# Patient Record
Sex: Male | Born: 1970 | Race: White | Hispanic: No | Smoking: Former smoker
Health system: Southern US, Community
[De-identification: ages and names within clinical notes are randomized; demographics above are authoritative.]

## PROBLEM LIST (undated history)

## (undated) DIAGNOSIS — R0600 Dyspnea, unspecified: Secondary | ICD-10-CM

## (undated) DIAGNOSIS — J189 Pneumonia, unspecified organism: Secondary | ICD-10-CM

## (undated) DIAGNOSIS — I2510 Atherosclerotic heart disease of native coronary artery without angina pectoris: Secondary | ICD-10-CM

## (undated) DIAGNOSIS — I712 Thoracic aortic aneurysm, without rupture: Secondary | ICD-10-CM

## (undated) DIAGNOSIS — I509 Heart failure, unspecified: Secondary | ICD-10-CM

## (undated) DIAGNOSIS — I7121 Aneurysm of the ascending aorta, without rupture: Secondary | ICD-10-CM

## (undated) DIAGNOSIS — F32A Depression, unspecified: Secondary | ICD-10-CM

## (undated) DIAGNOSIS — D649 Anemia, unspecified: Secondary | ICD-10-CM

## (undated) DIAGNOSIS — Z953 Presence of xenogenic heart valve: Secondary | ICD-10-CM

## (undated) DIAGNOSIS — I5032 Chronic diastolic (congestive) heart failure: Secondary | ICD-10-CM

## (undated) DIAGNOSIS — R7303 Prediabetes: Secondary | ICD-10-CM

## (undated) DIAGNOSIS — Z95828 Presence of other vascular implants and grafts: Secondary | ICD-10-CM

## (undated) DIAGNOSIS — G4730 Sleep apnea, unspecified: Secondary | ICD-10-CM

## (undated) DIAGNOSIS — N419 Inflammatory disease of prostate, unspecified: Secondary | ICD-10-CM

## (undated) DIAGNOSIS — F419 Anxiety disorder, unspecified: Secondary | ICD-10-CM

## (undated) DIAGNOSIS — M109 Gout, unspecified: Secondary | ICD-10-CM

## (undated) DIAGNOSIS — J449 Chronic obstructive pulmonary disease, unspecified: Secondary | ICD-10-CM

## (undated) DIAGNOSIS — I10 Essential (primary) hypertension: Secondary | ICD-10-CM

## (undated) DIAGNOSIS — I872 Venous insufficiency (chronic) (peripheral): Secondary | ICD-10-CM

## (undated) DIAGNOSIS — I272 Pulmonary hypertension, unspecified: Secondary | ICD-10-CM

## (undated) DIAGNOSIS — E669 Obesity, unspecified: Secondary | ICD-10-CM

## (undated) DIAGNOSIS — E119 Type 2 diabetes mellitus without complications: Secondary | ICD-10-CM

## (undated) DIAGNOSIS — I2699 Other pulmonary embolism without acute cor pulmonale: Secondary | ICD-10-CM

## (undated) DIAGNOSIS — J45909 Unspecified asthma, uncomplicated: Secondary | ICD-10-CM

## (undated) DIAGNOSIS — I350 Nonrheumatic aortic (valve) stenosis: Secondary | ICD-10-CM

## (undated) DIAGNOSIS — M549 Dorsalgia, unspecified: Secondary | ICD-10-CM

## (undated) DIAGNOSIS — L0390 Cellulitis, unspecified: Secondary | ICD-10-CM

## (undated) DIAGNOSIS — Z720 Tobacco use: Secondary | ICD-10-CM

## (undated) DIAGNOSIS — F329 Major depressive disorder, single episode, unspecified: Secondary | ICD-10-CM

## (undated) DIAGNOSIS — K5900 Constipation, unspecified: Secondary | ICD-10-CM

## (undated) DIAGNOSIS — R011 Cardiac murmur, unspecified: Secondary | ICD-10-CM

## (undated) DIAGNOSIS — N12 Tubulo-interstitial nephritis, not specified as acute or chronic: Secondary | ICD-10-CM

## (undated) DIAGNOSIS — M1990 Unspecified osteoarthritis, unspecified site: Secondary | ICD-10-CM

## (undated) HISTORY — DX: Chronic obstructive pulmonary disease, unspecified: J44.9

## (undated) HISTORY — DX: Essential (primary) hypertension: I10

## (undated) HISTORY — DX: Nonrheumatic aortic (valve) stenosis: I35.0

## (undated) HISTORY — PX: CARDIAC VALVE REPLACEMENT: SHX585

## (undated) HISTORY — DX: Tobacco use: Z72.0

## (undated) HISTORY — DX: Dorsalgia, unspecified: M54.9

## (undated) HISTORY — DX: Venous insufficiency (chronic) (peripheral): I87.2

## (undated) HISTORY — DX: Gout, unspecified: M10.9

## (undated) HISTORY — DX: Inflammatory disease of prostate, unspecified: N41.9

## (undated) HISTORY — PX: NO PAST SURGERIES: SHX2092

## (undated) HISTORY — DX: Constipation, unspecified: K59.00

## (undated) HISTORY — DX: Cellulitis, unspecified: L03.90

## (undated) HISTORY — DX: Thoracic aortic aneurysm, without rupture: I71.2

## (undated) HISTORY — DX: Heart failure, unspecified: I50.9

## (undated) HISTORY — DX: Obesity, unspecified: E66.9

## (undated) HISTORY — DX: Aneurysm of the ascending aorta, without rupture: I71.21

## (undated) HISTORY — DX: Chronic diastolic (congestive) heart failure: I50.32

## (undated) HISTORY — DX: Morbid (severe) obesity due to excess calories: E66.01

---

## 2008-01-08 ENCOUNTER — Emergency Department (HOSPITAL_COMMUNITY): Admission: EM | Admit: 2008-01-08 | Discharge: 2008-01-08 | Payer: Self-pay | Admitting: Emergency Medicine

## 2017-05-09 ENCOUNTER — Encounter: Payer: Self-pay | Admitting: *Deleted

## 2017-05-10 ENCOUNTER — Ambulatory Visit (INDEPENDENT_AMBULATORY_CARE_PROVIDER_SITE_OTHER): Payer: BLUE CROSS/BLUE SHIELD | Admitting: Cardiovascular Disease

## 2017-05-10 ENCOUNTER — Telehealth: Payer: Self-pay | Admitting: Cardiovascular Disease

## 2017-05-10 ENCOUNTER — Encounter: Payer: Self-pay | Admitting: Cardiovascular Disease

## 2017-05-10 ENCOUNTER — Other Ambulatory Visit: Payer: Self-pay

## 2017-05-10 VITALS — BP 180/80 | HR 80 | Ht 71.0 in | Wt >= 6400 oz

## 2017-05-10 DIAGNOSIS — R0789 Other chest pain: Secondary | ICD-10-CM

## 2017-05-10 DIAGNOSIS — I350 Nonrheumatic aortic (valve) stenosis: Secondary | ICD-10-CM

## 2017-05-10 DIAGNOSIS — I10 Essential (primary) hypertension: Secondary | ICD-10-CM | POA: Diagnosis not present

## 2017-05-10 DIAGNOSIS — R011 Cardiac murmur, unspecified: Secondary | ICD-10-CM

## 2017-05-10 DIAGNOSIS — Z720 Tobacco use: Secondary | ICD-10-CM

## 2017-05-10 DIAGNOSIS — I509 Heart failure, unspecified: Secondary | ICD-10-CM | POA: Diagnosis not present

## 2017-05-10 DIAGNOSIS — R0609 Other forms of dyspnea: Secondary | ICD-10-CM

## 2017-05-10 NOTE — Telephone Encounter (Signed)
Echo with contrast at [facility] - Dr. Raliegh Ip request that Reginald [name] does the test. Schedule at [facility]

## 2017-05-10 NOTE — Patient Instructions (Signed)
Medication Instructions:  Continue all current medications.  Labwork: none  Testing/Procedures:  Your physician has requested that you have an echocardiogram. Echocardiography is a painless test that uses sound waves to create images of your heart. It provides your doctor with information about the size and shape of your heart and how well your heart's chambers and valves are working. This procedure takes approximately one hour. There are no restrictions for this procedure. (with contrast)  Office will contact with results via phone or letter.    Follow-Up: 1 month   Any Other Special Instructions Will Be Listed Below (If Applicable).  If you need a refill on your cardiac medications before your next appointment, please call your pharmacy.

## 2017-05-10 NOTE — Progress Notes (Signed)
CARDIOLOGY CONSULT NOTE  Patient ID: ALAKAI MACBRIDE MRN: [id number] DOB/AGE: 46/26/1972 46 y.o.  Admit date: (Not on file) Primary Physician: Monico Blitz, MD Referring Physician: Dr. Manuella Ghazi  Reason for Consultation: Shortness of breath  HPI: Reginald [name] is a 46 y.o. male who is being seen today for the evaluation of shortness of breath at the request of Monico Blitz, MD.   Past medical history includes hypertension and tobacco abuse.  I personally reviewed all relevant documentation, labs, and studies from his PCP.  Echocardiogram performed at an outside facility on 02/19/17 was reportedly a technically difficult study.  It was felt secondary to morbid obesity.  ECG performed on 02/12/17 which I personally interpreted demonstrated sinus rhythm with mild inferior nonspecific T wave abnormalities.  He said he has been short of breath for years.  He has been treated with antibiotics and steroids for COPD.  He said he does not get much relief from this.  He was then placed on a diuretic twice daily and did feel relief about 2 or 3 months ago.  Lately he has not experienced relief with a once daily diuretic.  He has had left leg swelling for the past 2 years.  He said if he does not wear compression stockings and uses diuretic, his left leg become significantly swollen.  He has had some chest pressure this week.  He said he feels like "there is gas in my chest ".  He experiences occasional relief with his inhaler.  He has been on prednisone for the past week.  He said his blood pressure is normally less than 140/90.  It is 180/80 today.  After auscultating him, I asked him if he knew he had a heart murmur.  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  Family history: Brother had a coronary artery stent in his late 61s or early 68s.  His mother had an MI at age 48.   No Known Allergies  Current Outpatient Medications    Medication Sig Dispense Refill  . albuterol (PROVENTIL HFA;VENTOLIN HFA) 108 (90 Base) MCG/ACT inhaler Inhale into the lungs every 6 (six) hours as needed for wheezing or shortness of breath.    . allopurinol (ZYLOPRIM) 300 MG tablet Take 300 mg by mouth daily.    Marland Kitchen amLODipine (NORVASC) 10 MG tablet Take 10 mg by mouth daily.    Marland Kitchen amoxicillin-clavulanate (AUGMENTIN) 875-125 MG tablet Take 1 tablet by mouth 2 (two) times daily.    Marland Kitchen docusate sodium (COLACE) 100 MG capsule Take 100 mg by mouth 2 (two) times daily.    . furosemide (LASIX) 40 MG tablet Take 40 mg by mouth daily.    Marland Kitchen ibuprofen (ADVIL,MOTRIN) 800 MG tablet Take 800 mg by mouth every 8 (eight) hours as needed.    . IPRATROPIUM BROMIDE IN Inhale into the lungs.    Marland Kitchen lisinopril-hydrochlorothiazide (PRINZIDE,ZESTORETIC) 20-25 MG tablet Take 1 tablet by mouth daily.    . potassium chloride (K-DUR,KLOR-CON) 10 MEQ tablet Take 10 mEq by mouth daily.    . predniSONE (DELTASONE) 20 MG tablet Take 20 mg by mouth daily with breakfast.     No current facility-administered medications for this visit.     Past Medical History:  Diagnosis Date  . Back pain   . Cellulitis of skin with lymphangitis   . CHF (congestive heart failure) ([place])   . Constipation   .  COPD (chronic obstructive pulmonary disease) ([place])   . Gout   . Hypertension   . Obesity   . Prostatitis      Social History   Socioeconomic History  . Marital status: Single    Spouse name: Not on file  . Number of children: Not on file  . Years of education: Not on file  . Highest education level: Not on file  Social Needs  . Financial resource strain: Not on file  . Food insecurity - worry: Not on file  . Food insecurity - inability: Not on file  . Transportation needs - medical: Not on file  . Transportation needs - non-medical: Not on file  Occupational History  . Not on file  Tobacco Use  . Smoking status: Current Every Day Smoker    Packs/day: 1.00    Types:  Cigarettes  . Smokeless tobacco: Never Used  Substance and Sexual Activity  . Alcohol use: Not on file  . Drug use: Not on file  . Sexual activity: Not on file  Other Topics Concern  . Not on file  Social History Narrative  . Not on file      Current Meds  Medication Sig  . albuterol (PROVENTIL HFA;VENTOLIN HFA) 108 (90 Base) MCG/ACT inhaler Inhale into the lungs every 6 (six) hours as needed for wheezing or shortness of breath.  . allopurinol (ZYLOPRIM) 300 MG tablet Take 300 mg by mouth daily.  Marland Kitchen amLODipine (NORVASC) 10 MG tablet Take 10 mg by mouth daily.  Marland Kitchen amoxicillin-clavulanate (AUGMENTIN) 875-125 MG tablet Take 1 tablet by mouth 2 (two) times daily.  Marland Kitchen docusate sodium (COLACE) 100 MG capsule Take 100 mg by mouth 2 (two) times daily.  . furosemide (LASIX) 40 MG tablet Take 40 mg by mouth daily.  Marland Kitchen ibuprofen (ADVIL,MOTRIN) 800 MG tablet Take 800 mg by mouth every 8 (eight) hours as needed.  . IPRATROPIUM BROMIDE IN Inhale into the lungs.  Marland Kitchen lisinopril-hydrochlorothiazide (PRINZIDE,ZESTORETIC) 20-25 MG tablet Take 1 tablet by mouth daily.  . potassium chloride (K-DUR,KLOR-CON) 10 MEQ tablet Take 10 mEq by mouth daily.  . predniSONE (DELTASONE) 20 MG tablet Take 20 mg by mouth daily with breakfast.  . [DISCONTINUED] testosterone cypionate (DEPOTESTOSTERONE CYPIONATE) 200 MG/ML injection Inject 200 mg into the muscle every 14 (fourteen) days.      Review of systems complete and found to be negative unless listed above in HPI    Physical exam Blood pressure (!) 180/80, pulse 80, height 5\' 11"  (1.803 m), weight (!) 442 lb (200.5 kg), SpO2 98 %. General: NAD Neck: No JVD, no thyromegaly or thyroid nodule.  Lungs: Clear to auscultation bilaterally with normal respiratory effort. CV: Nondisplaced PMI. Regular rate and rhythm, normal S1/S2, no S3/S4, 3/6 ejection systolic murmur loudest over right upper sternal border.  Wearing compression stockings.    Abdomen: Morbidly  obese Skin: Intact without lesions or rashes.  Neurologic: Alert and oriented x 3.  Psych: Normal affect. Extremities: No clubbing or cyanosis.  HEENT: Normal.   ECG: Most recent ECG reviewed.   Labs: No results found for: K, BUN, CREATININE, ALT, TSH, HGB   Lipids: No results found for: LDLCALC, LDLDIRECT, CHOL, TRIG, HDL      ASSESSMENT AND PLAN:  1.  Progressive exertional dyspnea and chest pressure: I am sure he has some degree of COPD due to years of tobacco abuse as well as restrictive lung disease due to morbid obesity, which is contributing to his symptomatology. However, he also has  a loud murmur over the aortic area.  Upon further discussing with him, he may have congenital bicuspid aortic valve stenosis.  I will obtain an echocardiogram with contrast for further clarification.  I would also like to assess aortic root size given its known association with aortopathy.   Given progressive relief with diuretic use in the past, he likely has some component of CHF.  I will need to determine if this is systolic or diastolic in etiology. He has several risk factors for ischemic heart disease.  I talked to him about obtaining a CT of the coronary arteries with fractional flow reserve.  He is concerned about cost and would like to wait until February 1.  2.  Essential hypertension: Blood pressure is markedly elevated but he says it is usually less than 140/90.  There is likely some contribution from prednisone.  I will monitor this.  3.  Cardiac murmur: See #1.  4.  Tobacco abuse disorder.  5. CHF: Given progressive relief with diuretic use in the past, he likely has some component of CHF.  I will need to determine if this is systolic or diastolic in etiology.    Disposition: Follow up in 1 month  Signed: Kate Sable, M.D., F.A.C.C.  05/10/2017, 10:08 AM

## 2017-05-25 ENCOUNTER — Ambulatory Visit (HOSPITAL_COMMUNITY)
Admission: RE | Admit: 2017-05-25 | Discharge: 2017-05-25 | Disposition: A | Discharge status: Home / Self Care | Payer: BLUE CROSS/BLUE SHIELD | Source: Ambulatory Visit | Attending: Cardiovascular Disease | Admitting: Cardiovascular Disease

## 2017-05-25 DIAGNOSIS — I110 Hypertensive heart disease with heart failure: Secondary | ICD-10-CM | POA: Diagnosis not present

## 2017-05-25 DIAGNOSIS — F17210 Nicotine dependence, cigarettes, uncomplicated: Secondary | ICD-10-CM | POA: Diagnosis not present

## 2017-05-25 DIAGNOSIS — J449 Chronic obstructive pulmonary disease, unspecified: Secondary | ICD-10-CM | POA: Diagnosis not present

## 2017-05-25 DIAGNOSIS — I350 Nonrheumatic aortic (valve) stenosis: Secondary | ICD-10-CM | POA: Diagnosis not present

## 2017-05-25 DIAGNOSIS — I509 Heart failure, unspecified: Secondary | ICD-10-CM | POA: Diagnosis not present

## 2017-05-25 LAB — ECHOCARDIOGRAM COMPLETE
AO mean calculated velocity dopler: 327 cm/s
AOPV: 0.31 m/s
AOVTI: 90.2 cm
AV Area VTI index: 0.36 cm2/m2
AV Area VTI: 1.19 cm2
AV Area mean vel: 0.96 cm2
AV Peak grad: 75 mmHg
AV VEL mean LVOT/AV: 0.28
AV vel: 1.19
AVAREAMEANVIN: 0.29 cm2/m2
AVG: 48 mmHg
AVLVOTPG: 7 mmHg
AVPKVEL: 434 cm/s
CHL CUP AV PEAK INDEX: 0.36
E decel time: 222 msec
E/e' ratio: 12.45
FS: 41 % (ref 28–44)
IV/PV OW: 0.82
LA diam end sys: 43 mm
LA vol: 95.7 mL
LADIAMINDEX: 1.31 cm/m2
LASIZE: 43 mm
LAVOLA4C: 79.5 mL
LAVOLIN: 29.1 mL/m2
LV E/e' medial: 12.45
LV E/e'average: 12.45
LV PW d: 15.4 mm — AB (ref 0.6–1.1)
LVELAT: 8.27 cm/s
LVOT VTI: 28.3 cm
LVOT area: 3.46 cm2
LVOT diameter: 21 mm
LVOTPV: 136 cm/s
LVOTSV: 108 mL
LVOTVTI: 0.31 cm
Lateral S' vel: 17.4 cm/s
MV Dec: 222
MV Peak grad: 4 mmHg
MV pk E vel: 103 m/s
MVPKAVEL: 106 m/s
RV TAPSE: 31.9 mm
TDI e' lateral: 8.27
TDI e' medial: 7.72
Valve area index: 0.36
Valve area: 1.19 cm2

## 2017-05-25 MED ORDER — PERFLUTREN LIPID MICROSPHERE
1.0000 mL | INTRAVENOUS | Status: AC | PRN
  Administered 2017-05-25: 2 mL via INTRAVENOUS
  Administered 2017-05-25: 1 mL via INTRAVENOUS
  Filled 2017-05-25: qty 10

## 2017-05-25 NOTE — Progress Notes (Signed)
*  PRELIMINARY RESULTS* Echocardiogram 2D Echocardiogram with DEFINITY has been performed.  Leavy Cella 05/25/2017, 11:42 AM

## 2017-06-04 ENCOUNTER — Telehealth: Payer: Self-pay | Admitting: *Deleted

## 2017-06-04 NOTE — Telephone Encounter (Signed)
Notes recorded by Laurine Blazer, LPN on 2/48/2500 at 3:70 PM EST Patient notified. Copy to pmd. Follow up scheduled for 06/19/2017 with Dr. Bronson Ing.   ------  Notes recorded by Laurine Blazer, LPN on 4/88/8916 at 9:45 PM EST Left message to return call.  ------  Notes recorded by Laurine Blazer, LPN on 0/07/8880 at 80:03 AM EST 06/19/2017. Is this soon enough? ------  Notes recorded by Herminio Commons, MD on 05/28/2017 at 9:10 AM EST Severe narrowing of aortic valve opening which is what I suspected when I examined him. When is his follow up with me scheduled?

## 2017-06-19 ENCOUNTER — Other Ambulatory Visit: Payer: Self-pay

## 2017-06-19 ENCOUNTER — Ambulatory Visit (INDEPENDENT_AMBULATORY_CARE_PROVIDER_SITE_OTHER): Payer: BLUE CROSS/BLUE SHIELD | Admitting: Cardiovascular Disease

## 2017-06-19 ENCOUNTER — Encounter: Payer: Self-pay | Admitting: *Deleted

## 2017-06-19 ENCOUNTER — Encounter: Payer: Self-pay | Admitting: Cardiovascular Disease

## 2017-06-19 VITALS — BP 130/80 | HR 89 | Ht 71.0 in | Wt >= 6400 oz

## 2017-06-19 DIAGNOSIS — Z720 Tobacco use: Secondary | ICD-10-CM

## 2017-06-19 DIAGNOSIS — R0609 Other forms of dyspnea: Secondary | ICD-10-CM

## 2017-06-19 DIAGNOSIS — R0789 Other chest pain: Secondary | ICD-10-CM | POA: Diagnosis not present

## 2017-06-19 DIAGNOSIS — I10 Essential (primary) hypertension: Secondary | ICD-10-CM

## 2017-06-19 DIAGNOSIS — I350 Nonrheumatic aortic (valve) stenosis: Secondary | ICD-10-CM | POA: Diagnosis not present

## 2017-06-19 DIAGNOSIS — I712 Thoracic aortic aneurysm, without rupture, unspecified: Secondary | ICD-10-CM

## 2017-06-19 MED ORDER — FUROSEMIDE 40 MG PO TABS: 40.0000 mg | ORAL_TABLET | Freq: Every day | ORAL | 1 refills | Status: DC

## 2017-06-19 NOTE — Progress Notes (Signed)
SUBJECTIVE: The patient presents for follow-up after undergoing cardiac testing for progressive exertional dyspnea and chest pressure.  He is morbidly obese.  He also has hypertension and a history of tobacco abuse.  Echocardiogram on 05/25/17 demonstrated normal left ventricular systolic and diastolic function with normal regional wall motion, LVEF 60-65%, moderate LVH, and severe aortic valvular stenosis, mean gradient 48 mmHg, valve area 0.99 cm, with severe dilatation of the proximal ascending aorta, 5.2 cm.  He has been using Lasix 40 mg daily but frequently has had to use the 80 mg for relief of shortness of breath and bilateral leg edema.  Continues to use compression stockings which has helped.  He has been tired and exhausted after day of work.  He drives a truck.  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  He did have palpitations today and attributes this to anxiety.  He has had occasional episodic dizziness but denies syncope.  He is very worried.   Family history: Brother had a coronary artery stent in his late 34s or early 55s.  His mother had an MI at age 45.  Review of Systems: As per "subjective", otherwise negative.  No Known Allergies  Current Outpatient Medications  Medication Sig Dispense Refill  . albuterol (PROVENTIL HFA;VENTOLIN HFA) 108 (90 Base) MCG/ACT inhaler Inhale into the lungs every 6 (six) hours as needed for wheezing or shortness of breath.    . allopurinol (ZYLOPRIM) 300 MG tablet Take 300 mg by mouth daily.    Marland Kitchen amLODipine (NORVASC) 10 MG tablet Take 10 mg by mouth daily.    Marland Kitchen docusate sodium (COLACE) 100 MG capsule Take 100 mg by mouth 2 (two) times daily.    . furosemide (LASIX) 40 MG tablet Take 40 mg by mouth daily.    Marland Kitchen ibuprofen (ADVIL,MOTRIN) 800 MG tablet Take 800 mg by mouth every 8 (eight) hours as needed.    . IPRATROPIUM BROMIDE IN Inhale into the lungs.    Marland Kitchen lisinopril-hydrochlorothiazide (PRINZIDE,ZESTORETIC) 20-25 MG tablet Take 1 tablet by  mouth daily.    . potassium chloride (K-DUR,KLOR-CON) 10 MEQ tablet Take 10 mEq by mouth daily.     No current facility-administered medications for this visit.     Past Medical History:  Diagnosis Date  . Back pain   . Cellulitis of skin with lymphangitis   . CHF (congestive heart failure) ([place])   . Constipation   . COPD (chronic obstructive pulmonary disease) ([place])   . Gout   . Hypertension   . Obesity   . Prostatitis     Past Surgical History:  Procedure Laterality Date  . NO PAST SURGERIES      Social History   Socioeconomic History  . Marital status: Single    Spouse name: Not on file  . Number of children: Not on file  . Years of education: Not on file  . Highest education level: Not on file  Social Needs  . Financial resource strain: Not on file  . Food insecurity - worry: Not on file  . Food insecurity - inability: Not on file  . Transportation needs - medical: Not on file  . Transportation needs - non-medical: Not on file  Occupational History  . Not on file  Tobacco Use  . Smoking status: Current Every Day Smoker    Packs/day: 1.00    Types: Cigarettes  . Smokeless tobacco: Never Used  Substance and Sexual Activity  . Alcohol use: Not on file  . Drug use:  Not on file  . Sexual activity: Not on file  Other Topics Concern  . Not on file  Social History Narrative  . Not on file     Vitals:   06/19/17 1543  BP: 130/80  Pulse: 89  SpO2: 97%  Weight: (!) 445 lb (201.9 kg)  Height: 5\' 11"  (1.803 m)    Wt Readings from Last 3 Encounters:  06/19/17 (!) 445 lb (201.9 kg)  05/10/17 (!) 442 lb (200.5 kg)  04/23/17 (!) 443 lb (200.9 kg)     PHYSICAL EXAM General: NAD HEENT: Normal. Neck: No JVD, no thyromegaly. Lungs: Diminished sounds throughout, no crackles or wheezes. CV: Regular rate and rhythm, normal S1/diminished S2, no S3/S4, 3/6 ejection systolic murmur loudest over right upper sternal border.  Wearing compression stockings.  No  significant bilateral leg edema.  Abdomen: Morbidly obese. Neurologic: Alert and oriented.  Psych: Normal affect.  Anxious. Skin: Normal. Musculoskeletal: No gross deformities.    ECG: Most recent ECG reviewed.   Labs: No results found for: K, BUN, CREATININE, ALT, TSH, HGB   Lipids: No results found for: LDLCALC, LDLDIRECT, CHOL, TRIG, HDL     ASSESSMENT AND PLAN: 1.  Severe symptomatic aortic stenosis: I suspect he has congenital bicuspid aortic valve stenosis.  He will require aortic valve replacement.  Given his morbid obesity and probable concomitant lung disease given his long history of tobacco abuse, I am uncertain if he would be an optimal candidate for open surgical valve replacement.  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  I will have him evaluated by CT surgery.   He will need pulmonary function testing and right and left heart catheterization with coronary angiography.  This will be deferred until he has been evaluated by CT surgery. He is currently on Lasix 40 mg daily and frequently takes 80 mg.  Both systolic and diastolic function were normal as detailed above. I will provide him with a letter keeping him off of work until the situation is resolved.  2.  Severe proximal ascending aortic dilatation: I will have him evaluated by CT surgery.  See discussion under #1.  3.  Chronic hypertension with hypertensive heart disease: Blood pressure is controlled.  No changes.  Currently on amlodipine 10 mg, lisinopril 20 mg, hydrochlorothiazide 25 mg, and Lasix 40 mg daily.  4.  Tobacco abuse disorder.     Disposition: Follow up with CT surgery in the near future.  Follow-up with me in 3 months.  Time spent: 40 minutes, of which greater than 50% was spent reviewing symptoms, relevant blood tests and studies, and discussing  management plan with the patient.   Time spent: 40 minutes, of which greater than 50% was spent reviewing symptoms, relevant blood tests and studies, and discussing management plan with the patient.    Kate Sable, M.D., F.A.C.C.

## 2017-06-19 NOTE — H&P (View-Only) (Signed)
SUBJECTIVE: The patient presents for follow-up after undergoing cardiac testing for progressive exertional dyspnea and chest pressure.  He is morbidly obese.  He also has hypertension and a history of tobacco abuse.  Echocardiogram on 05/25/17 demonstrated normal left ventricular systolic and diastolic function with normal regional wall motion, LVEF 60-65%, moderate LVH, and severe aortic valvular stenosis, mean gradient 48 mmHg, valve area 0.99 cm, with severe dilatation of the proximal ascending aorta, 5.2 cm.  He has been using Lasix 40 mg daily but frequently has had to use the 80 mg for relief of shortness of breath and bilateral leg edema.  Continues to use compression stockings which has helped.  He has been tired and exhausted after day of work.  He drives a truck.  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  He did have palpitations today and attributes this to anxiety.  He has had occasional episodic dizziness but denies syncope.  He is very worried.   Family history: Brother had a coronary artery stent in his late 22s or early 58s.  His mother had an MI at age 47.  Review of Systems: As per "subjective", otherwise negative.  No Known Allergies  Current Outpatient Medications  Medication Sig Dispense Refill  . albuterol (PROVENTIL HFA;VENTOLIN HFA) 108 (90 Base) MCG/ACT inhaler Inhale into the lungs every 6 (six) hours as needed for wheezing or shortness of breath.    . allopurinol (ZYLOPRIM) 300 MG tablet Take 300 mg by mouth daily.    Marland Kitchen amLODipine (NORVASC) 10 MG tablet Take 10 mg by mouth daily.    Marland Kitchen docusate sodium (COLACE) 100 MG capsule Take 100 mg by mouth 2 (two) times daily.    . furosemide (LASIX) 40 MG tablet Take 40 mg by mouth daily.    Marland Kitchen ibuprofen (ADVIL,MOTRIN) 800 MG tablet Take 800 mg by mouth every 8 (eight) hours as needed.    . IPRATROPIUM BROMIDE IN Inhale into the lungs.    Marland Kitchen lisinopril-hydrochlorothiazide (PRINZIDE,ZESTORETIC) 20-25 MG tablet Take 1 tablet by  mouth daily.    . potassium chloride (K-DUR,KLOR-CON) 10 MEQ tablet Take 10 mEq by mouth daily.     No current facility-administered medications for this visit.     Past Medical History:  Diagnosis Date  . Back pain   . Cellulitis of skin with lymphangitis   . CHF (congestive heart failure) ([place])   . Constipation   . COPD (chronic obstructive pulmonary disease) ([place])   . Gout   . Hypertension   . Obesity   . Prostatitis     Past Surgical History:  Procedure Laterality Date  . NO PAST SURGERIES      Social History   Socioeconomic History  . Marital status: Single    Spouse name: Not on file  . Number of children: Not on file  . Years of education: Not on file  . Highest education level: Not on file  Social Needs  . Financial resource strain: Not on file  . Food insecurity - worry: Not on file  . Food insecurity - inability: Not on file  . Transportation needs - medical: Not on file  . Transportation needs - non-medical: Not on file  Occupational History  . Not on file  Tobacco Use  . Smoking status: Current Every Day Smoker    Packs/day: 1.00    Types: Cigarettes  . Smokeless tobacco: Never Used  Substance and Sexual Activity  . Alcohol use: Not on file  . Drug use:  Not on file  . Sexual activity: Not on file  Other Topics Concern  . Not on file  Social History Narrative  . Not on file     Vitals:   06/19/17 1543  BP: 130/80  Pulse: 89  SpO2: 97%  Weight: (!) 445 lb (201.9 kg)  Height: 5\' 11"  (1.803 m)    Wt Readings from Last 3 Encounters:  06/19/17 (!) 445 lb (201.9 kg)  05/10/17 (!) 442 lb (200.5 kg)  04/23/17 (!) 443 lb (200.9 kg)     PHYSICAL EXAM General: NAD HEENT: Normal. Neck: No JVD, no thyromegaly. Lungs: Diminished sounds throughout, no crackles or wheezes. CV: Regular rate and rhythm, normal S1/diminished S2, no S3/S4, 3/6 ejection systolic murmur loudest over right upper sternal border.  Wearing compression stockings.  No  significant bilateral leg edema.  Abdomen: Morbidly obese. Neurologic: Alert and oriented.  Psych: Normal affect.  Anxious. Skin: Normal. Musculoskeletal: No gross deformities.    ECG: Most recent ECG reviewed.   Labs: No results found for: K, BUN, CREATININE, ALT, TSH, HGB   Lipids: No results found for: LDLCALC, LDLDIRECT, CHOL, TRIG, HDL     ASSESSMENT AND PLAN: 1.  Severe symptomatic aortic stenosis: I suspect he has congenital bicuspid aortic valve stenosis.  He will require aortic valve replacement.  Given his morbid obesity and probable concomitant lung disease given his long history of tobacco abuse, I am uncertain if he would be an optimal candidate for open surgical valve replacement.  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  I will have him evaluated by CT surgery.   He will need pulmonary function testing and right and left heart catheterization with coronary angiography.  This will be deferred until he has been evaluated by CT surgery. He is currently on Lasix 40 mg daily and frequently takes 80 mg.  Both systolic and diastolic function were normal as detailed above. I will provide him with a letter keeping him off of work until the situation is resolved.  2.  Severe proximal ascending aortic dilatation: I will have him evaluated by CT surgery.  See discussion under #1.  3.  Chronic hypertension with hypertensive heart disease: Blood pressure is controlled.  No changes.  Currently on amlodipine 10 mg, lisinopril 20 mg, hydrochlorothiazide 25 mg, and Lasix 40 mg daily.  4.  Tobacco abuse disorder.     Disposition: Follow up with CT surgery in the near future.  Follow-up with me in 3 months.  Time spent: 40 minutes, of which greater than 50% was spent reviewing symptoms, relevant blood tests and studies, and discussing  management plan with the patient.   Time spent: 40 minutes, of which greater than 50% was spent reviewing symptoms, relevant blood tests and studies, and discussing management plan with the patient.    Kate Sable, M.D., F.A.C.C.

## 2017-06-19 NOTE — Patient Instructions (Addendum)
Your physician recommends that you schedule a follow-up appointment in: [place] physician has recommended you make the following change in your medication:   YOU MAY TAKE AN EXTRA 40 MG OF LASIX AS NEEDED - NEW RX SENT TO PHARMACY   WE ARE REFERRING YOU TO CT SURGERY   Thank you for choosing [facility]!!

## 2017-07-05 ENCOUNTER — Institutional Professional Consult (permissible substitution) (INDEPENDENT_AMBULATORY_CARE_PROVIDER_SITE_OTHER): Payer: BLUE CROSS/BLUE SHIELD | Admitting: Thoracic Surgery (Cardiothoracic Vascular Surgery)

## 2017-07-05 ENCOUNTER — Other Ambulatory Visit: Payer: Self-pay

## 2017-07-05 ENCOUNTER — Encounter: Payer: Self-pay | Admitting: Thoracic Surgery (Cardiothoracic Vascular Surgery)

## 2017-07-05 ENCOUNTER — Ambulatory Visit (HOSPITAL_COMMUNITY)
Admission: RE | Admit: 2017-07-05 | Discharge: 2017-07-05 | Disposition: A | Discharge status: Home / Self Care | Payer: BLUE CROSS/BLUE SHIELD | Source: Ambulatory Visit | Attending: Thoracic Surgery (Cardiothoracic Vascular Surgery) | Admitting: Thoracic Surgery (Cardiothoracic Vascular Surgery)

## 2017-07-05 ENCOUNTER — Other Ambulatory Visit: Payer: Self-pay | Admitting: *Deleted

## 2017-07-05 VITALS — BP 148/84 | HR 95 | Resp 18 | Ht 71.0 in | Wt >= 6400 oz

## 2017-07-05 DIAGNOSIS — I350 Nonrheumatic aortic (valve) stenosis: Secondary | ICD-10-CM

## 2017-07-05 DIAGNOSIS — I5033 Acute on chronic diastolic (congestive) heart failure: Secondary | ICD-10-CM

## 2017-07-05 DIAGNOSIS — I10 Essential (primary) hypertension: Secondary | ICD-10-CM | POA: Insufficient documentation

## 2017-07-05 DIAGNOSIS — Z720 Tobacco use: Secondary | ICD-10-CM | POA: Insufficient documentation

## 2017-07-05 DIAGNOSIS — I7121 Aneurysm of the ascending aorta, without rupture: Secondary | ICD-10-CM | POA: Insufficient documentation

## 2017-07-05 DIAGNOSIS — I712 Thoracic aortic aneurysm, without rupture: Secondary | ICD-10-CM | POA: Insufficient documentation

## 2017-07-05 DIAGNOSIS — I872 Venous insufficiency (chronic) (peripheral): Secondary | ICD-10-CM | POA: Insufficient documentation

## 2017-07-05 DIAGNOSIS — I5032 Chronic diastolic (congestive) heart failure: Secondary | ICD-10-CM | POA: Insufficient documentation

## 2017-07-05 LAB — BLOOD GAS, ARTERIAL
Acid-Base Excess: 3.9 mmol/L — ABNORMAL HIGH (ref 0.0–2.0)
BICARBONATE: 27.3 mmol/L (ref 20.0–28.0)
Drawn by: 275531
FIO2: 0.21
O2 Saturation: 97 %
PATIENT TEMPERATURE: 98.6
PCO2 ART: 36.2 mmHg (ref 32.0–48.0)
PO2 ART: 81.4 mmHg — AB (ref 83.0–108.0)
pH, Arterial: 7.49 — ABNORMAL HIGH (ref 7.350–7.450)

## 2017-07-05 NOTE — Patient Instructions (Addendum)
Continue all previous medications without any changes at this time  Stop smoking immediately and permanently.  You have been advised of the numerous benefits associated with regular exercise and weight loss.  The long term benefits for your overall health and wellbeing cannot be overestimated.

## 2017-07-05 NOTE — Progress Notes (Signed)
[facility]  MULTIDISCIPLINARY HEART VALVE CLINIC  CARDIOTHORACIC SURGERY CONSULTATION REPORT  Referring Provider is Herminio Commons, MD PCP is Monico Blitz, MD  Chief Complaint  Patient presents with  . Aortic Stenosis    New patient, consultation, ECHO 05/25/2017    HPI:  Patient is a 47 year old morbidly obese male with long-standing history of heart murmur, chronic diastolic congestive heart failure, hypertension, chronic venous insufficiency, obstructive sleep apnea on CPAP, and long-standing tobacco abuse with COPD who has been referred for surgical consultation to discuss treatment options for management of suspected bicuspid aortic valve with severe aortic stenosis.  Patient states that he has been told he had a heart murmur essentially all of his life and he was told in the past that he probably had a bicuspid aortic valve.  He describes a long history of symptoms of exertional shortness of breath which have progressed substantially over the past 6 months.  He was referred for cardiology consultation and initially seen in consultation by Dr. Bronson Ing on May 10, 2017.  He underwent transthoracic echocardiogram on May 25, 2017.  Echocardiogram was reported to demonstrate the presence of severe aortic stenosis with normal left ventricular systolic function.  Cardiothoracic surgical consultation was requested.  Patient is single and lives with his nephew in [place].  He works full-time as a Administrator on a local route.  He describes a long history of symptoms of exertional shortness of breath and fatigue which has progressed substantially over the past 6 months.  He now gets short of breath with minimal activity and occasionally at rest.  He has developed some exertional tightness across his chest.  He has had dizzy spells without syncope.  He has had chronic severe swelling in both lower legs with previous history of transudate of drainage through  the skin of both legs related to severe chronic venous insufficiency.  Lower extremity swelling have improved with compression stockings and diuretic therapy.  He has been morbidly obese for all of his adult life.  He states that his weight has gotten up as high as 460-470 pounds.  He is not sure what he weighs presently.  His mobility is limited by severe chronic fatigue and exertional shortness of breath.  He does not have significant arthritis or arthralgias.  He still works full-time driving his truck.  This does not require any lifting including no loading or unloading of the truck.  Past Medical History:  Diagnosis Date  . Aortic stenosis   . Back pain   . Cellulitis of skin with lymphangitis   . CHF (congestive heart failure) ([place])   . Chronic diastolic congestive heart failure ([place])   . Chronic venous insufficiency   . Constipation   . COPD (chronic obstructive pulmonary disease) ([place])   . Essential hypertension   . Gout   . Hypertension   . Morbid obesity ([place])   . Obesity   . Prostatitis   . Thoracic ascending aortic aneurysm ([place])   . Tobacco abuse     Past Surgical History:  Procedure Laterality Date  . NO PAST SURGERIES      Family History  Problem Relation Age of Onset  . Hypertension Mother   . Alzheimer's disease Mother   . Heart attack Brother   . Diabetes Brother     Social History   Socioeconomic History  . Marital status: Single    Spouse name: Not on file  . Number of children: Not on file  .  Years of education: Not on file  . Highest education level: Not on file  Social Needs  . Financial resource strain: Not on file  . Food insecurity - worry: Not on file  . Food insecurity - inability: Not on file  . Transportation needs - medical: Not on file  . Transportation needs - non-medical: Not on file  Occupational History  . Not on file  Tobacco Use  . Smoking status: Current Every Day Smoker    Packs/day: 1.00    Types: Cigarettes  . Smokeless  tobacco: Never Used  Substance and Sexual Activity  . Alcohol use: Not on file  . Drug use: Not on file  . Sexual activity: Not on file  Other Topics Concern  . Not on file  Social History Narrative  . Not on file    Current Outpatient Medications  Medication Sig Dispense Refill  . albuterol (PROVENTIL HFA;VENTOLIN HFA) 108 (90 Base) MCG/ACT inhaler Inhale into the lungs every 6 (six) hours as needed for wheezing or shortness of breath.    . allopurinol (ZYLOPRIM) 300 MG tablet Take 300 mg by mouth daily.    Marland Kitchen amLODipine (NORVASC) 10 MG tablet Take 10 mg by mouth daily.    . citalopram (CELEXA) 10 MG tablet Take 10 mg by mouth at bedtime.  2  . docusate sodium (COLACE) 100 MG capsule Take 100 mg by mouth 2 (two) times daily.    . furosemide (LASIX) 40 MG tablet Take 1 tablet (40 mg total) by mouth daily. MAY TAKE AN EXTRA 40 MG AS NEEDED 180 tablet 1  . ibuprofen (ADVIL,MOTRIN) 800 MG tablet Take 800 mg by mouth every 8 (eight) hours as needed.    . IPRATROPIUM BROMIDE IN Inhale into the lungs.    Marland Kitchen lisinopril-hydrochlorothiazide (PRINZIDE,ZESTORETIC) 20-25 MG tablet Take 1 tablet by mouth daily.    . potassium chloride (K-DUR,KLOR-CON) 10 MEQ tablet Take 10 mEq by mouth daily.    . clonazePAM (KLONOPIN) 1 MG tablet TAKE 1/2 TO 1 TABLET BY MOUTH EVERY 8 HOURS AS NEEDED FOR ANXIETY  2   No current facility-administered medications for this visit.     No Known Allergies    Review of Systems:   General:  normal appetite, decreased energy, + weight gain, + weight loss, no fever  Cardiac:  + chest pain with exertion, no chest pain at rest, + SOB with exertion, + occasional resting SOB, no PND, + orthopnea, no palpitations, no arrhythmia, no atrial fibrillation, + LE edema, + dizzy spells, no syncope  Respiratory:  + chronic shortness of breath, no home oxygen, no productive cough, no dry cough, + bronchitis, no wheezing, no hemoptysis, + asthma, no pain with inspiration or cough, +  sleep apnea, + CPAP at night  GI:   no difficulty swallowing, no reflux, no frequent heartburn, no hiatal hernia, no abdominal pain, + constipation, no diarrhea, no hematochezia, no hematemesis, no melena  GU:   no dysuria,  + frequency, no urinary tract infection, no hematuria, no enlarged prostate, no kidney stones, no kidney disease  Vascular:  no pain suggestive of claudication, no pain in feet, no leg cramps, no varicose veins, no DVT, no non-healing foot ulcer  Neuro:   no stroke, no TIA's, no seizures, no headaches, no temporary blindness one eye,  no slurred speech, no peripheral neuropathy, no chronic pain, no instability of gait, no memory/cognitive dysfunction  Musculoskeletal: no arthritis, no joint swelling, no myalgias, no difficulty walking, normal mobility  Skin:   no rash, no itching, no skin infections, no pressure sores or ulcerations  Psych:   no anxiety, no depression, no nervousness, no unusual recent stress  Eyes:   no blurry vision, no floaters, no recent vision changes, + wears glasses or contacts  ENT:   no hearing loss, poor dentition but no loose or painful teeth, no dentures, last saw dentist many years ago  Hematologic:  no easy bruising, no abnormal bleeding, no clotting disorder, no frequent epistaxis  Endocrine:  no diabetes, does not check CBG's at home           Physical Exam:   BP (!) 148/84 (BP Location: Right Arm, Patient Position: Sitting, Cuff Size: Large) Comment (Cuff Size): forearm  Pulse 95   Resp 18   Ht 5\' 11"  (1.803 m)   Wt (!) 436 lb 12.8 oz (198.1 kg)   SpO2 97% Comment: RA  BMI 60.92 kg/m   General:  Morbidly obese male NAD   HEENT:  Unremarkable   Neck:   no JVD, no bruits, no adenopathy   Chest:   clear to auscultation, symmetrical breath sounds, no wheezes, no rhonchi   CV:   Distant heart sounds, RRR, grade II/VI crescendo/decrescendo murmur heard best at RSB,  no diastolic murmur  Abdomen:  soft, non-tender, no masses    Extremities:  warm, adequately-perfused, pulses not palpable, + bilateral LE edema  Rectal/GU  Deferred  Neuro:   Grossly non-focal and symmetrical throughout  Skin:   Clean and dry, no rashes, no breakdown, + changes of chronic venous insufficiency both lower legs   Diagnostic Tests:  Transthoracic Echocardiography  Patient:    Ramone, Gander MR #:       [id number] Study Date: 05/25/2017 Gender:     M Age:        66 Height:     180.3 cm Weight:     200.5 kg BSA:        3.29 m^2 Pt. Status: Room:   SONOGRAPHER  [facility]  ATTENDING    Kate Sable, MD  [place], MD  [place], MD  PERFORMING   Rayford Halsted  cc:  ------------------------------------------------------------------- LV EF: 60% -   65%  ------------------------------------------------------------------- Indications:      Aortic stenosis 424.1.  ------------------------------------------------------------------- History:   PMH:  30 MIN POST DEFINITY BP:126/82. Possible Bicuspid Aortic Valve.  Congestive heart failure.  Chronic obstructive pulmonary disease.  Risk factors:  Current tobacco use. Hypertension.  ------------------------------------------------------------------- Study Conclusions  - Left ventricle: The cavity size was normal. Wall thickness was   increased in a pattern of moderate LVH. Systolic function was   normal. The estimated ejection fraction was in the range of 60%   to 65%. Wall motion was normal; there were no regional wall   motion abnormalities. Left ventricular diastolic function   parameters were normal. - Aortic valve: There was severe stenosis. Mean gradient (S): 48 mm   Hg. Valve area (VTI): 0.99 cm^2. Valve area (Vmax): 0.98 cm^2.   Valve area (Vmean): 0.88 cm^2. - Aorta: The visualized portion of the proximal ascending aorts is   severely dilated at 5.2 cm. Recommend CTA or MRA to better   evaluate. -  Technically difficult study, echocontrast was used to enhance   visualization.  ------------------------------------------------------------------- Study data:  Comparison was made to the study of 02/19/2017.  Study status:  Routine.  Procedure:  Transthoracic echocardiography. Image  quality was poor. The study was technically difficult, as a result of body habitus. Intravenous contrast (Definity) was administered.  Study completion:  There were no complications.     Transthoracic echocardiography.  M-mode, complete 2D, spectral Doppler, and color Doppler.  Birthdate:  Patient birthdate: 1970-05-24.  Age:  Patient is 47 yr old.  Sex:  Gender: male. BMI: 61.7 kg/m^2.  Blood pressure:     111/73  Patient status: Outpatient.  Study date:  Study date: 05/25/2017. Study time: 09:44 AM.  Location:  Echo laboratory.  -------------------------------------------------------------------  ------------------------------------------------------------------- Left ventricle:  The cavity size was normal. Wall thickness was increased in a pattern of moderate LVH. Systolic function was normal. The estimated ejection fraction was in the range of 60% to 65%. Wall motion was normal; there were no regional wall motion abnormalities. Left ventricular diastolic function parameters were normal.  ------------------------------------------------------------------- Aortic valve:  Poorly visualized. Uncertain number of leaflets. Doppler:   There was severe stenosis.   There was no significant regurgitation.    VTI ratio of LVOT to aortic valve: 0.31. Valve area (VTI): 0.99 cm^2. Indexed valve area (VTI): 0.3 cm^2/m^2. Peak velocity ratio of LVOT to aortic valve: 0.31. Valve area (Vmax): 0.98 cm^2. Indexed valve area (Vmax): 0.3 cm^2/m^2. Mean velocity ratio of LVOT to aortic valve: 0.28. Valve area (Vmean): 0.88 cm^2. Indexed valve area (Vmean): 0.27 cm^2/m^2.    Mean gradient (S): 48 mm Hg. Peak gradient  (S): 75 mm Hg.  ------------------------------------------------------------------- Aorta:  The visualized portion of the proximal ascending aorts is severely dilated at 5.2 cm. Recommend CTA or MRA to better evaluate. Aortic root: The aortic root was normal in size.  ------------------------------------------------------------------- Mitral valve:   Normal thickness leaflets .  Doppler:   There was no evidence for stenosis.   There was no significant regurgitation.    Peak gradient (D): 4 mm Hg.  ------------------------------------------------------------------- Left atrium:  The atrium was normal in size.  ------------------------------------------------------------------- Right ventricle:  Poorly visualized. Grossly appears normal in size and function. Systolic function was normal.  ------------------------------------------------------------------- Pulmonic valve:   Not well visualized.  Doppler:   There was no evidence for stenosis.   There was no significant regurgitation.   ------------------------------------------------------------------- Tricuspid valve:   Normal thickness leaflets.  Doppler:   There was no evidence for stenosis.   There was no significant regurgitation.   ------------------------------------------------------------------- Pulmonary artery:    Systolic pressure could not be accurately estimated.   Inadequate TR jet.  ------------------------------------------------------------------- Right atrium:  The atrium was normal in size.  ------------------------------------------------------------------- Pericardium:  There was no pericardial effusion.  ------------------------------------------------------------------- Systemic veins: Inferior vena cava: The vessel was dilated. The respirophasic diameter changes were blunted (< 50%), consistent with elevated central venous  pressure.  ------------------------------------------------------------------- Post procedure conclusions Ascending Aorta:  - The visualized portion of the proximal ascending aorts is   severely dilated at 5.2 cm. Recommend CTA or MRA to better   evaluate.  ------------------------------------------------------------------- Measurements   Left ventricle                            Value          Reference  LV ID, ED, PLAX chordal           (H)     53    mm       43 - 52  LV ID, ES, PLAX chordal  31.2  mm       23 - 38  LV fx shortening, PLAX chordal            41    %        >=29  LV PW thickness, ED                       12    mm       ---------  IVS/LV PW ratio, ED                       1.05           <=1.3    Ventricular septum                        Value          Reference  IVS thickness, ED                         12.6  mm       ---------    LVOT                                      Value          Reference  LVOT ID, S                                20    mm       ---------  LVOT area                                 3.14  cm^2     ---------  LVOT peak velocity, S                     136   cm/s     ---------  LVOT mean velocity, S                     91.2  cm/s     ---------  LVOT VTI, S                               28.3  cm       ---------  LVOT peak gradient, S                     7     mm Hg    ---------    Aortic valve                              Value          Reference  Aortic valve peak velocity, S             434   cm/s     ---------  Aortic valve mean velocity, S             327   cm/s     ---------  Aortic valve VTI, S  90.2  cm       ---------  Aortic mean gradient, S                   48    mm Hg    ---------  Aortic peak gradient, S                   75    mm Hg    ---------  VTI ratio, LVOT/AV                        0.31           ---------  Aortic valve area, VTI                    0.99  cm^2     ---------  Aortic  valve area/bsa, VTI                0.3   cm^2/m^2 ---------  Velocity ratio, peak, LVOT/AV             0.31           ---------  Aortic valve area, peak velocity          0.98  cm^2     ---------  Aortic valve area/bsa, peak               0.3   cm^2/m^2 ---------  velocity  Velocity ratio, mean, LVOT/AV             0.28           ---------  Aortic valve area, mean velocity          0.88  cm^2     ---------  Aortic valve area/bsa, mean               0.27  cm^2/m^2 ---------  velocity    Aorta                                     Value          Reference  Aortic root ID, ED                        34    mm       ---------    Left atrium                               Value          Reference  LA ID, A-P, ES                            43    mm       ---------  LA volume/bsa, ES, 1-p A4C                24.2  ml/m^2   ---------    Mitral valve                              Value          Reference  Mitral E-wave peak velocity  103   cm/s     ---------  Mitral A-wave peak velocity               106   cm/s     ---------  Mitral deceleration time                  222   ms       150 - 230  Mitral peak gradient, D                   4     mm Hg    ---------  Mitral E/A ratio, peak                    1              ---------    Right ventricle                           Value          Reference  TAPSE                                     31.9  mm       ---------  RV s&', lateral, S                         17.4  cm/s     ---------  Legend: (L)  and  (H)  mark values outside specified reference range.  ------------------------------------------------------------------- Prepared and Electronically Authenticated by  Kerry Hough, M.D. 2019-01-04T16:15:43   Impression:  Patient has long-standing history of symptoms of exertional shortness of breath, fatigue, and lower extremity edema distant with chronic diastolic congestive heart failure.  Symptoms have progressed substantially  over the last few months, and the patient now gets short of breath with minimal activity and occasionally at rest.  I have personally reviewed the transthoracic echocardiogram performed recently at [facility].  There is extremely poor sound transmission.  The aortic valve is not well visualized but findings are suggestive of the presence of possible bicuspid aortic valve with severe aortic stenosis.  Peak velocity across the aortic valve and left ventricular outflow tract was measured greater than 4 m/s corresponding to likely severe aortic stenosis.  Left ventricular systolic function appears preserved.  The ascending aorta appears mildly dilated.  Although findings on this echocardiogram are highly suggestive of severe aortic stenosis, sound transmission is so poor that it would be impossible to make definitive recommendations regarding the patient's management.  Because of the patient's extreme morbid obesity and likely significant problems with chronic respiratory failure related to possible obesity hypoventilation syndrome, obstructive sleep apnea COPD with active tobacco abuse, risks associated with conventional surgery might be very high.    Plan:  As a next step the patient should undergo transesophageal echocardiogram to better assess functional anatomy and severity of aortic stenosis.  Left and right heart catheterization could be performed at the same time, these tests will certainly be necessary for any definitive decisions could be made.  We will make arrangements for these to be performed in the near future.  We will also obtain cardiac gated CT angiogram of the heart and CT angiogram of the aorta and iliac vessels to accurately size the ascending thoracic aorta and evaluate the feasibility of transcatheter  aortic valve replacement as an alternative to conventional surgery if the patient's size and/or respiratory function would preclude safe surgery using conventional techniques.  The patient will  undergo formal pulmonary function testing.  He will also be referred for dental service consultation.  The patient will return to our office to review the results of these tests in 3-4 weeks.  The necessity of smoking cessation and weight loss have been emphasized.  All questions answered.   I spent in excess of 90 minutes during the conduct of this office consultation and >50% of this time involved direct face-to-face encounter with the patient for counseling and/or coordination of their care.   Valentina Gu. Roxy Manns, MD 07/05/2017 3:20 PM

## 2017-07-06 LAB — COMPREHENSIVE METABOLIC PANEL
AG Ratio: 1.2 (calc) (ref 1.0–2.5)
ALBUMIN MSPROF: 4.2 g/dL (ref 3.6–5.1)
ALT: 23 U/L (ref 9–46)
AST: 22 U/L (ref 10–40)
Alkaline phosphatase (APISO): 82 U/L (ref 40–115)
BUN: 18 mg/dL (ref 7–25)
CHLORIDE: 100 mmol/L (ref 98–110)
CO2: 26 mmol/L (ref 20–32)
CREATININE: 0.95 mg/dL (ref 0.60–1.35)
Calcium: 9.4 mg/dL (ref 8.6–10.3)
GLOBULIN: 3.5 g/dL (ref 1.9–3.7)
GLUCOSE: 109 mg/dL (ref 65–139)
POTASSIUM: 3.8 mmol/L (ref 3.5–5.3)
Sodium: 138 mmol/L (ref 135–146)
Total Bilirubin: 0.6 mg/dL (ref 0.2–1.2)
Total Protein: 7.7 g/dL (ref 6.1–8.1)

## 2017-07-06 LAB — CBC
HEMATOCRIT: 46.5 % (ref 38.5–50.0)
HEMOGLOBIN: 15.6 g/dL (ref 13.2–17.1)
MCH: 26.4 pg — ABNORMAL LOW (ref 27.0–33.0)
MCHC: 33.5 g/dL (ref 32.0–36.0)
MCV: 78.5 fL — AB (ref 80.0–100.0)
MPV: 9.9 fL (ref 7.5–12.5)
Platelets: 369 10*3/uL (ref 140–400)
RBC: 5.92 10*6/uL — ABNORMAL HIGH (ref 4.20–5.80)
RDW: 15.6 % — ABNORMAL HIGH (ref 11.0–15.0)
WBC: 14 10*3/uL — ABNORMAL HIGH (ref 3.8–10.8)

## 2017-07-06 LAB — HEMOGLOBIN A1C
Hgb A1c MFr Bld: 5.8 % of total Hgb — ABNORMAL HIGH (ref <5.7)
MEAN PLASMA GLUCOSE: 120 (calc)
eAG (mmol/L): 6.6 (calc)

## 2017-07-10 ENCOUNTER — Other Ambulatory Visit: Payer: Self-pay

## 2017-07-10 DIAGNOSIS — I359 Nonrheumatic aortic valve disorder, unspecified: Secondary | ICD-10-CM

## 2017-07-10 DIAGNOSIS — I350 Nonrheumatic aortic (valve) stenosis: Secondary | ICD-10-CM

## 2017-07-12 ENCOUNTER — Telehealth: Payer: Self-pay | Admitting: *Deleted

## 2017-07-12 NOTE — Telephone Encounter (Signed)
Pt contacted pre- TEE/catheterization scheduled at [facility], [place] for: Monday February 25,2019 10 AM for TEE/cath 1:30 PM. Verified arrival time and place: [place] at: arrive 8:30 AM Do not eat or drink after midnight the night before procedures. Verified no known allergies. Verified no diabetes medications  Hold: Lasix am of procedures Lisinopril-HCTZ am of procedures.  Except hold medications AM meds can be  taken pre-cath with sip of water including: ASA 81 mg am of procedures   Confirmed patient has responsible person to drive home post procedure and observe patient for 24 hours: yes

## 2017-07-16 ENCOUNTER — Ambulatory Visit (HOSPITAL_COMMUNITY): Payer: BLUE CROSS/BLUE SHIELD | Admitting: Anesthesiology

## 2017-07-16 ENCOUNTER — Ambulatory Visit (HOSPITAL_BASED_OUTPATIENT_CLINIC_OR_DEPARTMENT_OTHER)
Admission: RE | Admit: 2017-07-16 | Discharge: 2017-07-16 | Disposition: A | Discharge status: Home / Self Care | Payer: BLUE CROSS/BLUE SHIELD | Source: Ambulatory Visit | Attending: Cardiology | Admitting: Cardiology

## 2017-07-16 ENCOUNTER — Encounter (HOSPITAL_COMMUNITY)
Admission: RE | Disposition: A | Discharge status: Home / Self Care | Payer: Self-pay | Source: Ambulatory Visit | Attending: Cardiology

## 2017-07-16 ENCOUNTER — Encounter (HOSPITAL_COMMUNITY): Payer: Self-pay | Admitting: *Deleted

## 2017-07-16 ENCOUNTER — Ambulatory Visit (HOSPITAL_COMMUNITY)
Admission: RE | Admit: 2017-07-16 | Discharge: 2017-07-16 | Disposition: A | Discharge status: Home / Self Care | Payer: BLUE CROSS/BLUE SHIELD | Source: Ambulatory Visit | Attending: Cardiology | Admitting: Cardiology

## 2017-07-16 ENCOUNTER — Other Ambulatory Visit: Payer: Self-pay

## 2017-07-16 DIAGNOSIS — Z6841 Body Mass Index (BMI) 40.0 and over, adult: Secondary | ICD-10-CM | POA: Insufficient documentation

## 2017-07-16 DIAGNOSIS — G4733 Obstructive sleep apnea (adult) (pediatric): Secondary | ICD-10-CM | POA: Insufficient documentation

## 2017-07-16 DIAGNOSIS — I739 Peripheral vascular disease, unspecified: Secondary | ICD-10-CM | POA: Diagnosis not present

## 2017-07-16 DIAGNOSIS — I872 Venous insufficiency (chronic) (peripheral): Secondary | ICD-10-CM | POA: Insufficient documentation

## 2017-07-16 DIAGNOSIS — J449 Chronic obstructive pulmonary disease, unspecified: Secondary | ICD-10-CM | POA: Insufficient documentation

## 2017-07-16 DIAGNOSIS — Z8249 Family history of ischemic heart disease and other diseases of the circulatory system: Secondary | ICD-10-CM | POA: Diagnosis not present

## 2017-07-16 DIAGNOSIS — I712 Thoracic aortic aneurysm, without rupture: Secondary | ICD-10-CM | POA: Insufficient documentation

## 2017-07-16 DIAGNOSIS — F419 Anxiety disorder, unspecified: Secondary | ICD-10-CM | POA: Insufficient documentation

## 2017-07-16 DIAGNOSIS — I110 Hypertensive heart disease with heart failure: Secondary | ICD-10-CM | POA: Insufficient documentation

## 2017-07-16 DIAGNOSIS — I5032 Chronic diastolic (congestive) heart failure: Secondary | ICD-10-CM | POA: Diagnosis not present

## 2017-07-16 DIAGNOSIS — M109 Gout, unspecified: Secondary | ICD-10-CM | POA: Insufficient documentation

## 2017-07-16 DIAGNOSIS — I350 Nonrheumatic aortic (valve) stenosis: Secondary | ICD-10-CM

## 2017-07-16 DIAGNOSIS — F17210 Nicotine dependence, cigarettes, uncomplicated: Secondary | ICD-10-CM | POA: Insufficient documentation

## 2017-07-16 HISTORY — PX: TEE WITHOUT CARDIOVERSION: SHX5443

## 2017-07-16 LAB — POCT I-STAT 3, VENOUS BLOOD GAS (G3P V)
Bicarbonate: 24 mmol/L (ref 20.0–28.0)
O2 SAT: 72 %
PCO2 VEN: 37.8 mmHg — AB (ref 44.0–60.0)
PH VEN: 7.411 (ref 7.250–7.430)
TCO2: 25 mmol/L (ref 22–32)
pO2, Ven: 37 mmHg (ref 32.0–45.0)

## 2017-07-16 LAB — POCT I-STAT 3, ART BLOOD GAS (G3+)
Acid-Base Excess: 1 mmol/L (ref 0.0–2.0)
Bicarbonate: 25.2 mmol/L (ref 20.0–28.0)
O2 Saturation: 96 %
PCO2 ART: 36.9 mmHg (ref 32.0–48.0)
PH ART: 7.442 (ref 7.350–7.450)
TCO2: 26 mmol/L (ref 22–32)
pO2, Arterial: 81 mmHg — ABNORMAL LOW (ref 83.0–108.0)

## 2017-07-16 LAB — PROTIME-INR
INR: 1.02
Prothrombin Time: 13.3 seconds (ref 11.4–15.2)

## 2017-07-16 SURGERY — RIGHT HEART CATH AND CORONARY ANGIOGRAPHY
Anesthesia: LOCAL

## 2017-07-16 SURGERY — ECHOCARDIOGRAM, TRANSESOPHAGEAL
Anesthesia: Monitor Anesthesia Care

## 2017-07-16 MED ORDER — HEPARIN SODIUM (PORCINE) 1000 UNIT/ML IJ SOLN
INTRAMUSCULAR | Status: AC
  Filled 2017-07-16: qty 1

## 2017-07-16 MED ORDER — PROPOFOL 10 MG/ML IV BOLUS
INTRAVENOUS | Status: DC | PRN
  Administered 2017-07-16 (×2): 20 mg via INTRAVENOUS

## 2017-07-16 MED ORDER — SODIUM CHLORIDE 0.9% FLUSH: 3.0000 mL | Freq: Two times a day (BID) | INTRAVENOUS | Status: DC

## 2017-07-16 MED ORDER — LACTATED RINGERS IV SOLN
INTRAVENOUS | Status: DC | PRN
  Administered 2017-07-16: 10:00:00 via INTRAVENOUS

## 2017-07-16 MED ORDER — ASPIRIN 81 MG PO CHEW: 81.0000 mg | CHEWABLE_TABLET | ORAL | Status: DC

## 2017-07-16 MED ORDER — SODIUM CHLORIDE 0.9 % IV SOLN: 250.0000 mL | INTRAVENOUS | Status: DC | PRN

## 2017-07-16 MED ORDER — MIDAZOLAM HCL 2 MG/2ML IJ SOLN
INTRAMUSCULAR | Status: AC
  Filled 2017-07-16: qty 2

## 2017-07-16 MED ORDER — IOPAMIDOL (ISOVUE-370) INJECTION 76%
INTRAVENOUS | Status: AC
  Filled 2017-07-16: qty 50

## 2017-07-16 MED ORDER — LIDOCAINE HCL (PF) 1 % IJ SOLN
INTRAMUSCULAR | Status: AC
  Filled 2017-07-16: qty 30

## 2017-07-16 MED ORDER — VERAPAMIL HCL 2.5 MG/ML IV SOLN
INTRAVENOUS | Status: AC
  Filled 2017-07-16: qty 2

## 2017-07-16 MED ORDER — IOPAMIDOL (ISOVUE-370) INJECTION 76%
INTRAVENOUS | Status: AC
  Filled 2017-07-16: qty 100

## 2017-07-16 MED ORDER — SODIUM CHLORIDE 0.9 % IV SOLN
INTRAVENOUS | Status: AC | PRN
  Administered 2017-07-16: 500 mL via INTRAVENOUS

## 2017-07-16 MED ORDER — MIDAZOLAM HCL 2 MG/2ML IJ SOLN
INTRAMUSCULAR | Status: DC | PRN
  Administered 2017-07-16 (×2): 1 mg via INTRAVENOUS

## 2017-07-16 MED ORDER — VERAPAMIL HCL 2.5 MG/ML IV SOLN
INTRAVENOUS | Status: DC | PRN
  Administered 2017-07-16: 10 mL via INTRA_ARTERIAL

## 2017-07-16 MED ORDER — SODIUM CHLORIDE 0.9 % WEIGHT BASED INFUSION
1.0000 mL/kg/h | INTRAVENOUS | Status: DC
  Administered 2017-07-16: 10:00:00 via INTRAVENOUS

## 2017-07-16 MED ORDER — HEPARIN (PORCINE) IN NACL 2-0.9 UNIT/ML-% IJ SOLN
INTRAMUSCULAR | Status: AC | PRN
  Administered 2017-07-16 (×2): 500 mL

## 2017-07-16 MED ORDER — SODIUM CHLORIDE 0.9 % IV SOLN
INTRAVENOUS | Status: AC
  Administered 2017-07-16: 14:00:00 via INTRAVENOUS

## 2017-07-16 MED ORDER — IOPAMIDOL (ISOVUE-370) INJECTION 76%
INTRAVENOUS | Status: DC | PRN
  Administered 2017-07-16: 175 mL

## 2017-07-16 MED ORDER — SODIUM CHLORIDE 0.9% FLUSH: 3.0000 mL | INTRAVENOUS | Status: DC | PRN

## 2017-07-16 MED ORDER — SODIUM CHLORIDE 0.9 % WEIGHT BASED INFUSION: 3.0000 mL/kg/h | INTRAVENOUS | Status: AC

## 2017-07-16 MED ORDER — HEPARIN SODIUM (PORCINE) 1000 UNIT/ML IJ SOLN
INTRAMUSCULAR | Status: DC | PRN
  Administered 2017-07-16: 6000 [IU] via INTRAVENOUS

## 2017-07-16 MED ORDER — LIDOCAINE VISCOUS 2 % MT SOLN
OROMUCOSAL | Status: AC
  Filled 2017-07-16: qty 15

## 2017-07-16 MED ORDER — FENTANYL CITRATE (PF) 100 MCG/2ML IJ SOLN
INTRAMUSCULAR | Status: AC
  Filled 2017-07-16: qty 2

## 2017-07-16 MED ORDER — PROPOFOL 500 MG/50ML IV EMUL
INTRAVENOUS | Status: DC | PRN
  Administered 2017-07-16: 75 ug/kg/min via INTRAVENOUS

## 2017-07-16 MED ORDER — FENTANYL CITRATE (PF) 100 MCG/2ML IJ SOLN
INTRAMUSCULAR | Status: DC | PRN
  Administered 2017-07-16: 50 ug via INTRAVENOUS
  Administered 2017-07-16 (×2): 25 ug via INTRAVENOUS

## 2017-07-16 MED ORDER — LIDOCAINE HCL (PF) 1 % IJ SOLN
INTRAMUSCULAR | Status: DC | PRN
  Administered 2017-07-16 (×2): 2 mL

## 2017-07-16 MED ORDER — HEPARIN (PORCINE) IN NACL 2-0.9 UNIT/ML-% IJ SOLN
INTRAMUSCULAR | Status: AC
  Filled 2017-07-16: qty 1000

## 2017-07-16 MED ORDER — LIDOCAINE VISCOUS 2 % MT SOLN
OROMUCOSAL | Status: DC | PRN
  Administered 2017-07-16: 10 mL via OROMUCOSAL

## 2017-07-16 SURGICAL SUPPLY — 18 items
BRACE RADIAL COMPRESSION RADST (HEMOSTASIS) ×2 IMPLANT
CATH 5FR JL3.5 JR4 ANG PIG MP (CATHETERS) ×2 IMPLANT
CATH BALLN WEDGE 5F 110CM (CATHETERS) ×2 IMPLANT
CATH INFINITI 5 FR 3DRC (CATHETERS) ×2 IMPLANT
CATH INFINITI 5 FR AL2 (CATHETERS) ×2 IMPLANT
CATH INFINITI 5FR AL1 (CATHETERS) ×2 IMPLANT
CATH LAUNCHER 5F RADR (CATHETERS) ×1 IMPLANT
CATHETER LAUNCHER 5F RADR (CATHETERS) ×2
GLIDESHEATH SLEND SS 6F .021 (SHEATH) ×2 IMPLANT
GUIDEWIRE INQWIRE 1.5J.035X260 (WIRE) ×2 IMPLANT
HOVERMATT SINGLE USE (MISCELLANEOUS) ×2 IMPLANT
INQWIRE 1.5J .035X260CM (WIRE) ×4
KIT HEART LEFT (KITS) ×2 IMPLANT
PACK CARDIAC CATHETERIZATION (CUSTOM PROCEDURE TRAY) ×2 IMPLANT
SHEATH GLIDE SLENDER 4/5FR (SHEATH) ×2 IMPLANT
TRANSDUCER W/STOPCOCK (MISCELLANEOUS) ×2 IMPLANT
TUBING CIL FLEX 10 FLL-RA (TUBING) ×2 IMPLANT
WIRE EMERALD ST .035X150CM (WIRE) ×2 IMPLANT

## 2017-07-16 NOTE — H&P (Signed)
Surgical Consult   Go to Cards  07/05/2017  [facility] Serene, Valentina Gu, MD     Cardiothoracic Surgery         Aortic valve stenosis, etiology of cardiac valve disease unspecified     Dx         Aortic Stenosis  New patient, consultation, ECHO 05/25/2017 ; Referred by Herminio Commons, MD     Reason for Visit           Additional Documentation     Vitals:       BP 148/84 Abnormal   (BP Location: Right Arm, Patient Position: Sitting, Cuff Size: Large) (Abnormal) Cuff Size: forearm         Pulse 95        Resp 18        Ht 5\' 11"  (1.803 m)        Wt 436 lb 12.8 oz (198.1 kg) Abnormal          SpO2 97% RA        BMI 60.92 kg/m        BSA 3.15 m               More Vitals      Flowsheets:        MEWS Score,        Anthropometrics,        Vital Signs       Encounter Info:        Billing Info,        History,        Allergies,        Detailed Report             All Notes         Patient Instructions by Rexene Alberts, MD at 07/05/2017 3:00 PM     Author: Rexene Alberts, MD Author Type: Physician Filed: 07/05/2017  3:47 PM  Note Status: Addendum Cosign: Cosign Not Required Encounter Date: 07/05/2017  Editor: Rexene Alberts, MD (Physician)  Prior Versions: 1. Rexene Alberts, MD (Physician) at 07/05/2017  3:26 PM - Addendum   2. Rexene Alberts, MD (Physician) at 07/05/2017  2:53 PM - Signed              Continue all previous medications without any changes at this time     Stop smoking immediately and permanently.     You have been advised of the numerous benefits associated with regular exercise and weight loss.  The long term benefits for your overall health and wellbeing cannot be overestimated.                   Progress Notes by Rexene Alberts,  MD at 07/05/2017 3:00 PM     Author: Rexene Alberts, MD Author Type: Physician Filed: 07/05/2017  3:47 PM  Note Status: Signed Cosign: Cosign Not Required Encounter Date: 07/05/2017  Editor: Rexene Alberts, MD (Physician)              untitled image  [place] REPORT     Referring Provider is Herminio Commons, MD  PCP is Monico Blitz, MD          Chief Complaint    Patient presents with    .  Aortic Stenosis            New patient, consultation, ECHO 05/25/2017          HPI:     Patient is a 47 year old morbidly obese male with long-standing history of heart murmur, chronic diastolic congestive heart failure, hypertension, chronic venous insufficiency, obstructive sleep apnea on CPAP, and long-standing tobacco abuse with COPD who has been referred for surgical consultation to discuss treatment options for management of suspected bicuspid aortic valve with severe aortic stenosis.  Patient states that he has been told he had a heart murmur essentially all of his life and he was told in the past that he probably had a bicuspid aortic valve.  He describes a long history of symptoms of exertional shortness of breath which have progressed substantially over the past 6 months.  He was referred for cardiology consultation and initially seen in consultation by Dr. Bronson Ing on May 10, 2017.  He underwent transthoracic echocardiogram on May 25, 2017.  Echocardiogram was reported to demonstrate the presence of severe aortic stenosis with normal left ventricular systolic function.  Cardiothoracic surgical consultation was requested.     Patient is single and lives with his nephew in [place].  He works full-time as a Administrator on a local route.  He describes a long history of symptoms of exertional shortness of breath and fatigue which has  progressed substantially over the past 6 months.  He now gets short of breath with minimal activity and occasionally at rest.  He has developed some exertional tightness across his chest.  He has had dizzy spells without syncope.  He has had chronic severe swelling in both lower legs with previous history of transudate of drainage through the skin of both legs related to severe chronic venous insufficiency.  Lower extremity swelling have improved with compression stockings and diuretic therapy.  He has been morbidly obese for all of his adult life.  He states that his weight has gotten up as high as 460-470 pounds.  He is not sure what he weighs presently.  His mobility is limited by severe chronic fatigue and exertional shortness of breath.  He does not have significant arthritis or arthralgias.  He still works full-time driving his truck.  This does not require any lifting including no loading or unloading of the truck.          Past Medical History:    Diagnosis   Date    .   Aortic stenosis        .   Back pain        .   Cellulitis of skin with lymphangitis        .   CHF (congestive heart failure) ([place])        .   Chronic diastolic congestive heart failure ([place])        .   Chronic venous insufficiency        .   Constipation        .   COPD (chronic obstructive pulmonary disease) ([place])        .   Essential hypertension        .   Gout        .   Hypertension        .   Morbid obesity ([place])        .   Obesity        .   Prostatitis        .  Thoracic ascending aortic aneurysm ([place])        .   Tobacco abuse                    Past Surgical History:    Procedure   Laterality   Date    .   NO PAST SURGERIES                        Family History    Problem   Relation   Age of Onset    .   Hypertension   Mother        .   Alzheimer's  disease   Mother        .   Heart attack   Brother        .   Diabetes   Brother               Social History             Socioeconomic History    .   Marital status:   Single            Spouse name:   Not on file    .   Number of children:   Not on file    .   Years of education:   Not on file    .   Highest education level:   Not on file    Social Needs    .   Financial resource strain:   Not on file    .   Food insecurity - worry:   Not on file    .   Food insecurity - inability:   Not on file    .   Transportation needs - medical:   Not on file    .   Transportation needs - non-medical:   Not on file    Occupational History    .   Not on file    Tobacco Use    .   Smoking status:   Current Every Day Smoker            Packs/day:   1.00            Types:   Cigarettes    .   Smokeless tobacco:   Never Used    Substance and Sexual Activity    .   Alcohol use:   Not on file    .   Drug use:   Not on file    .   Sexual activity:   Not on file    Other Topics   Concern    .   Not on file    Social History Narrative    .   Not on file                 Current Outpatient Medications    Medication   Sig   Dispense   Refill    .   albuterol (PROVENTIL HFA;VENTOLIN HFA) 108 (90 Base) MCG/ACT inhaler   Inhale into the lungs every 6 (six) hours as needed for wheezing or shortness of breath.            .   allopurinol (ZYLOPRIM) 300 MG tablet   Take 300 mg by mouth daily.            Marland Kitchen   amLODipine (NORVASC) 10 MG tablet   Take 10 mg by  mouth daily.            .   citalopram (CELEXA) 10 MG tablet   Take 10 mg by mouth at bedtime.       2    .   docusate sodium (COLACE) 100 MG capsule   Take 100 mg by mouth 2 (two) times daily.            .   furosemide  (LASIX) 40 MG tablet   Take 1 tablet (40 mg total) by mouth daily. MAY TAKE AN EXTRA 40 MG AS NEEDED   180 tablet   1    .   ibuprofen (ADVIL,MOTRIN) 800 MG tablet   Take 800 mg by mouth every 8 (eight) hours as needed.            .   IPRATROPIUM BROMIDE IN   Inhale into the lungs.            Marland Kitchen   lisinopril-hydrochlorothiazide (PRINZIDE,ZESTORETIC) 20-25 MG tablet   Take 1 tablet by mouth daily.            .   potassium chloride (K-DUR,KLOR-CON) 10 MEQ tablet   Take 10 mEq by mouth daily.            .   clonazePAM (KLONOPIN) 1 MG tablet   TAKE 1/2 TO 1 TABLET BY MOUTH EVERY 8 HOURS AS NEEDED FOR ANXIETY       2        No current facility-administered medications for this visit.           No Known Allergies           Review of Systems:                 General:                      normal appetite, decreased energy, + weight gain, + weight loss, no fever              Cardiac:                       + chest pain with exertion, no chest pain at rest, + SOB with exertion, + occasional resting SOB, no PND, + orthopnea, no palpitations, no arrhythmia, no atrial fibrillation, + LE edema, + dizzy spells, no syncope              Respiratory:                 + chronic shortness of breath, no home oxygen, no productive cough, no dry cough, + bronchitis, no wheezing, no hemoptysis, + asthma, no pain with inspiration or cough, + sleep apnea, + CPAP at night              GI:                               no difficulty swallowing, no reflux, no frequent heartburn, no hiatal hernia, no abdominal pain, + constipation, no diarrhea, no hematochezia, no hematemesis, no melena              GU:                              no dysuria,  + frequency, no urinary tract infection, no hematuria,  no enlarged prostate, no kidney stones, no kidney disease              Vascular:                     no pain suggestive of claudication, no pain in feet, no  leg cramps, no varicose veins, no DVT, no non-healing foot ulcer              Neuro:                         no stroke, no TIA's, no seizures, no headaches, no temporary blindness one eye,  no slurred speech, no peripheral neuropathy, no chronic pain, no instability of gait, no memory/cognitive dysfunction              Musculoskeletal:         no arthritis, no joint swelling, no myalgias, no difficulty walking, normal mobility               Skin:                            no rash, no itching, no skin infections, no pressure sores or ulcerations              Psych:                         no anxiety, no depression, no nervousness, no unusual recent stress              Eyes:                           no blurry vision, no floaters, no recent vision changes, + wears glasses or contacts              ENT:                            no hearing loss, poor dentition but no loose or painful teeth, no dentures, last saw dentist many years ago              Hematologic:               no easy bruising, no abnormal bleeding, no clotting disorder, no frequent epistaxis              Endocrine:                   no diabetes, does not check CBG's at home                                                                Physical Exam:                 BP (!) 148/84 (BP Location: Right Arm, Patient Position: Sitting, Cuff Size: Large) Comment (Cuff Size): forearm  Pulse 95   Resp 18   Ht 5\' 11"  (1.803 m)   Wt (!) 436 lb 12.8 oz (198.1 kg)   SpO2 97% Comment: RA  BMI 60.92 kg/m  General:                      Morbidly obese male NAD               HEENT:                       Unremarkable               Neck:                           no JVD, no bruits, no adenopathy               Chest:                          clear to auscultation, symmetrical breath sounds, no wheezes, no rhonchi               CV:                              Distant heart sounds, RRR, grade II/VI  crescendo/decrescendo murmur heard best at RSB,  no diastolic murmur              Abdomen:                    soft, non-tender, no masses               Extremities:                 warm, adequately-perfused, pulses not palpable, + bilateral LE edema              Rectal/GU                   Deferred              Neuro:                         Grossly non-focal and symmetrical throughout              Skin:                            Clean and dry, no rashes, no breakdown, + changes of chronic venous insufficiency both lower legs        Diagnostic Tests:     Transthoracic Echocardiography     Patient:    Artur, Winningham  MR #:       [id number]  Study Date: 05/25/2017  Gender:     M  Age:        78  Height:     180.3 cm  Weight:     200.5 kg  BSA:        3.29 m^2  Pt. Status:  Room:      SONOGRAPHER  [facility]   ATTENDING    Kate Sable, MD   [place], MD   [place], MD   PERFORMING   Rayford Halsted     cc:     -------------------------------------------------------------------  LV EF: 60% -   65%     -------------------------------------------------------------------  Indications:      Aortic stenosis 424.1.     -------------------------------------------------------------------  History:   PMH:  30 MIN POST DEFINITY BP:126/82. Possible Bicuspid  Aortic Valve.  Congestive heart failure.  Chronic obstructive  pulmonary disease.  Risk factors:  Current tobacco use.  Hypertension.     -------------------------------------------------------------------  Study Conclusions     - Left ventricle: The cavity size was normal. Wall thickness was    increased in a pattern of moderate LVH. Systolic function was    normal. The estimated ejection fraction was in the range of 60%    to 65%. Wall motion was normal; there were no regional wall    motion abnormalities. Left ventricular  diastolic function    parameters were normal.  - Aortic valve: There was severe stenosis. Mean gradient (S): 48 mm    Hg. Valve area (VTI): 0.99 cm^2. Valve area (Vmax): 0.98 cm^2.    Valve area (Vmean): 0.88 cm^2.  - Aorta: The visualized portion of the proximal ascending aorts is    severely dilated at 5.2 cm. Recommend CTA or MRA to better    evaluate.  - Technically difficult study, echocontrast was used to enhance    visualization.     -------------------------------------------------------------------  Study data:  Comparison was made to the study of 02/19/2017.  Study  status:  Routine.  Procedure:  Transthoracic echocardiography.  Image quality was poor. The study was technically difficult, as a  result of body habitus. Intravenous contrast (Definity) was  administered.  Study completion:  There were no complications.      Transthoracic echocardiography.  M-mode, complete 2D, spectral  Doppler, and color Doppler.  Birthdate:  Patient birthdate:  02/03/1971.  Age:  Patient is 47 yr old.  Sex:  Gender: male.  BMI: 61.7 kg/m^2.  Blood pressure:     111/73  Patient status:  Outpatient.  Study date:  Study date: 05/25/2017. Study time: 09:44  AM.  Location:  Echo laboratory.     -------------------------------------------------------------------     -------------------------------------------------------------------  Left ventricle:  The cavity size was normal. Wall thickness was  increased in a pattern of moderate LVH. Systolic function was  normal. The estimated ejection fraction was in the range of 60% to  65%. Wall motion was normal; there were no regional wall motion  abnormalities. Left ventricular diastolic function parameters were  normal.     -------------------------------------------------------------------  Aortic valve:  Poorly visualized. Uncertain number of leaflets.  Doppler:   There was severe stenosis.   There was no  significant  regurgitation.    VTI ratio of LVOT to aortic valve: 0.31. Valve  area (VTI): 0.99 cm^2. Indexed valve area (VTI): 0.3 cm^2/m^2. Peak  velocity ratio of LVOT to aortic valve: 0.31. Valve area (Vmax):  0.98 cm^2. Indexed valve area (Vmax): 0.3 cm^2/m^2. Mean velocity  ratio of LVOT to aortic valve: 0.28. Valve area (Vmean): 0.88 cm^2.  Indexed valve area (Vmean): 0.27 cm^2/m^2.    Mean gradient (S): 48  mm Hg. Peak gradient (S): 75 mm Hg.     -------------------------------------------------------------------  Aorta:  The visualized portion of the proximal ascending aorts is  severely dilated at 5.2 cm. Recommend CTA or MRA to better  evaluate. Aortic root: The aortic root was normal in size.     -------------------------------------------------------------------  Mitral valve:   Normal thickness leaflets .  Doppler:   There was  no evidence for stenosis.   There was no significant regurgitation.     Peak gradient (D): 4 mm Hg.     -------------------------------------------------------------------  Left atrium:  The atrium was  normal in size.     -------------------------------------------------------------------  Right ventricle:  Poorly visualized. Grossly appears normal in size  and function. Systolic function was normal.     -------------------------------------------------------------------  Pulmonic valve:   Not well visualized.  Doppler:   There was no  evidence for stenosis.   There was no significant regurgitation.     -------------------------------------------------------------------  Tricuspid valve:   Normal thickness leaflets.  Doppler:   There was  no evidence for stenosis.   There was no significant regurgitation.     -------------------------------------------------------------------  Pulmonary artery:    Systolic pressure could not be accurately  estimated.   Inadequate TR jet.      -------------------------------------------------------------------  Right atrium:  The atrium was normal in size.     -------------------------------------------------------------------  Pericardium:  There was no pericardial effusion.     -------------------------------------------------------------------  Systemic veins:  Inferior vena cava: The vessel was dilated. The respirophasic  diameter changes were blunted (< 50%), consistent with elevated  central venous pressure.     -------------------------------------------------------------------  Post procedure conclusions  Ascending Aorta:     - The visualized portion of the proximal ascending aorts is    severely dilated at 5.2 cm. Recommend CTA or MRA to better    evaluate.     -------------------------------------------------------------------  Measurements      Left ventricle                            Value          Reference   LV ID, ED, PLAX chordal           (H)     53    mm       43 - 52   LV ID, ES, PLAX chordal                   31.2  mm       23 - 38   LV fx shortening, PLAX chordal            41    %        >=29   LV PW thickness, ED                       12    mm       ---------   IVS/LV PW ratio, ED                       1.05           <=1.3      Ventricular septum                        Value          Reference   IVS thickness, ED                         12.6  mm       ---------      LVOT                                      Value          Reference   LVOT ID, S  20    mm       ---------   LVOT area                                 3.14  cm^2     ---------   LVOT peak velocity, S                     136   cm/s     ---------   LVOT mean velocity, S                     91.2  cm/s     ---------   LVOT VTI, S                               28.3  cm       ---------   LVOT peak gradient, S                     7     mm Hg    ---------       Aortic valve                              Value          Reference   Aortic valve peak velocity, S             434   cm/s     ---------   Aortic valve mean velocity, S             327   cm/s     ---------   Aortic valve VTI, S                       90.2  cm       ---------   Aortic mean gradient, S                   48    mm Hg    ---------   Aortic peak gradient, S                   75    mm Hg    ---------   VTI ratio, LVOT/AV                        0.31           ---------   Aortic valve area, VTI                    0.99  cm^2     ---------   Aortic valve area/bsa, VTI                0.3   cm^2/m^2 ---------   Velocity ratio, peak, LVOT/AV             0.31           ---------   Aortic valve area, peak velocity          0.98  cm^2     ---------   Aortic valve area/bsa, peak               0.3   cm^2/m^2 ---------   velocity  Velocity ratio, mean, LVOT/AV             0.28           ---------   Aortic valve area, mean velocity          0.88  cm^2     ---------   Aortic valve area/bsa, mean               0.27  cm^2/m^2 ---------   velocity      Aorta                                     Value          Reference   Aortic root ID, ED                        34    mm       ---------      Left atrium                               Value          Reference   LA ID, A-P, ES                            43    mm       ---------   LA volume/bsa, ES, 1-p A4C                24.2  ml/m^2   ---------      Mitral valve                              Value          Reference   Mitral E-wave peak velocity               103   cm/s     ---------   Mitral A-wave peak velocity               106   cm/s     ---------   Mitral deceleration time                  222   ms       150 - 230   Mitral peak gradient, D                   4     mm Hg    ---------   Mitral E/A ratio, peak                    1              ---------      Right ventricle                           Value           Reference   TAPSE                                     31.9  mm       ---------  RV s&', lateral, S                         17.4  cm/s     ---------     Legend:  (L)  and  (H)  mark values outside specified reference range.     -------------------------------------------------------------------  Prepared and Electronically Authenticated by     Kerry Hough, M.D.  2019-01-04T16:15:43        Impression:     Patient has long-standing history of symptoms of exertional shortness of breath, fatigue, and lower extremity edema distant with chronic diastolic congestive heart failure.  Symptoms have progressed substantially over the last few months, and the patient now gets short of breath with minimal activity and occasionally at rest.  I have personally reviewed the transthoracic echocardiogram performed recently at [facility].  There is extremely poor sound transmission.  The aortic valve is not well visualized but findings are suggestive of the presence of possible bicuspid aortic valve with severe aortic stenosis.  Peak velocity across the aortic valve and left ventricular outflow tract was measured greater than 4 m/s corresponding to likely severe aortic stenosis.  Left ventricular systolic function appears preserved.  The ascending aorta appears mildly dilated.  Although findings on this echocardiogram are highly suggestive of severe aortic stenosis, sound transmission is so poor that it would be impossible to make definitive recommendations regarding the patient's management.  Because of the patient's extreme morbid obesity and likely significant problems with chronic respiratory failure related to possible obesity hypoventilation syndrome, obstructive sleep apnea COPD with active tobacco abuse, risks associated with conventional surgery might be very high.         Plan:     As a next step the patient should undergo transesophageal echocardiogram to better assess functional  anatomy and severity of aortic stenosis.  Left and right heart catheterization could be performed at the same time, these tests will certainly be necessary for any definitive decisions could be made.  We will make arrangements for these to be performed in the near future.  We will also obtain cardiac gated CT angiogram of the heart and CT angiogram of the aorta and iliac vessels to accurately size the ascending thoracic aorta and evaluate the feasibility of transcatheter aortic valve replacement as an alternative to conventional surgery if the patient's size and/or respiratory function would preclude safe surgery using conventional techniques.  The patient will undergo formal pulmonary function testing.  He will also be referred for dental service consultation.  The patient will return to our office to review the results of these tests in 3-4 weeks.  The necessity of smoking cessation and weight loss have been emphasized.  All questions answered.        I spent in excess of 90 minutes during the conduct of this office consultation and >50% of this time involved direct face-to-face encounter with the patient for counseling and/or coordination of their care.        Valentina Gu. Roxy Manns, MD  07/05/2017  3:20 PM    For TEE; no changes. Kirk Ruths

## 2017-07-16 NOTE — Anesthesia Preprocedure Evaluation (Addendum)
Anesthesia Evaluation  Patient identified by MRN, date of birth, ID band Patient awake    Reviewed: Allergy & Precautions, NPO status , Patient's Chart, lab work & pertinent test results  Airway Mallampati: II  TM Distance: >3 FB Neck ROM: Full   Comment: Thick neck, full beard Dental  (+) Dental Advisory Given, Chipped   Pulmonary COPD,  COPD inhaler, Current Smoker,    Pulmonary exam normal breath sounds clear to auscultation       Cardiovascular hypertension, Pt. on medications + Peripheral Vascular Disease and +CHF  Normal cardiovascular exam+ Valvular Problems/Murmurs AS  Rhythm:Regular Rate:Normal + Systolic murmurs Thoracic ascending aortic aneurysm  '19 TTE - Moderate LVH. EF was in the range of 60%   to 65%. Diastolic function parameters were normal. - Aortic valve: There was severe stenosis. Mean gradient (S): 48 mmHg. Valve area (VTI): 0.99 cm^2. Valve area (Vmax): 0.98 cm^2. Valve area (Vmean): 0.88 cm^2. The visualized portion of the proximal ascending aorta is  severely dilated at 5.2 cm. Technically difficult study.   Neuro/Psych negative neurological ROS  negative psych ROS   GI/Hepatic negative GI ROS, Neg liver ROS,   Endo/Other  Morbid obesity  Renal/GU negative Renal ROS  negative genitourinary   Musculoskeletal Gout   Abdominal (+) + obese,   Peds  Hematology negative hematology ROS (+)   Anesthesia Other Findings   Reproductive/Obstetrics                            Anesthesia Physical Anesthesia Plan  ASA: IV  Anesthesia Plan: MAC   Post-op Pain Management:    Induction: Intravenous  PONV Risk Score and Plan: Propofol infusion and Treatment may vary due to age or medical condition  Airway Management Planned: Nasal Cannula  Additional Equipment: None  Intra-op Plan:   Post-operative Plan:   Informed Consent: I have reviewed the patients History and  Physical, chart, labs and discussed the procedure including the risks, benefits and alternatives for the proposed anesthesia with the patient or authorized representative who has indicated his/her understanding and acceptance.   Dental advisory given  Plan Discussed with: CRNA  Anesthesia Plan Comments:         Anesthesia Quick Evaluation

## 2017-07-16 NOTE — Progress Notes (Signed)
  Echocardiogram 2D Echocardiogram has been performed.  Tylie Golonka T Sabastian Raimondi 07/16/2017, 10:46 AM

## 2017-07-16 NOTE — CV Procedure (Signed)
    Transesophageal Echocardiogram Note  Reginald [name] [id number] 08/17/1970  Procedure: Transesophageal Echocardiogram Indications: Aortic stenosis  Procedure Details Consent: Obtained Time Out: Verified patient identification, verified procedure, site/side was marked, verified correct patient position, special equipment/implants available, Radiology Safety Procedures followed,  medications/allergies/relevent history reviewed, required imaging and test results available.  Performed  Medications: Pt sedated by anesthesia with lidocaine 50 mg and diprovan 381 mg IV total.   Normal LV function; bicuspid aortic valve; severe AS (mean gradient 46 mmHg); mildly dilated aortic root.   Complications: No apparent complications Patient did tolerate procedure well.  Kirk Ruths, MD

## 2017-07-16 NOTE — Anesthesia Postprocedure Evaluation (Signed)
Anesthesia Post Note  Patient: Reginald [name]  Procedure(s) Performed: TRANSESOPHAGEAL ECHOCARDIOGRAM (TEE) (N/A )     Patient location during evaluation: PACU Anesthesia Type: MAC Level of consciousness: awake and alert Pain management: pain level controlled Vital Signs Assessment: post-procedure vital signs reviewed and stable Respiratory status: spontaneous breathing, nonlabored ventilation and respiratory function stable Cardiovascular status: stable and blood pressure returned to baseline Anesthetic complications: no    Last Vitals:  Vitals:   07/16/17 1050 07/16/17 1055  BP:  118/66  Pulse: 72 73  Resp: (!) 21 16  Temp:    SpO2: 96% 97%    Last Pain:  Vitals:   07/16/17 1023  TempSrc: Oral                 Audry Pili

## 2017-07-16 NOTE — Progress Notes (Signed)
  Echocardiogram 2D Echocardiogram has been performed.  Qianna Clagett T Damiyah Ditmars 07/16/2017, 10:47 AM

## 2017-07-16 NOTE — Progress Notes (Signed)
RT brachial sheath removed/ site level 0.

## 2017-07-16 NOTE — Discharge Instructions (Signed)
Radial Site Care Refer to this sheet in the next few weeks. These instructions provide you with information about caring for yourself after your procedure. Your health care provider may also give you more specific instructions. Your treatment has been planned according to current medical practices, but problems sometimes occur. Call your health care provider if you have any problems or questions after your procedure. What can I expect after the procedure? After your procedure, it is typical to have the following:  Bruising at the radial site that usually fades within 1-2 weeks.  Blood collecting in the tissue (hematoma) that may be painful to the touch. It should usually decrease in size and tenderness within 1-2 weeks.  Follow these instructions at home:  Take medicines only as directed by your health care provider.  You may shower 24-48 hours after the procedure or as directed by your health care provider. Remove the bandage (dressing) and gently wash the site with plain soap and water. Pat the area dry with a clean towel. Do not rub the site, because this may cause bleeding.  Do not take baths, swim, or use a hot tub until your health care provider approves.  Check your insertion site every day for redness, swelling, or drainage.  Do not apply powder or lotion to the site.  Do not flex or bend the affected arm for 24 hours or as directed by your health care provider.  Do not push or pull heavy objects with the affected arm for 24 hours or as directed by your health care provider.  Do not lift over 10 lb (4.5 kg) for 5 days after your procedure or as directed by your health care provider.  Ask your health care provider when it is okay to: ? Return to work or school. ? Resume usual physical activities or sports. ? Resume sexual activity.  Do not drive home if you are discharged the same day as the procedure. Have someone else drive you.  You may drive 24 hours after the procedure  unless otherwise instructed by your health care provider.  Do not operate machinery or power tools for 24 hours after the procedure.  If your procedure was done as an outpatient procedure, which means that you went home the same day as your procedure, a responsible adult should be with you for the first 24 hours after you arrive home.  Keep all follow-up visits as directed by your health care provider. This is important. Contact a health care provider if:  You have a fever.  You have chills.  You have increased bleeding from the radial site. Hold pressure on the site. Get help right away if:  You have unusual pain at the radial site.  You have redness, warmth, or swelling at the radial site.  You have drainage (other than a small amount of blood on the dressing) from the radial site.  The radial site is bleeding, and the bleeding does not stop after 30 minutes of holding steady pressure on the site.  Your arm or hand becomes pale, cool, tingly, or numb. This information is not intended to replace advice given to you by your health care provider. Make sure you discuss any questions you have with your health care provider. Document Released: 06/10/2010 Document Revised: 10/14/2015 Document Reviewed: 11/24/2013 Elsevier Interactive Patient Education  2018 [employer].   TEE  YOU HAD AN CARDIAC PROCEDURE TODAY: Refer to the procedure report and other information in the discharge instructions given to you for  any specific questions about what was found during the examination. If this information does not answer your questions, please call [facility] office at [phone] to clarify.   DIET: Your first meal following the procedure should be a light meal and then it is ok to progress to your normal diet. A half-sandwich or bowl of soup is an example of a good first meal. Heavy or fried foods are harder to digest and may make you feel nauseous or bloated. Drink plenty of fluids but  you should avoid alcoholic beverages for 24 hours. If you had a esophageal dilation, please see attached instructions for diet.   ACTIVITY: Your care partner should take you home directly after the procedure. You should plan to take it easy, moving slowly for the rest of the day. You can resume normal activity the day after the procedure however YOU SHOULD NOT DRIVE, use power tools, machinery or perform tasks that involve climbing or major physical exertion for 24 hours (because of the sedation medicines used during the test).   SYMPTOMS TO REPORT IMMEDIATELY: A cardiologist can be reached at any hour. Please call [phone] for any of the following symptoms:  Vomiting of blood or coffee ground material  New, significant abdominal pain  New, significant chest pain or pain under the shoulder blades  Painful or persistently difficult swallowing  New shortness of breath  Black, tarry-looking or red, bloody stools  FOLLOW UP:  Please also call with any specific questions about appointments or follow up tests.

## 2017-07-16 NOTE — Interval H&P Note (Signed)
History and Physical Interval Note:  07/16/2017 9:53 AM  Reginald [name]  has presented today for surgery, with the diagnosis of SEVERE AORTIC STENOSIS  The various methods of treatment have been discussed with the patient and family. After consideration of risks, benefits and other options for treatment, the patient has consented to  Procedure(s): TRANSESOPHAGEAL ECHOCARDIOGRAM (TEE) (N/A) as a surgical intervention .  [name] history has been reviewed, patient examined, no change in status, stable for surgery.  I have reviewed the patient's chart and labs.  Questions were answered to the patient's satisfaction.     Kirk Ruths

## 2017-07-16 NOTE — Transfer of Care (Signed)
Immediate Anesthesia Transfer of Care Note  Patient: Reginald [name]  Procedure(s) Performed: TRANSESOPHAGEAL ECHOCARDIOGRAM (TEE) (N/A )  Patient Location: PACU and Endoscopy Unit  Anesthesia Type:MAC  Level of Consciousness: awake, alert  and oriented  Airway & Oxygen Therapy: Patient Spontanous Breathing and Patient connected to nasal cannula oxygen  Post-op Assessment: Report given to RN and Post -op Vital signs reviewed and stable  Post vital signs: Reviewed and stable  Last Vitals:  Vitals:   07/16/17 0822  BP: (!) 144/84  Resp: (!) 22  Temp: 37.4 C  SpO2: 97%    Last Pain:  Vitals:   07/16/17 0822  TempSrc: Oral         Complications: No apparent anesthesia complications

## 2017-07-16 NOTE — Interval H&P Note (Signed)
History and Physical Interval Note:  07/16/2017 12:19 PM  [name]  has presented today for cardiac cath with the diagnosis of severe aortic stenosis. The various methods of treatment have been discussed with the patient and family. After consideration of risks, benefits and other options for treatment, the patient has consented to  Procedure(s): RIGHT/LEFT HEART CATH AND CORONARY ANGIOGRAPHY (N/A) as a surgical intervention .  [name] history has been reviewed, patient examined, no change in status, stable for surgery.  I have reviewed the patient's chart and labs.  Questions were answered to the patient's satisfaction.    Cath Lab Visit (complete for each Cath Lab visit)  Clinical Evaluation Leading to the Procedure:   ACS: No.  Non-ACS:    Anginal Classification: CCS II  Anti-ischemic medical therapy: Minimal Therapy (1 class of medications)  Non-Invasive Test Results: No non-invasive testing performed  Prior CABG: No previous CABG         Lauree Chandler

## 2017-07-16 NOTE — Anesthesia Procedure Notes (Signed)
Procedure Name: MAC Date/Time: 07/16/2017 9:45 AM Performed by: Eligha Bridegroom, CRNA Pre-anesthesia Checklist: Patient identified, Suction available, Emergency Drugs available, Patient being monitored and Timeout performed Patient Re-evaluated:Patient Re-evaluated prior to induction Oxygen Delivery Method: Nasal cannula Preoxygenation: Pre-oxygenation with 100% oxygen Induction Type: IV induction

## 2017-07-17 ENCOUNTER — Encounter: Payer: Self-pay | Admitting: *Deleted

## 2017-07-17 MED FILL — Heparin Sodium (Porcine) 2 Unit/ML in Sodium Chloride 0.9%: INTRAMUSCULAR | Qty: 1000 | Status: AC

## 2017-07-18 ENCOUNTER — Encounter (HOSPITAL_COMMUNITY): Payer: Self-pay | Admitting: Dentistry

## 2017-07-18 ENCOUNTER — Ambulatory Visit (HOSPITAL_COMMUNITY): Payer: Self-pay | Admitting: Dentistry

## 2017-07-18 VITALS — BP 122/71 | HR 90 | Temp 98.6°F

## 2017-07-18 DIAGNOSIS — K030 Excessive attrition of teeth: Secondary | ICD-10-CM

## 2017-07-18 DIAGNOSIS — K06010 Localized gingival recession, unspecified: Secondary | ICD-10-CM

## 2017-07-18 DIAGNOSIS — K029 Dental caries, unspecified: Secondary | ICD-10-CM | POA: Insufficient documentation

## 2017-07-18 DIAGNOSIS — I350 Nonrheumatic aortic (valve) stenosis: Secondary | ICD-10-CM

## 2017-07-18 DIAGNOSIS — K0530 Chronic periodontitis, unspecified: Secondary | ICD-10-CM

## 2017-07-18 DIAGNOSIS — K08409 Partial loss of teeth, unspecified cause, unspecified class: Secondary | ICD-10-CM

## 2017-07-18 DIAGNOSIS — K036 Deposits [accretions] on teeth: Secondary | ICD-10-CM

## 2017-07-18 DIAGNOSIS — Z01818 Encounter for other preprocedural examination: Secondary | ICD-10-CM

## 2017-07-18 DIAGNOSIS — M264 Malocclusion, unspecified: Secondary | ICD-10-CM

## 2017-07-18 DIAGNOSIS — K083 Retained dental root: Secondary | ICD-10-CM

## 2017-07-18 MED ORDER — CHLORHEXIDINE GLUCONATE 0.12 % MT SOLN: OROMUCOSAL | 99 refills | Status: DC
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
## 2017-07-18 NOTE — Addendum Note (Signed)
Addended by: Lenn Cal on: 07/18/2017 02:59 PM   Modules accepted: Orders

## 2017-07-18 NOTE — Patient Instructions (Signed)
[place]    Department of Dental Medicine     DR. KULINSKI      HEART VALVES AND MOUTH CARE:  FACTS:   If you have any infection in your mouth, it can infect your heart valve.  If you heart valve is infected, you will be seriously ill.  Infections in the mouth can be SILENT and do not always cause pain.  Examples of infections in the mouth are gum disease, dental cavities, and abscesses.  Some possible signs of infection are: Bad breath, bleeding gums, or teeth that are sensitive to sweets, hot, and/or cold. There are many other signs as well.  WHAT YOU HAVE TO DO:   Brush your teeth after meals and at bedtime. Spend at least 2 minutes brushing well, especially behind your back teeth and all around your teeth that stand alone. Brush at the gumline also.  Do not go to bed without brushing your teeth and flossing.  If you gums bleed when you brush or floss, do NOT stop brushing or flossing. It usually means that your gums need more attention and better cleaning.   If your Dentist or Dr. Kulinski gave you a prescription mouthwash to use, make sure to use it as directed. If you run out of the medication, get a refill at the pharmacy.   If you were given any other medications or directions by your Dentist, please follow them. If you did not understand the directions or forget what you were told, please call. We will be happy to refresh her memory.  If you need antibiotics before dental procedures, make sure you take them one hour prior to every dental visit as directed.   Get a dental checkup every 4-6 months in order to keep your mouth healthy, or to find and treat any new infection. You will most likely need your teeth cleaned or gums treated at the same time.  If you are not able to come in for your scheduled appointment, call your Dentist as soon as possible to reschedule.  If you have a problem in between dental visits, call your Dentist.  

## 2017-07-19 ENCOUNTER — Encounter (HOSPITAL_COMMUNITY): Payer: Self-pay | Admitting: *Deleted

## 2017-07-19 ENCOUNTER — Other Ambulatory Visit: Payer: Self-pay

## 2017-07-19 NOTE — Progress Notes (Signed)
Spoke with pt for pre-op call. Pt has hx of Aortic stenosis (severe) and will be having surgery on it at a later date. Pt denies any chest pain, but does have sob due to the aortic stenosis. Pt denies any recent fever, flu like sx, cough, etc. Instructed pt not to smoke 24 hours prior to surgery. Pt states he will try his best not to do so.

## 2017-07-19 NOTE — Progress Notes (Signed)
Anesthesia Chart Review: SAME DAY WORK-UP.  Patient is a 47 year old male scheduled for multiple teeth extractions with alveoloplasty and gross debridement of remaining teeth on 07/23/17 by Dr. Teena Dunk. He needs future aortic valve surgery.   History includes smoking, HTN, CHF, severe aortic stenosis, ascending TAA, chronic diastolic CHF, chronic venous insufficiency, prostatitis, gout, COPD, morbid obesity (WT 436 lb/198 kg; BMI 60.9 07/05/17).  - PCP is Dr. Monico Blitz. - Cardiologist is Dr. Kate Sable. - CT surgeon is Dr. Darylene Price.  Meds include albuterol, allopurinol, amlodipine, aspirin 81 mg, Precedex, Celexa, Klonopin, Lasix, Atrovent nebulizer, lisinopril-HCTZ, KCl.  EKG 07/16/17: NSR, rightward axis, non-specific T wave abnormality.  Cardiac cath 07/16/17: 1. No angiographic evidence of CAD 2. Severe aortic stenosis by TEE/TTE.  Due to turbulent flow in the ascending aorta and movement of the catheters, I could not cross the valve. The patient had a TEE this am that demonstrated severe AS. I did not feel that further attempts at crossing the valve would provide any clinical data that would change his treatment plan, though more attempts increased the risk of procedure complication. 3. Normal right heart pressures. Recommendations: Will continue planning for AVR vs TAVR. Pt to f/u with Dr. Roxy Manns after CT scans.   TEE 07/16/17: Study Conclusions - Left ventricle: Systolic function was normal. The estimated   ejection fraction was in the range of 55% to 60%. Wall motion was   normal; there were no regional wall motion abnormalities. - Aortic valve: Bicuspid; mildly thickened, mildly calcified   leaflets. There was severe stenosis. - Mitral valve: No evidence of vegetation. - Right atrium: No evidence of thrombus in the atrial cavity or   appendage. - Atrial septum: No defect or patent foramen ovale was identified. - Tricuspid valve: No evidence of vegetation. -  Pulmonic valve: No evidence of vegetation. Impressions: - Normal LV systolic function; bicuspid aortic valve with severe AS   (mean gradient 46 mmHg); no AI; mildly dilated aortic root that   appears to be increasing in size in the ascending aorta but   difficult to fully assess; suggest CTA to further evaluate.  His CTA chest/abd/pelvis, cardiac CT, and PFTs are scheduled for 07/26/17.   Labs on 07/05/17 noted. BMET WNL. WBC 14.0, H/H 15.6/46.5. PLT 369. A1c 5.8.. He also had normal PT/INR on 07/16/17. ISTAT ABGs also done that day.   Patient's BMI is 60. He had an anesthesiologist evaluation at the time of his TEE earlier this week. CBC and BMET will be > 80 days old by 07/23/17 surgery date, but last labs were okay except WBC mildly elevated. A PAT RN did his preoperative phone call today. He denied chest pain, fever, flu symptoms, cough. He has chronic SOB. Discussed with anesthesiologist Dr. Roberts Gaudy and with Dr. Enrique Sack who agree that patient is okay to be a same day work-up. He can get updated labs on the day of surgery.  George Hugh [facility]/Anesthesiology Phone [phone] 07/19/2017 2:10 PM

## 2017-07-22 MED ORDER — DEXTROSE 5 % IV SOLN
3.0000 g | INTRAVENOUS | Status: AC
  Administered 2017-07-23: 3 g via INTRAVENOUS
  Filled 2017-07-22: qty 3000

## 2017-07-23 ENCOUNTER — Encounter (HOSPITAL_COMMUNITY)
Admission: RE | Disposition: A | Discharge status: Home / Self Care | Payer: Self-pay | Source: Ambulatory Visit | Attending: Dentistry

## 2017-07-23 ENCOUNTER — Ambulatory Visit (HOSPITAL_COMMUNITY)
Admission: RE | Admit: 2017-07-23 | Discharge: 2017-07-23 | Disposition: A | Discharge status: Home / Self Care | Payer: BLUE CROSS/BLUE SHIELD | Source: Ambulatory Visit | Attending: Dentistry | Admitting: Dentistry

## 2017-07-23 ENCOUNTER — Ambulatory Visit (HOSPITAL_COMMUNITY): Payer: BLUE CROSS/BLUE SHIELD | Admitting: Vascular Surgery

## 2017-07-23 ENCOUNTER — Encounter (HOSPITAL_COMMUNITY): Payer: Self-pay | Admitting: *Deleted

## 2017-07-23 ENCOUNTER — Other Ambulatory Visit: Payer: Self-pay

## 2017-07-23 DIAGNOSIS — Z79899 Other long term (current) drug therapy: Secondary | ICD-10-CM | POA: Insufficient documentation

## 2017-07-23 DIAGNOSIS — G4733 Obstructive sleep apnea (adult) (pediatric): Secondary | ICD-10-CM | POA: Insufficient documentation

## 2017-07-23 DIAGNOSIS — I5032 Chronic diastolic (congestive) heart failure: Secondary | ICD-10-CM | POA: Insufficient documentation

## 2017-07-23 DIAGNOSIS — F17210 Nicotine dependence, cigarettes, uncomplicated: Secondary | ICD-10-CM | POA: Diagnosis not present

## 2017-07-23 DIAGNOSIS — K083 Retained dental root: Secondary | ICD-10-CM | POA: Insufficient documentation

## 2017-07-23 DIAGNOSIS — I872 Venous insufficiency (chronic) (peripheral): Secondary | ICD-10-CM | POA: Diagnosis not present

## 2017-07-23 DIAGNOSIS — K029 Dental caries, unspecified: Secondary | ICD-10-CM | POA: Insufficient documentation

## 2017-07-23 DIAGNOSIS — K036 Deposits [accretions] on teeth: Secondary | ICD-10-CM | POA: Insufficient documentation

## 2017-07-23 DIAGNOSIS — Z01818 Encounter for other preprocedural examination: Secondary | ICD-10-CM | POA: Diagnosis not present

## 2017-07-23 DIAGNOSIS — I350 Nonrheumatic aortic (valve) stenosis: Secondary | ICD-10-CM | POA: Insufficient documentation

## 2017-07-23 DIAGNOSIS — M109 Gout, unspecified: Secondary | ICD-10-CM | POA: Diagnosis not present

## 2017-07-23 DIAGNOSIS — I110 Hypertensive heart disease with heart failure: Secondary | ICD-10-CM | POA: Insufficient documentation

## 2017-07-23 DIAGNOSIS — J449 Chronic obstructive pulmonary disease, unspecified: Secondary | ICD-10-CM | POA: Insufficient documentation

## 2017-07-23 DIAGNOSIS — K0530 Chronic periodontitis, unspecified: Secondary | ICD-10-CM | POA: Diagnosis not present

## 2017-07-23 DIAGNOSIS — Z6841 Body Mass Index (BMI) 40.0 and over, adult: Secondary | ICD-10-CM | POA: Insufficient documentation

## 2017-07-23 DIAGNOSIS — I712 Thoracic aortic aneurysm, without rupture: Secondary | ICD-10-CM | POA: Diagnosis not present

## 2017-07-23 HISTORY — DX: Pneumonia, unspecified organism: J18.9

## 2017-07-23 HISTORY — DX: Anxiety disorder, unspecified: F41.9

## 2017-07-23 HISTORY — PX: MULTIPLE EXTRACTIONS WITH ALVEOLOPLASTY: SHX5342

## 2017-07-23 HISTORY — DX: Unspecified asthma, uncomplicated: J45.909

## 2017-07-23 HISTORY — DX: Sleep apnea, unspecified: G47.30

## 2017-07-23 HISTORY — DX: Dyspnea, unspecified: R06.00

## 2017-07-23 LAB — CBC
HCT: 43.5 % (ref 39.0–52.0)
Hemoglobin: 14.2 g/dL (ref 13.0–17.0)
MCH: 27 pg (ref 26.0–34.0)
MCHC: 32.6 g/dL (ref 30.0–36.0)
MCV: 82.7 fL (ref 78.0–100.0)
PLATELETS: 263 10*3/uL (ref 150–400)
RBC: 5.26 MIL/uL (ref 4.22–5.81)
RDW: 15.6 % — AB (ref 11.5–15.5)
WBC: 11.3 10*3/uL — ABNORMAL HIGH (ref 4.0–10.5)

## 2017-07-23 LAB — BASIC METABOLIC PANEL
Anion gap: 11 (ref 5–15)
BUN: 16 mg/dL (ref 6–20)
CALCIUM: 8.8 mg/dL — AB (ref 8.9–10.3)
CO2: 23 mmol/L (ref 22–32)
CREATININE: 0.81 mg/dL (ref 0.61–1.24)
Chloride: 105 mmol/L (ref 101–111)
GFR calc Af Amer: >60 mL/min (ref >60)
GFR calc non Af Amer: >60 mL/min (ref >60)
GLUCOSE: 122 mg/dL — AB (ref 65–99)
Potassium: 3.8 mmol/L (ref 3.5–5.1)
SODIUM: 139 mmol/L (ref 135–145)

## 2017-07-23 SURGERY — MULTIPLE EXTRACTION WITH ALVEOLOPLASTY
Anesthesia: General | Site: Mouth

## 2017-07-23 MED ORDER — HYDROCODONE-ACETAMINOPHEN 5-325 MG PO TABS: 1.0000 | ORAL_TABLET | Freq: Four times a day (QID) | ORAL | 0 refills | Status: DC | PRN

## 2017-07-23 MED ORDER — ROCURONIUM BROMIDE 50 MG/5ML IV SOLN
INTRAVENOUS | Status: AC
  Filled 2017-07-23: qty 2

## 2017-07-23 MED ORDER — OXYMETAZOLINE HCL 0.05 % NA SOLN
NASAL | Status: AC
  Filled 2017-07-23: qty 15

## 2017-07-23 MED ORDER — MIDAZOLAM HCL 5 MG/5ML IJ SOLN
INTRAMUSCULAR | Status: DC | PRN
  Administered 2017-07-23 (×2): 1 mg via INTRAVENOUS

## 2017-07-23 MED ORDER — STERILE WATER FOR IRRIGATION IR SOLN
Status: DC | PRN
  Administered 2017-07-23: 1000 mL

## 2017-07-23 MED ORDER — 0.9 % SODIUM CHLORIDE (POUR BTL) OPTIME
TOPICAL | Status: DC | PRN
  Administered 2017-07-23: 1000 mL

## 2017-07-23 MED ORDER — AMINOCAPROIC ACID SOLUTION 5% (50 MG/ML)
10.0000 mL | ORAL | Status: DC
  Filled 2017-07-23: qty 100

## 2017-07-23 MED ORDER — FENTANYL CITRATE (PF) 100 MCG/2ML IJ SOLN
INTRAMUSCULAR | Status: DC | PRN
  Administered 2017-07-23 (×4): 25 ug via INTRAVENOUS

## 2017-07-23 MED ORDER — BUPIVACAINE-EPINEPHRINE (PF) 0.5% -1:200000 IJ SOLN
INTRAMUSCULAR | Status: AC
  Filled 2017-07-23: qty 3.6

## 2017-07-23 MED ORDER — HYDROCODONE-ACETAMINOPHEN 5-325 MG PO TABS: 1.0000 | ORAL_TABLET | ORAL | Status: DC | PRN

## 2017-07-23 MED ORDER — LIDOCAINE-EPINEPHRINE 2 %-1:100000 IJ SOLN
INTRAMUSCULAR | Status: DC | PRN
  Administered 2017-07-23 (×2): 1.7 mL

## 2017-07-23 MED ORDER — ETOMIDATE 2 MG/ML IV SOLN
INTRAVENOUS | Status: AC
  Filled 2017-07-23: qty 20

## 2017-07-23 MED ORDER — DEXAMETHASONE SODIUM PHOSPHATE 10 MG/ML IJ SOLN
INTRAMUSCULAR | Status: AC
  Filled 2017-07-23: qty 1

## 2017-07-23 MED ORDER — HEMOSTATIC AGENTS (NO CHARGE) OPTIME
TOPICAL | Status: DC | PRN
  Administered 2017-07-23: 1 via TOPICAL

## 2017-07-23 MED ORDER — LACTATED RINGERS IV SOLN: INTRAVENOUS | Status: DC

## 2017-07-23 MED ORDER — ONDANSETRON HCL 4 MG/2ML IJ SOLN
INTRAMUSCULAR | Status: AC
  Filled 2017-07-23: qty 2

## 2017-07-23 MED ORDER — FENTANYL CITRATE (PF) 100 MCG/2ML IJ SOLN: 25.0000 ug | INTRAMUSCULAR | Status: DC | PRN

## 2017-07-23 MED ORDER — LIDOCAINE-EPINEPHRINE 2 %-1:100000 IJ SOLN
INTRAMUSCULAR | Status: AC
  Filled 2017-07-23: qty 10.2

## 2017-07-23 MED ORDER — LACTATED RINGERS IV SOLN
INTRAVENOUS | Status: DC
  Administered 2017-07-23: 07:00:00 via INTRAVENOUS

## 2017-07-23 MED ORDER — MIDAZOLAM HCL 2 MG/2ML IJ SOLN
INTRAMUSCULAR | Status: AC
  Filled 2017-07-23: qty 2

## 2017-07-23 MED ORDER — FENTANYL CITRATE (PF) 250 MCG/5ML IJ SOLN
INTRAMUSCULAR | Status: AC
  Filled 2017-07-23: qty 5

## 2017-07-23 SURGICAL SUPPLY — 37 items
ALCOHOL 70% 16 OZ (MISCELLANEOUS) ×3 IMPLANT
ATTRACTOMAT 16X20 MAGNETIC DRP (DRAPES) ×3 IMPLANT
BLADE SURG 15 STRL LF DISP TIS (BLADE) ×1 IMPLANT
BLADE SURG 15 STRL SS (BLADE) ×2
COVER SURGICAL LIGHT HANDLE (MISCELLANEOUS) ×3 IMPLANT
GAUZE PACKING FOLDED 2  STR (GAUZE/BANDAGES/DRESSINGS) ×4
GAUZE PACKING FOLDED 2 STR (GAUZE/BANDAGES/DRESSINGS) ×2 IMPLANT
GAUZE SPONGE 4X4 16PLY XRAY LF (GAUZE/BANDAGES/DRESSINGS) ×3 IMPLANT
GLOVE BIO SURGEON STRL SZ 6.5 (GLOVE) ×2 IMPLANT
GLOVE BIO SURGEONS STRL SZ 6.5 (GLOVE) ×1
GLOVE SURG ORTHO 8.0 STRL STRW (GLOVE) ×3 IMPLANT
GOWN STRL REUS W/ TWL LRG LVL3 (GOWN DISPOSABLE) ×1 IMPLANT
GOWN STRL REUS W/TWL 2XL LVL3 (GOWN DISPOSABLE) ×3 IMPLANT
GOWN STRL REUS W/TWL LRG LVL3 (GOWN DISPOSABLE) ×2
HEMOSTAT SURGICEL 2X14 (HEMOSTASIS) ×3 IMPLANT
KIT BASIN OR (CUSTOM PROCEDURE TRAY) ×3 IMPLANT
KIT ROOM TURNOVER OR (KITS) ×3 IMPLANT
MANIFOLD NEPTUNE WASTE (CANNULA) ×3 IMPLANT
NEEDLE BLUNT 16X1.5 OR ONLY (NEEDLE) ×3 IMPLANT
NEEDLE DENTAL 27 LONG (NEEDLE) ×6 IMPLANT
NS IRRIG 1000ML POUR BTL (IV SOLUTION) ×3 IMPLANT
PACK EENT II TURBAN DRAPE (CUSTOM PROCEDURE TRAY) ×3 IMPLANT
PAD ARMBOARD 7.5X6 YLW CONV (MISCELLANEOUS) ×3 IMPLANT
SPONGE SURGIFOAM ABS GEL 100 (HEMOSTASIS) IMPLANT
SPONGE SURGIFOAM ABS GEL 12-7 (HEMOSTASIS) ×3 IMPLANT
SPONGE SURGIFOAM ABS GEL SZ50 (HEMOSTASIS) IMPLANT
SUCTION FRAZIER HANDLE 10FR (MISCELLANEOUS) ×2
SUCTION TUBE FRAZIER 10FR DISP (MISCELLANEOUS) ×1 IMPLANT
SUT CHROMIC 3 0 PS 2 (SUTURE) ×3 IMPLANT
SUT CHROMIC 4 0 P 3 18 (SUTURE) IMPLANT
SYR 50ML SLIP (SYRINGE) ×3 IMPLANT
TOWEL OR 17X26 10 PK STRL BLUE (TOWEL DISPOSABLE) ×3 IMPLANT
TUBE CONNECTING 12'X1/4 (SUCTIONS) ×1
TUBE CONNECTING 12X1/4 (SUCTIONS) ×2 IMPLANT
WATER STERILE IRR 1000ML POUR (IV SOLUTION) ×3 IMPLANT
WATER TABLETS ICX (MISCELLANEOUS) ×3 IMPLANT
YANKAUER SUCT BULB TIP NO VENT (SUCTIONS) ×3 IMPLANT

## 2017-07-23 NOTE — Progress Notes (Signed)
PRE-OPERATIVE NOTE:  07/23/2017 [name] [id number]  VITALS: BP 136/77   Pulse 77   Temp 98.9 F (37.2 C) (Oral)   Resp 16   Ht 5\' 11"  (1.803 m)   Wt (!) 436 lb (197.8 kg)   SpO2 98%   BMI 60.81 kg/m   Lab Results  Component Value Date   WBC 11.3 (H) 07/23/2017   HGB 14.2 07/23/2017   HCT 43.5 07/23/2017   MCV 82.7 07/23/2017   PLT 263 07/23/2017   BMET    Component Value Date/Time   NA 139 07/23/2017 0640   K 3.8 07/23/2017 0640   CL 105 07/23/2017 0640   CO2 23 07/23/2017 0640   GLUCOSE 122 (H) 07/23/2017 0640   BUN 16 07/23/2017 0640   CREATININE 0.81 07/23/2017 0640   CREATININE 0.95 07/05/2017 1656   CALCIUM 8.8 (L) 07/23/2017 0640   GFRNONAA >60 07/23/2017 0640   GFRAA >60 07/23/2017 0640    Lab Results  Component Value Date   INR 1.02 07/16/2017   No results found for: PTT   [name] presents for extraction of indicated teeth with alveoloplasty and gross debridement of remaining dentition in the operating room with general anesthesia.   SUBJECTIVE: The patient denies any acute medical or dental changes and agrees to proceed with treatment as planned.  EXAM: No sign of acute dental changes.  ASSESSMENT: Patient is affected by retained root segment, dental caries, chronic periodontitis, Accretions, and history of dental and needle phobia.  PLAN: Patient agrees to proceed with treatment as planned in the operating room as previously discussed and accepts the risks, benefits, and complications of the proposed treatment. Patient is aware of the risk for bleeding, bruising, swelling, infection, pain, nerve damage, soft tissue damage, sinus involvement, root tip fracture, mandible fracture, and the risks of complications associated with the anesthesia. Patient also is aware of the potential for other complications to and including death due to his overall cardiovascular and respiratory compromise.    Lenn Cal, DDS

## 2017-07-23 NOTE — Op Note (Signed)
OPERATIVE REPORT  Patient:            Reginald [name] Date of Birth:  July 11, 1970 MRN:                [id number]   DATE OF PROCEDURE:  07/23/2017  PREOPERATIVE DIAGNOSES: 1.  Severe aortic stenosis 2.  Pre-heart valve surgery dental protocol 3.  Dental caries 4.  Retained root segment 5.  Chronic periodontitis 6.  Accretions  POSTOPERATIVE DIAGNOSES: 1.  Severe aortic stenosis 2.  Pre-heart valve surgery dental protocol 3.  Dental caries 4.  Retained root segment 5.  Chronic periodontitis 6.  Accretions  OPERATIONS: 1. Extraction of tooth number 10 with alveoloplasty 2. Gross debridement of remaining dentition   SURGEON: Lenn Cal, DDS  ASSISTANT: Molli Posey (dental assistant)  ANESTHESIA: Monitored anesthesia care  MEDICATIONS: 1. Ancef 2 g IV prior to invasive dental procedures. 2. Local anesthesia with a total utilization of two carpules each containing 34 mg of lidocaine with 0.017 mg of epinephrine   SPECIMENS: There was one tooth that was discarded.  DRAINS: None  CULTURES: None  COMPLICATIONS: None  ESTIMATED BLOOD LOSS: less than 25 mLs.  INTRAVENOUS FLUIDS: 200 mLs of Lactated ringers solution.  INDICATIONS: The patient was recently diagnosed with severe aortic stenosis.  A medically necessary dental consultation was then requested to evaluate poor dentition.  The patient was examined and treatment planned for extraction of tooth #10 with alveoloplasty and gross debridement of  remaining dentition in the operating room with general anesthesia.  This treatment plan was formulated to decrease the risks and complications associated with dental infection from affecting the patient's systemic health and the anticipated heart valve surgery.  OPERATIVE FINDINGS: Patient was examined operating room number 8.  Tooth #10 was identified for extraction.  The patient was noted be affected by chronic periodontitis, accretions, retained root segment #10,  and dental caries.   DESCRIPTION OF PROCEDURE: Patient was brought to the main operating room number 8. Patient was then placed in the supine position on the operating table.  Monitored anesthesia care was then induced per the anesthesia team. The patient was then prepped and draped in the usual manner for dental medicine procedure. A timeout was performed. The patient was identified and procedures were verified. The oral cavity was then thoroughly examined with the findings noted above. The patient was then ready for dental medicine procedure as follows:  Local anesthesia was then administered sequentially with a total utilization of 2 carpules each containing 34 mg of lidocaine with 0.017 mg of epinephrine.  The Maxillary left anterior segment was first approached.  Anesthesia was then delivered utilizing infiltration with lidocaine with epinephrine. A #15 blade incision was then made from the mesial of #11 and extended to the distal of #9.  A  surgical flap was then carefully reflected.  Tooth #10 was subluxated with a series of straight elevators.  Tooth #10 was then removed with a 150 forceps without complications.  Alveoloplasty was then performed utilizing a rongeur and bone file to help achieve primary closure.  The surgical site was then irrigated with copious amounts of sterile saline.  The tissues were approximated and trimmed appropriately.  Piece of Surgifoam was placed the extraction socket.  The surgical site was then closed from the mesial of #11 extended the distal #9 utilizing 3-0 chromic gut suture in a continuous interrupted suture technique x1.  At this point time the remaining dentition was approached.  A sonic  scaler was used to remove accretions.  A series of hand curettes were then used to further remove accretions.  The [place] was then again used to refine the removal of accretions as needed.  This completed the gross debridement procedure.  At this point time, the entire  mouth was irrigated with copious amounts of sterile saline. The patient was examined for complications, seeing none, the dental medicine procedure was deemed to be complete. A series of 4 x 4 gauze were placed in the mouth to aid hemostasis. The patient was then handed over to the anesthesia team for final disposition. After an appropriate amount of time, the patient was taken to the postanesthsia care unit in good condition. All counts were correct for the dental medicine procedure.   Lenn Cal, DDS.

## 2017-07-23 NOTE — Transfer of Care (Signed)
Immediate Anesthesia Transfer of Care Note  Patient: Reginald [name]  Procedure(s) Performed: Extraction of tooth #10 with alveoloplasty and gross debridement of remaining teeth (N/A Mouth)  Patient Location: PACU  Anesthesia Type:MAC  Level of Consciousness: awake, alert , oriented and patient cooperative  Airway & Oxygen Therapy: Patient Spontanous Breathing and Patient connected to nasal cannula oxygen  Post-op Assessment: Report given to RN, Post -op Vital signs reviewed and stable and Patient moving all extremities X 4  Post vital signs: Reviewed and stable  Last Vitals:  Vitals:   07/23/17 0702  BP: 136/77  Pulse: 77  Resp: 16  Temp: 37.2 C  SpO2: 98%    Last Pain:  Vitals:   07/23/17 0702  TempSrc: Oral         Complications: No apparent anesthesia complications

## 2017-07-23 NOTE — Anesthesia Preprocedure Evaluation (Addendum)
Anesthesia Evaluation  Patient identified by MRN, date of birth, ID band Patient awake    Reviewed: Allergy & Precautions, NPO status , Patient's Chart, lab work & pertinent test results  Airway Mallampati: II  TM Distance: >3 FB     Dental   Pulmonary shortness of breath, asthma , sleep apnea , pneumonia, COPD, Current Smoker,    breath sounds clear to auscultation       Cardiovascular hypertension, + Peripheral Vascular Disease and +CHF   Rhythm:Regular Rate:Normal + Systolic murmurs    Neuro/Psych    GI/Hepatic negative GI ROS, Neg liver ROS,   Endo/Other  negative endocrine ROS  Renal/GU negative Renal ROS     Musculoskeletal   Abdominal   Peds  Hematology   Anesthesia Other Findings   Reproductive/Obstetrics                            Anesthesia Physical Anesthesia Plan  ASA: IV  Anesthesia Plan: General   Post-op Pain Management:    Induction: Intravenous  PONV Risk Score and Plan: 1 and Treatment may vary due to age or medical condition  Airway Management Planned: Video Laryngoscope Planned  Additional Equipment:   Intra-op Plan:   Post-operative Plan: Possible Post-op intubation/ventilation  Informed Consent: I have reviewed the patients History and Physical, chart, labs and discussed the procedure including the risks, benefits and alternatives for the proposed anesthesia with the patient or authorized representative who has indicated his/her understanding and acceptance.   Dental advisory given  Plan Discussed with: CRNA, Anesthesiologist and Surgeon  Anesthesia Plan Comments:         Anesthesia Quick Evaluation

## 2017-07-23 NOTE — Anesthesia Procedure Notes (Signed)
Arterial Line Insertion Start/End3/08/2017 7:25 AM, 07/23/2017 7:30 AM Performed by: Kyung Rudd, CRNA, CRNA  Preanesthetic checklist: patient identified, IV checked, site marked, risks and benefits discussed, surgical consent, monitors and equipment checked, pre-op evaluation, timeout performed and anesthesia consent Lidocaine 1% used for infiltration Left, radial was placed Catheter size: 20 G Hand hygiene performed , maximum sterile barriers used  and Seldinger technique used Allen's test indicative of satisfactory collateral circulation Attempts: 1 Procedure performed without using ultrasound guided technique. Following insertion, dressing applied and Biopatch. Post procedure assessment: normal and unchanged  Patient tolerated the procedure well with no immediate complications.

## 2017-07-23 NOTE — Anesthesia Postprocedure Evaluation (Signed)
Anesthesia Post Note  Patient: Reginald [name]  Procedure(s) Performed: Extraction of tooth #10 with alveoloplasty and gross debridement of remaining teeth (N/A Mouth)     Patient location during evaluation: PACU Anesthesia Type: General Level of consciousness: awake Pain management: pain level controlled Vital Signs Assessment: post-procedure vital signs reviewed and stable Respiratory status: spontaneous breathing Cardiovascular status: stable Anesthetic complications: no    Last Vitals:  Vitals:   07/23/17 0927 07/23/17 0940  BP: 111/77 125/77  Pulse: 80 79  Resp: (!) 26 20  Temp:  (!) 36.4 C  SpO2: 93% 96%    Last Pain:  Vitals:   07/23/17 0910  TempSrc:   PainSc: 5                  Boston Cookson

## 2017-07-23 NOTE — H&P (Signed)
07/23/2017  Patient:            Reginald [name] Date of Birth:  [birth year] MRN:                [id number]   BP 136/77   Pulse 77   Temp 98.9 F (37.2 C) (Oral)   Resp 16   Ht 5\' 11"  (1.803 m)   Wt (!) 436 lb (197.8 kg)   SpO2 98%   BMI 60.81 kg/m    Reginald [name] is a 47 year old male with severe aortic stenosis and anticipated aortic valve replacement in the future.  Patient now presents for extraction of indicated teeth with alveoloplasty and gross debridement of remaining dentition in the operating room with general anesthesia. Patient denies any acute medical or dental changes.  Patient agrees to proceed with treatment as planned.  Please see note from Dr. Roxy Manns dated 07/05/2017 to act as the H&P for dental operating room procedure.  Lenn Cal, DDS     [facility]   MULTIDISCIPLIN[facility] VALVE CLINIC     CARDIOTHORACIC SURGERY CONSULTATION REPORT     Referring Provider is Herminio Commons, MD  PCP is Monico Blitz, MD          Chief Complaint    Patient presents with    .   Aortic Stenosis            New patient, consultation, ECHO 05/25/2017          HPI:     Patient is a 47 year old morbidly obese male with long-standing history of heart murmur, chronic diastolic congestive heart failure, hypertension, chronic venous insufficiency, obstructive sleep apnea on CPAP, and long-standing tobacco abuse with COPD who has been referred for surgical consultation to discuss treatment options for management of suspected bicuspid aortic valve with severe aortic stenosis.  Patient states that he has been told he had a heart murmur essentially all of his life and he was told in the past that he probably had a bicuspid aortic valve.  He describes a long history of symptoms of exertional shortness of breath which have progressed substantially over the past 6 months.  He was referred for cardiology consultation and initially seen in  consultation by Dr. Bronson Ing on May 10, 2017.  He underwent transthoracic echocardiogram on May 25, 2017.  Echocardiogram was reported to demonstrate the presence of severe aortic stenosis with normal left ventricular systolic function.  Cardiothoracic surgical consultation was requested.     Patient is single and lives with his nephew in [place].  He works full-time as a Administrator on a local route.  He describes a long history of symptoms of exertional shortness of breath and fatigue which has progressed substantially over the past 6 months.  He now gets short of breath with minimal activity and occasionally at rest.  He has developed some exertional tightness across his chest.  He has had dizzy spells without syncope.  He has had chronic severe swelling in both lower legs with previous history of transudate of drainage through the skin of both legs related to severe chronic venous insufficiency.  Lower extremity swelling have improved with compression stockings and diuretic therapy.  He has been morbidly obese for all of his adult life.  He states that his weight has gotten up as high as 460-470 pounds.  He is not sure what he weighs presently.  His mobility is limited by severe chronic fatigue and exertional shortness  of breath.  He does not have significant arthritis or arthralgias.  He still works full-time driving his truck.  This does not require any lifting including no loading or unloading of the truck.          Past Medical History:    Diagnosis   Date    .   Aortic stenosis        .   Back pain        .   Cellulitis of skin with lymphangitis        .   CHF (congestive heart failure) ([place])        .   Chronic diastolic congestive heart failure ([place])        .   Chronic venous insufficiency        .   Constipation        .   COPD (chronic obstructive pulmonary disease) ([place])        .   Essential hypertension         .   Gout        .   Hypertension        .   Morbid obesity ([place])        .   Obesity        .   Prostatitis        .   Thoracic ascending aortic aneurysm ([place])        .   Tobacco abuse                    Past Surgical History:    Procedure   Laterality   Date    .   NO PAST SURGERIES                        Family History    Problem   Relation   Age of Onset    .   Hypertension   Mother        .   Alzheimer's disease   Mother        .   Heart attack   Brother        .   Diabetes   Brother               Social History             Socioeconomic History    .   Marital status:   Single            Spouse name:   Not on file    .   Number of children:   Not on file    .   Years of education:   Not on file    .   Highest education level:   Not on file    Social Needs    .   Financial resource strain:   Not on file    .   Food insecurity - worry:   Not on file    .   Food insecurity - inability:   Not on file    .   Transportation needs - medical:   Not on file    .   Transportation needs - non-medical:   Not on file    Occupational History    .   Not on file    Tobacco Use    .   Smoking status:   Current Every Day  Smoker            Packs/day:   1.00            Types:   Cigarettes    .   Smokeless tobacco:   Never Used    Substance and Sexual Activity    .   Alcohol use:   Not on file    .   Drug use:   Not on file    .   Sexual activity:   Not on file    Other Topics   Concern    .   Not on file    Social History Narrative    .   Not on file                 Current Outpatient Medications    Medication   Sig   Dispense   Refill    .   albuterol (PROVENTIL HFA;VENTOLIN HFA) 108 (90 Base) MCG/ACT  inhaler   Inhale into the lungs every 6 (six) hours as needed for wheezing or shortness of breath.            .   allopurinol (ZYLOPRIM) 300 MG tablet   Take 300 mg by mouth daily.            Marland Kitchen   amLODipine (NORVASC) 10 MG tablet   Take 10 mg by mouth daily.            .   citalopram (CELEXA) 10 MG tablet   Take 10 mg by mouth at bedtime.       2    .   docusate sodium (COLACE) 100 MG capsule   Take 100 mg by mouth 2 (two) times daily.            .   furosemide (LASIX) 40 MG tablet   Take 1 tablet (40 mg total) by mouth daily. MAY TAKE AN EXTRA 40 MG AS NEEDED   180 tablet   1    .   ibuprofen (ADVIL,MOTRIN) 800 MG tablet   Take 800 mg by mouth every 8 (eight) hours as needed.            .   IPRATROPIUM BROMIDE IN   Inhale into the lungs.            Marland Kitchen   lisinopril-hydrochlorothiazide (PRINZIDE,ZESTORETIC) 20-25 MG tablet   Take 1 tablet by mouth daily.            .   potassium chloride (K-DUR,KLOR-CON) 10 MEQ tablet   Take 10 mEq by mouth daily.            .   clonazePAM (KLONOPIN) 1 MG tablet   TAKE 1/2 TO 1 TABLET BY MOUTH EVERY 8 HOURS AS NEEDED FOR ANXIETY       2        No current facility-administered medications for this visit.           No Known Allergies           Review of Systems:                 General:                      normal appetite, decreased energy, + weight gain, + weight loss, no fever              Cardiac:                       +  chest pain with exertion, no chest pain at rest, + SOB with exertion, + occasional resting SOB, no PND, + orthopnea, no palpitations, no arrhythmia, no atrial fibrillation, + LE edema, + dizzy spells, no syncope              Respiratory:                 + chronic shortness of breath, no home oxygen, no productive cough, no dry cough, + bronchitis, no wheezing, no hemoptysis, + asthma, no pain with inspiration or cough, +  sleep apnea, + CPAP at night              GI:                               no difficulty swallowing, no reflux, no frequent heartburn, no hiatal hernia, no abdominal pain, + constipation, no diarrhea, no hematochezia, no hematemesis, no melena              GU:                              no dysuria,  + frequency, no urinary tract infection, no hematuria, no enlarged prostate, no kidney stones, no kidney disease              Vascular:                     no pain suggestive of claudication, no pain in feet, no leg cramps, no varicose veins, no DVT, no non-healing foot ulcer              Neuro:                         no stroke, no TIA's, no seizures, no headaches, no temporary blindness one eye,  no slurred speech, no peripheral neuropathy, no chronic pain, no instability of gait, no memory/cognitive dysfunction              Musculoskeletal:         no arthritis, no joint swelling, no myalgias, no difficulty walking, normal mobility               Skin:                            no rash, no itching, no skin infections, no pressure sores or ulcerations              Psych:                         no anxiety, no depression, no nervousness, no unusual recent stress              Eyes:                           no blurry vision, no floaters, no recent vision changes, + wears glasses or contacts              ENT:                            no hearing loss, poor dentition but no loose or painful teeth, no dentures,  last saw dentist many years ago              Hematologic:               no easy bruising, no abnormal bleeding, no clotting disorder, no frequent epistaxis              Endocrine:                   no diabetes, does not check CBG's at home                                                                Physical Exam:                 BP (!) 148/84 (BP Location: Right Arm, Patient Position: Sitting, Cuff Size: Large) Comment (Cuff Size): forearm  Pulse 95   Resp 18   Ht 5'  11" (1.803 m)   Wt (!) 436 lb 12.8 oz (198.1 kg)   SpO2 97% Comment: RA  BMI 60.92 kg/m               General:                      Morbidly obese male NAD               HEENT:                       Unremarkable               Neck:                           no JVD, no bruits, no adenopathy               Chest:                          clear to auscultation, symmetrical breath sounds, no wheezes, no rhonchi               CV:                              Distant heart sounds, RRR, grade II/VI crescendo/decrescendo murmur heard best at RSB,  no diastolic murmur              Abdomen:                    soft, non-tender, no masses               Extremities:                 warm, adequately-perfused, pulses not palpable, + bilateral LE edema              Rectal/GU                   Deferred              Neuro:  Grossly non-focal and symmetrical throughout              Skin:                            Clean and dry, no rashes, no breakdown, + changes of chronic venous insufficiency both lower legs        Diagnostic Tests:     Transthoracic Echocardiography     Patient:    Reginald [name], Reginald [name]  MR #:       [id number]  Study Date: 05/25/2017  Gender:     M  Age:        66  Height:     180.3 cm  Weight:     200.5 kg  BSA:        3.29 m^2  Pt. Status:  Room:      SONOGRAPHER  [facility]   ATTENDING    Kate Sable, MD   [place], MD   [place], MD   PERFORMING   Rayford Halsted     cc:     -------------------------------------------------------------------  LV EF: 60% -   65%     -------------------------------------------------------------------  Indications:      Aortic stenosis 424.1.     -------------------------------------------------------------------  History:   PMH:  30 MIN POST DEFINITY BP:126/82. Possible Bicuspid  Aortic Valve.  Congestive heart failure.   Chronic obstructive  pulmonary disease.  Risk factors:  Current tobacco use.  Hypertension.     -------------------------------------------------------------------  Study Conclusions     - Left ventricle: The cavity size was normal. Wall thickness was    increased in a pattern of moderate LVH. Systolic function was    normal. The estimated ejection fraction was in the range of 60%    to 65%. Wall motion was normal; there were no regional wall    motion abnormalities. Left ventricular diastolic function    parameters were normal.  - Aortic valve: There was severe stenosis. Mean gradient (S): 48 mm    Hg. Valve area (VTI): 0.99 cm^2. Valve area (Vmax): 0.98 cm^2.    Valve area (Vmean): 0.88 cm^2.  - Aorta: The visualized portion of the proximal ascending aorts is    severely dilated at 5.2 cm. Recommend CTA or MRA to better    evaluate.  - Technically difficult study, echocontrast was used to enhance    visualization.     -------------------------------------------------------------------  Study data:  Comparison was made to the study of 02/19/2017.  Study  status:  Routine.  Procedure:  Transthoracic echocardiography.  Image quality was poor. The study was technically difficult, as a  result of body habitus. Intravenous contrast (Definity) was  administered.  Study completion:  There were no complications.      Transthoracic echocardiography.  M-mode, complete 2D, spectral  Doppler, and color Doppler.  Birthdate:  Patient birthdate:  Dec 18, 1970.  Age:  Patient is 47 yr old.  Sex:  Gender: male.  BMI: 61.7 kg/m^2.  Blood pressure:     111/73  Patient status:  Outpatient.  Study date:  Study date: 05/25/2017. Study time: 09:44  AM.  Location:  Echo laboratory.     -------------------------------------------------------------------     -------------------------------------------------------------------  Left ventricle:  The cavity size was  normal. Wall thickness was  increased in a pattern of moderate LVH. Systolic function was  normal. The estimated ejection fraction was in  the range of 60% to  65%. Wall motion was normal; there were no regional wall motion  abnormalities. Left ventricular diastolic function parameters were  normal.     -------------------------------------------------------------------  Aortic valve:  Poorly visualized. Uncertain number of leaflets.  Doppler:   There was severe stenosis.   There was no significant  regurgitation.    VTI ratio of LVOT to aortic valve: 0.31. Valve  area (VTI): 0.99 cm^2. Indexed valve area (VTI): 0.3 cm^2/m^2. Peak  velocity ratio of LVOT to aortic valve: 0.31. Valve area (Vmax):  0.98 cm^2. Indexed valve area (Vmax): 0.3 cm^2/m^2. Mean velocity  ratio of LVOT to aortic valve: 0.28. Valve area (Vmean): 0.88 cm^2.  Indexed valve area (Vmean): 0.27 cm^2/m^2.    Mean gradient (S): 48  mm Hg. Peak gradient (S): 75 mm Hg.     -------------------------------------------------------------------  Aorta:  The visualized portion of the proximal ascending aorts is  severely dilated at 5.2 cm. Recommend CTA or MRA to better  evaluate. Aortic root: The aortic root was normal in size.     -------------------------------------------------------------------  Mitral valve:   Normal thickness leaflets .  Doppler:   There was  no evidence for stenosis.   There was no significant regurgitation.     Peak gradient (D): 4 mm Hg.     -------------------------------------------------------------------  Left atrium:  The atrium was normal in size.     -------------------------------------------------------------------  Right ventricle:  Poorly visualized. Grossly appears normal in size  and function. Systolic function was normal.     -------------------------------------------------------------------  Pulmonic valve:   Not well visualized.  Doppler:    There was no  evidence for stenosis.   There was no significant regurgitation.     -------------------------------------------------------------------  Tricuspid valve:   Normal thickness leaflets.  Doppler:   There was  no evidence for stenosis.   There was no significant regurgitation.     -------------------------------------------------------------------  Pulmonary artery:    Systolic pressure could not be accurately  estimated.   Inadequate TR jet.     -------------------------------------------------------------------  Right atrium:  The atrium was normal in size.     -------------------------------------------------------------------  Pericardium:  There was no pericardial effusion.     -------------------------------------------------------------------  Systemic veins:  Inferior vena cava: The vessel was dilated. The respirophasic  diameter changes were blunted (< 50%), consistent with elevated  central venous pressure.     -------------------------------------------------------------------  Post procedure conclusions  Ascending Aorta:     - The visualized portion of the proximal ascending aorts is    severely dilated at 5.2 cm. Recommend CTA or MRA to better    evaluate.     -------------------------------------------------------------------  Measurements      Left ventricle                            Value          Reference   LV ID, ED, PLAX chordal           (H)     53    mm       43 - 52   LV ID, ES, PLAX chordal                   31.2  mm       23 - 38   LV fx shortening, PLAX chordal            41    %        >=  29   LV PW thickness, ED                       12    mm       ---------   IVS/LV PW ratio, ED                       1.05           <=1.3      Ventricular septum                        Value          Reference   IVS thickness, ED                         12.6  mm       ---------      LVOT                                       Value          Reference   LVOT ID, S                                20    mm       ---------   LVOT area                                 3.14  cm^2     ---------   LVOT peak velocity, S                     136   cm/s     ---------   LVOT mean velocity, S                     91.2  cm/s     ---------   LVOT VTI, S                               28.3  cm       ---------   LVOT peak gradient, S                     7     mm Hg    ---------      Aortic valve                              Value          Reference   Aortic valve peak velocity, S             434   cm/s     ---------   Aortic valve mean velocity, S             327   cm/s     ---------   Aortic valve VTI, S                       90.2  cm       ---------  Aortic mean gradient, S                   48    mm Hg    ---------   Aortic peak gradient, S                   75    mm Hg    ---------   VTI ratio, LVOT/AV                        0.31           ---------   Aortic valve area, VTI                    0.99  cm^2     ---------   Aortic valve area/bsa, VTI                0.3   cm^2/m^2 ---------   Velocity ratio, peak, LVOT/AV             0.31           ---------   Aortic valve area, peak velocity          0.98  cm^2     ---------   Aortic valve area/bsa, peak               0.3   cm^2/m^2 ---------   velocity   Velocity ratio, mean, LVOT/AV             0.28           ---------   Aortic valve area, mean velocity          0.88  cm^2     ---------   Aortic valve area/bsa, mean               0.27  cm^2/m^2 ---------   velocity      Aorta                                     Value          Reference   Aortic root ID, ED                        34    mm       ---------      Left atrium                               Value          Reference   LA ID, A-P, ES                            43    mm       ---------   LA volume/bsa, ES, 1-p A4C                24.2  ml/m^2   ---------      Mitral valve                               Value          Reference   Mitral E-wave peak velocity  103   cm/s     ---------   Mitral A-wave peak velocity               106   cm/s     ---------   Mitral deceleration time                  222   ms       150 - 230   Mitral peak gradient, D                   4     mm Hg    ---------   Mitral E/A ratio, peak                    1              ---------      Right ventricle                           Value          Reference   TAPSE                                     31.9  mm       ---------   RV s&', lateral, S                         17.4  cm/s     ---------     Legend:  (L)  and  (H)  mark values outside specified reference range.     -------------------------------------------------------------------  Prepared and Electronically Authenticated by     Kerry Hough, M.D.  2019-01-04T16:15:43        Impression:     Patient has long-standing history of symptoms of exertional shortness of breath, fatigue, and lower extremity edema distant with chronic diastolic congestive heart failure.  Symptoms have progressed substantially over the last few months, and the patient now gets short of breath with minimal activity and occasionally at rest.  I have personally reviewed the transthoracic echocardiogram performed recently at [facility].  There is extremely poor sound transmission.  The aortic valve is not well visualized but findings are suggestive of the presence of possible bicuspid aortic valve with severe aortic stenosis.  Peak velocity across the aortic valve and left ventricular outflow tract was measured greater than 4 m/s corresponding to likely severe aortic stenosis.  Left ventricular systolic function appears preserved.  The ascending aorta appears mildly dilated.  Although findings on this echocardiogram are highly suggestive of severe aortic stenosis, sound transmission is so poor that it would be impossible to make definitive  recommendations regarding the patient's management.  Because of the patient's extreme morbid obesity and likely significant problems with chronic respiratory failure related to possible obesity hypoventilation syndrome, obstructive sleep apnea COPD with active tobacco abuse, risks associated with conventional surgery might be very high.         Plan:     As a next step the patient should undergo transesophageal echocardiogram to better assess functional anatomy and severity of aortic stenosis.  Left and right heart catheterization could be performed at the same time, these tests will certainly be necessary for any definitive decisions could be made.  We will make arrangements for these to be performed in the  near future.  We will also obtain cardiac gated CT angiogram of the heart and CT angiogram of the aorta and iliac vessels to accurately size the ascending thoracic aorta and evaluate the feasibility of transcatheter aortic valve replacement as an alternative to conventional surgery if the patient's size and/or respiratory function would preclude safe surgery using conventional techniques.  The patient will undergo formal pulmonary function testing.  He will also be referred for dental service consultation.  The patient will return to our office to review the results of these tests in 3-4 weeks.  The necessity of smoking cessation and weight loss have been emphasized.  All questions answered.        I spent in excess of 90 minutes during the conduct of this office consultation and >50% of this time involved direct face-to-face encounter with the patient for counseling and/or coordination of their care.        Valentina Gu. Roxy Manns, MD  07/05/2017  3:20 PM                 Electronically signed by Rexene Alberts, MD at 07/05/2017  3:47 PM

## 2017-07-23 NOTE — Anesthesia Procedure Notes (Signed)
Procedure Name: MAC Date/Time: 07/23/2017 8:31 AM Performed by: Colin Benton, CRNA Pre-anesthesia Checklist: Patient identified, Emergency Drugs available, Suction available and Patient being monitored Patient Re-evaluated:Patient Re-evaluated prior to induction Oxygen Delivery Method: Nasal cannula Induction Type: IV induction Placement Confirmation: positive ETCO2 Dental Injury: Teeth and Oropharynx as per pre-operative assessment

## 2017-07-23 NOTE — Discharge Instructions (Signed)

## 2017-07-24 ENCOUNTER — Encounter (HOSPITAL_COMMUNITY): Payer: Self-pay | Admitting: Dentistry

## 2017-07-26 ENCOUNTER — Ambulatory Visit (HOSPITAL_COMMUNITY): Payer: BLUE CROSS/BLUE SHIELD

## 2017-07-26 ENCOUNTER — Ambulatory Visit (HOSPITAL_COMMUNITY)
Admission: RE | Admit: 2017-07-26 | Discharge: 2017-07-26 | Disposition: A | Discharge status: Home / Self Care | Payer: BLUE CROSS/BLUE SHIELD | Source: Ambulatory Visit | Attending: Thoracic Surgery (Cardiothoracic Vascular Surgery) | Admitting: Thoracic Surgery (Cardiothoracic Vascular Surgery)

## 2017-07-26 DIAGNOSIS — I712 Thoracic aortic aneurysm, without rupture: Secondary | ICD-10-CM | POA: Insufficient documentation

## 2017-07-26 DIAGNOSIS — I350 Nonrheumatic aortic (valve) stenosis: Secondary | ICD-10-CM | POA: Diagnosis not present

## 2017-07-26 DIAGNOSIS — I359 Nonrheumatic aortic valve disorder, unspecified: Secondary | ICD-10-CM | POA: Diagnosis present

## 2017-07-26 DIAGNOSIS — I700 Atherosclerosis of aorta: Secondary | ICD-10-CM | POA: Insufficient documentation

## 2017-07-26 LAB — PULMONARY FUNCTION TEST
DL/VA % pred: 94 %
DL/VA: 4.45 ml/min/mmHg/L
DLCO UNC % PRED: 74 %
DLCO UNC: 25.03 ml/min/mmHg
DLCO cor % pred: 75 %
DLCO cor: 25.32 ml/min/mmHg
FEF 25-75 PRE: 2.86 L/s
FEF 25-75 Post: 2.92 L/sec
FEF2575-%Change-Post: 1 %
FEF2575-%Pred-Post: 77 %
FEF2575-%Pred-Pre: 76 %
FEV1-%Change-Post: 0 %
FEV1-%PRED-POST: 75 %
FEV1-%PRED-PRE: 75 %
FEV1-PRE: 3.17 L
FEV1-Post: 3.17 L
FEV1FVC-%Change-Post: 6 %
FEV1FVC-%Pred-Pre: 98 %
FEV6-%CHANGE-POST: -5 %
FEV6-%PRED-POST: 74 %
FEV6-%PRED-PRE: 79 %
FEV6-PRE: 4.09 L
FEV6-Post: 3.85 L
FEV6FVC-%CHANGE-POST: 0 %
FEV6FVC-%PRED-PRE: 102 %
FEV6FVC-%Pred-Post: 103 %
FVC-%Change-Post: -5 %
FVC-%Pred-Post: 72 %
FVC-%Pred-Pre: 76 %
FVC-Post: 3.85 L
FVC-Pre: 4.09 L
POST FEV1/FVC RATIO: 82 %
POST FEV6/FVC RATIO: 100 %
Pre FEV1/FVC ratio: 77 %
Pre FEV6/FVC Ratio: 100 %
RV % PRED: 110 %
RV: 2.23 L
TLC % pred: 88 %
TLC: 6.34 L

## 2017-07-26 MED ORDER — METOPROLOL TARTRATE 5 MG/5ML IV SOLN
INTRAVENOUS | Status: AC
  Administered 2017-07-26: 5 mg via INTRAVENOUS
  Filled 2017-07-26: qty 10

## 2017-07-26 MED ORDER — METOPROLOL TARTRATE 5 MG/5ML IV SOLN
5.0000 mg | INTRAVENOUS | Status: DC | PRN
  Administered 2017-07-26: 5 mg via INTRAVENOUS
  Filled 2017-07-26 (×2): qty 5

## 2017-07-26 MED ORDER — ALBUTEROL SULFATE (2.5 MG/3ML) 0.083% IN NEBU
2.5000 mg | INHALATION_SOLUTION | Freq: Once | RESPIRATORY_TRACT | Status: AC
  Administered 2017-07-26: 2.5 mg via RESPIRATORY_TRACT

## 2017-07-26 MED ORDER — IOPAMIDOL (ISOVUE-370) INJECTION 76%
INTRAVENOUS | Status: AC
  Filled 2017-07-26: qty 100

## 2017-07-30 ENCOUNTER — Ambulatory Visit (INDEPENDENT_AMBULATORY_CARE_PROVIDER_SITE_OTHER): Payer: BLUE CROSS/BLUE SHIELD | Admitting: Thoracic Surgery (Cardiothoracic Vascular Surgery)

## 2017-07-30 ENCOUNTER — Encounter (HOSPITAL_COMMUNITY): Payer: Self-pay | Admitting: Dentistry

## 2017-07-30 ENCOUNTER — Encounter: Payer: Self-pay | Admitting: Thoracic Surgery (Cardiothoracic Vascular Surgery)

## 2017-07-30 ENCOUNTER — Ambulatory Visit (HOSPITAL_COMMUNITY): Payer: Medicaid - Dental | Admitting: Dentistry

## 2017-07-30 ENCOUNTER — Other Ambulatory Visit: Payer: Self-pay | Admitting: *Deleted

## 2017-07-30 VITALS — BP 122/51 | HR 81 | Temp 97.7°F

## 2017-07-30 VITALS — BP 128/80 | HR 82 | Resp 20 | Ht 71.0 in | Wt >= 6400 oz

## 2017-07-30 DIAGNOSIS — I350 Nonrheumatic aortic (valve) stenosis: Secondary | ICD-10-CM

## 2017-07-30 DIAGNOSIS — I712 Thoracic aortic aneurysm, without rupture: Secondary | ICD-10-CM

## 2017-07-30 DIAGNOSIS — I7121 Aneurysm of the ascending aorta, without rupture: Secondary | ICD-10-CM

## 2017-07-30 DIAGNOSIS — Z01818 Encounter for other preprocedural examination: Secondary | ICD-10-CM

## 2017-07-30 DIAGNOSIS — K08199 Complete loss of teeth due to other specified cause, unspecified class: Secondary | ICD-10-CM

## 2017-07-30 NOTE — Progress Notes (Signed)
[address]       [place], [address]             [phone]     CARDIOTHORACIC SURGERY OFFICE NOTE  Referring Provider is Herminio Commons, MD PCP is Monico Blitz, MD   HPI:  Patient is a 47 year old morbidly obese male with multiple medical problems who returns to the office today for follow-up of likely bicuspid aortic valve with severe symptomatic aortic stenosis.  He was originally seen in consultation on July 05, 2017.  Since then he underwent TEE and diagnostic cardiac catheterization on July 16, 2017.  TEE confirmed the presence of bicuspid aortic valve with severe aortic stenosis.  Mean transvalvular gradient across the aortic valve was estimated 46 mmHg.  Left ventricular systolic function appeared normal with ejection fraction estimated 55-60%.  The ascending aorta appeared dilated but was difficult to image.  No other significant abnormalities were noted.  Catheterization was notable for the absence of significant coronary artery disease.  The aortic valve could not be crossed.  There were normal right-sided pressures.  The patient subsequently underwent CT angiography and pulmonary function tests, and he returns to our office today to discuss treatment options further.  He reports no new problems or complaints.  He gets short of breath quite easily with low level activity.  He is not having resting shortness of breath.  He states that recently his weight has been trending up and he had increased swelling.  He took some extra Lasix and symptoms improved.  He has had some mild vague tightness across his chest that does not seem to be related to activity, and he admits may be related to anxiety.   Current Outpatient Medications  Medication Sig Dispense Refill  . albuterol (PROVENTIL HFA;VENTOLIN HFA) 108 (90 Base) MCG/ACT inhaler Inhale 2 puffs into the lungs every 6 (six) hours as needed for wheezing or shortness of breath.     . allopurinol  (ZYLOPRIM) 300 MG tablet Take 300 mg by mouth daily.    Marland Kitchen amLODipine (NORVASC) 10 MG tablet Take 10 mg by mouth daily.    Marland Kitchen aspirin EC 81 MG tablet Take 81 mg by mouth once.    . chlorhexidine (PERIDEX) 0.12 % solution Rinse with 15 mls twice daily for 30 seconds. Use after breakfast and at bedtime. Spit out excess. Do not swallow. 480 mL prn  . citalopram (CELEXA) 10 MG tablet Take 20 mg by mouth at bedtime.   2  . clonazePAM (KLONOPIN) 1 MG tablet TAKE 1 MG BY MOUTH EVERY 8 HOURS AS NEEDED FOR ANXIETY  2  . docusate sodium (COLACE) 100 MG capsule Take 200 mg by mouth daily.     . furosemide (LASIX) 40 MG tablet Take 1 tablet (40 mg total) by mouth daily. MAY TAKE AN EXTRA 40 MG AS NEEDED (Patient taking differently: Take 80 mg by mouth daily. MAY TAKE AN EXTRA 40 MG AS NEEDED) 180 tablet 1  . HYDROcodone-acetaminophen (NORCO) 5-325 MG tablet Take 1-2 tablets by mouth every 6 (six) hours as needed for moderate pain or severe pain. 15 tablet 0  . ibuprofen (ADVIL,MOTRIN) 800 MG tablet Take 800 mg by mouth every 8 (eight) hours as needed for headache or moderate pain.     Marland Kitchen ipratropium (ATROVENT) 0.02 % nebulizer solution Take 0.5 mg by nebulization every 6 (six) hours as needed for wheezing or shortness of breath.    . lisinopril-hydrochlorothiazide (PRINZIDE,ZESTORETIC) 20-25 MG tablet  Take 1 tablet by mouth daily.    . potassium chloride (K-DUR,KLOR-CON) 10 MEQ tablet Take 10 mEq by mouth daily.     No current facility-administered medications for this visit.       Physical Exam:   BP 128/80   Pulse 82   Resp 20   Ht 5\' 11"  (1.803 m)   Wt (!) 432 lb (196 kg)   SpO2 98% Comment: RA  BMI 60.25 kg/m   General:  Morbidly obese but well-appearing  Chest:   Clear to auscultation with symmetrical breath sounds  CV:   Regular rate and rhythm with harsh systolic murmur  Incisions:  n/a  Abdomen:  Soft nontender  Extremities:  Warm and well perfused with severe lower extremity  edema  Diagnostic Tests:  Transesophageal Echocardiography  Patient:    Dewel, Lotter MR #:       [id number] Study Date: 07/16/2017 Gender:     M Age:        46 Height:     180.3 cm Weight:     436 kg BSA:        5 m^2 Pt. Status: Room:   [place] [facility]  [place] [facility]  SONOGRAPHER  Cardell Peach, RDCS  cc:  ------------------------------------------------------------------- LV EF: 55% -   60%  ------------------------------------------------------------------- Indications:      Aortic stenosis 424.1.  ------------------------------------------------------------------- History:   PMH:   Congestive heart failure.  Aortic valve disease. Risk factors:  Hypertension.  ------------------------------------------------------------------- Study Conclusions  - Left ventricle: Systolic function was normal. The estimated   ejection fraction was in the range of 55% to 60%. Wall motion was   normal; there were no regional wall motion abnormalities. - Aortic valve: Bicuspid; mildly thickened, mildly calcified   leaflets. There was severe stenosis. - Mitral valve: No evidence of vegetation. - Right atrium: No evidence of thrombus in the atrial cavity or   appendage. - Atrial septum: No defect or patent foramen ovale was identified. - Tricuspid valve: No evidence of vegetation. - Pulmonic valve: No evidence of vegetation.  Impressions:  - Normal LV systolic function; bicuspid aortic valve with severe AS   (mean gradient 46 mmHg); no AI; mildly dilated aortic root that   appears to be increasing in size in the ascending aorta but   difficult to fully assess; suggest CTA to further evaluate.  ------------------------------------------------------------------- Study data:   Study status:  Routine.  Consent:  The risks, benefits, and alternatives to the  procedure were explained to the patient and informed consent was obtained.  Procedure:  Initial setup. The patient was brought to the laboratory. Surface ECG leads were monitored. Sedation. Conscious sedation was administered. Transesophageal echocardiography. Topical anesthesia was obtained using viscous lidocaine. A transesophageal probe was inserted by the attending cardiologist. Image quality was adequate.  Study completion:  The patient tolerated the procedure well. There were no complications.          Diagnostic transesophageal echocardiography.  2D and color Doppler.  Birthdate:  Patient birthdate: August 13, 1970.  Age:  Patient is 47 yr old.  Sex:  Gender: male.    BMI: 134.1 kg/m^2.  Blood pressure:     121/79  Patient status:  Outpatient.  Study date:  Study date: 07/16/2017. Study time: 10:00 AM.  Location:  Endoscopy.  -------------------------------------------------------------------  ------------------------------------------------------------------- Left ventricle:  Systolic function was normal. The estimated ejection fraction was in the range of 55% to 60%. Wall motion was normal; there were no regional wall motion abnormalities.  ------------------------------------------------------------------- Aortic valve:   Bicuspid; mildly thickened, mildly calcified leaflets.  Doppler:   There was severe stenosis.   There was no significant regurgitation.    Mean gradient (S): 44 mm Hg. Peak gradient (S): 73 mm Hg.  ------------------------------------------------------------------- Aorta:  Aortic root: The aortic root was mildly dilated. Descending aorta: The descending aorta had mild diffuse disease.  ------------------------------------------------------------------- Mitral valve:   Structurally normal valve.   Leaflet separation was normal.  No evidence of vegetation.  Doppler:  There was  trivial regurgitation.  ------------------------------------------------------------------- Left atrium:  The atrium was normal in size.  ------------------------------------------------------------------- Atrial septum:  No defect or patent foramen ovale was identified.   ------------------------------------------------------------------- Right ventricle:  The cavity size was normal. Systolic function was normal.  ------------------------------------------------------------------- Pulmonic valve:    Structurally normal valve.   Cusp separation was normal.  No evidence of vegetation.  ------------------------------------------------------------------- Tricuspid valve:   Structurally normal valve.   Leaflet separation was normal.  No evidence of vegetation.  Doppler:  There was trivial regurgitation.  ------------------------------------------------------------------- Right atrium:  The atrium was normal in size.  No evidence of thrombus in the atrial cavity or appendage.  ------------------------------------------------------------------- Pericardium:  There was no pericardial effusion.  ------------------------------------------------------------------- Measurements   Aortic valve                       Value  Aortic valve peak velocity, S      426   cm/s  Aortic valve mean velocity, S      312   cm/s  Aortic valve VTI, S                83.4  cm  Aortic mean gradient, S            44    mm Hg  Aortic peak gradient, S            73    mm Hg  Legend: (L)  and  (H)  mark values outside specified reference range.  ------------------------------------------------------------------- Prepared and Electronically Authenticated by  Kirk Ruths 2019-02-25T13:06:44       RIGHT HEART CATH AND CORONARY ANGIOGRAPHY  Conclusion   1. No angiographic evidence of CAD 2. Severe aortic stenosis by TEE/TTE.  Due to turbulent flow in the ascending aorta and movement of  the catheters, I could not cross the valve. The patient had a TEE this am that demonstrated severe AS. I did not feel that further attempts at crossing the valve would provide any clinical data that would change his treatment plan, though more attempts increased the risk of procedure complication. 3. Normal right heart pressures  Recommendations: Will continue planning for AVR vs TAVR. Pt to f/u with Dr. Roxy Manns after CT scans.   Indications   Severe aortic stenosis [I35.0 (ICD-10-CM)]  Procedural Details/Technique   Technical Details Indication: 47 yo male smoker with severe aortic stenosis. Planning underway for AVR.   Procedure: The risks, benefits, complications, treatment options, and expected outcomes were discussed with the patient. The patient and/or family concurred with the proposed plan, giving informed consent. The patient was sedated with Versed and Fentanyl. The right antecubital area was prepped and draped. An IV catheter had been placed in the vein upon arrival to the cath lab. I changed out this catheter for a 5  French Slender sheath. Right heart cath performed with a balloon tipped catheter. The right wrist was prepped and draped in a sterile fashion. 1% lidocaine was used for local anesthesia. Using the modified Seldinger access technique, a 5 French sheath was placed in the right radial artery. 3 mg Verapamil was given through the sheath. 6000 units IV heparin was given. Standard diagnostic catheters were used to perform selective coronary angiography. I had difficulty engaging the RCA due to turbulence in the ascending aorta. I ultimately engaged the RCA with a ERAD right catheter. The left main was engaged with the JL3.5 catheter. I attempted to cross the aortic valve with an AL-1 and AL-2 with a straight wire. Due to turbulent flow in the ascending aorta, I could not cross the valve. The patient had a TEE this am that demonstrated severe AS. I did not feel that further attempts at  crossing the valve would provide any clinical data that would change his treatment plan, though more attempts increased the risk of procedure complication. The sheath was removed from the right radial artery and a Terumo hemostasis band was applied at the arteriotomy site on the right wrist.     Estimated blood loss <50 mL.  During this procedure the patient was administered the following to achieve and maintain moderate conscious sedation: Versed 2 mg, Fentanyl 100 mcg, while the patient's heart rate, blood pressure, and oxygen saturation were continuously monitored. The period of conscious sedation was 52 minutes, of which I was present face-to-face 100% of this time.  Complications   Complications documented before study signed (07/16/2017 2:15 PM EST)    RIGHT HEART CATH AND CORONARY ANGIOGRAPHY   None Documented by Burnell Blanks, MD 07/16/2017 1:43 PM EST  Time Range: Intra-procedure      Coronary Findings   Diagnostic  Dominance: Right  Left Anterior Descending  Vessel is large.  First Diagonal Branch  Vessel is small in size.  Second Diagonal Branch  Vessel is small in size.  Left Circumflex  First Obtuse Marginal Branch  Vessel is small in size.  Second Obtuse Marginal Branch  Vessel is large in size.  Third Obtuse Marginal Branch  Vessel is large in size.  Right Coronary Artery  Vessel is large.  Intervention   No interventions have been documented.  Coronary Diagrams   Diagnostic Diagram       Implants     No implant documentation for this case.  MERGE Images   Show images for CARDIAC CATHETERIZATION   Link to Procedure Log   Procedure Log    Hemo Data    Most Recent Value  Fick Cardiac Output 7.37 L/min  Fick Cardiac Output Index 2.51 (L/min)/BSA  RA A Wave 12 mmHg  RA V Wave 7 mmHg  RA Mean 6 mmHg  RV Systolic Pressure 36 mmHg  RV Diastolic Pressure 1 mmHg  RV EDP 12 mmHg  PA Systolic Pressure 34 mmHg  PA Diastolic Pressure 12  mmHg  PA Mean 22 mmHg  PW A Wave 15 mmHg  PW V Wave 13 mmHg  PW Mean 10 mmHg  AO Systolic Pressure 536 mmHg  AO Diastolic Pressure 78 mmHg  AO Mean 94 mmHg  QP/QS 1  TPVR Index 8.78 HRUI  TSVR Index 37.49 HRUI  PVR SVR Ratio 0.14  TPVR/TSVR Ratio 0.23      Cardiac TAVR CT  TECHNIQUE: The patient was scanned on a [employer]. A 120 kV retrospective scan was triggered in the  descending thoracic aorta at 111 HU's. Gantry rotation speed was 250 msecs and collimation was .6 mm. No beta blockade or nitro were given. The 3D data set was reconstructed in 5% intervals of the R-R cycle. Systolic and diastolic phases were analyzed on a dedicated work station using MPR, MIP and VRT modes. The patient received 80 cc of contrast.  FINDINGS: Aortic Valve: Bicuspid aortic valve with mild calcifications and moderate thickening. No calcifications are extending into the LVOT.  Aorta: There is severely dilated ascending aortic aneurysm with maximum diameter 56 mm. Minimal atherosclerosis, no dissection.  Sinotubular Junction: 38 x 37 mm  Ascending Thoracic Aorta: 56 x 56 mm  Aortic Arch: 33 x 30 mm  Descending Thoracic Aorta: 27 x 27 mm  Sinus of Valsalva Measurements: 38 x 32 mm  Coronary Artery Height above Annulus:  Left Main: 20 mm  Right Coronary: 14 mm  Virtual Basal Annulus Measurements:  Maximum/Minimum Diameter: 30.3 x 24.7 mm  Mean Diameter: 27.7 mm  Perimeter: 87.6 mm  Area: 563 mm2  Optimum Fluoroscopic Angle for Delivery:  RAO 0 CRA 0  IMPRESSION: 1. Bicuspid aortic valve with mild calcifications and moderate thickening. No calcifications are extending into the LVOT. Annular measurements very challenging secondary to patients weight, however appear to be suitable for delivery of a 29 mm Edwards-SAPIEN 3 valve.  2. There is severely dilated ascending aortic aneurysm with maximum diameter 56 mm. Minimal atherosclerosis, no  dissection.  3. Sufficient coronary to annulus distance.  4. Optimum Fluoroscopic Angle for Delivery: RAO 0 CRA 0  5. No thrombus in the left atrial appendage.   Electronically Signed   By: Ena Dawley   On: 07/30/2017 11:18   Pulmonary Function Tests  Baseline      Post-bronchodilator  FVC  4.09 L  (76% predicted) FVC  3.85 L  (72% predicted) FEV1  3.17 L  (75% predicted) FEV1  3.17 L  (75% predicted) FEF25-75 2.86 L  (76% predicted) FEF25-75 2.92 L  (77% predicted)  TLC  6.34 L  (88% predicted) RV  2.23 L  (110% predicted) DLCO  75% predicted   STS Risk Calculator  Procedure: Isolated AVR CALCULATE  Risk of Mortality:  1.867%   Renal Failure:  1.448%   Permanent Stroke:  0.168%   Prolonged Ventilation:  10.388%   DSW Infection:  0.381%   Reoperation:  1.964%   Morbidity or Mortality:  13.581%   Short Length of Stay:  39.272%   Long Length of Stay:  4.942%       Impression:  Patient has a bicuspid aortic valve with stage D severe symptomatic aortic stenosis and 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  I have personally reviewed the patient's recent transthoracic and transesophageal echocardiograms, diagnostic cardiac catheterization, and CT angiograms.  Echocardiograms confirmed the presence of a bicuspid aortic valve with severe aortic stenosis.  CT angiogram confirmed the presence of aneurysmal enlargement of the ascending thoracic aorta.  Risks associated with conventional surgery will be high because of the patient's extreme morbid obesity and history of chronic respiratory failure.  However, pulmonary function testing revealed only mild obstructive disease.  Because of the patient's relatively young age and presence of aneurysmal enlargement of the ascending thoracic aorta, I would not recommend transcatheter aortic valve replacement as an alternative to conventional surgery.   Plan:  The patient was counseled at  length regarding treatment alternatives for management of severe aortic stenosis including continued medical therapy versus proceeding  with aortic valve replacement and repair of ascending thoracic aortic aneurysm in the near future.  The natural history of aortic stenosis was reviewed, as was long term prognosis with medical therapy alone.  Surgical options were discussed at length including conventional surgical aortic valve replacement, rapid-deployment bioprosthetic tissue valve replacement, transcatheter aortic valve replacement, and alternatives for management of the ascending aorta including supra coronary straight graft replacement versus Bentall aortic root replacement.   Discussion was held comparing the relative risks of mechanical valve replacement with need for lifelong anticoagulation versus use of a bioprosthetic tissue valve and the associated potential for late structural valve deterioration and failure.  This discussion was placed in the context of the patient's particular circumstances, and as a result the patient specifically requests that their valve be replaced using a bioprosthetic tissue valve.  The patient understands that because of his relatively young age there is a considerable likelihood that his valve might deteriorate and need further surgical intervention during his lifetime.  The patient understands and accepts all potential associated risks of surgery including but not limited to risk of death, stroke, myocardial infarction, congestive heart failure, respiratory failure, renal failure, pneumonia, bleeding requiring blood transfusion and or reexploration, arrhythmia, heart block or bradycardia requiring permanent pacemaker, aortic dissection or other major vascular complication, pleural effusions or other delayed complications related to continued congestive heart failure, and other late complications related to valve replacement including structural valve deterioration and failure,  thrombosis, endocarditis, or paravalvular leak.  We tentatively plan to proceed with surgery on August 23, 2017.  The patient has been instructed to stop smoking immediately.  Expectations for his postoperative convalescence have been discussed.  All of his questions have been addressed.   I spent in excess of 30 minutes during the conduct of this office consultation and >50% of this time involved direct face-to-face encounter with the patient for counseling and/or coordination of their care.    Valentina Gu. Roxy Manns, MD 07/30/2017 1:33 PM

## 2017-07-30 NOTE — Patient Instructions (Signed)
PLAN: 1.  Continue chlorhexidine rinses twice daily as prescribed. 2.  Use salt water rinses every 2 hours while awake in between the chlorhexidine rinses. 3.  Advance diet as tolerated. 4.  Patient is cleared for heart valve surgery with Dr. Roxy Manns as indicated. 5.  Patient is to follow-up with his primary dentist for continued dental therapy, dental restorations, and evaluation for replacement of missing teeth as indicated once medically stable from the anticipated heart valve surgery.  Patient will require antibiotic premedication prior to invasive dental procedures after the heart valve surgery per American Heart Association guidelines.   Lenn Cal, DDS

## 2017-07-30 NOTE — Progress Notes (Signed)
POST OPERATIVE NOTE:  07/30/2017 Reginald [name] [id number]  VITALS: BP (!) 122/51 (BP Location: Left Arm)   Pulse 81   Temp 97.7 F (36.5 C)   LABS:  Lab Results  Component Value Date   WBC 11.3 (H) 07/23/2017   HGB 14.2 07/23/2017   HCT 43.5 07/23/2017   MCV 82.7 07/23/2017   PLT 263 07/23/2017   BMET    Component Value Date/Time   NA 139 07/23/2017 0640   K 3.8 07/23/2017 0640   CL 105 07/23/2017 0640   CO2 23 07/23/2017 0640   GLUCOSE 122 (H) 07/23/2017 0640   BUN 16 07/23/2017 0640   CREATININE 0.81 07/23/2017 0640   CREATININE 0.95 07/05/2017 1656   CALCIUM 8.8 (L) 07/23/2017 0640   GFRNONAA >60 07/23/2017 0640   GFRAA >60 07/23/2017 0640    Lab Results  Component Value Date   INR 1.02 07/16/2017   No results found for: PTT   Reginald [name] is status post extraction of tooth #10 with alveoloplasty and gross debridement of remaining teeth in the operating room with monitored anesthesia care.  The patient now presents for evaluation of healing and suture removal.  SUBJECTIVE: The patient still has some discomfort from the dental extraction site.  Patient is using ibuprofen with good relief.  Patient indicates that the last stitch fell out Saturday.   EXAM: There is no sign of infection, heme, or ooze.  Patient is healing in by primary closure.  All sutures are gone.  PROCEDURE: The patient was given a chlorhexidine gluconate rinse for 30 seconds.  Patient was evaluated for suture removal.  No sutures remain.  Patient tolerated the procedure well.  ASSESSMENT: Post operative course is consistent with dental procedures performed in the operating room and monitored anesthesia care. Loss of tooth #10 with alveoloplasty.  PLAN: 1.  Continue chlorhexidine rinses twice daily as prescribed. 2.  Use salt water rinses every 2 hours while awake in between the chlorhexidine rinses. 3.  Advance diet as tolerated. 4.  Patient is cleared for heart valve surgery with Dr.  Roxy Manns as indicated. 5.  Patient is to follow-up with his primary dentist for continued dental therapy, dental restorations, and evaluation for replacement of missing teeth as indicated once medically stable from the anticipated heart valve surgery.  Patient will require antibiotic premedication prior to invasive dental procedures after the heart valve surgery per American Heart Association guidelines.   Lenn Cal, DDS

## 2017-07-30 NOTE — Patient Instructions (Addendum)
Stop smoking immediately and permanently.  Stop taking aspirin 2 weeks prior to surgery  Continue taking all other medications without change through the day before surgery.  Have nothing to eat or drink after midnight the night before surgery.  On the morning of surgery take only Zyloprim and Norvasc with a sip of water.  You may use your inhaler.

## 2017-08-20 NOTE — Pre-Procedure Instructions (Signed)
Reginald [name]  08/20/2017      [facility] - [place], [place], [place] [address] [place] [address] Phone: [phone] Fax: [phone]    Your procedure is scheduled on Thursday April 4.  Report to [facility] Admitting at 5:30 A.M.  Call this number if you have problems the morning of surgery:  [phone]   Remember:  Do not eat food or drink liquids after midnight.  Take these medicines the morning of surgery with A SIP OF WATER:   amlodipine (norvasc) Allopurinol (Zyloprim) Hydrocodone-acetaminophen (tylenol) if needed Clonazepam (Klonopin) if needed Albuterol if needed (please bring your inhaler with you the day of surgery)  7 days prior to surgery STOP taking any Aleve, Naproxen, Ibuprofen, Motrin, Advil, Goody's, BC's, all herbal medications, fish oil, and all vitamins  **FOLLOW your surgeon's instructions on stopping Aspirin. If no instructions were given, please call your surgeon's office**    Do not wear jewelry, make-up or nail polish.  Do not wear lotions, powders, or perfumes, or deodorant.  Do not shave 48 hours prior to surgery.  Men may shave face and neck.  Do not bring valuables to the hospital.  [facility] is not responsible for any belongings or valuables.  Contacts, dentures or bridgework may not be worn into surgery.  Leave your suitcase in the car.  After surgery it may be brought to your room.  For patients admitted to the hospital, discharge time will be determined by your treatment team.  Patients discharged the day of surgery will not be allowed to drive home.   Special instructions:    [place]- Preparing For Surgery  Before surgery, you can play an important role. Because skin is not sterile, your skin needs to be as free of germs as possible. You can reduce the number of germs on your skin by washing with CHG (chlorahexidine gluconate) Soap before surgery.  CHG is an antiseptic cleaner which  kills germs and bonds with the skin to continue killing germs even after washing.  Please do not use if you have an allergy to CHG or antibacterial soaps. If your skin becomes reddened/irritated stop using the CHG.  Do not shave (including legs and underarms) for at least 48 hours prior to first CHG shower. It is OK to shave your face.  Please follow these instructions carefully.   1. Shower the NIGHT BEFORE SURGERY and the MORNING OF SURGERY with CHG.   2. If you chose to wash your hair, wash your hair first as usual with your normal shampoo.  3. After you shampoo, rinse your hair and body thoroughly to remove the shampoo.  4. Use CHG as you would any other liquid soap. You can apply CHG directly to the skin and wash gently with a scrungie or a clean washcloth.   5. Apply the CHG Soap to your body ONLY FROM THE NECK DOWN.  Do not use on open wounds or open sores. Avoid contact with your eyes, ears, mouth and genitals (private parts). Wash Face and genitals (private parts)  with your normal soap.  6. Wash thoroughly, paying special attention to the area where your surgery will be performed.  7. Thoroughly rinse your body with warm water from the neck down.  8. DO NOT shower/wash with your normal soap after using and rinsing off the CHG Soap.  9. Pat yourself dry with a CLEAN TOWEL.  10. Wear CLEAN  PAJAMAS to bed the night before surgery, wear comfortable clothes the morning of surgery  11. Place CLEAN SHEETS on your bed the night of your first shower and DO NOT SLEEP WITH PETS.    Day of Surgery: Do not apply any deodorants/lotions. Please wear clean clothes to the hospital/surgery center.      Please read over the following fact sheets that you were given. Coughing and Deep Breathing, MRSA Information and Surgical Site Infection Prevention

## 2017-08-21 ENCOUNTER — Encounter (HOSPITAL_COMMUNITY)
Admission: RE | Admit: 2017-08-21 | Discharge: 2017-08-21 | Disposition: A | Discharge status: Home / Self Care | Payer: BLUE CROSS/BLUE SHIELD | Source: Ambulatory Visit | Attending: Thoracic Surgery (Cardiothoracic Vascular Surgery) | Admitting: Thoracic Surgery (Cardiothoracic Vascular Surgery)

## 2017-08-21 ENCOUNTER — Ambulatory Visit (HOSPITAL_COMMUNITY)
Admission: RE | Admit: 2017-08-21 | Discharge: 2017-08-21 | Disposition: A | Discharge status: Home / Self Care | Payer: BLUE CROSS/BLUE SHIELD | Source: Ambulatory Visit | Attending: Thoracic Surgery (Cardiothoracic Vascular Surgery) | Admitting: Thoracic Surgery (Cardiothoracic Vascular Surgery)

## 2017-08-21 ENCOUNTER — Other Ambulatory Visit: Payer: Self-pay

## 2017-08-21 ENCOUNTER — Encounter (HOSPITAL_COMMUNITY): Payer: Self-pay

## 2017-08-21 DIAGNOSIS — Z0183 Encounter for blood typing: Secondary | ICD-10-CM | POA: Diagnosis not present

## 2017-08-21 DIAGNOSIS — I712 Thoracic aortic aneurysm, without rupture: Secondary | ICD-10-CM | POA: Diagnosis not present

## 2017-08-21 DIAGNOSIS — J449 Chronic obstructive pulmonary disease, unspecified: Secondary | ICD-10-CM | POA: Diagnosis not present

## 2017-08-21 DIAGNOSIS — Z01812 Encounter for preprocedural laboratory examination: Secondary | ICD-10-CM | POA: Insufficient documentation

## 2017-08-21 DIAGNOSIS — I7121 Aneurysm of the ascending aorta, without rupture: Secondary | ICD-10-CM

## 2017-08-21 DIAGNOSIS — I350 Nonrheumatic aortic (valve) stenosis: Secondary | ICD-10-CM | POA: Insufficient documentation

## 2017-08-21 DIAGNOSIS — I6523 Occlusion and stenosis of bilateral carotid arteries: Secondary | ICD-10-CM | POA: Diagnosis not present

## 2017-08-21 DIAGNOSIS — Z01818 Encounter for other preprocedural examination: Secondary | ICD-10-CM | POA: Diagnosis present

## 2017-08-21 DIAGNOSIS — I517 Cardiomegaly: Secondary | ICD-10-CM | POA: Diagnosis not present

## 2017-08-21 LAB — BLOOD GAS, ARTERIAL
ACID-BASE EXCESS: 5.1 mmol/L — AB (ref 0.0–2.0)
BICARBONATE: 28.4 mmol/L — AB (ref 20.0–28.0)
DRAWN BY: 421801
FIO2: 21
O2 SAT: 97.6 %
PCO2 ART: 36.8 mmHg (ref 32.0–48.0)
Patient temperature: 98.6
pH, Arterial: 7.5 — ABNORMAL HIGH (ref 7.350–7.450)
pO2, Arterial: 87.7 mmHg (ref 83.0–108.0)

## 2017-08-21 LAB — SURGICAL PCR SCREEN
MRSA, PCR: NEGATIVE
Staphylococcus aureus: NEGATIVE

## 2017-08-21 LAB — CBC
HCT: 44.2 % (ref 39.0–52.0)
Hemoglobin: 14.5 g/dL (ref 13.0–17.0)
MCH: 27.1 pg (ref 26.0–34.0)
MCHC: 32.8 g/dL (ref 30.0–36.0)
MCV: 82.6 fL (ref 78.0–100.0)
PLATELETS: 276 10*3/uL (ref 150–400)
RBC: 5.35 MIL/uL (ref 4.22–5.81)
RDW: 15.9 % — AB (ref 11.5–15.5)
WBC: 10.5 10*3/uL (ref 4.0–10.5)

## 2017-08-21 LAB — URINALYSIS, ROUTINE W REFLEX MICROSCOPIC
Bilirubin Urine: NEGATIVE
GLUCOSE, UA: NEGATIVE mg/dL
HGB URINE DIPSTICK: NEGATIVE
Ketones, ur: NEGATIVE mg/dL
LEUKOCYTES UA: NEGATIVE
Nitrite: NEGATIVE
Protein, ur: NEGATIVE mg/dL
SPECIFIC GRAVITY, URINE: 1.008 (ref 1.005–1.030)
pH: 7 (ref 5.0–8.0)

## 2017-08-21 LAB — COMPREHENSIVE METABOLIC PANEL
ALT: 25 U/L (ref 17–63)
AST: 29 U/L (ref 15–41)
Albumin: 3.7 g/dL (ref 3.5–5.0)
Alkaline Phosphatase: 79 U/L (ref 38–126)
Anion gap: 11 (ref 5–15)
BUN: 15 mg/dL (ref 6–20)
CHLORIDE: 100 mmol/L — AB (ref 101–111)
CO2: 24 mmol/L (ref 22–32)
Calcium: 9.1 mg/dL (ref 8.9–10.3)
Creatinine, Ser: 0.78 mg/dL (ref 0.61–1.24)
Glucose, Bld: 110 mg/dL — ABNORMAL HIGH (ref 65–99)
Potassium: 3.2 mmol/L — ABNORMAL LOW (ref 3.5–5.1)
Sodium: 135 mmol/L (ref 135–145)
TOTAL PROTEIN: 7.6 g/dL (ref 6.5–8.1)
Total Bilirubin: 0.5 mg/dL (ref 0.3–1.2)

## 2017-08-21 LAB — TYPE AND SCREEN
ABO/RH(D): A NEG
Antibody Screen: NEGATIVE

## 2017-08-21 LAB — HEMOGLOBIN A1C
HEMOGLOBIN A1C: 5.4 % (ref 4.8–5.6)
MEAN PLASMA GLUCOSE: 108.28 mg/dL

## 2017-08-21 LAB — PROTIME-INR
INR: 1.03
PROTHROMBIN TIME: 13.4 s (ref 11.4–15.2)

## 2017-08-21 LAB — APTT: aPTT: 29 seconds (ref 24–36)

## 2017-08-21 LAB — ABO/RH: ABO/RH(D): A NEG

## 2017-08-21 NOTE — Progress Notes (Addendum)
Pre-op Cardiac Surgery  Carotid Findings:  Findings suggest 1-39% internal carotid artery stenosis bilaterally. The left vertebral artery is patent with antegrade flow. Unable to visualize the right vertebral artery.  Upper Extremity Right Left  Brachial Pressures 142-Triphasic 138-Triphasic  Radial Waveforms Triphasic Triphasic  Ulnar Waveforms Triphasic Triphasic  Palmar Arch (Allen's Test) Within normal limits Signal is unaffected with radial compression, obliterates with ulnar compression.    08/21/2017 10:14 AM Maudry Mayhew, BS, RVT, RDCS, RDMS

## 2017-08-21 NOTE — Progress Notes (Signed)
PCP - Dr. Monico Blitz   Cardiologist - Dr. Bronson Ing  Chest x-ray - 08/21/17  EKG - 08/21/17  Stress Test - 05/25/17 (E)  ECHO - 07/16/17 (E)  Cardiac Cath - 07/16/17 (E)  Sleep Study - Denies CPAP - Yes- Never had the test, but was given and has been using the machine for years  LABS- 08/21/17: CBC, CMP, PT, PTT, ABG, T/S, UA  HA1C- 08/21/17  Anesthesia- Yes- Cardiac history  Pt denies having chest pain, sob, or fever at this time. All instructions explained to the pt, with a verbal understanding of the material. Pt agrees to go over the instructions while at home for a better understanding. The opportunity to ask questions was provided.

## 2017-08-21 NOTE — H&P (Signed)
[address]       [place] [address]             [phone]          CARDIOTHORACIC SURGERY HISTORY AND PHYSICAL EXAM  Referring Provider is Herminio Commons, MD PCP is Monico Blitz, MD      Chief Complaint  Patient presents with  . Aortic Stenosis    New patient, consultation, ECHO 05/25/2017    HPI:  Patient is a 47 year old morbidly obese male with long-standing history of heart murmur, chronic diastolic congestive heart failure, hypertension, chronic venous insufficiency, obstructive sleep apnea on CPAP, and long-standing tobacco abuse with COPD who has been referred for surgical consultation to discuss treatment options for management of suspected bicuspid aortic valve with severe aortic stenosis.  Patient states that he has been told he had a heart murmur essentially all of his life and he was told in the past that he probably had a bicuspid aortic valve.  He describes a long history of symptoms of exertional shortness of breath which have progressed substantially over the past 6 months.  He was referred for cardiology consultation and initially seen in consultation by Dr. Bronson Ing on May 10, 2017.  He underwent transthoracic echocardiogram on May 25, 2017.  Echocardiogram was reported to demonstrate the presence of severe aortic stenosis with normal left ventricular systolic function.  Cardiothoracic surgical consultation was requested.  Patient is single and lives with his nephew in [place].  He works full-time as a Administrator on a local route.  He describes a long history of symptoms of exertional shortness of breath and fatigue which has progressed substantially over the past 6 months.  He now gets short of breath with minimal activity and occasionally at rest.  He has developed some exertional tightness across his chest.  He has had dizzy spells without syncope.  He has had chronic severe swelling in both lower legs with  previous history of transudate of drainage through the skin of both legs related to severe chronic venous insufficiency.  Lower extremity swelling have improved with compression stockings and diuretic therapy.  He has been morbidly obese for all of his adult life.  He states that his weight has gotten up as high as 460-470 pounds.  He is not sure what he weighs presently.  His mobility is limited by severe chronic fatigue and exertional shortness of breath.  He does not have significant arthritis or arthralgias.  He still works full-time driving his truck.  This does not require any lifting including no loading or unloading of the truck.  Patient is a 47 year old morbidly obese male with multiple medical problems who returns to the office today for follow-up of likely bicuspid aortic valve with severe symptomatic aortic stenosis.  He was originally seen in consultation on July 05, 2017.  Since then he underwent TEE and diagnostic cardiac catheterization on July 16, 2017.  TEE confirmed the presence of bicuspid aortic valve with severe aortic stenosis.  Mean transvalvular gradient across the aortic valve was estimated 46 mmHg.  Left ventricular systolic function appeared normal with ejection fraction estimated 55-60%.  The ascending aorta appeared dilated but was difficult to image.  No other significant abnormalities were noted.  Catheterization was notable for the absence of significant coronary artery disease.  The aortic valve could not be crossed.  There were normal right-sided pressures.  The patient subsequently underwent CT angiography and pulmonary function tests,  and he returns to our office today to discuss treatment options further.  He reports no new problems or complaints.  He gets short of breath quite easily with low level activity.  He is not having resting shortness of breath.  He states that recently his weight has been trending up and he had increased swelling.  He took some extra Lasix  and symptoms improved.  He has had some mild vague tightness across his chest that does not seem to be related to activity, and he admits may be related to anxiety.   Past Medical History:  Diagnosis Date  . Anxiety   . Aortic stenosis   . Asthma   . Back pain   . Cellulitis of skin with lymphangitis   . CHF (congestive heart failure) ([facility])   . Chronic diastolic congestive heart failure ([facility])   . Chronic venous insufficiency   . Constipation   . COPD (chronic obstructive pulmonary disease) ([facility])   . Dyspnea   . Essential hypertension   . Gout   . Hypertension   . Morbid obesity ([facility])   . Obesity   . Pneumonia    walking pneumonia  . Prostatitis   . Sleep apnea    cpap   . Thoracic ascending aortic aneurysm ([facility])   . Tobacco abuse     Past Surgical History:  Procedure Laterality Date  . MULTIPLE EXTRACTIONS WITH ALVEOLOPLASTY N/A 07/23/2017   Procedure: Extraction of tooth #10 with alveoloplasty and gross debridement of remaining teeth;  Surgeon: Lenn Cal, DDS;  Location: [facility];  Service: Oral Surgery;  Laterality: N/A;  . NO PAST SURGERIES    . TEE WITHOUT CARDIOVERSION N/A 07/16/2017   Procedure: TRANSESOPHAGEAL ECHOCARDIOGRAM (TEE);  Surgeon: Lelon Perla, MD;  Location: [facility] ENDOSCOPY;  Service: Cardiovascular;  Laterality: N/A;    Family History  Problem Relation Age of Onset  . Hypertension Mother   . Alzheimer's disease Mother   . Heart attack Brother   . Diabetes Brother     Social History Social History   Tobacco Use  . Smoking status: Current Every Day Smoker    Packs/day: 1.00    Types: Cigarettes  . Smokeless tobacco: Never Used  Substance Use Topics  . Alcohol use: No    Frequency: Never    Comment: have had alcohol in the past, not heavy  . Drug use: No    Prior to Admission medications   Medication Sig Start Date End Date Taking? Authorizing Provider  albuterol (PROVENTIL HFA;VENTOLIN HFA) 108 (90 Base) MCG/ACT inhaler Inhale 2  puffs into the lungs every 6 (six) hours as needed for wheezing or shortness of breath.    Yes [provider]  allopurinol (ZYLOPRIM) 300 MG tablet Take 300 mg by mouth daily.   Yes [provider]  amLODipine (NORVASC) 10 MG tablet Take 10 mg by mouth daily.   Yes [provider]  chlorhexidine (PERIDEX) 0.12 % solution Rinse with 15 mls twice daily for 30 seconds. Use after breakfast and at bedtime. Spit out excess. Do not swallow. 07/18/17  Yes Lenn Cal, DDS  citalopram (CELEXA) 20 MG tablet Take 20 mg by mouth at bedtime. 08/12/17  Yes [provider]  clonazePAM (KLONOPIN) 1 MG tablet Take 0.5-1 mg by mouth every 8 (eight) hours as needed for anxiety.   Yes [provider]  docusate sodium (COLACE) 100 MG capsule Take 200 mg by mouth daily.    Yes [provider]  furosemide (LASIX) 40 MG tablet Take 1 tablet (40 mg total) by mouth daily. MAY TAKE AN EXTRA 40 MG AS NEEDED Patient taking differently: Take 80 mg by mouth daily. MAY TAKE AN EXTRA 40 MG AS NEEDED 06/19/17  Yes Herminio Commons, MD  ibuprofen (ADVIL,MOTRIN) 800 MG tablet Take 800 mg by mouth 2 (two) times daily as needed for headache or moderate pain.    Yes [provider]  ipratropium (ATROVENT) 0.02 % nebulizer solution Take 0.5 mg by nebulization every 6 (six) hours as needed for wheezing or shortness of breath.   Yes [provider]  lisinopril-hydrochlorothiazide (PRINZIDE,ZESTORETIC) 20-25 MG tablet Take 1 tablet by mouth daily.   Yes [provider]  potassium chloride (K-DUR,KLOR-CON) 10 MEQ tablet Take 10 mEq by mouth daily.   Yes [provider]  Probiotic Product (PROBIOTIC PO) Take 1 capsule by mouth at bedtime.   Yes [provider]  aspirin EC 81 MG tablet Take 81 mg by mouth once.    [provider]  HYDROcodone-acetaminophen (NORCO) 5-325 MG tablet Take 1-2 tablets by mouth every 6 (six) hours as  needed for moderate pain or severe pain. Patient not taking: Reported on 08/14/2017 07/23/17   Lenn Cal, DDS    No Known Allergies  Review of Systems:              General:                      normal appetite, decreased energy, + weight gain, + weight loss, no fever             Cardiac:                       + chest pain with exertion, no chest pain at rest, + SOB with exertion, + occasional resting SOB, no PND, + orthopnea, no palpitations, no arrhythmia, no atrial fibrillation, + LE edema, + dizzy spells, no syncope             Respiratory:                 + chronic shortness of breath, no home oxygen, no productive cough, no dry cough, + bronchitis, no wheezing, no hemoptysis, + asthma, no pain with inspiration or cough, + sleep apnea, + CPAP at night             GI:                               no difficulty swallowing, no reflux, no frequent heartburn, no hiatal hernia, no abdominal pain, + constipation, no diarrhea, no hematochezia, no hematemesis, no melena             GU:                              no dysuria,  + frequency, no urinary tract infection, no hematuria, no enlarged prostate, no kidney stones, no kidney disease             Vascular:                     no pain suggestive of claudication, no pain in feet, no leg cramps, no varicose veins, no DVT, no non-healing foot ulcer  Neuro:                         no stroke, no TIA's, no seizures, no headaches, no temporary blindness one eye,  no slurred speech, no peripheral neuropathy, no chronic pain, no instability of gait, no memory/cognitive dysfunction             Musculoskeletal:         no arthritis, no joint swelling, no myalgias, no difficulty walking, normal mobility              Skin:                            no rash, no itching, no skin infections, no pressure sores or ulcerations             Psych:                         no anxiety, no depression, no nervousness, no unusual recent stress              Eyes:                           no blurry vision, no floaters, no recent vision changes, + wears glasses or contacts             ENT:                            no hearing loss, poor dentition but no loose or painful teeth, no dentures, last saw dentist many years ago             Hematologic:               no easy bruising, no abnormal bleeding, no clotting disorder, no frequent epistaxis             Endocrine:                   no diabetes, does not check CBG's at home                                                       Physical Exam:              BP (!) 148/84 (BP Location: Right Arm, Patient Position: Sitting, Cuff Size: Large) Comment (Cuff Size): forearm  Pulse 95   Resp 18   Ht 5\' 11"  (1.803 m)   Wt (!) 436 lb 12.8 oz (198.1 kg)   SpO2 97% Comment: RA  BMI 60.92 kg/m              General:                      Morbidly obese male NAD              HEENT:                       Unremarkable              Neck:  no JVD, no bruits, no adenopathy              Chest:                          clear to auscultation, symmetrical breath sounds, no wheezes, no rhonchi              CV:                              Distant heart sounds, RRR, grade II/VI crescendo/decrescendo murmur heard best at RSB,  no diastolic murmur             Abdomen:                    soft, non-tender, no masses              Extremities:                 warm, adequately-perfused, pulses not palpable, + bilateral LE edema             Rectal/GU                   Deferred             Neuro:                         Grossly non-focal and symmetrical throughout             Skin:                            Clean and dry, no rashes, no breakdown, + changes of chronic venous insufficiency both lower legs   Diagnostic Tests:  Transthoracic Echocardiography  Patient: Reginald [name], Reginald [name] MR #: [id number] Study Date: 05/25/2017 Gender: M Age: 59 Height: 180.3 cm Weight:  200.5 kg BSA: 3.29 m^2 Pt. Status: Room:  SONOGRAPHER [facility] ATTENDING Kate Sable, MD [place], MD [place], MD PERFORMING Rayford Halsted  cc:  ------------------------------------------------------------------- LV EF: 60% - 65%  ------------------------------------------------------------------- Indications: Aortic stenosis 424.1.  ------------------------------------------------------------------- History: PMH: 30 MIN POST DEFINITY BP:126/82. Possible Bicuspid Aortic Valve. Congestive heart failure. Chronic obstructive pulmonary disease. Risk factors: Current tobacco use. Hypertension.  ------------------------------------------------------------------- Study Conclusions  - Left ventricle: The cavity size was normal. Wall thickness was increased in a pattern of moderate LVH. Systolic function was normal. The estimated ejection fraction was in the range of 60% to 65%. Wall motion was normal; there were no regional wall motion abnormalities. Left ventricular diastolic function parameters were normal. - Aortic valve: There was severe stenosis. Mean gradient (S): 48 mm Hg. Valve area (VTI): 0.99 cm^2. Valve area (Vmax): 0.98 cm^2. Valve area (Vmean): 0.88 cm^2. - Aorta: The visualized portion of the proximal ascending aorts is severely dilated at 5.2 cm. Recommend CTA or MRA to better evaluate. - Technically difficult study, echocontrast was used to enhance visualization.  ------------------------------------------------------------------- Study data: Comparison was made to the study of 02/19/2017. Study status: Routine. Procedure: Transthoracic echocardiography. Image quality was poor. The study was technically difficult, as a result of body habitus. Intravenous contrast (Definity) was administered. Study completion: There were no  complications. Transthoracic echocardiography. M-mode, complete 2D, spectral Doppler, and color Doppler. Birthdate: Patient birthdate: 02-21-1971. Age: Patient is 47 yr old. Sex: Gender:  male. BMI: 61.7 kg/m^2. Blood pressure: 111/73 Patient status: Outpatient. Study date: Study date: 05/25/2017. Study time: 09:44 AM. Location: Echo laboratory.  -------------------------------------------------------------------  ------------------------------------------------------------------- Left ventricle: The cavity size was normal. Wall thickness was increased in a pattern of moderate LVH. Systolic function was normal. The estimated ejection fraction was in the range of 60% to 65%. Wall motion was normal; there were no regional wall motion abnormalities. Left ventricular diastolic function parameters were normal.  ------------------------------------------------------------------- Aortic valve: Poorly visualized. Uncertain number of leaflets. Doppler: There was severe stenosis. There was no significant regurgitation. VTI ratio of LVOT to aortic valve: 0.31. Valve area (VTI): 0.99 cm^2. Indexed valve area (VTI): 0.3 cm^2/m^2. Peak velocity ratio of LVOT to aortic valve: 0.31. Valve area (Vmax): 0.98 cm^2. Indexed valve area (Vmax): 0.3 cm^2/m^2. Mean velocity ratio of LVOT to aortic valve: 0.28. Valve area (Vmean): 0.88 cm^2. Indexed valve area (Vmean): 0.27 cm^2/m^2. Mean gradient (S): 48 mm Hg. Peak gradient (S): 75 mm Hg.  ------------------------------------------------------------------- Aorta: The visualized portion of the proximal ascending aorts is severely dilated at 5.2 cm. Recommend CTA or MRA to better evaluate. Aortic root: The aortic root was normal in size.  ------------------------------------------------------------------- Mitral valve: Normal thickness leaflets . Doppler: There was no evidence for stenosis. There was no  significant regurgitation. Peak gradient (D): 4 mm Hg.  ------------------------------------------------------------------- Left atrium: The atrium was normal in size.  ------------------------------------------------------------------- Right ventricle: Poorly visualized. Grossly appears normal in size and function. Systolic function was normal.  ------------------------------------------------------------------- Pulmonic valve: Not well visualized. Doppler: There was no evidence for stenosis. There was no significant regurgitation.  ------------------------------------------------------------------- Tricuspid valve: Normal thickness leaflets. Doppler: There was no evidence for stenosis. There was no significant regurgitation.  ------------------------------------------------------------------- Pulmonary artery: Systolic pressure could not be accurately estimated. Inadequate TR jet.  ------------------------------------------------------------------- Right atrium: The atrium was normal in size.  ------------------------------------------------------------------- Pericardium: There was no pericardial effusion.  ------------------------------------------------------------------- Systemic veins: Inferior vena cava: The vessel was dilated. The respirophasic diameter changes were blunted (< 50%), consistent with elevated central venous pressure.  ------------------------------------------------------------------- Post procedure conclusions Ascending Aorta:  - The visualized portion of the proximal ascending aorts is severely dilated at 5.2 cm. Recommend CTA or MRA to better evaluate.  ------------------------------------------------------------------- Measurements  Left ventricle Value Reference LV ID, ED, PLAX chordal (H) 53 mm 43 - 52 LV ID, ES, PLAX chordal  31.2 mm 23 - 38 LV fx shortening, PLAX chordal 41 % >=29 LV PW thickness, ED 12 mm --------- IVS/LV PW ratio, ED 1.05 <=1.3  Ventricular septum Value Reference IVS thickness, ED 12.6 mm ---------  LVOT Value Reference LVOT ID, S 20 mm --------- LVOT area 3.14 cm^2 --------- LVOT peak velocity, S 136 cm/s --------- LVOT mean velocity, S 91.2 cm/s --------- LVOT VTI, S 28.3 cm --------- LVOT peak gradient, S 7 mm Hg ---------  Aortic valve Value Reference Aortic valve peak velocity, S 434 cm/s --------- Aortic valve mean velocity, S 327 cm/s --------- Aortic valve VTI, S 90.2 cm --------- Aortic mean gradient, S 48 mm Hg --------- Aortic peak gradient, S 75 mm Hg --------- VTI ratio, LVOT/AV 0.31 --------- Aortic valve area, VTI 0.99 cm^2 --------- Aortic valve area/bsa, VTI 0.3 cm^2/m^2 --------- Velocity ratio, peak, LVOT/AV 0.31 --------- Aortic valve area, peak velocity 0.98 cm^2 --------- Aortic valve area/bsa, peak 0.3 cm^2/m^2 --------- velocity Velocity ratio, mean, LVOT/AV 0.28 --------- Aortic valve area, mean velocity 0.88 cm^2 --------- Aortic valve area/bsa, mean 0.27 cm^2/m^2 --------- velocity  Aorta  Value Reference Aortic root ID,  ED 34 mm ---------  Left atrium Value Reference LA ID, A-P, ES 43 mm --------- LA volume/bsa, ES, 1-p A4C 24.2 ml/m^2 ---------  Mitral valve Value Reference Mitral E-wave peak velocity 103 cm/s --------- Mitral A-wave peak velocity 106 cm/s --------- Mitral deceleration time 222 ms 150 - 230 Mitral peak gradient, D 4 mm Hg --------- Mitral E/A ratio, peak 1 ---------  Right ventricle Value Reference TAPSE 31.9 mm --------- RV s&', lateral, S 17.4 cm/s ---------  Legend: (L) and (H) mark values outside specified reference range.  ------------------------------------------------------------------- Prepared and Electronically Authenticated by  Kerry Hough, M.D. 2019-01-04T16:15:43   Transesophageal Echocardiography  Patient: Reginald [name], Reginald [name] MR #: [id number] Study Date: 07/16/2017 Gender: M Age: 25 Height: 180.3 cm Weight: 436 kg BSA: 5 m^2 Pt. Status: Room:  [facility] [place] [facility] SONOGRAPHER Cardell Peach, RDCS  cc:  ------------------------------------------------------------------- LV EF: 55% - 60%  ------------------------------------------------------------------- Indications: Aortic stenosis 424.1.  ------------------------------------------------------------------- History: PMH: Congestive heart  failure. Aortic valve disease. Risk factors: Hypertension.  ------------------------------------------------------------------- Study Conclusions  - Left ventricle: Systolic function was normal. The estimated ejection fraction was in the range of 55% to 60%. Wall motion was normal; there were no regional wall motion abnormalities. - Aortic valve: Bicuspid; mildly thickened, mildly calcified leaflets. There was severe stenosis. - Mitral valve: No evidence of vegetation. - Right atrium: No evidence of thrombus in the atrial cavity or appendage. - Atrial septum: No defect or patent foramen ovale was identified. - Tricuspid valve: No evidence of vegetation. - Pulmonic valve: No evidence of vegetation.  Impressions:  - Normal LV systolic function; bicuspid aortic valve with severe AS (mean gradient 46 mmHg); no AI; mildly dilated aortic root that appears to be increasing in size in the ascending aorta but difficult to fully assess; suggest CTA to further evaluate.  ------------------------------------------------------------------- Study data: Study status: Routine. Consent: The risks, benefits, and alternatives to the procedure were explained to the patient and informed consent was obtained. Procedure: Initial setup. The patient was brought to the laboratory. Surface ECG leads were monitored. Sedation. Conscious sedation was administered. Transesophageal echocardiography. Topical anesthesia was obtained using viscous lidocaine. A transesophageal probe was inserted by the attending cardiologist. Image quality was adequate. Study completion: The patient tolerated the procedure well. There were no complications. Diagnostic transesophageal echocardiography. 2D and color Doppler. Birthdate: Patient birthdate: 11-20-1970. Age: Patient is 46 yr old. Sex: Gender: male. BMI: 134.1 kg/m^2. Blood pressure: 121/79 Patient status:  Outpatient. Study date: Study date: 07/16/2017. Study time: 10:00 AM. Location: Endoscopy.  -------------------------------------------------------------------  ------------------------------------------------------------------- Left ventricle: Systolic function was normal. The estimated ejection fraction was in the range of 55% to 60%. Wall motion was normal; there were no regional wall motion abnormalities.  ------------------------------------------------------------------- Aortic valve: Bicuspid; mildly thickened, mildly calcified leaflets. Doppler: There was severe stenosis. There was no significant regurgitation. Mean gradient (S): 44 mm Hg. Peak gradient (S): 73 mm Hg.  ------------------------------------------------------------------- Aorta: Aortic root: The aortic root was mildly dilated. Descending aorta: The descending aorta had mild diffuse disease.  ------------------------------------------------------------------- Mitral valve: Structurally normal valve. Leaflet separation was normal. No evidence of vegetation. Doppler: There was trivial regurgitation.  ------------------------------------------------------------------- Left atrium: The atrium was normal in size.  ------------------------------------------------------------------- Atrial septum: No defect or patent foramen ovale was identified.  ------------------------------------------------------------------- Right ventricle: The cavity size was normal. Systolic function was normal.  ------------------------------------------------------------------- Pulmonic valve: Structurally normal valve. Cusp separation was normal. No evidence of vegetation.  -------------------------------------------------------------------  Tricuspid valve: Structurally normal valve. Leaflet separation was normal. No evidence of vegetation. Doppler: There was trivial  regurgitation.  ------------------------------------------------------------------- Right atrium: The atrium was normal in size. No evidence of thrombus in the atrial cavity or appendage.  ------------------------------------------------------------------- Pericardium: There was no pericardial effusion.  ------------------------------------------------------------------- Measurements  Aortic valve Value Aortic valve peak velocity, S 426 cm/s Aortic valve mean velocity, S 312 cm/s Aortic valve VTI, S 83.4 cm Aortic mean gradient, S 44 mm Hg Aortic peak gradient, S 73 mm Hg  Legend: (L) and (H) mark values outside specified reference range.  ------------------------------------------------------------------- Prepared and Electronically Authenticated by  Kirk Ruths 2019-02-25T13:06:44       RIGHT HEART CATH AND CORONARY ANGIOGRAPHY  Conclusion   1. No angiographic evidence of CAD 2. Severe aortic stenosis by TEE/TTE. Due to turbulent flow in the ascending aorta and movement of the catheters, I could not cross the valve. The patient had a TEE this am that demonstrated severe AS. I did not feel that further attempts at crossing the valve would provide any clinical data that would change his treatment plan, though more attempts increased the risk of procedure complication. 3. Normal right heart pressures  Recommendations: Will continue planning for AVR vs TAVR. Pt to f/u with Dr. Roxy Manns after CT scans.   Indications   Severe aortic stenosis [I35.0 (ICD-10-CM)]  Procedural Details/Technique   Technical Details Indication: 47 yo male smoker with severe aortic stenosis. Planning underway for AVR.   Procedure: The risks, benefits, complications, treatment options, and expected outcomes were discussed with the patient. The patient and/or family concurred with the proposed  plan, giving informed consent. The patient was sedated with Versed and Fentanyl. The right antecubital area was prepped and draped. An IV catheter had been placed in the vein upon arrival to the cath lab. I changed out this catheter for a 5 French Slender sheath. Right heart cath performed with a balloon tipped catheter. The right wrist was prepped and draped in a sterile fashion. 1% lidocaine was used for local anesthesia. Using the modified Seldinger access technique, a 5 French sheath was placed in the right radial artery. 3 mg Verapamil was given through the sheath. 6000 units IV heparin was given. Standard diagnostic catheters were used to perform selective coronary angiography. I had difficulty engaging the RCA due to turbulence in the ascending aorta. I ultimately engaged the RCA with a ERAD right catheter. The left main was engaged with the JL3.5 catheter. I attempted to cross the aortic valve with an AL-1 and AL-2 with a straight wire. Due to turbulent flow in the ascending aorta, I could not cross the valve. The patient had a TEE this am that demonstrated severe AS. I did not feel that further attempts at crossing the valve would provide any clinical data that would change his treatment plan, though more attempts increased the risk of procedure complication. The sheath was removed from the right radial artery and a Terumo hemostasis band was applied at the arteriotomy site on the right wrist.     Estimated blood loss <50 mL.  During this procedure the patient was administered the following to achieve and maintain moderate conscious sedation: Versed 2 mg, Fentanyl 100 mcg, while the patient's heart rate, blood pressure, and oxygen saturation were continuously monitored. The period of conscious sedation was 52 minutes, of which I was present face-to-face 100% of this time.  Complications   Complications documented before study signed (07/16/2017 2:15 PM EST)    RIGHT  HEART CATH AND CORONARY  ANGIOGRAPHY   None Documented by Burnell Blanks, MD 07/16/2017 1:43 PM EST  Time Range: Intra-procedure      Coronary Findings   Diagnostic  Dominance: Right  Left Anterior Descending  Vessel is large.  First Diagonal Branch  Vessel is small in size.  Second Diagonal Branch  Vessel is small in size.  Left Circumflex  First Obtuse Marginal Branch  Vessel is small in size.  Second Obtuse Marginal Branch  Vessel is large in size.  Third Obtuse Marginal Branch  Vessel is large in size.  Right Coronary Artery  Vessel is large.  Intervention   No interventions have been documented.  Coronary Diagrams   Diagnostic Diagram       Implants        No implant documentation for this case.  MERGE Images   Show images for CARDIAC CATHETERIZATION   Link to Procedure Log   Procedure Log    Hemo Data    Most Recent Value  Fick Cardiac Output 7.37 L/min  Fick Cardiac Output Index 2.51 (L/min)/BSA  RA A Wave 12 mmHg  RA V Wave 7 mmHg  RA Mean 6 mmHg  RV Systolic Pressure 36 mmHg  RV Diastolic Pressure 1 mmHg  RV EDP 12 mmHg  PA Systolic Pressure 34 mmHg  PA Diastolic Pressure 12 mmHg  PA Mean 22 mmHg  PW A Wave 15 mmHg  PW V Wave 13 mmHg  PW Mean 10 mmHg  AO Systolic Pressure 196 mmHg  AO Diastolic Pressure 78 mmHg  AO Mean 94 mmHg  QP/QS 1  TPVR Index 8.78 HRUI  TSVR Index 37.49 HRUI  PVR SVR Ratio 0.14  TPVR/TSVR Ratio 0.23      Cardiac TAVR CT  TECHNIQUE: The patient was scanned on a [employer]. A 120 kV retrospective scan was triggered in the descending thoracic aorta at 111 HU's. Gantry rotation speed was 250 msecs and collimation was .6 mm. No beta blockade or nitro were given. The 3D data set was reconstructed in 5% intervals of the R-R cycle. Systolic and diastolic phases were analyzed on a dedicated work station using MPR, MIP and VRT modes. The patient received 80 cc of contrast.  FINDINGS: Aortic  Valve: Bicuspid aortic valve with mild calcifications and moderate thickening. No calcifications are extending into the LVOT.  Aorta: There is severely dilated ascending aortic aneurysm with maximum diameter 56 mm. Minimal atherosclerosis, no dissection.  Sinotubular Junction: 38 x 37 mm  Ascending Thoracic Aorta: 56 x 56 mm  Aortic Arch: 33 x 30 mm  Descending Thoracic Aorta: 27 x 27 mm  Sinus of Valsalva Measurements: 38 x 32 mm  Coronary Artery Height above Annulus:  Left Main: 20 mm  Right Coronary: 14 mm  Virtual Basal Annulus Measurements:  Maximum/Minimum Diameter: 30.3 x 24.7 mm  Mean Diameter: 27.7 mm  Perimeter: 87.6 mm  Area: 563 mm2  Optimum Fluoroscopic Angle for Delivery: RAO 0 CRA 0  IMPRESSION: 1. Bicuspid aortic valve with mild calcifications and moderate thickening. No calcifications are extending into the LVOT. Annular measurements very challenging secondary to patients weight, however appear to be suitable for delivery of a 29 mm Edwards-SAPIEN 3 valve.  2. There is severely dilated ascending aortic aneurysm with maximum diameter 56 mm. Minimal atherosclerosis, no dissection.  3. Sufficient coronary to annulus distance.  4. Optimum Fluoroscopic Angle for Delivery: RAO 0 CRA 0  5. No thrombus in the left atrial appendage.  Electronically Signed By: Ena Dawley On: 07/30/2017 11:18   Pulmonary Function Tests  Baseline                                                                      Post-bronchodilator  FVC                 4.09 L  (76% predicted)          FVC                 3.85 L  (72% predicted) FEV1               3.17 L  (75% predicted)          FEV1               3.17 L  (75% predicted) FEF25-75        2.86 L  (76% predicted)          FEF25-75        2.92 L  (77% predicted)  TLC                 6.34 L  (88% predicted) RV                   2.23 L  (110% predicted) DLCO              75%  predicted   STS Risk Calculator  Procedure: Isolated AVR CALCULATE  Risk of Mortality:  1.867%   Renal Failure:  1.448%   Permanent Stroke:  0.168%   Prolonged Ventilation:  10.388%   DSW Infection:  0.381%   Reoperation:  1.964%   Morbidity or Mortality:  13.581%   Short Length of Stay:  39.272%   Long Length of Stay:  4.942%       Impression:  Patient has a bicuspid aortic valve with stage D severe symptomatic aortic stenosis and 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  I have personally reviewed the patient's recent transthoracic and transesophageal echocardiograms, diagnostic cardiac catheterization, and CT angiograms.  Echocardiograms confirmed the presence of a bicuspid aortic valve with severe aortic stenosis.  CT angiogram confirmed the presence of aneurysmal enlargement of the ascending thoracic aorta.  Risks associated with conventional surgery will be high because of the patient's extreme morbid obesity and history of chronic respiratory failure.  However, pulmonary function testing revealed only mild obstructive disease.  Because of the patient's relatively young age and presence of aneurysmal enlargement of the ascending thoracic aorta, I would not recommend transcatheter aortic valve replacement as an alternative to conventional surgery.   Plan:  The patient was counseled at length regarding treatment alternatives for management of severe aortic stenosis including continued medical therapy versus proceeding with aortic valve replacement and repair of ascending thoracic aortic aneurysm in the near future.  The natural history of aortic stenosis was reviewed, as was long term prognosis with medical therapy alone.  Surgical options were discussed at length including conventional surgical aortic valve replacement, rapid-deployment bioprosthetic tissue valve replacement, transcatheter aortic valve replacement, and alternatives for  management of the ascending aorta including supra coronary straight graft replacement versus Bentall aortic root replacement.   Discussion was  held comparing the relative risks of mechanical valve replacement with need for lifelong anticoagulation versus use of a bioprosthetic tissue valve and the associated potential for late structural valve deterioration and failure.  This discussion was placed in the context of the patient's particular circumstances, and as a result the patient specifically requests that their valve be replaced using a bioprosthetic tissue valve.  The patient understands that because of his relatively young age there is a considerable likelihood that his valve might deteriorate and need further surgical intervention during his lifetime.  The patient understands and accepts all potential associated risks of surgery including but not limited to risk of death, stroke, myocardial infarction, congestive heart failure, respiratory failure, renal failure, pneumonia, bleeding requiring blood transfusion and or reexploration, arrhythmia, heart block or bradycardia requiring permanent pacemaker, aortic dissection or other major vascular complication, pleural effusions or other delayed complications related to continued congestive heart failure, and other late complications related to valve replacement including structural valve deterioration and failure, thrombosis, endocarditis, or paravalvular leak.  We tentatively plan to proceed with surgery on August 23, 2017.  The patient has been instructed to stop smoking immediately.  Expectations for his postoperative convalescence have been discussed.  All of his questions have been addressed.     Valentina Gu. Roxy Manns, MD 07/30/2017 1:33 PM

## 2017-08-22 MED ORDER — SODIUM CHLORIDE 0.9 % IV SOLN
750.0000 mg | INTRAVENOUS | Status: DC
  Filled 2017-08-22: qty 750

## 2017-08-22 MED ORDER — SODIUM CHLORIDE 0.9 % IV SOLN
1500.0000 mg | INTRAVENOUS | Status: AC
  Administered 2017-08-23: 1500 mg via INTRAVENOUS
  Filled 2017-08-22: qty 1500

## 2017-08-22 MED ORDER — SODIUM CHLORIDE 0.9 % IV SOLN
INTRAVENOUS | Status: DC
  Filled 2017-08-22: qty 30

## 2017-08-22 MED ORDER — SODIUM CHLORIDE 0.9 % IV SOLN
INTRAVENOUS | Status: AC
  Administered 2017-08-23: 1.3 [IU]/h via INTRAVENOUS
  Filled 2017-08-22: qty 1

## 2017-08-22 MED ORDER — KENNESTONE BLOOD CARDIOPLEGIA VIAL
13.0000 mL | Freq: Once | Status: DC
  Filled 2017-08-22: qty 13

## 2017-08-22 MED ORDER — TRANEXAMIC ACID (OHS) PUMP PRIME SOLUTION
2.0000 mg/kg | INTRAVENOUS | Status: DC
  Filled 2017-08-22: qty 3.79

## 2017-08-22 MED ORDER — KENNESTONE BLOOD CARDIOPLEGIA (KBC) MANNITOL SYRINGE (20%, 32ML)
32.0000 mL | Freq: Once | INTRAVENOUS | Status: DC
  Filled 2017-08-22: qty 32

## 2017-08-22 MED ORDER — TRANEXAMIC ACID (OHS) BOLUS VIA INFUSION
15.0000 mg/kg | INTRAVENOUS | Status: AC
  Administered 2017-08-23: 2839.5 mg via INTRAVENOUS
  Filled 2017-08-22: qty 2840

## 2017-08-22 MED ORDER — TRANEXAMIC ACID 1000 MG/10ML IV SOLN
1.5000 mg/kg/h | INTRAVENOUS | Status: DC
  Filled 2017-08-22: qty 25

## 2017-08-22 MED ORDER — DOPAMINE-DEXTROSE 3.2-5 MG/ML-% IV SOLN
0.0000 ug/kg/min | INTRAVENOUS | Status: DC
  Filled 2017-08-22: qty 250

## 2017-08-22 MED ORDER — MILRINONE LACTATE IN DEXTROSE 20-5 MG/100ML-% IV SOLN
0.1250 ug/kg/min | INTRAVENOUS | Status: DC
  Filled 2017-08-22: qty 100

## 2017-08-22 MED ORDER — CEFUROXIME SODIUM 1.5 G IV SOLR
1.5000 g | INTRAVENOUS | Status: AC
  Administered 2017-08-23: 1.5 g via INTRAVENOUS
  Administered 2017-08-23: .75 g via INTRAVENOUS
  Filled 2017-08-22: qty 1.5

## 2017-08-22 MED ORDER — TRANEXAMIC ACID 1000 MG/10ML IV SOLN
1.5000 mg/kg/h | INTRAVENOUS | Status: AC
  Administered 2017-08-23: 1.5 mg/kg/h via INTRAVENOUS
  Filled 2017-08-22: qty 25

## 2017-08-22 MED ORDER — PAPAVERINE HCL 30 MG/ML IJ SOLN
INTRAMUSCULAR | Status: DC
  Filled 2017-08-22: qty 2.5

## 2017-08-22 MED ORDER — POTASSIUM CHLORIDE 2 MEQ/ML IV SOLN
80.0000 meq | INTRAVENOUS | Status: DC
  Filled 2017-08-22: qty 40

## 2017-08-22 MED ORDER — EPINEPHRINE PF 1 MG/ML IJ SOLN
0.0000 ug/min | INTRAVENOUS | Status: DC
  Filled 2017-08-22: qty 4

## 2017-08-22 MED ORDER — NITROGLYCERIN IN D5W 200-5 MCG/ML-% IV SOLN
2.0000 ug/min | INTRAVENOUS | Status: DC
  Filled 2017-08-22: qty 250

## 2017-08-22 MED ORDER — SODIUM CHLORIDE 0.9 % IV SOLN
INTRAVENOUS | Status: AC
  Administered 2017-08-23: 1000 mL
  Filled 2017-08-22: qty 1000

## 2017-08-22 MED ORDER — DEXMEDETOMIDINE HCL IN NACL 400 MCG/100ML IV SOLN
0.1000 ug/kg/h | INTRAVENOUS | Status: AC
  Administered 2017-08-23: .3 ug/kg/h via INTRAVENOUS
  Filled 2017-08-22: qty 100

## 2017-08-22 MED ORDER — MAGNESIUM SULFATE 50 % IJ SOLN
40.0000 meq | INTRAMUSCULAR | Status: DC
  Filled 2017-08-22: qty 9.85

## 2017-08-22 MED ORDER — SODIUM CHLORIDE 0.9 % IV SOLN
30.0000 ug/min | INTRAVENOUS | Status: AC
  Administered 2017-08-23: 40 ug/min via INTRAVENOUS
  Filled 2017-08-22: qty 2

## 2017-08-23 ENCOUNTER — Inpatient Hospital Stay (HOSPITAL_COMMUNITY): Payer: BLUE CROSS/BLUE SHIELD | Admitting: Anesthesiology

## 2017-08-23 ENCOUNTER — Encounter (HOSPITAL_COMMUNITY): Payer: Self-pay | Admitting: *Deleted

## 2017-08-23 ENCOUNTER — Inpatient Hospital Stay (HOSPITAL_COMMUNITY): Payer: BLUE CROSS/BLUE SHIELD

## 2017-08-23 ENCOUNTER — Encounter (HOSPITAL_COMMUNITY)
Admission: RE | Disposition: A | Discharge status: Home / Self Care | Payer: Self-pay | Source: Ambulatory Visit | Attending: Thoracic Surgery (Cardiothoracic Vascular Surgery)

## 2017-08-23 ENCOUNTER — Inpatient Hospital Stay (HOSPITAL_COMMUNITY)
Admission: RE | Admit: 2017-08-23 | Discharge: 2017-09-01 | DRG: 219 | Disposition: A | Discharge status: Home / Self Care | Payer: BLUE CROSS/BLUE SHIELD | Source: Ambulatory Visit | Attending: Thoracic Surgery (Cardiothoracic Vascular Surgery) | Admitting: Thoracic Surgery (Cardiothoracic Vascular Surgery)

## 2017-08-23 ENCOUNTER — Other Ambulatory Visit: Payer: Self-pay

## 2017-08-23 DIAGNOSIS — D62 Acute posthemorrhagic anemia: Secondary | ICD-10-CM | POA: Diagnosis not present

## 2017-08-23 DIAGNOSIS — I10 Essential (primary) hypertension: Secondary | ICD-10-CM | POA: Diagnosis not present

## 2017-08-23 DIAGNOSIS — I5033 Acute on chronic diastolic (congestive) heart failure: Secondary | ICD-10-CM | POA: Diagnosis not present

## 2017-08-23 DIAGNOSIS — Q231 Congenital insufficiency of aortic valve: Secondary | ICD-10-CM | POA: Diagnosis not present

## 2017-08-23 DIAGNOSIS — Z6841 Body Mass Index (BMI) 40.0 and over, adult: Secondary | ICD-10-CM | POA: Diagnosis not present

## 2017-08-23 DIAGNOSIS — I872 Venous insufficiency (chronic) (peripheral): Secondary | ICD-10-CM | POA: Diagnosis present

## 2017-08-23 DIAGNOSIS — Z79899 Other long term (current) drug therapy: Secondary | ICD-10-CM

## 2017-08-23 DIAGNOSIS — I110 Hypertensive heart disease with heart failure: Secondary | ICD-10-CM | POA: Diagnosis present

## 2017-08-23 DIAGNOSIS — I442 Atrioventricular block, complete: Secondary | ICD-10-CM | POA: Diagnosis not present

## 2017-08-23 DIAGNOSIS — I493 Ventricular premature depolarization: Secondary | ICD-10-CM | POA: Diagnosis not present

## 2017-08-23 DIAGNOSIS — I469 Cardiac arrest, cause unspecified: Secondary | ICD-10-CM | POA: Diagnosis not present

## 2017-08-23 DIAGNOSIS — F17210 Nicotine dependence, cigarettes, uncomplicated: Secondary | ICD-10-CM | POA: Diagnosis present

## 2017-08-23 DIAGNOSIS — Z7982 Long term (current) use of aspirin: Secondary | ICD-10-CM

## 2017-08-23 DIAGNOSIS — I712 Thoracic aortic aneurysm, without rupture: Secondary | ICD-10-CM

## 2017-08-23 DIAGNOSIS — Z8249 Family history of ischemic heart disease and other diseases of the circulatory system: Secondary | ICD-10-CM

## 2017-08-23 DIAGNOSIS — Z953 Presence of xenogenic heart valve: Secondary | ICD-10-CM

## 2017-08-23 DIAGNOSIS — I350 Nonrheumatic aortic (valve) stenosis: Secondary | ICD-10-CM | POA: Diagnosis present

## 2017-08-23 DIAGNOSIS — J9811 Atelectasis: Secondary | ICD-10-CM

## 2017-08-23 DIAGNOSIS — I272 Pulmonary hypertension, unspecified: Secondary | ICD-10-CM | POA: Diagnosis present

## 2017-08-23 DIAGNOSIS — G4733 Obstructive sleep apnea (adult) (pediatric): Secondary | ICD-10-CM | POA: Diagnosis present

## 2017-08-23 DIAGNOSIS — E876 Hypokalemia: Secondary | ICD-10-CM | POA: Diagnosis not present

## 2017-08-23 DIAGNOSIS — I481 Persistent atrial fibrillation: Secondary | ICD-10-CM | POA: Diagnosis not present

## 2017-08-23 DIAGNOSIS — J449 Chronic obstructive pulmonary disease, unspecified: Secondary | ICD-10-CM | POA: Diagnosis present

## 2017-08-23 DIAGNOSIS — K5900 Constipation, unspecified: Secondary | ICD-10-CM | POA: Diagnosis not present

## 2017-08-23 DIAGNOSIS — Z952 Presence of prosthetic heart valve: Secondary | ICD-10-CM

## 2017-08-23 DIAGNOSIS — I472 Ventricular tachycardia: Secondary | ICD-10-CM | POA: Diagnosis not present

## 2017-08-23 DIAGNOSIS — I480 Paroxysmal atrial fibrillation: Secondary | ICD-10-CM | POA: Diagnosis not present

## 2017-08-23 DIAGNOSIS — I714 Abdominal aortic aneurysm, without rupture: Secondary | ICD-10-CM | POA: Diagnosis present

## 2017-08-23 DIAGNOSIS — F419 Anxiety disorder, unspecified: Secondary | ICD-10-CM | POA: Diagnosis present

## 2017-08-23 DIAGNOSIS — I4510 Unspecified right bundle-branch block: Secondary | ICD-10-CM | POA: Diagnosis not present

## 2017-08-23 DIAGNOSIS — I4891 Unspecified atrial fibrillation: Secondary | ICD-10-CM | POA: Diagnosis not present

## 2017-08-23 DIAGNOSIS — Z95828 Presence of other vascular implants and grafts: Secondary | ICD-10-CM

## 2017-08-23 DIAGNOSIS — I5032 Chronic diastolic (congestive) heart failure: Secondary | ICD-10-CM | POA: Diagnosis present

## 2017-08-23 DIAGNOSIS — I7121 Aneurysm of the ascending aorta, without rupture: Secondary | ICD-10-CM | POA: Diagnosis present

## 2017-08-23 DIAGNOSIS — Z959 Presence of cardiac and vascular implant and graft, unspecified: Secondary | ICD-10-CM

## 2017-08-23 HISTORY — DX: Presence of other vascular implants and grafts: Z95.828

## 2017-08-23 HISTORY — PX: TEE WITHOUT CARDIOVERSION: SHX5443

## 2017-08-23 HISTORY — DX: Presence of xenogenic heart valve: Z95.3

## 2017-08-23 HISTORY — DX: Pulmonary hypertension, unspecified: I27.20

## 2017-08-23 HISTORY — PX: AORTIC VALVE REPLACEMENT: SHX41

## 2017-08-23 HISTORY — PX: THORACIC AORTIC ANEURYSM REPAIR: SHX799

## 2017-08-23 LAB — POCT I-STAT 3, ART BLOOD GAS (G3+)
Acid-Base Excess: 4 mmol/L — ABNORMAL HIGH (ref 0.0–2.0)
Acid-Base Excess: 2 mmol/L (ref 0.0–2.0)
Acid-base deficit: 1 mmol/L (ref 0.0–2.0)
Acid-base deficit: 2 mmol/L (ref 0.0–2.0)
BICARBONATE: 27.3 mmol/L (ref 20.0–28.0)
BICARBONATE: 25.9 mmol/L (ref 20.0–28.0)
BICARBONATE: 25.6 mmol/L (ref 20.0–28.0)
BICARBONATE: 23.1 mmol/L (ref 20.0–28.0)
Bicarbonate: 30.5 mmol/L — ABNORMAL HIGH (ref 20.0–28.0)
O2 SAT: 86 %
O2 SAT: 98 %
O2 Saturation: 100 %
O2 Saturation: 100 %
O2 Saturation: 93 %
PCO2 ART: 50 mmHg — AB (ref 32.0–48.0)
PCO2 ART: 39.5 mmHg (ref 32.0–48.0)
PH ART: 7.343 — AB (ref 7.350–7.450)
PH ART: 7.413 (ref 7.350–7.450)
PO2 ART: 415 mmHg — AB (ref 83.0–108.0)
PO2 ART: 103 mmHg (ref 83.0–108.0)
PO2 ART: 68 mmHg — AB (ref 83.0–108.0)
Patient temperature: 35.7
Patient temperature: 37
TCO2: 32 mmol/L (ref 22–32)
TCO2: 29 mmol/L (ref 22–32)
TCO2: 28 mmol/L (ref 22–32)
TCO2: 27 mmol/L (ref 22–32)
TCO2: 24 mmol/L (ref 22–32)
pCO2 arterial: 56.2 mmHg — ABNORMAL HIGH (ref 32.0–48.0)
pCO2 arterial: 42.7 mmHg (ref 32.0–48.0)
pCO2 arterial: 43.8 mmHg (ref 32.0–48.0)
pH, Arterial: 7.317 — ABNORMAL LOW (ref 7.350–7.450)
pH, Arterial: 7.375 (ref 7.350–7.450)
pH, Arterial: 7.375 (ref 7.350–7.450)
pO2, Arterial: 383 mmHg — ABNORMAL HIGH (ref 83.0–108.0)
pO2, Arterial: 53 mmHg — ABNORMAL LOW (ref 83.0–108.0)

## 2017-08-23 LAB — CBC WITH DIFFERENTIAL/PLATELET
BASOS PCT: 0 %
Basophils Absolute: 0 10*3/uL (ref 0.0–0.1)
EOS ABS: 0 10*3/uL (ref 0.0–0.7)
Eosinophils Relative: 0 %
HCT: 42.8 % (ref 39.0–52.0)
HEMOGLOBIN: 14 g/dL (ref 13.0–17.0)
Lymphocytes Relative: 5 %
Lymphs Abs: 0.9 10*3/uL (ref 0.7–4.0)
MCH: 27 pg (ref 26.0–34.0)
MCHC: 32.7 g/dL (ref 30.0–36.0)
MCV: 82.5 fL (ref 78.0–100.0)
MONOS PCT: 3 %
Monocytes Absolute: 0.5 10*3/uL (ref 0.1–1.0)
NEUTROS PCT: 92 %
Neutro Abs: 17.4 10*3/uL — ABNORMAL HIGH (ref 1.7–7.7)
PLATELETS: 186 10*3/uL (ref 150–400)
RBC: 5.19 MIL/uL (ref 4.22–5.81)
RDW: 15.7 % — ABNORMAL HIGH (ref 11.5–15.5)
WBC: 18.7 10*3/uL — ABNORMAL HIGH (ref 4.0–10.5)

## 2017-08-23 LAB — POCT I-STAT, CHEM 8
BUN: 21 mg/dL — ABNORMAL HIGH (ref 6–20)
BUN: 18 mg/dL (ref 6–20)
BUN: 20 mg/dL (ref 6–20)
BUN: 19 mg/dL (ref 6–20)
BUN: 18 mg/dL (ref 6–20)
BUN: 22 mg/dL — AB (ref 6–20)
CALCIUM ION: 1.2 mmol/L (ref 1.15–1.40)
CHLORIDE: 98 mmol/L — AB (ref 101–111)
CHLORIDE: 97 mmol/L — AB (ref 101–111)
CHLORIDE: 100 mmol/L — AB (ref 101–111)
CREATININE: 0.7 mg/dL (ref 0.61–1.24)
CREATININE: 0.7 mg/dL (ref 0.61–1.24)
CREATININE: 0.7 mg/dL (ref 0.61–1.24)
CREATININE: 0.8 mg/dL (ref 0.61–1.24)
Calcium, Ion: 1.2 mmol/L (ref 1.15–1.40)
Calcium, Ion: 1.09 mmol/L — ABNORMAL LOW (ref 1.15–1.40)
Calcium, Ion: 1.18 mmol/L (ref 1.15–1.40)
Calcium, Ion: 1.07 mmol/L — ABNORMAL LOW (ref 1.15–1.40)
Calcium, Ion: 1.16 mmol/L (ref 1.15–1.40)
Chloride: 96 mmol/L — ABNORMAL LOW (ref 101–111)
Chloride: 97 mmol/L — ABNORMAL LOW (ref 101–111)
Chloride: 104 mmol/L (ref 101–111)
Creatinine, Ser: 0.8 mg/dL (ref 0.61–1.24)
Creatinine, Ser: 0.8 mg/dL (ref 0.61–1.24)
GLUCOSE: 137 mg/dL — AB (ref 65–99)
GLUCOSE: 206 mg/dL — AB (ref 65–99)
Glucose, Bld: 126 mg/dL — ABNORMAL HIGH (ref 65–99)
Glucose, Bld: 140 mg/dL — ABNORMAL HIGH (ref 65–99)
Glucose, Bld: 256 mg/dL — ABNORMAL HIGH (ref 65–99)
Glucose, Bld: 133 mg/dL — ABNORMAL HIGH (ref 65–99)
HCT: 37 % — ABNORMAL LOW (ref 39.0–52.0)
HCT: 35 % — ABNORMAL LOW (ref 39.0–52.0)
HEMATOCRIT: 40 % (ref 39.0–52.0)
HEMATOCRIT: 34 % — AB (ref 39.0–52.0)
HEMATOCRIT: 28 % — AB (ref 39.0–52.0)
HEMATOCRIT: 41 % (ref 39.0–52.0)
HEMOGLOBIN: 13.6 g/dL (ref 13.0–17.0)
HEMOGLOBIN: 12.6 g/dL — AB (ref 13.0–17.0)
HEMOGLOBIN: 9.5 g/dL — AB (ref 13.0–17.0)
Hemoglobin: 11.6 g/dL — ABNORMAL LOW (ref 13.0–17.0)
Hemoglobin: 11.9 g/dL — ABNORMAL LOW (ref 13.0–17.0)
Hemoglobin: 13.9 g/dL (ref 13.0–17.0)
POTASSIUM: 3 mmol/L — AB (ref 3.5–5.1)
POTASSIUM: 3.3 mmol/L — AB (ref 3.5–5.1)
POTASSIUM: 3.3 mmol/L — AB (ref 3.5–5.1)
POTASSIUM: 3.4 mmol/L — AB (ref 3.5–5.1)
POTASSIUM: 3.1 mmol/L — AB (ref 3.5–5.1)
POTASSIUM: 4.6 mmol/L (ref 3.5–5.1)
SODIUM: 139 mmol/L (ref 135–145)
Sodium: 138 mmol/L (ref 135–145)
Sodium: 140 mmol/L (ref 135–145)
Sodium: 136 mmol/L (ref 135–145)
Sodium: 139 mmol/L (ref 135–145)
Sodium: 139 mmol/L (ref 135–145)
TCO2: 29 mmol/L (ref 22–32)
TCO2: 30 mmol/L (ref 22–32)
TCO2: 30 mmol/L (ref 22–32)
TCO2: 28 mmol/L (ref 22–32)
TCO2: 26 mmol/L (ref 22–32)
TCO2: 24 mmol/L (ref 22–32)

## 2017-08-23 LAB — CBC
HCT: 43.9 % (ref 39.0–52.0)
HEMOGLOBIN: 14.4 g/dL (ref 13.0–17.0)
MCH: 27.2 pg (ref 26.0–34.0)
MCHC: 32.8 g/dL (ref 30.0–36.0)
MCV: 83 fL (ref 78.0–100.0)
Platelets: 201 10*3/uL (ref 150–400)
RBC: 5.29 MIL/uL (ref 4.22–5.81)
RDW: 15.6 % — ABNORMAL HIGH (ref 11.5–15.5)
WBC: 27 10*3/uL — AB (ref 4.0–10.5)

## 2017-08-23 LAB — GLUCOSE, CAPILLARY
GLUCOSE-CAPILLARY: 87 mg/dL (ref 65–99)
GLUCOSE-CAPILLARY: 106 mg/dL — AB (ref 65–99)
Glucose-Capillary: 88 mg/dL (ref 65–99)
Glucose-Capillary: 108 mg/dL — ABNORMAL HIGH (ref 65–99)
Glucose-Capillary: 120 mg/dL — ABNORMAL HIGH (ref 65–99)
Glucose-Capillary: 116 mg/dL — ABNORMAL HIGH (ref 65–99)

## 2017-08-23 LAB — PROTIME-INR
INR: 1.23
PROTHROMBIN TIME: 15.4 s — AB (ref 11.4–15.2)

## 2017-08-23 LAB — MAGNESIUM: MAGNESIUM: 2.7 mg/dL — AB (ref 1.7–2.4)

## 2017-08-23 LAB — CREATININE, SERUM
CREATININE: 0.97 mg/dL (ref 0.61–1.24)
GFR calc Af Amer: >60 mL/min (ref >60)
GFR calc non Af Amer: >60 mL/min (ref >60)

## 2017-08-23 LAB — HEMOGLOBIN AND HEMATOCRIT, BLOOD
HCT: 30.5 % — ABNORMAL LOW (ref 39.0–52.0)
HEMOGLOBIN: 10.1 g/dL — AB (ref 13.0–17.0)

## 2017-08-23 LAB — APTT: aPTT: 38 seconds — ABNORMAL HIGH (ref 24–36)

## 2017-08-23 LAB — PLATELET COUNT: Platelets: 240 10*3/uL (ref 150–400)

## 2017-08-23 SURGERY — REPLACEMENT, AORTIC VALVE, OPEN
Anesthesia: General | Site: Chest

## 2017-08-23 MED ORDER — CEFAZOLIN SODIUM-DEXTROSE 2-4 GM/100ML-% IV SOLN
2.0000 g | Freq: Three times a day (TID) | INTRAVENOUS | Status: AC
  Administered 2017-08-24 – 2017-08-25 (×6): 2 g via INTRAVENOUS
  Filled 2017-08-23 (×6): qty 100

## 2017-08-23 MED ORDER — ROCURONIUM BROMIDE 10 MG/ML (PF) SYRINGE
PREFILLED_SYRINGE | INTRAVENOUS | Status: AC
  Filled 2017-08-23: qty 10

## 2017-08-23 MED ORDER — DEXMEDETOMIDINE HCL IN NACL 200 MCG/50ML IV SOLN
0.0000 ug/kg/h | INTRAVENOUS | Status: DC
  Administered 2017-08-23: 0.5 ug/kg/h via INTRAVENOUS
  Filled 2017-08-23 (×2): qty 50

## 2017-08-23 MED ORDER — DOCUSATE SODIUM 100 MG PO CAPS
200.0000 mg | ORAL_CAPSULE | Freq: Every day | ORAL | Status: DC
  Administered 2017-08-24 – 2017-09-01 (×9): 200 mg via ORAL
  Filled 2017-08-23 (×8): qty 2

## 2017-08-23 MED ORDER — ACETAMINOPHEN 160 MG/5ML PO SOLN: 650.0000 mg | Freq: Once | ORAL | Status: AC

## 2017-08-23 MED ORDER — METOPROLOL TARTRATE 12.5 MG HALF TABLET
12.5000 mg | ORAL_TABLET | Freq: Two times a day (BID) | ORAL | Status: DC
  Administered 2017-08-24 (×2): 12.5 mg via ORAL
  Filled 2017-08-23 (×3): qty 1

## 2017-08-23 MED ORDER — FAMOTIDINE IN NACL 20-0.9 MG/50ML-% IV SOLN
20.0000 mg | Freq: Two times a day (BID) | INTRAVENOUS | Status: AC
  Administered 2017-08-23 (×2): 20 mg via INTRAVENOUS
  Filled 2017-08-23: qty 50

## 2017-08-23 MED ORDER — SODIUM CHLORIDE 0.9 % IV SOLN: INTRAVENOUS | Status: DC

## 2017-08-23 MED ORDER — ASPIRIN 81 MG PO CHEW: 324.0000 mg | CHEWABLE_TABLET | Freq: Every day | ORAL | Status: DC

## 2017-08-23 MED ORDER — INSULIN ASPART 100 UNIT/ML ~~LOC~~ SOLN
0.0000 [IU] | SUBCUTANEOUS | Status: DC
  Administered 2017-08-25: 2 [IU] via SUBCUTANEOUS

## 2017-08-23 MED ORDER — MIDAZOLAM HCL 5 MG/5ML IJ SOLN
INTRAMUSCULAR | Status: DC | PRN
  Administered 2017-08-23: 4 mg via INTRAVENOUS
  Administered 2017-08-23: 1 mg via INTRAVENOUS
  Administered 2017-08-23: 3 mg via INTRAVENOUS
  Administered 2017-08-23: 2 mg via INTRAVENOUS

## 2017-08-23 MED ORDER — OXYCODONE HCL 5 MG PO TABS
5.0000 mg | ORAL_TABLET | ORAL | Status: DC | PRN
  Administered 2017-08-24 – 2017-09-01 (×17): 10 mg via ORAL
  Filled 2017-08-23 (×17): qty 2

## 2017-08-23 MED ORDER — PROPOFOL 10 MG/ML IV BOLUS
INTRAVENOUS | Status: AC
  Filled 2017-08-23: qty 20

## 2017-08-23 MED ORDER — PHENYLEPHRINE 40 MCG/ML (10ML) SYRINGE FOR IV PUSH (FOR BLOOD PRESSURE SUPPORT)
PREFILLED_SYRINGE | INTRAVENOUS | Status: AC
  Filled 2017-08-23: qty 10

## 2017-08-23 MED ORDER — LACTATED RINGERS IV SOLN: INTRAVENOUS | Status: DC

## 2017-08-23 MED ORDER — METOPROLOL TARTRATE 5 MG/5ML IV SOLN: 2.5000 mg | INTRAVENOUS | Status: DC | PRN

## 2017-08-23 MED ORDER — SODIUM CHLORIDE 0.9% FLUSH
3.0000 mL | Freq: Two times a day (BID) | INTRAVENOUS | Status: DC
  Administered 2017-08-24 – 2017-08-27 (×4): 3 mL via INTRAVENOUS

## 2017-08-23 MED ORDER — FENTANYL CITRATE (PF) 250 MCG/5ML IJ SOLN
INTRAMUSCULAR | Status: AC
  Filled 2017-08-23: qty 25

## 2017-08-23 MED ORDER — ALLOPURINOL 300 MG PO TABS
300.0000 mg | ORAL_TABLET | Freq: Every day | ORAL | Status: DC
  Administered 2017-08-24 – 2017-09-01 (×9): 300 mg via ORAL
  Filled 2017-08-23 (×9): qty 1

## 2017-08-23 MED ORDER — POTASSIUM CHLORIDE 10 MEQ/50ML IV SOLN
10.0000 meq | INTRAVENOUS | Status: AC
  Administered 2017-08-23 (×3): 10 meq via INTRAVENOUS

## 2017-08-23 MED ORDER — ACETAMINOPHEN 650 MG RE SUPP
650.0000 mg | Freq: Once | RECTAL | Status: AC
  Administered 2017-08-23: 650 mg via RECTAL

## 2017-08-23 MED ORDER — MAGNESIUM SULFATE 4 GM/100ML IV SOLN
4.0000 g | Freq: Once | INTRAVENOUS | Status: AC
  Administered 2017-08-23: 4 g via INTRAVENOUS
  Filled 2017-08-23: qty 100

## 2017-08-23 MED ORDER — PROPOFOL 10 MG/ML IV BOLUS
INTRAVENOUS | Status: DC | PRN
  Administered 2017-08-23: 50 mg via INTRAVENOUS
  Administered 2017-08-23: 150 mg via INTRAVENOUS

## 2017-08-23 MED ORDER — ROCURONIUM BROMIDE 10 MG/ML (PF) SYRINGE
PREFILLED_SYRINGE | INTRAVENOUS | Status: AC
  Filled 2017-08-23: qty 5

## 2017-08-23 MED ORDER — MIDAZOLAM HCL 2 MG/2ML IJ SOLN
2.0000 mg | INTRAMUSCULAR | Status: DC | PRN
  Administered 2017-08-23: 2 mg via INTRAVENOUS
  Filled 2017-08-23: qty 2

## 2017-08-23 MED ORDER — 0.9 % SODIUM CHLORIDE (POUR BTL) OPTIME
TOPICAL | Status: DC | PRN
Start: 2017-08-23 — End: 2017-08-23
  Administered 2017-08-23: 5000 mL

## 2017-08-23 MED ORDER — ONDANSETRON HCL 4 MG/2ML IJ SOLN: 4.0000 mg | Freq: Four times a day (QID) | INTRAMUSCULAR | Status: DC | PRN

## 2017-08-23 MED ORDER — SODIUM CHLORIDE 0.9 % IV SOLN
250.0000 mL | INTRAVENOUS | Status: DC
  Administered 2017-08-24: 250 mL via INTRAVENOUS

## 2017-08-23 MED ORDER — LACTATED RINGERS IV SOLN
INTRAVENOUS | Status: DC | PRN
  Administered 2017-08-23: 07:00:00 via INTRAVENOUS

## 2017-08-23 MED ORDER — ONDANSETRON HCL 4 MG/2ML IJ SOLN
INTRAMUSCULAR | Status: AC
  Filled 2017-08-23: qty 2

## 2017-08-23 MED ORDER — BISACODYL 10 MG RE SUPP
10.0000 mg | Freq: Every day | RECTAL | Status: DC
  Filled 2017-08-23: qty 1

## 2017-08-23 MED ORDER — ROCURONIUM BROMIDE 10 MG/ML (PF) SYRINGE
PREFILLED_SYRINGE | INTRAVENOUS | Status: DC | PRN
  Administered 2017-08-23: 50 mg via INTRAVENOUS
  Administered 2017-08-23: 30 mg via INTRAVENOUS
  Administered 2017-08-23 (×3): 50 mg via INTRAVENOUS

## 2017-08-23 MED ORDER — ASPIRIN EC 325 MG PO TBEC
325.0000 mg | DELAYED_RELEASE_TABLET | Freq: Every day | ORAL | Status: DC
  Administered 2017-08-24 – 2017-09-01 (×9): 325 mg via ORAL
  Filled 2017-08-23 (×9): qty 1

## 2017-08-23 MED ORDER — NITROGLYCERIN IN D5W 200-5 MCG/ML-% IV SOLN: 0.0000 ug/min | INTRAVENOUS | Status: DC

## 2017-08-23 MED ORDER — EPHEDRINE 5 MG/ML INJ
INTRAVENOUS | Status: AC
  Filled 2017-08-23: qty 20

## 2017-08-23 MED ORDER — MORPHINE SULFATE (PF) 2 MG/ML IV SOLN: 1.0000 mg | INTRAVENOUS | Status: DC | PRN

## 2017-08-23 MED ORDER — ONDANSETRON HCL 4 MG/2ML IJ SOLN
INTRAMUSCULAR | Status: DC | PRN
  Administered 2017-08-23: 4 mg via INTRAVENOUS

## 2017-08-23 MED ORDER — DEXAMETHASONE SODIUM PHOSPHATE 10 MG/ML IJ SOLN
INTRAMUSCULAR | Status: AC
  Filled 2017-08-23: qty 1

## 2017-08-23 MED ORDER — ALBUMIN HUMAN 5 % IV SOLN
INTRAVENOUS | Status: DC | PRN
  Administered 2017-08-23: 12:00:00 via INTRAVENOUS

## 2017-08-23 MED ORDER — ACETAMINOPHEN 500 MG PO TABS
1000.0000 mg | ORAL_TABLET | Freq: Four times a day (QID) | ORAL | Status: AC
  Administered 2017-08-23 – 2017-08-28 (×20): 1000 mg via ORAL
  Filled 2017-08-23 (×18): qty 2

## 2017-08-23 MED ORDER — ACETAMINOPHEN 160 MG/5ML PO SOLN: 1000.0000 mg | Freq: Four times a day (QID) | ORAL | Status: DC

## 2017-08-23 MED ORDER — CHLORHEXIDINE GLUCONATE 0.12 % MT SOLN
15.0000 mL | OROMUCOSAL | Status: AC
  Administered 2017-08-23: 15 mL via OROMUCOSAL

## 2017-08-23 MED ORDER — HEMOSTATIC AGENTS (NO CHARGE) OPTIME
TOPICAL | Status: DC | PRN
  Administered 2017-08-23 (×2): 1 via TOPICAL

## 2017-08-23 MED ORDER — CLONAZEPAM 0.5 MG PO TABS
0.5000 mg | ORAL_TABLET | Freq: Two times a day (BID) | ORAL | Status: DC | PRN
  Administered 2017-08-28 – 2017-08-31 (×6): 0.5 mg via ORAL
  Filled 2017-08-23 (×6): qty 1

## 2017-08-23 MED ORDER — MORPHINE SULFATE (PF) 2 MG/ML IV SOLN
1.0000 mg | INTRAVENOUS | Status: DC | PRN
  Administered 2017-08-23 – 2017-08-25 (×10): 2 mg via INTRAVENOUS
  Filled 2017-08-23 (×10): qty 1

## 2017-08-23 MED ORDER — METOPROLOL TARTRATE 25 MG/10 ML ORAL SUSPENSION: 12.5000 mg | Freq: Two times a day (BID) | ORAL | Status: DC

## 2017-08-23 MED ORDER — SODIUM CHLORIDE 0.9 % IJ SOLN
INTRAMUSCULAR | Status: AC
  Filled 2017-08-23: qty 20

## 2017-08-23 MED ORDER — DEXAMETHASONE SODIUM PHOSPHATE 10 MG/ML IJ SOLN
INTRAMUSCULAR | Status: DC | PRN
  Administered 2017-08-23: 10 mg via INTRAVENOUS

## 2017-08-23 MED ORDER — SODIUM CHLORIDE 0.9% FLUSH: 3.0000 mL | INTRAVENOUS | Status: DC | PRN

## 2017-08-23 MED ORDER — SODIUM CHLORIDE 0.9 % IV SOLN
INTRAVENOUS | Status: DC
  Administered 2017-08-23: 100 mL/h via INTRAVENOUS

## 2017-08-23 MED ORDER — HEPARIN SODIUM (PORCINE) 1000 UNIT/ML IJ SOLN
INTRAMUSCULAR | Status: AC
  Filled 2017-08-23: qty 1

## 2017-08-23 MED ORDER — PHENYLEPHRINE HCL 10 MG/ML IJ SOLN
INTRAMUSCULAR | Status: AC
  Filled 2017-08-23: qty 2

## 2017-08-23 MED ORDER — SODIUM CHLORIDE 0.9 % IJ SOLN
OROMUCOSAL | Status: DC | PRN
  Administered 2017-08-23 (×3): 4 mL via TOPICAL

## 2017-08-23 MED ORDER — CHLORHEXIDINE GLUCONATE 4 % EX LIQD: 30.0000 mL | CUTANEOUS | Status: DC

## 2017-08-23 MED ORDER — CITALOPRAM HYDROBROMIDE 20 MG PO TABS
20.0000 mg | ORAL_TABLET | Freq: Every day | ORAL | Status: DC
  Administered 2017-08-24 – 2017-08-31 (×8): 20 mg via ORAL
  Filled 2017-08-23 (×8): qty 1

## 2017-08-23 MED ORDER — LACTATED RINGERS IV SOLN: 500.0000 mL | Freq: Once | INTRAVENOUS | Status: DC | PRN

## 2017-08-23 MED ORDER — PANTOPRAZOLE SODIUM 40 MG PO TBEC
40.0000 mg | DELAYED_RELEASE_TABLET | Freq: Every day | ORAL | Status: DC
  Administered 2017-08-25 – 2017-09-01 (×8): 40 mg via ORAL
  Filled 2017-08-23 (×8): qty 1

## 2017-08-23 MED ORDER — INSULIN REGULAR BOLUS VIA INFUSION
0.0000 [IU] | Freq: Three times a day (TID) | INTRAVENOUS | Status: DC
  Filled 2017-08-23: qty 10

## 2017-08-23 MED ORDER — FENTANYL CITRATE (PF) 250 MCG/5ML IJ SOLN
INTRAMUSCULAR | Status: DC | PRN
  Administered 2017-08-23: 50 ug via INTRAVENOUS
  Administered 2017-08-23: 150 ug via INTRAVENOUS
  Administered 2017-08-23 (×2): 100 ug via INTRAVENOUS
  Administered 2017-08-23 (×5): 50 ug via INTRAVENOUS
  Administered 2017-08-23 (×2): 200 ug via INTRAVENOUS
  Administered 2017-08-23: 100 ug via INTRAVENOUS

## 2017-08-23 MED ORDER — EPHEDRINE SULFATE-NACL 50-0.9 MG/10ML-% IV SOSY
PREFILLED_SYRINGE | INTRAVENOUS | Status: DC | PRN
  Administered 2017-08-23: 5 mg via INTRAVENOUS

## 2017-08-23 MED ORDER — VANCOMYCIN HCL IN DEXTROSE 1-5 GM/200ML-% IV SOLN
1000.0000 mg | Freq: Once | INTRAVENOUS | Status: AC
  Administered 2017-08-23: 1000 mg via INTRAVENOUS
  Filled 2017-08-23: qty 200

## 2017-08-23 MED ORDER — SODIUM CHLORIDE 0.9 % IV SOLN
INTRAVENOUS | Status: DC | PRN
  Administered 2017-08-23: 12:00:00 via INTRAVENOUS

## 2017-08-23 MED ORDER — LACTATED RINGERS IV SOLN
INTRAVENOUS | Status: DC | PRN
  Administered 2017-08-23 (×2): via INTRAVENOUS

## 2017-08-23 MED ORDER — MIDAZOLAM HCL 10 MG/2ML IJ SOLN
INTRAMUSCULAR | Status: AC
  Filled 2017-08-23: qty 2

## 2017-08-23 MED ORDER — SODIUM CHLORIDE 0.9 % IV SOLN
INTRAVENOUS | Status: DC
  Administered 2017-08-23: 1.9 [IU]/h via INTRAVENOUS
  Filled 2017-08-23: qty 1

## 2017-08-23 MED ORDER — SUCCINYLCHOLINE CHLORIDE 200 MG/10ML IV SOSY
PREFILLED_SYRINGE | INTRAVENOUS | Status: DC | PRN
  Administered 2017-08-23: 140 mg via INTRAVENOUS

## 2017-08-23 MED ORDER — CHLORHEXIDINE GLUCONATE 0.12 % MT SOLN
15.0000 mL | Freq: Once | OROMUCOSAL | Status: AC
  Administered 2017-08-23: 15 mL via OROMUCOSAL
  Filled 2017-08-23: qty 15

## 2017-08-23 MED ORDER — METOPROLOL TARTRATE 12.5 MG HALF TABLET
12.5000 mg | ORAL_TABLET | Freq: Once | ORAL | Status: DC
  Filled 2017-08-23: qty 1

## 2017-08-23 MED ORDER — BISACODYL 5 MG PO TBEC
10.0000 mg | DELAYED_RELEASE_TABLET | Freq: Every day | ORAL | Status: DC
  Administered 2017-08-26 – 2017-08-31 (×5): 10 mg via ORAL
  Filled 2017-08-23 (×7): qty 2

## 2017-08-23 MED ORDER — TRAMADOL HCL 50 MG PO TABS
50.0000 mg | ORAL_TABLET | ORAL | Status: DC | PRN
  Administered 2017-08-24 – 2017-08-29 (×5): 100 mg via ORAL
  Filled 2017-08-23 (×5): qty 2

## 2017-08-23 MED ORDER — ALBUMIN HUMAN 5 % IV SOLN: 250.0000 mL | INTRAVENOUS | Status: AC | PRN

## 2017-08-23 MED ORDER — PROTAMINE SULFATE 10 MG/ML IV SOLN
INTRAVENOUS | Status: DC | PRN
  Administered 2017-08-23: 40 mg via INTRAVENOUS

## 2017-08-23 MED ORDER — SODIUM CHLORIDE 0.9 % IV SOLN
0.0000 ug/min | INTRAVENOUS | Status: DC
  Administered 2017-08-23: 0 ug/min via INTRAVENOUS
  Filled 2017-08-23: qty 20

## 2017-08-23 MED ORDER — SODIUM CHLORIDE 0.45 % IV SOLN
INTRAVENOUS | Status: DC | PRN
  Administered 2017-08-24: 02:00:00 via INTRAVENOUS

## 2017-08-23 MED ORDER — HEPARIN SODIUM (PORCINE) 1000 UNIT/ML IJ SOLN
INTRAMUSCULAR | Status: DC | PRN
  Administered 2017-08-23: 55000 [IU] via INTRAVENOUS

## 2017-08-23 SURGICAL SUPPLY — 145 items
ADAPTER CARDIO PERF ANTE/RETRO (ADAPTER) ×3 IMPLANT
BAG DECANTER FOR FLEXI CONT (MISCELLANEOUS) ×3 IMPLANT
BLADE CLIPPER SURG (BLADE) IMPLANT
BLADE STERNUM SYSTEM 6 (BLADE) ×3 IMPLANT
BLADE SURG 11 STRL SS (BLADE) ×3 IMPLANT
BLADE SURG 15 STRL LF DISP TIS (BLADE) ×2 IMPLANT
BLADE SURG 15 STRL SS (BLADE) ×1
CANISTER SUCT 3000ML PPV (MISCELLANEOUS) ×3 IMPLANT
CANN PRFSN .5XCNCT 15X34-48 (MISCELLANEOUS)
CANNULA ARTERIAL 007325 (MISCELLANEOUS) IMPLANT
CANNULA ARTERIAL 14F 007324 (MISCELLANEOUS) IMPLANT
CANNULA ARTERIAL 18F 007308 (MISCELLANEOUS) IMPLANT
CANNULA ARTERIAL 20F L7309 (MISCELLANEOUS) IMPLANT
CANNULA ARTERIAL 22F 007310 (MISCELLANEOUS) IMPLANT
CANNULA ARTERIAL 24F 007311 (MISCELLANEOUS) IMPLANT
CANNULA EZ GLIDE AORTIC 21FR (CANNULA) ×3 IMPLANT
CANNULA GUNDRY RCSP 15FR (MISCELLANEOUS) ×3 IMPLANT
CANNULA OPTISITE PERFUSION 22F (CANNULA) ×3 IMPLANT
CANNULA PRFSN .5XCNCT 15X34-48 (MISCELLANEOUS) IMPLANT
CANNULA SOFTFLOW AORTIC 7M21FR (CANNULA) ×3 IMPLANT
CANNULA VEN 2 STAGE (MISCELLANEOUS)
CATH CPB KIT OWEN (MISCELLANEOUS) ×3 IMPLANT
CATH HEART VENT LEFT (CATHETERS) ×2 IMPLANT
CATH THORACIC 28FR (CATHETERS) IMPLANT
CATH THORACIC 36FR (CATHETERS) ×3 IMPLANT
CATH THORACIC 36FR RT ANG (CATHETERS) IMPLANT
CAUTERY SURG HI TEMP FINE TIP (MISCELLANEOUS) ×3 IMPLANT
CLIP VESOCCLUDE MED 6/CT (CLIP) IMPLANT
CLIP VESOCCLUDE SM WIDE 24/CT (CLIP) IMPLANT
CLIP VESOCCLUDE SM WIDE 6/CT (CLIP) IMPLANT
CONN 1/2X1/2X1/2  BEN (MISCELLANEOUS)
CONN 1/2X1/2X1/2 BEN (MISCELLANEOUS) IMPLANT
CONN 3/8X3/8 GISH STERILE (MISCELLANEOUS) IMPLANT
CONN ST 1/4X3/8  BEN (MISCELLANEOUS)
CONN ST 1/4X3/8 BEN (MISCELLANEOUS) IMPLANT
CONN Y 3/8X3/8X3/8  BEN (MISCELLANEOUS)
CONN Y 3/8X3/8X3/8 BEN (MISCELLANEOUS) IMPLANT
CONT SPEC 4OZ CLIKSEAL STRL BL (MISCELLANEOUS) ×6 IMPLANT
COVER SURGICAL LIGHT HANDLE (MISCELLANEOUS) IMPLANT
CRADLE DONUT ADULT HEAD (MISCELLANEOUS) ×3 IMPLANT
DEVICE SUT CK QUICK LOAD MINI (Prosthesis & Implant Heart) ×3 IMPLANT
DRAIN CHANNEL 32F RND 10.7 FF (WOUND CARE) ×6 IMPLANT
DRAPE BILATERAL SPLIT (DRAPES) IMPLANT
DRAPE CARDIOVASCULAR INCISE (DRAPES) ×1
DRAPE CV SPLIT W-CLR ANES SCRN (DRAPES) IMPLANT
DRAPE INCISE IOBAN 66X45 STRL (DRAPES) ×6 IMPLANT
DRAPE SLUSH/WARMER DISC (DRAPES) ×3 IMPLANT
DRAPE SRG 135X102X78XABS (DRAPES) ×2 IMPLANT
DRSG AQUACEL AG ADV 3.5X14 (GAUZE/BANDAGES/DRESSINGS) ×3 IMPLANT
DRSG COVADERM 4X14 (GAUZE/BANDAGES/DRESSINGS) IMPLANT
ELECT REM PT RETURN 9FT ADLT (ELECTROSURGICAL) ×6
ELECTRODE REM PT RTRN 9FT ADLT (ELECTROSURGICAL) ×4 IMPLANT
FELT TEFLON 1X6 (MISCELLANEOUS) ×6 IMPLANT
FELT TEFLON 6X6 (MISCELLANEOUS) IMPLANT
GAUZE SPONGE 4X4 12PLY STRL (GAUZE/BANDAGES/DRESSINGS) ×3 IMPLANT
GLOVE BIO SURGEON STRL SZ 6 (GLOVE) ×12 IMPLANT
GLOVE BIO SURGEON STRL SZ 6.5 (GLOVE) ×9 IMPLANT
GLOVE BIO SURGEON STRL SZ7 (GLOVE) ×6 IMPLANT
GLOVE BIO SURGEON STRL SZ7.5 (GLOVE) ×3 IMPLANT
GLOVE BIOGEL PI IND STRL 6 (GLOVE) ×4 IMPLANT
GLOVE BIOGEL PI IND STRL 6.5 (GLOVE) ×8 IMPLANT
GLOVE BIOGEL PI INDICATOR 6 (GLOVE) ×2
GLOVE BIOGEL PI INDICATOR 6.5 (GLOVE) ×4
GLOVE INDICATOR 7.5 STRL GRN (GLOVE) ×3 IMPLANT
GLOVE ORTHO TXT STRL SZ7.5 (GLOVE) ×9 IMPLANT
GOWN STRL REUS W/ TWL LRG LVL3 (GOWN DISPOSABLE) ×16 IMPLANT
GOWN STRL REUS W/TWL LRG LVL3 (GOWN DISPOSABLE) ×8
GRAFT HEMASHIELD 24X30M (Vascular Products) ×3 IMPLANT
GRASPER SUT TROCAR 14GX15 (MISCELLANEOUS) ×3 IMPLANT
HEMOSTAT POWDER SURGIFOAM 1G (HEMOSTASIS) ×9 IMPLANT
INSERT FOGARTY SM (MISCELLANEOUS) IMPLANT
INSERT FOGARTY XLG (MISCELLANEOUS) ×6 IMPLANT
KIT BASIN OR (CUSTOM PROCEDURE TRAY) ×3 IMPLANT
KIT DILATOR VASC 18G NDL (KITS) ×3 IMPLANT
KIT DRAINAGE VACCUM ASSIST (KITS) ×3 IMPLANT
KIT SUCTION CATH 14FR (SUCTIONS) ×9 IMPLANT
KIT SUT CK MINI COMBO 4X17 (Prosthesis & Implant Heart) ×3 IMPLANT
KIT TURNOVER KIT B (KITS) ×3 IMPLANT
LEAD PACING MYOCARDI (MISCELLANEOUS) ×6 IMPLANT
LINE VENT (MISCELLANEOUS) ×3 IMPLANT
LOOP VESSEL SUPERMAXI WHITE (MISCELLANEOUS) IMPLANT
NEEDLE AORTIC AIR ASPIRATING (NEEDLE) IMPLANT
NS IRRIG 1000ML POUR BTL (IV SOLUTION) ×15 IMPLANT
PACK E OPEN HEART (SUTURE) ×3 IMPLANT
PACK OPEN HEART (CUSTOM PROCEDURE TRAY) ×3 IMPLANT
PAD ARMBOARD 7.5X6 YLW CONV (MISCELLANEOUS) ×6 IMPLANT
SEALANT SURG COSEAL 8ML (VASCULAR PRODUCTS) ×3 IMPLANT
SET CARDIOPLEGIA MPS 5001102 (MISCELLANEOUS) ×3 IMPLANT
SET IRRIG TUBING LAPAROSCOPIC (IRRIGATION / IRRIGATOR) ×3 IMPLANT
SPONGE LAP 18X18 X RAY DECT (DISPOSABLE) IMPLANT
SPONGE LAP 4X18 X RAY DECT (DISPOSABLE) IMPLANT
STOPCOCK 4 WAY LG BORE MALE ST (IV SETS) IMPLANT
SUT BONE WAX W31G (SUTURE) ×3 IMPLANT
SUT ETHIBON 2 0 V 52N 30 (SUTURE) ×12 IMPLANT
SUT ETHIBON EXCEL 2-0 V-5 (SUTURE) IMPLANT
SUT ETHIBOND 2 0 SH (SUTURE)
SUT ETHIBOND 2 0 SH 36X2 (SUTURE) IMPLANT
SUT ETHIBOND 2 0 V4 (SUTURE) IMPLANT
SUT ETHIBOND 2 0V4 GREEN (SUTURE) IMPLANT
SUT ETHIBOND 4 0 RB 1 (SUTURE) IMPLANT
SUT ETHIBOND V-5 VALVE (SUTURE) IMPLANT
SUT ETHIBOND X763 2 0 SH 1 (SUTURE) ×9 IMPLANT
SUT MNCRL AB 3-0 PS2 18 (SUTURE) ×6 IMPLANT
SUT PDS AB 1 CTX 36 (SUTURE) ×12 IMPLANT
SUT PROLENE 3 0 RB 1 (SUTURE) IMPLANT
SUT PROLENE 3 0 SH DA (SUTURE) ×6 IMPLANT
SUT PROLENE 3 0 SH1 36 (SUTURE) ×9 IMPLANT
SUT PROLENE 4 0 RB 1 (SUTURE) ×6
SUT PROLENE 4 0 SH DA (SUTURE) ×3 IMPLANT
SUT PROLENE 4-0 RB1 .5 CRCL 36 (SUTURE) ×12 IMPLANT
SUT PROLENE 5 0 C 1 36 (SUTURE) IMPLANT
SUT PROLENE 6 0 C 1 30 (SUTURE) IMPLANT
SUT SILK  1 MH (SUTURE) ×1
SUT SILK 1 MH (SUTURE) ×2 IMPLANT
SUT SILK 2 0 SH CR/8 (SUTURE) IMPLANT
SUT SILK 3 0 SH CR/8 (SUTURE) IMPLANT
SUT STEEL 6MS V (SUTURE) ×3 IMPLANT
SUT STEEL STERNAL CCS#1 18IN (SUTURE) IMPLANT
SUT STEEL SZ 6 DBL 3X14 BALL (SUTURE) ×6 IMPLANT
SUT VIC AB 1 CT1 18XCR BRD 8 (SUTURE) IMPLANT
SUT VIC AB 1 CT1 8-18 (SUTURE)
SUT VIC AB 1 CTX 18 (SUTURE) ×3 IMPLANT
SUT VIC AB 1 CTX 27 (SUTURE) IMPLANT
SUT VIC AB 2-0 CT1 27 (SUTURE)
SUT VIC AB 2-0 CT1 TAPERPNT 27 (SUTURE) IMPLANT
SUT VIC AB 2-0 CTX 27 (SUTURE) IMPLANT
SUT VIC AB 2-0 CTX 36 (SUTURE) ×6 IMPLANT
SUT VIC AB 3-0 SH 27 (SUTURE)
SUT VIC AB 3-0 SH 27X BRD (SUTURE) IMPLANT
SUT VIC AB 3-0 X1 27 (SUTURE) IMPLANT
SUT VICRYL 4-0 PS2 18IN ABS (SUTURE) IMPLANT
SYR 10ML KIT SKIN ADHESIVE (MISCELLANEOUS) ×3 IMPLANT
SYSTEM SAHARA CHEST DRAIN ATS (WOUND CARE) ×3 IMPLANT
TAPE CLOTH SURG 4X10 WHT LF (GAUZE/BANDAGES/DRESSINGS) ×3 IMPLANT
TAPE PAPER 2X10 WHT MICROPORE (GAUZE/BANDAGES/DRESSINGS) ×3 IMPLANT
TOWEL GREEN STERILE (TOWEL DISPOSABLE) ×3 IMPLANT
TOWEL GREEN STERILE FF (TOWEL DISPOSABLE) ×3 IMPLANT
TRAY CATH LUMEN 1 20CM STRL (SET/KITS/TRAYS/PACK) IMPLANT
TRAY FOLEY SILVER 16FR TEMP (SET/KITS/TRAYS/PACK) ×3 IMPLANT
TUBE CONNECTING 12X1/4 (SUCTIONS) ×3 IMPLANT
UNDERPAD 30X30 (UNDERPADS AND DIAPERS) ×3 IMPLANT
VALVE AORTIC SZ25 INSP/RESIL (Prosthesis & Implant Heart) ×3 IMPLANT
VENT LEFT HEART 12002 (CATHETERS) ×3
WATER STERILE IRR 1000ML POUR (IV SOLUTION) ×6 IMPLANT
YANKAUER SUCT BULB TIP NO VENT (SUCTIONS) ×3 IMPLANT

## 2017-08-23 NOTE — Transfer of Care (Signed)
Immediate Anesthesia Transfer of Care Note  Patient: Reginald [name]  Procedure(s) Performed: AORTIC VALVE REPLACEMENT (AVR) USING INSPIRIS RESILIA AORTIC VALVE SIZE 25 MM (N/A Chest) THORACIC ASCENDING ANEURYSM REPAIR (AAA) USING HEMASHIELD GOLD KNITTED MICROVEL DOUBLE VELOUR VASCULAR GRAFT D: 24 MM  L: 30 CM (N/A ) TRANSESOPHAGEAL ECHOCARDIOGRAM (TEE) (N/A )  Patient Location: SICU  Anesthesia Type:General  Level of Consciousness: sedated and Patient remains intubated per anesthesia plan  Airway & Oxygen Therapy: Patient remains intubated per anesthesia plan and Patient placed on Ventilator (see vital sign flow sheet for setting)  Post-op Assessment: Report given to RN and Post -op Vital signs reviewed and stable  Post vital signs: Reviewed and stable  Last Vitals:  Vitals Value Taken Time  BP    Temp    Pulse    Resp    SpO2      Last Pain:  Vitals:   08/23/17 0612  TempSrc: Oral      Patients Stated Pain Goal: 3 (45/80/99 8338)  Complications: No apparent anesthesia complications

## 2017-08-23 NOTE — Op Note (Signed)
CARDIOTHORACIC SURGERY OPERATIVE NOTE  Date of Procedure:  08/23/2017  Preoperative Diagnosis:   Bicuspid Aortic Valve  Severe Aortic Stenosis   Ascending Thoracic Aortic Aneurysm  Postoperative Diagnosis: Same   Procedure:    Aortic Valve Replacement  Edwards Inspiris Resilia Stented Bovine Pericardial Tissue Valve (size 25 mm, ref # [id number], serial # [id number])   Ascending Aortic Aneurysm Repair  Hemashield Gold supracoronary straight graft (size 24 mm, cat # [id number], lot # [id number])    Surgeon: Valentina Gu. Roxy Manns, MD  Assistant: Detron Giovanni, PA-C  Anesthesia: Albertha Ghee, MD  Operative Findings:  [place] type 0 bicuspid aortic valve  Severe aortic stenosis  Mild aortic insufficiency  Ascending thoracic aortic aneurysm  Normal left ventricular systolic function               BRIEF CLINICAL NOTE AND INDICATIONS FOR SURGERY  Patient is a 47 year old morbidly obese male with long-standing history of heart murmur, chronic diastolic congestive heart failure, hypertension, chronic venous insufficiency, obstructive sleep apnea on CPAP,andlong-standing tobacco abuse with COPD who has been referred for surgical consultation to discuss treatment options for management of suspected bicuspid aortic valve with severe aortic stenosis. Patient states that he has been told he had a heart murmur essentially all of his life and he was told in the past that he probably had a bicuspid aortic valve.He describes a long history of symptoms of exertional shortness of breath which have progressed substantially over the past 6 months. He was referred for cardiology consultation and initially seen in consultation by Dr. Griselda Miner May 10, 2017. He underwent transthoracic echocardiogram on May 25, 2017. Echocardiogram was reported to demonstrate the presence of severe aortic stenosis with normal left ventricular systolic function. Cardiothoracic surgicalconsultation  was requested.  The patient has been seen in consultation and counseled at length regarding the indications, risks and potential benefits of surgery.  All questions have been answered, and the patient provides full informed consent for the operation as described.    DETAILS OF THE OPERATIVE PROCEDURE  Preparation:  The patient is brought to the operating room on the above mentioned date and central monitoring was established by the anesthesia team including placement of Swan-Ganz catheter and radial arterial line.  There was mild pulmonary hypertension at baseline.  The patient is placed in the supine position on the operating table.  Intravenous antibiotics are administered. General endotracheal anesthesia is induced uneventfully. A Foley catheter is placed.  Baseline transesophageal echocardiogram was performed.  Findings were notable for bicuspid aortic valve with severe aortic stenosis and mild aortic insufficiency.  There was normal left ventricular systolic function.  [name] chest, abdomen, both groins, and both lower extremities are prepared and draped in a sterile manner. A time out procedure is performed.   Surgical Approach:  A median sternotomy incision was performed and the pericardium is opened. The ascending aorta is dilated in appearance.   There is no significant aortic calcification.  The aortic aneurysm tapers to near normal caliber at the level of the takeoff of the innominate artery.   Extracorporeal Cardiopulmonary Bypass and Myocardial Protection:  The innominate artery is cannulated directly using a 24 French femoral arterial cannula placed using Seldinger technique over a guidewire which is advanced through the transverse aortic arch into the descending thoracic aorta using TEE guidance.  The right atrium is cannulated for cardiopulmonary bypass using a standard 2-stage cannula.  Adequate heparinization is verified.   A retrograde cardioplegia cannula is placed  through the  right atrium into the coronary sinus.  The operative field was continuously flooded with carbon dioxide gas.  The entire pre-bypass portion of the operation was notable for stable hemodynamics.  Cardiopulmonary bypass was begun and the surface of the heart is inspected.  A cardioplegia cannula is placed in the ascending aorta.  A temperature probe was placed in the interventricular septum.  The patient is cooled to 32C systemic temperature.  The aortic cross clamp is applied across the aorta immediately before the takeoff of the innominate artery.  Cardioplegia is delivered initially in an antegrade fashion through the aortic root using modified [place] cold blood cardioplegia ([place] blood cardioplegia protocol).   The initial cardioplegic arrest is rapid with early diastolic arrest.  Repeat doses of cardioplegia are administered at 90 minutes and every 30 minutes thereafter through the coronary sinus catheter in order to maintain completely flat electrocardiogram.  Myocardial protection was felt to be excellent.   Resection of Ascending Thoracic Aortic Aneurysm:  The ascending aortic aneurysm is dissected away from the pulmonary artery.  A transverse aortotomy incision was performed approximately 1.5 cm above the takeoff of the right coronary artery.  The left main coronary artery is identified in the transverse aortotomy continued across approximately 1.5 cm above the left main.  The entire ascending thoracic aortic aneurysm is resected to approximately 1.0 cm beneath the aortic cross-clamp.   Aortic Valve Replacement:  The aortic valve was inspected and noted to be a congenitally bicuspid aortic valve with only 2 commissures and no raphae.  The left main and the right coronary arteries were 180 degrees opposed.  There was severe aortic stenosis.  The aortic valve leaflets were excised sharply and the aortic annulus decalcified.  Decalcification was notably straightforward  although there was a thick fibrous rubbery tissue which needed to be debrided.  The aortic annulus was sized to accept a 25 mm prosthesis.  The aortic root and left ventricle were irrigated with copious cold saline solution.  Aortic valve replacement was performed using interrupted horizontal mattress 2-0 Ethibond pledgeted sutures with pledgets in the subannular position.  An Edwards Inspiris Resilia stented bovine pericardial tissue valve (size 25 mm, ref# [id number], serial # [id number]) was implanted uneventfully.  All sutures were secured using a Cor-knot device.  The valve seated appropriately with adequate space beneath the left main and right coronary artery.    Replacement of Ascending Thoracic Aorta:  The ascending aorta is replaced using a supra coronary straight graft (Hemashield Gold, size 24 mm, cat # [id number], lot # [id number]).  The proximal suture line is constructed using interrupted 2-0 Ethibond horizontal mattress pledgeted sutures with a second layer of running 4-0 Prolene suture.  The distal end of the graft is trimmed and beveled to an appropriate length.  The distal suture line is constructed to the distal aorta immediately below the aortic cross-clamp using running 4-0 Prolene suture with Teflon felt strip to buttress the suture line.   Procedure Completion:  One final dose of warm retrograde "reanimation dose" cardioplegia was administered retrograde through the coronary sinus catheter while all air was evacuated through a small hole in the aortic graft.  The aortic cross clamp was removed after a total cross clamp time of 129 minutes.  Epicardial pacing wires are fixed to the right ventricular outflow tract and to the right atrial appendage. The patient is rewarmed to 37C temperature. The aortic and left ventricular vents are removed.  The patient is weaned and disconnected from cardiopulmonary  bypass.  [name] rhythm at separation from bypass was AV paced.  The patient was  weaned from cardiopulmonary bypass without any inotropic support. Total cardiopulmonary bypass time for the operation was 153 minutes.  Followup transesophageal echocardiogram performed after separation from bypass revealed a well-seated aortic valve prosthesis that was functioning normally and without any sign of perivalvular leak.  Left ventricular function was unchanged from preoperatively.  The aortic and venous cannula were removed uneventfully. Protamine was administered to reverse the anticoagulation. The mediastinum and pleural space were inspected for hemostasis and irrigated with saline solution. The mediastinum was drained using 2 chest tubes placed through separate stab incisions inferiorly.  The soft tissues anterior to the aorta were reapproximated loosely. The sternum is closed with double strength sternal wire. The soft tissues anterior to the sternum were closed in multiple layers and the skin is closed with a running subcuticular skin closure.  The post-bypass portion of the operation was notable for stable rhythm and hemodynamics.  No blood products were administered during the operation.   Disposition:  The patient tolerated the procedure well and is transported to the surgical intensive care in stable condition. There are no intraoperative complications. All sponge instrument and needle counts are verified correct at completion of the operation.    Valentina Gu. Roxy Manns MD 08/23/2017 1:28 PM

## 2017-08-23 NOTE — Progress Notes (Signed)
Blood pressure reading low, pulse rate 62. Notified Dr.Green if ordered beta blocker should be given, stated to wait til assigned anesthesia doctor comes to see pt.

## 2017-08-23 NOTE — Anesthesia Procedure Notes (Signed)
Central Venous Catheter Insertion Performed by: Albertha Ghee, MD, anesthesiologist Start/End4/08/2017 7:11 AM, 08/23/2017 7:22 AM Patient location: Pre-op. Preanesthetic checklist: patient identified, IV checked, site marked, risks and benefits discussed, surgical consent, monitors and equipment checked, pre-op evaluation, timeout performed and anesthesia consent Position: Trendelenburg Lidocaine 1% used for infiltration and patient sedated Hand hygiene performed , maximum sterile barriers used  and Seldinger technique used Catheter size: 9 Fr Central line was placed.MAC introducer Swan type:thermodilation Procedure performed using ultrasound guided technique. Ultrasound Notes:anatomy identified, needle tip was noted to be adjacent to the nerve/plexus identified, no ultrasound evidence of intravascular and/or intraneural injection and image(s) printed for medical record Attempts: 1 Following insertion, line sutured, dressing applied and Biopatch. Post procedure assessment: blood return through all ports, free fluid flow and no air  Patient tolerated the procedure well with no immediate complications.

## 2017-08-23 NOTE — Procedures (Signed)
Extubation Procedure Note  Patient Details:   Name: Reginald [name] DOB: 08-31-1970 MRN: [id number]   Airway Documentation:     Evaluation  O2 sats: stable throughout Complications: No apparent complications Patient did tolerate procedure well. Bilateral Breath Sounds: Diminished   Yes   Pt extubated to 3L [place] per Open Heart Rapid Wean Protocol. Pt with positive cuff leak prior to extubation. Pt able to speak, strong productive cough post extubation. VS within normal limits. ABG within normal limits, NIF -60, VC 0.90 L. No stridor noted. RT will continue to monitor.   [name] 08/23/2017, 6:10 PM

## 2017-08-23 NOTE — Progress Notes (Signed)
[facility] BRIEF SICU PROGRESS NOTE  Day of Surgery  S/P Procedure(s) (LRB): AORTIC VALVE REPLACEMENT (AVR) USING INSPIRIS RESILIA AORTIC VALVE SIZE 25 MM (N/A) THORACIC ASCENDING ANEURYSM REPAIR (AAA) USING HEMASHIELD GOLD KNITTED MICROVEL DOUBLE VELOUR VASCULAR GRAFT D: 24 MM  L: 30 CM (N/A) TRANSESOPHAGEAL ECHOCARDIOGRAM (TEE) (N/A)   Extubated uneventfully Feels sore in chest and shoulders NSR w/ stable hemodynamics Minimal chest tube output Excellent UOP  Plan: Continue routine early postop  Reginald Alberts, MD 08/23/2017 8:02 PM

## 2017-08-23 NOTE — Progress Notes (Signed)
  Echocardiogram 2D Echocardiogram has been performed.  Jannett Celestine 08/23/2017, 8:28 AM

## 2017-08-23 NOTE — Anesthesia Procedure Notes (Signed)
Arterial Line Insertion Start/End4/08/2017 2:50 PM, 08/23/2017 3:00 PM Performed by: Wilburn Cornelia, CRNA, CRNA  Patient location: ICU. Preanesthetic checklist: patient identified, IV checked, site marked, risks and benefits discussed, surgical consent, monitors and equipment checked, pre-op evaluation and timeout performed Lidocaine 1% used for infiltration Left, radial was placed Catheter size: 20 G  Attempts: 1 Procedure performed without using ultrasound guided technique. Following insertion, Biopatch and dressing applied. Post procedure assessment: normal  Patient tolerated the procedure well with no immediate complications.

## 2017-08-23 NOTE — Anesthesia Preprocedure Evaluation (Signed)
Anesthesia Evaluation  Patient identified by MRN, date of birth, ID band Patient awake    Reviewed: Allergy & Precautions, H&P , NPO status , Patient's Chart, lab work & pertinent test results  Airway Mallampati: II   Neck ROM: full    Dental   Pulmonary shortness of breath, asthma , sleep apnea , COPD, Current Smoker,    breath sounds clear to auscultation       Cardiovascular hypertension, + Peripheral Vascular Disease and +CHF  + Valvular Problems/Murmurs AS  Rhythm:regular Rate:Normal  biscuspid aortic valve.  Thoracic ascending aneurysm   Neuro/Psych Anxiety    GI/Hepatic   Endo/Other  Morbid obesity  Renal/GU      Musculoskeletal   Abdominal   Peds  Hematology   Anesthesia Other Findings   Reproductive/Obstetrics                             Anesthesia Physical Anesthesia Plan  ASA: III  Anesthesia Plan: General   Post-op Pain Management:    Induction: Intravenous  PONV Risk Score and Plan: 1 and Ondansetron, Dexamethasone, Midazolam and Treatment may vary due to age or medical condition  Airway Management Planned: Oral ETT  Additional Equipment: Arterial line, CVP, PA Cath, TEE and Ultrasound Guidance Line Placement  Intra-op Plan: Utilization Of Total Body Hypothermia per surgeon request  Post-operative Plan: Post-operative intubation/ventilation  Informed Consent: I have reviewed the patients History and Physical, chart, labs and discussed the procedure including the risks, benefits and alternatives for the proposed anesthesia with the patient or authorized representative who has indicated his/her understanding and acceptance.     Plan Discussed with: CRNA, Anesthesiologist and Surgeon  Anesthesia Plan Comments:         Anesthesia Quick Evaluation

## 2017-08-23 NOTE — Interval H&P Note (Signed)
History and Physical Interval Note:  08/23/2017 6:51 AM  Reginald [name]  has presented today for surgery, with the diagnosis of AS TAA  The various methods of treatment have been discussed with the patient and family. After consideration of risks, benefits and other options for treatment, the patient has consented to  Procedure(s): AORTIC VALVE REPLACEMENT (AVR) (N/A) THORACIC ASCENDING ANEURYSM REPAIR (AAA) (N/A) TRANSESOPHAGEAL ECHOCARDIOGRAM (TEE) (N/A) as a surgical intervention .  [name] history has been reviewed, patient examined, no change in status, stable for surgery.  I have reviewed the patient's chart and labs.  Questions were answered to the patient's satisfaction.     Rexene Alberts

## 2017-08-23 NOTE — Brief Op Note (Signed)
08/23/2017  1:22 PM  PATIENT:  Reginald [name]  47 y.o. male  PRE-OPERATIVE DIAGNOSIS:  AS TAA  POST-OPERATIVE DIAGNOSIS:  AS TAA  PROCEDURE:  Procedure(s): AORTIC VALVE REPLACEMENT (AVR) USING INSPIRIS RESILIA AORTIC VALVE SIZE 25 MM (N/A) THORACIC ASCENDING ANEURYSM REPAIR (AAA) USING HEMASHIELD GOLD KNITTED MICROVEL DOUBLE VELOUR VASCULAR GRAFT D: 24 MM  L: 30 CM (N/A) TRANSESOPHAGEAL ECHOCARDIOGRAM (TEE) (N/A)  SURGEON:  Surgeon(s) and Role:    Rexene Alberts, MD - Primary  PHYSICIAN ASSISTANT: WAYNE GOLD PA-C  ANESTHESIA:   general  EBL:  2650 mL   BLOOD ADMINISTERED:none  DRAINS: PLEURAL AND PERICARDIAL CHEST DRAINS   LOCAL MEDICATIONS USED:  NONE  SPECIMEN:  Source of Specimen:  ANEURYSM AND AORTIC VALVE LEAFLETS  DISPOSITION OF SPECIMEN:  PATHOLOGY  COUNTS:  YES  TOURNIQUET:  * No tourniquets in log *  DICTATION: .Dragon Dictation  PLAN OF CARE: Admit to inpatient   PATIENT DISPOSITION:  ICU - intubated and hemodynamically stable.   Delay start of Pharmacological VTE agent (>24hrs) due to surgical blood loss or risk of bleeding: yes

## 2017-08-23 NOTE — Anesthesia Procedure Notes (Signed)
Procedure Name: Intubation Date/Time: 08/23/2017 8:03 AM Performed by: Imagene Riches, CRNA Pre-anesthesia Checklist: Patient identified, Emergency Drugs available, Suction available and Patient being monitored Patient Re-evaluated:Patient Re-evaluated prior to induction Oxygen Delivery Method: Circle System Utilized Preoxygenation: Pre-oxygenation with 100% oxygen Induction Type: IV induction Laryngoscope Size: Glidescope and 4 Grade View: Grade I Tube type: Oral Tube size: 8.0 mm Number of attempts: 1 Airway Equipment and Method: Stylet and Video-laryngoscopy Placement Confirmation: ETT inserted through vocal cords under direct vision,  positive ETCO2 and breath sounds checked- equal and bilateral Secured at: 22 cm Tube secured with: Tape Dental Injury: Teeth and Oropharynx as per pre-operative assessment

## 2017-08-23 NOTE — Progress Notes (Signed)
Initiated Open Heart Rapid Wean per Protocol 

## 2017-08-23 NOTE — Plan of Care (Signed)
Day of Surgery.  Plan of care met at this time.

## 2017-08-23 NOTE — Anesthesia Procedure Notes (Signed)
Arterial Line Insertion Start/End4/08/2017 6:45 AM, 08/23/2017 7:15 AM Performed by: Freddie Breech, CRNA, CRNA  Patient location: Pre-op. Preanesthetic checklist: patient identified, IV checked, site marked, risks and benefits discussed, surgical consent, monitors and equipment checked, pre-op evaluation, timeout performed and anesthesia consent Patient sedated Right, radial was placed Catheter size: 20 G Hand hygiene performed  and maximum sterile barriers used   Attempts: 3 Procedure performed without using ultrasound guided technique. Following insertion, Biopatch and dressing applied. Post procedure assessment: normal  Patient tolerated the procedure with difficulty. Additional procedure comments: Two unsuccessful attempts on left radial artery by Imagene Riches, CRNA. Patient became light headed and bradycardic with arterial line placement prior to sedation. Marland Kitchen

## 2017-08-23 NOTE — Progress Notes (Signed)
Advanced ETT 2cm per MD, [facility] equal. Will cont to monitor.

## 2017-08-24 ENCOUNTER — Encounter (HOSPITAL_COMMUNITY): Payer: Self-pay | Admitting: Thoracic Surgery (Cardiothoracic Vascular Surgery)

## 2017-08-24 ENCOUNTER — Inpatient Hospital Stay (HOSPITAL_COMMUNITY): Payer: BLUE CROSS/BLUE SHIELD

## 2017-08-24 LAB — POCT I-STAT 3, ART BLOOD GAS (G3+)
ACID-BASE EXCESS: 2 mmol/L (ref 0.0–2.0)
BICARBONATE: 27 mmol/L (ref 20.0–28.0)
O2 Saturation: 93 %
PO2 ART: 64 mmHg — AB (ref 83.0–108.0)
TCO2: 28 mmol/L (ref 22–32)
pCO2 arterial: 39.9 mmHg (ref 32.0–48.0)
pH, Arterial: 7.436 (ref 7.350–7.450)

## 2017-08-24 LAB — CREATININE, SERUM
CREATININE: 0.99 mg/dL (ref 0.61–1.24)
GFR calc Af Amer: >60 mL/min (ref >60)

## 2017-08-24 LAB — POCT I-STAT, CHEM 8
BUN: 23 mg/dL — AB (ref 6–20)
Calcium, Ion: 1.11 mmol/L — ABNORMAL LOW (ref 1.15–1.40)
Chloride: 99 mmol/L — ABNORMAL LOW (ref 101–111)
Creatinine, Ser: 0.9 mg/dL (ref 0.61–1.24)
Glucose, Bld: 106 mg/dL — ABNORMAL HIGH (ref 65–99)
HEMATOCRIT: 36 % — AB (ref 39.0–52.0)
Hemoglobin: 12.2 g/dL — ABNORMAL LOW (ref 13.0–17.0)
Potassium: 3.8 mmol/L (ref 3.5–5.1)
SODIUM: 138 mmol/L (ref 135–145)
TCO2: 26 mmol/L (ref 22–32)

## 2017-08-24 LAB — CBC
HCT: 41.7 % (ref 39.0–52.0)
HCT: 38.8 % — ABNORMAL LOW (ref 39.0–52.0)
Hemoglobin: 13.6 g/dL (ref 13.0–17.0)
Hemoglobin: 12.4 g/dL — ABNORMAL LOW (ref 13.0–17.0)
MCH: 27 pg (ref 26.0–34.0)
MCH: 27 pg (ref 26.0–34.0)
MCHC: 32.6 g/dL (ref 30.0–36.0)
MCHC: 32 g/dL (ref 30.0–36.0)
MCV: 82.9 fL (ref 78.0–100.0)
MCV: 84.5 fL (ref 78.0–100.0)
PLATELETS: 178 10*3/uL (ref 150–400)
PLATELETS: 169 10*3/uL (ref 150–400)
RBC: 5.03 MIL/uL (ref 4.22–5.81)
RBC: 4.59 MIL/uL (ref 4.22–5.81)
RDW: 15.9 % — ABNORMAL HIGH (ref 11.5–15.5)
RDW: 15.9 % — ABNORMAL HIGH (ref 11.5–15.5)
WBC: 18 10*3/uL — AB (ref 4.0–10.5)
WBC: 20.7 10*3/uL — AB (ref 4.0–10.5)

## 2017-08-24 LAB — BASIC METABOLIC PANEL
ANION GAP: 10 (ref 5–15)
BUN: 19 mg/dL (ref 6–20)
CO2: 24 mmol/L (ref 22–32)
Calcium: 8.2 mg/dL — ABNORMAL LOW (ref 8.9–10.3)
Chloride: 104 mmol/L (ref 101–111)
Creatinine, Ser: 0.87 mg/dL (ref 0.61–1.24)
Glucose, Bld: 124 mg/dL — ABNORMAL HIGH (ref 65–99)
POTASSIUM: 4.2 mmol/L (ref 3.5–5.1)
SODIUM: 138 mmol/L (ref 135–145)

## 2017-08-24 LAB — GLUCOSE, CAPILLARY
GLUCOSE-CAPILLARY: 117 mg/dL — AB (ref 65–99)
Glucose-Capillary: 100 mg/dL — ABNORMAL HIGH (ref 65–99)
Glucose-Capillary: 108 mg/dL — ABNORMAL HIGH (ref 65–99)
Glucose-Capillary: 110 mg/dL — ABNORMAL HIGH (ref 65–99)
Glucose-Capillary: 116 mg/dL — ABNORMAL HIGH (ref 65–99)
Glucose-Capillary: 185 mg/dL — ABNORMAL HIGH (ref 65–99)

## 2017-08-24 LAB — POCT I-STAT 4, (NA,K, GLUC, HGB,HCT)
GLUCOSE: 108 mg/dL — AB (ref 65–99)
HCT: 43 % (ref 39.0–52.0)
Hemoglobin: 14.6 g/dL (ref 13.0–17.0)
POTASSIUM: 3.3 mmol/L — AB (ref 3.5–5.1)
SODIUM: 141 mmol/L (ref 135–145)

## 2017-08-24 LAB — MAGNESIUM
MAGNESIUM: 2.5 mg/dL — AB (ref 1.7–2.4)
MAGNESIUM: 2.3 mg/dL (ref 1.7–2.4)

## 2017-08-24 MED ORDER — ORAL CARE MOUTH RINSE
15.0000 mL | Freq: Two times a day (BID) | OROMUCOSAL | Status: DC
  Administered 2017-08-24 – 2017-09-01 (×8): 15 mL via OROMUCOSAL

## 2017-08-24 MED ORDER — KETOROLAC TROMETHAMINE 15 MG/ML IJ SOLN
15.0000 mg | Freq: Four times a day (QID) | INTRAMUSCULAR | Status: DC
  Administered 2017-08-24 – 2017-08-25 (×4): 15 mg via INTRAVENOUS
  Filled 2017-08-24 (×4): qty 1

## 2017-08-24 MED ORDER — ENOXAPARIN SODIUM 40 MG/0.4ML ~~LOC~~ SOLN
40.0000 mg | Freq: Every day | SUBCUTANEOUS | Status: DC
  Administered 2017-08-25 – 2017-08-27 (×3): 40 mg via SUBCUTANEOUS
  Filled 2017-08-24 (×3): qty 0.4

## 2017-08-24 MED ORDER — POTASSIUM CHLORIDE 10 MEQ/50ML IV SOLN
10.0000 meq | INTRAVENOUS | Status: AC
  Administered 2017-08-24 (×2): 10 meq via INTRAVENOUS
  Filled 2017-08-24 (×2): qty 50

## 2017-08-24 MED FILL — Magnesium Sulfate Inj 50%: INTRAMUSCULAR | Qty: 10 | Status: AC

## 2017-08-24 MED FILL — Heparin Sodium (Porcine) Inj 1000 Unit/ML: INTRAMUSCULAR | Qty: 30 | Status: AC

## 2017-08-24 MED FILL — Heparin Sodium (Porcine) Inj 1000 Unit/ML: INTRAMUSCULAR | Qty: 2500 | Status: AC

## 2017-08-24 MED FILL — Potassium Chloride Inj 2 mEq/ML: INTRAVENOUS | Qty: 40 | Status: AC

## 2017-08-24 NOTE — Progress Notes (Addendum)
[facility] DAILY ICU PROGRESS NOTE                   [address]            [place] [address]          [phone]   1 Day Post-Op Procedure(s) (LRB): AORTIC VALVE REPLACEMENT (AVR) USING INSPIRIS RESILIA AORTIC VALVE SIZE 25 MM (N/A) THORACIC ASCENDING ANEURYSM REPAIR (AAA) USING HEMASHIELD GOLD KNITTED MICROVEL DOUBLE VELOUR VASCULAR GRAFT D: 24 MM  L: 30 CM (N/A) TRANSESOPHAGEAL ECHOCARDIOGRAM (TEE) (N/A)  Total Length of Stay:  LOS: 1 day   Subjective: Sore but overall feels reasonably well  Objective: Vital signs in last 24 hours: Temp:  [96.1 F (35.6 C)-98.6 F (37 C)] 97.2 F (36.2 C) (04/05 0500) Pulse Rate:  [80] 80 (04/05 0530) Cardiac Rhythm: Atrial paced (04/05 0300) Resp:  [18-27] 23 (04/05 0530) BP: (92-125)/(62-89) 103/71 (04/05 0500) SpO2:  [90 %-100 %] 94 % (04/05 0530) Arterial Line BP: (95-126)/(58-71) 120/69 (04/05 0530) FiO2 (%):  [50 %-80 %] 80 % (04/04 1511) Weight:  [417 lb (189.1 kg)-427 lb 3.2 oz (193.8 kg)] 427 lb 3.2 oz (193.8 kg) (04/05 0500)  Filed Weights   08/23/17 2321 08/24/17 0500  Weight: (!) 417 lb (189.1 kg) (!) 427 lb 3.2 oz (193.8 kg)    Weight change:    Hemodynamic parameters for last 24 hours: PAP: (32-54)/(11-32) 54/25 CO:  [5.3 L/min-7.5 L/min] 6.2 L/min CI:  [1.9 L/min/m2-3.2 L/min/m2] 2.2 L/min/m2  Intake/Output from previous day: 04/04 0701 - 04/05 0700 In: 4716.9 [P.O.:240; I.V.:3726.9; IV Piggyback:750] Out: 9924 [QASTM:1962; Blood:2650; Chest Tube:330]  Intake/Output this shift: No intake/output data recorded.  Current Meds: Scheduled Meds: . acetaminophen  1,000 mg Oral Q6H  . allopurinol  300 mg Oral Daily  . aspirin EC  325 mg Oral Daily  . bisacodyl  10 mg Oral Daily   Or  . bisacodyl  10 mg Rectal Daily  . citalopram  20 mg Oral QHS  . docusate sodium  200 mg Oral Daily  . [START ON 08/25/2017] enoxaparin (LOVENOX) injection  40 mg Subcutaneous QHS  . insulin aspart  0-24 Units  Subcutaneous Q4H  . ketorolac  15 mg Intravenous Q6H  . metoprolol tartrate  12.5 mg Oral BID  . [START ON 08/25/2017] pantoprazole  40 mg Oral Daily  . sodium chloride flush  3 mL Intravenous Q12H   Continuous Infusions: . sodium chloride 250 mL (08/24/17 0550)  . albumin human    .  ceFAZolin (ANCEF) IV Stopped (08/24/17 0142)  . lactated ringers    . lactated ringers     PRN Meds:.albumin human, clonazePAM, metoprolol tartrate, morphine injection, ondansetron (ZOFRAN) IV, oxyCODONE, sodium chloride flush, traMADol  General appearance: alert, cooperative and no distress Neurologic: intact Heart: regular rate and rhythm Lungs: some ronchi in upper airway Abdomen: soft, non tender Extremities: some edema Wound: dressing CDI  Lab Results: CBC: Recent Labs    08/23/17 2052 08/23/17 2059 08/24/17 0323  WBC 18.7*  --  18.0*  HGB 14.0 13.9 13.6  HCT 42.8 41.0 41.7  PLT 186  --  178   BMET:  Recent Labs    08/21/17 1048  08/23/17 2059 08/24/17 0323  NA 135   < > 139 138  K 3.2*   < > 4.6 4.2  CL 100*   < > 104 104  CO2 24  --   --  24  GLUCOSE 110*   < >  133* 124*  BUN 15   < > 22* 19  CREATININE 0.78   < > 0.80 0.87  CALCIUM 9.1  --   --  8.2*   < > = values in this interval not displayed.    CMET: Lab Results  Component Value Date   WBC 18.0 (H) 08/24/2017   HGB 13.6 08/24/2017   HCT 41.7 08/24/2017   PLT 178 08/24/2017   GLUCOSE 124 (H) 08/24/2017   ALT 25 08/21/2017   AST 29 08/21/2017   NA 138 08/24/2017   K 4.2 08/24/2017   CL 104 08/24/2017   CREATININE 0.87 08/24/2017   BUN 19 08/24/2017   CO2 24 08/24/2017   INR 1.23 08/23/2017   HGBA1C 5.4 08/21/2017      PT/INR:  Recent Labs    08/23/17 1413  LABPROT 15.4*  INR 1.23   Radiology: Dg Chest Port 1 View  Result Date: 08/23/2017 CLINICAL DATA:  Status post aortic valve replacement and ascending aortic aneurysm repair today. EXAM: PORTABLE CHEST 1 VIEW COMPARISON:  PA and lateral chest  08/21/2017. FINDINGS: Endotracheal tube is in place with the tip in good position at the level of the clavicular heads. Right IJ approach Swan-Ganz catheter tip is in the proximal right main pulmonary artery. Mediastinal drain is in place. NG tube courses into the stomach and below the inferior margin of the film. There is cardiomegaly and vascular congestion. No pneumothorax or consolidative process. IMPRESSION: Support lines project in good position.  Negative for pneumothorax. Cardiomegaly and vascular congestion. Electronically Signed   By: Inge Rise M.D.   On: 08/23/2017 14:59     Assessment/Plan: S/P Procedure(s) (LRB): AORTIC VALVE REPLACEMENT (AVR) USING INSPIRIS RESILIA AORTIC VALVE SIZE 25 MM (N/A) THORACIC ASCENDING ANEURYSM REPAIR (AAA) USING HEMASHIELD GOLD KNITTED MICROVEL DOUBLE VELOUR VASCULAR GRAFT D: 24 MM  L: 30 CM (N/A) TRANSESOPHAGEAL ECHOCARDIOGRAM (TEE) (N/A)  1 hemodyn stable in sinus rhythm, no inotropes 2 electrolytes, renal fxn normal 3 H/H excellent, platelets normal, some leukocytosis 4 CXR mild atx- routine pulm toilet 5 toradol added for pain- monitor creat closely 6 lovenox for DVT prophy- mobilize as able- will get PT consult 7 blood sugars controlled 8 d/c tubes and lines Patrick Jupiter E Gold 08/24/2017 7:56 AM    I have seen and examined the patient and agree with the assessment and plan as outlined.  Doing very well POD1.    Rexene Alberts, MD 08/24/2017

## 2017-08-24 NOTE — Progress Notes (Signed)
EVENING ROUNDS NOTE :     [address]       [place] [address]             [phone]                 1 Day Post-Op Procedure(s) (LRB): AORTIC VALVE REPLACEMENT (AVR) USING INSPIRIS RESILIA AORTIC VALVE SIZE 25 MM (N/A) THORACIC ASCENDING ANEURYSM REPAIR (AAA) USING HEMASHIELD GOLD KNITTED MICROVEL DOUBLE VELOUR VASCULAR GRAFT D: 24 MM  L: 30 CM (N/A) TRANSESOPHAGEAL ECHOCARDIOGRAM (TEE) (N/A)  Patient sleeping on CPAP. Total Length of Stay:  LOS: 1 day  BP 126/79   Pulse 70   Temp 98.4 F (36.9 C) (Oral)   Resp (!) 21   Ht 5' 7.01" (1.702 m)   Wt (!) 427 lb 3.2 oz (193.8 kg)   SpO2 91%   BMI 66.89 kg/m   .Intake/Output      04/05 0701 - 04/06 0700   P.O. 720   I.V. (mL/kg)    IV Piggyback 200   Total Intake(mL/kg) 920 (4.7)   Urine (mL/kg/hr) 400 (0.1)   Emesis/NG output    Blood    Chest Tube 250- no air leak  Total Output 650   Net +270         . sodium chloride 250 mL (08/24/17 0550)  .  ceFAZolin (ANCEF) IV Stopped (08/24/17 1822)  . lactated ringers    . lactated ringers       Lab Results  Component Value Date   WBC 18.0 (H) 08/24/2017   HGB 12.2 (L) 08/24/2017   HCT 36.0 (L) 08/24/2017   PLT 178 08/24/2017   GLUCOSE 106 (H) 08/24/2017   ALT 25 08/21/2017   AST 29 08/21/2017   NA 138 08/24/2017   K 3.8 08/24/2017   CL 99 (L) 08/24/2017   CREATININE 0.90 08/24/2017   BUN 23 (H) 08/24/2017   CO2 24 08/24/2017   INR 1.23 08/23/2017   HGBA1C 5.4 08/21/2017  Supplement potassium Continue present management  Lars Pinks PA-C 08/24/2017 9:55 PM

## 2017-08-25 ENCOUNTER — Inpatient Hospital Stay (HOSPITAL_COMMUNITY): Payer: BLUE CROSS/BLUE SHIELD

## 2017-08-25 LAB — BASIC METABOLIC PANEL
Anion gap: 10 (ref 5–15)
BUN: 24 mg/dL — AB (ref 6–20)
CO2: 26 mmol/L (ref 22–32)
Calcium: 8 mg/dL — ABNORMAL LOW (ref 8.9–10.3)
Chloride: 98 mmol/L — ABNORMAL LOW (ref 101–111)
Creatinine, Ser: 0.97 mg/dL (ref 0.61–1.24)
GFR calc Af Amer: >60 mL/min (ref >60)
Glucose, Bld: 124 mg/dL — ABNORMAL HIGH (ref 65–99)
Potassium: 3.9 mmol/L (ref 3.5–5.1)
SODIUM: 134 mmol/L — AB (ref 135–145)

## 2017-08-25 LAB — GLUCOSE, CAPILLARY
GLUCOSE-CAPILLARY: 125 mg/dL — AB (ref 65–99)
GLUCOSE-CAPILLARY: 132 mg/dL — AB (ref 65–99)
GLUCOSE-CAPILLARY: 159 mg/dL — AB (ref 65–99)
GLUCOSE-CAPILLARY: 126 mg/dL — AB (ref 65–99)
Glucose-Capillary: 141 mg/dL — ABNORMAL HIGH (ref 65–99)

## 2017-08-25 LAB — CBC
HCT: 37.4 % — ABNORMAL LOW (ref 39.0–52.0)
Hemoglobin: 12.1 g/dL — ABNORMAL LOW (ref 13.0–17.0)
MCH: 27.3 pg (ref 26.0–34.0)
MCHC: 32.4 g/dL (ref 30.0–36.0)
MCV: 84.2 fL (ref 78.0–100.0)
Platelets: 162 10*3/uL (ref 150–400)
RBC: 4.44 MIL/uL (ref 4.22–5.81)
RDW: 16.4 % — ABNORMAL HIGH (ref 11.5–15.5)
WBC: 14.8 10*3/uL — ABNORMAL HIGH (ref 4.0–10.5)

## 2017-08-25 MED ORDER — SODIUM CHLORIDE 0.9% FLUSH
10.0000 mL | Freq: Two times a day (BID) | INTRAVENOUS | Status: DC
  Administered 2017-08-25 – 2017-08-27 (×7): 10 mL
  Administered 2017-08-28: 20 mL

## 2017-08-25 MED ORDER — METOPROLOL TARTRATE 12.5 MG HALF TABLET
12.5000 mg | ORAL_TABLET | Freq: Two times a day (BID) | ORAL | Status: DC
  Administered 2017-08-25 – 2017-08-26 (×2): 12.5 mg via ORAL
  Filled 2017-08-25 (×2): qty 1

## 2017-08-25 MED ORDER — FUROSEMIDE 10 MG/ML IJ SOLN
40.0000 mg | Freq: Two times a day (BID) | INTRAMUSCULAR | Status: DC
  Administered 2017-08-25 – 2017-08-27 (×4): 40 mg via INTRAVENOUS
  Filled 2017-08-25 (×4): qty 4

## 2017-08-25 MED ORDER — AMIODARONE HCL IN DEXTROSE 360-4.14 MG/200ML-% IV SOLN
30.0000 mg/h | INTRAVENOUS | Status: DC
  Administered 2017-08-26 (×2): 30 mg/h via INTRAVENOUS
  Filled 2017-08-25 (×2): qty 200

## 2017-08-25 MED ORDER — INSULIN ASPART 100 UNIT/ML ~~LOC~~ SOLN
0.0000 [IU] | Freq: Three times a day (TID) | SUBCUTANEOUS | Status: DC
  Administered 2017-08-25: 3 [IU] via SUBCUTANEOUS
  Administered 2017-08-25 – 2017-08-28 (×3): 2 [IU] via SUBCUTANEOUS

## 2017-08-25 MED ORDER — CHLORHEXIDINE GLUCONATE CLOTH 2 % EX PADS
6.0000 | MEDICATED_PAD | Freq: Every day | CUTANEOUS | Status: DC
  Administered 2017-08-25 – 2017-09-01 (×5): 6 via TOPICAL

## 2017-08-25 MED ORDER — AMIODARONE HCL IN DEXTROSE 360-4.14 MG/200ML-% IV SOLN
60.0000 mg/h | INTRAVENOUS | Status: AC
  Administered 2017-08-25 (×2): 60 mg/h via INTRAVENOUS
  Filled 2017-08-25 (×2): qty 200

## 2017-08-25 MED ORDER — SODIUM CHLORIDE 0.9% FLUSH: 10.0000 mL | INTRAVENOUS | Status: DC | PRN

## 2017-08-25 NOTE — Progress Notes (Signed)
  Amiodarone Drug - Drug Interaction Consult Note  Recommendations: 1. Monitor for QTc prolongation with concurrent use of Celexa with Amio - QTc noted to be 458 on 4/5 2.  Monitor for bradycardia with concurrent metoprolol 3. Please ensure potassium is corrected appropriately while on diuretics and amidarone.   Amiodarone is metabolized by the cytochrome P450 system and therefore has the potential to cause many drug interactions. Amiodarone has an average plasma half-life of 50 days (range 20 to 100 days).   There is potential for drug interactions to occur several weeks or months after stopping treatment and the onset of drug interactions may be slow after initiating amiodarone.   []  Statins: Increased risk of myopathy. Simvastatin- restrict dose to 20mg  daily. Other statins: counsel patients to report any muscle pain or weakness immediately.  []  Anticoagulants: Amiodarone can increase anticoagulant effect. Consider warfarin dose reduction. Patients should be monitored closely and the dose of anticoagulant altered accordingly, remembering that amiodarone levels take several weeks to stabilize.  []  Antiepileptics: Amiodarone can increase plasma concentration of phenytoin, the dose should be reduced. Note that small changes in phenytoin dose can result in large changes in levels. Monitor patient and counsel on signs of toxicity.  [x]  Beta blockers: increased risk of bradycardia, AV block and myocardial depression. Sotalol - avoid concomitant use.  []   Calcium channel blockers (diltiazem and verapamil): increased risk of bradycardia, AV block and myocardial depression.  []   Cyclosporine: Amiodarone increases levels of cyclosporine. Reduced dose of cyclosporine is recommended.  []  Digoxin dose should be halved when amiodarone is started.  [x]  Diuretics: increased risk of cardiotoxicity if hypokalemia occurs.  []  Oral hypoglycemic agents (glyburide, glipizide, glimepiride): increased risk of  hypoglycemia. Patient's glucose levels should be monitored closely when initiating amiodarone therapy.   [x]  Drugs that prolong the QT interval:  Torsades de pointes risk may be increased with concurrent use - avoid if possible.  Monitor QTc, also keep magnesium/potassium WNL if concurrent therapy can't be avoided. Marland Kitchen Antibiotics: e.g. fluoroquinolones, erythromycin. . Antiarrhythmics: e.g. quinidine, procainamide, disopyramide, sotalol. . Antipsychotics: e.g. phenothiazines, haloperidol.  . Lithium, tricyclic antidepressants, and methadone. Thank You,   Alycia Rossetti, PharmD, BCPS Pager: [phone] 7:00 PM

## 2017-08-25 NOTE — Progress Notes (Signed)
PT Cancellation Note  Patient Details Name: Reginald [name] MRN: [id number] DOB: [birth year]   Cancelled Treatment:    Reason Eval/Treat Not Completed: Medical issues which prohibited therapy(Pt bradycardic, had to place back on pacemaker per nurse. HOLD today)   Denice Paradise 08/25/2017, 11:52 AM  [facility] [phone] (pager)

## 2017-08-25 NOTE — Progress Notes (Signed)
      [address]       [place] [address]             [phone]      POD # 2 AVR, ascending aneurysm repair  Up in chair  BP 115/86   Pulse 89   Temp 98.1 F (36.7 C) (Oral)   Resp (!) 26   Ht 5' 7.01" (1.702 m)   Wt (!) 433 lb 3.3 oz (196.5 kg)   SpO2 96%   BMI 67.83 kg/m  Currently in atrial fib with controlled ventricular response rate 85-104  Intake/Output Summary (Last 24 hours) at 08/25/2017 1845 Last data filed at 08/25/2017 1730 Gross per 24 hour  Intake 700 ml  Output 1790 ml  Net -1090 ml   No PM labs Continue diuresis Will start amiodarone without a bolus  Remo Lipps C. Roxan Hockey, MD [facility] [phone]

## 2017-08-25 NOTE — Progress Notes (Signed)
2 Days Post-Op Procedure(s) (LRB): AORTIC VALVE REPLACEMENT (AVR) USING INSPIRIS RESILIA AORTIC VALVE SIZE 25 MM (N/A) THORACIC ASCENDING ANEURYSM REPAIR (AAA) USING HEMASHIELD GOLD KNITTED MICROVEL DOUBLE VELOUR VASCULAR GRAFT D: 24 MM  L: 30 CM (N/A) TRANSESOPHAGEAL ECHOCARDIOGRAM (TEE) (N/A) Subjective: Became light headed and diaphoretic after going to bathroom  Objective: Vital signs in last 24 hours: Temp:  [97.5 F (36.4 C)-98.6 F (37 C)] 97.5 F (36.4 C) (04/06 0751) Pulse Rate:  [61-73] 64 (04/06 0900) Cardiac Rhythm: Normal sinus rhythm (04/06 0800) Resp:  [0-27] 21 (04/06 0900) BP: (103-136)/(60-84) 119/68 (04/06 0900) SpO2:  [91 %-97 %] 97 % (04/06 0900) Weight:  [433 lb 3.3 oz (196.5 kg)] 433 lb 3.3 oz (196.5 kg) (04/06 0600)  Hemodynamic parameters for last 24 hours:    Intake/Output from previous day: 04/05 0701 - 04/06 0700 In: 1040 [P.O.:720; IV Piggyback:200] Out: 1310 [Urine:850; Chest Tube:460] Intake/Output this shift: Total I/O In: 100 [IV Piggyback:100] Out: 0   General appearance: alert, cooperative and diaphoretic Neurologic: intact Heart: regular rate and rhythm Lungs: diminished breath sounds bibasilar Abdomen: normal findings: soft, non-tender  Lab Results: Recent Labs    08/24/17 1812 08/24/17 1828 08/25/17 0610  WBC 20.7*  --  14.8*  HGB 12.4* 12.2* 12.1*  HCT 38.8* 36.0* 37.4*  PLT 169  --  162   BMET:  Recent Labs    08/24/17 0323  08/24/17 1828 08/25/17 0354  NA 138  --  138 134*  K 4.2  --  3.8 3.9  CL 104  --  99* 98*  CO2 24  --   --  26  GLUCOSE 124*  --  106* 124*  BUN 19  --  23* 24*  CREATININE 0.87   < > 0.90 0.97  CALCIUM 8.2*  --   --  8.0*   < > = values in this interval not displayed.    PT/INR:  Recent Labs    08/23/17 1413  LABPROT 15.4*  INR 1.23   ABG    Component Value Date/Time   PHART 7.436 08/24/2017 0332   HCO3 27.0 08/24/2017 0332   TCO2 26 08/24/2017 1828   ACIDBASEDEF 2.0 08/23/2017  1858   O2SAT 93.0 08/24/2017 0332   CBG (last 3)  Recent Labs    08/24/17 2343 08/25/17 0335 08/25/17 0754  GLUCAP 110* 125* 132*    Assessment/Plan: S/P Procedure(s) (LRB): AORTIC VALVE REPLACEMENT (AVR) USING INSPIRIS RESILIA AORTIC VALVE SIZE 25 MM (N/A) THORACIC ASCENDING ANEURYSM REPAIR (AAA) USING HEMASHIELD GOLD KNITTED MICROVEL DOUBLE VELOUR VASCULAR GRAFT D: 24 MM  L: 30 CM (N/A) TRANSESOPHAGEAL ECHOCARDIOGRAM (TEE) (N/A) -CV- bradycardic and hypotensive after going to bathroom- probable vagal event  Currently DDD paced at 88 with good BP  Hold lopressor this AM  RESP- continue IS  RENAL- creatinine and lytes OK  Volume overloaded- diurese  ENDO- CBG well controlled- change from q4 to AC/HS  Continue to mobilize as tolerated  SCD + enoxaparin for DVT prophylaxis   LOS: 2 days    Melrose Nakayama 08/25/2017

## 2017-08-26 DIAGNOSIS — I4891 Unspecified atrial fibrillation: Secondary | ICD-10-CM

## 2017-08-26 LAB — BASIC METABOLIC PANEL
ANION GAP: 11 (ref 5–15)
BUN: 18 mg/dL (ref 6–20)
CALCIUM: 8.3 mg/dL — AB (ref 8.9–10.3)
CO2: 27 mmol/L (ref 22–32)
CREATININE: 0.77 mg/dL (ref 0.61–1.24)
Chloride: 99 mmol/L — ABNORMAL LOW (ref 101–111)
Glucose, Bld: 114 mg/dL — ABNORMAL HIGH (ref 65–99)
Potassium: 3.4 mmol/L — ABNORMAL LOW (ref 3.5–5.1)
SODIUM: 137 mmol/L (ref 135–145)

## 2017-08-26 LAB — CBC
HCT: 35 % — ABNORMAL LOW (ref 39.0–52.0)
Hemoglobin: 11.4 g/dL — ABNORMAL LOW (ref 13.0–17.0)
MCH: 27.1 pg (ref 26.0–34.0)
MCHC: 32.6 g/dL (ref 30.0–36.0)
MCV: 83.1 fL (ref 78.0–100.0)
PLATELETS: 169 10*3/uL (ref 150–400)
RBC: 4.21 MIL/uL — ABNORMAL LOW (ref 4.22–5.81)
RDW: 15.8 % — AB (ref 11.5–15.5)
WBC: 13 10*3/uL — ABNORMAL HIGH (ref 4.0–10.5)

## 2017-08-26 LAB — GLUCOSE, CAPILLARY
Glucose-Capillary: 117 mg/dL — ABNORMAL HIGH (ref 65–99)
Glucose-Capillary: 108 mg/dL — ABNORMAL HIGH (ref 65–99)
Glucose-Capillary: 90 mg/dL (ref 65–99)
Glucose-Capillary: 100 mg/dL — ABNORMAL HIGH (ref 65–99)

## 2017-08-26 LAB — ECHO INTRAOPERATIVE TEE

## 2017-08-26 MED ORDER — POTASSIUM CHLORIDE 10 MEQ/50ML IV SOLN
10.0000 meq | INTRAVENOUS | Status: AC
  Administered 2017-08-26 (×3): 10 meq via INTRAVENOUS
  Filled 2017-08-26: qty 50

## 2017-08-26 MED ORDER — POTASSIUM CHLORIDE 10 MEQ/50ML IV SOLN
10.0000 meq | INTRAVENOUS | Status: AC
  Administered 2017-08-26 (×3): 10 meq via INTRAVENOUS
  Filled 2017-08-26 (×2): qty 50

## 2017-08-26 NOTE — Plan of Care (Signed)
  Problem: Elimination: Goal: Will not experience complications related to bowel motility Outcome: Progressing Note:  Patient is passing gas but no BM since surgery.    Problem: Activity: Goal: Risk for activity intolerance will decrease Outcome: Not Progressing Note:  Patient becomes very SOB with minimal exertion.  Patient's oxygen saturations maintain 90% or greater but patient becomes tachypnic and feels very short of breath.  Instructed patient to take slow breathes in through his nose and out the mouth.

## 2017-08-26 NOTE — Progress Notes (Signed)
3 Days Post-Op Procedure(s) (LRB): AORTIC VALVE REPLACEMENT (AVR) USING INSPIRIS RESILIA AORTIC VALVE SIZE 25 MM (N/A) THORACIC ASCENDING ANEURYSM REPAIR (AAA) USING HEMASHIELD GOLD KNITTED MICROVEL DOUBLE VELOUR VASCULAR GRAFT D: 24 MM  L: 30 CM (N/A) TRANSESOPHAGEAL ECHOCARDIOGRAM (TEE) (N/A) Subjective: I feel tired  Objective: Vital signs in last 24 hours: Temp:  [97.7 F (36.5 C)-98.9 F (37.2 C)] 98.6 F (37 C) (04/07 0818) Pulse Rate:  [66-95] 71 (04/07 0700) Cardiac Rhythm: Ventricular paced (04/07 0500) Resp:  [17-30] 22 (04/07 0700) BP: (93-122)/(55-99) 121/87 (04/07 0700) SpO2:  [91 %-97 %] 94 % (04/07 0700) Weight:  [430 lb 12.5 oz (195.4 kg)] 430 lb 12.5 oz (195.4 kg) (04/07 0600)  Hemodynamic parameters for last 24 hours:    Intake/Output from previous day: 04/06 0701 - 04/07 0700 In: 813.4 [P.O.:480; I.V.:233.4; IV Piggyback:100] Out: 1930 [Urine:1850; Chest Tube:80] Intake/Output this shift: Total I/O In: -  Out: 200 [Urine:200]  General appearance: alert, cooperative and no distress Neurologic: intact Heart: irregularly irregular rhythm Lungs: diminished breath sounds bibasilar Abdomen: normal findings: soft, non-tender  Lab Results: Recent Labs    08/25/17 0610 08/26/17 0445  WBC 14.8* 13.0*  HGB 12.1* 11.4*  HCT 37.4* 35.0*  PLT 162 169   BMET:  Recent Labs    08/25/17 0354 08/26/17 0445  NA 134* 137  K 3.9 3.4*  CL 98* 99*  CO2 26 27  GLUCOSE 124* 114*  BUN 24* 18  CREATININE 0.97 0.77  CALCIUM 8.0* 8.3*    PT/INR:  Recent Labs    08/23/17 1413  LABPROT 15.4*  INR 1.23   ABG    Component Value Date/Time   PHART 7.436 08/24/2017 0332   HCO3 27.0 08/24/2017 0332   TCO2 26 08/24/2017 1828   ACIDBASEDEF 2.0 08/23/2017 1858   O2SAT 93.0 08/24/2017 0332   CBG (last 3)  Recent Labs    08/25/17 1550 08/25/17 2107 08/26/17 0817  GLUCAP 159* 126* 117*    Assessment/Plan: S/P Procedure(s) (LRB): AORTIC VALVE REPLACEMENT  (AVR) USING INSPIRIS RESILIA AORTIC VALVE SIZE 25 MM (N/A) THORACIC ASCENDING ANEURYSM REPAIR (AAA) USING HEMASHIELD GOLD KNITTED MICROVEL DOUBLE VELOUR VASCULAR GRAFT D: 24 MM  L: 30 CM (N/A) TRANSESOPHAGEAL ECHOCARDIOGRAM (TEE) (N/A) -CV- still in atrial fib underlying rhythm. Relatively long periods of block so still on VVI  Will ask Cardiology to see  RESP- continue IS  RENAL- creatinine ok, continue diuresis  Hypokalemia- supplement  ENDO_ CBG well controlled  Mobilize as tolerated   LOS: 3 days    Reginald [name] 08/26/2017

## 2017-08-26 NOTE — Anesthesia Postprocedure Evaluation (Signed)
Anesthesia Post Note  Patient: Reginald [name]  Procedure(s) Performed: AORTIC VALVE REPLACEMENT (AVR) USING INSPIRIS RESILIA AORTIC VALVE SIZE 25 MM (N/A Chest) THORACIC ASCENDING ANEURYSM REPAIR (AAA) USING HEMASHIELD GOLD KNITTED MICROVEL DOUBLE VELOUR VASCULAR GRAFT D: 24 MM  L: 30 CM (N/A ) TRANSESOPHAGEAL ECHOCARDIOGRAM (TEE) (N/A )     Patient location during evaluation: SICU Anesthesia Type: General Level of consciousness: sedated Pain management: pain level controlled Vital Signs Assessment: post-procedure vital signs reviewed and stable Respiratory status: patient remains intubated per anesthesia plan Cardiovascular status: stable Postop Assessment: no apparent nausea or vomiting Anesthetic complications: no    Last Vitals:  Vitals:   08/26/17 0600 08/26/17 0700  BP:  121/87  Pulse:  71  Resp: (!) 30 (!) 22  Temp:    SpO2:  94%    Last Pain:  Vitals:   08/26/17 0730  TempSrc:   PainSc: 3    Pain Goal: Patients Stated Pain Goal: 2 (08/24/17 1600)               Murphy

## 2017-08-26 NOTE — Consult Note (Signed)
Cardiology Consultation:   Patient ID: Reginald [name]; [id number]; 1971/04/22   Admit date: 08/23/2017 Date of Consult: 08/26/2017  Primary Care Provider: Monico Blitz, MD Primary Cardiologist: Reginald [name] Primary Electrophysiologist:  none   Patient Profile:   Reginald [name] is a 47 y.o. male with a hx of aortic stenosis, hypertension, tobacco abuse, morbid obesity who is being seen today for the evaluation of atrial fibrillation at the request of Reginald [name].  History of Present Illness:   Reginald [name] is a 47 year old male with a past history of hypertension, chronic diastolic heart failure, COPD, morbid obesity, pulmonary hypertension, and aortic stenosis status post aortic valve replacement.  Postop, he is developed right bundle mesh block.  He is also in atrial fibrillation with intermittent ventricular pacing from his temporary pacing wires.  He is currently postop day 3.  Amiodarone was started yesterday.  Since going into atrial fibrillation, he is required no ventricular pacing.  It appears in atrial fibrillation started some point 4/6.  Today, his only complaint is of severe weakness.  Past Medical History:  Diagnosis Date  . Anxiety   . Aortic stenosis   . Aortic stenosis   . Asthma   . Back pain   . Cellulitis of skin with lymphangitis   . CHF (congestive heart failure) ([place])   . Chronic diastolic congestive heart failure ([place])   . Chronic venous insufficiency   . Constipation   . COPD (chronic obstructive pulmonary disease) ([place])   . Dyspnea   . Essential hypertension   . Gout   . Hypertension   . Morbid obesity ([place])   . Obesity   . Pneumonia    walking pneumonia  . Prostatitis   . Pulmonary hypertension ([place])   . S/P aortic valve replacement with bioprosthetic valve 08/23/2017   25 mm Edwards Inspiris Resilia stented bovine pericardial tissue valve  . 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 Sleep apnea      cpap   . Thoracic ascending aortic aneurysm ([place])   . Thoracic ascending aortic aneurysm ([place])   . Tobacco abuse     Past Surgical History:  Procedure Laterality Date  . AORTIC VALVE REPLACEMENT N/A 08/23/2017   Procedure: AORTIC VALVE REPLACEMENT (AVR) USING INSPIRIS RESILIA AORTIC VALVE SIZE 25 MM;  Surgeon: Rexene Alberts, MD;  Location: [place];  Service: Open Heart Surgery;  Laterality: N/A;  . MULTIPLE EXTRACTIONS WITH ALVEOLOPLASTY N/A 07/23/2017   Procedure: Extraction of tooth #10 with alveoloplasty and gross debridement of remaining teeth;  Surgeon: Lenn Cal, DDS;  Location: [place];  Service: Oral Surgery;  Laterality: N/A;  . NO PAST SURGERIES    . TEE WITHOUT CARDIOVERSION N/A 07/16/2017   Procedure: TRANSESOPHAGEAL ECHOCARDIOGRAM (TEE);  Surgeon: Lelon Perla, MD;  Location[facility] ENDOSCOPY;  Service: Cardiovascular;  Laterality: N/A;  . TEE WITHOUT CARDIOVERSION N/A 08/23/2017   Procedure: TRANSESOPHAGEAL ECHOCARDIOGRAM (TEE);  Surgeon: Rexene Alberts, MD;  Location: [place];  Service: Open Heart Surgery;  Laterality: N/A;  . THORACIC AORTIC ANEURYSM REPAIR N/A 08/23/2017   Procedure: THORACIC ASCENDING ANEURYSM REPAIR (AAA) USING HEMASHIELD GOLD KNITTED MICROVEL DOUBLE VELOUR VASCULAR GRAFT D: 24 MM  L: 30 CM;  Surgeon: Rexene Alberts, MD;  Location: [place];  Service: Open Heart Surgery;  Laterality: N/A;     Home Medications:  Prior to Admission medications   Medication Sig Start Date End Date Taking? Authorizing Provider  albuterol (  PROVENTIL HFA;VENTOLIN HFA) 108 (90 Base) MCG/ACT inhaler Inhale 2 puffs into the lungs every 6 (six) hours as needed for wheezing or shortness of breath.    Yes [provider]  allopurinol (ZYLOPRIM) 300 MG tablet Take 300 mg by mouth daily.   Yes [provider]  amLODipine (NORVASC) 10 MG tablet Take 10 mg by mouth daily.   Yes [provider]  aspirin EC 81 MG tablet Take 81 mg by mouth once.   Yes  [provider]  chlorhexidine (PERIDEX) 0.12 % solution Rinse with 15 mls twice daily for 30 seconds. Use after breakfast and at bedtime. Spit out excess. Do not swallow. 07/18/17  Yes Lenn Cal, DDS  citalopram (CELEXA) 20 MG tablet Take 20 mg by mouth at bedtime. 08/12/17  Yes [provider]  clonazePAM (KLONOPIN) 1 MG tablet Take 0.5-1 mg by mouth every 8 (eight) hours as needed for anxiety.   Yes [provider]  docusate sodium (COLACE) 100 MG capsule Take 200 mg by mouth daily.    Yes [provider]  furosemide (LASIX) 40 MG tablet Take 1 tablet (40 mg total) by mouth daily. MAY TAKE AN EXTRA 40 MG AS NEEDED Patient taking differently: Take 80 mg by mouth daily. MAY TAKE AN EXTRA 40 MG AS NEEDED 06/19/17  Yes Herminio Commons, MD  ibuprofen (ADVIL,MOTRIN) 800 MG tablet Take 800 mg by mouth 2 (two) times daily as needed for headache or moderate pain.    Yes [provider]  ipratropium (ATROVENT) 0.02 % nebulizer solution Take 0.5 mg by nebulization every 6 (six) hours as needed for wheezing or shortness of breath.   Yes [provider]  lisinopril-hydrochlorothiazide (PRINZIDE,ZESTORETIC) 20-25 MG tablet Take 1 tablet by mouth daily.   Yes [provider]  potassium chloride (K-DUR,KLOR-CON) 10 MEQ tablet Take 10 mEq by mouth daily.   Yes [provider]  Probiotic Product (PROBIOTIC PO) Take 1 capsule by mouth at bedtime.   Yes [provider]  HYDROcodone-acetaminophen (NORCO) 5-325 MG tablet Take 1-2 tablets by mouth every 6 (six) hours as needed for moderate pain or severe pain. Patient not taking: Reported on 08/14/2017 07/23/17   Lenn Cal, DDS    Inpatient Medications: Scheduled Meds: . acetaminophen  1,000 mg Oral Q6H  . allopurinol  300 mg Oral Daily  . aspirin EC  325 mg Oral Daily  . bisacodyl  10 mg Oral Daily   Or  . bisacodyl  10 mg Rectal Daily  . Chlorhexidine Gluconate  Cloth  6 each Topical Daily  . citalopram  20 mg Oral QHS  . docusate sodium  200 mg Oral Daily  . enoxaparin (LOVENOX) injection  40 mg Subcutaneous QHS  . furosemide  40 mg Intravenous BID  . insulin aspart  0-15 Units Subcutaneous TID WC  . mouth rinse  15 mL Mouth Rinse BID  . metoprolol tartrate  12.5 mg Oral BID  . pantoprazole  40 mg Oral Daily  . sodium chloride flush  10-40 mL Intracatheter Q12H  . sodium chloride flush  3 mL Intravenous Q12H   Continuous Infusions: . sodium chloride 250 mL (08/24/17 0550)  . amiodarone 30 mg/hr (08/26/17 0951)  . lactated ringers    . lactated ringers    . potassium chloride     PRN Meds: clonazePAM, metoprolol tartrate, morphine injection, ondansetron (ZOFRAN) IV, oxyCODONE, sodium chloride flush, sodium chloride flush, traMADol  Allergies:   No Known Allergies  Social History:   Social History   Socioeconomic History  . Marital status: Single    Spouse name: Not on file  . Number of children: Not on file  . Years of education: Not on file  . Highest education level: Not on file  Occupational History  . Not on file  Social Needs  . Financial resource strain: Not on file  . Food insecurity:    Worry: Not on file    Inability: Not on file  . Transportation needs:    Medical: Not on file    Non-medical: Not on file  Tobacco Use  . Smoking status: Current Every Day Smoker    Packs/day: 0.50    Types: Cigarettes  . Smokeless tobacco: Never Used  Substance and Sexual Activity  . Alcohol use: No    Frequency: Never    Comment: have had alcohol in the past, not heavy  . Drug use: No  . Sexual activity: Not on file  Lifestyle  . Physical activity:    Days per week: Not on file    Minutes per session: Not on file  . Stress: Not on file  Relationships  . Social connections:    Talks on phone: Not on file    Gets together: Not on file    Attends religious service: Not on file    Active member of club or organization: Not  on file    Attends meetings of clubs or organizations: Not on file    Relationship status: Not on file  . Intimate partner violence:    Fear of current or ex partner: Not on file    Emotionally abused: Not on file    Physically abused: Not on file    Forced sexual activity: Not on file  Other Topics Concern  . Not on file  Social History Narrative  . Not on file    Family History:    Family History  Problem Relation Age of Onset  . Hypertension Mother   . Alzheimer's disease Mother   . Heart attack Brother   . Diabetes Brother      ROS:  Please see the history of present illness.   All other ROS reviewed and negative.     Physical Exam/Data:   Vitals:   08/26/17 0500 08/26/17 0600 08/26/17 0700 08/26/17 0818  BP: 106/70  121/87   Pulse: 67  71   Resp: (!) 24 (!) 30 (!) 22   Temp:    98.6 F (37 C)  TempSrc:    Oral  SpO2: 94%  94%   Weight:  (!) 430 lb 12.5 oz (195.4 kg)    Height:        Intake/Output Summary (Last 24 hours) at 08/26/2017 1146 Last data filed at 08/26/2017 1000 Gross per 24 hour  Intake 233.4 ml  Output 2070 ml  Net -1836.6 ml   Filed Weights   08/24/17 0500 08/25/17 0600 08/26/17 0600  Weight: (!) 427 lb 3.2 oz (193.8 kg) (!) 433 lb 3.3 oz (196.5 kg) (!) 430 lb 12.5 oz (195.4 kg)   Body mass index is 67.45 kg/m.  General:  Well nourished, well developed, in no acute distress HEENT: normal Lymph: no adenopathy Neck: no JVD Endocrine:  No thryomegaly Vascular: No carotid bruits; FA pulses 2+ bilaterally without bruits  Cardiac:  normal S1, S2; irregular rhythm; no murmur  Lungs:  clear to auscultation bilaterally, no wheezing, rhonchi or rales  Abd: soft, nontender, no hepatomegaly  Ext:  no edema Musculoskeletal:  No deformities, BUE and BLE strength normal and equal Skin: warm and dry  Neuro:  CNs 2-12 intact, no focal abnormalities noted Psych:  Normal affect   EKG:  The EKG was personally reviewed and demonstrates: Sinus rhythm,  right bundle branch block atrial fibrillation with intermittent ventricular pacing Telemetry:  Telemetry was personally reviewed and demonstrates: Atrial fibrillation with intermittent ventricular pacing   Relevant CV Studies: TTE 05/25/17  - Left ventricle: The cavity size was normal. Wall thickness was   increased in a pattern of moderate LVH. Systolic function was   normal. The estimated ejection fraction was in the range of 60%   to 65%. Wall motion was normal; there were no regional wall   motion abnormalities. Left ventricular diastolic function   parameters were normal. - Aortic valve: There was severe stenosis. Mean gradient (S): 48 mm   Hg. Valve area (VTI): 0.99 cm^2. Valve area (Vmax): 0.98 cm^2.   Valve area (Vmean): 0.88 cm^2. - Aorta: The visualized portion of the proximal ascending aorts is   severely dilated at 5.2 cm. Recommend CTA or MRA to better   evaluate.  Laboratory Data:  Chemistry Recent Labs  Lab 08/24/17 0323 08/24/17 1812 08/24/17 1828 08/25/17 0354 08/26/17 0445  NA 138  --  138 134* 137  K 4.2  --  3.8 3.9 3.4*  CL 104  --  99* 98* 99*  CO2 24  --   --  26 27  GLUCOSE 124*  --  106* 124* 114*  BUN 19  --  23* 24* 18  CREATININE 0.87 0.99 0.90 0.97 0.77  CALCIUM 8.2*  --   --  8.0* 8.3*  GFRNONAA >60 >60  --  >60 >60  GFRAA >60 >60  --  >60 >60  ANIONGAP 10  --   --  10 11    Recent Labs  Lab 08/21/17 1048  PROT 7.6  ALBUMIN 3.7  AST 29  ALT 25  ALKPHOS 79  BILITOT 0.5   Hematology Recent Labs  Lab 08/24/17 1812 08/24/17 1828 08/25/17 0610 08/26/17 0445  WBC 20.7*  --  14.8* 13.0*  RBC 4.59  --  4.44 4.21*  HGB 12.4* 12.2* 12.1* 11.4*  HCT 38.8* 36.0* 37.4* 35.0*  MCV 84.5  --  84.2 83.1  MCH 27.0  --  27.3 27.1  MCHC 32.0  --  32.4 32.6  RDW 15.9*  --  16.4* 15.8*  PLT 169  --  162 169   Cardiac EnzymesNo results for input(s): TROPONINI in the last 168 hours. No results for input(s): TROPIPOC in the last 168 hours.   BNPNo results for input(s): BNP, PROBNP in the last 168 hours.  DDimer No results for input(s): DDIMER in the last 168 hours.  Radiology/Studies:  Dg Chest Port 1 View  Result Date: 08/25/2017 CLINICAL DATA:  Aortic valve replacement EXAM: PORTABLE CHEST 1 VIEW COMPARISON:  08/25/2017 FINDINGS: 1208 hours. The cardio pericardial silhouette is enlarged. Stable asymmetric elevation right hemidiaphragm. Basilar atelectasis again noted bilaterally. Vascular congestion without overt edema. No substantial pleural effusion. Midline mediastinal/pericardial drain again noted. Right IJ sheath remains in place. IMPRESSION: 1. No substantial change. 2. Cardiomegaly with vascular congestion and basilar atelectasis. Electronically Signed   By: Misty Stanley M.D.   On: 08/25/2017 15:11   Dg Chest Port 1 View  Result Date: 08/25/2017 CLINICAL DATA:  Atelectasis EXAM: PORTABLE CHEST 1 VIEW COMPARISON:  08/24/2017 FINDINGS: Swan-Ganz catheter removed. Sheath remains in  place. Mediastinal drain is in place unchanged. No pneumothorax Cardiac enlargement with vascular congestion which appears slightly improved. Right lower lobe atelectasis unchanged. IMPRESSION: Swan-Ganz catheter removed. Improvement in vascular congestion. Right lower lobe atelectasis unchanged. Electronically Signed   By: Franchot Gallo M.D.   On: 08/25/2017 09:04   Dg Chest Port 1 View  Result Date: 08/24/2017 CLINICAL DATA:  Post AVR.  History of asthma, CHF and COPD. EXAM: PORTABLE CHEST 1 VIEW COMPARISON:  08/23/2017; 02/06/2017; chest CT-07/26/2017 FINDINGS: Grossly unchanged enlarged cardiac silhouette and mediastinal contours post median sternotomy and aortic valve replacement. Interval extubation and removal of enteric tube. Otherwise, stable position of remaining support apparatus. No pneumothorax. Pulmonary vasculature remains indistinct with cephalization of flow. Grossly unchanged right basilar heterogeneous opacities. No new focal airspace  opacities. No definite pleural effusion, though note, the right costophrenic angle is excluded from view. No acute osseus abnormalities. IMPRESSION: 1. Interval extubation removal of enteric tube. Otherwise, stable position of remaining support apparatus. No pneumothorax. 2. Similar findings of pulmonary edema and right basilar opacities, likely atelectasis. Electronically Signed   By: Sandi Mariscal M.D.   On: 08/24/2017 08:57   Dg Chest Port 1 View  Result Date: 08/23/2017 CLINICAL DATA:  Status post aortic valve replacement and ascending aortic aneurysm repair today. EXAM: PORTABLE CHEST 1 VIEW COMPARISON:  PA and lateral chest 08/21/2017. FINDINGS: Endotracheal tube is in place with the tip in good position at the level of the clavicular heads. Right IJ approach Swan-Ganz catheter tip is in the proximal right main pulmonary artery. Mediastinal drain is in place. NG tube courses into the stomach and below the inferior margin of the film. There is cardiomegaly and vascular congestion. No pneumothorax or consolidative process. IMPRESSION: Support lines project in good position.  Negative for pneumothorax. Cardiomegaly and vascular congestion. Electronically Signed   By: Inge Rise M.D.   On: 08/23/2017 14:59    Assessment and Plan:   1.  New onset atrial fibrillation: It appears that his atrial fibrillation began on 4/6.  He is having a high burden of ventricular pacing.  This is likely due to a combination of atrial fibrillation, recent valve surgery, IV amiodarone, and p.o. metoprolol.  We Jamisen Duerson stop his metoprolol.  It is possible that if he goes back to its normal rhythm, that he Carlen Rebuck not require any further ventricular pacing.  We Meosha Castanon continue overnight.  He Windel Keziah convert on his own.  He would like to avoid pacemaker implant at all possible.  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  2.  Right bundle branch block: New since surgery.    For questions or updates, please contact [place] Please consult [email or web address] for contact info under Cardiology/STEMI.   Signed, Cambri Plourde Meredith Leeds, MD  08/26/2017 11:46 AM

## 2017-08-26 NOTE — Progress Notes (Signed)
Pt called RT c/o cpap pressure being "too high, not like home". RT verified that cpap was set at 10 cmh20 but pt could not tell this RT what his home settings were. RT encouraged pt to bring home machine in if he wants to have that same pressure. In the meantime, RT placed pt's cpap on auto and bled in 3L to allow pt to put on after lunch with his nap. RN at bedside. RT will continue to monitor.

## 2017-08-26 NOTE — Evaluation (Signed)
Physical Therapy Evaluation Patient Details Name: Reginald [name] MRN: [id number] DOB: December 08, 1970 Today's Date: 08/26/2017   History of Present Illness  47 year old male with a past history of hypertension, chronic diastolic heart failure, COPD, morbid obesity, pulmonary hypertension, and aortic stenosis status post aortic valve replacement.  Postop, he is developed right bundle mesh block.  He is also in atrial fibrillation with intermittent ventricular pacing from his temporary pacing wires.  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 Pt currently requires modAx2 for bed mobility, minAx2 for transfers, and minAx2 for ambulation of 150 feet utilizing an EVA walker. Pt sat up in chair for 6 hours prior to PT session and was limited in his mobility by generalized weakness, and fatigue. Pt encouraged to move a little more often so he is not so fatigued when he does ambulate. PT currently recommends HHPT for improving strength and endurance as well as 24 hour support for safety which he still needs to work out. PT will continue to follow acutely to work on gait, and endurance until d/c.      Follow Up Recommendations Home health PT;Supervision/Assistance - 24 hour    Equipment Recommendations  Rolling walker with 5" wheels(bariatric)    Recommendations for Other Services       Precautions / Restrictions Precautions Precautions: Sternal Restrictions Weight Bearing Restrictions: Yes Other Position/Activity Restrictions: sternal precautions      Mobility  Bed Mobility Overal bed mobility: Needs Assistance Bed Mobility: Sit to Supine       Sit to supine: Mod assist;+2 for physical assistance   General bed mobility comments: modAx2 for trunk and LE management into bed  Transfers Overall transfer level: Needs assistance Equipment used: (EVA walker) Transfers: Sit to/from Stand Sit to Stand: Min assist;+2  safety/equipment         General transfer comment: minA for steadying, good power up with use of sternal pillow   Ambulation/Gait Ambulation/Gait assistance: Min assist;+2 safety/equipment Ambulation Distance (Feet): 150 Feet Assistive device: (EVA Walker) Gait Pattern/deviations: Step-through pattern;Decreased step length - right;Decreased step length - left;Shuffle Gait velocity: slowed Gait velocity interpretation: Below normal speed for age/gender General Gait Details: slow, shuffling gait, no LoB, multiple stops for SoB, vc for keeping eyes open as he walks      Balance Overall balance assessment: Needs assistance Sitting-balance support: Feet supported;No upper extremity supported Sitting balance-Leahy Scale: Fair     Standing balance support: Bilateral upper extremity supported Standing balance-Leahy Scale: Fair Standing balance comment: UE support on EVA walker                              Pertinent Vitals/Pain Pain Assessment: 0-10 Pain Score: 5  Pain Location: shoulder, chest Pain Descriptors / Indicators: Aching Pain Intervention(s): Limited activity within patient's tolerance;Monitored during session;Repositioned    Home Living Family/patient expects to be discharged to:: Private residence Living Arrangements: Other relatives Available Help at Discharge: Family;Available PRN/intermittently(possible brother and wife help) Type of Home: House Home Access: Stairs to enter   Technical brewer of Steps: 1 Home Layout: One level Home Equipment: (lift chair)      Prior Function Level of Independence: Independent         Comments: community ambulator, driver worked as Teaching laboratory technician Extremity Assessment Upper Extremity Assessment: Overall WFL for tasks assessed  Lower Extremity Assessment Lower Extremity Assessment: RLE deficits/detail;LLE deficits/detail RLE Deficits / Details: decrease  ROM due to habitus, strength grossly 4/5 LLE Deficits / Details: decrease ROM due to habitus, strength grossly 4/5       Communication   Communication: No difficulties  Cognition Arousal/Alertness: Awake/alert Behavior During Therapy: WFL for tasks assessed/performed Overall Cognitive Status: Within Functional Limits for tasks assessed                                        General Comments General comments (skin integrity, edema, etc.): prior to activity HR 70 bpm, during activity HR max of 110 bpm, supine in bed after ambulation 68bpm, SaO2 on 2L O2 via nasal cannula above 93%O2 throughout ambulation,  Family present during session.         Assessment/Plan    PT Assessment Patient needs continued PT services  PT Problem List Decreased activity tolerance;Decreased mobility;Cardiopulmonary status limiting activity;Pain;Decreased strength       PT Treatment Interventions DME instruction;Gait training;Functional mobility training;Therapeutic activities;Therapeutic exercise;Balance training;Patient/family education    PT Goals (Current goals can be found in the Care Plan section)  Acute Rehab PT Goals Patient Stated Goal: feel better PT Goal Formulation: With patient Time For Goal Achievement: 09/09/17 Potential to Achieve Goals: Good    Frequency Min 3X/week   Barriers to discharge Decreased caregiver support         AM-PAC PT "6 Clicks" Daily Activity  Outcome Measure Difficulty turning over in bed (including adjusting bedclothes, sheets and blankets)?: Unable Difficulty moving from lying on back to sitting on the side of the bed? : Unable Difficulty sitting down on and standing up from a chair with arms (e.g., wheelchair, bedside commode, etc,.)?: Unable Help needed moving to and from a bed to chair (including a wheelchair)?: A Lot Help needed walking in hospital room?: A Little Help needed climbing 3-5 steps with a railing? : Total 6 Click Score:  9    End of Session Equipment Utilized During Treatment: Gait belt;Oxygen Activity Tolerance: Patient limited by fatigue Patient left: in bed;with call bell/phone within reach;with bed alarm set;with family/visitor present Nurse Communication: Mobility status;Other (comment)(asked for CPAP so he could take a nap) PT Visit Diagnosis: Unsteadiness on feet (R26.81);Other abnormalities of gait and mobility (R26.89);Difficulty in walking, not elsewhere classified (R26.2);Pain Pain - Right/Left: (bilateral ) Pain - part of body: (shoulders and chest)    Time: 3086-5784 PT Time Calculation (min) (ACUTE ONLY): 40 min   Charges:   PT Evaluation $PT Eval Moderate Complexity: 1 Mod PT Treatments $Gait Training: 23-37 mins   PT G Codes:        Reginald [name] PT, DPT Acute Rehabilitation  [phone] Pager [phone]    [facility] 08/26/2017, 12:51 PM

## 2017-08-26 NOTE — Progress Notes (Signed)
      [address]       [place] [address]             [phone]      Up in chair  BP 135/90   Pulse 71   Temp 99.3 F (37.4 C) (Oral)   Resp (!) 26   Ht 5' 7.01" (1.702 m)   Wt (!) 430 lb 12.5 oz (195.4 kg)   SpO2 92%   BMI 67.45 kg/m  Still in atrial fib, V paced due to slow rate Lopressor stopped, still on amiodarone   Intake/Output Summary (Last 24 hours) at 08/26/2017 1838 Last data filed at 08/26/2017 1800 Gross per 24 hour  Intake 1407.1 ml  Output 1450 ml  Net -42.9 ml    Remo Lipps C. Roxan Hockey, MD [facility] [phone]

## 2017-08-27 ENCOUNTER — Inpatient Hospital Stay (HOSPITAL_COMMUNITY): Payer: BLUE CROSS/BLUE SHIELD

## 2017-08-27 ENCOUNTER — Encounter (HOSPITAL_COMMUNITY): Payer: Self-pay | Admitting: Thoracic Surgery (Cardiothoracic Vascular Surgery)

## 2017-08-27 ENCOUNTER — Inpatient Hospital Stay: Payer: Self-pay

## 2017-08-27 DIAGNOSIS — Z953 Presence of xenogenic heart valve: Secondary | ICD-10-CM

## 2017-08-27 DIAGNOSIS — I480 Paroxysmal atrial fibrillation: Secondary | ICD-10-CM

## 2017-08-27 LAB — CBC
HEMATOCRIT: 35.8 % — AB (ref 39.0–52.0)
Hemoglobin: 11.3 g/dL — ABNORMAL LOW (ref 13.0–17.0)
MCH: 26.2 pg (ref 26.0–34.0)
MCHC: 31.6 g/dL (ref 30.0–36.0)
MCV: 83.1 fL (ref 78.0–100.0)
Platelets: 197 10*3/uL (ref 150–400)
RBC: 4.31 MIL/uL (ref 4.22–5.81)
RDW: 15.5 % (ref 11.5–15.5)
WBC: 12.1 10*3/uL — AB (ref 4.0–10.5)

## 2017-08-27 LAB — BASIC METABOLIC PANEL
ANION GAP: 11 (ref 5–15)
BUN: 17 mg/dL (ref 6–20)
CALCIUM: 8.1 mg/dL — AB (ref 8.9–10.3)
CO2: 26 mmol/L (ref 22–32)
Chloride: 99 mmol/L — ABNORMAL LOW (ref 101–111)
Creatinine, Ser: 0.85 mg/dL (ref 0.61–1.24)
GLUCOSE: 120 mg/dL — AB (ref 65–99)
POTASSIUM: 3.3 mmol/L — AB (ref 3.5–5.1)
Sodium: 136 mmol/L (ref 135–145)

## 2017-08-27 LAB — GLUCOSE, CAPILLARY
GLUCOSE-CAPILLARY: 117 mg/dL — AB (ref 65–99)
Glucose-Capillary: 104 mg/dL — ABNORMAL HIGH (ref 65–99)
Glucose-Capillary: 103 mg/dL — ABNORMAL HIGH (ref 65–99)

## 2017-08-27 MED ORDER — POTASSIUM CHLORIDE 10 MEQ/50ML IV SOLN
10.0000 meq | INTRAVENOUS | Status: AC
  Administered 2017-08-27 (×3): 10 meq via INTRAVENOUS
  Filled 2017-08-27 (×3): qty 50

## 2017-08-27 MED ORDER — FUROSEMIDE 10 MG/ML IJ SOLN
40.0000 mg | Freq: Three times a day (TID) | INTRAMUSCULAR | Status: DC
  Administered 2017-08-27 – 2017-08-29 (×6): 40 mg via INTRAVENOUS
  Filled 2017-08-27 (×6): qty 4

## 2017-08-27 MED ORDER — FUROSEMIDE 10 MG/ML IJ SOLN: 40.0000 mg | Freq: Three times a day (TID) | INTRAMUSCULAR | Status: DC

## 2017-08-27 MED ORDER — POTASSIUM CHLORIDE 10 MEQ/50ML IV SOLN
10.0000 meq | INTRAVENOUS | Status: AC
  Administered 2017-08-27 (×4): 10 meq via INTRAVENOUS
  Filled 2017-08-27 (×4): qty 50

## 2017-08-27 MED ORDER — MOVING RIGHT ALONG BOOK
Freq: Once | Status: AC
  Administered 2017-08-27: 10:00:00
  Filled 2017-08-27: qty 1

## 2017-08-27 MED ORDER — SPIRONOLACTONE 25 MG PO TABS
25.0000 mg | ORAL_TABLET | Freq: Every day | ORAL | Status: DC
  Administered 2017-08-27 – 2017-09-01 (×6): 25 mg via ORAL
  Filled 2017-08-27 (×6): qty 1

## 2017-08-27 MED ORDER — LISINOPRIL 10 MG PO TABS
10.0000 mg | ORAL_TABLET | Freq: Every day | ORAL | Status: DC
  Administered 2017-08-27 – 2017-09-01 (×6): 10 mg via ORAL
  Filled 2017-08-27 (×6): qty 1

## 2017-08-27 MED ORDER — POTASSIUM CHLORIDE CRYS ER 20 MEQ PO TBCR
40.0000 meq | EXTENDED_RELEASE_TABLET | Freq: Two times a day (BID) | ORAL | Status: DC
  Administered 2017-08-28 – 2017-09-01 (×8): 40 meq via ORAL
  Filled 2017-08-27 (×8): qty 2

## 2017-08-27 MED ORDER — CHLORHEXIDINE GLUCONATE CLOTH 2 % EX PADS
6.0000 | MEDICATED_PAD | Freq: Every day | CUTANEOUS | Status: DC
  Administered 2017-08-28 – 2017-09-01 (×3): 6 via TOPICAL

## 2017-08-27 MED ORDER — SODIUM CHLORIDE 0.9% FLUSH: 10.0000 mL | INTRAVENOUS | Status: DC | PRN

## 2017-08-27 NOTE — Progress Notes (Signed)
Occupational Therapy Evaluation Patient Details Name: Reginald [name] MRN: [id number] DOB: 12-11-1970 Today's Date: 08/27/2017    History of Present Illness 47 year old male with a past history of hypertension, chronic diastolic heart failure, COPD, morbid obesity, pulmonary hypertension, and aortic stenosis status post aortic valve replacement.  Postop, he is developed right bundle branch block.  He is also in atrial fibrillation with intermittent ventricular pacing from his temporary pacing wires.  He is being considered for PPM.    Clinical Impression   PTA, pt independent and worked as a Administrator. Pt's nephew lives with him. Pt currently requires mod A with ADL due to body habitus and sternotomy precautions and will benefit from OT for ADL retraining with use of compensatory strategies and AE/DME. Pt complaining of R shoulder pain and appears to demonstrate possible impingement as noted below. Discussed with nursing - possible use of pain patch/volteran gel in conjunction with ice. Began education with pt regarding posture and positioning to reduce symptoms of impingement. Pt with significant BLE edema. Will  explore options for sizing, however, may need to use ace bandages to compression wrap at this time, in addition to elevating BLE.  Will follow acutely to facilitate safe DC home and maximize level of independence.     Follow Up Recommendations  Home health OT;Supervision - Intermittent    Equipment Recommendations  3 in 1 bedside commode(wide BSC)    Recommendations for Other Services       Precautions / Restrictions Precautions Precautions: Sternal Precaution Comments: Pt able to report correct sternal precautions and pillow use. Pacing wires Restrictions Other Position/Activity Restrictions: sternal precautions      Mobility Bed Mobility               General bed mobility comments: Pt was OOB in recliner chair.  Transfers Overall transfer level: Needs assistance  +2 min guard per PT note       General transfer comment: pt declined at this time due to just "feeling tired"    Balance                             ADL either performed or assessed with clinical judgement   ADL Overall ADL's : Needs assistance/impaired     Grooming: Set up;Sitting   Upper Body Bathing: Minimal assistance;Sitting   Lower Body Bathing: Moderate assistance;Sit to/from stand   Upper Body Dressing : Moderate assistance;Sitting   Lower Body Dressing: Moderate assistance;Sit to/from stand   Toilet Transfer: +2 for physical assistance           Functional mobility during ADLs: +2 for physical assistance General ADL Comments: Pt is unable to reach his feet or cross his legs. Pt will need AE and compensatory techniques for ADL     Vision         Perception     Praxis      Pertinent Vitals/Pain Pain Assessment: Faces Faces Pain Scale: Hurts a little bit Pain Location: chest; R shoulder Pain Descriptors / Indicators: Guarding;Grimacing Pain Intervention(s): Limited activity within patient's tolerance     Hand Dominance Right   Extremity/Trunk Assessment Upper Extremity Assessment Upper Extremity Assessment: RUE deficits/detail RUE Deficits / Details: Pt with possible impingement symptoms R shoulder. Tender over supraspinatus; states "I get by". Staes L shoulder is worse after surgery.will further assess   Lower Extremity Assessment Lower Extremity Assessment: (BLE edema)   Cervical / Trunk Assessment Cervical / Trunk Assessment:  Other exceptions(body habitus)   Communication Communication Communication: No difficulties   Cognition Arousal/Alertness: Awake/alert Behavior During Therapy: WFL for tasks assessed/performed Overall Cognitive Status: Within Functional Limits for tasks assessed                                     General Comments       Exercises Exercises: Other exercises Other Exercises Other  Exercises: scapular retraction; postural education Other Exercises: ankle pumps and LAQs encouraged in sitting.   Shoulder Instructions      Home Living Family/patient expects to be discharged to:: Private residence Living Arrangements: Other relatives Available Help at Discharge: Family;Available PRN/intermittently Type of Home: House Home Access: Stairs to enter [employer] of Steps: 1   Home Layout: One level     Bathroom Shower/Tub: Corporate investment banker: Handicapped height Bathroom Accessibility: Yes How Accessible: Accessible via walker(thinks it was accessible)            Prior Functioning/Environment Level of Independence: Independent        Comments: community ambulator, driver worked as Holiday representative Problem List: Decreased strength;Decreased range of motion;Decreased activity tolerance;Decreased knowledge of use of DME or AE;Decreased knowledge of precautions;Cardiopulmonary status limiting activity;Obesity;Impaired UE functional use;Increased edema;Pain      OT Treatment/Interventions: Self-care/ADL training;Therapeutic exercise;Energy conservation;DME and/or AE instruction;Therapeutic activities;Patient/family education;Balance training    OT Goals(Current goals can be found in the care plan section) Acute Rehab OT Goals Patient Stated Goal: to get better OT Goal Formulation: With patient Time For Goal Achievement: 09/10/17 Potential to Achieve Goals: Good  OT Frequency: Min 2X/week   Barriers to D/C:            Co-evaluation              AM-PAC PT "6 Clicks" Daily Activity     Outcome Measure Help from another person eating meals?: None Help from another person taking care of personal grooming?: A Little Help from another person toileting, which includes using toliet, bedpan, or urinal?: A Lot Help from another person bathing (including washing, rinsing, drying)?: A Lot Help from another person to  put on and taking off regular upper body clothing?: A Little Help from another person to put on and taking off regular lower body clothing?: A Lot 6 Click Score: 16   End of Session Nurse Communication: Other (comment)(R shoulder management adn compression stockings)  Activity Tolerance: Patient limited by fatigue Patient left: in chair;with call bell/phone within reach  OT Visit Diagnosis: Unsteadiness on feet (R26.81);Muscle weakness (generalized) (M62.81);Pain Pain - Right/Left: Right Pain - part of body: Shoulder(chest)                Time: 4315-4008 OT Time Calculation (min): 22 min Charges:  OT General Charges $OT Visit: 1 Visit OT Evaluation $OT Eval Moderate Complexity: 1 Mod G-Codes:     [place], OT/L  [phone] 08/27/2017  Anahy Esh,HILLARY 08/27/2017, 5:29 PM

## 2017-08-27 NOTE — Progress Notes (Signed)
Patient ID: Reginald [name], male   DOB: Mar 05, 1971, 47 y.o.   MRN: [id number] TCTS Evening Rounds:  Hemodynamically stable  Atrial paced at 80  Diuresing well, -1600 cc so far today.  Worked with PT/OT today.

## 2017-08-27 NOTE — Progress Notes (Signed)
Peripherally Inserted Central Catheter/Midline Placement  The IV Nurse has discussed with the patient and/or persons authorized to consent for the patient, the purpose of this procedure and the potential benefits and risks involved with this procedure.  The benefits include less needle sticks, lab draws from the catheter, and the patient may be discharged home with the catheter. Risks include, but not limited to, infection, bleeding, blood clot (thrombus formation), and puncture of an artery; nerve damage and irregular heartbeat and possibility to perform a PICC exchange if needed/ordered by physician.  Alternatives to this procedure were also discussed.  Bard Power PICC patient education guide, fact sheet on infection prevention and patient information card has been provided to patient /or left at bedside.    PICC/Midline Placement Documentation  PICC Double Lumen 44/01/02 PICC Right Basilic 52 cm 2 cm (Active)  Indication for Insertion or Continuance of Line Administration of hyperosmolar/irritating solutions (i.e. TPN, Vancomycin, etc.) 08/27/2017 12:16 PM  Exposed Catheter (cm) 2 cm 08/27/2017 12:16 PM  Site Assessment Dry;Clean;Intact 08/27/2017 12:16 PM  Lumen #1 Status Flushed;Blood return noted 08/27/2017 12:16 PM  Lumen #2 Status Flushed;Blood return noted 08/27/2017 12:16 PM  Dressing Type Transparent 08/27/2017 12:16 PM  Dressing Status Dry;Clean;Intact;Antimicrobial disc in place 08/27/2017 12:16 PM  Dressing Intervention New dressing 08/27/2017 12:16 PM  Dressing Change Due 09/03/17 08/27/2017 12:16 PM       [address] 08/27/2017, 12:17 PM

## 2017-08-27 NOTE — Plan of Care (Signed)
  Problem: Education: Goal: Knowledge of disease or condition will improve Outcome: Progressing Goal: Knowledge of the prescribed therapeutic regimen will improve Outcome: Progressing   Problem: Activity: Goal: Risk for activity intolerance will decrease Outcome: Progressing   Problem: Cardiac: Goal: Hemodynamic stability will improve Outcome: Progressing   Problem: Respiratory: Goal: Respiratory status will improve Outcome: Progressing   Problem: Skin Integrity: Goal: Wound healing without signs and symptoms of infection Outcome: Progressing   Problem: Urinary Elimination: Goal: Ability to achieve and maintain adequate renal perfusion and functioning will improve Outcome: Progressing

## 2017-08-27 NOTE — Progress Notes (Signed)
[address]       [place] [address]             [phone]        CARDIOTHORACIC SURGERY PROGRESS NOTE   R4 Days Post-Op Procedure(s) (LRB): AORTIC VALVE REPLACEMENT (AVR) USING INSPIRIS RESILIA AORTIC VALVE SIZE 25 MM (N/A) THORACIC ASCENDING ANEURYSM REPAIR (AAA) USING HEMASHIELD GOLD KNITTED MICROVEL DOUBLE VELOUR VASCULAR GRAFT D: 24 MM  L: 30 CM (N/A) TRANSESOPHAGEAL ECHOCARDIOGRAM (TEE) (N/A)  Subjective: Feels okay.  Chest discomfort improving.  Dyspneic with activity  Objective: Vital signs: BP Readings from Last 1 Encounters:  08/27/17 134/76   Pulse Readings from Last 1 Encounters:  08/27/17 70   Resp Readings from Last 1 Encounters:  08/27/17 (!) 23   Temp Readings from Last 1 Encounters:  08/27/17 98.3 F (36.8 C) (Oral)    Hemodynamics:    Physical Exam:  Rhythm:   Asystole under pacer - currently DDD pacing  Breath sounds: clear  Heart sounds:  RRR  Incisions:  Dressing dry, intact  Abdomen:  Soft, non-distended, non-tender  Extremities:  Warm, well-perfused, swollen   Intake/Output from previous day: 04/07 0701 - 04/08 0700 In: 2334.1 [P.O.:1800; I.V.:384.1; IV Piggyback:150] Out: 2100 [Urine:2100] Intake/Output this shift: Total I/O In: 50 [IV Piggyback:50] Out: 450 [Urine:450]  Lab Results:  CBC: Recent Labs    08/26/17 0445 08/27/17 0443  WBC 13.0* 12.1*  HGB 11.4* 11.3*  HCT 35.0* 35.8*  PLT 169 197    BMET:  Recent Labs    08/26/17 0445 08/27/17 0443  NA 137 136  K 3.4* 3.3*  CL 99* 99*  CO2 27 26  GLUCOSE 114* 120*  BUN 18 17  CREATININE 0.77 0.85  CALCIUM 8.3* 8.1*     PT/INR:  No results for input(s): LABPROT, INR in the last 72 hours.  CBG (last 3)  Recent Labs    08/26/17 1658 08/26/17 2146 08/27/17 0804  GLUCAP 90 100* 104*    ABG    Component Value Date/Time   PHART 7.436 08/24/2017 0332   PCO2ART 39.9 08/24/2017 0332   PO2ART 64.0 (L) 08/24/2017 0332   HCO3 27.0  08/24/2017 0332   TCO2 26 08/24/2017 1828   ACIDBASEDEF 2.0 08/23/2017 1858   O2SAT 93.0 08/24/2017 0332    CXR: PORTABLE CHEST 1 VIEW  COMPARISON:  August 25, 2017  FINDINGS: Cordis tip is in superior vena cava. Mediastinal drain has been removed. No pneumothorax. There are pleural effusions bilaterally, larger on the right than on the left. There is atelectatic change in the bases. There is cardiomegaly with mild pulmonary venous hypertension. No adenopathy. No bone lesions.  IMPRESSION: Pulmonary vascular congestion with pleural effusions bilaterally. Bibasilar atelectasis. No frank edema or consolidation. No pneumothorax.   Electronically Signed   By: Lowella Grip III M.D.   On: 08/27/2017 07:31  Assessment/Plan: S/P Procedure(s) (LRB): AORTIC VALVE REPLACEMENT (AVR) USING INSPIRIS RESILIA AORTIC VALVE SIZE 25 MM (N/A) THORACIC ASCENDING ANEURYSM REPAIR (AAA) USING HEMASHIELD GOLD KNITTED MICROVEL DOUBLE VELOUR VASCULAR GRAFT D: 24 MM  L: 30 CM (N/A) TRANSESOPHAGEAL ECHOCARDIOGRAM (TEE) (N/A)  Overall stable now POD4 but severe bradycardia/asystole under pacer Currently still on IV amiodarone Acute on chronic diastolic CHF with expected post-op volume excess, UOP good but I/O's balanced yesterday and weight still 12 lbs > preop Hypokalemia, induced by loop diuretics   Will stop amiodarone for now  Supplement potassium  Insert PICC and remove old central line  Increase lasix  Restart ACE-I at reduced dose  Add spironolactone  Mobilize as much as possible   Rexene Alberts, MD 08/27/2017 8:52 AM

## 2017-08-27 NOTE — Progress Notes (Signed)
Physical Therapy Treatment Patient Details Name: Reginald [name] MRN: [id number] DOB: Feb 28, 1971 Today's Date: 08/27/2017    History of Present Illness 47 year old male with a past history of hypertension, chronic diastolic heart failure, COPD, morbid obesity, pulmonary hypertension, and aortic stenosis status post aortic valve replacement.  Postop, he is developed right bundle branch block.  He is also in atrial fibrillation with intermittent ventricular pacing from his temporary pacing wires.  He is being considered for PPM.     PT Comments    Pt is progressing with gait an mobility.  He continues to take standing rest breaks with chair to follow for safety, but did not take any seated rest breaks.  He is worried about his LE edema and mentioned this to the RN.  I issued ankle pumps and LAQs for him to do while feet are down in the recliner chair.  Pt will likely only need two person assist next session (three used today).   Attempt to get gait speed next session.  Follow Up Recommendations  Home health PT;Supervision/Assistance - 24 hour     Equipment Recommendations  Rolling walker with 5" wheels(bariatric)    Recommendations for Other Services   NA     Precautions / Restrictions Precautions Precautions: Sternal Precaution Comments: Pt able to report correct sternal precautions and pillow use. Restrictions Other Position/Activity Restrictions: sternal precautions    Mobility  Bed Mobility               General bed mobility comments: Pt was OOB in recliner chair.  Transfers Overall transfer level: Needs assistance Equipment used: Rolling walker (2 wheeled)(EVA walker) Transfers: Sit to/from Stand Sit to Stand: +2 safety/equipment;Min guard         General transfer comment: Pt generally able to transition to standing without external physical assist min guard assist for safety during transition to sit down as it is more uncontrolled.  Pt did well not using his arms  during transitions to abide by his sternal precautions.   Ambulation/Gait Ambulation/Gait assistance: Min guard;+2 safety/equipment(+3 used, but I think next session could be +2) Ambulation Distance (Feet): 150 Feet Assistive device: Rolling walker (2 wheeled)(EVA walker) Gait Pattern/deviations: Step-through pattern;Shuffle;Trunk flexed Gait velocity: slowed Gait velocity interpretation: Below normal speed for age/gender General Gait Details: Pt with slow, shuffling gait, at times he would stop and stretch up straightening his posture. Pt took three standing rest breaks during our walk, but did not have to sit in the recliner which was being pushed behind him for safety.  Ambulated on 2 L O2 [place] with VSS and HR stable (a few PVCs during gait).  Verbal reminders for pursed lip breathing and not panting as well as eyes open during standing rest breaks.       Balance Overall balance assessment: Needs assistance Sitting-balance support: Feet supported;No upper extremity supported Sitting balance-Leahy Scale: Good Sitting balance - Comments: able to bring trunk off of recliner un assisted and sit with .     Standing balance support: Bilateral upper extremity supported Standing balance-Leahy Scale: Fair                              Cognition Arousal/Alertness: Awake/alert Behavior During Therapy: WFL for tasks assessed/performed Overall Cognitive Status: Within Functional Limits for tasks assessed  Exercises Other Exercises Other Exercises: IS x 10 reps 1750 mL max observed inhalation volume.  Cues for slow breaths when asked. Other Exercises: ankle pumps and LAQs encouraged in sitting.    General Comments        Pertinent Vitals/Pain Pain Assessment: Faces Faces Pain Scale: Hurts even more Pain Location: chest Pain Descriptors / Indicators: Guarding;Grimacing Pain Intervention(s): Limited activity within  patient's tolerance;Monitored during session;Repositioned           PT Goals (current goals can now be found in the care plan section) Acute Rehab PT Goals Patient Stated Goal: feel better Progress towards PT goals: Progressing toward goals    Frequency    Min 3X/week      PT Plan Current plan remains appropriate       AM-PAC PT "6 Clicks" Daily Activity  Outcome Measure  Difficulty turning over in bed (including adjusting bedclothes, sheets and blankets)?: Unable Difficulty moving from lying on back to sitting on the side of the bed? : Unable Difficulty sitting down on and standing up from a chair with arms (e.g., wheelchair, bedside commode, etc,.)?: Unable Help needed moving to and from a bed to chair (including a wheelchair)?: A Little Help needed walking in hospital room?: A Little Help needed climbing 3-5 steps with a railing? : A Lot 6 Click Score: 11    End of Session Equipment Utilized During Treatment: Gait belt;Oxygen Activity Tolerance: Patient limited by fatigue Patient left: in chair;with call bell/phone within reach Nurse Communication: Mobility status PT Visit Diagnosis: Unsteadiness on feet (R26.81);Other abnormalities of gait and mobility (R26.89);Difficulty in walking, not elsewhere classified (R26.2);Pain Pain - Right/Left: (sternal) Pain - part of body: (sternal)     Time: 6803-2122 PT Time Calculation (min) (ACUTE ONLY): 33 min  Charges:  $Gait Training: 8-22 mins $Therapeutic Activity: 8-22 mins          [name] B. [place], [place], DPT [phone]            08/27/2017, 4:06 PM

## 2017-08-27 NOTE — Progress Notes (Signed)
Placed patient on CPAP for the night via auto-mode with minimum pressure set at 5cm and maximum pressure set at 20cm. Oxygen set at 2lpm with Sp02=97%. Will continue to monitor patient.

## 2017-08-27 NOTE — Progress Notes (Addendum)
Progress Note  Patient Name: Reginald [name] Date of Encounter: 08/27/2017  Primary Cardiologist: Kate Sable, MD   Subjective   Feels OK but still having pacer dependence.   Inpatient Medications    Scheduled Meds: . acetaminophen  1,000 mg Oral Q6H  . allopurinol  300 mg Oral Daily  . aspirin EC  325 mg Oral Daily  . bisacodyl  10 mg Oral Daily   Or  . bisacodyl  10 mg Rectal Daily  . Chlorhexidine Gluconate Cloth  6 each Topical Daily  . citalopram  20 mg Oral QHS  . docusate sodium  200 mg Oral Daily  . enoxaparin (LOVENOX) injection  40 mg Subcutaneous QHS  . furosemide  40 mg Intravenous TID AC  . insulin aspart  0-15 Units Subcutaneous TID WC  . lisinopril  10 mg Oral Daily  . mouth rinse  15 mL Mouth Rinse BID  . moving right along book   Does not apply Once  . pantoprazole  40 mg Oral Daily  . [START ON 08/28/2017] potassium chloride  40 mEq Oral BID  . sodium chloride flush  10-40 mL Intracatheter Q12H  . sodium chloride flush  3 mL Intravenous Q12H  . spironolactone  25 mg Oral Daily   Continuous Infusions: . sodium chloride 250 mL (08/24/17 0550)  . potassium chloride 10 mEq (08/27/17 0820)  . potassium chloride     PRN Meds: clonazePAM, metoprolol tartrate, morphine injection, ondansetron (ZOFRAN) IV, oxyCODONE, sodium chloride flush, sodium chloride flush, traMADol   Vital Signs    Vitals:   08/27/17 0600 08/27/17 0700 08/27/17 0800 08/27/17 0807  BP: 118/71 129/74 134/76   Pulse: 69 71 70   Resp: (!) 22 18 (!) 23   Temp:    98.3 F (36.8 C)  TempSrc:    Oral  SpO2: 97% 96% 97%   Weight:      Height:        Intake/Output Summary (Last 24 hours) at 08/27/2017 0922 Last data filed at 08/27/2017 0847 Gross per 24 hour  Intake 2110.7 ml  Output 2550 ml  Net -439.3 ml   Filed Weights   08/25/17 0600 08/26/17 0600 08/27/17 0443  Weight: (!) 433 lb 3.3 oz (196.5 kg) (!) 430 lb 12.5 oz (195.4 kg) (!) 429 lb 14.4 oz (195 kg)    Telemetry      Long pause when pacer on hold. AFIB underlying - Personally Reviewed  ECG    AFIB, paced - Personally Reviewed  Physical Exam   GEN: No acute distress.  Overweight Neck: No JVD Cardiac: RRR, ectopy, no murmurs, rubs, or gallops. Pacer wire Respiratory: Clear to auscultation bilaterally. GI: Soft, nontender, non-distended  MS: No edema; No deformity. Neuro:  Nonfocal  Psych: Normal affect   Labs    Chemistry Recent Labs  Lab 08/21/17 1048  08/25/17 0354 08/26/17 0445 08/27/17 0443  NA 135   < > 134* 137 136  K 3.2*   < > 3.9 3.4* 3.3*  CL 100*   < > 98* 99* 99*  CO2 24   < > 26 27 26   GLUCOSE 110*   < > 124* 114* 120*  BUN 15   < > 24* 18 17  CREATININE 0.78   < > 0.97 0.77 0.85  CALCIUM 9.1   < > 8.0* 8.3* 8.1*  PROT 7.6  --   --   --   --   ALBUMIN 3.7  --   --   --   --  AST 29  --   --   --   --   ALT 25  --   --   --   --   ALKPHOS 79  --   --   --   --   BILITOT 0.5  --   --   --   --   GFRNONAA >60   < > >60 >60 >60  GFRAA >60   < > >60 >60 >60  ANIONGAP 11   < > 10 11 11    < > = values in this interval not displayed.     Hematology Recent Labs  Lab 08/25/17 0610 08/26/17 0445 08/27/17 0443  WBC 14.8* 13.0* 12.1*  RBC 4.44 4.21* 4.31  HGB 12.1* 11.4* 11.3*  HCT 37.4* 35.0* 35.8*  MCV 84.2 83.1 83.1  MCH 27.3 27.1 26.2  MCHC 32.4 32.6 31.6  RDW 16.4* 15.8* 15.5  PLT 162 169 197    Cardiac EnzymesNo results for input(s): TROPONINI in the last 168 hours. No results for input(s): TROPIPOC in the last 168 hours.   BNPNo results for input(s): BNP, PROBNP in the last 168 hours.   DDimer No results for input(s): DDIMER in the last 168 hours.   Radiology    Dg Chest Port 1 View  Result Date: 08/27/2017 CLINICAL DATA:  Status post aortic valve replacement. Morbid obesity. EXAM: PORTABLE CHEST 1 VIEW COMPARISON:  August 25, 2017 FINDINGS: Cordis tip is in superior vena cava. Mediastinal drain has been removed. No pneumothorax. There are pleural  effusions bilaterally, larger on the right than on the left. There is atelectatic change in the bases. There is cardiomegaly with mild pulmonary venous hypertension. No adenopathy. No bone lesions. IMPRESSION: Pulmonary vascular congestion with pleural effusions bilaterally. Bibasilar atelectasis. No frank edema or consolidation. No pneumothorax. Electronically Signed   By: Lowella Grip III M.D.   On: 08/27/2017 07:31   Dg Chest Port 1 View  Result Date: 08/25/2017 CLINICAL DATA:  Aortic valve replacement EXAM: PORTABLE CHEST 1 VIEW COMPARISON:  08/25/2017 FINDINGS: 1208 hours. The cardio pericardial silhouette is enlarged. Stable asymmetric elevation right hemidiaphragm. Basilar atelectasis again noted bilaterally. Vascular congestion without overt edema. No substantial pleural effusion. Midline mediastinal/pericardial drain again noted. Right IJ sheath remains in place. IMPRESSION: 1. No substantial change. 2. Cardiomegaly with vascular congestion and basilar atelectasis. Electronically Signed   By: Misty Stanley M.D.   On: 08/25/2017 15:11   [place] Ekg Site Rite  Result Date: 08/27/2017 If Site Rite image not attached, placement could not be confirmed due to current cardiac rhythm.   Cardiac Studies   EF normal (pre op severe AS)  Patient Profile     47 y.o. male post op AFIB, AVR, RBBB new as well  Assessment & Plan    AFIB  - he continues to demonstrate AFIB with poor AV node conduction. When pacer turned off, pause.   - IV amiodarone has now been stopped by Dr. Roxy Manns (on 4/8)  - There is a strong likelihood that he will require permanent pacemaker (4 days post op now)  - no Bb  RBBB  - new  Hypokalemia  - 3.3, supplimenting.    For questions or updates, please contact [place] Please consult [email or web address] for contact info under Cardiology/STEMI.      Signed, Candee Furbish, MD  08/27/2017, 9:22 AM

## 2017-08-28 ENCOUNTER — Inpatient Hospital Stay (HOSPITAL_COMMUNITY)
Admission: RE | Disposition: A | Discharge status: Home / Self Care | Payer: Self-pay | Source: Ambulatory Visit | Attending: Thoracic Surgery (Cardiothoracic Vascular Surgery)

## 2017-08-28 DIAGNOSIS — I442 Atrioventricular block, complete: Secondary | ICD-10-CM

## 2017-08-28 DIAGNOSIS — I4430 Unspecified atrioventricular block: Secondary | ICD-10-CM

## 2017-08-28 HISTORY — PX: PACEMAKER IMPLANT: EP1218

## 2017-08-28 LAB — BASIC METABOLIC PANEL
Anion gap: 9 (ref 5–15)
BUN: 17 mg/dL (ref 6–20)
CO2: 28 mmol/L (ref 22–32)
CREATININE: 0.8 mg/dL (ref 0.61–1.24)
Calcium: 8.4 mg/dL — ABNORMAL LOW (ref 8.9–10.3)
Chloride: 100 mmol/L — ABNORMAL LOW (ref 101–111)
GFR calc Af Amer: >60 mL/min (ref >60)
GFR calc non Af Amer: >60 mL/min (ref >60)
GLUCOSE: 103 mg/dL — AB (ref 65–99)
Potassium: 3.4 mmol/L — ABNORMAL LOW (ref 3.5–5.1)
Sodium: 137 mmol/L (ref 135–145)

## 2017-08-28 LAB — CBC
HEMATOCRIT: 34.8 % — AB (ref 39.0–52.0)
Hemoglobin: 11.3 g/dL — ABNORMAL LOW (ref 13.0–17.0)
MCH: 27 pg (ref 26.0–34.0)
MCHC: 32.5 g/dL (ref 30.0–36.0)
MCV: 83.3 fL (ref 78.0–100.0)
Platelets: 210 10*3/uL (ref 150–400)
RBC: 4.18 MIL/uL — ABNORMAL LOW (ref 4.22–5.81)
RDW: 15.5 % (ref 11.5–15.5)
WBC: 11.8 10*3/uL — ABNORMAL HIGH (ref 4.0–10.5)

## 2017-08-28 LAB — GLUCOSE, CAPILLARY
GLUCOSE-CAPILLARY: 122 mg/dL — AB (ref 65–99)
GLUCOSE-CAPILLARY: 137 mg/dL — AB (ref 65–99)
Glucose-Capillary: 95 mg/dL (ref 65–99)
Glucose-Capillary: 93 mg/dL (ref 65–99)
Glucose-Capillary: 96 mg/dL (ref 65–99)

## 2017-08-28 LAB — MAGNESIUM: Magnesium: 2.1 mg/dL (ref 1.7–2.4)

## 2017-08-28 LAB — POTASSIUM: Potassium: 3.9 mmol/L (ref 3.5–5.1)

## 2017-08-28 SURGERY — PACEMAKER IMPLANT

## 2017-08-28 MED ORDER — HYDROCODONE-ACETAMINOPHEN 5-325 MG PO TABS
1.0000 | ORAL_TABLET | ORAL | Status: DC | PRN
  Administered 2017-09-01: 2 via ORAL
  Filled 2017-08-28: qty 2

## 2017-08-28 MED ORDER — LIDOCAINE HCL (PF) 1 % IJ SOLN
INTRAMUSCULAR | Status: DC | PRN
  Administered 2017-08-28: 60 mL

## 2017-08-28 MED ORDER — IOPAMIDOL (ISOVUE-370) INJECTION 76%
INTRAVENOUS | Status: AC
  Filled 2017-08-28: qty 50

## 2017-08-28 MED ORDER — SODIUM CHLORIDE 0.9 % IV SOLN
INTRAVENOUS | Status: AC
  Filled 2017-08-28: qty 2

## 2017-08-28 MED ORDER — FENTANYL CITRATE (PF) 100 MCG/2ML IJ SOLN
INTRAMUSCULAR | Status: DC | PRN
  Administered 2017-08-28 (×2): 25 ug via INTRAVENOUS
  Administered 2017-08-28 (×4): 12.5 ug via INTRAVENOUS

## 2017-08-28 MED ORDER — CHLORHEXIDINE GLUCONATE 4 % EX LIQD: 60.0000 mL | Freq: Once | CUTANEOUS | Status: DC

## 2017-08-28 MED ORDER — ACETAMINOPHEN 325 MG PO TABS: 325.0000 mg | ORAL_TABLET | ORAL | Status: DC | PRN

## 2017-08-28 MED ORDER — SODIUM CHLORIDE 0.9% FLUSH
3.0000 mL | INTRAVENOUS | Status: DC | PRN
  Administered 2017-08-31: 10 mL via INTRAVENOUS
  Filled 2017-08-28: qty 3

## 2017-08-28 MED ORDER — GENTAMICIN SULFATE 40 MG/ML IJ SOLN
80.0000 mg | INTRAMUSCULAR | Status: AC
  Administered 2017-08-28: 80 mg

## 2017-08-28 MED ORDER — POTASSIUM CHLORIDE CRYS ER 20 MEQ PO TBCR
20.0000 meq | EXTENDED_RELEASE_TABLET | ORAL | Status: AC
  Administered 2017-08-28: 20 meq via ORAL
  Filled 2017-08-28: qty 1

## 2017-08-28 MED ORDER — AMIODARONE HCL 200 MG PO TABS
400.0000 mg | ORAL_TABLET | Freq: Two times a day (BID) | ORAL | Status: DC
  Administered 2017-08-28 – 2017-09-01 (×8): 400 mg via ORAL
  Filled 2017-08-28 (×8): qty 2

## 2017-08-28 MED ORDER — FENTANYL CITRATE (PF) 100 MCG/2ML IJ SOLN
INTRAMUSCULAR | Status: AC
Start: 2017-08-28 — End: ?
  Filled 2017-08-28: qty 2

## 2017-08-28 MED ORDER — CEFAZOLIN SODIUM-DEXTROSE 1-4 GM/50ML-% IV SOLN
1.0000 g | Freq: Four times a day (QID) | INTRAVENOUS | Status: AC
  Administered 2017-08-28 – 2017-08-29 (×3): 1 g via INTRAVENOUS
  Filled 2017-08-28 (×3): qty 50

## 2017-08-28 MED ORDER — LIDOCAINE HCL (PF) 1 % IJ SOLN
INTRAMUSCULAR | Status: AC
  Filled 2017-08-28: qty 30

## 2017-08-28 MED ORDER — SODIUM CHLORIDE 0.9 % IV SOLN: 250.0000 mL | INTRAVENOUS | Status: DC | PRN

## 2017-08-28 MED ORDER — POTASSIUM CHLORIDE 10 MEQ/50ML IV SOLN
10.0000 meq | INTRAVENOUS | Status: AC
  Administered 2017-08-28 (×3): 10 meq via INTRAVENOUS
  Filled 2017-08-28 (×3): qty 50

## 2017-08-28 MED ORDER — DEXTROSE 5 % IV SOLN
3.0000 g | INTRAVENOUS | Status: AC
  Administered 2017-08-28: 3 g via INTRAVENOUS
  Filled 2017-08-28: qty 3000

## 2017-08-28 MED ORDER — MIDAZOLAM HCL 5 MG/5ML IJ SOLN
INTRAMUSCULAR | Status: DC | PRN
  Administered 2017-08-28 (×4): 1 mg via INTRAVENOUS

## 2017-08-28 MED ORDER — ONDANSETRON HCL 4 MG/2ML IJ SOLN: 4.0000 mg | Freq: Four times a day (QID) | INTRAMUSCULAR | Status: DC | PRN

## 2017-08-28 MED ORDER — MAGNESIUM HYDROXIDE 400 MG/5ML PO SUSP
30.0000 mL | Freq: Every day | ORAL | Status: DC | PRN
  Administered 2017-08-28: 30 mL via ORAL
  Filled 2017-08-28: qty 30

## 2017-08-28 MED ORDER — MIDAZOLAM HCL 5 MG/5ML IJ SOLN
INTRAMUSCULAR | Status: AC
  Filled 2017-08-28: qty 5

## 2017-08-28 MED ORDER — SODIUM CHLORIDE 0.9 % IV SOLN: INTRAVENOUS | Status: DC

## 2017-08-28 MED ORDER — HEPARIN (PORCINE) IN NACL 2-0.9 UNIT/ML-% IJ SOLN
INTRAMUSCULAR | Status: AC | PRN
  Administered 2017-08-28: 500 mL

## 2017-08-28 MED ORDER — CHLORHEXIDINE GLUCONATE 4 % EX LIQD
60.0000 mL | Freq: Once | CUTANEOUS | Status: DC
  Administered 2017-08-28: 4 via TOPICAL

## 2017-08-28 MED ORDER — SODIUM CHLORIDE 0.9% FLUSH
3.0000 mL | Freq: Two times a day (BID) | INTRAVENOUS | Status: DC
  Administered 2017-08-28 – 2017-08-31 (×5): 3 mL via INTRAVENOUS
  Administered 2017-08-31: 10 mL via INTRAVENOUS
  Administered 2017-09-01: 3 mL via INTRAVENOUS

## 2017-08-28 MED ORDER — IOPAMIDOL (ISOVUE-370) INJECTION 76%
INTRAVENOUS | Status: DC | PRN
  Administered 2017-08-28: 15 mL via INTRAVENOUS

## 2017-08-28 MED FILL — Sodium Bicarbonate IV Soln 8.4%: INTRAVENOUS | Qty: 50 | Status: AC

## 2017-08-28 MED FILL — Calcium Chloride Inj 10%: INTRAVENOUS | Qty: 10 | Status: AC

## 2017-08-28 MED FILL — Lidocaine HCl IV Inj 20 MG/ML: INTRAVENOUS | Qty: 25 | Status: AC

## 2017-08-28 MED FILL — Heparin Sodium (Porcine) Inj 1000 Unit/ML: INTRAMUSCULAR | Qty: 60 | Status: AC

## 2017-08-28 MED FILL — Electrolyte-R (PH 7.4) Solution: INTRAVENOUS | Qty: 5000 | Status: AC

## 2017-08-28 MED FILL — Sodium Chloride IV Soln 0.9%: INTRAVENOUS | Qty: 4000 | Status: AC

## 2017-08-28 MED FILL — Heparin Sodium (Porcine) Inj 1000 Unit/ML: INTRAMUSCULAR | Qty: 10 | Status: AC

## 2017-08-28 MED FILL — Mannitol IV Soln 20%: INTRAVENOUS | Qty: 1000 | Status: AC

## 2017-08-28 SURGICAL SUPPLY — 7 items
CABLE SURGICAL S-101-97-12 (CABLE) ×3 IMPLANT
LEAD TENDRIL MRI 52CM LPA1200M (Lead) ×3 IMPLANT
LEAD TENDRIL MRI 58CM LPA1200M (Lead) ×3 IMPLANT
PACEMAKER ASSURITY DR-RF (Pacemaker) ×3 IMPLANT
PAD DEFIB LIFELINK (PAD) ×3 IMPLANT
SHEATH CLASSIC 8F (SHEATH) ×6 IMPLANT
TRAY PACEMAKER INSERTION (PACKS) ×3 IMPLANT

## 2017-08-28 NOTE — Progress Notes (Signed)
T[facility] BRIEF SICU PROGRESS NOTE  Day of Surgery  S/P Procedure(s) (LRB): PACEMAKER IMPLANT (N/A)   Doing well after pacer Minimal discomfort Rhythm and BP stable  Plan: Continue current plan  Rexene Alberts, MD 08/28/2017 6:56 PM

## 2017-08-28 NOTE — Progress Notes (Signed)
Progress Note  Patient Name: Reginald [name] Date of Encounter: 08/28/2017  Primary Cardiologist: Kate Sable, MD   Subjective   Sitting in chair. Still having pacer dependence. No CP, no SOB.   Inpatient Medications    Scheduled Meds: . acetaminophen  1,000 mg Oral Q6H  . allopurinol  300 mg Oral Daily  . aspirin EC  325 mg Oral Daily  . bisacodyl  10 mg Oral Daily   Or  . bisacodyl  10 mg Rectal Daily  . Chlorhexidine Gluconate Cloth  6 each Topical Daily  . Chlorhexidine Gluconate Cloth  6 each Topical Daily  . citalopram  20 mg Oral QHS  . docusate sodium  200 mg Oral Daily  . enoxaparin (LOVENOX) injection  40 mg Subcutaneous QHS  . furosemide  40 mg Intravenous TID AC  . insulin aspart  0-15 Units Subcutaneous TID WC  . lisinopril  10 mg Oral Daily  . mouth rinse  15 mL Mouth Rinse BID  . pantoprazole  40 mg Oral Daily  . potassium chloride  20 mEq Oral Q4H  . potassium chloride  40 mEq Oral BID  . sodium chloride flush  10-40 mL Intracatheter Q12H  . sodium chloride flush  3 mL Intravenous Q12H  . spironolactone  25 mg Oral Daily   Continuous Infusions: . sodium chloride 250 mL (08/24/17 0550)  . potassium chloride 10 mEq (08/28/17 0845)   PRN Meds: clonazePAM, magnesium hydroxide, metoprolol tartrate, morphine injection, ondansetron (ZOFRAN) IV, oxyCODONE, sodium chloride flush, sodium chloride flush, sodium chloride flush, traMADol   Vital Signs    Vitals:   08/28/17 0500 08/28/17 0600 08/28/17 0700 08/28/17 0754  BP: 136/87 122/79 (!) 127/114   Pulse: 81 83 79   Resp: (!) 22 (!) 24 17   Temp:    99.4 F (37.4 C)  TempSrc:    Oral  SpO2: 97% 98% 98%   Weight: (!) 426 lb 13 oz (193.6 kg)     Height:        Intake/Output Summary (Last 24 hours) at 08/28/2017 0910 Last data filed at 08/28/2017 0600 Gross per 24 hour  Intake 940 ml  Output 3150 ml  Net -2210 ml   Filed Weights   08/26/17 0600 08/27/17 0443 08/28/17 0500  Weight: (!) 430 lb 12.5  oz (195.4 kg) (!) 429 lb 14.4 oz (195 kg) (!) 426 lb 13 oz (193.6 kg)    Telemetry    Long pause when pacer on hold. AFIB underlying. Occasional AFIB RVR on tele/ wide complex tachycardia.  - Personally Reviewed  ECG    AFIB, V paced - Personally Reviewed  Physical Exam   GEN: Well nourished, well developed, in no acute distress overweight HEENT: normal  Neck: no JVD, carotid bruits, or masses Cardiac: RRR with ectopy; no murmurs, rubs, or gallops,no edema  Respiratory:  clear to auscultation bilaterally, normal work of breathing GI: soft, nontender, nondistended, + BS MS: no deformity or atrophy  Skin: warm and dry, no rash Neuro:  Alert and Oriented x 3, Strength and sensation are intact Psych: euthymic mood, full affect   Labs    Chemistry Recent Labs  Lab 08/21/17 1048  08/26/17 0445 08/27/17 0443 08/28/17 0409  NA 135   < > 137 136 137  K 3.2*   < > 3.4* 3.3* 3.4*  CL 100*   < > 99* 99* 100*  CO2 24   < > 27 26 28   GLUCOSE 110*   < >  114* 120* 103*  BUN 15   < > 18 17 17   CREATININE 0.78   < > 0.77 0.85 0.80  CALCIUM 9.1   < > 8.3* 8.1* 8.4*  PROT 7.6  --   --   --   --   ALBUMIN 3.7  --   --   --   --   AST 29  --   --   --   --   ALT 25  --   --   --   --   ALKPHOS 79  --   --   --   --   BILITOT 0.5  --   --   --   --   GFRNONAA >60   < > >60 >60 >60  GFRAA >60   < > >60 >60 >60  ANIONGAP 11   < > 11 11 9    < > = values in this interval not displayed.     Hematology Recent Labs  Lab 08/26/17 0445 08/27/17 0443 08/28/17 0409  WBC 13.0* 12.1* 11.8*  RBC 4.21* 4.31 4.18*  HGB 11.4* 11.3* 11.3*  HCT 35.0* 35.8* 34.8*  MCV 83.1 83.1 83.3  MCH 27.1 26.2 27.0  MCHC 32.6 31.6 32.5  RDW 15.8* 15.5 15.5  PLT 169 197 210    Cardiac EnzymesNo results for input(s): TROPONINI in the last 168 hours. No results for input(s): TROPIPOC in the last 168 hours.   BNPNo results for input(s): BNP, PROBNP in the last 168 hours.   DDimer No results for  input(s): DDIMER in the last 168 hours.   Radiology    Dg Chest Port 1 View  Result Date: 08/27/2017 CLINICAL DATA:  Status post PICC placement. EXAM: PORTABLE CHEST 1 VIEW COMPARISON:  Radiograph of same day. FINDINGS: Stable cardiomegaly with central pulmonary vascular congestion. No pneumothorax is noted. Mild bibasilar subsegmental atelectasis is noted with probable pleural effusions. Interval placement of right-sided PICC line with distal tip in expected position of the SVC. Sternotomy wires are noted. Bony thorax is unremarkable. IMPRESSION: Stable cardiomegaly with central pulmonary vascular congestion. Interval placement of right-sided PICC line with distal tip in expected position of the SVC. Electronically Signed   By: Marijo Conception, M.D.   On: 08/27/2017 13:43   Dg Chest Port 1 View  Result Date: 08/27/2017 CLINICAL DATA:  Status post aortic valve replacement. Morbid obesity. EXAM: PORTABLE CHEST 1 VIEW COMPARISON:  August 25, 2017 FINDINGS: Cordis tip is in superior vena cava. Mediastinal drain has been removed. No pneumothorax. There are pleural effusions bilaterally, larger on the right than on the left. There is atelectatic change in the bases. There is cardiomegaly with mild pulmonary venous hypertension. No adenopathy. No bone lesions. IMPRESSION: Pulmonary vascular congestion with pleural effusions bilaterally. Bibasilar atelectasis. No frank edema or consolidation. No pneumothorax. Electronically Signed   By: Lowella Grip III M.D.   On: 08/27/2017 07:31   [place] Ekg Site Rite  Result Date: 08/27/2017 If Site Rite image not attached, placement could not be confirmed due to current cardiac rhythm.   Cardiac Studies   EF normal (pre op severe AS)  Patient Profile     47 y.o. male post op AFIB, AVR, RBBB new as well  Assessment & Plan    AFIB  - he continues to demonstrate AFIB with poor AV node conduction. When pacer turned off, pause. No change again today.   - IV  amiodarone has now been stopped by Dr. Roxy Manns (  on 4/8)  - Will require permanent pacemaker (5 days post op now). Will consult EP to see (saw Dr. Curt Bears over weekend).   - not on Bb.   RBBB  - new post op.   Hypokalemia  - 3.4, supplimenting.    For questions or updates, please contact [place] Please consult [email or web address] for contact info under Cardiology/STEMI.      Signed, Candee Furbish, MD  08/28/2017, 9:10 AM

## 2017-08-28 NOTE — Care Management Note (Signed)
Case Management Note Marvetta Gibbons RN, BSN Unit 4E-Case Manager-- [place] coverage [phone]  Patient Details  Name: Reginald [name] MRN: [id number] Date of Birth: [birth year]  Subjective/Objective:  Pt admitted s/p AVR, Thoracic ascending aneurysm repair (AAA)- Post op complicated by pacer dependence with afib and poor AV node conduction- per cards- will need PPM - EP to consult.                  Action/Plan: PTA pt lived at home, CM to follow for transition of care needs  Expected Discharge Date:                 Expected Discharge Plan:  [place]  In-House Referral:  NA  Discharge planning Services  CM Consult  Post Acute Care Choice:    Choice offered to:     DME Arranged:    DME Agency:     HH Arranged:    HH Agency:     Status of Service:  In process, will continue to follow  If discussed at Long Length of Stay Meetings, dates discussed:    Discharge Disposition:   Additional Comments:  Dawayne Patricia, RN 08/28/2017, 11:10 AM

## 2017-08-28 NOTE — Progress Notes (Signed)
OT Cancellation Note    08/28/17 1400  OT Visit Information  Last OT Received On 08/28/17  Reason Eval/Treat Not Completed Patient at procedure or test/ unavailable  Discussed use of leg compression garments earlier with nsg. Will plan to see tomorrow and most likey compression wrap BLE with ace wraps at this time. Will continue to follow.   [facility], OT/L  [phone] 08/28/2017

## 2017-08-28 NOTE — H&P (View-Only) (Signed)
Cardiology Consultation:   Patient ID: Reginald [name]; [id number]; [birth year]   Admit date: 08/23/2017 Date of Consult: 08/28/2017  Primary Care Provider: Monico Blitz, MD Primary Cardiologist: Kate Sable, MD  Primary Electrophysiologist:  New this admission to EP servic   Patient Profile:   Reginald [name] is a 47 y.o. male with a hx of HD w/bicuspid AV/severe AS now s/p AVR and ascending aneurysm repair, obseity, HTN, smoker/COPD, gout who is being seen today for the evaluation of bradycardia at the request of Dr. Marlou Porch.  History of Present Illness:   Reginald [name] was found in the out patient setting to have severe VHD/AS in w/u foir progressive DOE. He has no obstructive CAD and is now s/p Bioprosthetic AVR, ascending aortic aneurysm repair on 08/23/17 by Dr. Roxy Manns.  Post op noted with new RBBB and PAFib and started on amiodarone gtt 08/25/17 .  He was seen by Dr. Curt Bears 08/26/17 for cardiology service, his metoprolol was stopped with increased pacing burden on both, mentioning may need pacing though patient particularly hoping to avoid it.  Last dose of lopressor 12.5mg  BID was on 08/26/17 0949, amiodarone gtt ( I dont see a bolus given, was started 08/25/17 run initially 60/hr then 30 as usual, stopped yesterday at 0856.   No other rate liiting/nodal blocking agents are noted.  LABS K+ 3.4 being replaced  BUN/Creat 17/0.80 Mag 2.1 WBC 11.8 H/H 11/34 plts 210   Past Medical History:  Diagnosis Date  . Anxiety   . Aortic stenosis   . Aortic stenosis   . Asthma   . Back pain   . Cellulitis of skin with lymphangitis   . CHF (congestive heart failure) ([place])   . Chronic diastolic congestive heart failure ([place])   . Chronic venous insufficiency   . Constipation   . COPD (chronic obstructive pulmonary disease) ([place])   . Dyspnea   . Essential hypertension   . Gout   . Hypertension   . Morbid obesity ([place])   . Obesity   . Pneumonia    walking pneumonia  . Prostatitis   . Pulmonary  hypertension ([place])   . S/P aortic valve replacement with bioprosthetic valve 08/23/2017   25 mm Edwards Inspiris Resilia stented bovine pericardial tissue valve  . 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 Sleep apnea    cpap   . Thoracic ascending aortic aneurysm ([place])   . Thoracic ascending aortic aneurysm ([place])   . Tobacco abuse     Past Surgical History:  Procedure Laterality Date  . AORTIC VALVE REPLACEMENT N/A 08/23/2017   Procedure: AORTIC VALVE REPLACEMENT (AVR) USING INSPIRIS RESILIA AORTIC VALVE SIZE 25 MM;  Surgeon: Rexene Alberts, MD;  Location: [place];  Service: Open Heart Surgery;  Laterality: N/A;  . MULTIPLE EXTRACTIONS WITH ALVEOLOPLASTY N/A 07/23/2017   Procedure: Extraction of tooth #10 with alveoloplasty and gross debridement of remaining teeth;  Surgeon: Lenn Cal, DDS;  Location: [place];  Service: Oral Surgery;  Laterality: N/A;  . NO PAST SURGERIES    . TEE WITHOUT CARDIOVERSION N/A 07/16/2017   Procedure: TRANSESOPHAGEAL ECHOCARDIOGRAM (TEE);  Surgeon: Lelon Perla, MD;  Location[facility] ENDOSCOPY;  Service: Cardiovascular;  Laterality: N/A;  . TEE WITHOUT CARDIOVERSION N/A 08/23/2017   Procedure: TRANSESOPHAGEAL ECHOCARDIOGRAM (TEE);  Surgeon: Rexene Alberts, MD;  Location: [place];  Service: Open Heart Surgery;  Laterality: N/A;  . THORACIC AORTIC ANEURYSM REPAIR N/A 08/23/2017  Procedure: THORACIC ASCENDING ANEURYSM REPAIR (AAA) USING HEMASHIELD GOLD KNITTED MICROVEL DOUBLE VELOUR VASCULAR GRAFT D: 24 MM  L: 30 CM;  Surgeon: Rexene Alberts, MD;  Location: [place];  Service: Open Heart Surgery;  Laterality: N/A;      Inpatient Medications: Scheduled Meds: . acetaminophen  1,000 mg Oral Q6H  . allopurinol  300 mg Oral Daily  . aspirin EC  325 mg Oral Daily  . bisacodyl  10 mg Oral Daily   Or  . bisacodyl  10 mg Rectal Daily  . Chlorhexidine Gluconate Cloth  6 each Topical Daily  . Chlorhexidine Gluconate  Cloth  6 each Topical Daily  . citalopram  20 mg Oral QHS  . docusate sodium  200 mg Oral Daily  . enoxaparin (LOVENOX) injection  40 mg Subcutaneous QHS  . furosemide  40 mg Intravenous TID Reginald  . insulin aspart  0-15 Units Subcutaneous TID WC  . lisinopril  10 mg Oral Daily  . mouth rinse  15 mL Mouth Rinse BID  . pantoprazole  40 mg Oral Daily  . potassium chloride  20 mEq Oral Q4H  . potassium chloride  40 mEq Oral BID  . sodium chloride flush  10-40 mL Intracatheter Q12H  . sodium chloride flush  3 mL Intravenous Q12H  . spironolactone  25 mg Oral Daily   Continuous Infusions: . sodium chloride 250 mL (08/24/17 0550)  . potassium chloride 10 mEq (08/28/17 0958)   PRN Meds: clonazePAM, magnesium hydroxide, metoprolol tartrate, morphine injection, ondansetron (ZOFRAN) IV, oxyCODONE, sodium chloride flush, sodium chloride flush, sodium chloride flush, traMADol  Allergies:   No Known Allergies  Social History:   Social History   Socioeconomic History  . Marital status: Single    Spouse name: Not on file  . Number of children: Not on file  . Years of education: Not on file  . Highest education level: Not on file  Occupational History  . Not on file  Social Needs  . Financial resource strain: Not on file  . Food insecurity:    Worry: Not on file    Inability: Not on file  . Transportation needs:    Medical: Not on file    Non-medical: Not on file  Tobacco Use  . Smoking status: Current Every Day Smoker    Packs/day: 0.50    Types: Cigarettes  . Smokeless tobacco: Never Used  Substance and Sexual Activity  . Alcohol use: No    Frequency: Never    Comment: have had alcohol in the past, not heavy  . Drug use: No  . Sexual activity: Not on file  Lifestyle  . Physical activity:    Days per week: Not on file    Minutes per session: Not on file  . Stress: Not on file  Relationships  . Social connections:    Talks on phone: Not on file    Gets together: Not on file     Attends religious service: Not on file    Active member of club or organization: Not on file    Attends meetings of clubs or organizations: Not on file    Relationship status: Not on file  . Intimate partner violence:    Fear of current or ex partner: Not on file    Emotionally abused: Not on file    Physically abused: Not on file    Forced sexual activity: Not on file  Other Topics Concern  . Not on file  Social History  Narrative  . Not on file    Family History:   Family History  Problem Relation Age of Onset  . Hypertension Mother   . Alzheimer's disease Mother   . Heart attack Brother   . Diabetes Brother      ROS:  Please see the history of present illness.  All other ROS reviewed and negative.     Physical Exam/Data:   Vitals:   08/28/17 0754 08/28/17 0800 08/28/17 0900 08/28/17 1000  BP:  130/68 131/76   Pulse:  81 79 80  Resp:  (!) 21 19 (!) 21  Temp: 99.4 F (37.4 C)     TempSrc: Oral     SpO2:  98% 96% 97%  Weight:      Height:        Intake/Output Summary (Last 24 hours) at 08/28/2017 1027 Last data filed at 08/28/2017 1000 Gross per 24 hour  Intake 740 ml  Output 3450 ml  Net -2710 ml   Filed Weights   08/26/17 0600 08/27/17 0443 08/28/17 0500  Weight: (!) 430 lb 12.5 oz (195.4 kg) (!) 429 lb 14.4 oz (195 kg) (!) 426 lb 13 oz (193.6 kg)   Body mass index is 66.83 kg/m.  General: morbidly obese, in no acute distress, sitting bedside chair HEENT: normal Lymph: no adenopathy Neck: no JVD Endocrine:  No thryomegaly Vascular: No carotid bruits Cardiac: RRR (paced); soft SM, no gallops or rubs Lungs:  Diminished at the bases, no wheezing, rhonchi or rales  Abd: soft, nontender, obese Ext: 1+ edema, L forearm Mid line in place,  R forearm PICC in place Musculoskeletal:  No deformities Skin: warm and dry  Neuro:  No gross focal abnormalities noted Psych:  Normal affect   EKG:  The EKG was personally reviewed and demonstrates:  08/24/17: SR,  RBBB, 63bpm, PR 118ms, QRS 111ms 08/21/17 SR 64bpm, PR 163ms, QRS 181ms Telemetry:  Telemetry was personally reviewed and demonstrates:   PAFib >> remains in AFib, V paced with bursts of non sustained WCT, likely is AF  Relevant CV Studies:  07/16/17: R/LHC 1. No angiographic evidence of CAD 2. Severe aortic stenosis by TEE/TTE.  Due to turbulent flow in the ascending aorta and movement of the catheters, I could not cross the valve. The patient had a TEE this am that demonstrated severe AS. I did not feel that further attempts at crossing the valve would provide any clinical data that would change his treatment plan, though more attempts increased the risk of procedure complication. 3. Normal right heart pressures Recommendations: Will continue planning for AVR vs TAVR. Pt to f/u with Dr. Roxy Manns after CT scans.   07/16/17: TEE Study Conclusions - Left ventricle: Systolic function was normal. The estimated   ejection fraction was in the range of 55% to 60%. Wall motion was   normal; there were no regional wall motion abnormalities. - Aortic valve: Bicuspid; mildly thickened, mildly calcified   leaflets. There was severe stenosis. - Mitral valve: No evidence of vegetation. - Right atrium: No evidence of thrombus in the atrial cavity or   appendage. - Atrial septum: No defect or patent foramen ovale was identified. - Tricuspid valve: No evidence of vegetation. - Pulmonic valve: No evidence of vegetation. Impressions: - Normal LV systolic function; bicuspid aortic valve with severe AS   (mean gradient 46 mmHg); no AI; mildly dilated aortic root that   appears to be increasing in size in the ascending aorta but   difficult to fully  assess; suggest CTA to further evaluate.  Laboratory Data:  Chemistry Recent Labs  Lab 08/26/17 0445 08/27/17 0443 08/28/17 0409  NA 137 136 137  K 3.4* 3.3* 3.4*  CL 99* 99* 100*  CO2 27 26 28   GLUCOSE 114* 120* 103*  BUN 18 17 17   CREATININE 0.77 0.85  0.80  CALCIUM 8.3* 8.1* 8.4*  GFRNONAA >60 >60 >60  GFRAA >60 >60 >60  ANIONGAP 11 11 9     Recent Labs  Lab 08/21/17 1048  PROT 7.6  ALBUMIN 3.7  AST 29  ALT 25  ALKPHOS 79  BILITOT 0.5   Hematology Recent Labs  Lab 08/26/17 0445 08/27/17 0443 08/28/17 0409  WBC 13.0* 12.1* 11.8*  RBC 4.21* 4.31 4.18*  HGB 11.4* 11.3* 11.3*  HCT 35.0* 35.8* 34.8*  MCV 83.1 83.1 83.3  MCH 27.1 26.2 27.0  MCHC 32.6 31.6 32.5  RDW 15.8* 15.5 15.5  PLT 169 197 210   Cardiac EnzymesNo results for input(s): TROPONINI in the last 168 hours. No results for input(s): TROPIPOC in the last 168 hours.  BNPNo results for input(s): BNP, PROBNP in the last 168 hours.  DDimer No results for input(s): DDIMER in the last 168 hours.  Radiology/Studies:  Dg Chest Port 1 View Result Date: 08/27/2017 CLINICAL DATA:  Status post PICC placement. EXAM: PORTABLE CHEST 1 VIEW COMPARISON:  Radiograph of same day. FINDINGS: Stable cardiomegaly with central pulmonary vascular congestion. No pneumothorax is noted. Mild bibasilar subsegmental atelectasis is noted with probable pleural effusions. Interval placement of right-sided PICC line with distal tip in expected position of the SVC. Sternotomy wires are noted. Bony thorax is unremarkable. IMPRESSION: Stable cardiomegaly with central pulmonary vascular congestion. Interval placement of right-sided PICC line with distal tip in expected position of the SVC. Electronically Signed   By: Marijo Conception, M.D.   On: 08/27/2017 13:43     Assessment and Plan:   1. VHD, severe AS 2/2 Bicuspic valve now s/p Bioprosthetic AVR 08/23/17, Dr. Roxy Manns  2. Post op AFib     CHA2DS2Vasc is one  3. POD # 5 today requiring pacing support with his temp wires     New RBBB post op     New AFib post op     AFib rates 100-110's  He has been off BB for days, amio about 26 hours (despite short duration of gtt, in d/w pharmacy, IV half life 9 days This may be a tachy-brady scenario though  regardless. I wonder if trying to regain SR would be beneficial at this juncture to better assess conduction, he has some conducting AFib internmittently, though when pacing was reduced this AM was dependent and very symptomatic  Patient would like to discuss further with Dr. Rayann Heman, he is agreeable to consideratio of pacing.  4. Fluid OL     Diuresing, cumulatively is negative 4L   For questions or updates, please contact [place] Please consult [email or web address] for contact info under Cardiology/STEMI.   Signed, Baldwin [place], PA-C  08/28/2017 10:27 AM  I have seen, examined the patient, and reviewed the above assessment and plan.  Changes to above are made where necessary.  On exam, morbidly obese, NAD.RRR.  He has persistent symptomaticcomplete heart block.  He continues to require epicardial pacing POD #5 s/p AVR.  I would therefore recommend pacemaker implantation at this time.  Risks, benefits, alternatives to pacemaker implantation were discussed in detail with the patient today. The patient understands that the risks include but are  not limited to bleeding, infection, pneumothorax, perforation, tamponade, vascular damage, renal failure, MI, stroke, death,  and lead dislodgement and wishes to proceed at this time.  Given his size, risks of the procedure are increased.   Co Sign: Thompson Grayer, MD 08/28/2017 2:14 PM

## 2017-08-28 NOTE — Consult Note (Addendum)
Cardiology Consultation:   Patient ID: Reginald [name]; [id number]; August 24, 1970   Admit date: 08/23/2017 Date of Consult: 08/28/2017  Primary Care Provider: Monico Blitz, MD Primary Cardiologist: Kate Sable, MD  Primary Electrophysiologist:  New this admission to EP servic   Patient Profile:   Reginald [name] is a 47 y.o. male with a hx of HD w/bicuspid AV/severe AS now s/p AVR and ascending aneurysm repair, obseity, HTN, smoker/COPD, gout who is being seen today for the evaluation of bradycardia at the request of Dr. Marlou Porch.  History of Present Illness:   Mr. Roepke was found in the out patient setting to have severe VHD/AS in w/u foir progressive DOE. He has no obstructive CAD and is now s/p Bioprosthetic AVR, ascending aortic aneurysm repair on 08/23/17 by Dr. Roxy Manns.  Post op noted with new RBBB and PAFib and started on amiodarone gtt 08/25/17 .  He was seen by Dr. Curt Bears 08/26/17 for cardiology service, his metoprolol was stopped with increased pacing burden on both, mentioning may need pacing though patient particularly hoping to avoid it.  Last dose of lopressor 12.5mg  BID was on 08/26/17 0949, amiodarone gtt ( I dont see a bolus given, was started 08/25/17 run initially 60/hr then 30 as usual, stopped yesterday at 0856.   No other rate liiting/nodal blocking agents are noted.  LABS K+ 3.4 being replaced  BUN/Creat 17/0.80 Mag 2.1 WBC 11.8 H/H 11/34 plts 210   Past Medical History:  Diagnosis Date  . Anxiety   . Aortic stenosis   . Aortic stenosis   . Asthma   . Back pain   . Cellulitis of skin with lymphangitis   . CHF (congestive heart failure) ([place])   . Chronic diastolic congestive heart failure ([place])   . Chronic venous insufficiency   . Constipation   . COPD (chronic obstructive pulmonary disease) ([place])   . Dyspnea   . Essential hypertension   . Gout   . Hypertension   . Morbid obesity ([place])   . Obesity   . Pneumonia    walking pneumonia  . Prostatitis   . Pulmonary  hypertension ([place])   . S/P aortic valve replacement with bioprosthetic valve 08/23/2017   25 mm Edwards Inspiris Resilia stented bovine pericardial tissue valve  . 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 Sleep apnea    cpap   . Thoracic ascending aortic aneurysm ([place])   . Thoracic ascending aortic aneurysm ([place])   . Tobacco abuse     Past Surgical History:  Procedure Laterality Date  . AORTIC VALVE REPLACEMENT N/A 08/23/2017   Procedure: AORTIC VALVE REPLACEMENT (AVR) USING INSPIRIS RESILIA AORTIC VALVE SIZE 25 MM;  Surgeon: Rexene Alberts, MD;  Location: [facility];  Service: Open Heart Surgery;  Laterality: N/A;  . MULTIPLE EXTRACTIONS WITH ALVEOLOPLASTY N/A 07/23/2017   Procedure: Extraction of tooth #10 with alveoloplasty and gross debridement of remaining teeth;  Surgeon: Lenn Cal, DDS;  Location: [facility];  Service: Oral Surgery;  Laterality: N/A;  . NO PAST SURGERIES    . TEE WITHOUT CARDIOVERSION N/A 07/16/2017   Procedure: TRANSESOPHAGEAL ECHOCARDIOGRAM (TEE);  Surgeon: Lelon Perla, MD;  Location: [facility] ENDOSCOPY;  Service: Cardiovascular;  Laterality: N/A;  . TEE WITHOUT CARDIOVERSION N/A 08/23/2017   Procedure: TRANSESOPHAGEAL ECHOCARDIOGRAM (TEE);  Surgeon: Rexene Alberts, MD;  Location: [facility];  Service: Open Heart Surgery;  Laterality: N/A;  . THORACIC AORTIC ANEURYSM REPAIR N/A 08/23/2017  Procedure: THORACIC ASCENDING ANEURYSM REPAIR (AAA) USING HEMASHIELD GOLD KNITTED MICROVEL DOUBLE VELOUR VASCULAR GRAFT D: 24 MM  L: 30 CM;  Surgeon: Rexene Alberts, MD;  Location: [facility];  Service: Open Heart Surgery;  Laterality: N/A;      Inpatient Medications: Scheduled Meds: . acetaminophen  1,000 mg Oral Q6H  . allopurinol  300 mg Oral Daily  . aspirin EC  325 mg Oral Daily  . bisacodyl  10 mg Oral Daily   Or  . bisacodyl  10 mg Rectal Daily  . Chlorhexidine Gluconate Cloth  6 each Topical Daily  . Chlorhexidine Gluconate  Cloth  6 each Topical Daily  . citalopram  20 mg Oral QHS  . docusate sodium  200 mg Oral Daily  . enoxaparin (LOVENOX) injection  40 mg Subcutaneous QHS  . furosemide  40 mg Intravenous TID AC  . insulin aspart  0-15 Units Subcutaneous TID WC  . lisinopril  10 mg Oral Daily  . mouth rinse  15 mL Mouth Rinse BID  . pantoprazole  40 mg Oral Daily  . potassium chloride  20 mEq Oral Q4H  . potassium chloride  40 mEq Oral BID  . sodium chloride flush  10-40 mL Intracatheter Q12H  . sodium chloride flush  3 mL Intravenous Q12H  . spironolactone  25 mg Oral Daily   Continuous Infusions: . sodium chloride 250 mL (08/24/17 0550)  . potassium chloride 10 mEq (08/28/17 0958)   PRN Meds: clonazePAM, magnesium hydroxide, metoprolol tartrate, morphine injection, ondansetron (ZOFRAN) IV, oxyCODONE, sodium chloride flush, sodium chloride flush, sodium chloride flush, traMADol  Allergies:   No Known Allergies  Social History:   Social History   Socioeconomic History  . Marital status: Single    Spouse name: Not on file  . Number of children: Not on file  . Years of education: Not on file  . Highest education level: Not on file  Occupational History  . Not on file  Social Needs  . Financial resource strain: Not on file  . Food insecurity:    Worry: Not on file    Inability: Not on file  . Transportation needs:    Medical: Not on file    Non-medical: Not on file  Tobacco Use  . Smoking status: Current Every Day Smoker    Packs/day: 0.50    Types: Cigarettes  . Smokeless tobacco: Never Used  Substance and Sexual Activity  . Alcohol use: No    Frequency: Never    Comment: have had alcohol in the past, not heavy  . Drug use: No  . Sexual activity: Not on file  Lifestyle  . Physical activity:    Days per week: Not on file    Minutes per session: Not on file  . Stress: Not on file  Relationships  . Social connections:    Talks on phone: Not on file    Gets together: Not on file     Attends religious service: Not on file    Active member of club or organization: Not on file    Attends meetings of clubs or organizations: Not on file    Relationship status: Not on file  . Intimate partner violence:    Fear of current or ex partner: Not on file    Emotionally abused: Not on file    Physically abused: Not on file    Forced sexual activity: Not on file  Other Topics Concern  . Not on file  Social History  Narrative  . Not on file    Family History:   Family History  Problem Relation Age of Onset  . Hypertension Mother   . Alzheimer's disease Mother   . Heart attack Brother   . Diabetes Brother      ROS:  Please see the history of present illness.  All other ROS reviewed and negative.     Physical Exam/Data:   Vitals:   08/28/17 0754 08/28/17 0800 08/28/17 0900 08/28/17 1000  BP:  130/68 131/76   Pulse:  81 79 80  Resp:  (!) 21 19 (!) 21  Temp: 99.4 F (37.4 C)     TempSrc: Oral     SpO2:  98% 96% 97%  Weight:      Height:        Intake/Output Summary (Last 24 hours) at 08/28/2017 1027 Last data filed at 08/28/2017 1000 Gross per 24 hour  Intake 740 ml  Output 3450 ml  Net -2710 ml   Filed Weights   08/26/17 0600 08/27/17 0443 08/28/17 0500  Weight: (!) 430 lb 12.5 oz (195.4 kg) (!) 429 lb 14.4 oz (195 kg) (!) 426 lb 13 oz (193.6 kg)   Body mass index is 66.83 kg/m.  General: morbidly obese, in no acute distress, sitting bedside chair HEENT: normal Lymph: no adenopathy Neck: no JVD Endocrine:  No thryomegaly Vascular: No carotid bruits Cardiac: RRR (paced); soft SM, no gallops or rubs Lungs:  Diminished at the bases, no wheezing, rhonchi or rales  Abd: soft, nontender, obese Ext: 1+ edema, L forearm Mid line in place,  R forearm PICC in place Musculoskeletal:  No deformities Skin: warm and dry  Neuro:  No gross focal abnormalities noted Psych:  Normal affect   EKG:  The EKG was personally reviewed and demonstrates:  08/24/17: SR,  RBBB, 63bpm, PR 137ms, QRS 162ms 08/21/17 SR 64bpm, PR 113ms, QRS 119ms Telemetry:  Telemetry was personally reviewed and demonstrates:   PAFib >> remains in AFib, V paced with bursts of non sustained WCT, likely is AF  Relevant CV Studies:  07/16/17: R/LHC 1. No angiographic evidence of CAD 2. Severe aortic stenosis by TEE/TTE.  Due to turbulent flow in the ascending aorta and movement of the catheters, I could not cross the valve. The patient had a TEE this am that demonstrated severe AS. I did not feel that further attempts at crossing the valve would provide any clinical data that would change his treatment plan, though more attempts increased the risk of procedure complication. 3. Normal right heart pressures Recommendations: Will continue planning for AVR vs TAVR. Pt to f/u with Dr. Roxy Manns after CT scans.   07/16/17: TEE Study Conclusions - Left ventricle: Systolic function was normal. The estimated   ejection fraction was in the range of 55% to 60%. Wall motion was   normal; there were no regional wall motion abnormalities. - Aortic valve: Bicuspid; mildly thickened, mildly calcified   leaflets. There was severe stenosis. - Mitral valve: No evidence of vegetation. - Right atrium: No evidence of thrombus in the atrial cavity or   appendage. - Atrial septum: No defect or patent foramen ovale was identified. - Tricuspid valve: No evidence of vegetation. - Pulmonic valve: No evidence of vegetation. Impressions: - Normal LV systolic function; bicuspid aortic valve with severe AS   (mean gradient 46 mmHg); no AI; mildly dilated aortic root that   appears to be increasing in size in the ascending aorta but   difficult to fully  assess; suggest CTA to further evaluate.  Laboratory Data:  Chemistry Recent Labs  Lab 08/26/17 0445 08/27/17 0443 08/28/17 0409  NA 137 136 137  K 3.4* 3.3* 3.4*  CL 99* 99* 100*  CO2 27 26 28   GLUCOSE 114* 120* 103*  BUN 18 17 17   CREATININE 0.77 0.85  0.80  CALCIUM 8.3* 8.1* 8.4*  GFRNONAA >60 >60 >60  GFRAA >60 >60 >60  ANIONGAP 11 11 9     Recent Labs  Lab 08/21/17 1048  PROT 7.6  ALBUMIN 3.7  AST 29  ALT 25  ALKPHOS 79  BILITOT 0.5   Hematology Recent Labs  Lab 08/26/17 0445 08/27/17 0443 08/28/17 0409  WBC 13.0* 12.1* 11.8*  RBC 4.21* 4.31 4.18*  HGB 11.4* 11.3* 11.3*  HCT 35.0* 35.8* 34.8*  MCV 83.1 83.1 83.3  MCH 27.1 26.2 27.0  MCHC 32.6 31.6 32.5  RDW 15.8* 15.5 15.5  PLT 169 197 210   Cardiac EnzymesNo results for input(s): TROPONINI in the last 168 hours. No results for input(s): TROPIPOC in the last 168 hours.  BNPNo results for input(s): BNP, PROBNP in the last 168 hours.  DDimer No results for input(s): DDIMER in the last 168 hours.  Radiology/Studies:  Dg Chest Port 1 View Result Date: 08/27/2017 CLINICAL DATA:  Status post PICC placement. EXAM: PORTABLE CHEST 1 VIEW COMPARISON:  Radiograph of same day. FINDINGS: Stable cardiomegaly with central pulmonary vascular congestion. No pneumothorax is noted. Mild bibasilar subsegmental atelectasis is noted with probable pleural effusions. Interval placement of right-sided PICC line with distal tip in expected position of the SVC. Sternotomy wires are noted. Bony thorax is unremarkable. IMPRESSION: Stable cardiomegaly with central pulmonary vascular congestion. Interval placement of right-sided PICC line with distal tip in expected position of the SVC. Electronically Signed   By: Marijo Conception, M.D.   On: 08/27/2017 13:43     Assessment and Plan:   1. VHD, severe AS 2/2 Bicuspic valve now s/p Bioprosthetic AVR 08/23/17, Dr. Roxy Manns  2. Post op AFib     CHA2DS2Vasc is one  3. POD # 5 today requiring pacing support with his temp wires     New RBBB post op     New AFib post op     AFib rates 100-110's  He has been off BB for days, amio about 26 hours (despite short duration of gtt, in d/w pharmacy, IV half life 9 days This may be a tachy-brady scenario though  regardless. I wonder if trying to regain SR would be beneficial at this juncture to better assess conduction, he has some conducting AFib internmittently, though when pacing was reduced this AM was dependent and very symptomatic  Patient would like to discuss further with Dr. Rayann Heman, he is agreeable to consideratio of pacing.  4. Fluid OL     Diuresing, cumulatively is negative 4L   For questions or updates, please contact [place] Please consult [email or web address] for contact info under Cardiology/STEMI.   Signed, Baldwin [place], PA-C  08/28/2017 10:27 AM  I have seen, examined the patient, and reviewed the above assessment and plan.  Changes to above are made where necessary.  On exam, morbidly obese, NAD.RRR.  He has persistent symptomaticcomplete heart block.  He continues to require epicardial pacing POD #5 s/p AVR.  I would therefore recommend pacemaker implantation at this time.  Risks, benefits, alternatives to pacemaker implantation were discussed in detail with the patient today. The patient understands that the risks include but are  not limited to bleeding, infection, pneumothorax, perforation, tamponade, vascular damage, renal failure, MI, stroke, death,  and lead dislodgement and wishes to proceed at this time.  Given his size, risks of the procedure are increased.   Co Sign: Thompson Grayer, MD 08/28/2017 2:14 PM

## 2017-08-28 NOTE — Progress Notes (Signed)
PT Cancellation Note  Patient Details Name: Reginald [name] MRN: [id number] DOB: 07/26/1970   Cancelled Treatment:    Reason Eval/Treat Not Completed: (P) Patient at procedure or test/unavailable(To OR will f/u per POC.  )   Jerald Hennington Eli Hose 08/28/2017, 2:48 PM  Governor Rooks, PTA pager [phone]

## 2017-08-28 NOTE — Interval H&P Note (Signed)
History and Physical Interval Note:  08/28/2017 2:16 PM  Reginald [name]  has presented today for surgery, with the diagnosis of hb  The various methods of treatment have been discussed with the patient and family. After consideration of risks, benefits and other options for treatment, the patient has consented to  Procedure(s): PACEMAKER IMPLANT (N/A) as a surgical intervention .  [name] history has been reviewed, patient examined, no change in status, stable for surgery.  I have reviewed the patient's chart and labs.  Questions were answered to the patient's satisfaction.     Thompson Grayer

## 2017-08-28 NOTE — Progress Notes (Addendum)
[facility] DAILY ICU PROGRESS NOTE                   [address]            [place] [address]          [phone]   5 Days Post-Op Procedure(s) (LRB): AORTIC VALVE REPLACEMENT (AVR) USING INSPIRIS RESILIA AORTIC VALVE SIZE 25 MM (N/A) THORACIC ASCENDING ANEURYSM REPAIR (AAA) USING HEMASHIELD GOLD KNITTED MICROVEL DOUBLE VELOUR VASCULAR GRAFT D: 24 MM  L: 30 CM (N/A) TRANSESOPHAGEAL ECHOCARDIOGRAM (TEE) (N/A)  Total Length of Stay:  LOS: 5 days   Subjective: Remains pacer dependent  Objective: Vital signs in last 24 hours: Temp:  [97.6 F (36.4 C)-99.4 F (37.4 C)] 99.4 F (37.4 C) (04/09 0754) Pulse Rate:  [70-83] 79 (04/09 0700) Cardiac Rhythm: Ventricular paced (04/09 0400) Resp:  [16-28] 17 (04/09 0700) BP: (96-139)/(57-114) 127/114 (04/09 0700) SpO2:  [95 %-100 %] 98 % (04/09 0700) Weight:  [426 lb 13 oz (193.6 kg)] 426 lb 13 oz (193.6 kg) (04/09 0500)  Filed Weights   08/26/17 0600 08/27/17 0443 08/28/17 0500  Weight: (!) 430 lb 12.5 oz (195.4 kg) (!) 429 lb 14.4 oz (195 kg) (!) 426 lb 13 oz (193.6 kg)    Weight change: -3 lb 1.4 oz (-1.4 kg)   Hemodynamic parameters for last 24 hours:    Intake/Output from previous day: 04/08 0701 - 04/09 0700 In: 1036.5 [P.O.:730; I.V.:56.5; IV Piggyback:250] Out: 4742 [Urine:4050]  Intake/Output this shift: No intake/output data recorded.  Current Meds: Scheduled Meds: . acetaminophen  1,000 mg Oral Q6H  . allopurinol  300 mg Oral Daily  . aspirin EC  325 mg Oral Daily  . bisacodyl  10 mg Oral Daily   Or  . bisacodyl  10 mg Rectal Daily  . Chlorhexidine Gluconate Cloth  6 each Topical Daily  . Chlorhexidine Gluconate Cloth  6 each Topical Daily  . citalopram  20 mg Oral QHS  . docusate sodium  200 mg Oral Daily  . enoxaparin (LOVENOX) injection  40 mg Subcutaneous QHS  . furosemide  40 mg Intravenous TID AC  . insulin aspart  0-15 Units Subcutaneous TID WC  . lisinopril  10 mg Oral Daily  . mouth  rinse  15 mL Mouth Rinse BID  . pantoprazole  40 mg Oral Daily  . potassium chloride  20 mEq Oral Q4H  . potassium chloride  40 mEq Oral BID  . sodium chloride flush  10-40 mL Intracatheter Q12H  . sodium chloride flush  3 mL Intravenous Q12H  . spironolactone  25 mg Oral Daily   Continuous Infusions: . sodium chloride 250 mL (08/24/17 0550)  . potassium chloride     PRN Meds:.clonazePAM, metoprolol tartrate, morphine injection, ondansetron (ZOFRAN) IV, oxyCODONE, sodium chloride flush, sodium chloride flush, sodium chloride flush, traMADol  General appearance: alert, cooperative and no distress Heart: regular rate and rhythm and paced Lungs: clear to auscultation bilaterally Abdomen: soft, obese, nontender, + BS Extremities: no edema Wound: incis healing well  Lab Results: CBC: Recent Labs    08/27/17 0443 08/28/17 0409  WBC 12.1* 11.8*  HGB 11.3* 11.3*  HCT 35.8* 34.8*  PLT 197 210   BMET:  Recent Labs    08/27/17 0443 08/28/17 0409  NA 136 137  K 3.3* 3.4*  CL 99* 100*  CO2 26 28  GLUCOSE 120* 103*  BUN 17 17  CREATININE 0.85 0.80  CALCIUM 8.1* 8.4*  CMET: Lab Results  Component Value Date   WBC 11.8 (H) 08/28/2017   HGB 11.3 (L) 08/28/2017   HCT 34.8 (L) 08/28/2017   PLT 210 08/28/2017   GLUCOSE 103 (H) 08/28/2017   ALT 25 08/21/2017   AST 29 08/21/2017   NA 137 08/28/2017   K 3.4 (L) 08/28/2017   CL 100 (L) 08/28/2017   CREATININE 0.80 08/28/2017   BUN 17 08/28/2017   CO2 28 08/28/2017   INR 1.23 08/23/2017   HGBA1C 5.4 08/21/2017      PT/INR: No results for input(s): LABPROT, INR in the last 72 hours. Radiology: Dg Chest Port 1 View  Result Date: 08/27/2017 CLINICAL DATA:  Status post PICC placement. EXAM: PORTABLE CHEST 1 VIEW COMPARISON:  Radiograph of same day. FINDINGS: Stable cardiomegaly with central pulmonary vascular congestion. No pneumothorax is noted. Mild bibasilar subsegmental atelectasis is noted with probable pleural  effusions. Interval placement of right-sided PICC line with distal tip in expected position of the SVC. Sternotomy wires are noted. Bony thorax is unremarkable. IMPRESSION: Stable cardiomegaly with central pulmonary vascular congestion. Interval placement of right-sided PICC line with distal tip in expected position of the SVC. Electronically Signed   By: Marijo Conception, M.D.   On: 08/27/2017 13:43   [place] Ekg Site Rite  Result Date: 08/27/2017 If Site Rite image not attached, placement could not be confirmed due to current cardiac rhythm.    Assessment/Plan: S/P Procedure(s) (LRB): AORTIC VALVE REPLACEMENT (AVR) USING INSPIRIS RESILIA AORTIC VALVE SIZE 25 MM (N/A) THORACIC ASCENDING ANEURYSM REPAIR (AAA) USING HEMASHIELD GOLD KNITTED MICROVEL DOUBLE VELOUR VASCULAR GRAFT D: 24 MM  L: 30 CM (N/A) TRANSESOPHAGEAL ECHOCARDIOGRAM (TEE) (N/A)   1 doing well, will probably require PPM, Vpaced with pvc's 2 K+ Supplementation 3 renal fxn is normal- good UOP with increase of lasix for acute/chronic CHF- cont same for now. BP pretty stable on current RX 4 cont PT/mobilization, routine pulm toilet 5 add MOM for constipation  Betty Giovanni 08/28/2017 7:59 AM   I have seen and examined the patient and agree with the assessment and plan as outlined.  Looks like he will need a permanent pacemaker.  Rexene Alberts, MD 08/28/2017

## 2017-08-29 ENCOUNTER — Encounter (HOSPITAL_COMMUNITY): Payer: Self-pay | Admitting: Internal Medicine

## 2017-08-29 ENCOUNTER — Inpatient Hospital Stay (HOSPITAL_COMMUNITY): Payer: BLUE CROSS/BLUE SHIELD

## 2017-08-29 LAB — GLUCOSE, CAPILLARY
GLUCOSE-CAPILLARY: 90 mg/dL (ref 65–99)
GLUCOSE-CAPILLARY: 103 mg/dL — AB (ref 65–99)
GLUCOSE-CAPILLARY: 92 mg/dL (ref 65–99)
Glucose-Capillary: 88 mg/dL (ref 65–99)

## 2017-08-29 LAB — BASIC METABOLIC PANEL
ANION GAP: 12 (ref 5–15)
BUN: 19 mg/dL (ref 6–20)
CHLORIDE: 98 mmol/L — AB (ref 101–111)
CO2: 27 mmol/L (ref 22–32)
CREATININE: 0.89 mg/dL (ref 0.61–1.24)
Calcium: 8.5 mg/dL — ABNORMAL LOW (ref 8.9–10.3)
GFR calc non Af Amer: >60 mL/min (ref >60)
Glucose, Bld: 103 mg/dL — ABNORMAL HIGH (ref 65–99)
POTASSIUM: 3.8 mmol/L (ref 3.5–5.1)
SODIUM: 137 mmol/L (ref 135–145)

## 2017-08-29 MED ORDER — FUROSEMIDE 40 MG PO TABS
40.0000 mg | ORAL_TABLET | Freq: Every day | ORAL | Status: DC
  Administered 2017-08-30 – 2017-09-01 (×3): 40 mg via ORAL
  Filled 2017-08-29 (×3): qty 1

## 2017-08-29 MED ORDER — POTASSIUM CHLORIDE 10 MEQ/50ML IV SOLN
10.0000 meq | INTRAVENOUS | Status: DC | PRN
  Filled 2017-08-29: qty 50

## 2017-08-29 MED ORDER — POTASSIUM CHLORIDE 10 MEQ/50ML IV SOLN
10.0000 meq | INTRAVENOUS | Status: AC
  Administered 2017-08-29 (×3): 10 meq via INTRAVENOUS
  Filled 2017-08-29 (×3): qty 50

## 2017-08-29 MED ORDER — POTASSIUM CHLORIDE CRYS ER 20 MEQ PO TBCR: 40.0000 meq | EXTENDED_RELEASE_TABLET | Freq: Two times a day (BID) | ORAL | Status: DC

## 2017-08-29 MED FILL — Gentamicin Sulfate Inj 40 MG/ML: INTRAMUSCULAR | Qty: 80 | Status: AC

## 2017-08-29 NOTE — Progress Notes (Signed)
Patient ID: Reginald [name], male   DOB: 11-14-1970, 47 y.o.   MRN: [id number] EVENING ROUNDS NOTE :     [address]       [place] [address]             [phone]                 1 Day Post-Op Procedure(s) (LRB): PACEMAKER IMPLANT (N/A)  Total Length of Stay:  LOS: 6 days  BP 134/72   Pulse 70   Temp 99 F (37.2 C) (Axillary)   Resp (!) 25   Ht 5' 7.01" (1.702 m)   Wt (!) 422 lb 6.4 oz (191.6 kg)   SpO2 100%   BMI 66.14 kg/m   .Intake/Output      04/09 0701 - 04/10 0700 04/10 0701 - 04/11 0700   P.O. 350 840   I.V. (mL/kg)     IV Piggyback 250 150   Total Intake(mL/kg) 600 (3.1) 990 (5.2)   Urine (mL/kg/hr) 2025 (0.4) 1375 (0.6)   Stool 0    Total Output 2025 1375   Net -1425 -385        Urine Occurrence 6 x    Stool Occurrence 4 x      . sodium chloride 250 mL (08/24/17 0550)  . sodium chloride    . potassium chloride       Lab Results  Component Value Date   WBC 11.8 (H) 08/28/2017   HGB 11.3 (L) 08/28/2017   HCT 34.8 (L) 08/28/2017   PLT 210 08/28/2017   GLUCOSE 103 (H) 08/29/2017   ALT 25 08/21/2017   AST 29 08/21/2017   NA 137 08/29/2017   K 3.8 08/29/2017   CL 98 (L) 08/29/2017   CREATININE 0.89 08/29/2017   BUN 19 08/29/2017   CO2 27 08/29/2017   INR 1.23 08/23/2017   HGBA1C 5.4 08/21/2017   Stable day Pacer functioning Waiting for bed stepdown   Grace Isaac MD  Beeper [phone] Office [phone] 08/29/2017 6:40 PM

## 2017-08-29 NOTE — Progress Notes (Signed)
Occupational Therapy Treatment Patient Details Name: Reginald [name] MRN: [id number] DOB: [birth year] Today's Date: 08/29/2017    History of present illness Pt is a 47 y.o. male with PMH of HTN, HF, COPD, morbid obesity, pulmonary HTN, and aortic stenosis, admitted 08/23/17 now s/p aortic valve replacement and AAA repair; developed R bundle branch block postop. Pt also in a-fib with intermittent ventricular pacing from his temporary pacing wires. S/p pacemaker placement 4/9.    OT comments  Pt measured for compression stockings - 3XL TEDs are being sent over from [facility]. Also educated on pacemaker precautions for LUE retrictions and impact on ADL. Pt concerned about how he is going to "take care of himself". Began discussion on use of AE to manage ADL tasks. Will follow up to teach pt compensatory techniques and use of AE.   Follow Up Recommendations  Home health OT;Supervision - Intermittent    Equipment Recommendations  3 in 1 bedside commode(AE)    Recommendations for Other Services      Precautions / Restrictions Precautions Precautions: Sternal Precaution Comments: pacemeaker precuations  - placement 4/9       Mobility Bed Mobility                  Transfers                      Balance                                           ADL either performed or assessed with clinical judgement   ADL                                         General ADL Comments: Educated pt on Pacemaker precautions regarding LUE. Reviewed sternal precatuions. Pt asking "how am I goingot do this. Pt will most likely need a toilet aid, eracher, sock aid, shoe horn for LB ADL adn a dressing stick for UB ADL.  Pt also discussed his LE edema and need for compression stockings. Pt measured and will need 3XL size. Discussed need to follow up with DME store after DC for further compression needs. Pt verbalized understanding.      Vision       Perception      Praxis      Cognition Arousal/Alertness: Awake/alert Behavior During Therapy: WFL for tasks assessed/performed Overall Cognitive Status: Within Functional Limits for tasks assessed                                          Exercises     Shoulder Instructions       General Comments Measurements of calves R 60 cm; L 63 cm; ankle R 29.5 cm; L 29 cm Size needed in 3XL fits 51-71 cm calves    Pertinent Vitals/ Pain       Pain Assessment: Faces Faces Pain Scale: Hurts little more Pain Location: chest; L upper chest area Pain Descriptors / Indicators: Discomfort;Grimacing Pain Intervention(s): Limited activity within patient's tolerance  Home Living  Prior Functioning/Environment              Frequency  Min 3X/week        Progress Toward Goals  OT Goals(current goals can now be found in the care plan section)  Progress towards OT goals: Progressing toward goals  Acute Rehab OT Goals Patient Stated Goal: to be able to take car of myself OT Goal Formulation: With patient Time For Goal Achievement: 09/10/17 Potential to Achieve Goals: Good ADL Goals Pt Will Perform Upper Body Bathing: with set-up;with supervision;sitting Pt Will Perform Lower Body Bathing: sit to/from stand;with set-up;with supervision;with adaptive equipment Pt Will Perform Upper Body Dressing: with supervision;with set-up;sitting Pt Will Perform Lower Body Dressing: with supervision;with set-up;sit to/from stand;with adaptive equipment Pt Will Transfer to Toilet: with modified independence;bedside commode;ambulating Pt Will Perform Toileting - Clothing Manipulation and hygiene: with set-up;with supervision;with adaptive equipment;sit to/from stand;sitting/lateral leans Additional ADL Goal #1: Pt will independently verbalize understanding of precuations regarding pacemaker placement and impact on ADL  Plan Discharge plan  remains appropriate;Frequency needs to be updated    Co-evaluation                 AM-PAC PT "6 Clicks" Daily Activity     Outcome Measure   Help from another person eating meals?: None Help from another person taking care of personal grooming?: A Little Help from another person toileting, which includes using toliet, bedpan, or urinal?: A Lot Help from another person bathing (including washing, rinsing, drying)?: A Lot Help from another person to put on and taking off regular upper body clothing?: A Lot Help from another person to put on and taking off regular lower body clothing?: A Lot 6 Click Score: 15    End of Session    OT Visit Diagnosis: Unsteadiness on feet (R26.81);Muscle weakness (generalized) (M62.81);Pain Pain - part of body: (chest)   Activity Tolerance     Patient Left     Nurse Communication          Time: 5852-7782 OT Time Calculation (min): 20 min  Charges: OT General Charges $OT Visit: 1 Visit OT Treatments $Therapeutic Activity: 8-22 mins  [facility], OT/L  [phone] 08/29/2017   Reginald [name],Reginald [name] 08/29/2017, 2:01 PM

## 2017-08-29 NOTE — Progress Notes (Signed)
Physical Therapy Treatment Patient Details Name: Reginald [name] MRN: [id number] DOB: [birth year] Today's Date: 08/29/2017    History of Present Illness Pt is a 47 y.o. male with PMH of HTN, HF, COPD, morbid obesity, pulmonary HTN, and aortic stenosis, admitted 08/23/17 now s/p aortic valve replacement and AAA repair; developed R bundle branch block postop. Pt also in a-fib with intermittent ventricular pacing from his temporary pacing wires. S/p pacemaker placement 4/9.    PT Comments    Pt progressing well with mobility. Able to transfer and amb 530' with Harmon Pier walker and supervision for safety. Good ability to maintain sternal/pacemaker precautions. Pt easily fatigued with DOE throughout amb, requiring multiple standing rest breaks. Amb in room with no UE support and supervision. Will plan for gait training with bariatric rollator next session for stability and energy conservation.   Follow Up Recommendations  Home health PT;Supervision/Assistance - 24 hour     Equipment Recommendations  Other (comment)(bariatric rollator)    Recommendations for Other Services OT consult     Precautions / Restrictions Precautions Precautions: Sternal;ICD/Pacemaker Precaution Comments: Verbally reviewed precautions Restrictions Other Position/Activity Restrictions: sternal/pacemaker precautions    Mobility  Bed Mobility               General bed mobility comments: Received ambulating with RN  Transfers Overall transfer level: Needs assistance Equipment used: 4-wheeled walker(Eva walker) Transfers: Sit to/from Stand Sit to Stand: Supervision         General transfer comment: Good ability to maintain precautions sitting with no UE support; good eccentric control  Ambulation/Gait Ambulation/Gait assistance: Supervision Ambulation Distance (Feet): 530 Feet Assistive device: 4-wheeled walker(Eva walker) Gait Pattern/deviations: Step-through pattern;Decreased stride length Gait velocity:  Decreased Gait velocity interpretation: Below normal speed for age/gender General Gait Details: Slow, controlled amb with Harmon Pier walker and supervision for safety. Pt very conversant throughout session with DOE and respiration rate >30, requiring multiple standing rest breaks; verbal reminders for pursed lip breathing. SpO2 92% on RA   Stairs            Wheelchair Mobility    Modified Rankin (Stroke Patients Only)       Balance Overall balance assessment: Needs assistance Sitting-balance support: Feet supported;No upper extremity supported Sitting balance-Leahy Scale: Good     Standing balance support: No upper extremity supported Standing balance-Leahy Scale: Fair Standing balance comment: Able to amb in room with no UE support; stability improved with UE support                            Cognition Arousal/Alertness: Awake/alert Behavior During Therapy: WFL for tasks assessed/performed Overall Cognitive Status: Within Functional Limits for tasks assessed                                        Exercises      General Comments General comments (skin integrity, edema, etc.): Measurements of calves R 60 cm; L 63 cm; ankle R 29.5 cm; L 29 cm Size needed in 3XL fits 51-71 cm calves      Pertinent Vitals/Pain Pain Assessment: Faces Faces Pain Scale: Hurts little more Pain Location: Chest incision site Pain Descriptors / Indicators: Sore Pain Intervention(s): Monitored during session    Home Living                      Prior  Function            PT Goals (current goals can now be found in the care plan section) Acute Rehab PT Goals Patient Stated Goal: to be able to take care of myself PT Goal Formulation: With patient Time For Goal Achievement: 09/09/17 Potential to Achieve Goals: Good Progress towards PT goals: Progressing toward goals    Frequency    Min 3X/week      PT Plan Current plan remains  appropriate;Equipment recommendations need to be updated    Co-evaluation              AM-PAC PT "6 Clicks" Daily Activity  Outcome Measure  Difficulty turning over in bed (including adjusting bedclothes, sheets and blankets)?: Unable Difficulty moving from lying on back to sitting on the side of the bed? : Unable Difficulty sitting down on and standing up from a chair with arms (e.g., wheelchair, bedside commode, etc,.)?: A Little Help needed moving to and from a bed to chair (including a wheelchair)?: A Little Help needed walking in hospital room?: A Little Help needed climbing 3-5 steps with a railing? : A Lot 6 Click Score: 13    End of Session Equipment Utilized During Treatment: Gait belt Activity Tolerance: Patient tolerated treatment well Patient left: in chair;with call bell/phone within reach Nurse Communication: Mobility status PT Visit Diagnosis: Unsteadiness on feet (R26.81);Other abnormalities of gait and mobility (R26.89);Difficulty in walking, not elsewhere classified (R26.2);Pain Pain - part of body: (sternum)     Time: 1400-1420 PT Time Calculation (min) (ACUTE ONLY): 20 min  Charges:  $Gait Training: 8-22 mins                    G Codes:      Mabeline Caras, PT, DPT Acute Rehab Services  Pager: Vivian 08/29/2017, 3:45 PM

## 2017-08-29 NOTE — Progress Notes (Addendum)
Progress Note  Patient Name: Reginald [name] Date of Encounter: 08/29/2017  Primary Cardiologist: Kate Sable, MD   Subjective   Generally tired, minimal discomfort at pacer site, no CP or SOB.  Inpatient Medications    Scheduled Meds: . allopurinol  300 mg Oral Daily  . amiodarone  400 mg Oral BID  . aspirin EC  325 mg Oral Daily  . bisacodyl  10 mg Oral Daily   Or  . bisacodyl  10 mg Rectal Daily  . Chlorhexidine Gluconate Cloth  6 each Topical Daily  . Chlorhexidine Gluconate Cloth  6 each Topical Daily  . citalopram  20 mg Oral QHS  . docusate sodium  200 mg Oral Daily  . furosemide  40 mg Oral Daily  . insulin aspart  0-15 Units Subcutaneous TID WC  . lisinopril  10 mg Oral Daily  . mouth rinse  15 mL Mouth Rinse BID  . pantoprazole  40 mg Oral Daily  . potassium chloride  40 mEq Oral BID  . sodium chloride flush  3 mL Intravenous Q12H  . spironolactone  25 mg Oral Daily   Continuous Infusions: . sodium chloride 250 mL (08/24/17 0550)  . sodium chloride    . potassium chloride    . potassium chloride 10 mEq (08/29/17 0949)   PRN Meds: sodium chloride, acetaminophen, clonazePAM, HYDROcodone-acetaminophen, magnesium hydroxide, metoprolol tartrate, morphine injection, ondansetron (ZOFRAN) IV, oxyCODONE, potassium chloride, sodium chloride flush, sodium chloride flush, traMADol   Vital Signs    Vitals:   08/29/17 0700 08/29/17 0757 08/29/17 0805 08/29/17 0900  BP: 128/67  114/85 138/78  Pulse: (!) 57  69 70  Resp: (!) 22  (!) 21 (!) 23  Temp:  98.7 F (37.1 C)    TempSrc:  Oral    SpO2: 96%  100% 98%  Weight:      Height:        Intake/Output Summary (Last 24 hours) at 08/29/2017 0951 Last data filed at 08/29/2017 7209 Gross per 24 hour  Intake 790 ml  Output 2900 ml  Net -2110 ml   Filed Weights   08/27/17 0443 08/28/17 0500 08/29/17 0500  Weight: (!) 429 lb 14.4 oz (195 kg) (!) 426 lb 13 oz (193.6 kg) (!) 422 lb 6.4 oz (191.6 kg)     Telemetry    Generally paced rhythm, has some AFib conducting intermittently - Personally Reviewed  ECG    V paced - Personally Reviewed  Physical Exam   GEN: No acute distress.   Neck: No JVD Cardiac: RRR (paced), no murmurs, rubs, or gallops.  Respiratory: diinished at the bases. GI: Soft, nontender, obese  MS: No appreciable pitting edema; No deformity. Neuro:  Nonfocal  Psych: Normal affect   Labs    Chemistry Recent Labs  Lab 08/27/17 0443 08/28/17 0409 08/28/17 1817 08/29/17 0200  NA 136 137  --  137  K 3.3* 3.4* 3.9 3.8  CL 99* 100*  --  98*  CO2 26 28  --  27  GLUCOSE 120* 103*  --  103*  BUN 17 17  --  19  CREATININE 0.85 0.80  --  0.89  CALCIUM 8.1* 8.4*  --  8.5*  GFRNONAA >60 >60  --  >60  GFRAA >60 >60  --  >60  ANIONGAP 11 9  --  12     Hematology Recent Labs  Lab 08/26/17 0445 08/27/17 0443 08/28/17 0409  WBC 13.0* 12.1* 11.8*  RBC 4.21* 4.31 4.18*  HGB 11.4* 11.3* 11.3*  HCT 35.0* 35.8* 34.8*  MCV 83.1 83.1 83.3  MCH 27.1 26.2 27.0  MCHC 32.6 31.6 32.5  RDW 15.8* 15.5 15.5  PLT 169 197 210    Cardiac EnzymesNo results for input(s): TROPONINI in the last 168 hours. No results for input(s): TROPIPOC in the last 168 hours.   BNPNo results for input(s): BNP, PROBNP in the last 168 hours.   DDimer No results for input(s): DDIMER in the last 168 hours.   Radiology    Dg Chest 2 View Result Date: 08/29/2017 CLINICAL DATA:  Status post pacemaker placement EXAM: CHEST - 2 VIEW COMPARISON:  08/27/2017 FINDINGS: Cardiac shadow is enlarged but stable. Pacing device is noted new from the prior exam. No pneumothorax is seen. Postsurgical changes are noted. Right-sided PICC line is stable. Mild right pleural effusion is seen. IMPRESSION: No pneumothorax following pacemaker placement. Stable right pleural effusion. Electronically Signed   By: Inez Catalina M.D.   On: 08/29/2017 08:10     Cardiac Studies   07/16/17: R/LHC 1. No angiographic  evidence of CAD 2. Severe aortic stenosis by TEE/TTE. Due to turbulent flow in the ascending aorta and movement of the catheters, I could not cross the valve. The patient had a TEE this am that demonstrated severe AS. I did not feel that further attempts at crossing the valve would provide any clinical data that would change his treatment plan, though more attempts increased the risk of procedure complication. 3. Normal right heart pressures Recommendations: Will continue planning for AVR vs TAVR. Pt to f/u with Dr. Roxy Manns after CT scans.   07/16/17: TEE Study Conclusions - Left ventricle: Systolic function was normal. The estimated ejection fraction was in the range of 55% to 60%. Wall motion was normal; there were no regional wall motion abnormalities. - Aortic valve: Bicuspid; mildly thickened, mildly calcified leaflets. There was severe stenosis. - Mitral valve: No evidence of vegetation. - Right atrium: No evidence of thrombus in the atrial cavity or appendage. - Atrial septum: No defect or patent foramen ovale was identified. - Tricuspid valve: No evidence of vegetation. - Pulmonic valve: No evidence of vegetation. Impressions: - Normal LV systolic function; bicuspid aortic valve with severe AS (mean gradient 46 mmHg); no AI; mildly dilated aortic root that appears to be increasing in size in the ascending aorta but difficult to fully assess; suggest CTA to further evaluate.   Patient Profile     47 y.o. male with a hx of HD w/bicuspid AV/severe AS now s/p AVR and ascending aneurysm repair, obseity, HTN, smoker/COPD, gout with persistent post-op CHB now s/p PPM  Assessment & Plan    1. VHD, severe AS 2/2 Bicuspic valve now s/p Bioprosthetic AVR 08/23/17, Dr. Roxy Manns  2. Post op AFib     CHA2DS2Vasc is one     Deferred to surgical service a/c   3. Yesterday, POD # 5 was noted with persistent CHB >> PPM yesterday     New RBBB post op     New AFib post op back on  amiodarone      S/p PPM yesterday with Dr. Rayann Heman Site is stable, dry, no hematoma Wound care was discussed with the patient LUE activity restrictions discussed with the patient PPM check this morning with intact function, no R waves at 30bpm CXR this morning without ptx  Please remove picc line ASAP  4. Fluid OL     Diuresing, cumulatively is negative 4L    For questions  or updates, please contact [facility] Please consult [email or web address] for contact info under Cardiology/STEMI.      Signed, Baldwin Jamaica, PA-C  08/29/2017, 9:51 AM    I have seen, examined the patient, and reviewed the above assessment and plan.  Changes to above are made where necessary.  On exam, RRR.  No hematoma.  Device interrogation is personally reviewed and normal.  CXR reveals stable leads, no ptx.  Co Sign: Thompson Grayer, MD 08/29/2017 5:17 PM

## 2017-08-29 NOTE — Progress Notes (Addendum)
[facility] DAILY ICU PROGRESS NOTE                   [address]            [place] [address]          [phone]   1 Day Post-Op Procedure(s) (LRB): PACEMAKER IMPLANT (N/A)  Total Length of Stay:  LOS: 6 days   Subjective: Feels pretty well but tired," I feel dehydrated", + BM, urine is"dark" Pacer placed yesterday  Objective: Vital signs in last 24 hours: Temp:  [97.9 F (36.6 C)-98.7 F (37.1 C)] 98.7 F (37.1 C) (04/10 0757) Pulse Rate:  [49-109] 57 (04/10 0700) Cardiac Rhythm: Ventricular paced (04/10 0405) Resp:  [9-26] 22 (04/10 0700) BP: (85-142)/(55-97) 128/67 (04/10 0700) SpO2:  [92 %-100 %] 96 % (04/10 0700) Weight:  [422 lb 6.4 oz (191.6 kg)] 422 lb 6.4 oz (191.6 kg) (04/10 0500)  Filed Weights   08/27/17 0443 08/28/17 0500 08/29/17 0500  Weight: (!) 429 lb 14.4 oz (195 kg) (!) 426 lb 13 oz (193.6 kg) (!) 422 lb 6.4 oz (191.6 kg)    Weight change: -4 lb 6.6 oz (-2 kg)   Hemodynamic parameters for last 24 hours:    Intake/Output from previous day: 04/09 0701 - 04/10 0700 In: 600 [P.O.:350; IV Piggyback:250] Out: 2025 [Urine:2025]  Intake/Output this shift: No intake/output data recorded.  Current Meds: Scheduled Meds: . allopurinol  300 mg Oral Daily  . amiodarone  400 mg Oral BID  . aspirin EC  325 mg Oral Daily  . bisacodyl  10 mg Oral Daily   Or  . bisacodyl  10 mg Rectal Daily  . Chlorhexidine Gluconate Cloth  6 each Topical Daily  . Chlorhexidine Gluconate Cloth  6 each Topical Daily  . citalopram  20 mg Oral QHS  . docusate sodium  200 mg Oral Daily  . furosemide  40 mg Intravenous TID AC  . insulin aspart  0-15 Units Subcutaneous TID WC  . lisinopril  10 mg Oral Daily  . mouth rinse  15 mL Mouth Rinse BID  . pantoprazole  40 mg Oral Daily  . potassium chloride  40 mEq Oral BID  . sodium chloride flush  3 mL Intravenous Q12H  . spironolactone  25 mg Oral Daily   Continuous Infusions: . sodium chloride 250 mL  (08/24/17 0550)  . sodium chloride    .  ceFAZolin (ANCEF) IV 1 g (08/29/17 0810)   PRN Meds:.sodium chloride, acetaminophen, clonazePAM, HYDROcodone-acetaminophen, magnesium hydroxide, metoprolol tartrate, morphine injection, ondansetron (ZOFRAN) IV, oxyCODONE, sodium chloride flush, sodium chloride flush, traMADol  General appearance: alert, cooperative and no distress Heart: regular rate and rhythm Lungs: dim right>left lower fields Abdomen: obese, nontender Extremities: + mild edema Wound: incis healing well  Lab Results: CBC: Recent Labs    08/27/17 0443 08/28/17 0409  WBC 12.1* 11.8*  HGB 11.3* 11.3*  HCT 35.8* 34.8*  PLT 197 210   BMET:  Recent Labs    08/28/17 0409 08/28/17 1817 08/29/17 0200  NA 137  --  137  K 3.4* 3.9 3.8  CL 100*  --  98*  CO2 28  --  27  GLUCOSE 103*  --  103*  BUN 17  --  19  CREATININE 0.80  --  0.89  CALCIUM 8.4*  --  8.5*    CMET: Lab Results  Component Value Date   WBC 11.8 (H) 08/28/2017   HGB 11.3 (L) 08/28/2017  HCT 34.8 (L) 08/28/2017   PLT 210 08/28/2017   GLUCOSE 103 (H) 08/29/2017   ALT 25 08/21/2017   AST 29 08/21/2017   NA 137 08/29/2017   K 3.8 08/29/2017   CL 98 (L) 08/29/2017   CREATININE 0.89 08/29/2017   BUN 19 08/29/2017   CO2 27 08/29/2017   INR 1.23 08/23/2017   HGBA1C 5.4 08/21/2017      PT/INR: No results for input(s): LABPROT, INR in the last 72 hours. Radiology: Dg Chest 2 View  Result Date: 08/29/2017 CLINICAL DATA:  Status post pacemaker placement EXAM: CHEST - 2 VIEW COMPARISON:  08/27/2017 FINDINGS: Cardiac shadow is enlarged but stable. Pacing device is noted new from the prior exam. No pneumothorax is seen. Postsurgical changes are noted. Right-sided PICC line is stable. Mild right pleural effusion is seen. IMPRESSION: No pneumothorax following pacemaker placement. Stable right pleural effusion. Electronically Signed   By: Inez Catalina M.D.   On: 08/29/2017 08:10      Assessment/Plan: S/P Procedure(s) (LRB): PACEMAKER IMPLANT (N/A)  1 doing well after pacer, vpaced with PVC's- now placed on amio. EPW's to be removed today 2 may need to decrease lasix dosing, right pleural effusion is stable on CXR. Difficult to fully assess edema with morbid obesity. Renal fxn cont to be normal 3 now having BM 4 cont to mobilize/PT  5 home probably in next couple days    Reginald [name] 08/29/2017 8:14 AM   I have seen and examined the patient and agree with the assessment and plan as outlined.   Oral amiodarone restarted last night after brief episode rate-controlled Afib.  Currently sinus w/ appropriate Vpacing.  Will need warfarin or DOAC if Afib recurs but he may stop now that perm pacer in place.  Transfer 4E.  Possible d/c home 1-2 days if rhythm stable and he otherwise continues to do well.  Rexene Alberts, MD 08/29/2017 9:14 AM

## 2017-08-30 ENCOUNTER — Ambulatory Visit (HOSPITAL_COMMUNITY): Payer: Self-pay | Admitting: Nurse Practitioner

## 2017-08-30 DIAGNOSIS — I493 Ventricular premature depolarization: Secondary | ICD-10-CM

## 2017-08-30 DIAGNOSIS — I481 Persistent atrial fibrillation: Secondary | ICD-10-CM

## 2017-08-30 LAB — COMPREHENSIVE METABOLIC PANEL
ALT: 26 U/L (ref 17–63)
AST: 27 U/L (ref 15–41)
Albumin: 2.9 g/dL — ABNORMAL LOW (ref 3.5–5.0)
Alkaline Phosphatase: 85 U/L (ref 38–126)
Anion gap: 11 (ref 5–15)
BUN: 22 mg/dL — ABNORMAL HIGH (ref 6–20)
CHLORIDE: 101 mmol/L (ref 101–111)
CO2: 24 mmol/L (ref 22–32)
CREATININE: 1.06 mg/dL (ref 0.61–1.24)
Calcium: 8.8 mg/dL — ABNORMAL LOW (ref 8.9–10.3)
Glucose, Bld: 107 mg/dL — ABNORMAL HIGH (ref 65–99)
POTASSIUM: 4.7 mmol/L (ref 3.5–5.1)
Sodium: 136 mmol/L (ref 135–145)
Total Bilirubin: 0.7 mg/dL (ref 0.3–1.2)
Total Protein: 6.8 g/dL (ref 6.5–8.1)

## 2017-08-30 LAB — GLUCOSE, CAPILLARY
GLUCOSE-CAPILLARY: 95 mg/dL (ref 65–99)
GLUCOSE-CAPILLARY: 119 mg/dL — AB (ref 65–99)
Glucose-Capillary: 98 mg/dL (ref 65–99)

## 2017-08-30 LAB — CBC
HCT: 39.2 % (ref 39.0–52.0)
Hemoglobin: 12.4 g/dL — ABNORMAL LOW (ref 13.0–17.0)
MCH: 26.6 pg (ref 26.0–34.0)
MCHC: 31.6 g/dL (ref 30.0–36.0)
MCV: 84.1 fL (ref 78.0–100.0)
PLATELETS: 304 10*3/uL (ref 150–400)
RBC: 4.66 MIL/uL (ref 4.22–5.81)
RDW: 15.8 % — AB (ref 11.5–15.5)
WBC: 13.4 10*3/uL — ABNORMAL HIGH (ref 4.0–10.5)

## 2017-08-30 LAB — MAGNESIUM: MAGNESIUM: 2.3 mg/dL (ref 1.7–2.4)

## 2017-08-30 MED ORDER — METOPROLOL TARTRATE 25 MG PO TABS
25.0000 mg | ORAL_TABLET | Freq: Once | ORAL | Status: AC
  Administered 2017-08-30: 25 mg via ORAL
  Filled 2017-08-30: qty 1

## 2017-08-30 MED ORDER — METOPROLOL TARTRATE 25 MG PO TABS
25.0000 mg | ORAL_TABLET | Freq: Two times a day (BID) | ORAL | Status: DC
  Administered 2017-08-30: 25 mg via ORAL
  Filled 2017-08-30: qty 1

## 2017-08-30 MED ORDER — METOPROLOL SUCCINATE ER 50 MG PO TB24
50.0000 mg | ORAL_TABLET | Freq: Every day | ORAL | Status: DC
  Filled 2017-08-30: qty 1

## 2017-08-30 MED ORDER — METOPROLOL SUCCINATE ER 50 MG PO TB24: 50.0000 mg | ORAL_TABLET | Freq: Every day | ORAL | Status: DC

## 2017-08-30 MED ORDER — METOPROLOL TARTRATE 12.5 MG HALF TABLET: 12.5000 mg | ORAL_TABLET | Freq: Two times a day (BID) | ORAL | Status: DC

## 2017-08-30 NOTE — Discharge Summary (Signed)
Physician Discharge Summary  Patient ID: Reginald [name] MRN: [id number] DOB/AGE: 1971-01-12 47 y.o.  Admit date: 08/23/2017 Discharge date: 09/01/2017  Admission Diagnoses: Severe aortic stenosis  Discharge Diagnoses:  Principal Problem:   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 Active Problems:   Aortic stenosis   Thoracic ascending aortic aneurysm (HCC)   Morbid obesity (HCC)   Essential hypertension   Chronic diastolic congestive heart failure (HCC)   Severe aortic stenosis   Pulmonary hypertension (HCC)   S/P ascending aortic replacement  Patient Active Problem List   Diagnosis Date Noted  . 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 08/23/2017  . 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 Pulmonary hypertension ([place])   . Chronic periodontitis 07/18/2017  . Severe aortic stenosis   . Aortic stenosis   . Thoracic ascending aortic aneurysm ([place])   . Morbid obesity ([place])   . Essential hypertension   . Tobacco abuse   . Chronic diastolic congestive heart failure ([place])   . Chronic venous insufficiency    HPI:  The patient is a 47 year old male who is known to have a long-standing heart murmur, chronic diastolic congestive heart failure, hypertension, chronic venous insufficiency, obstructive sleep apnea on CPAP, and long-standing tobacco abuse with COPD who was referred to Dr. Roxy Manns for surgical opinion regarding treatment options for the management of suspected bicuspid aortic valve with severe aortic stenosis.  The patient states he has had this heart murmur for the the entirety of his life.  He is morbidly obese but describes a long history of symptoms of exertional shortness of breath which has been significantly progressive over the past 6 months.  A TEE done by Dr. Jacinta Shoe in December 2018 showed the presence of severe aortic stenosis with normal left  ventricular systolic function.  Due to these findings he was referred to Dr. Roxy Manns for surgical opinion.  He was originally seen in consultation on June 25, 2017.  Since then he underwent a TEE and diagnostic cardiac catheterization on July 16, 2017.  TEE confirmed the presence of bicuspid aortic valve with severe aortic stenosis.  A mean transvalvular gradient across the aortic valve was estimated at 46 mmHg.  Left ventricular systolic function appeared normal with ejection fraction estimated at 55-60%.  The ascending aorta appeared dilated but it was difficult to image.  No other significant abnormalities were noted.  Catheterization was notable for the absence of significant coronary artery disease.  The aortic valve could not be crossed.  There were normal right sided pressures.  A subsequent CT angiogram and pulmonary function tests were done.  There was notable enlargement of the ascending aorta.  After full review of the patient history and physical as well as the studies Dr. Roxy Manns recommended surgical intervention and he was admitted this hospitalization for the procedure.   Discharged Condition: good  Hospital Course: Patient was admitted electively on 08/23/2017 taken the operating room where he underwent the below described procedure.  He tolerated it well and was taken to the surgical intensive care unit in stable condition.  Postoperative hospital course:  Overall the patient has progressed nicely.  He has maintained stable hemodynamics and was weaned from support including lines and drains without difficulty.  He was extubated without difficulty using standard protocols.  He did develop postoperative complete heart block and EP consultation was obtained.  He was monitored closely but ultimately it was determined that he would  best be served with proceeding with placement of permanent pacemaker which was done on 08/28/2017 by Dr. Rayann Heman.  He tolerated it well and was placed back into the SICU.   He is continued to make good progress.  He does have a mild expected acute blood loss anemia which is stabilized.  His renal function is maintained normal status and he has a volume overload that has responded well to diuretics.  Incisions are noted to be healing well without evidence of infection.  He has had some postoperative intermittent atrial fibrillation which is being monitored.  He is placed on amiodarone.  Additionally he is placed on a beta-blocker.  Patient has been placed on Coumadin.  Physical therapy has assisted with mobilization due to his morbid obese status and he is showing steady improvement.  Oxygen has been weaned.  He does use CPAP at night.  His blood sugars have been under adequate control.  At time of discharge the patient is felt to be quite stable.  Consults: Electrophysiology for placement of permanent pacemaker  Significant Diagnostic Studies: routine post op labs and serial CXR's  Treatments: surgery:    PPM by Dr Rayann Heman on 08/28/2117   CARDIOTHORACIC SURGERY OPERATIVE NOTE  Date of Procedure:                08/23/2017  Preoperative Diagnosis:        Bicuspid Aortic Valve  Severe Aortic Stenosis   Ascending Thoracic Aortic Aneurysm  Postoperative Diagnosis:    Same   Procedure:        Aortic Valve Replacement             Edwards Inspiris Resilia Stented Bovine Pericardial Tissue Valve (size 25 mm, ref # [id number], serial # [id number])   Ascending Aortic Aneurysm Repair             Hemashield Eliza Green supracoronary straight graft (size 24 mm, cat # [id number], lot # [id number])               Surgeon:        Valentina Gu. Roxy Manns, MD  Assistant:       Riese Giovanni, PA-C  Anesthesia:    Albertha Ghee, MD  Operative Findings: ? Sievers type 0 bicuspid aortic valve ? Severe aortic stenosis ? Mild aortic insufficiency ? Ascending thoracic aortic aneurysm ? Normal left ventricular systolic function   Discharge Exam: Blood pressure (!) 150/78, pulse 75,  temperature 98.4 F (36.9 C), temperature source Oral, resp. rate (!) 22, height 5' 7.01" (1.702 m), weight (!) 428 lb 12.7 oz (194.5 kg), SpO2 97 %.   General appearance: alert, cooperative and no distress Heart: regular rate and rhythm Lungs: mildly dim in bases Abdomen: obese, benign Extremities: no change Wound: incis healing well, no sternal instability or drainage    Disposition: Discharge disposition: 01-Home or Self Care       Discharge Instructions    Discharge patient   Complete by:  As directed    Discharge disposition:  01-Home or Self Care   Discharge patient date:  09/01/2017     Allergies as of 09/01/2017   No Known Allergies     Medication List    STOP taking these medications   amLODipine 10 MG tablet Commonly known as:  NORVASC   ibuprofen 800 MG tablet Commonly known as:  ADVIL,MOTRIN     TAKE these medications   albuterol 108 (90 Base) MCG/ACT inhaler Commonly known as:  PROVENTIL HFA;VENTOLIN HFA Inhale 2  puffs into the lungs every 6 (six) hours as needed for wheezing or shortness of breath.   allopurinol 300 MG tablet Commonly known as:  ZYLOPRIM Take 300 mg by mouth daily.   amiodarone 200 MG tablet Commonly known as:  PACERONE Take 1 tablet (200 mg total) by mouth 2 (two) times daily.   aspirin 325 MG EC tablet Take 1 tablet (325 mg total) by mouth daily. What changed:    medication strength  how much to take  when to take this   chlorhexidine 0.12 % solution Commonly known as:  PERIDEX Rinse with 15 mls twice daily for 30 seconds. Use after breakfast and at bedtime. Spit out excess. Do not swallow.   citalopram 20 MG tablet Commonly known as:  CELEXA Take 20 mg by mouth at bedtime.   clonazePAM 1 MG tablet Commonly known as:  KLONOPIN Take 0.5-1 mg by mouth every 8 (eight) hours as needed for anxiety.   docusate sodium 100 MG capsule Commonly known as:  COLACE Take 200 mg by mouth daily.   furosemide 40 MG  tablet Commonly known as:  LASIX Take 1 tablet (40 mg total) by mouth daily. MAY TAKE AN EXTRA 40 MG AS NEEDED What changed:    how much to take  additional instructions   ipratropium 0.02 % nebulizer solution Commonly known as:  ATROVENT Take 0.5 mg by nebulization every 6 (six) hours as needed for wheezing or shortness of breath.   lisinopril-hydrochlorothiazide 20-25 MG tablet Commonly known as:  PRINZIDE,ZESTORETIC Take 1 tablet by mouth daily.   metoprolol succinate 100 MG 24 hr tablet Commonly known as:  TOPROL-XL Take 1 tablet (100 mg total) by mouth daily. Take with or immediately following a meal.   oxyCODONE 5 MG immediate release tablet Commonly known as:  Oxy IR/ROXICODONE Take 1-2 tablets (5-10 mg total) by mouth every 6 (six) hours as needed for up to 7 days for severe pain.   potassium chloride 10 MEQ tablet Commonly known as:  K-DUR,KLOR-CON Take 10 mEq by mouth daily.   PROBIOTIC PO Take 1 capsule by mouth at bedtime.   warfarin 5 MG tablet Commonly known as:  COUMADIN Take 1 tablet (5 mg total) by mouth daily at 6 PM. As directed by the coumadin clinic            Durable Medical Equipment  (From admission, onward)        Start     Ordered   08/31/17 1027  For home use only DME Shower stool  Once    Comments:  426 pounds, will need bariatric model   08/31/17 1027   08/31/17 1026  For home use only DME 4 wheeled rolling walker with seat  Once    Comments:  426 pounds. Will need bariatric model  Question:  Patient needs a walker to treat with the following condition  Answer:  Weakness generalized   08/31/17 1027     Follow-up Information    [facility]. Call on 09/07/2017.   Specialty:  Cardiology Why:  10:30AM. wound check visit (pacemaker) Contact information: [address] [place]       Thompson Grayer, MD Follow up on 11/28/2017.   Specialty:  Cardiology Why:   4:00PM Contact information: [place] [address] [phone]        Herminio Commons, MD Follow up.   Specialty:  Cardiology Why:  Please  see discharge paperwork for 2-week cardiology follow-up appointment instructions. Contact information: [place] [address] [phone]        Rexene Alberts, MD Follow up.   Specialty:  Cardiothoracic Surgery Why:  Appointment to see Dr. Roxy Manns on 10/08/2017 at 2 PM.  Please obtain a chest x-ray at [facility] at 1:30 PM.  [facility] imaging is located in the same office complex on the first floor. Contact information: [address] [place] [address] [phone]          The patient has been discharged on:   1.Beta Blocker:  Yes Blue.Reese   ]                              No   [   ]                              If No, reason:  2.Ace Inhibitor/ARB: Yes [ y]                                     No  [    ]                                     If No, reason:  3.Statin:   Yes [   ]                  No  [n   ]                  If No, reason:no indication  4.Ecasa:  Yes  Blue.Reese   ]                  No   [   ]                  If No, reason:  Signed: Wilder Glade Kjerstin Abrigo 09/01/2017, 9:07 AM

## 2017-08-30 NOTE — Progress Notes (Signed)
Progress Note   Subjective   Doing well today, the patient denies CP or SOB.  No new concerns  Inpatient Medications    Scheduled Meds: . allopurinol  300 mg Oral Daily  . amiodarone  400 mg Oral BID  . aspirin EC  325 mg Oral Daily  . bisacodyl  10 mg Oral Daily   Or  . bisacodyl  10 mg Rectal Daily  . Chlorhexidine Gluconate Cloth  6 each Topical Daily  . Chlorhexidine Gluconate Cloth  6 each Topical Daily  . citalopram  20 mg Oral QHS  . docusate sodium  200 mg Oral Daily  . furosemide  40 mg Oral Daily  . insulin aspart  0-15 Units Subcutaneous TID WC  . lisinopril  10 mg Oral Daily  . mouth rinse  15 mL Mouth Rinse BID  . metoprolol succinate  50 mg Oral Daily  . pantoprazole  40 mg Oral Daily  . potassium chloride  40 mEq Oral BID  . sodium chloride flush  3 mL Intravenous Q12H  . spironolactone  25 mg Oral Daily   Continuous Infusions: . sodium chloride 250 mL (08/24/17 0550)  . sodium chloride    . potassium chloride     PRN Meds: sodium chloride, acetaminophen, clonazePAM, HYDROcodone-acetaminophen, magnesium hydroxide, metoprolol tartrate, morphine injection, ondansetron (ZOFRAN) IV, oxyCODONE, potassium chloride, sodium chloride flush, sodium chloride flush, traMADol   Vital Signs    Vitals:   08/30/17 0421 08/30/17 0500 08/30/17 0756 08/30/17 0800  BP:   120/64 121/72  Pulse:   63 68  Resp:   (!) 23 (!) 29  Temp: 97.8 F (36.6 C)  98.8 F (37.1 C)   TempSrc: Axillary  Oral   SpO2:   97% 99%  Weight:  (!) 426 lb 9.6 oz (193.5 kg)    Height:        Intake/Output Summary (Last 24 hours) at 08/30/2017 1027 Last data filed at 08/30/2017 0300 Gross per 24 hour  Intake 650 ml  Output 900 ml  Net -250 ml   Filed Weights   08/28/17 0500 08/29/17 0500 08/30/17 0500  Weight: (!) 426 lb 13 oz (193.6 kg) (!) 422 lb 6.4 oz (191.6 kg) (!) 426 lb 9.6 oz (193.5 kg)    Telemetry    afib with V pacing,  Occasional RVR noted.  PVCs, nonsustained VT also  noted - Personally Reviewed  Physical Exam   GEN- The patient is morbidly obese appearing, alert and oriented x 3 today.   Head- normocephalic, atraumatic Eyes-  Sclera clear, conjunctiva pink Ears- hearing intact Oropharynx- clear Neck- supple, Lungs- Clear to ausculation bilaterally, normal work of breathing Heart- irregular rate and rhythm  GI- soft, NT, ND, + BS Extremities- no clubbing, cyanosis, + dependant edema  MS- no significant deformity or atrophy Skin- no rash or lesion Psych- euthymic mood, full affect Neuro- strength and sensation are intact   Labs    Chemistry Recent Labs  Lab 08/28/17 0409 08/28/17 1817 08/29/17 0200 08/30/17 0455  NA 137  --  137 136  K 3.4* 3.9 3.8 4.7  CL 100*  --  98* 101  CO2 28  --  27 24  GLUCOSE 103*  --  103* 107*  BUN 17  --  19 22*  CREATININE 0.80  --  0.89 1.06  CALCIUM 8.4*  --  8.5* 8.8*  PROT  --   --   --  6.8  ALBUMIN  --   --   --  2.9*  AST  --   --   --  27  ALT  --   --   --  26  ALKPHOS  --   --   --  85  BILITOT  --   --   --  0.7  GFRNONAA >60  --  >60 >60  GFRAA >60  --  >60 >60  ANIONGAP 9  --  12 11     Hematology Recent Labs  Lab 08/27/17 0443 08/28/17 0409 08/30/17 0455  WBC 12.1* 11.8* 13.4*  RBC 4.31 4.18* 4.66  HGB 11.3* 11.3* 12.4*  HCT 35.8* 34.8* 39.2  MCV 83.1 83.3 84.1  MCH 26.2 27.0 26.6  MCHC 31.6 32.5 31.6  RDW 15.5 15.5 15.8*  PLT 197 210 304    Cardiac EnzymesNo results for input(s): TROPONINI in the last 168 hours. No results for input(s): TROPIPOC in the last 168 hours.      Assessment & Plan    1.  Persistent afib (post op) Remains in afib.  At times, having RVR Will start on toprol 50mg  daily.  Can adjust as needed chads2vasc score is at least 1 (HTN) and possibly 2 given some degree of glucose intolerance post operatively. Continue rate control for now.  Would initiate coumadin when ok with surgical team with close outpatient follow-up.  Anticoagulation could be  followed in [place] office with Edrick Oh.  I would also advise follow-up in AF clinic 3-5 days post discharge (I will arrange).  2. PVCs, nonsustained VT Preserved EF Hopefully will continue to improve as he recovers toprol started (as above)  3. Second degree AV block Occasional AV conduction with RVR noted S/p PPM Device wound check is scheduled for 09/07/17    Thompson Grayer MD, [facility] 08/30/2017 10:27 AM

## 2017-08-30 NOTE — Progress Notes (Signed)
Physical Therapy Treatment Patient Details Name: Reginald [name] MRN: [id number] DOB: May 05, 1971 Today's Date: 08/30/2017    History of Present Illness Pt is a 47 y.o. male with PMH of HTN, HF, COPD, morbid obesity, pulmonary HTN, and aortic stenosis, admitted 08/23/17 now s/p aortic valve replacement and AAA repair; developed R bundle branch block postop. Pt also in a-fib with intermittent ventricular pacing from his temporary pacing wires. S/p pacemaker placement 4/9.    PT Comments    Pt has made great progress with mobility. Mod indep ambulating 750' with bariatric rollator; required 1x seated rest break on rollator and intermittent standing rest breaks secondary to DOE. SpO2 >90% on RA. HR 70-103 with amb. Continues to have difficulty with bed mobility in bariatric bed; reports he plans to sleep in recliner upon return home. Will have 24/7 support at home. All questions answered and education completed. Pt with good awareness of sternal/pacemaker precautions. Recommend initial follow-up with HHPT services, and potential progression to outpatient Cardiac Rehab. Will d/c acute PT.   Follow Up Recommendations  Home health PT;Supervision/Assistance - 24 hour     Equipment Recommendations  Other (comment)(bariatric rollator)    Recommendations for Other Services       Precautions / Restrictions Precautions Precautions: Sternal;ICD/Pacemaker Precaution Comments: Verbally reviewed precautions Restrictions Weight Bearing Restrictions: Yes Other Position/Activity Restrictions: sternal/pacemaker precautions    Mobility  Bed Mobility               General bed mobility comments: Received sitting in recliner. Pt reports requiring some assist from RN in current bariatric bed. States he plans to sleep in recilner at home (has someone to assist with getting OOB at home if needed)  Transfers Overall transfer level: Modified independent Equipment used: 4-wheeled walker(bariatric  rollator) Transfers: Sit to/from Stand Sit to Stand: Modified independent (Device/Increase time)         General transfer comment: Good technique locking rollator brakes; no cues required  Ambulation/Gait Ambulation/Gait assistance: Modified independent (Device/Increase time) Ambulation Distance (Feet): 750 Feet Assistive device: 4-wheeled walker(bariatric rollator) Gait Pattern/deviations: Step-through pattern;Decreased stride length Gait velocity: Decreased Gait velocity interpretation: Below normal speed for age/gender General Gait Details: Slow, steady amb mod indep with rollator. Pt with intermittent DOE requiring 1x seated rest breaks (good technique locking rollator brakes) and intermittent standing rest breaks. SpO2 >90% on RA   Stairs Stairs: (Declined. Pt feels comfortable ascending threshold into home)          Wheelchair Mobility    Modified Rankin (Stroke Patients Only)       Balance Overall balance assessment: Needs assistance Sitting-balance support: Feet supported;No upper extremity supported Sitting balance-Leahy Scale: Good     Standing balance support: No upper extremity supported Standing balance-Leahy Scale: Good Standing balance comment: Cab amb throughout room with no UE support                            Cognition Arousal/Alertness: Awake/alert Behavior During Therapy: WFL for tasks assessed/performed Overall Cognitive Status: Within Functional Limits for tasks assessed                                        Exercises      General Comments        Pertinent Vitals/Pain Pain Assessment: Faces Faces Pain Scale: Hurts a little bit Pain Location: Chest incision site  Pain Descriptors / Indicators: Sore Pain Intervention(s): Monitored during session    Home Living                      Prior Function            PT Goals (current goals can now be found in the care plan section) Acute Rehab PT  Goals PT Goal Formulation: All assessment and education complete, DC therapy Progress towards PT goals: Goals met/education completed, patient discharged from PT    Frequency    Min 3X/week      PT Plan Current plan remains appropriate;Equipment recommendations need to be updated    Co-evaluation              AM-PAC PT "6 Clicks" Daily Activity  Outcome Measure  Difficulty turning over in bed (including adjusting bedclothes, sheets and blankets)?: Unable Difficulty moving from lying on back to sitting on the side of the bed? : Unable Difficulty sitting down on and standing up from a chair with arms (e.g., wheelchair, bedside commode, etc,.)?: None Help needed moving to and from a bed to chair (including a wheelchair)?: None Help needed walking in hospital room?: None Help needed climbing 3-5 steps with a railing? : A Little 6 Click Score: 17    End of Session   Activity Tolerance: Patient tolerated treatment well Patient left: in chair;with call bell/phone within reach Nurse Communication: Mobility status PT Visit Diagnosis: Unsteadiness on feet (R26.81);Other abnormalities of gait and mobility (R26.89);Difficulty in walking, not elsewhere classified (R26.2);Pain     Time: 1100-1127 PT Time Calculation (min) (ACUTE ONLY): 27 min  Charges:  $Gait Training: 8-22 mins $Therapeutic Activity: 8-22 mins                    G Codes:      Mabeline Caras, PT, DPT Acute Rehab Services  Pager: [place] 08/30/2017, 11:34 AM

## 2017-08-30 NOTE — Plan of Care (Signed)
  Problem: Safety: Goal: Ability to remain free from injury will improve Outcome: Progressing   

## 2017-08-30 NOTE — Progress Notes (Addendum)
Reginald [name]       Reginald [name] [id number]             [phone]      2 Days Post-Op Procedure(s) (LRB): PACEMAKER IMPLANT (N/A) Subjective: Feels pretty well, some tachycardia , ?afib, + PVC's   Objective: Vital signs in last 24 hours: Temp:  [97.8 F (36.6 C)-99 F (37.2 C)] 97.8 F (36.6 C) (04/11 0421) Pulse Rate:  [69-78] 78 (04/11 0400) Cardiac Rhythm: Atrial fibrillation;Ventricular paced (04/11 0400) Resp:  [20-25] 22 (04/11 0400) BP: (114-138)/(64-86) 120/64 (04/11 0400) SpO2:  [94 %-100 %] 94 % (04/11 0400) Weight:  [426 lb 9.6 oz (193.5 kg)] 426 lb 9.6 oz (193.5 kg) (04/11 0500)  Hemodynamic parameters for last 24 hours:    Intake/Output from previous day: 04/10 0701 - 04/11 0700 In: 1230 [P.O.:1080; IV Piggyback:150] Out: 1975 [Urine:1975] Intake/Output this shift: No intake/output data recorded.  General appearance: alert, cooperative and no distress Heart: regular rate and rhythm Lungs: dim in lower fields Abdomen: obese, benign Extremities: some LE edema Wound: incis healing well  Lab Results: Recent Labs    08/28/17 0409 08/30/17 0455  WBC 11.8* 13.4*  HGB 11.3* 12.4*  HCT 34.8* 39.2  PLT 210 304   BMET:  Recent Labs    08/29/17 0200 08/30/17 0455  NA 137 136  K 3.8 4.7  CL 98* 101  CO2 27 24  GLUCOSE 103* 107*  BUN 19 22*  CREATININE 0.89 1.06  CALCIUM 8.5* 8.8*    PT/INR: No results for input(s): LABPROT, INR in the last 72 hours. ABG    Component Value Date/Time   PHART 7.436 08/24/2017 0332   HCO3 27.0 08/24/2017 0332   TCO2 26 08/24/2017 1828   ACIDBASEDEF 2.0 08/23/2017 1858   O2SAT 93.0 08/24/2017 0332   CBG (last 3)  Recent Labs    08/29/17 1136 08/29/17 1630 08/29/17 2212  GLUCAP 103* 92 88    Meds Scheduled Meds: . allopurinol  300 mg Oral Daily  . amiodarone  400 mg Oral BID  . aspirin EC  325 mg Oral Daily  . bisacodyl  10 mg Oral Daily   Or  . bisacodyl  10 mg Rectal Daily  .  Chlorhexidine Gluconate Cloth  6 each Topical Daily  . Chlorhexidine Gluconate Cloth  6 each Topical Daily  . citalopram  20 mg Oral QHS  . docusate sodium  200 mg Oral Daily  . furosemide  40 mg Oral Daily  . insulin aspart  0-15 Units Subcutaneous TID WC  . lisinopril  10 mg Oral Daily  . mouth rinse  15 mL Mouth Rinse BID  . pantoprazole  40 mg Oral Daily  . potassium chloride  40 mEq Oral BID  . sodium chloride flush  3 mL Intravenous Q12H  . spironolactone  25 mg Oral Daily   Continuous Infusions: . sodium chloride 250 mL (08/24/17 0550)  . sodium chloride    . potassium chloride     PRN Meds:.sodium chloride, acetaminophen, clonazePAM, HYDROcodone-acetaminophen, magnesium hydroxide, metoprolol tartrate, morphine injection, ondansetron (ZOFRAN) IV, oxyCODONE, potassium chloride, sodium chloride flush, sodium chloride flush, traMADol  Xrays Dg Chest 2 View  Result Date: 08/29/2017 CLINICAL DATA:  Status post pacemaker placement EXAM: CHEST - 2 VIEW COMPARISON:  08/27/2017 FINDINGS: Cardiac shadow is enlarged but stable. Pacing device is noted new from the prior exam. No pneumothorax is seen. Postsurgical changes are noted. Right-sided PICC line is stable. Mild  right pleural effusion is seen. IMPRESSION: No pneumothorax following pacemaker placement. Stable right pleural effusion. Electronically Signed   By: Inez Catalina M.D.   On: 08/29/2017 08:10    Assessment/Plan: S/P Procedure(s) (LRB): PACEMAKER IMPLANT (N/A)  1 rhythm management- pacer working well, on amio, may need to consider betablocker- defer to EP 2 tmax 99, slight increase in leukocytosis- cont to push mobilization and pulm toilet. Incisions currently look ok 3 H/H improved 4 renal fxn ok, conts to make good UOP on current diuretic 5 awaiting bed on 4E 6 poss home tomorrow   LOS: 7 days    Reginald [name] 08/30/2017 patient examined and medical record reviewed,agree with above note.Will resume low dose beta  blocker for PACs, no coumadin Reginald [name] 08/30/2017

## 2017-08-30 NOTE — Progress Notes (Signed)
Occupational Therapy Treatment Patient Details Name: Reginald [name] MRN: [id number] DOB: [birth year] Today's Date: 08/30/2017    History of present illness Pt is a 47 y.o. male with PMH of HTN, HF, COPD, morbid obesity, pulmonary HTN, and aortic stenosis, admitted 08/23/17 now s/p aortic valve replacement and AAA repair; developed R bundle branch block postop. Pt also in a-fib with intermittent ventricular pacing from his temporary pacing wires. S/p pacemaker placement 4/9.    OT comments  Focus of session on ADL retraining with use of AE and DME. Pt requiring min A with use of AE and will benefit from another session with OT prior to DC. Pt will need a [employer] Shower seat (appropriate for over #400) and a wide rollator. Pt very appreciative and states he feels "so much better about taking care of himself".   Follow Up Recommendations  Home health OT;Supervision - Intermittent    Equipment Recommendations  Tub/shower seat;Other (comment)([employer] shower chair; wide rollator)    Recommendations for Other Services      Precautions / Restrictions Precautions Precautions: Sternal;ICD/Pacemaker       Mobility Bed Mobility                  Transfers Overall transfer level: Modified independent                    Balance                                           ADL either performed or assessed with clinical judgement   ADL Overall ADL's : Needs assistance/impaired     Grooming: Set up   Upper Body Bathing: Set up;Sitting   Lower Body Bathing: Minimal assistance;Sit to/from stand   Upper Body Dressing : Minimal assistance;Sitting   Lower Body Dressing: Minimal assistance;Sit to/from stand   Toilet Transfer: Modified Independent   Toileting- Clothing Manipulation and Hygiene: Minimal assistance       Functional mobility during ADLs: Supervision/safety(using rollator) General ADL Comments: Educated on use of AE for LB ADL. Will benefit from  further education. Need to address washing underarms/deoderant following precautions. More difficulty with behind the back ADL. Ae helps increase independence. Needs carex showerchair adn rollator     Vision       Perception     Praxis      Cognition Arousal/Alertness: Awake/alert Behavior During Therapy: WFL for tasks assessed/performed Overall Cognitive Status: Within Functional Limits for tasks assessed                                          Exercises     Shoulder Instructions       General Comments      Pertinent Vitals/ Pain       Pain Assessment: 0-10 Pain Score: 4  Pain Location: chest/middle back; incision Pain Descriptors / Indicators: Sore Pain Intervention(s): Limited activity within patient's tolerance  Home Living                                          Prior Functioning/Environment              Frequency  Min 3X/week  Progress Toward Goals  OT Goals(current goals can now be found in the care plan section)  Progress towards OT goals: Progressing toward goals  Acute Rehab OT Goals Patient Stated Goal: to be able to take care of myself OT Goal Formulation: With patient Time For Goal Achievement: 09/10/17 Potential to Achieve Goals: Good ADL Goals Pt Will Perform Upper Body Bathing: with set-up;with supervision;sitting Pt Will Perform Lower Body Bathing: sit to/from stand;with set-up;with supervision;with adaptive equipment Pt Will Perform Upper Body Dressing: with supervision;with set-up;sitting Pt Will Perform Lower Body Dressing: with supervision;with set-up;sit to/from stand;with adaptive equipment Pt Will Transfer to Toilet: with modified independence;bedside commode;ambulating Pt Will Perform Toileting - Clothing Manipulation and hygiene: with set-up;with supervision;with adaptive equipment;sit to/from stand;sitting/lateral leans Additional ADL Goal #1: Pt will independently verbalize  understanding of precuations regarding pacemaker placement and impact on ADL  Plan Discharge plan remains appropriate;Frequency needs to be updated    Co-evaluation                 AM-PAC PT "6 Clicks" Daily Activity     Outcome Measure   Help from another person eating meals?: None Help from another person taking care of personal grooming?: A Little Help from another person toileting, which includes using toliet, bedpan, or urinal?: A Little Help from another person bathing (including washing, rinsing, drying)?: A Little Help from another person to put on and taking off regular upper body clothing?: A Little Help from another person to put on and taking off regular lower body clothing?: A Little 6 Click Score: 19    End of Session Equipment Utilized During Treatment: Other (comment)(rollator)  OT Visit Diagnosis: Unsteadiness on feet (R26.81);Muscle weakness (generalized) (M62.81);Pain Pain - part of body: (chest/ L shoulder)   Activity Tolerance Patient tolerated treatment well   Patient Left in chair;with call bell/phone within reach   Nurse Communication Other (comment)(DC needs adn use of AE)        Time: 1017-5102 OT Time Calculation (min): 55 min  Charges: OT General Charges $OT Visit: 1 Visit OT Treatments $Self Care/Home Management : 53-67 mins  [facility], OT/L  [phone] 08/30/2017   Tarini Carrier,HILLARY 08/30/2017, 4:55 PM

## 2017-08-31 DIAGNOSIS — I10 Essential (primary) hypertension: Secondary | ICD-10-CM

## 2017-08-31 LAB — CBC WITH DIFFERENTIAL/PLATELET
Basophils Absolute: 0 10*3/uL (ref 0.0–0.1)
Basophils Relative: 0 %
EOS ABS: 0.4 10*3/uL (ref 0.0–0.7)
EOS PCT: 3 %
HCT: 35.4 % — ABNORMAL LOW (ref 39.0–52.0)
Hemoglobin: 11.3 g/dL — ABNORMAL LOW (ref 13.0–17.0)
LYMPHS ABS: 2.6 10*3/uL (ref 0.7–4.0)
LYMPHS PCT: 22 %
MCH: 26.8 pg (ref 26.0–34.0)
MCHC: 31.9 g/dL (ref 30.0–36.0)
MCV: 84.1 fL (ref 78.0–100.0)
Monocytes Absolute: 0.6 10*3/uL (ref 0.1–1.0)
Monocytes Relative: 5 %
Neutro Abs: 8.4 10*3/uL — ABNORMAL HIGH (ref 1.7–7.7)
Neutrophils Relative %: 70 %
PLATELETS: 264 10*3/uL (ref 150–400)
RBC: 4.21 MIL/uL — AB (ref 4.22–5.81)
RDW: 15.7 % — AB (ref 11.5–15.5)
WBC: 12 10*3/uL — ABNORMAL HIGH (ref 4.0–10.5)

## 2017-08-31 LAB — GLUCOSE, CAPILLARY
GLUCOSE-CAPILLARY: 90 mg/dL (ref 65–99)
Glucose-Capillary: 90 mg/dL (ref 65–99)
Glucose-Capillary: 106 mg/dL — ABNORMAL HIGH (ref 65–99)
Glucose-Capillary: 101 mg/dL — ABNORMAL HIGH (ref 65–99)

## 2017-08-31 MED ORDER — WARFARIN VIDEO: Freq: Once | Status: DC

## 2017-08-31 MED ORDER — METOPROLOL SUCCINATE ER 100 MG PO TB24
100.0000 mg | ORAL_TABLET | Freq: Every day | ORAL | Status: DC
  Administered 2017-08-31 – 2017-09-01 (×2): 100 mg via ORAL
  Filled 2017-08-31 (×2): qty 1

## 2017-08-31 MED ORDER — COUMADIN BOOK
Freq: Once | Status: DC
  Filled 2017-08-31: qty 1

## 2017-08-31 MED ORDER — WARFARIN - PHYSICIAN DOSING INPATIENT: Freq: Every day | Status: DC

## 2017-08-31 MED ORDER — WARFARIN SODIUM 5 MG PO TABS
5.0000 mg | ORAL_TABLET | Freq: Every day | ORAL | Status: DC
  Administered 2017-08-31: 5 mg via ORAL
  Filled 2017-08-31: qty 1

## 2017-08-31 NOTE — Progress Notes (Signed)
Pt stated he can place self on home CPAP unit for the night when ready for bed. RT will continue to monitor as needed.

## 2017-08-31 NOTE — Progress Notes (Signed)
Occupational Therapy Treatment Patient Details Name: Reginald [name] MRN: [id number] DOB: [birth year] Today's Date: 08/31/2017    History of present illness Pt is a 47 y.o. male with PMH of HTN, HF, COPD, morbid obesity, pulmonary HTN, and aortic stenosis, admitted 08/23/17 now s/p aortic valve replacement and AAA repair; developed R bundle branch block postop. Pt also in a-fib with intermittent ventricular pacing from his temporary pacing wires. S/p pacemaker placement 4/9.    OT comments  Pt progressing towards OT goals this session. He was able to demonstrate abilities with all AE in room at this time with focus of education on accessing back and underarms maintaining precautions. Pt anxious about return home and OT encouraged him to use his [facility] services as a way to reinforce education, and problem solve in his own environment once he is home and sees what kind of questions he might have. Pt grateful at the end of the session. OT will continue to follow acutely prior to dc  Follow Up Recommendations  Home health OT;Supervision - Intermittent    Equipment Recommendations  Tub/shower seat;Other (comment)([employer], [employer])    Recommendations for Other Services      Precautions / Restrictions Precautions Precautions: Sternal;ICD/Pacemaker Precaution Comments: Verbally reviewed precautions Restrictions Weight Bearing Restrictions: Yes Other Position/Activity Restrictions: sternal/pacemaker precautions       Mobility Bed Mobility               General bed mobility comments: Pt OOB in recliner  Transfers Overall transfer level: Modified independent Equipment used: None;4-wheeled walker Transfers: Sit to/from Stand Sit to Stand: Modified independent (Device/Increase time)         General transfer comment: Good technique locking rollator brakes; no cues required    Balance Overall balance assessment: Mild deficits observed, not formally tested(no DME in room, Rollator in  hallway)                                         ADL either performed or assessed with clinical judgement   ADL Overall ADL's : Needs assistance/impaired                                       General ADL Comments: practiced washing under arms specifically and applying deoderant, able to demonstrate use of longhandle sponge to reach back for bathing, and stated that the clothing stick has helped him with socks in conjunction with the bariatric sock donner - Pt also shared that earlier today he was able to perform rear peri care using toilet aide     Vision       Perception     Praxis      Cognition Arousal/Alertness: Awake/alert Behavior During Therapy: Anxious Overall Cognitive Status: Within Functional Limits for tasks assessed                                          Exercises     Shoulder Instructions       General Comments Pt with ted hose on BLE    Pertinent Vitals/ Pain       Pain Assessment: Faces Faces Pain Scale: Hurts a little bit Pain Location: chest/middle back; incision Pain Descriptors / Indicators:  Sore Pain Intervention(s): Limited activity within patient's tolerance;Monitored during session;Repositioned  Home Living                                          Prior Functioning/Environment              Frequency  Min 3X/week        Progress Toward Goals  OT Goals(current goals can now be found in the care plan section)  Progress towards OT goals: Progressing toward goals  Acute Rehab OT Goals Patient Stated Goal: to be able to take care of myself OT Goal Formulation: With patient Time For Goal Achievement: 09/10/17 Potential to Achieve Goals: Good  Plan Discharge plan remains appropriate;Frequency needs to be updated    Co-evaluation                 AM-PAC PT "6 Clicks" Daily Activity     Outcome Measure   Help from another person eating meals?:  None Help from another person taking care of personal grooming?: A Little Help from another person toileting, which includes using toliet, bedpan, or urinal?: A Little Help from another person bathing (including washing, rinsing, drying)?: A Little Help from another person to put on and taking off regular upper body clothing?: A Little Help from another person to put on and taking off regular lower body clothing?: A Little 6 Click Score: 19    End of Session Equipment Utilized During Treatment: Other (comment)(Rollator)  OT Visit Diagnosis: Unsteadiness on feet (R26.81);Muscle weakness (generalized) (M62.81);Pain Pain - Right/Left: Right Pain - part of body: Shoulder(chest (incision))   Activity Tolerance Patient tolerated treatment well   Patient Left in chair;with call bell/phone within reach   Nurse Communication Mobility status        Time: 8341-9622 OT Time Calculation (min): 44 min  Charges: OT General Charges $OT Visit: 1 Visit OT Treatments $Self Care/Home Management : 38-52 mins Hulda Humphrey OTR/L [place] 08/31/2017, 5:57 PM

## 2017-08-31 NOTE — Discharge Instructions (Signed)
. Aortic Valve Replacement, Care After Refer to this sheet in the next few weeks. These instructions provide you with information about caring for yourself after your procedure. Your health care provider may also give you more specific instructions. Your treatment has been planned according to current medical practices, but problems sometimes occur. Call your health care provider if you have any problems or questions after your procedure. What can I expect after the procedure? After the procedure, it is common to have: Pain around your incision area. A small amount of blood or clear fluid coming from your incision.  Follow these instructions at home: Eating and drinking  Follow instructions from your health care provider about eating or drinking restrictions. Limit alcohol intake to no more than 1 drink per day for nonpregnant women and 2 drinks per day for men. One drink equals 12 oz of beer, 5 oz of wine, or 1 oz of hard liquor. Limit how much caffeine you drink. Caffeine can affect your heart's rate and rhythm. Drink enough fluid to keep your urine clear or pale yellow. Eat a heart-healthy diet. This should include plenty of fresh fruits and vegetables. If you eat meat, it should be lean cuts. Avoid foods that are: High in salt, saturated fat, or sugar. Canned or highly processed. [place] Pro. Activity Return to your normal activities as told by your health care provider. Ask your health care provider what activities are safe for you. Exercise regularly once you have recovered, as told by your health care provider. Avoid sitting for more than 2 hours at a time without moving. Get up and move around at least once every 1-2 hours. This helps to prevent blood clots in the legs. Do not lift anything that is heavier than 10 lb (4.5 kg) until your health care provider approves. Avoid pushing or pulling things with your arms until your health care provider approves. This includes pulling on handrails  to help you climb stairs. Incision care  Follow instructions from your health care provider about how to take care of your incision. Make sure you: Wash your hands with soap and water before you change your bandage (dressing). If soap and water are not available, use hand sanitizer. Change your dressing as told by your health care provider. Leave stitches (sutures), skin glue, or adhesive strips in place. These skin closures may need to stay in place for 2 weeks or longer. If adhesive strip edges start to loosen and curl up, you may trim the loose edges. Do not remove adhesive strips completely unless your health care provider tells you to do that. Check your incision area every day for signs of infection. Check for: More redness, swelling, or pain. More fluid or blood. Warmth. Pus or a bad smell. Medicines Take over-the-counter and prescription medicines only as told by your health care provider. If you were prescribed an antibiotic medicine, take it as told by your health care provider. Do not stop taking the antibiotic even if you start to feel better. Travel Avoid airplane travel for as long as told by your health care provider. When you travel, bring a list of your medicines and a record of your medical history with you. Carry your medicines with you. Driving Ask your health care provider when it is safe for you to drive. Do not drive until your health care provider approves. Do not drive or operate heavy machinery while taking prescription pain medicine. Lifestyle  Do not use any tobacco products, such as cigarettes, chewing tobacco,  or e-cigarettes. If you need help quitting, ask your health care provider. Resume sexual activity as told by your health care provider. Do not use medicines for erectile dysfunction unless your health care provider approves, if this applies. Work with your health care provider to keep your blood pressure and cholesterol under control, and to manage any  other heart conditions that you have. Maintain a healthy weight. General instructions Do not take baths, swim, or use a hot tub until your health care provider approves. Do not strain to have a bowel movement. Avoid crossing your legs while sitting down. Check your temperature every day for a fever. A fever may be a sign of infection. If you are a woman and you plan to become pregnant, talk with your health care provider before you become pregnant. Wear compression stockings if your health care provider instructs you to do this. These stockings help to prevent blood clots and reduce swelling in your legs. Tell all health care providers who care for you that you have an artificial (prosthetic) aortic valve. If you have or have had heart disease or endocarditis, tell all health care providers about these conditions as well. Keep all follow-up visits as told by your health care provider. This is important. Contact a health care provider if: You develop a skin rash. You experience sudden, unexplained changes in your weight. You have more redness, swelling, or pain around your incision. You have more fluid or blood coming from your incision. Your incision feels warm to the touch. You have pus or a bad smell coming from your incision. You have a fever. Get help right away if: You develop chest pain that is different from the pain coming from your incision. You develop shortness of breath or difficulty breathing. You start to feel light-headed. These symptoms may represent a serious problem that is an emergency. Do not wait to see if the symptoms will go away. Get medical help right away. Call your local emergency services (911 in the [place] Do not drive yourself to the hospital. This information is not intended to replace advice given to you by your health care provider. Make sure you discuss any questions you have with your health care provider. Document Released: 11/24/2004 Document Revised:  10/14/2015 Document Reviewed: 04/11/2015 Elsevier Interactive Patient Education  2017 [facility] Discharge Instructions for  Pacemaker Patients  Activity No heavy lifting or vigorous activity with your left/right arm for 6 to 8 weeks.  Do not raise your left/right arm above your head for one week.  Gradually raise your affected arm as drawn below.              09/01/17                     09/02/17                    09/03/17                  09/04/17 __  NO DRIVING for  1 week or until cleared by surgeon   .  WOUND CARE - Keep the wound area clean and dry.  Do not get this area wet, no showers until cleared to at your wound check visit. - The tape/steri-strips on your wound will fall off; do not pull them off.  No bandage is needed on the site.  DO  NOT apply any creams, oils, or ointments to the wound area. -  If you notice any drainage or discharge from the wound, any swelling or bruising at the site, or you develop a fever > 101? F after you are discharged home, call the office at once.  Special Instructions - You are still able to use cellular telephones; use the ear opposite the side where you have your pacemaker/defibrillator.  Avoid carrying your cellular phone near your device. - When traveling through airports, show security personnel your identification card to avoid being screened in the metal detectors.  Ask the security personnel to use the hand wand. - Avoid arc welding equipment, MRI testing (magnetic resonance imaging), TENS units (transcutaneous nerve stimulators).  Call the office for questions about other devices. - Avoid electrical appliances that are in poor condition or are not properly grounded. - Microwave ovens are safe to be near or to operate.  Information on my medicine - Coumadin   (Warfarin)  This medication education was reviewed with me or my healthcare representative as part of my discharge preparation.  Why was Coumadin prescribed for  you? Coumadin was prescribed for you because you have a blood clot or a medical condition that can cause an increased risk of forming blood clots. Blood clots can cause serious health problems by blocking the flow of blood to the heart, lung, or brain. Coumadin can prevent harmful blood clots from forming. As a reminder your indication for Coumadin is:   Stroke Prevention Because Of Atrial Fibrillation   What test will check on my response to Coumadin? While on Coumadin (warfarin) you will need to have an INR test regularly to ensure that your dose is keeping you in the desired range. The INR (international normalized ratio) number is calculated from the result of the laboratory test called prothrombin time (PT).  If an INR APPOINTMENT HAS NOT ALREADY BEEN MADE FOR YOU please schedule an appointment to have this lab work done by your health care provider within 7 days. Your INR goal is usually a number between:  2 to 3 or your provider may give you a more narrow range like 2-2.5.  Ask your health care provider during an office visit what your goal INR is.  What  do you need to  know  About  COUMADIN? Take Coumadin (warfarin) exactly as prescribed by your healthcare provider about the same time each day.  DO NOT stop taking without talking to the doctor who prescribed the medication.  Stopping without other blood clot prevention medication to take the place of Coumadin may increase your risk of developing a new clot or stroke.  Get refills before you run out.  What do you do if you miss a dose? If you miss a dose, take it as soon as you remember on the same day then continue your regularly scheduled regimen the next day.  Do not take two doses of Coumadin at the same time.  Important Safety Information A possible side effect of Coumadin (Warfarin) is an increased risk of bleeding. You should call your healthcare provider right away if you experience any of the following: ? Bleeding from an injury or  your nose that does not stop. ? Unusual colored urine (red or dark brown) or unusual colored stools (red or black). ? Unusual bruising for unknown reasons. ? A serious fall or if you hit your head (even if there is no bleeding).  Some foods or medicines interact with Coumadin (warfarin) and might alter your response to warfarin. To help avoid this: ? Eat a  balanced diet, maintaining a consistent amount of Vitamin K. ? Notify your provider about major diet changes you plan to make. ? Avoid alcohol or limit your intake to 1 drink for women and 2 drinks for men per day. (1 drink is 5 oz. wine, 12 oz. beer, or 1.5 oz. liquor.)  Make sure that ANY health care provider who prescribes medication for you knows that you are taking Coumadin (warfarin).  Also make sure the healthcare provider who is monitoring your Coumadin knows when you have started a new medication including herbals and non-prescription products.  Coumadin (Warfarin)  Major Drug Interactions  Increased Warfarin Effect Decreased Warfarin Effect  Alcohol (large quantities) Antibiotics (esp. Septra/Bactrim, Flagyl, Cipro) Amiodarone (Cordarone) Aspirin (ASA) Cimetidine (Tagamet) Megestrol (Megace) NSAIDs (ibuprofen, naproxen, etc.) Piroxicam (Feldene) Propafenone (Rythmol SR) Propranolol (Inderal) Isoniazid (INH) Posaconazole (Noxafil) Barbiturates (Phenobarbital) Carbamazepine (Tegretol) Chlordiazepoxide (Librium) Cholestyramine (Questran) Griseofulvin Oral Contraceptives Rifampin Sucralfate (Carafate) Vitamin K   Coumadin (Warfarin) Major Herbal Interactions  Increased Warfarin Effect Decreased Warfarin Effect  Garlic Ginseng Ginkgo biloba Coenzyme Q10 Green tea St. Johns wort    Coumadin (Warfarin) FOOD Interactions  Eat a consistent number of servings per week of foods HIGH in Vitamin K (1 serving =  cup)  Collards (cooked, or boiled & drained) Kale (cooked, or boiled & drained) Mustard greens (cooked,  or boiled & drained) Parsley *serving size only =  cup Spinach (cooked, or boiled & drained) Swiss chard (cooked, or boiled & drained) Turnip greens (cooked, or boiled & drained)  Eat a consistent number of servings per week of foods MEDIUM-HIGH in Vitamin K (1 serving = 1 cup)  Asparagus (cooked, or boiled & drained) Broccoli (cooked, boiled & drained, or raw & chopped) Brussel sprouts (cooked, or boiled & drained) *serving size only =  cup Lettuce, raw (green leaf, endive, romaine) Spinach, raw Turnip greens, raw & chopped   These websites have more information on Coumadin (warfarin):  FailFactory.se; VeganReport.com.au;

## 2017-08-31 NOTE — Progress Notes (Addendum)
      [address]       [place] [address]             [phone]      3 Days Post-Op Procedure(s) (LRB): PACEMAKER IMPLANT (N/A) Subjective: Feels ok, ambulating well afib into 140's at times  Objective: Vital signs in last 24 hours: Temp:  [97.9 F (36.6 C)-99.1 F (37.3 C)] 98.5 F (36.9 C) (04/12 0445) Pulse Rate:  [63-77] 77 (04/12 0445) Cardiac Rhythm: Atrial fibrillation;Ventricular paced (04/11 2000) Resp:  [19-32] 28 (04/12 0445) BP: (109-134)/(59-82) 109/80 (04/12 0445) SpO2:  [96 %-100 %] 96 % (04/12 0445)  Hemodynamic parameters for last 24 hours:    Intake/Output from previous day: 04/11 0701 - 04/12 0700 In: 840 [P.O.:840] Out: -  Intake/Output this shift: No intake/output data recorded.  General appearance: alert, cooperative and no distress Heart: irregularly irregular rhythm Lungs: dim in bases Abdomen: obese, nontender Extremities: difficult to assess with severe obesity Wound: incis healing well  Lab Results: Recent Labs    08/30/17 0455 08/31/17 0500  WBC 13.4* 12.0*  HGB 12.4* 11.3*  HCT 39.2 35.4*  PLT 304 264   BMET:  Recent Labs    08/29/17 0200 08/30/17 0455  NA 137 136  K 3.8 4.7  CL 98* 101  CO2 27 24  GLUCOSE 103* 107*  BUN 19 22*  CREATININE 0.89 1.06  CALCIUM 8.5* 8.8*    PT/INR: No results for input(s): LABPROT, INR in the last 72 hours. ABG    Component Value Date/Time   PHART 7.436 08/24/2017 0332   HCO3 27.0 08/24/2017 0332   TCO2 26 08/24/2017 1828   ACIDBASEDEF 2.0 08/23/2017 1858   O2SAT 93.0 08/24/2017 0332   CBG (last 3)  Recent Labs    08/30/17 1138 08/30/17 2154 08/31/17 0602  GLUCAP 119* 98 90    Meds Scheduled Meds: . allopurinol  300 mg Oral Daily  . amiodarone  400 mg Oral BID  . aspirin EC  325 mg Oral Daily  . bisacodyl  10 mg Oral Daily   Or  . bisacodyl  10 mg Rectal Daily  . Chlorhexidine Gluconate Cloth  6 each Topical Daily  . Chlorhexidine Gluconate  Cloth  6 each Topical Daily  . citalopram  20 mg Oral QHS  . docusate sodium  200 mg Oral Daily  . furosemide  40 mg Oral Daily  . insulin aspart  0-15 Units Subcutaneous TID WC  . lisinopril  10 mg Oral Daily  . mouth rinse  15 mL Mouth Rinse BID  . metoprolol succinate  50 mg Oral Daily  . pantoprazole  40 mg Oral Daily  . potassium chloride  40 mEq Oral BID  . sodium chloride flush  3 mL Intravenous Q12H  . spironolactone  25 mg Oral Daily   Continuous Infusions: . sodium chloride 250 mL (08/24/17 0550)  . sodium chloride    . potassium chloride     PRN Meds:.sodium chloride, acetaminophen, clonazePAM, HYDROcodone-acetaminophen, magnesium hydroxide, metoprolol tartrate, morphine injection, ondansetron (ZOFRAN) IV, oxyCODONE, potassium chloride, sodium chloride flush, sodium chloride flush, traMADol  Xrays No results found.  Assessment/Plan: S/P Procedure(s) (LRB): PACEMAKER IMPLANT (N/A)   1 overall conts to do well, will increase beta blocker dose, will discuss coumadin with Dr Roxy Manns 2 labs ok 3 cont to push rehab  LOS: 8 days   Addendum- spoke with Dr Roxy Manns who wants to start coumadin  Reginald [name] 08/31/2017

## 2017-08-31 NOTE — Progress Notes (Signed)
CARDIAC REHAB PHASE I   PRE:  Rate/Rhythm: v paced SR 78  BP:  Supine:   Sitting: 143/72  Standing:    SaO2: 97%RA  MODE:  Ambulation: 800 ft   POST:  Rate/Rhythm: v paced 105 ST  BP:  Supine:   Sitting: 117/87  Standing:    SaO2: 96%RA 0830-0915 Pt walked 800 ft on RA with rollator independently. Gait steady and did not need to sit to rest. Pt with some DOE. Stopped several times to rest. Has some difficulty with pursed lip breathing. Sats good on return to room. To recliner. Pt adhering to sternal precautions. He demonstrated IS and he can get to 2000 ml correctly.   Graylon Good, RN BSN  08/31/2017 9:11 AM

## 2017-08-31 NOTE — Care Management Note (Signed)
Case Management Note  Patient Details  Name: Reginald [name] MRN: [id number] Date of Birth: 19-Mar-1971  Subjective/Objective:             Spoke to patient at the bedside. He declines HH. He requested bariatric rollator and shower seat as rec by PT. Orders placed. Per Brenton Grills w [facility] patient will have to go by [facility] store to get these, they are not in stock here to be delivered to room prior to DC. Patient updated, provided with copy of order.        Action/Plan:   Expected Discharge Date:  08/28/17               Expected Discharge Plan:  [place]  In-House Referral:  NA  Discharge planning Services  CM Consult  Post Acute Care Choice:  Durable Medical Equipment Choice offered to:  Patient  DME Arranged:  Walker rolling with seat, Shower stool DME Agency:  [place]:  Patient Refused [place] Agency:     Status of Service:  Completed, signed off  If discussed at [place] of Stay Meetings, dates discussed:    Additional Comments:  Carles Collet, RN 08/31/2017, 10:32 AM

## 2017-08-31 NOTE — Progress Notes (Addendum)
Progress Note  Patient Name: Reginald [name] Date of Encounter: 08/31/2017  Primary Cardiologist: Kate Sable, MD   Subjective   Feels well, hopes to go home tomorrow, no CP, palpitations or SOB, he denies any post-op pain  Inpatient Medications    Scheduled Meds: . allopurinol  300 mg Oral Daily  . amiodarone  400 mg Oral BID  . aspirin EC  325 mg Oral Daily  . bisacodyl  10 mg Oral Daily   Or  . bisacodyl  10 mg Rectal Daily  . Chlorhexidine Gluconate Cloth  6 each Topical Daily  . Chlorhexidine Gluconate Cloth  6 each Topical Daily  . citalopram  20 mg Oral QHS  . docusate sodium  200 mg Oral Daily  . furosemide  40 mg Oral Daily  . insulin aspart  0-15 Units Subcutaneous TID WC  . lisinopril  10 mg Oral Daily  . mouth rinse  15 mL Mouth Rinse BID  . metoprolol succinate  100 mg Oral Daily  . pantoprazole  40 mg Oral Daily  . potassium chloride  40 mEq Oral BID  . sodium chloride flush  3 mL Intravenous Q12H  . spironolactone  25 mg Oral Daily   Continuous Infusions: . sodium chloride 250 mL (08/24/17 0550)  . sodium chloride    . potassium chloride     PRN Meds: sodium chloride, acetaminophen, clonazePAM, HYDROcodone-acetaminophen, magnesium hydroxide, metoprolol tartrate, morphine injection, ondansetron (ZOFRAN) IV, oxyCODONE, potassium chloride, sodium chloride flush, sodium chloride flush, traMADol   Vital Signs    Vitals:   08/30/17 1937 08/30/17 2359 08/31/17 0445 08/31/17 0837  BP: (!) 125/59 124/82 109/80 (!) 143/72  Pulse: 72 70 77 77  Resp: (!) 32 (!) 21 (!) 28   Temp: 97.9 F (36.6 C) 98.2 F (36.8 C) 98.5 F (36.9 C) 98.4 F (36.9 C)  TempSrc: Oral Oral Oral Oral  SpO2: 100% 97% 96% 97%  Weight:      Height:        Intake/Output Summary (Last 24 hours) at 08/31/2017 0932 Last data filed at 08/31/2017 0850 Gross per 24 hour  Intake 716 ml  Output -  Net 716 ml   Filed Weights   08/28/17 0500 08/29/17 0500 08/30/17 0500  Weight:  (!) 426 lb 13 oz (193.6 kg) (!) 422 lb 6.4 oz (191.6 kg) (!) 426 lb 9.6 oz (193.5 kg)    Telemetry    AFib mostly paced with some rapid AFib episodes >>> SR this AM w/Vpaced rhythm - Personally Reviewed  ECG    V paced - Personally Reviewed  Physical Exam   GEN: No acute distress, he was observed to ambulate in the hallway with walker,  independeently and without difficulty Neck: No JVD Cardiac: RRR, no murmurs, rubs, or gallops.  Respiratory: dimished at the bases. GI: Soft, nontender, obese  MS: No appreciable pitting edema; obese, support stockings, No deformity. Neuro:  Nonfocal  Psych: Normal affect   Labs    Chemistry Recent Labs  Lab 08/28/17 0409 08/28/17 1817 08/29/17 0200 08/30/17 0455  NA 137  --  137 136  K 3.4* 3.9 3.8 4.7  CL 100*  --  98* 101  CO2 28  --  27 24  GLUCOSE 103*  --  103* 107*  BUN 17  --  19 22*  CREATININE 0.80  --  0.89 1.06  CALCIUM 8.4*  --  8.5* 8.8*  PROT  --   --   --  6.8  ALBUMIN  --   --   --  2.9*  AST  --   --   --  27  ALT  --   --   --  26  ALKPHOS  --   --   --  85  BILITOT  --   --   --  0.7  GFRNONAA >60  --  >60 >60  GFRAA >60  --  >60 >60  ANIONGAP 9  --  12 11     Hematology Recent Labs  Lab 08/28/17 0409 08/30/17 0455 08/31/17 0500  WBC 11.8* 13.4* 12.0*  RBC 4.18* 4.66 4.21*  HGB 11.3* 12.4* 11.3*  HCT 34.8* 39.2 35.4*  MCV 83.3 84.1 84.1  MCH 27.0 26.6 26.8  MCHC 32.5 31.6 31.9  RDW 15.5 15.8* 15.7*  PLT 210 304 264    Cardiac EnzymesNo results for input(s): TROPONINI in the last 168 hours. No results for input(s): TROPIPOC in the last 168 hours.   BNPNo results for input(s): BNP, PROBNP in the last 168 hours.   DDimer No results for input(s): DDIMER in the last 168 hours.   Radiology    Dg Chest 2 View Result Date: 08/29/2017 CLINICAL DATA:  Status post pacemaker placement EXAM: CHEST - 2 VIEW COMPARISON:  08/27/2017 FINDINGS: Cardiac shadow is enlarged but stable. Pacing device is noted  new from the prior exam. No pneumothorax is seen. Postsurgical changes are noted. Right-sided PICC line is stable. Mild right pleural effusion is seen. IMPRESSION: No pneumothorax following pacemaker placement. Stable right pleural effusion. Electronically Signed   By: Inez Catalina M.D.   On: 08/29/2017 08:10     Cardiac Studies   07/16/17: R/LHC 1. No angiographic evidence of CAD 2. Severe aortic stenosis by TEE/TTE. Due to turbulent flow in the ascending aorta and movement of the catheters, I could not cross the valve. The patient had a TEE this am that demonstrated severe AS. I did not feel that further attempts at crossing the valve would provide any clinical data that would change his treatment plan, though more attempts increased the risk of procedure complication. 3. Normal right heart pressures Recommendations: Will continue planning for AVR vs TAVR. Pt to f/u with Dr. Roxy Manns after CT scans.   07/16/17: TEE Study Conclusions - Left ventricle: Systolic function was normal. The estimated ejection fraction was in the range of 55% to 60%. Wall motion was normal; there were no regional wall motion abnormalities. - Aortic valve: Bicuspid; mildly thickened, mildly calcified leaflets. There was severe stenosis. - Mitral valve: No evidence of vegetation. - Right atrium: No evidence of thrombus in the atrial cavity or appendage. - Atrial septum: No defect or patent foramen ovale was identified. - Tricuspid valve: No evidence of vegetation. - Pulmonic valve: No evidence of vegetation. Impressions: - Normal LV systolic function; bicuspid aortic valve with severe AS (mean gradient 46 mmHg); no AI; mildly dilated aortic root that appears to be increasing in size in the ascending aorta but difficult to fully assess; suggest CTA to further evaluate.   Patient Profile     47 y.o. male with a hx of HD w/bicuspid AV/severe AS now s/p AVR and ascending aneurysm repair, obseity, HTN,  smoker/COPD, gout with persistent post-op CHB now s/p PPM  Assessment & Plan    1. VHD, severe AS 2/2 Bicuspic valve      now s/p Bioprosthetic AVR 08/23/17, Dr. Roxy Manns, POD #8  2. Post op AFib     CHA2DS2Vasc is  one, ?2 with some degree of glucose intolerance post operatively.     A/c timing is deferred to surgical service a/c      He has converted to SR This AM     On amiodarone 400mg  BID and Toprol XL 100mg   3. 08/28/17 on POD # 5 pt was noted with persistent CHB >> PPM 08/28/17     New RBBB post op     S/p PPM 08/28/17 with Dr. Rayann Heman, POD # 3  Site remains stable, dry, no hematoma Wound care was re-discussed with the patient LUE activity restrictions were re-discussed with the patient PPM check POD #1 with intact function, no R waves at 30bpm CXR POD #1 without ptx EP/routine PPM implant follow up is in place  Please remove picc line ASAP  4. Fluid OL     Diuresing, cumulatively is negative 5330ml    For questions or updates, please contact [place] Please consult [email or web address] for contact info under Cardiology/STEMI.      Signed, Baldwin [place], PA-C  08/31/2017, 9:32 AM     I have seen, examined the patient, and reviewed the above assessment and plan.  Changes to above are made where necessary.  On exam, ambulatory, no concerns.  Will need follow-up in AF clinic next week Also enroll in coumadin clinic in [place] with INRs 5 days after initiation.  Electrophysiology team to see as needed while here. Please call with questions.   Co Sign: Thompson Grayer, MD 08/31/2017 1:31 PM

## 2017-09-01 LAB — PROTIME-INR
INR: 1.12
PROTHROMBIN TIME: 14.3 s (ref 11.4–15.2)

## 2017-09-01 LAB — GLUCOSE, CAPILLARY
GLUCOSE-CAPILLARY: 100 mg/dL — AB (ref 65–99)
Glucose-Capillary: 92 mg/dL (ref 65–99)

## 2017-09-01 MED ORDER — AMIODARONE HCL 200 MG PO TABS: 200.0000 mg | ORAL_TABLET | Freq: Two times a day (BID) | ORAL | 1 refills | Status: DC

## 2017-09-01 MED ORDER — WARFARIN SODIUM 5 MG PO TABS: 5.0000 mg | ORAL_TABLET | Freq: Every day | ORAL | 1 refills | Status: DC

## 2017-09-01 MED ORDER — ASPIRIN 325 MG PO TBEC: 325.0000 mg | DELAYED_RELEASE_TABLET | Freq: Every day | ORAL | Status: DC

## 2017-09-01 MED ORDER — METOPROLOL SUCCINATE ER 100 MG PO TB24: 100.0000 mg | ORAL_TABLET | Freq: Every day | ORAL | 1 refills | Status: DC

## 2017-09-01 MED ORDER — OXYCODONE HCL 5 MG PO TABS: 5.0000 mg | ORAL_TABLET | Freq: Four times a day (QID) | ORAL | 0 refills | Status: AC | PRN

## 2017-09-01 NOTE — Progress Notes (Signed)
Patient is ready for discharge. Patient has had all of his questions regarding his discharge answered. Patient has all of his belongings with him. He has had his PICC line removed by the IV team. He has been [place] frrom telemetry. Patient will leave unit in a wheelchair to meet his transportation home. He will be transported home by his brother, Reginald [name] who is here with him now.

## 2017-09-01 NOTE — Progress Notes (Signed)
CARDIAC REHAB PHASE I   PRE:  Rate/Rhythm: 65 VP  BP:  Sitting: 123/83      SaO2: 99 RA  MODE:  Ambulation: 470 ft   POST:  Rate/Rhythm: 89 VP  BP:  Sitting: 150/78     SaO2: 97 RA  [id number] Patient ambulated in hallway with bariatric rollator independently. Steady gait noted. 2 sitting rest breaks taken for dyspnea. Post ambulation patient back to chair in room with call bell and phone in reach. Patient extremely concerned that he will not have insurance after FMLA runs out next month. After talking with social worker, it was determined that patient has been given resources for insurance options including Medicaid once private insurance runs out. Patient admits to being leery about accepting Medicaid as he feels he will have to give up his home and any savings he has accumulated. Discharge education completed including smoking cessation resources, activity progression, diet, and cardiac surgery booklet. Patient verbalizes understanding. Patient has also agreed to [facility] and to phase 2 cardiac rehab at [facility] if he is able to keep or purchase additional insurance. Order placed to [facility] and letter sent.  Nafeesa Dils English PayneRN, BSN 09/01/2017 9:47 AM

## 2017-09-01 NOTE — Progress Notes (Signed)
Progress Note  Patient Name: Reginald [name] Date of Encounter: 09/01/2017  Primary Cardiologist: Kate Sable, MD   Subjective   Feels well. Anticipating DC today. He was worried that his heart rate was in the 60s, since he was told his device was programmed at 92.  Inpatient Medications    Scheduled Meds: . allopurinol  300 mg Oral Daily  . amiodarone  400 mg Oral BID  . aspirin EC  325 mg Oral Daily  . bisacodyl  10 mg Oral Daily   Or  . bisacodyl  10 mg Rectal Daily  . Chlorhexidine Gluconate Cloth  6 each Topical Daily  . Chlorhexidine Gluconate Cloth  6 each Topical Daily  . citalopram  20 mg Oral QHS  . coumadin book   Does not apply Once  . docusate sodium  200 mg Oral Daily  . furosemide  40 mg Oral Daily  . insulin aspart  0-15 Units Subcutaneous TID WC  . lisinopril  10 mg Oral Daily  . mouth rinse  15 mL Mouth Rinse BID  . metoprolol succinate  100 mg Oral Daily  . pantoprazole  40 mg Oral Daily  . potassium chloride  40 mEq Oral BID  . sodium chloride flush  3 mL Intravenous Q12H  . spironolactone  25 mg Oral Daily  . warfarin  5 mg Oral q1800  . warfarin   Does not apply Once  . Warfarin - Physician Dosing Inpatient   Does not apply q1800   Continuous Infusions: . sodium chloride 250 mL (08/24/17 0550)  . sodium chloride    . potassium chloride     PRN Meds: sodium chloride, acetaminophen, clonazePAM, HYDROcodone-acetaminophen, magnesium hydroxide, metoprolol tartrate, morphine injection, ondansetron (ZOFRAN) IV, oxyCODONE, potassium chloride, sodium chloride flush, sodium chloride flush, traMADol   Vital Signs    Vitals:   08/31/17 1124 08/31/17 2020 08/31/17 2350 09/01/17 0515  BP:  104/86 111/67 136/85  Pulse: 75     Resp:  20 20 (!) 23  Temp:  98.7 F (37.1 C) (!) 96.9 F (36.1 C) 98.3 F (36.8 C)  TempSrc:  Oral Axillary Oral  SpO2: 96% 96% 96% 97%  Weight:    (!) 428 lb 12.7 oz (194.5 kg)  Height:        Intake/Output Summary  (Last 24 hours) at 09/01/2017 0811 Last data filed at 08/31/2017 0850 Gross per 24 hour  Intake 236 ml  Output -  Net 236 ml   Filed Weights   08/29/17 0500 08/30/17 0500 09/01/17 0515  Weight: (!) 422 lb 6.4 oz (191.6 kg) (!) 426 lb 9.6 oz (193.5 kg) (!) 428 lb 12.7 oz (194.5 kg)    Telemetry    A-sensed (Sinus), V-paced - Personally Reviewed  ECG    No new tracing - Personally Reviewed  Physical Exam  Super obese GEN: No acute distress.   Neck: No JVD Cardiac: RRR, no murmurs, rubs, or gallops. Healthy PM site and other surgical sites Respiratory: Clear to auscultation bilaterally. GI: Soft, nontender, non-distended  MS: No edema; No deformity. Neuro:  Nonfocal  Psych: Normal affect   Labs    Chemistry Recent Labs  Lab 08/28/17 0409 08/28/17 1817 08/29/17 0200 08/30/17 0455  NA 137  --  137 136  K 3.4* 3.9 3.8 4.7  CL 100*  --  98* 101  CO2 28  --  27 24  GLUCOSE 103*  --  103* 107*  BUN 17  --  19 22*  CREATININE 0.80  --  0.89 1.06  CALCIUM 8.4*  --  8.5* 8.8*  PROT  --   --   --  6.8  ALBUMIN  --   --   --  2.9*  AST  --   --   --  27  ALT  --   --   --  26  ALKPHOS  --   --   --  85  BILITOT  --   --   --  0.7  GFRNONAA >60  --  >60 >60  GFRAA >60  --  >60 >60  ANIONGAP 9  --  12 11     Hematology Recent Labs  Lab 08/28/17 0409 08/30/17 0455 08/31/17 0500  WBC 11.8* 13.4* 12.0*  RBC 4.18* 4.66 4.21*  HGB 11.3* 12.4* 11.3*  HCT 34.8* 39.2 35.4*  MCV 83.3 84.1 84.1  MCH 27.0 26.6 26.8  MCHC 32.5 31.6 31.9  RDW 15.5 15.8* 15.7*  PLT 210 304 264    Cardiac EnzymesNo results for input(s): TROPONINI in the last 168 hours. No results for input(s): TROPIPOC in the last 168 hours.   BNPNo results for input(s): BNP, PROBNP in the last 168 hours.   DDimer No results for input(s): DDIMER in the last 168 hours.   Radiology    No results found.  Cardiac Studies   07/16/17: R/LHC 1. No angiographic evidence of CAD 2. Severe aortic stenosis  by TEE/TTE. Due to turbulent flow in the ascending aorta and movement of the catheters, I could not cross the valve. The patient had a TEE this am that demonstrated severe AS. I did not feel that further attempts at crossing the valve would provide any clinical data that would change his treatment plan, though more attempts increased the risk of procedure complication. 3. Normal right heart pressures Recommendations: Will continue planning for AVR vs TAVR. Pt to f/u with Dr. Roxy Manns after CT scans.  07/16/17: TEE Study Conclusions - Left ventricle: Systolic function was normal. The estimated ejection fraction was in the range of 55% to 60%. Wall motion was normal; there were no regional wall motion abnormalities. - Aortic valve: Bicuspid; mildly thickened, mildly calcified leaflets. There was severe stenosis. - Mitral valve: No evidence of vegetation. - Right atrium: No evidence of thrombus in the atrial cavity or appendage. - Atrial septum: No defect or patent foramen ovale was identified. - Tricuspid valve: No evidence of vegetation. - Pulmonic valve: No evidence of vegetation. Impressions: - Normal LV systolic function; bicuspid aortic valve with severe AS (mean gradient 46 mmHg); no AI; mildly dilated aortic root that appears to be increasing in size in the ascending aorta but difficult to fully assess; suggest CTA to further evaluate.    Patient Profile     47 y.o.super-obese male with CHB and postop atrial fibrillation,  following bioprosthetic AVR and ascending aneurysm repair for bicuspid AV with severe AS, now s/p PPM ([employer])  Assessment & Plan    1. CHB s/p PPM:  Normal device function. Discussed with Dr. Rayann Heman - planned to have LRL 60 bpm. Have arranged anticoagulation clinic and pacemaker follow up with Dr. Rayann Heman in [facility]. Clinical f/u with Dr. Bronson Ing. 2. Postop AF:  Should be on anticoagulation for at least 3 months for AVR and then  reassess AF burden. 3. S/P AVR: anticipate DC today.  For questions or updates, please contact [place] Please consult [email or web address] for contact info under Cardiology/STEMI.  Signed, Sanda Klein, MD  09/01/2017, 8:11 AM

## 2017-09-01 NOTE — Progress Notes (Addendum)
[address]Suite [address]       [name] [address]             [phone]      4 Days Post-Op Procedure(s) (LRB): PACEMAKER IMPLANT (N/A) Subjective: Looks and feels well Vpacing in the 60's  Objective: Vital signs in last 24 hours: Temp:  [96.9 F (36.1 C)-98.7 F (37.1 C)] 98.3 F (36.8 C) (04/13 0515) Pulse Rate:  [75-77] 75 (04/12 1124) Cardiac Rhythm: Ventricular paced (04/12 2350) Resp:  [20-23] 23 (04/13 0515) BP: (104-143)/(67-86) 136/85 (04/13 0515) SpO2:  [96 %-97 %] 97 % (04/13 0515) Weight:  [428 lb 12.7 oz (194.5 kg)] 428 lb 12.7 oz (194.5 kg) (04/13 0515)  Hemodynamic parameters for last 24 hours:    Intake/Output from previous day: 04/12 0701 - 04/13 0700 In: 236 [P.O.:236] Out: -  Intake/Output this shift: No intake/output data recorded.  General appearance: alert, cooperative and no distress Heart: regular rate and rhythm Lungs: mildly dim in bases Abdomen: obese, benign Extremities: no change Wound: incis healing well, no sternal instability or drainage  Lab Results: Recent Labs    08/30/17 0455 08/31/17 0500  WBC 13.4* 12.0*  HGB 12.4* 11.3*  HCT 39.2 35.4*  PLT 304 264   BMET:  Recent Labs    08/30/17 0455  NA 136  K 4.7  CL 101  CO2 24  GLUCOSE 107*  BUN 22*  CREATININE 1.06  CALCIUM 8.8*    PT/INR:  Recent Labs    09/01/17 0425  LABPROT 14.3  INR 1.12   ABG    Component Value Date/Time   PHART 7.436 08/24/2017 0332   HCO3 27.0 08/24/2017 0332   TCO2 26 08/24/2017 1828   ACIDBASEDEF 2.0 08/23/2017 1858   O2SAT 93.0 08/24/2017 0332   CBG (last 3)  Recent Labs    08/31/17 1646 08/31/17 2038 09/01/17 0627  GLUCAP 106* 101* 92    Meds Scheduled Meds: . allopurinol  300 mg Oral Daily  . amiodarone  400 mg Oral BID  . aspirin EC  325 mg Oral Daily  . bisacodyl  10 mg Oral Daily   Or  . bisacodyl  10 mg Rectal Daily  . Chlorhexidine Gluconate Cloth  6 each Topical Daily  . Chlorhexidine  Gluconate Cloth  6 each Topical Daily  . citalopram  20 mg Oral QHS  . coumadin book   Does not apply Once  . docusate sodium  200 mg Oral Daily  . furosemide  40 mg Oral Daily  . insulin aspart  0-15 Units Subcutaneous TID WC  . lisinopril  10 mg Oral Daily  . mouth rinse  15 mL Mouth Rinse BID  . metoprolol succinate  100 mg Oral Daily  . pantoprazole  40 mg Oral Daily  . potassium chloride  40 mEq Oral BID  . sodium chloride flush  3 mL Intravenous Q12H  . spironolactone  25 mg Oral Daily  . warfarin  5 mg Oral q1800  . warfarin   Does not apply Once  . Warfarin - Physician Dosing Inpatient   Does not apply q1800   Continuous Infusions: . sodium chloride 250 mL (08/24/17 0550)  . sodium chloride    . potassium chloride     PRN Meds:.sodium chloride, acetaminophen, clonazePAM, HYDROcodone-acetaminophen, magnesium hydroxide, metoprolol tartrate, morphine injection, ondansetron (ZOFRAN) IV, oxyCODONE, potassium chloride, sodium chloride flush, sodium chloride flush, traMADol  Xrays No results found.  Assessment/Plan: S/P Procedure(s) (LRB): PACEMAKER IMPLANT (  N/A)  1 doing well 2 has converted from afib, vpacing. Now on coumadin . Discussed follow up with Dr Rayann Heman and Dr Orene Desanctis and they are arranging appt, also afib clinic. INR 1.12- will cont with 5 mg for now 3 BS well controlled 4 no other new labs 5 stable for d/c unless cardiology feel pacer needs adjustment      LOS: 9 days    Reginald [name] 09/01/2017   Chart reviewed, patient examined, agree with above. He is doing well and can go home today.

## 2017-09-01 NOTE — Plan of Care (Signed)
  Problem: Safety: Goal: Ability to remain free from injury will improve Outcome: Progressing   Problem: Education: Goal: Knowledge of cardiac device and self-care will improve Outcome: Progressing Goal: Ability to safely manage health related needs after discharge will improve Outcome: Progressing

## 2017-09-01 NOTE — Care Management Note (Signed)
Case Management Note  Patient Details  Name: Reginald [name] MRN: [id number] Date of Birth: December 07, 1970  Subjective/Objective:                Spoke w patient this AM, agreeable to [facility] RN PT. Referral accepted by [facility]. No other CM needs    Action/Plan:   Expected Discharge Date:  09/01/17               Expected Discharge Plan:  [place]  In-House Referral:  NA  Discharge planning Services  CM Consult  Post Acute Care Choice:  Durable Medical Equipment Choice offered to:  Patient  DME Arranged:  Walker rolling with seat, Shower stool DME Agency:  [place]:  RN, PT [facility] Agency:  [place]  Status of Service:  Completed, signed off  If discussed at [place] of Stay Meetings, dates discussed:    Additional Comments:  Carles Collet, RN 09/01/2017, 9:19 AM

## 2017-09-04 ENCOUNTER — Ambulatory Visit (INDEPENDENT_AMBULATORY_CARE_PROVIDER_SITE_OTHER): Payer: BLUE CROSS/BLUE SHIELD | Admitting: *Deleted

## 2017-09-04 DIAGNOSIS — Z953 Presence of xenogenic heart valve: Secondary | ICD-10-CM

## 2017-09-04 DIAGNOSIS — Z5181 Encounter for therapeutic drug level monitoring: Secondary | ICD-10-CM | POA: Diagnosis not present

## 2017-09-04 LAB — POCT INR: INR: 1.3

## 2017-09-04 NOTE — Patient Instructions (Signed)
Pt has been on coumadin 5mg  daily since d/c on 4/13.  D/C INR was 1.12 On Amiodarone 200mg  bid Continue coumadin 5mg  daily.  Recheck INR 4/23

## 2017-09-06 ENCOUNTER — Encounter (HOSPITAL_COMMUNITY): Payer: Self-pay | Admitting: Nurse Practitioner

## 2017-09-06 ENCOUNTER — Ambulatory Visit (HOSPITAL_COMMUNITY)
Admit: 2017-09-06 | Discharge: 2017-09-06 | Disposition: A | Discharge status: Home / Self Care | Payer: BLUE CROSS/BLUE SHIELD | Attending: Nurse Practitioner | Admitting: Nurse Practitioner

## 2017-09-06 VITALS — BP 118/68 | HR 72 | Ht 67.0 in | Wt >= 6400 oz

## 2017-09-06 DIAGNOSIS — I5032 Chronic diastolic (congestive) heart failure: Secondary | ICD-10-CM | POA: Insufficient documentation

## 2017-09-06 DIAGNOSIS — G4733 Obstructive sleep apnea (adult) (pediatric): Secondary | ICD-10-CM | POA: Diagnosis not present

## 2017-09-06 DIAGNOSIS — F419 Anxiety disorder, unspecified: Secondary | ICD-10-CM | POA: Insufficient documentation

## 2017-09-06 DIAGNOSIS — Z7982 Long term (current) use of aspirin: Secondary | ICD-10-CM | POA: Diagnosis not present

## 2017-09-06 DIAGNOSIS — M109 Gout, unspecified: Secondary | ICD-10-CM | POA: Diagnosis not present

## 2017-09-06 DIAGNOSIS — Z7901 Long term (current) use of anticoagulants: Secondary | ICD-10-CM | POA: Diagnosis not present

## 2017-09-06 DIAGNOSIS — Z953 Presence of xenogenic heart valve: Secondary | ICD-10-CM | POA: Insufficient documentation

## 2017-09-06 DIAGNOSIS — F17210 Nicotine dependence, cigarettes, uncomplicated: Secondary | ICD-10-CM | POA: Insufficient documentation

## 2017-09-06 DIAGNOSIS — Z79899 Other long term (current) drug therapy: Secondary | ICD-10-CM | POA: Diagnosis not present

## 2017-09-06 DIAGNOSIS — I110 Hypertensive heart disease with heart failure: Secondary | ICD-10-CM | POA: Insufficient documentation

## 2017-09-06 DIAGNOSIS — I872 Venous insufficiency (chronic) (peripheral): Secondary | ICD-10-CM | POA: Diagnosis not present

## 2017-09-06 DIAGNOSIS — J449 Chronic obstructive pulmonary disease, unspecified: Secondary | ICD-10-CM | POA: Insufficient documentation

## 2017-09-06 DIAGNOSIS — I272 Pulmonary hypertension, unspecified: Secondary | ICD-10-CM | POA: Diagnosis not present

## 2017-09-06 DIAGNOSIS — R011 Cardiac murmur, unspecified: Secondary | ICD-10-CM | POA: Diagnosis not present

## 2017-09-06 DIAGNOSIS — I350 Nonrheumatic aortic (valve) stenosis: Secondary | ICD-10-CM | POA: Insufficient documentation

## 2017-09-06 DIAGNOSIS — I480 Paroxysmal atrial fibrillation: Secondary | ICD-10-CM

## 2017-09-06 NOTE — Progress Notes (Signed)
Primary Care Physician: Monico Blitz, MD Referring Physician: Dr. Griffin Basil is a 47 y.o. male with a h/o  long-standing heart murmur, chronic diastolic congestive heart failure, hypertension, chronic venous insufficiency, obstructive sleep apnea on CPAP, and long-standing tobacco abuse with COPD who was referred to Dr. Roxy Manns for surgical opinion regarding treatment options for the management of suspected bicuspid aortic valve with severe aortic stenosis. He is morbidly obese but describes a long history of symptoms of exertional shortness of breath which has been significantly progressive over the past 6 months.  A TEE done by Dr. Jacinta Shoe in December 2018 showed the presence of severe aortic stenosis with normal left ventricular systolic function.  Due to these findings he was referred to Dr. Roxy Manns for surgical opinion.  He was originally seen in consultation on June 25, 2017.  Since then he underwent a TEE and diagnostic cardiac catheterization on July 16, 2017.  TEE confirmed the presence of bicuspid aortic valve with severe aortic stenosis.  A mean transvalvular gradient across the aortic valve was estimated at 46 mmHg.  Left ventricular systolic function appeared normal with ejection fraction estimated at 55-60%.  The ascending aorta appeared dilated but it was difficult to image.  No other significant abnormalities were noted.  Catheterization was notable for the absence of significant coronary artery disease.  The aortic valve could not be crossed.  There were normal right sided pressures.  A subsequent CT angiogram and pulmonary function tests were done.  There was notable enlargement of the ascending aorta.  After full review of the patient history and physical as well as the studies Dr. Roxy Manns recommended surgical intervention and he was admitted 4/4 through 4/13 for the procedure.   He did develop postoperative complete heart block and EP consultation was obtained.  He was  monitored closely but ultimately it was determined that he would best be served with proceeding with placement of permanent pacemaker which was done on 08/28/2017 by Dr. Rayann Heman.  He tolerated it well and was placed back into the SICU. He was d/c in godd condition.  He is in the afib clinic, 4/18 and appears stable. His weight is done 3 lbs from last recorded. His LLE is improved but still present, chronic. Continues on lasix..Compression socks are on. He feels tired. He is in a paced rhythm. Has had INR checked since hospitalization in the [place] area. Pacer site with steri strips in place. Surgical incisions healing nicely.  Today, he denies symptoms of palpitations, chest pain, shortness of breath, orthopnea, PND, lower extremity edema, dizziness, presyncope, syncope, or neurologic sequela. The patient is tolerating medications without difficulties and is otherwise without complaint today.   Past Medical History:  Diagnosis Date  . Anxiety   . Aortic stenosis   . Aortic stenosis   . Asthma   . Back pain   . Cellulitis of skin with lymphangitis   . CHF (congestive heart failure) ([place])   . Chronic diastolic congestive heart failure ([place])   . Chronic venous insufficiency   . Constipation   . COPD (chronic obstructive pulmonary disease) ([place])   . Dyspnea   . Essential hypertension   . Gout   . Hypertension   . Morbid obesity ([place])   . Obesity   . Pneumonia    walking pneumonia  . Prostatitis   . Pulmonary hypertension ([place])   . S/P aortic valve replacement with bioprosthetic valve 08/23/2017   25 mm Edwards Inspiris Resilia stented bovine  pericardial tissue valve  . 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 Sleep apnea    cpap   . Thoracic ascending aortic aneurysm ([place])   . Thoracic ascending aortic aneurysm ([place])   . Tobacco abuse    Past Surgical History:  Procedure Laterality Date  . AORTIC VALVE REPLACEMENT N/A 08/23/2017    Procedure: AORTIC VALVE REPLACEMENT (AVR) USING INSPIRIS RESILIA AORTIC VALVE SIZE 25 MM;  Surgeon: Rexene Alberts, MD;  Location: [facility];  Service: Open Heart Surgery;  Laterality: N/A;  . MULTIPLE EXTRACTIONS WITH ALVEOLOPLASTY N/A 07/23/2017   Procedure: Extraction of tooth #10 with alveoloplasty and gross debridement of remaining teeth;  Surgeon: Lenn Cal, DDS;  Location: [facility];  Service: Oral Surgery;  Laterality: N/A;  . NO PAST SURGERIES    . PACEMAKER IMPLANT N/A 08/28/2017   Procedure: PACEMAKER IMPLANT;  Surgeon: Thompson Grayer, MD;  Location: [facility];  Service: Cardiovascular;  Laterality: N/A;  . TEE WITHOUT CARDIOVERSION N/A 07/16/2017   Procedure: TRANSESOPHAGEAL ECHOCARDIOGRAM (TEE);  Surgeon: Lelon Perla, MD;  Location: [facility] ENDOSCOPY;  Service: Cardiovascular;  Laterality: N/A;  . TEE WITHOUT CARDIOVERSION N/A 08/23/2017   Procedure: TRANSESOPHAGEAL ECHOCARDIOGRAM (TEE);  Surgeon: Rexene Alberts, MD;  Location: [facility];  Service: Open Heart Surgery;  Laterality: N/A;  . THORACIC AORTIC ANEURYSM REPAIR N/A 08/23/2017   Procedure: THORACIC ASCENDING ANEURYSM REPAIR (AAA) USING HEMASHIELD GOLD KNITTED MICROVEL DOUBLE VELOUR VASCULAR GRAFT D: 24 MM  L: 30 CM;  Surgeon: Rexene Alberts, MD;  Location: [facility];  Service: Open Heart Surgery;  Laterality: N/A;    Current Outpatient Medications  Medication Sig Dispense Refill  . albuterol (PROVENTIL HFA;VENTOLIN HFA) 108 (90 Base) MCG/ACT inhaler Inhale 2 puffs into the lungs every 6 (six) hours as needed for wheezing or shortness of breath.     . allopurinol (ZYLOPRIM) 300 MG tablet Take 300 mg by mouth daily.    Marland Kitchen amiodarone (PACERONE) 200 MG tablet Take 1 tablet (200 mg total) by mouth 2 (two) times daily. 30 tablet 1  . aspirin EC 325 MG EC tablet Take 1 tablet (325 mg total) by mouth daily.    . chlorhexidine (PERIDEX) 0.12 % solution Rinse with 15 mls twice daily for 30 seconds. Use after breakfast and at bedtime.  Spit out excess. Do not swallow. 480 mL prn  . citalopram (CELEXA) 20 MG tablet Take 20 mg by mouth at bedtime.  2  . clonazePAM (KLONOPIN) 1 MG tablet Take 0.5-1 mg by mouth every 8 (eight) hours as needed for anxiety.    . docusate sodium (COLACE) 100 MG capsule Take 200 mg by mouth daily.     . furosemide (LASIX) 40 MG tablet Take 80 mg by mouth daily.    Marland Kitchen ipratropium (ATROVENT) 0.02 % nebulizer solution Take 0.5 mg by nebulization every 6 (six) hours as needed for wheezing or shortness of breath.    . lisinopril-hydrochlorothiazide (PRINZIDE,ZESTORETIC) 20-25 MG tablet Take 1 tablet by mouth daily.    . metoprolol succinate (TOPROL-XL) 100 MG 24 hr tablet Take 1 tablet (100 mg total) by mouth daily. Take with or immediately following a meal. 30 tablet 1  . oxyCODONE (OXY IR/ROXICODONE) 5 MG immediate release tablet Take 1-2 tablets (5-10 mg total) by mouth every 6 (six) hours as needed for up to 7 days for severe pain. 30 tablet 0  . potassium chloride (K-DUR,KLOR-CON) 10 MEQ tablet Take  10 mEq by mouth daily.    Marland Kitchen warfarin (COUMADIN) 5 MG tablet Take 1 tablet (5 mg total) by mouth daily at 6 PM. As directed by the coumadin clinic 100 tablet 1  . Probiotic Product (PROBIOTIC PO) Take 1 capsule by mouth at bedtime.     No current facility-administered medications for this encounter.     No Known Allergies  Social History   Socioeconomic History  . Marital status: Single    Spouse name: Not on file  . Number of children: Not on file  . Years of education: Not on file  . Highest education level: Not on file  Occupational History  . Not on file  Social Needs  . Financial resource strain: Not on file  . Food insecurity:    Worry: Not on file    Inability: Not on file  . Transportation needs:    Medical: Not on file    Non-medical: Not on file  Tobacco Use  . Smoking status: Current Every Day Smoker    Packs/day: 0.50    Types: Cigarettes  . Smokeless tobacco: Never Used    Substance and Sexual Activity  . Alcohol use: No    Frequency: Never    Comment: have had alcohol in the past, not heavy  . Drug use: No  . Sexual activity: Not on file  Lifestyle  . Physical activity:    Days per week: Not on file    Minutes per session: Not on file  . Stress: Not on file  Relationships  . Social connections:    Talks on phone: Not on file    Gets together: Not on file    Attends religious service: Not on file    Active member of club or organization: Not on file    Attends meetings of clubs or organizations: Not on file    Relationship status: Not on file  . Intimate partner violence:    Fear of current or ex partner: Not on file    Emotionally abused: Not on file    Physically abused: Not on file    Forced sexual activity: Not on file  Other Topics Concern  . Not on file  Social History Narrative  . Not on file    Family History  Problem Relation Age of Onset  . Hypertension Mother   . Alzheimer's disease Mother   . Heart attack Brother   . Diabetes Brother     ROS- All systems are reviewed and negative except as per the HPI above  Physical Exam: Vitals:   09/06/17 1344  BP: 118/68  Pulse: 72  Weight: (!) 425 lb 3.2 oz (192.9 kg)  Height: 5\' 7"  (1.702 m)   Wt Readings from Last 3 Encounters:  09/06/17 (!) 425 lb 3.2 oz (192.9 kg)  09/01/17 (!) 428 lb 12.7 oz (194.5 kg)  08/21/17 (!) 417 lb 7 oz (189.3 kg)    Labs: Lab Results  Component Value Date   NA 136 08/30/2017   K 4.7 08/30/2017   CL 101 08/30/2017   CO2 24 08/30/2017   GLUCOSE 107 (H) 08/30/2017   BUN 22 (H) 08/30/2017   CREATININE 1.06 08/30/2017   CALCIUM 8.8 (L) 08/30/2017   MG 2.3 08/30/2017   Lab Results  Component Value Date   INR 1.3 09/04/2017   No results found for: CHOL, HDL, LDLCALC, TRIG   GEN- The patient is well appearing, alert and oriented x 3 today.   Head- normocephalic, atraumatic  Eyes-  Sclera clear, conjunctiva pink Ears- hearing  intact Oropharynx- clear Neck- supple, no JVP Lymph- no cervical lymphadenopathy Lungs- Clear to ausculation bilaterally, normal work of breathing Heart- Regular rate and rhythm, no murmurs, rubs or gallops, PMI not laterally displaced. Surgical incisions without signs of infection. Steri strips intact PPM site. GI- 0bese, NT, ND, + BS Extremities- no clubbing, cyanosis, bilateral lower extremity edema, with compression  socks on, per pt 'much better" MS- no significant deformity or atrophy Skin- no rash or lesion Psych- euthymic mood, full affect Neuro- strength and sensation are intact  EKG- atrial paced rhythm at 72 bpm, PR int 166 ms, qrs int 160 ms, qtc 503 ms Epic records reviewed    Assessment and Plan: 1. S/p aortic valve replacement with bioprosthetic valve +repair /post op afib Continue amiodarone at 200 mg bid, dose reduction can be further decided on f/u with Bernerd Pho, [place] 5/3 Continue warfarin, INR  4/16 1.3 and is pending recheck 4/23 atrial paced today  2. PPM Site with intact steri strips Wound check 4/19 He is currently having transmission box issues Encouraged to take with him to wound check so device clinic can trouble shoot  3. Diastolic HF Weight appears stable Continue lasix Compression socks Avoid salt  4. HTN Stable  F/u with Bernerd Pho, [place] 5/3 Coumadin clinic 4/23 Device clinic 4/19  Dr. Roxy Manns 5/20 Dr. Rayann Heman 7/10  Geroge Baseman. Ghislaine Harcum, [facility] [address] [place], [place] [address] [phone]

## 2017-09-07 ENCOUNTER — Ambulatory Visit (INDEPENDENT_AMBULATORY_CARE_PROVIDER_SITE_OTHER): Payer: BLUE CROSS/BLUE SHIELD | Admitting: *Deleted

## 2017-09-07 DIAGNOSIS — I442 Atrioventricular block, complete: Secondary | ICD-10-CM

## 2017-09-07 DIAGNOSIS — I480 Paroxysmal atrial fibrillation: Secondary | ICD-10-CM | POA: Diagnosis not present

## 2017-09-07 DIAGNOSIS — Z950 Presence of cardiac pacemaker: Secondary | ICD-10-CM

## 2017-09-07 LAB — CUP PACEART INCLINIC DEVICE CHECK
Brady Statistic RV Percent Paced: 95 %
Date Time Interrogation Session: 20190419120050
Implantable Lead Implant Date: 20190409
Implantable Lead Location: 753859
Implantable Lead Location: 753860
Implantable Pulse Generator Implant Date: 20190409
Lead Channel Pacing Threshold Amplitude: 0.5 V
Lead Channel Pacing Threshold Amplitude: 0.75 V
Lead Channel Sensing Intrinsic Amplitude: 7 mV
Lead Channel Setting Pacing Amplitude: 3.5 V
Lead Channel Setting Pacing Amplitude: 3.5 V
Lead Channel Setting Pacing Pulse Width: 0.5 ms
Lead Channel Setting Sensing Sensitivity: 4 mV
MDC IDC LEAD IMPLANT DT: 20190409
MDC IDC MSMT BATTERY REMAINING LONGEVITY: 80 mo
MDC IDC MSMT BATTERY VOLTAGE: 3.11 V
MDC IDC MSMT LEADCHNL RA IMPEDANCE VALUE: 487.5 Ohm
MDC IDC MSMT LEADCHNL RA PACING THRESHOLD PULSEWIDTH: 0.5 ms
MDC IDC MSMT LEADCHNL RA SENSING INTR AMPL: 3.6 mV
MDC IDC MSMT LEADCHNL RV IMPEDANCE VALUE: 587.5 Ohm
MDC IDC MSMT LEADCHNL RV PACING THRESHOLD PULSEWIDTH: 0.5 ms
MDC IDC PG SERIAL: 9010865
MDC IDC STAT BRADY RA PERCENT PACED: 8 %
Pulse Gen Model: 2272

## 2017-09-07 NOTE — Patient Instructions (Addendum)
Wash site with antibacterial soap and water twice daily, pat dry.  If you note any signs/symptoms of infection, such as drainage, redness, swelling, fever, or chills, call the [facility] at [phone].

## 2017-09-07 NOTE — Progress Notes (Signed)
Wound check appointment. Steri-strips removed. Wound without redness or edema. Incision edges approximated, scab noted at L corner of incision, no drainage. GT assessed site, recommended washing site twice daily with antibacterial soap and water. Patient is reluctant to come back to [facility] for wound recheck due to distance. He agrees to follow wound care instructions and monitor for signs/symptoms of infection and call our office if any noted.   Normal device function. Thresholds, sensing, and impedances consistent with implant measurements. Device programmed at 3.5V for extra safety margin until 3 month visit. Histogram distribution appropriate for patient and level of activity. 27% AT/AF, +warfarin and amiodarone. No high ventricular rates noted. Patient educated about wound care, arm mobility, lifting restrictions, and Merlin monitor. ROV with JA on 11/28/17.  Patient gave verbal permission to include photo of wound (see below).

## 2017-09-10 ENCOUNTER — Telehealth: Payer: Self-pay

## 2017-09-10 NOTE — Telephone Encounter (Signed)
[name] with [facility] called and left a message for a call back concerning a cough that [name] developed Friday.  Left a voicemail to give the office a call.

## 2017-09-11 ENCOUNTER — Ambulatory Visit (INDEPENDENT_AMBULATORY_CARE_PROVIDER_SITE_OTHER): Payer: BLUE CROSS/BLUE SHIELD | Admitting: *Deleted

## 2017-09-11 DIAGNOSIS — Z953 Presence of xenogenic heart valve: Secondary | ICD-10-CM | POA: Diagnosis not present

## 2017-09-11 DIAGNOSIS — Z5181 Encounter for therapeutic drug level monitoring: Secondary | ICD-10-CM | POA: Diagnosis not present

## 2017-09-11 LAB — POCT INR: INR: 1.2

## 2017-09-11 NOTE — Patient Instructions (Signed)
Pt has been on coumadin 5mg  daily since d/c on 4/13.  D/C INR was 1.12 On Amiodarone 200mg  bid Take coumadin 2 tablets tonight and tomorrow night then increase dose to 1 1/2 tablets daily except 1 tablet on Mondays, Wednesdays and Fridays Recheck in 1 week

## 2017-09-12 ENCOUNTER — Telehealth: Payer: Self-pay | Admitting: *Deleted

## 2017-09-12 NOTE — Telephone Encounter (Signed)
Spoke with patient.  He reports that his PPM incision appears to be healing well.  Scab is smaller, incision edges remain approximated.  No drainage, redness, swelling, fever, or chills.  Patient is using antibacterial soap as instructed by Dr. Lovena Le.  Home health RN has assessed site and had no concerns per patient.  Patient reports that his infrasternal incision from AVR has had a small amount of dried serosanguineous drainage around the wound.  Per patient, [facility] RN cleaned it and told him that it did not look infected.  Reviewed signs/symptoms of infection with patient and encouraged him to call if any noted.  Patient verbalizes understanding.  Advised patient that Merlin monitor has not updated, suggesting that he has poor cell signal at home.  Patient is agreeable to receiving WiFi adapter.  Spoke with [employer] rep, who will order today.  Patient is aware to call if he needs any assistance setting up WiFi adapter.  He is appreciative and denies questions or concerns at this time.

## 2017-09-13 ENCOUNTER — Ambulatory Visit: Payer: Self-pay | Admitting: Student

## 2017-09-14 ENCOUNTER — Ambulatory Visit: Payer: BLUE CROSS/BLUE SHIELD | Admitting: Cardiovascular Disease

## 2017-09-17 ENCOUNTER — Telehealth: Payer: Self-pay | Admitting: Internal Medicine

## 2017-09-17 NOTE — Telephone Encounter (Signed)
New Message   STAT if HR is under 50 or over 120 (normal HR is 60-100 beats per minute)  1) What is your heart rate? 140 resting  2) Do you have a log of your heart rate readings (document readings)? 140 resting then went down to 38  3) Do you have any other symptoms? Blood pressure 104/44, 120/80  This happened Friday and today he feels ok but he wants to make sure he is ok. Please call

## 2017-09-17 NOTE — Telephone Encounter (Signed)
Reginald [name] says that his monitor doesn't work at home due to poor cell signal, he does not have a landline phone. He is currently waiting on an internet adapter for his monitor. I advised that his HR reading were likely abnormal due to atrial fibrillation and that his lower rate on his PPM is 60bpm. I offered that he could come to the [place] office, send a transmission from a public location with better signal, or be seen in [facility] 09/21/17. He will attempt to send a manual transmission today and call back for assistance if needed.

## 2017-09-17 NOTE — Telephone Encounter (Signed)
Will forward to Device clinic 

## 2017-09-17 NOTE — Telephone Encounter (Signed)
Patient called and stated that the monitor was malfunctioning. He stated that the tower light was on and the all five orange lights were blinking all at one time. Instructed pt to turn the monitor off and call tech support for help trouble shooting the monitor. In the mean time pt agreed to an appt w/ Device Clinic on 5-3 at 10:30 AM.

## 2017-09-18 ENCOUNTER — Emergency Department (HOSPITAL_COMMUNITY): Payer: BLUE CROSS/BLUE SHIELD

## 2017-09-18 ENCOUNTER — Other Ambulatory Visit: Payer: Self-pay

## 2017-09-18 ENCOUNTER — Telehealth: Payer: Self-pay | Admitting: Cardiovascular Disease

## 2017-09-18 ENCOUNTER — Ambulatory Visit (INDEPENDENT_AMBULATORY_CARE_PROVIDER_SITE_OTHER): Payer: BLUE CROSS/BLUE SHIELD | Admitting: *Deleted

## 2017-09-18 ENCOUNTER — Inpatient Hospital Stay (HOSPITAL_COMMUNITY)
Admission: EM | Admit: 2017-09-18 | Discharge: 2017-09-20 | DRG: 871 | Disposition: A | Discharge status: Home Health | Payer: BLUE CROSS/BLUE SHIELD | Attending: Internal Medicine | Admitting: Internal Medicine

## 2017-09-18 ENCOUNTER — Encounter (HOSPITAL_COMMUNITY): Payer: Self-pay

## 2017-09-18 DIAGNOSIS — Z5181 Encounter for therapeutic drug level monitoring: Secondary | ICD-10-CM | POA: Diagnosis not present

## 2017-09-18 DIAGNOSIS — D649 Anemia, unspecified: Secondary | ICD-10-CM | POA: Diagnosis present

## 2017-09-18 DIAGNOSIS — F419 Anxiety disorder, unspecified: Secondary | ICD-10-CM | POA: Diagnosis present

## 2017-09-18 DIAGNOSIS — M19011 Primary osteoarthritis, right shoulder: Secondary | ICD-10-CM | POA: Diagnosis present

## 2017-09-18 DIAGNOSIS — R509 Fever, unspecified: Secondary | ICD-10-CM | POA: Diagnosis present

## 2017-09-18 DIAGNOSIS — Z953 Presence of xenogenic heart valve: Secondary | ICD-10-CM

## 2017-09-18 DIAGNOSIS — J449 Chronic obstructive pulmonary disease, unspecified: Secondary | ICD-10-CM

## 2017-09-18 DIAGNOSIS — Z954 Presence of other heart-valve replacement: Secondary | ICD-10-CM

## 2017-09-18 DIAGNOSIS — I272 Pulmonary hypertension, unspecified: Secondary | ICD-10-CM | POA: Diagnosis present

## 2017-09-18 DIAGNOSIS — I97190 Other postprocedural cardiac functional disturbances following cardiac surgery: Secondary | ICD-10-CM

## 2017-09-18 DIAGNOSIS — I872 Venous insufficiency (chronic) (peripheral): Secondary | ICD-10-CM

## 2017-09-18 DIAGNOSIS — Z6841 Body Mass Index (BMI) 40.0 and over, adult: Secondary | ICD-10-CM | POA: Diagnosis not present

## 2017-09-18 DIAGNOSIS — E872 Acidosis: Secondary | ICD-10-CM | POA: Diagnosis present

## 2017-09-18 DIAGNOSIS — I5033 Acute on chronic diastolic (congestive) heart failure: Secondary | ICD-10-CM

## 2017-09-18 DIAGNOSIS — F418 Other specified anxiety disorders: Secondary | ICD-10-CM | POA: Diagnosis not present

## 2017-09-18 DIAGNOSIS — I4510 Unspecified right bundle-branch block: Secondary | ICD-10-CM

## 2017-09-18 DIAGNOSIS — I4891 Unspecified atrial fibrillation: Secondary | ICD-10-CM | POA: Diagnosis present

## 2017-09-18 DIAGNOSIS — R791 Abnormal coagulation profile: Secondary | ICD-10-CM | POA: Diagnosis not present

## 2017-09-18 DIAGNOSIS — I2699 Other pulmonary embolism without acute cor pulmonale: Secondary | ICD-10-CM | POA: Diagnosis present

## 2017-09-18 DIAGNOSIS — M19012 Primary osteoarthritis, left shoulder: Secondary | ICD-10-CM | POA: Diagnosis present

## 2017-09-18 DIAGNOSIS — G4733 Obstructive sleep apnea (adult) (pediatric): Secondary | ICD-10-CM

## 2017-09-18 DIAGNOSIS — A419 Sepsis, unspecified organism: Principal | ICD-10-CM

## 2017-09-18 DIAGNOSIS — Z95828 Presence of other vascular implants and grafts: Secondary | ICD-10-CM

## 2017-09-18 DIAGNOSIS — Z79899 Other long term (current) drug therapy: Secondary | ICD-10-CM | POA: Diagnosis not present

## 2017-09-18 DIAGNOSIS — Z7982 Long term (current) use of aspirin: Secondary | ICD-10-CM | POA: Diagnosis not present

## 2017-09-18 DIAGNOSIS — Z8679 Personal history of other diseases of the circulatory system: Secondary | ICD-10-CM

## 2017-09-18 DIAGNOSIS — Z7951 Long term (current) use of inhaled steroids: Secondary | ICD-10-CM

## 2017-09-18 DIAGNOSIS — I110 Hypertensive heart disease with heart failure: Secondary | ICD-10-CM | POA: Diagnosis present

## 2017-09-18 DIAGNOSIS — M109 Gout, unspecified: Secondary | ICD-10-CM

## 2017-09-18 DIAGNOSIS — I10 Essential (primary) hypertension: Secondary | ICD-10-CM | POA: Diagnosis present

## 2017-09-18 DIAGNOSIS — N390 Urinary tract infection, site not specified: Secondary | ICD-10-CM | POA: Diagnosis present

## 2017-09-18 DIAGNOSIS — B9689 Other specified bacterial agents as the cause of diseases classified elsewhere: Secondary | ICD-10-CM | POA: Diagnosis not present

## 2017-09-18 DIAGNOSIS — I712 Thoracic aortic aneurysm, without rupture: Secondary | ICD-10-CM | POA: Diagnosis present

## 2017-09-18 DIAGNOSIS — Z7901 Long term (current) use of anticoagulants: Secondary | ICD-10-CM | POA: Diagnosis not present

## 2017-09-18 DIAGNOSIS — F17210 Nicotine dependence, cigarettes, uncomplicated: Secondary | ICD-10-CM

## 2017-09-18 DIAGNOSIS — M7989 Other specified soft tissue disorders: Secondary | ICD-10-CM | POA: Diagnosis not present

## 2017-09-18 DIAGNOSIS — I5032 Chronic diastolic (congestive) heart failure: Secondary | ICD-10-CM | POA: Diagnosis present

## 2017-09-18 DIAGNOSIS — F329 Major depressive disorder, single episode, unspecified: Secondary | ICD-10-CM | POA: Diagnosis present

## 2017-09-18 DIAGNOSIS — Z950 Presence of cardiac pacemaker: Secondary | ICD-10-CM

## 2017-09-18 DIAGNOSIS — R011 Cardiac murmur, unspecified: Secondary | ICD-10-CM

## 2017-09-18 DIAGNOSIS — Z9889 Other specified postprocedural states: Secondary | ICD-10-CM

## 2017-09-18 DIAGNOSIS — G8929 Other chronic pain: Secondary | ICD-10-CM | POA: Diagnosis not present

## 2017-09-18 LAB — COMPREHENSIVE METABOLIC PANEL
ALT: 24 U/L (ref 17–63)
AST: 21 U/L (ref 15–41)
Albumin: 3.3 g/dL — ABNORMAL LOW (ref 3.5–5.0)
Alkaline Phosphatase: 94 U/L (ref 38–126)
Anion gap: 11 (ref 5–15)
BUN: 20 mg/dL (ref 6–20)
CHLORIDE: 100 mmol/L — AB (ref 101–111)
CO2: 24 mmol/L (ref 22–32)
CREATININE: 1.08 mg/dL (ref 0.61–1.24)
Calcium: 9.1 mg/dL (ref 8.9–10.3)
GFR calc Af Amer: >60 mL/min (ref >60)
GLUCOSE: 151 mg/dL — AB (ref 65–99)
Potassium: 3.7 mmol/L (ref 3.5–5.1)
Sodium: 135 mmol/L (ref 135–145)
Total Bilirubin: 0.8 mg/dL (ref 0.3–1.2)
Total Protein: 7.8 g/dL (ref 6.5–8.1)

## 2017-09-18 LAB — CBC WITH DIFFERENTIAL/PLATELET
Basophils Absolute: 0 10*3/uL (ref 0.0–0.1)
Basophils Relative: 0 %
Eosinophils Absolute: 0.2 10*3/uL (ref 0.0–0.7)
Eosinophils Relative: 1 %
HEMATOCRIT: 38.4 % — AB (ref 39.0–52.0)
Hemoglobin: 12.2 g/dL — ABNORMAL LOW (ref 13.0–17.0)
LYMPHS ABS: 1.3 10*3/uL (ref 0.7–4.0)
LYMPHS PCT: 8 %
MCH: 25.8 pg — ABNORMAL LOW (ref 26.0–34.0)
MCHC: 31.8 g/dL (ref 30.0–36.0)
MCV: 81.4 fL (ref 78.0–100.0)
MONO ABS: 0.8 10*3/uL (ref 0.1–1.0)
MONOS PCT: 5 %
NEUTROS ABS: 14.4 10*3/uL — AB (ref 1.7–7.7)
Neutrophils Relative %: 86 %
Platelets: 326 10*3/uL (ref 150–400)
RBC: 4.72 MIL/uL (ref 4.22–5.81)
RDW: 15.6 % — AB (ref 11.5–15.5)
WBC: 16.7 10*3/uL — ABNORMAL HIGH (ref 4.0–10.5)

## 2017-09-18 LAB — URINALYSIS, ROUTINE W REFLEX MICROSCOPIC
BILIRUBIN URINE: NEGATIVE
Glucose, UA: NEGATIVE mg/dL
Hgb urine dipstick: NEGATIVE
Ketones, ur: NEGATIVE mg/dL
Nitrite: NEGATIVE
Protein, ur: NEGATIVE mg/dL
SPECIFIC GRAVITY, URINE: 1.01 (ref 1.005–1.030)
pH: 5 (ref 5.0–8.0)

## 2017-09-18 LAB — I-STAT CG4 LACTIC ACID, ED
LACTIC ACID, VENOUS: 2.33 mmol/L — AB (ref 0.5–1.9)
LACTIC ACID, VENOUS: 2.03 mmol/L — AB (ref 0.5–1.9)

## 2017-09-18 LAB — HEPARIN LEVEL (UNFRACTIONATED)

## 2017-09-18 LAB — LACTIC ACID, PLASMA: Lactic Acid, Venous: 1.2 mmol/L (ref 0.5–1.9)

## 2017-09-18 LAB — BRAIN NATRIURETIC PEPTIDE: B NATRIURETIC PEPTIDE 5: 318.2 pg/mL — AB (ref 0.0–100.0)

## 2017-09-18 LAB — I-STAT TROPONIN, ED: Troponin i, poc: 0 ng/mL (ref 0.00–0.08)

## 2017-09-18 LAB — PROTIME-INR
INR: 1.29
Prothrombin Time: 16 seconds — ABNORMAL HIGH (ref 11.4–15.2)

## 2017-09-18 LAB — POCT INR: INR: 1.4

## 2017-09-18 LAB — PROCALCITONIN: Procalcitonin: <0.1 ng/mL

## 2017-09-18 MED ORDER — LACTATED RINGERS IV BOLUS
1000.0000 mL | Freq: Once | INTRAVENOUS | Status: AC
  Administered 2017-09-18: 1000 mL via INTRAVENOUS

## 2017-09-18 MED ORDER — VANCOMYCIN HCL 500 MG IV SOLR
500.0000 mg | Freq: Once | INTRAVENOUS | Status: AC
  Administered 2017-09-18: 500 mg via INTRAVENOUS
  Filled 2017-09-18: qty 500

## 2017-09-18 MED ORDER — DIPHENHYDRAMINE HCL 50 MG/ML IJ SOLN
25.0000 mg | Freq: Once | INTRAMUSCULAR | Status: AC
  Administered 2017-09-18: 25 mg via INTRAVENOUS
  Filled 2017-09-18: qty 1

## 2017-09-18 MED ORDER — HEPARIN BOLUS VIA INFUSION
7500.0000 [IU] | Freq: Once | INTRAVENOUS | Status: AC
Start: 2017-09-18 — End: 2017-09-18
  Administered 2017-09-18: 7500 [IU] via INTRAVENOUS
  Filled 2017-09-18: qty 7500

## 2017-09-18 MED ORDER — ASPIRIN EC 325 MG PO TBEC
325.0000 mg | DELAYED_RELEASE_TABLET | Freq: Every day | ORAL | Status: DC
  Administered 2017-09-19 – 2017-09-20 (×2): 325 mg via ORAL
  Filled 2017-09-18 (×2): qty 1

## 2017-09-18 MED ORDER — VANCOMYCIN HCL IN DEXTROSE 1-5 GM/200ML-% IV SOLN
1000.0000 mg | Freq: Once | INTRAVENOUS | Status: AC
  Administered 2017-09-18: 1000 mg via INTRAVENOUS
  Filled 2017-09-18: qty 200

## 2017-09-18 MED ORDER — ACETAMINOPHEN 650 MG RE SUPP
650.0000 mg | Freq: Four times a day (QID) | RECTAL | Status: DC | PRN
Start: 2017-09-18 — End: 2017-09-20

## 2017-09-18 MED ORDER — PROCHLORPERAZINE EDISYLATE 10 MG/2ML IJ SOLN
10.0000 mg | Freq: Once | INTRAMUSCULAR | Status: AC
Start: 2017-09-18 — End: 2017-09-18
  Administered 2017-09-18: 10 mg via INTRAVENOUS
  Filled 2017-09-18: qty 2

## 2017-09-18 MED ORDER — SODIUM CHLORIDE 0.9% FLUSH
3.0000 mL | Freq: Two times a day (BID) | INTRAVENOUS | Status: DC
  Administered 2017-09-18 – 2017-09-20 (×3): 3 mL via INTRAVENOUS

## 2017-09-18 MED ORDER — PIPERACILLIN-TAZOBACTAM 3.375 G IVPB
3.3750 g | Freq: Three times a day (TID) | INTRAVENOUS | Status: DC
  Administered 2017-09-19 – 2017-09-20 (×5): 3.375 g via INTRAVENOUS
  Filled 2017-09-18 (×7): qty 50

## 2017-09-18 MED ORDER — PIPERACILLIN-TAZOBACTAM 3.375 G IVPB 30 MIN
3.3750 g | Freq: Once | INTRAVENOUS | Status: AC
  Administered 2017-09-18: 3.375 g via INTRAVENOUS
  Filled 2017-09-18: qty 50

## 2017-09-18 MED ORDER — HEPARIN BOLUS VIA INFUSION
5000.0000 [IU] | Freq: Once | INTRAVENOUS | Status: AC
  Administered 2017-09-18: 5000 [IU] via INTRAVENOUS
  Filled 2017-09-18: qty 5000

## 2017-09-18 MED ORDER — CITALOPRAM HYDROBROMIDE 20 MG PO TABS
20.0000 mg | ORAL_TABLET | Freq: Every day | ORAL | Status: DC
  Administered 2017-09-18 – 2017-09-19 (×2): 20 mg via ORAL
  Filled 2017-09-18: qty 1
  Filled 2017-09-18: qty 2
  Filled 2017-09-18: qty 1

## 2017-09-18 MED ORDER — FUROSEMIDE 40 MG PO TABS
40.0000 mg | ORAL_TABLET | Freq: Every day | ORAL | Status: DC
  Administered 2017-09-19 – 2017-09-20 (×2): 40 mg via ORAL
  Filled 2017-09-18 (×2): qty 1

## 2017-09-18 MED ORDER — METOPROLOL SUCCINATE ER 100 MG PO TB24
100.0000 mg | ORAL_TABLET | Freq: Every day | ORAL | Status: DC
  Administered 2017-09-19 – 2017-09-20 (×2): 100 mg via ORAL
  Filled 2017-09-18 (×2): qty 1

## 2017-09-18 MED ORDER — POLYETHYLENE GLYCOL 3350 17 G PO PACK
17.0000 g | PACK | Freq: Every day | ORAL | Status: DC | PRN
  Administered 2017-09-18: 17 g via ORAL
  Filled 2017-09-18: qty 1

## 2017-09-18 MED ORDER — ACETAMINOPHEN 325 MG PO TABS
650.0000 mg | ORAL_TABLET | Freq: Once | ORAL | Status: AC | PRN
  Administered 2017-09-18: 650 mg via ORAL
  Filled 2017-09-18: qty 2

## 2017-09-18 MED ORDER — IBUPROFEN 600 MG PO TABS
600.0000 mg | ORAL_TABLET | Freq: Four times a day (QID) | ORAL | Status: DC | PRN
  Administered 2017-09-18 – 2017-09-19 (×2): 600 mg via ORAL
  Filled 2017-09-18 (×2): qty 1

## 2017-09-18 MED ORDER — IOPAMIDOL (ISOVUE-370) INJECTION 76%
INTRAVENOUS | Status: AC
  Administered 2017-09-18: 100 mL
  Filled 2017-09-18: qty 100

## 2017-09-18 MED ORDER — CLONAZEPAM 0.5 MG PO TABS
0.5000 mg | ORAL_TABLET | Freq: Two times a day (BID) | ORAL | Status: DC | PRN
Start: 2017-09-18 — End: 2017-09-20
  Administered 2017-09-18 – 2017-09-19 (×2): 0.5 mg via ORAL
  Filled 2017-09-18 (×2): qty 1

## 2017-09-18 MED ORDER — AMIODARONE HCL 200 MG PO TABS
200.0000 mg | ORAL_TABLET | Freq: Two times a day (BID) | ORAL | Status: DC
  Administered 2017-09-18 – 2017-09-20 (×4): 200 mg via ORAL
  Filled 2017-09-18 (×4): qty 1

## 2017-09-18 MED ORDER — VANCOMYCIN HCL 10 G IV SOLR
1500.0000 mg | Freq: Three times a day (TID) | INTRAVENOUS | Status: DC
  Administered 2017-09-19 – 2017-09-20 (×5): 1500 mg via INTRAVENOUS
  Filled 2017-09-18 (×8): qty 1500

## 2017-09-18 MED ORDER — HEPARIN (PORCINE) IN NACL 100-0.45 UNIT/ML-% IJ SOLN
3600.0000 [IU]/h | INTRAMUSCULAR | Status: DC
  Administered 2017-09-18: 2000 [IU]/h via INTRAVENOUS
  Administered 2017-09-19: 2500 [IU]/h via INTRAVENOUS
  Administered 2017-09-19: 3200 [IU]/h via INTRAVENOUS
  Administered 2017-09-20: 3600 [IU]/h via INTRAVENOUS
  Filled 2017-09-18 (×5): qty 250

## 2017-09-18 MED ORDER — ACETAMINOPHEN 325 MG PO TABS
650.0000 mg | ORAL_TABLET | Freq: Four times a day (QID) | ORAL | Status: DC | PRN
  Filled 2017-09-18: qty 2

## 2017-09-18 NOTE — Telephone Encounter (Signed)
Pt says for the last 2 days has the chills and overall achy and in pain had valve replacement beginning of the month. Fever today is 102.2 in office here for INR check. With pt recent valve replacement and symptoms advised pt to report to [facility] ED for further evaluation - pt agreeable and will have brother drive him. Wants to have INR check while he is here for already scheduled appt.

## 2017-09-18 NOTE — Assessment & Plan Note (Deleted)
Found on CTA 4/30, mild to moderate bilateral.

## 2017-09-18 NOTE — Progress Notes (Signed)
Pharmacy Antibiotic Note  Reginald [name] is a 47 y.o. male admitted on 09/18/2017 with sepsis.    Plan: Zosyn 3.375 gm iv q8h Vanc 1 g x 1, then 500 mg x 1 then 1500 mg q8h Monitor renal fx cx vt prn     Temp (24hrs), Avg:101 F (38.3 C), Min:101 F (38.3 C), Max:101 F (38.3 C)  Recent Labs  Lab 09/18/17 0913 09/18/17 0925  WBC 16.7*  --   CREATININE 1.08  --   LATICACIDVEN  --  2.33*    Estimated Creatinine Clearance: 141.2 mL/min (by C-G formula based on SCr of 1.08 mg/dL).    No Known Allergies  Levester Fresh, PharmD, BCPS, BCCCP Clinical Pharmacist Clinical phone for 09/18/2017 from 7a-3:30p: [phone] If after 3:30p, please call main pharmacy at: [phone] 09/18/2017 11:18 AM

## 2017-09-18 NOTE — Progress Notes (Signed)
ANTICOAGULATION CONSULT NOTE - Follow Up Consult  Pharmacy Consult for heparin Indication: pulmonary embolus  Labs: Recent Labs    09/18/17 0739 09/18/17 0913 09/18/17 2204  HGB  --  12.2*  --   HCT  --  38.4*  --   PLT  --  326  --   LABPROT  --  16.0*  --   INR 1.4 1.29  --   HEPARINUNFRC  --   --  <0.10*  CREATININE  --  1.08  --     Assessment: 47yo male undetectable on heparin with initial dosing for PE; no gtt issues or signs of bleeding per RN.  Goal of Therapy:  Heparin level 0.3-0.7 units/ml   Plan:  Will rebolus with heparin 5000 units and increase heparin gtt by 4 units/kgABW/hr to 2500 units/hr and check level in 6 hours.    Wynona Neat, PharmD, BCPS  09/18/2017,11:29 PM

## 2017-09-18 NOTE — Progress Notes (Signed)
Pt has home CPAP at bedside.  RT assistance is not needed at this time.

## 2017-09-18 NOTE — ED Provider Notes (Signed)
[facility] EMERGENCY DEPARTMENT Provider Note   CSN: [id number] Arrival date & time: 09/18/17  7253     History   Chief Complaint Chief Complaint  Patient presents with  . Chest Pain    HPI Reginald [name] is a 47 y.o. male.  47yo M w/ extensive PMH including CHF, aortic stenosis s/p valve replacement, thoracic aortic aneurysm status post repair, hypertension, COPD, morbid obesity who presents with fever and chills.  The patient had aortic valve replacement, thoracic aneurysm repair, and pacemaker placement approximately 3 weeks ago.  He states he has been recovering well at home and has had pleuritic chest pain since the surgery.  He states that over the past few days he has had some fevers and chills but thought that the chills were related to Coumadin use.  He had his INR checked today and was noted to have a fever of 102, sent to the ED for further evaluation.  He has had a cough productive of brown sputum that has not been severe or worsening recently. His shortness of breath is stable with no recent worsening. LE edema is currently better than usual, no major increase in weight. He denies any vomiting, diarrhea, or urinary symptoms.  He has had mild drainage from his central chest wound that has not been worse recently. He has been having pain across his lower chest and into his back that is worse when he lays flat, but he has had this for a while after surgery and thought it was usual post-surgical pain.  The history is provided by the patient.  Chest Pain      Past Medical History:  Diagnosis Date  . Anxiety   . Aortic stenosis   . Aortic stenosis   . Asthma   . Back pain   . Cellulitis of skin with lymphangitis   . CHF (congestive heart failure) ([place])   . Chronic diastolic congestive heart failure ([place])   . Chronic venous insufficiency   . Constipation   . COPD (chronic obstructive pulmonary disease) ([place])   . Dyspnea   . Essential hypertension   .  Gout   . Hypertension   . Morbid obesity ([place])   . Obesity   . Pneumonia    walking pneumonia  . Prostatitis   . Pulmonary hypertension ([place])   . S/P aortic valve replacement with bioprosthetic valve 08/23/2017   25 mm Edwards Inspiris Resilia stented bovine pericardial tissue valve  . 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 Sleep apnea    cpap   . Thoracic ascending aortic aneurysm ([place])   . Thoracic ascending aortic aneurysm ([place])   . Tobacco abuse     Patient Active Problem List   Diagnosis Date Noted  . Encounter for therapeutic drug monitoring 09/04/2017  . 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 08/23/2017  . 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 Pulmonary hypertension ([place])   . Chronic periodontitis 07/18/2017  . Severe aortic stenosis   . Aortic stenosis   . Thoracic ascending aortic aneurysm ([place])   . Morbid obesity ([place])   . Essential hypertension   . Tobacco abuse   . Chronic diastolic congestive heart failure ([place])   . Chronic venous insufficiency     Past Surgical History:  Procedure Laterality Date  . AORTIC VALVE REPLACEMENT N/A 08/23/2017   Procedure: AORTIC VALVE REPLACEMENT (AVR) USING INSPIRIS RESILIA  AORTIC VALVE SIZE 25 MM;  Surgeon: Rexene Alberts, MD;  Location: [facility];  Service: Open Heart Surgery;  Laterality: N/A;  . MULTIPLE EXTRACTIONS WITH ALVEOLOPLASTY N/A 07/23/2017   Procedure: Extraction of tooth #10 with alveoloplasty and gross debridement of remaining teeth;  Surgeon: Lenn Cal, DDS;  Location: [facility];  Service: Oral Surgery;  Laterality: N/A;  . NO PAST SURGERIES    . PACEMAKER IMPLANT N/A 08/28/2017   Procedure: PACEMAKER IMPLANT;  Surgeon: Thompson Grayer, MD;  Location: [facility];  Service: Cardiovascular;  Laterality: N/A;  . TEE WITHOUT CARDIOVERSION N/A 07/16/2017   Procedure: TRANSESOPHAGEAL  ECHOCARDIOGRAM (TEE);  Surgeon: Lelon Perla, MD;  Location: [facility] ENDOSCOPY;  Service: Cardiovascular;  Laterality: N/A;  . TEE WITHOUT CARDIOVERSION N/A 08/23/2017   Procedure: TRANSESOPHAGEAL ECHOCARDIOGRAM (TEE);  Surgeon: Rexene Alberts, MD;  Location: [facility];  Service: Open Heart Surgery;  Laterality: N/A;  . THORACIC AORTIC ANEURYSM REPAIR N/A 08/23/2017   Procedure: THORACIC ASCENDING ANEURYSM REPAIR (AAA) USING HEMASHIELD GOLD KNITTED MICROVEL DOUBLE VELOUR VASCULAR GRAFT D: 24 MM  L: 30 CM;  Surgeon: Rexene Alberts, MD;  Location: [facility];  Service: Open Heart Surgery;  Laterality: N/A;        Home Medications    Prior to Admission medications   Medication Sig Start Date End Date Taking? Authorizing Provider  acetaminophen (TYLENOL) 325 MG tablet Take 650 mg by mouth every 6 (six) hours as needed for mild pain.   Yes [provider]  albuterol (PROVENTIL HFA;VENTOLIN HFA) 108 (90 Base) MCG/ACT inhaler Inhale 2 puffs into the lungs every 6 (six) hours as needed for wheezing or shortness of breath.    Yes [provider]  allopurinol (ZYLOPRIM) 300 MG tablet Take 300 mg by mouth daily.   Yes [provider]  amiodarone (PACERONE) 200 MG tablet Take 1 tablet (200 mg total) by mouth 2 (two) times daily. 09/01/17  Yes Gold, Wilder Glade, PA-C  aspirin EC 325 MG EC tablet Take 1 tablet (325 mg total) by mouth daily. 09/01/17  Yes Gold, Wayne E, PA-C  chlorhexidine (PERIDEX) 0.12 % solution Rinse with 15 mls twice daily for 30 seconds. Use after breakfast and at bedtime. Spit out excess. Do not swallow. Patient taking differently: Use as directed 15 mLs in the mouth or throat 2 (two) times daily. Rinse with 15 mls twice daily for 30 seconds. Use after breakfast and at bedtime. Spit out excess. Do not swallow. 07/18/17  Yes Lenn Cal, DDS  citalopram (CELEXA) 20 MG tablet Take 20 mg by mouth at bedtime. 08/12/17  Yes [provider]  clonazePAM (KLONOPIN) 1  MG tablet Take 0.5-1 mg by mouth every 8 (eight) hours as needed for anxiety.   Yes [provider]  docusate sodium (COLACE) 100 MG capsule Take 200 mg by mouth daily.    Yes [provider]  furosemide (LASIX) 40 MG tablet Take 40-80 mg by mouth daily.    Yes [provider]  ipratropium (ATROVENT) 0.02 % nebulizer solution Take 0.5 mg by nebulization every 6 (six) hours as needed for wheezing or shortness of breath.   Yes [provider]  lisinopril-hydrochlorothiazide (PRINZIDE,ZESTORETIC) 20-25 MG tablet Take 1 tablet by mouth daily.   Yes [provider]  metoprolol succinate (TOPROL-XL) 100 MG 24 hr tablet Take 1 tablet (100 mg total) by mouth daily. Take with or immediately following a meal. 09/01/17  Yes Gold, Wilder Glade, PA-C  potassium chloride (K-DUR,KLOR-CON) 10 MEQ tablet Take 10 mEq by mouth daily.   Yes [provider]  Probiotic Product (PROBIOTIC PO) Take 1 capsule by mouth at bedtime.   Yes [provider]  warfarin (COUMADIN) 5 MG tablet Take 1 tablet (5 mg total) by mouth daily at 6 PM. As directed by the coumadin clinic Patient taking differently: Take 5 mg by mouth daily at 6 PM.  09/01/17  Yes Gold, Wilder Glade, PA-C    Family History Family History  Problem Relation Age of Onset  . Hypertension Mother   . Alzheimer's disease Mother   . Heart attack Brother   . Diabetes Brother     Social History Social History   Tobacco Use  . Smoking status: Current Every Day Smoker    Packs/day: 0.50    Types: Cigarettes  . Smokeless tobacco: Never Used  Substance Use Topics  . Alcohol use: No    Frequency: Never    Comment: have had alcohol in the past, not heavy  . Drug use: No     Allergies   Patient has no known allergies.   Review of Systems Review of Systems  Cardiovascular: Positive for chest pain.   All other systems reviewed and are negative except that which was mentioned in HPI   Physical  Exam Updated Vital Signs BP 120/64   Pulse 68   Temp (!) 101 F (38.3 C) (Oral)   Resp 17   Ht 5\' 7"  (1.702 m)   Wt (!) 192.9 kg (425 lb 4.3 oz)   SpO2 100%   BMI 66.61 kg/m   Physical Exam  Constitutional: He is oriented to person, place, and time. He appears well-developed and well-nourished. No distress.  Morbidly obese male, dyspneic but NAD  HENT:  Head: Normocephalic and atraumatic.  Moist mucous membranes  Eyes: Pupils are equal, round, and reactive to light. Conjunctivae are normal.  Neck: Neck supple.  Cardiovascular: Normal rate and regular rhythm.  Murmur heard.  Systolic murmur is present. Pulmonary/Chest: Breath sounds normal. No accessory muscle usage. No respiratory distress. He has no wheezes. He has no rales.  Dyspneic with mildly increased WOB  Abdominal: Soft. Bowel sounds are normal. He exhibits no distension. There is no tenderness.  Musculoskeletal:       Right lower leg: He exhibits edema.       Left lower leg: He exhibits edema.  Neurological: He is alert and oriented to person, place, and time.  Fluent speech  Skin: Skin is warm and dry.  Small amount of serous drainage at distal end of sternotomy scar, no frank pus; incision site on L chest over pacemaker and drain incision sites clean/dry/intact, non-tender  Psychiatric: He has a normal mood and affect. Judgment normal.  Nursing note and vitals reviewed.    ED Treatments / Results  Labs (all labs ordered are listed, but only abnormal results are displayed) Labs Reviewed  COMPREHENSIVE METABOLIC PANEL - Abnormal; Notable for the following components:      Result Value   Chloride 100 (*)    Glucose, Bld 151 (*)    Albumin 3.3 (*)    All other components within normal limits  CBC WITH DIFFERENTIAL/PLATELET - Abnormal; Notable for the following components:   WBC 16.7 (*)    Hemoglobin 12.2 (*)    HCT 38.4 (*)    MCH 25.8 (*)    RDW 15.6 (*)    Neutro Abs 14.4 (*)    All other components  within normal limits  PROTIME-INR - Abnormal; Notable for the following components:   Prothrombin Time 16.0 (*)    All other components within normal limits  URINALYSIS, ROUTINE W REFLEX MICROSCOPIC - Abnormal; Notable for the following components:   Color, Urine STRAW (*)    Leukocytes, UA MODERATE (*)    Bacteria, UA RARE (*)    All other components within normal limits  BRAIN NATRIURETIC PEPTIDE - Abnormal; Notable for the following components:   B Natriuretic Peptide 318.2 (*)    All other components within normal limits  I-STAT CG4 LACTIC ACID, ED - Abnormal; Notable for the following components:   Lactic Acid, Venous 2.33 (*)    All other components within normal limits  I-STAT CG4 LACTIC ACID, ED - Abnormal; Notable for the following components:   Lactic Acid, Venous 2.03 (*)    All other components within normal limits  CULTURE, BLOOD (ROUTINE X 2)  CULTURE, BLOOD (ROUTINE X 2)  URINE CULTURE  HEPARIN LEVEL (UNFRACTIONATED)  I-STAT TROPONIN, ED    EKG EKG Interpretation  Date/Time:  Tuesday September 18 2017 09:11:33 EDT Ventricular Rate:  83 PR Interval:  176 QRS Duration: 142 QT Interval:  412 QTC Calculation: 484 R Axis:   146 Text Interpretation:  Normal sinus rhythm Right bundle branch block Anteroseptal infarct , age undetermined Abnormal ECG overall similar to previous Confirmed by Theotis Burrow [phone]) on 09/18/2017 9:48:31 AM   Radiology Dg Chest 2 View  Result Date: 09/18/2017 CLINICAL DATA:  Chest pain with shortness of breath and fever EXAM: CHEST - 2 VIEW COMPARISON:  August 29, 2017 FINDINGS: There is atelectatic change in the posterior right base. Lungs elsewhere are clear. Heart is borderline enlarged with pulmonary vascularity within normal limits. Pacemaker leads are attached to the right atrium and right ventricle. The patient has had a previous aortic valve replacement. No evident adenopathy. No bone lesions. IMPRESSION: Atelectatic change posterior  right base. Lungs elsewhere clear. Status post aortic valve replacement. Pacemaker leads attached to right atrium and right ventricle. Heart borderline enlarged. Electronically Signed   By: Lowella Grip III M.D.   On: 09/18/2017 09:53   Ct Angio Chest Pe W/cm &/or Wo Cm  Result Date: 09/18/2017 CLINICAL DATA:  Chest pain and fever. Pacemaker placed 3 weeks ago. Status post aortic valve replacement and ascending thoracic aortic repair. EXAM: CT ANGIOGRAPHY CHEST WITH CONTRAST TECHNIQUE: Multidetector CT imaging of the chest was performed using the standard protocol during bolus administration of intravenous contrast. Multiplanar CT image reconstructions and MIPs were obtained to evaluate the vascular anatomy. CONTRAST:  130mL ISOVUE-370 IOPAMIDOL (ISOVUE-370) INJECTION 76% COMPARISON:  Chest radiographs obtained earlier today and chest, abdomen and pelvis CTA dated 07/26/2017. FINDINGS: Cardiovascular: Suboptimally opacified pulmonary arteries with a moderate-sized elongated filling defect within the left lower lobe pulmonary artery and extending into its branches. Probable small bilateral right lower lobe pulmonary artery branch filling defects. No evidence of right heart strain. Enlarged heart. Bipolar left subclavian pacemaker with satisfactory position of the leads. Interval small pericardial effusion with a maximum thickness of 13 mm. Again noted is the left common carotid artery arising from the innominate artery. A prosthetic aortic valve is in place. The previously demonstrated enlargement of the ascending thoracic aorta is no longer demonstrated. The ascending thoracic aorta currently has a maximum diameter of 3.2 cm at the level of the main pulmonary artery. Mediastinum/Nodes: Mildly enlarged mediastinal nodes including a right lower paratracheal node with a short axis diameter of  13 mm on image number 44 series 5, previously 11 mm. A subcarinal node has a short axis diameter of 11 mm on image  number 57 of series 5, previously 11 mm. Unremarkable esophagus and included portion of the thyroid gland. Lungs/Pleura: Interval right lower lobe atelectasis. Interval minimal bilateral pleural fluid. Upper Abdomen: Mildly enlarged celiac axis and porta hepatis lymph nodes. These include a celiac axis node with a short axis diameter of 15 mm on image number 150 of series 5, previously 14 mm. A porta hepatis node has a short axis diameter of 14 mm on image number 150 of series 5, previously 12 mm. Musculoskeletal: Thoracic spine degenerative changes. Review of the MIP images confirms the above findings. IMPRESSION: 1. Mild-to-moderate amount of acute bilateral pulmonary emboli, greater on the left, without evidence of right heart strain. 2. Interval right lower lobe atelectasis with no findings suspicious for pneumonia. 3. Interval minimal bilateral pleural fluid and small pericardial effusion. 4. Status post aortic valve repair and ascending thoracic aortic aneurysm repair. 5. Mild mediastinal and upper abdominal adenopathy with minimal progression. This is most likely reactive. 6. Critical Value/emergent results were called by telephone at the time of interpretation on 09/18/2017 at 12:53 pm to Dr. Theotis Burrow , who verbally acknowledged these results. Electronically Signed   By: Claudie Revering M.D.   On: 09/18/2017 12:55    Procedures .Critical Care Performed by: Sharlett Iles, MD Authorized by: Sharlett Iles, MD   Critical care provider statement:    Critical care time (minutes):  30   Critical care time was exclusive of:  Separately billable procedures and treating other patients   Critical care was necessary to treat or prevent imminent or life-threatening deterioration of the following conditions: Pulmonary embolism.   Critical care was time spent personally by me on the following activities:  Development of treatment plan with patient or surrogate, discussions with consultants,  evaluation of patient's response to treatment, examination of patient, obtaining history from patient or surrogate, ordering and performing treatments and interventions, ordering and review of laboratory studies, ordering and review of radiographic studies, re-evaluation of patient's condition and review of old charts   (including critical care time)  Medications Ordered in ED Medications  vancomycin (VANCOCIN) 500 mg in sodium chloride 0.9 % 100 mL IVPB (500 mg Intravenous New Bag/Given 09/18/17 1345)  vancomycin (VANCOCIN) 1,500 mg in sodium chloride 0.9 % 500 mL IVPB (has no administration in time range)  piperacillin-tazobactam (ZOSYN) IVPB 3.375 g (has no administration in time range)  heparin bolus via infusion 7,500 Units (has no administration in time range)  heparin ADULT infusion 100 units/mL (25000 units/230mL sodium chloride 0.45%) (has no administration in time range)  acetaminophen (TYLENOL) tablet 650 mg (650 mg Oral Given 09/18/17 0916)  piperacillin-tazobactam (ZOSYN) IVPB 3.375 g (0 g Intravenous Stopped 09/18/17 1128)  vancomycin (VANCOCIN) IVPB 1000 mg/200 mL premix (0 mg Intravenous Stopped 09/18/17 1219)  lactated ringers bolus 1,000 mL (0 mLs Intravenous Stopped 09/18/17 1219)  iopamidol (ISOVUE-370) 76 % injection (100 mLs  Contrast Given 09/18/17 1206)  diphenhydrAMINE (BENADRYL) injection 25 mg (25 mg Intravenous Given 09/18/17 1409)  prochlorperazine (COMPAZINE) injection 10 mg (10 mg Intravenous Given 09/18/17 1409)     Initial Impression / Assessment and Plan / ED Course  I have reviewed the triage vital signs and the nursing notes.  Pertinent labs & imaging results that were available during my care of the patient were reviewed by me and considered in my  medical decision making (see chart for details).    Pt was non-toxic on exam, T 101, remainder of VS normal. Mild drainage from sternotomy wound, no frank pus.  Initiated a code sepsis based on his recent surgical  procedures.  Ordered blood and urine cultures, vancomycin and Zosyn, Tylenol.  Initial lactate 2.3, UA w/ moderate leuks, some WBC, rare bacteria; neg troponin, WBC 16.7, Hgb 12.2, Cr normal.  Because of his chest pain and shortness of breath, obtained CTA of chest which was positive for PE, mild to moderate burden of bilateral PE without evidence of right heart strain.  Contacted cardiothoracic surgery, discussed with Thurmond Butts, nurse for Dr. Roxy Manns. He will see pt in hospital, requested medicine admission. Initiated heparin drip and discussed admission w/ internal medicine teaching service.  Patient admitted for further treatment. Final Clinical Impressions(s) / ED Diagnoses   Final diagnoses:  None    ED Discharge Orders    None       Lucciana Head, Wenda Overland, MD 09/18/17 1443

## 2017-09-18 NOTE — Progress Notes (Signed)
ANTICOAGULATION CONSULT NOTE - Initial Consult  Pharmacy Consult for heparin Indication: pulmonary embolus  No Known Allergies  Patient Measurements: Height: 5\' 7"  (170.2 cm) Weight: (!) 425 lb 4.3 oz (192.9 kg) IBW/kg (Calculated) : 66.1 Heparin Dosing Weight: 115 kg  Vital Signs: Temp: 101 F (38.3 C) (04/30 0911) Temp Source: Oral (04/30 0911) BP: 120/64 (04/30 1345) Pulse Rate: 68 (04/30 1345)  Labs: Recent Labs    09/18/17 0739 09/18/17 0913  HGB  --  12.2*  HCT  --  38.4*  PLT  --  326  LABPROT  --  16.0*  INR 1.4 1.29  CREATININE  --  1.08    Estimated Creatinine Clearance: 141.2 mL/min (by C-G formula based on SCr of 1.08 mg/dL).  Assessment: CC/HPI: 47 yo m presenting with CP since surgery 3 weeks ago of valve replacement ppm and aneurysm repaor  PMH: CHF, aortic stenosis s/p valve, Thoracic aortic aneurysm, COPD   Anticoag: warfarin pta for hx valve replacement last dose 4/29 - admit inr 1.29 New onset PE no right heart strain  Renal: SCr 1.08  Heme/Onc: H&H 12.2/38.4, Plt 326  Goal of Therapy:  Heparin level 0.3-0.7 units/ml Monitor platelets by anticoagulation protocol: Yes  Plan: Heparin bolus 7500 units x 1 Heparin infusion 2000 units/hr Initial level 2200 Daily hep lvl cbc F/u plans for oral ac  Levester Fresh, PharmD, BCPS, BCCCP Clinical Pharmacist Clinical phone for 09/18/2017 from 7a-3:30p: [phone] If after 3:30p, please call main pharmacy at: [phone] 09/18/2017 2:21 PM

## 2017-09-18 NOTE — ED Notes (Signed)
Carmine Savoy RN made aware of pt Lactic Acid. Results ED-LAb

## 2017-09-18 NOTE — Patient Instructions (Signed)
On Amiodarone 200mg  bid Take coumadin 2 tablets tonight then increase dose to 1 1/2 tablets daily Recheck in 1 week

## 2017-09-18 NOTE — Telephone Encounter (Signed)
Patient walk in Temp this morning was 101 when he took at 6am this morning

## 2017-09-18 NOTE — ED Triage Notes (Signed)
Pt presents for evaluation of chest pain and fevers. Had valve replacement and pacemaker placed 3 weeks ago.

## 2017-09-18 NOTE — H&P (Signed)
Date: 09/18/2017               Patient Name:  Reginald [name] MRN: [id number]  DOB: 1971/02/24 Age / Sex: 47 y.o., male   PCP: Reginald Blitz, MD         Medical Service: Internal Medicine Teaching Service         Attending Physician: Dr. Bartholomew Crews, MD    First Contact: Reginald [name] Pager: [phone]  Second Contact: Dr. Lorella [name] Pager: [phone]       After Hours (After 5p/  First Contact Pager: [phone]  weekends / holidays): Second Contact Pager: [phone]   Chief Complaint: Fever  History of Present Illness: Reginald [name] is a 47 year old male current 1/2 ppd smoker with a PMHx of diastolic CHF, HTN, COPD, chronic venous insufficiency, OSA, gout and morbid obesity who presents with fever and chills s/p aortic valve replacement, thoracic aneurysm repair and PPM placement 21 days prior.   He had progressive worsening dyspnea over the past 6 months which prompted the surgical repair of his bicuspid, severely stenotic aortic valve and dilated aorta a few weeks ago. 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 During that hospitalization, he began therapy at warfarin for which his INRs have not yet reached therapeutic levels, 1.3 on 09/04/17, and 1.4 at 7 AM (INR check 4/23 results not reported).  He had been recovering well but reported feeling feverish on his way to a follow up appointment this morning. Fever at that time was documented to be 102.2 and the note from clinic reports chills, aches and chest pain.  He reports an unchanged baseline cough productive of brown sputum despite [place] records reporting worsening cough beginning 4/19. He suffers from intermittent LE edema for which he wears compression socks. He states that he has shortness of breath with relatively minimal activity but asserts that he is still active and enjoys walking around the flea market on the weekends.  Medications are listed below. Of note, he was placed on amiodarone  200mg  after developing postsurgical heart block following his aortic valve/aneurym repair at which time he was recommended for pacemaker. Upcoming appointment to reduce amiodarone dose is 09/21/17.  A cardiac catheterization was performed 07/16/17 which showed no evidence of significant CAD. Perioperative echocardiogram revealed a LVEF of 55-60%.  In ED he was found febrile at 101, leukocytosis at 16.7 with neutrophil predominance, lactic acidosis at 2.33, meeting the criteria for sepsis.  CTA was positive for bilateral PE with no evidence of right heart strain.  Meds:  Current Meds  Medication Sig  . acetaminophen (TYLENOL) 325 MG tablet Take 650 mg by mouth every 6 (six) hours as needed for mild pain.  Marland Kitchen albuterol (PROVENTIL HFA;VENTOLIN HFA) 108 (90 Base) MCG/ACT inhaler Inhale 2 puffs into the lungs every 6 (six) hours as needed for wheezing or shortness of breath.   . allopurinol (ZYLOPRIM) 300 MG tablet Take 300 mg by mouth daily.  Marland Kitchen amiodarone (PACERONE) 200 MG tablet Take 1 tablet (200 mg total) by mouth 2 (two) times daily.  Marland Kitchen aspirin EC 325 MG EC tablet Take 1 tablet (325 mg total) by mouth daily.  . chlorhexidine (PERIDEX) 0.12 % solution Rinse with 15 mls twice daily for 30 seconds. Use after breakfast and at bedtime. Spit out excess. Do not swallow. (Patient taking differently: Use as directed 15 mLs in the mouth or throat 2 (two) times daily. Rinse with 15 mls  twice daily for 30 seconds. Use after breakfast and at bedtime. Spit out excess. Do not swallow.)  . citalopram (CELEXA) 20 MG tablet Take 20 mg by mouth at bedtime.  . clonazePAM (KLONOPIN) 1 MG tablet Take 0.5-1 mg by mouth every 8 (eight) hours as needed for anxiety.  . docusate sodium (COLACE) 100 MG capsule Take 200 mg by mouth daily.   . furosemide (LASIX) 40 MG tablet Take 40-80 mg by mouth daily.   Marland Kitchen ipratropium (ATROVENT) 0.02 % nebulizer solution Take 0.5 mg by nebulization every 6 (six) hours as needed for wheezing or  shortness of breath.  . lisinopril-hydrochlorothiazide (PRINZIDE,ZESTORETIC) 20-25 MG tablet Take 1 tablet by mouth daily.  . metoprolol succinate (TOPROL-XL) 100 MG 24 hr tablet Take 1 tablet (100 mg total) by mouth daily. Take with or immediately following a meal.  . potassium chloride (K-DUR,KLOR-CON) 10 MEQ tablet Take 10 mEq by mouth daily.  . Probiotic Product (PROBIOTIC PO) Take 1 capsule by mouth at bedtime.  Marland Kitchen warfarin (COUMADIN) 5 MG tablet Take 1 tablet (5 mg total) by mouth daily at 6 PM. As directed by the coumadin clinic (Patient taking differently: Take 5 mg by mouth daily at 6 PM. )     Allergies: Allergies as of 09/18/2017  . (No Known Allergies)   Past Medical History:  Diagnosis Date  . Anxiety   . Aortic stenosis   . Aortic stenosis   . Asthma   . Back pain   . Cellulitis of skin with lymphangitis   . CHF (congestive heart failure) ([place])   . Chronic diastolic congestive heart failure ([place])   . Chronic venous insufficiency   . Constipation   . COPD (chronic obstructive pulmonary disease) ([place])   . Dyspnea   . Essential hypertension   . Gout   . Hypertension   . Morbid obesity ([place])   . Obesity   . Pneumonia    walking pneumonia  . Prostatitis   . Pulmonary hypertension ([place])   . S/P aortic valve replacement with bioprosthetic valve 08/23/2017   25 mm Edwards Inspiris Resilia stented bovine pericardial tissue valve  . 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 Sleep apnea    cpap   . Thoracic ascending aortic aneurysm ([place])   . Thoracic ascending aortic aneurysm ([place])   . Tobacco abuse     Family History: Positive for AV replacement, heart disease, DM in brother. HTN and Alzh in mother.  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  Review of Systems: A complete ROS was negative except  as per HPI.  Physical Exam: Blood pressure 114/72, pulse 72, temperature (!) 101 F (38.3 C), temperature source Oral, resp. rate 18, height 5\' 7"  (1.702 m), weight (!) 425 lb 4.3 oz (192.9 kg), SpO2 100 %. Physical Exam  Constitutional: He is oriented to person, place, and time. He appears well-developed. He is cooperative.  Obese male, wincing in positional discomfort  HENT:  Head: Normocephalic and atraumatic.  Eyes: Conjunctivae and EOM are normal.  Neck: Neck supple. No JVD present. Carotid bruit is not present.  Exam limited by body habitus  Cardiovascular: Regular rhythm, S1 normal and S2 normal. Exam reveals no gallop and no friction rub.  Murmur heard.  Systolic murmur is present with a grade of 2/6. Pulmonary/Chest: Effort normal  and breath sounds normal. No accessory muscle usage.    No accessory sounds, exam limited by habitus  Abdominal: Soft. Normal appearance and bowel sounds are normal. There is no tenderness. There is no CVA tenderness.  Protuberant, obese  Neurological: He is alert and oriented to person, place, and time.  Psychiatric: He has a normal mood and affect. His speech is normal and behavior is normal. Judgment and thought content normal.    EKG: personally reviewed my interpretation is atrially paced, unchanged from prior 09/06/17.  CXR: personally reviewed my interpretation is: cardiomegaly, expected postsurgical changes without evidence of infiltrate. Negative for pneumothorax, +atelectatic right lung base.  CTA (performed 09/18/17):  IMPRESSION: 1. Mild-to-moderate amount of acute bilateral pulmonary emboli, greater on the left, without evidence of right heart strain. 2. Interval right lower lobe atelectasis with no findings suspicious for pneumonia. 3. Interval minimal bilateral pleural fluid and small pericardial effusion. 4. Status post aortic valve repair and ascending thoracic aortic aneurysm repair. 5. Mild mediastinal and upper abdominal  adenopathy with minimal progression. This is most likely reactive.  Assessment & Plan by Problem: Active Problems:   Chronic diastolic congestive heart failure (HCC)   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   Pulmonary embolism (HCC)   Fever and chills  #Pulmonary Emboli, inadequate anticoagulation: Mr. Bunda was found to have mild to moderate bilateral pulmonary emboli L>R without evidence of RH strain. This is potentially the source of his fever, chest pain, lactic acidosis, and chills, though infection cannot be ruled out at this time. He started taking warfarin approximately 3 weeks ago though INRs recorded never exceeded 2.0. - Begin heparin gtt  #Fever/Chills/Sepsis: Likely related to PE as described above though potentially the result of occult infection. Urinalysis on admission revealed moderate leukocytosis and rare bacteria. This combined with lactic acidosis and fevers to 102.39F meet criteria for sepsis. -Blood culture-result pending. -  IV Vancomycin, IV Zosyn given in ED  #Diastolic CHF: We will continue home lasix.  #S/p TAVR, aneurysm repair, PPM placement, atrial fibrillation: - Continue home amiodarone-- appointment to reduce dose 5/3 -Cardiothoracic surgery was consulted from ED-we will appreciate their recommendations.  Other chronic medical conditions: #OSA, COPD, gout, depression/anxiety: Patient bringing CPAP from home for overnight use. Albuterol & ipratropium prn.  Continue Celexa 20 mg and Klonipin 0.5 mg prn.  Diet: heart healthy PPX: Heparin  Dispo: Admit patient to inpatient with the expected length of stay more than 2 days.  Attestation for Student Documentation:  I personally was present and performed or re-performed the history, physical exam and medical decision-making activities of this service and have verified that the service and findings are accurately documented in the student's  note.  Reginald Nimrod, MD 09/18/2017, 8:20 PM  Signed: Laureen Ochs, Medical Student 09/18/2017, 4:54 PM  Pager: [phone]

## 2017-09-19 DIAGNOSIS — I4891 Unspecified atrial fibrillation: Secondary | ICD-10-CM

## 2017-09-19 LAB — BASIC METABOLIC PANEL
Anion gap: 10 (ref 5–15)
BUN: 16 mg/dL (ref 6–20)
CALCIUM: 8.4 mg/dL — AB (ref 8.9–10.3)
CHLORIDE: 102 mmol/L (ref 101–111)
CO2: 25 mmol/L (ref 22–32)
CREATININE: 0.96 mg/dL (ref 0.61–1.24)
Glucose, Bld: 103 mg/dL — ABNORMAL HIGH (ref 65–99)
Potassium: 3.4 mmol/L — ABNORMAL LOW (ref 3.5–5.1)
SODIUM: 137 mmol/L (ref 135–145)

## 2017-09-19 LAB — HEPARIN LEVEL (UNFRACTIONATED)
Heparin Unfractionated: 0.17 IU/mL — ABNORMAL LOW (ref 0.30–0.70)
Heparin Unfractionated: 0.25 IU/mL — ABNORMAL LOW (ref 0.30–0.70)

## 2017-09-19 LAB — CBC
HCT: 34.7 % — ABNORMAL LOW (ref 39.0–52.0)
Hemoglobin: 10.9 g/dL — ABNORMAL LOW (ref 13.0–17.0)
MCH: 25.3 pg — ABNORMAL LOW (ref 26.0–34.0)
MCHC: 31.4 g/dL (ref 30.0–36.0)
MCV: 80.7 fL (ref 78.0–100.0)
PLATELETS: 287 10*3/uL (ref 150–400)
RBC: 4.3 MIL/uL (ref 4.22–5.81)
RDW: 15.7 % — AB (ref 11.5–15.5)
WBC: 17.4 10*3/uL — ABNORMAL HIGH (ref 4.0–10.5)

## 2017-09-19 LAB — HIV ANTIBODY (ROUTINE TESTING W REFLEX): HIV Screen 4th Generation wRfx: NONREACTIVE

## 2017-09-19 LAB — PROCALCITONIN

## 2017-09-19 MED ORDER — HEPARIN BOLUS VIA INFUSION
5000.0000 [IU] | Freq: Once | INTRAVENOUS | Status: AC
  Administered 2017-09-19: 5000 [IU] via INTRAVENOUS
  Filled 2017-09-19: qty 5000

## 2017-09-19 NOTE — Progress Notes (Signed)
Patient resting well on home CPAP.  RT assistance not needed at this time. 

## 2017-09-19 NOTE — Progress Notes (Signed)
ANTICOAGULATION CONSULT NOTE - Follow Up Consult  Pharmacy Consult for heparin Indication: pulmonary embolus  Labs: Recent Labs    09/18/17 0739 09/18/17 0913 09/18/17 2204 09/19/17 0630  HGB  --  12.2*  --  10.9*  HCT  --  38.4*  --  34.7*  PLT  --  326  --  287  LABPROT  --  16.0*  --   --   INR 1.4 1.29  --   --   HEPARINUNFRC  --   --  <0.10* 0.17*  CREATININE  --  1.08  --  0.96    Assessment: 47yo male recently started on anticoagulation with Coumadin for post-op atrial fibrillation after his recent bioprosthetic aortic valve replacement. His INR has not been >2 since he was started on Coumadin.  He was admitted 4/30 with a new pulmonary embolism and is now on anticoagulation with heparin.  His heparin level has increased with rate adjustment but remains subtherapeutic. His hemoglobin and platelet count have decreased from yesterday. No bleeding complications noted.  Goal of Therapy:  Heparin level 0.3-0.7 units/ml   Plan:  Give heparin 5000 units IV bolus Increase heparin infusion to 2900 units/hr Check heparin level in 6 hours Daily heparin level and CBC Monitor for bleeding complications  Legrand Como, Pharm.D., BCPS, BCIDP Clinical Pharmacist Phone: [phone] 09/19/2017, 9:23 AM

## 2017-09-19 NOTE — Progress Notes (Signed)
#   1. S/ W JAMIE @ [facility] # [id number]    1. ELIQUIS 2.5 MG BID  COVER- YES  CO-PAY- ZERO DOLLARS  PRIOR APPROVAL- NO   2. ELIQUIS 5 MG BID  COVER- YES  CO-PAY- ZERO DOLLARS  PRIOR APPROVAL- NO   3. XARELTO 15 MG BID  COVER- YES  CO-PAY- ZERO DOLLARS  PRIOR APPROVAL-NO   4. XARELTO 20 MG DAILY  COVER- YES  CO-PAY- ZERO DOLLARS  PRIOR APPROVAL-NO    PREFERRED PHARMACY : YES- [facility]

## 2017-09-19 NOTE — Progress Notes (Signed)
[facility]  Patient Status: Active (receiving services up to time of hospitalization)  [facility] is providing the following services: RN  If patient discharges after hours, please call [phone].   Reginald [name] 09/19/2017, 4:14 PM

## 2017-09-19 NOTE — Progress Notes (Signed)
  Date: 09/19/2017  Patient name: [name]  Medical record number: [id number]  Date of birth: Sep 23, 1970   I have seen and evaluated Reginald [name] and discussed their care with the Residency Team. Reginald [name] is a 47 yo man with obesity, OSA, and an bioprosthetic aortic valve replacement, thoracic aneurysm repair, and a pacemaker placement in early April. He developed post op A Fib and required warfarin. Since his D/C on 08/30/17, his INR has never been therapeutic. Pt presented with fever and chills. A CT done in the ED showed B PE without RV strain.   PMHx, Fam Hx, and/or Soc Hx : Drives a truck for a living  Vitals:   09/19/17 0800 09/19/17 0953  BP: 140/81 140/81  Pulse: 68 72  Resp: 12   Temp: 98.6 F (37 C)   SpO2: 97%   T max 102.9  Obese, NAD, speaking in full sentences HRRR systolic murmur  ABD + BS, soft Ext + edema B Sternal incision - serous drainage from inf edge of incision, no erythema  HgB 10.9 WBC 17 LA max 2.33, now 1.2 Blood cx pending  I personally viewed the CXR images and confirmed my reading with the official read. PA and lateral, sternal wires, pacer and leads, linear ATX R base  I personally viewed the EKG and confirmed my reading with the official read. Sinus, RBBB  Assessment and Plan: I have seen and evaluated the patient as outlined above. I agree with the formulated Assessment and Plan as detailed in the residents' note, with the following changes: Reginald [name] is a 47 yo man with recent hospitalization for a bioprosthetic aortic valve replacement, thoracic aneurysm repair, and a pacemaker placement. He had post op A Fib and has had a subtherapeutic INR level as an outpt. He presented with B provoked PE (first personal VTE) and a fever, likely 2/2 sternal wound infxn.   1. Provoked PE - He is hemodynamically stable. CVTS has elected to cont heparin drip over lovenox. Hep levels are subtherapeutic and pharmacy is managing the hepatin. Due to his A Fib in  setting of a bioprosthetic valve, warfarin is the outpt anticoagulant of choice due to more evidence over DOAC. However, due to lack of achieiving therapeutic INR as an outpt, CVTS is considering a DOAC at D/C. Will need at least 3 months of therapy and then reassessment but will need AC for his A Fib.  2. Sepsis - the likely etiology is 2/2 infected sternal infxn. CVTS is eval the wound. For now, we will cont the vanc and zosyn. If it is the wound infxn, GPC, inc MRSA, are the likely organisms. His urine cx is + > 100,000 serratia. He was recently hospitalized and had a foley catheter. He is asymptomatic. Narrow zosyn & Vanc as soon as possible.  3. A Fib in presence of bioprosthetic valve - warfarin unless CVTS chooses a DOAC.  Bartholomew Crews, MD 5/1/20192:34 PM

## 2017-09-19 NOTE — Progress Notes (Addendum)
[address]       [place] [address]             [phone]         Subjective:  Not SOB  Objective: Vital signs in last 24 hours: Temp:  [99.3 F (37.4 C)-102.9 F (39.4 C)] 99.3 F (37.4 C) (05/01 0058) Pulse Rate:  [68-160] 69 (05/01 0058) Cardiac Rhythm: Normal sinus rhythm;Heart block (05/01 0745) Resp:  [14-29] 18 (05/01 0058) BP: (109-144)/(53-78) 111/63 (05/01 0058) SpO2:  [94 %-100 %] 94 % (05/01 0058) FiO2 (%):  [98 %] 98 % (04/30 2010) Weight:  [191.3 kg (421 lb 11.8 oz)-192.9 kg (425 lb 4.3 oz)] 191.3 kg (421 lb 11.8 oz) (05/01 0500)  Hemodynamic parameters for last 24 hours:    Intake/Output from previous day: 04/30 0701 - 05/01 0700 In: 0109 [P.O.:240; I.V.:43; IV Piggyback:1350] Out: 1300 [Urine:1300] Intake/Output this shift: No intake/output data recorded.  General appearance: alert, cooperative and no distress Heart: regular rate and rhythm and + systolic murmur Lungs: mildly dim in lower fields Abdomen: obese, nontender Extremities: chronic swelling, no cald tenderness or pitting edema Wound: some slightly purulent drainage from lower pole of sternal incis, no erethema, no sternal instability with cough  Lab Results: Recent Labs    09/18/17 0913 09/19/17 0630  WBC 16.7* 17.4*  HGB 12.2* 10.9*  HCT 38.4* 34.7*  PLT 326 287   BMET:  Recent Labs    09/18/17 0913 09/19/17 0630  NA 135 137  K 3.7 3.4*  CL 100* 102  CO2 24 25  GLUCOSE 151* 103*  BUN 20 16  CREATININE 1.08 0.96  CALCIUM 9.1 8.4*    PT/INR:  Recent Labs    09/18/17 0913  LABPROT 16.0*  INR 1.29   ABG    Component Value Date/Time   PHART 7.436 08/24/2017 0332   HCO3 27.0 08/24/2017 0332   TCO2 26 08/24/2017 1828   ACIDBASEDEF 2.0 08/23/2017 1858   O2SAT 93.0 08/24/2017 0332   CBG (last 3)  No results for input(s): GLUCAP in the last 72 hours.  Meds Scheduled Meds: . amiodarone  200 mg Oral BID  . aspirin  325 mg Oral Daily  .  citalopram  20 mg Oral QHS  . furosemide  40 mg Oral Daily  . metoprolol succinate  100 mg Oral Daily  . sodium chloride flush  3 mL Intravenous Q12H   Continuous Infusions: . heparin 2,500 Units/hr (09/19/17 0827)  . piperacillin-tazobactam (ZOSYN)  IV Stopped (09/19/17 0549)  . vancomycin Stopped (09/19/17 0349)   PRN Meds:.acetaminophen **OR** acetaminophen, clonazePAM, ibuprofen, polyethylene glycol  Xrays Dg Chest 2 View  Result Date: 09/18/2017 CLINICAL DATA:  Chest pain with shortness of breath and fever EXAM: CHEST - 2 VIEW COMPARISON:  August 29, 2017 FINDINGS: There is atelectatic change in the posterior right base. Lungs elsewhere are clear. Heart is borderline enlarged with pulmonary vascularity within normal limits. Pacemaker leads are attached to the right atrium and right ventricle. The patient has had a previous aortic valve replacement. No evident adenopathy. No bone lesions. IMPRESSION: Atelectatic change posterior right base. Lungs elsewhere clear. Status post aortic valve replacement. Pacemaker leads attached to right atrium and right ventricle. Heart borderline enlarged. Electronically Signed   By: Lowella Grip III M.D.   On: 09/18/2017 09:53   Ct Angio Chest Pe W/cm &/or Wo Cm  Result Date: 09/18/2017 CLINICAL DATA:  Chest pain and fever. Pacemaker placed 3  weeks ago. Status post aortic valve replacement and ascending thoracic aortic repair. EXAM: CT ANGIOGRAPHY CHEST WITH CONTRAST TECHNIQUE: Multidetector CT imaging of the chest was performed using the standard protocol during bolus administration of intravenous contrast. Multiplanar CT image reconstructions and MIPs were obtained to evaluate the vascular anatomy. CONTRAST:  179mL ISOVUE-370 IOPAMIDOL (ISOVUE-370) INJECTION 76% COMPARISON:  Chest radiographs obtained earlier today and chest, abdomen and pelvis CTA dated 07/26/2017. FINDINGS: Cardiovascular: Suboptimally opacified pulmonary arteries with a moderate-sized  elongated filling defect within the left lower lobe pulmonary artery and extending into its branches. Probable small bilateral right lower lobe pulmonary artery branch filling defects. No evidence of right heart strain. Enlarged heart. Bipolar left subclavian pacemaker with satisfactory position of the leads. Interval small pericardial effusion with a maximum thickness of 13 mm. Again noted is the left common carotid artery arising from the innominate artery. A prosthetic aortic valve is in place. The previously demonstrated enlargement of the ascending thoracic aorta is no longer demonstrated. The ascending thoracic aorta currently has a maximum diameter of 3.2 cm at the level of the main pulmonary artery. Mediastinum/Nodes: Mildly enlarged mediastinal nodes including a right lower paratracheal node with a short axis diameter of 13 mm on image number 44 series 5, previously 11 mm. A subcarinal node has a short axis diameter of 11 mm on image number 57 of series 5, previously 11 mm. Unremarkable esophagus and included portion of the thyroid gland. Lungs/Pleura: Interval right lower lobe atelectasis. Interval minimal bilateral pleural fluid. Upper Abdomen: Mildly enlarged celiac axis and porta hepatis lymph nodes. These include a celiac axis node with a short axis diameter of 15 mm on image number 150 of series 5, previously 14 mm. A porta hepatis node has a short axis diameter of 14 mm on image number 150 of series 5, previously 12 mm. Musculoskeletal: Thoracic spine degenerative changes. Review of the MIP images confirms the above findings. IMPRESSION: 1. Mild-to-moderate amount of acute bilateral pulmonary emboli, greater on the left, without evidence of right heart strain. 2. Interval right lower lobe atelectasis with no findings suspicious for pneumonia. 3. Interval minimal bilateral pleural fluid and small pericardial effusion. 4. Status post aortic valve repair and ascending thoracic aortic aneurysm repair. 5.  Mild mediastinal and upper abdominal adenopathy with minimal progression. This is most likely reactive. 6. Critical Value/emergent results were called by telephone at the time of interpretation on 09/18/2017 at 12:53 pm to Dr. Theotis Burrow , who verbally acknowledged these results. Electronically Signed   By: Claudie Revering M.D.   On: 09/18/2017 12:55    Assessment/Plan:  1 PE management per primary, never therapeudicon coumadin, Eliquis may be best long term option.  2 will discuss sternal wound with MD, cont current abx- will culture  LOS: 1 day    [place] 09/19/2017  I have seen and examined the patient and agree with the assessment and plan as outlined.  I am not impressed by the small amount of drainage from the patient's sternal wound.  There is no significant cellulitis nor other local signs of infection and the superficial and deep tissues look fine on CT scan.  However, we will need to monitor the wound closely and I agree w/ empiric antibiotics for now.  I agree that it might make sense to switch to DOAC rather than warfarin for anticoagulation due to PE and recent h/o Afib.  He does not need anticoagulation using warfarin because of his bioprosthetic heart valve.  Braulio Conte  Keturah Barre, MD 09/19/2017 2:31 PM

## 2017-09-19 NOTE — Progress Notes (Signed)
Subjective: Mr. Linker was resting comfortably with his CPAP on when we entered the room. Though he had fever overnight, did not feel feverish during the AM. Denies chest pain or increased shortness of breath from baseline. He continues to express frustration with his anticoagulation regimen (being subtherapeutic on warfarin, perceived inability to consume leafy greens) and with the general nature of his post-operative course, which is understandable.  Of note, he stated that the drainage from his sternotomy incision had dripped all the way down to his stomach overnight.  Objective:  Vital signs in last 24 hours: Vitals:   09/19/17 0058 09/19/17 0500 09/19/17 0800 09/19/17 0953  BP: 111/63  140/81 140/81  Pulse: 69  68 72  Resp: 18  12   Temp: 99.3 F (37.4 C)  98.6 F (37 C)   TempSrc: Axillary  Oral   SpO2: 94%  97%   Weight:  (!) 421 lb 11.8 oz (191.3 kg)    Height:       CBC Latest Ref Rng & Units 09/19/2017 09/18/2017 08/31/2017  WBC 4.0 - 10.5 K/uL 17.4(H) 16.7(H) 12.0(H)  Hemoglobin 13.0 - 17.0 g/dL 10.9(L) 12.2(L) 11.3(L)  Hematocrit 39.0 - 52.0 % 34.7(L) 38.4(L) 35.4(L)  Platelets 150 - 400 K/uL 287 326 264   Lab Results  Component Value Date   NA 137 09/19/2017   K 3.4 (L) 09/19/2017   CL 102 09/19/2017   CO2 25 09/19/2017     Physical Exam  Constitutional: He is oriented to person, place, and time and well-developed, well-nourished, and in no distress.  Cardiovascular: Normal rate.  Murmur heard. Pulmonary/Chest: Effort normal and breath sounds normal.  Exam limited by habitus  Abdominal: Soft. Bowel sounds are normal. There is no tenderness. There is no rebound.  Musculoskeletal: He exhibits edema.       Right ankle: He exhibits swelling.       Left ankle: He exhibits swelling.  Neurological: He is alert and oriented to person, place, and time.  Skin: Skin is warm and dry. He is not diaphoretic. No cyanosis. Nails show no clubbing.     Psychiatric: Mood,  affect and judgment normal.      Assessment/Plan:  Principal Problem:   Pulmonary embolism (HCC) Active Problems:   Morbid obesity ([place])   Essential hypertension   Chronic diastolic congestive heart failure ([place])   Pulmonary hypertension (HCC)   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   Fever  #Pulmonary Emboli, inadequate anticoagulation: Mr. Kelly was found to have mild to moderate bilateral pulmonary emboli L>R without evidence of RH strain. This is potentially the source of his fever, chest pain, lactic acidosis, and chills, though infection cannot be ruled out at this time. He started taking warfarin approximately 3 weeks ago though INRs recorded never exceeded 2.0. Heparin gtt was started in the ED. - Heparin level 0.10 yesterday, 0.17 today (subtherapeutic) - Continue heparin gtt; consider LMWH - Long term may be candidate for DOAC (Xarelto post-PE, then switch to Eliquis for A. Fib?); appreciate cardiothoracic recommendations-  #Fever/Chills/Sepsis: Potentially the result of occult infection. Urinalysis on admission revealed moderate leukocytosis and rare bacteria. This combined with lactic acidosis and fevers to 102.71F met criteria for sepsis. Lactic acidosis resolved 09/19/2017, repeat fever last night. Negative procalcitonin and negative CXR have excluded pneumonia as a likely cause. Today, bottom of sternotomy incision appears to be draining serous fluid significantly more than yesterday. Clean catch urine culture returned positive for  Serratia marcescens (generally susceptible to piperacillin-tazobactam). - Sternotomy superficial wound culture pending - Blood cultures pending - Continue IV Vancomycin, IV Zosyn until sensitivities result  #Diastolic CHF: We will continue home Lasix 40 qd. Increased lower extremity edema 09/19/17, which is not surprising given increased IVF. Continue to monitor.  #S/p TAVR, aneurysm  repair, PPM placement, atrial fibrillation: - Continue home amiodarone-- dose reduction sch outpt 5/3 - Cardiothoracic surgery consulted; appreciate their recommendations.  Other chronic medical conditions: #OSA, COPD, gout, depression/anxiety: Patient bringing CPAP from home for overnight use. Albuterol & ipratropium prn.  Continue Celexa 20 mg and Klonipin 0.5 mg prn.  Diet: heart healthy PPX: Heparin   Dispo: Anticipated discharge in approximately 2 day(s).   Laureen Ochs, Medical Student 09/19/2017, 11:31 AM Pager: [phone]  Attestation for Student Documentation:  I personally was present and performed or re-performed the history, physical exam and medical decision-making activities of this service and have verified that the service and findings are accurately documented in the student's note.  Lorella Nimrod, MD 09/19/2017, 1:11 PM

## 2017-09-20 ENCOUNTER — Other Ambulatory Visit: Payer: Self-pay | Admitting: Surgical

## 2017-09-20 DIAGNOSIS — G8929 Other chronic pain: Secondary | ICD-10-CM

## 2017-09-20 DIAGNOSIS — M19011 Primary osteoarthritis, right shoulder: Secondary | ICD-10-CM

## 2017-09-20 DIAGNOSIS — D649 Anemia, unspecified: Secondary | ICD-10-CM

## 2017-09-20 DIAGNOSIS — B9689 Other specified bacterial agents as the cause of diseases classified elsewhere: Secondary | ICD-10-CM

## 2017-09-20 DIAGNOSIS — M7989 Other specified soft tissue disorders: Secondary | ICD-10-CM

## 2017-09-20 DIAGNOSIS — I110 Hypertensive heart disease with heart failure: Secondary | ICD-10-CM

## 2017-09-20 DIAGNOSIS — N390 Urinary tract infection, site not specified: Secondary | ICD-10-CM

## 2017-09-20 DIAGNOSIS — M19012 Primary osteoarthritis, left shoulder: Secondary | ICD-10-CM

## 2017-09-20 LAB — CBC
HEMATOCRIT: 32.8 % — AB (ref 39.0–52.0)
HEMOGLOBIN: 10.3 g/dL — AB (ref 13.0–17.0)
MCH: 25.5 pg — AB (ref 26.0–34.0)
MCHC: 31.4 g/dL (ref 30.0–36.0)
MCV: 81.2 fL (ref 78.0–100.0)
Platelets: 286 10*3/uL (ref 150–400)
RBC: 4.04 MIL/uL — ABNORMAL LOW (ref 4.22–5.81)
RDW: 15.5 % (ref 11.5–15.5)
WBC: 16.6 10*3/uL — ABNORMAL HIGH (ref 4.0–10.5)

## 2017-09-20 LAB — IRON AND TIBC
IRON: 15 ug/dL — AB (ref 45–182)
Saturation Ratios: 5 % — ABNORMAL LOW (ref 17.9–39.5)
TIBC: 333 ug/dL (ref 250–450)
UIBC: 318 ug/dL

## 2017-09-20 LAB — HEPARIN LEVEL (UNFRACTIONATED)
Heparin Unfractionated: 0.17 IU/mL — ABNORMAL LOW (ref 0.30–0.70)
Heparin Unfractionated: 0.47 IU/mL (ref 0.30–0.70)

## 2017-09-20 LAB — FERRITIN: FERRITIN: 211 ng/mL (ref 24–336)

## 2017-09-20 LAB — GLUCOSE, CAPILLARY: Glucose-Capillary: 95 mg/dL (ref 65–99)

## 2017-09-20 LAB — URINE CULTURE: SPECIAL REQUESTS: NORMAL

## 2017-09-20 LAB — PROCALCITONIN: Procalcitonin: <0.1 ng/mL

## 2017-09-20 MED ORDER — HEPARIN BOLUS VIA INFUSION
5000.0000 [IU] | Freq: Once | INTRAVENOUS | Status: AC
Start: 2017-09-20 — End: 2017-09-20
  Administered 2017-09-20: 5000 [IU] via INTRAVENOUS
  Filled 2017-09-20: qty 5000

## 2017-09-20 MED ORDER — SULFAMETHOXAZOLE-TRIMETHOPRIM 800-160 MG PO TABS
1.0000 | ORAL_TABLET | Freq: Two times a day (BID) | ORAL | Status: DC
  Administered 2017-09-20: 1 via ORAL
  Filled 2017-09-20: qty 1

## 2017-09-20 MED ORDER — RIVAROXABAN 15 MG PO TABS
15.0000 mg | ORAL_TABLET | Freq: Two times a day (BID) | ORAL | Status: DC
  Administered 2017-09-20: 15 mg via ORAL
  Filled 2017-09-20: qty 1

## 2017-09-20 MED ORDER — RIVAROXABAN 15 MG PO TABS: 15.0000 mg | ORAL_TABLET | Freq: Two times a day (BID) | ORAL | Status: DC

## 2017-09-20 MED ORDER — RIVAROXABAN 15 MG PO TABS: 15.0000 mg | ORAL_TABLET | Freq: Two times a day (BID) | ORAL | 0 refills | Status: DC

## 2017-09-20 MED ORDER — SULFAMETHOXAZOLE-TRIMETHOPRIM 800-160 MG PO TABS: 1.0000 | ORAL_TABLET | Freq: Two times a day (BID) | ORAL | 0 refills | Status: AC

## 2017-09-20 MED ORDER — HEPARIN (PORCINE) IN NACL 100-0.45 UNIT/ML-% IJ SOLN: 3600.0000 [IU]/h | INTRAMUSCULAR | Status: DC

## 2017-09-20 NOTE — Progress Notes (Signed)
[address]       [place] [address]             [phone]         Subjective: Feeling well  Objective: Vital signs in last 24 hours: Temp:  [97.8 F (36.6 C)-99.8 F (37.7 C)] 97.8 F (36.6 C) (05/02 0805) Pulse Rate:  [61-72] 61 (05/02 0805) Cardiac Rhythm: Ventricular paced;Bundle branch block (05/02 0700) Resp:  [18-24] 22 (05/02 0805) BP: (108-140)/(30-84) 134/84 (05/02 0805) SpO2:  [99 %-100 %] 100 % (05/02 0805) Weight:  [191.2 kg (421 lb 8.3 oz)] 191.2 kg (421 lb 8.3 oz) (05/02 0237)  Hemodynamic parameters for last 24 hours:    Intake/Output from previous day: 05/01 0701 - 05/02 0700 In: 4733.9 [P.O.:1560; I.V.:973.9; IV Piggyback:2200] Out: 1450 [Urine:1450] Intake/Output this shift: No intake/output data recorded.  General appearance: alert, cooperative and no distress Heart: regular rate and rhythm and + systolic murmur Lungs: clear to auscultation bilaterally Wound: conts with sternal drainage, exam is unchanged  Lab Results: Recent Labs    09/19/17 0630 09/20/17 0206  WBC 17.4* 16.6*  HGB 10.9* 10.3*  HCT 34.7* 32.8*  PLT 287 286   BMET:  Recent Labs    09/18/17 0913 09/19/17 0630  NA 135 137  K 3.7 3.4*  CL 100* 102  CO2 24 25  GLUCOSE 151* 103*  BUN 20 16  CREATININE 1.08 0.96  CALCIUM 9.1 8.4*    PT/INR:  Recent Labs    09/18/17 0913  LABPROT 16.0*  INR 1.29   ABG    Component Value Date/Time   PHART 7.436 08/24/2017 0332   HCO3 27.0 08/24/2017 0332   TCO2 26 08/24/2017 1828   ACIDBASEDEF 2.0 08/23/2017 1858   O2SAT 93.0 08/24/2017 0332   CBG (last 3)  Recent Labs    09/20/17 0804  GLUCAP 95    Meds Scheduled Meds: . amiodarone  200 mg Oral BID  . aspirin  325 mg Oral Daily  . citalopram  20 mg Oral QHS  . furosemide  40 mg Oral Daily  . metoprolol succinate  100 mg Oral Daily  . sodium chloride flush  3 mL Intravenous Q12H   Continuous Infusions: . heparin 3,600 Units/hr  (09/20/17 0534)  . piperacillin-tazobactam (ZOSYN)  IV 3.375 g (09/20/17 0237)  . vancomycin Stopped (09/20/17 0449)   PRN Meds:.acetaminophen **OR** acetaminophen, clonazePAM, ibuprofen, polyethylene glycol  Xrays Dg Chest 2 View  Result Date: 09/18/2017 CLINICAL DATA:  Chest pain with shortness of breath and fever EXAM: CHEST - 2 VIEW COMPARISON:  August 29, 2017 FINDINGS: There is atelectatic change in the posterior right base. Lungs elsewhere are clear. Heart is borderline enlarged with pulmonary vascularity within normal limits. Pacemaker leads are attached to the right atrium and right ventricle. The patient has had a previous aortic valve replacement. No evident adenopathy. No bone lesions. IMPRESSION: Atelectatic change posterior right base. Lungs elsewhere clear. Status post aortic valve replacement. Pacemaker leads attached to right atrium and right ventricle. Heart borderline enlarged. Electronically Signed   By: Lowella Grip III M.D.   On: 09/18/2017 09:53   Ct Angio Chest Pe W/cm &/or Wo Cm  Result Date: 09/18/2017 CLINICAL DATA:  Chest pain and fever. Pacemaker placed 3 weeks ago. Status post aortic valve replacement and ascending thoracic aortic repair. EXAM: CT ANGIOGRAPHY CHEST WITH CONTRAST TECHNIQUE: Multidetector CT imaging of the chest was performed using the standard protocol during bolus administration of  intravenous contrast. Multiplanar CT image reconstructions and MIPs were obtained to evaluate the vascular anatomy. CONTRAST:  168mL ISOVUE-370 IOPAMIDOL (ISOVUE-370) INJECTION 76% COMPARISON:  Chest radiographs obtained earlier today and chest, abdomen and pelvis CTA dated 07/26/2017. FINDINGS: Cardiovascular: Suboptimally opacified pulmonary arteries with a moderate-sized elongated filling defect within the left lower lobe pulmonary artery and extending into its branches. Probable small bilateral right lower lobe pulmonary artery branch filling defects. No evidence of right  heart strain. Enlarged heart. Bipolar left subclavian pacemaker with satisfactory position of the leads. Interval small pericardial effusion with a maximum thickness of 13 mm. Again noted is the left common carotid artery arising from the innominate artery. A prosthetic aortic valve is in place. The previously demonstrated enlargement of the ascending thoracic aorta is no longer demonstrated. The ascending thoracic aorta currently has a maximum diameter of 3.2 cm at the level of the main pulmonary artery. Mediastinum/Nodes: Mildly enlarged mediastinal nodes including a right lower paratracheal node with a short axis diameter of 13 mm on image number 44 series 5, previously 11 mm. A subcarinal node has a short axis diameter of 11 mm on image number 57 of series 5, previously 11 mm. Unremarkable esophagus and included portion of the thyroid gland. Lungs/Pleura: Interval right lower lobe atelectasis. Interval minimal bilateral pleural fluid. Upper Abdomen: Mildly enlarged celiac axis and porta hepatis lymph nodes. These include a celiac axis node with a short axis diameter of 15 mm on image number 150 of series 5, previously 14 mm. A porta hepatis node has a short axis diameter of 14 mm on image number 150 of series 5, previously 12 mm. Musculoskeletal: Thoracic spine degenerative changes. Review of the MIP images confirms the above findings. IMPRESSION: 1. Mild-to-moderate amount of acute bilateral pulmonary emboli, greater on the left, without evidence of right heart strain. 2. Interval right lower lobe atelectasis with no findings suspicious for pneumonia. 3. Interval minimal bilateral pleural fluid and small pericardial effusion. 4. Status post aortic valve repair and ascending thoracic aortic aneurysm repair. 5. Mild mediastinal and upper abdominal adenopathy with minimal progression. This is most likely reactive. 6. Critical Value/emergent results were called by telephone at the time of interpretation on  09/18/2017 at 12:53 pm to Dr. Theotis Burrow , who verbally acknowledged these results. Electronically Signed   By: Claudie Revering M.D.   On: 09/18/2017 12:55   Results for orders placed or performed during the hospital encounter of 09/18/17  Culture, blood (Routine x 2)     Status: None (Preliminary result)   Collection Time: 09/18/17  9:17 AM  Result Value Ref Range Status   Specimen Description BLOOD LEFT HAND  Final   Special Requests   Final    BOTTLES DRAWN AEROBIC AND ANAEROBIC Blood Culture adequate volume   Culture   Final    NO GROWTH 1 DAY Performed at [facility], [place] [address] [place], [place] [address]    Report Status PENDING  Incomplete  Culture, blood (Routine x 2)     Status: None (Preliminary result)   Collection Time: 09/18/17 10:36 AM  Result Value Ref Range Status   Specimen Description BLOOD LEFT FOREARM  Final   Special Requests   Final    BOTTLES DRAWN AEROBIC AND ANAEROBIC Blood Culture results may not be optimal due to an excessive volume of blood received in culture bottles   Culture   Final    NO GROWTH 1 DAY Performed at [facility], [address] [place], [place] [address]    Report Status PENDING  Incomplete  Urine culture     Status: Abnormal   Collection Time: 09/18/17 10:44 AM  Result Value Ref Range Status   Specimen Description URINE, CLEAN CATCH  Final   Special Requests   Final    Normal Performed at [facility], [place] [address], [place], [place] [address]    Culture >=100,000 COLONIES/mL SERRATIA MARCESCENS (A)  Final   Report Status 09/20/2017 FINAL  Final   Organism ID, Bacteria SERRATIA MARCESCENS (A)  Final      Susceptibility   Serratia marcescens - MIC*    CEFAZOLIN >=64 RESISTANT Resistant     CEFTRIAXONE <=1 SENSITIVE Sensitive     CIPROFLOXACIN <=0.25 SENSITIVE Sensitive     GENTAMICIN <=1 SENSITIVE Sensitive     NITROFURANTOIN 256 RESISTANT Resistant     TRIMETH/SULFA <=20 SENSITIVE Sensitive     *  >=100,000 COLONIES/mL SERRATIA MARCESCENS  Aerobic Culture (superficial specimen)     Status: None (Preliminary result)   Collection Time: 09/19/17 10:19 AM  Result Value Ref Range Status   Specimen Description WOUND STERNUM  Final   Special Requests NONE  Final   Gram Stain   Final    NO WBC SEEN NO ORGANISMS SEEN Performed at [facility], [address] [place], [place] [address]    Culture PENDING  Incomplete   Report Status PENDING  Incomplete   Assessment/Plan:  1 doing well 2 PE management per primary 3  Defervescing, leukocytosis improving, + UTI, serratia- defer abx management to primary svc. Sternal drainage - monitor- most likely fatty necrosis  LOS: 2 days    Reginald [name] 09/20/2017

## 2017-09-20 NOTE — Progress Notes (Signed)
ANTICOAGULATION CONSULT NOTE - Follow Up Consult  Pharmacy Consult for heparin Indication: pulmonary embolus  Labs: Recent Labs    09/18/17 0739 09/18/17 0913  09/19/17 0630 09/19/17 1533 09/20/17 0007  HGB  --  12.2*  --  10.9*  --   --   HCT  --  38.4*  --  34.7*  --   --   PLT  --  326  --  287  --   --   LABPROT  --  16.0*  --   --   --   --   INR 1.4 1.29  --   --   --   --   HEPARINUNFRC  --   --    < > 0.17* 0.25* 0.17*  CREATININE  --  1.08  --  0.96  --   --    < > = values in this interval not displayed.    Assessment: 47yo male recently started on anticoagulation with Coumadin for post-op atrial fibrillation after his recent bioprosthetic aortic valve replacement. His INR has not been >2 since he was started on Coumadin.  He was admitted 4/30 with a new pulmonary embolism and is now on anticoagulation with heparin.   His heparin level has decreased despite rate increase.  No issues with infusion per RN Goal of Therapy:  Heparin level 0.3-0.7 units/ml   Plan:  Give heparin 5000 units IV bolus Increase heparin infusion to 3600 units/hr Check heparin level in 6 hours Daily heparin level and CBC Monitor for bleeding complications  Thanks for allowing pharmacy to be a part of this patient's care.  Excell Seltzer, PharmD Clinical Pharmacist 09/20/2017, 12:50 AM

## 2017-09-20 NOTE — Progress Notes (Signed)
Subjective: Reginald [name] is doing quite well this morning and was sitting up on the edge of the bed during our exam. He was afebrile overnight. He continues to have drainage from his lower sternotomy wound but denies any pain to the area. He took motrin once overnight for shoulder pain and was counseled to avoid NSAIDs in the future due to increased risk for GIB while on AC.  He feels that his lower extremity edema has increased due to the fluids given here in the hospital and states that it is not out of the ordinary for him to require a doubling of his lasix dose to improve his fluid status. He is very much ready to go home to see his dogs and attend the flea market, as well as his doctor's appointments tomorrow.  Objective:  Vital signs in last 24 hours: Vitals:   09/19/17 1621 09/19/17 2306 09/20/17 0237 09/20/17 0805  BP:  (!) 108/30  134/84  Pulse:  64  61  Resp:  (!) 24  (!) 22  Temp: 99 F (37.2 C) 99.4 F (37.4 C)  97.8 F (36.6 C)  TempSrc: Oral Oral    SpO2:  99%  100%  Weight:   (!) 421 lb 8.3 oz (191.2 kg)   Height:       Lab Results  Component Value Date   WBC 16.6 (H) 09/20/2017   HGB 10.3 (L) 09/20/2017   HCT 32.8 (L) 09/20/2017   MCV 81.2 09/20/2017   PLT 286 09/20/2017   Physical Exam  Constitutional: He is oriented to person, place, and time. He appears well-developed and well-nourished.  HENT:  Head: Normocephalic and atraumatic.  Cardiovascular: Normal rate.  Murmur heard.  Systolic murmur is present with a grade of 2/6. Healing sternotomy and pacemaker incisions. Bottom of sternotomy incision draining serous fluid.  Abdominal: Soft. Bowel sounds are normal.  Obese  Musculoskeletal:       Right lower leg: He exhibits edema. He exhibits no tenderness.       Left lower leg: He exhibits edema. He exhibits no tenderness.  Neurological: He is alert and oriented to person, place, and time.  Skin: Skin is warm and dry.  Psychiatric: He has a normal mood and  affect. His behavior is normal.     Assessment/Plan:  Principal Problem:   Pulmonary embolism (HCC) Active Problems:   Morbid obesity ([place])   Essential hypertension   Chronic diastolic congestive heart failure ([place])   Pulmonary hypertension (HCC)   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   Fever  #Pulmonary Emboli, inadequate anticoagulation: Reginald [name] was found to havemild to moderate bilateral pulmonary emboli L>R without evidence of RH strain. This is potentially the source of his fever, chest pain, lactic acidosis, and chills, though infection cannot be ruled out. He started taking warfarin approximately 3 weeks ago though INRs recorded never exceeded 2.0. Heparin gtt was started in the ED and continued until 09/20/17. - Heparin level peaked at 0.25, 0.17 today (subtherapeutic) -Discontinue heparin gtt at discharge, start rivaroxaban 74m BID x 21d, then 280mqd for at least 3 months (longer if needed for atrial fibrillation)  #Fever/Chills/Sepsis: Potentially the result of occult infection. Urinalysis on admission revealed moderate leukocytosis and rare bacteria. This combined with lactic acidosis and fevers to 102.25F met criteria for sepsis. Lactic acidosis resolved 09/19/2017, fevers resolved. Negative procalcitonin and negative CXR have excluded pneumonia as a likely cause. Bottom of sternotomy incision continues  to drain serous fluid significantly more than yesterday. Clean catch urine culture returned positive for Serratia marcescens, patient remains asymptomatic. - Sternotomy superficial wound culture NGTD, gram stain negative - Blood cultures NGTD - Discontinue IV Vancomycin, Zosyn; Narrow to po Bactrim  #Diastolic CHF:We will continue homeLasix 40 qd. Patient takes 40-64m at home depending on clinical volume status. Continue to monitor. Discontinue ASA due to anticoagulation with DOAC.  #S/p TAVR, aneurysm repair,  PPM placement, atrial fibrillation: - Continue home amiodarone-- dose reduction sch outpt 5/3  #Mild asymptomatic anemia: Found incidentally on hospital CBC. Hgb on admission was 12.2 up from 11.3 2-3 weeks ago. This decreased to 10.9 on hospital day 1, then again to 10.3 on hospital day 2. Reginald [name] fatigue out of the ordinary. An iron panel was ordered before discharge so that causes may be evaluated during outpatient follow-up.  Other chronic medical conditions: #OSA, COPD, gout, depression/anxiety:Patient bringing CPAP from home for overnight use. Albuterol &ipratropium prn. Continue Celexa 20 mg and Klonipin 0.5 mg prn.  #Arthritis: Reginald [name] from bilateral shoulder pain and diffuse joint pain likely a result of OA. He prefers to take ibuprofen but has been counseled that he may not continue this while taking heparin/warfarin/DOAC due to increased GI bleeding risk. He does not feel that tylenol adequately controls his pain without taking 4-5 6570mtablets at one time. We have encouraged him to consider other options such as lidocaine patches and follow up with his PCP for other pain remedies.  Diet: heart healthy PPJDB:[id number]: Anticipated discharge in approximately 0 day(s).   PoLaureen OchsMedical Student 09/20/2017, 11:31 AM Pager: [phone] for Student Documentation:  I personally was present and performed or re-performed the history, physical exam and medical decision-making activities of this service and have verified that the service and findings are accurately documented in the student's note.  AmLorella NimrodMD 09/20/2017, 2:06 PM

## 2017-09-20 NOTE — Discharge Summary (Addendum)
Name: Reginald [name] MRN: [id number] DOB: Jun 29, 1970 47 y.o. PCP: Reginald Blitz, MD  Date of Admission: 09/18/2017  9:06 AM Date of Discharge: 09/20/2017 Attending Physician: Reginald Crews, MD  Discharge Diagnosis: 1. Bilateral Pulmonary Emboli  2. Urinary Tract Infection 3. Mild Anemia of Unknown Cause 4. S/p PPM, AV replacement, aneurysm repair 4/9  Principal Problem:   Pulmonary embolism ([place]) Active Problems:   Morbid obesity ([place])   Essential hypertension   Chronic diastolic congestive heart failure ([place])   Pulmonary hypertension (HCC)   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   Fever  Discharge Medications: Allergies as of 09/20/2017   No Known Allergies     Medication List    STOP taking these medications   aspirin 325 MG EC tablet   warfarin 5 MG tablet Commonly known as:  COUMADIN     TAKE these medications   acetaminophen 325 MG tablet Commonly known as:  TYLENOL Take 650 mg by mouth every 6 (six) hours as needed for mild pain.   albuterol 108 (90 Base) MCG/ACT inhaler Commonly known as:  PROVENTIL HFA;VENTOLIN HFA Inhale 2 puffs into the lungs every 6 (six) hours as needed for wheezing or shortness of breath.   allopurinol 300 MG tablet Commonly known as:  ZYLOPRIM Take 300 mg by mouth daily.   amiodarone 200 MG tablet Commonly known as:  PACERONE Take 1 tablet (200 mg total) by mouth 2 (two) times daily.   chlorhexidine 0.12 % solution Commonly known as:  PERIDEX Rinse with 15 mls twice daily for 30 seconds. Use after breakfast and at bedtime. Spit out excess. Do not swallow. What changed:    how much to take  how to take this  when to take this  additional instructions   citalopram 20 MG tablet Commonly known as:  CELEXA Take 20 mg by mouth at bedtime.   clonazePAM 1 MG tablet Commonly known as:  KLONOPIN Take 0.5-1 mg by mouth every 8 (eight) hours as needed for  anxiety.   docusate sodium 100 MG capsule Commonly known as:  COLACE Take 200 mg by mouth daily.   furosemide 40 MG tablet Commonly known as:  LASIX Take 40-80 mg by mouth daily.   ipratropium 0.02 % nebulizer solution Commonly known as:  ATROVENT Take 0.5 mg by nebulization every 6 (six) hours as needed for wheezing or shortness of breath.   lisinopril-hydrochlorothiazide 20-25 MG tablet Commonly known as:  PRINZIDE,ZESTORETIC Take 1 tablet by mouth daily.   metoprolol succinate 100 MG 24 hr tablet Commonly known as:  TOPROL-XL Take 1 tablet (100 mg total) by mouth daily. Take with or immediately following a meal.   potassium chloride 10 MEQ tablet Commonly known as:  K-DUR,KLOR-CON Take 10 mEq by mouth daily.   PROBIOTIC PO Take 1 capsule by mouth at bedtime.   Rivaroxaban 15 MG Tabs tablet Commonly known as:  XARELTO Take 1 tablet (15 mg total) by mouth 2 (two) times daily with a meal. For 21 days. Start taking on:  09/21/2017   sulfamethoxazole-trimethoprim 800-160 MG tablet Commonly known as:  BACTRIM DS,SEPTRA DS Take 1 tablet by mouth every 12 (twelve) hours for 5 days.       Disposition and follow-up:   ReginaldReginald [name] was discharged from [facility] in Stable condition.  At the hospital follow up visit please address:  1. Resolution of hospital problems:   A. Anticoagulation: Continue taking rivaroxaban  15 mg BID for a total of 21 days (end date: 10/11/17) then switch to 20 mg once daily for at least three months. Decision to continue [facility] beyond that time will be at the discretion of his cardiologists. Aspirin was discontinued due to bleeding risk. B. Sternal wound healing: Continue to monitor drainage. Home health to assist with care. C. Serratia UTI: Continue Bactrim until 09/25/2017 (5-day course) D. Chronic Pain: Reginald [name] was counseled to avoid NSAIDs in the setting of anticoagulation. He typically prefers ibuprofen for his arthritis (mostly  shoulders) but states that his pain is refractory to Tylenol. He may benefit from other options such as patches or injections.  2.  Labs / imaging needed at time of follow-up: None  3.  Pending labs/ test needing follow-up: Iron Panel (evaluating for iron deficiency anemia)  Follow-up Appointments: Follow-up Information    Reginald Blitz, MD. Schedule an appointment as soon as possible for a visit.   Specialty:  Internal Medicine Contact information: [place] [address] [phone]        Herminio Commons, MD .   Specialty:  Cardiology Contact information: [place] [address] [phone]           Hospital Course by problem list: Principal Problem:   Pulmonary embolism [facility]) Active Problems:   Morbid obesity ([place])   Essential hypertension   Chronic diastolic congestive heart failure ([place])   Pulmonary hypertension (HCC)   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   Fever   Reginald [name] is a 47 yoM with a PMHx of dCHF, HTN, COPD, OSA, chronic venous insufficiency, gout and morbid obesity who presented to the ED on 09/18/17 (21 days post aortic valve replacement, thoracic aneurysm repair and subsequent PPM placement) from INR clinic with fever and chills.  #Pulmonary Emboli: On admission, Reginald [name] was found to havemild to moderate bilateral pulmonary emboli L>R without evidence of RH strain. Though he was on warfarin as an outpatient for the three weeks following his surgeries for his aortic valve and new-onset atrial fibrillation, he had yet to reach therapeutic levels. This was considered as one source of his fever, chest pain, lactic acidosis, and chills, but infectious sources were also investigated. In the ED, he was placed on a heparin drip, which was continued until discharge, though despite best interventions by the pharmacy team he was unable to reach therapeutic levels (peaked at 0.25;  goal 0.3-0.7). After discussion with the cardiology team, consideration of patient barriers to INR checks and lifestyle preference, as well as need for persistent and reliable anticoagulation for resolution of his clot burden, the decision was made to place him on a direct oral anticoagulant. He will continue taking rivaroxaban 15 mg BID for a total of 21 days (end date: 10/11/17) then switch to 20 mg once daily for at least three months. Decision to continue [facility] at that time will be at the discretion of his cardiologists.  #Fever/Chills/Sepsis: Mr. Wellen initial presentation with fever above 102F, lactic acidosis, and leukocytosis, met criteria for sepsis and he was started on intravenous vancomycin and Zosyn empirically. A negative procalcitonin and negative CXR safely ruled out pneumonia. Urinalysis on admission only revealed moderate leukocytosis and few bacteria, but culture later returned positive for Serratia marcescens. Blood cultures showed no growth at time of discharge. Despite his lack of urinary symptoms, the decision was made to treat UTI and coverage was narrowed to Bactrim on day  of discharge. Bactrim should be continued for 5 more days (end date: 09/25/2017). While his pacemaker placement site had healed without incident, the bottom of his sternotomy incision continued to drain serous fluid. The wound was swabbed and returned with negative gram stain and no growth at time of discharge. Cardiothoracic surgery was consulted and opted not to debride at this time.  #Mild asymptomatic anemia: Mr. Cassetta Hgb on admission was 12.2, increased from 11.3 2-3 weeks ago. His levels continued to downtrend to 10.9 on hospital day 1, then again to 10.3 on hospital day 2. Mr. Firkus denies any symptoms related to these findings. An iron panel was ordered before discharge for further evaluation during outpatient follow-up.   #Arthritis: Mr. Kellis suffers from bilateral shoulder pain and diffuse joint pain  likely a result of OA. He prefers to take ibuprofen but was counseled that he may not continue this while taking heparin/warfarin/DOAC due to increased GI bleeding risk. He does not feel that tylenol adequately controls his pain without taking 4-5 684m tablets at one time. We have encouraged him to consider other options such as lidocaine patches and follow up with his PCP for other pain remedies.  All other chronic conditions were managed on home regimen.  Discharge Vitals:   BP 134/84   Pulse 61   Temp 97.8 F (36.6 C)   Resp (!) 22   Ht '5\' 7"'  (1.702 m)   Wt (!) 421 lb 8.3 oz (191.2 kg)   SpO2 100%   BMI 66.02 kg/m   Pertinent Labs, Studies, and Procedures:  Urinalysis +Serratia Marcescens  CTA:   Discharge Instructions: Discharge Instructions    Diet - low sodium heart healthy   Complete by:  As directed    Discharge instructions   Complete by:  As directed    It was pleasure taking care of you. We are giving you Bactrim which is an antibiotic for your urine infection for 5 more days, take it as directed twice daily and finish up all your medicine. Stop taking Coumadin-we are giving you another medicine called Xarelto, you will take it 15 mg twice daily with meals for 21 days and then you will need another prescription for 20 mg daily. Please follow-up with your primary care physician regularly to get next prescription for Xarelto.  Your cardiologist or cardiothoracic surgeon can also prescribe it. We are stopping your aspirin and it can increase the chances of bleeding while you are on Xarelto. Try to avoid taking any ibuprofen as it can also increase the chances of bleeding while on Xarelto. You can discuss pain management with your primary care physician or surgeon. Please follow-up with your cardiologist and cardiothoracic surgery according to your scheduled appointment. If you start getting fever or your discharge get worse please contact your surgeon.   Increase activity  slowly   Complete by:  As directed       Signed: PLaureen Ochs Medical Student 09/20/2017, 1:42 PM   Pager: [phone] Attestation for Student Documentation:  I personally was present and performed or re-performed the history, physical exam and medical decision-making activities of this service and have verified that the service and findings are accurately documented in the student's note.  ALorella Nimrod MD 09/20/2017, 7:59 PM

## 2017-09-20 NOTE — Discharge Instructions (Signed)
Information on my medicine - XARELTO (rivaroxaban)  This medication education was reviewed with me or my healthcare representative as part of my discharge preparation.  The pharmacist that spoke with me during my hospital stay was:  Norva Riffle, [facility]  WHY WAS XARELTO PRESCRIBED FOR YOU? Xarelto was prescribed to treat blood clots that may have been found in the veins of your legs (deep vein thrombosis) or in your lungs (pulmonary embolism) and to reduce the risk of them occurring again.  What do you need to know about Xarelto? The starting dose is one 15 mg tablet taken TWICE daily with food for the FIRST 21 DAYS then on 10/11/17  the dose is changed to one 20 mg tablet taken ONCE A DAY with your evening meal.  DO NOT stop taking Xarelto without talking to the health care provider who prescribed the medication.  Refill your prescription for 20 mg tablets before you run out.  After discharge, you should have regular check-up appointments with your healthcare provider that is prescribing your Xarelto.  In the future your dose may need to be changed if your kidney function changes by a significant amount.  What do you do if you miss a dose? If you are taking Xarelto TWICE DAILY and you miss a dose, take it as soon as you remember. You may take two 15 mg tablets (total 30 mg) at the same time then resume your regularly scheduled 15 mg twice daily the next day.  If you are taking Xarelto ONCE DAILY and you miss a dose, take it as soon as you remember on the same day then continue your regularly scheduled once daily regimen the next day. Do not take two doses of Xarelto at the same time.   Important Safety Information Xarelto is a blood thinner medicine that can cause bleeding. You should call your healthcare provider right away if you experience any of the following: ? Bleeding from an injury or your nose that does not stop. ? Unusual colored urine (red or dark brown) or unusual  colored stools (red or black). ? Unusual bruising for unknown reasons. ? A serious fall or if you hit your head (even if there is no bleeding).  Some medicines may interact with Xarelto and might increase your risk of bleeding while on Xarelto. To help avoid this, consult your healthcare provider or pharmacist prior to using any new prescription or non-prescription medications, including herbals, vitamins, non-steroidal anti-inflammatory drugs (NSAIDs) and supplements.  This website has more information on Xarelto: [email or web address]

## 2017-09-20 NOTE — Progress Notes (Addendum)
ANTICOAGULATION CONSULT NOTE - Follow Up Consult  Pharmacy Consult for heparin Indication: pulmonary embolus  Labs: Recent Labs    09/18/17 0739  09/18/17 0913  09/19/17 0630 09/19/17 1533 09/20/17 0007 09/20/17 0206 09/20/17 0841  HGB  --    < > 12.2*  --  10.9*  --   --  10.3*  --   HCT  --   --  38.4*  --  34.7*  --   --  32.8*  --   PLT  --   --  326  --  287  --   --  286  --   LABPROT  --   --  16.0*  --   --   --   --   --   --   INR 1.4  --  1.29  --   --   --   --   --   --   HEPARINUNFRC  --   --   --    < > 0.17* 0.25* 0.17*  --  0.47  CREATININE  --   --  1.08  --  0.96  --   --   --   --    < > = values in this interval not displayed.    Assessment: 47yo male recently started on anticoagulation with Coumadin for post-op atrial fibrillation after his recent bioprosthetic aortic valve replacement. His INR has not been >2 since he was started on Coumadin.  He was admitted 4/30 with a new pulmonary embolism and is now on anticoagulation with heparin.  His heparin level is now therapeutic after multiple rate increases. His hemoglobin has decreased slightly from yesterday. His platelet count is stable. No bleeding complications noted.  Goal of Therapy:  Heparin level 0.3-0.7 units/ml   Plan:  Continue heparin at 3600 units/hr Check heparin level in 6 hours to confirm Daily heparin level and CBC Monitor for bleeding complications Anticipate transition to Xarelto per IMTS notes  Legrand Como, Pharm.D., BCPS, BCIDP Clinical Pharmacist Phone: [phone] 09/20/2017, 11:56 AM   Request received to change Heparin to Xarelto. His heparin bag is currently off so will go ahead and start his Xarelto now. Xarelto 15mg  PO BID x 21 days, then 20mg  PO qday  Legrand Como, Pharm.D., BCPS, BCIDP Clinical Pharmacist Phone:  [phone] 09/20/2017, 12:17 PM

## 2017-09-20 NOTE — Care Management Note (Signed)
Case Management Note  Patient Details  Name: ALDAHIR LITAKER MRN: [id number] Date of Birth: February 13, 1971  Subjective/Objective:  From home, PE, MD drug of choice is eliquis, NCM gave patient the 30 day savings coupon and he has co pay of zero dollars.  He is also active with [facility] for [facility], will need resumption orders.                   Action/Plan: DC home with [facility] with [facility] resumed.  Expected Discharge Date:                  Expected Discharge Plan:  [place]  In-House Referral:     Discharge planning Services  CM Consult  Post Acute Care Choice:  Home Health, Resumption of Svcs/PTA Provider Choice offered to:  Patient  DME Arranged:  N/A DME Agency:  NA  HH Arranged:  RN [place] Agency:  [employer]  Status of Service:  Completed, signed off  If discussed at [place] of Stay Meetings, dates discussed:    Additional Comments:  Zenon Mayo, RN 09/20/2017, 10:32 AM

## 2017-09-20 NOTE — Progress Notes (Signed)
Patient on room air, with no signs or symptoms of respiratory distress.  No complaints of inhalation or exhalation pain.  Patient did complain of general discomfort when lying in bed, Motrin given per prn order.  Unfractionated heparin level was low at approx 1230 am.  Pharmacy called and ordered a 5,000 unit bolus and to increase heparin from 32 units to 36 units.  IV antibiotics given per order via left hand peripheral IV.  Patient tolerated all care and medications well.  Safety and comfort measures maintained, call bell within reach.

## 2017-09-20 NOTE — Progress Notes (Signed)
  Date: 09/20/2017  Patient name: Reginald [name]  Medical record number: [id number]  Date of birth: 12-23-1970   I have seen and evaluated this patient and I have discussed the plan of care with the house staff. Please see their note for complete details. I concur with their findings with the following additions/corrections: [name] was seen on AM rounds with team.   1. Provoked PE - still not therapeutic on warfarin. CVTS agrees with DOAC despite bioprosthetic valve. D/C on DOAC,  2. 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  3. Serratia UTI - asymptomatic but would have had foley during recent hospitalization and did have > 100,000 colonies. Complete PO ABX as outpt with bactrim.  4. A Fib in presence of bioprosthetic valve - DOAC as pt never achieved therapeutic INR on warfarin as outpt and doesn't like dietary restrictions. Stop ASA 325 mg as no clinica indicators and just increases bleeding risk.  5. Chronic pain - shoulder pain and sternal post op pain. Not responsive to APAP. I do not want him to use NSAID as that will increase bleeding risk. Encouraged him to see his PCP for other options like steroid injection, PT, or duloxetine.   D/C home today  Bartholomew Crews, MD 09/20/2017, 11:31 AM

## 2017-09-21 ENCOUNTER — Telehealth: Payer: Self-pay | Admitting: *Deleted

## 2017-09-21 ENCOUNTER — Ambulatory Visit (INDEPENDENT_AMBULATORY_CARE_PROVIDER_SITE_OTHER): Payer: BLUE CROSS/BLUE SHIELD | Admitting: Student

## 2017-09-21 ENCOUNTER — Encounter: Payer: Self-pay | Admitting: Student

## 2017-09-21 VITALS — BP 132/82 | HR 67 | Temp 98.6°F | Ht 71.0 in | Wt >= 6400 oz

## 2017-09-21 DIAGNOSIS — I10 Essential (primary) hypertension: Secondary | ICD-10-CM | POA: Diagnosis not present

## 2017-09-21 DIAGNOSIS — I350 Nonrheumatic aortic (valve) stenosis: Secondary | ICD-10-CM

## 2017-09-21 DIAGNOSIS — Z720 Tobacco use: Secondary | ICD-10-CM | POA: Diagnosis not present

## 2017-09-21 DIAGNOSIS — I442 Atrioventricular block, complete: Secondary | ICD-10-CM | POA: Diagnosis not present

## 2017-09-21 DIAGNOSIS — Z953 Presence of xenogenic heart valve: Secondary | ICD-10-CM | POA: Diagnosis not present

## 2017-09-21 DIAGNOSIS — I480 Paroxysmal atrial fibrillation: Secondary | ICD-10-CM | POA: Diagnosis not present

## 2017-09-21 DIAGNOSIS — I2699 Other pulmonary embolism without acute cor pulmonale: Secondary | ICD-10-CM

## 2017-09-21 MED ORDER — LISINOPRIL 20 MG PO TABS: 20.0000 mg | ORAL_TABLET | Freq: Every day | ORAL | 3 refills | Status: DC

## 2017-09-21 MED ORDER — METOPROLOL SUCCINATE ER 100 MG PO TB24: 100.0000 mg | ORAL_TABLET | Freq: Every day | ORAL | 3 refills | Status: DC

## 2017-09-21 MED ORDER — AMIODARONE HCL 200 MG PO TABS: 200.0000 mg | ORAL_TABLET | Freq: Every day | ORAL | 3 refills | Status: DC

## 2017-09-21 NOTE — Progress Notes (Signed)
Cardiology Office Note    Date:  09/21/2017   ID:  Reginald [name], DOB [birth year], MRN [id number]  PCP:  Monico Blitz, MD  Cardiologist: Kate Sable, MD    Chief Complaint  Patient presents with  . Hospitalization Follow-up    History of Present Illness:    Reginald [name] is a 47 y.o. male with past medical history of HTN, HLD, and aortic stenosis who presents to the office today for hospital follow-up.   He was initially examined by Dr. Bronson Ing in 04/2017 and of new onset shortness of breath over the past several years. An echocardiogram was obtained for initial evaluation which showed a preserved EF of 60 to 65% but he was noted to have severe aortic stenosis. He was referred to CT surgery for further evaluation and underwent a TEE on 07/16/2017 which showed a bicuspid aortic valve with severe AS as his mean gradient was 46 mm Hg. A cardiac catheterization was also performed at that time which showed no angiographic evidence of CAD. He was evaluated by Dr. Roxy Manns following testing and AVR was recommended. He underwent aortic valve replacement using a Resilia Bovine Pericardial Tissue Aortic Valve and thoracic aortic aneurysm repair on 08/23/2017. He developed atrial fibrillation in the postoperative setting with poor AV node conduction and ultimately CHB. He required pacemaker placement on 08/28/2017 by Dr. Rayann Heman with a [place] MRI model LPA1200M- 52 (serial number  [id number]). Converted back to NSR on 08/31/2017. He was placed on Amiodarone, Coumadin, and BB therapy due to his atrial fibrillation.   Was evaluated in the atrial fibrillation clinic on 09/06/2017 and reported overall doing well from a cardiac perspective at that time. He was continued on Amiodarone 200 mg twice daily with consideration of dose reduction at his follow-up visit today. In the interim, he presented to the office on 09/18/2017 for an INR check but reported having chills and chest discomfort at that time.  He was febrile to 102.2 and it was recommended he go to [facility] ED for further evaluation. He was found to be septic while there as WBC was elevated to 16.7 and lactic acid was at 2.33. CTA on 09/18/2017 showed a mild to moderate amount of acute bilateral pulmonary emboli, greater on the left, without evidence of right heart strain  He was started on heparin upon admission and this was eventually transitioned to Xarelto at the time of discharge as he had never been therapeutic while on Coumadin.   In talking with the patient today, he was just discharged from the hospital yesterday but reports that he is very excited to be home. He says breathing has continued to improve and he denies any associated orthopnea or PND.  He received a significant amount of fluids while in the hospital due to his elevated lactic acid and has been taking Lasix 40 mg twice daily since discharge. He has also remained on Lisinopril-HCTZ during this timeframe.   He reports his sternal chest discomfort continues to improve. Denies any exertional pain. No recent palpitations. He has filled his prescription for Xarelto and started this yesterday evening.  Past Medical History:  Diagnosis Date  . Anxiety   . Aortic stenosis   . Asthma   . Back pain   . Cellulitis of skin with lymphangitis   . CHF (congestive heart failure) ([place])   . Chronic diastolic congestive heart failure ([place])   . Chronic venous insufficiency   . Constipation   . COPD (chronic  obstructive pulmonary disease) ([place])   . Dyspnea   . Essential hypertension   . Gout   . Hypertension   . Morbid obesity ([place])   . Obesity   . Pneumonia    walking pneumonia  . Prostatitis   . Pulmonary hypertension ([place])   . S/P aortic valve replacement with bioprosthetic valve 08/23/2017   25 mm Edwards Inspiris Resilia stented bovine pericardial tissue valve  . 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 Sleep apnea      cpap   . Thoracic ascending aortic aneurysm ([place])   . Thoracic ascending aortic aneurysm ([place])   . Tobacco abuse     Past Surgical History:  Procedure Laterality Date  . AORTIC VALVE REPLACEMENT N/A 08/23/2017   Procedure: AORTIC VALVE REPLACEMENT (AVR) USING INSPIRIS RESILIA AORTIC VALVE SIZE 25 MM;  Surgeon: Rexene Alberts, MD;  Location: [place];  Service: Open Heart Surgery;  Laterality: N/A;  . MULTIPLE EXTRACTIONS WITH ALVEOLOPLASTY N/A 07/23/2017   Procedure: Extraction of tooth #10 with alveoloplasty and gross debridement of remaining teeth;  Surgeon: Lenn Cal, DDS;  Location: [place];  Service: Oral Surgery;  Laterality: N/A;  . NO PAST SURGERIES    . PACEMAKER IMPLANT N/A 08/28/2017   Procedure: PACEMAKER IMPLANT;  Surgeon: [place] Grayer, MD;  Location: [facility] CV LAB;  Service: Cardiovascular;  Laterality: N/A;  . TEE WITHOUT CARDIOVERSION N/A 07/16/2017   Procedure: TRANSESOPHAGEAL ECHOCARDIOGRAM (TEE);  Surgeon: Lelon Perla, MD;  Location: [facility] ENDOSCOPY;  Service: Cardiovascular;  Laterality: N/A;  . TEE WITHOUT CARDIOVERSION N/A 08/23/2017   Procedure: TRANSESOPHAGEAL ECHOCARDIOGRAM (TEE);  Surgeon: Rexene Alberts, MD;  Location: [place];  Service: Open Heart Surgery;  Laterality: N/A;  . THORACIC AORTIC ANEURYSM REPAIR N/A 08/23/2017   Procedure: THORACIC ASCENDING ANEURYSM REPAIR (AAA) USING HEMASHIELD GOLD KNITTED MICROVEL DOUBLE VELOUR VASCULAR GRAFT D: 24 MM  L: 30 CM;  Surgeon: Rexene Alberts, MD;  Location: [place];  Service: Open Heart Surgery;  Laterality: N/A;    Current Medications: Outpatient Medications Prior to Visit  Medication Sig Dispense Refill  . acetaminophen (TYLENOL) 325 MG tablet Take 650 mg by mouth every 6 (six) hours as needed for mild pain.    Marland Kitchen albuterol (PROVENTIL HFA;VENTOLIN HFA) 108 (90 Base) MCG/ACT inhaler Inhale 2 puffs into the lungs every 6 (six) hours as needed for wheezing or shortness of breath.     . allopurinol (ZYLOPRIM)  300 MG tablet Take 300 mg by mouth daily.    . chlorhexidine (PERIDEX) 0.12 % solution Rinse with 15 mls twice daily for 30 seconds. Use after breakfast and at bedtime. Spit out excess. Do not swallow. (Patient taking differently: Use as directed 15 mLs in the mouth or throat 2 (two) times daily. Rinse with 15 mls twice daily for 30 seconds. Use after breakfast and at bedtime. Spit out excess. Do not swallow.) 480 mL prn  . citalopram (CELEXA) 20 MG tablet Take 20 mg by mouth at bedtime.  2  . clonazePAM (KLONOPIN) 1 MG tablet Take 0.5-1 mg by mouth every 8 (eight) hours as needed for anxiety.    . docusate sodium (COLACE) 100 MG capsule Take 200 mg by mouth daily.     . furosemide (LASIX) 40 MG tablet Take 40-80 mg by mouth daily.     Marland Kitchen ipratropium (ATROVENT) 0.02 % nebulizer solution Take 0.5 mg by nebulization every 6 (six) hours as needed  for wheezing or shortness of breath.    . potassium chloride (K-DUR,KLOR-CON) 10 MEQ tablet Take 10 mEq by mouth daily.    . Probiotic Product (PROBIOTIC PO) Take 1 capsule by mouth at bedtime.    . Rivaroxaban (XARELTO) 15 MG TABS tablet Take 1 tablet (15 mg total) by mouth 2 (two) times daily with a meal. For 21 days. 42 tablet 0  . sulfamethoxazole-trimethoprim (BACTRIM DS,SEPTRA DS) 800-160 MG tablet Take 1 tablet by mouth every 12 (twelve) hours for 5 days. 10 tablet 0  . amiodarone (PACERONE) 200 MG tablet Take 1 tablet (200 mg total) by mouth 2 (two) times daily. 30 tablet 1  . lisinopril-hydrochlorothiazide (PRINZIDE,ZESTORETIC) 20-25 MG tablet Take 1 tablet by mouth daily.    . metoprolol succinate (TOPROL-XL) 100 MG 24 hr tablet Take 1 tablet (100 mg total) by mouth daily. Take with or immediately following a meal. 30 tablet 1   No facility-administered medications prior to visit.      Allergies:   Patient has no known allergies.   Social History   Socioeconomic History  . Marital status: Single    Spouse name: Not on file  . Number of  children: Not on file  . Years of education: Not on file  . Highest education level: Not on file  Occupational History  . Not on file  Social Needs  . Financial resource strain: Not on file  . Food insecurity:    Worry: Not on file    Inability: Not on file  . Transportation needs:    Medical: Not on file    Non-medical: Not on file  Tobacco Use  . Smoking status: Current Every Day Smoker    Packs/day: 0.50    Types: Cigarettes  . Smokeless tobacco: Never Used  Substance and Sexual Activity  . Alcohol use: No    Frequency: Never    Comment: have had alcohol in the past, not heavy  . Drug use: No  . Sexual activity: Not on file  Lifestyle  . Physical activity:    Days per week: Not on file    Minutes per session: Not on file  . Stress: Not on file  Relationships  . Social connections:    Talks on phone: Not on file    Gets together: Not on file    Attends religious service: Not on file    Active member of club or organization: Not on file    Attends meetings of clubs or organizations: Not on file    Relationship status: Not on file  Other Topics Concern  . Not on file  Social History Narrative  . Not on file     Family History:  [name] family history includes Alzheimer's disease in his mother; Diabetes in his brother; Heart attack in his brother; Hypertension in his mother.   Review of Systems:   Please see the history of present illness.     General:  No chills, fever, night sweats or weight changes.  Cardiovascular:  No orthopnea, palpitations, paroxysmal nocturnal dyspnea. Positive for sternal chest pain, dyspnea on exertion, and edema.  Dermatological: No rash, lesions/masses Respiratory: No cough, dyspnea Urologic: No hematuria, dysuria Abdominal:   No nausea, vomiting, diarrhea, bright red blood per rectum, melena, or hematemesis Neurologic:  No visual changes, wkns, changes in mental status. All other systems reviewed and are otherwise negative  except as noted above.   Physical Exam:    VS:  BP 132/82   Pulse  67   Temp 98.6 F (37 C) (Oral)   Ht 5\' 11"  (1.803 m)   Wt (!) 429 lb (194.6 kg)   SpO2 98%   BMI 59.83 kg/m    General: Morbidly obese Caucasian male appearing in no acute distress. Head: Normocephalic, atraumatic, sclera non-icteric, no xanthomas, nares are without discharge.  Neck: No carotid bruits. JVD not elevated.  Lungs: Respirations regular and unlabored, without wheezes or rales.  Heart: Regular rate and rhythm. No S3 or S4.  No murmur, no rubs, or gallops appreciated. Sternal incision appears well-healing. Minimal drainage along distal aspect of sternal scar (evaluated by CT Surgeon during recent admission per the patients report and continued observation was recommended). PPM site is well-healing with no significant erythema or drainage.  Abdomen: Soft, non-tender, non-distended with normoactive bowel sounds. No hepatomegaly. No rebound/guarding. No obvious abdominal masses. Msk:  Strength and tone appear normal for age. No joint deformities or effusions. Extremities: No clubbing or cyanosis. 1+ pitting edema up to knees bilaterally.  Distal pedal pulses are 2+ bilaterally. Neuro: Alert and oriented X 3. Moves all extremities spontaneously. No focal deficits noted. Psych:  Responds to questions appropriately with a normal affect. Skin: No rashes or lesions noted  Wt Readings from Last 3 Encounters:  09/21/17 (!) 429 lb (194.6 kg)  09/20/17 (!) 421 lb 8.3 oz (191.2 kg)  09/06/17 (!) 425 lb 3.2 oz (192.9 kg)     Studies/Labs Reviewed:   EKG:  EKG is not ordered today.   Recent Labs: 08/30/2017: Magnesium 2.3 09/18/2017: ALT 24; B Natriuretic Peptide 318.2 09/19/2017: BUN 16; Creatinine, Ser 0.96; Potassium 3.4; Sodium 137 09/20/2017: Hemoglobin 10.3; Platelets 286   Lipid Panel No results found for: CHOL, TRIG, HDL, CHOLHDL, VLDL, LDLCALC, LDLDIRECT  Additional studies/ records that were reviewed today  include:   Echocardiogram: 05/25/2017 Study Conclusions  - Left ventricle: The cavity size was normal. Wall thickness was   increased in a pattern of moderate LVH. Systolic function was   normal. The estimated ejection fraction was in the range of 60%   to 65%. Wall motion was normal; there were no regional wall   motion abnormalities. Left ventricular diastolic function   parameters were normal. - Aortic valve: There was severe stenosis. Mean gradient (S): 48 mm   Hg. Valve area (VTI): 0.99 cm^2. Valve area (Vmax): 0.98 cm^2.   Valve area (Vmean): 0.88 cm^2. - Aorta: The visualized portion of the proximal ascending aorts is   severely dilated at 5.2 cm. Recommend CTA or MRA to better   evaluate. - Technically difficult study, echocontrast was used to enhance   visualization.  Cardiac Catheterization: 07/16/2017 1. No angiographic evidence of CAD 2. Severe aortic stenosis by TEE/TTE.  Due to turbulent flow in the ascending aorta and movement of the catheters, I could not cross the valve. The patient had a TEE this am that demonstrated severe AS. I did not feel that further attempts at crossing the valve would provide any clinical data that would change his treatment plan, though more attempts increased the risk of procedure complication. 3. Normal right heart pressures  Recommendations: Will continue planning for AVR vs TAVR. Pt to f/u with Dr. Roxy Manns after CT scans.   Assessment:    1. Aortic valve stenosis, etiology of cardiac valve disease unspecified   2. S/P aortic valve replacement with bioprosthetic valve   3. Paroxysmal atrial fibrillation (HCC)   4. CHB (complete heart block) (HCC)   5. Acute pulmonary embolism  without acute cor pulmonale, unspecified pulmonary embolism type ([place])   6. Essential hypertension   7. Tobacco abuse disorder      Plan:   In order of problems listed above:  1. Aortic Stenosis - recent TEE showed a bicuspid aortic valve with severe AS as  his mean gradient was 46 mm Hg. Underwent AVR on 08/23/2017 by Dr. Roxy Manns using a Resilia Bovine Pericardial Tissue Aortic Valve and thoracic aortic aneurysm repair. - reports overall doing well and continues to improve since surgery. Sternal pain is manageable with Tylenol. He did notice worsening dyspnea in the setting of his PE but this is improving.  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 I informed him he will need to further discuss with Dr. Roxy Manns when he will be able to return to work but he should not drive any vehicles at this time as his sternal incision continues to heal (he drove himself to today's appointment due to not having reliable transportation).  - will remain on Xarelto for reasons mentioned below. Verified with CT Surgery that he does not need to be on ASA 325 mg daily as initially prescribed in 08/2017.  2. Paroxysmal Atrial Fibrillation - diagnosed in the post-operative setting and being followed by the [facility].  - currently remains on Amiodarone 200mg  BID and Toprol-XL 100mg  daily. Will reduce Amiodarone to 200mg  daily as discussed in prior office notes. He has been started on anticoagulation in the setting of his AF and recent PE. Was transitioned from Coumadin to Xarelto during his recent hospitalization due to difficulty with sub-therapeutic INR's.   3. CHB  - s/p pacemaker placement on 08/28/2017 by Dr. Rayann Heman with a [place] MRI model LPA1200M- 52 (serial number  [id number]).  - PPM site appears well-healing. Device interrogated by [employer] today. Official report pending.   4. Pulmonary Emboli - CTA on 09/18/2017 showed a mild to moderate amount of acute bilateral pulmonary emboli, greater on the left, without evidence of right heart strain. Was transitioned to Xarelto at the time of discharge as INR had never been therapeutic while on Coumadin. He is asking when he will be able to come off of anticoagulation and we  reviewed that he will require at least 6 months of therapy and potentially lifelong if he has recurrent episodes of AF.   5. HTN - BP is at 132/82 during today's visit.  - he is currently on Lisinopril-HCTZ 20-25mg  daily, Lasix 40mg  daily, and Toprol-XL 100mg  daily. He is currently on dual diuretic therapy and I recommended we stop HCTZ and that he take Lisinopril along while continuing on Lasix in the setting of his edema. May require further titration of Lisinopril dosing.   6. Tobacco Use - he has not smoked since his recent surgery. Was congratulated on this with continued cessation advised.     Medication Adjustments/Labs and Tests Ordered: Current medicines are reviewed at length with the patient today.  Concerns regarding medicines are outlined above.  Medication changes, Labs and Tests ordered today are listed in the Patient Instructions below. Patient Instructions  Medication Instructions:  Your physician has recommended you make the following change in your medication:  Stop Taking Lisinopril/HCTZ  Start Taking Lisinopril 20 mg Daily  Start Taking Amiodarone 200 mg Daily   Labwork: NONE   Testing/Procedures: NONE   Follow-Up: Your physician recommends that you schedule a follow-up appointment in: 6-8 Weeks   Any Other Special Instructions Will  Be Listed Below (If Applicable).  If you need a refill on your cardiac medications before your next appointment, please call your pharmacy. Thank you for choosing [facility]!     Signed, Erma Heritage, PA-C  09/21/2017 9:34 PM    [facility] [address] [place], [place] [address] Phone: [phone]

## 2017-09-21 NOTE — Patient Instructions (Signed)
Medication Instructions:  Your physician has recommended you make the following change in your medication:  Stop Taking Lisinopril/HCTZ  Start Taking Lisinopril 20 mg Daily  Start Taking Amiodarone 200 mg Daily    Labwork: NONE   Testing/Procedures: NONE   Follow-Up: Your physician recommends that you schedule a follow-up appointment in: 6-8 Weeks    Any Other Special Instructions Will Be Listed Below (If Applicable).     If you need a refill on your cardiac medications before your next appointment, please call your pharmacy. Thank you for choosing [facility]!

## 2017-09-21 NOTE — Telephone Encounter (Signed)
Spoke with [employer] ([employer] and she will be able to check Reginald [name] pacemaker while he is in the [place] office this afternoon (to check for episodes 09/14/17). She has a wifi adapter for his monitor.   Reginald [name] made aware, he is agreeable. 10:30 appt for PPM check in [place] cancelled.

## 2017-09-22 LAB — AEROBIC CULTURE  (SUPERFICIAL SPECIMEN)

## 2017-09-22 LAB — AEROBIC CULTURE W GRAM STAIN (SUPERFICIAL SPECIMEN): Gram Stain: NONE SEEN

## 2017-09-23 LAB — CULTURE, BLOOD (ROUTINE X 2)
CULTURE: NO GROWTH
CULTURE: NO GROWTH
SPECIAL REQUESTS: ADEQUATE

## 2017-09-27 ENCOUNTER — Telehealth: Payer: Self-pay | Admitting: *Deleted

## 2017-09-27 NOTE — Telephone Encounter (Signed)
Spoke with patient to advise him that his [place]lin monitor is now updated with the WiFi adapter.  Discussed patient's PPM incision.  He reports that it continues to improve, but a small scab remains in place.  He has not noticed any drainage, redness, or swelling at this site.  He completed course of Bactrim as instructed at hospital d/c on 5/2, has not noted fever/chills since then.  At appointment with Bernerd Pho, PA, on 5/3, PPM site was noted to appear "well-healing".  As patient is already scheduled with [facility] for sternal wound check on 10/01/17 at 4pm, will have him come to the [facility] for a wound recheck on 5/13 at 3pm while Dr. Rayann Heman is in the office.  Patient is agreeable to plan and appreciative of call.

## 2017-10-01 ENCOUNTER — Other Ambulatory Visit: Payer: Self-pay

## 2017-10-01 ENCOUNTER — Ambulatory Visit (INDEPENDENT_AMBULATORY_CARE_PROVIDER_SITE_OTHER): Payer: Self-pay | Admitting: Physician Assistant

## 2017-10-01 ENCOUNTER — Ambulatory Visit (INDEPENDENT_AMBULATORY_CARE_PROVIDER_SITE_OTHER): Payer: Self-pay | Admitting: *Deleted

## 2017-10-01 VITALS — BP 124/83 | HR 77 | Resp 20 | Ht 71.0 in | Wt >= 6400 oz

## 2017-10-01 DIAGNOSIS — Z952 Presence of prosthetic heart valve: Secondary | ICD-10-CM

## 2017-10-01 DIAGNOSIS — Z95828 Presence of other vascular implants and grafts: Secondary | ICD-10-CM

## 2017-10-01 DIAGNOSIS — I442 Atrioventricular block, complete: Secondary | ICD-10-CM

## 2017-10-01 DIAGNOSIS — I2699 Other pulmonary embolism without acute cor pulmonale: Secondary | ICD-10-CM

## 2017-10-01 MED ORDER — CEPHALEXIN 500 MG PO CAPS: 500.0000 mg | ORAL_CAPSULE | Freq: Two times a day (BID) | ORAL | 0 refills | Status: AC

## 2017-10-01 NOTE — Progress Notes (Signed)
Patient presents to the office for a wound recheck s/p ppm implant on 08/28/17. Incision edges mostly approximated. Small opening noted at the L corner of incision. Visible stitch clipped from the opening. Site is otherwise healing well without redness or edema. Dr.Allred assessed wound and recommended that patient start Keflex 500BID x7days and leave wound OTA. Patient to f/u with the device clinic on 5/22 for a wound recheck.

## 2017-10-01 NOTE — Progress Notes (Signed)
HPI: Patient returns for wound check.  He developed sternal drainage during hospitalization earlier this morning.  It was felt to likely be fat necrosis and he was instructed to report for wound check.  Since that time he developed a stitch abscess in his PPM pocket site and he has been started on Keflex for this.  He states his wound overall looks better.  He states there is still some minor drainage.  He does complain of his hands being numb in addition to the outer portion of his left leg.   He states he also feels tense a lot and is anxious to return to work.  Current Outpatient Medications  Medication Sig Dispense Refill  . acetaminophen (TYLENOL) 325 MG tablet Take 650 mg by mouth every 6 (six) hours as needed for mild pain.    Marland Kitchen albuterol (PROVENTIL HFA;VENTOLIN HFA) 108 (90 Base) MCG/ACT inhaler Inhale 2 puffs into the lungs every 6 (six) hours as needed for wheezing or shortness of breath.     . allopurinol (ZYLOPRIM) 300 MG tablet Take 300 mg by mouth daily.    Marland Kitchen amiodarone (PACERONE) 200 MG tablet Take 1 tablet (200 mg total) by mouth daily. 90 tablet 3  . chlorhexidine (PERIDEX) 0.12 % solution Rinse with 15 mls twice daily for 30 seconds. Use after breakfast and at bedtime. Spit out excess. Do not swallow. (Patient taking differently: Use as directed 15 mLs in the mouth or throat 2 (two) times daily. Rinse with 15 mls twice daily for 30 seconds. Use after breakfast and at bedtime. Spit out excess. Do not swallow.) 480 mL prn  . citalopram (CELEXA) 20 MG tablet Take 20 mg by mouth at bedtime.  2  . clonazePAM (KLONOPIN) 1 MG tablet Take 0.5-1 mg by mouth every 8 (eight) hours as needed for anxiety.    . docusate sodium (COLACE) 100 MG capsule Take 200 mg by mouth daily.     . furosemide (LASIX) 40 MG tablet Take 40-80 mg by mouth daily.     Marland Kitchen ipratropium (ATROVENT) 0.02 % nebulizer solution Take 0.5 mg by nebulization every 6 (six) hours as needed for wheezing or shortness of breath.    .  lisinopril (PRINIVIL,ZESTRIL) 20 MG tablet Take 1 tablet (20 mg total) by mouth daily. 90 tablet 3  . metoprolol succinate (TOPROL-XL) 100 MG 24 hr tablet Take 1 tablet (100 mg total) by mouth daily. Take with or immediately following a meal. 90 tablet 3  . potassium chloride (K-DUR,KLOR-CON) 10 MEQ tablet Take 10 mEq by mouth daily.    . Probiotic Product (PROBIOTIC PO) Take 1 capsule by mouth at bedtime.    . Rivaroxaban (XARELTO) 15 MG TABS tablet Take 1 tablet (15 mg total) by mouth 2 (two) times daily with a meal. For 21 days. 42 tablet 0  . cephALEXin (KEFLEX) 500 MG capsule Take 1 capsule (500 mg total) by mouth 2 (two) times daily for 7 days. (Patient not taking: Reported on 10/01/2017) 14 capsule 0   No current facility-administered medications for this visit.     Physical Exam:  BP 124/83 (BP Location: Right Arm, Patient Position: Sitting, Cuff Size: Large)   Pulse 77   Resp 20   Ht 5\' 11"  (1.803 m)   Wt (!) 429 lb (194.6 kg)   SpO2 98% Comment: RA  BMI 59.83 kg/m   Gen: no apparent distress Heart: RRR Lungs; CTA bilaterally Sternum: no evidence on infection, small 1-2 cm scabbed area along inferior portion  on apparent drainage present   A/P;  1. Returns for wound check of sternum-- appears to be clean and resolving, no drainage appreciated.. Continue daily cleansing with soap and water.. Stressed importance of keeping wound dry.. Care of PPM site per EP on Keflex 2. Parasthesias of hands, outer left leg.. Continue to monitor, should resolve with time... May benefit from PT on LE numbness if doesn't improve 3. RTC next week as scheduled with Dr. Roxy Manns and further wound check  Ellwood Handler, PA-C [facility] [phone]

## 2017-10-05 ENCOUNTER — Other Ambulatory Visit: Payer: Self-pay | Admitting: Thoracic Surgery (Cardiothoracic Vascular Surgery)

## 2017-10-05 DIAGNOSIS — Z953 Presence of xenogenic heart valve: Secondary | ICD-10-CM

## 2017-10-08 ENCOUNTER — Encounter: Payer: Self-pay | Admitting: Thoracic Surgery (Cardiothoracic Vascular Surgery)

## 2017-10-08 ENCOUNTER — Ambulatory Visit
Admission: RE | Admit: 2017-10-08 | Discharge: 2017-10-08 | Disposition: A | Discharge status: Home / Self Care | Payer: BLUE CROSS/BLUE SHIELD | Source: Ambulatory Visit | Attending: Thoracic Surgery (Cardiothoracic Vascular Surgery) | Admitting: Thoracic Surgery (Cardiothoracic Vascular Surgery)

## 2017-10-08 ENCOUNTER — Other Ambulatory Visit: Payer: Self-pay

## 2017-10-08 ENCOUNTER — Ambulatory Visit (INDEPENDENT_AMBULATORY_CARE_PROVIDER_SITE_OTHER): Payer: Self-pay | Admitting: Thoracic Surgery (Cardiothoracic Vascular Surgery)

## 2017-10-08 VITALS — BP 128/79 | HR 75 | Resp 20 | Ht 71.0 in | Wt >= 6400 oz

## 2017-10-08 DIAGNOSIS — Z953 Presence of xenogenic heart valve: Secondary | ICD-10-CM

## 2017-10-08 DIAGNOSIS — Z95828 Presence of other vascular implants and grafts: Secondary | ICD-10-CM

## 2017-10-08 NOTE — Progress Notes (Signed)
[address]       [place] [address]             [phone]     CARDIOTHORACIC SURGERY OFFICE NOTE  Referring Provider is Herminio Commons, MD PCP is Monico Blitz, MD   HPI:  Patient is a 47 year old morbidly obese male with long-standing history of heart murmur, chronic diastolic congestive heart failure, hypertension, chronic venous insufficiency, obstructive sleep apnea on CPAP, and long-standing tobacco abuse with COPD who returns to the office today for follow-up and wound check status post aortic valve replacement using a bioprosthetic tissue valve with supracoronary straight graft repair of ascending thoracic aortic aneurysm on August 23, 2017 for bicuspid aortic valve with severe symptomatic aortic stenosis and fusiform aneurysmal enlargement of the ascending thoracic aorta.  Postoperatively the patient developed complete heart block requiring permanent pacemaker placement.  He also had postoperative atrial fibrillation for which he was treated with amiodarone, Coumadin, and beta-blocker therapy.  The patient was rehospitalized September 18, 2017 with evidence of deep venous thrombosis, acute exacerbation chronic diastolic congestive heart failure, and pulmonary embolism.  He was still subtherapeutic on warfarin at that time.  Coumadin was stopped and he was switched to Xarelto.  At the time he had some mild drainage from the inferior aspect of his sternal incision which was felt likely to be related to fat necrosis.  He was seen in our office last week and returns to our office again for follow-up today.  Overall he states that he is doing okay but he is discouraged that he has not been improving more quickly.  He still gets tired easily with activity.  He states that sometimes his breathing is pretty good and other times he gets winded easily.  He states that intermittently he has some mild dizzy spells.  He still has soreness all across his chest.  He has not had  significant fevers or chills.  Drainage from the sternal incision has stopped completely.  Denies any sensation of clicking or motion of the sternum.  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  He also complains about some itchiness all over his arms   Current Outpatient Medications  Medication Sig Dispense Refill  . acetaminophen (TYLENOL) 325 MG tablet Take 650 mg by mouth every 6 (six) hours as needed for mild pain.    Marland Kitchen albuterol (PROVENTIL HFA;VENTOLIN HFA) 108 (90 Base) MCG/ACT inhaler Inhale 2 puffs into the lungs every 6 (six) hours as needed for wheezing or shortness of breath.     . allopurinol (ZYLOPRIM) 300 MG tablet Take 300 mg by mouth daily.    Marland Kitchen amiodarone (PACERONE) 200 MG tablet Take 1 tablet (200 mg total) by mouth daily. 90 tablet 3  . cephALEXin (KEFLEX) 500 MG capsule Take 1 capsule (500 mg total) by mouth 2 (two) times daily for 7 days. (Patient not taking: Reported on 10/01/2017) 14 capsule 0  . chlorhexidine (PERIDEX) 0.12 % solution Rinse with 15 mls twice daily for 30 seconds. Use after breakfast and at bedtime. Spit out excess. Do not swallow. (Patient taking differently: Use as directed 15 mLs in the mouth or throat 2 (two) times daily. Rinse with 15 mls twice daily for 30 seconds. Use after breakfast and at bedtime. Spit out excess. Do not swallow.) 480 mL prn  . citalopram (CELEXA) 20 MG tablet Take 20 mg by mouth at bedtime.  2  .  clonazePAM (KLONOPIN) 1 MG tablet Take 0.5-1 mg by mouth every 8 (eight) hours as needed for anxiety.    . docusate sodium (COLACE) 100 MG capsule Take 200 mg by mouth daily.     . furosemide (LASIX) 40 MG tablet Take 40-80 mg by mouth daily.     Marland Kitchen ipratropium (ATROVENT) 0.02 % nebulizer solution Take 0.5 mg by nebulization every 6 (six) hours as needed for wheezing or shortness of breath.    . lisinopril (PRINIVIL,ZESTRIL) 20 MG tablet Take 1 tablet (20 mg total) by mouth daily. 90 tablet 3  .  metoprolol succinate (TOPROL-XL) 100 MG 24 hr tablet Take 1 tablet (100 mg total) by mouth daily. Take with or immediately following a meal. 90 tablet 3  . potassium chloride (K-DUR,KLOR-CON) 10 MEQ tablet Take 10 mEq by mouth daily.    . Probiotic Product (PROBIOTIC PO) Take 1 capsule by mouth at bedtime.    . Rivaroxaban (XARELTO) 15 MG TABS tablet Take 1 tablet (15 mg total) by mouth 2 (two) times daily with a meal. For 21 days. 42 tablet 0   No current facility-administered medications for this visit.       Physical Exam:   BP 128/79 (BP Location: Left Arm, Patient Position: Sitting, Cuff Size: Large)   Pulse 75   Resp 20   Ht 5\' 11"  (1.803 m)   Wt (!) 429 lb (194.6 kg)   SpO2 98% Comment: ON RA  BMI 59.83 kg/m   General:  Morbidly obese  Chest:   Clear to auscultation with symmetrical breath sounds  CV:   Regular rate and rhythm without murmur  Incisions:  Healing nicely with stable sternum.  There is no sign of any ongoing drainage nor cellulitis  Abdomen:  Soft nontender  Extremities:  Warm and well-perfused with severe lower extremity edema that is actually improved from previously  Diagnostic Tests:  CHEST - 2 VIEW  COMPARISON:  CT chest and PA and lateral chest 09/18/2017.  FINDINGS: The patient is status post aortic valve replacement with a pacing device in place. Median sternotomy wires are intact and unchanged. There is cardiomegaly without edema. No consolidative process, pneumothorax or pleural effusion. No acute bony abnormality.  IMPRESSION: Cardiomegaly without acute disease.   Electronically Signed   By: Inge Rise M.D.   On: 10/08/2017 13:02    Impression:  Patient is making slow but steady progress.  His sternal drainage has resolved and his sternal incision looks good.  Plan:  I have instructed the patient to stop taking amiodarone as it is possible this may be the source of his chronic itchiness.  He has just completed his  course of oral cephalexin.  We have not recommended any other changes to his current medications.  I encouraged the patient to continue to gradually increase his physical activity has but I reminded him that he needs to refrain from antiplatelet and use of is on hold for at least another 2 months.  I have encouraged him to enroll and participate in the outpatient cardiac rehab program all of his questions have been addressed.    Valentina Gu. Roxy Manns, MD 10/08/2017 2:11 PM

## 2017-10-08 NOTE — Patient Instructions (Addendum)
Continue all other previous medications without any changes at this time  Continue to avoid any heavy lifting or strenuous use of your arms or shoulders for at least a total of three months from the time of surgery.  After three months you may gradually increase how much you lift or otherwise use your arms or chest as tolerated, with limits based upon whether or not activities lead to the return of significant discomfort.  You are encouraged to enroll and participate in the outpatient cardiac rehab program beginning as soon as practical.

## 2017-10-09 NOTE — Addendum Note (Signed)
Addended by: Charlena Cross F on: 10/09/2017 12:00 PM   Modules accepted: Orders

## 2017-10-10 ENCOUNTER — Ambulatory Visit: Payer: BLUE CROSS/BLUE SHIELD | Admitting: *Deleted

## 2017-10-10 DIAGNOSIS — I442 Atrioventricular block, complete: Secondary | ICD-10-CM

## 2017-10-10 MED ORDER — RIVAROXABAN 20 MG PO TABS: 20.0000 mg | ORAL_TABLET | Freq: Every day | ORAL | 3 refills | Status: DC

## 2017-10-10 NOTE — Progress Notes (Signed)
Patient seen today in clinic for wound re-check. Patient verbalized completion of prescribed antibiotics. Upon wound check left lateral incision scabbed, free of redness or swelling. JA assessed. Wound re-check in 2weeks. Xarelto 20mg  daily prescribed by Greggory Brandy - send to pharmacy on file per patient request.   Patient tearful upon discuss of returning to work. Patient states that if he is not able to return to work soon he will not have insurance and be unable to take any of his medications or continue receiving medical care. I explained that from a pacemaker standpoint he has no restrictions for work. He states that he has been told by Dr. Roxy Manns that he needs to get clearance from his primary cardiologist Dr. Bronson Ing. ROV with Dr. Raliegh Ip 6/28. I will message Dr. Bronson Ing recommendations on returning to work. Patient was agreeable.

## 2017-10-16 NOTE — Progress Notes (Signed)
Returned call to patient.  Explained to patient that Dr. Bronson Ing has been out of office this past week & today as well due to death in the family.  Suggested that he call back in a few weeks to see if we have any cancellations & can place him on wait list also.  Extender in [place] office did not have any openings till the same week as his original appointment.

## 2017-10-24 ENCOUNTER — Ambulatory Visit (INDEPENDENT_AMBULATORY_CARE_PROVIDER_SITE_OTHER): Payer: Self-pay | Admitting: *Deleted

## 2017-10-24 DIAGNOSIS — I442 Atrioventricular block, complete: Secondary | ICD-10-CM

## 2017-10-24 NOTE — Progress Notes (Signed)
Patient presents to the office for a wound recheck following his 10/10/17 appointment. Wound assessed by myself and Dr.Allred. Incision edges approximated. Wound well healed without redness or edema. Small scab noted at L lateral incision, but remains free of redness, swelling, or drainage. Patient was encouraged to continue to wash twice daily and follow up as scheduled. Patient was instructed to call for any signs or sx's of infection. Patient verbalized understanding.

## 2017-10-25 ENCOUNTER — Encounter: Payer: Self-pay | Admitting: Thoracic Surgery (Cardiothoracic Vascular Surgery)

## 2017-10-26 ENCOUNTER — Other Ambulatory Visit: Payer: Self-pay | Admitting: Internal Medicine

## 2017-11-01 ENCOUNTER — Encounter (HOSPITAL_COMMUNITY): Payer: BLUE CROSS/BLUE SHIELD

## 2017-11-02 ENCOUNTER — Telehealth: Payer: Self-pay | Admitting: *Deleted

## 2017-11-02 DIAGNOSIS — I5032 Chronic diastolic (congestive) heart failure: Secondary | ICD-10-CM

## 2017-11-02 DIAGNOSIS — I10 Essential (primary) hypertension: Secondary | ICD-10-CM

## 2017-11-02 DIAGNOSIS — Z95828 Presence of other vascular implants and grafts: Secondary | ICD-10-CM

## 2017-11-02 DIAGNOSIS — I272 Pulmonary hypertension, unspecified: Secondary | ICD-10-CM

## 2017-11-02 DIAGNOSIS — Z953 Presence of xenogenic heart valve: Secondary | ICD-10-CM

## 2017-11-02 NOTE — Telephone Encounter (Signed)
-----   Message from Herminio Commons, MD sent at 11/01/2017  3:37 PM EDT ----- Regarding: FW: Referral to Leanna Battles, Can you please assist with this?  ----- Message ----- From: Dwana Melena, RN Sent: 11/01/2017   3:12 PM To: Herminio Commons, MD Subject: Referral to Dietician                          Hey Dr. Raliegh Ip,  Per our discussion, please refer Mr. Willemsen to a dietician.  Thanks!  Jackelyn Poling

## 2017-11-02 NOTE — Telephone Encounter (Signed)
Request sent 

## 2017-11-14 ENCOUNTER — Encounter: Payer: Self-pay | Admitting: Nutrition

## 2017-11-14 ENCOUNTER — Encounter: Payer: BLUE CROSS/BLUE SHIELD | Attending: Internal Medicine | Admitting: Nutrition

## 2017-11-14 DIAGNOSIS — Z713 Dietary counseling and surveillance: Secondary | ICD-10-CM | POA: Insufficient documentation

## 2017-11-14 DIAGNOSIS — Z6841 Body Mass Index (BMI) 40.0 and over, adult: Secondary | ICD-10-CM | POA: Diagnosis not present

## 2017-11-14 DIAGNOSIS — I5032 Chronic diastolic (congestive) heart failure: Secondary | ICD-10-CM | POA: Insufficient documentation

## 2017-11-14 DIAGNOSIS — Z952 Presence of prosthetic heart valve: Secondary | ICD-10-CM | POA: Diagnosis not present

## 2017-11-14 DIAGNOSIS — I110 Hypertensive heart disease with heart failure: Secondary | ICD-10-CM | POA: Insufficient documentation

## 2017-11-14 DIAGNOSIS — I272 Pulmonary hypertension, unspecified: Secondary | ICD-10-CM | POA: Diagnosis not present

## 2017-11-14 DIAGNOSIS — I5030 Unspecified diastolic (congestive) heart failure: Secondary | ICD-10-CM

## 2017-11-14 NOTE — Progress Notes (Addendum)
  Medical Nutrition Therapy:  Appt start time: 1000 end time:  1100.   Assessment:  Primary concerns today:Obesity, CHF. Lives by himself. Eats 2-3 meals per day. Had aortic valvue replaced in April 2019. PMH: COPD, CHF, HTN,  Has been a truck driver-local. Cardiologist is Dr. Jethro Bolus.  PCP Dr. Manuella Ghazi. Current diet is high in salt, fat, sugar and excessive calories. C/o being dizzy often. Meals irregular. No exercise. No energy. Has depressed feelings and worried about getting back to work and paying bills and buying groceries.  Motivated to make changes with diet and lifestyle.    Preferenece learning:   No preference indicated   Learning Readiness:   Ready  Change in progress   MEDICATIONS:   DIETARY INTAKE:  24-hr recall:  B ( AM): 2 frisco sandwiches, Sweet tea- LG  Snk ( AM):   L ( PM): Can of tuna, SF Koolid Snk ( PM):  D ( PM): Marie callendards,Chili Potpie, SF Kooliad Snk ( PM):  HOt sausages-2 Beverages: Sweet tea,  Usual physical activity: ADL  Estimated energy needs: 1800  calories 200  g carbohydrates 135 g protein 50 g fat  Progress Towards Goal(s):  In progress.   Nutritional Diagnosis:  NI-1.5 Excessive energy intake As related to HIgh fat high salt diet.  As evidenced by BMI  60 and food recall.    Intervention:  Nutrition and Diabetes education provided on My Plate, CHO counting, meal planning, portion sizes, timing of meals, avoiding snacks between meals unless having a low blood sugar, target ranges for A1C and blood sugars, signs/symptoms and treatment of hyper/hypoglycemia, monitoring blood sugars, taking medications as prescribed, benefits of exercising 30 minutes per day and prevention of complications of DM. Goals 1. Follow My Plate 2. Increase fresh or frozen fruits, vegetables 3. Cut out processed foods 4. Cut out sausage, bacon, hot dogs  5. Drink only water Lose 1-2 lbs per week User Mrs. Dash Walk 15 minutes every day Don't eat  past 7 pm  Teaching Method Utilized:  Visual Auditory Hands on  Handouts given during visit include:  Meal Plan Card  Low Salt Diet  Heart Healthy Diet  Barriers to learning/adherence to lifestyle change: none  Demonstrated degree of understanding via:  Teach Back   Monitoring/Evaluation:  Dietary intake, exercise, meal plannign , and body weight in 1 month(s).

## 2017-11-14 NOTE — Patient Instructions (Signed)
Goals 1. Follow My Plate 2. Increase fresh or frozen fruits, vegetables 3. Cut out processed foods 4. Cut out sausage, bacon, hot dogs  5. Drink only water Lose 1-2 lbs per week User Mrs. Dash Walk 15 minutes every day Don't eat past 7 pm

## 2017-11-16 ENCOUNTER — Encounter: Payer: Self-pay | Admitting: Cardiovascular Disease

## 2017-11-16 ENCOUNTER — Ambulatory Visit (INDEPENDENT_AMBULATORY_CARE_PROVIDER_SITE_OTHER): Payer: BLUE CROSS/BLUE SHIELD | Admitting: Cardiovascular Disease

## 2017-11-16 ENCOUNTER — Other Ambulatory Visit (HOSPITAL_COMMUNITY)
Admission: RE | Admit: 2017-11-16 | Discharge: 2017-11-16 | Disposition: A | Discharge status: Home / Self Care | Payer: BLUE CROSS/BLUE SHIELD | Source: Ambulatory Visit | Attending: Cardiovascular Disease | Admitting: Cardiovascular Disease

## 2017-11-16 ENCOUNTER — Encounter

## 2017-11-16 VITALS — BP 120/78 | HR 68 | Wt >= 6400 oz

## 2017-11-16 DIAGNOSIS — R600 Localized edema: Secondary | ICD-10-CM

## 2017-11-16 DIAGNOSIS — Z950 Presence of cardiac pacemaker: Secondary | ICD-10-CM

## 2017-11-16 DIAGNOSIS — I10 Essential (primary) hypertension: Secondary | ICD-10-CM

## 2017-11-16 DIAGNOSIS — Z79899 Other long term (current) drug therapy: Secondary | ICD-10-CM

## 2017-11-16 DIAGNOSIS — Z953 Presence of xenogenic heart valve: Secondary | ICD-10-CM

## 2017-11-16 DIAGNOSIS — I2699 Other pulmonary embolism without acute cor pulmonale: Secondary | ICD-10-CM

## 2017-11-16 LAB — BASIC METABOLIC PANEL
ANION GAP: 9 (ref 5–15)
BUN: 19 mg/dL (ref 6–20)
CALCIUM: 9.2 mg/dL (ref 8.9–10.3)
CO2: 31 mmol/L (ref 22–32)
Chloride: 98 mmol/L (ref 98–111)
Creatinine, Ser: 1.05 mg/dL (ref 0.61–1.24)
GFR calc Af Amer: >60 mL/min (ref >60)
GFR calc non Af Amer: >60 mL/min (ref >60)
GLUCOSE: 108 mg/dL — AB (ref 70–99)
Potassium: 3.7 mmol/L (ref 3.5–5.1)
Sodium: 138 mmol/L (ref 135–145)

## 2017-11-16 MED ORDER — FUROSEMIDE 80 MG PO TABS: ORAL_TABLET | ORAL | 3 refills | Status: DC

## 2017-11-16 MED ORDER — FUROSEMIDE 80 MG PO TABS: 80.0000 mg | ORAL_TABLET | Freq: Every day | ORAL | 3 refills | Status: DC

## 2017-11-16 NOTE — Patient Instructions (Addendum)
Your physician wants you to follow-up in:4 months with [facility]    Your physician recommends that you continue on your current medications as directed. Please refer to the Current Medication list given to you today.   If you need a refill on your cardiac medications before your next appointment, please call your pharmacy.     Get lab work now: BMET     No testing ordered today       Thank you for choosing [facility] !

## 2017-11-16 NOTE — Progress Notes (Signed)
SUBJECTIVE: The patient presents for routine follow-up.  He underwent aortic valve replacement on 08/23/2017 for severe bicuspid aortic valve stenosis with a bovine pericardial tissue aortic valve and thoracic aortic aneurysm repair. He developed postoperative atrial fibrillation and is on Xarelto and metoprolol succinate.  He has been on amiodarone. He also has complete heart block and underwent pacemaker placement by Dr. Rayann Heman on 08/28/2017. CT angiography on 09/18/2017 showed mild to moderate amount of acute bilateral pulmonary emboli greater on the left with evidence of right heart strain.  He denies chest pain.  He has some dizziness.  He denies palpitations.  He has chronic exertional dyspnea.  He has bilateral leg edema and his PCP increase Lasix to 80 mg which he takes every morning and takes an extra 40 mg in the evening as needed.  He said he has not had a blood tests since the increased dose of Lasix.  The last basic metabolic panel I see in the system is dated 09/19/2017 and shows potassium 3.4, BUN 16, creatinine 0.96.  He said he had a blood test about 3 weeks ago at his PCPs office.  He is eager to return to work.    Review of Systems: As per "subjective", otherwise negative.  No Known Allergies  Current Outpatient Medications  Medication Sig Dispense Refill  . acetaminophen (TYLENOL) 325 MG tablet Take 650 mg by mouth every 6 (six) hours as needed for mild pain.    Marland Kitchen albuterol (PROVENTIL HFA;VENTOLIN HFA) 108 (90 Base) MCG/ACT inhaler Inhale 2 puffs into the lungs every 6 (six) hours as needed for wheezing or shortness of breath.     . allopurinol (ZYLOPRIM) 300 MG tablet Take 300 mg by mouth daily.    . chlorhexidine (PERIDEX) 0.12 % solution Rinse with 15 mls twice daily for 30 seconds. Use after breakfast and at bedtime. Spit out excess. Do not swallow. (Patient taking differently: Use as directed 15 mLs in the mouth or throat 2 (two) times daily. Rinse with 15 mls twice  daily for 30 seconds. Use after breakfast and at bedtime. Spit out excess. Do not swallow.) 480 mL prn  . citalopram (CELEXA) 20 MG tablet Take 20 mg by mouth at bedtime.  2  . clonazePAM (KLONOPIN) 1 MG tablet Take 0.5-1 mg by mouth every 8 (eight) hours as needed for anxiety.    . docusate sodium (COLACE) 100 MG capsule Take 200 mg by mouth daily.     . furosemide (LASIX) 40 MG tablet Take 40 mg by mouth. Take 80 mg in The Morning and 40 mg in The Evening.    Marland Kitchen ipratropium (ATROVENT) 0.02 % nebulizer solution Take 0.5 mg by nebulization every 6 (six) hours as needed for wheezing or shortness of breath.    . lisinopril (PRINIVIL,ZESTRIL) 20 MG tablet Take 1 tablet (20 mg total) by mouth daily. 90 tablet 3  . metoprolol succinate (TOPROL-XL) 100 MG 24 hr tablet Take 1 tablet (100 mg total) by mouth daily. Take with or immediately following a meal. 90 tablet 3  . potassium chloride (K-DUR,KLOR-CON) 10 MEQ tablet Take 10 mEq by mouth daily.    . Probiotic Product (PROBIOTIC PO) Take 1 capsule by mouth at bedtime.    . rivaroxaban (XARELTO) 20 MG TABS tablet Take 1 tablet (20 mg total) by mouth daily with supper. 90 tablet 3   No current facility-administered medications for this visit.     Past Medical History:  Diagnosis Date  .  Anxiety   . Aortic stenosis   . Asthma   . Back pain   . Cellulitis of skin with lymphangitis   . CHF (congestive heart failure) ([place])   . Chronic diastolic congestive heart failure ([facility])   . Chronic venous insufficiency   . Constipation   . COPD (chronic obstructive pulmonary disease) ([place])   . Dyspnea   . Essential hypertension   . Gout   . Hypertension   . Morbid obesity ([place])   . Obesity   . Pneumonia    walking pneumonia  . Prostatitis   . Pulmonary hypertension ([place])   . S/P aortic valve replacement with bioprosthetic valve 08/23/2017   25 mm Edwards Inspiris Resilia stented bovine pericardial tissue valve  . 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    24 mm Hemashield supracoronary straight graft   . Sleep apnea    cpap   . Thoracic ascending aortic aneurysm ([place])   . Thoracic ascending aortic aneurysm ([place])   . Tobacco abuse     Past Surgical History:  Procedure Laterality Date  . AORTIC VALVE REPLACEMENT N/A 08/23/2017   Procedure: AORTIC VALVE REPLACEMENT (AVR) USING INSPIRIS RESILIA AORTIC VALVE SIZE 25 MM;  Surgeon: Rexene Alberts, MD;  Location: [facility];  Service: Open Heart Surgery;  Laterality: N/A;  . MULTIPLE EXTRACTIONS WITH ALVEOLOPLASTY N/A 07/23/2017   Procedure: Extraction of tooth #10 with alveoloplasty and gross debridement of remaining teeth;  Surgeon: Lenn Cal, DDS;  Location: [facility];  Service: Oral Surgery;  Laterality: N/A;  . NO PAST SURGERIES    . PACEMAKER IMPLANT N/A 08/28/2017   Procedure: PACEMAKER IMPLANT;  Surgeon: Thompson Grayer, MD;  Location: [facility] CV [facility];  Service: Cardiovascular;  Laterality: N/A;  . TEE WITHOUT CARDIOVERSION N/A 07/16/2017   Procedure: TRANSESOPHAGEAL ECHOCARDIOGRAM (TEE);  Surgeon: Lelon Perla, MD;  Location: [facility] ENDOSCOPY;  Service: Cardiovascular;  Laterality: N/A;  . TEE WITHOUT CARDIOVERSION N/A 08/23/2017   Procedure: TRANSESOPHAGEAL ECHOCARDIOGRAM (TEE);  Surgeon: Rexene Alberts, MD;  Location: [facility];  Service: Open Heart Surgery;  Laterality: N/A;  . THORACIC AORTIC ANEURYSM REPAIR N/A 08/23/2017   Procedure: THORACIC ASCENDING ANEURYSM REPAIR (AAA) USING HEMASHIELD GOLD KNITTED MICROVEL DOUBLE VELOUR VASCULAR GRAFT D: 24 MM  L: 30 CM;  Surgeon: Rexene Alberts, MD;  Location: [facility];  Service: Open Heart Surgery;  Laterality: N/A;    Social History   Socioeconomic History  . Marital status: Single    Spouse name: Not on file  . Number of children: Not on file  . Years of education: Not on file  . Highest education level: Not on file  Occupational History  . Not on file  Social Needs  . Financial resource strain: Not on file  . Food insecurity:    Worry:  Not on file    Inability: Not on file  . Transportation needs:    Medical: Not on file    Non-medical: Not on file  Tobacco Use  . Smoking status: Former Smoker    Packs/day: 0.50    Types: Cigarettes    Last attempt to quit: 08/23/2017    Years since quitting: 0.2  . Smokeless tobacco: Never Used  . Tobacco comment: has not smoked since surgery!!!!!  Substance and Sexual Activity  . Alcohol use: No    Frequency: Never    Comment: have had alcohol in the past, not heavy  . Drug use: No  . Sexual activity: Not on file  Lifestyle  . Physical activity:    Days per week: Not on file    Minutes per session: Not on file  . Stress: Not on file  Relationships  . Social connections:    Talks on phone: Not on file    Gets together: Not on file    Attends religious service: Not on file    Active member of club or organization: Not on file    Attends meetings of clubs or organizations: Not on file    Relationship status: Not on file  . Intimate partner violence:    Fear of current or ex partner: Not on file    Emotionally abused: Not on file    Physically abused: Not on file    Forced sexual activity: Not on file  Other Topics Concern  . Not on file  Social History Narrative  . Not on file     Vitals:   11/16/17 1045  BP: 120/78  Pulse: 68  SpO2: 97%  Weight: (!) 425 lb (192.8 kg)    Wt Readings from Last 3 Encounters:  11/16/17 (!) 425 lb (192.8 kg)  11/14/17 (!) 430 lb (195 kg)  10/08/17 (!) 429 lb (194.6 kg)     PHYSICAL EXAM General: NAD HEENT: Normal. Neck: No JVD, no thyromegaly. Lungs: Clear to auscultation bilaterally with normal respiratory effort. CV: Regular rate and rhythm, normal S1/S2, no N8/G9, 2/6 systolic murmur over right upper sternal border.  Trace bilateral lower extremity edema with stasis dermatitis.     Abdomen: Soft, nontender, obese.  Neurologic: Alert and oriented.  Psych: Normal affect. Skin: Normal. Musculoskeletal: No gross  deformities.    ECG: Reviewed above under Subjective   Labs: Lab Results  Component Value Date/Time   K 3.4 (L) 09/19/2017 06:30 AM   BUN 16 09/19/2017 06:30 AM   CREATININE 0.96 09/19/2017 06:30 AM   CREATININE 0.95 07/05/2017 04:56 PM   ALT 24 09/18/2017 09:13 AM   HGB 10.3 (L) 09/20/2017 02:06 AM     Lipids: No results found for: LDLCALC, LDLDIRECT, CHOL, TRIG, HDL     Echocardiogram: 05/25/2017 Study Conclusions  - Left ventricle: The cavity size was normal. Wall thickness was increased in a pattern of moderate LVH. Systolic function was normal. The estimated ejection fraction was in the range of 60% to 65%. Wall motion was normal; there were no regional wall motion abnormalities. Left ventricular diastolic function parameters were normal. - Aortic valve: There was severe stenosis. Mean gradient (S): 48 mm Hg. Valve area (VTI): 0.99 cm^2. Valve area (Vmax): 0.98 cm^2. Valve area (Vmean): 0.88 cm^2. - Aorta: The visualized portion of the proximal ascending aorts is severely dilated at 5.2 cm. Recommend CTA or MRA to better evaluate. - Technically difficult study, echocontrast was used to enhance visualization.  Cardiac Catheterization: 07/16/2017 1. No angiographic evidence of CAD 2. Severe aortic stenosis by TEE/TTE. Due to turbulent flow in the ascending aorta and movement of the catheters, I could not cross the valve. The patient had a TEE this am that demonstrated severe AS. I did not feel that further attempts at crossing the valve would provide any clinical data that would change his treatment plan, though more attempts increased the risk of procedure complication. 3. Normal right heart pressures     ASSESSMENT AND PLAN: 1.  Aortic stenosis status post bovine pericardial tissue aortic valve replacement on 08/23/2017 with thoracic aortic aneurysm repair: Symptomatically stable and improving.  2.  Paroxysmal atrial fibrillation: Currently  on  Xarelto 20 mg daily and metoprolol succinate.  He follows in the atrial fibrillation clinic.  Symptomatically stable.  He had subtherapeutic INR is while on warfarin.  3.  Complete heart block status post pacemaker placement on 08/28/2017: Symptomatically stable.  Normal device function.  4.  Pulmonary emboli: Anticoagulated with Xarelto 20 mg daily.  CTA performed on 09/18/2017 reviewed above.  He had subtherapeutic INR is on warfarin.  He will need a minimum of 6 months of therapy but may need lifelong therapy if he has atrial fibrillation recurrence.  5.  Hypertension: Blood pressure is controlled.  No changes to therapy.  6.  Bilateral leg edema: Left ventricular systolic and diastolic function are normal.  He takes Lasix 80 mg every morning and 40 mg in the evening as needed as prescribed by his PCP.  He has no refills.  I will provide him with at least a 90-day supply and several refills.  I will check a basic metabolic panel as one has not been checked since the increased dose of Lasix 3 weeks ago.   Disposition: Follow up 4 months    Kate Sable, M.D., F.A.C.C.

## 2017-11-19 ENCOUNTER — Encounter: Payer: Self-pay | Admitting: Internal Medicine

## 2017-11-28 ENCOUNTER — Encounter: Payer: Self-pay | Admitting: Internal Medicine

## 2017-11-28 ENCOUNTER — Ambulatory Visit (INDEPENDENT_AMBULATORY_CARE_PROVIDER_SITE_OTHER): Payer: BLUE CROSS/BLUE SHIELD | Admitting: Internal Medicine

## 2017-11-28 VITALS — BP 144/93 | HR 64 | Ht 71.0 in | Wt >= 6400 oz

## 2017-11-28 DIAGNOSIS — Z950 Presence of cardiac pacemaker: Secondary | ICD-10-CM

## 2017-11-28 DIAGNOSIS — Z953 Presence of xenogenic heart valve: Secondary | ICD-10-CM

## 2017-11-28 DIAGNOSIS — I442 Atrioventricular block, complete: Secondary | ICD-10-CM | POA: Diagnosis not present

## 2017-11-28 MED ORDER — FUROSEMIDE 80 MG PO TABS: ORAL_TABLET | ORAL | 3 refills | Status: DC

## 2017-11-28 NOTE — Progress Notes (Signed)
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  Since his recent PPM implant, the patient reports doing very well.  Today, he denies symptoms of palpitations, chest pain, shortness of breath,  lower extremity edema, dizziness, presyncope, or syncope.  The patient is otherwise without complaint today.   Past Medical History:  Diagnosis Date  . Anxiety   . Aortic stenosis   . Asthma   . Back pain   . Cellulitis of skin with lymphangitis   . CHF (congestive heart failure) ([place])   . Chronic diastolic congestive heart failure ([place])   . Chronic venous insufficiency   . Constipation   . COPD (chronic obstructive pulmonary disease) ([place])   . Dyspnea   . Essential hypertension   . Gout   . Hypertension   . Morbid obesity ([place])   . Obesity   . Pneumonia    walking pneumonia  . Prostatitis   . Pulmonary hypertension ([place])   . S/P aortic valve replacement with bioprosthetic valve 08/23/2017   25 mm Edwards Inspiris Resilia stented bovine pericardial tissue valve  . 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 Sleep apnea    cpap   . Thoracic ascending aortic aneurysm ([place])   . Thoracic ascending aortic aneurysm ([place])   . Tobacco abuse    Past Surgical History:  Procedure Laterality Date  . AORTIC VALVE REPLACEMENT N/A 08/23/2017   Procedure: AORTIC VALVE REPLACEMENT (AVR) USING INSPIRIS RESILIA AORTIC VALVE SIZE 25 MM;  Surgeon: Rexene Alberts, MD;  Location: [facility];  Service: Open Heart Surgery;  Laterality: N/A;  . MULTIPLE EXTRACTIONS WITH ALVEOLOPLASTY N/A 07/23/2017   Procedure: Extraction of tooth #10 with alveoloplasty and gross debridement of remaining teeth;  Surgeon: Lenn Cal, DDS;  Location: [facility];  Service: Oral Surgery;  Laterality: N/A;  . NO PAST SURGERIES    . PACEMAKER IMPLANT N/A  08/28/2017   Procedure: PACEMAKER IMPLANT;  Surgeon: Thompson Grayer, MD;  Location: [facility];  Service: Cardiovascular;  Laterality: N/A;  . TEE WITHOUT CARDIOVERSION N/A 07/16/2017   Procedure: TRANSESOPHAGEAL ECHOCARDIOGRAM (TEE);  Surgeon: Lelon Perla, MD;  Location: [facility] ENDOS[facility];  Service: Cardiovascular;  Laterality: N/A;  . TEE WITHOUT CARDIOVERSION N/A 08/23/2017   Procedure: TRANSESOPHAGEAL ECHOCARDIOGRAM (TEE);  Surgeon: Rexene Alberts, MD;  Location: [facility];  Service: Open Heart Surgery;  Laterality: N/A;  . THORACIC AORTIC ANEURYSM REPAIR N/A 08/23/2017   Procedure: THORACIC ASCENDING ANEURYSM REPAIR (AAA) USING HEMASHIELD GOLD KNITTED MICROVEL DOUBLE VELOUR VASCULAR GRAFT D: 24 MM  L: 30 CM;  Surgeon: Rexene Alberts, MD;  Location: [facility];  Service: Open Heart Surgery;  Laterality: N/A;    ROS- all systems are reviewed and negative except as per HPI above  Current Outpatient Medications  Medication Sig Dispense Refill  . acetaminophen (TYLENOL) 325 MG tablet Take 650 mg by mouth every 6 (six) hours as needed for mild pain.    Marland Kitchen albuterol (PROVENTIL HFA;VENTOLIN HFA) 108 (90 Base) MCG/ACT inhaler Inhale 2 puffs into the lungs every 6 (six) hours as needed for wheezing or shortness of breath.     . allopurinol (ZYLOPRIM) 300 MG tablet Take 300 mg by mouth daily.    . chlorhexidine (PERIDEX) 0.12 % solution Rinse with 15 mls twice daily for 30 seconds. Use  after breakfast and at bedtime. Spit out excess. Do not swallow. (Patient taking differently: Use as directed 15 mLs in the mouth or throat 2 (two) times daily. Rinse with 15 mls twice daily for 30 seconds. Use after breakfast and at bedtime. Spit out excess. Do not swallow.) 480 mL prn  . citalopram (CELEXA) 20 MG tablet Take 20 mg by mouth at bedtime.  2  . clonazePAM (KLONOPIN) 1 MG tablet Take 0.5-1 mg by mouth every 8 (eight) hours as needed for anxiety.    . docusate sodium (COLACE) 100 MG capsule Take 200 mg by mouth  daily.     . furosemide (LASIX) 80 MG tablet Take 80 mg daily, may take an extra 40 mg pm dose as needed for swelling 120 tablet 3  . ipratropium (ATROVENT) 0.02 % nebulizer solution Take 0.5 mg by nebulization every 6 (six) hours as needed for wheezing or shortness of breath.    . lisinopril (PRINIVIL,ZESTRIL) 20 MG tablet Take 1 tablet (20 mg total) by mouth daily. 90 tablet 3  . metoprolol succinate (TOPROL-XL) 100 MG 24 hr tablet Take 1 tablet (100 mg total) by mouth daily. Take with or immediately following a meal. 90 tablet 3  . potassium chloride (K-DUR,KLOR-CON) 10 MEQ tablet Take 10 mEq by mouth daily.    . Probiotic Product (PROBIOTIC PO) Take 1 capsule by mouth at bedtime.    . rivaroxaban (XARELTO) 20 MG TABS tablet Take 1 tablet (20 mg total) by mouth daily with supper. 90 tablet 3   No current facility-administered medications for this visit.     Physical Exam: Vitals:   11/28/17 1628  BP: (!) 144/93  Pulse: 64  Weight: (!) 425 lb (192.8 kg)  Height: 5\' 11"  (1.803 m)    GEN- The patient is well appearing, alert and oriented x 3 today.   Head- normocephalic, atraumatic Eyes-  Sclera clear, conjunctiva pink Ears- hearing intact Oropharynx- clear Lungs- Clear to ausculation bilaterally, normal work of breathing Chest- pacemaker pocket is well healed Heart- Regular rate and rhythm, no murmurs, rubs or gallops, PMI not laterally displaced GI- soft, NT, ND, + BS Extremities- no clubbing, cyanosis, or edema  Pacemaker interrogation- reviewed in detail today,  See PACEART report  ekg tracing ordered today is personally reviewed and shows sinus rhythm 64 bpm, PR 186 msec, QRS 156 msec, RBBB  Assessment and Plan:  1. Symptomatic complete heart block Normal pacemaker function See Pace Art report No changes today  2. Post operative atrial fibrillation Now in sinus rhythm  afib burden is 5 % by device interrogation (no afib since Mary 27) continue anticoagulation with  xarelto (has also had pulmonary embolism)  3. S/p aortic valve surgery (bioprosthetic) with  Thoracic aortic aneurysm repair Doing well post op  4. HTN Stable No change required today  Merlin Return to see me in [place] in a year  Thompson Grayer MD, [facility] 11/28/2017 4:50 PM

## 2017-11-28 NOTE — Patient Instructions (Addendum)
Medication Instructions:  Your physician recommends that you continue on your current medications as directed. Please refer to the Current Medication list given to you today.  Labwork: None ordered.  Testing/Procedures: None ordered.  Follow-Up: Your physician wants you to follow-up in: one year with Dr. Rayann Heman in [place].   You will receive a reminder letter in the mail two months in advance. If you don't receive a letter, please call our office to schedule the follow-up appointment.  Remote monitoring is used to monitor your Pacemaker from home. This monitoring reduces the number of office visits required to check your device to one time per year. It allows [place] to keep an eye on the functioning of your device to ensure it is working properly. You are scheduled for a device check from home on 02/27/2018. You may send your transmission at any time that day. If you have a wireless device, the transmission will be sent automatically. After your physician reviews your transmission, you will receive a postcard with your next transmission date.  Any Other Special Instructions Will Be Listed Below (If Applicable).  If you need a refill on your cardiac medications before your next appointment, please call your pharmacy.

## 2017-12-03 LAB — CUP PACEART INCLINIC DEVICE CHECK
Battery Voltage: 3.04 V
Brady Statistic RA Percent Paced: 5.2 %
Brady Statistic RV Percent Paced: 1.9 %
Date Time Interrogation Session: 20190710204254
Implantable Lead Implant Date: 20190409
Implantable Lead Location: 753859
Implantable Lead Location: 753860
Lead Channel Impedance Value: 612.5 Ohm
Lead Channel Pacing Threshold Amplitude: 0.5 V
Lead Channel Pacing Threshold Pulse Width: 0.5 ms
Lead Channel Setting Pacing Amplitude: 2 V
Lead Channel Setting Pacing Amplitude: 2.5 V
Lead Channel Setting Pacing Pulse Width: 0.5 ms
Lead Channel Setting Sensing Sensitivity: 2 mV
MDC IDC LEAD IMPLANT DT: 20190409
MDC IDC MSMT BATTERY REMAINING LONGEVITY: 141 mo
MDC IDC MSMT LEADCHNL RA IMPEDANCE VALUE: 537.5 Ohm
MDC IDC MSMT LEADCHNL RA PACING THRESHOLD AMPLITUDE: 0.5 V
MDC IDC MSMT LEADCHNL RA PACING THRESHOLD PULSEWIDTH: 0.5 ms
MDC IDC MSMT LEADCHNL RA SENSING INTR AMPL: 4.8 mV
MDC IDC MSMT LEADCHNL RV SENSING INTR AMPL: 6.3 mV
MDC IDC PG IMPLANT DT: 20190409
Pulse Gen Serial Number: 9010865

## 2017-12-10 ENCOUNTER — Ambulatory Visit (INDEPENDENT_AMBULATORY_CARE_PROVIDER_SITE_OTHER): Payer: Self-pay | Admitting: Thoracic Surgery (Cardiothoracic Vascular Surgery)

## 2017-12-10 ENCOUNTER — Encounter: Payer: Self-pay | Admitting: Thoracic Surgery (Cardiothoracic Vascular Surgery)

## 2017-12-10 VITALS — BP 131/72 | HR 64 | Resp 20 | Ht 71.0 in | Wt >= 6400 oz

## 2017-12-10 DIAGNOSIS — I350 Nonrheumatic aortic (valve) stenosis: Secondary | ICD-10-CM

## 2017-12-10 DIAGNOSIS — Z953 Presence of xenogenic heart valve: Secondary | ICD-10-CM

## 2017-12-10 DIAGNOSIS — I7121 Aneurysm of the ascending aorta, without rupture: Secondary | ICD-10-CM

## 2017-12-10 DIAGNOSIS — I712 Thoracic aortic aneurysm, without rupture: Secondary | ICD-10-CM

## 2017-12-10 NOTE — Progress Notes (Signed)
[address]       [place] [address]             [phone]     CARDIOTHORACIC SURGERY OFFICE NOTE  Referring Provider is Herminio Commons, MD PCP is Monico Blitz, MD   HPI:  Patient is a 47 year old morbidly obese male with long-standing history of heart murmur, chronic diastolic congestive heart failure, hypertension, chronic venous insufficiency, obstructive sleep apnea on CPAP, and long-standing tobacco abuse with COPD who returns to the office today for follow-up and wound check status post aortic valve replacement using a bioprosthetic tissue valve with supracoronary straight graft repair of ascending thoracic aortic aneurysm on August 23, 2017 for bicuspid aortic valve with severe symptomatic aortic stenosis and fusiform aneurysmal enlargement of the ascending thoracic aorta.  Postoperatively the patient developed complete heart block requiring permanent pacemaker placement.  He also had postoperative atrial fibrillation for which he was treated with amiodarone, Coumadin, and beta-blocker therapy.  The patient was rehospitalized September 18, 2017 with evidence of deep venous thrombosis, acute exacerbation chronic diastolic congestive heart failure, and pulmonary embolism.  He was still subtherapeutic on warfarin at that time.  Coumadin was stopped and he was switched to Xarelto.  At the time he had some mild drainage from the inferior aspect of his sternal incision which was felt likely to be related to fat necrosis.  He was last seen in our office on Oct 08, 2017 at which time he was making slow but steady progress and his sternal drainage had resolved.  He was seen in follow-up by Dr. Bronson Ing on November 16, 2017 at which time his medications were renewed.  He was seen in follow-up by Dr. Rayann Heman earlier this month at which time normal pacemaker function was noted with primarily sinus rhythm on interrogation of the pacemaker.  He has not had a follow-up echocardiogram  performed since his surgery.  He returns for office today for routine follow-up.  He complains that he still has a tendency to develop fluid overload if he tries to stop diuretics.  He has no exertional chest discomfort.  He does report some mild residual pain in his chest with coughing or sneezing.  He denies any sensation of clicking or motion of the chest.  He has lost his job and recently lost his healthcare insurance.  He wants to go back to work.   Current Outpatient Medications  Medication Sig Dispense Refill  . acetaminophen (TYLENOL) 325 MG tablet Take 650 mg by mouth every 6 (six) hours as needed for mild pain.    Marland Kitchen albuterol (PROVENTIL HFA;VENTOLIN HFA) 108 (90 Base) MCG/ACT inhaler Inhale 2 puffs into the lungs every 6 (six) hours as needed for wheezing or shortness of breath.     . allopurinol (ZYLOPRIM) 300 MG tablet Take 300 mg by mouth daily.    . chlorhexidine (PERIDEX) 0.12 % solution Rinse with 15 mls twice daily for 30 seconds. Use after breakfast and at bedtime. Spit out excess. Do not swallow. (Patient taking differently: Use as directed 15 mLs in the mouth or throat 2 (two) times daily. Rinse with 15 mls twice daily for 30 seconds. Use after breakfast and at bedtime. Spit out excess. Do not swallow.) 480 mL prn  . citalopram (CELEXA) 20 MG tablet Take 20 mg by mouth at bedtime.  2  . clonazePAM (KLONOPIN) 1 MG tablet Take 0.5-1 mg by mouth every 8 (eight) hours as needed for anxiety.    Marland Kitchen  docusate sodium (COLACE) 100 MG capsule Take 200 mg by mouth daily.     . furosemide (LASIX) 80 MG tablet Take 80 mg daily, may take an extra 40 mg pm dose as needed for swelling 120 tablet 3  . ipratropium (ATROVENT) 0.02 % nebulizer solution Take 0.5 mg by nebulization every 6 (six) hours as needed for wheezing or shortness of breath.    . lisinopril (PRINIVIL,ZESTRIL) 20 MG tablet Take 1 tablet (20 mg total) by mouth daily. 90 tablet 3  . metoprolol succinate (TOPROL-XL) 100 MG 24 hr  tablet Take 1 tablet (100 mg total) by mouth daily. Take with or immediately following a meal. 90 tablet 3  . potassium chloride (K-DUR,KLOR-CON) 10 MEQ tablet Take 10 mEq by mouth daily.    . Probiotic Product (PROBIOTIC PO) Take 1 capsule by mouth at bedtime.    . rivaroxaban (XARELTO) 20 MG TABS tablet Take 1 tablet (20 mg total) by mouth daily with supper. 90 tablet 3   No current facility-administered medications for this visit.       Physical Exam:   BP 131/72   Pulse 64   Resp 20   Ht 5\' 11"  (1.803 m)   Wt (!) 423 lb (191.9 kg)   SpO2 98% Comment: RA  BMI 59.00 kg/m   General:  Morbidly obese but well-appearing  Chest:   Clear to auscultation  CV:   Regular rate and rhythm without murmur  Incisions:  Well-healed with stable sternum exam  Abdomen:  Soft nontender  Extremities:  Warm and well-perfused with moderate lower extremity edema  Diagnostic Tests:  n/a   Impression:  Patient continues to improve clinically now more than 3 months status post aortic valve replacement using a bioprosthetic tissue valve with supra coronary straight graft replacement of the ascending thoracic aorta.  He complains of stable chronic symptoms of exertional shortness of breath and lower extremity edema consistent with chronic diastolic congestive heart failure.  Plan:  We have not recommended any change the patient's current medications.  I have reminded the patient regarding the reasons that he has symptoms of chronic diastolic congestive heart failure and the importance that he will require long-term follow-up with a cardiologist and primary care physician for the remainder of his life.  Many benefits of regular exercise and weight loss have been discussed.  I have encouraged the patient to continue to gradually increase his physical activity without any particular limitations at this time.  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  This would need to be arranged through his cardiologist.  At some point it would make sense to obtain a routine follow-up echocardiogram, as to my knowledge she has not had one since his surgery.  Patient will return to our office for routine follow-up next spring, approximately 1 year following his surgery.  He will call and return sooner should specific problems or questions arise.  I spent in excess of 15 minutes during the conduct of this office consultation and >50% of this time involved direct face-to-face encounter with the patient for counseling and/or coordination of their care.    Valentina Gu. Roxy Manns, MD 12/10/2017 3:07 PM

## 2017-12-10 NOTE — Patient Instructions (Signed)
Continue all previous medications without any changes at this time  You may resume unrestricted physical activity without any particular limitations at this time.  Endocarditis is a potentially serious infection of heart valves or inside lining of the heart.  It occurs more commonly in patients with diseased heart valves (such as patient's with aortic or mitral valve disease) and in patients who have undergone heart valve repair or replacement.  Certain surgical and dental procedures may put you at risk, such as dental cleaning, other dental procedures, or any surgery involving the respiratory, urinary, gastrointestinal tract, gallbladder or prostate gland.   To minimize your chances for develooping endocarditis, maintain good oral health and seek prompt medical attention for any infections involving the mouth, teeth, gums, skin or urinary tract.    Always notify your doctor or dentist about your underlying heart valve condition before having any invasive procedures. You will need to take antibiotics before certain procedures, including all routine dental cleanings or other dental procedures.  Your cardiologist or dentist should prescribe these antibiotics for you to be taken ahead of time.  You have been advised of the numerous benefits associated with regular exercise and weight loss.  The long term benefits for your overall health and wellbeing cannot be overestimated.

## 2017-12-17 ENCOUNTER — Ambulatory Visit: Payer: Self-pay | Admitting: Nutrition

## 2018-02-12 DIAGNOSIS — Z736 Limitation of activities due to disability: Secondary | ICD-10-CM

## 2018-02-26 ENCOUNTER — Other Ambulatory Visit: Payer: Self-pay | Admitting: *Deleted

## 2018-02-26 MED ORDER — RIVAROXABAN 20 MG PO TABS: 20.0000 mg | ORAL_TABLET | Freq: Every day | ORAL | 0 refills | Status: DC

## 2018-02-27 ENCOUNTER — Telehealth: Payer: Self-pay

## 2018-02-27 ENCOUNTER — Ambulatory Visit (INDEPENDENT_AMBULATORY_CARE_PROVIDER_SITE_OTHER): Payer: Self-pay | Admitting: *Deleted

## 2018-02-27 DIAGNOSIS — I442 Atrioventricular block, complete: Secondary | ICD-10-CM

## 2018-02-27 LAB — CUP PACEART REMOTE DEVICE CHECK
Battery Remaining Longevity: 125 mo
Battery Voltage: 3.02 V
Brady Statistic AP VP Percent: 1 %
Brady Statistic AS VP Percent: 1 %
Brady Statistic AS VS Percent: 94 %
Brady Statistic RA Percent Paced: 5 %
Brady Statistic RV Percent Paced: 1 %
Date Time Interrogation Session: 20191009070408
Implantable Lead Implant Date: 20190409
Implantable Lead Implant Date: 20190409
Implantable Lead Location: 753859
Implantable Lead Location: 753860
Implantable Pulse Generator Implant Date: 20190409
Lead Channel Impedance Value: 540 Ohm
Lead Channel Impedance Value: 600 Ohm
Lead Channel Pacing Threshold Amplitude: 0.5 V
Lead Channel Pacing Threshold Pulse Width: 0.5 ms
Lead Channel Pacing Threshold Pulse Width: 0.5 ms
Lead Channel Sensing Intrinsic Amplitude: 4.3 mV
Lead Channel Setting Pacing Amplitude: 2 V
Lead Channel Setting Pacing Amplitude: 2.5 V
Lead Channel Setting Pacing Pulse Width: 0.5 ms
Lead Channel Setting Sensing Sensitivity: 2 mV
MDC IDC MSMT BATTERY REMAINING PERCENTAGE: 95.5 %
MDC IDC MSMT LEADCHNL RA SENSING INTR AMPL: 5 mV
MDC IDC MSMT LEADCHNL RV PACING THRESHOLD AMPLITUDE: 0.5 V
MDC IDC STAT BRADY AP VS PERCENT: 5.1 %
Pulse Gen Model: 2272
Pulse Gen Serial Number: 9010865

## 2018-02-27 NOTE — Telephone Encounter (Signed)
Spoke with pt and reminded pt of remote transmission that is due today. Pt verbalized understanding.   

## 2018-02-28 NOTE — Progress Notes (Signed)
Remote pacemaker transmission.   

## 2018-03-21 ENCOUNTER — Ambulatory Visit (INDEPENDENT_AMBULATORY_CARE_PROVIDER_SITE_OTHER): Payer: Self-pay | Admitting: Cardiovascular Disease

## 2018-03-21 ENCOUNTER — Encounter: Payer: Self-pay | Admitting: Cardiovascular Disease

## 2018-03-21 ENCOUNTER — Telehealth: Payer: Self-pay | Admitting: Cardiovascular Disease

## 2018-03-21 ENCOUNTER — Ambulatory Visit (HOSPITAL_COMMUNITY)
Admission: RE | Admit: 2018-03-21 | Discharge: 2018-03-21 | Disposition: A | Discharge status: Home / Self Care | Payer: Self-pay | Source: Ambulatory Visit | Attending: Cardiovascular Disease | Admitting: Cardiovascular Disease

## 2018-03-21 ENCOUNTER — Other Ambulatory Visit (HOSPITAL_COMMUNITY)
Admission: RE | Admit: 2018-03-21 | Discharge: 2018-03-21 | Disposition: A | Discharge status: Home / Self Care | Payer: Self-pay | Source: Ambulatory Visit | Attending: Cardiovascular Disease | Admitting: Cardiovascular Disease

## 2018-03-21 VITALS — BP 142/72 | HR 82 | Ht 71.0 in | Wt >= 6400 oz

## 2018-03-21 DIAGNOSIS — Z86711 Personal history of pulmonary embolism: Secondary | ICD-10-CM | POA: Insufficient documentation

## 2018-03-21 DIAGNOSIS — Z950 Presence of cardiac pacemaker: Secondary | ICD-10-CM | POA: Insufficient documentation

## 2018-03-21 DIAGNOSIS — R0602 Shortness of breath: Secondary | ICD-10-CM

## 2018-03-21 DIAGNOSIS — Z952 Presence of prosthetic heart valve: Secondary | ICD-10-CM

## 2018-03-21 DIAGNOSIS — R599 Enlarged lymph nodes, unspecified: Secondary | ICD-10-CM | POA: Insufficient documentation

## 2018-03-21 DIAGNOSIS — K760 Fatty (change of) liver, not elsewhere classified: Secondary | ICD-10-CM | POA: Insufficient documentation

## 2018-03-21 DIAGNOSIS — Z953 Presence of xenogenic heart valve: Secondary | ICD-10-CM

## 2018-03-21 DIAGNOSIS — I480 Paroxysmal atrial fibrillation: Secondary | ICD-10-CM

## 2018-03-21 DIAGNOSIS — I442 Atrioventricular block, complete: Secondary | ICD-10-CM

## 2018-03-21 LAB — CBC WITH DIFFERENTIAL/PLATELET
Abs Immature Granulocytes: 0.04 10*3/uL (ref 0.00–0.07)
Basophils Absolute: 0 10*3/uL (ref 0.0–0.1)
Basophils Relative: 0 %
Eosinophils Absolute: 0.4 10*3/uL (ref 0.0–0.5)
Eosinophils Relative: 4 %
HEMATOCRIT: 44 % (ref 39.0–52.0)
HEMOGLOBIN: 13.3 g/dL (ref 13.0–17.0)
Immature Granulocytes: 0 %
LYMPHS ABS: 1.6 10*3/uL (ref 0.7–4.0)
LYMPHS PCT: 17 %
MCH: 24.5 pg — AB (ref 26.0–34.0)
MCHC: 30.2 g/dL (ref 30.0–36.0)
MCV: 81.2 fL (ref 80.0–100.0)
MONO ABS: 0.7 10*3/uL (ref 0.1–1.0)
Monocytes Relative: 8 %
NEUTROS ABS: 6.4 10*3/uL (ref 1.7–7.7)
Neutrophils Relative %: 71 %
Platelets: 254 10*3/uL (ref 150–400)
RBC: 5.42 MIL/uL (ref 4.22–5.81)
RDW: 16.1 % — ABNORMAL HIGH (ref 11.5–15.5)
WBC: 9.1 10*3/uL (ref 4.0–10.5)
nRBC: 0 % (ref 0.0–0.2)

## 2018-03-21 LAB — BASIC METABOLIC PANEL
Anion gap: 9 (ref 5–15)
BUN: 15 mg/dL (ref 6–20)
CHLORIDE: 101 mmol/L (ref 98–111)
CO2: 28 mmol/L (ref 22–32)
Calcium: 9 mg/dL (ref 8.9–10.3)
Creatinine, Ser: 0.86 mg/dL (ref 0.61–1.24)
GFR calc Af Amer: >60 mL/min (ref >60)
GFR calc non Af Amer: >60 mL/min (ref >60)
Glucose, Bld: 97 mg/dL (ref 70–99)
POTASSIUM: 3.6 mmol/L (ref 3.5–5.1)
SODIUM: 138 mmol/L (ref 135–145)

## 2018-03-21 LAB — POCT I-STAT CREATININE: Creatinine, Ser: 0.9 mg/dL (ref 0.61–1.24)

## 2018-03-21 LAB — TSH: TSH: 4.621 u[IU]/mL — AB (ref 0.350–4.500)

## 2018-03-21 MED ORDER — IOPAMIDOL (ISOVUE-370) INJECTION 76%
100.0000 mL | Freq: Once | INTRAVENOUS | Status: AC | PRN
  Administered 2018-03-21: 100 mL via INTRAVENOUS

## 2018-03-21 NOTE — Progress Notes (Signed)
SUBJECTIVE: The patient presents for routine follow-up.  He underwent aortic valve replacement on 08/23/2017 for severe bicuspid aortic valve stenosis with a bovine pericardial tissue aortic valve and thoracic aortic aneurysm repair. He developed postoperative atrial fibrillation and is on Xarelto and metoprolol succinate.  He had been on amiodarone. He also has complete heart block and underwent pacemaker placement by Dr. Rayann Heman on 08/28/2017. CT angiography on 09/18/2017 showed mild to moderate amount of acute bilateral pulmonary emboli greater on the left with evidence of right heart strain.  He tells me that he feels no better after valve replacement surgery.  He remains short of breath with exertion.  He was told by CT surgery it was due to his morbid obesity.  If he is unable to urinate he feels chest pressure.  He has chest pains when he takes a deep breath.  He quit smoking at the time of his surgery.  He feels fatigued.  Preoperative TEE on 08/23/2017 demonstrated normal left ventricular systolic and diastolic function, LVEF 60 to 65%.  There was mild tricuspid regurgitation.  He had no acute cardiopulmonary disease by chest x-ray on 10/08/2017.    He has no coronary artery disease by coronary angiography on 07/16/2017.  BUN 19 and creatinine 1.05 on 11/16/2017.    Review of Systems: As per "subjective", otherwise negative.  No Known Allergies  Current Outpatient Medications  Medication Sig Dispense Refill  . acetaminophen (TYLENOL) 325 MG tablet Take 650 mg by mouth every 6 (six) hours as needed for mild pain.    Marland Kitchen albuterol (PROVENTIL HFA;VENTOLIN HFA) 108 (90 Base) MCG/ACT inhaler Inhale 2 puffs into the lungs every 6 (six) hours as needed for wheezing or shortness of breath.     . allopurinol (ZYLOPRIM) 300 MG tablet Take 300 mg by mouth daily.    . citalopram (CELEXA) 20 MG tablet Take 20 mg by mouth at bedtime.  2  . clonazePAM (KLONOPIN) 1 MG tablet Take 0.5-1 mg by mouth  every 8 (eight) hours as needed for anxiety.    . docusate sodium (COLACE) 100 MG capsule Take 200 mg by mouth daily.     . furosemide (LASIX) 80 MG tablet Take 80 mg daily, may take an extra 40 mg pm dose as needed for swelling 120 tablet 3  . ipratropium (ATROVENT) 0.02 % nebulizer solution Take 0.5 mg by nebulization every 6 (six) hours as needed for wheezing or shortness of breath.    . lisinopril (PRINIVIL,ZESTRIL) 20 MG tablet Take 1 tablet (20 mg total) by mouth daily. 90 tablet 3  . metoprolol succinate (TOPROL-XL) 100 MG 24 hr tablet Take 1 tablet (100 mg total) by mouth daily. Take with or immediately following a meal. 90 tablet 3  . potassium chloride (K-DUR,KLOR-CON) 10 MEQ tablet Take 10 mEq by mouth daily.    . Probiotic Product (PROBIOTIC PO) Take 1 capsule by mouth at bedtime.    . rivaroxaban (XARELTO) 20 MG TABS tablet Take 1 tablet (20 mg total) by mouth daily with supper. 35 tablet 0   No current facility-administered medications for this visit.     Past Medical History:  Diagnosis Date  . Anxiety   . Aortic stenosis   . Asthma   . Back pain   . Cellulitis of skin with lymphangitis   . CHF (congestive heart failure) ([place])   . Chronic diastolic congestive heart failure ([place])   . Chronic venous insufficiency   . Constipation   .  COPD (chronic obstructive pulmonary disease) ([place])   . Dyspnea   . Essential hypertension   . Gout   . Hypertension   . Morbid obesity ([place])   . Obesity   . Pneumonia    walking pneumonia  . Prostatitis   . Pulmonary hypertension ([place])   . S/P aortic valve replacement with bioprosthetic valve 08/23/2017   25 mm Edwards Inspiris Resilia stented bovine pericardial tissue valve  . 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 Sleep apnea    cpap   . Thoracic ascending aortic aneurysm ([place])   . Thoracic ascending aortic aneurysm ([place])   . Tobacco abuse     Past Surgical History:    Procedure Laterality Date  . AORTIC VALVE REPLACEMENT N/A 08/23/2017   Procedure: AORTIC VALVE REPLACEMENT (AVR) USING INSPIRIS RESILIA AORTIC VALVE SIZE 25 MM;  Surgeon: Rexene Alberts, MD;  Location: [place] [facility];  Service: Open Heart Surgery;  Laterality: N/A;  . MULTIPLE EXTRACTIONS WITH ALVEOLOPLASTY N/A 07/23/2017   Procedure: Extraction of tooth #10 with alveoloplasty and gross debridement of remaining teeth;  Surgeon: Lenn Cal, DDS;  Location: [facility];  Service: Oral Surgery;  Laterality: N/A;  . NO PAST SURGERIES    . PACEMAKER IMPLANT N/A 08/28/2017    St Jude Medical Assurity MRI conditional  dual-chamber pacemaker for symptomatic complete heart blockby Dr Rayann Heman  . TEE WITHOUT CARDIOVERSION N/A 07/16/2017   Procedure: TRANSESOPHAGEAL ECHOCARDIOGRAM (TEE);  Surgeon: Lelon Perla, MD;  Location: [facility] ENDOSCOPY;  Service: Cardiovascular;  Laterality: N/A;  . TEE WITHOUT CARDIOVERSION N/A 08/23/2017   Procedure: TRANSESOPHAGEAL ECHOCARDIOGRAM (TEE);  Surgeon: Rexene Alberts, MD;  Location: [facility];  Service: Open Heart Surgery;  Laterality: N/A;  . THORACIC AORTIC ANEURYSM REPAIR N/A 08/23/2017   Procedure: THORACIC ASCENDING ANEURYSM REPAIR (AAA) USING HEMASHIELD GOLD KNITTED MICROVEL DOUBLE VELOUR VASCULAR GRAFT D: 24 MM  L: 30 CM;  Surgeon: Rexene Alberts, MD;  Location: [place];  Service: Open Heart Surgery;  Laterality: N/A;    Social History   Socioeconomic History  . Marital status: Single    Spouse name: Not on file  . Number of children: Not on file  . Years of education: Not on file  . Highest education level: Not on file  Occupational History  . Not on file  Social Needs  . Financial resource strain: Not on file  . Food insecurity:    Worry: Not on file    Inability: Not on file  . Transportation needs:    Medical: Not on file    Non-medical: Not on file  Tobacco Use  . Smoking status: Former Smoker    Packs/day: 0.50    Types: Cigarettes    Last attempt to quit:  08/23/2017    Years since quitting: 0.5  . Smokeless tobacco: Never Used  . Tobacco comment: has not smoked since surgery!!!!!  Substance and Sexual Activity  . Alcohol use: No    Frequency: Never    Comment: have had alcohol in the past, not heavy  . Drug use: No  . Sexual activity: Not on file  Lifestyle  . Physical activity:    Days per week: Not on file    Minutes per session: Not on file  . Stress: Not on file  Relationships  . Social connections:    Talks on phone: Not on file    Gets together: Not on file  Attends religious service: Not on file    Active member of club or organization: Not on file    Attends meetings of clubs or organizations: Not on file    Relationship status: Not on file  . Intimate partner violence:    Fear of current or ex partner: Not on file    Emotionally abused: Not on file    Physically abused: Not on file    Forced sexual activity: Not on file  Other Topics Concern  . Not on file  Social History Narrative  . Not on file     Vitals:   03/21/18 1034  BP: (!) 142/72  Pulse: 82  SpO2: 98%  Weight: (!) 473 lb (214.6 kg)  Height: 5\' 11"  (1.803 m)    Wt Readings from Last 3 Encounters:  03/21/18 (!) 473 lb (214.6 kg)  12/10/17 (!) 423 lb (191.9 kg)  11/28/17 (!) 425 lb (192.8 kg)     PHYSICAL EXAM General: NAD HEENT: Normal. Neck: Unable to assess JVP due to body habitus. Lungs: Clear to auscultation bilaterally with normal respiratory effort. CV: Regular rate and rhythm, normal S1/S2, no D5/H2, 2/6 systolic murmur over right upper sternal border.  Trace bilateral lower extremity edema with stasis dermatitis.     Abdomen: Soft, nontender, obese.  Neurologic: Alert and oriented.  Psych: Normal affect. Skin: Normal. Musculoskeletal: No gross deformities.    ECG: Reviewed above under Subjective   Labs: Lab Results  Component Value Date/Time   K 3.7 11/16/2017 12:03 PM   BUN 19 11/16/2017 12:03 PM   CREATININE 1.05  11/16/2017 12:03 PM   CREATININE 0.95 07/05/2017 04:56 PM   ALT 24 09/18/2017 09:13 AM   HGB 10.3 (L) 09/20/2017 02:06 AM     Lipids: No results found for: LDLCALC, LDLDIRECT, CHOL, TRIG, HDL     Echocardiogram: 05/25/2017 Study Conclusions  - Left ventricle: The cavity size was normal. Wall thickness was increased in a pattern of moderate LVH. Systolic function was normal. The estimated ejection fraction was in the range of 60% to 65%. Wall motion was normal; there were no regional wall motion abnormalities. Left ventricular diastolic function parameters were normal. - Aortic valve: There was severe stenosis. Mean gradient (S): 48 mm Hg. Valve area (VTI): 0.99 cm^2. Valve area (Vmax): 0.98 cm^2. Valve area (Vmean): 0.88 cm^2. - Aorta: The visualized portion of the proximal ascending aorts is severely dilated at 5.2 cm. Recommend CTA or MRA to better evaluate. - Technically difficult study, echocontrast was used to enhance visualization.  Cardiac Catheterization: 07/16/2017 1. No angiographic evidence of CAD 2. Severe aortic stenosis by TEE/TTE. Due to turbulent flow in the ascending aorta and movement of the catheters, I could not cross the valve. The patient had a TEE this am that demonstrated severe AS. I did not feel that further attempts at crossing the valve would provide any clinical data that would change his treatment plan, though more attempts increased the risk of procedure complication. 3. Normal right heart pressures      ASSESSMENT AND PLAN:  1.  Aortic stenosis status post bovine pericardial tissue aortic valve replacement on 08/23/2017 with thoracic aortic aneurysm repair: He continues to have exertional dyspnea. I will obtain a follow-up echocardiogram to assess for interval changes in cardiac structure and function and to assess valvular status.  2.  Paroxysmal atrial fibrillation: Currently on Xarelto 20 mg daily and metoprolol  succinate.  He follows in the atrial fibrillation clinic.  Symptomatically stable.  He  had subtherapeutic INR while on warfarin.  3.  Complete heart block status post pacemaker placement on 08/28/2017: Symptomatically stable.  Normal device function.  4.  Pulmonary emboli: Anticoagulated with Xarelto 20 mg daily.  CTA performed on 09/18/2017 reviewed above.  He had subtherapeutic INR while on warfarin.  He will need a minimum of 6 months of therapy but may need lifelong therapy if he has atrial fibrillation recurrence.  Due to his shortness of breath and pleuritic chest pain, I will obtain follow-up CT angiography.  5.  Hypertension: Blood pressure is mildly elevated today.  No changes to therapy.  6.  Bilateral leg edema: Left ventricular systolic and diastolic function are normal.  He takes Lasix 80 mg every morning and 40 mg in the evening as needed as prescribed by his PCP.  7.  Morbid obesity: He has put on 50 pounds since 12/10/2017.  He needs significant weight loss.  Bariatric surgery should be considered.  8.  Exertional dyspnea: This may be related to morbid obesity and consequent restrictive lung disease.  He also has a history of tobacco abuse but quit smoking in April 2019.  I am obtaining a follow-up echocardiogram.     Disposition: Follow up 6 to 8 weeks   Kate Sable, M.D., F.A.C.C.

## 2018-03-21 NOTE — Telephone Encounter (Signed)
°  Precert needed for:  Echo    CT angio of chest   Location: [facility]   Date: Mar 21 2018  & Mar 22, 2018

## 2018-03-21 NOTE — Patient Instructions (Signed)
Medication Instructions:  Your physician recommends that you continue on your current medications as directed. Please refer to the Current Medication list given to you today.   Labwork: Today  bmet Cbc tsh  Testing/Procedures: Your physician has requested that you have an echocardiogram. Echocardiography is a painless test that uses sound waves to create images of your heart. It provides your doctor with information about the size and shape of your heart and how well your heart's chambers and valves are working. This procedure takes approximately one hour. There are no restrictions for this procedure.  Non-Cardiac CT Angiography (CTA), is a special type of CT scan that uses a computer to produce multi-dimensional views of major blood vessels throughout the body. In CT angiography, a contrast material is injected through an IV to help visualize the blood vessels    Follow-Up: Your physician recommends that you schedule a follow-up appointment in: 6-8 weeks    Any Other Special Instructions Will Be Listed Below (If Applicable).     If you need a refill on your cardiac medications before your next appointment, please call your pharmacy.

## 2018-03-22 ENCOUNTER — Ambulatory Visit (HOSPITAL_COMMUNITY)
Admission: RE | Admit: 2018-03-22 | Discharge: 2018-03-22 | Disposition: A | Discharge status: Home / Self Care | Payer: Self-pay | Source: Ambulatory Visit | Attending: Hematology | Admitting: Hematology

## 2018-03-22 DIAGNOSIS — Z952 Presence of prosthetic heart valve: Secondary | ICD-10-CM | POA: Insufficient documentation

## 2018-03-22 MED ORDER — PERFLUTREN LIPID MICROSPHERE
1.0000 mL | INTRAVENOUS | Status: AC | PRN
  Administered 2018-03-22 (×2): 2 mL via INTRAVENOUS

## 2018-03-22 NOTE — Progress Notes (Signed)
*  PRELIMINARY RESULTS* Echocardiogram 2D Echocardiogram has been performed.  Leavy Cella 03/22/2018, 3:32 PM

## 2018-03-22 NOTE — OR Nursing (Signed)
Called to Echo to start an IV on patient for Definity administration. 50 gauge started in left AC X 1 attempt. Patient tolerated without difficulty.

## 2018-04-22 ENCOUNTER — Other Ambulatory Visit: Payer: Self-pay

## 2018-04-22 ENCOUNTER — Emergency Department (HOSPITAL_COMMUNITY): Payer: Self-pay

## 2018-04-22 ENCOUNTER — Observation Stay (HOSPITAL_COMMUNITY)
Admission: EM | Admit: 2018-04-22 | Discharge: 2018-04-23 | Disposition: A | Discharge status: Home / Self Care | Payer: Self-pay | Attending: Emergency Medicine | Admitting: Emergency Medicine

## 2018-04-22 DIAGNOSIS — K219 Gastro-esophageal reflux disease without esophagitis: Secondary | ICD-10-CM

## 2018-04-22 DIAGNOSIS — R0600 Dyspnea, unspecified: Secondary | ICD-10-CM

## 2018-04-22 DIAGNOSIS — R071 Chest pain on breathing: Secondary | ICD-10-CM

## 2018-04-22 DIAGNOSIS — R079 Chest pain, unspecified: Secondary | ICD-10-CM

## 2018-04-22 DIAGNOSIS — Z950 Presence of cardiac pacemaker: Secondary | ICD-10-CM | POA: Insufficient documentation

## 2018-04-22 DIAGNOSIS — Z87891 Personal history of nicotine dependence: Secondary | ICD-10-CM | POA: Insufficient documentation

## 2018-04-22 DIAGNOSIS — Z952 Presence of prosthetic heart valve: Secondary | ICD-10-CM | POA: Insufficient documentation

## 2018-04-22 DIAGNOSIS — I110 Hypertensive heart disease with heart failure: Secondary | ICD-10-CM | POA: Insufficient documentation

## 2018-04-22 DIAGNOSIS — E669 Obesity, unspecified: Principal | ICD-10-CM | POA: Insufficient documentation

## 2018-04-22 DIAGNOSIS — Z8679 Personal history of other diseases of the circulatory system: Secondary | ICD-10-CM | POA: Insufficient documentation

## 2018-04-22 DIAGNOSIS — I5032 Chronic diastolic (congestive) heart failure: Secondary | ICD-10-CM | POA: Insufficient documentation

## 2018-04-22 DIAGNOSIS — Z86711 Personal history of pulmonary embolism: Secondary | ICD-10-CM | POA: Insufficient documentation

## 2018-04-22 DIAGNOSIS — J45909 Unspecified asthma, uncomplicated: Secondary | ICD-10-CM | POA: Insufficient documentation

## 2018-04-22 LAB — COMPREHENSIVE METABOLIC PANEL
ALT: 46 U/L — AB (ref 0–44)
AST: 50 U/L — ABNORMAL HIGH (ref 15–41)
Albumin: 3.8 g/dL (ref 3.5–5.0)
Alkaline Phosphatase: 71 U/L (ref 38–126)
Anion gap: 9 (ref 5–15)
BUN: 16 mg/dL (ref 6–20)
CHLORIDE: 102 mmol/L (ref 98–111)
CO2: 26 mmol/L (ref 22–32)
CREATININE: 1 mg/dL (ref 0.61–1.24)
Calcium: 8.7 mg/dL — ABNORMAL LOW (ref 8.9–10.3)
Glucose, Bld: 97 mg/dL (ref 70–99)
Potassium: 3.9 mmol/L (ref 3.5–5.1)
Sodium: 137 mmol/L (ref 135–145)
Total Bilirubin: 0.7 mg/dL (ref 0.3–1.2)
Total Protein: 8.3 g/dL — ABNORMAL HIGH (ref 6.5–8.1)

## 2018-04-22 LAB — CBC WITH DIFFERENTIAL/PLATELET
ABS IMMATURE GRANULOCYTES: 0.08 10*3/uL — AB (ref 0.00–0.07)
BASOS PCT: 0 %
Basophils Absolute: 0 10*3/uL (ref 0.0–0.1)
EOS ABS: 0.3 10*3/uL (ref 0.0–0.5)
EOS PCT: 3 %
HCT: 44.7 % (ref 39.0–52.0)
Hemoglobin: 13.4 g/dL (ref 13.0–17.0)
Immature Granulocytes: 1 %
Lymphocytes Relative: 17 %
Lymphs Abs: 1.8 10*3/uL (ref 0.7–4.0)
MCH: 24.4 pg — AB (ref 26.0–34.0)
MCHC: 30 g/dL (ref 30.0–36.0)
MCV: 81.3 fL (ref 80.0–100.0)
MONO ABS: 0.8 10*3/uL (ref 0.1–1.0)
MONOS PCT: 7 %
Neutro Abs: 7.5 10*3/uL (ref 1.7–7.7)
Neutrophils Relative %: 72 %
Platelets: 286 10*3/uL (ref 150–400)
RBC: 5.5 MIL/uL (ref 4.22–5.81)
RDW: 16.1 % — AB (ref 11.5–15.5)
WBC: 10.4 10*3/uL (ref 4.0–10.5)
nRBC: 0 % (ref 0.0–0.2)

## 2018-04-22 LAB — TSH: TSH: 4.717 u[IU]/mL — AB (ref 0.350–4.500)

## 2018-04-22 LAB — BRAIN NATRIURETIC PEPTIDE: B NATRIURETIC PEPTIDE 5: 89 pg/mL (ref 0.0–100.0)

## 2018-04-22 LAB — LIPASE, BLOOD: Lipase: 24 U/L (ref 11–51)

## 2018-04-22 LAB — D-DIMER, QUANTITATIVE: D-Dimer, Quant: 0.53 ug/mL-FEU — ABNORMAL HIGH (ref 0.00–0.50)

## 2018-04-22 LAB — TROPONIN I

## 2018-04-22 MED ORDER — RIVAROXABAN 20 MG PO TABS: 20.0000 mg | ORAL_TABLET | Freq: Every day | ORAL | Status: DC

## 2018-04-22 MED ORDER — NITROGLYCERIN 0.4 MG SL SUBL: 0.4000 mg | SUBLINGUAL_TABLET | SUBLINGUAL | Status: DC | PRN

## 2018-04-22 MED ORDER — ALBUTEROL (5 MG/ML) CONTINUOUS INHALATION SOLN
10.0000 mg/h | INHALATION_SOLUTION | RESPIRATORY_TRACT | Status: DC
  Administered 2018-04-22: 10 mg/h via RESPIRATORY_TRACT
  Filled 2018-04-22: qty 20

## 2018-04-22 MED ORDER — TRAMADOL HCL 50 MG PO TABS
100.0000 mg | ORAL_TABLET | Freq: Once | ORAL | Status: AC
  Administered 2018-04-23: 100 mg via ORAL
  Filled 2018-04-22: qty 2

## 2018-04-22 MED ORDER — FENTANYL CITRATE (PF) 100 MCG/2ML IJ SOLN
50.0000 ug | Freq: Once | INTRAMUSCULAR | Status: AC
  Administered 2018-04-22: 50 ug via INTRAVENOUS
  Filled 2018-04-22: qty 2

## 2018-04-22 MED ORDER — LACTATED RINGERS IV BOLUS
500.0000 mL | Freq: Once | INTRAVENOUS | Status: AC
  Administered 2018-04-22: 500 mL via INTRAVENOUS

## 2018-04-22 MED ORDER — IOPAMIDOL (ISOVUE-370) INJECTION 76%
100.0000 mL | Freq: Once | INTRAVENOUS | Status: AC | PRN
  Administered 2018-04-22: 100 mL via INTRAVENOUS

## 2018-04-22 MED ORDER — IPRATROPIUM BROMIDE 0.02 % IN SOLN
1.0000 mg | Freq: Once | RESPIRATORY_TRACT | Status: AC
  Administered 2018-04-22: 1 mg via RESPIRATORY_TRACT
  Filled 2018-04-22: qty 5

## 2018-04-22 MED ORDER — PREDNISONE 50 MG PO TABS
60.0000 mg | ORAL_TABLET | Freq: Once | ORAL | Status: AC
  Administered 2018-04-22: 60 mg via ORAL
  Filled 2018-04-22: qty 1

## 2018-04-22 NOTE — ED Notes (Signed)
Ambulated pt with 02 on finger level started at 95%,walked a full circle around nursing station 02 dropped down to 93% during walk, and was back up to 95% when we entered room.

## 2018-04-22 NOTE — ED Provider Notes (Signed)
Emergency Department Provider Note   I have reviewed the triage vital signs and the nursing notes.   HISTORY  Chief Complaint Chest Pain   HPI Reginald [name] is a 47 y.o. male with multiple medical problems as documented below the presents to the emergency department today with chest pain.  He has a history of pulmonary embolus on Xarelto.  Also has a history of CHF status post pacemaker.  Also with a history of COPD.  Also has morbid obesity.  He states that the last few days of progressively worsening shortness of breath with pleuritic chest pain.  He had some cough is nonproductive.  No fevers.  When asked where the pain is he points to his left lower chest and states is worse with taking deep breaths.  No rashes.  Has some swelling in his legs but is no worse than his normal.  Has been compliant with medications. No other associated or modifying symptoms.    Past Medical History:  Diagnosis Date  . Anxiety   . Aortic stenosis   . Asthma   . Back pain   . Cellulitis of skin with lymphangitis   . CHF (congestive heart failure) ([place])   . Chronic diastolic congestive heart failure ([place])   . Chronic venous insufficiency   . Constipation   . COPD (chronic obstructive pulmonary disease) ([place])   . Dyspnea   . Essential hypertension   . Gout   . Hypertension   . Morbid obesity ([place])   . Obesity   . Pneumonia    walking pneumonia  . Prostatitis   . Pulmonary hypertension ([place])   . S/P aortic valve replacement with bioprosthetic valve 08/23/2017   25 mm Edwards Inspiris Resilia stented bovine pericardial tissue valve  . 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 Sleep apnea    cpap   . Thoracic ascending aortic aneurysm ([place])   . Thoracic ascending aortic aneurysm ([place])   . Tobacco abuse     Patient Active Problem List   Diagnosis Date Noted  . Pulmonary embolism ([place]) 09/18/2017  . Fever 09/18/2017  . Encounter for  therapeutic drug monitoring 09/04/2017  . 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 08/23/2017  . 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 Pulmonary hypertension ([place])   . Chronic periodontitis 07/18/2017  . Morbid obesity ([place])   . Essential hypertension   . Tobacco abuse   . Chronic diastolic congestive heart failure ([place])   . Chronic venous insufficiency     Past Surgical History:  Procedure Laterality Date  . AORTIC VALVE REPLACEMENT N/A 08/23/2017   Procedure: AORTIC VALVE REPLACEMENT (AVR) USING INSPIRIS RESILIA AORTIC VALVE SIZE 25 MM;  Surgeon: Rexene Alberts, MD;  Location: [facility];  Service: Open Heart Surgery;  Laterality: N/A;  . MULTIPLE EXTRACTIONS WITH ALVEOLOPLASTY N/A 07/23/2017   Procedure: Extraction of tooth #10 with alveoloplasty and gross debridement of remaining teeth;  Surgeon: Lenn Cal, DDS;  Location: [facility];  Service: Oral Surgery;  Laterality: N/A;  . NO PAST SURGERIES    . PACEMAKER IMPLANT N/A 08/28/2017    St Jude Medical Assurity MRI conditional  dual-chamber pacemaker for symptomatic complete heart blockby Dr Rayann Heman  . TEE WITHOUT CARDIOVERSION N/A 07/16/2017   Procedure: TRANSESOPHAGEAL ECHOCARDIOGRAM (TEE);  Surgeon: Lelon Perla, MD;  Location[facility] ENDOSCOPY;  Service: Cardiovascular;  Laterality: N/A;  . TEE WITHOUT  CARDIOVERSION N/A 08/23/2017   Procedure: TRANSESOPHAGEAL ECHOCARDIOGRAM (TEE);  Surgeon: Rexene Alberts, MD;  Location: [facility];  Service: Open Heart Surgery;  Laterality: N/A;  . THORACIC AORTIC ANEURYSM REPAIR N/A 08/23/2017   Procedure: THORACIC ASCENDING ANEURYSM REPAIR (AAA) USING HEMASHIELD GOLD KNITTED MICROVEL DOUBLE VELOUR VASCULAR GRAFT D: 24 MM  L: 30 CM;  Surgeon: Rexene Alberts, MD;  Location: [place];  Service: Open Heart Surgery;  Laterality: N/A;    Current Outpatient Rx  . Order #: [id number] Class: Historical Med  . Order #: [id number] Class: Historical Med    . Order #: [id number] Class: Historical Med  . Order #: [id number] Class: Historical Med  . Order #: [id number] Class: Historical Med  . Order #: [id number] Class: Historical Med  . Order #: [id number] Class: Normal  . Order #: [id number] Class: Historical Med  . Order #: [id number] Class: Normal  . Order #: [id number] Class: Historical Med  . Order #: [id number] Class: Sample  . Order #: [id number] Class: Normal    Allergies Patient has no known allergies.  Family History  Problem Relation Age of Onset  . Hypertension Mother   . Alzheimer's disease Mother   . Heart attack Brother   . Diabetes Brother     Social History Social History   Tobacco Use  . Smoking status: Former Smoker    Packs/day: 0.50    Types: Cigarettes    Last attempt to quit: 08/23/2017    Years since quitting: 0.6  . Smokeless tobacco: Never Used  . Tobacco comment: has not smoked since surgery!!!!!  Substance Use Topics  . Alcohol use: No    Frequency: Never    Comment: have had alcohol in the past, not heavy  . Drug use: No    Review of Systems  All other systems negative except as documented in the HPI. All pertinent positives and negatives as reviewed in the HPI. ____________________________________________   PHYSICAL EXAM:  VITAL SIGNS: ED Triage Vitals [04/22/18 1215]  Enc Vitals Group     BP (!) 151/83     Pulse Rate 84     Resp (!) 22     Temp 98.1 F (36.7 C)     Temp Source Oral     SpO2 100 %     Weight (!) 470 lb (213.2 kg)     Height 5\' 11"  (1.803 m)    Constitutional: Alert and oriented. Well appearing and in no acute distress. Eyes: Conjunctivae are normal. PERRL. EOMI. Head: Atraumatic. Nose: No congestion/rhinnorhea. Mouth/Throat: Mucous membranes are moist.  Oropharynx non-erythematous. Neck: No stridor.  No meningeal signs.   Cardiovascular: Normal rate, regular rhythm. Good peripheral circulation. Grossly normal heart sounds.   Respiratory: Normal respiratory effort.  No  retractions. Lungs diminished bilaterally. Gastrointestinal: Soft and nontender. No distention.  Musculoskeletal: No lower extremity tenderness nor edema. No gross deformities of extremities. Neurologic:  Normal speech and language. No gross focal neurologic deficits are appreciated.  Skin:  Skin is warm, dry and intact. No rash noted.   ____________________________________________   LABS (all labs ordered are listed, but only abnormal results are displayed)  Labs Reviewed  CBC WITH DIFFERENTIAL/PLATELET - Abnormal; Notable for the following components:      Result Value   MCH 24.4 (*)    RDW 16.1 (*)    Abs Immature Granulocytes 0.08 (*)    All other components within normal limits  COMPREHENSIVE METABOLIC PANEL - Abnormal; Notable for the following components:   Calcium 8.7 (*)    Total Protein 8.3 (*)  AST 50 (*)    ALT 46 (*)    All other components within normal limits  D-DIMER, QUANTITATIVE (NOT AT [facility]) - Abnormal; Notable for the following components:   D-Dimer, Quant 0.53 (*)    All other components within normal limits  TSH - Abnormal; Notable for the following components:   TSH 4.717 (*)    All other components within normal limits  TROPONIN I  BRAIN NATRIURETIC PEPTIDE  LIPASE, BLOOD   ____________________________________________  EKG   EKG Interpretation  Date/Time:  Monday April 22 2018 12:19:08 EST Ventricular Rate:  81 PR Interval:  168 QRS Duration: 152 QT Interval:  420 QTC Calculation: 487 R Axis:   148 Text Interpretation:  Normal sinus rhythm Right bundle branch block Abnormal ECG similar to previous given limitations of wander on this ECG Confirmed by Merrily Pew [phone]) on 04/22/2018 12:45:49 PM       ____________________________________________  RADIOLOGY  Dg Chest 2 View  Result Date: 04/22/2018 CLINICAL DATA:  Chest pain. EXAM: CHEST - 2 VIEW COMPARISON:  Oct 08, 2017 FINDINGS: Stable pacemaker. The heart, hila, mediastinum,  lungs, and pleura are unremarkable. IMPRESSION: No active cardiopulmonary disease. Electronically Signed   By: Dorise Bullion III M.D   On: 04/22/2018 15:24   Ct Angio Chest Pe W And/or Wo Contrast  Result Date: 04/22/2018 CLINICAL DATA:  Chest pain for a few days.  Shortness of breath. EXAM: CT ANGIOGRAPHY CHEST WITH CONTRAST TECHNIQUE: Multidetector CT imaging of the chest was performed using the standard protocol during bolus administration of intravenous contrast. Multiplanar CT image reconstructions and MIPs were obtained to evaluate the vascular anatomy. CONTRAST:  110mL ISOVUE-370 IOPAMIDOL (ISOVUE-370) INJECTION 76% COMPARISON:  CT scan March 21, 2018. Chest x-ray April 22, 2018. FINDINGS: Cardiovascular: Proximal aortic arch measures 4 cm today, unchanged. No other aneurysm. No dissection. Mild atherosclerotic changes. Cardiomegaly. Stable pacemaker terminating in the right atrium and right ventricle. Evaluation for pulmonary emboli is limited due to respiratory motion and timing of contrast. The images are also noisy due to patient body habitus. No emboli in the main pulmonary arteries. Evaluation beyond the main pulmonary arteries is limited due to respiratory motion, stairstep artifact, an timing of contrast. Within these limitations, no convincing emboli are noted. Mediastinum/Nodes: The thyroid is normal. The esophagus is normal. No effusions. Continued shotty nodes in the mediastinum, likely reactive. No gross adenopathy. Lungs/Pleura: Central airways are normal. No pneumothorax. No nodules, masses, or focal infiltrates to suggest pneumonia or aspiration. No overt edema. Mild pulmonary venous congestion not excluded. Upper Abdomen: No acute abnormality. Musculoskeletal: No chest wall abnormality. No acute or significant osseous findings. Review of the MIP images confirms the above findings. IMPRESSION: 1. 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 2. No emboli in the  main pulmonary arteries. Evaluation beyond the main pulmonary arteries is significantly degraded by artifact and timing of contrast. No convincing emboli noted. 3. Mild pulmonary venous congestion and cardiomegaly. Electronically Signed   By: Dorise Bullion III M.D   On: 04/22/2018 18:16    ____________________________________________   INITIAL IMPRESSION / ASSESSMENT AND PLAN / ED COURSE  We will check labs for coronary disease, heart failure, pulmonary embolus.  This is significantly diminished bilateral I suspect most was probably from COPD so we will give some breathing treatments as well.  Work-up unremarkable basically but with improved aeration.  Still some chest pain that is intermittent.  Still tachypnea to the point he does not speak in  full sentences.  Will add on prednisone and admit to hospitalist for further management and workup.   Pertinent labs & imaging results that were available during my care of the patient were reviewed by me and considered in my medical decision making (see chart for details).  ____________________________________________  FINAL CLINICAL IMPRESSION(S) / ED DIAGNOSES  Final diagnoses:  Chest pain, unspecified type  Dyspnea, unspecified type     MEDICATIONS GIVEN DURING THIS VISIT:  Medications  albuterol (PROVENTIL,VENTOLIN) solution continuous neb (10 mg/hr Nebulization New Bag/Given 04/22/18 1518)  nitroGLYCERIN (NITROSTAT) SL tablet 0.4 mg (has no administration in time range)  ipratropium (ATROVENT) nebulizer solution 1 mg (1 mg Nebulization Given 04/22/18 1518)  fentaNYL (SUBLIMAZE) injection 50 mcg (50 mcg Intravenous Given 04/22/18 1458)  lactated ringers bolus 500 mL ( Intravenous Stopped 04/22/18 1635)  iopamidol (ISOVUE-370) 76 % injection 100 mL (100 mLs Intravenous Contrast Given 04/22/18 1739)  predniSONE (DELTASONE) tablet 60 mg (60 mg Oral Given 04/22/18 2208)     NEW OUTPATIENT MEDICATIONS STARTED DURING THIS VISIT:  New  Prescriptions   No medications on file    Note:  This note was prepared with assistance of Dragon voice recognition software. Occasional wrong-word or sound-a-like substitutions may have occurred due to the inherent limitations of voice recognition software.   Merrily Pew, MD 04/22/18 2211

## 2018-04-22 NOTE — ED Triage Notes (Signed)
Constant Chest pain x a few days, worse with deep breaths, hurting through to his upper back. SOB worse with activity

## 2018-04-22 NOTE — ED Notes (Signed)
Patient transported to X-ray 

## 2018-04-22 NOTE — H&P (Addendum)
History and Physical    Reginald [name] ZOX:[id number] DOB: [birth year] DOA: 04/22/2018  PCP: Monico Blitz, MD   Patient coming from: Home  Chief Complaint: Chest Pain, SOB  HPI: Reginald [name] is a 47 y.o. male with medical history significant for Morbid Obesity, HTN, aortic valve replacement, diastolic CHF, presented to the ED with complaints of constant "dull" left sided chest pain over the past few days worse with deep breathing.  He denies difficulty breathing at rest, and is present only when he walks around.  Denies cough.  No wheezing.  Denies falls.  Nothing has helped with the pain.  Quit smoking cigarettes 8 months ago, April 2019.  Previously smoked 1 pack/day.  Reports compliance daily with his Xarelto.  No change in lower extremity swelling.  ED Course: Stable vitals,O2 sats 95% at the least with ambulation on room air.  Unremarkable CBC BMP.  Troponin less than 0.03.  BNP improved compared to prior at 89.  Two-view chest x-ray-acute abnormality.  Minimally elevated d-dimer 0.53 subsequent CTA chest-no pulmonary embolism in the main arteries but evaluation degraded by artifacts and timing of contrast, mild pulmonary venous congestion and cardiomegaly.  512ml bolus given. Patient was given breathing treatments in the ED, with wheezing subsequently noted.  Prednisone 601 given.  With continued increased work of breathing, hospitalist to admit for dyspnea.  Review of Systems: As per HPI all other systems reviewed and negative  Past Medical History:  Diagnosis Date  . Anxiety   . Aortic stenosis   . Asthma   . Back pain   . Cellulitis of skin with lymphangitis   . CHF (congestive heart failure) ([place])   . Chronic diastolic congestive heart failure ([place])   . Chronic venous insufficiency   . Constipation   . COPD (chronic obstructive pulmonary disease) ([place])   . Dyspnea   . Essential hypertension   . Gout   . Hypertension   . Morbid obesity ([place])   . Obesity   . Pneumonia    walking pneumonia  . Prostatitis   . Pulmonary hypertension ([place])   . S/P aortic valve replacement with bioprosthetic valve 08/23/2017   25 mm Edwards Inspiris Resilia stented bovine pericardial tissue valve  . 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 Sleep apnea    cpap   . Thoracic ascending aortic aneurysm ([place])   . Thoracic ascending aortic aneurysm ([place])   . Tobacco abuse     Past Surgical History:  Procedure Laterality Date  . AORTIC VALVE REPLACEMENT N/A 08/23/2017   Procedure: AORTIC VALVE REPLACEMENT (AVR) USING INSPIRIS RESILIA AORTIC VALVE SIZE 25 MM;  Surgeon: Rexene Alberts, MD;  Location: [place] [facility];  Service: Open Heart Surgery;  Laterality: N/A;  . MULTIPLE EXTRACTIONS WITH ALVEOLOPLASTY N/A 07/23/2017   Procedure: Extraction of tooth #10 with alveoloplasty and gross debridement of remaining teeth;  Surgeon: Lenn Cal, DDS;  Location: [place];  Service: Oral Surgery;  Laterality: N/A;  . NO PAST SURGERIES    . PACEMAKER IMPLANT N/A 08/28/2017    St Jude Medical Assurity MRI conditional  dual-chamber pacemaker for symptomatic complete heart blockby Dr Rayann Heman  . TEE WITHOUT CARDIOVERSION N/A 07/16/2017   Procedure: TRANSESOPHAGEAL ECHOCARDIOGRAM (TEE);  Surgeon: Lelon Perla, MD;  Location: [facility] ENDOSCOPY;  Service: Cardiovascular;  Laterality: N/A;  . TEE WITHOUT CARDIOVERSION N/A 08/23/2017   Procedure: TRANSESOPHAGEAL ECHOCARDIOGRAM (TEE);  Surgeon: Rexene Alberts, MD;  Location:  [place] OR;  Service: Open Heart Surgery;  Laterality: N/A;  . THORACIC AORTIC ANEURYSM REPAIR N/A 08/23/2017   Procedure: THORACIC ASCENDING ANEURYSM REPAIR (AAA) USING HEMASHIELD GOLD KNITTED MICROVEL DOUBLE VELOUR VASCULAR GRAFT D: 24 MM  L: 30 CM;  Surgeon: Rexene Alberts, MD;  Location: [place];  Service: Open Heart Surgery;  Laterality: N/A;     reports that he quit smoking about 7 months ago. His smoking use included cigarettes. He smoked 0.50  packs per day. He has never used smokeless tobacco. He reports that he does not drink alcohol or use drugs.  No Known Allergies  Family History  Problem Relation Age of Onset  . Hypertension Mother   . Alzheimer's disease Mother   . Heart attack Brother   . Diabetes Brother     Prior to Admission medications   Medication Sig Start Date End Date Taking? Authorizing Provider  acetaminophen (TYLENOL) 325 MG tablet Take 650 mg by mouth every 6 (six) hours as needed for mild pain.   Yes [provider]  albuterol (PROVENTIL HFA;VENTOLIN HFA) 108 (90 Base) MCG/ACT inhaler Inhale 2 puffs into the lungs every 6 (six) hours as needed for wheezing or shortness of breath.    Yes [provider]  allopurinol (ZYLOPRIM) 300 MG tablet Take 300 mg by mouth daily.   Yes [provider]  citalopram (CELEXA) 20 MG tablet Take 20 mg by mouth at bedtime. 08/12/17  Yes [provider]  clonazePAM (KLONOPIN) 1 MG tablet Take 0.5-1 mg by mouth every 8 (eight) hours as needed for anxiety.   Yes [provider]  docusate sodium (COLACE) 100 MG capsule Take 200 mg by mouth daily as needed for mild constipation.    Yes [provider]  furosemide (LASIX) 80 MG tablet Take 80 mg daily, may take an extra 40 mg pm dose as needed for swelling Patient taking differently: Take 80 mg by mouth daily. Take 80 mg daily, may take an extra 40 mg pm dose as needed for swelling 11/28/17  Yes Allred, Jeneen Rinks, MD  ipratropium (ATROVENT) 0.02 % nebulizer solution Take 0.5 mg by nebulization every 6 (six) hours as needed for wheezing or shortness of breath.   Yes [provider]  metoprolol succinate (TOPROL-XL) 100 MG 24 hr tablet Take 1 tablet (100 mg total) by mouth daily. Take with or immediately following a meal. 09/21/17  Yes Strader, [place] M, PA-C  potassium chloride (K-DUR,KLOR-CON) 10 MEQ tablet Take 10 mEq by mouth daily.   Yes [provider]  rivaroxaban  (XARELTO) 20 MG TABS tablet Take 1 tablet (20 mg total) by mouth daily with supper. 02/26/18  Yes Herminio Commons, MD  lisinopril (PRINIVIL,ZESTRIL) 20 MG tablet Take 1 tablet (20 mg total) by mouth daily. 09/21/17 03/21/18  Erma Heritage, PA-C    Physical Exam: Vitals:   04/22/18 1830 04/22/18 1909 04/22/18 1930 04/22/18 2145  BP: 122/74  124/72   Pulse: 82 78 75 73  Resp: 16 19 (!) 25 (!) 22  Temp:      TempSrc:      SpO2: 97% 98% 98% 95%  Weight:      Height:        Constitutional: NAD, calm, comfortable, obese Vitals:   04/22/18 1830 04/22/18 1909 04/22/18 1930 04/22/18 2145  BP: 122/74  124/72   Pulse: 82 78 75 73  Resp: 16 19 (!) 25 (!) 22  Temp:      TempSrc:  SpO2: 97% 98% 98% 95%  Weight:      Height:       Eyes: PERRL, lids and conjunctivae normal ENMT: Mucous membranes are moist. Posterior pharynx clear of any exudate or lesions.  Neck: normal, supple, no masses, no thyromegaly Respiratory: clear to auscultation bilaterally, no wheezing, no crackles. Normal respiratory effort. No accessory muscle use.  Cardiovascular: Regular rate and rhythm, 3/6 systolic murmur.  Morbidly obese but no demonstrable pitting edema.  Extremities warm and well perfused..  Point tenderness to palpation to left costovertebral angle, also point tenderness to tip of left scapula anteriorly. Abdomen: Morbidly obese, no tenderness, no masses palpated. No hepatosplenomegaly. Bowel sounds positive.  Musculoskeletal: no clubbing / cyanosis. No joint deformity upper and lower extremities. Good ROM, no contractures. Normal muscle tone.  Skin: no rashes, lesions, ulcers. No induration Neurologic: CN 2-12 grossly intact.  Strength 5/5 in all 4.  Psychiatric: Normal judgment and insight. Alert and oriented x 3. Normal mood.   Labs on Admission: I have personally reviewed following labs and imaging studies  CBC: Recent Labs  Lab 04/22/18 1420  WBC 10.4  NEUTROABS 7.5  HGB 13.4    HCT 44.7  MCV 81.3  PLT 981   Basic Metabolic Panel: Recent Labs  Lab 04/22/18 1420  NA 137  K 3.9  CL 102  CO2 26  GLUCOSE 97  BUN 16  CREATININE 1.00  CALCIUM 8.7*   Liver Function Tests: Recent Labs  Lab 04/22/18 1420  AST 50*  ALT 46*  ALKPHOS 71  BILITOT 0.7  PROT 8.3*  ALBUMIN 3.8   Recent Labs  Lab 04/22/18 1420  LIPASE 24   Cardiac Enzymes: Recent Labs  Lab 04/22/18 1420  TROPONINI <0.03   Thyroid Function Tests: Recent Labs    04/22/18 1420  TSH 4.717*    Radiological Exams on Admission: Dg Chest 2 View  Result Date: 04/22/2018 CLINICAL DATA:  Chest pain. EXAM: CHEST - 2 VIEW COMPARISON:  Oct 08, 2017 FINDINGS: Stable pacemaker. The heart, hila, mediastinum, lungs, and pleura are unremarkable. IMPRESSION: No active cardiopulmonary disease. Electronically Signed   By: Dorise Bullion III M.D   On: 04/22/2018 15:24   Ct Angio Chest Pe W And/or Wo Contrast  Result Date: 04/22/2018 CLINICAL DATA:  Chest pain for a few days.  Shortness of breath. EXAM: CT ANGIOGRAPHY CHEST WITH CONTRAST TECHNIQUE: Multidetector CT imaging of the chest was performed using the standard protocol during bolus administration of intravenous contrast. Multiplanar CT image reconstructions and MIPs were obtained to evaluate the vascular anatomy. CONTRAST:  146mL ISOVUE-370 IOPAMIDOL (ISOVUE-370) INJECTION 76% COMPARISON:  CT scan March 21, 2018. Chest x-ray April 22, 2018. FINDINGS: Cardiovascular: Proximal aortic arch measures 4 cm today, unchanged. No other aneurysm. No dissection. Mild atherosclerotic changes. Cardiomegaly. Stable pacemaker terminating in the right atrium and right ventricle. Evaluation for pulmonary emboli is limited due to respiratory motion and timing of contrast. The images are also noisy due to patient body habitus. No emboli in the main pulmonary arteries. Evaluation beyond the main pulmonary arteries is limited due to respiratory motion, stairstep  artifact, an timing of contrast. Within these limitations, no convincing emboli are noted. Mediastinum/Nodes: The thyroid is normal. The esophagus is normal. No effusions. Continued shotty nodes in the mediastinum, likely reactive. No gross adenopathy. Lungs/Pleura: Central airways are normal. No pneumothorax. No nodules, masses, or focal infiltrates to suggest pneumonia or aspiration. No overt edema. Mild pulmonary venous congestion not excluded. Upper Abdomen: No acute  abnormality. Musculoskeletal: No chest wall abnormality. No acute or significant osseous findings. Review of the MIP images confirms the above findings. IMPRESSION: 1. 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 2. No emboli in the main pulmonary arteries. Evaluation beyond the main pulmonary arteries is significantly degraded by artifact and timing of contrast. No convincing emboli noted. 3. Mild pulmonary venous congestion and cardiomegaly. Electronically Signed   By: Dorise Bullion III M.D   On: 04/22/2018 18:16    EKG: Independently reviewed.  Sinus rhythm no ST or T wave changes. 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  QTc 487.  Assessment/Plan Active Problems:   Chest pain   Chest pain-with shortness of breath only with exertion (per chart these symptoms are not new as noted 10/31/9 at cardiology visit). EKG troponin unremarkable.  Recent echo 03/22/18-EF 55% with normal diastolic function parameters, pulmonary artery was not visualized, PA systolic pressure could not be accurately estimated.  CTA chest negative for PE, mild vascular congestion, BNP improved compared to prior.  Clean coronaries per cath 06/2017. Chest pain most likely musculoskeletal pain with point tenderness to palpation.  -Trend trops -EKG a.m. -Tramadol for pain.  60 mg prednisone given in ED. voiding NSAIDs while on anticoagulation with Xarelto -Recent cath Right heart Cath and coronary angiography 07/16/17-normal right heart pressures, no angiographic  evidence of CAD.  HTN-stable -Continue home lisinopril  Pulmonary hypertension-  per chart, but Right heart Cath 07/16/17-normal right heart pressures, no angiographic evidence of CAD.  Pulmonary embolism- diagnosed 09/18/17, with evidence of right heart strain. Today CTA chest negative for PE.  Patient compliant with Xarelto -Continue anticoagulation with Xarelto  Paroxysmal A. fib, aortic valve replacement, pace maker status-follows with Dr. Bronson Ing.  Aortic valve replacement for severe bicuspid aortic valve stenosis, and thoracic aortic aneurysm repair 08/23/2017.  To complete heart block and underwent pacemaker replacement 08/28/2017.  -Continue Xarelto  Diastolic CHF-weight stable but 50 pound weight gain over the past 6 months, likely adipose tissue.  BNP 89,  improved prior to prior.  Shortness of breath only with exertion-likely more from morbid obesity and deconditioning.  O2 sats 95% on room air with ambulation. CT chest suggest mild vascular congestion, but no peripheral sign of volume overload. 578ml given in ED. -IV Lasix 60 X 1  in a.m. As he was exposed to contrast today. Then resume home lasix 80 daily -Doubt need for aggressive diuresis at this time - Intake output  Morbid obesity- likely causing patient's dyspnea on exertion.  BMI 66.   DVT prophylaxis: Xarelto Code Status: Full Family Communication: None at bedside Disposition Plan: 1- 2 days Consults called: None Admission status: Obs, tele   Bethena Roys MD [facility] Pager [phone] From 3PM-11PM.  Otherwise please contact night-coverage [email or web address] Password [facility]  04/22/2018, 11:25 PM

## 2018-04-22 NOTE — ED Notes (Signed)
RT notified for neb tx. 

## 2018-04-22 NOTE — ED Notes (Signed)
Pt given sprite, crackers, & peanut butter

## 2018-04-23 ENCOUNTER — Encounter (HOSPITAL_COMMUNITY): Payer: Self-pay | Admitting: *Deleted

## 2018-04-23 DIAGNOSIS — I10 Essential (primary) hypertension: Secondary | ICD-10-CM

## 2018-04-23 DIAGNOSIS — K219 Gastro-esophageal reflux disease without esophagitis: Secondary | ICD-10-CM

## 2018-04-23 DIAGNOSIS — E669 Obesity, unspecified: Secondary | ICD-10-CM

## 2018-04-23 LAB — BASIC METABOLIC PANEL
Anion gap: 11 (ref 5–15)
BUN: 19 mg/dL (ref 6–20)
CO2: 21 mmol/L — AB (ref 22–32)
Calcium: 9 mg/dL (ref 8.9–10.3)
Chloride: 104 mmol/L (ref 98–111)
Creatinine, Ser: 0.95 mg/dL (ref 0.61–1.24)
GFR calc Af Amer: >60 mL/min (ref >60)
GFR calc non Af Amer: >60 mL/min (ref >60)
Glucose, Bld: 157 mg/dL — ABNORMAL HIGH (ref 70–99)
Potassium: 4.9 mmol/L (ref 3.5–5.1)
Sodium: 136 mmol/L (ref 135–145)

## 2018-04-23 LAB — TROPONIN I
Troponin I: <0.03 ng/mL (ref <0.03)
Troponin I: <0.03 ng/mL (ref <0.03)

## 2018-04-23 MED ORDER — CITALOPRAM HYDROBROMIDE 20 MG PO TABS
20.0000 mg | ORAL_TABLET | Freq: Every day | ORAL | Status: DC
  Administered 2018-04-23: 20 mg via ORAL
  Filled 2018-04-23: qty 1

## 2018-04-23 MED ORDER — ACETAMINOPHEN 325 MG PO TABS: 650.0000 mg | ORAL_TABLET | Freq: Four times a day (QID) | ORAL | Status: DC | PRN

## 2018-04-23 MED ORDER — CLONAZEPAM 0.5 MG PO TABS
0.5000 mg | ORAL_TABLET | Freq: Two times a day (BID) | ORAL | Status: DC | PRN
  Administered 2018-04-23: 0.5 mg via ORAL
  Filled 2018-04-23: qty 1

## 2018-04-23 MED ORDER — ACETAMINOPHEN 650 MG RE SUPP: 650.0000 mg | Freq: Four times a day (QID) | RECTAL | Status: DC | PRN

## 2018-04-23 MED ORDER — IPRATROPIUM-ALBUTEROL 0.5-2.5 (3) MG/3ML IN SOLN: 3.0000 mL | RESPIRATORY_TRACT | Status: DC | PRN

## 2018-04-23 MED ORDER — ALLOPURINOL 300 MG PO TABS
300.0000 mg | ORAL_TABLET | Freq: Every day | ORAL | Status: DC
  Administered 2018-04-23: 300 mg via ORAL
  Filled 2018-04-23: qty 1

## 2018-04-23 MED ORDER — TRAMADOL HCL 50 MG PO TABS
100.0000 mg | ORAL_TABLET | Freq: Three times a day (TID) | ORAL | Status: DC | PRN
  Administered 2018-04-23: 100 mg via ORAL
  Filled 2018-04-23: qty 2

## 2018-04-23 MED ORDER — FUROSEMIDE 10 MG/ML IJ SOLN
60.0000 mg | Freq: Once | INTRAMUSCULAR | Status: AC
  Administered 2018-04-23: 60 mg via INTRAVENOUS
  Filled 2018-04-23: qty 6

## 2018-04-23 MED ORDER — POTASSIUM CHLORIDE CRYS ER 10 MEQ PO TBCR
10.0000 meq | EXTENDED_RELEASE_TABLET | Freq: Every day | ORAL | Status: DC
  Administered 2018-04-23: 10 meq via ORAL
  Filled 2018-04-23: qty 1

## 2018-04-23 MED ORDER — POLYETHYLENE GLYCOL 3350 17 G PO PACK: 17.0000 g | PACK | Freq: Every day | ORAL | Status: DC | PRN

## 2018-04-23 MED ORDER — PANTOPRAZOLE SODIUM 40 MG PO TBEC: 40.0000 mg | DELAYED_RELEASE_TABLET | Freq: Two times a day (BID) | ORAL | 1 refills | Status: DC

## 2018-04-23 MED ORDER — METOPROLOL SUCCINATE ER 50 MG PO TB24
100.0000 mg | ORAL_TABLET | Freq: Every day | ORAL | Status: DC
  Administered 2018-04-23: 100 mg via ORAL
  Filled 2018-04-23: qty 2

## 2018-04-23 MED ORDER — IPRATROPIUM-ALBUTEROL 0.5-2.5 (3) MG/3ML IN SOLN
3.0000 mL | Freq: Four times a day (QID) | RESPIRATORY_TRACT | Status: DC
  Administered 2018-04-23 (×2): 3 mL via RESPIRATORY_TRACT
  Filled 2018-04-23 (×2): qty 3

## 2018-04-23 MED ORDER — ONDANSETRON HCL 4 MG PO TABS: 4.0000 mg | ORAL_TABLET | Freq: Four times a day (QID) | ORAL | Status: DC | PRN

## 2018-04-23 MED ORDER — ONDANSETRON HCL 4 MG/2ML IJ SOLN: 4.0000 mg | Freq: Four times a day (QID) | INTRAMUSCULAR | Status: DC | PRN

## 2018-04-23 MED ORDER — FUROSEMIDE 80 MG PO TABS: 80.0000 mg | ORAL_TABLET | Freq: Every day | ORAL | Status: DC

## 2018-04-23 MED ORDER — IPRATROPIUM-ALBUTEROL 0.5-2.5 (3) MG/3ML IN SOLN
3.0000 mL | Freq: Three times a day (TID) | RESPIRATORY_TRACT | Status: DC
  Administered 2018-04-23: 3 mL via RESPIRATORY_TRACT
  Filled 2018-04-23: qty 3

## 2018-04-23 NOTE — Progress Notes (Signed)
[name] discharged Home per MD order.  Discharge instructions reviewed and discussed with the patient, all questions and concerns answered. Copy of instructions and scripts given to patient.  Allergies as of 04/23/2018   No Known Allergies     Medication List    TAKE these medications   acetaminophen 325 MG tablet Commonly known as:  TYLENOL Take 650 mg by mouth every 6 (six) hours as needed for mild pain.   albuterol 108 (90 Base) MCG/ACT inhaler Commonly known as:  PROVENTIL HFA;VENTOLIN HFA Inhale 2 puffs into the lungs every 6 (six) hours as needed for wheezing or shortness of breath.   allopurinol 300 MG tablet Commonly known as:  ZYLOPRIM Take 300 mg by mouth daily.   citalopram 20 MG tablet Commonly known as:  CELEXA Take 20 mg by mouth at bedtime.   clonazePAM 1 MG tablet Commonly known as:  KLONOPIN Take 0.5-1 mg by mouth every 8 (eight) hours as needed for anxiety.   docusate sodium 100 MG capsule Commonly known as:  COLACE Take 200 mg by mouth daily as needed for mild constipation.   furosemide 80 MG tablet Commonly known as:  LASIX Take 80 mg daily, may take an extra 40 mg pm dose as needed for swelling What changed:    how much to take  how to take this  when to take this   ipratropium 0.02 % nebulizer solution Commonly known as:  ATROVENT Take 0.5 mg by nebulization every 6 (six) hours as needed for wheezing or shortness of breath.   lisinopril 20 MG tablet Commonly known as:  PRINIVIL,ZESTRIL Take 1 tablet (20 mg total) by mouth daily.   metoprolol succinate 100 MG 24 hr tablet Commonly known as:  TOPROL-XL Take 1 tablet (100 mg total) by mouth daily. Take with or immediately following a meal.   pantoprazole 40 MG tablet Commonly known as:  PROTONIX Take 1 tablet (40 mg total) by mouth 2 (two) times daily.   potassium chloride 10 MEQ tablet Commonly known as:  K-DUR,KLOR-CON Take 10 mEq by mouth daily.   rivaroxaban 20 MG Tabs  tablet Commonly known as:  XARELTO Take 1 tablet (20 mg total) by mouth daily with supper.       Patients skin is clean, dry and intact, no evidence of skin break down. IV site discontinued and catheter remains intact. Site without signs and symptoms of complications. Dressing and pressure applied.  Patient escorted to car in a wheelchair,  no distress noted upon discharge.  Ralene Muskrat Jayko Voorhees 04/23/2018 6:40 PM

## 2018-04-23 NOTE — Discharge Summary (Signed)
Physician Discharge Summary  Reginald [name] QTM:[id number] DOB: Aug 01, 1970 DOA: 04/22/2018  PCP: Monico Blitz, MD  Admit date: 04/22/2018 Discharge date: 04/23/2018  Time spent: 35 minutes  Recommendations for Outpatient Follow-up:  1. Patient needs imperative follow-up with bariatric clinic to aggressively lose weight. 2. Repeat basic metabolic panel to follow electrolytes and renal function   Discharge Diagnoses:  Super obesity Chest pain Gastroesophageal reflux disease Hypertension History of pulmonary embolism Diastolic heart failure (chronic compensated) History of paroxysmal atrial fibrillation History of aortic valve replacement and pacemaker implantation   Discharge Condition: Stable and improved.  Patient discharged home with instruction to follow-up with PCP in 10 days.  Diet recommendation: Low calorie and heart healthy diet.  Filed Weights   04/22/18 1215 04/22/18 1723 04/23/18 0530  Weight: (!) 213.2 kg (!) 214.8 kg (!) 214.7 kg    History of present illness:  As patient be written by Dr. Denton Brick on 04/22/2018  47 y.o. male with medical history significant for Morbid Obesity, HTN, aortic valve replacement, diastolic CHF, presented to the ED with complaints of constant "dull" left sided chest pain over the past few days worse with deep breathing.  He denies difficulty breathing at rest, and is present only when he walks around.  Denies cough.  No wheezing.  Denies falls.  Nothing has helped with the pain.  Quit smoking cigarettes 8 months ago, April 2019.  Previously smoked 1 pack/day.  Reports compliance daily with his Xarelto.  No change in lower extremity swelling.  ED Course: Stable vitals,O2 sats 95% at the least with ambulation on room air.  Unremarkable CBC BMP.  Troponin less than 0.03.  BNP improved compared to prior at 89.  Two-view chest x-ray-acute abnormality.  Minimally elevated d-dimer 0.53, with subsequent CTA chest-no pulmonary embolism in the main  arteries but evaluation degraded by artifacts and timing of contrast, mild pulmonary venous congestion and cardiomegaly appreciated.    Hospital Course:  1-noncardiac chest pain -Appears to be most likely secondary to reflux -Patient troponins negative x4; no acute changes suggesting ischemia on EKG or telemetry -CT angiogram of the chest demonstrated no pulmonary embolism, significant fluid buildup or acute infiltrates. -Patient in February 2019 has a right heart cath and coronary angiography that demonstrated normal right heart pressure and no angiographic evidence of coronary artery disease. -After ruling out ACS chemically patient will be discharged on PPI.  2-hypertension -Continue home antihypertensive regimen -Advised to follow heart healthy diet.  3-history of pulmonary embolism diagnosed in April 2019 -CT angios negative for pulmonary embolism during this hospitalization -Continue the use of Xarelto  4-paroxysmal atrial fibrillation -Continue Xarelto -CHADSVASC score 2  5-history of diastolic heart failure -Overall appears to be compensated -Continue home diuretics and heart medication regimen -Advised to follow low-sodium diet and to maintain adequate hydration -Patient instructed to check daily weights -Continue outpatient follow-up with cardiologist.  6-status post aortic valve replacement and pacemaker implantation/repair of thoracic aortic aneurysm -Patient with aortic valve replacement for severe bicuspid aortic stenosis and repair of thoracic aneurysm in April 2019 -He developed complete heart block and underwent pacemaker implantation -Continue outpatient follow-up with cardiology service -Continue the use of Xarelto and the rest of his home medication.  7-super obesity -Body mass index is 66.02 kg/m. -Low calorie diet, portion control and increase physical activity discussed with patient -80s imperative that he lose weight and I educated him about importance  to follow-up with a bariatric clinic.  Procedures:  See below for x-ray reports.  Consultations:  None  Discharge Exam: Vitals:   04/23/18 1412 04/23/18 1438  BP:  (!) 115/58  Pulse:  70  Resp:  19  Temp:  98 F (36.7 C)  SpO2: 96% 98%    General: Patient reports her mid epigastric discomfort, no shortness of breath at rest, afebrile, in no acute distress. Cardiovascular: S1 and S2, and no rubs, no gallops, unable to assess JVD with body habitus. Respiratory: Good oxygen saturation on room air; no wheezing, no crackles. Abdomen: Obese, no distention, on palpation no tenderness appreciated; Positive bowel sounds. Extremities: with chronic lymphedema; no cyanosis or clubbing.  Discharge Instructions   Discharge Instructions    Diet - low sodium heart healthy   Complete by:  As directed    Discharge instructions   Complete by:  As directed    Follow heart healthy and low-calorie diet Maintain adequate hydration Take medications as prescribed Arrange follow-up with PCP in 10 days.   Increase activity slowly   Complete by:  As directed      Allergies as of 04/23/2018   No Known Allergies     Medication List    TAKE these medications   acetaminophen 325 MG tablet Commonly known as:  TYLENOL Take 650 mg by mouth every 6 (six) hours as needed for mild pain.   albuterol 108 (90 Base) MCG/ACT inhaler Commonly known as:  PROVENTIL HFA;VENTOLIN HFA Inhale 2 puffs into the lungs every 6 (six) hours as needed for wheezing or shortness of breath.   allopurinol 300 MG tablet Commonly known as:  ZYLOPRIM Take 300 mg by mouth daily.   citalopram 20 MG tablet Commonly known as:  CELEXA Take 20 mg by mouth at bedtime.   clonazePAM 1 MG tablet Commonly known as:  KLONOPIN Take 0.5-1 mg by mouth every 8 (eight) hours as needed for anxiety.   docusate sodium 100 MG capsule Commonly known as:  COLACE Take 200 mg by mouth daily as needed for mild constipation.    furosemide 80 MG tablet Commonly known as:  LASIX Take 80 mg daily, may take an extra 40 mg pm dose as needed for swelling What changed:    how much to take  how to take this  when to take this   ipratropium 0.02 % nebulizer solution Commonly known as:  ATROVENT Take 0.5 mg by nebulization every 6 (six) hours as needed for wheezing or shortness of breath.   lisinopril 20 MG tablet Commonly known as:  PRINIVIL,ZESTRIL Take 1 tablet (20 mg total) by mouth daily.   metoprolol succinate 100 MG 24 hr tablet Commonly known as:  TOPROL-XL Take 1 tablet (100 mg total) by mouth daily. Take with or immediately following a meal.   pantoprazole 40 MG tablet Commonly known as:  PROTONIX Take 1 tablet (40 mg total) by mouth 2 (two) times daily.   potassium chloride 10 MEQ tablet Commonly known as:  K-DUR,KLOR-CON Take 10 mEq by mouth daily.   rivaroxaban 20 MG Tabs tablet Commonly known as:  XARELTO Take 1 tablet (20 mg total) by mouth daily with supper.      No Known Allergies Follow-up Information    Monico Blitz, MD. Schedule an appointment as soon as possible for a visit in 10 day(s).   Specialty:  Internal Medicine Contact information: [place] [address] [phone]        Herminio Commons, MD .   Specialty:  Cardiology Contact information: [place]  [id number] [phone]           The results of significant diagnostics from this hospitalization (including imaging, microbiology, ancillary and laboratory) are listed below for reference.    Significant Diagnostic Studies: Dg Chest 2 View  Result Date: 04/22/2018 CLINICAL DATA:  Chest pain. EXAM: CHEST - 2 VIEW COMPARISON:  Oct 08, 2017 FINDINGS: Stable pacemaker. The heart, hila, mediastinum, lungs, and pleura are unremarkable. IMPRESSION: No active cardiopulmonary disease. Electronically Signed   By: Dorise Bullion III M.D   On: 04/22/2018 15:24   Ct Angio Chest Pe W And/or  Wo Contrast  Result Date: 04/22/2018 CLINICAL DATA:  Chest pain for a few days.  Shortness of breath. EXAM: CT ANGIOGRAPHY CHEST WITH CONTRAST TECHNIQUE: Multidetector CT imaging of the chest was performed using the standard protocol during bolus administration of intravenous contrast. Multiplanar CT image reconstructions and MIPs were obtained to evaluate the vascular anatomy. CONTRAST:  165mL ISOVUE-370 IOPAMIDOL (ISOVUE-370) INJECTION 76% COMPARISON:  CT scan March 21, 2018. Chest x-ray April 22, 2018. FINDINGS: Cardiovascular: Proximal aortic arch measures 4 cm today, unchanged. No other aneurysm. No dissection. Mild atherosclerotic changes. Cardiomegaly. Stable pacemaker terminating in the right atrium and right ventricle. Evaluation for pulmonary emboli is limited due to respiratory motion and timing of contrast. The images are also noisy due to patient body habitus. No emboli in the main pulmonary arteries. Evaluation beyond the main pulmonary arteries is limited due to respiratory motion, stairstep artifact, an timing of contrast. Within these limitations, no convincing emboli are noted. Mediastinum/Nodes: The thyroid is normal. The esophagus is normal. No effusions. Continued shotty nodes in the mediastinum, likely reactive. No gross adenopathy. Lungs/Pleura: Central airways are normal. No pneumothorax. No nodules, masses, or focal infiltrates to suggest pneumonia or aspiration. No overt edema. Mild pulmonary venous congestion not excluded. Upper Abdomen: No acute abnormality. Musculoskeletal: No chest wall abnormality. No acute or significant osseous findings. Review of the MIP images confirms the above findings. IMPRESSION: 1. 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 2. No emboli in the main pulmonary arteries. Evaluation beyond the main pulmonary arteries is significantly degraded by artifact and timing of contrast. No convincing emboli noted. 3. Mild pulmonary venous  congestion and cardiomegaly. Electronically Signed   By: Dorise Bullion III M.D   On: 04/22/2018 18:16   Labs: Basic Metabolic Panel: Recent Labs  Lab 04/22/18 1420 04/23/18 0508  NA 137 136  K 3.9 4.9  CL 102 104  CO2 26 21*  GLUCOSE 97 157*  BUN 16 19  CREATININE 1.00 0.95  CALCIUM 8.7* 9.0   Liver Function Tests: Recent Labs  Lab 04/22/18 1420  AST 50*  ALT 46*  ALKPHOS 71  BILITOT 0.7  PROT 8.3*  ALBUMIN 3.8   Recent Labs  Lab 04/22/18 1420  LIPASE 24   CBC: Recent Labs  Lab 04/22/18 1420  WBC 10.4  NEUTROABS 7.5  HGB 13.4  HCT 44.7  MCV 81.3  PLT 286   Cardiac Enzymes: Recent Labs  Lab 04/22/18 1420 04/22/18 2326 04/23/18 0508  TROPONINI <0.03 <0.03 <0.03   BNP: BNP (last 3 results) Recent Labs    09/18/17 1017 04/22/18 1420  BNP 318.2* 89.0   Signed:  Barton Dubois MD.  [facility] 04/23/2018, 4:00 PM

## 2018-04-26 DIAGNOSIS — Z139 Encounter for screening, unspecified: Secondary | ICD-10-CM

## 2018-04-26 LAB — GLUCOSE, POCT (MANUAL RESULT ENTRY): POC Glucose: 114 mg/dl — AB (ref 70–99)

## 2018-04-26 NOTE — Congregational Nurse Program (Signed)
Dept: [phone]   Congregational Nurse Program Note  Date of Encounter: 04/26/2018  Past Medical History: Past Medical History:  Diagnosis Date  . Anxiety   . Aortic stenosis   . Asthma   . Back pain   . Cellulitis of skin with lymphangitis   . CHF (congestive heart failure) ([place])   . Chronic diastolic congestive heart failure ([place])   . Chronic venous insufficiency   . Constipation   . COPD (chronic obstructive pulmonary disease) ([place])   . Dyspnea   . Essential hypertension   . Gout   . Hypertension   . Morbid obesity ([place])   . Obesity   . Pneumonia    walking pneumonia  . Prostatitis   . Pulmonary hypertension ([place])   . S/P aortic valve replacement with bioprosthetic valve 08/23/2017   25 mm Edwards Inspiris Resilia stented bovine pericardial tissue valve  . 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 Sleep apnea    cpap   . Thoracic ascending aortic aneurysm ([place])   . Thoracic ascending aortic aneurysm ([place])   . Tobacco abuse     Encounter Details: CNP Questionnaire - 04/26/18 1043      Questionnaire   Patient Status  Not Applicable    Race  White or Caucasian    Location Patient Served At  [facility]  Not Applicable    Uninsured  Uninsured (NEW 1x/quarter)    Food  No food insecurities    Housing/Utilities  Yes, have permanent housing;Worried about losing housing    Transportation  No transportation needs    Chiropractor  Yes, feel physically and emotionally safe where you currently live    Medication  Yes, have medication insecurities    Medical Provider  No    Referrals  Primary Care Provider/Clinic;[facility] Connects;Medication Assistance    ED Visit Averted  Not Applicable    Life-Saving Intervention Made  Not Applicable      New client into [facility] to enroll in [facility]. Brief medical screening performed and SDOH discussed. Client lives alone and  no longer has insurance and savings is diminishing due to unable to work. Client had Heart surgery for valve (see problem list) April, 2019 by Dr Roxy Manns. He also received a pacemaker after his surgery in April, 2019. He is being followed by Dr Pennelope Bracken cardiology along with cardiologist that follows his pacemaker follow up. He was recently seen in [place] for complaints of chest tightness and shortness of breath with exertion. See ER Notes. Client previously with Dr Manuella Ghazi in [place], but due to no insurance and no income is unable to continue to pay for his visits.   Past Medical History: Essential hypertension Chronic Diastolic Heart failure Chronic venous insuffiency Pulmonary Hypertension Pulmonary hypertension Peridontitis GERD Former smoker quit 08/23/17 Anxiety COPD Arthritis Depression  Past surgical History: Post aortic valve replacement with bioprostheticc valve Repair of ascending thoracic aortic aneurysm  See medication list In Epic. Client has not had pantoprazole from ER filled due to financial situation and he has not refilled his Citalopram at [facility]. Will plan on faxing prescription for pantoprazole to [place] with voucher for a one time assistance from [employer]. Will have medication delivered to client.  Alert and oriented to person, place and time. Client does appear anxious and jittery at times. Client complains that he still has exertional dyspnea and fatigue  and some pain under left breast on deep breath. Oxygen saturation 98% Respirations 15. Blood pressure today 159/90, pulse 89. Random glucose 114. Lungs clear to auscultation but difficult due to patient's size. Client states he has still had swelling to lower extremities. Some swelling noted in left leg. Client states he does not always weigh himself daily. He states his weight can fluctuate at times 7lbs in one day. Weight log printed and given client and suggested he ask his cardiologist  when he should call for a set weight increase. Discussed with client about looking for hidden salt in foods. Client does receive food stamps.  Client has been referred to Jearld Fenton for nutrition education, but had one visit before losing his insurance and has not been back. Discussed asking his new primary care provider regarding possible referral back to nutrition education. Client states he is unable to walk for exercise due to shortness of breath.  Discussed with client about Care Connect and services offered. Also discussed possible options for primary care. Discussed following a good nutritional plan, my plate handout given along with handouts regarding hypertension, stress reduction. 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 He has applied for disability and was denied.  Plan: One time medications assistance for pantoprazole faxed and verified with [facility] and med will be delivered to client's home.  Referral today to Care Connect and client enrolled in program by Lynelle Smoke.  Weight log printed and instructions given to client.  Social determinants of health: Food: none ( client has food stamps currently) Housing: client currently has permanent housing (rental) but he is concerned that he will lose housing due to financial needs. Transportation: client currently has his own transportation Safety: client currently feels safe at current residence Medications: Yes client has medication insecurities due to decrease in finances                       Medication given today. Referrals: [facility]: Lynelle Smoke will notify client of appointment. Case management with Care Connect: Client given contact information and CM will follow up after his first free clinic appointment. MedAssist : enrollment by Lynelle Smoke  Last Medical appointment with Dr. Manuella Ghazi  03/20/18 Last dental appointment was with [facility] and prior to valve surgery 08/2017  Re-educated client regarding notifying any dentist of valve and that he is on xarelto prior for antibotic regimen before procedure and afterwards.   Will continue to follow as needed.

## 2018-05-03 ENCOUNTER — Encounter: Payer: Self-pay | Admitting: Student

## 2018-05-03 ENCOUNTER — Ambulatory Visit (INDEPENDENT_AMBULATORY_CARE_PROVIDER_SITE_OTHER): Payer: Self-pay | Admitting: Student

## 2018-05-03 ENCOUNTER — Other Ambulatory Visit (HOSPITAL_COMMUNITY)
Admission: RE | Admit: 2018-05-03 | Discharge: 2018-05-03 | Disposition: A | Discharge status: Home / Self Care | Payer: Self-pay | Source: Ambulatory Visit | Attending: Student | Admitting: Student

## 2018-05-03 VITALS — BP 120/90 | HR 80 | Ht 71.0 in | Wt >= 6400 oz

## 2018-05-03 DIAGNOSIS — Z952 Presence of prosthetic heart valve: Secondary | ICD-10-CM

## 2018-05-03 DIAGNOSIS — R7989 Other specified abnormal findings of blood chemistry: Secondary | ICD-10-CM

## 2018-05-03 DIAGNOSIS — I5032 Chronic diastolic (congestive) heart failure: Secondary | ICD-10-CM

## 2018-05-03 DIAGNOSIS — I480 Paroxysmal atrial fibrillation: Secondary | ICD-10-CM

## 2018-05-03 DIAGNOSIS — I10 Essential (primary) hypertension: Secondary | ICD-10-CM

## 2018-05-03 DIAGNOSIS — Z86711 Personal history of pulmonary embolism: Secondary | ICD-10-CM

## 2018-05-03 DIAGNOSIS — I442 Atrioventricular block, complete: Secondary | ICD-10-CM

## 2018-05-03 LAB — T4, FREE: Free T4: 0.79 ng/dL — ABNORMAL LOW (ref 0.82–1.77)

## 2018-05-03 MED ORDER — TORSEMIDE 20 MG PO TABS: 20.0000 mg | ORAL_TABLET | Freq: Two times a day (BID) | ORAL | 3 refills | Status: DC

## 2018-05-03 NOTE — Patient Instructions (Signed)
Medication Instructions:  Your physician has recommended you make the following change in your medication:  Stop Taking Lasix Start Torsemide 20 mg Two Times Daily   If you need a refill on your cardiac medications before your next appointment, please call your pharmacy.   Lab work: Your physician recommends that you return for lab work in: Today   If you have labs (blood work) drawn today and your tests are completely normal, you will receive your results only by: Marland Kitchen MyChart Message (if you have MyChart) OR . A paper copy in the mail If you have any lab test that is abnormal or we need to change your treatment, we will call you to review the results.  Testing/Procedures: NONE   Follow-Up: At [facility], you and your health needs are our priority.  As part of our continuing mission to provide you with exceptional heart care, we have created designated Provider Care Teams.  These Care Teams include your primary Cardiologist (physician) and Advanced Practice Providers (APPs -  Physician Assistants and Nurse Practitioners) who all work together to provide you with the care you need, when you need it. You will need a follow up appointment in 2 months.  Please call our office 2 months in advance to schedule this appointment.  You may see Kate Sable, MD or one of the following Advanced Practice Providers on your designated Care Team:   Bernerd Pho, PA-C [facility]) . Ermalinda Barrios, PA-C ([facility])  Any Other Special Instructions Will Be Listed Below (If Applicable). Thank you for choosing [facility]!  You have been given samples of xarelto today

## 2018-05-03 NOTE — Progress Notes (Signed)
Cardiology Office Note    Date:  05/03/2018   ID:  Reginald [name], DOB [birth year], MRN [id number]  PCP:  Monico Blitz, MD  Cardiologist: Kate Sable, MD   EP: Dr. Rayann Heman  Chief Complaint  Patient presents with  . Follow-up    2 month visit    History of Present Illness:    Reginald [name] is a 47 y.o. male with past medical history of severe AS (s/p tissue AVR in 08/2017), post-operative atrial fibrillation (occurring following AVR), CHB (s/p PPM placement in 08/2017), no angiographic evidence of CAD by cath in 06/2017, bilateral PE (diganosed in 08/2017), HTN, and HLD who presents to the office today for 35-month follow-up.   He was most recently examined by Dr. Bronson Ing in 02/2018 and reported still having dyspnea on exertion that did not improve following valve replacement. It was thought his morbid obesity might be playing a role as he had gained 50 pounds since 11/2017. A follow-up echocardiogram was obtained to assess valvular function along with a CTA being obtained for follow-up. CTA showed no evidence of a PE or aortic dissection.  Echocardiogram showed a preserved EF of 55% with normal functioning of the prosthetic aortic valve and no evidence of a pericardial effusion.  He did present to ED on 04/22/2018 for evaluation of worsening chest discomfort and dyspnea over the past several days. Oxygen saturations remained appropriate on room air and cyclic troponin values were negative. BNP was at 89 but D-dimer was elevated.  CTA showed aneurysmal dilatation of the proximal aortic arch measuring 4 cm but no evidence of a PE  His pain was overall felt to be secondary to acid reflux and he was discharged on PPI therapy.  In talking with the patient today, he reports that his weight has continued to trend upwards on his home scales since his last office visit. Weight was previously 429 lbs in 09/2017, at 487 lbs today. He reports having baseline dyspnea on exertion which has not  improved over the past several months. He does note occasional episodes of chest discomfort which is along his left pectoral region and reproducible on palpation. He denies any recent orthopnea or PND but does have baseline lower extremity edema.  He does report worsening depression over the past several months due to his medical issues and financial concerns. He denies any SI or HI. He has applied for disability but was declined. He has not been evaluated by his PCP recently either due to lack of insurance coverage. He is scheduled to establish care with the [facility] next week.   Past Medical History:  Diagnosis Date  . Anxiety   . Aortic stenosis   . Asthma   . Back pain   . Cellulitis of skin with lymphangitis   . CHF (congestive heart failure) ([place])   . Chronic diastolic congestive heart failure ([place])   . Chronic venous insufficiency   . Constipation   . COPD (chronic obstructive pulmonary disease) ([place])   . Dyspnea   . Essential hypertension   . Gout   . Hypertension   . Morbid obesity ([place])   . Obesity   . Pneumonia    walking pneumonia  . Prostatitis   . Pulmonary hypertension ([place])   . S/P aortic valve replacement with bioprosthetic valve 08/23/2017   25 mm Edwards Inspiris Resilia stented bovine pericardial tissue valve  . 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  Sleep apnea    cpap   . Thoracic ascending aortic aneurysm ([place])   . Thoracic ascending aortic aneurysm ([place])   . Tobacco abuse     Past Surgical History:  Procedure Laterality Date  . AORTIC VALVE REPLACEMENT N/A 08/23/2017   Procedure: AORTIC VALVE REPLACEMENT (AVR) USING INSPIRIS RESILIA AORTIC VALVE SIZE 25 MM;  Surgeon: Rexene Alberts, MD;  Location: [facility];  Service: Open Heart Surgery;  Laterality: N/A;  . MULTIPLE EXTRACTIONS WITH ALVEOLOPLASTY N/A 07/23/2017   Procedure: Extraction of tooth #10 with alveoloplasty and gross  debridement of remaining teeth;  Surgeon: Lenn Cal, DDS;  Location: [facility];  Service: Oral Surgery;  Laterality: N/A;  . NO PAST SURGERIES    . PACEMAKER IMPLANT N/A 08/28/2017    St Jude Medical Assurity MRI conditional  dual-chamber pacemaker for symptomatic complete heart blockby Dr Rayann Heman  . TEE WITHOUT CARDIOVERSION N/A 07/16/2017   Procedure: TRANSESOPHAGEAL ECHOCARDIOGRAM (TEE);  Surgeon: Lelon Perla, MD;  Location: [facility] ENDOSCOPY;  Service: Cardiovascular;  Laterality: N/A;  . TEE WITHOUT CARDIOVERSION N/A 08/23/2017   Procedure: TRANSESOPHAGEAL ECHOCARDIOGRAM (TEE);  Surgeon: Rexene Alberts, MD;  Location: [facility];  Service: Open Heart Surgery;  Laterality: N/A;  . THORACIC AORTIC ANEURYSM REPAIR N/A 08/23/2017   Procedure: THORACIC ASCENDING ANEURYSM REPAIR (AAA) USING HEMASHIELD GOLD KNITTED MICROVEL DOUBLE VELOUR VASCULAR GRAFT D: 24 MM  L: 30 CM;  Surgeon: Rexene Alberts, MD;  Location: [facility];  Service: Open Heart Surgery;  Laterality: N/A;    Current Medications: Outpatient Medications Prior to Visit  Medication Sig Dispense Refill  . acetaminophen (TYLENOL) 325 MG tablet Take 650 mg by mouth every 6 (six) hours as needed for mild pain.    Marland Kitchen albuterol (PROVENTIL HFA;VENTOLIN HFA) 108 (90 Base) MCG/ACT inhaler Inhale 2 puffs into the lungs every 6 (six) hours as needed for wheezing or shortness of breath.     . allopurinol (ZYLOPRIM) 300 MG tablet Take 300 mg by mouth daily.    . citalopram (CELEXA) 20 MG tablet Take 20 mg by mouth at bedtime.  2  . clonazePAM (KLONOPIN) 1 MG tablet Take 0.5-1 mg by mouth every 8 (eight) hours as needed for anxiety.    . docusate sodium (COLACE) 100 MG capsule Take 200 mg by mouth daily as needed for mild constipation.     Marland Kitchen ipratropium (ATROVENT) 0.02 % nebulizer solution Take 0.5 mg by nebulization every 6 (six) hours as needed for wheezing or shortness of breath.    . lisinopril (PRINIVIL,ZESTRIL) 20 MG tablet Take 1 tablet (20 mg total)  by mouth daily. 90 tablet 3  . metoprolol succinate (TOPROL-XL) 100 MG 24 hr tablet Take 1 tablet (100 mg total) by mouth daily. Take with or immediately following a meal. 90 tablet 3  . pantoprazole (PROTONIX) 40 MG tablet Take 1 tablet (40 mg total) by mouth 2 (two) times daily. 60 tablet 1  . potassium chloride (K-DUR,KLOR-CON) 10 MEQ tablet Take 10 mEq by mouth daily.    . rivaroxaban (XARELTO) 20 MG TABS tablet Take 1 tablet (20 mg total) by mouth daily with supper. 35 tablet 0  . furosemide (LASIX) 80 MG tablet Take 80 mg daily, may take an extra 40 mg pm dose as needed for swelling (Patient taking differently: Take 80 mg by mouth daily. Take 80 mg daily, may take an extra 40 mg pm dose as needed for swelling) 120 tablet 3   No facility-administered medications prior to  visit.      Allergies:   Patient has no known allergies.   Social History   Socioeconomic History  . Marital status: Divorced    Spouse name: Not on file  . Number of children: Not on file  . Years of education: Not on file  . Highest education level: Not on file  Occupational History  . Not on file  Social Needs  . Financial resource strain: Somewhat hard  . Food insecurity:    Worry: Never true    Inability: Never true  . Transportation needs:    Medical: No    Non-medical: No  Tobacco Use  . Smoking status: Former Smoker    Packs/day: 0.50    Types: Cigarettes    Last attempt to quit: 08/23/2017    Years since quitting: 0.6  . Smokeless tobacco: Never Used  . Tobacco comment: has not smoked since surgery!!!!!  Substance and Sexual Activity  . Alcohol use: No    Frequency: Never    Comment: have had alcohol in the past, not heavy  . Drug use: No  . Sexual activity: Not on file  Lifestyle  . Physical activity:    Days per week: Not on file    Minutes per session: Not on file  . Stress: Not on file  Relationships  . Social connections:    Talks on phone: Not on file    Gets together: Not on file     Attends religious service: Not on file    Active member of club or organization: Not on file    Attends meetings of clubs or organizations: Not on file    Relationship status: Not on file  Other Topics Concern  . Not on file  Social History Narrative  . Not on file     Family History:  [name] family history includes Alzheimer's disease in his mother; Diabetes in his brother; Heart attack in his brother; Hypertension in his mother.   Review of Systems:   Please see the history of present illness.     General:  No chills, fever, night sweats or weight changes.  Cardiovascular:  No orthopnea, palpitations, paroxysmal nocturnal dyspnea. Positive for chest pain, dyspnea on exertion, and edema.  Dermatological: No rash, lesions/masses Respiratory: No cough, Positive for dyspnea.  Urologic: No hematuria, dysuria Abdominal:   No nausea, vomiting, diarrhea, bright red blood per rectum, melena, or hematemesis Neurologic:  No visual changes, wkns, changes in mental status. All other systems reviewed and are otherwise negative except as noted above.   Physical Exam:    VS:  BP 120/90   Pulse 80   Ht 5\' 11"  (1.803 m)   Wt (!) 487 lb (220.9 kg)   SpO2 96%   BMI 67.92 kg/m    General: Well developed, morbidly obese Caucasian male appearing in no acute distress. Head: Normocephalic, atraumatic, sclera non-icteric, no xanthomas, nares are without discharge.  Neck: No carotid bruits. JVD unable to be assessed secondary to body habitus.  Lungs: Respirations regular and unlabored, without wheezes or rales.  Heart: Regular rate and rhythm. No S3 or S4.  No murmur, no rubs, or gallops appreciated. Abdomen: Soft, non-tender, non-distended with normoactive bowel sounds. No hepatomegaly. No rebound/guarding. No obvious abdominal masses. Msk:  Strength and tone appear normal for age. No joint deformities or effusions. Extremities: No clubbing or cyanosis. 1+ pitting edema bilaterally.   Distal pedal pulses are 2+ bilaterally. Neuro: Alert and oriented X 3. Moves all extremities  spontaneously. No focal deficits noted. Psych:  Responds to questions appropriately with a normal affect. Skin: No rashes or lesions noted  Wt Readings from Last 3 Encounters:  05/03/18 (!) 487 lb (220.9 kg)  04/26/18 (!) 477 lb (216.4 kg)  04/23/18 (!) 473 lb 5.2 oz (214.7 kg)      Studies/Labs Reviewed:   EKG:  EKG is not ordered today.    Recent Labs: 08/30/2017: Magnesium 2.3 04/22/2018: ALT 46; B Natriuretic Peptide 89.0; Hemoglobin 13.4; Platelets 286; TSH 4.717 04/23/2018: BUN 19; Creatinine, Ser 0.95; Potassium 4.9; Sodium 136   Lipid Panel No results found for: CHOL, TRIG, HDL, CHOLHDL, VLDL, LDLCALC, LDLDIRECT  Additional studies/ records that were reviewed today include:   Cardiac Catheterization: 07/16/2017 1. No angiographic evidence of CAD 2. Severe aortic stenosis by TEE/TTE.  Due to turbulent flow in the ascending aorta and movement of the catheters, I could not cross the valve. The patient had a TEE this am that demonstrated severe AS. I did not feel that further attempts at crossing the valve would provide any clinical data that would change his treatment plan, though more attempts increased the risk of procedure complication. 3. Normal right heart pressures  Recommendations: Will continue planning for AVR vs TAVR. Pt to f/u with Dr. Roxy Manns after CT scans.   Echocardiogram: 03/2018 Study Conclusions  - Left ventricle: Extremely limited images despite use of Definity   contrast. Based on very limited views of the left ventricle, LVEF   looks to be grossly normal range. The cavity size was normal. The   estimated ejection fraction was 55%. Left ventricular diastolic   function parameters were normal. - Aortic valve: Reported to be status post placement of 25 mm   Edwards Inspiris Resilia stented bovine pericardial tissue valve.   Structure is poorly visualized. Mildly  to moderately calcified   annulus. There was no regurgitation. Mean gradient (S): 23 mm Hg. - Mitral valve: Mildly calcified annulus. There was trivial   regurgitation. - Right ventricle: Poorly visualized. The cavity size was mildly   dilated. Reported to have pacemaker in place - cannot visualize   wire except to limited degree in the right atrium.. - Tricuspid valve: Poorly visualized. There was trivial   regurgitation. - Pulmonary arteries: Systolic pressure could not be accurately   estimated. - Inferior vena cava: Not visualized. - Pericardium, extracardiac: There was no pericardial effusion.  Assessment:    1. Chronic diastolic congestive heart failure ([place])   2. Hx of aortic valve replacement   3. PAF (paroxysmal atrial fibrillation) ([place])   4. CHB (complete heart block) ([facility])   5. History of pulmonary embolus (PE)   6. Essential hypertension   7. Morbid obesity ([place])   8. Abnormal TSH      Plan:   In order of problems listed above:  1. Chronic Diastolic CHF - He has continued to experience worsening dyspnea on exertion with recent echocardiogram showing a preserved EF of 55% and normal functioning of his prosthetic heart valve. Recent CTA showed no evidence of recurrent PE. Suspect that his dyspnea on exertion is mostly related to his 60+ pound weight gain over the past 6 months. He is not interested in following with [facility] in [place] at this time. Was provided with information about local nutrition classes in the area. - He has remained on Lasix 80 mg daily and it was recommended that he previously try Torsemide 20 mg twice daily for improved bioavailability. He is currently  paying for all of his medications out of pocket and is unsure of the co-pay, therefore a printed Rx for this was provided at the patient's request.   2. Severe AS - s/p tissue AVR in 08/2017. Recent echocardiogram in 03/2018 showed the valve was functioning normally with no significant  regurgitation.  3. Paroxysmal Atrial Fibrillation - Initially diagnosed following his recent aortic valve replacement. His atrial fibrillation burden has been at 5% by most recent device interrogation with his last episode occurring in 09/2017. - He denies any recent palpitations. Will continue on Toprol-XL 100 mg daily for rate control. He denies any evidence of active bleeding. Remains on Xarelto for anticoagulation. He is currently without insurance coverage and has been obtaining samples regularly. Was provided for information in regards to the patient assistance program today.  4. CHB - s/p St. Jude PPM placement on 08/28/2017 by Dr. Rayann Heman.  Interrogation in 11/2017 showed normal device function with 5% AF burden as outlined above. Scheduled for a repeat remote device check in 05/2018.  5. History of PE - Diagnosed in the post-operative setting following surgery in 08/2017. He was initially on Coumadin but had subtherapeutic INR's. He has remained on Xarelto for anticoagulation given his PE and paroxysmal atrial fibrillation. Denies any evidence of active bleeding.   6. HTN - BP is well controlled at 120/90 during today's visit.  Continue Lisinopril 20 mg daily and Toprol-XL 100 mg daily.  7. Morbid Obesity - He has gained almost 60 pounds since undergoing surgery earlier this year. He is very frustrated by this and mentions feeling depressed about his weight gain and financial stressors. He denies any SI or HI. [facility] and I encouraged him to address his concerns with the provider as well. Provided with information for the [facility] in [place] which offers nutrition and exercise classes. Suspect his weight gain is mostly due to inactivity but he does report his TSH was abnormal at 4.7 when checked last month. Will check a free T4 today.    Medication Adjustments/Labs and Tests Ordered: Current medicines are reviewed at  length with the patient today.  Concerns regarding medicines are outlined above.  Medication changes, Labs and Tests ordered today are listed in the Patient Instructions below. Patient Instructions  Medication Instructions:  Your physician has recommended you make the following change in your medication:  Stop Taking Lasix Start Torsemide 20 mg Two Times Daily   If you need a refill on your cardiac medications before your next appointment, please call your pharmacy.   Lab work: Your physician recommends that you return for lab work in: Today   If you have labs (blood work) drawn today and your tests are completely normal, you will receive your results only by: Marland Kitchen MyChart Message (if you have MyChart) OR . A paper copy in the mail If you have any lab test that is abnormal or we need to change your treatment, we will call you to review the results.  Testing/Procedures: NONE   Follow-Up: At [facility], you and your health needs are our priority.  As part of our continuing mission to provide you with exceptional heart care, we have created designated Provider Care Teams.  These Care Teams include your primary Cardiologist (physician) and Advanced Practice Providers (APPs -  Physician Assistants and Nurse Practitioners) who all work together to provide you with the care you need, when you need it. You will need  a follow up appointment in 2 months.  Please call our office 2 months in advance to schedule this appointment.  You may see Kate Sable, MD or one of the following Advanced Practice Providers on your designated Care Team:   Bernerd Pho, PA-C [facility]) . Ermalinda Barrios, PA-C ([place])  Any Other Special Instructions Will Be Listed Below (If Applicable). Thank you for choosing [facility]!  You have been given samples of xarelto today    Signed, Erma Heritage, PA-C  05/03/2018 4:23 PM    [facility] [address] [place],  [address] Phone: [phone]

## 2018-05-06 ENCOUNTER — Other Ambulatory Visit: Payer: Self-pay

## 2018-05-06 ENCOUNTER — Emergency Department (HOSPITAL_COMMUNITY)
Admission: EM | Admit: 2018-05-06 | Discharge: 2018-05-06 | Disposition: A | Discharge status: Home / Self Care | Payer: Self-pay | Attending: Emergency Medicine | Admitting: Emergency Medicine

## 2018-05-06 ENCOUNTER — Emergency Department (HOSPITAL_COMMUNITY): Payer: Self-pay

## 2018-05-06 ENCOUNTER — Encounter (HOSPITAL_COMMUNITY): Payer: Self-pay

## 2018-05-06 DIAGNOSIS — Z87891 Personal history of nicotine dependence: Secondary | ICD-10-CM | POA: Insufficient documentation

## 2018-05-06 DIAGNOSIS — J449 Chronic obstructive pulmonary disease, unspecified: Secondary | ICD-10-CM | POA: Insufficient documentation

## 2018-05-06 DIAGNOSIS — J45909 Unspecified asthma, uncomplicated: Secondary | ICD-10-CM | POA: Insufficient documentation

## 2018-05-06 DIAGNOSIS — I5032 Chronic diastolic (congestive) heart failure: Secondary | ICD-10-CM | POA: Insufficient documentation

## 2018-05-06 DIAGNOSIS — R0602 Shortness of breath: Secondary | ICD-10-CM | POA: Insufficient documentation

## 2018-05-06 DIAGNOSIS — I110 Hypertensive heart disease with heart failure: Secondary | ICD-10-CM | POA: Insufficient documentation

## 2018-05-06 DIAGNOSIS — Z79899 Other long term (current) drug therapy: Secondary | ICD-10-CM | POA: Insufficient documentation

## 2018-05-06 DIAGNOSIS — Z7901 Long term (current) use of anticoagulants: Secondary | ICD-10-CM | POA: Insufficient documentation

## 2018-05-06 DIAGNOSIS — Z952 Presence of prosthetic heart valve: Secondary | ICD-10-CM | POA: Insufficient documentation

## 2018-05-06 LAB — CBC WITH DIFFERENTIAL/PLATELET
Abs Immature Granulocytes: 0.06 10*3/uL (ref 0.00–0.07)
Basophils Absolute: 0 10*3/uL (ref 0.0–0.1)
Basophils Relative: 0 %
EOS ABS: 0.4 10*3/uL (ref 0.0–0.5)
EOS PCT: 4 %
HCT: 44.7 % (ref 39.0–52.0)
Hemoglobin: 13.3 g/dL (ref 13.0–17.0)
Immature Granulocytes: 1 %
Lymphocytes Relative: 17 %
Lymphs Abs: 1.9 10*3/uL (ref 0.7–4.0)
MCH: 24.5 pg — ABNORMAL LOW (ref 26.0–34.0)
MCHC: 29.8 g/dL — ABNORMAL LOW (ref 30.0–36.0)
MCV: 82.3 fL (ref 80.0–100.0)
Monocytes Absolute: 0.7 10*3/uL (ref 0.1–1.0)
Monocytes Relative: 7 %
Neutro Abs: 7.7 10*3/uL (ref 1.7–7.7)
Neutrophils Relative %: 71 %
Platelets: 266 10*3/uL (ref 150–400)
RBC: 5.43 MIL/uL (ref 4.22–5.81)
RDW: 15.8 % — AB (ref 11.5–15.5)
WBC: 10.8 10*3/uL — AB (ref 4.0–10.5)
nRBC: 0 % (ref 0.0–0.2)

## 2018-05-06 LAB — COMPREHENSIVE METABOLIC PANEL
ALT: 42 U/L (ref 0–44)
AST: 57 U/L — ABNORMAL HIGH (ref 15–41)
Albumin: 3.8 g/dL (ref 3.5–5.0)
Alkaline Phosphatase: 77 U/L (ref 38–126)
Anion gap: 11 (ref 5–15)
BUN: 21 mg/dL — ABNORMAL HIGH (ref 6–20)
CO2: 24 mmol/L (ref 22–32)
Calcium: 8.7 mg/dL — ABNORMAL LOW (ref 8.9–10.3)
Chloride: 103 mmol/L (ref 98–111)
Creatinine, Ser: 0.93 mg/dL (ref 0.61–1.24)
GFR calc Af Amer: >60 mL/min (ref >60)
Glucose, Bld: 123 mg/dL — ABNORMAL HIGH (ref 70–99)
Potassium: 4.1 mmol/L (ref 3.5–5.1)
Sodium: 138 mmol/L (ref 135–145)
TOTAL PROTEIN: 7.9 g/dL (ref 6.5–8.1)
Total Bilirubin: 1.2 mg/dL (ref 0.3–1.2)

## 2018-05-06 LAB — TROPONIN I: Troponin I: <0.03 ng/mL (ref <0.03)

## 2018-05-06 LAB — BRAIN NATRIURETIC PEPTIDE: B Natriuretic Peptide: 113 pg/mL — ABNORMAL HIGH (ref 0.0–100.0)

## 2018-05-06 MED ORDER — FUROSEMIDE 10 MG/ML IJ SOLN
60.0000 mg | Freq: Once | INTRAMUSCULAR | Status: AC
  Administered 2018-05-06: 60 mg via INTRAMUSCULAR

## 2018-05-06 MED ORDER — ALBUTEROL SULFATE (2.5 MG/3ML) 0.083% IN NEBU: 5.0000 mg | INHALATION_SOLUTION | Freq: Four times a day (QID) | RESPIRATORY_TRACT | 0 refills | Status: DC | PRN

## 2018-05-06 MED ORDER — NITROGLYCERIN 2 % TD OINT
1.0000 [in_us] | TOPICAL_OINTMENT | Freq: Once | TRANSDERMAL | Status: AC
  Administered 2018-05-06: 1 [in_us] via TOPICAL
  Filled 2018-05-06: qty 1

## 2018-05-06 MED ORDER — FUROSEMIDE 10 MG/ML IJ SOLN
60.0000 mg | Freq: Once | INTRAMUSCULAR | Status: DC
  Filled 2018-05-06: qty 6

## 2018-05-06 MED ORDER — ALBUTEROL SULFATE (2.5 MG/3ML) 0.083% IN NEBU
5.0000 mg | INHALATION_SOLUTION | Freq: Once | RESPIRATORY_TRACT | Status: AC
  Administered 2018-05-06: 5 mg via RESPIRATORY_TRACT
  Filled 2018-05-06: qty 6

## 2018-05-06 NOTE — ED Notes (Signed)
RT called for neb tx.

## 2018-05-06 NOTE — ED Provider Notes (Addendum)
[facility] EMERGENCY DEPARTMENT Provider Note   CSN: [id number] Arrival date & time: 05/06/18  0308  Time seen 3:45 AM   History   Chief Complaint Chief Complaint  Patient presents with  . Cough  . Dizziness    HPI Reginald [name] is a 47 y.o. male.  HPI patient states he had a aortic valve replacement and when I look at his chart he had a thoracic aneurysm repair done in April 2019.  He states he was having shortness of breath before the surgery but it has been getting worse.  He states during the day today he was "staggering".  He states he was not dizzy or lightheaded.  His legs have been weak for at least a year and he feels weak all over.  He states he tried to go to the grocery store today and walk around but he was too tired and short of breath.  He started having a cough this evening and states he is coughing up white mucus and sometimes it is red-tinged.  He states his throat feels raw.  He states he had his fluid pills changed on Friday, December 12 from Lasix 80 mg in the morning and 40 mg in the evening to torsemide 20 mg twice a day.  He states he is having left lower chest pain that he had and was seen and evaluated in the ER for last week and was told it was indigestion.  He states he has had chills at home today and his temperature was 100 at home but was normal when he got here.  PCP Monico Blitz, MD Cardiology Dr Jacinta Shoe  Past Medical History:  Diagnosis Date  . Anxiety   . Aortic stenosis   . Asthma   . Back pain   . Cellulitis of skin with lymphangitis   . CHF (congestive heart failure) ([place])   . Chronic diastolic congestive heart failure ([place])   . Chronic venous insufficiency   . Constipation   . COPD (chronic obstructive pulmonary disease) ([place])   . Dyspnea   . Essential hypertension   . Gout   . Hypertension   . Morbid obesity ([place])   . Obesity   . Pneumonia    walking pneumonia  . Prostatitis   . Pulmonary hypertension ([place])   . S/P aortic  valve replacement with bioprosthetic valve 08/23/2017   25 mm Edwards Inspiris Resilia stented bovine pericardial tissue valve  . 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 Sleep apnea    cpap   . Thoracic ascending aortic aneurysm ([place])   . Thoracic ascending aortic aneurysm ([place])   . Tobacco abuse     Patient Active Problem List   Diagnosis Date Noted  . Gastroesophageal reflux disease   . Chest pain 04/22/2018  . Pulmonary embolism ([place]) 09/18/2017  . Fever 09/18/2017  . Encounter for therapeutic drug monitoring 09/04/2017  . 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 08/23/2017  . 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 Pulmonary hypertension ([place])   . Chronic periodontitis 07/18/2017  . Super obesity   . Essential hypertension   . Tobacco abuse   . Chronic diastolic congestive heart failure ([place])   . Chronic venous insufficiency     Past Surgical History:  Procedure Laterality Date  . AORTIC VALVE REPLACEMENT N/A 08/23/2017   Procedure: AORTIC VALVE REPLACEMENT (AVR) USING INSPIRIS RESILIA AORTIC VALVE SIZE 25  MM;  Surgeon: Rexene Alberts, MD;  Location: [place];  Service: Open Heart Surgery;  Laterality: N/A;  . MULTIPLE EXTRACTIONS WITH ALVEOLOPLASTY N/A 07/23/2017   Procedure: Extraction of tooth #10 with alveoloplasty and gross debridement of remaining teeth;  Surgeon: Lenn Cal, DDS;  Location: [place];  Service: Oral Surgery;  Laterality: N/A;  . NO PAST SURGERIES    . PACEMAKER IMPLANT N/A 08/28/2017    St Jude Medical Assurity MRI conditional  dual-chamber pacemaker for symptomatic complete heart blockby Dr Rayann Heman  . TEE WITHOUT CARDIOVERSION N/A 07/16/2017   Procedure: TRANSESOPHAGEAL ECHOCARDIOGRAM (TEE);  Surgeon: Lelon Perla, MD;  Location: [facility] ENDOSCOPY;  Service: Cardiovascular;  Laterality: N/A;  . TEE WITHOUT CARDIOVERSION N/A 08/23/2017    Procedure: TRANSESOPHAGEAL ECHOCARDIOGRAM (TEE);  Surgeon: Rexene Alberts, MD;  Location: [place];  Service: Open Heart Surgery;  Laterality: N/A;  . THORACIC AORTIC ANEURYSM REPAIR N/A 08/23/2017   Procedure: THORACIC ASCENDING ANEURYSM REPAIR (AAA) USING HEMASHIELD GOLD KNITTED MICROVEL DOUBLE VELOUR VASCULAR GRAFT D: 24 MM  L: 30 CM;  Surgeon: Rexene Alberts, MD;  Location: [place];  Service: Open Heart Surgery;  Laterality: N/A;        Home Medications    Prior to Admission medications   Medication Sig Start Date End Date Taking? Authorizing Provider  acetaminophen (TYLENOL) 325 MG tablet Take 650 mg by mouth every 6 (six) hours as needed for mild pain.    [provider]  albuterol (PROVENTIL HFA;VENTOLIN HFA) 108 (90 Base) MCG/ACT inhaler Inhale 2 puffs into the lungs every 6 (six) hours as needed for wheezing or shortness of breath.     [provider]  albuterol (PROVENTIL) (2.5 MG/3ML) 0.083% nebulizer solution Take 6 mLs (5 mg total) by nebulization every 6 (six) hours as needed for wheezing or shortness of breath. 05/06/18   Rolland Porter, MD  allopurinol (ZYLOPRIM) 300 MG tablet Take 300 mg by mouth daily.    [provider]  citalopram (CELEXA) 20 MG tablet Take 20 mg by mouth at bedtime. 08/12/17   [provider]  clonazePAM (KLONOPIN) 1 MG tablet Take 0.5-1 mg by mouth every 8 (eight) hours as needed for anxiety.    [provider]  docusate sodium (COLACE) 100 MG capsule Take 200 mg by mouth daily as needed for mild constipation.     [provider]  ipratropium (ATROVENT) 0.02 % nebulizer solution Take 0.5 mg by nebulization every 6 (six) hours as needed for wheezing or shortness of breath.    [provider]  lisinopril (PRINIVIL,ZESTRIL) 20 MG tablet Take 1 tablet (20 mg total) by mouth daily. 09/21/17 05/03/18  Strader, Fransisco Hertz, PA-C  metoprolol succinate (TOPROL-XL) 100 MG 24 hr tablet Take 1 tablet (100 mg total) by  mouth daily. Take with or immediately following a meal. 09/21/17   Strader, [place] M, PA-C  pantoprazole (PROTONIX) 40 MG tablet Take 1 tablet (40 mg total) by mouth 2 (two) times daily. 04/23/18 04/23/19  Barton Dubois, MD  potassium chloride (K-DUR,KLOR-CON) 10 MEQ tablet Take 10 mEq by mouth daily.    [provider]  rivaroxaban (XARELTO) 20 MG TABS tablet Take 1 tablet (20 mg total) by mouth daily with supper. 02/26/18   Herminio Commons, MD  torsemide (DEMADEX) 20 MG tablet Take 1 tablet (20 mg total) by mouth 2 (two) times daily. 05/03/18 08/01/18  Erma Heritage, PA-C    Family History Family History  Problem Relation Age  of Onset  . Hypertension Mother   . Alzheimer's disease Mother   . Heart attack Brother   . Diabetes Brother     Social History Social History   Tobacco Use  . Smoking status: Former Smoker    Packs/day: 0.50    Types: Cigarettes    Last attempt to quit: 08/23/2017    Years since quitting: 0.7  . Smokeless tobacco: Never Used  . Tobacco comment: has not smoked since surgery!!!!!  Substance Use Topics  . Alcohol use: No    Frequency: Never    Comment: have had alcohol in the past, not heavy  . 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   Review of Systems Review of Systems  All other systems reviewed and are negative.    Physical Exam Updated Vital Signs BP 134/70   Pulse (!) 107 Comment: Simultaneous filing. User may not have seen previous data.  Temp 98.6 F (37 C) (Oral)   Resp (!) 22   Ht 5\' 11"  (1.803 m)   Wt (!) 220.9 kg   SpO2 94%   BMI 67.92 kg/m   Physical Exam Vitals signs and nursing note reviewed.  Constitutional:      General: He is not in acute distress.    Appearance: Normal appearance. He is well-developed. He is obese. He is not ill-appearing or toxic-appearing.  HENT:     Head: Normocephalic and  atraumatic.     Right Ear: External ear normal.     Left Ear: External ear normal.     Nose: Nose normal. No mucosal edema or rhinorrhea.     Mouth/Throat:     Dentition: No dental abscesses.     Pharynx: No uvula swelling.  Eyes:     Conjunctiva/sclera: Conjunctivae normal.     Pupils: Pupils are equal, round, and reactive to light.  Neck:     Musculoskeletal: Full passive range of motion without pain, normal range of motion and neck supple.  Cardiovascular:     Rate and Rhythm: Normal rate and regular rhythm.     Heart sounds: Normal heart sounds. No murmur. No friction rub. No gallop.   Pulmonary:     Effort: Tachypnea and respiratory distress present.     Breath sounds: Decreased breath sounds present. No wheezing, rhonchi or rales.  Chest:     Chest wall: No tenderness or crepitus.  Abdominal:     General: Bowel sounds are normal. There is no distension.     Palpations: Abdomen is soft.     Tenderness: There is no abdominal tenderness. There is no guarding or rebound.  Musculoskeletal: Normal range of motion.        General: No tenderness.     Comments: Patient is noted to have enlargement of of both lower legs but the left is worse.  He states he had cellulitis in that leg and since then it has been more swollen than the right.  Skin:    General: Skin is warm and dry.     Coloration: Skin is not pale.     Findings: No erythema or rash.  Neurological:     Mental Status: He is alert and oriented to person, place, and time.     Cranial Nerves: No cranial nerve deficit.  Psychiatric:        Mood and Affect: Mood is not anxious.  Speech: Speech normal.        Behavior: Behavior normal.         ED Treatments / Results  Labs (all labs ordered are listed, but only abnormal results are displayed) Labs Reviewed  COMPREHENSIVE METABOLIC PANEL - Abnormal; Notable for the following components:      Result Value   Glucose, Bld 123 (*)    BUN 21 (*)    Calcium 8.7  (*)    AST 57 (*)    All other components within normal limits  BRAIN NATRIURETIC PEPTIDE - Abnormal; Notable for the following components:   B Natriuretic Peptide 113.0 (*)    All other components within normal limits  CBC WITH DIFFERENTIAL/PLATELET - Abnormal; Notable for the following components:   WBC 10.8 (*)    MCH 24.5 (*)    MCHC 29.8 (*)    RDW 15.8 (*)    All other components within normal limits  TROPONIN I    EKG EKG Interpretation  Date/Time:  Monday May 06 2018 03:25:18 EST Ventricular Rate:  95 PR Interval:    QRS Duration: 163 QT Interval:  392 QTC Calculation: 493 R Axis:   45 Text Interpretation:  Sinus rhythm IVCD, consider atypical RBBB Repol abnrm suggests ischemia, lateral leads No significant change since last tracing 23 Apr 2018 Confirmed by Rolland Porter [phone]) on 05/06/2018 3:58:30 AM   Radiology Dg Chest 2 View  Result Date: 05/06/2018 CLINICAL DATA:  Cough, headache and dizziness. Shortness of breath. History of aortic valve replacement, COPD. EXAM: CHEST - 2 VIEW COMPARISON:  Chest radiograph April 22, 2018. FINDINGS: Cardiac silhouette is mildly enlarged. Mediastinal silhouette is not suspicious, status post median sternotomy for cardiac valve replacement. Two lead LEFT cardiac pacemaker in situ. Pulmonary vascular congestion without pleural effusion or focal consolidation. No pneumothorax. Increased lung volumes. Large body habitus. Osseous structures are non suspicious. Chronic deformity RIGHT distal clavicle. IMPRESSION: 1. Mild cardiomegaly and pulmonary vascular congestion. 2. Hyperinflation/COPD. Electronically Signed   By: Elon Alas M.D.   On: 05/06/2018 05:42    Procedures Procedures (including critical care time)  Medications Ordered in ED Medications  nitroGLYCERIN (NITROGLYN) 2 % ointment 1 inch (1 inch Topical Given 05/06/18 0408)  furosemide (LASIX) injection 60 mg (60 mg Intramuscular Given 05/06/18 0445)  albuterol  (PROVENTIL) (2.5 MG/3ML) 0.083% nebulizer solution 5 mg (5 mg Nebulization Given 05/06/18 0717)     Initial Impression / Assessment and Plan / ED Course  I have reviewed the triage vital signs and the nursing notes.  Pertinent labs & imaging results that were available during my care of the patient were reviewed by me and considered in my medical decision making (see chart for details).    Laboratory testing was done.  Patient was given Lasix 60 mg IM due to inability to get IV access, and nitroglycerin was placed on his chest.  Laboratory testing was done, chest x-ray was done.  6:55 AM patient has had 500 cc urinary output.  A albuterol inhaler was ordered.  7:08 AM patient has not had his nebulizer yet.  He was informed it was ordered.  He states he has inhalers to use at home which he used prior to coming to the ED yesterday.  He states he has run out of the medication for his nebulizer.  Discussed his test results which at this point does not show evidence of congestive heart failure.  After his nebulizer treatment I think he can go home and I  will give him a prescription for some more of his nebulizer albuterol.  Patient is breathing better after the nebulizer, he states he is able to cough up the mucus he has in his chest better.  Patient was just seen by the cardiology office on December 13.  They note he had postoperative atrial fibrillation following his atrial valve replacement, and had a normal cath as far as coronary artery disease and February 2019.  He did have bilateral PE in April 2019.  She documents he has been complaining of continued dyspnea on exertion that did not improve after his aortic valve replacement which is exactly what he told me.  They note he had gained 50 pounds since July.  His echocardiogram shows ejection fraction 55% with normal functioning of the prosthetic aortic valve no evidence of pericardial effusion.  He had a CTA done on December 2 in the ED which did  not show evidence of a PE.  Final Clinical Impressions(s) / ED Diagnoses   Final diagnoses:  Shortness of breath    ED Discharge Orders         Ordered    albuterol (PROVENTIL) (2.5 MG/3ML) 0.083% nebulizer solution  Every 6 hours PRN     05/06/18 0800         Plan discharge  Rolland Porter, MD, Barbette Or, MD 05/06/18 3888    Rolland Porter, MD 05/06/18 [phone]

## 2018-05-06 NOTE — ED Notes (Signed)
Pt given ice chips

## 2018-05-06 NOTE — ED Notes (Signed)
Pt in xray

## 2018-05-06 NOTE — ED Notes (Signed)
Pt given ice chips per MD approval. 

## 2018-05-06 NOTE — ED Triage Notes (Signed)
Pt comes in tonight c/o cough, headache, dizziness. Dizziness started today. SOB started last week.

## 2018-05-06 NOTE — Discharge Instructions (Addendum)
Take all of your medications as directed.  Use your inhaler and your nebulizer as needed for shortness of breath.  Talk to your doctor about a pulmonary referral to further evaluate your shortness of breath.

## 2018-05-06 NOTE — ED Notes (Signed)
IV access attempted twice- unsuccessful [place], Warehouse manager notified.

## 2018-05-07 ENCOUNTER — Ambulatory Visit: Payer: Self-pay | Admitting: Physician Assistant

## 2018-05-07 ENCOUNTER — Telehealth: Payer: Self-pay | Admitting: Cardiovascular Disease

## 2018-05-07 ENCOUNTER — Encounter: Payer: Self-pay | Admitting: Physician Assistant

## 2018-05-07 VITALS — BP 120/70 | HR 102 | Temp 98.6°F | Resp 24 | Ht 70.0 in | Wt >= 6400 oz

## 2018-05-07 DIAGNOSIS — Z953 Presence of xenogenic heart valve: Secondary | ICD-10-CM

## 2018-05-07 DIAGNOSIS — G4733 Obstructive sleep apnea (adult) (pediatric): Secondary | ICD-10-CM

## 2018-05-07 DIAGNOSIS — F329 Major depressive disorder, single episode, unspecified: Secondary | ICD-10-CM

## 2018-05-07 DIAGNOSIS — I872 Venous insufficiency (chronic) (peripheral): Secondary | ICD-10-CM

## 2018-05-07 DIAGNOSIS — Z86711 Personal history of pulmonary embolism: Secondary | ICD-10-CM

## 2018-05-07 DIAGNOSIS — J449 Chronic obstructive pulmonary disease, unspecified: Secondary | ICD-10-CM

## 2018-05-07 DIAGNOSIS — R0602 Shortness of breath: Secondary | ICD-10-CM

## 2018-05-07 DIAGNOSIS — Z7689 Persons encountering health services in other specified circumstances: Secondary | ICD-10-CM

## 2018-05-07 DIAGNOSIS — R062 Wheezing: Secondary | ICD-10-CM

## 2018-05-07 DIAGNOSIS — Z6841 Body Mass Index (BMI) 40.0 and over, adult: Secondary | ICD-10-CM

## 2018-05-07 DIAGNOSIS — F32A Depression, unspecified: Secondary | ICD-10-CM

## 2018-05-07 DIAGNOSIS — I5032 Chronic diastolic (congestive) heart failure: Secondary | ICD-10-CM

## 2018-05-07 DIAGNOSIS — J441 Chronic obstructive pulmonary disease with (acute) exacerbation: Secondary | ICD-10-CM

## 2018-05-07 DIAGNOSIS — Z7901 Long term (current) use of anticoagulants: Secondary | ICD-10-CM

## 2018-05-07 MED ORDER — ALBUTEROL SULFATE (2.5 MG/3ML) 0.083% IN NEBU: 5.0000 mg | INHALATION_SOLUTION | Freq: Four times a day (QID) | RESPIRATORY_TRACT | 6 refills | Status: DC | PRN

## 2018-05-07 MED ORDER — ALBUTEROL SULFATE (2.5 MG/3ML) 0.083% IN NEBU
2.5000 mg | INHALATION_SOLUTION | Freq: Once | RESPIRATORY_TRACT | Status: AC
  Administered 2018-05-07: 2.5 mg via RESPIRATORY_TRACT

## 2018-05-07 MED ORDER — AZITHROMYCIN 250 MG PO TABS: ORAL_TABLET | ORAL | 0 refills | Status: DC

## 2018-05-07 MED ORDER — CITALOPRAM HYDROBROMIDE 20 MG PO TABS: 20.0000 mg | ORAL_TABLET | Freq: Every day | ORAL | 0 refills | Status: DC

## 2018-05-07 MED ORDER — PREDNISONE 20 MG PO TABS: ORAL_TABLET | ORAL | 0 refills | Status: DC

## 2018-05-07 NOTE — Telephone Encounter (Signed)
Please call Caryl Pina at the [facility] @ [phone] concerning the patients assistance w/ his meds .

## 2018-05-07 NOTE — Progress Notes (Signed)
BP 120/70 (BP Location: Left Arm, Patient Position: Sitting, Cuff Size: Normal)   Pulse (!) 102   Temp 98.6 F (37 C)   Resp (!) 24   SpO2 96%    Subjective:    Patient ID: Reginald [name], male    DOB: 1970/09/30, 47 y.o.   MRN: [id number]  HPI: Reginald [name] is a 47 y.o. male presenting on 05/07/2018 for New Patient (Initial Visit)   HPI   Pt was inpatient earlier this month for chest pain.  Pt with history of chronic diastolic heart failure, PAF, PE, aortic valve replacement, pacemaker placement, HTN, GERd, thoracic aneurysm repair, complete heart block.   Pt smoked for 30 years and quit earlier this year.    Pt was seen in ER yesterday and was discharged home after testing and treatment-  dx SOB.   Pt was seen by cardiology on Friday 12/13.    Pt says he has [facility]% [facility] through April  Pt says it has been a few months since he has breathed without wheezing.   Pt uses nebulizer at home- he just got new solution yesterday and it helps a lot.   Pt says he was was on maintenance inhaler in the past only rarely because he could never afford them so he could only get them when his provider gave him samples  Pt wears cpap for OSA.   Pt battling depression- was on citalopram and he says it helps a lot.   He has no social support but he does have 2 little dogs at home that provide him comfort.  He says he thought about suicide a long time ago but says that is not for him.    Pt now says breathing worse since Sunday  Pt is not excellent historian as he gets off on a tangent at times and he gets very sob with talking.   Relevant past medical, surgical, family and social history reviewed and updated as indicated. Interim medical history since our last visit reviewed. Allergies and medications reviewed and updated.   Current Outpatient Medications:  .  acetaminophen (TYLENOL) 325 MG tablet, Take 650 mg by mouth every 6 (six) hours as needed for mild pain., Disp: , Rfl:  .   albuterol (PROVENTIL HFA;VENTOLIN HFA) 108 (90 Base) MCG/ACT inhaler, Inhale 2 puffs into the lungs every 6 (six) hours as needed for wheezing or shortness of breath. , Disp: , Rfl:  .  albuterol (PROVENTIL) (2.5 MG/3ML) 0.083% nebulizer solution, Take 6 mLs (5 mg total) by nebulization every 6 (six) hours as needed for wheezing or shortness of breath., Disp: 75 mL, Rfl: 0 .  allopurinol (ZYLOPRIM) 300 MG tablet, Take 300 mg by mouth daily., Disp: , Rfl:  .  clonazePAM (KLONOPIN) 1 MG tablet, Take 0.5-1 mg by mouth every 8 (eight) hours as needed for anxiety., Disp: , Rfl:  .  docusate sodium (COLACE) 100 MG capsule, Take 200 mg by mouth daily as needed for mild constipation. , Disp: , Rfl:  .  guaiFENesin-codeine (ROBITUSSIN AC) 100-10 MG/5ML syrup, Take 5 mLs by mouth 3 (three) times daily as needed for cough., Disp: , Rfl:  .  lisinopril (PRINIVIL,ZESTRIL) 20 MG tablet, Take 1 tablet (20 mg total) by mouth daily., Disp: 90 tablet, Rfl: 3 .  metoprolol succinate (TOPROL-XL) 100 MG 24 hr tablet, Take 1 tablet (100 mg total) by mouth daily. Take with or immediately following a meal., Disp: 90 tablet, Rfl: 3 .  pantoprazole (  PROTONIX) 40 MG tablet, Take 1 tablet (40 mg total) by mouth 2 (two) times daily., Disp: 60 tablet, Rfl: 1 .  potassium chloride (K-DUR,KLOR-CON) 10 MEQ tablet, Take 10 mEq by mouth daily., Disp: , Rfl:  .  rivaroxaban (XARELTO) 20 MG TABS tablet, Take 1 tablet (20 mg total) by mouth daily with supper., Disp: 35 tablet, Rfl: 0 .  torsemide (DEMADEX) 20 MG tablet, Take 1 tablet (20 mg total) by mouth 2 (two) times daily., Disp: 180 tablet, Rfl: 3 .  citalopram (CELEXA) 20 MG tablet, Take 20 mg by mouth at bedtime., Disp: , Rfl: 2 .  ipratropium (ATROVENT) 0.02 % nebulizer solution, Take 0.5 mg by nebulization every 6 (six) hours as needed for wheezing or shortness of breath., Disp: , Rfl:      Review of Systems  Constitutional: Positive for diaphoresis, fatigue and fever.  Negative for appetite change, chills and unexpected weight change.  HENT: Positive for congestion and voice change. Negative for dental problem, drooling, ear pain, facial swelling, hearing loss, mouth sores, sneezing, sore throat and trouble swallowing.   Eyes: Negative for pain, discharge, redness, itching and visual disturbance.  Respiratory: Positive for cough, shortness of breath and wheezing. Negative for choking.   Cardiovascular: Positive for chest pain and leg swelling. Negative for palpitations.  Gastrointestinal: Positive for constipation. Negative for abdominal pain, blood in stool, diarrhea and vomiting.  Endocrine: Positive for polydipsia. Negative for cold intolerance and heat intolerance.  Genitourinary: Negative for decreased urine volume, dysuria and hematuria.  Musculoskeletal: Negative for arthralgias, back pain and gait problem.  Skin: Negative for rash.  Allergic/Immunologic: Negative for environmental allergies.  Neurological: Negative for seizures, syncope, light-headedness and headaches.  Hematological: Negative for adenopathy.  Psychiatric/Behavioral: Negative for agitation, dysphoric mood and suicidal ideas. The patient is not nervous/anxious.     Per HPI unless specifically indicated above     Objective:    BP 120/70 (BP Location: Left Arm, Patient Position: Sitting, Cuff Size: Normal)   Pulse (!) 102   Temp 98.6 F (37 C)   Resp (!) 24   SpO2 96%   Wt Readings from Last 3 Encounters:  05/06/18 (!) 486 lb 15.9 oz (220.9 kg)  05/03/18 (!) 487 lb (220.9 kg)  04/26/18 (!) 477 lb (216.4 kg)    Wt 481    Physical Exam Vitals signs reviewed.  Constitutional:      Appearance: He is well-developed. He is obese.  HENT:     Head: Normocephalic and atraumatic.  Neck:     Musculoskeletal: Neck supple.  Cardiovascular:     Rate and Rhythm: Normal rate and regular rhythm.  Pulmonary:     Effort: Tachypnea and respiratory distress present.     Breath  sounds: Wheezing present. No rhonchi or rales.  Abdominal:     General: Bowel sounds are normal.     Palpations: Abdomen is soft.     Tenderness: There is no abdominal tenderness.  Musculoskeletal:     Right lower leg: Edema present.     Left lower leg: Edema present.  Lymphadenopathy:     Cervical: No cervical adenopathy.  Skin:    General: Skin is warm and dry.  Neurological:     Mental Status: He is alert and oriented to person, place, and time.  Psychiatric:        Mood and Affect: Mood normal.        Behavior: Behavior normal.        Thought Content: Thought  content normal.     Comments: Tearful at times     Results for orders placed or performed during the hospital encounter of 05/06/18  Comprehensive metabolic panel  Result Value Ref Range   Sodium 138 135 - 145 mmol/L   Potassium 4.1 3.5 - 5.1 mmol/L   Chloride 103 98 - 111 mmol/L   CO2 24 22 - 32 mmol/L   Glucose, Bld 123 (H) 70 - 99 mg/dL   BUN 21 (H) 6 - 20 mg/dL   Creatinine, Ser 0.93 0.61 - 1.24 mg/dL   Calcium 8.7 (L) 8.9 - 10.3 mg/dL   Total Protein 7.9 6.5 - 8.1 g/dL   Albumin 3.8 3.5 - 5.0 g/dL   AST 57 (H) 15 - 41 U/L   ALT 42 0 - 44 U/L   Alkaline Phosphatase 77 38 - 126 U/L   Total Bilirubin 1.2 0.3 - 1.2 mg/dL   GFR calc non Af Amer >60 >60 mL/min   GFR calc Af Amer >60 >60 mL/min   Anion gap 11 5 - 15  Brain natriuretic peptide  Result Value Ref Range   B Natriuretic Peptide 113.0 (H) 0.0 - 100.0 pg/mL  Troponin I - Once  Result Value Ref Range   Troponin I <0.03 <0.03 ng/mL  CBC with Differential  Result Value Ref Range   WBC 10.8 (H) 4.0 - 10.5 K/uL   RBC 5.43 4.22 - 5.81 MIL/uL   Hemoglobin 13.3 13.0 - 17.0 g/dL   HCT 44.7 39.0 - 52.0 %   MCV 82.3 80.0 - 100.0 fL   MCH 24.5 (L) 26.0 - 34.0 pg   MCHC 29.8 (L) 30.0 - 36.0 g/dL   RDW 15.8 (H) 11.5 - 15.5 %   Platelets 266 150 - 400 K/uL   nRBC 0.0 0.0 - 0.2 %   Neutrophils Relative % 71 %   Neutro Abs 7.7 1.7 - 7.7 K/uL   Lymphocytes  Relative 17 %   Lymphs Abs 1.9 0.7 - 4.0 K/uL   Monocytes Relative 7 %   Monocytes Absolute 0.7 0.1 - 1.0 K/uL   Eosinophils Relative 4 %   Eosinophils Absolute 0.4 0.0 - 0.5 K/uL   Basophils Relative 0 %   Basophils Absolute 0.0 0.0 - 0.1 K/uL   Immature Granulocytes 1 %   Abs Immature Granulocytes 0.06 0.00 - 0.07 K/uL      Assessment & Plan:    Encounter Diagnoses  Name Primary?  . Encounter to establish care Yes  . Wheezing   . SOB (shortness of breath)   . Chronic obstructive pulmonary disease, unspecified COPD type ([place])   . OSA (obstructive sleep apnea)   . BMI 60.0-69.9, adult ([place])   . Chronic diastolic congestive heart failure ([place])   . Chronic venous insufficiency   . Anticoagulant long-term use   . Morbid obesity ([place])   . Super-super obese ([place])   . 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   . History of pulmonary embolism   . Depression, unspecified depression type   . COPD exacerbation ([place])     -reviewed extensive chart on this complex pt -pt given several neb treatments in office and breathing improved a lot while in office.  His breathing did worsen again when he was ambulating.  Pt counseled to use caution with ambulation due to sob -will Monitor thyroid.  Discussed with pt -will Refer to dietician -pt is signed up for [facility] -will add dulera  for copd -gave Refills neb meds - sent [facility] pt request -restart Citalopram - pt is counseled to go to [facility] for counseling and further management of anxiety and depression -Prednisone burst for copd.  Will cover with antibiotic due to elevation in wbc yesterday -Pt wants to stop ppi -pt to continue with cardiology per their recomendations -Pt to follow up here dec 30.  Pt is counseled to go to ER for worsening

## 2018-05-07 NOTE — Congregational Nurse Program (Signed)
  Dept: [phone]   Congregational Nurse Program Note  Date of Encounter: 05/03/2018  Past Medical History: Past Medical History:  Diagnosis Date  . Anxiety   . Aortic stenosis   . Asthma   . Back pain   . Cellulitis of skin with lymphangitis   . CHF (congestive heart failure) ([place])   . Chronic diastolic congestive heart failure ([place])   . Chronic venous insufficiency   . Constipation   . COPD (chronic obstructive pulmonary disease) ([place])   . Dyspnea   . Essential hypertension   . Gout   . Hypertension   . Morbid obesity ([place])   . Obesity   . Pneumonia    walking pneumonia  . Prostatitis   . Pulmonary hypertension ([place])   . S/P aortic valve replacement with bioprosthetic valve 08/23/2017   25 mm Edwards Inspiris Resilia stented bovine pericardial tissue valve  . 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 Sleep apnea    cpap   . Thoracic ascending aortic aneurysm ([place])   . Thoracic ascending aortic aneurysm ([place])   . Tobacco abuse     Encounter Details: CNP Questionnaire - 05/03/18 1345      Questionnaire   Patient Status  Not Applicable    Race  White or Caucasian    Location Patient Served At  [facility]  Not Applicable    Uninsured  Uninsured (Subsequent visits/quarter)    Food  No food insecurities    Housing/Utilities  Yes, have permanent housing;Worried about losing housing    Transportation  No transportation needs    Chiropractor  Yes, feel physically and emotionally safe where you currently live    Medication  Yes, have medication insecurities;Provided medication assistance    Medical Provider  Yes    Referrals  Medication Assistance    ED Visit Averted  Yes    Life-Saving Intervention Made  Not Applicable       Client into Reginald [name] today after being seen at [facility] in [place]. Client states the cardiologist changed his Furosemide to Torsemide and he cannot  afford to get that medication. Reginald [name] had previously helped him fill his pantoprazole on initial visit to enroll in [facility]. [name] Associate Professor for [employer] called and situation explained. Approval given for another one time assistance with Torsemide in the hopes of preventing an ER visit. Client still complains of being short of breath. Client states " I keep being told it's my weight". RN discussed daily weight logs that were given at last visit to Reginald [name] and client states that he is weighing first thing each morning as discussed and took those to his visit today. Client registered for [facility] to Health program that begins in Jan, 2020 that is 8 sessions on Healthy nutrition, lowering salt, fat and sugar within diet, along with samplings of healthy recipes.  Will continue to follow as CM for [name]

## 2018-05-07 NOTE — Telephone Encounter (Signed)
Eustace Pen, no answer. Left message for her to return call.

## 2018-05-08 NOTE — Telephone Encounter (Signed)
Sp[facility] with Caryl Pina[facility]. Pt will be able to get his xarelto through them. He will not need pt assistance from [place].

## 2018-05-09 ENCOUNTER — Other Ambulatory Visit: Payer: Self-pay | Admitting: Physician Assistant

## 2018-05-09 MED ORDER — POTASSIUM CHLORIDE CRYS ER 10 MEQ PO TBCR: 10.0000 meq | EXTENDED_RELEASE_TABLET | Freq: Every day | ORAL | 0 refills | Status: DC

## 2018-05-09 MED ORDER — LISINOPRIL 20 MG PO TABS: 20.0000 mg | ORAL_TABLET | Freq: Every day | ORAL | 1 refills | Status: DC

## 2018-05-09 MED ORDER — RIVAROXABAN 20 MG PO TABS: 20.0000 mg | ORAL_TABLET | Freq: Every day | ORAL | 0 refills | Status: DC

## 2018-05-09 MED ORDER — METOPROLOL TARTRATE 100 MG PO TABS: 100.0000 mg | ORAL_TABLET | Freq: Two times a day (BID) | ORAL | 0 refills | Status: DC

## 2018-05-09 MED ORDER — MOMETASONE FURO-FORMOTEROL FUM 200-5 MCG/ACT IN AERO: 2.0000 | INHALATION_SPRAY | Freq: Two times a day (BID) | RESPIRATORY_TRACT | 0 refills | Status: DC

## 2018-05-09 MED ORDER — ALBUTEROL SULFATE (2.5 MG/3ML) 0.083% IN NEBU: 5.0000 mg | INHALATION_SOLUTION | Freq: Four times a day (QID) | RESPIRATORY_TRACT | 1 refills | Status: DC | PRN

## 2018-05-20 ENCOUNTER — Ambulatory Visit: Payer: Self-pay | Admitting: Physician Assistant

## 2018-05-20 ENCOUNTER — Encounter: Payer: Self-pay | Admitting: Physician Assistant

## 2018-05-20 VITALS — BP 118/68 | HR 77 | Temp 98.1°F | Ht 70.0 in | Wt >= 6400 oz

## 2018-05-20 DIAGNOSIS — I10 Essential (primary) hypertension: Secondary | ICD-10-CM

## 2018-05-20 DIAGNOSIS — J449 Chronic obstructive pulmonary disease, unspecified: Secondary | ICD-10-CM

## 2018-05-20 DIAGNOSIS — I5032 Chronic diastolic (congestive) heart failure: Secondary | ICD-10-CM

## 2018-05-20 MED ORDER — CITALOPRAM HYDROBROMIDE 20 MG PO TABS: 20.0000 mg | ORAL_TABLET | Freq: Every day | ORAL | 0 refills | Status: DC

## 2018-05-20 NOTE — Progress Notes (Signed)
BP 118/68 (BP Location: Left Arm, Patient Position: Sitting, Cuff Size: Large)   Pulse 77   Temp 98.1 F (36.7 C)   Ht 5\' 10"  (1.778 m)   Wt (!) 466 lb 8 oz (211.6 kg)   SpO2 97%   BMI 66.94 kg/m    Subjective:    Patient ID: Reginald [name], male    DOB: [birth year], 47 y.o.   MRN: [id number]  HPI: MONTRE HARBOR is a 47 y.o. male presenting on 05/20/2018 for Follow-up   HPI   [facility].  He sys he is feeling better on the citalopram.   Pt states breathing is much better and he is feeling much better today than he did at previous OV.   Pt received his medications from [facility].  He has not yet started his dulera for some reason that he cannot say why.   Relevant past medical, surgical, family and social history reviewed and updated as indicated. Interim medical history since our last visit reviewed. Allergies and medications reviewed and updated.   Current Outpatient Medications:  .  acetaminophen (TYLENOL) 325 MG tablet, Take 650 mg by mouth every 6 (six) hours as needed for mild pain., Disp: , Rfl:  .  albuterol (PROVENTIL HFA;VENTOLIN HFA) 108 (90 Base) MCG/ACT inhaler, Inhale 2 puffs into the lungs every 6 (six) hours as needed for wheezing or shortness of breath. , Disp: , Rfl:  .  albuterol (PROVENTIL) (2.5 MG/3ML) 0.083% nebulizer solution, Take 6 mLs (5 mg total) by nebulization every 6 (six) hours as needed for wheezing or shortness of breath., Disp: 225 mL, Rfl: 1 .  allopurinol (ZYLOPRIM) 300 MG tablet, Take 300 mg by mouth daily., Disp: , Rfl:  .  citalopram (CELEXA) 20 MG tablet, Take 1 tablet (20 mg total) by mouth daily., Disp: 30 tablet, Rfl: 0 .  clonazePAM (KLONOPIN) 1 MG tablet, Take 0.5-1 mg by mouth every 8 (eight) hours as needed for anxiety., Disp: , Rfl:  .  lisinopril (PRINIVIL,ZESTRIL) 20 MG tablet, Take 1 tablet (20 mg total) by mouth daily., Disp: 90 tablet, Rfl: 1 .  metoprolol succinate (TOPROL-XL) 100 MG 24 hr tablet, Take 1  tablet (100 mg total) by mouth daily. Take with or immediately following a meal., Disp: 90 tablet, Rfl: 3 .  potassium chloride (K-DUR,KLOR-CON) 10 MEQ tablet, Take 1 tablet (10 mEq total) by mouth daily., Disp: 90 tablet, Rfl: 0 .  rivaroxaban (XARELTO) 20 MG TABS tablet, Take 1 tablet (20 mg total) by mouth daily with supper., Disp: 90 tablet, Rfl: 0 .  Sennosides 25 MG TABS, Take 2 tablets by mouth as needed., Disp: , Rfl:  .  torsemide (DEMADEX) 20 MG tablet, Take 1 tablet (20 mg total) by mouth 2 (two) times daily., Disp: 180 tablet, Rfl: 3 .  docusate sodium (COLACE) 100 MG capsule, Take 200 mg by mouth daily as needed for mild constipation. , Disp: , Rfl:  .  guaiFENesin-codeine (ROBITUSSIN AC) 100-10 MG/5ML syrup, Take 5 mLs by mouth 3 (three) times daily as needed for cough., Disp: , Rfl:  .  ipratropium (ATROVENT) 0.02 % nebulizer solution, Take 0.5 mg by nebulization every 6 (six) hours as needed for wheezing or shortness of breath., Disp: , Rfl:  .  metoprolol tartrate (LOPRESSOR) 100 MG tablet, Take 1 tablet (100 mg total) by mouth 2 (two) times daily. (Patient not taking: Reported on 05/20/2018), Disp: 180 tablet, Rfl: 0 .  mometasone-formoterol (DULERA) 200-5  MCG/ACT AERO, Inhale 2 puffs into the lungs 2 (two) times daily. (Patient not taking: Reported on 05/20/2018), Disp: 3 Inhaler, Rfl: 0     Review of Systems  Constitutional: Positive for fatigue. Negative for appetite change, chills, diaphoresis, fever and unexpected weight change.  HENT: Negative for congestion, dental problem, drooling, ear pain, facial swelling, hearing loss, mouth sores, sneezing, sore throat, trouble swallowing and voice change.   Eyes: Negative for pain, discharge, redness, itching and visual disturbance.  Respiratory: Negative for cough, choking, shortness of breath and wheezing.   Cardiovascular: Negative for chest pain, palpitations and leg swelling.  Gastrointestinal: Negative for abdominal pain,  blood in stool, constipation, diarrhea and vomiting.  Endocrine: Negative for cold intolerance, heat intolerance and polydipsia.  Genitourinary: Negative for decreased urine volume, dysuria and hematuria.  Musculoskeletal: Negative for arthralgias, back pain and gait problem.  Skin: Negative for rash.  Allergic/Immunologic: Negative for environmental allergies.  Neurological: Positive for light-headedness. Negative for seizures, syncope and headaches.  Hematological: Negative for adenopathy.  Psychiatric/Behavioral: Positive for dysphoric mood. Negative for agitation and suicidal ideas. The patient is nervous/anxious.     Per HPI unless specifically indicated above     Objective:    BP 118/68 (BP Location: Left Arm, Patient Position: Sitting, Cuff Size: Large)   Pulse 77   Temp 98.1 F (36.7 C)   Ht 5\' 10"  (1.778 m)   Wt (!) 466 lb 8 oz (211.6 kg)   SpO2 97%   BMI 66.94 kg/m   Wt Readings from Last 3 Encounters:  05/20/18 (!) 466 lb 8 oz (211.6 kg)  05/07/18 (!) 481 lb (218.2 kg)  05/06/18 (!) 486 lb 15.9 oz (220.9 kg)    Physical Exam Constitutional:      General: He is not in acute distress.    Appearance: He is obese. He is not ill-appearing.  HENT:     Head: Normocephalic and atraumatic.  Neck:     Musculoskeletal: Neck supple.  Cardiovascular:     Rate and Rhythm: Normal rate and regular rhythm.  Pulmonary:     Effort: Pulmonary effort is normal.     Breath sounds: Normal breath sounds.  Abdominal:     General: Bowel sounds are normal.     Palpations: Abdomen is soft.     Tenderness: There is no abdominal tenderness.  Musculoskeletal:     Comments: Lower extremities obese but not tight. No pitting edema.   Skin:    General: Skin is warm and dry.  Neurological:     Mental Status: He is alert and oriented to person, place, and time.  Psychiatric:        Mood and Affect: Mood normal.        Behavior: Behavior normal.         Assessment & Plan:    Encounter Diagnoses  Name Primary?  . Chronic obstructive pulmonary disease, unspecified COPD type ([place]) Yes  . Essential hypertension   . Super-super obese ([place])   . Chronic diastolic congestive heart failure ([place])      -counseled pt to get started on his dulera. -counseled pt to get over to [facility] for counseling -pt to continue current medications -pt counseled to continue weight loss efforts.   -pt to follow up here 2 months.  Will check labs before that OV.  Pt to RTO sooner prn

## 2018-05-29 ENCOUNTER — Ambulatory Visit (INDEPENDENT_AMBULATORY_CARE_PROVIDER_SITE_OTHER): Payer: Self-pay

## 2018-05-29 ENCOUNTER — Other Ambulatory Visit: Payer: Self-pay | Admitting: Physician Assistant

## 2018-05-29 DIAGNOSIS — I442 Atrioventricular block, complete: Secondary | ICD-10-CM

## 2018-05-29 MED ORDER — ALLOPURINOL 300 MG PO TABS: 300.0000 mg | ORAL_TABLET | Freq: Every day | ORAL | 1 refills | Status: DC

## 2018-05-30 LAB — CUP PACEART REMOTE DEVICE CHECK
Battery Remaining Longevity: 122 mo
Battery Remaining Percentage: 95.5 %
Battery Voltage: 3.02 V
Brady Statistic AP VP Percent: 1 %
Brady Statistic AP VS Percent: 2.7 %
Brady Statistic AS VP Percent: 1.7 %
Brady Statistic RA Percent Paced: 2.7 %
Date Time Interrogation Session: 20200108070024
Implantable Lead Implant Date: 20190409
Implantable Lead Implant Date: 20190409
Implantable Lead Location: 753859
Implantable Lead Location: 753860
Implantable Pulse Generator Implant Date: 20190409
Lead Channel Impedance Value: 590 Ohm
Lead Channel Pacing Threshold Amplitude: 0.5 V
Lead Channel Pacing Threshold Amplitude: 0.5 V
Lead Channel Pacing Threshold Pulse Width: 0.5 ms
Lead Channel Sensing Intrinsic Amplitude: 6.1 mV
Lead Channel Setting Pacing Amplitude: 2 V
Lead Channel Setting Pacing Amplitude: 2.5 V
Lead Channel Setting Pacing Pulse Width: 0.5 ms
Lead Channel Setting Sensing Sensitivity: 2 mV
MDC IDC MSMT LEADCHNL RA IMPEDANCE VALUE: 510 Ohm
MDC IDC MSMT LEADCHNL RA SENSING INTR AMPL: 5 mV
MDC IDC MSMT LEADCHNL RV PACING THRESHOLD PULSEWIDTH: 0.5 ms
MDC IDC STAT BRADY AS VS PERCENT: 96 %
MDC IDC STAT BRADY RV PERCENT PACED: 1.7 %
Pulse Gen Model: 2272
Pulse Gen Serial Number: 9010865

## 2018-05-30 NOTE — Progress Notes (Signed)
Remote pacemaker transmission.   

## 2018-06-06 ENCOUNTER — Encounter: Payer: Self-pay | Attending: Physician Assistant | Admitting: Nutrition

## 2018-06-06 ENCOUNTER — Encounter: Payer: Self-pay | Admitting: Nutrition

## 2018-06-06 DIAGNOSIS — I10 Essential (primary) hypertension: Secondary | ICD-10-CM | POA: Insufficient documentation

## 2018-06-06 DIAGNOSIS — Z6841 Body Mass Index (BMI) 40.0 and over, adult: Secondary | ICD-10-CM | POA: Insufficient documentation

## 2018-06-06 DIAGNOSIS — R739 Hyperglycemia, unspecified: Secondary | ICD-10-CM | POA: Insufficient documentation
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
## 2018-06-06 NOTE — Patient Instructions (Signed)
Goals 1. Follow My Plate 2. Eat three meals a day and skip meals 3. Increase fresh fruits and vegetables. 4. Drink only water. Lose 1-2 lbs

## 2018-07-05 ENCOUNTER — Other Ambulatory Visit (HOSPITAL_COMMUNITY)
Admission: RE | Admit: 2018-07-05 | Discharge: 2018-07-05 | Disposition: A | Discharge status: Home / Self Care | Payer: Self-pay | Source: Ambulatory Visit | Attending: Physician Assistant | Admitting: Physician Assistant

## 2018-07-05 ENCOUNTER — Encounter: Payer: Self-pay | Admitting: Cardiovascular Disease

## 2018-07-05 ENCOUNTER — Ambulatory Visit (INDEPENDENT_AMBULATORY_CARE_PROVIDER_SITE_OTHER): Payer: Self-pay | Admitting: Cardiovascular Disease

## 2018-07-05 VITALS — BP 118/78 | HR 62 | Ht 71.0 in | Wt >= 6400 oz

## 2018-07-05 DIAGNOSIS — Z953 Presence of xenogenic heart valve: Secondary | ICD-10-CM

## 2018-07-05 DIAGNOSIS — I10 Essential (primary) hypertension: Secondary | ICD-10-CM | POA: Insufficient documentation

## 2018-07-05 DIAGNOSIS — I442 Atrioventricular block, complete: Secondary | ICD-10-CM

## 2018-07-05 DIAGNOSIS — I480 Paroxysmal atrial fibrillation: Secondary | ICD-10-CM

## 2018-07-05 DIAGNOSIS — Z86711 Personal history of pulmonary embolism: Secondary | ICD-10-CM

## 2018-07-05 DIAGNOSIS — Z952 Presence of prosthetic heart valve: Secondary | ICD-10-CM

## 2018-07-05 DIAGNOSIS — Z950 Presence of cardiac pacemaker: Secondary | ICD-10-CM

## 2018-07-05 DIAGNOSIS — R600 Localized edema: Secondary | ICD-10-CM

## 2018-07-05 DIAGNOSIS — I5032 Chronic diastolic (congestive) heart failure: Secondary | ICD-10-CM | POA: Insufficient documentation

## 2018-07-05 LAB — COMPREHENSIVE METABOLIC PANEL
ALK PHOS: 73 U/L (ref 38–126)
ALT: 41 U/L (ref 0–44)
AST: 49 U/L — ABNORMAL HIGH (ref 15–41)
Albumin: 3.9 g/dL (ref 3.5–5.0)
Anion gap: 11 (ref 5–15)
BUN: 16 mg/dL (ref 6–20)
CALCIUM: 9 mg/dL (ref 8.9–10.3)
CO2: 26 mmol/L (ref 22–32)
Chloride: 102 mmol/L (ref 98–111)
Creatinine, Ser: 0.87 mg/dL (ref 0.61–1.24)
GFR calc Af Amer: >60 mL/min (ref >60)
GFR calc non Af Amer: >60 mL/min (ref >60)
Glucose, Bld: 118 mg/dL — ABNORMAL HIGH (ref 70–99)
Potassium: 3.7 mmol/L (ref 3.5–5.1)
Sodium: 139 mmol/L (ref 135–145)
Total Bilirubin: 1.1 mg/dL (ref 0.3–1.2)
Total Protein: 8 g/dL (ref 6.5–8.1)

## 2018-07-05 LAB — HEMOGLOBIN A1C
Hgb A1c MFr Bld: 5.9 % — ABNORMAL HIGH (ref 4.8–5.6)
Mean Plasma Glucose: 122.63 mg/dL

## 2018-07-05 LAB — LIPID PANEL
CHOLESTEROL: 165 mg/dL (ref 0–200)
HDL: 27 mg/dL — ABNORMAL LOW (ref >40)
LDL Cholesterol: 95 mg/dL (ref 0–99)
Total CHOL/HDL Ratio: 6.1 RATIO
Triglycerides: 214 mg/dL — ABNORMAL HIGH (ref <150)
VLDL: 43 mg/dL — ABNORMAL HIGH (ref 0–40)

## 2018-07-05 NOTE — Patient Instructions (Addendum)
Medication Instructions: Your physician recommends that you continue on your current medications as directed. Please refer to the Current Medication list given to you today.   Labwork: None today  Procedures/Testing: None today  Follow-Up: 6 months with [place] PA-C  Any Additional Special Instructions Will Be Listed Below (If Applicable).     If you need a refill on your cardiac medications before your next appointment, please call your pharmacy.

## 2018-07-05 NOTE — Progress Notes (Signed)
SUBJECTIVE: The patient presents for routine follow-up.  He has chronic exertional dyspnea and is morbidly obese.  In summary, he underwent aortic valve replacement on 08/23/2017 for severe bicuspid aortic valve stenosis with a bovine pericardial tissue aortic valve and thoracic aortic aneurysm repair. He developed postoperative atrial fibrillation and is on Xarelto and metoprolol succinate. He also has complete heart block and underwent pacemaker placement by Dr. Rayann Heman on 08/28/2017.  He has no coronary artery disease by coronary angiography on 07/16/2017.  CT angiography of the chest on 04/22/2018 showed aneurysmal dilatation of the proximal aortic arch measuring 4 cm.  There was no emboli in the main pulmonary arteries.  Most recent echocardiogram on 03/22/2018 demonstrated normal left ventricular systolic function, LVEF 30%, normal diastolic function, and a normally functioning bioprosthetic aortic valve.  He brought in a weight log for me to review.  He told me his weight fluctuates between 3 and 6 pounds on a daily basis.  He said his weight increases when he is more active but it decreases when he lies around for 2 days.  He has been going to the free clinic in [facility].  He has episodic chest soreness and has episodes of dizziness.  He weighed 471 pounds yesterday and 465 pounds today by his scales.  He has changed his diet over the last 2 to 3 weeks.  He drinks primarily water.  He eats oatmeal, 2 eggs, and applesauce for breakfast and eats a lot of green beans for lunch.  He is seeing a Microbiologist.    Review of Systems: As per "subjective", otherwise negative.  No Known Allergies  Current Outpatient Medications  Medication Sig Dispense Refill  . acetaminophen (TYLENOL) 325 MG tablet Take 650 mg by mouth every 6 (six) hours as needed for mild pain.    Marland Kitchen albuterol (PROVENTIL HFA;VENTOLIN HFA) 108 (90 Base) MCG/ACT inhaler Inhale 2 puffs into the lungs every 6 (six) hours  as needed for wheezing or shortness of breath.     Marland Kitchen albuterol (PROVENTIL) (2.5 MG/3ML) 0.083% nebulizer solution Take 6 mLs (5 mg total) by nebulization every 6 (six) hours as needed for wheezing or shortness of breath. 225 mL 1  . allopurinol (ZYLOPRIM) 300 MG tablet Take 1 tablet (300 mg total) by mouth daily. 90 tablet 1  . citalopram (CELEXA) 20 MG tablet Take 1 tablet (20 mg total) by mouth daily. 30 tablet 0  . clonazePAM (KLONOPIN) 1 MG tablet Take 0.5-1 mg by mouth every 8 (eight) hours as needed for anxiety.    . docusate sodium (COLACE) 100 MG capsule Take 200 mg by mouth daily as needed for mild constipation.     Marland Kitchen ipratropium (ATROVENT) 0.02 % nebulizer solution Take 0.5 mg by nebulization every 6 (six) hours as needed for wheezing or shortness of breath.    . lisinopril (PRINIVIL,ZESTRIL) 20 MG tablet Take 1 tablet (20 mg total) by mouth daily. 90 tablet 1  . metoprolol tartrate (LOPRESSOR) 100 MG tablet Take 1 tablet (100 mg total) by mouth 2 (two) times daily. 180 tablet 0  . mometasone-formoterol (DULERA) 200-5 MCG/ACT AERO Inhale 2 puffs into the lungs 2 (two) times daily. 3 Inhaler 0  . potassium chloride (K-DUR,KLOR-CON) 10 MEQ tablet Take 1 tablet (10 mEq total) by mouth daily. 90 tablet 0  . rivaroxaban (XARELTO) 20 MG TABS tablet Take 1 tablet (20 mg total) by mouth daily with supper. 90 tablet 0  . torsemide (DEMADEX) 20 MG tablet  Take 1 tablet (20 mg total) by mouth 2 (two) times daily. 180 tablet 3   No current facility-administered medications for this visit.     Past Medical History:  Diagnosis Date  . Anxiety   . Aortic stenosis   . Asthma   . Back pain   . Cellulitis of skin with lymphangitis   . CHF (congestive heart failure) ([place])   . Chronic diastolic congestive heart failure ([place])   . Chronic venous insufficiency   . Constipation   . COPD (chronic obstructive pulmonary disease) ([place])   . Dyspnea   . Essential hypertension   . Gout   . Hypertension     . Morbid obesity ([place])   . Obesity   . Pneumonia    walking pneumonia  . Prostatitis   . Pulmonary hypertension ([place])   . S/P aortic valve replacement with bioprosthetic valve 08/23/2017   25 mm Edwards Inspiris Resilia stented bovine pericardial tissue valve  . 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 Sleep apnea    cpap   . Thoracic ascending aortic aneurysm ([place])   . Thoracic ascending aortic aneurysm ([place])   . Tobacco abuse     Past Surgical History:  Procedure Laterality Date  . AORTIC VALVE REPLACEMENT N/A 08/23/2017   Procedure: AORTIC VALVE REPLACEMENT (AVR) USING INSPIRIS RESILIA AORTIC VALVE SIZE 25 MM;  Surgeon: Rexene Alberts, MD;  Location: [place];  Service: Open Heart Surgery;  Laterality: N/A;  . MULTIPLE EXTRACTIONS WITH ALVEOLOPLASTY N/A 07/23/2017   Procedure: Extraction of tooth #10 with alveoloplasty and gross debridement of remaining teeth;  Surgeon: Lenn Cal, DDS;  Location: [place];  Service: Oral Surgery;  Laterality: N/A;  . NO PAST SURGERIES    . PACEMAKER IMPLANT N/A 08/28/2017    St Jude Medical Assurity MRI conditional  dual-chamber pacemaker for symptomatic complete heart blockby Dr Rayann Heman  . TEE WITHOUT CARDIOVERSION N/A 07/16/2017   Procedure: TRANSESOPHAGEAL ECHOCARDIOGRAM (TEE);  Surgeon: Lelon Perla, MD;  Location: [facility] ENDOSCOPY;  Service: Cardiovascular;  Laterality: N/A;  . TEE WITHOUT CARDIOVERSION N/A 08/23/2017   Procedure: TRANSESOPHAGEAL ECHOCARDIOGRAM (TEE);  Surgeon: Rexene Alberts, MD;  Location: [place];  Service: Open Heart Surgery;  Laterality: N/A;  . THORACIC AORTIC ANEURYSM REPAIR N/A 08/23/2017   Procedure: THORACIC ASCENDING ANEURYSM REPAIR (AAA) USING HEMASHIELD GOLD KNITTED MICROVEL DOUBLE VELOUR VASCULAR GRAFT D: 24 MM  L: 30 CM;  Surgeon: Rexene Alberts, MD;  Location: [place];  Service: Open Heart Surgery;  Laterality: N/A;    Social History   Socioeconomic History  .  Marital status: Divorced    Spouse name: Not on file  . Number of children: Not on file  . Years of education: Not on file  . Highest education level: Not on file  Occupational History  . Not on file  Social Needs  . Financial resource strain: Somewhat hard  . Food insecurity:    Worry: Never true    Inability: Never true  . Transportation needs:    Medical: No    Non-medical: No  Tobacco Use  . Smoking status: Former Smoker    Packs/day: 0.50    Types: Cigarettes    Last attempt to quit: 08/23/2017    Years since quitting: 0.8  . Smokeless tobacco: Never Used  . Tobacco comment: has not smoked since surgery!!!!!  Substance and Sexual Activity  . Alcohol use: No  Frequency: Never    Comment: have had alcohol in the past, not heavy  . Drug use: No  . Sexual activity: Not on file  Lifestyle  . Physical activity:    Days per week: Not on file    Minutes per session: Not on file  . Stress: Not on file  Relationships  . Social connections:    Talks on phone: Not on file    Gets together: Not on file    Attends religious service: Not on file    Active member of club or organization: Not on file    Attends meetings of clubs or organizations: Not on file    Relationship status: Not on file  . Intimate partner violence:    Fear of current or ex partner: Not on file    Emotionally abused: Not on file    Physically abused: Not on file    Forced sexual activity: Not on file  Other Topics Concern  . Not on file  Social History Narrative  . Not on file     Vitals:   07/05/18 1115  BP: 118/78  Pulse: 62  SpO2: 98%  Weight: (!) 465 lb (210.9 kg)  Height: 5\' 11"  (1.803 m)    Wt Readings from Last 3 Encounters:  07/05/18 (!) 465 lb (210.9 kg)  06/06/18 (!) 476 lb (215.9 kg)  05/20/18 (!) 466 lb 8 oz (211.6 kg)     PHYSICAL EXAM General: NAD HEENT: Normal. Neck: Unable to assess JVP due to body habitus. Lungs: Clear to auscultation bilaterally with normal  respiratory effort. CV: Regular rate and rhythm, normal S1/S2, no N4/O2, 2/6 systolicmurmurover right upper sternal border.Trace bilateral lower extremity edema with stasis dermatitis in context of large leg habitus at baseline. Abdomen: Obese.  Neurologic: Alert and oriented.  Psych: Normal affect. Skin: Normal. Musculoskeletal: No gross deformities.    ECG: Reviewed above under Subjective   Labs: Lab Results  Component Value Date/Time   K 4.1 05/06/2018 04:57 AM   BUN 21 (H) 05/06/2018 04:57 AM   CREATININE 0.93 05/06/2018 04:57 AM   CREATININE 0.95 07/05/2017 04:56 PM   ALT 42 05/06/2018 04:57 AM   TSH 4.717 (H) 04/22/2018 02:20 PM   HGB 13.3 05/06/2018 04:57 AM     Lipids: No results found for: LDLCALC, LDLDIRECT, CHOL, TRIG, HDL     ASSESSMENT AND PLAN: 1. Aortic stenosis status post bovine pericardial tissue aortic valve replacement on 08/23/2017 with thoracic aortic aneurysm repair:  Normally functioning bioprosthetic valve by most recent echocardiogram as detailed above.  2. Paroxysmal atrial fibrillation: Currently on Xarelto 20 mg daily and metoprolol succinate. He follows in the atrial fibrillation clinic. Symptomatically stable. He had subtherapeutic INR while on warfarin.  3. Complete heart block status post pacemaker placement on 08/28/2017: Symptomatically stable. Normal device function.  4. Pulmonary emboli: Anticoagulated with Xarelto 20 mg daily. Most recent CTA showed no evidence of pulmonary embolism. He had subtherapeutic INR while on warfarin. He will need a minimum of 6 months of therapy but may need lifelong therapy if he has atrial fibrillation recurrence.    5. Hypertension: Blood pressure is  normal. No changes to therapy.  6. Bilateral leg edema: Left ventricular systolic and diastolic function are normal. Continue torsemide 20 mg twice daily.  7.  Morbid obesity: He weighs 465 pounds.  He needs significant weight loss.   Bariatric surgery should be considered.  He now sees a dietitian and has changed his diet over the last  2 to 3 weeks.  8.  Exertional dyspnea: This is likely related to morbid obesity and consequent restrictive lung disease.  He also has a history of tobacco abuse but quit smoking in April 2019.  Echocardiogram showed normal left ventricular function.    Disposition: Follow up 6 months   Kate Sable, M.D., F.A.C.C.

## 2018-07-18 ENCOUNTER — Encounter: Payer: Self-pay | Admitting: Physician Assistant

## 2018-07-18 ENCOUNTER — Ambulatory Visit: Payer: Self-pay | Admitting: Physician Assistant

## 2018-07-18 ENCOUNTER — Ambulatory Visit: Payer: Self-pay | Admitting: Nutrition

## 2018-07-18 VITALS — BP 114/60 | HR 67 | Temp 98.6°F | Ht 71.0 in | Wt >= 6400 oz

## 2018-07-18 DIAGNOSIS — Z953 Presence of xenogenic heart valve: Secondary | ICD-10-CM

## 2018-07-18 DIAGNOSIS — I10 Essential (primary) hypertension: Secondary | ICD-10-CM

## 2018-07-18 DIAGNOSIS — R350 Frequency of micturition: Secondary | ICD-10-CM

## 2018-07-18 DIAGNOSIS — Z6841 Body Mass Index (BMI) 40.0 and over, adult: Secondary | ICD-10-CM

## 2018-07-18 DIAGNOSIS — I5032 Chronic diastolic (congestive) heart failure: Secondary | ICD-10-CM

## 2018-07-18 DIAGNOSIS — R7303 Prediabetes: Secondary | ICD-10-CM

## 2018-07-18 DIAGNOSIS — E781 Pure hyperglyceridemia: Secondary | ICD-10-CM

## 2018-07-18 DIAGNOSIS — J449 Chronic obstructive pulmonary disease, unspecified: Secondary | ICD-10-CM

## 2018-07-18 DIAGNOSIS — Z7901 Long term (current) use of anticoagulants: Secondary | ICD-10-CM

## 2018-07-18 DIAGNOSIS — F32A Depression, unspecified: Secondary | ICD-10-CM

## 2018-07-18 DIAGNOSIS — F329 Major depressive disorder, single episode, unspecified: Secondary | ICD-10-CM

## 2018-07-18 LAB — POCT URINALYSIS DIPSTICK
Bilirubin, UA: NEGATIVE
Blood, UA: NEGATIVE
Glucose, UA: NEGATIVE
Ketones, UA: NEGATIVE
Leukocytes, UA: NEGATIVE
NITRITE UA: NEGATIVE
PROTEIN UA: NEGATIVE
Spec Grav, UA: 1.01 (ref 1.010–1.025)
UROBILINOGEN UA: 0.2 U/dL
pH, UA: 6 (ref 5.0–8.0)

## 2018-07-18 NOTE — Patient Instructions (Signed)

## 2018-07-18 NOTE — Progress Notes (Signed)
BP 114/60 (BP Location: Left Arm, Patient Position: Sitting, Cuff Size: Normal)   Pulse 67   Temp 98.6 F (37 C)   Ht 5\' 11"  (1.803 m)   Wt (!) 474 lb 8 oz (215.2 kg)   SpO2 97%   BMI 66.18 kg/m    Subjective:    Patient ID: Reginald [name], male    DOB: June 16, 1970, 48 y.o.   MRN: [id number]  HPI: Reginald [name] is a 48 y.o. male presenting on 07/18/2018 for Follow-up   HPI   Reginald [name] to cardiology in February for follow up of aortic valve replacement and thoracic aortic aneurysm repair with development of post-op a-fib.  Additionally he had complete heart block and has had a pacemaker placed.  No CAD by Reginald [name] in feb 2019 He is on xarelto for PE but may need lifelong therapy if he has afib recurrence   He has Gained 8 pounds since his Office Visit here on  12/30  [facility]  He is using his dulera- he says he has not noticed much change since starting it.  He quit smoking April 2019   Relevant past medical, surgical, family and social history reviewed and updated as indicated. Interim medical history since our last visit reviewed. Allergies and medications reviewed and updated.   Current Outpatient Medications:  .  acetaminophen (TYLENOL) 325 MG tablet, Take 650 mg by mouth every 6 (six) hours as needed for mild pain., Disp: , Rfl:  .  albuterol (PROVENTIL HFA;VENTOLIN HFA) 108 (90 Base) MCG/ACT inhaler, Inhale 2 puffs into the lungs every 6 (six) hours as needed for wheezing or shortness of breath. , Disp: , Rfl:  .  albuterol (PROVENTIL) (2.5 MG/3ML) 0.083% nebulizer solution, Take 6 mLs (5 mg total) by nebulization every 6 (six) hours as needed for wheezing or shortness of breath., Disp: 225 mL, Rfl: 1 .  allopurinol (ZYLOPRIM) 300 MG tablet, Take 1 tablet (300 mg total) by mouth daily., Disp: 90 tablet, Rfl: 1 .  citalopram (CELEXA) 20 MG tablet, Take 1 tablet (20 mg total) by mouth daily., Disp: 30 tablet, Rfl: 0 .  clonazePAM (KLONOPIN) 1 MG tablet, Take  0.5-1 mg by mouth every 8 (eight) hours as needed for anxiety., Disp: , Rfl:  .  docusate sodium (COLACE) 100 MG capsule, Take 200 mg by mouth daily as needed for mild constipation. , Disp: , Rfl:  .  lisinopril (PRINIVIL,ZESTRIL) 20 MG tablet, Take 1 tablet (20 mg total) by mouth daily., Disp: 90 tablet, Rfl: 1 .  metoprolol tartrate (LOPRESSOR) 100 MG tablet, Take 1 tablet (100 mg total) by mouth 2 (two) times daily., Disp: 180 tablet, Rfl: 0 .  mometasone-formoterol (DULERA) 200-5 MCG/ACT AERO, Inhale 2 puffs into the lungs 2 (two) times daily., Disp: 3 Inhaler, Rfl: 0 .  potassium chloride (K-DUR,KLOR-CON) 10 MEQ tablet, Take 1 tablet (10 mEq total) by mouth daily., Disp: 90 tablet, Rfl: 0 .  rivaroxaban (XARELTO) 20 MG TABS tablet, Take 1 tablet (20 mg total) by mouth daily with supper., Disp: 90 tablet, Rfl: 0 .  torsemide (DEMADEX) 20 MG tablet, Take 1 tablet (20 mg total) by mouth 2 (two) times daily., Disp: 180 tablet, Rfl: 3 .  ipratropium (ATROVENT) 0.02 % nebulizer solution, Take 0.5 mg by nebulization every 6 (six) hours as needed for wheezing or shortness of breath., Disp: , Rfl:    Review of Systems  Constitutional: Positive for fatigue. Negative for appetite change, chills, diaphoresis,  fever and unexpected weight change.  HENT: Negative for congestion, dental problem, drooling, ear pain, facial swelling, hearing loss, mouth sores, sneezing, sore throat, trouble swallowing and voice change.   Eyes: Negative for pain, discharge, redness, itching and visual disturbance.  Respiratory: Positive for shortness of breath and wheezing. Negative for cough and choking.   Cardiovascular: Positive for chest pain, palpitations and leg swelling.  Gastrointestinal: Negative for abdominal pain, blood in stool, constipation, diarrhea and vomiting.  Endocrine: Negative for cold intolerance, heat intolerance and polydipsia.  Genitourinary: Negative for decreased urine volume, dysuria and hematuria.   Musculoskeletal: Negative for arthralgias, back pain and gait problem.  Skin: Negative for rash.  Allergic/Immunologic: Negative for environmental allergies.  Neurological: Positive for light-headedness. Negative for seizures, syncope and headaches.  Hematological: Negative for adenopathy.  Psychiatric/Behavioral: Positive for agitation and dysphoric mood. Negative for suicidal ideas. The patient is nervous/anxious.     Per HPI unless specifically indicated above     Objective:    BP 114/60 (BP Location: Left Arm, Patient Position: Sitting, Cuff Size: Normal)   Pulse 67   Temp 98.6 F (37 C)   Ht 5\' 11"  (1.803 m)   Wt (!) 474 lb 8 oz (215.2 kg)   SpO2 97%   BMI 66.18 kg/m   Wt Readings from Last 3 Encounters:  07/18/18 (!) 474 lb 8 oz (215.2 kg)  07/05/18 (!) 465 lb (210.9 kg)  06/06/18 (!) 476 lb (215.9 kg)    Physical Exam Vitals signs reviewed.  Constitutional:      Appearance: He is well-developed. He is obese.  HENT:     Head: Normocephalic and atraumatic.  Neck:     Musculoskeletal: Neck supple.  Cardiovascular:     Rate and Rhythm: Normal rate and regular rhythm.  Pulmonary:     Effort: Pulmonary effort is normal.     Breath sounds: Normal breath sounds. No wheezing.  Abdominal:     General: Bowel sounds are normal.     Palpations: Abdomen is soft.     Tenderness: There is no abdominal tenderness.  Musculoskeletal:     Comments: BLE are obese but no edema, no erythema  Lymphadenopathy:     Cervical: No cervical adenopathy.  Skin:    General: Skin is warm and dry.  Neurological:     Mental Status: He is alert and oriented to person, place, and time.  Psychiatric:        Behavior: Behavior normal.     Results for orders placed or performed during the hospital encounter of 07/05/18  Hemoglobin A1c  Result Value Ref Range   Hgb A1c MFr Bld 5.9 (H) 4.8 - 5.6 %   Mean Plasma Glucose 122.63 mg/dL  Lipid panel  Result Value Ref Range   Cholesterol 165  0 - 200 mg/dL   Triglycerides 214 (H) <150 mg/dL   HDL 27 (L) >40 mg/dL   Total CHOL/HDL Ratio 6.1 RATIO   VLDL 43 (H) 0 - 40 mg/dL   LDL Cholesterol 95 0 - 99 mg/dL  Comprehensive metabolic panel  Result Value Ref Range   Sodium 139 135 - 145 mmol/L   Potassium 3.7 3.5 - 5.1 mmol/L   Chloride 102 98 - 111 mmol/L   CO2 26 22 - 32 mmol/L   Glucose, Bld 118 (H) 70 - 99 mg/dL   BUN 16 6 - 20 mg/dL   Creatinine, Ser 0.87 0.61 - 1.24 mg/dL   Calcium 9.0 8.9 - 10.3 mg/dL  Total Protein 8.0 6.5 - 8.1 g/dL   Albumin 3.9 3.5 - 5.0 g/dL   AST 49 (H) 15 - 41 U/L   ALT 41 0 - 44 U/L   Alkaline Phosphatase 73 38 - 126 U/L   Total Bilirubin 1.1 0.3 - 1.2 mg/dL   GFR calc non Af Amer >60 >60 mL/min   GFR calc Af Amer >60 >60 mL/min   Anion gap 11 5 - 15      Assessment & Plan:   Encounter Diagnoses  Name Primary?  . Essential hypertension Yes  . Super-super obese ([place])   . Urinary frequency   . Prediabetes   . Hypertriglyceridemia   . BMI 60.0-69.9, adult ([place])   . Chronic diastolic congestive heart failure ([place])   . Chronic obstructive pulmonary disease, unspecified COPD type ([place])   . Anticoagulant long-term use   . 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   . Depression, unspecified depression type     -Counseled extensively on need for weight loss to get improvement in his medical conditions.  Discussed that his obesity is likely contributing to his SOB -pt to continue current medications -counseled on lowfat diet and prediabetes -pt to follow up here 4 months.  RTO sooner prn

## 2018-07-24 ENCOUNTER — Encounter: Payer: Self-pay | Admitting: Nutrition

## 2018-07-24 ENCOUNTER — Encounter: Payer: Self-pay | Attending: Physician Assistant | Admitting: Nutrition

## 2018-07-24 DIAGNOSIS — R739 Hyperglycemia, unspecified: Secondary | ICD-10-CM | POA: Insufficient documentation

## 2018-07-24 NOTE — Progress Notes (Signed)
  Medical Nutrition Therapy:  Appt start time: 1030 end time:  1100  Assessment:  Primary concerns today:  Morbid Obesity. Seen at the  [facility]. Impaired fasting glucose, HTN. DId go to therapist at [facility] Saw Cardiologist and he reports his heart is fine. Gets SOB easily. Lost 7 lbs since last visit. A1C 5.9%. He notes he has been working on eating better food choices an cutting out processed foods and salty foods..  Ate only green beans last night for dinner. SKipped lunch. Meals are inconsistent and not balanced to meet his needs.  Limited fiances which limit him to access of foods. Uses coupons to save money on his foods. Hasn't been able to walk much. Willing to walk more in house.  CMP Latest Ref Rng & Units 07/05/2018 05/06/2018 04/23/2018  Glucose 70 - 99 mg/dL 118(H) 123(H) 157(H)  BUN 6 - 20 mg/dL 16 21(H) 19  Creatinine 0.61 - 1.24 mg/dL 0.87 0.93 0.95  Sodium 135 - 145 mmol/L 139 138 136  Potassium 3.5 - 5.1 mmol/L 3.7 4.1 4.9  Chloride 98 - 111 mmol/L 102 103 104  CO2 22 - 32 mmol/L 26 24 21(L)  Calcium 8.9 - 10.3 mg/dL 9.0 8.7(L) 9.0  Total Protein 6.5 - 8.1 g/dL 8.0 7.9 -  Total Bilirubin 0.3 - 1.2 mg/dL 1.1 1.2 -  Alkaline Phos 38 - 126 U/L 73 77 -  AST 15 - 41 U/L 49(H) 57(H) -  ALT 0 - 44 U/L 41 42 -     Preferred Learning Style:   No preference indicated   Learning Readiness:   Ready  Change in progress   MEDICATIONS:    DIETARY INTAKE: 24-hr recall:  B ( AM): 1 packet oatmeal, egg and apple sauce, water Snk ( AM): L ( PM):  skipped Snk ( PM): D ( PM): Green beans. Snk ( PM): yofurt 2  And 3 boiled eggs.  Beverages: SF lemonade, water 2-3 bottles of water per day  Usual physical activity:ADL  Estimated energy needs: 1600  calories 180 g carbohydrates 120 g protein 44 g fat  Progress Towards Goal(s):  In progress.   Nutritional Diagnosis:  NI-1.5 Excessive energy intake As related to high calorie high fat high salt diet.  As evidenced  by BMI 66 .    Intervention:  Nutrition and prediabetes education provided on My Plate, CHO counting, meal planning, portion sizes, timing of meals, avoiding snacks between meals  taking medications as prescribed, benefits of exercising 30 minutes per day and prevention of  DM. Healthy weight loss habits. Low sodium high fiber diet.   Goals  Eat three  Balance meals Eat protein and 2 carb choices  Walk 30 minutes daily  Teaching Method Utilized:  Visual Auditory Hands on  Handouts given during visit include:  The Plate Method   Meal Plan Card   Barriers to learning/adherence to lifestyle change: Morbid obesity and no income or job  Demonstrated degree of understanding via:  Teach Back   Monitoring/Evaluation:  Dietary intake, exercise, meal plannign , and body weight in 3 month(s).

## 2018-07-24 NOTE — Patient Instructions (Addendum)
Goals  Eat three  Balance meals Eat protein and 2 carb choices and veggies per meal  Walk 30 minutes daily

## 2018-08-08 ENCOUNTER — Other Ambulatory Visit: Payer: Self-pay | Admitting: Physician Assistant

## 2018-08-28 ENCOUNTER — Ambulatory Visit (INDEPENDENT_AMBULATORY_CARE_PROVIDER_SITE_OTHER): Payer: Self-pay | Admitting: *Deleted

## 2018-08-28 ENCOUNTER — Other Ambulatory Visit: Payer: Self-pay

## 2018-08-28 DIAGNOSIS — I442 Atrioventricular block, complete: Secondary | ICD-10-CM

## 2018-08-28 LAB — CUP PACEART REMOTE DEVICE CHECK
Battery Remaining Longevity: 120 mo
Battery Remaining Percentage: 95.5 %
Battery Voltage: 3.02 V
Brady Statistic AP VP Percent: 1 %
Brady Statistic AP VS Percent: 3.8 %
Brady Statistic AS VP Percent: 1.4 %
Brady Statistic AS VS Percent: 95 %
Brady Statistic RA Percent Paced: 3.8 %
Brady Statistic RV Percent Paced: 1.4 %
Date Time Interrogation Session: 20200408062508
Implantable Lead Implant Date: 20190409
Implantable Lead Implant Date: 20190409
Implantable Lead Location: 753859
Implantable Lead Location: 753860
Implantable Pulse Generator Implant Date: 20190409
Lead Channel Impedance Value: 510 Ohm
Lead Channel Impedance Value: 560 Ohm
Lead Channel Pacing Threshold Amplitude: 0.5 V
Lead Channel Pacing Threshold Amplitude: 0.5 V
Lead Channel Pacing Threshold Pulse Width: 0.5 ms
Lead Channel Pacing Threshold Pulse Width: 0.5 ms
Lead Channel Sensing Intrinsic Amplitude: 4.6 mV
Lead Channel Sensing Intrinsic Amplitude: 5.5 mV
Lead Channel Setting Pacing Amplitude: 2 V
Lead Channel Setting Pacing Amplitude: 2.5 V
Lead Channel Setting Pacing Pulse Width: 0.5 ms
Lead Channel Setting Sensing Sensitivity: 2 mV
Pulse Gen Model: 2272
Pulse Gen Serial Number: 9010865

## 2018-09-05 ENCOUNTER — Telehealth: Payer: Self-pay

## 2018-09-05 ENCOUNTER — Encounter (HOSPITAL_COMMUNITY): Payer: Self-pay | Admitting: Physician Assistant

## 2018-09-05 ENCOUNTER — Other Ambulatory Visit: Payer: Self-pay

## 2018-09-05 ENCOUNTER — Ambulatory Visit (HOSPITAL_COMMUNITY)
Admission: RE | Admit: 2018-09-05 | Discharge: 2018-09-05 | Disposition: A | Discharge status: Home / Self Care | Payer: Self-pay | Source: Ambulatory Visit | Attending: Physician Assistant | Admitting: Physician Assistant

## 2018-09-05 VITALS — HR 73 | Ht 71.0 in | Wt >= 6400 oz

## 2018-09-05 DIAGNOSIS — Z950 Presence of cardiac pacemaker: Secondary | ICD-10-CM

## 2018-09-05 DIAGNOSIS — Z9889 Other specified postprocedural states: Secondary | ICD-10-CM

## 2018-09-05 DIAGNOSIS — I5033 Acute on chronic diastolic (congestive) heart failure: Secondary | ICD-10-CM

## 2018-09-05 DIAGNOSIS — I38 Endocarditis, valve unspecified: Secondary | ICD-10-CM

## 2018-09-05 DIAGNOSIS — I480 Paroxysmal atrial fibrillation: Secondary | ICD-10-CM

## 2018-09-05 DIAGNOSIS — I442 Atrioventricular block, complete: Secondary | ICD-10-CM

## 2018-09-05 NOTE — Telephone Encounter (Signed)
Spoke with patient. He feels okay right now, sitting. Overnight felt his heart racing, felt anxious, some chest discomfort. Used his pulse oximeter and noted HRs in the 160s when walking to the bathroom last night. Has no way to check his BP, but denies any dizziness with position changes right now. Reports compliance with cardiac meds, including lisinopril, metoprolol tartrate, and Xarelto. No missed doses.  Advised pt I will discuss with provider for recommendations. He is agreeable to plan. Per Chanetta Marshall, NP, plan for virtual visit with [facility] today. Will route this message.  Presenting rhythm as of 09:38 manual transmission shows AF/Vs 70s-120s. 2 HVR episodes from 09/04/18 at 23:52 (54min 36sec @ 169bpm) and 09/05/18 at 04:23 (38sec @ 164bpm) show AF w/RVR.

## 2018-09-05 NOTE — Progress Notes (Signed)
Electrophysiology TeleHealth Note   Due to national recommendations of social distancing due to [place] 19, Audio/video telehealth visit is felt to be most appropriate for this patient at this time.  See MyChart message from today for patient consent regarding telehealth for the Atrial Fibrillation Clinic.    Date:  09/05/2018   ID:  Reginald [name], DOB 1970/09/13, MRN [id number]  Location: home  Provider location: [address] [place], [place] [address] Evaluation Performed: Follow up  PCP:  [name], PA-C  Primary Cardiologist:  Dr Bronson Ing Primary Electrophysiologist: Dr Rayann Heman   CC: Follow up for atrial fibrillation/SOB   History of Present Illness: Reginald [name] is a 48 y.o. male who presents via audio/video conferencing for a telehealth visit today.  Patient reports that over the last month he has had weight gain (about 15 lbs), abdominal swelling, and more SOB and fatigue. Last evening and overnight, he felt his heart racing intermittently associated with chest pain and fatigue. He sent in a manual transmission to the device clinic and he was noted to have episodes of afib with RVR. He admits that he has been eating more processed foods and foods high in sodium over the last month. He also reports that he drinks almost one gallon of water daily. Unfortunately, he admits he has missed several doses of Xarelto over the last three weeks.  Today, he denies symptoms of orthopnea, PND, lower extremity edema, claudication, dizziness, presyncope, syncope, bleeding, or neurologic sequela. The patient is tolerating medications without difficulties and is otherwise without complaint today.   he denies symptoms of cough, fevers, chills, or new SOB worrisome for COVID 19.    Atrial Fibrillation Risk Factors:  he does have symptoms or diagnosis of sleep apnea. he is compliant with CPAP therapy. he does not have a history of rheumatic fever.   he has a BMI of Body mass index is  67.64 kg/m.Marland Kitchen Filed Weights   09/05/18 1129  Weight: (!) 220 kg    Past Medical History:  Diagnosis Date  . Anxiety   . Aortic stenosis   . Asthma   . Back pain   . Cellulitis of skin with lymphangitis   . CHF (congestive heart failure) ([place])   . Chronic diastolic congestive heart failure ([place])   . Chronic venous insufficiency   . Constipation   . COPD (chronic obstructive pulmonary disease) ([place])   . Dyspnea   . Essential hypertension   . Gout   . Hypertension   . Morbid obesity ([place])   . Obesity   . Pneumonia    walking pneumonia  . Prostatitis   . Pulmonary hypertension ([place])   . S/P aortic valve replacement with bioprosthetic valve 08/23/2017   25 mm Edwards Inspiris Resilia stented bovine pericardial tissue valve  . 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 Sleep apnea    cpap   . Thoracic ascending aortic aneurysm ([place])   . Thoracic ascending aortic aneurysm ([place])   . Tobacco abuse    Past Surgical History:  Procedure Laterality Date  . AORTIC VALVE REPLACEMENT N/A 08/23/2017   Procedure: AORTIC VALVE REPLACEMENT (AVR) USING INSPIRIS RESILIA AORTIC VALVE SIZE 25 MM;  Surgeon: Rexene Alberts, MD;  Location: [facility];  Service: Open Heart Surgery;  Laterality: N/A;  . MULTIPLE EXTRACTIONS WITH ALVEOLOPLASTY N/A 07/23/2017   Procedure: Extraction of tooth #10 with alveoloplasty and gross debridement of remaining teeth;  Surgeon: Lenn Cal, DDS;  Location: [facility];  Service: Oral Surgery;  Laterality: N/A;  . NO PAST SURGERIES    . PACEMAKER IMPLANT N/A 08/28/2017    St Jude Medical Assurity MRI conditional  dual-chamber pacemaker for symptomatic complete heart blockby Dr Rayann Heman  . TEE WITHOUT CARDIOVERSION N/A 07/16/2017   Procedure: TRANSESOPHAGEAL ECHOCARDIOGRAM (TEE);  Surgeon: Lelon Perla, MD;  Location: [facility] ENDOSCOPY;  Service: Cardiovascular;  Laterality: N/A;  . TEE WITHOUT CARDIOVERSION N/A 08/23/2017    Procedure: TRANSESOPHAGEAL ECHOCARDIOGRAM (TEE);  Surgeon: Rexene Alberts, MD;  Location: [facility];  Service: Open Heart Surgery;  Laterality: N/A;  . THORACIC AORTIC ANEURYSM REPAIR N/A 08/23/2017   Procedure: THORACIC ASCENDING ANEURYSM REPAIR (AAA) USING HEMASHIELD GOLD KNITTED MICROVEL DOUBLE VELOUR VASCULAR GRAFT D: 24 MM  L: 30 CM;  Surgeon: Rexene Alberts, MD;  Location: [facility];  Service: Open Heart Surgery;  Laterality: N/A;     Current Outpatient Medications  Medication Sig Dispense Refill  . acetaminophen (TYLENOL) 325 MG tablet Take 650 mg by mouth every 6 (six) hours as needed for mild pain.    Marland Kitchen albuterol (PROVENTIL HFA;VENTOLIN HFA) 108 (90 Base) MCG/ACT inhaler Inhale 2 puffs into the lungs every 6 (six) hours as needed for wheezing or shortness of breath.     Marland Kitchen albuterol (PROVENTIL) (2.5 MG/3ML) 0.083% nebulizer solution INHALE 1 VIAL VIA NEBULIZER EVERY 6 HOURS AS NEEDED FOR FOR WHEEZING OR SHORTNESS OF BREATH 270 mL 1  . allopurinol (ZYLOPRIM) 300 MG tablet Take 1 tablet (300 mg total) by mouth daily. 90 tablet 1  . citalopram (CELEXA) 20 MG tablet Take 1 tablet (20 mg total) by mouth daily. 30 tablet 0  . docusate sodium (COLACE) 100 MG capsule Take 200 mg by mouth daily as needed for mild constipation.     . DULERA 200-5 MCG/ACT AERO INHALE 2 PUFFS BY MOUTH TWICE DAILY. RINSE MOUTH AFTER EACH USE 3 Inhaler 1  . ipratropium (ATROVENT) 0.02 % nebulizer solution Take 0.5 mg by nebulization every 6 (six) hours as needed for wheezing or shortness of breath.    . lisinopril (PRINIVIL,ZESTRIL) 20 MG tablet Take 1 tablet (20 mg total) by mouth daily. 90 tablet 1  . metoprolol tartrate (LOPRESSOR) 100 MG tablet TAKE 1 Tablet  BY MOUTH TWICE DAILY 180 tablet 0  . potassium chloride (MICRO-K) 10 MEQ CR capsule TAKE 1 Capsule BY MOUTH ONCE DAILY 90 capsule 0  . torsemide (DEMADEX) 20 MG tablet Take 1 tablet (20 mg total) by mouth 2 (two) times daily. 180 tablet 3  . XARELTO 20 MG TABS  tablet TAKE 1 Tablet BY MOUTH ONCE DAILY WITH SUPPER 90 tablet 0   No current facility-administered medications for this encounter.     Allergies:   Patient has no known allergies.   Social History:  The patient  reports that he quit smoking about a year ago. His smoking use included cigarettes. He smoked 0.50 packs per day. He has never used smokeless tobacco. He reports that he does not drink alcohol or use drugs.   Family History:  [name]  family history includes Alzheimer's disease in his mother; Diabetes in his brother; Heart attack in his brother; Hypertension in his mother.    ROS:  Please see the history of present illness.   All other systems are personally reviewed and negative.   Exam: Well appearing, alert and conversant, regular work of breathing,  good skin color  Recent Labs: 04/22/2018: TSH  4.717 05/06/2018: B Natriuretic Peptide 113.0; Hemoglobin 13.3; Platelets 266 07/05/2018: ALT 41; BUN 16; Creatinine, Ser 0.87; Potassium 3.7; Sodium 139  personally reviewed    Other studies personally reviewed: Additional studies/ records that were reviewed today include: Epic notes, echocardiogram  Echo 03/22/18 - Left ventricle: Extremely limited images despite use of Definity   contrast. Based on very limited views of the left ventricle, LVEF   looks to be grossly normal range. The cavity size was normal. The   estimated ejection fraction was 55%. Left ventricular diastolic   function parameters were normal. - Aortic valve: Reported to be status post placement of 25 mm   Edwards Inspiris Resilia stented bovine pericardial tissue valve.   Structure is poorly visualized. Mildly to moderately calcified   annulus. There was no regurgitation. Mean gradient (S): 23 mm Hg. - Mitral valve: Mildly calcified annulus. There was trivial   regurgitation. - Right ventricle: Poorly visualized. The cavity size was mildly   dilated. Reported to have pacemaker in place - cannot  visualize   wire except to limited degree in the right atrium.. - Tricuspid valve: Poorly visualized. There was trivial   regurgitation. - Pulmonary arteries: Systolic pressure could not be accurately   estimated. - Inferior vena cava: Not visualized. - Pericardium, extracardiac: There was no pericardial effusion.    ASSESSMENT AND PLAN:  1. Paroxysmal atrial fibrillation Overall burden low 1.9% on last remote transmission. Suspect 2/2 fluid overload.  We discussed therapeutic options including TEE/DCCV vs three weeks of anticoagulation followed by DCCV vs rate control. Patient prefers to hold off on cardioversion at this point. Will address CHF and see if afib resolves. If he is persistent, then we will plan for cardioversion. Patient is in agreement with plan. Continue metoprolol 100 mg BID Continue Xarelto 20 mg daily. Stressed importance of anticoagulation. Patient voices understanding.  This patients CHA2DS2-VASc Score and unadjusted Ischemic Stroke Rate (% per year) is equal to 2.2 % stroke rate/year from a score of 2  Above score calculated as 1 point each if present [CHF, HTN, DM, Vascular=MI/PAD/Aortic Plaque, Age if 65-74, or Male] Above score calculated as 2 points each if present [Age > 75, or Stroke/TIA/TE]  2. Acute on chronic diastolic CHF Patient sdmits he has eaten more foods high in sodium since COVID-19 precautions. He also consumes a significant amount of water daily. He has abdominal bloating and 15 lbs weight gain. Will increase torsemide to 40 mg AM and 20 mg PM for the next 3 days. Encouraged pt to decrease water intake. 2 g sodium diet encouraged.  3. Complete heart block S/p PPM, followed by device clinic and Dr Rayann Heman.  4. Aortic valve disease S/p AV surgery with bioprosthetic valve.  COVID screen The patient does not have any symptoms that suggest any further testing/ screening at this time.  Social distancing reinforced today.    Follow-up with AF  clinic next week. Patient given precautions to contact our office or go to ER.   Current medicines are reviewed at length with the patient today.   The patient does not have concerns regarding his medicines.  The following changes were made today:  Increase torsemide for 3 days  Labs/ tests ordered today include:  No orders of the defined types were placed in this encounter.   Patient Risk:  after full review of this patients clinical status, I feel that they are at moderate risk at this time.  Today, I have spent 15 minutes with the  patient with telehealth technology discussing atrial fibrillation, lifestyle modifications, and COVID-19 precautions.    Gwenlyn Perking PA-C 09/05/2018 12:13 PM  Afib [facility] [address] [place], [place] [address] [phone]

## 2018-09-05 NOTE — Telephone Encounter (Signed)
Pt states his heart rate been all over the place. He sent a manual transmission with his home monitor. He would like a nurse to take a look at it and give him a call back as soon as possible. The best number to reach the patient is [phone]. Transmission has been received

## 2018-09-06 ENCOUNTER — Encounter: Payer: Self-pay | Admitting: Cardiology

## 2018-09-06 NOTE — Progress Notes (Signed)
Remote pacemaker transmission.   

## 2018-09-09 ENCOUNTER — Ambulatory Visit (HOSPITAL_COMMUNITY)
Admission: RE | Admit: 2018-09-09 | Discharge: 2018-09-09 | Disposition: A | Discharge status: Home / Self Care | Payer: Self-pay | Source: Ambulatory Visit | Attending: Physician Assistant | Admitting: Physician Assistant

## 2018-09-09 ENCOUNTER — Encounter (HOSPITAL_COMMUNITY): Payer: Self-pay | Admitting: Physician Assistant

## 2018-09-09 ENCOUNTER — Other Ambulatory Visit: Payer: Self-pay

## 2018-09-09 VITALS — HR 71 | Temp 98.1°F | Ht 71.0 in | Wt >= 6400 oz

## 2018-09-09 DIAGNOSIS — I4819 Other persistent atrial fibrillation: Secondary | ICD-10-CM

## 2018-09-09 DIAGNOSIS — Z950 Presence of cardiac pacemaker: Secondary | ICD-10-CM

## 2018-09-09 DIAGNOSIS — Z6841 Body Mass Index (BMI) 40.0 and over, adult: Secondary | ICD-10-CM

## 2018-09-09 DIAGNOSIS — I442 Atrioventricular block, complete: Secondary | ICD-10-CM

## 2018-09-09 DIAGNOSIS — I358 Other nonrheumatic aortic valve disorders: Secondary | ICD-10-CM

## 2018-09-09 DIAGNOSIS — I5033 Acute on chronic diastolic (congestive) heart failure: Secondary | ICD-10-CM

## 2018-09-09 DIAGNOSIS — I480 Paroxysmal atrial fibrillation: Secondary | ICD-10-CM

## 2018-09-09 NOTE — Progress Notes (Signed)
Electrophysiology TeleHealth Note   Due to national recommendations of social distancing due to [place] 19, Audio/video telehealth visit is felt to be most appropriate for this patient at this time.  See MyChart message from today for patient consent regarding telehealth for the Atrial Fibrillation Clinic.    Date:  09/09/2018   ID:  Reginald [name], DOB December 02, 1970, MRN [id number]  Location: home  Provider location: [address] [place], [place] [address] Evaluation Performed: Follow up  PCP:  [name], PA-C  Primary Cardiologist:  Dr Bronson Ing Primary Electrophysiologist: Dr Rayann Heman   CC: Follow up for atrial fibrillation/SOB   History of Present Illness: Reginald [name] is a 48 y.o. male who presents via audio/video conferencing for a telehealth visit today.  Patient reports that over the last month he has had weight gain (about 15 lbs), abdominal swelling, and more SOB and fatigue. On 4/15-4/16 he felt his heart racing intermittently associated with chest pain and fatigue. He sent in a manual transmission to the device clinic and he was noted to have episodes of afib with RVR.   On follow up today, patient reports that his SOB has not improved and his weight has not changed. His pulse oximeter continues to show rapid rates at times, especially on exertion with associated SOB. He can confidently say that he has not missed any Xarelto since 08/31/18.  Today, he denies symptoms of orthopnea, PND, claudication, dizziness, presyncope, syncope, bleeding, or neurologic sequela. The patient is tolerating medications without difficulties and is otherwise without complaint today.   he denies symptoms of cough, fevers, chills, or new SOB worrisome for COVID 19.    Atrial Fibrillation Risk Factors:  he does have symptoms or diagnosis of sleep apnea. he is compliant with CPAP therapy. he does not have a history of rheumatic fever.   he has a BMI of Body mass index is 67.78 kg/m.Marland Kitchen  Filed Weights   09/09/18 1114  Weight: (!) 220.4 kg    Past Medical History:  Diagnosis Date  . Anxiety   . Aortic stenosis   . Asthma   . Back pain   . Cellulitis of skin with lymphangitis   . CHF (congestive heart failure) ([place])   . Chronic diastolic congestive heart failure ([place])   . Chronic venous insufficiency   . Constipation   . COPD (chronic obstructive pulmonary disease) ([place])   . Dyspnea   . Essential hypertension   . Gout   . Hypertension   . Morbid obesity ([place])   . Obesity   . Pneumonia    walking pneumonia  . Prostatitis   . Pulmonary hypertension ([place])   . S/P aortic valve replacement with bioprosthetic valve 08/23/2017   25 mm Edwards Inspiris Resilia stented bovine pericardial tissue valve  . 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 Sleep apnea    cpap   . Thoracic ascending aortic aneurysm ([place])   . Thoracic ascending aortic aneurysm ([place])   . Tobacco abuse    Past Surgical History:  Procedure Laterality Date  . AORTIC VALVE REPLACEMENT N/A 08/23/2017   Procedure: AORTIC VALVE REPLACEMENT (AVR) USING INSPIRIS RESILIA AORTIC VALVE SIZE 25 MM;  Surgeon: Rexene Alberts, MD;  Location: [facility];  Service: Open Heart Surgery;  Laterality: N/A;  . MULTIPLE EXTRACTIONS WITH ALVEOLOPLASTY N/A 07/23/2017   Procedure: Extraction of tooth #10 with alveoloplasty and gross debridement of remaining teeth;  Surgeon: Enrique Sack,  Pete Glatter, DDS;  Location: [facility];  Service: Oral Surgery;  Laterality: N/A;  . NO PAST SURGERIES    . PACEMAKER IMPLANT N/A 08/28/2017    St Jude Medical Assurity MRI conditional  dual-chamber pacemaker for symptomatic complete heart blockby Dr Rayann Heman  . TEE WITHOUT CARDIOVERSION N/A 07/16/2017   Procedure: TRANSESOPHAGEAL ECHOCARDIOGRAM (TEE);  Surgeon: Lelon Perla, MD;  Location: [facility] ENDOSCOPY;  Service: Cardiovascular;  Laterality: N/A;  . TEE WITHOUT CARDIOVERSION N/A 08/23/2017   Procedure:  TRANSESOPHAGEAL ECHOCARDIOGRAM (TEE);  Surgeon: Rexene Alberts, MD;  Location: [facility];  Service: Open Heart Surgery;  Laterality: N/A;  . THORACIC AORTIC ANEURYSM REPAIR N/A 08/23/2017   Procedure: THORACIC ASCENDING ANEURYSM REPAIR (AAA) USING HEMASHIELD GOLD KNITTED MICROVEL DOUBLE VELOUR VASCULAR GRAFT D: 24 MM  L: 30 CM;  Surgeon: Rexene Alberts, MD;  Location: [facility];  Service: Open Heart Surgery;  Laterality: N/A;     Current Outpatient Medications  Medication Sig Dispense Refill  . acetaminophen (TYLENOL) 325 MG tablet Take 650 mg by mouth every 6 (six) hours as needed for mild pain.    Marland Kitchen albuterol (PROVENTIL HFA;VENTOLIN HFA) 108 (90 Base) MCG/ACT inhaler Inhale 2 puffs into the lungs every 6 (six) hours as needed for wheezing or shortness of breath.     Marland Kitchen albuterol (PROVENTIL) (2.5 MG/3ML) 0.083% nebulizer solution INHALE 1 VIAL VIA NEBULIZER EVERY 6 HOURS AS NEEDED FOR FOR WHEEZING OR SHORTNESS OF BREATH 270 mL 1  . allopurinol (ZYLOPRIM) 300 MG tablet Take 1 tablet (300 mg total) by mouth daily. 90 tablet 1  . citalopram (CELEXA) 20 MG tablet Take 1 tablet (20 mg total) by mouth daily. 30 tablet 0  . docusate sodium (COLACE) 100 MG capsule Take 200 mg by mouth daily as needed for mild constipation.     . DULERA 200-5 MCG/ACT AERO INHALE 2 PUFFS BY MOUTH TWICE DAILY. RINSE MOUTH AFTER EACH USE 3 Inhaler 1  . ipratropium (ATROVENT) 0.02 % nebulizer solution Take 0.5 mg by nebulization every 6 (six) hours as needed for wheezing or shortness of breath.    . lisinopril (PRINIVIL,ZESTRIL) 20 MG tablet Take 1 tablet (20 mg total) by mouth daily. 90 tablet 1  . metoprolol tartrate (LOPRESSOR) 100 MG tablet TAKE 1 Tablet  BY MOUTH TWICE DAILY 180 tablet 0  . potassium chloride (MICRO-K) 10 MEQ CR capsule TAKE 1 Capsule BY MOUTH ONCE DAILY 90 capsule 0  . torsemide (DEMADEX) 20 MG tablet Take 1 tablet (20 mg total) by mouth 2 (two) times daily. 180 tablet 3  . XARELTO 20 MG TABS tablet TAKE 1  Tablet BY MOUTH ONCE DAILY WITH SUPPER 90 tablet 0   No current facility-administered medications for this encounter.     Allergies:   Patient has no known allergies.   Social History:  The patient  reports that he quit smoking about 12 months ago. His smoking use included cigarettes. He smoked 0.50 packs per day. He has never used smokeless tobacco. He reports that he does not drink alcohol or use drugs.   Family History:  [name]  family history includes Alzheimer's disease in his mother; Diabetes in his brother; Heart attack in his brother; Hypertension in his mother.    ROS:  Please see the history of present illness.   All other systems are personally reviewed and negative.   Exam: Well appearing, alert and conversant, slightly labored breathing,  good skin color  Recent Labs: 04/22/2018: TSH 4.717 05/06/2018: B  Natriuretic Peptide 113.0; Hemoglobin 13.3; Platelets 266 07/05/2018: ALT 41; BUN 16; Creatinine, Ser 0.87; Potassium 3.7; Sodium 139  personally reviewed    Other studies personally reviewed: Additional studies/ records that were reviewed today include: Epic notes, echocardiogram  Echo 03/22/18 - Left ventricle: Extremely limited images despite use of Definity   contrast. Based on very limited views of the left ventricle, LVEF   looks to be grossly normal range. The cavity size was normal. The   estimated ejection fraction was 55%. Left ventricular diastolic   function parameters were normal. - Aortic valve: Reported to be status post placement of 25 mm   Edwards Inspiris Resilia stented bovine pericardial tissue valve.   Structure is poorly visualized. Mildly to moderately calcified   annulus. There was no regurgitation. Mean gradient (S): 23 mm Hg. - Mitral valve: Mildly calcified annulus. There was trivial   regurgitation. - Right ventricle: Poorly visualized. The cavity size was mildly   dilated. Reported to have pacemaker in place - cannot visualize    wire except to limited degree in the right atrium.. - Tricuspid valve: Poorly visualized. There was trivial   regurgitation. - Pulmonary arteries: Systolic pressure could not be accurately   estimated. - Inferior vena cava: Not visualized. - Pericardium, extracardiac: There was no pericardial effusion.    ASSESSMENT AND PLAN:  1. Paroxysmal atrial fibrillation Patient continues to have rapid HR at times, especially with exertion associated with SOB.  We discussed therapeutic options again including DCCV. He states that he has not missed any doses of anticoagulation since 08/31/18. On [place] remote check, he was in [place] when he missed a dose of anticoagulation. Per Dr Rayann Heman, OK to precede with DCCV without TEE. Will arrange for procedure.  Continue metoprolol 100 mg BID Continue Xarelto 20 mg daily with no missed doses.  This patients CHA2DS2-VASc Score and unadjusted Ischemic Stroke Rate (% per year) is equal to 2.2 % stroke rate/year from a score of 2  Above score calculated as 1 point each if present [CHF, HTN, DM, Vascular=MI/PAD/Aortic Plaque, Age if 65-74, or Male] Above score calculated as 2 points each if present [Age > 75, or Stroke/TIA/TE]  2. Acute on chronic diastolic CHF Patient reports that his SOB is unchanged. Suspect decompensated diastolic heart failure 2/2 afib. Plans as above. Continue torsemide and 2g sodium diet.  3. Complete heart block S/p PPM, followed by device clinic and Dr Rayann Heman.  4. Aortic valve disease S/p AV surgery with bioprosthetic valve.  5. Morbid obesity Body mass index is 67.78 kg/m.  Certainly contributing to his medical issues. Lifestyle modification was discussed and encouraged including regular physical activity and weight reduction. He is currently seeing nutrition. Would consider referral to bariatric surgery once COVID-19 precautions end.    COVID screen The patient does not have any symptoms that suggest any further testing/  screening at this time.  Social distancing reinforced today.    Follow-up AF clinic telehealth visit after cardioversion.   Current medicines are reviewed at length with the patient today.   The patient does not have concerns regarding his medicines.  The following changes were made today: Return to regular dose of torsemide.   Labs/ tests ordered today include:  No orders of the defined types were placed in this encounter.   Patient Risk:  after full review of this patients clinical status, I feel that they are at high risk at this time.  Today, I have spent 13 minutes with the patient with  telehealth technology discussing atrial fibrillation, cardioversion, lifestyle modifications, and COVID-19 precautions.    Gwenlyn Perking PA-C 09/09/2018 11:45 AM  Af[facility] [address] [place], [place] [address] [phone]

## 2018-09-10 ENCOUNTER — Telehealth (HOSPITAL_COMMUNITY): Payer: Self-pay | Admitting: *Deleted

## 2018-09-10 ENCOUNTER — Encounter (HOSPITAL_COMMUNITY): Payer: Self-pay | Admitting: *Deleted

## 2018-09-10 ENCOUNTER — Other Ambulatory Visit (HOSPITAL_COMMUNITY): Payer: Self-pay | Admitting: *Deleted

## 2018-09-10 ENCOUNTER — Encounter (HOSPITAL_COMMUNITY): Payer: Self-pay | Admitting: Anesthesiology

## 2018-09-10 ENCOUNTER — Other Ambulatory Visit: Payer: Self-pay

## 2018-09-10 NOTE — Anesthesia Preprocedure Evaluation (Deleted)
Anesthesia Evaluation    Reviewed: Allergy & Precautions, Patient's Chart, lab work & pertinent test results, reviewed documented beta blocker date and time   Airway Mallampati: IV       Dental   Pulmonary sleep apnea , COPD, former smoker,           Cardiovascular hypertension, Pt. on medications and Pt. on home beta blockers +CHF  + dysrhythmias Atrial Fibrillation + pacemaker + Valvular Problems/Murmurs   TEE 03/22/18 Left ventricle: Extremely limited images despite use of Definity   contrast. Based on very limited views of the left ventricle, LVEF   looks to be grossly normal range. The cavity size was normal. The   estimated ejection fraction was 55%. Left ventricular diastolic   function parameters were normal. - Aortic valve: Reported to be status post placement of 25 mm   Edwards Inspiris Resilia stented bovine pericardial tissue valve.   Structure is poorly visualized. Mildly to moderately calcified   annulus. There was no regurgitation. Mean gradient (S): 23 mm Hg. - Mitral valve: Mildly calcified annulus. There was trivial   regurgitation. - Right ventricle: Poorly visualized. The cavity size was mildly   dilated. Reported to have pacemaker in place - cannot visualize   wire except to limited degree in the right atrium..  PT S/P AVR 08/23/17   Neuro/Psych    GI/Hepatic GERD  ,  Endo/Other  Morbid obesity  Renal/GU      Musculoskeletal   Abdominal (+) + obese,   Peds  Hematology   Anesthesia Other Findings Pt on Xarelto  Reproductive/Obstetrics                            Anesthesia Physical Anesthesia Plan  ASA: IV  Anesthesia Plan: General   Post-op Pain Management:    Induction: Intravenous  PONV Risk Score and Plan: Treatment may vary due to age or medical condition  Airway Management Planned: Natural Airway and Nasal Cannula  Additional Equipment:   Intra-op Plan:    Post-operative Plan:   Informed Consent:   Plan Discussed with:   Anesthesia Plan Comments:         Anesthesia Quick Evaluation

## 2018-09-10 NOTE — Progress Notes (Signed)
Pt verbalized understanding of all pre-op instructions. 

## 2018-09-10 NOTE — Telephone Encounter (Signed)
Patient is scheduled for cardioversion on 4/22 at 69am with Dr. Rayann Heman.. Pt notified to arrive at main entrance at 630am, NPO after MN, sip of water for morning medications. Pt screened appropriately for covid-19 prior to admission. Denies fever within the last 14 days, no known exposure to any contacts with covid-19, no shortness of breath outside of SOB associated with AF, denies travel outside of [place] in last 14 days. Pt instructed to have driver stay on hospital premises for entire procedure time. IStat morning of procedure. Pt verbalized understanding of instructions. Follow up appt made.

## 2018-09-11 ENCOUNTER — Encounter (HOSPITAL_COMMUNITY)
Admission: RE | Disposition: A | Discharge status: Home / Self Care | Payer: Self-pay | Source: Home / Self Care | Attending: Internal Medicine

## 2018-09-11 ENCOUNTER — Encounter (HOSPITAL_COMMUNITY): Payer: Self-pay | Admitting: *Deleted

## 2018-09-11 ENCOUNTER — Ambulatory Visit (HOSPITAL_COMMUNITY)
Admission: RE | Admit: 2018-09-11 | Discharge: 2018-09-11 | Disposition: A | Discharge status: Home / Self Care | Payer: Self-pay | Attending: Internal Medicine | Admitting: Internal Medicine

## 2018-09-11 DIAGNOSIS — I272 Pulmonary hypertension, unspecified: Secondary | ICD-10-CM | POA: Insufficient documentation

## 2018-09-11 DIAGNOSIS — I5033 Acute on chronic diastolic (congestive) heart failure: Secondary | ICD-10-CM | POA: Insufficient documentation

## 2018-09-11 DIAGNOSIS — F419 Anxiety disorder, unspecified: Secondary | ICD-10-CM | POA: Insufficient documentation

## 2018-09-11 DIAGNOSIS — G4730 Sleep apnea, unspecified: Secondary | ICD-10-CM | POA: Insufficient documentation

## 2018-09-11 DIAGNOSIS — Z6841 Body Mass Index (BMI) 40.0 and over, adult: Secondary | ICD-10-CM | POA: Insufficient documentation

## 2018-09-11 DIAGNOSIS — I712 Thoracic aortic aneurysm, without rupture: Secondary | ICD-10-CM | POA: Insufficient documentation

## 2018-09-11 DIAGNOSIS — Z539 Procedure and treatment not carried out, unspecified reason: Secondary | ICD-10-CM | POA: Insufficient documentation

## 2018-09-11 DIAGNOSIS — I110 Hypertensive heart disease with heart failure: Secondary | ICD-10-CM | POA: Insufficient documentation

## 2018-09-11 DIAGNOSIS — Z7901 Long term (current) use of anticoagulants: Secondary | ICD-10-CM | POA: Insufficient documentation

## 2018-09-11 DIAGNOSIS — Z79899 Other long term (current) drug therapy: Secondary | ICD-10-CM | POA: Insufficient documentation

## 2018-09-11 DIAGNOSIS — Z87891 Personal history of nicotine dependence: Secondary | ICD-10-CM | POA: Insufficient documentation

## 2018-09-11 DIAGNOSIS — J449 Chronic obstructive pulmonary disease, unspecified: Secondary | ICD-10-CM | POA: Insufficient documentation

## 2018-09-11 DIAGNOSIS — I480 Paroxysmal atrial fibrillation: Secondary | ICD-10-CM | POA: Insufficient documentation

## 2018-09-11 DIAGNOSIS — I442 Atrioventricular block, complete: Secondary | ICD-10-CM | POA: Insufficient documentation

## 2018-09-11 DIAGNOSIS — I359 Nonrheumatic aortic valve disorder, unspecified: Secondary | ICD-10-CM | POA: Insufficient documentation

## 2018-09-11 HISTORY — DX: Major depressive disorder, single episode, unspecified: F32.9

## 2018-09-11 HISTORY — DX: Depression, unspecified: F32.A

## 2018-09-11 HISTORY — DX: Unspecified osteoarthritis, unspecified site: M19.90

## 2018-09-11 HISTORY — DX: Prediabetes: R73.03

## 2018-09-11 HISTORY — DX: Cardiac murmur, unspecified: R01.1

## 2018-09-11 HISTORY — DX: Other pulmonary embolism without acute cor pulmonale: I26.99

## 2018-09-11 SURGERY — CANCELLED PROCEDURE

## 2018-09-11 NOTE — H&P (Signed)
Presents today for cardioversion but in confirmed to be in sinus rhythm by ekg. Pt is discharged, without changes to prior medicines. AF clinic notified.  Thompson Grayer MD, [facility] 09/11/2018 9:01 AM

## 2018-09-11 NOTE — Progress Notes (Signed)
Patient admitted to Endo unit for cardioversion. Patient placed on monitor and appeared to be in NSR. EKG and MD confirmed NSR. Patient discharged to home with instructions to continue all medications and keep follow up appointments. Patient cancelled prior to entering procedure room.

## 2018-09-16 ENCOUNTER — Ambulatory Visit: Payer: Self-pay | Admitting: Thoracic Surgery (Cardiothoracic Vascular Surgery)

## 2018-09-18 ENCOUNTER — Encounter (HOSPITAL_COMMUNITY): Payer: Self-pay | Admitting: Physician Assistant

## 2018-09-18 ENCOUNTER — Other Ambulatory Visit: Payer: Self-pay

## 2018-09-18 ENCOUNTER — Ambulatory Visit (HOSPITAL_COMMUNITY)
Admission: RE | Admit: 2018-09-18 | Discharge: 2018-09-18 | Disposition: A | Discharge status: Home / Self Care | Payer: Self-pay | Source: Ambulatory Visit | Attending: Physician Assistant | Admitting: Physician Assistant

## 2018-09-18 VITALS — HR 76 | Ht 71.0 in | Wt >= 6400 oz

## 2018-09-18 DIAGNOSIS — I358 Other nonrheumatic aortic valve disorders: Secondary | ICD-10-CM

## 2018-09-18 DIAGNOSIS — Z950 Presence of cardiac pacemaker: Secondary | ICD-10-CM

## 2018-09-18 DIAGNOSIS — I5032 Chronic diastolic (congestive) heart failure: Secondary | ICD-10-CM

## 2018-09-18 DIAGNOSIS — I442 Atrioventricular block, complete: Secondary | ICD-10-CM

## 2018-09-18 DIAGNOSIS — Z6841 Body Mass Index (BMI) 40.0 and over, adult: Secondary | ICD-10-CM

## 2018-09-18 DIAGNOSIS — I480 Paroxysmal atrial fibrillation: Secondary | ICD-10-CM

## 2018-09-18 NOTE — Progress Notes (Signed)
Electrophysiology TeleHealth Note   Due to national recommendations of social distancing due to [place] 19, Audio/video telehealth visit is felt to be most appropriate for this patient at this time.  See MyChart message from today for patient consent regarding telehealth for the Atrial Fibrillation Clinic.    Date:  09/18/2018   ID:  Reginald [name], DOB [birth year], MRN [id number]  Location: home  Provider location: [address] [place], [place] [address] Evaluation Performed: Follow up  PCP:  [name], PA-C  Primary Cardiologist:  Dr Bronson Ing Primary Electrophysiologist: Dr Rayann Heman   CC: Follow up for atrial fibrillation   History of Present Illness: Reginald [name] is a 48 y.o. male who presents via audio/video conferencing for a telehealth visit today.  Patient reports that over the last month he has had weight gain (about 15 lbs), abdominal swelling, and more SOB and fatigue. On 4/15-4/16 he felt his heart racing intermittently associated with chest pain and fatigue. He sent in a manual transmission to the device clinic and he was noted to have episodes of afib with RVR.   DCCV was arranged for patient but he spontaneously converted and was in SR when he presented to endoscopy. He reports that he has felt better lately with still some residual fatigue. His weight continues to fluctuate but stays around 480-485 lbs.  Today, he denies symptoms of orthopnea, PND, claudication, dizziness, presyncope, syncope, bleeding, or neurologic sequela. The patient is tolerating medications without difficulties and is otherwise without complaint today.   he denies symptoms of cough, fevers, chills, or new SOB worrisome for COVID 19.    Atrial Fibrillation Risk Factors:  he does have symptoms or diagnosis of sleep apnea. he is compliant with CPAP therapy. he does not have a history of rheumatic fever.   he has a BMI of Body mass index is 67.64 kg/m.Marland Kitchen Filed Weights   09/18/18 0949   Weight: (!) 220 kg    Past Medical History:  Diagnosis Date  . Anxiety   . Aortic stenosis   . Arthritis   . Asthma   . Back pain   . Cellulitis of skin with lymphangitis   . CHF (congestive heart failure) ([place])   . Chronic diastolic congestive heart failure ([place])   . Chronic venous insufficiency   . Constipation   . COPD (chronic obstructive pulmonary disease) ([place])   . Depression   . Dyspnea   . Essential hypertension   . Gout   . Heart murmur   . Hypertension   . Morbid obesity ([place])   . Obesity   . Pneumonia    walking pneumonia  . Pre-diabetes   . Prostatitis   . Pulmonary embolism ([place])   . Pulmonary hypertension ([place])   . S/P aortic valve replacement with bioprosthetic valve 08/23/2017   25 mm Edwards Inspiris Resilia stented bovine pericardial tissue valve  . 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 Sleep apnea    cpap   . Thoracic ascending aortic aneurysm ([place])   . Thoracic ascending aortic aneurysm ([place])   . Tobacco abuse    Past Surgical History:  Procedure Laterality Date  . AORTIC VALVE REPLACEMENT N/A 08/23/2017   Procedure: AORTIC VALVE REPLACEMENT (AVR) USING INSPIRIS RESILIA AORTIC VALVE SIZE 25 MM;  Surgeon: Rexene Alberts, MD;  Location: [place];  Service: Open Heart Surgery;  Laterality: N/A;  . MULTIPLE EXTRACTIONS WITH ALVEOLOPLASTY N/A 07/23/2017  Procedure: Extraction of tooth #10 with alveoloplasty and gross debridement of remaining teeth;  Surgeon: Lenn Cal, DDS;  Location: [place];  Service: Oral Surgery;  Laterality: N/A;  . NO PAST SURGERIES    . PACEMAKER IMPLANT N/A 08/28/2017    St Jude Medical Assurity MRI conditional  dual-chamber pacemaker for symptomatic complete heart blockby Dr Rayann Heman  . TEE WITHOUT CARDIOVERSION N/A 07/16/2017   Procedure: TRANSESOPHAGEAL ECHOCARDIOGRAM (TEE);  Surgeon: Lelon Perla, MD;  Location: [facility] ENDOSCOPY;  Service: Cardiovascular;  Laterality:  N/A;  . TEE WITHOUT CARDIOVERSION N/A 08/23/2017   Procedure: TRANSESOPHAGEAL ECHOCARDIOGRAM (TEE);  Surgeon: Rexene Alberts, MD;  Location: [place];  Service: Open Heart Surgery;  Laterality: N/A;  . THORACIC AORTIC ANEURYSM REPAIR N/A 08/23/2017   Procedure: THORACIC ASCENDING ANEURYSM REPAIR (AAA) USING HEMASHIELD GOLD KNITTED MICROVEL DOUBLE VELOUR VASCULAR GRAFT D: 24 MM  L: 30 CM;  Surgeon: Rexene Alberts, MD;  Location: [place];  Service: Open Heart Surgery;  Laterality: N/A;     Current Outpatient Medications  Medication Sig Dispense Refill  . acetaminophen (TYLENOL) 325 MG tablet Take 650 mg by mouth every 6 (six) hours as needed for mild pain.    Marland Kitchen albuterol (PROVENTIL HFA;VENTOLIN HFA) 108 (90 Base) MCG/ACT inhaler Inhale 2 puffs into the lungs every 6 (six) hours as needed for wheezing or shortness of breath.     Marland Kitchen albuterol (PROVENTIL) (2.5 MG/3ML) 0.083% nebulizer solution INHALE 1 VIAL VIA NEBULIZER EVERY 6 HOURS AS NEEDED FOR FOR WHEEZING OR SHORTNESS OF BREATH (Patient taking differently: Take 2.5 mg by nebulization every 6 (six) hours as needed for wheezing or shortness of breath. ) 270 mL 1  . allopurinol (ZYLOPRIM) 300 MG tablet Take 1 tablet (300 mg total) by mouth daily. 90 tablet 1  . citalopram (CELEXA) 20 MG tablet Take 1 tablet (20 mg total) by mouth daily. 30 tablet 0  . docusate sodium (COLACE) 100 MG capsule Take 200 mg by mouth daily as needed for mild constipation.     . DULERA 200-5 MCG/ACT AERO INHALE 2 PUFFS BY MOUTH TWICE DAILY. RINSE MOUTH AFTER EACH USE (Patient taking differently: Inhale 2 puffs into the lungs 2 (two) times a day. ) 3 Inhaler 1  . lisinopril (PRINIVIL,ZESTRIL) 20 MG tablet Take 1 tablet (20 mg total) by mouth daily. 90 tablet 1  . loratadine (CLARITIN) 10 MG tablet Take 10 mg by mouth daily as needed for allergies.    . metoprolol tartrate (LOPRESSOR) 100 MG tablet TAKE 1 Tablet  BY MOUTH TWICE DAILY 180 tablet 0  . potassium chloride (MICRO-K)  10 MEQ CR capsule TAKE 1 Capsule BY MOUTH ONCE DAILY 90 capsule 0  . torsemide (DEMADEX) 20 MG tablet Take 20 mg by mouth 2 (two) times a day.    Alveda Reasons 20 MG TABS tablet TAKE 1 Tablet BY MOUTH ONCE DAILY WITH SUPPER 90 tablet 0   No current facility-administered medications for this encounter.     Allergies:   Patient has no known allergies.   Social History:  The patient  reports that he quit smoking about 12 months ago. His smoking use included cigarettes. He smoked 0.50 packs per day. He has never used smokeless tobacco. He reports that he does not drink alcohol or use drugs.   Family History:  [name]  family history includes Alzheimer's disease in his mother; Diabetes in his brother; Heart attack in his brother; Hypertension in his mother.    ROS:  Please see the history of present illness.   All other systems are personally reviewed and negative.   Exam: Well appearing obese male, alert and conversant, normal work of breathing, good skin color.  Recent Labs: 04/22/2018: TSH 4.717 05/06/2018: B Natriuretic Peptide 113.0; Hemoglobin 13.3; Platelets 266 07/05/2018: ALT 41; BUN 16; Creatinine, Ser 0.87; Potassium 3.7; Sodium 139  personally reviewed    Other studies personally reviewed: Additional studies/ records that were reviewed today include: Epic notes  Echo 03/22/18 - Left ventricle: Extremely limited images despite use of Definity   contrast. Based on very limited views of the left ventricle, LVEF   looks to be grossly normal range. The cavity size was normal. The   estimated ejection fraction was 55%. Left ventricular diastolic   function parameters were normal. - Aortic valve: Reported to be status post placement of 25 mm   Edwards Inspiris Resilia stented bovine pericardial tissue valve.   Structure is poorly visualized. Mildly to moderately calcified   annulus. There was no regurgitation. Mean gradient (S): 23 mm Hg. - Mitral valve: Mildly calcified  annulus. There was trivial   regurgitation. - Right ventricle: Poorly visualized. The cavity size was mildly   dilated. Reported to have pacemaker in place - cannot visualize   wire except to limited degree in the right atrium.. - Tricuspid valve: Poorly visualized. There was trivial   regurgitation. - Pulmonary arteries: Systolic pressure could not be accurately   estimated. - Inferior vena cava: Not visualized. - Pericardium, extracardiac: There was no pericardial effusion.    ASSESSMENT AND PLAN:  1. Paroxysmal atrial fibrillation Patient spontaneously converted to SR. DCCV cancelled. Symptomatically improved. Encouraged lifestyle modifications as below. Continue metoprolol 100 mg BID Continue Xarelto 20 mg daily  This patients CHA2DS2-VASc Score and unadjusted Ischemic Stroke Rate (% per year) is equal to 2.2 % stroke rate/year from a score of 2  Above score calculated as 1 point each if present [CHF, HTN, DM, Vascular=MI/PAD/Aortic Plaque, Age if 65-74, or Male] Above score calculated as 2 points each if present [Age > 75, or Stroke/TIA/TE]  2. Chronic diastolic CHF Patient reports that his SOB is improved.  Continue torsemide and 2g sodium diet.  3. Complete heart block S/p PPM, followed by Dr Rayann Heman and device clinic.  4. Aortic valve disease S/p AV surgery with bioprosthetic valve.  5. Morbid obesity Body mass index is 67.64 kg/m.  Certainly contributing to his overall medical issues and SOB/fatigue. Lifestyle modification was discussed and encouraged including regular physical activity and weight reduction. He follows with nutrition. Would recommend evaluation for bariatric surgery once COVID-19 precautions end.   COVID screen The patient does not have any symptoms that suggest any further testing/ screening at this time.  Social distancing reinforced today.    Follow-up with Dr Rayann Heman as scheduled. AF clinic in 6 months.   Current medicines are reviewed  at length with the patient today.   The patient does not have concerns regarding his medicines.  The following changes were made today: none  Labs/ tests ordered today include:  No orders of the defined types were placed in this encounter.   Patient Risk:  after full review of this patients clinical status, I feel that they are at moderate risk at this time.  Today, I have spent 10 minutes with the patient with telehealth technology discussing atrial fibrillation, lifestyle modifications, and COVID-19 precautions.    Gwenlyn Perking PA-C  09/18/2018 10:49 AM  [facility]  [facility] [address] [place], [place] [address] [phone]

## 2018-10-28 ENCOUNTER — Ambulatory Visit: Payer: Self-pay | Admitting: Nutrition

## 2018-10-28 ENCOUNTER — Encounter: Payer: Self-pay | Admitting: Physician Assistant

## 2018-10-28 ENCOUNTER — Ambulatory Visit: Payer: Self-pay | Admitting: Physician Assistant

## 2018-10-28 VITALS — HR 69 | Resp 24 | Wt >= 6400 oz

## 2018-10-28 DIAGNOSIS — M545 Low back pain, unspecified: Secondary | ICD-10-CM

## 2018-10-28 DIAGNOSIS — J449 Chronic obstructive pulmonary disease, unspecified: Secondary | ICD-10-CM

## 2018-10-28 MED ORDER — PREDNISONE 20 MG PO TABS: 20.0000 mg | ORAL_TABLET | Freq: Two times a day (BID) | ORAL | 0 refills | Status: DC

## 2018-10-28 NOTE — Progress Notes (Signed)
Subjective:    Patient ID: Reginald [name], male    DOB: 14-Aug-1970, 48 y.o.   MRN: [id number]  HPI: Reginald [name] is a 48 y.o. male presenting on 10/28/2018 for No chief complaint on file.   HPI   This is a telemedicine visit through [employer] due to coronavirus pandemic  I connected with  Reginald [name] on 10/28/2018 by a video enabled telemedicine application and verified that I am speaking with the correct person using two identifiers.   I discussed the limitations of evaluation and management by telemedicine. The patient expressed understanding and agreed to proceed.   Pt is at home.  Provider is at office/clinic   Pt awakened Saturday with LBP.  He says it doesn't hurt constantly.  He says pain worsens with moving and he sometimes gets a sharp pain.    Pt took an 800mg  IBU last night and it helped.   Pt denies injury.     Pt is SOB- says he needs to use his nebulizer more often- he hasn't used it lately.  He sometimes forgets to wear a mask when he goes to the store.    He started with increased dyspnea for the past week.   He has been feeling a little bad lately.  He has stayed in bed all day sometime over the past week due to feeling bad.     Pt quit smoking last year.  He has occassional bouts of copd exacerbation.  He doesn't think he has gained any weight lately.  He weighed himself now and is -480.2  Pt has pulse oximeter which is reading 100%.  Relevant past medical, surgical, family and social history reviewed and updated as indicated. Interim medical history since our last visit reviewed. Allergies and medications reviewed and updated.   Current Outpatient Medications:  .  acetaminophen (TYLENOL) 325 MG tablet, Take 650 mg by mouth every 6 (six) hours as needed for mild pain., Disp: , Rfl:  .  albuterol (PROVENTIL HFA;VENTOLIN HFA) 108 (90 Base) MCG/ACT inhaler, Inhale 2 puffs into the lungs every 6 (six) hours as needed for wheezing or shortness of breath. , Disp: , Rfl:   .  albuterol (PROVENTIL) (2.5 MG/3ML) 0.083% nebulizer solution, INHALE 1 VIAL VIA NEBULIZER EVERY 6 HOURS AS NEEDED FOR FOR WHEEZING OR SHORTNESS OF BREATH (Patient taking differently: Take 2.5 mg by nebulization every 6 (six) hours as needed for wheezing or shortness of breath. ), Disp: 270 mL, Rfl: 1 .  allopurinol (ZYLOPRIM) 300 MG tablet, Take 1 tablet (300 mg total) by mouth daily., Disp: 90 tablet, Rfl: 1 .  citalopram (CELEXA) 20 MG tablet, Take 1 tablet (20 mg total) by mouth daily., Disp: 30 tablet, Rfl: 0 .  docusate sodium (COLACE) 100 MG capsule, Take 200 mg by mouth daily as needed for mild constipation. , Disp: , Rfl:  .  DULERA 200-5 MCG/ACT AERO, INHALE 2 PUFFS BY MOUTH TWICE DAILY. RINSE MOUTH AFTER EACH USE (Patient taking differently: Inhale 2 puffs into the lungs 2 (two) times a day. ), Disp: 3 Inhaler, Rfl: 1 .  lisinopril (PRINIVIL,ZESTRIL) 20 MG tablet, Take 1 tablet (20 mg total) by mouth daily., Disp: 90 tablet, Rfl: 1 .  loratadine (CLARITIN) 10 MG tablet, Take 10 mg by mouth daily as needed for allergies., Disp: , Rfl:  .  metoprolol tartrate (LOPRESSOR) 100 MG tablet, TAKE 1 Tablet  BY MOUTH TWICE DAILY, Disp: 180 tablet, Rfl: 0 .  potassium chloride (  MICRO-K) 10 MEQ CR capsule, TAKE 1 Capsule BY MOUTH ONCE DAILY, Disp: 90 capsule, Rfl: 0 .  torsemide (DEMADEX) 20 MG tablet, Take 20 mg by mouth 2 (two) times a day., Disp: , Rfl:  .  XARELTO 20 MG TABS tablet, TAKE 1 Tablet BY MOUTH ONCE DAILY WITH SUPPER, Disp: 90 tablet, Rfl: 0    Review of Systems  Per HPI unless specifically indicated above     Objective:        Today's Vitals   10/28/18 1405  Pulse: 69  Resp: (!) 24  SpO2: 100%  Weight: (!) 480 lb 3.2 oz (217.8 kg)   Body mass index is 66.97 kg/m.   Physical Exam Vitals signs reviewed.  Constitutional:      General: He is not in acute distress.    Appearance: He is obese. He is not toxic-appearing.  HENT:     Head: Normocephalic and  atraumatic.  Pulmonary:     Effort: Tachypnea present. No respiratory distress.  Neurological:     Mental Status: He is alert and oriented to person, place, and time.  Psychiatric:        Attention and Perception: Attention normal.        Speech: Speech normal.        Behavior: Behavior is cooperative.        Cognition and Memory: Cognition normal.               Assessment & Plan:    Encounter Diagnoses  Name Primary?  . Low back pain, unspecified back pain laterality, unspecified chronicity, unspecified whether sciatica present Yes  . Chronic obstructive pulmonary disease, unspecified COPD type ([employer])   . Super-super obese ([place])     -Discussed getting test for CV19 with pt but he doesn't want to  -will rx prednisone which should help with both the back pain and copd exacerbation -discussed with pt using ice/heat on the back -discussed that his weight is contributing to his back pain as well as his difficulties breathing and encouraged him to work on weight loss with healthy diet and regular exercise -urged pt to go to ER if his breathing worsens.  Pt has appointment for routine follow up later this month.  It will be a telemedicine visit as pt is potential CV19.  Pt to contact office sooner for any worsening or new symptoms.  Pt states understanding and is in agreement with plan

## 2018-10-31 ENCOUNTER — Other Ambulatory Visit: Payer: Self-pay | Admitting: Physician Assistant

## 2018-11-05 ENCOUNTER — Telehealth: Payer: Self-pay | Admitting: Student

## 2018-11-05 NOTE — Telephone Encounter (Signed)
Pt can take OTC tylenol or aleve for his back pain. Pt can also apply heat or ice to his back for relief. Pt verbalized understanding.  Pt states he is now interested in getting tested now that he knows that his friend's brother tested positive. LPN explained to patient that [facility] will arrange for him to get tested and someone will call him to schedule. Pt verbalized understanding.

## 2018-11-06 ENCOUNTER — Telehealth: Payer: Self-pay

## 2018-11-06 DIAGNOSIS — Z20822 Contact with and (suspected) exposure to covid-19: Secondary | ICD-10-CM

## 2018-11-06 NOTE — Telephone Encounter (Signed)
Incoming call form Reginald [name] requesting that Patient be tested for Covid-19.  Telephone call to patient.  Patient Schedule for Thursday 11/07/18 at 0800 am Patient voices understanding.

## 2018-11-07 ENCOUNTER — Other Ambulatory Visit: Payer: Self-pay

## 2018-11-07 DIAGNOSIS — Z20822 Contact with and (suspected) exposure to covid-19: Secondary | ICD-10-CM

## 2018-11-11 LAB — NOVEL CORONAVIRUS, NAA: SARS-CoV-2, NAA: NOT DETECTED

## 2018-11-14 ENCOUNTER — Other Ambulatory Visit: Payer: Self-pay

## 2018-11-14 ENCOUNTER — Encounter: Payer: Self-pay | Admitting: Physician Assistant

## 2018-11-14 ENCOUNTER — Ambulatory Visit: Payer: Self-pay | Admitting: Physician Assistant

## 2018-11-14 ENCOUNTER — Other Ambulatory Visit (HOSPITAL_COMMUNITY)
Admission: RE | Admit: 2018-11-14 | Discharge: 2018-11-14 | Disposition: A | Discharge status: Home / Self Care | Payer: Self-pay | Source: Ambulatory Visit | Attending: Physician Assistant | Admitting: Physician Assistant

## 2018-11-14 VITALS — HR 70 | Temp 97.0°F | Wt >= 6400 oz

## 2018-11-14 DIAGNOSIS — Z7901 Long term (current) use of anticoagulants: Secondary | ICD-10-CM

## 2018-11-14 DIAGNOSIS — F39 Unspecified mood [affective] disorder: Secondary | ICD-10-CM

## 2018-11-14 DIAGNOSIS — I10 Essential (primary) hypertension: Secondary | ICD-10-CM | POA: Insufficient documentation

## 2018-11-14 DIAGNOSIS — Z953 Presence of xenogenic heart valve: Secondary | ICD-10-CM

## 2018-11-14 DIAGNOSIS — I5032 Chronic diastolic (congestive) heart failure: Secondary | ICD-10-CM

## 2018-11-14 DIAGNOSIS — E781 Pure hyperglyceridemia: Secondary | ICD-10-CM

## 2018-11-14 DIAGNOSIS — J449 Chronic obstructive pulmonary disease, unspecified: Secondary | ICD-10-CM

## 2018-11-14 LAB — TSH: TSH: 4.871 u[IU]/mL — ABNORMAL HIGH (ref 0.350–4.500)

## 2018-11-14 LAB — COMPREHENSIVE METABOLIC PANEL
ALT: 38 U/L (ref 0–44)
AST: 42 U/L — ABNORMAL HIGH (ref 15–41)
Albumin: 3.7 g/dL (ref 3.5–5.0)
Alkaline Phosphatase: 84 U/L (ref 38–126)
Anion gap: 13 (ref 5–15)
BUN: 20 mg/dL (ref 6–20)
CO2: 28 mmol/L (ref 22–32)
Calcium: 9.6 mg/dL (ref 8.9–10.3)
Chloride: 98 mmol/L (ref 98–111)
Creatinine, Ser: 0.79 mg/dL (ref 0.61–1.24)
GFR calc Af Amer: >60 mL/min (ref >60)
GFR calc non Af Amer: >60 mL/min (ref >60)
Glucose, Bld: 124 mg/dL — ABNORMAL HIGH (ref 70–99)
Potassium: 4.5 mmol/L (ref 3.5–5.1)
Sodium: 139 mmol/L (ref 135–145)
Total Bilirubin: 0.7 mg/dL (ref 0.3–1.2)
Total Protein: 7.9 g/dL (ref 6.5–8.1)

## 2018-11-14 LAB — LIPID PANEL
Cholesterol: 159 mg/dL (ref 0–200)
HDL: 28 mg/dL — ABNORMAL LOW (ref >40)
LDL Cholesterol: 60 mg/dL (ref 0–99)
Total CHOL/HDL Ratio: 5.7 RATIO
Triglycerides: 356 mg/dL — ABNORMAL HIGH (ref <150)
VLDL: 71 mg/dL — ABNORMAL HIGH (ref 0–40)

## 2018-11-14 NOTE — Progress Notes (Signed)
Pulse 70   Temp (!) 97 F (36.1 C)   Wt (!) 481 lb (218.2 kg)   SpO2 95%   BMI 67.09 kg/m    Subjective:    Patient ID: Reginald [name], male    DOB: [birth year], 48 y.o.   MRN: [id number]  HPI: Reginald [name] is a 48 y.o. male presenting on 11/14/2018 for No chief complaint on file.   HPI  This is a telemedicine appointment through [employer] due to coronavirus pandemic  I connected with  Reginald [name] on 11/14/2018  by a video enabled telemedicine application and verified that I am speaking with the correct person using two identifiers.   I discussed the limitations of evaluation and management by telemedicine. The patient expressed understanding and agreed to proceed.  Pt is at home.  Provider is at office  Pt had appointment with cardiology in April.    He has not been working on trying to get his weight down.  Pt says he feels weak.  He says he can't walk very far because his legs feel weak.    Pt is still having some anxiety. He is not having any changes in his breathing.    Relevant past medical, surgical, family and social history reviewed and updated as indicated. Interim medical history since our last visit reviewed. Allergies and medications reviewed and updated.   Current Outpatient Medications:  .  acetaminophen (TYLENOL) 325 MG tablet, Take 650 mg by mouth every 6 (six) hours as needed for mild pain., Disp: , Rfl:  .  albuterol (PROVENTIL HFA;VENTOLIN HFA) 108 (90 Base) MCG/ACT inhaler, Inhale 2 puffs into the lungs every 6 (six) hours as needed for wheezing or shortness of breath. , Disp: , Rfl:  .  albuterol (PROVENTIL) (2.5 MG/3ML) 0.083% nebulizer solution, INHALE 1 VIAL VIA NEBULIZER EVERY 6 HOURS AS NEEDED FOR FOR WHEEZING OR SHORTNESS OF BREATH (Patient taking differently: Take 2.5 mg by nebulization every 6 (six) hours as needed for wheezing or shortness of breath. ), Disp: 270 mL, Rfl: 1 .  allopurinol (ZYLOPRIM) 300 MG tablet, TAKE 1 Tablet BY MOUTH ONCE DAILY,  Disp: 90 tablet, Rfl: 1 .  citalopram (CELEXA) 20 MG tablet, Take 1 tablet (20 mg total) by mouth daily., Disp: 30 tablet, Rfl: 0 .  docusate sodium (COLACE) 100 MG capsule, Take 200 mg by mouth daily as needed for mild constipation. , Disp: , Rfl:  .  DULERA 200-5 MCG/ACT AERO, INHALE 2 PUFFS BY MOUTH TWICE DAILY. RINSE MOUTH AFTER EACH USE (Patient taking differently: Inhale 2 puffs into the lungs 2 (two) times a day. ), Disp: 3 Inhaler, Rfl: 1 .  lisinopril (ZESTRIL) 20 MG tablet, TAKE 1 Tablet BY MOUTH ONCE DAILY, Disp: 90 tablet, Rfl: 1 .  loratadine (CLARITIN) 10 MG tablet, Take 10 mg by mouth daily as needed for allergies., Disp: , Rfl:  .  metoprolol tartrate (LOPRESSOR) 100 MG tablet, TAKE 1 Tablet  BY MOUTH TWICE DAILY, Disp: 180 tablet, Rfl: 0 .  potassium chloride (MICRO-K) 10 MEQ CR capsule, TAKE 1 Capsule BY MOUTH ONCE DAILY, Disp: 90 capsule, Rfl: 0 .  torsemide (DEMADEX) 20 MG tablet, Take 20 mg by mouth 2 (two) times a day., Disp: , Rfl:  .  XARELTO 20 MG TABS tablet, TAKE 1 Tablet BY MOUTH ONCE DAILY WITH SUPPER, Disp: 90 tablet, Rfl: 0    Review of Systems  Per HPI unless specifically indicated above     Objective:  Pulse 70   Temp (!) 97 F (36.1 C)   Wt (!) 481 lb (218.2 kg)   SpO2 95%   BMI 67.09 kg/m   Wt Readings from Last 3 Encounters:  11/14/18 (!) 481 lb (218.2 kg)  10/28/18 (!) 480 lb 3.2 oz (217.8 kg)  09/18/18 (!) 485 lb (220 kg)    Physical Exam Constitutional:      General: He is not in acute distress.    Appearance: He is obese. He is not ill-appearing.  HENT:     Head: Normocephalic and atraumatic.  Pulmonary:     Effort: No respiratory distress.  Neurological:     Mental Status: He is alert and oriented to person, place, and time.  Psychiatric:        Mood and Affect: Mood is anxious.        Speech: Speech normal.        Behavior: Behavior is cooperative.        Cognition and Memory: Cognition normal.         Assessment & Plan:    Encounter Diagnoses  Name Primary?  . Essential hypertension Yes  . Chronic obstructive pulmonary disease, unspecified COPD type ([place])   . Super-super obese ([place])   . Hypertriglyceridemia   . Anticoagulant long-term use   . 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   . Chronic diastolic congestive heart failure ([place])   . Mood disorder ([place])      -will Update labs and call pt with results -long conversation with pt about gradually increasing his exercise tolerance.  Discussed that he hasn't been active in a very long time and with his BMI >60, it will be a slow process to get his muscles to tolerate new activity.  Discussed that he must get his weight down.  Discussed healthy diet to also help with this -encouraged pt to continue with [facility] for anxiety and depression -pt to continue with cardiology per their recommendations -no changes to medication today -pt to follow up in 3 months.  He is to contact office sooner prn

## 2018-11-19 ENCOUNTER — Telehealth: Payer: Self-pay | Admitting: Physician Assistant

## 2018-11-21 NOTE — Congregational Nurse Program (Signed)
  Dept: [phone]   Congregational Nurse Program Note  Date of Encounter: 11/19/2018  Past Medical History: Past Medical History:  Diagnosis Date  . Anxiety   . Aortic stenosis   . Arthritis   . Asthma   . Back pain   . Cellulitis of skin with lymphangitis   . CHF (congestive heart failure) ([place])   . Chronic diastolic congestive heart failure ([place])   . Chronic venous insufficiency   . Constipation   . COPD (chronic obstructive pulmonary disease) ([place])   . Depression   . Dyspnea   . Essential hypertension   . Gout   . Heart murmur   . Hypertension   . Morbid obesity ([place])   . Obesity   . Pneumonia    walking pneumonia  . Pre-diabetes   . Prostatitis   . Pulmonary embolism ([place])   . Pulmonary hypertension ([place])   . S/P aortic valve replacement with bioprosthetic valve 08/23/2017   25 mm Edwards Inspiris Resilia stented bovine pericardial tissue valve  . 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 Sleep apnea    cpap   . Thoracic ascending aortic aneurysm ([place])   . Thoracic ascending aortic aneurysm ([place])   . Tobacco abuse     Encounter Details: CNP Questionnaire - 11/19/18 1330      Questionnaire   Patient Status  Not Applicable      Client into [name] today as a dual client of [name] and [employer] program. Client is here to complete [facility] charity application with D. Barbette Or of [employer] and also from a referral from [place] to [employer].  Dione [place] BSW completed a needs and risk assessment and will assist client in any needed resources. Client is current with [facility] for [place] and is also working with [employer], [facility] program through [employer].    Will plan to follow client for continued Case Management with [facility] and also BSW with any supportive counseling needed.  [place] ([place]) RN Case Manager [facility] program

## 2018-11-27 ENCOUNTER — Ambulatory Visit (INDEPENDENT_AMBULATORY_CARE_PROVIDER_SITE_OTHER): Payer: Self-pay | Admitting: *Deleted

## 2018-11-27 DIAGNOSIS — I442 Atrioventricular block, complete: Secondary | ICD-10-CM

## 2018-11-27 LAB — CUP PACEART REMOTE DEVICE CHECK
Battery Remaining Longevity: 118 mo
Battery Remaining Percentage: 95 %
Battery Voltage: 3.02 V
Brady Statistic AP VP Percent: 1 %
Brady Statistic AP VS Percent: 4.4 %
Brady Statistic AS VP Percent: 1.3 %
Brady Statistic AS VS Percent: 94 %
Brady Statistic RA Percent Paced: 4.3 %
Brady Statistic RV Percent Paced: 1.5 %
Date Time Interrogation Session: 20200708060013
Implantable Lead Implant Date: 20190409
Implantable Lead Implant Date: 20190409
Implantable Lead Location: 753859
Implantable Lead Location: 753860
Implantable Pulse Generator Implant Date: 20190409
Lead Channel Impedance Value: 490 Ohm
Lead Channel Impedance Value: 550 Ohm
Lead Channel Pacing Threshold Amplitude: 0.5 V
Lead Channel Pacing Threshold Amplitude: 0.5 V
Lead Channel Pacing Threshold Pulse Width: 0.5 ms
Lead Channel Pacing Threshold Pulse Width: 0.5 ms
Lead Channel Sensing Intrinsic Amplitude: 5 mV
Lead Channel Sensing Intrinsic Amplitude: 5.2 mV
Lead Channel Setting Pacing Amplitude: 2 V
Lead Channel Setting Pacing Amplitude: 2.5 V
Lead Channel Setting Pacing Pulse Width: 0.5 ms
Lead Channel Setting Sensing Sensitivity: 2 mV
Pulse Gen Model: 2272
Pulse Gen Serial Number: 9010865

## 2018-12-02 ENCOUNTER — Encounter: Payer: Self-pay | Admitting: Thoracic Surgery (Cardiothoracic Vascular Surgery)

## 2018-12-02 ENCOUNTER — Ambulatory Visit (INDEPENDENT_AMBULATORY_CARE_PROVIDER_SITE_OTHER): Payer: Self-pay | Admitting: Thoracic Surgery (Cardiothoracic Vascular Surgery)

## 2018-12-02 ENCOUNTER — Other Ambulatory Visit: Payer: Self-pay

## 2018-12-02 VITALS — BP 129/76 | HR 64 | Temp 95.1°F | Resp 20 | Ht 71.0 in | Wt >= 6400 oz

## 2018-12-02 DIAGNOSIS — I712 Thoracic aortic aneurysm, without rupture: Secondary | ICD-10-CM

## 2018-12-02 DIAGNOSIS — Z95828 Presence of other vascular implants and grafts: Secondary | ICD-10-CM

## 2018-12-02 DIAGNOSIS — I350 Nonrheumatic aortic (valve) stenosis: Secondary | ICD-10-CM

## 2018-12-02 DIAGNOSIS — I7121 Aneurysm of the ascending aorta, without rupture: Secondary | ICD-10-CM

## 2018-12-02 DIAGNOSIS — Z953 Presence of xenogenic heart valve: Secondary | ICD-10-CM

## 2018-12-02 NOTE — Progress Notes (Signed)
[address]       [place] [address]             [phone]     CARDIOTHORACIC SURGERY OFFICE NOTE  Primary Cardiologist is Kate Sable, MD PCP is [name], PA-C   HPI:  Patient is a 48 year old morbidly obese male with long-standing history of chronic diastolic congestive heart failure, hypertension, chronic venous insufficiency, obstructive sleep apnea on CPAP, and long-standing tobacco abuse with COPD who returns to the office today for follow-up 1 year status post aortic valve replacement using a bioprosthetic tissue valve with supracoronary straight graft repair of ascending thoracic aortic aneurysm on August 23, 2017 for bicuspid aortic valve with severe symptomatic aortic stenosis and fusiform aneurysmal enlargement of the ascending thoracic aorta.  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  Several weeks later he was hospitalized with DVT and pulmonary embolus despite the fact he was anticoagulated using warfarin.  Warfarin was stopped and he was switched to Xarelto at that time.  He was last seen here in our office on December 10, 2017.  He has been followed regularly ever since by Dr. Bronson Ing by Dr. Rayann Heman.  Interrogation of his pacemaker has revealed primarily sinus rhythm with right bundle branch block but occasional episodes of atrial fibrillation.  Follow-up echocardiogram performed April 01, 2018 revealed preserved left ventricular systolic function with ejection fraction estimated 55%.  The bioprosthetic tissue valve in the aortic position was functioning normally with no aortic insufficiency and mean transvalvular gradient estimated 23 mmHg.  Patient returns her office today and reports that he remains severely limited by chronic fatigue and shortness of breath.  He states that he is basically unable to do much of anything and he is occasionally short  of breath at rest.  He cannot appreciate any improvement in his breathing capacity or exercise tolerance since his surgery.  He is not working and has applied for disability.  He has not gone back to smoking cigarettes.   Current Outpatient Medications  Medication Sig Dispense Refill   acetaminophen (TYLENOL) 325 MG tablet Take 650 mg by mouth every 6 (six) hours as needed for mild pain.     albuterol (PROVENTIL HFA;VENTOLIN HFA) 108 (90 Base) MCG/ACT inhaler Inhale 2 puffs into the lungs every 6 (six) hours as needed for wheezing or shortness of breath.      albuterol (PROVENTIL) (2.5 MG/3ML) 0.083% nebulizer solution INHALE 1 VIAL VIA NEBULIZER EVERY 6 HOURS AS NEEDED FOR FOR WHEEZING OR SHORTNESS OF BREATH (Patient taking differently: Take 2.5 mg by nebulization every 6 (six) hours as needed for wheezing or shortness of breath. ) 270 mL 1   allopurinol (ZYLOPRIM) 300 MG tablet TAKE 1 Tablet BY MOUTH ONCE DAILY 90 tablet 1   citalopram (CELEXA) 20 MG tablet Take 1 tablet (20 mg total) by mouth daily. (Patient taking differently: Take 40 mg by mouth daily. ) 30 tablet 0   docusate sodium (COLACE) 100 MG capsule Take 200 mg by mouth daily as needed for mild constipation.      DULERA 200-5 MCG/ACT AERO INHALE 2 PUFFS BY MOUTH TWICE DAILY. RINSE MOUTH AFTER EACH USE (Patient taking differently: Inhale 2 puffs into the lungs 2 (two) times a day. ) 3 Inhaler 1   lisinopril (ZESTRIL) 20 MG tablet TAKE 1 Tablet BY MOUTH ONCE DAILY 90 tablet 1   loratadine (CLARITIN) 10 MG tablet Take 10 mg by  mouth daily as needed for allergies.     metoprolol tartrate (LOPRESSOR) 100 MG tablet TAKE 1 Tablet  BY MOUTH TWICE DAILY 180 tablet 0   potassium chloride (MICRO-K) 10 MEQ CR capsule TAKE 1 Capsule BY MOUTH ONCE DAILY 90 capsule 0   torsemide (DEMADEX) 20 MG tablet Take 20 mg by mouth 2 (two) times a day.     XARELTO 20 MG TABS tablet TAKE 1 Tablet BY MOUTH ONCE DAILY WITH SUPPER 90 tablet 0   No  current facility-administered medications for this visit.       Physical Exam:   BP 129/76 (BP Location: Left Arm, Patient Position: Sitting, Cuff Size: Large) Comment (BP Location): lower arm   Pulse 64    Temp (!) 95.1 F (35.1 C) Comment: thermal   Resp 20    Ht 5\' 11"  (1.803 m)    Wt (!) 512 lb 3.2 oz (232.3 kg)    SpO2 96% Comment: RA   BMI 71.44 kg/m   General:  Extremely obese  Chest:   Clear to auscultation  CV:   Regular rate and rhythm with soft systolic murmur  Incisions:  Completely healed, sternum is stable  Abdomen:  Soft nontender  Extremities:  Warm and well-perfused with bilateral lower extremity edema  Diagnostic Tests:  Transthoracic Echocardiography  Patient:    Reginald [name], Reginald [name] MR #:       [id number] Study Date: 03/22/2018 Gender:     M Age:        5 Height:     180.3 cm Weight:     214.6 kg BSA:        3.41 m^2 Pt. Status: Room:   [place]ossa [id number]  SONOGRAPHER  Leavy Cella  ORDERING     Kate Sable, MD  [place], MD  PERFORMING   [facility], Forestine Na  ATTENDING    Derek Jack  cc:  ------------------------------------------------------------------- LV EF: 55%  ------------------------------------------------------------------- History:   PMH:  S/P aortic valve replacement with bioprosthetic valve 08/23/2017  25 mm Edwards Inspiris Resilia stented bovine pericardial tissue valve 20AC 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 24 mm Hemashield supracoronary straight graft  PHTN, Pulmonary Embolism, Former Smoker. Aortic Valve replacement. Congestive heart failure.  Congestive heart failure.  Risk factors:  Hypertension.  ------------------------------------------------------------------- Study Conclusions  - Left ventricle: Extremely limited images despite use of Definity   contrast. Based on very limited views of the left ventricle, LVEF   looks to be grossly normal range. The  cavity size was normal. The   estimated ejection fraction was 55%. Left ventricular diastolic   function parameters were normal. - Aortic valve: Reported to be status post placement of 25 mm   Edwards Inspiris Resilia stented bovine pericardial tissue valve.   Structure is poorly visualized. Mildly to moderately calcified   annulus. There was no regurgitation. Mean gradient (S): 23 mm Hg. - Mitral valve: Mildly calcified annulus. There was trivial   regurgitation. - Right ventricle: Poorly visualized. The cavity size was mildly   dilated. Reported to have pacemaker in place - cannot visualize   wire except to limited degree in the right atrium.. - Tricuspid valve: Poorly visualized. There was trivial   regurgitation. - Pulmonary arteries: Systolic pressure could not be accurately   estimated. - Inferior vena cava: Not visualized. - Pericardium, extracardiac: There was no pericardial effusion.  ------------------------------------------------------------------- Study data:  Comparison was made to the study of 05/25/2017.  Study  status:  Routine.  Procedure:  Transthoracic echocardiography. Image  quality was adequate. The study was technically difficult, as a res ult of poor acoustic windows and body habitus. Intravenous contrast (Definity) was administered.  Study completion:  There were no compl ications.          Transthoracic echocardiography.  M-mode, complete  2D, spectral Doppler, and color Doppler.  Birthdate:  Patient birth date: [birth year].  Age:  Patient is 48 yr old.  Sex:  Gender: male.    BMI: 66 kg/m^2.  Blood pressure:     150/82  Patient status:  Out patient.  Study date:  Study date: 03/22/2018. Study time: 02:03 PM.   Location:  Echo laboratory.  -------------------------------------------------------------------  ------------------------------------------------------------------- Left ventricle:  Extremely limited images despite use of Definity contrast.  Based on very limited views of the left ventricle, LVEF lo oks to be grossly normal range. The cavity size was normal. The esti mated ejection fraction was 55%. Images were inadequate for LV wall motion assessment. Left ventricular diastolic function parameters we re normal.  ------------------------------------------------------------------- Aortic valve:  Reported to be status post placement of 25 mm Edwards Inspiris Resilia stented bovine pericardial tissue valve. St ructure is poorly visualized. Poorly visualized.  Mildly to moderate ly calcified annulus.  Doppler:  There was no regurgitation.    VTI ratio of LVOT to aortic valve: 0.32. Peak velocity ratio of LVOT to aortic valve: 0.28. Mean velocity ratio of LVOT to aortic valve: 0.3 1.    Mean gradient (S): 23 mm Hg. Peak gradient (S): 47 mm Hg.  ------------------------------------------------------------------- Aorta:  Poorly visualized.  ------------------------------------------------------------------- Mitral valve:   Mildly calcified annulus.  Doppler:  There was trivial regurgitation.    Peak gradient (D): 5 mm Hg.  ------------------------------------------------------------------- Left atrium:  The atrium was normal in size.  ------------------------------------------------------------------- Right ventricle:  Poorly visualized. The cavity size was mildly dilated. Reported to have pacemaker in place - cannot visualize wire  except to limited degree in the right atrium.Marland Kitchen  ------------------------------------------------------------------- Pulmonic valve:   Not visualized.  ------------------------------------------------------------------- Tricuspid valve:  Poorly visualized.  Doppler:  There was trivial regurgitation.  ------------------------------------------------------------------- Pulmonary artery:    Systolic pressure could not be  accurately estimated.  ------------------------------------------------------------------- Right atrium:  The atrium was normal in size.  ------------------------------------------------------------------- Pericardium:  There was no pericardial effusion.  ------------------------------------------------------------------- Systemic veins: Inferior vena cava: Not visualized.  ------------------------------------------------------------------- Post procedure conclusions Ascending Aorta:  - Poorly visualized.  ------------------------------------------------------------------- Measurements   Left ventricle                     Value        Reference  LV e&', lateral                     9.68  cm/s   ----------  LV E/e&', lateral                   11.05        ----------  LV e&', medial                      8.16  cm/s   ----------  LV E/e&', medial                    13.11        ----------  LV e&', average  8.92  cm/s   ----------  LV E/e&', average                   12           ----------    LVOT                               Value        Reference  LVOT peak velocity, S              96.6  cm/s   ----------  LVOT mean velocity, S              69.7  cm/s   ----------  LVOT VTI, S                        20.2  cm     ----------    Aortic valve                       Value        Reference  Aortic valve peak velocity, S      342   cm/s   ----------  Aortic valve mean velocity, S      222   cm/s   ----------  Aortic valve VTI, S                63.4  cm     ----------  Aortic mean gradient, S            23    mm Hg  ----------  Aortic peak gradient, S            47    mm Hg  ----------  VTI ratio, LVOT/AV                 0.32         ----------  Velocity ratio, peak, LVOT/AV      0.28         ----------  Velocity ratio, mean, LVOT/AV      0.31         ----------    Left atrium                        Value        Reference  LA volume, S                        69.4  ml     ----------  LA volume/bsa, S                   20.3  ml/m^2 ----------  LA volume, ES, 1-p A4C             86.9  ml     ----------  LA volume/bsa, ES, 1-p A4C         25.5  ml/m^2 ----------  LA volume, ES, 1-p A2C             55.4  ml     ----------  LA volume/bsa, ES, 1-p A2C         16.2  ml/m^2 ----------    Mitral valve                       Value  Reference  Mitral E-wave peak velocity        107   cm/s   ----------  Mitral A-wave peak velocity        54.3  cm/s   ----------  Mitral deceleration time           197   ms     150 - 230  Mitral peak gradient, D            5     mm Hg  ----------  Mitral E/A ratio, peak             2            ----------    Right atrium                       Value        Reference  RA ID, S-I, ES, A4C           (H)  61.3  mm     34 - 49  RA area, ES, A4C              (H)  22.7  cm^2   8.3 - 19.5  RA volume, ES, A/L                 66.4  ml     ----------  RA volume/bsa, ES, A/L             19.5  ml/m^2 ----------    Right ventricle                    Value        Reference  TAPSE                              18.6  mm     ----------  RV s&', lateral, S                  10.4  cm/s   ----------  Legend: (L)  and  (H)  mark values outside specified reference range.  ------------------------------------------------------------------- Prepared and Electronically Authenticated by  Rozann Lesches, M.D. 2019-11-01T15:52:07   Impression:  Patient remains clinically stable from a cardiac standpoint more than 1 year status post aortic valve replacement using a bioprosthetic tissue valve and repair of a sending thoracic aorta.  The patient remains severely limited from a functional standpoint due to chronic respiratory insufficiency.  His physical mobility is quite limited.  He has not made any progress with attempts to lose weight.  Plan:  We have not recommended any change the patient's current medications.  I have strongly  encouraged the patient to seek professional help for finding a way to lose weight.  The patient has been reminded regarding the importance of dental hygiene and the lifelong need for antibiotic prophylaxis for all dental cleanings and other related invasive procedures.  In the future he will call and return to see [place] only should specific problems or questions arise.  I spent in excess of 15 minutes during the conduct of this office consultation and >50% of this time involved direct face-to-face encounter with the patient for counseling and/or coordination of their care.   Valentina Gu. Roxy Manns, MD 12/02/2018 1:17 PM

## 2018-12-02 NOTE — Progress Notes (Signed)
0

## 2018-12-02 NOTE — Patient Instructions (Signed)
Continue all previous medications without any changes at this time  You have been advised of the numerous benefits associated with regular exercise and weight loss.  The long term benefits for your overall health and wellbeing cannot be overestimated.  Endocarditis is a potentially serious infection of heart valves or inside lining of the heart.  It occurs more commonly in patients with diseased heart valves (such as patient's with aortic or mitral valve disease) and in patients who have undergone heart valve repair or replacement.  Certain surgical and dental procedures may put you at risk, such as dental cleaning, other dental procedures, or any surgery involving the respiratory, urinary, gastrointestinal tract, gallbladder or prostate gland.   To minimize your chances for develooping endocarditis, maintain good oral health and seek prompt medical attention for any infections involving the mouth, teeth, gums, skin or urinary tract.    Always notify your doctor or dentist about your underlying heart valve condition before having any invasive procedures. You will need to take antibiotics before certain procedures, including all routine dental cleanings or other dental procedures.  Your cardiologist or dentist should prescribe these antibiotics for you to be taken ahead of time.

## 2018-12-09 ENCOUNTER — Encounter: Payer: Self-pay | Admitting: Cardiology

## 2018-12-09 NOTE — Progress Notes (Signed)
Remote pacemaker transmission.   

## 2018-12-11 ENCOUNTER — Other Ambulatory Visit: Payer: Self-pay

## 2018-12-11 ENCOUNTER — Encounter: Payer: Self-pay | Admitting: Nutrition

## 2018-12-11 ENCOUNTER — Encounter: Payer: Self-pay | Attending: Physician Assistant | Admitting: Nutrition

## 2018-12-11 VITALS — Ht 71.0 in | Wt >= 6400 oz

## 2018-12-11 DIAGNOSIS — Z6841 Body Mass Index (BMI) 40.0 and over, adult: Secondary | ICD-10-CM | POA: Insufficient documentation

## 2018-12-11 DIAGNOSIS — R739 Hyperglycemia, unspecified: Secondary | ICD-10-CM | POA: Insufficient documentation

## 2018-12-11 NOTE — Patient Instructions (Addendum)
    Goals  Eat three balanced meals. Eat protein and vegetables Don't skip meals. Cut out koolaid and only drink water Walk as you can Keep appt with Cardiologist. Lose 2-3 lbs per week.

## 2018-12-11 NOTE — Progress Notes (Signed)
  Medical Nutrition Therapy:  Appt start time: 1030 end time:  1100  Assessment:  Primary concerns today:  Morbid Obesity. Seen at the  [facility]. Impaired fasting glucose, HTN. DId go to therapist at [facility]. Has increased medication and saw them recently.  Gets SOB easily.  Wt is up to 495 lbs. He thinks its related to his thyroid. Skips   CMP Latest Ref Rng & Units 11/14/2018 07/05/2018 05/06/2018  Glucose 70 - 99 mg/dL 124(H) 118(H) 123(H)  BUN 6 - 20 mg/dL 20 16 21(H)  Creatinine 0.61 - 1.24 mg/dL 0.79 0.87 0.93  Sodium 135 - 145 mmol/L 139 139 138  Potassium 3.5 - 5.1 mmol/L 4.5 3.7 4.1  Chloride 98 - 111 mmol/L 98 102 103  CO2 22 - 32 mmol/L 28 26 24   Calcium 8.9 - 10.3 mg/dL 9.6 9.0 8.7(L)  Total Protein 6.5 - 8.1 g/dL 7.9 8.0 7.9  Total Bilirubin 0.3 - 1.2 mg/dL 0.7 1.1 1.2  Alkaline Phos 38 - 126 U/L 84 73 77  AST 15 - 41 U/L 42(H) 49(H) 57(H)  ALT 0 - 44 U/L 38 41 42     Preferred Learning Style:   No preference indicated   Learning Readiness:   Ready  Change in progress   MEDICATIONS:    DIETARY INTAKE: 24-hr recall:  B ( AM): eggs and oatmeal, or skips Snk ( AM): L ( PM):  PB and jelly sandwich, water or kooilaid. Snk ( PM): D ( PM): skipped Snk ( PM):  Beverages: SF lemonade, water 2-3 bottles of water per day  Usual physical activity:ADL  Estimated energy needs: 1600  calories 180 g carbohydrates 120 g protein 44 g fat  Progress Towards Goal(s):  In progress.   Nutritional Diagnosis:  NI-1.5 Excessive energy intake As related to high calorie high fat high salt diet.  As evidenced by BMI 66 .    Intervention:  Nutrition and prediabetes education provided on My Plate, CHO counting, meal planning, portion sizes, timing of meals, avoiding snacks between meals  taking medications as prescribed, benefits of exercising 30 minutes per day and prevention of  DM. Healthy weight loss habits. Low sodium high fiber diet.     Goals  Eat three   Balance meals Eat protein and vegetables Don't skip meals. Cut out koolaid and only drink water Walk as you can Keep appt with Cardiologist. Lose 2-3 lbs per week.  Teaching Method Utilized:  Visual Auditory Hands on  Handouts given during visit include:  The Plate Method   Meal Plan Card   Barriers to learning/adherence to lifestyle change: Morbid obesity and no income or job  Demonstrated degree of understanding via:  Teach Back   Monitoring/Evaluation:  Dietary intake, exercise, meal plannign , and body weight in 3 month(s).

## 2018-12-11 NOTE — Progress Notes (Signed)
Medical Nutrition Therapy:  Appt start time: 1000 end time:  1030  Assessment:  Primary concerns today:  Morbid Obesity. Seen at the  [facility]. Impaired fasting glucose, HTN. DId go to therapist at [facility]. Saw Cardiologist in past. Scheduled to see him in a month or so. Gets SOB easily. Complains of it being really hot and not hungry to eat meals. . Diet is low in fresh fruits and vegetables and high in processed foods. Struggles with finances to afford healthier foods. Limited mobility due to his size. He feels his heart goes into AFIB and he sweats a lot doing nothing. Wants his thyroid checked out. Current diet and inconsistent meals and lack of activity are contributing to his weight status. He notes he will work on eating better and avoid processed foods.   Wt Readings from Last 3 Encounters:  12/11/18 (!) 495 lb (224.5 kg)  12/02/18 (!) 512 lb 3.2 oz (232.3 kg)  11/14/18 (!) 481 lb (218.2 kg)   Ht Readings from Last 3 Encounters:  12/11/18 5\' 11"  (1.803 m)  12/02/18 5\' 11"  (1.803 m)  09/18/18 5\' 11"  (1.803 m)   Body mass index is 69.04 kg/m. @BMIFA @ Facility age limit for growth percentiles is 20 years. Facility age limit for growth percentiles is 20 years.    CMP Latest Ref Rng & Units 11/14/2018 07/05/2018 05/06/2018  Glucose 70 - 99 mg/dL 124(H) 118(H) 123(H)  BUN 6 - 20 mg/dL 20 16 21(H)  Creatinine 0.61 - 1.24 mg/dL 0.79 0.87 0.93  Sodium 135 - 145 mmol/L 139 139 138  Potassium 3.5 - 5.1 mmol/L 4.5 3.7 4.1  Chloride 98 - 111 mmol/L 98 102 103  CO2 22 - 32 mmol/L 28 26 24   Calcium 8.9 - 10.3 mg/dL 9.6 9.0 8.7(L)  Total Protein 6.5 - 8.1 g/dL 7.9 8.0 7.9  Total Bilirubin 0.3 - 1.2 mg/dL 0.7 1.1 1.2  Alkaline Phos 38 - 126 U/L 84 73 77  AST 15 - 41 U/L 42(H) 49(H) 57(H)  ALT 0 - 44 U/L 38 41 42     Preferred Learning Style:   No preference indicated   Learning Readiness:   Ready  Change in progress   MEDICATIONS:    DIETARY INTAKE: 24-hr recall:   B ( AM): 1 packet oatmeal, egg and apple sauce, water Snk ( AM): L ( PM):  skipped Snk ( PM): D ( PM): Green beans.chicken, water Snk ( PM): .  Beverages: SF lemonade, water 2-3 bottles of water per day  Usual physical activity:ADL  Estimated energy needs: 1600  calories 180 g carbohydrates 120 g protein 44 g fat  Progress Towards Goal(s):  In progress.   Nutritional Diagnosis:  NI-1.5 Excessive energy intake As related to high calorie high fat high salt diet.  As evidenced by BMI 66 .    Intervention:  Nutrition and prediabetes education provided on My Plate, CHO counting, meal planning, portion sizes, timing of meals, avoiding snacks between meals  taking medications as prescribed, benefits of exercising 30 minutes per day and prevention of  DM. Healthy weight loss habits. Low sodium high fiber diet.   Goals  Eat three balanced meals as discussed. Eat protein and vegetables Don't skip meals. Cut out koolaid and only drink water Walk as you can Keep appt with Cardiologist. Lose 2-3 lbs per week.  Teaching Method Utilized:  Visual Auditory Hands on  Handouts given during visit include:  The Plate Method   Meal Plan Card  Barriers to learning/adherence to lifestyle change: Morbid obesity and no income or job  Demonstrated degree of understanding via:  Teach Back   Monitoring/Evaluation:  Dietary intake, exercise, meal plannign , and body weight in 3 month(s).

## 2019-01-02 ENCOUNTER — Other Ambulatory Visit: Payer: Self-pay

## 2019-01-02 ENCOUNTER — Ambulatory Visit (INDEPENDENT_AMBULATORY_CARE_PROVIDER_SITE_OTHER): Payer: Self-pay | Admitting: Student

## 2019-01-02 ENCOUNTER — Encounter: Payer: Self-pay | Admitting: Student

## 2019-01-02 VITALS — BP 152/87 | Temp 97.8°F | Ht 71.0 in | Wt >= 6400 oz

## 2019-01-02 DIAGNOSIS — I442 Atrioventricular block, complete: Secondary | ICD-10-CM

## 2019-01-02 DIAGNOSIS — I350 Nonrheumatic aortic (valve) stenosis: Secondary | ICD-10-CM

## 2019-01-02 DIAGNOSIS — G4733 Obstructive sleep apnea (adult) (pediatric): Secondary | ICD-10-CM

## 2019-01-02 DIAGNOSIS — Z79899 Other long term (current) drug therapy: Secondary | ICD-10-CM

## 2019-01-02 DIAGNOSIS — I480 Paroxysmal atrial fibrillation: Secondary | ICD-10-CM

## 2019-01-02 DIAGNOSIS — I10 Essential (primary) hypertension: Secondary | ICD-10-CM

## 2019-01-02 DIAGNOSIS — I5032 Chronic diastolic (congestive) heart failure: Secondary | ICD-10-CM

## 2019-01-02 MED ORDER — FUROSEMIDE 80 MG PO TABS: 80.0000 mg | ORAL_TABLET | Freq: Two times a day (BID) | ORAL | 3 refills | Status: DC

## 2019-01-02 NOTE — Progress Notes (Addendum)
Cardiology Office Note    Date:  01/02/2019   ID:  Reginald [name], DOB Jul 03, 1970, MRN [id number]  PCP:  [name], PA-C  Cardiologist: Kate Sable, MD   EP: Dr. Rayann Heman  Chief Complaint  Patient presents with   Follow-up    6 month visit    History of Present Illness:    CORDARRIUS COAD is a 48 y.o. male with past medical history of severe aortic stenosis (s/p bovine pericardial tissue AVR in 08/2017), PAF (diagnosed in 08/2017 but recurrence by transmission in 08/2018), CHB (s/p PPM placement in 08/2017), bilateral PE (diagnosed in 08/2017), no evidence of CAD by cath in 06/2017), HTN, HLD, and morbid obesity who presents to the office today for 42-month follow-up.  He was last examined by Dr. Bronson Ing in 06/2018 and reported that his weight fluctuated between 3 - 6 lbs on a daily basis. Was at 465 lbs during his office visit and he was continued on Torsemide 20mg  BID along with Lisinopril 20mg  daily, Lopressor 100mg  BID, and Xarelto 20mg  daily.   He called the office in 08/2018 reporting variable HR and a remote transmission showed episodes of atrial fibrillation with RVR. His episode was thought to be secondary to volume overload and options were reviewed in regards to TEE/DCCV as he had missed recent doses of Xarelto. He preferred to hold off on the procedure at that time. A follow-up visit was arranged and he was still having symptoms, therefore an outpatient DCCV was scheduled for 09/11/2018. He was found to be in normal sinus rhythm upon arrival and did not require cardioversion.  In talking with the patient today, he reports having worsening lower extremity edema and abdominal distention for the past 2 weeks. He typically stays at home a majority of the day and is able to elevate his lower extremities. This past weekend he was helping a friend at the flea market and was sitting in a chair for a majority of the day and unable to elevate his lower extremities. This caused  worsening swelling. He reports having baseline dyspnea on exertion. No recent change in his symptoms.  He utilizes his CPAP on a nightly basis.  Since this past weekend he has been taking his regularly scheduled Torsemide 20 mg twice daily but has also been utilizing an older prescription of Lasix 80 mg intermittently. Most of his medications are approved through [facility] and he is unable to afford medications out of pocket as he is without insurance company and is out of work and applying for disability. Torsemide is not covered by [employer] and costs him $ 30-40 a month which is unaffordable for him.   He is meeting with a Dietitian on a regular basis and is trying to make changes to his diet.   Past Medical History:  Diagnosis Date   Anxiety    Aortic stenosis    Arthritis    Asthma    Back pain    Cellulitis of skin with lymphangitis    CHF (congestive heart failure) (HCC)    Chronic diastolic congestive heart failure (HCC)    Chronic venous insufficiency    Constipation    COPD (chronic obstructive pulmonary disease) (HCC)    Depression    Dyspnea    Essential hypertension    Gout    Heart murmur    Hypertension    Morbid obesity ([place])    Obesity    Pneumonia    walking pneumonia   Pre-diabetes  Prostatitis    Pulmonary embolism (HCC)    Pulmonary hypertension (HCC)    S/P aortic valve replacement with bioprosthetic valve 08/23/2017   25 mm Edwards Inspiris Resilia stented bovine pericardial tissue valve   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   24 mm Hemashield supracoronary straight graft    Sleep apnea    cpap    Thoracic ascending aortic aneurysm [facility])    Thoracic ascending aortic aneurysm ([place])    Tobacco abuse     Past Surgical History:  Procedure Laterality Date   AORTIC VALVE REPLACEMENT N/A 08/23/2017   Procedure: AORTIC VALVE REPLACEMENT (AVR) USING INSPIRIS RESILIA AORTIC VALVE SIZE 25 MM;  Surgeon: Rexene Alberts, MD;   Location: [place];  Service: Open Heart Surgery;  Laterality: N/A;   MULTIPLE EXTRACTIONS WITH ALVEOLOPLASTY N/A 07/23/2017   Procedure: Extraction of tooth #10 with alveoloplasty and gross debridement of remaining teeth;  Surgeon: Lenn Cal, DDS;  Location: [place];  Service: Oral Surgery;  Laterality: N/A;   NO PAST SURGERIES     PACEMAKER IMPLANT N/A 08/28/2017    [employer] Assurity MRI conditional  dual-chamber pacemaker for symptomatic complete heart blockby Dr Rayann Heman   TEE WITHOUT CARDIOVERSION N/A 07/16/2017   Procedure: TRANSESOPHAGEAL ECHOCARDIOGRAM (TEE);  Surgeon: Lelon Perla, MD;  Location: [facility] ENDOSCOPY;  Service: Cardiovascular;  Laterality: N/A;   TEE WITHOUT CARDIOVERSION N/A 08/23/2017   Procedure: TRANSESOPHAGEAL ECHOCARDIOGRAM (TEE);  Surgeon: Rexene Alberts, MD;  Location: [place];  Service: Open Heart Surgery;  Laterality: N/A;   THORACIC AORTIC ANEURYSM REPAIR N/A 08/23/2017   Procedure: THORACIC ASCENDING ANEURYSM REPAIR (AAA) USING HEMASHIELD GOLD KNITTED MICROVEL DOUBLE VELOUR VASCULAR GRAFT D: 24 MM  L: 30 CM;  Surgeon: Rexene Alberts, MD;  Location: [place];  Service: Open Heart Surgery;  Laterality: N/A;    Current Medications: Outpatient Medications Prior to Visit  Medication Sig Dispense Refill   acetaminophen (TYLENOL) 325 MG tablet Take 650 mg by mouth every 6 (six) hours as needed for mild pain.     albuterol (PROVENTIL HFA;VENTOLIN HFA) 108 (90 Base) MCG/ACT inhaler Inhale 2 puffs into the lungs every 6 (six) hours as needed for wheezing or shortness of breath.      albuterol (PROVENTIL) (2.5 MG/3ML) 0.083% nebulizer solution INHALE 1 VIAL VIA NEBULIZER EVERY 6 HOURS AS NEEDED FOR FOR WHEEZING OR SHORTNESS OF BREATH (Patient taking differently: Take 2.5 mg by nebulization every 6 (six) hours as needed for wheezing or shortness of breath. ) 270 mL 1   allopurinol (ZYLOPRIM) 300 MG tablet TAKE 1 Tablet BY MOUTH ONCE DAILY 90 tablet 1   busPIRone  (BUSPAR) 10 MG tablet Take 5 mg by mouth 3 (three) times daily.      citalopram (CELEXA) 20 MG tablet Take 1 tablet (20 mg total) by mouth daily. (Patient taking differently: Take 40 mg by mouth daily. ) 30 tablet 0   docusate sodium (COLACE) 100 MG capsule Take 200 mg by mouth daily as needed for mild constipation.      DULERA 200-5 MCG/ACT AERO INHALE 2 PUFFS BY MOUTH TWICE DAILY. RINSE MOUTH AFTER EACH USE (Patient taking differently: Inhale 2 puffs into the lungs 2 (two) times a day. ) 3 Inhaler 1   lisinopril (ZESTRIL) 20 MG tablet TAKE 1 Tablet BY MOUTH ONCE DAILY 90 tablet 1   loratadine (CLARITIN) 10 MG tablet Take 10 mg by mouth daily as needed for allergies.     metoprolol tartrate (LOPRESSOR)  100 MG tablet TAKE 1 Tablet  BY MOUTH TWICE DAILY 180 tablet 0   potassium chloride (MICRO-K) 10 MEQ CR capsule TAKE 1 Capsule BY MOUTH ONCE DAILY 90 capsule 0   XARELTO 20 MG TABS tablet TAKE 1 Tablet BY MOUTH ONCE DAILY WITH SUPPER 90 tablet 0   torsemide (DEMADEX) 20 MG tablet Take 20 mg by mouth 2 (two) times a day.     No facility-administered medications prior to visit.      Allergies:   Patient has no known allergies.   Social History   Socioeconomic History   Marital status: Divorced    Spouse name: Not on file   Number of children: Not on file   Years of education: Not on file   Highest education level: Not on file  Occupational History   Not on file  Social Needs   Financial resource strain: Somewhat hard   Food insecurity    Worry: Never true    Inability: Never true   Transportation needs    Medical: No    Non-medical: No  Tobacco Use   Smoking status: Former Smoker    Packs/day: 0.50    Types: Cigarettes    Quit date: 08/23/2017    Years since quitting: 1.3   Smokeless tobacco: Never Used  Substance and Sexual Activity   Alcohol use: No    Frequency: Never    Comment: have had alcohol in the past, not heavy   Drug use: No   Sexual  activity: Not on file  Lifestyle   Physical activity    Days per week: Not on file    Minutes per session: Not on file   Stress: Not on file  Relationships   Social connections    Talks on phone: Not on file    Gets together: Not on file    Attends religious service: Not on file    Active member of club or organization: Not on file    Attends meetings of clubs or organizations: Not on file    Relationship status: Not on file  Other Topics Concern   Not on file  Social History Narrative   Not on file     Family History:  [name] family history includes Alzheimer's disease in his mother; Diabetes in his brother; Heart attack in his brother; Hypertension in his mother.   Review of Systems:   Please see the history of present illness.     General:  No chills, fever, night sweats or weight changes.  Cardiovascular:  No chest pain, orthopnea, palpitations, paroxysmal nocturnal dyspnea. Positive for dyspnea on exertion and edema.  Dermatological: No rash, lesions/masses Respiratory: No cough, dyspnea Urologic: No hematuria, dysuria Abdominal:   No nausea, vomiting, diarrhea, bright red blood per rectum, melena, or hematemesis Neurologic:  No visual changes, wkns, changes in mental status. All other systems reviewed and are otherwise negative except as noted above.   Physical Exam:    VS:  BP (!) 152/87 (BP Location: Right Wrist)    Temp 97.8 F (36.6 C)    Ht 5\' 11"  (1.803 m)    Wt (!) 512 lb (232.2 kg)    BMI 71.41 kg/m    General: Well developed, morbidly obese male appearing in no acute distress. Head: Normocephalic, atraumatic, sclera non-icteric, no xanthomas, nares are without discharge.  Neck: No carotid bruits. JVD difficult to assess secondary to body habitus.  Lungs: Respirations regular and unlabored, without wheezes or rales.  Heart: Regular rate  and rhythm. No S3 or S4.  No rubs or gallops appreciated. 2/6 SEM along RUSB.  Abdomen: Soft, non-tender,  non-distended with normoactive bowel sounds. No hepatomegaly. No rebound/guarding. No obvious abdominal masses. Msk:  Strength and tone appear normal for age. No joint deformities or effusions. Extremities: No clubbing or cyanosis. 2+ pitting edema bilaterally.  Distal pedal pulses are 2+ bilaterally. Neuro: Alert and oriented X 3. Moves all extremities spontaneously. No focal deficits noted. Psych:  Responds to questions appropriately with a normal affect. Skin: No rashes or lesions noted  Wt Readings from Last 3 Encounters:  01/02/19 (!) 512 lb (232.2 kg)  12/11/18 (!) 495 lb (224.5 kg)  12/02/18 (!) 512 lb 3.2 oz (232.3 kg)     Studies/Labs Reviewed:   EKG:  EKG is not ordered today.    Recent Labs: 05/06/2018: B Natriuretic Peptide 113.0; Hemoglobin 13.3; Platelets 266 11/14/2018: ALT 38; BUN 20; Creatinine, Ser 0.79; Potassium 4.5; Sodium 139; TSH 4.871   Lipid Panel    Component Value Date/Time   CHOL 159 11/14/2018 0949   TRIG 356 (H) 11/14/2018 0949   HDL 28 (L) 11/14/2018 0949   CHOLHDL 5.7 11/14/2018 0949   VLDL 71 (H) 11/14/2018 0949   LDLCALC 60 11/14/2018 0949    Additional studies/ records that were reviewed today include:   Echocardiogram: 03/2018 Study Conclusions  - Left ventricle: Extremely limited images despite use of Definity   contrast. Based on very limited views of the left ventricle, LVEF   looks to be grossly normal range. The cavity size was normal. The   estimated ejection fraction was 55%. Left ventricular diastolic   function parameters were normal. - Aortic valve: Reported to be status post placement of 25 mm   Edwards Inspiris Resilia stented bovine pericardial tissue valve.   Structure is poorly visualized. Mildly to moderately calcified   annulus. There was no regurgitation. Mean gradient (S): 23 mm Hg. - Mitral valve: Mildly calcified annulus. There was trivial   regurgitation. - Right ventricle: Poorly visualized. The cavity size was  mildly   dilated. Reported to have pacemaker in place - cannot visualize   wire except to limited degree in the right atrium.. - Tricuspid valve: Poorly visualized. There was trivial   regurgitation. - Pulmonary arteries: Systolic pressure could not be accurately   estimated. - Inferior vena cava: Not visualized. - Pericardium, extracardiac: There was no pericardial effusion.  Assessment:    1. Chronic diastolic congestive heart failure ([place])   2. Aortic valve stenosis, etiology of cardiac valve disease unspecified   3. PAF (paroxysmal atrial fibrillation) ([place])   4. CHB (complete heart block) (HCC)   5. Essential hypertension   6. OSA (obstructive sleep apnea)   7. Medication management      Plan:   In order of problems listed above:  1. Chronic Diastolic CHF - He reports worsening lower extremity edema over the past several weeks and weight has increased by over 20 pounds on his home scales. It is difficult to assess volume status by examination given his morbid obesity but he does have significant pitting edema. He is listed as taking Torsemide 20 mg twice daily but does not take this regularly as the medication is not covered by [employer] and he has to pay for this out of pocket. Has been taking this intermittently along with Lasix during the day. While I would prefer for him to remain on Torsemide due to increased bioavailability, we need to switch to something  that is covered by his current assistance plan. Will plan to restart Lasix at his prior dose of 80 mg twice daily. Recheck BMET in 1 week. Can hopefully reduce dosing once edema improves. His scales at home give variable readings so I have asked him to come to the office next week to have a repeat weight obtained. If no improvement, would consider the use of Metolazone.   2. Severe Aortic Stenosis - s/p bovine pericardial tissue AVR in 08/2017. Echocardiogram in 03/2018 showed no significant regurgitation.   3. Paroxysmal  Atrial Fibrillation - initially occurred in the post-operative setting in 08/2017 but he had recurrence in 08/2018. Reports being aware of his arrhythmia as he had significant palpitations at that time. He spontaneously converted back to normal sinus rhythm prior to scheduled DCCV in 08/2018 and denies any recurrent palpitations since. - Continue Lopressor 100 mg twice daily for rate control along with Xarelto for anticoagulation.  4. CHB - s/p PPM placement in 08/2017 which is followed by Dr. Rayann Heman. Interrogation last month showed normal device function with occasional episodes of atrial flutter with RVR.  5. HTN - BP is elevated at 152/87 during today's visit. He is currently on Lisinopril 20 mg daily and Lopressor 100 mg twice daily. I have encouraged him to follow BP with the dose adjustment of his diuretic therapy as outlined above.  6. OSA - continued compliance with CPAP encouraged.   Medication Adjustments/Labs and Tests Ordered: Current medicines are reviewed at length with the patient today.  Concerns regarding medicines are outlined above.  Medication changes, Labs and Tests ordered today are listed in the Patient Instructions below. Patient Instructions  Medication Instructions:  Your physician has recommended you make the following change in your medication:  Stop Taking Torsemide  Start Taking Lasix 80 mg Two Times Daily   If you need a refill on your cardiac medications before your next appointment, please call your pharmacy.   Lab work: Your physician recommends that you return for lab work in: 1 Week   If you have labs (blood work) drawn today and your tests are completely normal, you will receive your results only by:  [place] (if you have MyChart) OR  A paper copy in the mail If you have any lab test that is abnormal or we need to change your treatment, we will call you to review the results.  Testing/Procedures: NONE   Follow-Up: At [facility],  you and your health needs are our priority.  As part of our continuing mission to provide you with exceptional heart care, we have created designated Provider Care Teams.  These Care Teams include your primary Cardiologist (physician) and Advanced Practice Providers (APPs -  Physician Assistants and Nurse Practitioners) who all work together to provide you with the care you need, when you need it. You will need a follow up appointment in 6-8 weeks.  Please call our office 2 months in advance to schedule this appointment.  You may see Kate Sable, MD or one of the following Advanced Practice Providers on your designated Care Team:   Bernerd Pho, PA-C ([place])  Ermalinda Barrios, PA-C [facility])  Any Other Special Instructions Will Be Listed Below (If Applicable). Thank you for choosing [place]!       Signed, Erma Heritage, PA-C  01/02/2019 4:58 PM    [place] [address] [place], [place] [address] Phone: [phone] Fax: [phone]

## 2019-01-02 NOTE — Patient Instructions (Addendum)
Medication Instructions:  Your physician has recommended you make the following change in your medication:  Stop Taking Torsemide  Start Taking Lasix 80 mg Two Times Daily   If you need a refill on your cardiac medications before your next appointment, please call your pharmacy.   Lab work: Your physician recommends that you return for lab work in: 1 Week   If you have labs (blood work) drawn today and your tests are completely normal, you will receive your results only by: [place] [facility] MyChart Message (if you have MyChart) OR . A paper copy in the mail If you have any lab test that is abnormal or we need to change your treatment, we will call you to review the results.  Testing/Procedures: NONE   Follow-Up: At [facility], you and your health needs are our priority.  As part of our continuing mission to provide you with exceptional heart care, we have created designated Provider Care Teams.  These Care Teams include your primary Cardiologist (physician) and Advanced Practice Providers (APPs -  Physician Assistants and Nurse Practitioners) who all work together to provide you with the care you need, when you need it. You will need a follow up appointment in 6-8 weeks.  Please call our office 2 months in advance to schedule this appointment.  You may see Kate Sable, MD or one of the following Advanced Practice Providers on your designated Care Team:   Bernerd Pho, PA-C [facility]) . Ermalinda Barrios, PA-C ([place])  Any Other Special Instructions Will Be Listed Below (If Applicable). Thank you for choosing [place]!     Take Torsemide 40mg  twice daily until Lasix arrives. Once you have Lasix, take 80mg  twice daily.

## 2019-01-08 ENCOUNTER — Other Ambulatory Visit (HOSPITAL_COMMUNITY)
Admission: RE | Admit: 2019-01-08 | Discharge: 2019-01-08 | Disposition: A | Discharge status: Home / Self Care | Payer: Self-pay | Source: Ambulatory Visit | Attending: Student | Admitting: Student

## 2019-01-08 ENCOUNTER — Other Ambulatory Visit: Payer: Self-pay

## 2019-01-08 ENCOUNTER — Ambulatory Visit: Payer: Self-pay | Admitting: Student

## 2019-01-08 DIAGNOSIS — Z79899 Other long term (current) drug therapy: Secondary | ICD-10-CM | POA: Insufficient documentation

## 2019-01-08 LAB — BASIC METABOLIC PANEL
Anion gap: 10 (ref 5–15)
BUN: 16 mg/dL (ref 6–20)
CO2: 27 mmol/L (ref 22–32)
Calcium: 9.2 mg/dL (ref 8.9–10.3)
Chloride: 102 mmol/L (ref 98–111)
Creatinine, Ser: 0.79 mg/dL (ref 0.61–1.24)
GFR calc Af Amer: >60 mL/min (ref >60)
GFR calc non Af Amer: >60 mL/min (ref >60)
Glucose, Bld: 170 mg/dL — ABNORMAL HIGH (ref 70–99)
Potassium: 3.7 mmol/L (ref 3.5–5.1)
Sodium: 139 mmol/L (ref 135–145)

## 2019-01-08 NOTE — Telephone Encounter (Signed)
Provided lab results voiced understandings.

## 2019-01-09 ENCOUNTER — Telehealth: Payer: Self-pay | Admitting: *Deleted

## 2019-01-09 DIAGNOSIS — Z79899 Other long term (current) drug therapy: Secondary | ICD-10-CM

## 2019-01-09 MED ORDER — METOLAZONE 5 MG PO TABS: 5.0000 mg | ORAL_TABLET | ORAL | 11 refills | Status: DC

## 2019-01-09 MED ORDER — POTASSIUM CHLORIDE ER 10 MEQ PO CPCR: 10.0000 meq | ORAL_CAPSULE | Freq: Every day | ORAL | 3 refills | Status: DC

## 2019-01-09 NOTE — Telephone Encounter (Signed)
-----   Message from Erma Heritage, [place] sent at 01/08/2019  9:43 AM EDT ----- Please let the patient know his renal function and electrolytes remain stable. Weight was 512 lbs on the office scales today which is unchanged from his previous weight. Please have him start Metolazone 5mg  every other day. Should take 30 minutes before AM Lasix dose. Make sure he still has Good Rx card as Metolazone is not covered by [employer]. He needs to take two additional K+ pills on the days he takes Metolazone. Repeat BMET in 1-2 weeks with another weight in the office at that time. Please forward a copy of results to [name], PA-C.

## 2019-01-14 ENCOUNTER — Other Ambulatory Visit: Payer: Self-pay

## 2019-01-14 ENCOUNTER — Encounter: Payer: Self-pay | Admitting: Nutrition

## 2019-01-14 ENCOUNTER — Encounter: Payer: Self-pay | Attending: Physician Assistant | Admitting: Nutrition

## 2019-01-14 NOTE — Progress Notes (Signed)
Medical Nutrition Therapy:  Appt start time: 1000nd time:  1030  Assessment:  Primary concerns today:  Morbid Obesity. Seen at the  [facility]. Impaired fasting glucose, HTN. Went to see Melvyn Neth at heart care and has been working on getting fluid off. Now taking  80 mg of lasix BID, Metolazone EOD, Potassium 10 mg daily and additional 2 on the day of the extra fluid pill. He notes the fluid is coming off as he is urinating a lot now. He notes he no longer feels like he is choking from all the fluid.  Fluid has been coming off. Feels drained. He is watching his sodium intake. Rinsing his vegetables. Buying frozen instead of canned. New CPAP machine and mask. Feels better and sleeping better. DId go to therapist at [facility]. Has increased medication and saw them recently.  Gets SOB easily.  Working eating meals on time now. Trying to move more.  Goes to [employer] and [employer] for meal assistance. Not testing blood sugars. Last A1C was 5.9% . Motivated and trying to make changes with his diet.  CMP Latest Ref Rng & Units 01/08/2019 11/14/2018 07/05/2018  Glucose 70 - 99 mg/dL 170(H) 124(H) 118(H)  BUN 6 - 20 mg/dL 16 20 16   Creatinine 0.61 - 1.24 mg/dL 0.79 0.79 0.87  Sodium 135 - 145 mmol/L 139 139 139  Potassium 3.5 - 5.1 mmol/L 3.7 4.5 3.7  Chloride 98 - 111 mmol/L 102 98 102  CO2 22 - 32 mmol/L 27 28 26   Calcium 8.9 - 10.3 mg/dL 9.2 9.6 9.0  Total Protein 6.5 - 8.1 g/dL - 7.9 8.0  Total Bilirubin 0.3 - 1.2 mg/dL - 0.7 1.1  Alkaline Phos 38 - 126 U/L - 84 73  AST 15 - 41 U/L - 42(H) 49(H)  ALT 0 - 44 U/L - 38 41     Preferred Learning Style:   No preference indicated   Learning Readiness:   Ready  Change in progress   MEDICATIONS:    DIETARY INTAKE: 24-hr recall:  B ( AM): 2 eggs, 1 packet oatmeal and fruit or toast. k ( AM): L ( PM):  Spaghetti  2 cups., chicken patty,  Pears, water Snk ( PM): D ( PM): chicken breast in air fryer, green beans and pears,  water Snk ( PM):  Beverages:  Usual physical activity:ADL  Estimated energy needs: 1600  calories 180 g carbohydrates 120 g protein 44 g fat  Progress Towards Goal(s):  In progress.   Nutritional Diagnosis:  NI-1.5 Excessive energy intake As related to high calorie high fat high salt diet.  As evidenced by BMI 66 .    Intervention:  Nutrition and prediabetes education provided on My Plate, CHO counting, meal planning, portion sizes, timing of meals, avoiding snacks between meals  taking medications as prescribed, benefits of exercising 30 minutes per day and prevention of  DM. Healthy weight loss habits. Low sodium high fiber diet.     Goals  Eat three  Balance meals Eat prot ein and vegetables Keep reading food labels. Drink only water Keep sodium to less than 1500 mg a day. Walk as you can Lose 2-3 lbs per week.  Teaching Method Utilized:  Visual Auditory Hands on  Handouts given during visit include:  The Plate Method   Meal Plan Card   Barriers to learning/adherence to lifestyle change: Morbid obesity and no income or job  Demonstrated degree of understanding via:  Teach Back   Monitoring/Evaluation:  Dietary intake, exercise, meal plannign , and body weight in 3 month(s).

## 2019-01-14 NOTE — Patient Instructions (Addendum)
  Goals  Eat three  Balance meals Eat prot ein and vegetables Keep reading food labels. Drink only water Keep sodium to less than 1500 mg a day. Walk as you can Lose 2-3 lbs per week.

## 2019-01-16 ENCOUNTER — Telehealth: Payer: Self-pay

## 2019-01-17 NOTE — Telephone Encounter (Signed)
Spoke with pt regarding appt on 01/20/19. Pt stated he will check his vitals prior to appt and did not have any questions at this time.

## 2019-01-20 ENCOUNTER — Encounter: Payer: Self-pay | Admitting: Internal Medicine

## 2019-01-20 ENCOUNTER — Telehealth (INDEPENDENT_AMBULATORY_CARE_PROVIDER_SITE_OTHER): Payer: Self-pay | Admitting: Internal Medicine

## 2019-01-20 VITALS — Ht 71.0 in | Wt >= 6400 oz

## 2019-01-20 DIAGNOSIS — I480 Paroxysmal atrial fibrillation: Secondary | ICD-10-CM

## 2019-01-20 DIAGNOSIS — I442 Atrioventricular block, complete: Secondary | ICD-10-CM

## 2019-01-20 DIAGNOSIS — G4733 Obstructive sleep apnea (adult) (pediatric): Secondary | ICD-10-CM

## 2019-01-20 NOTE — Progress Notes (Signed)
Electrophysiology TeleHealth Note  Due to national recommendations of social distancing due to [place] 19, an audio telehealth visit is felt to be most appropriate for this patient at this time.  Verbal consent was obtained by me for the telehealth visit today.  The patient does not have capability for a virtual visit.  A phone visit is therefore required today.   Date:  01/20/2019   ID:  Reginald [name], DOB 23-Sep-1970, MRN [id number]  Location: patient's home  Provider location:  [place]  Evaluation Performed: Follow-up visit  PCP:  [name], PA-C   Electrophysiologist:  Dr Rayann Heman  Chief Complaint:  palpitations  History of Present Illness:    Reginald [name] is a 48 y.o. male who presents via telehealth conferencing today.  Since last being seen in our clinic, the patient reports doing reasonably well.  He is morbidly obese and not very active.  Still having issues with fluid retention.  He is following with Dr Bronson Ing for this.  He has SOB with minimal activity.  + edema.  Today, he denies symptoms of palpitations, chest pain,  dizziness, presyncope, or syncope.  The patient is otherwise without complaint today.  The patient denies symptoms of fevers, chills, cough, or new SOB worrisome for COVID 19.  Past Medical History:  Diagnosis Date  . Anxiety   . Aortic stenosis   . Arthritis   . Asthma   . Back pain   . Cellulitis of skin with lymphangitis   . CHF (congestive heart failure) ([place])   . Chronic diastolic congestive heart failure ([place])   . Chronic venous insufficiency   . Constipation   . COPD (chronic obstructive pulmonary disease) ([place])   . Depression   . Dyspnea   . Essential hypertension   . Gout   . Heart murmur   . Hypertension   . Morbid obesity ([place])   . Obesity   . Pneumonia    walking pneumonia  . Pre-diabetes   . Prostatitis   . Pulmonary embolism ([place])   . Pulmonary hypertension ([place])   . S/P aortic valve replacement with  bioprosthetic valve 08/23/2017   25 mm Edwards Inspiris Resilia stented bovine pericardial tissue valve  . 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 Sleep apnea    cpap   . Thoracic ascending aortic aneurysm ([place])   . Thoracic ascending aortic aneurysm ([place])   . Tobacco abuse     Past Surgical History:  Procedure Laterality Date  . AORTIC VALVE REPLACEMENT N/A 08/23/2017   Procedure: AORTIC VALVE REPLACEMENT (AVR) USING INSPIRIS RESILIA AORTIC VALVE SIZE 25 MM;  Surgeon: Rexene Alberts, MD;  Location: [facility];  Service: Open Heart Surgery;  Laterality: N/A;  . MULTIPLE EXTRACTIONS WITH ALVEOLOPLASTY N/A 07/23/2017   Procedure: Extraction of tooth #10 with alveoloplasty and gross debridement of remaining teeth;  Surgeon: Lenn Cal, DDS;  Location: [facility];  Service: Oral Surgery;  Laterality: N/A;  . NO PAST SURGERIES    . PACEMAKER IMPLANT N/A 08/28/2017    St Jude Medical Assurity MRI conditional  dual-chamber pacemaker for symptomatic complete heart blockby Dr Rayann Heman  . TEE WITHOUT CARDIOVERSION N/A 07/16/2017   Procedure: TRANSESOPHAGEAL ECHOCARDIOGRAM (TEE);  Surgeon: Lelon Perla, MD;  Location: [facility] ENDOSCOPY;  Service: Cardiovascular;  Laterality: N/A;  . TEE WITHOUT CARDIOVERSION N/A 08/23/2017   Procedure: TRANSESOPHAGEAL ECHOCARDIOGRAM (TEE);  Surgeon: Rexene Alberts, MD;  Location:  [facility] OR;  Service: Open Heart Surgery;  Laterality: N/A;  . THORACIC AORTIC ANEURYSM REPAIR N/A 08/23/2017   Procedure: THORACIC ASCENDING ANEURYSM REPAIR (AAA) USING HEMASHIELD GOLD KNITTED MICROVEL DOUBLE VELOUR VASCULAR GRAFT D: 24 MM  L: 30 CM;  Surgeon: Rexene Alberts, MD;  Location: [facility];  Service: Open Heart Surgery;  Laterality: N/A;    Current Outpatient Medications  Medication Sig Dispense Refill  . acetaminophen (TYLENOL) 325 MG tablet Take 650 mg by mouth every 6 (six) hours as needed for mild pain.    Marland Kitchen albuterol (PROVENTIL  HFA;VENTOLIN HFA) 108 (90 Base) MCG/ACT inhaler Inhale 2 puffs into the lungs every 6 (six) hours as needed for wheezing or shortness of breath.     Marland Kitchen albuterol (PROVENTIL) (2.5 MG/3ML) 0.083% nebulizer solution INHALE 1 VIAL VIA NEBULIZER EVERY 6 HOURS AS NEEDED FOR FOR WHEEZING OR SHORTNESS OF BREATH (Patient taking differently: Take 2.5 mg by nebulization every 6 (six) hours as needed for wheezing or shortness of breath. ) 270 mL 1  . allopurinol (ZYLOPRIM) 300 MG tablet TAKE 1 Tablet BY MOUTH ONCE DAILY 90 tablet 1  . busPIRone (BUSPAR) 10 MG tablet Take 5 mg by mouth 3 (three) times daily.     . citalopram (CELEXA) 20 MG tablet Take 40 mg by mouth daily.    Marland Kitchen docusate sodium (COLACE) 100 MG capsule Take 200 mg by mouth daily as needed for mild constipation.     . DULERA 200-5 MCG/ACT AERO INHALE 2 PUFFS BY MOUTH TWICE DAILY. RINSE MOUTH AFTER EACH USE (Patient taking differently: Inhale 2 puffs into the lungs 2 (two) times a day. ) 3 Inhaler 1  . furosemide (LASIX) 80 MG tablet Take 1 tablet (80 mg total) by mouth 2 (two) times daily. 180 tablet 3  . Krill Oil 350 MG CAPS Take 1 capsule by mouth daily.    Marland Kitchen lisinopril (ZESTRIL) 20 MG tablet TAKE 1 Tablet BY MOUTH ONCE DAILY 90 tablet 1  . loratadine (CLARITIN) 10 MG tablet Take 10 mg by mouth daily as needed for allergies.    . metolazone (ZAROXOLYN) 5 MG tablet Take 1 tablet (5 mg total) by mouth every other day. 15 tablet 11  . metoprolol tartrate (LOPRESSOR) 100 MG tablet TAKE 1 Tablet  BY MOUTH TWICE DAILY 180 tablet 0  . potassium chloride (MICRO-K) 10 MEQ CR capsule Take 1 capsule (10 mEq total) by mouth daily. Take additional 2 Tablets on the Days That you take Metolazone 180 capsule 3  . XARELTO 20 MG TABS tablet TAKE 1 Tablet BY MOUTH ONCE DAILY WITH SUPPER 90 tablet 0   No current facility-administered medications for this visit.     Allergies:   Patient has no known allergies.   Social History:  The patient  reports that he quit  smoking about 16 months ago. His smoking use included cigarettes. He smoked 0.50 packs per day. He has never used smokeless tobacco. He reports that he does not drink alcohol or use drugs.   Family History:  [name] family history includes Alzheimer's disease in his mother; Diabetes in his brother; Heart attack in his brother; Hypertension in his mother.   ROS:  Please see the history of present illness.   All other systems are personally reviewed and negative.    Exam:    Vital Signs:  Ht 5\' 11"  (1.803 m)   Wt (!) 496 lb (225 kg)   BMI 69.18 kg/m   Well sounding  Labs/Other  Tests and Data Reviewed:    Recent Labs: 05/06/2018: B Natriuretic Peptide 113.0; Hemoglobin 13.3; Platelets 266 11/14/2018: ALT 38; TSH 4.871 01/08/2019: BUN 16; Creatinine, Ser 0.79; Potassium 3.7; Sodium 139   Wt Readings from Last 3 Encounters:  01/20/19 (!) 496 lb (225 kg)  01/02/19 (!) 512 lb (232.2 kg)  12/11/18 (!) 495 lb (224.5 kg)     Last device remote is reviewed from [place] PDF which reveals normal device function, afib is controlled   ASSESSMENT & PLAN:    1.  Mobitz II second degree heart block Remotes are updodate Normal device function by remotes  2. afib afib burden is <1 % by PPM Continue on metoprolol land xarleto  3. HTN Stable No change required today  4. Morbid obesity Weight reduction is essential for his survival He does not have insurance and therefore cannot afford bariatric surgery as recommended  5. OSA Compliance with CPAP encouraged  6. S/p AVR  (bioprosthetic) with thoracic aortic aneurysm repair Followed by Dr Bronson Ing  7. Chronic diastolic dysfunction Followed by Dr Bronson Ing   Follow-up:  [facility] Return to see me in a year in [place].    Patient Risk:  after full review of this patients clinical status, I feel that they are at moderate risk at this time.  Today, I have spent 15 minutes with the patient with telehealth technology discussing  arrhythmia management .    Army Fossa, MD  01/20/2019 2:24 PM     [facility] [address] [place] [address] [phone] (office) [phone] (fax)

## 2019-01-23 ENCOUNTER — Telehealth: Payer: Self-pay | Admitting: *Deleted

## 2019-01-23 ENCOUNTER — Other Ambulatory Visit (HOSPITAL_COMMUNITY)
Admission: RE | Admit: 2019-01-23 | Discharge: 2019-01-23 | Disposition: A | Discharge status: Home / Self Care | Payer: Self-pay | Source: Ambulatory Visit | Attending: Student | Admitting: Student

## 2019-01-23 ENCOUNTER — Other Ambulatory Visit: Payer: Self-pay

## 2019-01-23 DIAGNOSIS — Z79899 Other long term (current) drug therapy: Secondary | ICD-10-CM | POA: Insufficient documentation

## 2019-01-23 LAB — BASIC METABOLIC PANEL
Anion gap: 10 (ref 5–15)
BUN: 31 mg/dL — ABNORMAL HIGH (ref 6–20)
CO2: 28 mmol/L (ref 22–32)
Calcium: 8.8 mg/dL — ABNORMAL LOW (ref 8.9–10.3)
Chloride: 98 mmol/L (ref 98–111)
Creatinine, Ser: 1.22 mg/dL (ref 0.61–1.24)
GFR calc Af Amer: >60 mL/min (ref >60)
GFR calc non Af Amer: >60 mL/min (ref >60)
Glucose, Bld: 147 mg/dL — ABNORMAL HIGH (ref 70–99)
Potassium: 3.6 mmol/L (ref 3.5–5.1)
Sodium: 136 mmol/L (ref 135–145)

## 2019-01-23 NOTE — Telephone Encounter (Signed)
Pt in office to weight. Current weight is 495.2 lbs.

## 2019-01-24 ENCOUNTER — Other Ambulatory Visit: Payer: Self-pay | Admitting: Physician Assistant

## 2019-01-24 ENCOUNTER — Telehealth: Payer: Self-pay | Admitting: *Deleted

## 2019-01-24 DIAGNOSIS — Z79899 Other long term (current) drug therapy: Secondary | ICD-10-CM

## 2019-01-24 MED ORDER — METOLAZONE 5 MG PO TABS: 5.0000 mg | ORAL_TABLET | ORAL | 11 refills | Status: DC

## 2019-01-24 NOTE — Telephone Encounter (Signed)
-----   Message from Erma Heritage, [place] sent at 01/23/2019  8:58 PM EDT ----- I am glad to hear his weight has declined by 16 lbs! Electrolytes remain within normal limits. Kidneys slightly stressed which is likely due to increased diuretic use. Would recommend continuing Lasix at current dosing but would reduce Metolazone to twice weekly - likely Monday and Friday. Please make sure he has a Good Rx card as this is not on [facility]. Repeat BMET in 1 month.

## 2019-01-24 NOTE — Telephone Encounter (Signed)
Pt notified, orders placed   

## 2019-02-03 ENCOUNTER — Encounter: Payer: Self-pay | Admitting: Physician Assistant

## 2019-02-03 ENCOUNTER — Ambulatory Visit: Payer: Self-pay | Admitting: Physician Assistant

## 2019-02-03 DIAGNOSIS — I10 Essential (primary) hypertension: Secondary | ICD-10-CM

## 2019-02-03 DIAGNOSIS — I5032 Chronic diastolic (congestive) heart failure: Secondary | ICD-10-CM

## 2019-02-03 DIAGNOSIS — F39 Unspecified mood [affective] disorder: Secondary | ICD-10-CM

## 2019-02-03 DIAGNOSIS — J449 Chronic obstructive pulmonary disease, unspecified: Secondary | ICD-10-CM

## 2019-02-03 DIAGNOSIS — R0602 Shortness of breath: Secondary | ICD-10-CM

## 2019-02-03 DIAGNOSIS — Z7901 Long term (current) use of anticoagulants: Secondary | ICD-10-CM

## 2019-02-03 MED ORDER — ALBUTEROL SULFATE HFA 108 (90 BASE) MCG/ACT IN AERS: 2.0000 | INHALATION_SPRAY | Freq: Four times a day (QID) | RESPIRATORY_TRACT | 1 refills | Status: DC | PRN

## 2019-02-03 NOTE — Progress Notes (Signed)
There were no vitals taken for this visit.   Subjective:    Patient ID: Reginald [name], male    DOB: 17-Sep-1970, 48 y.o.   MRN: [id number]  HPI: Reginald [name] is a 48 y.o. male presenting on 02/03/2019 for Follow-up   HPI   This is a telemedicine appointment through [employer] due to coronavirus pandemic.    I connected with  Reginald [name] on 02/03/19 by a video enabled telemedicine application and verified that I am speaking with the correct person using two identifiers.   I discussed the limitations of evaluation and management by telemedicine. The patient expressed understanding and agreed to proceed.  Pt is sitting in his car at his home.  He is in a hurry to get to [facility] and will be leaving as soon as he is finished with this appointment.  Provider is at office.  Offered to reschedule pt but he says he wants to continue now, he just needs to be finished in a timely manner.     Pt is seeing many specialists:  Dr Rayann Heman- EP- for his afib and complete heart block Dr Jacinta Shoe- cardiology-  for chf, edema  Dr Roxy Manns- CT surgery- s/p AVR with thoracic aortic aneurysm repair Dr Hoyle Barr- psychiatrist- at [facility] for [facility] issues [place] for obesity    Recent blood pressure readings:   bp 129/76 on 12/02/18 at dr Darylene Price office and 152/87 on 01/02/19 at cardiology office.    Pt's diuretics were changed at his most recent cardiology office.   His lasix was restarted due to he wasn't taking his torsemide regularly.    Pt says he has been Getting dizzy a lot and falls down- for a few weeks.  He fell yesterday.  He thinks it might possibly have started when his diuretics were adjusted.    He says he is having follow up appointment with cardiology next week and he is having his labs rechecked.   He says his weight is up and down.  He says based on his scales his weight is down.  He is not exercisiing.  He says he gets "wore out" and then "feels unstable".  Also he has  sob.    Pt smoked from age 43 up until 2019.   He says rescue inhalers help but he needs more- says his are out of date.    He says he applied for medicaid last year and was denied.  He has [facility] [employer] financial assistance.     Relevant past medical, surgical, family and social history reviewed and updated as indicated. Interim medical history since our last visit reviewed. Allergies and medications reviewed and updated.   Current Outpatient Medications:  .  acetaminophen (TYLENOL) 325 MG tablet, Take 650 mg by mouth every 6 (six) hours as needed for mild pain., Disp: , Rfl:  .  albuterol (PROVENTIL HFA;VENTOLIN HFA) 108 (90 Base) MCG/ACT inhaler, Inhale 2 puffs into the lungs every 6 (six) hours as needed for wheezing or shortness of breath. , Disp: , Rfl:  .  albuterol (PROVENTIL) (2.5 MG/3ML) 0.083% nebulizer solution, INHALE 1 VIAL VIA NEBULIZER EVERY 6 HOURS AS NEEDED FOR FOR WHEEZING OR SHORTNESS OF BREATH (Patient taking differently: Take 2.5 mg by nebulization every 6 (six) hours as needed for wheezing or shortness of breath. ), Disp: 270 mL, Rfl: 1 .  allopurinol (ZYLOPRIM) 300 MG tablet, TAKE 1 Tablet BY MOUTH ONCE DAILY, Disp: 90 tablet, Rfl: 1 .  busPIRone (BUSPAR) 10 MG tablet, Take 10 mg by mouth 3 (three) times daily. , Disp: , Rfl:  .  citalopram (CELEXA) 20 MG tablet, Take 40 mg by mouth daily., Disp: , Rfl:  .  docusate sodium (COLACE) 100 MG capsule, Take 200 mg by mouth daily as needed for mild constipation. , Disp: , Rfl:  .  DULERA 200-5 MCG/ACT AERO, INHALE 2 PUFFS BY MOUTH TWICE DAILY. RINSE MOUTH AFTER EACH USE, Disp: 39 g, Rfl: 1 .  furosemide (LASIX) 80 MG tablet, Take 1 tablet (80 mg total) by mouth 2 (two) times daily. (Patient taking differently: Take 40 mg by mouth 2 (two) times daily. 2 tabs (80mg ) bid), Disp: 180 tablet, Rfl: 3 .  ibuprofen (ADVIL) 800 MG tablet, Take 800 mg by mouth every 8 (eight) hours as needed (back pain)., Disp: , Rfl:  .  Krill  Oil 350 MG CAPS, Take 1 capsule by mouth daily., Disp: , Rfl:  .  lisinopril (ZESTRIL) 20 MG tablet, TAKE 1 Tablet BY MOUTH ONCE DAILY, Disp: 90 tablet, Rfl: 1 .  loratadine (CLARITIN) 10 MG tablet, Take 10 mg by mouth daily as needed for allergies., Disp: , Rfl:  .  metolazone (ZAROXOLYN) 5 MG tablet, Take 1 tablet (5 mg total) by mouth 2 (two) times a week. (Patient taking differently: Take 5 mg by mouth 2 (two) times a week. On Mondays and Friday), Disp: 10 tablet, Rfl: 11 .  metoprolol tartrate (LOPRESSOR) 100 MG tablet, TAKE 1 Tablet  BY MOUTH TWICE DAILY, Disp: 180 tablet, Rfl: 0 .  potassium chloride (MICRO-K) 10 MEQ CR capsule, Take 1 capsule (10 mEq total) by mouth daily. Take additional 2 Tablets on the Days That you take Metolazone, Disp: 180 capsule, Rfl: 3 .  XARELTO 20 MG TABS tablet, TAKE 1 Tablet BY MOUTH ONCE DAILY WITH SUPPER, Disp: 90 tablet, Rfl: 0 .  metolazone (ZAROXOLYN) 5 MG tablet, Take 1 tablet (5 mg total) by mouth every other day. (Patient not taking: Reported on 02/03/2019), Disp: 15 tablet, Rfl: 11    Review of Systems  Per HPI unless specifically indicated above     Objective:    There were no vitals taken for this visit.  Wt Readings from Last 3 Encounters:  01/20/19 (!) 496 lb (225 kg)  01/02/19 (!) 512 lb (232.2 kg)  12/11/18 (!) 495 lb (224.5 kg)    Physical Exam Constitutional:      General: He is not in acute distress.    Appearance: He is obese. He is not ill-appearing.  HENT:     Head: Normocephalic and atraumatic.  Pulmonary:     Effort: No respiratory distress.     Comments: Pt becomes SOB with talking.  As he sits quietly, his breathing returns to a more normal state Neurological:     Mental Status: He is alert and oriented to person, place, and time.  Psychiatric:        Attention and Perception: Attention normal.        Speech: Speech normal.        Behavior: Behavior is cooperative.     Results for orders placed or performed during  the hospital encounter of [id number]  Basic metabolic panel  Result Value Ref Range   Sodium 136 135 - 145 mmol/L   Potassium 3.6 3.5 - 5.1 mmol/L   Chloride 98 98 - 111 mmol/L   CO2 28 22 - 32 mmol/L   Glucose, Bld 147 (H) 70 - 99  mg/dL   BUN 31 (H) 6 - 20 mg/dL   Creatinine, Ser 1.22 0.61 - 1.24 mg/dL   Calcium 8.8 (L) 8.9 - 10.3 mg/dL   GFR calc non Af Amer >60 >60 mL/min   GFR calc Af Amer >60 >60 mL/min   Anion gap 10 5 - 15      Assessment & Plan:    Encounter Diagnoses  Name Primary?  . Essential hypertension Yes  . Chronic obstructive pulmonary disease, unspecified COPD type ([place])   . Super-super obese ([place])   . Chronic diastolic congestive heart failure ([place])   . Mood disorder ([place])   . Anticoagulant long-term use   . SOB (shortness of breath)        CHF-Defer to cardiology for ongoing management.  It was recommended to pt that he discuss the light-headedness he has been experiencing.  Discussed that he should call to notify office this week in case they want to see him sooner or recheck his labs sooner.   He is encouraged to use caution to prevent a fall or syncopal episode (get up slowly, etc)   After much upset by the pt that his weight showed more or less than the scales at the cardiology office and he was almost to tears, we discussed a change. We did discuss that he should focus on changes up and down on his own scales and stop comparing his weights at home with those at the cardiology office.  Discussed that it would be nice if they registered the same values but he needed to focus more on whether his weight was up or down and how many pounds.  He says that makes sense.     Pt to continue with [facility] for MH issues   Dyspnea- Discussed with pt that his weight plays a role in his breathing difficulties and that weight loss would go a long way to help with restrictive component.  For the obstructive component, pt is on dulera and uses albuterol inhalers as  needed.  Will Refer to pulmonologist to get input on any further recomendations to help his COPD.       No changes to medications today.   Pt will follow up in office in 6 weeks for recheck.  He is to contact office sooner if worsens or develops new syptoms

## 2019-02-11 ENCOUNTER — Encounter: Payer: Self-pay | Attending: Physician Assistant | Admitting: Nutrition

## 2019-02-11 ENCOUNTER — Other Ambulatory Visit: Payer: Self-pay

## 2019-02-11 ENCOUNTER — Encounter: Payer: Self-pay | Admitting: Nutrition

## 2019-02-11 DIAGNOSIS — R739 Hyperglycemia, unspecified: Secondary | ICD-10-CM | POA: Insufficient documentation

## 2019-02-11 DIAGNOSIS — Z6841 Body Mass Index (BMI) 40.0 and over, adult: Secondary | ICD-10-CM | POA: Insufficient documentation

## 2019-02-11 NOTE — Progress Notes (Signed)
  Medical Nutrition Therapy:  Appt start time: 1000nd time:  1030  Assessment:  Primary concerns today:  Morbid Obesity. Seen at the  [facility]. Impaired fasting glucose, HTN. Wt up 4 lbs. May be fluid related. He has been working on eating better. Lasix takiing 80 mg BID. Says he thinks it helped it a lot.  Taking Metalazone twice a week also. Creatine 1.22 mg/dl. Changes made: drinking only water now. Trying to follow the Plate Method and increasing fresh fruits and vegetables when he has access to them. Avoiding junk food as best he can.. Had fallen a few times and will talk to [facility] PCP. Fees frustrated he is not able to breathe better and lose more weight. He sees a therapist but feels hopeless at times and depressed. Denies any suicidal thoughts. Depressed over his chronic medical problems and his breathing and sleeping. Sees someone at [facility] for [facility].   CMP Latest Ref Rng & Units 01/23/2019 01/08/2019 11/14/2018  Glucose 70 - 99 mg/dL 147(H) 170(H) 124(H)  BUN 6 - 20 mg/dL 31(H) 16 20  Creatinine 0.61 - 1.24 mg/dL 1.22 0.79 0.79  Sodium 135 - 145 mmol/L 136 139 139  Potassium 3.5 - 5.1 mmol/L 3.6 3.7 4.5  Chloride 98 - 111 mmol/L 98 102 98  CO2 22 - 32 mmol/L 28 27 28   Calcium 8.9 - 10.3 mg/dL 8.8(L) 9.2 9.6  Total Protein 6.5 - 8.1 g/dL - - 7.9  Total Bilirubin 0.3 - 1.2 mg/dL - - 0.7  Alkaline Phos 38 - 126 U/L - - 84  AST 15 - 41 U/L - - 42(H)  ALT 0 - 44 U/L - - 38     Preferred Learning Style:   No preference indicated   Learning Readiness:   Ready  Change in progress   MEDICATIONS:    DIETARY INTAKE: 24-hr recall:  B ( AM): 2 eggs, 1 packet oatmeal and fruit or toast. k ( AM): L ( PM):Salad with chicken grilled using the air fryer, water,  Snk ( PM): D ( PM) chili beans and corn bread 4,  2-3 cups , water Snk ( PM):  Beverages:  Usual physical activity:ADL  Estimated energy needs: 1600  calories 180 g carbohydrates 120 g protein 44 g  fat  Progress Towards Goal(s):  In progress.   Nutritional Diagnosis:  NI-1.5 Excessive energy intake As related to high calorie high fat high salt diet.  As evidenced by BMI 66 .    Intervention:  Nutrition and prediabetes education provided on My Plate, CHO counting, meal planning, portion sizes, timing of meals, avoiding snacks between meals  taking medications as prescribed, benefits of exercising 30 minutes per day and prevention of  DM. Healthy weight loss habits. Low sodium high fiber diet.     Goals [place] to eat three meals per day Get outside and get fresh air Continue to eat fresh fruits and vegetables. Make a gratitude list. Continue to avoid salty and processed foods. Lose 2-5 lbs per month. Talk to MD about referral to Dr. Dorris Fetch for thyroid evaluation..   Teaching Method Utilized:  Visual Auditory Hands on  Handouts given during visit include:  The Plate Method   Meal Plan Card   Barriers to learning/adherence to lifestyle change: Morbid obesity and no income or job  Demonstrated degree of understanding via:  Teach Back   Monitoring/Evaluation:  Dietary intake, exercise, meal plannign , and body weight in 3 month(s).

## 2019-02-11 NOTE — Patient Instructions (Addendum)
  Goals [place] to eat three meals per day Get outside and get fresh air Continue to eat fresh fruits and vegetables. Make a gratitude list. Continue to avoid salty and processed foods. Lose 2-5 lbs per month. Talk to MD about referral to Dr. Dorris Fetch for thyroid evaluation.Marland Kitchen

## 2019-02-13 ENCOUNTER — Other Ambulatory Visit (HOSPITAL_COMMUNITY)
Admission: RE | Admit: 2019-02-13 | Discharge: 2019-02-13 | Disposition: A | Discharge status: Home / Self Care | Payer: Self-pay | Source: Ambulatory Visit | Attending: Student | Admitting: Student

## 2019-02-13 ENCOUNTER — Encounter: Payer: Self-pay | Admitting: Student

## 2019-02-13 ENCOUNTER — Ambulatory Visit (INDEPENDENT_AMBULATORY_CARE_PROVIDER_SITE_OTHER): Payer: Self-pay | Admitting: Student

## 2019-02-13 ENCOUNTER — Other Ambulatory Visit: Payer: Self-pay

## 2019-02-13 VITALS — BP 127/69 | HR 74 | Temp 97.6°F | Ht 71.0 in | Wt >= 6400 oz

## 2019-02-13 DIAGNOSIS — I442 Atrioventricular block, complete: Secondary | ICD-10-CM

## 2019-02-13 DIAGNOSIS — I480 Paroxysmal atrial fibrillation: Secondary | ICD-10-CM

## 2019-02-13 DIAGNOSIS — I5032 Chronic diastolic (congestive) heart failure: Secondary | ICD-10-CM

## 2019-02-13 DIAGNOSIS — G4733 Obstructive sleep apnea (adult) (pediatric): Secondary | ICD-10-CM

## 2019-02-13 DIAGNOSIS — I10 Essential (primary) hypertension: Secondary | ICD-10-CM

## 2019-02-13 DIAGNOSIS — Z79899 Other long term (current) drug therapy: Secondary | ICD-10-CM | POA: Insufficient documentation

## 2019-02-13 DIAGNOSIS — I350 Nonrheumatic aortic (valve) stenosis: Secondary | ICD-10-CM

## 2019-02-13 LAB — BASIC METABOLIC PANEL
Anion gap: 12 (ref 5–15)
BUN: 26 mg/dL — ABNORMAL HIGH (ref 6–20)
CO2: 31 mmol/L (ref 22–32)
Calcium: 9.2 mg/dL (ref 8.9–10.3)
Chloride: 93 mmol/L — ABNORMAL LOW (ref 98–111)
Creatinine, Ser: 1.09 mg/dL (ref 0.61–1.24)
GFR calc Af Amer: >60 mL/min (ref >60)
GFR calc non Af Amer: >60 mL/min (ref >60)
Glucose, Bld: 105 mg/dL — ABNORMAL HIGH (ref 70–99)
Potassium: 3.2 mmol/L — ABNORMAL LOW (ref 3.5–5.1)
Sodium: 136 mmol/L (ref 135–145)

## 2019-02-13 LAB — CBC
HCT: 43.7 % (ref 39.0–52.0)
Hemoglobin: 13.4 g/dL (ref 13.0–17.0)
MCH: 25.7 pg — ABNORMAL LOW (ref 26.0–34.0)
MCHC: 30.7 g/dL (ref 30.0–36.0)
MCV: 83.7 fL (ref 80.0–100.0)
Platelets: 309 10*3/uL (ref 150–400)
RBC: 5.22 MIL/uL (ref 4.22–5.81)
RDW: 18.3 % — ABNORMAL HIGH (ref 11.5–15.5)
WBC: 13.4 10*3/uL — ABNORMAL HIGH (ref 4.0–10.5)
nRBC: 0 % (ref 0.0–0.2)

## 2019-02-13 NOTE — Patient Instructions (Signed)
Medication Instructions: Your physician recommends that you continue on your current medications as directed. Please refer to the Current Medication list given to you today.   Labwork: Cbc,bmet today  Procedures/Testing: None today  Follow-Up: 2 months with [place], PA-C  Any Additional Special Instructions Will Be Listed Below (If Applicable).     If you need a refill on your cardiac medications before your next appointment, please call your pharmacy.     Thank you for choosing [facility] !

## 2019-02-13 NOTE — Progress Notes (Signed)
Cardiology Office Note    Date:  02/13/2019   ID:  Reginald [name], DOB 02-12-1971, MRN [id number]  PCP:  [name], PA-C  Cardiologist: Kate Sable, MD    Chief Complaint  Patient presents with   Follow-up    6 week visit    History of Present Illness:    Reginald [name] is a 48 y.o. male with past medical history of severe aortic stenosis (s/p bovine pericardial tissue AVR in 08/2017), paroxysmal atrial fibrillation, CHB (s/p PPM placement in 08/2017), bilateral PE, HTN, OSA and normal cors by cath in 2019 who presents to the office today for 6-week follow-up.  He was last examined by myself in 12/2018 and reported worsening edema and abdominal distention over the past 2 weeks. He had been taking Torsemide 20 mg twice daily but also utilizing an older prescription of Lasix 80 mg intermittently and Torsemide was not covered by [facility] [employer] and was costing $30-$40 a month which was unaffordable for him. I was hopeful he could remain on Torsemide for improved bioavailability but he was switched back to Lasix 80 mg twice daily with repeat BMET recommended in 1 week. Weight did not change significantly with this and he was started on Metolazone 5 mg every other day. Weight had declined by 16 lbs when checked on 9/4 (at 496 lbs) and follow-up labs showed K+ at 3.6 and creatinine 1.22. Metolazone was reduced to twice weekly with repeat BMET in 1 month.   In talking with the patient today, he reports that his breathing did improve with weight loss but he still feels short of breath with minimal activity. Also have a productive cough. He denies any associated chest pain or palpitations. Edema has also improved. He does utilize his CPAP on a nightly basis.  He reports intermittent episodes of dizziness and has fallen twice within the past 3 weeks. Reports he feels like his legs give out from underneath him. Denies any actual syncopal episodes. One episode occurred after having a bowel  movement. No associated chest pain or palpitations with the events. He reports one episode occurred on the day he took Metolazone while another episode occurred on a day he did not utilize the medication.   Past Medical History:  Diagnosis Date   Anxiety    Aortic stenosis    Arthritis    Asthma    Back pain    Cellulitis of skin with lymphangitis    CHF (congestive heart failure) (HCC)    Chronic diastolic congestive heart failure (HCC)    Chronic venous insufficiency    Constipation    COPD (chronic obstructive pulmonary disease) (HCC)    Depression    Dyspnea    Essential hypertension    Gout    Heart murmur    Hypertension    Morbid obesity ([employer])    Obesity    Pneumonia    walking pneumonia   Pre-diabetes    Prostatitis    Pulmonary embolism (HCC)    Pulmonary hypertension (HCC)    S/P aortic valve replacement with bioprosthetic valve 08/23/2017   25 mm Edwards Inspiris Resilia stented bovine pericardial tissue valve   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   24 mm Hemashield supracoronary straight graft    Sleep apnea    cpap    Thoracic ascending aortic aneurysm [facility])    Thoracic ascending aortic aneurysm (HCC)    Tobacco abuse     Past Surgical History:  Procedure Laterality Date  AORTIC VALVE REPLACEMENT N/A 08/23/2017   Procedure: AORTIC VALVE REPLACEMENT (AVR) USING INSPIRIS RESILIA AORTIC VALVE SIZE 25 MM;  Surgeon: Rexene Alberts, MD;  Location: [facility];  Service: Open Heart Surgery;  Laterality: N/A;   MULTIPLE EXTRACTIONS WITH ALVEOLOPLASTY N/A 07/23/2017   Procedure: Extraction of tooth #10 with alveoloplasty and gross debridement of remaining teeth;  Surgeon: Lenn Cal, DDS;  Location: [facility];  Service: Oral Surgery;  Laterality: N/A;   NO PAST SURGERIES     PACEMAKER IMPLANT N/A 08/28/2017    St Jude Medical Assurity MRI conditional  dual-chamber pacemaker for symptomatic complete heart blockby Dr Rayann Heman   TEE  WITHOUT CARDIOVERSION N/A 07/16/2017   Procedure: TRANSESOPHAGEAL ECHOCARDIOGRAM (TEE);  Surgeon: Lelon Perla, MD;  Location[facility] ENDOSCOPY;  Service: Cardiovascular;  Laterality: N/A;   TEE WITHOUT CARDIOVERSION N/A 08/23/2017   Procedure: TRANSESOPHAGEAL ECHOCARDIOGRAM (TEE);  Surgeon: Rexene Alberts, MD;  Location: [place];  Service: Open Heart Surgery;  Laterality: N/A;   THORACIC AORTIC ANEURYSM REPAIR N/A 08/23/2017   Procedure: THORACIC ASCENDING ANEURYSM REPAIR (AAA) USING HEMASHIELD GOLD KNITTED MICROVEL DOUBLE VELOUR VASCULAR GRAFT D: 24 MM  L: 30 CM;  Surgeon: Rexene Alberts, MD;  Location: [place];  Service: Open Heart Surgery;  Laterality: N/A;    Current Medications: Outpatient Medications Prior to Visit  Medication Sig Dispense Refill   acetaminophen (TYLENOL) 325 MG tablet Take 650 mg by mouth every 6 (six) hours as needed for mild pain.     albuterol (PROVENTIL) (2.5 MG/3ML) 0.083% nebulizer solution INHALE 1 VIAL VIA NEBULIZER EVERY 6 HOURS AS NEEDED FOR FOR WHEEZING OR SHORTNESS OF BREATH (Patient taking differently: Take 2.5 mg by nebulization every 6 (six) hours as needed for wheezing or shortness of breath. ) 270 mL 1   albuterol (VENTOLIN HFA) 108 (90 Base) MCG/ACT inhaler Inhale 2 puffs into the lungs every 6 (six) hours as needed for wheezing or shortness of breath. 6 g 1   allopurinol (ZYLOPRIM) 300 MG tablet TAKE 1 Tablet BY MOUTH ONCE DAILY 90 tablet 1   busPIRone (BUSPAR) 10 MG tablet Take 10 mg by mouth 3 (three) times daily.      citalopram (CELEXA) 20 MG tablet Take 40 mg by mouth daily.     docusate sodium (COLACE) 100 MG capsule Take 200 mg by mouth daily as needed for mild constipation.      DULERA 200-5 MCG/ACT AERO INHALE 2 PUFFS BY MOUTH TWICE DAILY. RINSE MOUTH AFTER EACH USE 39 g 1   furosemide (LASIX) 80 MG tablet Take 1 tablet (80 mg total) by mouth 2 (two) times daily. (Patient taking differently: Take 40 mg by mouth 2 (two) times daily. 2 tabs  (80mg ) bid) 180 tablet 3   ibuprofen (ADVIL) 800 MG tablet Take 800 mg by mouth every 8 (eight) hours as needed (back pain).     Krill Oil 350 MG CAPS Take 1 capsule by mouth daily.     lisinopril (ZESTRIL) 20 MG tablet TAKE 1 Tablet BY MOUTH ONCE DAILY 90 tablet 1   loratadine (CLARITIN) 10 MG tablet Take 10 mg by mouth daily as needed for allergies.     metolazone (ZAROXOLYN) 5 MG tablet Take 1 tablet (5 mg total) by mouth 2 (two) times a week. (Patient taking differently: Take 5 mg by mouth 2 (two) times a week. On Mondays and Friday) 10 tablet 11   metoprolol tartrate (LOPRESSOR) 100 MG tablet TAKE 1 Tablet  BY MOUTH  TWICE DAILY 180 tablet 0   potassium chloride (MICRO-K) 10 MEQ CR capsule Take 1 capsule (10 mEq total) by mouth daily. Take additional 2 Tablets on the Days That you take Metolazone 180 capsule 3   XARELTO 20 MG TABS tablet TAKE 1 Tablet BY MOUTH ONCE DAILY WITH SUPPER 90 tablet 0   metolazone (ZAROXOLYN) 5 MG tablet Take 1 tablet (5 mg total) by mouth every other day. 15 tablet 11   No facility-administered medications prior to visit.      Allergies:   Patient has no known allergies.   Social History   Socioeconomic History   Marital status: Divorced    Spouse name: Not on file   Number of children: Not on file   Years of education: Not on file   Highest education level: Not on file  Occupational History   Not on file  Social Needs   Financial resource strain: Somewhat hard   Food insecurity    Worry: Never true    Inability: Never true   Transportation needs    Medical: No    Non-medical: No  Tobacco Use   Smoking status: Former Smoker    Packs/day: 0.50    Types: Cigarettes    Quit date: 08/23/2017    Years since quitting: 1.4   Smokeless tobacco: Never Used  Substance and Sexual Activity   Alcohol use: No    Frequency: Never    Comment: have had alcohol in the past, not heavy   Drug use: No   Sexual activity: Not on file    Lifestyle   Physical activity    Days per week: Not on file    Minutes per session: Not on file   Stress: Not on file  Relationships   Social connections    Talks on phone: Not on file    Gets together: Not on file    Attends religious service: Not on file    Active member of club or organization: Not on file    Attends meetings of clubs or organizations: Not on file    Relationship status: Not on file  Other Topics Concern   Not on file  Social History Narrative   Not on file     Family History:  [name] family history includes Alzheimer's disease in his mother; Diabetes in his brother; Heart attack in his brother; Hypertension in his mother.   Review of Systems:   Please see the history of present illness.     General:  No chills, fever, night sweats or weight changes.  Cardiovascular:  No chest pain, orthopnea, palpitations, paroxysmal nocturnal dyspnea. Positive for dyspnea on exertion and edema.  Dermatological: No rash, lesions/masses Respiratory: No cough, dyspnea Urologic: No hematuria, dysuria Abdominal:   No nausea, vomiting, diarrhea, bright red blood per rectum, melena, or hematemesis Neurologic:  No visual changes, wkns, changes in mental status. Positive for dizziness.   All other systems reviewed and are otherwise negative except as noted above.   Physical Exam:    VS:  BP 127/69 (BP Location: Right Wrist)    Pulse 74    Temp 97.6 F (36.4 C)    Ht 5\' 11"  (1.803 m)    Wt (!) 497 lb (225.4 kg)    BMI 69.32 kg/m    General: Morbidly obese male appearing in no acute distress. Head: Normocephalic, atraumatic, sclera non-icteric, no xanthomas, nares are without discharge.  Neck: No carotid bruits. JVD unable to be assessed secondary to body  habitus.  Lungs: Respirations regular and unlabored, without wheezes or rales.  Heart: Regular rate and rhythm. No S3 or S4.  No rubs or gallops appreciated. 2/6 SEM along RUSB.  Abdomen: Soft, non-tender,  non-distended with normoactive bowel sounds. No hepatomegaly. No rebound/guarding. No obvious abdominal masses. Msk:  Strength and tone appear normal for age. No joint deformities or effusions. Extremities: No clubbing or cyanosis. 1+ pitting edema up to mid-shins bilaterally.  Distal pedal pulses are 2+ bilaterally. Neuro: Alert and oriented X 3. Moves all extremities spontaneously. No focal deficits noted. Psych:  Responds to questions appropriately with a normal affect. Skin: No rashes or lesions noted  Wt Readings from Last 3 Encounters:  02/13/19 (!) 497 lb (225.4 kg)  02/11/19 (!) 501 lb (227.3 kg)  01/20/19 (!) 496 lb (225 kg)     Studies/Labs Reviewed:   EKG:  EKG is not ordered today.    Recent Labs: 05/06/2018: B Natriuretic Peptide 113.0 11/14/2018: ALT 38; TSH 4.871 02/13/2019: BUN 26; Creatinine, Ser 1.09; Hemoglobin 13.4; Platelets 309; Potassium 3.2; Sodium 136   Lipid Panel    Component Value Date/Time   CHOL 159 11/14/2018 0949   TRIG 356 (H) 11/14/2018 0949   HDL 28 (L) 11/14/2018 0949   CHOLHDL 5.7 11/14/2018 0949   VLDL 71 (H) 11/14/2018 0949   LDLCALC 60 11/14/2018 0949    Additional studies/ records that were reviewed today include:   Echocardiogram: 03/2018 Study Conclusions  - Left ventricle: Extremely limited images despite use of Definity   contrast. Based on very limited views of the left ventricle, LVEF   looks to be grossly normal range. The cavity size was normal. The   estimated ejection fraction was 55%. Left ventricular diastolic   function parameters were normal. - Aortic valve: Reported to be status post placement of 25 mm   Edwards Inspiris Resilia stented bovine pericardial tissue valve.   Structure is poorly visualized. Mildly to moderately calcified   annulus. There was no regurgitation. Mean gradient (S): 23 mm Hg. - Mitral valve: Mildly calcified annulus. There was trivial   regurgitation. - Right ventricle: Poorly visualized.  The cavity size was mildly   dilated. Reported to have pacemaker in place - cannot visualize   wire except to limited degree in the right atrium.. - Tricuspid valve: Poorly visualized. There was trivial   regurgitation. - Pulmonary arteries: Systolic pressure could not be accurately   estimated. - Inferior vena cava: Not visualized. - Pericardium, extracardiac: There was no pericardial effusion.   Assessment:    1. Chronic diastolic congestive heart failure ([place])   2. Medication management   3. Aortic valve stenosis, etiology of cardiac valve disease unspecified   4. Paroxysmal atrial fibrillation (HCC)   5. CHB (complete heart block) (HCC)   6. Essential hypertension   7. OSA (obstructive sleep apnea)   8. Morbid obesity ([place])      Plan:   In order of problems listed above:  1. Chronic Diastolic CHF - His symptoms have improved with the use of Metolazone and weight has declined by 15 pounds since his last office visit. He is unable to afford Torsemide but has been able to obtain Lasix from [place] and is able to afford Metolazone with the use of [facility]. He has been having intermittent episodes of dizziness and I checked orthostatics today which were negative as SBP remained in the 120's and did not change significantly with positional changes. Will obtain a repeat BMET  and CBC.  - continue Lasix 80mg  BID and Metolazone 5mg  twice weekly. If renal function remains stable, would consider increasing Metolazone to three times weekly. Recommended reducing his fluid consumption.   2. Aortic Stenosis - s/p bovine pericardial tissue AVR in 08/2017. Echocardiogram in 03/2018 showed mild to moderate calcified annulus with no regurgitation.  3. Paroxysmal Atrial Fibrillation - He denies any recent palpitations and is maintaining normal sinus rhythm by examination today. Did have episodes of atrial fibrillation/flutter in 08/2018 but converted back to NSR. Continue Lopressor 100 mg  twice daily for rate-control and Xarelto for anticoagulation.   4. CHB - s/p PPM placement in 08/2017. Most recent interrogation in 01/2019 showed normal device function.  5. HTN - BP is well controlled at 127/69 during today's visit. He is currently on Lisinopril 20 mg daily and Lopressor 100 mg twice daily. Orthostatics were obtained and negative when checked today. Will continue to follow BP closely as Lisinopril may need to be reduced if recurrent dizziness.  6. OSA - Continued compliance with CPAP encouraged.    7. Morbid Obesity - BMI elevated at 69. He is meeting with a dietitian and trying to make modifications.   Medication Adjustments/Labs and Tests Ordered: Current medicines are reviewed at length with the patient today.  Concerns regarding medicines are outlined above.  Medication changes, Labs and Tests ordered today are listed in the Patient Instructions below. Patient Instructions  Medication Instructions: Your physician recommends that you continue on your current medications as directed. Please refer to the Current Medication list given to you today.   Labwork: Cbc,bmet today  Procedures/Testing: None today  Follow-Up: 2 months with [place], PA-C  Any Additional Special Instructions Will Be Listed Below (If Applicable).  If you need a refill on your cardiac medications before your next appointment, please call your pharmacy.   Thank you for choosing [facility] !         Signed, Erma Heritage, PA-C  02/13/2019 4:51 PM    [facility] [address] [address] [place], [place] [address] Phone: [phone] Fax: [phone]

## 2019-02-14 ENCOUNTER — Telehealth: Payer: Self-pay | Admitting: *Deleted

## 2019-02-14 DIAGNOSIS — Z79899 Other long term (current) drug therapy: Secondary | ICD-10-CM

## 2019-02-14 MED ORDER — POTASSIUM CHLORIDE ER 10 MEQ PO TBCR: 10.0000 meq | EXTENDED_RELEASE_TABLET | Freq: Every day | ORAL | 6 refills | Status: DC

## 2019-02-14 MED ORDER — METOLAZONE 5 MG PO TABS: 5.0000 mg | ORAL_TABLET | ORAL | 6 refills | Status: DC

## 2019-02-14 NOTE — Telephone Encounter (Signed)
Pt.notified

## 2019-02-14 NOTE — Telephone Encounter (Signed)
-----   Message from Erma Heritage, [place] sent at 02/14/2019  7:24 AM EDT ----- Please let the patient know his potassium is slightly low at 3.2.  Renal function remains stable.  Would recommend increasing Metolazone to three times weekly (M,W,F). Given his low-potassium he should take two additional K-dur tablets on the days he takes Metolazone (listed as this but he told me at his visit he was only taking two total, would now go to three on the days of Metolazone). Recheck BMET in 2 weeks with weight on office scales at that time.

## 2019-02-17 ENCOUNTER — Ambulatory Visit: Payer: Self-pay | Admitting: Physician Assistant

## 2019-02-26 ENCOUNTER — Encounter: Payer: Self-pay | Admitting: *Deleted

## 2019-03-05 ENCOUNTER — Other Ambulatory Visit (HOSPITAL_COMMUNITY)
Admission: RE | Admit: 2019-03-05 | Discharge: 2019-03-05 | Disposition: A | Discharge status: Home / Self Care | Payer: Self-pay | Source: Ambulatory Visit | Attending: Student | Admitting: Student

## 2019-03-05 ENCOUNTER — Other Ambulatory Visit (HOSPITAL_COMMUNITY)
Admission: RE | Admit: 2019-03-05 | Discharge: 2019-03-05 | Disposition: A | Discharge status: Home / Self Care | Payer: Self-pay | Source: Ambulatory Visit | Attending: Physician Assistant | Admitting: Physician Assistant

## 2019-03-05 ENCOUNTER — Other Ambulatory Visit: Payer: Self-pay | Admitting: Physician Assistant

## 2019-03-05 DIAGNOSIS — E781 Pure hyperglyceridemia: Secondary | ICD-10-CM

## 2019-03-05 DIAGNOSIS — Z79899 Other long term (current) drug therapy: Secondary | ICD-10-CM | POA: Insufficient documentation

## 2019-03-05 DIAGNOSIS — I10 Essential (primary) hypertension: Secondary | ICD-10-CM

## 2019-03-05 LAB — HEPATIC FUNCTION PANEL
ALT: 42 U/L (ref 0–44)
AST: 54 U/L — ABNORMAL HIGH (ref 15–41)
Albumin: 3.4 g/dL — ABNORMAL LOW (ref 3.5–5.0)
Alkaline Phosphatase: 74 U/L (ref 38–126)
Bilirubin, Direct: 0.2 mg/dL (ref 0.0–0.2)
Indirect Bilirubin: 0.6 mg/dL (ref 0.3–0.9)
Total Bilirubin: 0.8 mg/dL (ref 0.3–1.2)
Total Protein: 8 g/dL (ref 6.5–8.1)

## 2019-03-05 LAB — BASIC METABOLIC PANEL
Anion gap: 16 — ABNORMAL HIGH (ref 5–15)
BUN: 26 mg/dL — ABNORMAL HIGH (ref 6–20)
CO2: 30 mmol/L (ref 22–32)
Calcium: 9 mg/dL (ref 8.9–10.3)
Chloride: 92 mmol/L — ABNORMAL LOW (ref 98–111)
Creatinine, Ser: 1.19 mg/dL (ref 0.61–1.24)
GFR calc Af Amer: >60 mL/min (ref >60)
GFR calc non Af Amer: >60 mL/min (ref >60)
Glucose, Bld: 154 mg/dL — ABNORMAL HIGH (ref 70–99)
Potassium: 3.6 mmol/L (ref 3.5–5.1)
Sodium: 138 mmol/L (ref 135–145)

## 2019-03-05 LAB — LIPID PANEL
Cholesterol: 170 mg/dL (ref 0–200)
HDL: 27 mg/dL — ABNORMAL LOW (ref >40)
LDL Cholesterol: 71 mg/dL (ref 0–99)
Total CHOL/HDL Ratio: 6.3 RATIO
Triglycerides: 362 mg/dL — ABNORMAL HIGH (ref <150)
VLDL: 72 mg/dL — ABNORMAL HIGH (ref 0–40)

## 2019-03-05 LAB — TSH: TSH: 4.471 u[IU]/mL (ref 0.350–4.500)

## 2019-03-07 ENCOUNTER — Other Ambulatory Visit: Payer: Self-pay

## 2019-03-07 ENCOUNTER — Ambulatory Visit
Admission: EM | Admit: 2019-03-07 | Discharge: 2019-03-07 | Disposition: A | Discharge status: Home / Self Care | Payer: Self-pay

## 2019-03-07 DIAGNOSIS — R509 Fever, unspecified: Secondary | ICD-10-CM | POA: Insufficient documentation

## 2019-03-07 DIAGNOSIS — Z20828 Contact with and (suspected) exposure to other viral communicable diseases: Secondary | ICD-10-CM | POA: Insufficient documentation

## 2019-03-07 DIAGNOSIS — N10 Acute tubulo-interstitial nephritis: Secondary | ICD-10-CM | POA: Insufficient documentation

## 2019-03-07 DIAGNOSIS — Z20822 Contact with and (suspected) exposure to covid-19: Secondary | ICD-10-CM

## 2019-03-07 LAB — POCT URINALYSIS DIP (MANUAL ENTRY)
Glucose, UA: NEGATIVE mg/dL
Ketones, POC UA: NEGATIVE mg/dL
Nitrite, UA: POSITIVE — AB
Protein Ur, POC: 30 mg/dL — AB
Spec Grav, UA: 1.02 (ref 1.010–1.025)
Urobilinogen, UA: >=8 E.U./dL — AB
pH, UA: 7 (ref 5.0–8.0)

## 2019-03-07 MED ORDER — CIPROFLOXACIN HCL 500 MG PO TABS: 500.0000 mg | ORAL_TABLET | Freq: Two times a day (BID) | ORAL | 0 refills | Status: DC

## 2019-03-07 MED ORDER — CEFTRIAXONE SODIUM 1 G IJ SOLR: 1.0000 g | Freq: Once | INTRAMUSCULAR | Status: DC

## 2019-03-07 NOTE — ED Provider Notes (Signed)
[facility]   CC: UTI; COVID test  SUBJECTIVE:  Reginald [name] is a 48 y.o. male who complains of urinary frequency, dysuria, and fever, with tmax of 102, x 1 day.  Patient is morbidly obese and has difficulty making in to the bathroom in time.  Reports episodes of wetting himself.  Also reports a few episodes of diarrhea 2 days ago. Denies sick exposure to COVID, flu or strep.  Does admit to some flank and generalized abdominal discomfort. Discomfort is 4/10.  Has NOT tried OTC medications.  Symptoms are made worse with urination.  Denies similar symptoms in the past.  Complains of associated nausea, and hematuria. Denies fever, chills, rhinorrhea, congestion, sore throat, cough, chest pain, SOB, vomiting.    LMP: No LMP for male patient.  ROS: As in HPI.  All other pertinent ROS negative.     Past Medical History:  Diagnosis Date  . Anxiety   . Aortic stenosis   . Arthritis   . Asthma   . Back pain   . Cellulitis of skin with lymphangitis   . CHF (congestive heart failure) ([place])   . Chronic diastolic congestive heart failure ([place])   . Chronic venous insufficiency   . Constipation   . COPD (chronic obstructive pulmonary disease) ([place])   . Depression   . Dyspnea   . Essential hypertension   . Gout   . Heart murmur   . Hypertension   . Morbid obesity ([place])   . Obesity   . Pneumonia    walking pneumonia  . Pre-diabetes   . Prostatitis   . Pulmonary embolism ([place])   . Pulmonary hypertension ([place])   . S/P aortic valve replacement with bioprosthetic valve 08/23/2017   25 mm Edwards Inspiris Resilia stented bovine pericardial tissue valve  . 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 Sleep apnea    cpap   . Thoracic ascending aortic aneurysm ([place])   . Thoracic ascending aortic aneurysm ([place])   . Tobacco abuse    Past Surgical History:  Procedure Laterality Date  . AORTIC VALVE REPLACEMENT N/A 08/23/2017   Procedure: AORTIC VALVE REPLACEMENT (AVR) USING INSPIRIS RESILIA AORTIC VALVE SIZE 25 MM;  Surgeon: Rexene Alberts, MD;  Location: [place];  Service: Open Heart Surgery;  Laterality: N/A;  . MULTIPLE EXTRACTIONS WITH ALVEOLOPLASTY N/A 07/23/2017   Procedure: Extraction of tooth #10 with alveoloplasty and gross debridement of remaining teeth;  Surgeon: Lenn Cal, DDS;  Location: [place];  Service: Oral Surgery;  Laterality: N/A;  . NO PAST SURGERIES    . PACEMAKER IMPLANT N/A 08/28/2017    St Jude Medical Assurity MRI conditional  dual-chamber pacemaker for symptomatic complete heart blockby Dr Rayann Heman  . TEE WITHOUT CARDIOVERSION N/A 07/16/2017   Procedure: TRANSESOPHAGEAL ECHOCARDIOGRAM (TEE);  Surgeon: Lelon Perla, MD;  Location: [facility];  Service: Cardiovascular;  Laterality: N/A;  . TEE WITHOUT CARDIOVERSION N/A 08/23/2017   Procedure: TRANSESOPHAGEAL ECHOCARDIOGRAM (TEE);  Surgeon: Rexene Alberts, MD;  Location: [place];  Service: Open Heart Surgery;  Laterality: N/A;  . THORACIC AORTIC ANEURYSM REPAIR N/A 08/23/2017   Procedure: THORACIC ASCENDING ANEURYSM REPAIR (AAA) USING HEMASHIELD GOLD KNITTED MICROVEL DOUBLE VELOUR VASCULAR GRAFT D: 24 MM  L: 30 CM;  Surgeon: Rexene Alberts, MD;  Location: [place];  Service: Open Heart Surgery;  Laterality: N/A;   No Known Allergies No current facility-administered medications on file prior to encounter.  Current Outpatient Medications on File Prior to Encounter  Medication Sig Dispense Refill  . acetaminophen (TYLENOL) 325 MG tablet Take 650 mg by mouth every 6 (six) hours as needed for mild pain.    Marland Kitchen albuterol (PROVENTIL) (2.5 MG/3ML) 0.083% nebulizer solution INHALE 1 VIAL VIA NEBULIZER EVERY 6 HOURS AS NEEDED FOR FOR WHEEZING OR SHORTNESS OF BREATH (Patient taking differently: Take 2.5 mg by nebulization every 6 (six) hours as needed for wheezing or shortness of breath. ) 270 mL 1  . albuterol (VENTOLIN HFA) 108 (90 Base) MCG/ACT  inhaler Inhale 2 puffs into the lungs every 6 (six) hours as needed for wheezing or shortness of breath. 6 g 1  . allopurinol (ZYLOPRIM) 300 MG tablet TAKE 1 Tablet BY MOUTH ONCE DAILY 90 tablet 1  . busPIRone (BUSPAR) 10 MG tablet Take 10 mg by mouth 3 (three) times daily.     . citalopram (CELEXA) 20 MG tablet Take 40 mg by mouth daily.    Marland Kitchen docusate sodium (COLACE) 100 MG capsule Take 200 mg by mouth daily as needed for mild constipation.     . DULERA 200-5 MCG/ACT AERO INHALE 2 PUFFS BY MOUTH TWICE DAILY. RINSE MOUTH AFTER EACH USE 39 g 1  . furosemide (LASIX) 80 MG tablet Take 1 tablet (80 mg total) by mouth 2 (two) times daily. (Patient taking differently: Take 40 mg by mouth 2 (two) times daily. 2 tabs (80mg ) bid) 180 tablet 3  . ibuprofen (ADVIL) 800 MG tablet Take 800 mg by mouth every 8 (eight) hours as needed (back pain).    Javier Docker Oil 350 MG CAPS Take 1 capsule by mouth daily.    Marland Kitchen lisinopril (ZESTRIL) 20 MG tablet TAKE 1 Tablet BY MOUTH ONCE DAILY 90 tablet 1  . loratadine (CLARITIN) 10 MG tablet Take 10 mg by mouth daily as needed for allergies.    . metolazone (ZAROXOLYN) 5 MG tablet Take 1 tablet (5 mg total) by mouth every Monday, Wednesday, and Friday. 30 tablet 6  . metolazone (ZAROXOLYN) 5 MG tablet Take by mouth.    . metoprolol tartrate (LOPRESSOR) 100 MG tablet TAKE 1 Tablet  BY MOUTH TWICE DAILY 180 tablet 0  . potassium chloride (K-DUR) 10 MEQ tablet Take 1 tablet (10 mEq total) by mouth daily. Take 3 Tablets on Monday, Wednesday, Friday 90 tablet 6  . XARELTO 20 MG TABS tablet TAKE 1 Tablet BY MOUTH ONCE DAILY WITH SUPPER 90 tablet 0   Social History   Socioeconomic History  . Marital status: Divorced    Spouse name: Not on file  . Number of children: Not on file  . Years of education: Not on file  . Highest education level: Not on file  Occupational History  . Not on file  Social Needs  . Financial resource strain: Somewhat hard  . Food insecurity    Worry:  Never true    Inability: Never true  . Transportation needs    Medical: No    Non-medical: No  Tobacco Use  . Smoking status: Former Smoker    Packs/day: 0.50    Types: Cigarettes    Quit date: 08/23/2017    Years since quitting: 1.5  . Smokeless tobacco: Never Used  Substance and Sexual Activity  . Alcohol use: No    Frequency: Never    Comment: have had alcohol in the past, not heavy  . Drug use: No  . Sexual activity: Not on file  Lifestyle  . Physical activity  Days per week: Not on file    Minutes per session: Not on file  . Stress: Not on file  Relationships  . Social Herbalist on phone: Not on file    Gets together: Not on file    Attends religious service: Not on file    Active member of club or organization: Not on file    Attends meetings of clubs or organizations: Not on file    Relationship status: Not on file  . Intimate partner violence    Fear of current or ex partner: Not on file    Emotionally abused: Not on file    Physically abused: Not on file    Forced sexual activity: Not on file  Other Topics Concern  . Not on file  Social History Narrative  . Not on file   Family History  Problem Relation Age of Onset  . Hypertension Mother   . Alzheimer's disease Mother   . Heart attack Brother   . Diabetes Brother     OBJECTIVE:  Vitals:   03/07/19 1130 03/07/19 1131  BP: 112/72   Pulse: 99   Resp: (!) 26   Temp: (!) 100.4 F (38 C)   SpO2: 98% 98%   General appearance: Alert, appears uncomfortable; morbidly obese sitting in wheelchair HEENT: NCAT.  PERRL, EOMI grossly; Oropharynx clear.  Lungs: difficult to assess breath sounds due to body habitus Heart: regular rate and rhythm.   Abdomen: Exam limited due to body habitus and sitting in wheelchair: soft; protuberant, non-distended; mild diffuse tenderness about the upper abdomen; bowel sounds present; no guarding Back: no CVA tenderness Extremities: symmetrical with no gross  deformities; bilateral LE swelling, no pitting edema Skin: warm and dry Psychological: alert and cooperative; normal mood and affect  Labs Reviewed  POCT URINALYSIS DIP (MANUAL ENTRY) - Abnormal; Notable for the following components:      Result Value   Color, UA red (*)    Bilirubin, UA small (*)    Blood, UA trace-intact (*)    Protein Ur, POC =30 (*)    Urobilinogen, UA >=8.0 (*)    Nitrite, UA Positive (*)    Leukocytes, UA Small (1+) (*)    All other components within normal limits  NOVEL CORONAVIRUS, NAA  URINE CULTURE    ASSESSMENT & PLAN:  1. Suspected COVID-19 virus infection   2. Acute pyelonephritis   3. Fever, unspecified     Meds ordered this encounter  Medications  . ciprofloxacin (CIPRO) 500 MG tablet    Sig: Take 1 tablet (500 mg total) by mouth every 12 (twelve) hours for 7 days.    Dispense:  14 tablet    Refill:  0    Order Specific Question:   Supervising Provider    Answer:   Raylene Everts [id number]  . cefTRIAXone (ROCEPHIN) injection 1 g   Urine culture sent.  We will call you with abnormal results.   Push fluids and get plenty of rest.   1 gram of rocephin given in office Ciprofloxacin prescribed.  Take as directed and to completion Follow up tomorrow in [place] for recheck and consider additional rocephin injection Return here or go to ER if you have any new or worsening symptoms such as fever, worsening abdominal pain, nausea/vomiting, worsening flank pain, symptoms do not improve despite medications, etc...  Outlined signs and symptoms indicating need for more acute intervention. Patient verbalized understanding. After Visit Summary given.     Ophelia Sipe,  [place], PA-C 03/07/19 1209

## 2019-03-07 NOTE — ED Triage Notes (Signed)
Pt presents in wheelchair, states he has had frequent urination with  Burning and fever. Pt has  tachypnea but states this is baseline for him

## 2019-03-07 NOTE — Discharge Instructions (Signed)
Urine culture sent.  We will call you with abnormal results.   Push fluids and get plenty of rest.   1 gram of rocephin given in office Ciprofloxacin prescribed.  Take as directed and to completion Follow up tomorrow in [place] for recheck and consider additional rocephin injection Return here or go to ER if you have any new or worsening symptoms such as fever, worsening abdominal pain, nausea/vomiting, worsening flank pain, symptoms do not improve despite medications, etc..Marland Kitchen

## 2019-03-08 ENCOUNTER — Encounter (HOSPITAL_COMMUNITY): Payer: Self-pay | Admitting: *Deleted

## 2019-03-08 ENCOUNTER — Emergency Department (HOSPITAL_COMMUNITY): Payer: Self-pay

## 2019-03-08 ENCOUNTER — Ambulatory Visit (HOSPITAL_COMMUNITY)
Admission: EM | Admit: 2019-03-08 | Discharge: 2019-03-08 | Disposition: A | Discharge status: Home / Self Care | Payer: Self-pay | Attending: Family Medicine | Admitting: Family Medicine

## 2019-03-08 ENCOUNTER — Other Ambulatory Visit: Payer: Self-pay

## 2019-03-08 ENCOUNTER — Inpatient Hospital Stay (HOSPITAL_COMMUNITY)
Admission: EM | Admit: 2019-03-08 | Discharge: 2019-03-10 | DRG: 872 | Disposition: A | Discharge status: Home / Self Care | Payer: Self-pay | Attending: Internal Medicine | Admitting: Internal Medicine

## 2019-03-08 DIAGNOSIS — A419 Sepsis, unspecified organism: Secondary | ICD-10-CM | POA: Diagnosis present

## 2019-03-08 DIAGNOSIS — I480 Paroxysmal atrial fibrillation: Secondary | ICD-10-CM | POA: Diagnosis present

## 2019-03-08 DIAGNOSIS — Z20828 Contact with and (suspected) exposure to other viral communicable diseases: Secondary | ICD-10-CM | POA: Diagnosis present

## 2019-03-08 DIAGNOSIS — J449 Chronic obstructive pulmonary disease, unspecified: Secondary | ICD-10-CM | POA: Diagnosis present

## 2019-03-08 DIAGNOSIS — G4733 Obstructive sleep apnea (adult) (pediatric): Secondary | ICD-10-CM | POA: Diagnosis present

## 2019-03-08 DIAGNOSIS — K219 Gastro-esophageal reflux disease without esophagitis: Secondary | ICD-10-CM | POA: Diagnosis present

## 2019-03-08 DIAGNOSIS — A4151 Sepsis due to Escherichia coli [E. coli]: Principal | ICD-10-CM | POA: Diagnosis present

## 2019-03-08 DIAGNOSIS — N179 Acute kidney failure, unspecified: Secondary | ICD-10-CM

## 2019-03-08 DIAGNOSIS — Z79899 Other long term (current) drug therapy: Secondary | ICD-10-CM

## 2019-03-08 DIAGNOSIS — Z953 Presence of xenogenic heart valve: Secondary | ICD-10-CM

## 2019-03-08 DIAGNOSIS — N12 Tubulo-interstitial nephritis, not specified as acute or chronic: Secondary | ICD-10-CM

## 2019-03-08 DIAGNOSIS — E872 Acidosis: Secondary | ICD-10-CM | POA: Diagnosis present

## 2019-03-08 DIAGNOSIS — I5032 Chronic diastolic (congestive) heart failure: Secondary | ICD-10-CM | POA: Diagnosis present

## 2019-03-08 DIAGNOSIS — N390 Urinary tract infection, site not specified: Secondary | ICD-10-CM

## 2019-03-08 DIAGNOSIS — F329 Major depressive disorder, single episode, unspecified: Secondary | ICD-10-CM | POA: Diagnosis present

## 2019-03-08 DIAGNOSIS — E871 Hypo-osmolality and hyponatremia: Secondary | ICD-10-CM | POA: Diagnosis present

## 2019-03-08 DIAGNOSIS — Z7901 Long term (current) use of anticoagulants: Secondary | ICD-10-CM

## 2019-03-08 DIAGNOSIS — Z87891 Personal history of nicotine dependence: Secondary | ICD-10-CM

## 2019-03-08 DIAGNOSIS — Z86711 Personal history of pulmonary embolism: Secondary | ICD-10-CM

## 2019-03-08 DIAGNOSIS — I272 Pulmonary hypertension, unspecified: Secondary | ICD-10-CM | POA: Diagnosis present

## 2019-03-08 DIAGNOSIS — Z952 Presence of prosthetic heart valve: Secondary | ICD-10-CM

## 2019-03-08 DIAGNOSIS — E876 Hypokalemia: Secondary | ICD-10-CM | POA: Diagnosis present

## 2019-03-08 DIAGNOSIS — I110 Hypertensive heart disease with heart failure: Secondary | ICD-10-CM | POA: Diagnosis present

## 2019-03-08 DIAGNOSIS — Z87440 Personal history of urinary (tract) infections: Secondary | ICD-10-CM

## 2019-03-08 DIAGNOSIS — Z6841 Body Mass Index (BMI) 40.0 and over, adult: Secondary | ICD-10-CM

## 2019-03-08 DIAGNOSIS — B9620 Unspecified Escherichia coli [E. coli] as the cause of diseases classified elsewhere: Secondary | ICD-10-CM

## 2019-03-08 DIAGNOSIS — R42 Dizziness and giddiness: Secondary | ICD-10-CM

## 2019-03-08 DIAGNOSIS — F339 Major depressive disorder, recurrent, unspecified: Secondary | ICD-10-CM

## 2019-03-08 DIAGNOSIS — R011 Cardiac murmur, unspecified: Secondary | ICD-10-CM

## 2019-03-08 DIAGNOSIS — Z7951 Long term (current) use of inhaled steroids: Secondary | ICD-10-CM

## 2019-03-08 HISTORY — DX: Tubulo-interstitial nephritis, not specified as acute or chronic: N12

## 2019-03-08 LAB — BASIC METABOLIC PANEL
Anion gap: 16 — ABNORMAL HIGH (ref 5–15)
Anion gap: 15 (ref 5–15)
BUN: 22 mg/dL — ABNORMAL HIGH (ref 6–20)
BUN: 26 mg/dL — ABNORMAL HIGH (ref 6–20)
CO2: 28 mmol/L (ref 22–32)
CO2: 30 mmol/L (ref 22–32)
Calcium: 7.5 mg/dL — ABNORMAL LOW (ref 8.9–10.3)
Calcium: 7.3 mg/dL — ABNORMAL LOW (ref 8.9–10.3)
Chloride: 88 mmol/L — ABNORMAL LOW (ref 98–111)
Chloride: 89 mmol/L — ABNORMAL LOW (ref 98–111)
Creatinine, Ser: 1.51 mg/dL — ABNORMAL HIGH (ref 0.61–1.24)
Creatinine, Ser: 1.41 mg/dL — ABNORMAL HIGH (ref 0.61–1.24)
GFR calc Af Amer: >60 mL/min (ref >60)
GFR calc Af Amer: >60 mL/min (ref >60)
GFR calc non Af Amer: 54 mL/min — ABNORMAL LOW (ref >60)
GFR calc non Af Amer: 59 mL/min — ABNORMAL LOW (ref >60)
Glucose, Bld: 132 mg/dL — ABNORMAL HIGH (ref 70–99)
Glucose, Bld: 173 mg/dL — ABNORMAL HIGH (ref 70–99)
Potassium: 3.2 mmol/L — ABNORMAL LOW (ref 3.5–5.1)
Potassium: 2.7 mmol/L — CL (ref 3.5–5.1)
Sodium: 132 mmol/L — ABNORMAL LOW (ref 135–145)
Sodium: 134 mmol/L — ABNORMAL LOW (ref 135–145)

## 2019-03-08 LAB — URINALYSIS, ROUTINE W REFLEX MICROSCOPIC
Bilirubin Urine: NEGATIVE
Glucose, UA: NEGATIVE mg/dL
Hgb urine dipstick: NEGATIVE
Ketones, ur: NEGATIVE mg/dL
Nitrite: NEGATIVE
Protein, ur: 100 mg/dL — AB
Specific Gravity, Urine: 1.018 (ref 1.005–1.030)
pH: 5 (ref 5.0–8.0)

## 2019-03-08 LAB — POCT URINALYSIS DIP (DEVICE)
Bilirubin Urine: NEGATIVE
Glucose, UA: NEGATIVE mg/dL
Hgb urine dipstick: NEGATIVE
Ketones, ur: NEGATIVE mg/dL
Nitrite: NEGATIVE
Protein, ur: NEGATIVE mg/dL
Specific Gravity, Urine: 1.02 (ref 1.005–1.030)
Urobilinogen, UA: 2 mg/dL — ABNORMAL HIGH (ref 0.0–1.0)
pH: 6.5 (ref 5.0–8.0)

## 2019-03-08 LAB — CBC WITH DIFFERENTIAL/PLATELET
Abs Immature Granulocytes: 0.24 10*3/uL — ABNORMAL HIGH (ref 0.00–0.07)
Basophils Absolute: 0.1 10*3/uL (ref 0.0–0.1)
Basophils Relative: 0 %
Eosinophils Absolute: 0.4 10*3/uL (ref 0.0–0.5)
Eosinophils Relative: 2 %
HCT: 32.2 % — ABNORMAL LOW (ref 39.0–52.0)
Hemoglobin: 10.6 g/dL — ABNORMAL LOW (ref 13.0–17.0)
Immature Granulocytes: 1 %
Lymphocytes Relative: 9 %
Lymphs Abs: 2.1 10*3/uL (ref 0.7–4.0)
MCH: 27.1 pg (ref 26.0–34.0)
MCHC: 32.9 g/dL (ref 30.0–36.0)
MCV: 82.4 fL (ref 80.0–100.0)
Monocytes Absolute: 1 10*3/uL (ref 0.1–1.0)
Monocytes Relative: 4 %
Neutro Abs: 18.9 10*3/uL — ABNORMAL HIGH (ref 1.7–7.7)
Neutrophils Relative %: 84 %
Platelets: 310 10*3/uL (ref 150–400)
RBC: 3.91 MIL/uL — ABNORMAL LOW (ref 4.22–5.81)
RDW: 17.5 % — ABNORMAL HIGH (ref 11.5–15.5)
WBC: 22.7 10*3/uL — ABNORMAL HIGH (ref 4.0–10.5)
nRBC: 0 % (ref 0.0–0.2)

## 2019-03-08 LAB — POCT RAPID STREP A: Streptococcus, Group A Screen (Direct): NEGATIVE

## 2019-03-08 LAB — SARS CORONAVIRUS 2 (TAT 6-24 HRS): SARS Coronavirus 2: NEGATIVE

## 2019-03-08 LAB — LACTIC ACID, PLASMA
Lactic Acid, Venous: 2.7 mmol/L (ref 0.5–1.9)
Lactic Acid, Venous: 1.9 mmol/L (ref 0.5–1.9)

## 2019-03-08 LAB — HEPATIC FUNCTION PANEL
ALT: 29 U/L (ref 0–44)
AST: 33 U/L (ref 15–41)
Albumin: 3 g/dL — ABNORMAL LOW (ref 3.5–5.0)
Alkaline Phosphatase: 76 U/L (ref 38–126)
Bilirubin, Direct: 0.3 mg/dL — ABNORMAL HIGH (ref 0.0–0.2)
Indirect Bilirubin: 0.7 mg/dL (ref 0.3–0.9)
Total Bilirubin: 1 mg/dL (ref 0.3–1.2)
Total Protein: 7.8 g/dL (ref 6.5–8.1)

## 2019-03-08 LAB — HIV ANTIBODY (ROUTINE TESTING W REFLEX): HIV Screen 4th Generation wRfx: NONREACTIVE

## 2019-03-08 LAB — BRAIN NATRIURETIC PEPTIDE: B Natriuretic Peptide: 189.1 pg/mL — ABNORMAL HIGH (ref 0.0–100.0)

## 2019-03-08 LAB — TSH: TSH: 3.834 u[IU]/mL (ref 0.350–4.500)

## 2019-03-08 LAB — MAGNESIUM: Magnesium: 1.6 mg/dL — ABNORMAL LOW (ref 1.7–2.4)

## 2019-03-08 MED ORDER — SODIUM CHLORIDE 0.9 % IV BOLUS
1000.0000 mL | Freq: Once | INTRAVENOUS | Status: AC
  Administered 2019-03-08: 14:00:00 1000 mL via INTRAVENOUS

## 2019-03-08 MED ORDER — ONDANSETRON HCL 4 MG/2ML IJ SOLN: 4.0000 mg | Freq: Four times a day (QID) | INTRAMUSCULAR | Status: DC | PRN

## 2019-03-08 MED ORDER — ONDANSETRON HCL 4 MG PO TABS: 4.0000 mg | ORAL_TABLET | Freq: Four times a day (QID) | ORAL | Status: DC | PRN

## 2019-03-08 MED ORDER — ACETAMINOPHEN 325 MG PO TABS: 650.0000 mg | ORAL_TABLET | Freq: Four times a day (QID) | ORAL | Status: DC | PRN

## 2019-03-08 MED ORDER — RIVAROXABAN 20 MG PO TABS
20.0000 mg | ORAL_TABLET | Freq: Every day | ORAL | Status: DC
  Administered 2019-03-08 – 2019-03-10 (×3): 20 mg via ORAL
  Filled 2019-03-08 (×3): qty 1

## 2019-03-08 MED ORDER — CITALOPRAM HYDROBROMIDE 20 MG PO TABS
40.0000 mg | ORAL_TABLET | Freq: Every day | ORAL | Status: DC
  Administered 2019-03-09 – 2019-03-10 (×2): 40 mg via ORAL
  Filled 2019-03-08 (×2): qty 2

## 2019-03-08 MED ORDER — SODIUM CHLORIDE 0.9 % IV SOLN
1.0000 g | INTRAVENOUS | Status: DC
  Administered 2019-03-09 – 2019-03-10 (×2): 1 g via INTRAVENOUS
  Filled 2019-03-08 (×2): qty 1

## 2019-03-08 MED ORDER — ALBUTEROL SULFATE (2.5 MG/3ML) 0.083% IN NEBU: 2.5000 mg | INHALATION_SOLUTION | RESPIRATORY_TRACT | Status: DC | PRN

## 2019-03-08 MED ORDER — ACETAMINOPHEN 650 MG RE SUPP: 650.0000 mg | Freq: Four times a day (QID) | RECTAL | Status: DC | PRN

## 2019-03-08 MED ORDER — MOMETASONE FURO-FORMOTEROL FUM 200-5 MCG/ACT IN AERO
2.0000 | INHALATION_SPRAY | Freq: Two times a day (BID) | RESPIRATORY_TRACT | Status: DC
  Administered 2019-03-09 – 2019-03-10 (×3): 2 via RESPIRATORY_TRACT
  Filled 2019-03-08: qty 8.8

## 2019-03-08 MED ORDER — BUSPIRONE HCL 10 MG PO TABS
10.0000 mg | ORAL_TABLET | Freq: Three times a day (TID) | ORAL | Status: DC
  Administered 2019-03-08 – 2019-03-10 (×6): 10 mg via ORAL
  Filled 2019-03-08 (×6): qty 1

## 2019-03-08 MED ORDER — IOHEXOL 300 MG/ML  SOLN
100.0000 mL | Freq: Once | INTRAMUSCULAR | Status: AC | PRN
  Administered 2019-03-08: 100 mL via INTRAVENOUS

## 2019-03-08 MED ORDER — SODIUM CHLORIDE 0.9 % IV SOLN
1.0000 g | Freq: Once | INTRAVENOUS | Status: AC
  Administered 2019-03-08: 14:00:00 1 g via INTRAVENOUS
  Filled 2019-03-08: qty 10

## 2019-03-08 MED ORDER — HEPARIN SODIUM (PORCINE) 5000 UNIT/ML IJ SOLN: 5000.0000 [IU] | Freq: Three times a day (TID) | INTRAMUSCULAR | Status: DC

## 2019-03-08 MED ORDER — POTASSIUM CHLORIDE CRYS ER 20 MEQ PO TBCR
40.0000 meq | EXTENDED_RELEASE_TABLET | Freq: Once | ORAL | Status: AC
  Administered 2019-03-08: 20:00:00 40 meq via ORAL
  Filled 2019-03-08: qty 2

## 2019-03-08 NOTE — ED Provider Notes (Signed)
[facility] EMERGENCY DEPARTMENT Provider Note   CSN: [id number] Arrival date & time: 03/08/19  1210     History   Chief Complaint Chief Complaint  Patient presents with   Hypotension   Recurrent UTI    HPI Reginald [name] is a 48 y.o. male with h/o AS s/p AVR 2019, paroxysmal atrial fibrillation on lopressor and xarelto, CHG s/p PPM, bilateral PE, HTN, OSA, morbid obesity, chronic diastolic HF EF [id number] presents to the ER from urgent care for "an antibiotic booster shot".  Patient went to [facility] urgent care on 03/07/2019 for evaluation of urinary frequency, dysuria, fever of 102.  States urinary symptoms present for "weeks". He was diagnosed with a UTI, given rocephin shot and discharged with ciprofloxacin.  He presented to [facility] urgent care this morning because the urgent care at [facility] told him to go to another urgent care to get another antibiotic shot.  States from urgent care "they told me to come here for my blood pressure".  Difficult historian, is not sure why he needs more antibiotics.  He has taken 3 doses of ciprofloxacin so far.  Reports intermittent lightheadedness with standing up too fast.  States this is been going on for quite some time but in the last couple of days this has been worsening.  He denies any fever, chills, headaches, chest pain, palpitations, syncope.  Reports chronic exertional shortness of breath but this is unchanged.  He walked from urgent care to the ER and states he was winded but this was to be expected and at his baseline.  No abdominal, back or flank pain.  Feels like his urine is clear and his symptoms are slightly better.  No longer febrile in last 24 hours. Of note, he had 48 hours of non bloody non melenotic diarrhea that has resolved. This morning he woke up and felt discomfort in the throat, he ran his tongue over it and noticed blood.  Per UC, he has lesions in oropharynx. Strep test is pending. COVID test is pending fron  03/06/2019.      HPI  Past Medical History:  Diagnosis Date   Anxiety    Aortic stenosis    Arthritis    Asthma    Back pain    Cellulitis of skin with lymphangitis    CHF (congestive heart failure) (HCC)    Chronic diastolic congestive heart failure (HCC)    Chronic venous insufficiency    Constipation    COPD (chronic obstructive pulmonary disease) (HCC)    Depression    Dyspnea    Essential hypertension    Gout    Heart murmur    Hypertension    Morbid obesity ([place])    Obesity    Pneumonia    walking pneumonia   Pre-diabetes    Prostatitis    Pulmonary embolism (HCC)    Pulmonary hypertension (HCC)    Pyelonephritis    S/P aortic valve replacement with bioprosthetic valve 08/23/2017   25 mm Edwards Inspiris Resilia stented bovine pericardial tissue valve   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   24 mm Hemashield supracoronary straight graft    Sleep apnea    cpap    Thoracic ascending aortic aneurysm [facility])    Thoracic ascending aortic aneurysm (HCC)    Tobacco abuse     Patient Active Problem List   Diagnosis Date Noted   Sepsis ([place]) 03/08/2019   Paroxysmal atrial fibrillation ([place]) 09/05/2018   Chronic obstructive pulmonary disease ([place])  05/07/2018   Gastroesophageal reflux disease    Chest pain 04/22/2018   Pulmonary embolism ([place]) 09/18/2017   Fever 09/18/2017   Encounter for therapeutic drug monitoring 09/04/2017   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 08/23/2017   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   Pulmonary hypertension ([place])    Chronic periodontitis 07/18/2017   Super-super obese ([place])    Essential hypertension    Tobacco abuse    Acute on chronic diastolic heart failure ([place])    Chronic venous insufficiency     Past Surgical History:  Procedure Laterality Date   AORTIC VALVE REPLACEMENT N/A 08/23/2017   Procedure:  AORTIC VALVE REPLACEMENT (AVR) USING INSPIRIS RESILIA AORTIC VALVE SIZE 25 MM;  Surgeon: Rexene Alberts, MD;  Location: [place];  Service: Open Heart Surgery;  Laterality: N/A;   MULTIPLE EXTRACTIONS WITH ALVEOLOPLASTY N/A 07/23/2017   Procedure: Extraction of tooth #10 with alveoloplasty and gross debridement of remaining teeth;  Surgeon: Lenn Cal, DDS;  Location: [place];  Service: Oral Surgery;  Laterality: N/A;   NO PAST SURGERIES     PACEMAKER IMPLANT N/A 08/28/2017    St Jude Medical Assurity MRI conditional  dual-chamber pacemaker for symptomatic complete heart blockby Dr Rayann Heman   TEE WITHOUT CARDIOVERSION N/A 07/16/2017   Procedure: TRANSESOPHAGEAL ECHOCARDIOGRAM (TEE);  Surgeon: Lelon Perla, MD;  Location: [facility] ENDOSCOPY;  Service: Cardiovascular;  Laterality: N/A;   TEE WITHOUT CARDIOVERSION N/A 08/23/2017   Procedure: TRANSESOPHAGEAL ECHOCARDIOGRAM (TEE);  Surgeon: Rexene Alberts, MD;  Location: [place];  Service: Open Heart Surgery;  Laterality: N/A;   THORACIC AORTIC ANEURYSM REPAIR N/A 08/23/2017   Procedure: THORACIC ASCENDING ANEURYSM REPAIR (AAA) USING HEMASHIELD GOLD KNITTED MICROVEL DOUBLE VELOUR VASCULAR GRAFT D: 24 MM  L: 30 CM;  Surgeon: Rexene Alberts, MD;  Location: [place];  Service: Open Heart Surgery;  Laterality: N/A;        Home Medications    Prior to Admission medications   Medication Sig Start Date End Date Taking? Authorizing Provider  acetaminophen (TYLENOL) 325 MG tablet Take 650 mg by mouth every 6 (six) hours as needed for mild pain.   Yes [provider]  albuterol (PROVENTIL) (2.5 MG/3ML) 0.083% nebulizer solution INHALE 1 VIAL VIA NEBULIZER EVERY 6 HOURS AS NEEDED FOR FOR WHEEZING OR SHORTNESS OF BREATH Patient taking differently: Take 2.5 mg by nebulization every 6 (six) hours as needed for wheezing or shortness of breath.  08/08/18  Yes [name], PA-C  albuterol (VENTOLIN HFA) 108 (90 Base) MCG/ACT inhaler Inhale 2 puffs into  the lungs every 6 (six) hours as needed for wheezing or shortness of breath. 02/03/19  Yes [name], PA-C  allopurinol (ZYLOPRIM) 300 MG tablet TAKE 1 Tablet BY MOUTH ONCE DAILY Patient taking differently: Take 300 mg by mouth daily.  10/31/18  Yes [name], PA-C  busPIRone (BUSPAR) 10 MG tablet Take 10 mg by mouth 3 (three) times daily.  12/09/18  Yes [provider]  ciprofloxacin (CIPRO) 500 MG tablet Take 1 tablet (500 mg total) by mouth every 12 (twelve) hours for 7 days. 03/07/19 03/14/19 Yes Wurst, [place], PA-C  citalopram (CELEXA) 20 MG tablet Take 40 mg by mouth daily.   Yes [provider]  docusate sodium (COLACE) 100 MG capsule Take 200 mg by mouth daily as needed for mild constipation.    Yes [provider]  DULERA 200-5 MCG/ACT AERO INHALE 2 PUFFS BY MOUTH TWICE  DAILY. RINSE MOUTH AFTER EACH USE Patient taking differently: Inhale 2 puffs into the lungs 2 (two) times daily.  01/24/19  Yes [name], PA-C  furosemide (LASIX) 80 MG tablet Take 1 tablet (80 mg total) by mouth 2 (two) times daily. Patient taking differently: Take 40-80 mg by mouth See admin instructions. Take 1 tablet (40 mg totally) by mouth 2 times daily if feeling dehydrated; or 2 tablets (80 mg totally) by mouth 2 times daily 01/02/19  Yes Strader, [place] M, PA-C  Krill Oil 350 MG CAPS Take 1 capsule by mouth daily.   Yes [provider]  lisinopril (ZESTRIL) 20 MG tablet TAKE 1 Tablet BY MOUTH ONCE DAILY Patient taking differently: Take 20 mg by mouth daily.  10/31/18  Yes [name], PA-C  loratadine (CLARITIN) 10 MG tablet Take 10 mg by mouth daily as needed for allergies.   Yes [provider]  metolazone (ZAROXOLYN) 5 MG tablet Take 1 tablet (5 mg total) by mouth every Monday, Wednesday, and Friday. 02/14/19 05/15/19 Yes Strader, Fransisco Hertz, PA-C  metoprolol tartrate (LOPRESSOR) 100 MG tablet TAKE 1 Tablet  BY MOUTH TWICE DAILY Patient taking  differently: Take 100 mg by mouth 2 (two) times daily.  01/24/19  Yes [name], PA-C  potassium chloride (K-DUR) 10 MEQ tablet Take 1 tablet (10 mEq total) by mouth daily. Take 3 Tablets on Monday, Wednesday, Friday Patient taking differently: Take 10-30 mEq by mouth See admin instructions. Take 3 Tablets (30 mEq totally) by mouth on Monday, Wednesday, Friday and 1 tablet (10 mEq totally) by mouth other days 02/14/19 05/15/19 Yes Strader, [place] M, PA-C  XARELTO 20 MG TABS tablet TAKE 1 Tablet BY MOUTH ONCE DAILY WITH SUPPER Patient taking differently: Take 20 mg by mouth daily with supper.  01/24/19  Yes [name], PA-C    Family History Family History  Problem Relation Age of Onset   Hypertension Mother    Alzheimer's disease Mother    Heart attack Brother    Diabetes Brother     Social History Social History   Tobacco Use   Smoking status: Former Smoker    Packs/day: 0.50    Types: Cigarettes    Quit date: 08/23/2017    Years since quitting: 1.5   Smokeless tobacco: Never Used  Substance Use Topics   Alcohol use: No    Frequency: Never    Comment: have had alcohol in the past, not heavy   Drug use: No     Allergies   Patient has no known allergies.   Review of Systems Review of Systems  Genitourinary: Positive for difficulty urinating, dysuria and frequency.  Neurological: Positive for light-headedness.  All other systems reviewed and are negative.    Physical Exam Updated Vital Signs BP 132/66    Pulse 81    Temp 97.7 F (36.5 C) (Oral)    Resp 16    Ht 5\' 11"  (1.803 m)    Wt (!) 224.1 kg    SpO2 98%    BMI 68.90 kg/m   Physical Exam Vitals signs and nursing note reviewed.  Constitutional:      Appearance: He is well-developed.     Comments: Morbidly obese   HENT:     Head: Normocephalic and atraumatic.     Nose: Nose normal.     Mouth/Throat:     Tongue: Lesions present.     Comments: MMM. Poor dentition.  Beefy red flat lesions in  soft palate, tonsils, uvula.  No tonsillar hypertrophy, trismus.  Eyes:     Conjunctiva/sclera: Conjunctivae normal.  Neck:     Musculoskeletal: Normal range of motion.  Cardiovascular:     Rate and Rhythm: Normal rate and regular rhythm.     Heart sounds: Normal heart sounds.  Pulmonary:     Effort: Pulmonary effort is normal.     Breath sounds: Decreased breath sounds present.     Comments: Diminished air entry in mid/lower lobes super morbid obese patient limits exam Abdominal:     General: Bowel sounds are normal.     Palpations: Abdomen is soft.     Tenderness: There is abdominal tenderness.     Comments: Abdominal exam limited due to body habitus.  Soft. No suprapubic tenderness. Mild bilateral CVAT with percussion but also palpation.    Musculoskeletal: Normal range of motion.  Skin:    General: Skin is warm and dry.     Capillary Refill: Capillary refill takes less than 2 seconds.  Neurological:     Mental Status: He is alert.  Psychiatric:        Behavior: Behavior normal.      ED Treatments / Results  Labs (all labs ordered are listed, but only abnormal results are displayed) Labs Reviewed  CBC WITH DIFFERENTIAL/PLATELET - Abnormal; Notable for the following components:      Result Value   WBC 22.7 (*)    RBC 3.91 (*)    Hemoglobin 10.6 (*)    HCT 32.2 (*)    RDW 17.5 (*)    Neutro Abs 18.9 (*)    Abs Immature Granulocytes 0.24 (*)    All other components within normal limits  BASIC METABOLIC PANEL - Abnormal; Notable for the following components:   Sodium 132 (*)    Potassium 3.2 (*)    Chloride 88 (*)    Glucose, Bld 132 (*)    BUN 22 (*)    Creatinine, Ser 1.51 (*)    Calcium 7.5 (*)    GFR calc non Af Amer 54 (*)    Anion gap 16 (*)    All other components within normal limits  LACTIC ACID, PLASMA - Abnormal; Notable for the following components:   Lactic Acid, Venous 2.7 (*)    All other components within normal limits  URINALYSIS, ROUTINE W  REFLEX MICROSCOPIC - Abnormal; Notable for the following components:   Color, Urine AMBER (*)    APPearance HAZY (*)    Protein, ur 100 (*)    Leukocytes,Ua MODERATE (*)    Bacteria, UA FEW (*)    Non Squamous Epithelial 0-5 (*)    All other components within normal limits  HEPATIC FUNCTION PANEL - Abnormal; Notable for the following components:   Albumin 3.0 (*)    Bilirubin, Direct 0.3 (*)    All other components within normal limits  MAGNESIUM - Abnormal; Notable for the following components:   Magnesium 1.6 (*)    All other components within normal limits  BRAIN NATRIURETIC PEPTIDE - Abnormal; Notable for the following components:   B Natriuretic Peptide 189.1 (*)    All other components within normal limits  SARS CORONAVIRUS 2 (TAT 6-24 HRS)  CULTURE, BLOOD (ROUTINE X 2)  CULTURE, BLOOD (ROUTINE X 2)  URINE CULTURE  LACTIC ACID, PLASMA  HIV ANTIBODY (ROUTINE TESTING W REFLEX)  HIV4GL SAVE TUBE  TSH  HEMOGLOBIN A1C  COMPREHENSIVE METABOLIC PANEL  CBC  BASIC METABOLIC PANEL    EKG EKG Interpretation  Date/Time:  Saturday March 08 2019 12:27:24 EDT Ventricular Rate:  72 PR Interval:    QRS Duration: 164 QT Interval:  478 QTC Calculation: 524 R Axis:   113 Text Interpretation:  Sinus rhythm Right bundle branch block Anteroseptal infarct, age indeterminate No significant changes from April 2020 ecg, No STEMI  Confirmed by Octaviano Glow [phone]) on 03/08/2019 12:37:05 PM   Radiology Dg Chest 1 View  Result Date: 03/08/2019 CLINICAL DATA:  Infection workup. Palate pain and bleeding. Dyspnea. EXAM: CHEST  1 VIEW COMPARISON:  May 06, 2018 FINDINGS: Stable cardiomegaly. Stable pacemaker. No pneumothorax. The lungs are clear. No other acute abnormalities. IMPRESSION: No acute abnormality. Electronically Signed   By: Dorise Bullion III M.D   On: 03/08/2019 14:31   Ct Abdomen Pelvis W Contrast  Result Date: 03/08/2019 CLINICAL DATA:  48 year old male with  history of bilateral CVAT. EXAM: CT ABDOMEN AND PELVIS WITH CONTRAST TECHNIQUE: Multidetector CT imaging of the abdomen and pelvis was performed using the standard protocol following bolus administration of intravenous contrast. CONTRAST:  11mL OMNIPAQUE IOHEXOL 300 MG/ML  SOLN COMPARISON:  No priors. FINDINGS: Lower chest: Pacemaker leads in the right atrium and right ventricle. Hepatobiliary: Severe diffuse low attenuation throughout the hepatic parenchyma, indicative of severe hepatic steatosis. No discrete cystic or solid hepatic lesions. No intra or extrahepatic biliary ductal dilatation. Gallbladder is normal in appearance. Pancreas: No pancreatic mass. No pancreatic ductal dilatation. No pancreatic or peripancreatic fluid collections or inflammatory changes. Spleen: Spleen is enlarged measuring 19.2 x 7.3 x 11.9 cm (estimated splenic volume of 834 mL). Adrenals/Urinary Tract: Bilateral kidneys and right adrenal gland are normal in appearance. 1.8 x 1.2 cm fatty attenuation lesion in the left adrenal gland (axial image 36 of series 3), compatible with a benign myelolipoma. No hydroureteronephrosis. Urinary bladder is normal in appearance. Stomach/Bowel: Normal appearance of the stomach. No pathologic dilatation of small bowel or colon. Normal appendix. Vascular/Lymphatic: Aortic atherosclerosis, without evidence of aneurysm or dissection in the abdominal or pelvic vasculature. No lymphadenopathy noted in the abdomen or pelvis. Reproductive: Prostate gland and seminal vesicles are unremarkable in appearance. Other: No significant volume of ascites.  No pneumoperitoneum. Musculoskeletal: There are no aggressive appearing lytic or blastic lesions noted in the visualized portions of the skeleton. IMPRESSION: 1. No acute findings are noted in the abdomen or pelvis to account for the patient's symptoms. 2. Severe hepatic steatosis. 3. Splenomegaly. 4. Small left adrenal myelolipoma. 5. Aortic atherosclerosis.  Electronically Signed   By: Vinnie Langton M.D.   On: 03/08/2019 15:47    Procedures .Critical Care Performed by: Kinnie Feil, PA-C Authorized by: Kinnie Feil, PA-C   Critical care provider statement:    Critical care time (minutes):  45   Critical care was necessary to treat or prevent imminent or life-threatening deterioration of the following conditions:  Sepsis   Critical care was time spent personally by me on the following activities:  Discussions with consultants, evaluation of patient's response to treatment, examination of patient, ordering and performing treatments and interventions, ordering and review of laboratory studies, ordering and review of radiographic studies, pulse oximetry, re-evaluation of patient's condition, obtaining history from patient or surrogate, review of old charts and development of treatment plan with patient or surrogate   I assumed direction of critical care for this patient from another provider in my specialty: no     (including critical care time)  Medications Ordered in ED Medications  potassium chloride SA (KLOR-CON) CR tablet 40 mEq (has  no administration in time range)  acetaminophen (TYLENOL) tablet 650 mg (has no administration in time range)    Or  acetaminophen (TYLENOL) suppository 650 mg (has no administration in time range)  ondansetron (ZOFRAN) tablet 4 mg (has no administration in time range)    Or  ondansetron (ZOFRAN) injection 4 mg (has no administration in time range)  albuterol (PROVENTIL) (2.5 MG/3ML) 0.083% nebulizer solution 2.5 mg (has no administration in time range)  citalopram (CELEXA) tablet 40 mg (has no administration in time range)  busPIRone (BUSPAR) tablet 10 mg (has no administration in time range)  mometasone-formoterol (DULERA) 200-5 MCG/ACT inhaler 2 puff (has no administration in time range)  rivaroxaban (XARELTO) tablet 20 mg (has no administration in time range)  sodium chloride 0.9 % bolus  1,000 mL (0 mLs Intravenous Stopped 03/08/19 1555)  cefTRIAXone (ROCEPHIN) 1 g in sodium chloride 0.9 % 100 mL IVPB (0 g Intravenous Stopped 03/08/19 1453)  iohexol (OMNIPAQUE) 300 MG/ML solution 100 mL (100 mLs Intravenous Contrast Given 03/08/19 1521)     Initial Impression / Assessment and Plan / ED Course  I have reviewed the triage vital signs and the nursing notes.  Pertinent labs & imaging results that were available during my care of the patient were reviewed by me and considered in my medical decision making (see chart for details).  Clinical Course as of Mar 07 1941  Sat Mar 08, 2019  1328 WBC(!): 22.7 [CG]  1329 Hemoglobin(!): 10.6 [CG]  1344 WBC(!): 22.7 [CG]  1344 Lactic Acid, Venous(!!): 2.7 [CG]  1426 Creatinine(!): 1.51 [CG]  1426 Glucose(!): 132 [CG]  1426 Anion gap(!): 16 [CG]  1426 Potassium(!): 3.2 [CG]  1431 The patient was seen by myself as well as PA provider.  Briefly is a 48 year old morbidly obese male with a history of heart failure, hypertension, hyperlipidemia, presenting to the emergency department with UTI and concern for sepsis.  The patient was referred into the emergency department from urgent care after was found to have a UTI and was noted to be hypotensive with a blood pressure of 90 systolic.  The patient has no acute complaints.  He notes that his urine has been dark and has been complaining of mild bilateral flank pain for several days.  He states he was given ceftriaxone a few days ago and started on ciprofloxacin p.o. 2 days ago.  This was for presumptive UTI.  On exam the patient's blood pressure is more stable here around [id number] systolic.  He is afebrile and his vital signs are within normal limits.  He is morbidly obese.  He has very mild bilateral flank tenderness.  He is eating and otherwise appears comfortable.  His labs are notable for significant leukocytosis and neutrophilia.  His lactate is elevated 2.7.  He is also mildly hypokalemic and  demonstrates with an elevation of his creatinine.  I suspect he most likely has urosepsis.  I am concerned about possible pyelonephritis with his flank pain.  I believe would benefit from a CT scan with IV contrast to look for pyelonephritis.  We will give him IV fluids to hydrate his kidneys prior to this.  We will also send blood cultures and have ordered a sepsis work-up.  IV ceftriaxone for likely E. coli sepsis.   [MT]  [id number] He has no active pulmonary symptoms.  However he does report he is have some diarrhea and GI upset for past few days.  I believe it is reasonable to send a COVID-19 test.   [  MT]  1433 This note was dictated using dragon dictation software.  Please be aware that there may be minor translation errors as a result of this oral dictation   [MT]  1503 Chalmers Guest): MODERATE [CG]  1503 WBC, UA: 21-50 [CG]  1503 Bacteria, UA(!): FEW [CG]    Clinical Course User Index [CG] Kinnie Feil, PA-C [MT] Langston Masker, Carola Rhine, MD   EMR reviewd.   UC visits 10/16 and today reviewed.  UA on 10/16 grossly infected with nitrites, leukocytes.  Urine culture pending.  Hypotensive SBP 98 at UC with light headedness.    Ddx inclues hypotension from infectious process.  SIRS vs sepsis response to UTI, urosepsis.    He denies CP on exertion, changes to chronic DOE, syncope.  No cough.  Has had resolved diarrhea and painful erythematous mucosal lesions? Strep test done at [facility] this morning negative. COVID test yesterday at [facility] pending.    ER work up reviewed by me remarkable for moderate leuks, 21-50 WBC, few bacteria confirming UTI.  Mild hypokalemia.  Leukocytosis 22.9 and lactic acidosis 2.7. Creatinine mildly elevated from previous.   Given report of soft BP, elevated RR, temp 100.4 here meets SIRS criteria.  Leukocytosis, lactic acidosis concern for SIRS/sepsis from UTI.  Several med risk factors for easy decompensation.   Abx and IVF given here.  Repeat evaluation, no clinical  decline.  Discussed with hospitalist who will admit. Shared with EDP.  Final Clinical Impressions(s) / ED Diagnoses   Final diagnoses:  Sepsis due to Escherichia coli without acute organ dysfunction [facility])    ED Discharge Orders    None       Kinnie Feil, PA-C 03/08/19 1942    Wyvonnia Dusky, MD 03/09/19 [phone]

## 2019-03-08 NOTE — ED Notes (Signed)
Pt up at bedside, reviewed POC and tests.  Chewing on ice without gross distress noted.

## 2019-03-08 NOTE — H&P (Signed)
Date: 03/08/2019               Patient Name:  Reginald [name] MRN: [id number]  DOB: March 06, 1971 Age / Sex: 48 y.o., male   PCP: [name], PA-C         Medical Service: Internal Medicine Teaching Service         Attending Physician: Dr. Evette Doffing, Mallie Mussel, *    First Contact: Dr. Gilford Rile Pager: [phone]  Second Contact: Dr. Myrtie Hawk Pager: [phone]       After Hours (After 5p/  First Contact Pager: [phone]  weekends / holidays): Second Contact Pager: [phone]   Chief Complaint: Hypotension  History of Present Illness:  Reginald [name] is a 48 y/o male, with a PMHx of OSA, AAA, pyelonephritis, PE, morbid obesity, HTN, COPD, CHF, and asthma, presents to the ED with hypotension. He states that he was "tricked" to come in because he had hypotension while being treated for pyelonephritis. He states that he was being treated for a UTI that evolved into pyelonephritis. He states that he typically has hypertension, but when he went to the urgent care today to receive antibiotics for his pyelonephritis he was referred to the [facility] for hypotension. He does have a PMH of CHF for which he takes at home Lasix, but has noticed a dramatic decrease in his urination. He also states that his urine has been very dark recently and that he had several bouts of diarrhea before two days before admission. He also has complaints of lower back pain on palpation. He denies fevers, nausea, vomiting  During his ED course it was found that he had leukocytosis (WBC: 22.7), fever (100.4 F),  Soft pressures (112/72), lactic acidosis at 2.7 which resolved to 1.9 after bolus, with a BNP with a slight increase from his last lab (189).   Meds:  Current Meds  Medication Sig  . acetaminophen (TYLENOL) 325 MG tablet Take 650 mg by mouth every 6 (six) hours as needed for mild pain.  Marland Kitchen albuterol (PROVENTIL) (2.5 MG/3ML) 0.083% nebulizer solution INHALE 1 VIAL VIA NEBULIZER EVERY 6 HOURS AS NEEDED FOR FOR WHEEZING OR  SHORTNESS OF BREATH (Patient taking differently: Take 2.5 mg by nebulization every 6 (six) hours as needed for wheezing or shortness of breath. )  . albuterol (VENTOLIN HFA) 108 (90 Base) MCG/ACT inhaler Inhale 2 puffs into the lungs every 6 (six) hours as needed for wheezing or shortness of breath.  . allopurinol (ZYLOPRIM) 300 MG tablet TAKE 1 Tablet BY MOUTH ONCE DAILY (Patient taking differently: Take 300 mg by mouth daily. )  . busPIRone (BUSPAR) 10 MG tablet Take 10 mg by mouth 3 (three) times daily.   . ciprofloxacin (CIPRO) 500 MG tablet Take 1 tablet (500 mg total) by mouth every 12 (twelve) hours for 7 days.  . citalopram (CELEXA) 20 MG tablet Take 40 mg by mouth daily.  Marland Kitchen docusate sodium (COLACE) 100 MG capsule Take 200 mg by mouth daily as needed for mild constipation.   . DULERA 200-5 MCG/ACT AERO INHALE 2 PUFFS BY MOUTH TWICE DAILY. RINSE MOUTH AFTER EACH USE (Patient taking differently: Inhale 2 puffs into the lungs 2 (two) times daily. )  . furosemide (LASIX) 80 MG tablet Take 1 tablet (80 mg total) by mouth 2 (two) times daily. (Patient taking differently: Take 40-80 mg by mouth See admin instructions. Take 1 tablet (40 mg totally) by mouth 2 times daily if feeling dehydrated; or 2 tablets (80 mg totally)  by mouth 2 times daily)  . Krill Oil 350 MG CAPS Take 1 capsule by mouth daily.  Marland Kitchen lisinopril (ZESTRIL) 20 MG tablet TAKE 1 Tablet BY MOUTH ONCE DAILY (Patient taking differently: Take 20 mg by mouth daily. )  . loratadine (CLARITIN) 10 MG tablet Take 10 mg by mouth daily as needed for allergies.  . metolazone (ZAROXOLYN) 5 MG tablet Take 1 tablet (5 mg total) by mouth every Monday, Wednesday, and Friday.  . metoprolol tartrate (LOPRESSOR) 100 MG tablet TAKE 1 Tablet  BY MOUTH TWICE DAILY (Patient taking differently: Take 100 mg by mouth 2 (two) times daily. )  . potassium chloride (K-DUR) 10 MEQ tablet Take 1 tablet (10 mEq total) by mouth daily. Take 3 Tablets on Monday, Wednesday,  Friday (Patient taking differently: Take 10-30 mEq by mouth See admin instructions. Take 3 Tablets (30 mEq totally) by mouth on Monday, Wednesday, Friday and 1 tablet (10 mEq totally) by mouth other days)  . XARELTO 20 MG TABS tablet TAKE 1 Tablet BY MOUTH ONCE DAILY WITH SUPPER (Patient taking differently: Take 20 mg by mouth daily with supper. )     Allergies: Allergies as of 03/08/2019  . (No Known Allergies)   Past Medical History:  Diagnosis Date  . Anxiety   . Aortic stenosis   . Arthritis   . Asthma   . Back pain   . Cellulitis of skin with lymphangitis   . CHF (congestive heart failure) ([place])   . Chronic diastolic congestive heart failure ([place])   . Chronic venous insufficiency   . Constipation   . COPD (chronic obstructive pulmonary disease) ([place])   . Depression   . Dyspnea   . Essential hypertension   . Gout   . Heart murmur   . Hypertension   . Morbid obesity ([place])   . Obesity   . Pneumonia    walking pneumonia  . Pre-diabetes   . Prostatitis   . Pulmonary embolism ([place])   . Pulmonary hypertension ([place])   . Pyelonephritis   . S/P aortic valve replacement with bioprosthetic valve 08/23/2017   25 mm Edwards Inspiris Resilia stented bovine pericardial tissue valve  . 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 Sleep apnea    cpap   . Thoracic ascending aortic aneurysm ([place])   . Thoracic ascending aortic aneurysm ([place])   . Tobacco abuse     Family History:  Mother with HTN, and a brother with heart disease and diabetes.   Social History:  Lives at home by himself He quit smoking April 2019 He denies alcohol use He denies illicit drug useage  Review of Systems: A complete ROS was negative except as per HPI.  Physical Exam: Blood pressure 119/79, pulse 80, temperature 97.7 F (36.5 C), temperature source Oral, resp. rate 16, height 5\' 11"  (1.803 m), weight (!) 224.1 kg, SpO2 98 %. Physical Exam Vitals  signs and nursing note reviewed.  Constitutional:      General: He is not in acute distress.    Appearance: He is obese. He is not ill-appearing, toxic-appearing or diaphoretic.     Comments: Morbidly obese male, sitting at bedside, anxious appearing, cooperative. Does not appear to be in acute distress.   HENT:     Head: Normocephalic and atraumatic.  Cardiovascular:     Rate and Rhythm: Normal rate and regular rhythm.     Heart sounds: Murmur present. No friction rub. No gallop.  Comments: Systolic murmur appreciated at all post on auscultation.  Pulmonary:     Effort: Pulmonary effort is normal.     Breath sounds: No wheezing, rhonchi or rales.     Comments: Diminished breath sounds appreciated in the lower lobes bilaterally.  Abdominal:     General: Abdomen is flat. Bowel sounds are normal.     Tenderness: There is no abdominal tenderness. There is right CVA tenderness and left CVA tenderness. There is no guarding.  Musculoskeletal:     Right lower leg: No edema.     Left lower leg: No edema.  Neurological:     Mental Status: He is alert and oriented to person, place, and time.  Psychiatric:     Comments: Anxious in appearance.     EKG: personally reviewed my interpretation is Sinus rhythm with RBBB  CXR: personally reviewed my interpretation is no acute abnormalities  CT Abdomen Pelvis:  IMPRESSION: 1. No acute findings are noted in the abdomen or pelvis to account for the patient's symptoms. 2. Severe hepatic steatosis. 3. Splenomegaly. 4. Small left adrenal myelolipoma. 5. Aortic atherosclerosis  Assessment & Plan by Problem: Active Problems:   Sepsis ([place])  Assessment:  Reginald [name] is a 48 y/o male, with a PMHx of OSA, AAA, pyelonephritis, PE, morbid obesity, HTN, COPD, CHF, and asthma, who presents with sepsis.   Patient presents to [facility] with SIRS criteria for sepsis with identifiable source. Patient's respiratory rate has been >20 with a leukocytosis of  22.7 with a likely source being his recent pyelonephritis. Patient has been shown to be hypotensive, and has had an increase in his creatinine to 1.54 from his baseline of 1.09 3 weeks ago. He has noticed a decrease in his urine output stating that "I usually can fill a large [employer] cup, but now I'm putting out a vial." He will continue to be carefully monitored.   Sepsis:  - Continue Ceftriaxone 1g IV - Continue following LA, CMP, CBC, BMP - Continue Following sepsis protocol  - Follow up urine culture - Diet: Clear liquid - Continue monitoring Vitals  Asthma:  - Continue Dulera 200-5 MCG/ACT - Continue Proventil 2.5 mg - Continue O2 therapy   OSA:  - CPAP PRN  Aortic Valve Replacement:  - Continue Xarelto 20 mg  Depression:  - Continue Buspar 10 mg TID - Continue Celexa 40 mg QD   Dispo: Admit patient to Inpatient with expected length of stay greater than 2 midnights.  Signed: Maudie Mercury, MD 03/08/2019, 5:30 PM  Pager: [phone]

## 2019-03-08 NOTE — ED Provider Notes (Addendum)
[place]    CSN: [id number] Arrival date & time: 03/08/19  1012      History   Chief Complaint Chief Complaint  Patient presents with  . Oral Pain  . Follow-up    HPI Reginald [name] is a 48 y.o. male history of diastolic CHF, venous insufficiency, COPD, hypertension, pulmonary hypertension, obesity, presenting today for follow-up of pyelonephritis and dizziness.  Patient was seen at [facility] urgent care yesterday with UA suggestive of UTI, fever.  He was given a gram of Rocephin and sent home with Cipro to treat for pyelonephritis.  Patient has taken 3 doses.  He feels that he is slightly better, but continues to feel dizzy and weak and tired.  He denies worsening of the symptoms.  He also had COVID screening that is still pending due to his fever. He feels his urine is slightly clearer today.  He also notes that in the last 24 hours he has developed a sore throat and soreness to the roof of his mouth, he noticed some blood.  Patient had aortic valve replacement in April.  HPI  Past Medical History:  Diagnosis Date  . Anxiety   . Aortic stenosis   . Arthritis   . Asthma   . Back pain   . Cellulitis of skin with lymphangitis   . CHF (congestive heart failure) ([place])   . Chronic diastolic congestive heart failure ([place])   . Chronic venous insufficiency   . Constipation   . COPD (chronic obstructive pulmonary disease) ([place])   . Depression   . Dyspnea   . Essential hypertension   . Gout   . Heart murmur   . Hypertension   . Morbid obesity ([place])   . Obesity   . Pneumonia    walking pneumonia  . Pre-diabetes   . Prostatitis   . Pulmonary embolism ([place])   . Pulmonary hypertension ([place])   . Pyelonephritis   . S/P aortic valve replacement with bioprosthetic valve 08/23/2017   25 mm Edwards Inspiris Resilia stented bovine pericardial tissue valve  . 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 Sleep apnea    cpap   . Thoracic ascending aortic aneurysm ([place])   . Thoracic ascending aortic aneurysm ([place])   . Tobacco abuse     Patient Active Problem List   Diagnosis Date Noted  . Paroxysmal atrial fibrillation ([place]) 09/05/2018  . Chronic obstructive pulmonary disease ([place]) 05/07/2018  . Gastroesophageal reflux disease   . Chest pain 04/22/2018  . Pulmonary embolism ([place]) 09/18/2017  . Fever 09/18/2017  . Encounter for therapeutic drug monitoring 09/04/2017  . 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 08/23/2017  . 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 Pulmonary hypertension ([place])   . Chronic periodontitis 07/18/2017  . Super-super obese ([place])   . Essential hypertension   . Tobacco abuse   . Acute on chronic diastolic heart failure ([place])   . Chronic venous insufficiency     Past Surgical History:  Procedure Laterality Date  . AORTIC VALVE REPLACEMENT N/A 08/23/2017   Procedure: AORTIC VALVE REPLACEMENT (AVR) USING INSPIRIS RESILIA AORTIC VALVE SIZE 25 MM;  Surgeon: Rexene Alberts, MD;  Location: [place] [facility];  Service: Open Heart Surgery;  Laterality: N/A;  . MULTIPLE EXTRACTIONS WITH ALVEOLOPLASTY N/A 07/23/2017   Procedure: Extraction of tooth #10 with alveoloplasty and gross debridement of remaining teeth;  Surgeon: Lenn Cal,  DDS;  Location: [place];  Service: Oral Surgery;  Laterality: N/A;  . NO PAST SURGERIES    . PACEMAKER IMPLANT N/A 08/28/2017    St Jude Medical Assurity MRI conditional  dual-chamber pacemaker for symptomatic complete heart blockby Dr Rayann Heman  . TEE WITHOUT CARDIOVERSION N/A 07/16/2017   Procedure: TRANSESOPHAGEAL ECHOCARDIOGRAM (TEE);  Surgeon: Lelon Perla, MD;  Location: [facility] ENDOSCOPY;  Service: Cardiovascular;  Laterality: N/A;  . TEE WITHOUT CARDIOVERSION N/A 08/23/2017   Procedure: TRANSESOPHAGEAL ECHOCARDIOGRAM (TEE);  Surgeon: Rexene Alberts, MD;  Location: [place];  Service: Open Heart  Surgery;  Laterality: N/A;  . THORACIC AORTIC ANEURYSM REPAIR N/A 08/23/2017   Procedure: THORACIC ASCENDING ANEURYSM REPAIR (AAA) USING HEMASHIELD GOLD KNITTED MICROVEL DOUBLE VELOUR VASCULAR GRAFT D: 24 MM  L: 30 CM;  Surgeon: Rexene Alberts, MD;  Location: [place];  Service: Open Heart Surgery;  Laterality: N/A;       Home Medications    Prior to Admission medications   Medication Sig Start Date End Date Taking? Authorizing Provider  acetaminophen (TYLENOL) 325 MG tablet Take 650 mg by mouth every 6 (six) hours as needed for mild pain.   Yes [provider]  albuterol (VENTOLIN HFA) 108 (90 Base) MCG/ACT inhaler Inhale 2 puffs into the lungs every 6 (six) hours as needed for wheezing or shortness of breath. 02/03/19  Yes [name], PA-C  allopurinol (ZYLOPRIM) 300 MG tablet TAKE 1 Tablet BY MOUTH ONCE DAILY 10/31/18  Yes [name], PA-C  busPIRone (BUSPAR) 10 MG tablet Take 10 mg by mouth 3 (three) times daily.  12/09/18  Yes [provider]  ciprofloxacin (CIPRO) 500 MG tablet Take 1 tablet (500 mg total) by mouth every 12 (twelve) hours for 7 days. 03/07/19 03/14/19 Yes Wurst, [place], PA-C  citalopram (CELEXA) 20 MG tablet Take 40 mg by mouth daily.   Yes [provider]  docusate sodium (COLACE) 100 MG capsule Take 200 mg by mouth daily as needed for mild constipation.    Yes [provider]  DULERA 200-5 MCG/ACT AERO INHALE 2 PUFFS BY MOUTH TWICE DAILY. RINSE MOUTH AFTER EACH USE 01/24/19  Yes [name], PA-C  furosemide (LASIX) 80 MG tablet Take 1 tablet (80 mg total) by mouth 2 (two) times daily. Patient taking differently: Take 40 mg by mouth 2 (two) times daily. 2 tabs (80mg ) bid 01/02/19  Yes Strader, [place], PA-C  Krill Oil 350 MG CAPS Take 1 capsule by mouth daily.   Yes [provider]  lisinopril (ZESTRIL) 20 MG tablet TAKE 1 Tablet BY MOUTH ONCE DAILY 10/31/18  Yes [name], PA-C  loratadine (CLARITIN) 10  MG tablet Take 10 mg by mouth daily as needed for allergies.   Yes [provider]  metolazone (ZAROXOLYN) 5 MG tablet Take 1 tablet (5 mg total) by mouth every Monday, Wednesday, and Friday. 02/14/19 05/15/19 Yes Strader, [place] Hertz, PA-C  metoprolol tartrate (LOPRESSOR) 100 MG tablet TAKE 1 Tablet  BY MOUTH TWICE DAILY 01/24/19  Yes [name], PA-C  potassium chloride (K-DUR) 10 MEQ tablet Take 1 tablet (10 mEq total) by mouth daily. Take 3 Tablets on Monday, Wednesday, Friday 02/14/19 05/15/19 Yes Strader, [place] M, PA-C  XARELTO 20 MG TABS tablet TAKE 1 Tablet BY MOUTH ONCE DAILY WITH SUPPER 01/24/19  Yes [name], PA-C  albuterol (PROVENTIL) (2.5 MG/3ML) 0.083% nebulizer solution INHALE 1 VIAL VIA NEBULIZER EVERY 6 HOURS AS NEEDED FOR FOR WHEEZING OR SHORTNESS OF BREATH Patient taking  differently: Take 2.5 mg by nebulization every 6 (six) hours as needed for wheezing or shortness of breath.  08/08/18   [name], PA-C  ibuprofen (ADVIL) 800 MG tablet Take 800 mg by mouth every 8 (eight) hours as needed (back pain).    [provider]  metolazone (ZAROXOLYN) 5 MG tablet Take by mouth.    [provider]    Family History Family History  Problem Relation Age of Onset  . Hypertension Mother   . Alzheimer's disease Mother   . Heart attack Brother   . Diabetes Brother     Social History Social History   Tobacco Use  . Smoking status: Former Smoker    Packs/day: 0.50    Types: Cigarettes    Quit date: 08/23/2017    Years since quitting: 1.5  . Smokeless tobacco: Never Used  Substance Use Topics  . Alcohol use: No    Frequency: Never    Comment: have had alcohol in the past, not heavy  . Drug use: No     Allergies   Patient has no known allergies.   Review of Systems Review of Systems  Constitutional: Positive for fatigue. Negative for fever.  HENT: Negative for sore throat.   Respiratory: Negative for shortness of breath.    Cardiovascular: Negative for chest pain.  Gastrointestinal: Positive for nausea. Negative for abdominal pain and vomiting.  Genitourinary: Positive for frequency and urgency. Negative for difficulty urinating, discharge, dysuria, penile pain, penile swelling, scrotal swelling and testicular pain.  Skin: Negative for rash.  Neurological: Positive for dizziness and light-headedness. Negative for headaches.     Physical Exam Triage Vital Signs ED Triage Vitals  Enc Vitals Group     BP 03/08/19 1047 (!) 100/56     Pulse Rate 03/08/19 1047 71     Resp 03/08/19 1047 (!) 24     Temp 03/08/19 1053 97.7 F (36.5 C)     Temp Source 03/08/19 1053 Other     SpO2 03/08/19 1047 98 %     Weight --      Height --      Head Circumference --      Peak Flow --      Pain Score 03/08/19 1048 5     Pain Loc --      Pain Edu? --      Excl. in [place]? --    No data found.  Updated Vital Signs BP (!) 100/56   Pulse 71   Temp 97.7 F (36.5 C) (Other (Comment))   Resp (!) 24   SpO2 98%   Visual Acuity Right Eye Distance:   Left Eye Distance:   Bilateral Distance:    Right Eye Near:   Left Eye Near:    Bilateral Near:     Physical Exam Vitals signs and nursing note reviewed.  Constitutional:      Appearance: He is well-developed. He is obese.  HENT:     Head: Normocephalic and atraumatic.     Mouth/Throat:     Comments: Recurrent mouth with red blood appearing lesions, soft palate appears slightly erythematous, no tonsillar swelling, posterior pharynx patent, uvula midline without swelling Eyes:     Conjunctiva/sclera: Conjunctivae normal.  Neck:     Musculoskeletal: Neck supple.  Cardiovascular:     Rate and Rhythm: Normal rate and regular rhythm.     Heart sounds: Murmur present.     Comments: Harsh systolic murmur heard Pulmonary:     Effort: Pulmonary  effort is normal. No respiratory distress.     Breath sounds: Normal breath sounds.     Comments: Patient slightly tachypneic  when moving around, breathing comfortably at rest when seated  Breathing comfortably at rest, CTABL, no wheezing, rales or other adventitious sounds auscultated  Abdominal:     Palpations: Abdomen is soft.     Tenderness: There is no abdominal tenderness.  Skin:    General: Skin is warm and dry.  Neurological:     Mental Status: He is alert.      UC Treatments / Results  Labs (all labs ordered are listed, but only abnormal results are displayed) Labs Reviewed  POCT URINALYSIS DIP (DEVICE) - Abnormal; Notable for the following components:      Result Value   Urobilinogen, UA 2.0 (*)    Leukocytes,Ua TRACE (*)    All other components within normal limits  CULTURE, GROUP A STREP [facility])  POCT RAPID STREP A    EKG   Radiology No results found.  Procedures Procedures (including critical care time)  Medications Ordered in UC Medications - No data to display  Initial Impression / Assessment and Plan / UC Course  I have reviewed the triage vital signs and the nursing notes.  Pertinent labs & imaging results that were available during my care of the patient were reviewed by me and considered in my medical decision making (see chart for details).  Strep test negative, obtained given appearance of blood on soft palate.    Blood pressure rechecked and was 98/54.  Review of note from yesterday is consistent with pyelonephritis.  Given blood pressure 10 trending downward feel patient warrants blood work, possible fluids.  Given history of heart failure and previous valve replacement, recommending further evaluation in emergency room to ensure fluid safe to give as well as further blood work to rule out sepsis.  Patient verbalized understanding and agreement to go to emergency room.  Discussed methods of transportation to ED.  Patient declined ambulance.  Opted for patient to drive himself through the parking lot to the ED and have them valet parked his car for him.   Final  Clinical Impressions(s) / UC Diagnoses   Final diagnoses:  Dizziness  Pyelonephritis     Discharge Instructions     Please go to the emergency room for further evaluation Please have them [place] park your car    ED Prescriptions    None     PDMP not reviewed this encounter.   Janith Lima, PA-C 03/08/19 1150    Wieters, [place] C, PA-C 03/08/19 1152

## 2019-03-08 NOTE — Discharge Instructions (Addendum)
Please go to the emergency room for further evaluation Please have them [place] park your car

## 2019-03-08 NOTE — ED Triage Notes (Signed)
Pt states he came to UC to "get a shot" for his uti and they stated his bp was 90/50 and he needed to come here.  Started antibiotics yesterday.  BP in ed is 115/65 (map 78).  States his sob is per his norm whenever he ambulates, however, he feels more exhausted than normal.

## 2019-03-08 NOTE — Progress Notes (Signed)
COVID test negative. Will remove airborne precautions. No need for repeat testing.   Ina Homes, MD IMTS PGY3

## 2019-03-08 NOTE — ED Notes (Signed)
Pt anxious and sitting at bedside.  Light tremors noted with SOB.  States his Sob is normal for him and he uses nebulizers at home.  Denies any current needs at this time, does question his covid screen results.  Lab called to verify time frames.

## 2019-03-08 NOTE — ED Notes (Signed)
Report called to [place]

## 2019-03-08 NOTE — ED Notes (Signed)
Pt using urinal.

## 2019-03-08 NOTE — ED Triage Notes (Signed)
Pt was seen at [facility] yesterday; was told to have f/u @ [place] today.  States maybe is feeling a little better; but continues with dizziness and back pain.  Urine is clearing some per pt.  Pt has dyspnea - states no more than is usual for him.  Last night started with palate pain and bleeding.  Unsure if continuing with fevers.

## 2019-03-08 NOTE — ED Notes (Signed)
Patient is being discharged from the Urgent [facility] [place] and sent to the Emergency Department. Per H. Wieters, PA, patient is stable but in need of higher level of care due to multiple chronic health problems as well as acute illness of pyelonephritis and possibly Covid (test pending). Patient is aware and verbalizes understanding of plan of care.  Vitals:   03/08/19 1047 03/08/19 1053  BP: (!) 100/56   Pulse: 71   Resp: (!) 24   Temp:  97.7 F (36.5 C)  SpO2: 98%

## 2019-03-09 DIAGNOSIS — E876 Hypokalemia: Secondary | ICD-10-CM

## 2019-03-09 DIAGNOSIS — N179 Acute kidney failure, unspecified: Secondary | ICD-10-CM

## 2019-03-09 DIAGNOSIS — E871 Hypo-osmolality and hyponatremia: Secondary | ICD-10-CM

## 2019-03-09 DIAGNOSIS — N12 Tubulo-interstitial nephritis, not specified as acute or chronic: Secondary | ICD-10-CM

## 2019-03-09 DIAGNOSIS — J45909 Unspecified asthma, uncomplicated: Secondary | ICD-10-CM

## 2019-03-09 DIAGNOSIS — Z9989 Dependence on other enabling machines and devices: Secondary | ICD-10-CM

## 2019-03-09 LAB — URINE CULTURE
Culture: >=100000 — AB
Culture: NO GROWTH

## 2019-03-09 LAB — CBC
HCT: 35.6 % — ABNORMAL LOW (ref 39.0–52.0)
Hemoglobin: 11.6 g/dL — ABNORMAL LOW (ref 13.0–17.0)
MCH: 26.4 pg (ref 26.0–34.0)
MCHC: 32.6 g/dL (ref 30.0–36.0)
MCV: 81.1 fL (ref 80.0–100.0)
Platelets: 267 10*3/uL (ref 150–400)
RBC: 4.39 MIL/uL (ref 4.22–5.81)
RDW: 17.3 % — ABNORMAL HIGH (ref 11.5–15.5)
WBC: 12.3 10*3/uL — ABNORMAL HIGH (ref 4.0–10.5)
nRBC: 0 % (ref 0.0–0.2)

## 2019-03-09 LAB — COMPREHENSIVE METABOLIC PANEL
ALT: 26 U/L (ref 0–44)
AST: 36 U/L (ref 15–41)
Albumin: 2.8 g/dL — ABNORMAL LOW (ref 3.5–5.0)
Alkaline Phosphatase: 68 U/L (ref 38–126)
Anion gap: 16 — ABNORMAL HIGH (ref 5–15)
BUN: 25 mg/dL — ABNORMAL HIGH (ref 6–20)
CO2: 24 mmol/L (ref 22–32)
Calcium: 7.1 mg/dL — ABNORMAL LOW (ref 8.9–10.3)
Chloride: 89 mmol/L — ABNORMAL LOW (ref 98–111)
Creatinine, Ser: 1.27 mg/dL — ABNORMAL HIGH (ref 0.61–1.24)
GFR calc Af Amer: >60 mL/min (ref >60)
GFR calc non Af Amer: >60 mL/min (ref >60)
Glucose, Bld: 177 mg/dL — ABNORMAL HIGH (ref 70–99)
Potassium: 2.8 mmol/L — ABNORMAL LOW (ref 3.5–5.1)
Sodium: 129 mmol/L — ABNORMAL LOW (ref 135–145)
Total Bilirubin: 1.2 mg/dL (ref 0.3–1.2)
Total Protein: 7.3 g/dL (ref 6.5–8.1)

## 2019-03-09 LAB — BASIC METABOLIC PANEL
Anion gap: 16 — ABNORMAL HIGH (ref 5–15)
Anion gap: 15 (ref 5–15)
BUN: 22 mg/dL — ABNORMAL HIGH (ref 6–20)
BUN: 20 mg/dL (ref 6–20)
CO2: 24 mmol/L (ref 22–32)
CO2: 25 mmol/L (ref 22–32)
Calcium: 7.4 mg/dL — ABNORMAL LOW (ref 8.9–10.3)
Calcium: 7.9 mg/dL — ABNORMAL LOW (ref 8.9–10.3)
Chloride: 90 mmol/L — ABNORMAL LOW (ref 98–111)
Chloride: 91 mmol/L — ABNORMAL LOW (ref 98–111)
Creatinine, Ser: 1.19 mg/dL (ref 0.61–1.24)
Creatinine, Ser: 1.02 mg/dL (ref 0.61–1.24)
GFR calc Af Amer: >60 mL/min (ref >60)
GFR calc Af Amer: >60 mL/min (ref >60)
GFR calc non Af Amer: >60 mL/min (ref >60)
GFR calc non Af Amer: >60 mL/min (ref >60)
Glucose, Bld: 214 mg/dL — ABNORMAL HIGH (ref 70–99)
Glucose, Bld: 148 mg/dL — ABNORMAL HIGH (ref 70–99)
Potassium: 3 mmol/L — ABNORMAL LOW (ref 3.5–5.1)
Potassium: 3.9 mmol/L (ref 3.5–5.1)
Sodium: 130 mmol/L — ABNORMAL LOW (ref 135–145)
Sodium: 131 mmol/L — ABNORMAL LOW (ref 135–145)

## 2019-03-09 LAB — MAGNESIUM: Magnesium: 2.1 mg/dL (ref 1.7–2.4)

## 2019-03-09 LAB — GLUCOSE, CAPILLARY: Glucose-Capillary: 168 mg/dL — ABNORMAL HIGH (ref 70–99)

## 2019-03-09 LAB — SODIUM, URINE, RANDOM: Sodium, Ur: 29 mmol/L

## 2019-03-09 LAB — NOVEL CORONAVIRUS, NAA: SARS-CoV-2, NAA: NOT DETECTED

## 2019-03-09 MED ORDER — SODIUM CHLORIDE 3 % IV SOLN
INTRAVENOUS | Status: DC
  Filled 2019-03-09: qty 500

## 2019-03-09 MED ORDER — POTASSIUM CHLORIDE CRYS ER 20 MEQ PO TBCR
40.0000 meq | EXTENDED_RELEASE_TABLET | Freq: Once | ORAL | Status: AC
  Administered 2019-03-09: 01:00:00 40 meq via ORAL
  Filled 2019-03-09: qty 2

## 2019-03-09 MED ORDER — POTASSIUM CHLORIDE 10 MEQ/100ML IV SOLN: 10.0000 meq | INTRAVENOUS | Status: DC

## 2019-03-09 MED ORDER — MAGNESIUM SULFATE 2 GM/50ML IV SOLN
2.0000 g | Freq: Once | INTRAVENOUS | Status: AC
  Administered 2019-03-09: 09:00:00 2 g via INTRAVENOUS
  Filled 2019-03-09: qty 50

## 2019-03-09 MED ORDER — POTASSIUM CHLORIDE 10 MEQ/100ML IV SOLN
10.0000 meq | INTRAVENOUS | Status: AC
  Administered 2019-03-09 (×3): 10 meq via INTRAVENOUS

## 2019-03-09 MED ORDER — POTASSIUM CHLORIDE CRYS ER 20 MEQ PO TBCR
40.0000 meq | EXTENDED_RELEASE_TABLET | Freq: Two times a day (BID) | ORAL | Status: DC
  Administered 2019-03-09: 09:00:00 40 meq via ORAL
  Filled 2019-03-09: qty 2

## 2019-03-09 MED ORDER — POTASSIUM CHLORIDE CRYS ER 20 MEQ PO TBCR
40.0000 meq | EXTENDED_RELEASE_TABLET | Freq: Once | ORAL | Status: AC
  Administered 2019-03-09: 06:00:00 40 meq via ORAL
  Filled 2019-03-09: qty 2

## 2019-03-09 MED ORDER — POTASSIUM CHLORIDE 10 MEQ/100ML IV SOLN
10.0000 meq | INTRAVENOUS | Status: AC
  Administered 2019-03-09: 17:00:00 10 meq via INTRAVENOUS
  Filled 2019-03-09 (×2): qty 100

## 2019-03-09 MED ORDER — SODIUM CHLORIDE 0.9% FLUSH: 10.0000 mL | INTRAVENOUS | Status: DC | PRN

## 2019-03-09 MED ORDER — ZOLPIDEM TARTRATE 5 MG PO TABS
5.0000 mg | ORAL_TABLET | Freq: Every evening | ORAL | Status: DC | PRN
  Administered 2019-03-09: 22:00:00 5 mg via ORAL
  Filled 2019-03-09: qty 1

## 2019-03-09 NOTE — Progress Notes (Addendum)
   Subjective:  Mr. Reginald [name] was seen at bedside this morning. He states that he feels better than yesterday. We spoke about his PMH of UTI infections and his pyelonephritis. We also spoke about advancing his diet to heart healthy. All questions and concerns were addressed.   Objective:  Vital signs in last 24 hours: Vitals:   03/08/19 2212 03/08/19 2218 03/08/19 2300 03/09/19 0532  BP:  (!) 105/53  (!) 105/46  Pulse:  88 84 82  Resp:  16 17 17   Temp:  99.5 F (37.5 C)  98.5 F (36.9 C)  TempSrc:  Oral  Oral  SpO2:  98% 100% 98%  Weight: (!) 225.5 kg   (!) 225.5 kg  Height: 5\' 11"  (1.803 m)      Physical Exam Vitals signs and nursing note reviewed.  Constitutional:      General: He is not in acute distress.    Appearance: He is obese. He is not ill-appearing or toxic-appearing.     Comments: Morbidly obese male, sitting at bedside. Cooperative and appears in no acute distress.  HENT:     Head: Normocephalic and atraumatic.  Cardiovascular:     Rate and Rhythm: Normal rate and regular rhythm.     Heart sounds: Murmur present. No friction rub. No gallop.      Comments: Systolic murmur appreciated in all posts.  Abdominal:     General: Abdomen is flat. Bowel sounds are normal.     Tenderness: There is no abdominal tenderness. There is no guarding.  Musculoskeletal:     Right lower leg: No edema.     Left lower leg: No edema.     Comments: 1+ Pitting edema in the lower extremities bilaterally.   Skin:    General: Skin is warm.  Neurological:     Mental Status: He is alert and oriented to person, place, and time.     Assessment/Plan:  Active Problems:   Sepsis ([place])   Sepsis secondary to UTI ([place])  Sepsis Likely 2/2 to Pyelonephritis:  - Continue Ceftriaxone 1g IV - WBC: 12.3 <- 22.7 - Urine culture: Pending - Blood culture: Pending - Diet: Advanced to heart healthy.  - Continue monitoring Vitals - Continue following CBC  Asthma:  - Continue Dulera 200-5 MCG/ACT  - Continue Proventil 2.5 mg - Continue O2 therapy   Electrolyte Abnormalities:  - Hyponatremia 2/2 to sepsis  Na: 129 - Hypokalemia:   - K: 2.8  - Ordered Mg level  - KDUR 40 BID ordered OSA:  - CPAP PRN  Sleeping Aid:  Patient states that he had trouble sleeping las night, we will prescribe ambien.  - Ambien 5 mg Nightly  Aortic Valve Replacement:  - Continue Xarelto 20 mg  Depression:  - Continue Buspar 10 mg TID - Continue Celexa 40 mg QD  Dispo: Anticipated discharge pending medical course.  DIET: Heart healthy/carb modified   Maudie Mercury, MD 03/09/2019, 8:06 AM Pager: [phone]

## 2019-03-10 DIAGNOSIS — I5030 Unspecified diastolic (congestive) heart failure: Secondary | ICD-10-CM

## 2019-03-10 DIAGNOSIS — Z6841 Body Mass Index (BMI) 40.0 and over, adult: Secondary | ICD-10-CM

## 2019-03-10 LAB — BASIC METABOLIC PANEL
Anion gap: 13 (ref 5–15)
BUN: 18 mg/dL (ref 6–20)
CO2: 27 mmol/L (ref 22–32)
Calcium: 7.9 mg/dL — ABNORMAL LOW (ref 8.9–10.3)
Chloride: 94 mmol/L — ABNORMAL LOW (ref 98–111)
Creatinine, Ser: 1.06 mg/dL (ref 0.61–1.24)
GFR calc Af Amer: >60 mL/min (ref >60)
GFR calc non Af Amer: >60 mL/min (ref >60)
Glucose, Bld: 140 mg/dL — ABNORMAL HIGH (ref 70–99)
Potassium: 3.3 mmol/L — ABNORMAL LOW (ref 3.5–5.1)
Sodium: 134 mmol/L — ABNORMAL LOW (ref 135–145)

## 2019-03-10 LAB — CBC
HCT: 36.7 % — ABNORMAL LOW (ref 39.0–52.0)
Hemoglobin: 11.5 g/dL — ABNORMAL LOW (ref 13.0–17.0)
MCH: 25.9 pg — ABNORMAL LOW (ref 26.0–34.0)
MCHC: 31.3 g/dL (ref 30.0–36.0)
MCV: 82.7 fL (ref 80.0–100.0)
Platelets: 270 10*3/uL (ref 150–400)
RBC: 4.44 MIL/uL (ref 4.22–5.81)
RDW: 17.2 % — ABNORMAL HIGH (ref 11.5–15.5)
WBC: 9.4 10*3/uL (ref 4.0–10.5)
nRBC: 0 % (ref 0.0–0.2)

## 2019-03-10 LAB — HEMOGLOBIN A1C
Hgb A1c MFr Bld: 6.6 % — ABNORMAL HIGH (ref 4.8–5.6)
Mean Plasma Glucose: 143 mg/dL

## 2019-03-10 LAB — GLUCOSE, CAPILLARY: Glucose-Capillary: 132 mg/dL — ABNORMAL HIGH (ref 70–99)

## 2019-03-10 MED ORDER — METOPROLOL TARTRATE 100 MG PO TABS
100.0000 mg | ORAL_TABLET | Freq: Two times a day (BID) | ORAL | Status: DC
  Administered 2019-03-10: 13:00:00 100 mg via ORAL
  Filled 2019-03-10: qty 1

## 2019-03-10 MED ORDER — LISINOPRIL 10 MG PO TABS
20.0000 mg | ORAL_TABLET | Freq: Every day | ORAL | Status: DC
  Administered 2019-03-10: 20 mg via ORAL
  Filled 2019-03-10: qty 2

## 2019-03-10 MED ORDER — METOLAZONE 5 MG PO TABS: 5.0000 mg | ORAL_TABLET | ORAL | 6 refills | Status: DC

## 2019-03-10 MED ORDER — CIPROFLOXACIN HCL 500 MG PO TABS: 500.0000 mg | ORAL_TABLET | Freq: Two times a day (BID) | ORAL | 0 refills | Status: AC

## 2019-03-10 MED ORDER — FUROSEMIDE 80 MG PO TABS: 80.0000 mg | ORAL_TABLET | Freq: Two times a day (BID) | ORAL | 3 refills | Status: DC

## 2019-03-10 MED ORDER — POTASSIUM CHLORIDE 10 MEQ/100ML IV SOLN
10.0000 meq | INTRAVENOUS | Status: AC
  Administered 2019-03-10 (×3): 10 meq via INTRAVENOUS
  Filled 2019-03-10 (×3): qty 100

## 2019-03-10 NOTE — Progress Notes (Signed)
   Subjective: Pt seen during rounds this morning. Anxious to go home today to be with his dogs. States he is feeling well, denies fevers, chills, or abdominal or back pain.  Objective:  Vital signs in last 24 hours: Vitals:   03/09/19 2015 03/10/19 0100 03/10/19 0200 03/10/19 0500  BP: (!) 152/85   (!) 165/89  Pulse: 94 88 89 76  Resp: 16   20  Temp: 98.9 F (37.2 C)   98.4 F (36.9 C)  TempSrc: Oral   Oral  SpO2: 96%   97%  Weight:    (!) 225.3 kg  Height:       Physical Exam Vitals signs and nursing note reviewed.  Constitutional:      General: He is not in acute distress.    Appearance: He is obese.  Cardiovascular:     Rate and Rhythm: Normal rate and regular rhythm.     Heart sounds: Murmur present. Systolic murmur present.  Pulmonary:     Effort: Pulmonary effort is normal. No respiratory distress.     Breath sounds: Normal breath sounds. Decreased air movement present.  Abdominal:     Tenderness: There is no right CVA tenderness or left CVA tenderness.  Neurological:     Mental Status: He is alert.    Assessment/Plan:  Principal Problem:   Sepsis secondary to UTI [facility]) Active Problems:   Chronic heart failure with preserved ejection fraction (HFpEF) (HCC)   Pulmonary hypertension (HCC)   Paroxysmal atrial fibrillation (HCC)   Acute kidney injury ([place])   Sepsis Likely 2/2 to Pyelonephritis:  Urine culture on 10/16 remarkable for >100,000 colonies of E coli, sensitive to all antibiotics tested. - Afebrile in last 24hr. WBC: 9.4 < 12.3 < 22.7 - Urine culture on 10/17: no growth - Blood culture: no growth in 2 days - continue Ceftriaxone 1g IV (on day 3) Pt was given 7 day course of ciprofloxacin from urgent care on 10/16, states he has 11 pills left. He will need an additional 4 pills at discharge to be able to finish the course.    HTN: Pt's hypotension resolved today. Blood pressure hypertensive in 140-150s/80s. - will resume pt's home metoprolol and  lisinopril today - he can resume MWF metolazone and lasix at home upon discharge   Asthma:  - continue Dulera 200-5 MCG/ACT - continue Proventil 2.5 mg - continue O2 therapy   Electrolyte Abnormalities:  - Hyponatremia - improved - Hypokalemia - K 3.3 this morning, repleted with 39mEq IV x3  OSA:  - CPAP PRN  Sleeping Aid:  - ambien 5 mg Nightly  Aortic Valve Replacement:  - continue Xarelto 20 mg  Depression:  - continue Buspar 10 mg TID - continue Celexa 40 mg QD    Diet - heart healthy/carb modified  Fluids - none DVT ppx - on xarelto CODE STATUS - FULL CODE    Dispo: Anticipated discharge today, pending pt's blood pressure tolerates restarting home anti-hypertensives  Ladona Horns, MD 03/10/2019, 6:25 AM Pager: [phone]

## 2019-03-10 NOTE — Discharge Summary (Signed)
Name: Reginald [name] MRN: [id number] DOB: 12/27/1970 48 y.o. PCP: [name], PA-C  Date of Admission: 03/08/2019 12:12 PM Date of Discharge: 03/10/2019 Attending Physician: Dr. Aldine Contes  Discharge Diagnosis: 1. Sepsis secondary to pyelonephritis  Discharge Medications: Allergies as of 03/10/2019   No Known Allergies     Medication List    TAKE these medications   acetaminophen 325 MG tablet Commonly known as: TYLENOL Take 650 mg by mouth every 6 (six) hours as needed for mild pain.   albuterol (2.5 MG/3ML) 0.083% nebulizer solution Commonly known as: PROVENTIL INHALE 1 VIAL VIA NEBULIZER EVERY 6 HOURS AS NEEDED FOR FOR WHEEZING OR SHORTNESS OF BREATH What changed: See the new instructions.   albuterol 108 (90 Base) MCG/ACT inhaler Commonly known as: VENTOLIN HFA Inhale 2 puffs into the lungs every 6 (six) hours as needed for wheezing or shortness of breath. What changed: Another medication with the same name was changed. Make sure you understand how and when to take each.   allopurinol 300 MG tablet Commonly known as: ZYLOPRIM TAKE 1 Tablet BY MOUTH ONCE DAILY   busPIRone 10 MG tablet Commonly known as: BUSPAR Take 10 mg by mouth 3 (three) times daily.   ciprofloxacin 500 MG tablet Commonly known as: CIPRO Take 1 tablet (500 mg total) by mouth every 12 (twelve) hours for 7 days.   citalopram 20 MG tablet Commonly known as: CELEXA Take 40 mg by mouth daily.   docusate sodium 100 MG capsule Commonly known as: COLACE Take 200 mg by mouth daily as needed for mild constipation.   Dulera 200-5 MCG/ACT Aero Generic drug: mometasone-formoterol INHALE 2 PUFFS BY MOUTH TWICE DAILY. RINSE MOUTH AFTER EACH USE What changed: See the new instructions.   furosemide 80 MG tablet Commonly known as: Lasix Take 1 tablet (80 mg total) by mouth 2 (two) times daily. What changed:   how much to take  when to take this  additional instructions   Krill Oil  350 MG Caps Take 1 capsule by mouth daily.   lisinopril 20 MG tablet Commonly known as: ZESTRIL TAKE 1 Tablet BY MOUTH ONCE DAILY   loratadine 10 MG tablet Commonly known as: CLARITIN Take 10 mg by mouth daily as needed for allergies.   metolazone 5 MG tablet Commonly known as: ZAROXOLYN Take 1 tablet (5 mg total) by mouth every Monday, Wednesday, and Friday.   metoprolol tartrate 100 MG tablet Commonly known as: LOPRESSOR TAKE 1 Tablet  BY MOUTH TWICE DAILY   potassium chloride 10 MEQ tablet Commonly known as: KLOR-CON Take 1 tablet (10 mEq total) by mouth daily. Take 3 Tablets on Monday, Wednesday, Friday What changed:   how much to take  when to take this  additional instructions   Xarelto 20 MG Tabs tablet Generic drug: rivaroxaban TAKE 1 Tablet BY MOUTH ONCE DAILY WITH SUPPER What changed: See the new instructions.       Disposition and follow-up:   ReginaldSharron T [name] was discharged from [facility] in Stable condition.  At the hospital follow up visit please address:  1.  Mr. Reginald [name] was admitted for sepsis due to pyelonephritis. He had a urine culture 1 day prior to presentation which grew pansensitive E coli. His clinical condition improved with IV ceftriaxone and he was discharged with a 7 day course of ciprofloxacin.   His lasix, metolazone, lisinopril, and metoprolol where initially held in light of his hypotension on presentation. Pt tolerated lisinopril and metoprolol on  the day of discharge without any further hypotension. He was instructed to restart all his home medications at discharge. Will need follow-up to ensure no worsening of his lower extremity edema or any additional signs of decompensated heart failure.   2.  Labs / imaging needed at time of follow-up: Follow-up blood cultures (will be final on 03/13/2019)  3.  Pending labs/ test needing follow-up: NONE  Follow-up Appointments: Follow-up Information    [name], PA-C.  Schedule an appointment as soon as possible for a visit in 1 week(s).   Specialty: Physician Assistant Contact information: [address] [place] [address] [phone]        Herminio Commons, MD .   Specialty: Cardiology Contact information: [place] [address] [place] [facility] Course by problem list: 1. Sepsis secondary to pyelonephritis Mr. Frerking is a 48 year old M with significant PMH of severe obesity, HFpEF, bioprosthetic aortic valve replacement, and OSA admitted for sepsis thought to be secondary to pyelonephritis. He presented with a fever, hypotension, tachypnea, leukocytosis with left shift, elevated lactic acid, and urinalysis significant for moderate pyuria and small bacteria. Creatinine was 1.5 on admission, with prior baseline of 1.09-1.19. He was previously being treated for a UTI that evolved in pyelonephritis. His urine culture from 10/16 grew pansensitive E. Coli. Pt had taken 3 doses of ciprofloxacin at home before his admission. He was started on IV ceftriaxone. CT abdomen was without any signs of pyelonephritis. Blood cultures without growth in 4 days. Discharged with 7 day course of ciprofloxacin.   2. CHF with preserved ejection fraction Pt had 1+ pitting edema on initial exam with a BNP of 189. In light of his low pressures, his lasix, metolazone, lisinopril, and metoprolol were initially held. After pt had some clinical improvement, his blood pressure medications metoprolol and lisinopril were restarted. Pt was instructed to restart lasix and metolazone at home after discharge.   Discharge Vitals:   BP 137/68   Pulse 81   Temp 98.2 F (36.8 C) (Oral)   Resp (!) 21   Ht 5\' 11"  (1.803 m)   Wt (!) 225.3 kg   SpO2 95%   BMI 69.28 kg/m   Pertinent Labs, Studies, and Procedures:  CBC Latest Ref Rng & Units 03/10/2019 03/09/2019 03/08/2019  WBC 4.0 - 10.5 K/uL 9.4 12.3(H) 22.7(H)  Hemoglobin 13.0 - 17.0 g/dL  11.5(L) 11.6(L) 10.6(L)  Hematocrit 39.0 - 52.0 % 36.7(L) 35.6(L) 32.2(L)  Platelets 150 - 400 K/uL 270 267 310   BMP Latest Ref Rng & Units 03/10/2019 03/09/2019 03/09/2019  Glucose 70 - 99 mg/dL 140(H) 148(H) 214(H)  BUN 6 - 20 mg/dL 18 20 22(H)  Creatinine 0.61 - 1.24 mg/dL 1.06 1.02 1.19  BUN/Creat Ratio 6 - 22 (calc) - - -  Sodium 135 - 145 mmol/L 134(L) 131(L) 130(L)  Potassium 3.5 - 5.1 mmol/L 3.3(L) 3.9 3.0(L)  Chloride 98 - 111 mmol/L 94(L) 91(L) 90(L)  CO2 22 - 32 mmol/L 27 25 24   Calcium 8.9 - 10.3 mg/dL 7.9(L) 7.9(L) 7.4(L)   Recent Results (from the past 240 hour(s))  Novel Coronavirus, NAA ([facility])     Status: None   Collection Time: 03/07/19 11:42 AM   Specimen: Oropharyngeal swab; Nasopharyngeal(NP) swabs in vial transport medium   NASOPHARYNGE  Result Value Ref Range Status   SARS-CoV-2, NAA Not Detected Not Detected Final    Comment: This nucleic acid amplification test  was developed and its performance characteristics determined by [employer]. Nucleic acid amplification tests include PCR and TMA. This test has not been FDA cleared or approved. This test has been authorized by FDA under an Emergency Use Authorization (EUA). This test is only authorized for the duration of time the declaration that circumstances exist justifying the authorization of the emergency use of in vitro diagnostic tests for detection of SARS-CoV-2 virus and/or diagnosis of COVID-19 infection under section 564(b)(1) of the Act, 21 U.S.C. [id number]) (1), unless the authorization is terminated or revoked sooner. When diagnostic testing is negative, the possibility of a false negative result should be considered in the context of a patient's recent exposures and the presence of clinical signs and symptoms consistent with COVID-19. An individual without symptoms of COVID-19 and who is not shedding SARS-CoV-2 virus would  expect to have a negative (not detected) result in this  assay.   Urine Culture     Status: Abnormal   Collection Time: 03/07/19 12:07 PM   Specimen: Urine, Random  Result Value Ref Range Status   Specimen Description   Final    URINE, RANDOM Performed at [facility] [address], [place], [place] [address]    Special Requests   Final    NONE Performed at [facility], [address], [place], [place] [address]    Culture >=100,000 COLONIES/mL ESCHERICHIA COLI (A)  Final   Report Status 03/09/2019 FINAL  Final   Organism ID, Bacteria ESCHERICHIA COLI (A)  Final      Susceptibility   Escherichia coli - MIC*    AMPICILLIN <=2 SENSITIVE Sensitive     CEFAZOLIN <=4 SENSITIVE Sensitive     CEFTRIAXONE <=1 SENSITIVE Sensitive     CIPROFLOXACIN <=0.25 SENSITIVE Sensitive     GENTAMICIN <=1 SENSITIVE Sensitive     IMIPENEM <=0.25 SENSITIVE Sensitive     NITROFURANTOIN <=16 SENSITIVE Sensitive     TRIMETH/SULFA <=20 SENSITIVE Sensitive     AMPICILLIN/SULBACTAM <=2 SENSITIVE Sensitive     PIP/TAZO <=4 SENSITIVE Sensitive     Extended ESBL NEGATIVE Sensitive     * >=100,000 COLONIES/mL ESCHERICHIA COLI  Culture, group A strep (throat)     Status: None   Collection Time: 03/08/19 11:25 AM   Specimen: Throat  Result Value Ref Range Status   Specimen Description THROAT  Final   Special Requests NONE  Final   Culture   Final    NO GROUP A STREP (S.PYOGENES) ISOLATED Performed at [facility] [address] [place], [place] [address]    Report Status 03/11/2019 FINAL  Final  Blood culture (routine x 2)     Status: None (Preliminary result)   Collection Time: 03/08/19  1:10 PM   Specimen: BLOOD  Result Value Ref Range Status   Specimen Description BLOOD LEFT ANTECUBITAL  Final   Special Requests   Final    BOTTLES DRAWN AEROBIC AND ANAEROBIC Blood Culture adequate volume   Culture   Final    NO GROWTH 4 DAYS Performed at [facility], [place] [address], [place], [place] [address]    Report Status PENDING  Incomplete   Urine culture     Status: None   Collection Time: 03/08/19  1:10 PM   Specimen: Urine, Random  Result Value Ref Range Status   Specimen Description URINE, RANDOM  Final   Special Requests NONE  Final   Culture   Final    NO GROWTH Performed at  [facility], [place] [address], [place], [place] [address]    Report Status 03/09/2019 FINAL  Final  Blood culture (routine x 2)     Status: None (Preliminary result)   Collection Time: 03/08/19  1:45 PM   Specimen: BLOOD LEFT HAND  Result Value Ref Range Status   Specimen Description BLOOD LEFT HAND  Final   Special Requests   Final    BOTTLES DRAWN AEROBIC AND ANAEROBIC Blood Culture adequate volume   Culture   Final    NO GROWTH 4 DAYS Performed at [facility], [place] [address], [place], [place] [address]    Report Status PENDING  Incomplete  SARS CORONAVIRUS 2 (TAT 6-24 HRS) Nasopharyngeal Nasopharyngeal Swab     Status: None   Collection Time: 03/08/19  2:30 PM   Specimen: Nasopharyngeal Swab  Result Value Ref Range Status   SARS Coronavirus 2 NEGATIVE NEGATIVE Final    Comment: (NOTE) SARS-CoV-2 target nucleic acids are NOT DETECTED. The SARS-CoV-2 RNA is generally detectable in upper and lower respiratory specimens during the acute phase of infection. Negative results do not preclude SARS-CoV-2 infection, do not rule out co-infections with other pathogens, and should not be used as the sole basis for treatment or other patient management decisions. Negative results must be combined with clinical observations, patient history, and epidemiological information. The expected result is Negative. Fact Sheet for Patients: SugarRoll.be Fact Sheet for Healthcare Providers: https://www.woods-mathews.com/ This test is not yet approved or cleared by the [place] FDA and  has been authorized for detection and/or diagnosis of SARS-CoV-2 by FDA under an Emergency Use Authorization (EUA).  This EUA will remain  in effect (meaning this test can be used) for the duration of the COVID-19 declaration under Section 56 4(b)(1) of the Act, 21 U.S.C. section 360bbb-3(b)(1), unless the authorization is terminated or revoked sooner. Performed at [facility], [place] [address] [place], [place] [address]     EKG Interpretation  Date/Time:  Saturday March 08 2019 12:27:24 EDT Ventricular Rate:  72 PR Interval:    QRS Duration: 164 QT Interval:  478 QTC Calculation: 524 R Axis:   113 Text Interpretation:  Sinus rhythm Right bundle branch block Anteroseptal infarct, age indeterminate No significant changes from April 2020 ecg, No STEMI  Confirmed by Octaviano Glow [id number]) on 03/08/2019 12:37:05 PM      Chest X-ray 03/08/2019 CLINICAL DATA:  Infection workup. Palate pain and bleeding. Dyspnea.  EXAM: CHEST  1 VIEW  COMPARISON:  May 06, 2018  FINDINGS: Stable cardiomegaly. Stable pacemaker. No pneumothorax. The lungs are clear. No other acute abnormalities.  IMPRESSION: No acute abnormality.  CT Abdomen/Pelvis 03/08/2019 CLINICAL DATA:  48 year old male with history of bilateral CVAT.  EXAM: CT ABDOMEN AND PELVIS WITH CONTRAST  TECHNIQUE: Multidetector CT imaging of the abdomen and pelvis was performed using the standard protocol following bolus administration of intravenous contrast.  CONTRAST:  168mL OMNIPAQUE IOHEXOL 300 MG/ML  SOLN  COMPARISON:  No priors.  FINDINGS: Lower chest: Pacemaker leads in the right atrium and right ventricle.  Hepatobiliary: Severe diffuse low attenuation throughout the hepatic parenchyma, indicative of severe hepatic steatosis. No discrete cystic or solid hepatic lesions. No intra or extrahepatic biliary ductal dilatation. Gallbladder is normal in appearance.  Pancreas: No pancreatic mass. No pancreatic ductal dilatation. No pancreatic or peripancreatic fluid collections or inflammatory changes.   Spleen: Spleen is enlarged measuring 19.2 x 7.3 x 11.9 cm (estimated splenic volume of 834 mL).  Adrenals/Urinary Tract: Bilateral kidneys and right adrenal gland are normal in appearance. 1.8 x 1.2 cm fatty attenuation lesion in the left adrenal gland (axial image 36 of series 3), compatible with a benign myelolipoma. No hydroureteronephrosis. Urinary bladder is normal in appearance.  Stomach/Bowel: Normal appearance of the stomach. No pathologic dilatation of small bowel or colon. Normal appendix.  Vascular/Lymphatic: Aortic atherosclerosis, without evidence of aneurysm or dissection in the abdominal or pelvic vasculature. No lymphadenopathy noted in the abdomen or pelvis.  Reproductive: Prostate gland and seminal vesicles are unremarkable in appearance.  Other: No significant volume of ascites.  No pneumoperitoneum.  Musculoskeletal: There are no aggressive appearing lytic or blastic lesions noted in the visualized portions of the skeleton.  IMPRESSION: 1. No acute findings are noted in the abdomen or pelvis to account for the patient's symptoms. 2. Severe hepatic steatosis. 3. Splenomegaly. 4. Small left adrenal myelolipoma. 5. Aortic atherosclerosis.   Discharge Instructions: Discharge Instructions    Diet - low sodium heart healthy   Complete by: As directed    Discharge instructions   Complete by: As directed    Thank you for allowing [place] to provide your care at [facility]. We are glad that you feel better. Please take Ciprofloxacin every 12 hours for 7 more days. (I prescribe you 4 more tablets since you have 11 more tablets at home). Please take rest of your medications as before (pay attention to start date: we gave your antibiotic and blood pressure medications today, so start your home medications tomorrow) Today we made  changes to your medications.    Please follow-up in with your PCP in a week.  Should you have any questions or concerns  please call the internal medicine clinic at [phone].   Increase activity slowly   Complete by: As directed       Signed: Ladona Horns, MD 03/12/2019, 3:33 PM   Pager: [phone]

## 2019-03-10 NOTE — Progress Notes (Signed)
Patient given discharge instructions medication list and prescritpons sent to personal pharmacy. Patient verbalized understanding of follow up appointments. IV and tele were dcd. Will discharge home as ordered. Transported to exit via wheel chair and nursing staff. Nelda Bucks, Bettina Gavia RN

## 2019-03-11 LAB — CULTURE, GROUP A STREP (THRC)

## 2019-03-13 LAB — CULTURE, BLOOD (ROUTINE X 2)
Culture: NO GROWTH
Culture: NO GROWTH
Special Requests: ADEQUATE
Special Requests: ADEQUATE

## 2019-03-15 ENCOUNTER — Other Ambulatory Visit: Payer: Self-pay

## 2019-03-15 ENCOUNTER — Emergency Department (HOSPITAL_COMMUNITY): Payer: Self-pay

## 2019-03-15 ENCOUNTER — Inpatient Hospital Stay (HOSPITAL_COMMUNITY)
Admission: EM | Admit: 2019-03-15 | Discharge: 2019-03-17 | DRG: 683 | Disposition: A | Discharge status: Home / Self Care | Payer: Self-pay | Attending: Internal Medicine | Admitting: Internal Medicine

## 2019-03-15 ENCOUNTER — Encounter (HOSPITAL_COMMUNITY): Payer: Self-pay

## 2019-03-15 DIAGNOSIS — J449 Chronic obstructive pulmonary disease, unspecified: Secondary | ICD-10-CM | POA: Diagnosis present

## 2019-03-15 DIAGNOSIS — Z833 Family history of diabetes mellitus: Secondary | ICD-10-CM

## 2019-03-15 DIAGNOSIS — I4510 Unspecified right bundle-branch block: Secondary | ICD-10-CM | POA: Diagnosis present

## 2019-03-15 DIAGNOSIS — I272 Pulmonary hypertension, unspecified: Secondary | ICD-10-CM | POA: Diagnosis present

## 2019-03-15 DIAGNOSIS — K5900 Constipation, unspecified: Secondary | ICD-10-CM | POA: Diagnosis present

## 2019-03-15 DIAGNOSIS — I5032 Chronic diastolic (congestive) heart failure: Secondary | ICD-10-CM | POA: Diagnosis present

## 2019-03-15 DIAGNOSIS — I10 Essential (primary) hypertension: Secondary | ICD-10-CM | POA: Diagnosis present

## 2019-03-15 DIAGNOSIS — R7303 Prediabetes: Secondary | ICD-10-CM | POA: Diagnosis present

## 2019-03-15 DIAGNOSIS — Z23 Encounter for immunization: Secondary | ICD-10-CM

## 2019-03-15 DIAGNOSIS — M1990 Unspecified osteoarthritis, unspecified site: Secondary | ICD-10-CM | POA: Diagnosis present

## 2019-03-15 DIAGNOSIS — Z87891 Personal history of nicotine dependence: Secondary | ICD-10-CM

## 2019-03-15 DIAGNOSIS — G4733 Obstructive sleep apnea (adult) (pediatric): Secondary | ICD-10-CM | POA: Diagnosis present

## 2019-03-15 DIAGNOSIS — Z8249 Family history of ischemic heart disease and other diseases of the circulatory system: Secondary | ICD-10-CM

## 2019-03-15 DIAGNOSIS — I959 Hypotension, unspecified: Secondary | ICD-10-CM | POA: Diagnosis present

## 2019-03-15 DIAGNOSIS — E876 Hypokalemia: Secondary | ICD-10-CM | POA: Diagnosis present

## 2019-03-15 DIAGNOSIS — Z820 Family history of epilepsy and other diseases of the nervous system: Secondary | ICD-10-CM

## 2019-03-15 DIAGNOSIS — N179 Acute kidney failure, unspecified: Principal | ICD-10-CM | POA: Diagnosis present

## 2019-03-15 DIAGNOSIS — B9620 Unspecified Escherichia coli [E. coli] as the cause of diseases classified elsewhere: Secondary | ICD-10-CM | POA: Diagnosis present

## 2019-03-15 DIAGNOSIS — E860 Dehydration: Secondary | ICD-10-CM | POA: Diagnosis present

## 2019-03-15 DIAGNOSIS — Z953 Presence of xenogenic heart valve: Secondary | ICD-10-CM

## 2019-03-15 DIAGNOSIS — Z20828 Contact with and (suspected) exposure to other viral communicable diseases: Secondary | ICD-10-CM | POA: Diagnosis present

## 2019-03-15 DIAGNOSIS — Z6841 Body Mass Index (BMI) 40.0 and over, adult: Secondary | ICD-10-CM

## 2019-03-15 DIAGNOSIS — R945 Abnormal results of liver function studies: Secondary | ICD-10-CM

## 2019-03-15 DIAGNOSIS — Z7901 Long term (current) use of anticoagulants: Secondary | ICD-10-CM

## 2019-03-15 DIAGNOSIS — Z86711 Personal history of pulmonary embolism: Secondary | ICD-10-CM | POA: Diagnosis present

## 2019-03-15 DIAGNOSIS — Z79899 Other long term (current) drug therapy: Secondary | ICD-10-CM

## 2019-03-15 DIAGNOSIS — F419 Anxiety disorder, unspecified: Secondary | ICD-10-CM | POA: Diagnosis present

## 2019-03-15 DIAGNOSIS — Z950 Presence of cardiac pacemaker: Secondary | ICD-10-CM

## 2019-03-15 DIAGNOSIS — I110 Hypertensive heart disease with heart failure: Secondary | ICD-10-CM | POA: Diagnosis present

## 2019-03-15 DIAGNOSIS — I480 Paroxysmal atrial fibrillation: Secondary | ICD-10-CM | POA: Diagnosis present

## 2019-03-15 DIAGNOSIS — N390 Urinary tract infection, site not specified: Secondary | ICD-10-CM | POA: Diagnosis present

## 2019-03-15 DIAGNOSIS — F329 Major depressive disorder, single episode, unspecified: Secondary | ICD-10-CM | POA: Diagnosis present

## 2019-03-15 DIAGNOSIS — M109 Gout, unspecified: Secondary | ICD-10-CM | POA: Diagnosis present

## 2019-03-15 DIAGNOSIS — A419 Sepsis, unspecified organism: Secondary | ICD-10-CM | POA: Diagnosis present

## 2019-03-15 LAB — CBC WITH DIFFERENTIAL/PLATELET
Abs Immature Granulocytes: 0.34 10*3/uL — ABNORMAL HIGH (ref 0.00–0.07)
Basophils Absolute: 0.1 10*3/uL (ref 0.0–0.1)
Basophils Relative: 0 %
Eosinophils Absolute: 0.3 10*3/uL (ref 0.0–0.5)
Eosinophils Relative: 2 %
HCT: 40.6 % (ref 39.0–52.0)
Hemoglobin: 12.9 g/dL — ABNORMAL LOW (ref 13.0–17.0)
Immature Granulocytes: 2 %
Lymphocytes Relative: 15 %
Lymphs Abs: 3 10*3/uL (ref 0.7–4.0)
MCH: 26.3 pg (ref 26.0–34.0)
MCHC: 31.8 g/dL (ref 30.0–36.0)
MCV: 82.7 fL (ref 80.0–100.0)
Monocytes Absolute: 0.8 10*3/uL (ref 0.1–1.0)
Monocytes Relative: 4 %
Neutro Abs: 15.7 10*3/uL — ABNORMAL HIGH (ref 1.7–7.7)
Neutrophils Relative %: 77 %
Platelets: 366 10*3/uL (ref 150–400)
RBC: 4.91 MIL/uL (ref 4.22–5.81)
RDW: 17.7 % — ABNORMAL HIGH (ref 11.5–15.5)
WBC: 20.2 10*3/uL — ABNORMAL HIGH (ref 4.0–10.5)
nRBC: 0 % (ref 0.0–0.2)

## 2019-03-15 MED ORDER — SODIUM CHLORIDE 0.9 % IV BOLUS
500.0000 mL | Freq: Once | INTRAVENOUS | Status: AC
  Administered 2019-03-16: 500 mL via INTRAVENOUS

## 2019-03-15 NOTE — ED Triage Notes (Signed)
Pt reports checking his BP earlier this evening and getting reading of 94/"forty something"- called nurse hotline and was instructed to come to ED. Pt also reports having body cramps and decreased urine output. Pt took diuretic this morning but not tonight yet.

## 2019-03-15 NOTE — ED Provider Notes (Signed)
[facility] EMERGENCY DEPARTMENT Provider Note   CSN: [id number] Arrival date & time: 03/15/19  2047     History   Chief Complaint Chief Complaint  Patient presents with  . low bp    cramping     HPI Reginald [name] is a 48 y.o. male.      48 y.o. male with h/o AS s/p AVR 2019, paroxysmal atrial fibrillation on lopressor and xarelto, CHG s/p PPM, bilateral PE, HTN, OSA, morbid obesity, chronic diastolic HF EF [id number] presents to the ER with low blood pressure and "cramping all over".  [place] and began to feel cramping all over his body in his arms and legs.  When he came home he checked his blood pressure and found it to be 70 systolic and then 0000000 systolic again with a wrist cuff.  He is notably morbidly obese with large arms.  States he does take blood pressure medication but does not normally check his blood pressure at home.  He states the abdominal cramping has improved.  Denies any nausea or vomiting.  Did not have any fever.  He was recently admitted for pyelonephritis and continues to take antibiotics which he has about 5 doses left.  He states he has decreased urine output today and has not taken his diuretic this evening.  He denies any dizziness or lightheadedness currently.  No headache.  No chest pain or shortness of breath.  Some upper abdominal soreness but no vomiting or diarrhea.  No focal weakness, numbness or tingling.  No difficulty speaking or difficulty swallowing.  The history is provided by the patient.    Past Medical History:  Diagnosis Date  . Anxiety   . Aortic stenosis   . Arthritis   . Asthma   . Back pain   . Cellulitis of skin with lymphangitis   . CHF (congestive heart failure) ([place])   . Chronic diastolic congestive heart failure ([place])   . Chronic venous insufficiency   . Constipation   . COPD (chronic obstructive pulmonary disease) ([place])   . Depression   . Dyspnea   . Essential  hypertension   . Gout   . Heart murmur   . Hypertension   . Morbid obesity ([place])   . Obesity   . Pneumonia    walking pneumonia  . Pre-diabetes   . Prostatitis   . Pulmonary embolism ([place])   . Pulmonary hypertension ([place])   . Pyelonephritis   . S/P aortic valve replacement with bioprosthetic valve 08/23/2017   25 mm Edwards Inspiris Resilia stented bovine pericardial tissue valve  . 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 Sleep apnea    cpap   . Thoracic ascending aortic aneurysm ([place])   . Thoracic ascending aortic aneurysm ([place])   . Tobacco abuse     Patient Active Problem List   Diagnosis Date Noted  . Acute kidney injury ([place]) 03/09/2019  . Sepsis secondary to UTI ([place]) 03/08/2019  . Paroxysmal atrial fibrillation ([place]) 09/05/2018  . Chronic obstructive pulmonary disease ([place]) 05/07/2018  . Gastroesophageal reflux disease   . Pulmonary embolism ([place]) 09/18/2017  . Encounter for therapeutic drug monitoring 09/04/2017  . 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 08/23/2017  . 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 Pulmonary hypertension ([place])   . Chronic periodontitis 07/18/2017  . Essential  hypertension   . Tobacco abuse   . Chronic heart failure with preserved ejection fraction (HFpEF) ([place])   . Chronic venous insufficiency     Past Surgical History:  Procedure Laterality Date  . AORTIC VALVE REPLACEMENT N/A 08/23/2017   Procedure: AORTIC VALVE REPLACEMENT (AVR) USING INSPIRIS RESILIA AORTIC VALVE SIZE 25 MM;  Surgeon: Rexene Alberts, MD;  Location: [place];  Service: Open Heart Surgery;  Laterality: N/A;  . MULTIPLE EXTRACTIONS WITH ALVEOLOPLASTY N/A 07/23/2017   Procedure: Extraction of tooth #10 with alveoloplasty and gross debridement of remaining teeth;  Surgeon: Lenn Cal, DDS;  Location: [place];  Service: Oral Surgery;  Laterality: N/A;  .  NO PAST SURGERIES    . PACEMAKER IMPLANT N/A 08/28/2017    St Jude Medical Assurity MRI conditional  dual-chamber pacemaker for symptomatic complete heart blockby Dr Rayann Heman  . TEE WITHOUT CARDIOVERSION N/A 07/16/2017   Procedure: TRANSESOPHAGEAL ECHOCARDIOGRAM (TEE);  Surgeon: Lelon Perla, MD;  [facility] ENDOSCOPY;  Service: Cardiovascular;  Laterality: N/A;  . TEE WITHOUT CARDIOVERSION N/A 08/23/2017   Procedure: TRANSESOPHAGEAL ECHOCARDIOGRAM (TEE);  Surgeon: Rexene Alberts, MD;  Location: [place];  Service: Open Heart Surgery;  Laterality: N/A;  . THORACIC AORTIC ANEURYSM REPAIR N/A 08/23/2017   Procedure: THORACIC ASCENDING ANEURYSM REPAIR (AAA) USING HEMASHIELD GOLD KNITTED MICROVEL DOUBLE VELOUR VASCULAR GRAFT D: 24 MM  L: 30 CM;  Surgeon: Rexene Alberts, MD;  Location: [place];  Service: Open Heart Surgery;  Laterality: N/A;        Home Medications    Prior to Admission medications   Medication Sig Start Date End Date Taking? Authorizing Provider  acetaminophen (TYLENOL) 325 MG tablet Take 650 mg by mouth every 6 (six) hours as needed for mild pain.    [provider]  albuterol (PROVENTIL) (2.5 MG/3ML) 0.083% nebulizer solution INHALE 1 VIAL VIA NEBULIZER EVERY 6 HOURS AS NEEDED FOR FOR WHEEZING OR SHORTNESS OF BREATH Patient taking differently: Take 2.5 mg by nebulization every 6 (six) hours as needed for wheezing or shortness of breath.  08/08/18   [name], PA-C  albuterol (VENTOLIN HFA) 108 (90 Base) MCG/ACT inhaler Inhale 2 puffs into the lungs every 6 (six) hours as needed for wheezing or shortness of breath. 02/03/19   [name], PA-C  allopurinol (ZYLOPRIM) 300 MG tablet TAKE 1 Tablet BY MOUTH ONCE DAILY Patient taking differently: Take 300 mg by mouth daily.  10/31/18   [name], PA-C  busPIRone (BUSPAR) 10 MG tablet Take 10 mg by mouth 3 (three) times daily.  12/09/18   [provider]  ciprofloxacin (CIPRO) 500 MG tablet Take 1 tablet  (500 mg total) by mouth every 12 (twelve) hours for 7 days. 03/10/19 03/17/19  Masoudi, Dorthula Rue, MD  citalopram (CELEXA) 20 MG tablet Take 40 mg by mouth daily.    [provider]  docusate sodium (COLACE) 100 MG capsule Take 200 mg by mouth daily as needed for mild constipation.     [provider]  DULERA 200-5 MCG/ACT AERO INHALE 2 PUFFS BY MOUTH TWICE DAILY. RINSE MOUTH AFTER EACH USE Patient taking differently: Inhale 2 puffs into the lungs 2 (two) times daily.  01/24/19   [name], PA-C  furosemide (LASIX) 80 MG tablet Take 1 tablet (80 mg total) by mouth 2 (two) times daily. 03/11/19   Masoudi, Dorthula Rue, MD  Krill Oil 350 MG CAPS Take 1 capsule by mouth daily.    [provider]  lisinopril (ZESTRIL)  20 MG tablet TAKE 1 Tablet BY MOUTH ONCE DAILY Patient taking differently: Take 20 mg by mouth daily.  10/31/18   [name], PA-C  loratadine (CLARITIN) 10 MG tablet Take 10 mg by mouth daily as needed for allergies.    [provider]  metolazone (ZAROXOLYN) 5 MG tablet Take 1 tablet (5 mg total) by mouth every Monday, Wednesday, and Friday. 03/12/19 06/10/19  Masoudi, Dorthula Rue, MD  metoprolol tartrate (LOPRESSOR) 100 MG tablet TAKE 1 Tablet  BY MOUTH TWICE DAILY Patient taking differently: Take 100 mg by mouth 2 (two) times daily.  01/24/19   [name], PA-C  potassium chloride (K-DUR) 10 MEQ tablet Take 1 tablet (10 mEq total) by mouth daily. Take 3 Tablets on Monday, Wednesday, Friday Patient taking differently: Take 10-30 mEq by mouth See admin instructions. Take 3 Tablets (30 mEq totally) by mouth on Monday, Wednesday, Friday and 1 tablet (10 mEq totally) by mouth other days 02/14/19 05/15/19  Strader, Fransisco Hertz, PA-C  XARELTO 20 MG TABS tablet TAKE 1 Tablet BY MOUTH ONCE DAILY WITH SUPPER Patient taking differently: Take 20 mg by mouth daily with supper.  01/24/19   [name], PA-C    Family History Family History   Problem Relation Age of Onset  . Hypertension Mother   . Alzheimer's disease Mother   . Heart attack Brother   . Diabetes Brother     Social History Social History   Tobacco Use  . Smoking status: Former Smoker    Packs/day: 0.50    Types: Cigarettes    Quit date: 08/23/2017    Years since quitting: 1.5  . Smokeless tobacco: Never Used  Substance Use Topics  . Alcohol use: No    Frequency: Never    Comment: have had alcohol in the past, not heavy  . Drug use: No     Allergies   Patient has no known allergies.   Review of Systems Review of Systems  Constitutional: Positive for fatigue. Negative for fever.  Eyes: Negative for visual disturbance.  Respiratory: Negative for cough, chest tightness and shortness of breath.   Cardiovascular: Negative for chest pain.  Gastrointestinal: Positive for nausea. Negative for abdominal pain and vomiting.  Genitourinary: Positive for decreased urine volume, dysuria and frequency. Negative for hematuria.  Musculoskeletal: Positive for arthralgias and myalgias.  Neurological: Positive for dizziness, weakness and light-headedness.    all other systems are negative except as noted in the HPI and PMH.    Physical Exam Updated Vital Signs BP 137/60 (BP Location: Right Arm)   Pulse 95   Temp 98.9 F (37.2 C) (Oral)   Resp (!) 23   Ht 5\' 11"  (1.803 m)   Wt (!) 220.4 kg   SpO2 96%   BMI 67.78 kg/m   Physical Exam Vitals signs and nursing note reviewed.  Constitutional:      General: He is not in acute distress.    Appearance: He is well-developed. He is obese.     Comments: Morbidly obese, disheveled  HENT:     Head: Normocephalic and atraumatic.     Mouth/Throat:     Pharynx: No oropharyngeal exudate.  Eyes:     Conjunctiva/sclera: Conjunctivae normal.     Pupils: Pupils are equal, round, and reactive to light.  Neck:     Musculoskeletal: Normal range of motion and neck supple.     Comments: No meningismus.  Cardiovascular:     Rate and Rhythm: Normal rate and regular rhythm.  Heart sounds: Normal heart sounds. No murmur.  Pulmonary:     Effort: Pulmonary effort is normal. No respiratory distress.     Breath sounds: Normal breath sounds.  Abdominal:     Palpations: Abdomen is soft.     Tenderness: There is abdominal tenderness. There is no guarding or rebound.     Comments: Difficult exam due to obesity, mild tenderness in the upper quadrants, no guarding or rebound  Musculoskeletal: Normal range of motion.        General: No tenderness.  Skin:    General: Skin is warm.  Neurological:     General: No focal deficit present.     Mental Status: He is alert and oriented to person, place, and time. Mental status is at baseline.     Cranial Nerves: No cranial nerve deficit.     Motor: No abnormal muscle tone.     Coordination: Coordination normal.     Comments: No ataxia on finger to nose bilaterally. No pronator drift. 5/5 strength throughout. CN 2-12 intact.Equal grip strength. Sensation intact.   Psychiatric:        Behavior: Behavior normal.      ED Treatments / Results  Labs (all labs ordered are listed, but only abnormal results are displayed) Labs Reviewed  URINALYSIS, ROUTINE W REFLEX MICROSCOPIC - Abnormal; Notable for the following components:      Result Value   APPearance HAZY (*)    Protein, ur 30 (*)    All other components within normal limits  CBC WITH DIFFERENTIAL/PLATELET - Abnormal; Notable for the following components:   WBC 20.2 (*)    Hemoglobin 12.9 (*)    RDW 17.7 (*)    Neutro Abs 15.7 (*)    Abs Immature Granulocytes 0.34 (*)    All other components within normal limits  COMPREHENSIVE METABOLIC PANEL - Abnormal; Notable for the following components:   Sodium 133 (*)    Potassium 3.2 (*)    Chloride 91 (*)    Glucose, Bld 140 (*)    BUN 52 (*)    Creatinine, Ser 3.14 (*)    Calcium 8.6 (*)    AST 51 (*)    GFR calc non Af Amer 22 (*)    GFR calc  Af Amer 26 (*)    All other components within normal limits  LACTIC ACID, PLASMA - Abnormal; Notable for the following components:   Lactic Acid, Venous 2.6 (*)    All other components within normal limits  LACTIC ACID, PLASMA - Abnormal; Notable for the following components:   Lactic Acid, Venous 2.3 (*)    All other components within normal limits  URINE CULTURE  SARS CORONAVIRUS 2 (TAT 6-24 HRS)  CULTURE, BLOOD (ROUTINE X 2)  CULTURE, BLOOD (ROUTINE X 2)  CK  MAGNESIUM  BRAIN NATRIURETIC PEPTIDE  COMPREHENSIVE METABOLIC PANEL  CBC  CORTISOL  PROTIME-INR  TROPONIN I (HIGH SENSITIVITY)  TROPONIN I (HIGH SENSITIVITY)  TROPONIN I (HIGH SENSITIVITY)    EKG EKG Interpretation  Date/Time:  Saturday March 15 2019 23:12:21 EDT Ventricular Rate:  80 PR Interval:    QRS Duration: 158 QT Interval:  456 QTC Calculation: 527 R Axis:   121 Text Interpretation:  Sinus rhythm Right bundle branch block Baseline wander in lead(s) I aVR V1 V2 No significant change was found Confirmed by Ezequiel Essex [id number]) on 03/15/2019 11:38:18 PM   Radiology Dg Chest Portable 1 View  Result Date: 03/16/2019 CLINICAL DATA:  48 year old male with weakness.  EXAM: PORTABLE CHEST 1 VIEW COMPARISON:  Chest radiograph dated 03/08/2019 FINDINGS: There is no focal consolidation, pleural effusion, or pneumothorax. Stable cardiomegaly. Median sternotomy wires and left pectoral pacemaker device. No acute osseous pathology. IMPRESSION: 1. No acute cardiopulmonary process. 2. Stable cardiomegaly. Electronically Signed   By: Anner Crete M.D.   On: 03/16/2019 00:06    Procedures Procedures (including critical care time)  Medications Ordered in ED Medications - No data to display   Initial Impression / Assessment and Plan / ED Course  I have reviewed the triage vital signs and the nursing notes.  Pertinent labs & imaging results that were available during my care of the patient were reviewed by me  and considered in my medical decision making (see chart for details).       Patient with episode of lightheadedness and decreased blood pressure at home.  Blood pressure was normal on arrival.  Nonfocal neurological exam.  Did have body cramps earlier which is since resolved.  Hospitalization record reviewed.  Patient recently hospitalized with pyelonephritis.  Urine culture grew pansensitive E. coli.  Blood cultures remain negative.  Patient was sent home on Cipro.  BP is soft in the 90s in the ED but difficult to measure due to body habitus.  Lactate elevated 2.6.  Will give gentle hydration.  Creatinine elevated at 3.1 from normal baseline.  CT scan from earlier in October did not show any acute pathology.  Bladder scan today shows no urine.  Patient need admission for AKI.  His CK is normal.  Blood pressure remains soft in the ED in the 90s and is given additional IV hydration.  Labs show AKI and elevated lactic acid.  Urinalysis today is pending.  Afebrile.  No obvious infectious source at this time but did recently have pyelonephritis.  He will be dosed with IV cefepime while cultures are pending.  Admission discussed with Dr. Maudie Mercury  CRITICAL CARE Performed by: Ezequiel Essex Total critical care time: 35 minutes Critical care time was exclusive of separately billable procedures and treating other patients. Critical care was necessary to treat or prevent imminent or life-threatening deterioration. Critical care was time spent personally by me on the following activities: development of treatment plan with patient and/or surrogate as well as nursing, discussions with consultants, evaluation of patient's response to treatment, examination of patient, obtaining history from patient or surrogate, ordering and performing treatments and interventions, ordering and review of laboratory studies, ordering and review of radiographic studies, pulse oximetry and re-evaluation of patient's  condition.  Final Clinical Impressions(s) / ED Diagnoses   Final diagnoses:  AKI (acute kidney injury) [facility])    ED Discharge Orders    None       Ezequiel Essex, MD 03/16/19 [phone]

## 2019-03-16 ENCOUNTER — Encounter (HOSPITAL_COMMUNITY): Payer: Self-pay | Admitting: Internal Medicine

## 2019-03-16 ENCOUNTER — Inpatient Hospital Stay (HOSPITAL_COMMUNITY): Payer: Self-pay

## 2019-03-16 DIAGNOSIS — Z86711 Personal history of pulmonary embolism: Secondary | ICD-10-CM | POA: Diagnosis present

## 2019-03-16 DIAGNOSIS — N179 Acute kidney failure, unspecified: Secondary | ICD-10-CM | POA: Diagnosis present

## 2019-03-16 DIAGNOSIS — I480 Paroxysmal atrial fibrillation: Secondary | ICD-10-CM

## 2019-03-16 DIAGNOSIS — R945 Abnormal results of liver function studies: Secondary | ICD-10-CM | POA: Diagnosis present

## 2019-03-16 DIAGNOSIS — I959 Hypotension, unspecified: Secondary | ICD-10-CM

## 2019-03-16 LAB — COMPREHENSIVE METABOLIC PANEL
ALT: 42 U/L (ref 0–44)
ALT: 37 U/L (ref 0–44)
AST: 51 U/L — ABNORMAL HIGH (ref 15–41)
AST: 44 U/L — ABNORMAL HIGH (ref 15–41)
Albumin: 3.5 g/dL (ref 3.5–5.0)
Albumin: 3.1 g/dL — ABNORMAL LOW (ref 3.5–5.0)
Alkaline Phosphatase: 71 U/L (ref 38–126)
Alkaline Phosphatase: 66 U/L (ref 38–126)
Anion gap: 15 (ref 5–15)
Anion gap: 15 (ref 5–15)
BUN: 52 mg/dL — ABNORMAL HIGH (ref 6–20)
BUN: 54 mg/dL — ABNORMAL HIGH (ref 6–20)
CO2: 27 mmol/L (ref 22–32)
CO2: 26 mmol/L (ref 22–32)
Calcium: 8.6 mg/dL — ABNORMAL LOW (ref 8.9–10.3)
Calcium: 8.4 mg/dL — ABNORMAL LOW (ref 8.9–10.3)
Chloride: 91 mmol/L — ABNORMAL LOW (ref 98–111)
Chloride: 93 mmol/L — ABNORMAL LOW (ref 98–111)
Creatinine, Ser: 3.14 mg/dL — ABNORMAL HIGH (ref 0.61–1.24)
Creatinine, Ser: 2.49 mg/dL — ABNORMAL HIGH (ref 0.61–1.24)
GFR calc Af Amer: 26 mL/min — ABNORMAL LOW (ref >60)
GFR calc Af Amer: 34 mL/min — ABNORMAL LOW (ref >60)
GFR calc non Af Amer: 22 mL/min — ABNORMAL LOW (ref >60)
GFR calc non Af Amer: 30 mL/min — ABNORMAL LOW (ref >60)
Glucose, Bld: 140 mg/dL — ABNORMAL HIGH (ref 70–99)
Glucose, Bld: 136 mg/dL — ABNORMAL HIGH (ref 70–99)
Potassium: 3.2 mmol/L — ABNORMAL LOW (ref 3.5–5.1)
Potassium: 3 mmol/L — ABNORMAL LOW (ref 3.5–5.1)
Sodium: 133 mmol/L — ABNORMAL LOW (ref 135–145)
Sodium: 134 mmol/L — ABNORMAL LOW (ref 135–145)
Total Bilirubin: 0.8 mg/dL (ref 0.3–1.2)
Total Bilirubin: 0.8 mg/dL (ref 0.3–1.2)
Total Protein: 8.1 g/dL (ref 6.5–8.1)
Total Protein: 7.5 g/dL (ref 6.5–8.1)

## 2019-03-16 LAB — TROPONIN I (HIGH SENSITIVITY)
Troponin I (High Sensitivity): 8 ng/L (ref <18)
Troponin I (High Sensitivity): 8 ng/L (ref <18)
Troponin I (High Sensitivity): 9 ng/L (ref <18)

## 2019-03-16 LAB — URINALYSIS, ROUTINE W REFLEX MICROSCOPIC
Bacteria, UA: NONE SEEN
Bilirubin Urine: NEGATIVE
Glucose, UA: NEGATIVE mg/dL
Hgb urine dipstick: NEGATIVE
Ketones, ur: NEGATIVE mg/dL
Leukocytes,Ua: NEGATIVE
Nitrite: NEGATIVE
Protein, ur: 30 mg/dL — AB
Specific Gravity, Urine: 1.016 (ref 1.005–1.030)
pH: 5 (ref 5.0–8.0)

## 2019-03-16 LAB — ECHOCARDIOGRAM COMPLETE
Height: 71 in
Weight: 7767.25 oz

## 2019-03-16 LAB — CBC
HCT: 38.7 % — ABNORMAL LOW (ref 39.0–52.0)
Hemoglobin: 11.9 g/dL — ABNORMAL LOW (ref 13.0–17.0)
MCH: 25.5 pg — ABNORMAL LOW (ref 26.0–34.0)
MCHC: 30.7 g/dL (ref 30.0–36.0)
MCV: 82.9 fL (ref 80.0–100.0)
Platelets: 317 10*3/uL (ref 150–400)
RBC: 4.67 MIL/uL (ref 4.22–5.81)
RDW: 17.8 % — ABNORMAL HIGH (ref 11.5–15.5)
WBC: 15.5 10*3/uL — ABNORMAL HIGH (ref 4.0–10.5)
nRBC: 0 % (ref 0.0–0.2)

## 2019-03-16 LAB — LACTIC ACID, PLASMA
Lactic Acid, Venous: 2.3 mmol/L (ref 0.5–1.9)
Lactic Acid, Venous: 2.6 mmol/L (ref 0.5–1.9)

## 2019-03-16 LAB — CK: Total CK: 117 U/L (ref 49–397)

## 2019-03-16 LAB — SARS CORONAVIRUS 2 (TAT 6-24 HRS): SARS Coronavirus 2: NEGATIVE

## 2019-03-16 LAB — PROTIME-INR
INR: 1.3 — ABNORMAL HIGH (ref 0.8–1.2)
Prothrombin Time: 15.6 seconds — ABNORMAL HIGH (ref 11.4–15.2)

## 2019-03-16 LAB — CORTISOL: Cortisol, Plasma: 9.2 ug/dL

## 2019-03-16 LAB — MAGNESIUM: Magnesium: 2.1 mg/dL (ref 1.7–2.4)

## 2019-03-16 LAB — BRAIN NATRIURETIC PEPTIDE: B Natriuretic Peptide: 40 pg/mL (ref 0.0–100.0)

## 2019-03-16 MED ORDER — SODIUM CHLORIDE 0.9 % IV SOLN
1.0000 g | Freq: Once | INTRAVENOUS | Status: DC
  Filled 2019-03-16: qty 1

## 2019-03-16 MED ORDER — METOPROLOL TARTRATE 50 MG PO TABS
100.0000 mg | ORAL_TABLET | Freq: Two times a day (BID) | ORAL | Status: DC
  Filled 2019-03-16: qty 2

## 2019-03-16 MED ORDER — RIVAROXABAN 20 MG PO TABS
20.0000 mg | ORAL_TABLET | Freq: Every day | ORAL | Status: DC
  Filled 2019-03-16: qty 1

## 2019-03-16 MED ORDER — METOPROLOL TARTRATE 50 MG PO TABS
100.0000 mg | ORAL_TABLET | Freq: Two times a day (BID) | ORAL | Status: DC
  Administered 2019-03-17: 100 mg via ORAL
  Filled 2019-03-16: qty 2

## 2019-03-16 MED ORDER — DOCUSATE SODIUM 100 MG PO CAPS: 200.0000 mg | ORAL_CAPSULE | Freq: Every day | ORAL | Status: DC | PRN

## 2019-03-16 MED ORDER — PNEUMOCOCCAL VAC POLYVALENT 25 MCG/0.5ML IJ INJ
0.5000 mL | INJECTION | INTRAMUSCULAR | Status: AC
  Administered 2019-03-17: 0.5 mL via INTRAMUSCULAR
  Filled 2019-03-16: qty 0.5

## 2019-03-16 MED ORDER — RIVAROXABAN 15 MG PO TABS: 15.0000 mg | ORAL_TABLET | Freq: Every day | ORAL | Status: DC

## 2019-03-16 MED ORDER — ALBUTEROL SULFATE (2.5 MG/3ML) 0.083% IN NEBU: 3.0000 mL | INHALATION_SOLUTION | Freq: Four times a day (QID) | RESPIRATORY_TRACT | Status: DC | PRN

## 2019-03-16 MED ORDER — SODIUM CHLORIDE 0.9 % IV SOLN
2.0000 g | Freq: Once | INTRAVENOUS | Status: AC
  Administered 2019-03-16: 2 g via INTRAVENOUS
  Filled 2019-03-16: qty 2

## 2019-03-16 MED ORDER — ALLOPURINOL 100 MG PO TABS
100.0000 mg | ORAL_TABLET | Freq: Every day | ORAL | Status: DC
  Administered 2019-03-16 – 2019-03-17 (×2): 100 mg via ORAL
  Filled 2019-03-16 (×4): qty 1

## 2019-03-16 MED ORDER — SODIUM CHLORIDE 0.9 % IV SOLN: INTRAVENOUS | Status: DC

## 2019-03-16 MED ORDER — CITALOPRAM HYDROBROMIDE 20 MG PO TABS
40.0000 mg | ORAL_TABLET | Freq: Every day | ORAL | Status: DC
  Administered 2019-03-16 – 2019-03-17 (×2): 40 mg via ORAL
  Filled 2019-03-16 (×2): qty 2

## 2019-03-16 MED ORDER — SODIUM CHLORIDE 0.9 % IV SOLN
2.0000 g | Freq: Two times a day (BID) | INTRAVENOUS | Status: DC
  Administered 2019-03-16 – 2019-03-17 (×2): 2 g via INTRAVENOUS
  Filled 2019-03-16 (×2): qty 2

## 2019-03-16 MED ORDER — FUROSEMIDE 80 MG PO TABS: 80.0000 mg | ORAL_TABLET | Freq: Two times a day (BID) | ORAL | Status: DC

## 2019-03-16 MED ORDER — LORATADINE 10 MG PO TABS: 10.0000 mg | ORAL_TABLET | Freq: Every day | ORAL | Status: DC | PRN

## 2019-03-16 MED ORDER — BUSPIRONE HCL 5 MG PO TABS
10.0000 mg | ORAL_TABLET | Freq: Three times a day (TID) | ORAL | Status: DC
  Filled 2019-03-16: qty 2

## 2019-03-16 MED ORDER — POTASSIUM CHLORIDE CRYS ER 20 MEQ PO TBCR
40.0000 meq | EXTENDED_RELEASE_TABLET | Freq: Two times a day (BID) | ORAL | Status: DC
  Administered 2019-03-16 – 2019-03-17 (×3): 40 meq via ORAL
  Filled 2019-03-16 (×3): qty 2

## 2019-03-16 MED ORDER — ACETAMINOPHEN 650 MG RE SUPP: 650.0000 mg | Freq: Four times a day (QID) | RECTAL | Status: DC | PRN

## 2019-03-16 MED ORDER — MOMETASONE FURO-FORMOTEROL FUM 200-5 MCG/ACT IN AERO
2.0000 | INHALATION_SPRAY | Freq: Two times a day (BID) | RESPIRATORY_TRACT | Status: DC
  Administered 2019-03-16 – 2019-03-17 (×2): 2 via RESPIRATORY_TRACT
  Filled 2019-03-16: qty 8.8

## 2019-03-16 MED ORDER — BUSPIRONE HCL 5 MG PO TABS
5.0000 mg | ORAL_TABLET | Freq: Three times a day (TID) | ORAL | Status: DC
  Administered 2019-03-16 – 2019-03-17 (×4): 5 mg via ORAL
  Filled 2019-03-16 (×4): qty 1

## 2019-03-16 MED ORDER — SODIUM CHLORIDE 0.9 % IV SOLN
INTRAVENOUS | Status: AC
  Administered 2019-03-16: 17:00:00 via INTRAVENOUS

## 2019-03-16 MED ORDER — APIXABAN 5 MG PO TABS
5.0000 mg | ORAL_TABLET | Freq: Two times a day (BID) | ORAL | Status: DC
  Administered 2019-03-16: 5 mg via ORAL
  Filled 2019-03-16 (×3): qty 1

## 2019-03-16 MED ORDER — ENOXAPARIN SODIUM 30 MG/0.3ML ~~LOC~~ SOLN: 30.0000 mg | SUBCUTANEOUS | Status: DC

## 2019-03-16 MED ORDER — ACETAMINOPHEN 325 MG PO TABS
650.0000 mg | ORAL_TABLET | Freq: Four times a day (QID) | ORAL | Status: DC | PRN
  Administered 2019-03-16: 650 mg via ORAL
  Filled 2019-03-16: qty 2

## 2019-03-16 MED ORDER — POTASSIUM CHLORIDE IN NACL 20-0.9 MEQ/L-% IV SOLN
INTRAVENOUS | Status: DC
  Administered 2019-03-16: 04:00:00 via INTRAVENOUS

## 2019-03-16 MED ORDER — PERFLUTREN LIPID MICROSPHERE
1.0000 mL | INTRAVENOUS | Status: AC | PRN
  Administered 2019-03-16: 2 mL via INTRAVENOUS
  Filled 2019-03-16: qty 10

## 2019-03-16 MED ORDER — SODIUM CHLORIDE 0.9 % IV BOLUS
500.0000 mL | Freq: Once | INTRAVENOUS | Status: AC
  Administered 2019-03-16: 500 mL via INTRAVENOUS

## 2019-03-16 NOTE — Progress Notes (Signed)
*  PRELIMINARY RESULTS* Echocardiogram 2D Echocardiogram has been performed.  Leavy Cella 03/16/2019, 10:11 AM

## 2019-03-16 NOTE — Progress Notes (Signed)
Pharmacy Antibiotic Note  Reginald [name] is a 48 y.o. male admitted on 03/15/2019 with sepsis.  Pharmacy has been consulted for Cefepime dosing.  Plan: Cefepime 2000 mg IV every 12 hours. Monitor labs, c/s, and patient improvement.  Height: 5\' 11"  (180.3 cm) Weight: (!) 485 lb 7.3 oz (220.2 kg) IBW/kg (Calculated) : 75.3  Temp (24hrs), Avg:98.8 F (37.1 C), Min:98.2 F (36.8 C), Max:99.2 F (37.3 C)  Recent Labs  Lab 03/09/19 1131 03/09/19 1817 03/10/19 0510 03/15/19 2315 03/16/19 0039 03/16/19 0527  WBC  --   --  9.4 20.2*  --  15.5*  CREATININE 1.19 1.02 1.06 3.14*  --  2.49*  LATICACIDVEN  --   --   --  2.6* 2.3*  --     Estimated Creatinine Clearance: 69.1 mL/min (A) (by C-G formula based on SCr of 2.49 mg/dL (H)).    No Known Allergies  Antimicrobials this admission: Cefepime 10/25 >>  Cipro PTA med 10/19>>10/24  Dose adjustments this admission: Cefepime  Microbiology results: 10/25 BCx: pending 10/25 UCx: pending   Thank you for allowing pharmacy to be a part of this patient's care.  Margot Ables, PharmD Clinical Pharmacist 03/16/2019 8:23 AM

## 2019-03-16 NOTE — ED Notes (Signed)
CRITICAL VALUE ALERT  Critical Value: Lactic acid 2.6  Date & Time Notied: 03/16/19 0012  Provider Notified: Dr. Wyvonnia Dusky  Orders Received/Actions taken: n/a

## 2019-03-16 NOTE — Progress Notes (Addendum)
ANTICOAGULATION CONSULT NOTE - Initial Consult  Pharmacy Consult for apixaban Indication: atrial fibrillation  No Known Allergies  Patient Measurements: Height: 5\' 11"  (180.3 cm) Weight: (!) 485 lb 7.3 oz (220.2 kg) IBW/kg (Calculated) : 75.3   Vital Signs: Temp: 99.2 F (37.3 C) (10/25 0553) Temp Source: Oral (10/25 0553) BP: 105/50 (10/25 0553) Pulse Rate: 77 (10/25 0553)  Labs: Recent Labs    03/15/19 2315 03/16/19 0039 03/16/19 0527  HGB 12.9*  --  11.9*  HCT 40.6  --  38.7*  PLT 366  --  317  LABPROT  --   --  15.6*  INR  --   --  1.3*  CREATININE 3.14*  --  2.49*  CKTOTAL 117  --   --   TROPONINIHS 9 8 8     Estimated Creatinine Clearance: 69.1 mL/min (A) (by C-G formula based on SCr of 2.49 mg/dL (H)).   Medical History: Past Medical History:  Diagnosis Date  . Anxiety   . Aortic stenosis   . Arthritis   . Asthma   . Back pain   . Cellulitis of skin with lymphangitis   . CHF (congestive heart failure) ([place])   . Chronic diastolic congestive heart failure ([place])   . Chronic venous insufficiency   . Constipation   . COPD (chronic obstructive pulmonary disease) ([place])   . Depression   . Dyspnea   . Essential hypertension   . Gout   . Heart murmur   . Hypertension   . Morbid obesity ([place])   . Obesity   . Pneumonia    walking pneumonia  . Pre-diabetes   . Prostatitis   . Pulmonary embolism ([place])   . Pulmonary hypertension ([place])   . Pyelonephritis   . S/P aortic valve replacement with bioprosthetic valve 08/23/2017   25 mm Edwards Inspiris Resilia stented bovine pericardial tissue valve  . 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 Sleep apnea    cpap   . Thoracic ascending aortic aneurysm ([place])   . Thoracic ascending aortic aneurysm ([place])   . Tobacco abuse     Medications:  Medications Prior to Admission  Medication Sig Dispense Refill Last Dose  . acetaminophen (TYLENOL) 325 MG tablet  Take 650 mg by mouth every 6 (six) hours as needed for mild pain.     Marland Kitchen albuterol (PROVENTIL) (2.5 MG/3ML) 0.083% nebulizer solution INHALE 1 VIAL VIA NEBULIZER EVERY 6 HOURS AS NEEDED FOR FOR WHEEZING OR SHORTNESS OF BREATH (Patient taking differently: Take 2.5 mg by nebulization every 6 (six) hours as needed for wheezing or shortness of breath. ) 270 mL 1   . albuterol (VENTOLIN HFA) 108 (90 Base) MCG/ACT inhaler Inhale 2 puffs into the lungs every 6 (six) hours as needed for wheezing or shortness of breath. 6 g 1   . allopurinol (ZYLOPRIM) 300 MG tablet TAKE 1 Tablet BY MOUTH ONCE DAILY (Patient taking differently: Take 300 mg by mouth daily. ) 90 tablet 1   . busPIRone (BUSPAR) 10 MG tablet Take 10 mg by mouth 3 (three) times daily.      . ciprofloxacin (CIPRO) 500 MG tablet Take 1 tablet (500 mg total) by mouth every 12 (twelve) hours for 7 days. 4 tablet 0   . citalopram (CELEXA) 20 MG tablet Take 40 mg by mouth daily.     Marland Kitchen docusate sodium (COLACE) 100 MG capsule Take 200 mg by mouth daily as needed for mild constipation.      Marland Kitchen  DULERA 200-5 MCG/ACT AERO INHALE 2 PUFFS BY MOUTH TWICE DAILY. RINSE MOUTH AFTER EACH USE (Patient taking differently: Inhale 2 puffs into the lungs 2 (two) times daily. ) 39 g 1   . furosemide (LASIX) 80 MG tablet Take 1 tablet (80 mg total) by mouth 2 (two) times daily. 180 tablet 3   . Krill Oil 350 MG CAPS Take 1 capsule by mouth daily.     Marland Kitchen lisinopril (ZESTRIL) 20 MG tablet TAKE 1 Tablet BY MOUTH ONCE DAILY (Patient taking differently: Take 20 mg by mouth daily. ) 90 tablet 1   . loratadine (CLARITIN) 10 MG tablet Take 10 mg by mouth daily as needed for allergies.     . metolazone (ZAROXOLYN) 5 MG tablet Take 1 tablet (5 mg total) by mouth every Monday, Wednesday, and Friday. 30 tablet 6   . metoprolol tartrate (LOPRESSOR) 100 MG tablet TAKE 1 Tablet  BY MOUTH TWICE DAILY (Patient taking differently: Take 100 mg by mouth 2 (two) times daily. ) 180 tablet 0   .  potassium chloride (K-DUR) 10 MEQ tablet Take 1 tablet (10 mEq total) by mouth daily. Take 3 Tablets on Monday, Wednesday, Friday (Patient taking differently: Take 10-30 mEq by mouth See admin instructions. Take 3 Tablets (30 mEq totally) by mouth on Monday, Wednesday, Friday and 1 tablet (10 mEq totally) by mouth other days) 90 tablet 6   . XARELTO 20 MG TABS tablet TAKE 1 Tablet BY MOUTH ONCE DAILY WITH SUPPER (Patient taking differently: Take 20 mg by mouth daily with supper. ) 90 tablet 0     Assessment: Pharmacy consulted to dose apixaban in patient with atrial fibrillation.  Patient was on Xarelto prior to admission but transitioning to apixaban.  Last Xarelto dose 10/23 1700  Goal of Therapy:   Monitor platelets by anticoagulation protocol: Yes   Plan:  Start apixaban 5 mg twice daily when next Xarelto dose would have been due. Monitor labs and s/s of bleeding.  Revonda Standard Ulyana Pitones 03/16/2019,1:45 PM

## 2019-03-16 NOTE — Progress Notes (Addendum)
ANTICOAGULATION CONSULT NOTE - Preliminary  Pharmacy Consult for Xarelto Indication: atrial fibrillation  No Known Allergies  Patient Measurements: Height: 5\' 11"  (180.3 cm) Weight: (!) 486 lb (220.4 kg) IBW/kg (Calculated) : 75.3 HEPARIN DW (KG): 132   Vital Signs: Temp: 98.9 F (37.2 C) (10/24 2131) Temp Source: Oral (10/24 2131) BP: 87/49 (10/25 0215) Pulse Rate: 76 (10/25 0215)  Labs: Recent Labs    03/15/19 2315  HGB 12.9*  HCT 40.6  PLT 366  CREATININE 3.14*  CKTOTAL 117   Estimated Creatinine Clearance: 54.8 mL/min (A) (by C-G formula based on SCr of 3.14 mg/dL (H)).  Medical History: Past Medical History:  Diagnosis Date  . Anxiety   . Aortic stenosis   . Arthritis   . Asthma   . Back pain   . Cellulitis of skin with lymphangitis   . CHF (congestive heart failure) ([place])   . Chronic diastolic congestive heart failure ([place])   . Chronic venous insufficiency   . Constipation   . COPD (chronic obstructive pulmonary disease) ([place])   . Depression   . Dyspnea   . Essential hypertension   . Gout   . Heart murmur   . Hypertension   . Morbid obesity ([place])   . Obesity   . Pneumonia    walking pneumonia  . Pre-diabetes   . Prostatitis   . Pulmonary embolism ([place])   . Pulmonary hypertension ([place])   . Pyelonephritis   . S/P aortic valve replacement with bioprosthetic valve 08/23/2017   25 mm Edwards Inspiris Resilia stented bovine pericardial tissue valve  . 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 Sleep apnea    cpap   . Thoracic ascending aortic aneurysm ([place])   . Thoracic ascending aortic aneurysm ([place])   . Tobacco abuse     Medications:  (Not in a hospital admission)   Assessment: Pt with PMH Hypertension, Pre-diabetes, CHF (diastolic), Aortic stenosis s/p AVR, AAA, Copd, pulmonary hypertension. Came to ED with hypotension.  Patient has been on Xarelto at home for atrial fibrillation.   Pharmacy has been asked to dose Xarelto.  Pt with ARF SCr=1.06(10/19)>3.14 (10/24) CrCl=> 50 mL/min based on C-G ABW, so can receive home dose as of now, will check at 0500 labs.    Plan:  Xarelto 20mg  daily at supper, will watch CrCl closely and adjust as needed.  Preliminary review of pertinent patient information completed.  Forestine Na clinical pharmacist will complete review during morning rounds to assess the patient and finalize treatment regimen.  Gyneth Hubka, Magdalene Molly, RPH 03/16/2019,2:37 AM

## 2019-03-16 NOTE — Progress Notes (Addendum)
ANTIBIOTIC CONSULT NOTE-Preliminary  Pharmacy Consult for cefepime Indication: sepsis  No Known Allergies  Patient Measurements: Height: 5\' 11"  (180.3 cm) Weight: (!) 486 lb (220.4 kg) IBW/kg (Calculated) : 75.3 Adjusted Body Weight:   Vital Signs: Temp: 98.9 F (37.2 C) (10/24 2131) Temp Source: Oral (10/24 2131) BP: 87/49 (10/25 0215) Pulse Rate: 76 (10/25 0215)  Labs: Recent Labs    03/15/19 2315  WBC 20.2*  HGB 12.9*  PLT 366  CREATININE 3.14*    Estimated Creatinine Clearance: 54.8 mL/min (A) (by C-G formula based on SCr of 3.14 mg/dL (H)).  No results for input(s): VANCOTROUGH, VANCOPEAK, VANCORANDOM, GENTTROUGH, GENTPEAK, GENTRANDOM, TOBRATROUGH, TOBRAPEAK, TOBRARND, AMIKACINPEAK, AMIKACINTROU, AMIKACIN in the last 72 hours.   Microbiology: Recent Results (from the past 720 hour(s))  Novel Coronavirus, NAA ([facility])     Status: None   Collection Time: 03/07/19 11:42 AM   Specimen: Oropharyngeal swab; Nasopharyngeal(NP) swabs in vial transport medium   NASOPHARYNGE  Result Value Ref Range Status   SARS-CoV-2, NAA Not Detected Not Detected Final    Comment: This nucleic acid amplification test was developed and its performance characteristics determined by [employer]. Nucleic acid amplification tests include PCR and TMA. This test has not been FDA cleared or approved. This test has been authorized by FDA under an Emergency Use Authorization (EUA). This test is only authorized for the duration of time the declaration that circumstances exist justifying the authorization of the emergency use of in vitro diagnostic tests for detection of SARS-CoV-2 virus and/or diagnosis of COVID-19 infection under section 564(b)(1) of the Act, 21 U.S.C. [id number]) (1), unless the authorization is terminated or revoked sooner. When diagnostic testing is negative, the possibility of a false negative result should be considered in the context of a patient's recent  exposures and the presence of clinical signs and symptoms consistent with COVID-19. An individual without symptoms of COVID-19 and who is not shedding SARS-CoV-2 virus would  expect to have a negative (not detected) result in this assay.   Urine Culture     Status: Abnormal   Collection Time: 03/07/19 12:07 PM   Specimen: Urine, Random  Result Value Ref Range Status   Specimen Description   Final    URINE, RANDOM Performed at [facility] [address], [place], [place] [address]    Special Requests   Final    NONE Performed at [facility], [address], [place], [place] [address]    Culture >=100,000 COLONIES/mL ESCHERICHIA COLI (A)  Final   Report Status 03/09/2019 FINAL  Final   Organism ID, Bacteria ESCHERICHIA COLI (A)  Final      Susceptibility   Escherichia coli - MIC*    AMPICILLIN <=2 SENSITIVE Sensitive     CEFAZOLIN <=4 SENSITIVE Sensitive     CEFTRIAXONE <=1 SENSITIVE Sensitive     CIPROFLOXACIN <=0.25 SENSITIVE Sensitive     GENTAMICIN <=1 SENSITIVE Sensitive     IMIPENEM <=0.25 SENSITIVE Sensitive     NITROFURANTOIN <=16 SENSITIVE Sensitive     TRIMETH/SULFA <=20 SENSITIVE Sensitive     AMPICILLIN/SULBACTAM <=2 SENSITIVE Sensitive     PIP/TAZO <=4 SENSITIVE Sensitive     Extended ESBL NEGATIVE Sensitive     * >=100,000 COLONIES/mL ESCHERICHIA COLI  Culture, group A strep (throat)     Status: None   Collection Time: 03/08/19 11:25 AM   Specimen: Throat  Result Value Ref Range Status   Specimen Description THROAT  Final   Special Requests NONE  Final  Culture   Final    NO GROUP A STREP (S.PYOGENES) ISOLATED Performed at [facility], [place] [address], [place], [place] [address]    Report Status 03/11/2019 FINAL  Final  Blood culture (routine x 2)     Status: None   Collection Time: 03/08/19  1:10 PM   Specimen: BLOOD  Result Value Ref Range Status   Specimen Description BLOOD LEFT ANTECUBITAL  Final   Special Requests   Final    BOTTLES  DRAWN AEROBIC AND ANAEROBIC Blood Culture adequate volume   Culture   Final    NO GROWTH 5 DAYS Performed at [facility], [place] [address], [place], [place] [address]    Report Status 03/13/2019 FINAL  Final  Urine culture     Status: None   Collection Time: 03/08/19  1:10 PM   Specimen: Urine, Random  Result Value Ref Range Status   Specimen Description URINE, RANDOM  Final   Special Requests NONE  Final   Culture   Final    NO GROWTH Performed at [facility], [place] [address], [place], [place] [address]    Report Status 03/09/2019 FINAL  Final  Blood culture (routine x 2)     Status: None   Collection Time: 03/08/19  1:45 PM   Specimen: BLOOD LEFT HAND  Result Value Ref Range Status   Specimen Description BLOOD LEFT HAND  Final   Special Requests   Final    BOTTLES DRAWN AEROBIC AND ANAEROBIC Blood Culture adequate volume   Culture   Final    NO GROWTH 5 DAYS Performed at [facility], [place] [address] [place], [place] [address]    Report Status 03/13/2019 FINAL  Final  SARS CORONAVIRUS 2 (TAT 6-24 HRS) Nasopharyngeal Nasopharyngeal Swab     Status: None   Collection Time: 03/08/19  2:30 PM   Specimen: Nasopharyngeal Swab  Result Value Ref Range Status   SARS Coronavirus 2 NEGATIVE NEGATIVE Final    Comment: (NOTE) SARS-CoV-2 target nucleic acids are NOT DETECTED. The SARS-CoV-2 RNA is generally detectable in upper and lower respiratory specimens during the acute phase of infection. Negative results do not preclude SARS-CoV-2 infection, do not rule out co-infections with other pathogens, and should not be used as the sole basis for treatment or other patient management decisions. Negative results must be combined with clinical observations, patient history, and epidemiological information. The expected result is Negative. Fact Sheet for Patients: SugarRoll[employer]be Fact Sheet for Healthcare  Providers: https://www.woods-mathews.com/ This test is not yet approved or cleared by the [place] FDA and  has been authorized for detection and/or diagnosis of SARS-CoV-2 by FDA under an Emergency Use Authorization (EUA). This EUA will remain  in effect (meaning this test can be used) for the duration of the COVID-19 declaration under Section 56 4(b)(1) of the Act, 21 U.S.C. section 360bbb-3(b)(1), unless the authorization is terminated or revoked sooner. Performed at [facility], [place] [address], [place],  [address]     Medical History: Past Medical History:  Diagnosis Date  . Anxiety   . Aortic stenosis   . Arthritis   . Asthma   . Back pain   . Cellulitis of skin with lymphangitis   . CHF (congestive heart failure) ([place])   . Chronic diastolic congestive heart failure ([place])   . Chronic venous insufficiency   . Constipation   . COPD (chronic obstructive pulmonary disease) ([place])   . Depression   .  Dyspnea   . Essential hypertension   . Gout   . Heart murmur   . Hypertension   . Morbid obesity ([place])   . Obesity   . Pneumonia    walking pneumonia  . Pre-diabetes   . Prostatitis   . Pulmonary embolism ([place])   . Pulmonary hypertension ([place])   . Pyelonephritis   . S/P aortic valve replacement with bioprosthetic valve 08/23/2017   25 mm Edwards Inspiris Resilia stented bovine pericardial tissue valve  . 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 Sleep apnea    cpap   . Thoracic ascending aortic aneurysm ([place])   . Thoracic ascending aortic aneurysm ([place])   . Tobacco abuse     Medications:  Anti-infectives (From admission, onward)   Start     Dose/Rate Route Frequency Ordered Stop   03/16/19 0315  ceFEPIme (MAXIPIME) 1 g in sodium chloride 0.9 % 100 mL IVPB     1 g 200 mL/hr over 30 Minutes Intravenous  Once 03/16/19 0306        Assessment: Pt presents with acute renal failure,  hypotension, leukocytosis, and elevated lactic acid.  Pharmacy has been asked to dose cefepime for sepsis.    Plan:  Preliminary review of pertinent patient information completed.  Protocol will be initiated with dose(s) of cefepime 2 gram.  Forestine Na clinical pharmacist will complete review during morning rounds to assess patient and finalize treatment regimen if needed.  Hadassah Rana Scarlett, RPH 03/16/2019,3:07 AM

## 2019-03-16 NOTE — Progress Notes (Signed)
PROGRESS NOTE    Reginald [name]  [id number] DOB: Jun 22, 1970 DOA: 03/15/2019 PCP: [name], PA-C   Brief Narrative:  Per HPI: Reginald [name]  is a 48 y.o. male,  Hypertension, Pre-diabetes, CHF (diastolic), Aortic stenosis s/p AVR, AAA, Copd, pulmonary hypertension, OSA, recent admission for  Hypotension, and sepsis secondary to pyelonephritis 03/08/2019, presents with c/o dizziness, and low bp and body cramp and decrease in urine output ,  Pt denies NSAID use, or any other recent medication change except for cipro.  Pt denies diarrhea.   10/25: Patient was recently discharged on 03/10/2019 after treatment for E. coli pyelonephritis and was discharged with ciprofloxacin.  He was noted to have hypotension during his hospitalization which subsequently resolved after holding his medications and he was started back on full dose of all of his medications.  He was noted to have some dizziness at home with low blood pressure readings and body cramps and has been admitted with AKI with concerns for sepsis related to ongoing UTI/pyelonephritis.  He has been started on IV cefepime.  We will plan to give further IV fluid and potassium supplementation today.  Monitor cultures and labs closely.  Assessment & Plan:   Principal Problem:   ARF (acute renal failure) (HCC) Active Problems:   Essential hypertension   Sepsis ([place])   Abnormal liver function   History of pulmonary embolism   Sepsis secondary to E. coli UTI/pyelonephritis -Recent hospitalization for same and patient has been on ciprofloxacin at home -Continue IV cefepime -Follow CBC and lactic acid -Blood cultures x2 pending -Continue IV fluid  AKI-multifactorial, improving -Likely secondary to sepsis, but also persistent hypotension with reinitiation of home diuretics and nephrotoxic agents -Urine studies as well as abdominal ultrasound pending -Continue IV fluid with normal saline as ordered -Strict I's and O's -Repeat a.m.  labs -Hold nephrotoxic agents  Hypokalemia -Recent diarrhea noted -We will plan to continue oral repletion -Recheck in a.m. along with magnesium  Hypotension with history of hypertension -High-sensitivity troponins are negative -2D echocardiogram pending -Cortisol pending -Holding home blood pressure agents except for metoprolol  Paroxysmal atrial fibrillation with history of AI status post AVR -Continue metoprolol and monitor closely on telemetry -Discontinue Xarelto in favor for Eliquis anticoagulation given AKI  History of gout -Allopurinol 100 mg p.o. daily  Constipation -Continue Colace  Anxiety -Continue Celexa and BuSpar  Asthma/COPD -Continue albuterol as needed along with Dulera twice daily   DVT prophylaxis: Xarelto to Eliquis Code Status: Full Family Communication: None at bedside Disposition Plan: IV fluid resuscitation and close monitoring of the labs.  Continue IV cefepime for sepsis.   Consultants:   None  Procedures:   None  Antimicrobials:  Anti-infectives (From admission, onward)   Start     Dose/Rate Route Frequency Ordered Stop   03/16/19 1700  ceFEPIme (MAXIPIME) 2 g in sodium chloride 0.9 % 100 mL IVPB     2 g 200 mL/hr over 30 Minutes Intravenous Every 12 hours 03/16/19 0819     03/16/19 0315  ceFEPIme (MAXIPIME) 1 g in sodium chloride 0.9 % 100 mL IVPB  Status:  Discontinued     1 g 200 mL/hr over 30 Minutes Intravenous  Once 03/16/19 0306 03/16/19 0311   03/16/19 0315  ceFEPIme (MAXIPIME) 2 g in sodium chloride 0.9 % 100 mL IVPB     2 g 200 mL/hr over 30 Minutes Intravenous  Once 03/16/19 [id number] 03/16/19 0426       Subjective: Patient seen and evaluated today  with no new acute complaints or concerns. No acute concerns or events noted overnight.  Objective: Vitals:   03/16/19 0215 03/16/19 0335 03/16/19 0353 03/16/19 0553  BP: (!) 87/49 (!) 100/57  (!) 105/50  Pulse: 76 79 79 77  Resp:  18 17 18   Temp:  98.2 F (36.8 C)   99.2 F (37.3 C)  TempSrc:  Oral  Oral  SpO2: 98% 98% 97% 98%  Weight:  (!) 220.2 kg    Height:  5\' 11"  (1.803 m)      Intake/Output Summary (Last 24 hours) at 03/16/2019 0956 Last data filed at 03/16/2019 0400 Gross per 24 hour  Intake 1030 ml  Output 200 ml  Net 830 ml   Filed Weights   03/15/19 2133 03/16/19 0335  Weight: (!) 220.4 kg (!) 220.2 kg    Examination:  General exam: Appears calm and comfortable, obese Respiratory system: Clear to auscultation. Respiratory effort normal.  Nasal cannula oxygen. Cardiovascular system: S1 & S2 heard, RRR. No JVD, murmurs, rubs, gallops or clicks. No pedal edema. Gastrointestinal system: Abdomen is nondistended, soft and nontender. No organomegaly or masses felt. Normal bowel sounds heard. Central nervous system: Alert and oriented. No focal neurological deficits. Extremities: Symmetric 5 x 5 power. Skin: No rashes, lesions or ulcers Psychiatry: Judgement and insight appear normal. Mood & affect appropriate.     Data Reviewed: I have personally reviewed following labs and imaging studies  CBC: Recent Labs  Lab 03/10/19 0510 03/15/19 2315 03/16/19 0527  WBC 9.4 20.2* 15.5*  NEUTROABS  --  15.7*  --   HGB 11.5* 12.9* 11.9*  HCT 36.7* 40.6 38.7*  MCV 82.7 82.7 82.9  PLT 270 366 [id number]   Basic Metabolic Panel: Recent Labs  Lab 03/09/19 1131 03/09/19 1817 03/10/19 0510 03/15/19 2315 03/16/19 0527  NA 130* 131* 134* 133* 134*  K 3.0* 3.9 3.3* 3.2* 3.0*  CL 90* 91* 94* 91* 93*  CO2 24 25 27 27 26   GLUCOSE 214* 148* 140* 140* 136*  BUN 22* 20 18 52* 54*  CREATININE 1.19 1.02 1.06 3.14* 2.49*  CALCIUM 7.4* 7.9* 7.9* 8.6* 8.4*  MG 2.1  --   --  2.1  --    GFR: Estimated Creatinine Clearance: 69.1 mL/min (A) (by C-G formula based on SCr of 2.49 mg/dL (H)). Liver Function Tests: Recent Labs  Lab 03/15/19 2315 03/16/19 0527  AST 51* 44*  ALT 42 37  ALKPHOS 71 66  BILITOT 0.8 0.8  PROT 8.1 7.5  ALBUMIN 3.5 3.1*    No results for input(s): LIPASE, AMYLASE in the last 168 hours. No results for input(s): AMMONIA in the last 168 hours. Coagulation Profile: Recent Labs  Lab 03/16/19 0527  INR 1.3*   Cardiac Enzymes: Recent Labs  Lab 03/15/19 2315  CKTOTAL 117   BNP (last 3 results) No results for input(s): PROBNP in the last 8760 hours. HbA1C: No results for input(s): HGBA1C in the last 72 hours. CBG: Recent Labs  Lab 03/10/19 0616  GLUCAP 132*   Lipid Profile: No results for input(s): CHOL, HDL, LDLCALC, TRIG, CHOLHDL, LDLDIRECT in the last 72 hours. Thyroid Function Tests: No results for input(s): TSH, T4TOTAL, FREET4, T3FREE, THYROIDAB in the last 72 hours. Anemia Panel: No results for input(s): VITAMINB12, FOLATE, FERRITIN, TIBC, IRON, RETICCTPCT in the last 72 hours. Sepsis Labs: Recent Labs  Lab 03/15/19 2315 03/16/19 0039  LATICACIDVEN 2.6* 2.3*    Recent Results (from the past 240 hour(s))  Novel Coronavirus, NAA (  [facility])     Status: None   Collection Time: 03/07/19 11:42 AM   Specimen: Oropharyngeal swab; Nasopharyngeal(NP) swabs in vial transport medium   NASOPHARYNGE  Result Value Ref Range Status   SARS-CoV-2, NAA Not Detected Not Detected Final    Comment: This nucleic acid amplification test was developed and its performance characteristics determined by [employer]. Nucleic acid amplification tests include PCR and TMA. This test has not been FDA cleared or approved. This test has been authorized by FDA under an Emergency Use Authorization (EUA). This test is only authorized for the duration of time the declaration that circumstances exist justifying the authorization of the emergency use of in vitro diagnostic tests for detection of SARS-CoV-2 virus and/or diagnosis of COVID-19 infection under section 564(b)(1) of the Act, 21 U.S.C. [id number]) (1), unless the authorization is terminated or revoked sooner. When diagnostic testing is negative, the  possibility of a false negative result should be considered in the context of a patient's recent exposures and the presence of clinical signs and symptoms consistent with COVID-19. An individual without symptoms of COVID-19 and who is not shedding SARS-CoV-2 virus would  expect to have a negative (not detected) result in this assay.   Urine Culture     Status: Abnormal   Collection Time: 03/07/19 12:07 PM   Specimen: Urine, Random  Result Value Ref Range Status   Specimen Description   Final    URINE, RANDOM Performed at [facility] [address], [place], [place] [address]    Special Requests   Final    NONE Performed at [facility], [address], [place], [place] [address]    Culture >=100,000 COLONIES/mL ESCHERICHIA COLI (A)  Final   Report Status 03/09/2019 FINAL  Final   Organism ID, Bacteria ESCHERICHIA COLI (A)  Final      Susceptibility   Escherichia coli - MIC*    AMPICILLIN <=2 SENSITIVE Sensitive     CEFAZOLIN <=4 SENSITIVE Sensitive     CEFTRIAXONE <=1 SENSITIVE Sensitive     CIPROFLOXACIN <=0.25 SENSITIVE Sensitive     GENTAMICIN <=1 SENSITIVE Sensitive     IMIPENEM <=0.25 SENSITIVE Sensitive     NITROFURANTOIN <=16 SENSITIVE Sensitive     TRIMETH/SULFA <=20 SENSITIVE Sensitive     AMPICILLIN/SULBACTAM <=2 SENSITIVE Sensitive     PIP/TAZO <=4 SENSITIVE Sensitive     Extended ESBL NEGATIVE Sensitive     * >=100,000 COLONIES/mL ESCHERICHIA COLI  Culture, group A strep (throat)     Status: None   Collection Time: 03/08/19 11:25 AM   Specimen: Throat  Result Value Ref Range Status   Specimen Description THROAT  Final   Special Requests NONE  Final   Culture   Final    NO GROUP A STREP (S.PYOGENES) ISOLATED Performed at [facility] [address] [place], [place] [address]    Report Status 03/11/2019 FINAL  Final  Blood culture (routine x 2)     Status: None   Collection Time: 03/08/19  1:10 PM   Specimen: BLOOD  Result Value Ref Range  Status   Specimen Description BLOOD LEFT ANTECUBITAL  Final   Special Requests   Final    BOTTLES DRAWN AEROBIC AND ANAEROBIC Blood Culture adequate volume   Culture   Final    NO GROWTH 5 DAYS Performed at [facility], [place] [address], [place], [place] [address]    Report Status 03/13/2019 FINAL  Final  Urine culture  Status: None   Collection Time: 03/08/19  1:10 PM   Specimen: Urine, Random  Result Value Ref Range Status   Specimen Description URINE, RANDOM  Final   Special Requests NONE  Final   Culture   Final    NO GROWTH Performed at [facility], [address], [place], [place] [address]    Report Status 03/09/2019 FINAL  Final  Blood culture (routine x 2)     Status: None   Collection Time: 03/08/19  1:45 PM   Specimen: BLOOD LEFT HAND  Result Value Ref Range Status   Specimen Description BLOOD LEFT HAND  Final   Special Requests   Final    BOTTLES DRAWN AEROBIC AND ANAEROBIC Blood Culture adequate volume   Culture   Final    NO GROWTH 5 DAYS Performed at [facility], [place] [address], [place], [place] [address]    Report Status 03/13/2019 FINAL  Final  SARS CORONAVIRUS 2 (TAT 6-24 HRS) Nasopharyngeal Nasopharyngeal Swab     Status: None   Collection Time: 03/08/19  2:30 PM   Specimen: Nasopharyngeal Swab  Result Value Ref Range Status   SARS Coronavirus 2 NEGATIVE NEGATIVE Final    Comment: (NOTE) SARS-CoV-2 target nucleic acids are NOT DETECTED. The SARS-CoV-2 RNA is generally detectable in upper and lower respiratory specimens during the acute phase of infection. Negative results do not preclude SARS-CoV-2 infection, do not rule out co-infections with other pathogens, and should not be used as the sole basis for treatment or other patient management decisions. Negative results must be combined with clinical observations, patient history, and epidemiological information. The expected result is Negative. Fact Sheet for Patients:  SugarRoll.be Fact Sheet for Healthcare Providers: https://www.woods-mathews.com/ This test is not yet approved or cleared by the [place] FDA and  has been authorized for detection and/or diagnosis of SARS-CoV-2 by FDA under an Emergency Use Authorization (EUA). This EUA will remain  in effect (meaning this test can be used) for the duration of the COVID-19 declaration under Section 56 4(b)(1) of the Act, 21 U.S.C. section 360bbb-3(b)(1), unless the authorization is terminated or revoked sooner. Performed at [facility], [place] [address], [place], [place] [address]   Blood culture (routine x 2)     Status: None (Preliminary result)   Collection Time: 03/16/19  7:22 AM   Specimen: BLOOD RIGHT HAND  Result Value Ref Range Status   Specimen Description   Final    BLOOD RIGHT HAND BOTTLES DRAWN AEROBIC AND ANAEROBIC   Special Requests Blood Culture adequate volume  Final   Culture   Final    NO GROWTH < 12 HOURS Performed at [facility], [address], [place], [place] [address]    Report Status PENDING  Incomplete  Blood culture (routine x 2)     Status: None (Preliminary result)   Collection Time: 03/16/19  7:22 AM   Specimen: BLOOD RIGHT HAND  Result Value Ref Range Status   Specimen Description   Final    BLOOD RIGHT HAND BOTTLES DRAWN AEROBIC AND ANAEROBIC   Special Requests Blood Culture adequate volume  Final   Culture   Final    NO GROWTH < 12 HOURS Performed at [facility], [address], [place], [place] [address]    Report Status PENDING  Incomplete         Radiology Studies: Dg Chest Portable 1 View  Result Date: 03/16/2019 CLINICAL DATA:  48 year old male with weakness. EXAM:  PORTABLE CHEST 1 VIEW COMPARISON:  Chest radiograph dated 03/08/2019 FINDINGS: There is no focal consolidation, pleural effusion, or pneumothorax. Stable cardiomegaly. Median sternotomy wires and left pectoral pacemaker device. No acute  osseous pathology. IMPRESSION: 1. No acute cardiopulmonary process. 2. Stable cardiomegaly. Electronically Signed   By: Anner Crete M.D.   On: 03/16/2019 00:06        Scheduled Meds: . allopurinol  100 mg Oral Daily  . busPIRone  5 mg Oral TID  . citalopram  40 mg Oral Daily  . mometasone-formoterol  2 puff Inhalation BID  . [START ON 03/17/2019] pneumococcal 23 valent vaccine  0.5 mL Intramuscular Tomorrow-1000  . potassium chloride  40 mEq Oral BID   Continuous Infusions: . sodium chloride    . ceFEPime (MAXIPIME) IV       LOS: 0 days    Time spent: 30 minutes    Sarah Zerby Darleen Crocker, DO [facility] Pager [phone]  If 7PM-7AM, please contact night-coverage [email or web address] Password [email or web address] 03/16/2019, 9:56 AM

## 2019-03-16 NOTE — H&P (Addendum)
TRH H&P    Patient Demographics:    Reginald [name], is a 48 y.o. male  MRN: [id number]  DOB - February 13, 1971  Admit Date - 03/15/2019  Referring MD/NP/PA: Ranc[place]  Outpatient Primary MD for the patient is [name], PA-C  Patient coming from:  home  Chief complaint- hypotension   HPI:    Reginald [name]  is a 48 y.o. male,  Hypertension, Pre-diabetes, CHF (diastolic), Aortic stenosis s/p AVR, AAA, Copd, pulmonary hypertension, OSA, recent admission for  Hypotension, and sepsis secondary to pyelonephritis 03/08/2019, presents with c/o dizziness, and low bp and body cramp and decrease in urine output ,  Pt denies NSAID use, or any other recent medication change except for cipro.  Pt denies diarrhea.   In ED T 98.9, P 95 R 23, Bp 137/60  Pox 96% on RA WT 220.4kg  Wbc 20.2, Hgb 12.9, Plt 366 Na 133, K 3.2, Bun 52, Creatinine 3.14 Ast 51, Alt 42, Alk phos 71, T. Bili 0.8   CPK 117 Lactic acid 2.6  Trop 9 BNP 40  EKG nsr at 80, RAD, RBBB  Pt will be admitted for sepsis of uncertain etiology ? Uti, and acute renal failure.       Review of systems:    In addition to the HPI above,  No Fever-chills, No Headache, No changes with Vision or hearing, No problems swallowing food or Liquids, No Chest pain, Cough or Shortness of Breath, No Abdominal pain, No Nausea or Vomiting, bowel movements are regular, No Blood in stool or Urine, No dysuria, No new skin rashes or bruises, No new joints pains-aches,  No new weakness, tingling, numbness in any extremity, No recent weight gain or loss, No polyuria, polydypsia or polyphagia, No significant Mental Stressors.  All other systems reviewed and are negative.    Past History of the following :    Past Medical History:  Diagnosis Date  . Anxiety   . Aortic stenosis   . Arthritis   . Asthma   . Back pain   . Cellulitis of skin with lymphangitis    . CHF (congestive heart failure) ([place])   . Chronic diastolic congestive heart failure ([place])   . Chronic venous insufficiency   . Constipation   . COPD (chronic obstructive pulmonary disease) ([place])   . Depression   . Dyspnea   . Essential hypertension   . Gout   . Heart murmur   . Hypertension   . Morbid obesity ([place])   . Obesity   . Pneumonia    walking pneumonia  . Pre-diabetes   . Prostatitis   . Pulmonary embolism ([place])   . Pulmonary hypertension ([place])   . Pyelonephritis   . S/P aortic valve replacement with bioprosthetic valve 08/23/2017   25 mm Edwards Inspiris Resilia stented bovine pericardial tissue valve  . 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 Sleep apnea    cpap   . Thoracic ascending aortic aneurysm ([place])   . Thoracic ascending aortic  aneurysm ([place])   . Tobacco abuse       Past Surgical History:  Procedure Laterality Date  . AORTIC VALVE REPLACEMENT N/A 08/23/2017   Procedure: AORTIC VALVE REPLACEMENT (AVR) USING INSPIRIS RESILIA AORTIC VALVE SIZE 25 MM;  Surgeon: Rexene Alberts, MD;  Location: [place];  Service: Open Heart Surgery;  Laterality: N/A;  . MULTIPLE EXTRACTIONS WITH ALVEOLOPLASTY N/A 07/23/2017   Procedure: Extraction of tooth #10 with alveoloplasty and gross debridement of remaining teeth;  Surgeon: Lenn Cal, DDS;  Location: [facility];  Service: Oral Surgery;  Laterality: N/A;  . NO PAST SURGERIES    . PACEMAKER IMPLANT N/A 08/28/2017    St Jude Medical Assurity MRI conditional  dual-chamber pacemaker for symptomatic complete heart blockby Dr Rayann Heman  . TEE WITHOUT CARDIOVERSION N/A 07/16/2017   Procedure: TRANSESOPHAGEAL ECHOCARDIOGRAM (TEE);  Surgeon: Lelon Perla, MD;  Location[facility] ENDOSCOPY;  Service: Cardiovascular;  Laterality: N/A;  . TEE WITHOUT CARDIOVERSION N/A 08/23/2017   Procedure: TRANSESOPHAGEAL ECHOCARDIOGRAM (TEE);  Surgeon: Rexene Alberts, MD;  Location: [facility];  Service:  Open Heart Surgery;  Laterality: N/A;  . THORACIC AORTIC ANEURYSM REPAIR N/A 08/23/2017   Procedure: THORACIC ASCENDING ANEURYSM REPAIR (AAA) USING HEMASHIELD GOLD KNITTED MICROVEL DOUBLE VELOUR VASCULAR GRAFT D: 24 MM  L: 30 CM;  Surgeon: Rexene Alberts, MD;  Location: [facility];  Service: Open Heart Surgery;  Laterality: N/A;      Social History:      Social History   Tobacco Use  . Smoking status: Former Smoker    Packs/day: 0.50    Types: Cigarettes    Quit date: 08/23/2017    Years since quitting: 1.5  . Smokeless tobacco: Never Used  Substance Use Topics  . Alcohol use: No    Frequency: Never    Comment: have had alcohol in the past, not heavy       Family History :     Family History  Problem Relation Age of Onset  . Hypertension Mother   . Alzheimer's disease Mother   . Heart attack Brother   . Diabetes Brother        Home Medications:   Prior to Admission medications   Medication Sig Start Date End Date Taking? Authorizing Provider  acetaminophen (TYLENOL) 325 MG tablet Take 650 mg by mouth every 6 (six) hours as needed for mild pain.    [provider]  albuterol (PROVENTIL) (2.5 MG/3ML) 0.083% nebulizer solution INHALE 1 VIAL VIA NEBULIZER EVERY 6 HOURS AS NEEDED FOR FOR WHEEZING OR SHORTNESS OF BREATH Patient taking differently: Take 2.5 mg by nebulization every 6 (six) hours as needed for wheezing or shortness of breath.  08/08/18   [name], PA-C  albuterol (VENTOLIN HFA) 108 (90 Base) MCG/ACT inhaler Inhale 2 puffs into the lungs every 6 (six) hours as needed for wheezing or shortness of breath. 02/03/19   [name], PA-C  allopurinol (ZYLOPRIM) 300 MG tablet TAKE 1 Tablet BY MOUTH ONCE DAILY Patient taking differently: Take 300 mg by mouth daily.  10/31/18   [name], PA-C  busPIRone (BUSPAR) 10 MG tablet Take 10 mg by mouth 3 (three) times daily.  12/09/18   [provider]  ciprofloxacin (CIPRO) 500 MG tablet Take 1  tablet (500 mg total) by mouth every 12 (twelve) hours for 7 days. 03/10/19 03/17/19  Masoudi, Dorthula Rue, MD  citalopram (CELEXA) 20 MG tablet Take 40 mg by mouth daily.    [provider]  docusate  sodium (COLACE) 100 MG capsule Take 200 mg by mouth daily as needed for mild constipation.     [provider]  DULERA 200-5 MCG/ACT AERO INHALE 2 PUFFS BY MOUTH TWICE DAILY. RINSE MOUTH AFTER EACH USE Patient taking differently: Inhale 2 puffs into the lungs 2 (two) times daily.  01/24/19   [name], PA-C  furosemide (LASIX) 80 MG tablet Take 1 tablet (80 mg total) by mouth 2 (two) times daily. 03/11/19   Masoudi, Dorthula Rue, MD  Krill Oil 350 MG CAPS Take 1 capsule by mouth daily.    [provider]  lisinopril (ZESTRIL) 20 MG tablet TAKE 1 Tablet BY MOUTH ONCE DAILY Patient taking differently: Take 20 mg by mouth daily.  10/31/18   [name], PA-C  loratadine (CLARITIN) 10 MG tablet Take 10 mg by mouth daily as needed for allergies.    [provider]  metolazone (ZAROXOLYN) 5 MG tablet Take 1 tablet (5 mg total) by mouth every Monday, Wednesday, and Friday. 03/12/19 06/10/19  Masoudi, Dorthula Rue, MD  metoprolol tartrate (LOPRESSOR) 100 MG tablet TAKE 1 Tablet  BY MOUTH TWICE DAILY Patient taking differently: Take 100 mg by mouth 2 (two) times daily.  01/24/19   [name], PA-C  potassium chloride (K-DUR) 10 MEQ tablet Take 1 tablet (10 mEq total) by mouth daily. Take 3 Tablets on Monday, Wednesday, Friday Patient taking differently: Take 10-30 mEq by mouth See admin instructions. Take 3 Tablets (30 mEq totally) by mouth on Monday, Wednesday, Friday and 1 tablet (10 mEq totally) by mouth other days 02/14/19 05/15/19  Strader, Fransisco Hertz, PA-C  XARELTO 20 MG TABS tablet TAKE 1 Tablet BY MOUTH ONCE DAILY WITH SUPPER Patient taking differently: Take 20 mg by mouth daily with supper.  01/24/19   [name], PA-C     Allergies:    No Known  Allergies   Physical Exam:   Vitals  Blood pressure (!) 99/54, pulse 77, temperature 98.9 F (37.2 C), temperature source Oral, resp. rate 19, height '5\' 11"'  (1.803 m), weight (!) 220.4 kg, SpO2 97 %.  1.  General: axoxo3  2. Psychiatric: euthymic  3. Neurologic: cn2-12 intact, reflexes 2+ symmetric, diffuse with no clonus, motor 5/5 in all 4 ext   4. HEENMT:  Anicteric, pupils 1.25m symmetric, direct, consensual near intact Neck: no jvd  5. Respiratory : CTAB  6. Cardiovascular : rrr s1, s2, 2/6 sem rusb  7. Gastrointestinal:  Abd: soft, nt, nd, +bs, morbidly obese  8. Skin:  Ext: no c/c/e,   9.Musculoskeletal:  Good ROM    Data Review:    CBC Recent Labs  Lab 03/09/19 0339 03/10/19 0510 03/15/19 2315  WBC 12.3* 9.4 20.2*  HGB 11.6* 11.5* 12.9*  HCT 35.6* 36.7* 40.6  PLT 267 270 366  MCV 81.1 82.7 82.7  MCH 26.4 25.9* 26.3  MCHC 32.6 31.3 31.8  RDW 17.3* 17.2* 17.7*  LYMPHSABS  --   --  3.0  MONOABS  --   --  0.8  EOSABS  --   --  0.3  BASOSABS  --   --  0.1   ------------------------------------------------------------------------------------------------------------------  Results for orders placed or performed during the hospital encounter of 03/15/19 (from the past 48 hour(s))  CBC with Differential/Platelet     Status: Abnormal   Collection Time: 03/15/19 11:15 PM  Result Value Ref Range   WBC 20.2 (H) 4.0 - 10.5 K/uL   RBC 4.91 4.22 - 5.81 MIL/uL   Hemoglobin 12.9 (L) 13.0 -  17.0 g/dL   HCT 40.6 39.0 - 52.0 %   MCV 82.7 80.0 - 100.0 fL   MCH 26.3 26.0 - 34.0 pg   MCHC 31.8 30.0 - 36.0 g/dL   RDW 17.7 (H) 11.5 - 15.5 %   Platelets 366 150 - 400 K/uL   nRBC 0.0 0.0 - 0.2 %   Neutrophils Relative % 77 %   Neutro Abs 15.7 (H) 1.7 - 7.7 K/uL   Lymphocytes Relative 15 %   Lymphs Abs 3.0 0.7 - 4.0 K/uL   Monocytes Relative 4 %   Monocytes Absolute 0.8 0.1 - 1.0 K/uL   Eosinophils Relative 2 %   Eosinophils Absolute 0.3 0.0 - 0.5 K/uL    Basophils Relative 0 %   Basophils Absolute 0.1 0.0 - 0.1 K/uL   Immature Granulocytes 2 %   Abs Immature Granulocytes 0.34 (H) 0.00 - 0.07 K/uL    Comment: Performed at [facility], [address] [place], [place] [address]  Comprehensive metabolic panel     Status: Abnormal   Collection Time: 03/15/19 11:15 PM  Result Value Ref Range   Sodium 133 (L) 135 - 145 mmol/L   Potassium 3.2 (L) 3.5 - 5.1 mmol/L   Chloride 91 (L) 98 - 111 mmol/L   CO2 27 22 - 32 mmol/L   Glucose, Bld 140 (H) 70 - 99 mg/dL   BUN 52 (H) 6 - 20 mg/dL   Creatinine, Ser 3.14 (H) 0.61 - 1.24 mg/dL   Calcium 8.6 (L) 8.9 - 10.3 mg/dL   Total Protein 8.1 6.5 - 8.1 g/dL   Albumin 3.5 3.5 - 5.0 g/dL   AST 51 (H) 15 - 41 U/L   ALT 42 0 - 44 U/L   Alkaline Phosphatase 71 38 - 126 U/L   Total Bilirubin 0.8 0.3 - 1.2 mg/dL   GFR calc non Af Amer 22 (L) >60 mL/min   GFR calc Af Amer 26 (L) >60 mL/min   Anion gap 15 5 - 15    Comment: Performed at [facility], [address] [place], [place] [address]  CK     Status: None   Collection Time: 03/15/19 11:15 PM  Result Value Ref Range   Total CK 117 49 - 397 U/L    Comment: Performed at [facility], [address] [place], [place] [address]  Lactic acid, plasma     Status: Abnormal   Collection Time: 03/15/19 11:15 PM  Result Value Ref Range   Lactic Acid, Venous 2.6 (HH) 0.5 - 1.9 mmol/L    Comment: CRITICAL RESULT CALLED TO, READ BACK BY AND VERIFIED WITH: WATLINGTON,K @ 0011 ON 03/16/19 BY JUW Performed at [facility], [address] [place], [place] [address]   Magnesium     Status: None   Collection Time: 03/15/19 11:15 PM  Result Value Ref Range   Magnesium 2.1 1.7 - 2.4 mg/dL    Comment: Performed at [facility], [address], [place], [place] [address]  Troponin I (High Sensitivity)     Status: None   Collection Time: 03/15/19 11:15 PM  Result Value Ref Range   Troponin I (High Sensitivity) 9 <18 ng/L    Comment: (NOTE) Elevated high sensitivity  troponin I (hsTnI) values and significant  changes across serial measurements may suggest ACS but many other  chronic and acute conditions are known to elevate hsTnI results.  Refer to the "Links" section for chest pain algorithms and additional  guidance. Performed at [facility]  [facility], [address] [place], [place] [address]   Brain natriuretic peptide     Status: None   Collection Time: 03/15/19 11:15 PM  Result Value Ref Range   B Natriuretic Peptide 40.0 0.0 - 100.0 pg/mL    Comment: Performed at [facility], [address] [place], [place] [address]  Lactic acid, plasma     Status: Abnormal   Collection Time: 03/16/19 12:39 AM  Result Value Ref Range   Lactic Acid, Venous 2.3 (HH) 0.5 - 1.9 mmol/L    Comment: CRITICAL RESULT CALLED TO, READ BACK BY AND VERIFIED WITH: WATLINGTON,K @ 0115 ON 03/16/19 BY JUW Performed at [facility], [address], [place], [place] [address]   Troponin I (High Sensitivity)     Status: None   Collection Time: 03/16/19 12:39 AM  Result Value Ref Range   Troponin I (High Sensitivity) 8 <18 ng/L    Comment: (NOTE) Elevated high sensitivity troponin I (hsTnI) values and significant  changes across serial measurements may suggest ACS but many other  chronic and acute conditions are known to elevate hsTnI results.  Refer to the "Links" section for chest pain algorithms and additional  guidance. Performed at [facility], [address] [place], [place] [address]     Chemistries  Recent Labs  Lab 03/09/19 [phone] 03/09/19 1131 03/09/19 1817 03/10/19 0510 03/15/19 2315  NA 129* 130* 131* 134* 133*  K 2.8* 3.0* 3.9 3.3* 3.2*  CL 89* 90* 91* 94* 91*  CO2 '24 24 25 27 27  ' GLUCOSE 177* 214* 148* 140* 140*  BUN 25* 22* 20 18 52*  CREATININE 1.27* 1.19 1.02 1.06 3.14*  CALCIUM 7.1* 7.4* 7.9* 7.9* 8.6*  MG  --  2.1  --   --  2.1  AST 36  --   --   --  51*  ALT 26  --   --   --  42  ALKPHOS 68  --   --   --  71  BILITOT 1.2  --   --   --  0.8    ------------------------------------------------------------------------------------------------------------------  ------------------------------------------------------------------------------------------------------------------ GFR: Estimated Creatinine Clearance: 54.8 mL/min (A) (by C-G formula based on SCr of 3.14 mg/dL (H)). Liver Function Tests: Recent Labs  Lab 03/09/19 0339 03/15/19 2315  AST 36 51*  ALT 26 42  ALKPHOS 68 71  BILITOT 1.2 0.8  PROT 7.3 8.1  ALBUMIN 2.8* 3.5   No results for input(s): LIPASE, AMYLASE in the last 168 hours. No results for input(s): AMMONIA in the last 168 hours. Coagulation Profile: No results for input(s): INR, PROTIME in the last 168 hours. Cardiac Enzymes: Recent Labs  Lab 03/15/19 2315  CKTOTAL 117   BNP (last 3 results) No results for input(s): PROBNP in the last 8760 hours. HbA1C: No results for input(s): HGBA1C in the last 72 hours. CBG: Recent Labs  Lab 03/09/19 0615 03/10/19 0616  GLUCAP 168* 132*   Lipid Profile: No results for input(s): CHOL, HDL, LDLCALC, TRIG, CHOLHDL, LDLDIRECT in the last 72 hours. Thyroid Function Tests: No results for input(s): TSH, T4TOTAL, FREET4, T3FREE, THYROIDAB in the last 72 hours. Anemia Panel: No results for input(s): VITAMINB12, FOLATE, FERRITIN, TIBC, IRON, RETICCTPCT in the last 72 hours.  --------------------------------------------------------------------------------------------------------------- Urine analysis:    Component Value Date/Time   COLORURINE AMBER (A) 03/08/2019 1310   APPEARANCEUR HAZY (A) 03/08/2019 1310   LABSPEC 1.018 03/08/2019 1310   PHURINE 5.0 03/08/2019 1310   GLUCOSEU NEGATIVE 03/08/2019 1310   [place] 03/08/2019  [place] 03/08/2019 1310   BILIRUBINUR small (A) 03/07/2019 1141   BILIRUBINUR neg 07/18/2018 0855   KETONESUR NEGATIVE 03/08/2019 1310   PROTEINUR 100 (A) 03/08/2019 1310   UROBILINOGEN 2.0 (H) 03/08/2019 1123    NITRITE NEGATIVE 03/08/2019 1310   LEUKOCYTESUR MODERATE (A) 03/08/2019 1310      Imaging Results:    Dg Chest Portable 1 View  Result Date: 03/16/2019 CLINICAL DATA:  48 year old male with weakness. EXAM: PORTABLE CHEST 1 VIEW COMPARISON:  Chest radiograph dated 03/08/2019 FINDINGS: There is no focal consolidation, pleural effusion, or pneumothorax. Stable cardiomegaly. Median sternotomy wires and left pectoral pacemaker device. No acute osseous pathology. IMPRESSION: 1. No acute cardiopulmonary process. 2. Stable cardiomegaly. Electronically Signed   By: Anner Crete M.D.   On: 03/16/2019 00:06       Assessment & Plan:    Principal Problem:   ARF (acute renal failure) (HCC) Active Problems:   Essential hypertension   Sepsis ([place])   Abnormal liver function   History of pulmonary embolism  ARF Check urine sodium, urine prot/creatinine, urine eosinophilsl Check abdominal ultrasound STOP Lisinopril STOP Metolazone DC Cipro Hydrate with ns iv at 39m per hour x 10 hours Check cmp in am  Sepsis (hypotension, leukocytosis, elevated lactic acid) Blood culture x2 Urinalysis pending Cefepime iv pharmacy to dose Check cbc in am  Hypotension Check trop I Check cortisol Check cardiac echo  Hypertension STOP lisinopril as above Cont Metoprolol 1035mpo bid  Pafib, h/o AS s/p AVR Cont Metoprolol as above Cont Xarelto pharmacy to dose  H/o GOUT Decrease Allopurinol to 10051mo qday temporarily  Constipation Cont Colace  Anxiety Cont Celexa 33m51m qday Cont Buspar 10mg72mtid  H/o Asthma/ COPD Cont Albuterol HFA 2puff q6h prn Cont Dulera 2puff bid  DVT Prophylaxis-   Xarelto- SCDs   AM Labs Ordered, also please review Full Orders  Family Communication: Admission, patients condition and plan of care including tests being ordered have been discussed with the patient who indicate understanding and agree with the plan and Code Status.  Code Status:  FULL  CODE per patient   Admission status:  Inpatient: Based on patients clinical presentation and evaluation of above clinical data, I have made determination that patient meets Inpatient criteria at this time.   Time spent in minutes : 70 minutes,     JamesJani Gravelon 03/16/2019 at 1:38 AM

## 2019-03-16 NOTE — ED Notes (Signed)
CRITICAL VALUE ALERT  Critical Value: Lactic Acid 2.3  Date & Time Notied: 03/16/19 0115  Provider Notified: Dr. Wyvonnia Dusky  Orders Received/Actions taken: n/a

## 2019-03-16 NOTE — Plan of Care (Signed)

## 2019-03-17 ENCOUNTER — Ambulatory Visit: Payer: Self-pay | Admitting: Physician Assistant

## 2019-03-17 LAB — BASIC METABOLIC PANEL
Anion gap: 12 (ref 5–15)
BUN: 35 mg/dL — ABNORMAL HIGH (ref 6–20)
CO2: 26 mmol/L (ref 22–32)
Calcium: 8.9 mg/dL (ref 8.9–10.3)
Chloride: 97 mmol/L — ABNORMAL LOW (ref 98–111)
Creatinine, Ser: 1.15 mg/dL (ref 0.61–1.24)
GFR calc Af Amer: >60 mL/min (ref >60)
GFR calc non Af Amer: >60 mL/min (ref >60)
Glucose, Bld: 157 mg/dL — ABNORMAL HIGH (ref 70–99)
Potassium: 3.4 mmol/L — ABNORMAL LOW (ref 3.5–5.1)
Sodium: 135 mmol/L (ref 135–145)

## 2019-03-17 LAB — GLUCOSE, CAPILLARY: Glucose-Capillary: 114 mg/dL — ABNORMAL HIGH (ref 70–99)

## 2019-03-17 LAB — CBC
HCT: 40.1 % (ref 39.0–52.0)
Hemoglobin: 12.5 g/dL — ABNORMAL LOW (ref 13.0–17.0)
MCH: 25.9 pg — ABNORMAL LOW (ref 26.0–34.0)
MCHC: 31.2 g/dL (ref 30.0–36.0)
MCV: 83 fL (ref 80.0–100.0)
Platelets: 282 10*3/uL (ref 150–400)
RBC: 4.83 MIL/uL (ref 4.22–5.81)
RDW: 17.2 % — ABNORMAL HIGH (ref 11.5–15.5)
WBC: 9.7 10*3/uL (ref 4.0–10.5)
nRBC: 0 % (ref 0.0–0.2)

## 2019-03-17 LAB — MAGNESIUM: Magnesium: 2.3 mg/dL (ref 1.7–2.4)

## 2019-03-17 LAB — URINE CULTURE: Culture: NO GROWTH

## 2019-03-17 LAB — LACTIC ACID, PLASMA: Lactic Acid, Venous: 1.8 mmol/L (ref 0.5–1.9)

## 2019-03-17 MED ORDER — FUROSEMIDE 80 MG PO TABS
80.0000 mg | ORAL_TABLET | Freq: Two times a day (BID) | ORAL | Status: DC
  Administered 2019-03-17: 80 mg via ORAL
  Filled 2019-03-17: qty 1

## 2019-03-17 MED ORDER — RIVAROXABAN 20 MG PO TABS: 20.0000 mg | ORAL_TABLET | Freq: Every day | ORAL | Status: DC

## 2019-03-17 MED ORDER — METOLAZONE 5 MG PO TABS
5.0000 mg | ORAL_TABLET | ORAL | Status: DC
  Administered 2019-03-17: 5 mg via ORAL
  Filled 2019-03-17: qty 1

## 2019-03-17 MED ORDER — POTASSIUM CHLORIDE CRYS ER 20 MEQ PO TBCR
40.0000 meq | EXTENDED_RELEASE_TABLET | Freq: Once | ORAL | Status: AC
  Administered 2019-03-17: 40 meq via ORAL

## 2019-03-17 NOTE — Discharge Summary (Signed)
Physician Discharge Summary  Reginald [name] [id number] DOB: Dec 27, 1970 DOA: 03/15/2019  PCP: [name], PA-C  Admit date: 03/15/2019  Discharge date: 03/17/2019  Admitted From:Home  Disposition:  Home  Recommendations for Outpatient Follow-up:  1. Follow up with PCP in 1-2 weeks 2. Please obtain BMP in one week 3. Remain on home medications as prior Reginald complete home course of antibiotic treatment 4. Discontinue lisinopril at this time Reginald monitor blood pressures closely at home 5. Discussed reducing fluid intake at home as well  Home Health: None  Equipment/Devices: None  Discharge Condition: Stable  CODE STATUS: Full  Diet recommendation: Heart Healthy/carb modified-1500 mL fluid restriction  Brief/Interim Summary: Per HPI: Reginald a48 [name],Reginald [name], Reginald [name], Reginald (diastolic), Reginald [name], Reginald [name], Reginald [name], Reginald [name], Reginald [name], Reginald [name], Reginald sepsis secondary to pyelonephritis 03/08/2019, presents with c/o dizziness, andlow bp Reginald body cramp Reginald decrease in urine output, Pt denies NSAID use, or any other Reginald medication change except for cipro.  Pt denies diarrhea.  10/25: Patient was recently discharged on 03/10/2019 after treatment for E. coli pyelonephritis Reginald was discharged with ciprofloxacin.  He was noted to have [name] during his hospitalization which subsequently resolved after holding his medications Reginald he was started back on full dose of all of his medications.  He was noted to have some dizziness at home with low blood pressure readings Reginald body cramps Reginald has been admitted with AKI with concerns for sepsis related to ongoing UTI/pyelonephritis.  He has been started on IV cefepime.  We will plan to give further IV fluid Reginald potassium supplementation today.  Monitor cultures Reginald labs closely.  10/26: Patient is doing well this morning with improved blood pressure readings in the [id number]  systolic.  Creatinine is back to baseline at 1.15.  Cortisol levels were noted to be normal Reginald 2D echocardiogram with LVEF 60-65% Reginald no other acute findings noted.  Blood cultures have been negative x1 day Reginald we had discussed discontinuing IV cefepime at this time Reginald patient is to finish off his course of ciprofloxacin at home for his pansensitive E. coli.  I have discussed with him that he needs to remain on his home medications Reginald monitor blood pressures closely with the exception of lisinopril.  I discussed that lisinopril may be causing his blood pressures to become too soft Reginald this may cause a lot of his symptomatology, but he needs to monitor this closely.  He will need to follow-up with PCP in 1 week Reginald have repeat labs done at that time to ensure stability.  He has no further symptoms at this time Reginald is stable for discharge Reginald is eager to go home.  I do not feel that he has had sepsis physiology after reviewing the chart.  It appears that much of his symptoms were related to his dehydration with [name] Reginald AKI.  Discharge Diagnoses:  Principal Problem:   ARF (acute renal failure) (HCC) Active Problems:   Essential Reginald [name]   Sepsis ([place])   Abnormal liver function   History of Reginald embolism  Principal discharge diagnosis: AKI with [name]/dehydration probably related to aggressive medical management of Reginald [name].  Discharge Instructions  Discharge Instructions    Diet - low sodium heart healthy   Complete by: As directed    Increase activity slowly   Complete by: As directed      Allergies as of 03/17/2019   No Known Allergies     Medication List    STOP taking  these medications   lisinopril 20 MG tablet Commonly known as: ZESTRIL     TAKE these medications   acetaminophen 325 MG tablet Commonly known as: TYLENOL Take 650 mg by mouth every 6 (six) hours as needed for mild pain.   albuterol (2.5 MG/3ML) 0.083% nebulizer solution Commonly  known as: PROVENTIL INHALE 1 VIAL VIA NEBULIZER EVERY 6 HOURS AS NEEDED FOR FOR WHEEZING OR SHORTNESS OF BREATH What changed: See the new instructions.   albuterol 108 (90 Base) MCG/ACT inhaler Commonly known as: VENTOLIN HFA Inhale 2 puffs into the lungs every 6 (six) hours as needed for wheezing or shortness of breath. What changed: Another medication with the same name was changed. Make sure you understand how Reginald when to take each.   allopurinol 300 MG tablet Commonly known as: ZYLOPRIM TAKE 1 Tablet BY MOUTH ONCE DAILY   busPIRone 10 MG tablet Commonly known as: BUSPAR Take 10 mg by mouth 3 (three) times daily.   ciprofloxacin 500 MG tablet Commonly known as: CIPRO Take 1 tablet (500 mg total) by mouth every 12 (twelve) hours for 7 days.   citalopram 40 MG tablet Commonly known as: CELEXA Take 40 mg by mouth daily.   docusate sodium 100 MG capsule Commonly known as: COLACE Take 200 mg by mouth daily as needed for mild constipation.   Dulera 200-5 MCG/ACT Aero Generic drug: mometasone-formoterol INHALE 2 PUFFS BY MOUTH TWICE DAILY. RINSE MOUTH AFTER EACH USE What changed: See the new instructions.   furosemide 80 MG tablet Commonly known as: Lasix Take 1 tablet (80 mg total) by mouth 2 (two) times daily.   Krill Oil 350 MG Caps Take 1 capsule by mouth daily.   loratadine 10 MG tablet Commonly known as: CLARITIN Take 10 mg by mouth daily as needed for allergies.   metolazone 5 MG tablet Commonly known as: ZAROXOLYN Take 1 tablet (5 mg total) by mouth every Monday, Wednesday, Reginald Friday.   metoprolol tartrate 100 MG tablet Commonly known as: LOPRESSOR TAKE 1 Tablet  BY MOUTH TWICE DAILY   potassium chloride 10 MEQ tablet Commonly known as: KLOR-CON Take 1 tablet (10 mEq total) by mouth daily. Take 3 Tablets on Monday, Wednesday, Friday What changed:   how much to take  when to take this  additional instructions   Xarelto 20 MG Tabs tablet Generic  drug: rivaroxaban TAKE 1 Tablet BY MOUTH ONCE DAILY WITH SUPPER What changed: See the new instructions.      Follow-up Information    [name], PA-C Follow up in 1 week(s).   Specialty: Physician Assistant Contact information: [address] [place] [address] [phone]        Herminio Commons, MD .   Specialty: Cardiology Contact information: [place] [address] [phone]          No Known Allergies  Consultations:  None   Procedures/Studies: Dg Chest 1 View  Result Date: 03/08/2019 CLINICAL DATA:  Infection workup. Palate pain Reginald bleeding. Dyspnea. EXAM: CHEST  1 VIEW COMPARISON:  May 06, 2018 FINDINGS: Stable cardiomegaly. Stable pacemaker. No pneumothorax. The lungs are clear. No other acute abnormalities. IMPRESSION: No acute abnormality. Electronically Signed   By: Dorise Bullion III M.D   On: 03/08/2019 14:31   US Abdomen Complete  Result Date: 03/16/2019 CLINICAL DATA:  Abnormal LFTs. EXAM: ABDOMEN ULTRASOUND COMPLETE COMPARISON:  CT 03/08/2019 FINDINGS: Markedly diminished exam detail secondary to body habitus Reginald bowel gas. Gallbladder: Not visualized Common  bile duct: Diameter: Not visualized Liver: Increased parenchymal echogenicity consistent with hepatic steatosis. Left lobe not visualized. IVC: Not visualized Pancreas: Not visualized Spleen: Not visualized Right Kidney: Not visualized. Left Kidney: Not visualized Abdominal aorta: Not visualized Other findings: None. IMPRESSION: 1. Significantly diminished exam detail secondary to body habitus Reginald overlying bowel gas. 2. Increased liver echogenicity compatible with hepatic steatosis. Electronically Signed   By: Kerby Moors M.D.   On: 03/16/2019 11:24   Ct Abdomen Pelvis W Contrast  Result Date: 03/08/2019 CLINICAL DATA:  48 year old male with history of bilateral CVAT. EXAM: CT ABDOMEN Reginald PELVIS WITH CONTRAST TECHNIQUE: Multidetector CT imaging of the  abdomen Reginald pelvis was performed using the standard protocol following bolus administration of intravenous contrast. CONTRAST:  156mL OMNIPAQUE IOHEXOL 300 MG/ML  SOLN COMPARISON:  No priors. FINDINGS: Lower chest: Pacemaker leads in the right atrium Reginald right ventricle. Hepatobiliary: Severe diffuse low attenuation throughout the hepatic parenchyma, indicative of severe hepatic steatosis. No discrete cystic or solid hepatic lesions. No intra or extrahepatic biliary ductal dilatation. Gallbladder is normal in appearance. Pancreas: No pancreatic mass. No pancreatic ductal dilatation. No pancreatic or peripancreatic fluid collections or inflammatory changes. Spleen: Spleen is enlarged measuring 19.2 x 7.3 x 11.9 cm (estimated splenic volume of 834 mL). Adrenals/Urinary Tract: Bilateral kidneys Reginald right adrenal gland are normal in appearance. 1.8 x 1.2 cm fatty attenuation lesion in the left adrenal gland (axial image 36 of series 3), compatible with a benign myelolipoma. No hydroureteronephrosis. Urinary bladder is normal in appearance. Stomach/Bowel: Normal appearance of the stomach. No pathologic dilatation of small bowel or colon. Normal appendix. Vascular/Lymphatic: Reginald atherosclerosis, without evidence of aneurysm or dissection in the abdominal or pelvic vasculature. No lymphadenopathy noted in the abdomen or pelvis. Reproductive: Prostate gland Reginald seminal vesicles are unremarkable in appearance. Other: No significant volume of ascites.  No pneumoperitoneum. Musculoskeletal: There are no aggressive appearing lytic or blastic lesions noted in the visualized portions of the skeleton. IMPRESSION: 1. No acute findings are noted in the abdomen or pelvis to account for the patient's symptoms. 2. Severe hepatic steatosis. 3. Splenomegaly. 4. Small left adrenal myelolipoma. 5. Reginald atherosclerosis. Electronically Signed   By: Vinnie Langton M.D.   On: 03/08/2019 15:47   Dg Chest Portable 1 View  Result Date:  03/16/2019 CLINICAL DATA:  48 year old male with weakness. EXAM: PORTABLE CHEST 1 VIEW COMPARISON:  Chest radiograph dated 03/08/2019 FINDINGS: There is no focal consolidation, pleural effusion, or pneumothorax. Stable cardiomegaly. Median sternotomy wires Reginald left pectoral pacemaker device. No acute osseous pathology. IMPRESSION: 1. No acute cardiopulmonary process. 2. Stable cardiomegaly. Electronically Signed   By: Anner Crete M.D.   On: 03/16/2019 00:06     Discharge Exam: Vitals:   03/17/19 0910 03/17/19 0949  BP: 119/75 138/79  Pulse: 75 78  Resp: 20   Temp: 98.2 F (36.8 C)   SpO2: 98%    Vitals:   03/17/19 0506 03/17/19 0749 03/17/19 0910 03/17/19 0949  BP: (!) 150/92  119/75 138/79  Pulse: 75  75 78  Resp: 20  20   Temp: 98 F (36.7 C)  98.2 F (36.8 C)   TempSrc: Oral  Oral   SpO2: 99% 98% 98%   Weight: (!) 214.3 kg     Height:        General: Pt is alert, awake, not in acute distress, morbid obesity Cardiovascular: RRR, S1/S2 +, no rubs, no gallops Respiratory: CTA bilaterally, no wheezing, no rhonchi Abdominal: Soft, NT, ND,  bowel sounds + Extremities: no edema, no cyanosis    The results of significant diagnostics from this hospitalization (including imaging, microbiology, ancillary Reginald laboratory) are listed below for reference.     Microbiology: Reginald Results (from the past 240 hour(s))  Novel Coronavirus, NAA ([facility])     Status: None   Collection Time: 03/07/19 11:42 AM   Specimen: Oropharyngeal swab; Nasopharyngeal(NP) swabs in vial transport medium   NASOPHARYNGE  Result Value Ref Range Status   SARS-CoV-2, NAA Not Detected Not Detected Final    Comment: This nucleic acid amplification test was developed Reginald its performance characteristics determined by [employer]. Nucleic acid amplification tests include PCR Reginald TMA. This test has not been FDA cleared or approved. This test has been authorized by FDA under an Emergency Use  Authorization (EUA). This test is only authorized for the duration of time the declaration that circumstances exist justifying the authorization of the emergency use of in vitro diagnostic tests for detection of SARS-CoV-2 virus Reginald/or diagnosis of COVID-19 infection under section 564(b)(1) of the Act, 21 U.S.C. [id number]) (1), unless the authorization is terminated or revoked sooner. When diagnostic testing is negative, the possibility of a false negative result should be considered in the context of a patient's Reginald exposures Reginald the presence of clinical signs Reginald symptoms consistent with COVID-19. An individual without symptoms of COVID-19 Reginald who is not shedding SARS-CoV-2 virus would  expect to have a negative (not detected) result in this assay.   Urine Culture     Status: Abnormal   Collection Time: 03/07/19 12:07 PM   Specimen: Urine, Random  Result Value Ref Range Status   Specimen Description   Final    URINE, RANDOM Performed at [facility] [address], [place], [place] [address]    Special Requests   Final    NONE Performed at [facility], [address], [place], [place] [address]    Culture >=100,000 COLONIES/mL ESCHERICHIA COLI (A)  Final   Report Status 03/09/2019 FINAL  Final   Organism ID, Bacteria ESCHERICHIA COLI (A)  Final      Susceptibility   Escherichia coli - MIC*    AMPICILLIN <=2 SENSITIVE Sensitive     CEFAZOLIN <=4 SENSITIVE Sensitive     CEFTRIAXONE <=1 SENSITIVE Sensitive     CIPROFLOXACIN <=0.25 SENSITIVE Sensitive     GENTAMICIN <=1 SENSITIVE Sensitive     IMIPENEM <=0.25 SENSITIVE Sensitive     NITROFURANTOIN <=16 SENSITIVE Sensitive     TRIMETH/SULFA <=20 SENSITIVE Sensitive     AMPICILLIN/SULBACTAM <=2 SENSITIVE Sensitive     PIP/TAZO <=4 SENSITIVE Sensitive     Extended ESBL NEGATIVE Sensitive     * >=100,000 COLONIES/mL ESCHERICHIA COLI  Culture, group A strep (throat)     Status: None   Collection Time: 03/08/19 11:25  AM   Specimen: Throat  Result Value Ref Range Status   Specimen Description THROAT  Final   Special Requests NONE  Final   Culture   Final    NO GROUP A STREP (S.PYOGENES) ISOLATED Performed at [facility] [address] [place], [place] [address]    Report Status 03/11/2019 FINAL  Final  Blood culture (routine x 2)     Status: None   Collection Time: 03/08/19  1:10 PM   Specimen: BLOOD  Result Value Ref Range Status   Specimen Description BLOOD LEFT ANTECUBITAL  Final   Special Requests   Final    BOTTLES DRAWN AEROBIC  Reginald ANAEROBIC Blood Culture adequate volume   Culture   Final    NO GROWTH 5 DAYS Performed at [facility], [place] [address], [place], [place] [address]    Report Status 03/13/2019 FINAL  Final  Urine culture     Status: None   Collection Time: 03/08/19  1:10 PM   Specimen: Urine, Random  Result Value Ref Range Status   Specimen Description URINE, RANDOM  Final   Special Requests NONE  Final   Culture   Final    NO GROWTH Performed at [facility], [place] [address] [place], [place] [address]    Report Status 03/09/2019 FINAL  Final  Blood culture (routine x 2)     Status: None   Collection Time: 03/08/19  1:45 PM   Specimen: BLOOD LEFT HAND  Result Value Ref Range Status   Specimen Description BLOOD LEFT HAND  Final   Special Requests   Final    BOTTLES DRAWN AEROBIC Reginald ANAEROBIC Blood Culture adequate volume   Culture   Final    NO GROWTH 5 DAYS Performed at [facility], [place] [address], [place], [place] [address]    Report Status 03/13/2019 FINAL  Final  SARS CORONAVIRUS 2 (TAT 6-24 HRS) Nasopharyngeal Nasopharyngeal Swab     Status: None   Collection Time: 03/08/19  2:30 PM   Specimen: Nasopharyngeal Swab  Result Value Ref Range Status   SARS Coronavirus 2 NEGATIVE NEGATIVE Final    Comment: (NOTE) SARS-CoV-2 target nucleic acids are NOT DETECTED. The SARS-CoV-2 RNA is generally detectable in upper Reginald lower respiratory  specimens during the acute phase of infection. Negative results do not preclude SARS-CoV-2 infection, do not rule out co-infections with other pathogens, Reginald should not be used as the sole basis for treatment or other patient management decisions. Negative results must be combined with clinical observations, patient history, Reginald epidemiological information. The expected result is Negative. Fact Sheet for Patients: SugarRoll.be Fact Sheet for Healthcare Providers: https://www.woods-mathews.com/ This test is not yet approved or cleared by the [place] FDA Reginald  has been authorized for detection Reginald/or diagnosis of SARS-CoV-2 by FDA under an Emergency Use Authorization (EUA). This EUA will remain  in effect (meaning this test can be used) for the duration of the COVID-19 declaration under Section 56 4(b)(1) of the Act, 21 U.S.C. section 360bbb-3(b)(1), unless the authorization is terminated or revoked sooner. Performed at [facility], [place] [address], [place], [place] [address]   SARS CORONAVIRUS 2 (TAT 6-24 HRS) Nasopharyngeal Nasopharyngeal Swab     Status: None   Collection Time: 03/16/19  1:13 AM   Specimen: Nasopharyngeal Swab  Result Value Ref Range Status   SARS Coronavirus 2 NEGATIVE NEGATIVE Final    Comment: (NOTE) SARS-CoV-2 target nucleic acids are NOT DETECTED. The SARS-CoV-2 RNA is generally detectable in upper Reginald lower respiratory specimens during the acute phase of infection. Negative results do not preclude SARS-CoV-2 infection, do not rule out co-infections with other pathogens, Reginald should not be used as the sole basis for treatment or other patient management decisions. Negative results must be combined with clinical observations, patient history, Reginald epidemiological information. The expected result is Negative. Fact Sheet for Patients: SugarRoll.be Fact Sheet for Healthcare  Providers: https://www.woods-mathews.com/ This test is not yet approved or cleared by the [place] FDA Reginald  has been authorized for detection Reginald/or diagnosis of SARS-CoV-2 by FDA under an Emergency Use Authorization (EUA). This EUA will  remain  in effect (meaning this test can be used) for the duration of the COVID-19 declaration under Section 56 4(b)(1) of the Act, 21 U.S.C. section 360bbb-3(b)(1), unless the authorization is terminated or revoked sooner. Performed at [facility], [place] [address] [place], [place] [address]   Urine Culture     Status: None   Collection Time: 03/16/19  2:30 AM   Specimen: Urine, Clean Catch  Result Value Ref Range Status   Specimen Description   Final    URINE, CLEAN CATCH Performed at [facility], [address], [place], [place] [address]    Special Requests   Final    NONE Performed at [facility], [address] [place], [place] [address]    Culture   Final    NO GROWTH Performed at [facility], [place] [address] [place], [place] [address]    Report Status 03/17/2019 FINAL  Final  Blood culture (routine x 2)     Status: None (Preliminary result)   Collection Time: 03/16/19  7:22 AM   Specimen: BLOOD RIGHT HAND  Result Value Ref Range Status   Specimen Description   Final    BLOOD RIGHT HAND BOTTLES DRAWN AEROBIC Reginald ANAEROBIC   Special Requests Blood Culture adequate volume  Final   Culture   Final    NO GROWTH 1 DAY Performed at [facility], [address] [place], [place] [address]    Report Status PENDING  Incomplete  Blood culture (routine x 2)     Status: None (Preliminary result)   Collection Time: 03/16/19  7:22 AM   Specimen: BLOOD RIGHT HAND  Result Value Ref Range Status   Specimen Description   Final    BLOOD RIGHT HAND BOTTLES DRAWN AEROBIC Reginald ANAEROBIC   Special Requests Blood Culture adequate volume  Final   Culture   Final    NO GROWTH 1 DAY Performed at [facility], [address] [place], [place] [address]    Report Status PENDING  Incomplete     Labs: BNP (last 3 results) Reginald Labs    05/06/18 0458 03/08/19 1310 03/15/19 2315  BNP 113.0* 189.1* [id number]   Basic Metabolic Panel: [facility]d [facility]  Lab 03/15/19 2315 03/16/19 0527 03/17/19 0648  NA 133* 134* 135  K 3.2* 3.0* 3.4*  CL 91* 93* 97*  CO2 27 26 26   GLUCOSE 140* 136* 157*  BUN 52* 54* 35*  CREATININE 3.14* 2.49* 1.15  CALCIUM 8.6* 8.4* 8.9  MG 2.1  --  2.3   Liver Function Tests: Reginald [facility]  Lab 03/15/19 2315 03/16/19 0527  AST 51* 44*  ALT 42 37  ALKPHOS 71 66  BILITOT 0.8 0.8  PROT 8.1 7.5  ALBUMIN 3.5 3.1*   No results for input(s): LIPASE, AMYLASE in the last 168 hours. No results for input(s): AMMONIA in the last 168 hours. CBC: [facility]  Lab 03/15/19 2315 03/16/19 0527 03/17/19 0648  WBC 20.2* 15.5* 9.7  NEUTROABS 15.7*  --   --   HGB 12.9* 11.9* 12.5*  HCT 40.6 38.7* 40.1  MCV 82.7 82.9 83.0  PLT 366 317 282   Cardiac Enzymes: [facility]  Lab 03/15/19 2315  CKTOTAL 117   BNP: Invalid input(s): POCBNP CBG: [facility]  Lab 03/17/19 0815  GLUCAP 114*   D-Dimer No results for input(s): DDIMER in the last 72 hours. Hgb A1c No results for input(s): HGBA1C in the last 72 hours. Lipid Profile No results for  input(s): CHOL, HDL, LDLCALC, TRIG, CHOLHDL, LDLDIRECT in the last 72 hours. Thyroid function studies No results for input(s): TSH, T4TOTAL, T3FREE, THYROIDAB in the last 72 hours.  Invalid input(s): FREET3 Anemia work up No results for input(s): VITAMINB12, FOLATE, FERRITIN, TIBC, IRON, RETICCTPCT in the last 72 hours. Urinalysis    Component Value Date/Time   COLORURINE YELLOW 03/16/2019 0230   APPEARANCEUR HAZY (A) 03/16/2019 0230   LABSPEC 1.016 03/16/2019 0230   PHURINE 5.0 03/16/2019 0230   GLUCOSEU NEGATIVE 03/16/2019 0230   HGBUR NEGATIVE 03/16/2019 0230   BILIRUBINUR NEGATIVE 03/16/2019 0230   BILIRUBINUR small (A) 03/07/2019 1141    BILIRUBINUR neg 07/18/2018 0855   KETONESUR NEGATIVE 03/16/2019 0230   PROTEINUR 30 (A) 03/16/2019 0230   UROBILINOGEN 2.0 (H) 03/08/2019 1123   NITRITE NEGATIVE 03/16/2019 0230   LEUKOCYTESUR NEGATIVE 03/16/2019 0230   Sepsis Labs Invalid input(s): PROCALCITONIN,  WBC,  LACTICIDVEN Microbiology Reginald Results (from the past 240 hour(s))  Novel Coronavirus, NAA ([facility])     Status: None   Collection Time: 03/07/19 11:42 AM   Specimen: Oropharyngeal swab; Nasopharyngeal(NP) swabs in vial transport medium   NASOPHARYNGE  Result Value Ref Range Status   SARS-CoV-2, NAA Not Detected Not Detected Final    Comment: This nucleic acid amplification test was developed Reginald its performance characteristics determined by [employer]. Nucleic acid amplification tests include PCR Reginald TMA. This test has not been FDA cleared or approved. This test has been authorized by FDA under an Emergency Use Authorization (EUA). This test is only authorized for the duration of time the declaration that circumstances exist justifying the authorization of the emergency use of in vitro diagnostic tests for detection of SARS-CoV-2 virus Reginald/or diagnosis of COVID-19 infection under section 564(b)(1) of the Act, 21 U.S.C. [id number]) (1), unless the authorization is terminated or revoked sooner. When diagnostic testing is negative, the possibility of a false negative result should be considered in the context of a patient's Reginald exposures Reginald the presence of clinical signs Reginald symptoms consistent with COVID-19. An individual without symptoms of COVID-19 Reginald who is not shedding SARS-CoV-2 virus would  expect to have a negative (not detected) result in this assay.   Urine Culture     Status: Abnormal   Collection Time: 03/07/19 12:07 PM   Specimen: Urine, Random  Result Value Ref Range Status   Specimen Description   Final    URINE, RANDOM Performed at [facility] [address],  [place], [place] [address]    Special Requests   Final    NONE Performed at [facility], [address], [place], [place] [address]    Culture >=100,000 COLONIES/mL ESCHERICHIA COLI (A)  Final   Report Status 03/09/2019 FINAL  Final   Organism ID, Bacteria ESCHERICHIA COLI (A)  Final      Susceptibility   Escherichia coli - MIC*    AMPICILLIN <=2 SENSITIVE Sensitive     CEFAZOLIN <=4 SENSITIVE Sensitive     CEFTRIAXONE <=1 SENSITIVE Sensitive     CIPROFLOXACIN <=0.25 SENSITIVE Sensitive     GENTAMICIN <=1 SENSITIVE Sensitive     IMIPENEM <=0.25 SENSITIVE Sensitive     NITROFURANTOIN <=16 SENSITIVE Sensitive     TRIMETH/SULFA <=20 SENSITIVE Sensitive     AMPICILLIN/SULBACTAM <=2 SENSITIVE Sensitive     PIP/TAZO <=4 SENSITIVE Sensitive     Extended ESBL NEGATIVE Sensitive     * >=100,000 COLONIES/mL ESCHERICHIA COLI  Culture, group A strep (throat)  Status: None   Collection Time: 03/08/19 11:25 AM   Specimen: Throat  Result Value Ref Range Status   Specimen Description THROAT  Final   Special Requests NONE  Final   Culture   Final    NO GROUP A STREP (S.PYOGENES) ISOLATED Performed at [facility], [place] [address], [place], [place] [address]    Report Status 03/11/2019 FINAL  Final  Blood culture (routine x 2)     Status: None   Collection Time: 03/08/19  1:10 PM   Specimen: BLOOD  Result Value Ref Range Status   Specimen Description BLOOD LEFT ANTECUBITAL  Final   Special Requests   Final    BOTTLES DRAWN AEROBIC Reginald ANAEROBIC Blood Culture adequate volume   Culture   Final    NO GROWTH 5 DAYS Performed at [facility], [place] [address] [place], [place] [address]    Report Status 03/13/2019 FINAL  Final  Urine culture     Status: None   Collection Time: 03/08/19  1:10 PM   Specimen: Urine, Random  Result Value Ref Range Status   Specimen Description URINE, RANDOM  Final   Special Requests NONE  Final   Culture   Final    NO GROWTH Performed at [facility], [place] [address] [place], [place] [address]    Report Status 03/09/2019 FINAL  Final  Blood culture (routine x 2)     Status: None   Collection Time: 03/08/19  1:45 PM   Specimen: BLOOD LEFT HAND  Result Value Ref Range Status   Specimen Description BLOOD LEFT HAND  Final   Special Requests   Final    BOTTLES DRAWN AEROBIC Reginald ANAEROBIC Blood Culture adequate volume   Culture   Final    NO GROWTH 5 DAYS Performed at [facility], [place] [address], [place], [place] [address]    Report Status 03/13/2019 FINAL  Final  SARS CORONAVIRUS 2 (TAT 6-24 HRS) Nasopharyngeal Nasopharyngeal Swab     Status: None   Collection Time: 03/08/19  2:30 PM   Specimen: Nasopharyngeal Swab  Result Value Ref Range Status   SARS Coronavirus 2 NEGATIVE NEGATIVE Final    Comment: (NOTE) SARS-CoV-2 target nucleic acids are NOT DETECTED. The SARS-CoV-2 RNA is generally detectable in upper Reginald lower respiratory specimens during the acute phase of infection. Negative results do not preclude SARS-CoV-2 infection, do not rule out co-infections with other pathogens, Reginald should not be used as the sole basis for treatment or other patient management decisions. Negative results must be combined with clinical observations, patient history, Reginald epidemiological information. The expected result is Negative. Fact Sheet for Patients: SugarRoll.be Fact Sheet for Healthcare Providers: https://www.woods-mathews.com/ This test is not yet approved or cleared by the [place] FDA Reginald  has been authorized for detection Reginald/or diagnosis of SARS-CoV-2 by FDA under an Emergency Use Authorization (EUA). This EUA will remain  in effect (meaning this test can be used) for the duration of the COVID-19 declaration under Section 56 4(b)(1) of the Act, 21 U.S.C. section 360bbb-3(b)(1), unless the authorization is terminated or revoked sooner. Performed at [facility], [place] [address] [place], [place] [address]   SARS CORONAVIRUS 2 (TAT 6-24 HRS) Nasopharyngeal Nasopharyngeal Swab     Status: None   Collection Time: 03/16/19  1:13 AM   Specimen: Nasopharyngeal Swab  Result Value Ref Range Status   SARS Coronavirus 2 NEGATIVE NEGATIVE Final  Comment: (NOTE) SARS-CoV-2 target nucleic acids are NOT DETECTED. The SARS-CoV-2 RNA is generally detectable in upper Reginald lower respiratory specimens during the acute phase of infection. Negative results do not preclude SARS-CoV-2 infection, do not rule out co-infections with other pathogens, Reginald should not be used as the sole basis for treatment or other patient management decisions. Negative results must be combined with clinical observations, patient history, Reginald epidemiological information. The expected result is Negative. Fact Sheet for Patients: SugarRoll.be Fact Sheet for Healthcare Providers: https://www.woods-mathews.com/ This test is not yet approved or cleared by the [place] FDA Reginald  has been authorized for detection Reginald/or diagnosis of SARS-CoV-2 by FDA under an Emergency Use Authorization (EUA). This EUA will remain  in effect (meaning this test can be used) for the duration of the COVID-19 declaration under Section 56 4(b)(1) of the Act, 21 U.S.C. section 360bbb-3(b)(1), unless the authorization is terminated or revoked sooner. Performed at [facility], [place] [address], [place], [place] [address]   Urine Culture     Status: None   Collection Time: 03/16/19  2:30 AM   Specimen: Urine, Clean Catch  Result Value Ref Range Status   Specimen Description   Final    URINE, CLEAN CATCH Performed at [facility], [address] [place], [place] [address]    Special Requests   Final    NONE Performed at [facility], [address] [place], [place] [address]    Culture   Final    NO GROWTH Performed at [facility], [place] [address], [place], [place] [address]    Report Status 03/17/2019 FINAL  Final  Blood culture (routine x 2)     Status: None (Preliminary result)   Collection Time: 03/16/19  7:22 AM   Specimen: BLOOD RIGHT HAND  Result Value Ref Range Status   Specimen Description   Final    BLOOD RIGHT HAND BOTTLES DRAWN AEROBIC Reginald ANAEROBIC   Special Requests Blood Culture adequate volume  Final   Culture   Final    NO GROWTH 1 DAY Performed at [facility], [address], [place], [place] [address]    Report Status PENDING  Incomplete  Blood culture (routine x 2)     Status: None (Preliminary result)   Collection Time: 03/16/19  7:22 AM   Specimen: BLOOD RIGHT HAND  Result Value Ref Range Status   Specimen Description   Final    BLOOD RIGHT HAND BOTTLES DRAWN AEROBIC Reginald ANAEROBIC   Special Requests Blood Culture adequate volume  Final   Culture   Final    NO GROWTH 1 DAY Performed at [facility], [address], [place], [place] [address]    Report Status PENDING  Incomplete     Time coordinating discharge: 40 minutes  SIGNED:   Rodena Goldmann, DO [facility] 03/17/2019, 10:19 AM  If 7PM-7AM, please contact night-coverage [email or web address]

## 2019-03-17 NOTE — Progress Notes (Signed)
Nsg Discharge Note  Admit Date:  03/15/2019 Discharge date: 03/17/2019   [name] to be D/C'd home per MD order.  AVS completed.  Copy for chart, and copy for patient signed, and dated. Patient/caregiver able to verbalize understanding.  Discharge Medication: Allergies as of 03/17/2019   No Known Allergies     Medication List    STOP taking these medications   lisinopril 20 MG tablet Commonly known as: ZESTRIL     TAKE these medications   acetaminophen 325 MG tablet Commonly known as: TYLENOL Take 650 mg by mouth every 6 (six) hours as needed for mild pain.   albuterol (2.5 MG/3ML) 0.083% nebulizer solution Commonly known as: PROVENTIL INHALE 1 VIAL VIA NEBULIZER EVERY 6 HOURS AS NEEDED FOR FOR WHEEZING OR SHORTNESS OF BREATH What changed: See the new instructions.   albuterol 108 (90 Base) MCG/ACT inhaler Commonly known as: VENTOLIN HFA Inhale 2 puffs into the lungs every 6 (six) hours as needed for wheezing or shortness of breath. What changed: Another medication with the same name was changed. Make sure you understand how and when to take each.   allopurinol 300 MG tablet Commonly known as: ZYLOPRIM TAKE 1 Tablet BY MOUTH ONCE DAILY   busPIRone 10 MG tablet Commonly known as: BUSPAR Take 10 mg by mouth 3 (three) times daily.   ciprofloxacin 500 MG tablet Commonly known as: CIPRO Take 1 tablet (500 mg total) by mouth every 12 (twelve) hours for 7 days.   citalopram 40 MG tablet Commonly known as: CELEXA Take 40 mg by mouth daily.   docusate sodium 100 MG capsule Commonly known as: COLACE Take 200 mg by mouth daily as needed for mild constipation.   Dulera 200-5 MCG/ACT Aero Generic drug: mometasone-formoterol INHALE 2 PUFFS BY MOUTH TWICE DAILY. RINSE MOUTH AFTER EACH USE What changed: See the new instructions.   furosemide 80 MG tablet Commonly known as: Lasix Take 1 tablet (80 mg total) by mouth 2 (two) times daily.   Krill Oil 350 MG Caps Take  1 capsule by mouth daily.   loratadine 10 MG tablet Commonly known as: CLARITIN Take 10 mg by mouth daily as needed for allergies.   metolazone 5 MG tablet Commonly known as: ZAROXOLYN Take 1 tablet (5 mg total) by mouth every Monday, Wednesday, and Friday.   metoprolol tartrate 100 MG tablet Commonly known as: LOPRESSOR TAKE 1 Tablet  BY MOUTH TWICE DAILY   potassium chloride 10 MEQ tablet Commonly known as: KLOR-CON Take 1 tablet (10 mEq total) by mouth daily. Take 3 Tablets on Monday, Wednesday, Friday What changed:   how much to take  when to take this  additional instructions   Xarelto 20 MG Tabs tablet Generic drug: rivaroxaban TAKE 1 Tablet BY MOUTH ONCE DAILY WITH SUPPER What changed: See the new instructions.       Discharge Assessment: Vitals:   03/17/19 0910 03/17/19 0949  BP: 119/75 138/79  Pulse: 75 78  Resp: 20   Temp: 98.2 F (36.8 C)   SpO2: 98%    Skin clean, dry and intact without evidence of skin break down, no evidence of skin tears noted. IV catheter discontinued intact. Site without signs and symptoms of complications - no redness or edema noted at insertion site, patient denies c/o pain - only slight tenderness at site.  Dressing with slight pressure applied.  D/c Instructions-Education: Discharge instructions given to patient/family with verbalized understanding. D/c education completed with patient/family including follow up instructions, medication  list, d/c activities limitations if indicated, with other d/c instructions as indicated by MD - patient able to verbalize understanding, all questions fully answered. Patient instructed to return to ED, call 911, or call MD for any changes in condition.  Patient escorted via [place], and D/C home via private auto.  Carney Corners, RN 03/17/2019 11:50 AM

## 2019-03-18 ENCOUNTER — Ambulatory Visit: Payer: Self-pay | Admitting: Physician Assistant

## 2019-03-19 ENCOUNTER — Other Ambulatory Visit: Payer: Self-pay

## 2019-03-19 ENCOUNTER — Encounter (HOSPITAL_COMMUNITY): Payer: Self-pay | Admitting: Nurse Practitioner

## 2019-03-19 ENCOUNTER — Ambulatory Visit (INDEPENDENT_AMBULATORY_CARE_PROVIDER_SITE_OTHER): Payer: Self-pay | Admitting: *Deleted

## 2019-03-19 ENCOUNTER — Ambulatory Visit (HOSPITAL_COMMUNITY)
Admission: RE | Admit: 2019-03-19 | Discharge: 2019-03-19 | Disposition: A | Discharge status: Home / Self Care | Payer: Self-pay | Source: Ambulatory Visit | Attending: Physician Assistant | Admitting: Physician Assistant

## 2019-03-19 VITALS — BP 136/84 | HR 70 | Ht 71.0 in | Wt >= 6400 oz

## 2019-03-19 DIAGNOSIS — I480 Paroxysmal atrial fibrillation: Secondary | ICD-10-CM | POA: Insufficient documentation

## 2019-03-19 DIAGNOSIS — F419 Anxiety disorder, unspecified: Secondary | ICD-10-CM | POA: Insufficient documentation

## 2019-03-19 DIAGNOSIS — I442 Atrioventricular block, complete: Secondary | ICD-10-CM | POA: Insufficient documentation

## 2019-03-19 DIAGNOSIS — Z87891 Personal history of nicotine dependence: Secondary | ICD-10-CM | POA: Insufficient documentation

## 2019-03-19 DIAGNOSIS — R7303 Prediabetes: Secondary | ICD-10-CM | POA: Insufficient documentation

## 2019-03-19 DIAGNOSIS — I5032 Chronic diastolic (congestive) heart failure: Secondary | ICD-10-CM

## 2019-03-19 DIAGNOSIS — F329 Major depressive disorder, single episode, unspecified: Secondary | ICD-10-CM | POA: Insufficient documentation

## 2019-03-19 DIAGNOSIS — M109 Gout, unspecified: Secondary | ICD-10-CM | POA: Insufficient documentation

## 2019-03-19 DIAGNOSIS — Z79899 Other long term (current) drug therapy: Secondary | ICD-10-CM | POA: Insufficient documentation

## 2019-03-19 DIAGNOSIS — M1990 Unspecified osteoarthritis, unspecified site: Secondary | ICD-10-CM | POA: Insufficient documentation

## 2019-03-19 DIAGNOSIS — I359 Nonrheumatic aortic valve disorder, unspecified: Secondary | ICD-10-CM | POA: Insufficient documentation

## 2019-03-19 DIAGNOSIS — Z950 Presence of cardiac pacemaker: Secondary | ICD-10-CM | POA: Insufficient documentation

## 2019-03-19 DIAGNOSIS — J449 Chronic obstructive pulmonary disease, unspecified: Secondary | ICD-10-CM | POA: Insufficient documentation

## 2019-03-19 DIAGNOSIS — Z86711 Personal history of pulmonary embolism: Secondary | ICD-10-CM | POA: Insufficient documentation

## 2019-03-19 DIAGNOSIS — Z7901 Long term (current) use of anticoagulants: Secondary | ICD-10-CM | POA: Insufficient documentation

## 2019-03-19 DIAGNOSIS — R011 Cardiac murmur, unspecified: Secondary | ICD-10-CM | POA: Insufficient documentation

## 2019-03-19 DIAGNOSIS — Z6841 Body Mass Index (BMI) 40.0 and over, adult: Secondary | ICD-10-CM | POA: Insufficient documentation

## 2019-03-19 DIAGNOSIS — G4730 Sleep apnea, unspecified: Secondary | ICD-10-CM | POA: Insufficient documentation

## 2019-03-19 LAB — CUP PACEART REMOTE DEVICE CHECK
Battery Remaining Longevity: 128 mo
Battery Remaining Percentage: 95.5 %
Battery Voltage: 3.02 V
Brady Statistic AP VP Percent: 1 %
Brady Statistic AP VS Percent: 3.7 %
Brady Statistic AS VP Percent: 1.5 %
Brady Statistic AS VS Percent: 95 %
Brady Statistic RA Percent Paced: 3.6 %
Brady Statistic RV Percent Paced: 1.6 %
Date Time Interrogation Session: 20201028031534
Implantable Lead Implant Date: 20190409
Implantable Lead Implant Date: 20190409
Implantable Lead Location: 753859
Implantable Lead Location: 753860
Implantable Pulse Generator Implant Date: 20190409
Lead Channel Impedance Value: 550 Ohm
Lead Channel Impedance Value: 630 Ohm
Lead Channel Pacing Threshold Amplitude: 0.5 V
Lead Channel Pacing Threshold Amplitude: 0.5 V
Lead Channel Pacing Threshold Pulse Width: 0.5 ms
Lead Channel Pacing Threshold Pulse Width: 0.5 ms
Lead Channel Sensing Intrinsic Amplitude: 4.6 mV
Lead Channel Sensing Intrinsic Amplitude: 5 mV
Lead Channel Setting Pacing Amplitude: 2 V
Lead Channel Setting Pacing Amplitude: 2.5 V
Lead Channel Setting Pacing Pulse Width: 0.5 ms
Lead Channel Setting Sensing Sensitivity: 2 mV
Pulse Gen Model: 2272
Pulse Gen Serial Number: 9010865

## 2019-03-19 NOTE — Progress Notes (Signed)
Primary Care Physician: [name], PA-C Referring Physician: [facility] f/u EP: Dr. Rayann Heman Cardiologist: Dr. Rosalee Kaufman is a 48 y.o. male with a h/o morbid obesity, aortic stenosis, s/p AVR, COPD, afib, HTN, CHF, Depression, PE sleep apnea tx with cpap, that is in the afib clinic for f/u. He is in SR. He has been in [facility] 10/24 to 10/26  for   admission for hypotension, and sepsis secondary to pyelonephritis 03/08/2019. His BP was low and lisinopril was held. He is pending f/u with his PCP this week. He is feeling better this week. He is in SR today.   Today, he denies symptoms of palpitations, chest pain, shortness of breath, orthopnea, PND, lower extremity edema, dizziness, presyncope, syncope, or neurologic sequela. The patient is tolerating medications without difficulties and is otherwise without complaint today.   Past Medical History:  Diagnosis Date  . Anxiety   . Aortic stenosis   . Arthritis   . Asthma   . Back pain   . Cellulitis of skin with lymphangitis   . CHF (congestive heart failure) ([place])   . Chronic diastolic congestive heart failure ([place])   . Chronic venous insufficiency   . Constipation   . COPD (chronic obstructive pulmonary disease) ([place])   . Depression   . Dyspnea   . Essential hypertension   . Gout   . Heart murmur   . Hypertension   . Morbid obesity ([place])   . Obesity   . Pneumonia    walking pneumonia  . Pre-diabetes   . Prostatitis   . Pulmonary embolism ([place])   . Pulmonary hypertension ([place])   . Pyelonephritis   . S/P aortic valve replacement with bioprosthetic valve 08/23/2017   25 mm Edwards Inspiris Resilia stented bovine pericardial tissue valve  . 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 Sleep apnea    cpap   . Thoracic ascending aortic aneurysm ([place])   . Thoracic ascending aortic aneurysm ([place])   . Tobacco abuse    Past Surgical History:  Procedure Laterality Date   . AORTIC VALVE REPLACEMENT N/A 08/23/2017   Procedure: AORTIC VALVE REPLACEMENT (AVR) USING INSPIRIS RESILIA AORTIC VALVE SIZE 25 MM;  Surgeon: Rexene Alberts, MD;  Location: [place];  Service: Open Heart Surgery;  Laterality: N/A;  . MULTIPLE EXTRACTIONS WITH ALVEOLOPLASTY N/A 07/23/2017   Procedure: Extraction of tooth #10 with alveoloplasty and gross debridement of remaining teeth;  Surgeon: Lenn Cal, DDS;  Location: [place];  Service: Oral Surgery;  Laterality: N/A;  . NO PAST SURGERIES    . PACEMAKER IMPLANT N/A 08/28/2017    St Jude Medical Assurity MRI conditional  dual-chamber pacemaker for symptomatic complete heart blockby Dr Rayann Heman  . TEE WITHOUT CARDIOVERSION N/A 07/16/2017   Procedure: TRANSESOPHAGEAL ECHOCARDIOGRAM (TEE);  Surgeon: Lelon Perla, MD;  Location: [facility] ENDOSCOPY;  Service: Cardiovascular;  Laterality: N/A;  . TEE WITHOUT CARDIOVERSION N/A 08/23/2017   Procedure: TRANSESOPHAGEAL ECHOCARDIOGRAM (TEE);  Surgeon: Rexene Alberts, MD;  Location: [place];  Service: Open Heart Surgery;  Laterality: N/A;  . THORACIC AORTIC ANEURYSM REPAIR N/A 08/23/2017   Procedure: THORACIC ASCENDING ANEURYSM REPAIR (AAA) USING HEMASHIELD GOLD KNITTED MICROVEL DOUBLE VELOUR VASCULAR GRAFT D: 24 MM  L: 30 CM;  Surgeon: Rexene Alberts, MD;  Location: [place];  Service: Open Heart Surgery;  Laterality: N/A;    Current Outpatient Medications  Medication Sig Dispense Refill  .  acetaminophen (TYLENOL) 325 MG tablet Take 650 mg by mouth every 6 (six) hours as needed for mild pain.    Marland Kitchen albuterol (PROVENTIL) (2.5 MG/3ML) 0.083% nebulizer solution INHALE 1 VIAL VIA NEBULIZER EVERY 6 HOURS AS NEEDED FOR FOR WHEEZING OR SHORTNESS OF BREATH (Patient taking differently: Take 2.5 mg by nebulization every 6 (six) hours as needed for wheezing or shortness of breath. ) 270 mL 1  . albuterol (VENTOLIN HFA) 108 (90 Base) MCG/ACT inhaler Inhale 2 puffs into the lungs every 6 (six) hours as needed for wheezing or  shortness of breath. 6 g 1  . allopurinol (ZYLOPRIM) 300 MG tablet TAKE 1 Tablet BY MOUTH ONCE DAILY (Patient taking differently: Take 300 mg by mouth daily. ) 90 tablet 1  . busPIRone (BUSPAR) 10 MG tablet Take 10 mg by mouth 3 (three) times daily.     . citalopram (CELEXA) 40 MG tablet Take 40 mg by mouth daily.     Marland Kitchen docusate sodium (COLACE) 100 MG capsule Take 200 mg by mouth daily as needed for mild constipation.     . DULERA 200-5 MCG/ACT AERO INHALE 2 PUFFS BY MOUTH TWICE DAILY. RINSE MOUTH AFTER EACH USE (Patient taking differently: Inhale 2 puffs into the lungs 2 (two) times daily. ) 39 g 1  . furosemide (LASIX) 80 MG tablet Take 1 tablet (80 mg total) by mouth 2 (two) times daily. 180 tablet 3  . Krill Oil 350 MG CAPS Take 1 capsule by mouth daily.    Marland Kitchen loratadine (CLARITIN) 10 MG tablet Take 10 mg by mouth daily as needed for allergies.    . metolazone (ZAROXOLYN) 5 MG tablet Take 1 tablet (5 mg total) by mouth every Monday, Wednesday, and Friday. 30 tablet 6  . metoprolol tartrate (LOPRESSOR) 100 MG tablet TAKE 1 Tablet  BY MOUTH TWICE DAILY (Patient taking differently: Take 100 mg by mouth 2 (two) times daily. ) 180 tablet 0  . potassium chloride (K-DUR) 10 MEQ tablet Take 1 tablet (10 mEq total) by mouth daily. Take 3 Tablets on Monday, Wednesday, Friday (Patient taking differently: Take 10-30 mEq by mouth See admin instructions. Take 3 Tablets (30 mEq totally) by mouth on Monday, Wednesday, Friday and 1 tablet (10 mEq totally) by mouth other days) 90 tablet 6  . XARELTO 20 MG TABS tablet TAKE 1 Tablet BY MOUTH ONCE DAILY WITH SUPPER (Patient taking differently: Take 20 mg by mouth daily with supper. ) 90 tablet 0   No current facility-administered medications for this encounter.     No Known Allergies  Social History   Socioeconomic History  . Marital status: Divorced    Spouse name: Not on file  . Number of children: Not on file  . Years of education: Not on file  . Highest  education level: Not on file  Occupational History  . Not on file  Social Needs  . Financial resource strain: Somewhat hard  . Food insecurity    Worry: Never true    Inability: Never true  . Transportation needs    Medical: No    Non-medical: No  Tobacco Use  . Smoking status: Former Smoker    Packs/day: 0.50    Types: Cigarettes    Quit date: 08/23/2017    Years since quitting: 1.5  . Smokeless tobacco: Never Used  Substance and Sexual Activity  . Alcohol use: No    Frequency: Never    Comment: have had alcohol in the past, not heavy  .  Drug use: No  . Sexual activity: Not on file  Lifestyle  . Physical activity    Days per week: Not on file    Minutes per session: Not on file  . Stress: Not on file  Relationships  . Social Herbalist on phone: Not on file    Gets together: Not on file    Attends religious service: Not on file    Active member of club or organization: Not on file    Attends meetings of clubs or organizations: Not on file    Relationship status: Not on file  . Intimate partner violence    Fear of current or ex partner: Not on file    Emotionally abused: Not on file    Physically abused: Not on file    Forced sexual activity: Not on file  Other Topics Concern  . Not on file  Social History Narrative  . Not on file    Family History  Problem Relation Age of Onset  . Hypertension Mother   . Alzheimer's disease Mother   . Heart attack Brother   . Diabetes Brother     ROS- All systems are reviewed and negative except as per the HPI above  Physical Exam: Vitals:   03/19/19 0837  BP: 136/84  Pulse: 70  Weight: (!) 216.5 kg  Height: 5\' 11"  (1.803 m)   Wt Readings from Last 3 Encounters:  03/19/19 (!) 216.5 kg  03/17/19 (!) 214.3 kg  03/10/19 (!) 225.3 kg    Labs: Lab Results  Component Value Date   NA 135 03/17/2019   K 3.4 (L) 03/17/2019   CL 97 (L) 03/17/2019   CO2 26 03/17/2019   GLUCOSE 157 (H) 03/17/2019   BUN 35  (H) 03/17/2019   CREATININE 1.15 03/17/2019   CALCIUM 8.9 03/17/2019   MG 2.3 03/17/2019   Lab Results  Component Value Date   INR 1.3 (H) 03/16/2019   Lab Results  Component Value Date   CHOL 170 03/05/2019   HDL 27 (L) 03/05/2019   LDLCALC 71 03/05/2019   TRIG 362 (H) 03/05/2019     GEN- The patient is well appearing, alert and oriented x 3 today.   Head- normocephalic, atraumatic Eyes-  Sclera clear, conjunctiva pink Ears- hearing intact Oropharynx- clear Neck- supple, no JVP Lymph- no cervical lymphadenopathy Lungs- Clear to ausculation bilaterally, normal work of breathing Heart- Regular rate and rhythm, no murmurs, rubs or gallops, PMI not laterally displaced GI- soft, NT, ND, + BS Extremities- no clubbing, cyanosis, or edema MS- no significant deformity or atrophy Skin- no rash or lesion Psych- euthymic mood, full affect Neuro- strength and sensation are intact  EKG-NSR at 70 bpm, pr int 187 ms, qrs int 168 ms, qtc 490 ms Epic records reviewed    Assessment and Plan: 1. Paroxysmal atrial fibrillation In SR  Continue metoprolol 100 mg bid  continue xarelto 20 mg daily for CHA2DS2VASc score of 2  2.  Chronic diastolic CHF Patient reports that his weight is improved form hospitalization diuresis States went from 490 to today at 477 lbs  Continue torsemide and 2g sodium diet  Chronic diastolic CHF Patient reports that his SOB is improved.  Continue torsemide and 2g sodium diet  3. CHB Complete heart block S/p PPM, followed by Dr Rayann Heman and device clinic.  4. . Morbid obesity Body mass index is 67.64 kg/m.  Certainly contributing to his overall medical issues and SOB/fatigue. Lifestyle modification was discussed  and encouraged including regular physical activity and weight reduction.  5. 4. Aortic valve disease S/p AV surgery with bioprosthetic valve.  F/u with PCP for f/u hospitalization this week F/u with Dr. Bronson Ing as  scheduled in November   F/u with device clinic as scheduled per recall afib clinic as needed  [place]. Emanuella Nickle, [facility] [address] [place], [place] [address] [phone]

## 2019-03-21 LAB — CULTURE, BLOOD (ROUTINE X 2)
Culture: NO GROWTH
Culture: NO GROWTH
Special Requests: ADEQUATE
Special Requests: ADEQUATE

## 2019-03-24 ENCOUNTER — Ambulatory Visit: Payer: Self-pay | Admitting: Physician Assistant

## 2019-03-24 ENCOUNTER — Other Ambulatory Visit: Payer: Self-pay

## 2019-03-24 ENCOUNTER — Other Ambulatory Visit (HOSPITAL_COMMUNITY)
Admission: RE | Admit: 2019-03-24 | Discharge: 2019-03-24 | Disposition: A | Discharge status: Home / Self Care | Payer: Self-pay | Source: Ambulatory Visit | Attending: Physician Assistant | Admitting: Physician Assistant

## 2019-03-24 ENCOUNTER — Encounter: Payer: Self-pay | Admitting: Physician Assistant

## 2019-03-24 VITALS — BP 116/70 | HR 66 | Temp 98.4°F | Wt >= 6400 oz

## 2019-03-24 DIAGNOSIS — D649 Anemia, unspecified: Secondary | ICD-10-CM

## 2019-03-24 DIAGNOSIS — Z7901 Long term (current) use of anticoagulants: Secondary | ICD-10-CM

## 2019-03-24 DIAGNOSIS — E781 Pure hyperglyceridemia: Secondary | ICD-10-CM

## 2019-03-24 DIAGNOSIS — I10 Essential (primary) hypertension: Secondary | ICD-10-CM | POA: Insufficient documentation

## 2019-03-24 DIAGNOSIS — I5032 Chronic diastolic (congestive) heart failure: Secondary | ICD-10-CM

## 2019-03-24 DIAGNOSIS — F39 Unspecified mood [affective] disorder: Secondary | ICD-10-CM

## 2019-03-24 DIAGNOSIS — J449 Chronic obstructive pulmonary disease, unspecified: Secondary | ICD-10-CM

## 2019-03-24 DIAGNOSIS — Z953 Presence of xenogenic heart valve: Secondary | ICD-10-CM

## 2019-03-24 DIAGNOSIS — R0602 Shortness of breath: Secondary | ICD-10-CM

## 2019-03-24 LAB — BASIC METABOLIC PANEL
Anion gap: 11 (ref 5–15)
BUN: 27 mg/dL — ABNORMAL HIGH (ref 6–20)
CO2: 34 mmol/L — ABNORMAL HIGH (ref 22–32)
Calcium: 8.8 mg/dL — ABNORMAL LOW (ref 8.9–10.3)
Chloride: 92 mmol/L — ABNORMAL LOW (ref 98–111)
Creatinine, Ser: 1.27 mg/dL — ABNORMAL HIGH (ref 0.61–1.24)
GFR calc Af Amer: >60 mL/min (ref >60)
GFR calc non Af Amer: >60 mL/min (ref >60)
Glucose, Bld: 191 mg/dL — ABNORMAL HIGH (ref 70–99)
Potassium: 3.3 mmol/L — ABNORMAL LOW (ref 3.5–5.1)
Sodium: 137 mmol/L (ref 135–145)

## 2019-03-24 LAB — CBC
HCT: 42.6 % (ref 39.0–52.0)
Hemoglobin: 13.1 g/dL (ref 13.0–17.0)
MCH: 25.7 pg — ABNORMAL LOW (ref 26.0–34.0)
MCHC: 30.8 g/dL (ref 30.0–36.0)
MCV: 83.5 fL (ref 80.0–100.0)
Platelets: 331 10*3/uL (ref 150–400)
RBC: 5.1 MIL/uL (ref 4.22–5.81)
RDW: 16.7 % — ABNORMAL HIGH (ref 11.5–15.5)
WBC: 13.1 10*3/uL — ABNORMAL HIGH (ref 4.0–10.5)
nRBC: 0 % (ref 0.0–0.2)

## 2019-03-24 NOTE — Progress Notes (Signed)
BP 116/70   Pulse 66   Temp 98.4 F (36.9 C)   Wt (!) 483 lb 8 oz (219.3 kg)   SpO2 97%   BMI 67.43 kg/m    Subjective:    Patient ID: Reginald [name], male    DOB: 02/14/1971, 48 y.o.   MRN: [id number]  HPI: Reginald [name] is a 48 y.o. male presenting on 03/24/2019 for Follow-up   HPI   Patient had a negative COVID-19 screening questionnaire.   Patient is a 48 year old male with history of severe aortic stenosis status post repair, atrial fibrillation, history of PAD, hypertension, complete heart block, chronic diastolic congestive heart failure, Obstructive sleep apnea, morbid obesity, COPD, depression/anxiety.  He is still going to [facility] for counseling.  Recent hospitalization with discharge diagnoses of acute renal failure, sepsis, hypertension, abnormal liver function,.  Acute kidney injury with hypotension/dehydration was felt to be related to aggressive medical management of hypertension.  Patient says he is feeling much better today although he is feeling a bit weak.  Patient continues to have his dyspnea on exertion.  Relevant past medical, surgical, family and social history reviewed and updated as indicated. Interim medical history since our last visit reviewed. Allergies and medications reviewed and updated.   Current Outpatient Medications:  .  acetaminophen (TYLENOL) 325 MG tablet, Take 650 mg by mouth every 6 (six) hours as needed for mild pain., Disp: , Rfl:  .  albuterol (PROVENTIL) (2.5 MG/3ML) 0.083% nebulizer solution, INHALE 1 VIAL VIA NEBULIZER EVERY 6 HOURS AS NEEDED FOR FOR WHEEZING OR SHORTNESS OF BREATH (Patient taking differently: Take 2.5 mg by nebulization every 6 (six) hours as needed for wheezing or shortness of breath. ), Disp: 270 mL, Rfl: 1 .  albuterol (VENTOLIN HFA) 108 (90 Base) MCG/ACT inhaler, Inhale 2 puffs into the lungs every 6 (six) hours as needed for wheezing or shortness of breath., Disp: 6 g, Rfl: 1 .  allopurinol (ZYLOPRIM) 300 MG  tablet, TAKE 1 Tablet BY MOUTH ONCE DAILY (Patient taking differently: Take 300 mg by mouth daily. ), Disp: 90 tablet, Rfl: 1 .  busPIRone (BUSPAR) 10 MG tablet, Take 10 mg by mouth 3 (three) times daily. , Disp: , Rfl:  .  citalopram (CELEXA) 40 MG tablet, Take 40 mg by mouth daily. , Disp: , Rfl:  .  docusate sodium (COLACE) 100 MG capsule, Take 200 mg by mouth daily as needed for mild constipation. , Disp: , Rfl:  .  DULERA 200-5 MCG/ACT AERO, INHALE 2 PUFFS BY MOUTH TWICE DAILY. RINSE MOUTH AFTER EACH USE (Patient taking differently: Inhale 2 puffs into the lungs 2 (two) times daily. ), Disp: 39 g, Rfl: 1 .  furosemide (LASIX) 80 MG tablet, Take 1 tablet (80 mg total) by mouth 2 (two) times daily., Disp: 180 tablet, Rfl: 3 .  Krill Oil 350 MG CAPS, Take 1 capsule by mouth daily., Disp: , Rfl:  .  loratadine (CLARITIN) 10 MG tablet, Take 10 mg by mouth daily as needed for allergies., Disp: , Rfl:  .  metolazone (ZAROXOLYN) 5 MG tablet, Take 1 tablet (5 mg total) by mouth every Monday, Wednesday, and Friday., Disp: 30 tablet, Rfl: 6 .  metoprolol tartrate (LOPRESSOR) 100 MG tablet, TAKE 1 Tablet  BY MOUTH TWICE DAILY (Patient taking differently: Take 100 mg by mouth 2 (two) times daily. ), Disp: 180 tablet, Rfl: 0 .  potassium chloride (K-DUR) 10 MEQ tablet, Take 1 tablet (10 mEq total) by mouth daily. Take  3 Tablets on Monday, Wednesday, Friday (Patient taking differently: Take 10-30 mEq by mouth See admin instructions. Take 3 Tablets (30 mEq totally) by mouth on Monday, Wednesday, Friday and 1 tablet (10 mEq totally) by mouth other days), Disp: 90 tablet, Rfl: 6 .  XARELTO 20 MG TABS tablet, TAKE 1 Tablet BY MOUTH ONCE DAILY WITH SUPPER (Patient taking differently: Take 20 mg by mouth daily with supper. ), Disp: 90 tablet, Rfl: 0     Review of Systems  Per HPI unless specifically indicated above     Objective:    BP 116/70   Pulse 66   Temp 98.4 F (36.9 C)   Wt (!) 483 lb 8 oz (219.3  kg)   SpO2 97%   BMI 67.43 kg/m   Wt Readings from Last 3 Encounters:  03/24/19 (!) 483 lb 8 oz (219.3 kg)  03/19/19 (!) 477 lb 3.2 oz (216.5 kg)  03/17/19 (!) 472 lb 6.4 oz (214.3 kg)    Physical Exam Vitals signs reviewed.  Constitutional:      General: He is not in acute distress.    Appearance: He is well-developed. He is obese. He is not ill-appearing or toxic-appearing.  HENT:     Head: Normocephalic and atraumatic.  Neck:     Musculoskeletal: Neck supple.  Cardiovascular:     Rate and Rhythm: Normal rate and regular rhythm.  Pulmonary:     Effort: No respiratory distress.     Breath sounds: Normal breath sounds. No wheezing.     Comments: Patient noted to be quite DOE with ambulation.  His breathing returns to more normal when he sits down. Abdominal:     General: Bowel sounds are normal.     Palpations: Abdomen is soft.     Tenderness: There is no abdominal tenderness.  Musculoskeletal:     Right lower leg: Edema present.     Left lower leg: Edema present.  Lymphadenopathy:     Cervical: No cervical adenopathy.  Skin:    General: Skin is warm and dry.  Neurological:     Mental Status: He is alert and oriented to person, place, and time.  Psychiatric:        Attention and Perception: Attention normal.        Speech: Speech normal.        Behavior: Behavior normal. Behavior is cooperative.         Assessment & Plan:    Encounter Diagnoses  Name Primary?  . Essential hypertension Yes  . Anemia, unspecified type   . Super-super obese ([place])   . Anticoagulant long-term use   . SOB (shortness of breath)   . 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   . Mood disorder ([place])   . Chronic diastolic congestive heart failure ([place])   . Chronic obstructive pulmonary disease, unspecified COPD type ([place])   . Hypertriglyceridemia      We will check BMP to reevaluate potassium and creatinine.    Patient has  already had influenza vaccination  Patient to continue to stay off of his lisinopril at this time due to blood pressure readings.  Patient to continue with cardiology per their recommendations for management of aortic stenosis and atrial fibrillation and CHF and CHB.  No changes to medications today.  Patient is reminded to elevate feet when seated.  Encouraged weight loss.  Patient is still seeing nutritionist to help with this.  Patient to follow-up in 3 months.  He is to contact office sooner as needed.

## 2019-04-02 NOTE — Progress Notes (Signed)
Remote pacemaker transmission.   

## 2019-04-15 ENCOUNTER — Encounter: Payer: Self-pay | Admitting: Student

## 2019-04-15 ENCOUNTER — Other Ambulatory Visit: Payer: Self-pay

## 2019-04-15 ENCOUNTER — Ambulatory Visit (INDEPENDENT_AMBULATORY_CARE_PROVIDER_SITE_OTHER): Payer: Self-pay | Admitting: Student

## 2019-04-15 VITALS — BP 133/72 | HR 74 | Temp 96.6°F | Wt >= 6400 oz

## 2019-04-15 DIAGNOSIS — G4733 Obstructive sleep apnea (adult) (pediatric): Secondary | ICD-10-CM

## 2019-04-15 DIAGNOSIS — I442 Atrioventricular block, complete: Secondary | ICD-10-CM

## 2019-04-15 DIAGNOSIS — I10 Essential (primary) hypertension: Secondary | ICD-10-CM

## 2019-04-15 DIAGNOSIS — I350 Nonrheumatic aortic (valve) stenosis: Secondary | ICD-10-CM

## 2019-04-15 DIAGNOSIS — I5032 Chronic diastolic (congestive) heart failure: Secondary | ICD-10-CM

## 2019-04-15 DIAGNOSIS — I480 Paroxysmal atrial fibrillation: Secondary | ICD-10-CM

## 2019-04-15 NOTE — Progress Notes (Signed)
Cardiology Office Note    Date:  04/15/2019   ID:  Reginald [name], DOB April 11, 1971, MRN [id number]  PCP:  [name], PA-C  Cardiologist: Kate Sable, MD    Chief Complaint  Patient presents with   Hospitalization Follow-up    History of Present Illness:    Reginald [name] is a 48 y.o. male past medical history of severe aortic stenosis (s/p bovine tissue AVR in 08/2017 with repair of ascending thoracic aortic aneurysm), normal Cors by cardiac catheterization in 2019, paroxysmal atrial fibrillation, CHB (s/p PPM placement in 08/2017, history of bilateral PE, HTN, morbid obesity and OSA who presents to the office today for 10-month follow-up.  He was last examined by myself in 01/2019 and reported still having dyspnea with minimal activity but reported that his symptoms had improved with recent weight loss. He was previously unable to afford Torsemide and had been switched back to Lasix 80 mg daily along with taking Metolazone 5 mg every other day. Weight had declined to 496 lbs and was stable at 497 lbs during his office visit.  He was continued on Lasix 80 mg twice daily and Metolazone was continued at three times weekly.   In the interim, he was admitted to [facility] from 10/17 - 03/10/2019 secondary to pyelonephritis which improved with administration of IV Ceftriaxone. His PTA diuretics along with Lisinopril and Metoprolol were held at the time of admission given hypotension but were restarted at the time of discharge. Weight was listed at 495 lbs at discharge with creatinine stable at 1.06.  He was again admitted to [facility] from 10/24 - 03/17/2019 for evaluation of hypotension and decreased urine output. Found to have an AKI with creatinine elevated to 3.14 upon admission but improved to 1.15 at the time of hospital discharge. Lisinopril was discontinued during admission but he was continued on his other medications. An echocardiogram was obtained during admission which  showed a preserved EF of 60 to 65% with no regional wall motion abnormalities. His bioprosthetic aortic valve was poorly visualized but no significant stenosis or regurgitation noted.  He did follow-up with his PCP and repeat labs on 03/24/2019 showed creatinine was elevated 1.27 with K+ at 3.3.  It does not appear changes were made in his medication regimen although he reported feeling dehydrated.  In talking with the patient today, he reports having more frequent episodes of lightheadedness and dizziness since his recent admission which typically occurs with positional changes.  He still has dyspnea on exertion but denies any associated chest pain or palpitations. No recent orthopnea or PND. His lower extremity edema has improved and weight has actually declined to 476 lbs on the office scales today.  Past Medical History:  Diagnosis Date   Anxiety    Aortic stenosis    Arthritis    Asthma    Back pain    Cellulitis of skin with lymphangitis    CHF (congestive heart failure) (HCC)    Chronic diastolic congestive heart failure (HCC)    Chronic venous insufficiency    Constipation    COPD (chronic obstructive pulmonary disease) (HCC)    Depression    Dyspnea    Essential hypertension    Gout    Heart murmur    Hypertension    Morbid obesity ([place])    Obesity    Pneumonia    walking pneumonia   Pre-diabetes    Prostatitis    Pulmonary embolism ([place])    Pulmonary hypertension ([place])  Pyelonephritis    S/P aortic valve replacement with bioprosthetic valve 08/23/2017   25 mm Edwards Inspiris Resilia stented bovine pericardial tissue valve   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   24 mm Hemashield supracoronary straight graft    Sleep apnea    cpap    Thoracic ascending aortic aneurysm [facility])    Thoracic ascending aortic aneurysm (HCC)    Tobacco abuse     Past Surgical History:  Procedure Laterality Date   AORTIC VALVE REPLACEMENT N/A  08/23/2017   Procedure: AORTIC VALVE REPLACEMENT (AVR) USING INSPIRIS RESILIA AORTIC VALVE SIZE 25 MM;  Surgeon: Rexene Alberts, MD;  Location: [place];  Service: Open Heart Surgery;  Laterality: N/A;   MULTIPLE EXTRACTIONS WITH ALVEOLOPLASTY N/A 07/23/2017   Procedure: Extraction of tooth #10 with alveoloplasty and gross debridement of remaining teeth;  Surgeon: Lenn Cal, DDS;  Location: [place];  Service: Oral Surgery;  Laterality: N/A;   NO PAST SURGERIES     PACEMAKER IMPLANT N/A 08/28/2017    St Jude Medical Assurity MRI conditional  dual-chamber pacemaker for symptomatic complete heart blockby Dr Rayann Heman   TEE WITHOUT CARDIOVERSION N/A 07/16/2017   Procedure: TRANSESOPHAGEAL ECHOCARDIOGRAM (TEE);  Surgeon: Lelon Perla, MD;  [facility] ENDOSCOPY;  Service: Cardiovascular;  Laterality: N/A;   TEE WITHOUT CARDIOVERSION N/A 08/23/2017   Procedure: TRANSESOPHAGEAL ECHOCARDIOGRAM (TEE);  Surgeon: Rexene Alberts, MD;  Location: [place];  Service: Open Heart Surgery;  Laterality: N/A;   THORACIC AORTIC ANEURYSM REPAIR N/A 08/23/2017   Procedure: THORACIC ASCENDING ANEURYSM REPAIR (AAA) USING HEMASHIELD GOLD KNITTED MICROVEL DOUBLE VELOUR VASCULAR GRAFT D: 24 MM  L: 30 CM;  Surgeon: Rexene Alberts, MD;  Location: [place];  Service: Open Heart Surgery;  Laterality: N/A;    Current Medications: Outpatient Medications Prior to Visit  Medication Sig Dispense Refill   acetaminophen (TYLENOL) 325 MG tablet Take 650 mg by mouth every 6 (six) hours as needed for mild pain.     albuterol (PROVENTIL) (2.5 MG/3ML) 0.083% nebulizer solution INHALE 1 VIAL VIA NEBULIZER EVERY 6 HOURS AS NEEDED FOR FOR WHEEZING OR SHORTNESS OF BREATH (Patient taking differently: Take 2.5 mg by nebulization every 6 (six) hours as needed for wheezing or shortness of breath. ) 270 mL 1   albuterol (VENTOLIN HFA) 108 (90 Base) MCG/ACT inhaler Inhale 2 puffs into the lungs every 6 (six) hours as needed for wheezing or  shortness of breath. 6 g 1   allopurinol (ZYLOPRIM) 300 MG tablet TAKE 1 Tablet BY MOUTH ONCE DAILY (Patient taking differently: Take 300 mg by mouth daily. ) 90 tablet 1   busPIRone (BUSPAR) 10 MG tablet Take 10 mg by mouth 3 (three) times daily.      citalopram (CELEXA) 40 MG tablet Take 40 mg by mouth daily.      docusate sodium (COLACE) 100 MG capsule Take 200 mg by mouth daily as needed for mild constipation.      DULERA 200-5 MCG/ACT AERO INHALE 2 PUFFS BY MOUTH TWICE DAILY. RINSE MOUTH AFTER EACH USE (Patient taking differently: Inhale 2 puffs into the lungs 2 (two) times daily. ) 39 g 1   furosemide (LASIX) 80 MG tablet Take 1 tablet (80 mg total) by mouth 2 (two) times daily. 180 tablet 3   Krill Oil 350 MG CAPS Take 1 capsule by mouth daily.     loratadine (CLARITIN) 10 MG tablet Take 10 mg by mouth daily as needed for allergies.  metoprolol tartrate (LOPRESSOR) 100 MG tablet TAKE 1 Tablet  BY MOUTH TWICE DAILY (Patient taking differently: Take 100 mg by mouth 2 (two) times daily. ) 180 tablet 0   potassium chloride (K-DUR) 10 MEQ tablet Take 1 tablet (10 mEq total) by mouth daily. Take 3 Tablets on Monday, Wednesday, Friday (Patient taking differently: Take 10-30 mEq by mouth See admin instructions. Take 3 Tablets (30 mEq totally) by mouth on Monday, Wednesday, Friday and 1 tablet (10 mEq totally) by mouth other days) 90 tablet 6   XARELTO 20 MG TABS tablet TAKE 1 Tablet BY MOUTH ONCE DAILY WITH SUPPER (Patient taking differently: Take 20 mg by mouth daily with supper. ) 90 tablet 0   metolazone (ZAROXOLYN) 5 MG tablet Take 1 tablet (5 mg total) by mouth every Monday, Wednesday, and Friday. 30 tablet 6   No facility-administered medications prior to visit.      Allergies:   Patient has no known allergies.   Social History   Socioeconomic History   Marital status: Divorced    Spouse name: Not on file   Number of children: Not on file   Years of education: Not on  file   Highest education level: Not on file  Occupational History   Not on file  Social Needs   Financial resource strain: Somewhat hard   Food insecurity    Worry: Never true    Inability: Never true   Transportation needs    Medical: No    Non-medical: No  Tobacco Use   Smoking status: Former Smoker    Packs/day: 0.50    Types: Cigarettes    Quit date: 08/23/2017    Years since quitting: 1.6   Smokeless tobacco: Never Used  Substance and Sexual Activity   Alcohol use: No    Frequency: Never    Comment: have had alcohol in the past, not heavy   Drug use: No   Sexual activity: Not on file  Lifestyle   Physical activity    Days per week: Not on file    Minutes per session: Not on file   Stress: Not on file  Relationships   Social connections    Talks on phone: Not on file    Gets together: Not on file    Attends religious service: Not on file    Active member of club or organization: Not on file    Attends meetings of clubs or organizations: Not on file    Relationship status: Not on file  Other Topics Concern   Not on file  Social History Narrative   Not on file     Family History:  [name] family history includes Alzheimer's disease in his mother; Diabetes in his brother; Heart attack in his brother; Hypertension in his mother.   Review of Systems:   Please see the history of present illness.     General:  No chills, fever, or night sweats. Positive for dizziness and weight loss.  Cardiovascular:  No chest pain, edema, orthopnea, palpitations, paroxysmal nocturnal dyspnea. Positive for dyspnea on exertion.  Dermatological: No rash, lesions/masses Respiratory: No cough, dyspnea Urologic: No hematuria, dysuria Abdominal:   No nausea, vomiting, diarrhea, bright red blood per rectum, melena, or hematemesis Neurologic:  No visual changes, wkns, changes in mental status. All other systems reviewed and are otherwise negative except as noted  above.   Physical Exam:    VS:  BP 133/72    Pulse 74    Temp (!) 96.6  F (35.9 C)    Wt (!) 476 lb (215.9 kg)    SpO2 97%    BMI 66.39 kg/m    General: Well developed, obese Caucasian male appearing in no acute distress. Head: Normocephalic, atraumatic, sclera non-icteric, no xanthomas, nares are without discharge.  Neck: No carotid bruits. JVD difficult to assess secondary to body habitus.   Lungs: Respirations regular and unlabored, without wheezes or rales.  Heart: Regular rate and rhythm. No S3 or S4.  No murmur, no rubs, or gallops appreciated. Abdomen: Soft, non-tender, non-distended with normoactive bowel sounds. No hepatomegaly. No rebound/guarding. No obvious abdominal masses. Msk:  Strength and tone appear normal for age. No joint deformities or effusions. Extremities: No clubbing or cyanosis. Trace lower extremity edema bilaterally.  Distal pedal pulses are 2+ bilaterally. Neuro: Alert and oriented X 3. Moves all extremities spontaneously. No focal deficits noted. Psych:  Responds to questions appropriately with a normal affect. Skin: No rashes or lesions noted  Wt Readings from Last 3 Encounters:  04/15/19 (!) 476 lb (215.9 kg)  03/24/19 (!) 483 lb 8 oz (219.3 kg)  03/19/19 (!) 477 lb 3.2 oz (216.5 kg)     Studies/Labs Reviewed:   EKG:  EKG is not ordered today. EKG from 03/19/2019 is reviewed which shows NSR, HR 70 with known RBBB.   Recent Labs: 03/08/2019: TSH 3.834 03/15/2019: B Natriuretic Peptide 40.0 03/16/2019: ALT 37 03/17/2019: Magnesium 2.3 03/24/2019: BUN 27; Creatinine, Ser 1.27; Hemoglobin 13.1; Platelets 331; Potassium 3.3; Sodium 137   Lipid Panel    Component Value Date/Time   CHOL 170 03/05/2019 1127   TRIG 362 (H) 03/05/2019 1127   HDL 27 (L) 03/05/2019 1127   CHOLHDL 6.3 03/05/2019 1127   VLDL 72 (H) 03/05/2019 1127   LDLCALC 71 03/05/2019 1127    Additional studies/ records that were reviewed today include:   Cardiac Catheterization:  06/2017 1. No angiographic evidence of CAD 2. Severe aortic stenosis by TEE/TTE.  Due to turbulent flow in the ascending aorta and movement of the catheters, I could not cross the valve. The patient had a TEE this am that demonstrated severe AS. I did not feel that further attempts at crossing the valve would provide any clinical data that would change his treatment plan, though more attempts increased the risk of procedure complication. 3. Normal right heart pressures  Recommendations: Will continue planning for AVR vs TAVR. Pt to f/u with Dr. Roxy Manns after CT scans.   Echocardiogram: 03/16/2019 IMPRESSIONS    1. Left ventricular ejection fraction, by visual estimation, is 60 to 65%. The left ventricle has normal function. Normal left ventricular size. There is no left ventricular hypertrophy.  2. Global right ventricle has mildly reduced systolic function.The right ventricular size is not well visualized. No increase in right ventricular wall thickness.  3. Left atrial size was not well visualized.  4. Right atrial size was not well visualized.  5. The mitral valve was not well visualized. No evidence of mitral valve regurgitation. No evidence of mitral stenosis.  6. The tricuspid valve is not well visualized. Tricuspid valve regurgitation is trivial.  7. Poorly visualized Bioprosthetic aortic valve valve is present in the aortic position.  8. Pulmonic valve poorly visualized.  9. S/P Ascending thoraici aortic aneurysm repair. Aortic root and ascending aorta poorly visualized. 10. Mildly elevated pulmonary artery systolic pressure.   Assessment:    1. Chronic heart failure with preserved ejection fraction (HFpEF) ([place])   2. Aortic valve stenosis, etiology of  cardiac valve disease unspecified   3. CHB (complete heart block) (HCC)   4. PAF (paroxysmal atrial fibrillation) ([place])   5. Essential hypertension   6. OSA (obstructive sleep apnea)      Plan:   In order of problems listed  above:  1. Chronic Diastolic CHF - In the setting of his recent admissions secondary to pyelonephritis and AKI, it was thought he was likely dehydrated at that time. Creatinine peaked at 3.14 in 02/2019 and was still above baseline when checked by his PCP on 03/24/2019 (at 1.27). - He has been experiencing worsening dizziness with positional changes which raises concern for orthostasis. Orthostatics were negative when checked at today's visit. Given the concern for dehydration and due to his worsening renal function, will plan to stop Metolazone at this time and continue with Lasix 80 mg twice daily. He is unable to obtain accurate weights on his home scales, therefore he will come to the office next Thursday to obtain a repeat weight. If trending upwards, may need to restart Metolazone at once weekly dosing. Unable to afford Torsemide in the past. Will repeat a BMET in 2 weeks.   2. Severe Aortic Stenosis - s/p bovine tissue AVR in 08/2017 with repair of ascending thoracic aortic aneurysm. Aortic valve was poorly visualized by recent echocardiogram last month but no significant abnormalities noted.  3. CHB  - s/p PPM placement in 08/2017. Followed by Dr. Rayann Heman and most recent device interrogation in 02/2019 showed normal device function.   4. Paroxysmal Atrial Fibrillation - He denies any recent palpitations and is in normal sinus rhythm by examination today. Continue Lopressor 100 mg twice daily for rate control along with Xarelto for anticoagulation.    5. HTN - BP was initially reported as 150/88, improved to 133/72 on recheck. Lisinopril was discontinued during his recent admission as outlined above. Continue Lopressor at current dosing for now.   6. OSA - he reports good compliance with CPAP.   Medication Adjustments/Labs and Tests Ordered: Current medicines are reviewed at length with the patient today.  Concerns regarding medicines are outlined above.  Medication changes, Labs and  Tests ordered today are listed in the Patient Instructions below. Patient Instructions  Medication Instructions:   STOP Metolazone   *If you need a refill on your cardiac medications before your next appointment, please call your pharmacy*  Lab Work: BMET in 2 weeks  ( 12/8 )  If you have labs (blood work) drawn today and your tests are completely normal, you will receive your results only by:  [place] (if you have MyChart) OR  A paper copy in the mail If you have any lab test that is abnormal or we need to change your treatment, we will call you to review the results.  Testing/Procedures: None today  Follow-Up: At [facility], you and your health needs are our priority.  As part of our continuing mission to provide you with exceptional heart care, we have created designated Provider Care Teams.  These Care Teams include your primary Cardiologist (physician) and Advanced Practice Providers (APPs -  Physician Assistants and Nurse Practitioners) who all work together to provide you with the care you need, when you need it.  Your next appointment:   2 month(s)  The format for your next appointment:   In Person  Provider:   You may see Kate Sable, MD or one of the following Advanced Practice Providers on your designated Care Team:    Bernerd Pho, [place]  Other Instructions Come to office to weigh self on Thursday, December 3 rd    Thank you for choosing [facility] !         Signed, Erma Heritage, PA-C  04/15/2019 5:45 PM    [place] [address] [place], [place] [address] Phone: [phone] Fax: [phone]

## 2019-04-15 NOTE — Patient Instructions (Signed)
Medication Instructions:   STOP Metolazone   *If you need a refill on your cardiac medications before your next appointment, please call your pharmacy*  Lab Work: BMET in 2 weeks  ( 12/8 )  If you have labs (blood work) drawn today and your tests are completely normal, you will receive your results only by: Marland Kitchen MyChart Message (if you have MyChart) OR . A paper copy in the mail If you have any lab test that is abnormal or we need to change your treatment, we will call you to review the results.  Testing/Procedures: None today  Follow-Up: At [facility], you and your health needs are our priority.  As part of our continuing mission to provide you with exceptional heart care, we have created designated Provider Care Teams.  These Care Teams include your primary Cardiologist (physician) and Advanced Practice Providers (APPs -  Physician Assistants and Nurse Practitioners) who all work together to provide you with the care you need, when you need it.  Your next appointment:   2 month(s)  The format for your next appointment:   In Person  Provider:   You may see Kate Sable, MD or one of the following Advanced Practice Providers on your designated Care Team:    Bernerd Pho, [place]      Other Instructions Come to office to weigh self on Thursday, December 3 rd          Thank you for choosing [facility] !

## 2019-04-16 ENCOUNTER — Telehealth: Payer: Self-pay

## 2019-04-16 NOTE — Telephone Encounter (Signed)
Pt. Eligibility is 04/16/2019 till 04/15/2020 with [facility]. I sent [facility] application and other docs to [place].  Drema Halon

## 2019-04-23 ENCOUNTER — Other Ambulatory Visit: Payer: Self-pay | Admitting: Physician Assistant

## 2019-04-24 ENCOUNTER — Telehealth: Payer: Self-pay

## 2019-04-24 ENCOUNTER — Other Ambulatory Visit: Payer: Self-pay | Admitting: Physician Assistant

## 2019-04-24 ENCOUNTER — Other Ambulatory Visit: Payer: Self-pay | Admitting: Student

## 2019-04-24 DIAGNOSIS — Z79899 Other long term (current) drug therapy: Secondary | ICD-10-CM

## 2019-04-24 MED ORDER — ALBUTEROL SULFATE HFA 108 (90 BASE) MCG/ACT IN AERS: 2.0000 | INHALATION_SPRAY | Freq: Four times a day (QID) | RESPIRATORY_TRACT | 1 refills | Status: DC | PRN

## 2019-04-24 MED ORDER — DULERA 200-5 MCG/ACT IN AERO: INHALATION_SPRAY | RESPIRATORY_TRACT | 1 refills | Status: DC

## 2019-04-24 MED ORDER — ALBUTEROL SULFATE (2.5 MG/3ML) 0.083% IN NEBU: INHALATION_SOLUTION | RESPIRATORY_TRACT | 0 refills | Status: DC

## 2019-04-24 MED ORDER — RIVAROXABAN 20 MG PO TABS: ORAL_TABLET | ORAL | 0 refills | Status: DC

## 2019-04-24 MED ORDER — METOPROLOL TARTRATE 100 MG PO TABS: 100.0000 mg | ORAL_TABLET | Freq: Two times a day (BID) | ORAL | 0 refills | Status: DC

## 2019-04-24 MED ORDER — ALLOPURINOL 300 MG PO TABS: 300.0000 mg | ORAL_TABLET | Freq: Every day | ORAL | 1 refills | Status: DC

## 2019-04-24 NOTE — Telephone Encounter (Signed)
    Yes, if weight has acutely increased would recommend restarting Metolazone as this was stopped last visit due to dehydration following his recent admission. Would restart at 5mg  on Monday and Friday and take extra K-dur those days as previously instructed. Keep plans for scheduled BMET next week.   Signed, Erma Heritage, PA-C 04/24/2019, 4:04 PM Pager: [phone]

## 2019-04-24 NOTE — Telephone Encounter (Signed)
Patient stopped by today for weight check.  Wight 484 lbs ( wt at last visit 476 lbs)   Stated he has tightness in his upper abdomen, can't lie flat.says his legs are good.   Felt reflux earlier and took medicine and sx's went away. He wonders if metolazone can be used again.

## 2019-04-24 NOTE — Telephone Encounter (Signed)
This is a [place] pt.  °

## 2019-04-25 MED ORDER — POTASSIUM CHLORIDE ER 10 MEQ PO TBCR: 10.0000 meq | EXTENDED_RELEASE_TABLET | ORAL | 3 refills | Status: DC

## 2019-04-25 MED ORDER — METOLAZONE 5 MG PO TABS: ORAL_TABLET | ORAL | 3 refills | Status: DC

## 2019-05-01 ENCOUNTER — Other Ambulatory Visit (HOSPITAL_COMMUNITY)
Admission: RE | Admit: 2019-05-01 | Discharge: 2019-05-01 | Disposition: A | Discharge status: Home / Self Care | Payer: Self-pay | Source: Ambulatory Visit | Attending: Student | Admitting: Student

## 2019-05-01 DIAGNOSIS — Z79899 Other long term (current) drug therapy: Secondary | ICD-10-CM | POA: Insufficient documentation

## 2019-05-01 LAB — BASIC METABOLIC PANEL
Anion gap: 16 — ABNORMAL HIGH (ref 5–15)
BUN: 30 mg/dL — ABNORMAL HIGH (ref 6–20)
CO2: 33 mmol/L — ABNORMAL HIGH (ref 22–32)
Calcium: 8.8 mg/dL — ABNORMAL LOW (ref 8.9–10.3)
Chloride: 86 mmol/L — ABNORMAL LOW (ref 98–111)
Creatinine, Ser: 1.42 mg/dL — ABNORMAL HIGH (ref 0.61–1.24)
GFR calc Af Amer: >60 mL/min (ref >60)
GFR calc non Af Amer: 58 mL/min — ABNORMAL LOW (ref >60)
Glucose, Bld: 203 mg/dL — ABNORMAL HIGH (ref 70–99)
Potassium: 2.8 mmol/L — ABNORMAL LOW (ref 3.5–5.1)
Sodium: 135 mmol/L (ref 135–145)

## 2019-05-08 ENCOUNTER — Telehealth: Payer: Self-pay | Admitting: *Deleted

## 2019-05-08 DIAGNOSIS — Z79899 Other long term (current) drug therapy: Secondary | ICD-10-CM

## 2019-05-08 MED ORDER — METOLAZONE 5 MG PO TABS: 5.0000 mg | ORAL_TABLET | ORAL | 11 refills | Status: DC

## 2019-05-08 NOTE — Telephone Encounter (Signed)
-----   Message from Erma Heritage, [place] sent at 05/01/2019  4:23 PM EST ----- Please let the patient know that his kidney function has slightly worsened given the requirement for metolazone.  Given that his weight has declined by 9 pounds since last week, would recommend reducing Metolazone to once weekly (can do Monday's or Friday's depending on his preference).  Potassium was significantly low.  Please have him take K-dur 30 mEq for the next 4 days then resume his usual dosing. Repeat BMET in 2 weeks.

## 2019-05-08 NOTE — Telephone Encounter (Signed)
Pt notified order placed 

## 2019-05-19 ENCOUNTER — Encounter: Payer: Self-pay | Attending: Physician Assistant | Admitting: Nutrition

## 2019-05-19 ENCOUNTER — Encounter: Payer: Self-pay | Admitting: Nutrition

## 2019-05-19 ENCOUNTER — Telehealth: Payer: Self-pay

## 2019-05-19 ENCOUNTER — Other Ambulatory Visit: Payer: Self-pay

## 2019-05-19 ENCOUNTER — Other Ambulatory Visit (HOSPITAL_COMMUNITY)
Admission: RE | Admit: 2019-05-19 | Discharge: 2019-05-19 | Disposition: A | Discharge status: Home / Self Care | Payer: Self-pay | Source: Ambulatory Visit | Attending: Student | Admitting: Student

## 2019-05-19 VITALS — Ht 71.0 in | Wt >= 6400 oz

## 2019-05-19 DIAGNOSIS — I10 Essential (primary) hypertension: Secondary | ICD-10-CM | POA: Insufficient documentation

## 2019-05-19 DIAGNOSIS — Z6841 Body Mass Index (BMI) 40.0 and over, adult: Secondary | ICD-10-CM | POA: Insufficient documentation

## 2019-05-19 DIAGNOSIS — Z79899 Other long term (current) drug therapy: Secondary | ICD-10-CM | POA: Insufficient documentation

## 2019-05-19 DIAGNOSIS — I5030 Unspecified diastolic (congestive) heart failure: Secondary | ICD-10-CM | POA: Insufficient documentation

## 2019-05-19 DIAGNOSIS — R739 Hyperglycemia, unspecified: Secondary | ICD-10-CM | POA: Insufficient documentation

## 2019-05-19 LAB — BASIC METABOLIC PANEL
Anion gap: 15 (ref 5–15)
BUN: 19 mg/dL (ref 6–20)
CO2: 25 mmol/L (ref 22–32)
Calcium: 8.8 mg/dL — ABNORMAL LOW (ref 8.9–10.3)
Chloride: 99 mmol/L (ref 98–111)
Creatinine, Ser: 0.99 mg/dL (ref 0.61–1.24)
GFR calc Af Amer: >60 mL/min (ref >60)
GFR calc non Af Amer: >60 mL/min (ref >60)
Glucose, Bld: 204 mg/dL — ABNORMAL HIGH (ref 70–99)
Potassium: 3.4 mmol/L — ABNORMAL LOW (ref 3.5–5.1)
Sodium: 139 mmol/L (ref 135–145)

## 2019-05-19 NOTE — Progress Notes (Signed)
  Medical Nutrition Therapy:  Appt start time: 1000 and time:  1030  Assessment:  Primary concerns today:  Morbid Obesity. Seen at the  [facility]. Impaired fasting glucose, HTN.  He feels like he holding a lot fluid. 20 lbs weight increase in the last 30 days. Takes Metolazone 5 mg oncel weekly and Lasix 80 mg BID.  Potassium was low last time and Creatine level increasing some. Getting labs done today to check his fluid and electrolyte status. He notes he is really tried today and knows he is retaining fluid. Has been getting some meals  From [facility] and gets one meal a day. Trying to stay away from salt foods. Had some ham over Christmas. Going to MD to get blood work today and get weighted. Still eating bacon and some salty foods at times. Most meals eaten are provided through food assistance from [employer].  Limited mobility due to severe obesity and short of breath.  CMP Latest Ref Rng & Units 05/01/2019 03/24/2019 03/17/2019  Glucose 70 - 99 mg/dL 203(H) 191(H) 157(H)  BUN 6 - 20 mg/dL 30(H) 27(H) 35(H)  Creatinine 0.61 - 1.24 mg/dL 1.42(H) 1.27(H) 1.15  Sodium 135 - 145 mmol/L 135 137 135  Potassium 3.5 - 5.1 mmol/L 2.8(L) 3.3(L) 3.4(L)  Chloride 98 - 111 mmol/L 86(L) 92(L) 97(L)  CO2 22 - 32 mmol/L 33(H) 34(H) 26  Calcium 8.9 - 10.3 mg/dL 8.8(L) 8.8(L) 8.9  Total Protein 6.5 - 8.1 g/dL - - -  Total Bilirubin 0.3 - 1.2 mg/dL - - -  Alkaline Phos 38 - 126 U/L - - -  AST 15 - 41 U/L - - -  ALT 0 - 44 U/L - - -     Preferred Learning Style:   No preference indicated   Learning Readiness:   Ready  Change in progress   MEDICATIONS:    DIETARY INTAKE: 24-hr recall:  B ( AM):  BTL sandwich, water L ( PM): Rice, chicken, cabbage, water D same as lunch, water   Beverages:  Usual physical activity:ADL  Estimated energy needs: 1600  calories 180 g carbohydrates 120 g protein 44 g fat  Progress Towards Goal(s):  In progress.   Nutritional Diagnosis:   NI-1.5 Excessive energy intake As related to high calorie high fat high salt diet.  As evidenced by BMI 66 .    Intervention:  Nutrition and prediabetes education provided on My Plate, CHO counting, meal planning, portion sizes, timing of meals, avoiding snacks between meals  taking medications as prescribed, benefits of exercising 30 minutes per day and prevention of  DM. Potassium rich foods. Healthy weight loss habits. Low sodium high fiber diet.    Goals   Cut out bacon and all processed meats Eat more low sodium fresh or frozen vegetables. Eat fruit with meals. Take meds as prescribed.   Teaching Method Utilized:  Visual Auditory Hands on  Handouts given during visit include:  The Plate Method   Meal Plan Card   Barriers to learning/adherence to lifestyle change: Morbid obesity and no income or job  Demonstrated degree of understanding via:  Teach Back   Monitoring/Evaluation:  Dietary intake, exercise, meal plannign , and body weight in 3 month(s).

## 2019-05-19 NOTE — Telephone Encounter (Signed)
Pt came in to be weighed. His current weight is 494 lbs. He is a little more SOB. He states that he has not ate a lot over the holiday. He is getting lab work done today as well. Will forward to [place], PA-C.

## 2019-05-19 NOTE — Patient Instructions (Addendum)
Goals   Cut out bacon and all processed meats Eat more low sodium fresh or frozen vegetables. Eat fruit with meals. Take meds as prescribed.

## 2019-05-19 NOTE — Telephone Encounter (Signed)
    Given his weight gain, please have him take an extra Metolazone tablet this week then go back to just once weekly. Repeat labs shows his kidney function has returned to baseline as creatinine was previously 1.42 and is now at 0.99. K+ also improved but still slightly low. Continue with extra K-dur dosing on days he uses Metolazone.   Signed, Erma Heritage, PA-C 05/19/2019, 2:03 PM Pager: [phone]

## 2019-05-20 NOTE — Telephone Encounter (Signed)
Pt made aware. Voiced understanding of plan.

## 2019-05-28 ENCOUNTER — Ambulatory Visit (INDEPENDENT_AMBULATORY_CARE_PROVIDER_SITE_OTHER): Payer: Self-pay | Admitting: *Deleted

## 2019-05-28 DIAGNOSIS — I480 Paroxysmal atrial fibrillation: Secondary | ICD-10-CM

## 2019-05-29 LAB — CUP PACEART REMOTE DEVICE CHECK
Battery Remaining Longevity: 128 mo
Battery Remaining Percentage: 95.5 %
Battery Voltage: 3.02 V
Brady Statistic AP VP Percent: 1 %
Brady Statistic AP VS Percent: 3.6 %
Brady Statistic AS VP Percent: 1.6 %
Brady Statistic AS VS Percent: 95 %
Brady Statistic RA Percent Paced: 3.5 %
Brady Statistic RV Percent Paced: 1.7 %
Date Time Interrogation Session: 20210106020024
Implantable Lead Implant Date: 20190409
Implantable Lead Implant Date: 20190409
Implantable Lead Location: 753859
Implantable Lead Location: 753860
Implantable Pulse Generator Implant Date: 20190409
Lead Channel Impedance Value: 560 Ohm
Lead Channel Impedance Value: 600 Ohm
Lead Channel Pacing Threshold Amplitude: 0.5 V
Lead Channel Pacing Threshold Amplitude: 0.5 V
Lead Channel Pacing Threshold Pulse Width: 0.5 ms
Lead Channel Pacing Threshold Pulse Width: 0.5 ms
Lead Channel Sensing Intrinsic Amplitude: 5 mV
Lead Channel Sensing Intrinsic Amplitude: 7.6 mV
Lead Channel Setting Pacing Amplitude: 2 V
Lead Channel Setting Pacing Amplitude: 2.5 V
Lead Channel Setting Pacing Pulse Width: 0.5 ms
Lead Channel Setting Sensing Sensitivity: 2 mV
Pulse Gen Model: 2272
Pulse Gen Serial Number: 9010865

## 2019-06-08 ENCOUNTER — Other Ambulatory Visit: Payer: Self-pay

## 2019-06-08 ENCOUNTER — Emergency Department (HOSPITAL_COMMUNITY): Payer: Self-pay

## 2019-06-08 ENCOUNTER — Encounter (HOSPITAL_COMMUNITY): Payer: Self-pay | Admitting: *Deleted

## 2019-06-08 ENCOUNTER — Emergency Department (HOSPITAL_COMMUNITY)
Admission: EM | Admit: 2019-06-08 | Discharge: 2019-06-08 | Disposition: A | Discharge status: Home / Self Care | Payer: Self-pay | Attending: Emergency Medicine | Admitting: Emergency Medicine

## 2019-06-08 DIAGNOSIS — Z87891 Personal history of nicotine dependence: Secondary | ICD-10-CM | POA: Insufficient documentation

## 2019-06-08 DIAGNOSIS — I110 Hypertensive heart disease with heart failure: Secondary | ICD-10-CM | POA: Insufficient documentation

## 2019-06-08 DIAGNOSIS — Y9389 Activity, other specified: Secondary | ICD-10-CM | POA: Insufficient documentation

## 2019-06-08 DIAGNOSIS — Z954 Presence of other heart-valve replacement: Secondary | ICD-10-CM | POA: Insufficient documentation

## 2019-06-08 DIAGNOSIS — X500XXA Overexertion from strenuous movement or load, initial encounter: Secondary | ICD-10-CM | POA: Insufficient documentation

## 2019-06-08 DIAGNOSIS — Y92812 Truck as the place of occurrence of the external cause: Secondary | ICD-10-CM | POA: Insufficient documentation

## 2019-06-08 DIAGNOSIS — Z79899 Other long term (current) drug therapy: Secondary | ICD-10-CM | POA: Insufficient documentation

## 2019-06-08 DIAGNOSIS — J449 Chronic obstructive pulmonary disease, unspecified: Secondary | ICD-10-CM | POA: Insufficient documentation

## 2019-06-08 DIAGNOSIS — I509 Heart failure, unspecified: Secondary | ICD-10-CM | POA: Insufficient documentation

## 2019-06-08 DIAGNOSIS — S8392XA Sprain of unspecified site of left knee, initial encounter: Secondary | ICD-10-CM | POA: Insufficient documentation

## 2019-06-08 DIAGNOSIS — Z950 Presence of cardiac pacemaker: Secondary | ICD-10-CM | POA: Insufficient documentation

## 2019-06-08 DIAGNOSIS — Y999 Unspecified external cause status: Secondary | ICD-10-CM | POA: Insufficient documentation

## 2019-06-08 MED ORDER — BARIATRIC CANE MISC: 0 refills | Status: DC

## 2019-06-08 MED ORDER — HYDROCODONE-ACETAMINOPHEN 5-325 MG PO TABS: 1.0000 | ORAL_TABLET | Freq: Four times a day (QID) | ORAL | 0 refills | Status: DC | PRN

## 2019-06-08 NOTE — ED Triage Notes (Signed)
Pt states that he was getting up into his truck when he felt his left knee pop,

## 2019-06-08 NOTE — ED Notes (Signed)
Discussed w Dr Lacinda Axon that we do  Not have crutches that will support this pt weight   He gives a VO to DC crutches

## 2019-06-08 NOTE — Discharge Instructions (Addendum)
You can take 1000 mg of Tylenol.  Do not exceed 4000 mg of Tylenol a day.  Take pain medications as directed for break through pain. Do not drive or operate machinery while taking this medication.   Follow the RICE (Rest, Ice, Compression, Elevation) protocol as directed.   Follow-up with orthopedics if your symptoms do not improve.  Return the emergency department for some pain, numbness, discoloration or any other worsening concerning symptoms.

## 2019-06-08 NOTE — ED Provider Notes (Signed)
[facility] EMERGENCY DEPARTMENT Provider Note   CSN: [id number] Arrival date & time: 06/08/19  1720     History Chief Complaint  Patient presents with  . Knee Pain    Reginald [name] is a 49 y.o. male history of anxiety, cellulitis, CHF, depression, hypertension who presents for evaluation of left knee pain that began today.  He reports that he was stepping up into his truck when he felt his knee pop and started having pain.  He was able to put some weight on it and walk back home.  He states that again when he was getting up the steps of his house, he felt like it popped again and since then has been having pain.  Most of his pain is to the posterior lateral aspect.  He states that it hurts more when he tries to walk on it or move on it.  He states he has little bit of tingling sensation in his toes.   The history is provided by the patient.       Past Medical History:  Diagnosis Date  . Anxiety   . Aortic stenosis   . Arthritis   . Asthma   . Back pain   . Cellulitis of skin with lymphangitis   . CHF (congestive heart failure) ([place])   . Chronic diastolic congestive heart failure ([place])   . Chronic venous insufficiency   . Constipation   . COPD (chronic obstructive pulmonary disease) ([place])   . Depression   . Dyspnea   . Essential hypertension   . Gout   . Heart murmur   . Hypertension   . Morbid obesity ([place])   . Obesity   . Pneumonia    walking pneumonia  . Pre-diabetes   . Prostatitis   . Pulmonary embolism ([place])   . Pulmonary hypertension ([place])   . Pyelonephritis   . S/P aortic valve replacement with bioprosthetic valve 08/23/2017   25 mm Edwards Inspiris Resilia stented bovine pericardial tissue valve  . 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 Sleep apnea    cpap   . Thoracic ascending aortic aneurysm ([place])   . Thoracic ascending aortic aneurysm ([place])   . Tobacco abuse     Patient Active Problem List   Diagnosis Date Noted  . ARF (acute renal failure) ([place]) 03/16/2019  . Abnormal liver function 03/16/2019  . History of pulmonary embolism 03/16/2019  . Acute kidney injury ([place]) 03/09/2019  . Sepsis ([place]) 03/08/2019  . Sepsis secondary to UTI ([place]) 03/08/2019  . Paroxysmal atrial fibrillation ([place]) 09/05/2018  . Chronic obstructive pulmonary disease ([place]) 05/07/2018  . Gastroesophageal reflux disease   . Pulmonary embolism ([place]) 09/18/2017  . Encounter for therapeutic drug monitoring 09/04/2017  . 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 08/23/2017  . 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 Pulmonary hypertension ([place])   . Chronic periodontitis 07/18/2017  . Essential hypertension   . Tobacco abuse   . Chronic heart failure with preserved ejection fraction (HFpEF) ([place])   . Chronic venous insufficiency     Past Surgical History:  Procedure Laterality Date  . AORTIC VALVE REPLACEMENT N/A 08/23/2017   Procedure: AORTIC VALVE REPLACEMENT (AVR) USING INSPIRIS RESILIA AORTIC VALVE SIZE 25 MM;  Surgeon: Rexene Alberts, MD;  Location: [facility];  Service: Open Heart Surgery;  Laterality: N/A;  . MULTIPLE EXTRACTIONS WITH ALVEOLOPLASTY N/A 07/23/2017   Procedure:  Extraction of tooth #10 with alveoloplasty and gross debridement of remaining teeth;  Surgeon: Lenn Cal, DDS;  Location: [facility];  Service: Oral Surgery;  Laterality: N/A;  . NO PAST SURGERIES    . PACEMAKER IMPLANT N/A 08/28/2017    St Jude Medical Assurity MRI conditional  dual-chamber pacemaker for symptomatic complete heart blockby Dr Rayann Heman  . TEE WITHOUT CARDIOVERSION N/A 07/16/2017   Procedure: TRANSESOPHAGEAL ECHOCARDIOGRAM (TEE);  Surgeon: Lelon Perla, MD;  Location: [facility] ENDOSCOPY;  Service: Cardiovascular;  Laterality: N/A;  . TEE WITHOUT CARDIOVERSION N/A 08/23/2017   Procedure: TRANSESOPHAGEAL ECHOCARDIOGRAM (TEE);  Surgeon: Rexene Alberts, MD;   Location: [facility];  Service: Open Heart Surgery;  Laterality: N/A;  . THORACIC AORTIC ANEURYSM REPAIR N/A 08/23/2017   Procedure: THORACIC ASCENDING ANEURYSM REPAIR (AAA) USING HEMASHIELD GOLD KNITTED MICROVEL DOUBLE VELOUR VASCULAR GRAFT D: 24 MM  L: 30 CM;  Surgeon: Rexene Alberts, MD;  Location: [facility];  Service: Open Heart Surgery;  Laterality: N/A;       Family History  Problem Relation Age of Onset  . Hypertension Mother   . Alzheimer's disease Mother   . Heart attack Brother   . Diabetes Brother     Social History   Tobacco Use  . Smoking status: Former Smoker    Packs/day: 0.50    Types: Cigarettes    Quit date: 08/23/2017    Years since quitting: 1.7  . Smokeless tobacco: Never Used  Substance Use Topics  . Alcohol use: No    Comment: have had alcohol in the past, not heavy  . Drug use: No    Home Medications Prior to Admission medications   Medication Sig Start Date End Date Taking? Authorizing Provider  acetaminophen (TYLENOL) 325 MG tablet Take 650 mg by mouth every 6 (six) hours as needed for mild pain.    [provider]  albuterol (PROVENTIL) (2.5 MG/3ML) 0.083% nebulizer solution INHALE 1 VIAL VIA NEBULIZER EVERY 6 HOURS AS NEEDED FOR FOR WHEEZING OR SHORTNESS OF BREATH 04/24/19   [name], PA-C  albuterol (VENTOLIN HFA) 108 (90 Base) MCG/ACT inhaler Inhale 2 puffs into the lungs every 6 (six) hours as needed for wheezing or shortness of breath. 04/24/19   [name], PA-C  allopurinol (ZYLOPRIM) 300 MG tablet Take 1 tablet (300 mg total) by mouth daily. 04/24/19   [name], PA-C  busPIRone (BUSPAR) 10 MG tablet Take 10 mg by mouth 3 (three) times daily.  12/09/18   [provider]  citalopram (CELEXA) 40 MG tablet Take 40 mg by mouth daily.     [provider]  docusate sodium (COLACE) 100 MG capsule Take 200 mg by mouth daily as needed for mild constipation.     [provider]  furosemide (LASIX) 80 MG tablet  Take 1 tablet (80 mg total) by mouth 2 (two) times daily. 03/11/19   Masoudi, Dorthula Rue, MD  HYDROcodone-acetaminophen (NORCO/VICODIN) 5-325 MG tablet Take 1-2 tablets by mouth every 6 (six) hours as needed. 06/08/19   Volanda Napoleon, PA-C  Krill Oil 350 MG CAPS Take 1 capsule by mouth daily.    [provider]  loratadine (CLARITIN) 10 MG tablet Take 10 mg by mouth daily as needed for allergies.    [provider]  metolazone (ZAROXOLYN) 5 MG tablet Take 1 tablet (5 mg total) by mouth once a week. 05/08/19 08/06/19  Strader, Fransisco Hertz, PA-C  metoprolol tartrate (LOPRESSOR) 100 MG tablet Take 1 tablet (100 mg  total) by mouth 2 (two) times daily. 04/24/19   [name], PA-C  Misc. Devices (BARIATRIC CANE) MISC Use cane for support and stabilization when walking. 06/08/19   Volanda Napoleon, PA-C  mometasone-formoterol (DULERA) 200-5 MCG/ACT AERO INHALE 2 PUFFS BY MOUTH TWICE DAILY. RINSE MOUTH AFTER EACH USE 04/24/19   [name], PA-C  potassium chloride (KLOR-CON) 10 MEQ tablet Take 1-3 tablets (10-30 mEq total) by mouth See admin instructions. Take 3 Tablets (30 mEq totally) by mouth on Monday, Wednesday, Friday and 1 tablet (10 mEq totally) by mouth other days 04/25/19 07/24/19  Ahmed Prima, Fransisco Hertz, PA-C  rivaroxaban (XARELTO) 20 MG TABS tablet TAKE 1 Tablet BY MOUTH ONCE DAILY WITH SUPPER 04/24/19   [name], PA-C    Allergies    Patient has no known allergies.  Review of Systems   Review of Systems  Musculoskeletal:       Knee pain  Neurological: Positive for numbness (toes).  All other systems reviewed and are negative.   Physical Exam Updated Vital Signs BP (!) 127/54   Pulse 74   Temp 98.6 F (37 C) (Oral)   Resp 20   Ht 5\' 11"  (1.803 m)   Wt (!) 222.3 kg   SpO2 99%   BMI 68.34 kg/m   Physical Exam Vitals and nursing note reviewed.  Constitutional:      Appearance: He is well-developed.  HENT:     Head: Normocephalic and  atraumatic.  Eyes:     General: No scleral icterus.       Right eye: No discharge.        Left eye: No discharge.     Conjunctiva/sclera: Conjunctivae normal.  Cardiovascular:     Comments: 2+ DP pulse noted on LLE  Pulmonary:     Effort: Pulmonary effort is normal.  Musculoskeletal:     Comments: Tenderness palpation noted to posterior and lateral aspect of left knee.  No deformity or crepitus noted.  Extension intact.  He does report some pain with flexion.  No bony tenderness noted to left hip, tib-fib, ankle.  Negative anterior and posterior drawer test.  No instability noted on varus or valgus stress.  Skin:    General: Skin is warm and dry.     Comments: Good distal cap refill. LLE is not dusky in appearance or cool to touch.  Neurological:     Mental Status: He is alert.     Comments: He reports some subjective tingling in his left toes but can feel me touching him.  Sensation otherwise intact.  Psychiatric:        Speech: Speech normal.        Behavior: Behavior normal.     ED Results / Procedures / Treatments   Labs (all labs ordered are listed, but only abnormal results are displayed) Labs Reviewed - No data to display  EKG None  Radiology DG Knee Complete 4 Views Left  Result Date: 06/08/2019 CLINICAL DATA:  Knee pain. EXAM: LEFT KNEE - COMPLETE 4+ VIEW COMPARISON:  None. FINDINGS: No evidence of fracture, dislocation, or joint effusion. No evidence of arthropathy or other focal bone abnormality. Soft tissues are unremarkable. IMPRESSION: Negative. Electronically Signed   By: Fidela Salisbury M.D.   On: 06/08/2019 18:15    Procedures Procedures (including critical care time)  Medications Ordered in ED Medications - No data to display  ED Course  I have reviewed the triage vital signs and the nursing notes.  Pertinent labs & imaging  results that were available during my care of the patient were reviewed by me and considered in my medical decision making  (see chart for details).    MDM Rules/Calculators/A&P                      49 year old male who presents for evaluation of left knee pain.  Reports that it began today when he was getting up into his truck.  Since then, he has had pain.  He reports some tingling sensation in his toes. Patient is afebrile, non-toxic appearing, sitting comfortably on examination table. Vital signs reviewed and stable.  Patient with good distal cap refill and pulses.  Concern for fracture versus dislocation versus sprain.  History/physical exam not concerning for ischemic limb, DVT, septic arthritis.  Plan for x-rays.  X-ray reviewed.  Negative for any acute bony abnormality.  Discussed results with patient.  We will put him in a knee brace.  Encouraged at home supportive care measures and follow-up with orthopedics as directed. At this time, patient exhibits no emergent life-threatening condition that require further evaluation in ED or admission. Patient had ample opportunity for questions and discussion. All patient's questions were answered with full understanding. Strict return precautions discussed. Patient expresses understanding and agreement to plan.   Portions of this note were generated with [employer]ist. Dictation errors may occur despite best attempts at proofreading.  Final Clinical Impression(s) / ED Diagnoses Final diagnoses:  Sprain of left knee, unspecified ligament, initial encounter    Rx / DC Orders ED Discharge Orders         Ordered    HYDROcodone-acetaminophen (NORCO/VICODIN) 5-325 MG tablet  Every 6 hours PRN     06/08/19 1842    Misc. Devices (BARIATRIC CANE) MISC  Status:  Discontinued     06/08/19 1914    Misc. Devices (BARIATRIC CANE) MISC     06/08/19 1915           Desma Mcgregor 06/08/19 Mckinley Jewel, MD 06/13/19 1332

## 2019-06-08 NOTE — ED Notes (Signed)
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

## 2019-06-16 ENCOUNTER — Other Ambulatory Visit: Payer: Self-pay | Admitting: Physician Assistant

## 2019-06-16 DIAGNOSIS — R7303 Prediabetes: Secondary | ICD-10-CM

## 2019-06-16 DIAGNOSIS — E781 Pure hyperglyceridemia: Secondary | ICD-10-CM

## 2019-06-16 DIAGNOSIS — I10 Essential (primary) hypertension: Secondary | ICD-10-CM

## 2019-06-18 ENCOUNTER — Other Ambulatory Visit: Payer: Self-pay

## 2019-06-18 ENCOUNTER — Ambulatory Visit: Payer: Self-pay | Admitting: Student

## 2019-06-18 ENCOUNTER — Other Ambulatory Visit (HOSPITAL_COMMUNITY)
Admission: RE | Admit: 2019-06-18 | Discharge: 2019-06-18 | Disposition: A | Discharge status: Home / Self Care | Payer: Self-pay | Source: Ambulatory Visit | Attending: Physician Assistant | Admitting: Physician Assistant

## 2019-06-18 DIAGNOSIS — R7303 Prediabetes: Secondary | ICD-10-CM | POA: Insufficient documentation

## 2019-06-18 DIAGNOSIS — E781 Pure hyperglyceridemia: Secondary | ICD-10-CM | POA: Insufficient documentation

## 2019-06-18 DIAGNOSIS — I10 Essential (primary) hypertension: Secondary | ICD-10-CM | POA: Insufficient documentation

## 2019-06-18 LAB — COMPREHENSIVE METABOLIC PANEL
ALT: 65 U/L — ABNORMAL HIGH (ref 0–44)
AST: 90 U/L — ABNORMAL HIGH (ref 15–41)
Albumin: 3.6 g/dL (ref 3.5–5.0)
Alkaline Phosphatase: 69 U/L (ref 38–126)
Anion gap: 13 (ref 5–15)
BUN: 25 mg/dL — ABNORMAL HIGH (ref 6–20)
CO2: 28 mmol/L (ref 22–32)
Calcium: 9.4 mg/dL (ref 8.9–10.3)
Chloride: 96 mmol/L — ABNORMAL LOW (ref 98–111)
Creatinine, Ser: 0.99 mg/dL (ref 0.61–1.24)
GFR calc Af Amer: >60 mL/min (ref >60)
GFR calc non Af Amer: >60 mL/min (ref >60)
Glucose, Bld: 166 mg/dL — ABNORMAL HIGH (ref 70–99)
Potassium: 3.5 mmol/L (ref 3.5–5.1)
Sodium: 137 mmol/L (ref 135–145)
Total Bilirubin: 0.9 mg/dL (ref 0.3–1.2)
Total Protein: 8.5 g/dL — ABNORMAL HIGH (ref 6.5–8.1)

## 2019-06-18 LAB — HEMOGLOBIN A1C
Hgb A1c MFr Bld: 7.2 % — ABNORMAL HIGH (ref 4.8–5.6)
Mean Plasma Glucose: 159.94 mg/dL

## 2019-06-18 LAB — LIPID PANEL
Cholesterol: 179 mg/dL (ref 0–200)
HDL: 28 mg/dL — ABNORMAL LOW (ref >40)
LDL Cholesterol: 87 mg/dL (ref 0–99)
Total CHOL/HDL Ratio: 6.4 RATIO
Triglycerides: 320 mg/dL — ABNORMAL HIGH (ref <150)
VLDL: 64 mg/dL — ABNORMAL HIGH (ref 0–40)

## 2019-06-19 ENCOUNTER — Encounter: Payer: Self-pay | Admitting: Student

## 2019-06-19 ENCOUNTER — Ambulatory Visit (INDEPENDENT_AMBULATORY_CARE_PROVIDER_SITE_OTHER): Payer: Self-pay | Admitting: Student

## 2019-06-19 VITALS — BP 132/82 | HR 71 | Temp 99.7°F | Ht 71.0 in | Wt >= 6400 oz

## 2019-06-19 DIAGNOSIS — G4733 Obstructive sleep apnea (adult) (pediatric): Secondary | ICD-10-CM

## 2019-06-19 DIAGNOSIS — Z953 Presence of xenogenic heart valve: Secondary | ICD-10-CM

## 2019-06-19 DIAGNOSIS — Z950 Presence of cardiac pacemaker: Secondary | ICD-10-CM

## 2019-06-19 DIAGNOSIS — I10 Essential (primary) hypertension: Secondary | ICD-10-CM

## 2019-06-19 DIAGNOSIS — I5032 Chronic diastolic (congestive) heart failure: Secondary | ICD-10-CM

## 2019-06-19 DIAGNOSIS — I480 Paroxysmal atrial fibrillation: Secondary | ICD-10-CM

## 2019-06-19 MED ORDER — METOLAZONE 5 MG PO TABS: 5.0000 mg | ORAL_TABLET | ORAL | 1 refills | Status: DC

## 2019-06-19 NOTE — Patient Instructions (Signed)
Medication Instructions:  Your physician recommends that you continue on your current medications as directed. Please refer to the Current Medication list given to you today..  *If you need a refill on your cardiac medications before your next appointment, please call your pharmacy*  Lab Work: None If you have labs (blood work) drawn today and your tests are completely normal, you will receive your results only by: Marland Kitchen MyChart Message (if you have MyChart) OR . A paper copy in the mail If you have any lab test that is abnormal or we need to change your treatment, we will call you to review the results.  Testing/Procedures: None  Follow-Up: At [facility], you and your health needs are our priority.  As part of our continuing mission to provide you with exceptional heart care, we have created designated Provider Care Teams.  These Care Teams include your primary Cardiologist (physician) and Advanced Practice Providers (APPs -  Physician Assistants and Nurse Practitioners) who all work together to provide you with the care you need, when you need it.  Your next appointment:   3 month(s)  The format for your next appointment:   In Person  Provider:   You may see Kate Sable, MD or one of the following Advanced Practice Providers on your designated Care Team:    Bernerd Pho, PA-C       Other Instructions None     Thank you for choosing [facility] !

## 2019-06-19 NOTE — Progress Notes (Signed)
Cardiology Office Note    Date:  06/19/2019   ID:  Reginald [name], DOB [birth year], MRN [id number]  PCP:  [name], PA-C  Cardiologist: Kate Sable, MD    Chief Complaint  Patient presents with  . Follow-up    History of Present Illness:    Reginald [name] is a 49 y.o. male with past medical history of severe AS (s/p bovine tissue AVR in 08/2017 with repair of ascending thoracic aortic aneurysm), normal coronary arteries by cardiac catheterization in 2019, paroxysmal atrial fibrillation, CHB (s/p PPM placement in 08/2017, history of bilateral PE, HTN, morbid obesity and OSA who presents to the office today for 96-month follow-up.  He was last examined by myself in 03/2019 following an admission at [facility] in 02/2019 for pyelonephritis and recurrent admission at [facility] later that month for hypotension and AKI.  He was overall doing well at the time of his visit and reported still having dyspnea on exertion but denied any associated chest pain or palpitations. Weight was at 476 lbs on the office scales at that time. He reported worsening dizziness with positional changes and Metolazone was discontinued and he was continued on Lasix 80 mg twice daily (previously unable to afford Torsemide).  He presented to the office for a weight check on 04/24/2019 and this had increased to 484 lbs with associated swelling along his abdomen, therefore he was restarted on Metolazone 5mg  Monday's and Friday's. Dosing was reduced in the interim given a rise in creatinine but weight was up to 494 lbs on 05/19/2019 and he was informed to take an extra Metolazone that week and then continue once weekly dosing.   He did have repeat labs on 06/18/2019 and electrolytes were stable with creatinine at 0.99.   In talking with the patient today, he reports having chronic dyspnea but no acute changes. Lower extremity edema has overall been stable but he has noticed worsening abdominal distension. No chest  pain. Experiences intermittent palpitations but HR has been well-controlled. Still having dizziness with positional changes. No orthopnea or PND. Uses his CPAP nightly.   Reports consuming 6-8 bottles of water daily as he is constantly thirsty. He has been trying to watch his diet and was disappointed his Hgb A1c had trended up when most recent checked.    Past Medical History:  Diagnosis Date  . Anxiety   . Aortic stenosis   . Arthritis   . Asthma   . Back pain   . Cellulitis of skin with lymphangitis   . CHF (congestive heart failure) ([place])   . Chronic diastolic congestive heart failure ([place])   . Chronic venous insufficiency   . Constipation   . COPD (chronic obstructive pulmonary disease) ([place])   . Depression   . Dyspnea   . Essential hypertension   . Gout   . Heart murmur   . Hypertension   . Morbid obesity ([place])   . Obesity   . Pneumonia    walking pneumonia  . Pre-diabetes   . Prostatitis   . Pulmonary embolism ([place])   . Pulmonary hypertension ([place])   . Pyelonephritis   . S/P aortic valve replacement with bioprosthetic valve 08/23/2017   25 mm Edwards Inspiris Resilia stented bovine pericardial tissue valve  . 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 Sleep apnea    cpap   . Thoracic ascending aortic aneurysm ([place])   . Thoracic ascending aortic aneurysm ([place])   .  Tobacco abuse     Past Surgical History:  Procedure Laterality Date  . AORTIC VALVE REPLACEMENT N/A 08/23/2017   Procedure: AORTIC VALVE REPLACEMENT (AVR) USING INSPIRIS RESILIA AORTIC VALVE SIZE 25 MM;  Surgeon: Rexene Alberts, MD;  Location: [place];  Service: Open Heart Surgery;  Laterality: N/A;  . MULTIPLE EXTRACTIONS WITH ALVEOLOPLASTY N/A 07/23/2017   Procedure: Extraction of tooth #10 with alveoloplasty and gross debridement of remaining teeth;  Surgeon: Lenn Cal, DDS;  Location: [place];  Service: Oral Surgery;  Laterality: N/A;  . NO PAST  SURGERIES    . PACEMAKER IMPLANT N/A 08/28/2017    St Jude Medical Assurity MRI conditional  dual-chamber pacemaker for symptomatic complete heart blockby Dr Rayann Heman  . TEE WITHOUT CARDIOVERSION N/A 07/16/2017   Procedure: TRANSESOPHAGEAL ECHOCARDIOGRAM (TEE);  Surgeon: Lelon Perla, MD;  Location: [facility] ENDOSCOPY;  Service: Cardiovascular;  Laterality: N/A;  . TEE WITHOUT CARDIOVERSION N/A 08/23/2017   Procedure: TRANSESOPHAGEAL ECHOCARDIOGRAM (TEE);  Surgeon: Rexene Alberts, MD;  Location: [place];  Service: Open Heart Surgery;  Laterality: N/A;  . THORACIC AORTIC ANEURYSM REPAIR N/A 08/23/2017   Procedure: THORACIC ASCENDING ANEURYSM REPAIR (AAA) USING HEMASHIELD GOLD KNITTED MICROVEL DOUBLE VELOUR VASCULAR GRAFT D: 24 MM  L: 30 CM;  Surgeon: Rexene Alberts, MD;  Location: [place];  Service: Open Heart Surgery;  Laterality: N/A;    Current Medications: Outpatient Medications Prior to Visit  Medication Sig Dispense Refill  . acetaminophen (TYLENOL) 325 MG tablet Take 650 mg by mouth every 6 (six) hours as needed for mild pain.    Marland Kitchen albuterol (PROVENTIL) (2.5 MG/3ML) 0.083% nebulizer solution INHALE 1 VIAL VIA NEBULIZER EVERY 6 HOURS AS NEEDED FOR FOR WHEEZING OR SHORTNESS OF BREATH 270 mL 0  . albuterol (VENTOLIN HFA) 108 (90 Base) MCG/ACT inhaler Inhale 2 puffs into the lungs every 6 (six) hours as needed for wheezing or shortness of breath. 6 g 1  . allopurinol (ZYLOPRIM) 300 MG tablet Take 1 tablet (300 mg total) by mouth daily. 90 tablet 1  . busPIRone (BUSPAR) 10 MG tablet Take 10 mg by mouth 3 (three) times daily.     . citalopram (CELEXA) 40 MG tablet Take 40 mg by mouth daily.     Marland Kitchen docusate sodium (COLACE) 100 MG capsule Take 200 mg by mouth daily as needed for mild constipation.     . furosemide (LASIX) 80 MG tablet Take 1 tablet (80 mg total) by mouth 2 (two) times daily. 180 tablet 3  . HYDROcodone-acetaminophen (NORCO/VICODIN) 5-325 MG tablet Take 1-2 tablets by mouth every 6 (six)  hours as needed. 6 tablet 0  . Krill Oil 350 MG CAPS Take 1 capsule by mouth daily.    Marland Kitchen loratadine (CLARITIN) 10 MG tablet Take 10 mg by mouth daily as needed for allergies.    . metoprolol tartrate (LOPRESSOR) 100 MG tablet Take 1 tablet (100 mg total) by mouth 2 (two) times daily. 180 tablet 0  . Misc. Devices (BARIATRIC CANE) MISC Use cane for support and stabilization when walking. 1 each 0  . mometasone-formoterol (DULERA) 200-5 MCG/ACT AERO INHALE 2 PUFFS BY MOUTH TWICE DAILY. RINSE MOUTH AFTER EACH USE 39 g 1  . potassium chloride (KLOR-CON) 10 MEQ tablet Take 1-3 tablets (10-30 mEq total) by mouth See admin instructions. Take 3 Tablets (30 mEq totally) by mouth on Monday, Wednesday, Friday and 1 tablet (10 mEq totally) by mouth other days 55 tablet 3  . rivaroxaban (XARELTO) 20  MG TABS tablet TAKE 1 Tablet BY MOUTH ONCE DAILY WITH SUPPER 90 tablet 0  . metolazone (ZAROXOLYN) 5 MG tablet Take 1 tablet (5 mg total) by mouth once a week. 5 tablet 11   No facility-administered medications prior to visit.     Allergies:   Patient has no known allergies.   Social History   Socioeconomic History  . Marital status: Divorced    Spouse name: Not on file  . Number of children: Not on file  . Years of education: Not on file  . Highest education level: Not on file  Occupational History  . Not on file  Tobacco Use  . Smoking status: Former Smoker    Packs/day: 0.50    Types: Cigarettes    Quit date: 08/23/2017    Years since quitting: 1.8  . Smokeless tobacco: Never Used  Substance and Sexual Activity  . Alcohol use: No    Comment: have had alcohol in the past, not heavy  . Drug use: No  . Sexual activity: Not on file  Other Topics Concern  . Not on file  Social History Narrative  . Not on file   Social Determinants of Health   Financial Resource Strain:   . Difficulty of Paying Living Expenses: Not on file  Food Insecurity:   . Worried About Charity fundraiser in the Last  Year: Not on file  . Ran Out of Food in the Last Year: Not on file  Transportation Needs:   . Lack of Transportation (Medical): Not on file  . Lack of Transportation (Non-Medical): Not on file  Physical Activity:   . Days of Exercise per Week: Not on file  . Minutes of Exercise per Session: Not on file  Stress:   . Feeling of Stress : Not on file  Social Connections:   . Frequency of Communication with Friends and Family: Not on file  . Frequency of Social Gatherings with Friends and Family: Not on file  . Attends Religious Services: Not on file  . Active Member of Clubs or Organizations: Not on file  . Attends Archivist Meetings: Not on file  . Marital Status: Not on file     Family History:  [name] family history includes Alzheimer's disease in his mother; Diabetes in his brother; Heart attack in his brother; Hypertension in his mother.   Review of Systems:   Please see the history of present illness.     General:  No chills, fever, night sweats or weight changes.  Cardiovascular:  No chest pain, edema, orthopnea, paroxysmal nocturnal dyspnea. Positive for dyspnea on exertion and abdominal distension.  Dermatological: No rash, lesions/masses Respiratory: No cough, dyspnea Urologic: No hematuria, dysuria Abdominal:   No nausea, vomiting, diarrhea, bright red blood per rectum, melena, or hematemesis Neurologic:  No visual changes, wkns, changes in mental status. All other systems reviewed and are otherwise negative except as noted above.   Physical Exam:    VS:  BP 132/82   Pulse 71   Temp 99.7 F (37.6 C)   Ht 5\' 11"  (1.803 m)   Wt (!) 491 lb (222.7 kg)   SpO2 97%   BMI 68.48 kg/m    General: Well developed, obese male appearing in no acute distress. Head: Normocephalic, atraumatic, sclera non-icteric, no xanthomas, nares are without discharge.  Neck: No carotid bruits. JVD difficult to assess.  Lungs: Respirations regular and unlabored, without  wheezes or rales.  Heart: Regular rate and rhythm.  No S3 or S4.  No murmur, no rubs, or gallops appreciated. Abdomen: Soft, non-tender, non-distended with normoactive bowel sounds. No hepatomegaly. No rebound/guarding. No obvious abdominal masses. Msk:  Strength and tone appear normal for age. No joint deformities or effusions. Extremities: No clubbing or cyanosis. Trace ankle edema bilaterally.  Distal pedal pulses are 2+ bilaterally. Neuro: Alert and oriented X 3. Moves all extremities spontaneously. No focal deficits noted. Psych:  Responds to questions appropriately with a normal affect. Skin: No rashes or lesions noted  Wt Readings from Last 3 Encounters:  06/19/19 (!) 491 lb (222.7 kg)  06/08/19 (!) 490 lb (222.3 kg)  05/19/19 (!) 494 lb (224.1 kg)     Studies/Labs Reviewed:   EKG:  EKG is not ordered today.   Recent Labs: 03/08/2019: TSH 3.834 03/15/2019: B Natriuretic Peptide 40.0 03/17/2019: Magnesium 2.3 03/24/2019: Hemoglobin 13.1; Platelets 331 06/18/2019: ALT 65; BUN 25; Creatinine, Ser 0.99; Potassium 3.5; Sodium 137   Lipid Panel    Component Value Date/Time   CHOL 179 06/18/2019 0915   TRIG 320 (H) 06/18/2019 0915   HDL 28 (L) 06/18/2019 0915   CHOLHDL 6.4 06/18/2019 0915   VLDL 64 (H) 06/18/2019 0915   LDLCALC 87 06/18/2019 0915    Additional studies/ records that were reviewed today include:   Cardiac Catheterization: 06/2017 1. No angiographic evidence of CAD 2. Severe aortic stenosis by TEE/TTE.  Due to turbulent flow in the ascending aorta and movement of the catheters, I could not cross the valve. The patient had a TEE this am that demonstrated severe AS. I did not feel that further attempts at crossing the valve would provide any clinical data that would change his treatment plan, though more attempts increased the risk of procedure complication. 3. Normal right heart pressures  Recommendations: Will continue planning for AVR vs TAVR. Pt to f/u with  Dr. Roxy Manns after CT scans.   Echocardiogram: 02/2019 IMPRESSIONS    1. Left ventricular ejection fraction, by visual estimation, is 60 to 65%. The left ventricle has normal function. Normal left ventricular size. There is no left ventricular hypertrophy.  2. Global right ventricle has mildly reduced systolic function.The right ventricular size is not well visualized. No increase in right ventricular wall thickness.  3. Left atrial size was not well visualized.  4. Right atrial size was not well visualized.  5. The mitral valve was not well visualized. No evidence of mitral valve regurgitation. No evidence of mitral stenosis.  6. The tricuspid valve is not well visualized. Tricuspid valve regurgitation is trivial.  7. Poorly visualized Bioprosthetic aortic valve valve is present in the aortic position.  8. Pulmonic valve poorly visualized.  9. S/P Ascending thoraici aortic aneurysm repair. Aortic root and ascending aorta poorly visualized. 10. Mildly elevated pulmonary artery systolic pressure.   Assessment:    1. Chronic heart failure with preserved ejection fraction (HFpEF) ([place])   2. PAF (paroxysmal atrial fibrillation) ([place])   3. 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   4. Cardiac pacemaker in situ   5. Essential hypertension   6. OSA (obstructive sleep apnea)      Plan:   In order of problems listed above:  1. Chronic Diastolic CHF - weight has overall been stable since his most recent check, actually declining by 3 lbs. No orthopnea or PND. Will continue current medication regimen with Lasix 80mg  BID and Metolazone 5mg  once weekly with instructions to take an additional tablet on  Friday's if worsening abdominal distension or edema. Renal function and K+ stable by most recent labs.   2. Paroxysmal Atrial Fibrillation - he is maintaining NSR by examination today. He does experience intermittent palpitations but denies  any persistent symptoms. Continue Lopressor 100mg  BID for rate-control. - no evidence of active bleeding. Continue Xarelto for anticoagulation.   3. Aortic Stenosis - s/p bovine tissue AVR in 08/2017 with repair of ascending thoracic aortic aneurysm. Echocardiogram in 02/2019 showed normal valve function but views were limited.   4. CHB  - s/p PPM placement in 08/2017. Device interrogation earlier this month showed normal device function.   5. HTN - BP well-controlled at 132/82 during today's visit. Continue current medication regimen.   6. OSA - continued compliance with CPAP encouraged.   Medication Adjustments/Labs and Tests Ordered: Current medicines are reviewed at length with the patient today.  Concerns regarding medicines are outlined above.  Medication changes, Labs and Tests ordered today are listed in the Patient Instructions below. Patient Instructions  Medication Instructions:  Your physician recommends that you continue on your current medications as directed. Please refer to the Current Medication list given to you today..  *If you need a refill on your cardiac medications before your next appointment, please call your pharmacy*  Lab Work: None If you have labs (blood work) drawn today and your tests are completely normal, you will receive your results only by: Marland Kitchen MyChart Message (if you have MyChart) OR . A paper copy in the mail If you have any lab test that is abnormal or we need to change your treatment, we will call you to review the results.  Testing/Procedures: None  Follow-Up: At [facility], you and your health needs are our priority.  As part of our continuing mission to provide you with exceptional heart care, we have created designated Provider Care Teams.  These Care Teams include your primary Cardiologist (physician) and Advanced Practice Providers (APPs -  Physician Assistants and Nurse Practitioners) who all work together to provide you with the care  you need, when you need it.  Your next appointment:   3 month(s)  The format for your next appointment:   In Person  Provider:   You may see Kate Sable, MD or one of the following Advanced Practice Providers on your designated Care Team:    Bernerd Pho, PA-C       Other Instructions None     Thank you for choosing [facility] !            Signed, Erma Heritage, PA-C  06/19/2019 7:24 PM    [place]. [address] [place], [place] [address] Phone: [phone] Fax: [phone]

## 2019-06-24 ENCOUNTER — Ambulatory Visit: Payer: Self-pay | Admitting: Physician Assistant

## 2019-06-24 ENCOUNTER — Encounter: Payer: Self-pay | Admitting: Physician Assistant

## 2019-06-24 ENCOUNTER — Other Ambulatory Visit: Payer: Self-pay | Admitting: Physician Assistant

## 2019-06-24 ENCOUNTER — Other Ambulatory Visit: Payer: Self-pay

## 2019-06-24 VITALS — BP 110/70 | HR 69 | Temp 97.5°F

## 2019-06-24 DIAGNOSIS — M25562 Pain in left knee: Secondary | ICD-10-CM

## 2019-06-24 DIAGNOSIS — I5032 Chronic diastolic (congestive) heart failure: Secondary | ICD-10-CM

## 2019-06-24 DIAGNOSIS — G8929 Other chronic pain: Secondary | ICD-10-CM

## 2019-06-24 DIAGNOSIS — Z953 Presence of xenogenic heart valve: Secondary | ICD-10-CM

## 2019-06-24 DIAGNOSIS — Z7901 Long term (current) use of anticoagulants: Secondary | ICD-10-CM

## 2019-06-24 DIAGNOSIS — E119 Type 2 diabetes mellitus without complications: Secondary | ICD-10-CM

## 2019-06-24 DIAGNOSIS — I10 Essential (primary) hypertension: Secondary | ICD-10-CM

## 2019-06-24 DIAGNOSIS — E781 Pure hyperglyceridemia: Secondary | ICD-10-CM

## 2019-06-24 MED ORDER — ICOSAPENT ETHYL 1 G PO CAPS: 2.0000 g | ORAL_CAPSULE | Freq: Every day | ORAL | 0 refills | Status: DC

## 2019-06-24 MED ORDER — METFORMIN HCL 500 MG PO TABS: 500.0000 mg | ORAL_TABLET | Freq: Two times a day (BID) | ORAL | 1 refills | Status: DC

## 2019-06-24 NOTE — Patient Instructions (Signed)

## 2019-06-24 NOTE — Progress Notes (Signed)
BP 110/70   Pulse 69   Temp (!) 97.5 F (36.4 C)   SpO2 95%    Subjective:    Patient ID: Reginald [name], male    DOB: [birth year], 49 y.o.   MRN: [id number]  HPI: Reginald [name] is a 49 y.o. male presenting on 06/24/2019 for No chief complaint on file.   HPI   Pt had a negative covid 19 screening questionnaire.     Pt is pleasant 26yoM with super moribd obesity, DM, CHF, PAF, AS, HTN, OSA, and mood disorder.    Pt sees cardiology regularly for management of heart conditions.    Pt complains of having problems with L knee and both shoulders today.  No recent injury.      Relevant past medical, surgical, family and social history reviewed and updated as indicated. Interim medical history since our last visit reviewed. Allergies and medications reviewed and updated.   Current Outpatient Medications:  .  acetaminophen (TYLENOL) 325 MG tablet, Take 650 mg by mouth every 6 (six) hours as needed for mild pain., Disp: , Rfl:  .  albuterol (PROVENTIL) (2.5 MG/3ML) 0.083% nebulizer solution, INHALE 1 VIAL VIA NEBULIZER EVERY 6 HOURS AS NEEDED FOR FOR WHEEZING OR SHORTNESS OF BREATH, Disp: 270 mL, Rfl: 0 .  albuterol (VENTOLIN HFA) 108 (90 Base) MCG/ACT inhaler, Inhale 2 puffs into the lungs every 6 (six) hours as needed for wheezing or shortness of breath., Disp: 6 g, Rfl: 1 .  allopurinol (ZYLOPRIM) 300 MG tablet, Take 1 tablet (300 mg total) by mouth daily., Disp: 90 tablet, Rfl: 1 .  busPIRone (BUSPAR) 10 MG tablet, Take 10 mg by mouth 3 (three) times daily. , Disp: , Rfl:  .  citalopram (CELEXA) 40 MG tablet, Take 40 mg by mouth daily. , Disp: , Rfl:  .  docusate sodium (COLACE) 100 MG capsule, Take 200 mg by mouth daily as needed for mild constipation. , Disp: , Rfl:  .  furosemide (LASIX) 80 MG tablet, Take 1 tablet (80 mg total) by mouth 2 (two) times daily., Disp: 180 tablet, Rfl: 3 .  HYDROcodone-acetaminophen (NORCO/VICODIN) 5-325 MG tablet, Take 1-2 tablets by mouth every 6  (six) hours as needed., Disp: 6 tablet, Rfl: 0 .  Krill Oil 350 MG CAPS, Take 1 capsule by mouth daily., Disp: , Rfl:  .  loratadine (CLARITIN) 10 MG tablet, Take 10 mg by mouth daily as needed for allergies., Disp: , Rfl:  .  metolazone (ZAROXOLYN) 5 MG tablet, Take 1 tablet (5 mg total) by mouth 2 (two) times a week. (Patient taking differently: Take 5 mg by mouth once a week. ), Disp: 30 tablet, Rfl: 1 .  metoprolol tartrate (LOPRESSOR) 100 MG tablet, Take 1 tablet (100 mg total) by mouth 2 (two) times daily., Disp: 180 tablet, Rfl: 0 .  Misc. Devices (BARIATRIC CANE) MISC, Use cane for support and stabilization when walking., Disp: 1 each, Rfl: 0 .  mometasone-formoterol (DULERA) 200-5 MCG/ACT AERO, INHALE 2 PUFFS BY MOUTH TWICE DAILY. RINSE MOUTH AFTER EACH USE, Disp: 39 g, Rfl: 1 .  potassium chloride (KLOR-CON) 10 MEQ tablet, Take 1-3 tablets (10-30 mEq total) by mouth See admin instructions. Take 3 Tablets (30 mEq totally) by mouth on Monday, Wednesday, Friday and 1 tablet (10 mEq totally) by mouth other days, Disp: 55 tablet, Rfl: 3 .  rivaroxaban (XARELTO) 20 MG TABS tablet, TAKE 1 Tablet BY MOUTH ONCE DAILY WITH SUPPER, Disp: 90 tablet, Rfl: 0  Review of Systems  Per HPI unless specifically indicated above     Objective:    BP 110/70   Pulse 69   Temp (!) 97.5 F (36.4 C)   SpO2 95%   Wt Readings from Last 3 Encounters:  06/19/19 (!) 491 lb (222.7 kg)  06/08/19 (!) 490 lb (222.3 kg)  05/19/19 (!) 494 lb (224.1 kg)    Physical Exam Vitals reviewed.  Constitutional:      General: He is not in acute distress.    Appearance: He is well-developed. He is obese. He is not ill-appearing.  HENT:     Head: Normocephalic and atraumatic.  Cardiovascular:     Rate and Rhythm: Normal rate and regular rhythm.  Pulmonary:     Effort: Pulmonary effort is normal.     Breath sounds: Normal breath sounds. No wheezing.  Abdominal:     General: Bowel sounds are normal.      Palpations: Abdomen is soft.     Tenderness: There is no abdominal tenderness.  Musculoskeletal:     Right shoulder: Tenderness present. No deformity. Decreased range of motion.     Left shoulder: Tenderness present. No deformity. Decreased range of motion.     Cervical back: Neck supple.     Left knee: No erythema or ecchymosis.     Comments: Flexes/extends left knee.  Pain both shoulders with abduction past 120 degrees with mild non-point tenderness.    Exam limited by body habitus  Lymphadenopathy:     Cervical: No cervical adenopathy.  Skin:    General: Skin is warm and dry.  Neurological:     Mental Status: He is alert and oriented to person, place, and time.  Psychiatric:        Behavior: Behavior normal.     Results for orders placed or performed during the hospital encounter of 06/18/19  Hemoglobin A1c  Result Value Ref Range   Hgb A1c MFr Bld 7.2 (H) 4.8 - 5.6 %   Mean Plasma Glucose 159.94 mg/dL  Lipid panel  Result Value Ref Range   Cholesterol 179 0 - 200 mg/dL   Triglycerides 320 (H) <150 mg/dL   HDL 28 (L) >40 mg/dL   Total CHOL/HDL Ratio 6.4 RATIO   VLDL 64 (H) 0 - 40 mg/dL   LDL Cholesterol 87 0 - 99 mg/dL  Comprehensive metabolic panel  Result Value Ref Range   Sodium 137 135 - 145 mmol/L   Potassium 3.5 3.5 - 5.1 mmol/L   Chloride 96 (L) 98 - 111 mmol/L   CO2 28 22 - 32 mmol/L   Glucose, Bld 166 (H) 70 - 99 mg/dL   BUN 25 (H) 6 - 20 mg/dL   Creatinine, Ser 0.99 0.61 - 1.24 mg/dL   Calcium 9.4 8.9 - 10.3 mg/dL   Total Protein 8.5 (H) 6.5 - 8.1 g/dL   Albumin 3.6 3.5 - 5.0 g/dL   AST 90 (H) 15 - 41 U/L   ALT 65 (H) 0 - 44 U/L   Alkaline Phosphatase 69 38 - 126 U/L   Total Bilirubin 0.9 0.3 - 1.2 mg/dL   GFR calc non Af Amer >60 >60 mL/min   GFR calc Af Amer >60 >60 mL/min   Anion gap 13 5 - 15      Assessment & Plan:   Encounter Diagnoses  Name Primary?  . Diabetes mellitus without complication ([place]) Yes  . Essential hypertension   .  Hypertriglyceridemia   . Super-super obese ([place])   .  Anticoagulant long-term use   . 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   . Chronic heart failure with preserved ejection fraction (HFpEF) ([place])   . Chronic pain of left knee   . Chronic pain of both shoulders         -reviewed labs with pt -pt to continue fish oil 1 daily and vascepa in the evening (had samples of vascepa that were given to pt) -pt is couseled on DM .  rx metformin. -pt to continue with cardiology per their recomendation -will Refer to orthopedics for L knee and shoulders- pt has CAFA.  He is aware that his weight play large role in joint problems and he is encouraged to reduce his weight.   -will refer for annual DM eye exam -pt Dm foot exam done today. -pt to F/u 1 mo EV to make sure he is tolerating new meds.  He is to contact office sooner prn

## 2019-06-26 ENCOUNTER — Other Ambulatory Visit: Payer: Self-pay | Admitting: Physician Assistant

## 2019-06-26 DIAGNOSIS — E119 Type 2 diabetes mellitus without complications: Secondary | ICD-10-CM

## 2019-06-30 ENCOUNTER — Encounter: Payer: Self-pay | Admitting: Nutrition

## 2019-06-30 ENCOUNTER — Other Ambulatory Visit: Payer: Self-pay

## 2019-06-30 ENCOUNTER — Encounter: Payer: Self-pay | Attending: Physician Assistant | Admitting: Nutrition

## 2019-06-30 DIAGNOSIS — E1165 Type 2 diabetes mellitus with hyperglycemia: Secondary | ICD-10-CM | POA: Insufficient documentation

## 2019-06-30 DIAGNOSIS — Z6841 Body Mass Index (BMI) 40.0 and over, adult: Secondary | ICD-10-CM | POA: Insufficient documentation

## 2019-06-30 DIAGNOSIS — I10 Essential (primary) hypertension: Secondary | ICD-10-CM | POA: Insufficient documentation

## 2019-06-30 DIAGNOSIS — E782 Mixed hyperlipidemia: Secondary | ICD-10-CM | POA: Insufficient documentation

## 2019-06-30 DIAGNOSIS — IMO0002 Reserved for concepts with insufficient information to code with codable children: Secondary | ICD-10-CM

## 2019-06-30 DIAGNOSIS — E118 Type 2 diabetes mellitus with unspecified complications: Secondary | ICD-10-CM | POA: Insufficient documentation

## 2019-06-30 DIAGNOSIS — I5032 Chronic diastolic (congestive) heart failure: Secondary | ICD-10-CM | POA: Insufficient documentation

## 2019-06-30 NOTE — Progress Notes (Signed)
  Medical Nutrition Therapy:  Appt start time: 0830 and time:  900  Assessment:  Primary concerns today:  Morbid Obesity. Seen at the  [facility].  Now DM Type 2. Now a diabetic.  Started Metformin . Not testing bloods sugars due to no insurance. Hurt his knee a few weeks ago. He notes his TG were elevated. He was drinking a lot of FF milk recently. Has cut out juices now.  Wt is up 4 lbs. Has been eating more salads. Adding boiled chicken and fish. Has CPAP machine. Has cut out country ham. Has cut out sodas. He is limited mobility due to severe obesity and shortness of breath. He is willing to try some chair exercises on uTube.  Lab Results  Component Value Date   HGBA1C 7.2 (H) 06/18/2019     CMP Latest Ref Rng & Units 06/18/2019 05/19/2019 05/01/2019  Glucose 70 - 99 mg/dL 166(H) 204(H) 203(H)  BUN 6 - 20 mg/dL 25(H) 19 30(H)  Creatinine 0.61 - 1.24 mg/dL 0.99 0.99 1.42(H)  Sodium 135 - 145 mmol/L 137 139 135  Potassium 3.5 - 5.1 mmol/L 3.5 3.4(L) 2.8(L)  Chloride 98 - 111 mmol/L 96(L) 99 86(L)  CO2 22 - 32 mmol/L 28 25 33(H)  Calcium 8.9 - 10.3 mg/dL 9.4 8.8(L) 8.8(L)  Total Protein 6.5 - 8.1 g/dL 8.5(H) - -  Total Bilirubin 0.3 - 1.2 mg/dL 0.9 - -  Alkaline Phos 38 - 126 U/L 69 - -  AST 15 - 41 U/L 90(H) - -  ALT 0 - 44 U/L 65(H) - -   Lipid Panel     Component Value Date/Time   CHOL 179 06/18/2019 0915   TRIG 320 (H) 06/18/2019 0915   HDL 28 (L) 06/18/2019 0915   CHOLHDL 6.4 06/18/2019 0915   VLDL 64 (H) 06/18/2019 0915   LDLCALC 87 06/18/2019 0915     Preferred Learning Style:   No preference indicated   Learning Readiness:   Ready  Change in progress   MEDICATIONS:    DIETARY INTAKE: 24-hr recall:  B ( AM):  BTL sandwich, water L ( PM): Rice, chicken, cabbage, water D same as lunch, water   Beverages:  Usual physical activity:ADL  Estimated energy needs: 1600  calories 180 g carbohydrates 120 g protein 44 g fat  Progress Towards  Goal(s):  In progress.   Nutritional Diagnosis:  NI-1.5 Excessive energy intake As related to high calorie high fat high salt diet.  As evidenced by BMI 66 .    Intervention:  Nutrition and prediabetes education provided on My Plate, CHO counting, meal planning, portion sizes, timing of meals, avoiding snacks between meals  taking medications as prescribed, benefits of exercising 30 minutes per day and prevention of  DM. Potassium rich foods. Healthy weight loss habits. Low sodium high fiber diet.    Goals   Goals Try UTUBE videos and exerise 10+ minutes three times a week.  Eat meal on times Watch portions Increase fresh fruits and veggies. Lose 10 lbs in 3 months. Get A1C to 6.5% or less.   Teaching Method Utilized:  Visual Auditory Hands on  Handouts given during visit include:  The Plate Method   Meal Plan Card   Barriers to learning/adherence to lifestyle change: Morbid obesity and no income or job  Demonstrated degree of understanding via:  Teach Back   Monitoring/Evaluation:  Dietary intake, exercise, meal plannign , and body weight in 3 month(s).

## 2019-06-30 NOTE — Patient Instructions (Addendum)
Goals Try UTUBE videos and exerise 10+ minutes three times a week.  Eat meal on times Watch portions Increase fresh fruits and veggies. Lose 10 lbs in 3 months. Get A1C to 6.5% or less.

## 2019-07-08 ENCOUNTER — Other Ambulatory Visit: Payer: Self-pay

## 2019-07-08 ENCOUNTER — Ambulatory Visit (INDEPENDENT_AMBULATORY_CARE_PROVIDER_SITE_OTHER): Payer: Self-pay | Admitting: Orthopaedic Surgery

## 2019-07-08 ENCOUNTER — Encounter: Payer: Self-pay | Admitting: Orthopaedic Surgery

## 2019-07-08 VITALS — Temp 97.3°F | Ht 71.0 in | Wt >= 6400 oz

## 2019-07-08 DIAGNOSIS — G8929 Other chronic pain: Secondary | ICD-10-CM

## 2019-07-08 DIAGNOSIS — Z6841 Body Mass Index (BMI) 40.0 and over, adult: Secondary | ICD-10-CM

## 2019-07-08 DIAGNOSIS — Z950 Presence of cardiac pacemaker: Secondary | ICD-10-CM

## 2019-07-08 DIAGNOSIS — Z9989 Dependence on other enabling machines and devices: Secondary | ICD-10-CM | POA: Insufficient documentation

## 2019-07-08 DIAGNOSIS — I872 Venous insufficiency (chronic) (peripheral): Secondary | ICD-10-CM

## 2019-07-08 DIAGNOSIS — G4730 Sleep apnea, unspecified: Secondary | ICD-10-CM

## 2019-07-08 DIAGNOSIS — I272 Pulmonary hypertension, unspecified: Secondary | ICD-10-CM

## 2019-07-08 DIAGNOSIS — G4733 Obstructive sleep apnea (adult) (pediatric): Secondary | ICD-10-CM | POA: Insufficient documentation

## 2019-07-08 DIAGNOSIS — M25562 Pain in left knee: Secondary | ICD-10-CM

## 2019-07-08 NOTE — Progress Notes (Signed)
Subjective:    Patient ID: Reginald [name], male    DOB: 1970-07-25, 49 y.o.   MRN: [id number]  HPI He has had pain and swelling and popping of the left knee for a while. About four to six weeks ago he had a loud pop and more pain.  He went to the ER on 06-08-2019 and had x-rays which were negative.  He then saw Dr. Percell Miller in [place] a few days later.  He was given intra-articular injection of the left knee which helped some.  He has no insurance.  He is on [employer] now and wanted to be seen here.  He has very complicated medical history with marked morbid obesity, pulmonary hypertension, post pulmonary embolus, on anticoagulation, chronic cellulitis of the legs, venous congestion of the legs, shortness of breath, and other medial problems.  He has no trauma, no redness of the knee, the leg has chronic cellulitis distally, and he has some giving way of the knee on the left.  He uses a cane.   Review of Systems  Constitutional: Positive for activity change.  Respiratory: Positive for shortness of breath and wheezing.   Cardiovascular: Positive for chest pain, palpitations and leg swelling.  Gastrointestinal: Positive for abdominal distention.  Endocrine: Positive for cold intolerance.  Musculoskeletal: Positive for arthralgias, back pain, gait problem and joint swelling.  Skin: Positive for color change.  Allergic/Immunologic: Positive for environmental allergies and food allergies.  Psychiatric/Behavioral: The patient is nervous/anxious.   All other systems reviewed and are negative.  For Review of Systems, all other systems reviewed and are negative.  The following is a summary of the past history medically, past history surgically, known current medicines, social history and family history.  This information is gathered electronically by the computer from prior information and documentation.  I review this each visit and have found including this information at this point in the  chart is beneficial and informative.   Past Medical History:  Diagnosis Date   Anxiety    Aortic stenosis    Arthritis    Asthma    Back pain    Cellulitis of skin with lymphangitis    CHF (congestive heart failure) (HCC)    Chronic diastolic congestive heart failure (HCC)    Chronic venous insufficiency    Constipation    COPD (chronic obstructive pulmonary disease) (HCC)    Depression    Dyspnea    Essential hypertension    Gout    Heart murmur    Hypertension    Morbid obesity ([place])    Obesity    Pneumonia    walking pneumonia   Pre-diabetes    Prostatitis    Pulmonary embolism ([place])    Pulmonary hypertension (HCC)    Pyelonephritis    S/P aortic valve replacement with bioprosthetic valve 08/23/2017   25 mm Edwards Inspiris Resilia stented bovine pericardial tissue valve   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   24 mm Hemashield supracoronary straight graft    Sleep apnea    cpap    Thoracic ascending aortic aneurysm (HCC)    Thoracic ascending aortic aneurysm (HCC)    Tobacco abuse     Past Surgical History:  Procedure Laterality Date   AORTIC VALVE REPLACEMENT N/A 08/23/2017   Procedure: AORTIC VALVE REPLACEMENT (AVR) USING INSPIRIS RESILIA AORTIC VALVE SIZE 25 MM;  Surgeon: Rexene Alberts, MD;  Location: [place];  Service: Open Heart Surgery;  Laterality: N/A;   MULTIPLE EXTRACTIONS WITH ALVEOLOPLASTY  N/A 07/23/2017   Procedure: Extraction of tooth #10 with alveoloplasty and gross debridement of remaining teeth;  Surgeon: Lenn Cal, DDS;  Location: [place];  Service: Oral Surgery;  Laterality: N/A;   NO PAST SURGERIES     PACEMAKER IMPLANT N/A 08/28/2017    St Jude Medical Assurity MRI conditional  dual-chamber pacemaker for symptomatic complete heart blockby Dr Rayann Heman   TEE WITHOUT CARDIOVERSION N/A 07/16/2017   Procedure: TRANSESOPHAGEAL ECHOCARDIOGRAM (TEE);  Surgeon: Lelon Perla, MD;  Location: [facility] ENDOSCOPY;   Service: Cardiovascular;  Laterality: N/A;   TEE WITHOUT CARDIOVERSION N/A 08/23/2017   Procedure: TRANSESOPHAGEAL ECHOCARDIOGRAM (TEE);  Surgeon: Rexene Alberts, MD;  Location: [place];  Service: Open Heart Surgery;  Laterality: N/A;   THORACIC AORTIC ANEURYSM REPAIR N/A 08/23/2017   Procedure: THORACIC ASCENDING ANEURYSM REPAIR (AAA) USING HEMASHIELD GOLD KNITTED MICROVEL DOUBLE VELOUR VASCULAR GRAFT D: 24 MM  L: 30 CM;  Surgeon: Rexene Alberts, MD;  Location: [place];  Service: Open Heart Surgery;  Laterality: N/A;    Current Outpatient Medications on File Prior to Visit  Medication Sig Dispense Refill   acetaminophen (TYLENOL) 325 MG tablet Take 650 mg by mouth every 6 (six) hours as needed for mild pain.     albuterol (PROVENTIL) (2.5 MG/3ML) 0.083% nebulizer solution INHALE 1 VIAL VIA NEBULIZER EVERY 6 HOURS AS NEEDED FOR WHEEZING OR SHORTNESS OF BREATH 270 mL 1   albuterol (VENTOLIN HFA) 108 (90 Base) MCG/ACT inhaler Inhale 2 puffs into the lungs every 6 (six) hours as needed for wheezing or shortness of breath. 6 g 1   allopurinol (ZYLOPRIM) 300 MG tablet Take 1 tablet (300 mg total) by mouth daily. 90 tablet 1   busPIRone (BUSPAR) 10 MG tablet Take 10 mg by mouth 3 (three) times daily.      citalopram (CELEXA) 40 MG tablet Take 40 mg by mouth daily.      docusate sodium (COLACE) 100 MG capsule Take 200 mg by mouth daily as needed for mild constipation.      furosemide (LASIX) 80 MG tablet Take 1 tablet (80 mg total) by mouth 2 (two) times daily. 180 tablet 3   HYDROcodone-acetaminophen (NORCO/VICODIN) 5-325 MG tablet Take 1-2 tablets by mouth every 6 (six) hours as needed. 6 tablet 0   icosapent Ethyl (VASCEPA) 1 g capsule Take 2 capsules (2 g total) by mouth daily. 120 capsule 0   Krill Oil 350 MG CAPS Take 1 capsule by mouth daily.     loratadine (CLARITIN) 10 MG tablet Take 10 mg by mouth daily as needed for allergies.     metFORMIN (GLUCOPHAGE) 500 MG tablet Take 1 tablet  (500 mg total) by mouth 2 (two) times daily with a meal. 180 tablet 1   metolazone (ZAROXOLYN) 5 MG tablet Take 1 tablet (5 mg total) by mouth 2 (two) times a week. (Patient taking differently: Take 5 mg by mouth once a week. ) 30 tablet 1   metoprolol tartrate (LOPRESSOR) 100 MG tablet Take 1 tablet (100 mg total) by mouth 2 (two) times daily. 180 tablet 0   Misc. Devices (BARIATRIC CANE) MISC Use cane for support and stabilization when walking. 1 each 0   mometasone-formoterol (DULERA) 200-5 MCG/ACT AERO INHALE 2 PUFFS BY MOUTH TWICE DAILY. RINSE MOUTH AFTER EACH USE 39 g 1   potassium chloride (KLOR-CON) 10 MEQ tablet Take 1-3 tablets (10-30 mEq total) by mouth See admin instructions. Take 3 Tablets (30 mEq totally) by mouth on  Monday, Wednesday, Friday and 1 tablet (10 mEq totally) by mouth other days 55 tablet 3   rivaroxaban (XARELTO) 20 MG TABS tablet TAKE 1 Tablet BY MOUTH ONCE DAILY WITH SUPPER 90 tablet 0   No current facility-administered medications on file prior to visit.    Social History   Socioeconomic History   Marital status: Divorced    Spouse name: Not on file   Number of children: Not on file   Years of education: Not on file   Highest education level: Not on file  Occupational History   Not on file  Tobacco Use   Smoking status: Former Smoker    Packs/day: 0.50    Types: Cigarettes    Quit date: 08/23/2017    Years since quitting: 1.8   Smokeless tobacco: Never Used  Substance and Sexual Activity   Alcohol use: No    Comment: have had alcohol in the past, not heavy   Drug use: No   Sexual activity: Not on file  Other Topics Concern   Not on file  Social History Narrative   Not on file   Social Determinants of Health   Financial Resource Strain:    Difficulty of Paying Living Expenses: Not on file  Food Insecurity:    Worried About Parks in the Last Year: Not on file   Ran Out of Food in the Last Year: Not on file    Transportation Needs:    Lack of Transportation (Medical): Not on file   Lack of Transportation (Non-Medical): Not on file  Physical Activity:    Days of Exercise per Week: Not on file   Minutes of Exercise per Session: Not on file  Stress:    Feeling of Stress : Not on file  Social Connections:    Frequency of Communication with Friends and Family: Not on file   Frequency of Social Gatherings with Friends and Family: Not on file   Attends Religious Services: Not on file   Active Member of Clubs or Organizations: Not on file   Attends Archivist Meetings: Not on file   Marital Status: Not on file  Intimate Partner Violence:    Fear of Current or Ex-Partner: Not on file   Emotionally Abused: Not on file   Physically Abused: Not on file   Sexually Abused: Not on file    Family History  Problem Relation Age of Onset   Hypertension Mother    Alzheimer's disease Mother    Heart attack Brother    Diabetes Brother     Temp (!) 97.3 F (36.3 C)    Ht 5\' 11"  (1.803 m)    Wt (!) 488 lb (221.4 kg)    BMI 68.06 kg/m   Body mass index is 68.06 kg/m. The patient meets the AMA guidelines for Morbid (severe) obesity with a BMI > 40.0 and I have recommended weight loss.  I have independently reviewed and interpreted x-rays of this patient done at another site by another physician or qualified health professional.  I have reviewed the ER record of last month.     Objective:   Physical Exam Vitals and nursing note reviewed.  Constitutional:      Appearance: He is well-developed.  HENT:     Head: Normocephalic and atraumatic.  Eyes:     Conjunctiva/sclera: Conjunctivae normal.     Pupils: Pupils are equal, round, and reactive to light.  Cardiovascular:     Rate and Rhythm: Normal rate  and regular rhythm.  Pulmonary:     Effort: Pulmonary effort is normal.  Abdominal:     Palpations: Abdomen is soft.  Musculoskeletal:     Cervical back: Normal  range of motion and neck supple.       Legs:  Skin:    General: Skin is warm and dry.  Neurological:     Mental Status: He is alert and oriented to person, place, and time.     Cranial Nerves: No cranial nerve deficit.     Motor: No abnormal muscle tone.     Coordination: Coordination normal.     Deep Tendon Reflexes: Reflexes are normal and symmetric. Reflexes normal.  Psychiatric:        Behavior: Behavior normal.        Thought Content: Thought content normal.        Judgment: Judgment normal.           Assessment & Plan:   Encounter Diagnoses  Name Primary?   Chronic pain of left knee Yes   Pulmonary hypertension (HCC)    Chronic venous insufficiency    Sleep apnea, unspecified type    Pacemaker    Body mass index 60.0-69.9, adult ([place])    Morbid obesity ([place])    PROCEDURE NOTE:  The patient requests injections of the left knee , verbal consent was obtained.  The left knee was prepped appropriately after time out was performed.   Sterile technique was observed and injection of 1 cc of Depo-Medrol 40 mg with several cc's of plain xylocaine. Anesthesia was provided by ethyl chloride and a 20-gauge needle was used to inject the knee area. The injection was tolerated well.  A band aid dressing was applied.  The patient was advised to apply ice later today and tomorrow to the injection sight as needed.  I am concerned about medial meniscus tear, perhaps ACL tear.  I have ordered a MRI.  Continue present medicine.  Call if any problem.  Precautions discussed.   Return in two weeks.  Electronically Signed Sanjuana Kava, MD 2/16/20218:24 AM

## 2019-07-14 ENCOUNTER — Other Ambulatory Visit: Payer: Self-pay | Admitting: Orthopaedic Surgery

## 2019-07-14 ENCOUNTER — Telehealth: Payer: Self-pay

## 2019-07-14 ENCOUNTER — Other Ambulatory Visit: Payer: Self-pay | Admitting: Student

## 2019-07-14 ENCOUNTER — Other Ambulatory Visit: Payer: Self-pay | Admitting: Physician Assistant

## 2019-07-14 DIAGNOSIS — G8929 Other chronic pain: Secondary | ICD-10-CM

## 2019-07-14 NOTE — Telephone Encounter (Signed)
Patient with Reginald [name] stating that he could not have a MRI even with the new model pacemaker.Angie gave me the message.  Please advise

## 2019-07-14 NOTE — Telephone Encounter (Signed)
Then schedule for CT arthrogram of the knee on the left.

## 2019-07-15 ENCOUNTER — Other Ambulatory Visit: Payer: Self-pay

## 2019-07-15 DIAGNOSIS — Z950 Presence of cardiac pacemaker: Secondary | ICD-10-CM

## 2019-07-15 DIAGNOSIS — G8929 Other chronic pain: Secondary | ICD-10-CM

## 2019-07-16 ENCOUNTER — Other Ambulatory Visit: Payer: Self-pay

## 2019-07-16 ENCOUNTER — Encounter: Payer: Self-pay | Admitting: Pulmonary Disease

## 2019-07-16 ENCOUNTER — Ambulatory Visit (INDEPENDENT_AMBULATORY_CARE_PROVIDER_SITE_OTHER): Payer: Self-pay | Admitting: Pulmonary Disease

## 2019-07-16 ENCOUNTER — Telehealth: Payer: Self-pay | Admitting: *Deleted

## 2019-07-16 VITALS — BP 134/80 | HR 65 | Ht 71.0 in | Wt >= 6400 oz

## 2019-07-16 DIAGNOSIS — G4733 Obstructive sleep apnea (adult) (pediatric): Secondary | ICD-10-CM

## 2019-07-16 DIAGNOSIS — R0600 Dyspnea, unspecified: Secondary | ICD-10-CM

## 2019-07-16 DIAGNOSIS — I5032 Chronic diastolic (congestive) heart failure: Secondary | ICD-10-CM

## 2019-07-16 DIAGNOSIS — R0609 Other forms of dyspnea: Secondary | ICD-10-CM

## 2019-07-16 DIAGNOSIS — Z9989 Dependence on other enabling machines and devices: Secondary | ICD-10-CM

## 2019-07-16 DIAGNOSIS — I272 Pulmonary hypertension, unspecified: Secondary | ICD-10-CM

## 2019-07-16 NOTE — Progress Notes (Signed)
Synopsis: Referred in February 2021 for COPD by [name], PA-C  Subjective:   PATIENT ID: Reginald [name] GENDER: male DOB: 1970/11/17, MRN: [id number]  Chief Complaint  Patient presents with  . Consult    Pt states he has been having problems with his breathing for about 5 years that has gradually become worse over time. Pt states it is mainly with activities but he could also be sitting and be SOB. Pt states he also has complaints of fatigue.    This is a 49 year old with a past medical history of congestive heart failure, diastolic disease, labeled with COPD, pulmonary function test completed in 2019 these revealed a FEV1 FVC ratio that was normal, mildly reduced spirometry in mid 70s, no significant bronchodilator response patient has a ERV of 32% and a DLCO of 74%.  Pulmonary function test consistent with prism pattern preserved ratio impaired spirometry likely secondary to patient's body habitus weight documented at the time was 432 pounds.,  Patient also has history of pulmonary embolism, morbid obesity, hypertension.  OV 07/16/2019: Today, here for evaluation for shortness of breath this has been going on for some time.  It does look at his weight trajectory has he has gained some weight.  He is now at 471.  He was diagnosed with sleep apnea however did not have insurance.  So he bought a Health visitor used.  He was using the AutoPap machine for some time and then this broke and he bought a regular standard CPAP machine used but he use the AutoPap settings in his machine and has been wearing this regularly.  He does have heart failure and has been using his diuretics twice a day.   Past Medical History:  Diagnosis Date  . Anxiety   . Aortic stenosis   . Arthritis   . Asthma   . Back pain   . Cellulitis of skin with lymphangitis   . CHF (congestive heart failure) ([place])   . Chronic diastolic congestive heart failure ([place])   . Chronic venous insufficiency   . Constipation     . COPD (chronic obstructive pulmonary disease) ([place])   . Depression   . Dyspnea   . Essential hypertension   . Gout   . Heart murmur   . Hypertension   . Morbid obesity ([place])   . Obesity   . Pneumonia    walking pneumonia  . Pre-diabetes   . Prostatitis   . Pulmonary embolism ([place])   . Pulmonary hypertension ([place])   . Pyelonephritis   . S/P aortic valve replacement with bioprosthetic valve 08/23/2017   25 mm Edwards Inspiris Resilia stented bovine pericardial tissue valve  . 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 Sleep apnea    cpap   . Thoracic ascending aortic aneurysm ([place])   . Thoracic ascending aortic aneurysm ([place])   . Tobacco abuse      Family History  Problem Relation Age of Onset  . Hypertension Mother   . Alzheimer's disease Mother   . Heart attack Brother   . Diabetes Brother      Past Surgical History:  Procedure Laterality Date  . AORTIC VALVE REPLACEMENT N/A 08/23/2017   Procedure: AORTIC VALVE REPLACEMENT (AVR) USING INSPIRIS RESILIA AORTIC VALVE SIZE 25 MM;  Surgeon: Rexene Alberts, MD;  Location: [employer] [facility];  Service: Open Heart Surgery;  Laterality: N/A;  . MULTIPLE EXTRACTIONS WITH ALVEOLOPLASTY N/A 07/23/2017   Procedure: Extraction  of tooth #10 with alveoloplasty and gross debridement of remaining teeth;  Surgeon: Lenn Cal, DDS;  Location: [facility];  Service: Oral Surgery;  Laterality: N/A;  . NO PAST SURGERIES    . PACEMAKER IMPLANT N/A 08/28/2017    St Jude Medical Assurity MRI conditional  dual-chamber pacemaker for symptomatic complete heart blockby Dr Rayann Heman  . TEE WITHOUT CARDIOVERSION N/A 07/16/2017   Procedure: TRANSESOPHAGEAL ECHOCARDIOGRAM (TEE);  Surgeon: Lelon Perla, MD;  Location: [facility] ENDOSCOPY;  Service: Cardiovascular;  Laterality: N/A;  . TEE WITHOUT CARDIOVERSION N/A 08/23/2017   Procedure: TRANSESOPHAGEAL ECHOCARDIOGRAM (TEE);  Surgeon: Rexene Alberts, MD;  Location: [facility];   Service: Open Heart Surgery;  Laterality: N/A;  . THORACIC AORTIC ANEURYSM REPAIR N/A 08/23/2017   Procedure: THORACIC ASCENDING ANEURYSM REPAIR (AAA) USING HEMASHIELD GOLD KNITTED MICROVEL DOUBLE VELOUR VASCULAR GRAFT D: 24 MM  L: 30 CM;  Surgeon: Rexene Alberts, MD;  Location: [facility];  Service: Open Heart Surgery;  Laterality: N/A;    Social History   Socioeconomic History  . Marital status: Divorced    Spouse name: Not on file  . Number of children: Not on file  . Years of education: Not on file  . Highest education level: Not on file  Occupational History  . Not on file  Tobacco Use  . Smoking status: Former Smoker    Packs/day: 2.00    Years: 16.00    Pack years: 32.00    Types: Cigarettes    Quit date: 08/23/2017    Years since quitting: 1.8  . Smokeless tobacco: Never Used  Substance and Sexual Activity  . Alcohol use: No    Comment: have had alcohol in the past, not heavy  . Drug use: No  . Sexual activity: Not on file  Other Topics Concern  . Not on file  Social History Narrative  . Not on file   Social Determinants of Health   Financial Resource Strain:   . Difficulty of Paying Living Expenses: Not on file  Food Insecurity:   . Worried About Charity fundraiser in the Last Year: Not on file  . Ran Out of Food in the Last Year: Not on file  Transportation Needs:   . Lack of Transportation (Medical): Not on file  . Lack of Transportation (Non-Medical): Not on file  Physical Activity:   . Days of Exercise per Week: Not on file  . Minutes of Exercise per Session: Not on file  Stress:   . Feeling of Stress : Not on file  Social Connections:   . Frequency of Communication with Friends and Family: Not on file  . Frequency of Social Gatherings with Friends and Family: Not on file  . Attends Religious Services: Not on file  . Active Member of Clubs or Organizations: Not on file  . Attends Archivist Meetings: Not on file  . Marital Status: Not on file    Intimate Partner Violence:   . Fear of Current or Ex-Partner: Not on file  . Emotionally Abused: Not on file  . Physically Abused: Not on file  . Sexually Abused: Not on file     No Known Allergies   Outpatient Medications Prior to Visit  Medication Sig Dispense Refill  . acetaminophen (TYLENOL) 325 MG tablet Take 650 mg by mouth every 6 (six) hours as needed for mild pain.    Marland Kitchen albuterol (PROVENTIL) (2.5 MG/3ML) 0.083% nebulizer solution INHALE 1 VIAL VIA NEBULIZER EVERY 6 HOURS  AS NEEDED FOR WHEEZING OR SHORTNESS OF BREATH 270 mL 1  . allopurinol (ZYLOPRIM) 300 MG tablet TAKE 1 Tablet BY MOUTH ONCE EVERY DAY 90 tablet 1  . busPIRone (BUSPAR) 10 MG tablet Take 10 mg by mouth 3 (three) times daily.     . citalopram (CELEXA) 40 MG tablet Take 40 mg by mouth daily.     Marland Kitchen docusate sodium (COLACE) 100 MG capsule Take 200 mg by mouth daily as needed for mild constipation.     . furosemide (LASIX) 80 MG tablet Take 1 tablet (80 mg total) by mouth 2 (two) times daily. 180 tablet 3  . icosapent Ethyl (VASCEPA) 1 g capsule Take 2 capsules (2 g total) by mouth daily. 120 capsule 0  . Krill Oil 350 MG CAPS Take 1 capsule by mouth daily.    Marland Kitchen loratadine (CLARITIN) 10 MG tablet Take 10 mg by mouth daily as needed for allergies.    . metFORMIN (GLUCOPHAGE) 500 MG tablet Take 1 tablet (500 mg total) by mouth 2 (two) times daily with a meal. 180 tablet 1  . metolazone (ZAROXOLYN) 5 MG tablet Take 1 tablet (5 mg total) by mouth 2 (two) times a week. (Patient taking differently: Take 5 mg by mouth once a week. ) 30 tablet 1  . metoprolol tartrate (LOPRESSOR) 100 MG tablet TAKE 1 Tablet  BY MOUTH TWICE DAILY 180 tablet 0  . Misc. Devices (BARIATRIC CANE) MISC Use cane for support and stabilization when walking. 1 each 0  . mometasone-formoterol (DULERA) 200-5 MCG/ACT AERO INHALE 2 PUFFS BY MOUTH TWICE DAILY. RINSE MOUTH AFTER EACH USE 39 g 1  . potassium chloride (MICRO-K) 10 MEQ CR capsule TAKE 3 Capsules  BY MOUTH ONCE DAILY EVERY MONDAY, WEDNESDAY AND FRIDAY AND TAKE 1 Capsule BY MOUTH ONCE DAILY ON ALL OTHER DAYS 55 capsule 3  . PROVENTIL HFA 108 (90 Base) MCG/ACT inhaler INHALE 2 PUFFS BY MOUTH EVERY 6 HOURS AS NEEDED FOR COUGHING, WHEEZING, OR SHORTNESS OF BREATH 6.7 g 1  . XARELTO 20 MG TABS tablet TAKE 1 Tablet BY MOUTH ONCE EVERY DAY WITH SUPPER 90 tablet 0   No facility-administered medications prior to visit.    Review of Systems  Constitutional: Negative for chills, fever, malaise/fatigue and weight loss.  HENT: Negative for hearing loss, sore throat and tinnitus.   Eyes: Negative for blurred vision and double vision.  Respiratory: Positive for shortness of breath and wheezing. Negative for cough, hemoptysis, sputum production and stridor.   Cardiovascular: Positive for leg swelling and PND. Negative for chest pain, palpitations and orthopnea.  Gastrointestinal: Negative for abdominal pain, constipation, diarrhea, heartburn, nausea and vomiting.  Genitourinary: Negative for dysuria, hematuria and urgency.  Musculoskeletal: Negative for joint pain and myalgias.  Skin: Negative for itching and rash.  Neurological: Negative for dizziness, tingling, weakness and headaches.  Endo/Heme/Allergies: Negative for environmental allergies. Does not bruise/bleed easily.  Psychiatric/Behavioral: Negative for depression. The patient is not nervous/anxious and does not have insomnia.   All other systems reviewed and are negative.    Objective:  Physical Exam Vitals reviewed.  Constitutional:      General: He is not in acute distress.    Appearance: He is well-developed. He is obese.  HENT:     Head: Normocephalic and atraumatic.  Eyes:     General: No scleral icterus.    Conjunctiva/sclera: Conjunctivae normal.     Pupils: Pupils are equal, round, and reactive to light.  Neck:  Vascular: No JVD.     Trachea: No tracheal deviation.     Comments: Large neck Cardiovascular:     Rate  and Rhythm: Normal rate and regular rhythm.     Heart sounds: Normal heart sounds. No murmur.  Pulmonary:     Effort: Pulmonary effort is normal. No tachypnea, accessory muscle usage or respiratory distress.     Breath sounds: Normal breath sounds. No stridor. No wheezing, rhonchi or rales.  Abdominal:     Palpations: Abdomen is soft.     Comments: Obese pannus  Musculoskeletal:        General: No tenderness.     Cervical back: Neck supple.  Skin:    General: Skin is warm and dry.     Capillary Refill: Capillary refill takes less than 2 seconds.     Findings: No rash.  Neurological:     Mental Status: He is alert and oriented to person, place, and time.  Psychiatric:        Behavior: Behavior normal.      Vitals:   07/16/19 0948  BP: 134/80  Pulse: 65  SpO2: 97%  Weight: (!) 471 lb 3.2 oz (213.7 kg)  Height: 5\' 11"  (1.803 m)   97% on  RA BMI Readings from Last 3 Encounters:  07/16/19 65.72 kg/m  07/08/19 68.06 kg/m  06/30/19 69.04 kg/m   Wt Readings from Last 3 Encounters:  07/16/19 (!) 471 lb 3.2 oz (213.7 kg)  07/08/19 (!) 488 lb (221.4 kg)  06/30/19 (!) 495 lb (224.5 kg)     CBC    Component Value Date/Time   WBC 13.1 (H) 03/24/2019 1511   RBC 5.10 03/24/2019 1511   HGB 13.1 03/24/2019 1511   HCT 42.6 03/24/2019 1511   PLT 331 03/24/2019 1511   MCV 83.5 03/24/2019 1511   MCH 25.7 (L) 03/24/2019 1511   MCHC 30.8 03/24/2019 1511   RDW 16.7 (H) 03/24/2019 1511   LYMPHSABS 3.0 03/15/2019 2315   MONOABS 0.8 03/15/2019 2315   EOSABS 0.3 03/15/2019 2315   BASOSABS 0.1 03/15/2019 2315    Pulmonary Functions Testing Results: PFT Results Latest Ref Rng & Units 07/26/2017  FVC-Pre L 4.09  FVC-Predicted Pre % 76  FVC-Post L 3.85  FVC-Predicted Post % 72  Pre FEV1/FVC % % 77  Post FEV1/FCV % % 82  FEV1-Pre L 3.17  FEV1-Predicted Pre % 75  FEV1-Post L 3.17  DLCO UNC% % 74  DLCO COR %Predicted % 94  TLC L 6.34  TLC % Predicted % 88  RV % Predicted %  110      Assessment & Plan:     ICD-10-CM   1. DOE (dyspnea on exertion)  R06.00   2. Pulmonary hypertension (HCC)  I27.20   3. Chronic heart failure with preserved ejection fraction (HFpEF) (HCC)  I50.32   4. OSA on CPAP  G47.33    Z99.89     Assessment:   This is a Severe morbidly obese gentleman with chronic diastolic heart failure and obstructive sleep apnea on CPAP, he also has likely component of chronic right ventricular failure secondary to this.  He has chronic bilateral lower extremity edema and abdominal fullness due to this.  Due to noncompliance with CPAP has persistent decompensation of his volume status and progressive dyspnea and shortness of breath.  He has been compliant with his diuretic regimen.  It is unclear to me at this time but he may have a component of obesity related asthma.  Patient does respond to albuterol use.  Has not trialed himself off of Dulera at any point.   Plan Following Extensive Data Review & Interpretation:  . I reviewed prior external note(s) from 07/08/2019 orthopedics, Dr. Luna Glasgow knee injection, 06/24/2019 [name], [place] primary care office, 06/19/2019 Bernerd Pho, [place] cardiology office . I reviewed the result(s) of 03/16/2019 echocardiogram preserved ejection fraction 60-65%, moderately reduced right ventricular systolic function bioprosthetic aortic valve.  [id number] complete metabolic panel serum creatinine 0.99 . I have ordered referral for follow-up in our sleep clinic.  He needs to establish care and evaluate his current CPAP use and regimen. Patient was also counseled today on weight loss management.  This is going to be the single most important thing that we can do to improve his respiratory status. He can continue his current use of Dulera however he may just have a component of obesity related asthma.  At this time cannot trial himself off of Dulera to see if he has a return or change in symptoms.  Can continue use of albuterol for  shortness of breath and wheezing.  Independent interpretation of tests . Review of patient's chest x-ray 03/15/2019 images revealed enlarged cardiac silhouette, no pulmonary infiltrates consolidation or effusion. [name] images have been independently reviewed by me.    Patient can follow-up with me as needed.  Would benefit mostly from continued follow-up in sleep clinic.    Current Outpatient Medications:  .  acetaminophen (TYLENOL) 325 MG tablet, Take 650 mg by mouth every 6 (six) hours as needed for mild pain., Disp: , Rfl:  .  albuterol (PROVENTIL) (2.5 MG/3ML) 0.083% nebulizer solution, INHALE 1 VIAL VIA NEBULIZER EVERY 6 HOURS AS NEEDED FOR WHEEZING OR SHORTNESS OF BREATH, Disp: 270 mL, Rfl: 1 .  allopurinol (ZYLOPRIM) 300 MG tablet, TAKE 1 Tablet BY MOUTH ONCE EVERY DAY, Disp: 90 tablet, Rfl: 1 .  busPIRone (BUSPAR) 10 MG tablet, Take 10 mg by mouth 3 (three) times daily. , Disp: , Rfl:  .  citalopram (CELEXA) 40 MG tablet, Take 40 mg by mouth daily. , Disp: , Rfl:  .  docusate sodium (COLACE) 100 MG capsule, Take 200 mg by mouth daily as needed for mild constipation. , Disp: , Rfl:  .  furosemide (LASIX) 80 MG tablet, Take 1 tablet (80 mg total) by mouth 2 (two) times daily., Disp: 180 tablet, Rfl: 3 .  icosapent Ethyl (VASCEPA) 1 g capsule, Take 2 capsules (2 g total) by mouth daily., Disp: 120 capsule, Rfl: 0 .  Krill Oil 350 MG CAPS, Take 1 capsule by mouth daily., Disp: , Rfl:  .  loratadine (CLARITIN) 10 MG tablet, Take 10 mg by mouth daily as needed for allergies., Disp: , Rfl:  .  metFORMIN (GLUCOPHAGE) 500 MG tablet, Take 1 tablet (500 mg total) by mouth 2 (two) times daily with a meal., Disp: 180 tablet, Rfl: 1 .  metolazone (ZAROXOLYN) 5 MG tablet, Take 1 tablet (5 mg total) by mouth 2 (two) times a week. (Patient taking differently: Take 5 mg by mouth once a week. ), Disp: 30 tablet, Rfl: 1 .  metoprolol tartrate (LOPRESSOR) 100 MG tablet, TAKE 1 Tablet  BY MOUTH TWICE  DAILY, Disp: 180 tablet, Rfl: 0 .  Misc. Devices (BARIATRIC CANE) MISC, Use cane for support and stabilization when walking., Disp: 1 each, Rfl: 0 .  mometasone-formoterol (DULERA) 200-5 MCG/ACT AERO, INHALE 2 PUFFS BY MOUTH TWICE DAILY. RINSE MOUTH AFTER EACH USE, Disp: 39 g, Rfl: 1 .  potassium chloride (MICRO-K) 10 MEQ CR capsule, TAKE 3 Capsules BY MOUTH ONCE DAILY EVERY MONDAY, WEDNESDAY AND FRIDAY AND TAKE 1 Capsule BY MOUTH ONCE DAILY ON ALL OTHER DAYS, Disp: 55 capsule, Rfl: 3 .  PROVENTIL HFA 108 (90 Base) MCG/ACT inhaler, INHALE 2 PUFFS BY MOUTH EVERY 6 HOURS AS NEEDED FOR COUGHING, WHEEZING, OR SHORTNESS OF BREATH, Disp: 6.7 g, Rfl: 1 .  XARELTO 20 MG TABS tablet, TAKE 1 Tablet BY MOUTH ONCE EVERY DAY WITH SUPPER, Disp: 90 tablet, Rfl: 0   Garner Nash, DO [facility] 07/16/2019 9:55 AM

## 2019-07-16 NOTE — Telephone Encounter (Signed)
Pt voiced understanding

## 2019-07-16 NOTE — Patient Instructions (Addendum)
Thank you for visiting Dr. Valeta Harms at [facility]. Today we recommend the following:  Please set patient up with sleep physician for next available sleep consultation.  Return if symptoms worsen or fail to improve.    Please do your part to reduce the spread of COVID-19.

## 2019-07-16 NOTE — Telephone Encounter (Addendum)
    He has lost over 20 lbs in the past month which is great as this was previously 491 lbs in 05/2019. Continue current medication regimen for now.   He was previously coming to the [place] office and weighing but had transportation issues and lives in [place]. Unable to weigh on home scales due to being over the max weight. If you prefer he have a nursing visit to ensure this dose not interfere with Coumadin, that is fine by me. Feel free to send me his weights when he does come in as this has been quite variable over the past few months.   Signed, Erma Heritage, PA-C 07/16/2019, 11:27 AM Pager: [phone]

## 2019-07-16 NOTE — Telephone Encounter (Signed)
Pt stopped by for weight check 470lbs - said he was advised that he could come by and weigh at anytime - requested I send weight to [place], [place]

## 2019-07-23 ENCOUNTER — Ambulatory Visit: Payer: Self-pay | Admitting: Physician Assistant

## 2019-07-24 ENCOUNTER — Ambulatory Visit: Payer: Self-pay | Admitting: Orthopaedic Surgery

## 2019-07-24 ENCOUNTER — Encounter: Payer: Self-pay | Admitting: Physician Assistant

## 2019-07-24 ENCOUNTER — Ambulatory Visit: Payer: Self-pay | Admitting: Physician Assistant

## 2019-07-24 VITALS — BP 143/93

## 2019-07-24 DIAGNOSIS — E1165 Type 2 diabetes mellitus with hyperglycemia: Secondary | ICD-10-CM

## 2019-07-24 NOTE — Progress Notes (Signed)
BP (!) 143/93   SpO2 96%    Subjective:    Patient ID: Reginald [name], male    DOB: Sep 01, 1970, 49 y.o.   MRN: [id number]  HPI: Reginald [name] is a 49 y.o. male presenting on 07/24/2019 for No chief complaint on file.   HPI   This is a telemedicine appointment through [employer] due to coronavirus pandemic.  I connected with  Reginald [name] on 07/24/19 by a video enabled telemedicine application and verified that I am speaking with the correct person using two identifiers.   I discussed the limitations of evaluation and management by telemedicine. The patient expressed understanding and agreed to proceed.  Pt is at home.  Provider is working from home office.    Pt is pleasant 53yoM with super moribd obesity, DM, CHF, PAF, AS, HTN, OSA, and mood disorder.    appt today to check on his new meds; he was started on metformin at previous OV..  fbs today 146.  He is tolerating metformin without difficulty  He is still going to [facility].       Relevant past medical, surgical, family and social history reviewed and updated as indicated. Interim medical history since our last visit reviewed. Allergies and medications reviewed and updated.   Current Outpatient Medications:  .  allopurinol (ZYLOPRIM) 300 MG tablet, TAKE 1 Tablet BY MOUTH ONCE EVERY DAY, Disp: 90 tablet, Rfl: 1 .  busPIRone (BUSPAR) 10 MG tablet, Take 10 mg by mouth 3 (three) times daily. , Disp: , Rfl:  .  citalopram (CELEXA) 40 MG tablet, Take 40 mg by mouth daily. , Disp: , Rfl:  .  docusate sodium (COLACE) 100 MG capsule, Take 200 mg by mouth daily as needed for mild constipation. , Disp: , Rfl:  .  furosemide (LASIX) 80 MG tablet, Take 1 tablet (80 mg total) by mouth 2 (two) times daily., Disp: 180 tablet, Rfl: 3 .  icosapent Ethyl (VASCEPA) 1 g capsule, Take 2 capsules (2 g total) by mouth daily., Disp: 120 capsule, Rfl: 0 .  Krill Oil 350 MG CAPS, Take 1 capsule by mouth daily., Disp: , Rfl:  .  loratadine (CLARITIN)  10 MG tablet, Take 10 mg by mouth daily as needed for allergies., Disp: , Rfl:  .  metFORMIN (GLUCOPHAGE) 500 MG tablet, Take 1 tablet (500 mg total) by mouth 2 (two) times daily with a meal., Disp: 180 tablet, Rfl: 1 .  metolazone (ZAROXOLYN) 5 MG tablet, Take 1 tablet (5 mg total) by mouth 2 (two) times a week. (Patient taking differently: Take 5 mg by mouth once a week. ), Disp: 30 tablet, Rfl: 1 .  metoprolol tartrate (LOPRESSOR) 100 MG tablet, TAKE 1 Tablet  BY MOUTH TWICE DAILY, Disp: 180 tablet, Rfl: 0 .  mometasone-formoterol (DULERA) 200-5 MCG/ACT AERO, INHALE 2 PUFFS BY MOUTH TWICE DAILY. RINSE MOUTH AFTER EACH USE, Disp: 39 g, Rfl: 1 .  potassium chloride (MICRO-K) 10 MEQ CR capsule, TAKE 3 Capsules BY MOUTH ONCE DAILY EVERY MONDAY, WEDNESDAY AND FRIDAY AND TAKE 1 Capsule BY MOUTH ONCE DAILY ON ALL OTHER DAYS, Disp: 55 capsule, Rfl: 3 .  PROVENTIL HFA 108 (90 Base) MCG/ACT inhaler, INHALE 2 PUFFS BY MOUTH EVERY 6 HOURS AS NEEDED FOR COUGHING, WHEEZING, OR SHORTNESS OF BREATH, Disp: 6.7 g, Rfl: 1 .  XARELTO 20 MG TABS tablet, TAKE 1 Tablet BY MOUTH ONCE EVERY DAY WITH SUPPER, Disp: 90 tablet, Rfl: 0 .  acetaminophen (TYLENOL) 325 MG tablet, Take  650 mg by mouth every 6 (six) hours as needed for mild pain., Disp: , Rfl:  .  albuterol (PROVENTIL) (2.5 MG/3ML) 0.083% nebulizer solution, INHALE 1 VIAL VIA NEBULIZER EVERY 6 HOURS AS NEEDED FOR WHEEZING OR SHORTNESS OF BREATH (Patient not taking: Reported on 07/24/2019), Disp: 270 mL, Rfl: 1     Review of Systems  Per HPI unless specifically indicated above     Objective:    BP (!) 143/93   SpO2 96%   Wt Readings from Last 3 Encounters:  07/16/19 (!) 471 lb 3.2 oz (213.7 kg)  07/08/19 (!) 488 lb (221.4 kg)  06/30/19 (!) 495 lb (224.5 kg)    Physical Exam Constitutional:      General: He is not in acute distress.    Appearance: He is obese. He is not toxic-appearing.  HENT:     Head: Normocephalic and atraumatic.  Pulmonary:      Effort: No respiratory distress.  Neurological:     Mental Status: He is alert and oriented to person, place, and time.  Psychiatric:        Attention and Perception: Attention normal.        Speech: Speech normal.        Behavior: Behavior is cooperative.         Assessment & Plan:   Encounter Diagnosis  Name Primary?  . Type 2 diabetes mellitus with hyperglycemia, unspecified whether long term insulin use ([place]) Yes      -pt is doing fine with metformin.  He is to Continue current medications -pt to follow up in 2 months.  He is to contact office sooner prn

## 2019-07-25 ENCOUNTER — Encounter (HOSPITAL_COMMUNITY): Payer: Self-pay

## 2019-07-25 ENCOUNTER — Other Ambulatory Visit: Payer: Self-pay

## 2019-07-25 ENCOUNTER — Ambulatory Visit (HOSPITAL_COMMUNITY)
Admission: RE | Admit: 2019-07-25 | Discharge: 2019-07-25 | Disposition: A | Discharge status: Home / Self Care | Payer: Self-pay | Source: Ambulatory Visit | Attending: Orthopaedic Surgery | Admitting: Orthopaedic Surgery

## 2019-07-25 DIAGNOSIS — M25562 Pain in left knee: Secondary | ICD-10-CM | POA: Insufficient documentation

## 2019-07-25 DIAGNOSIS — G8929 Other chronic pain: Secondary | ICD-10-CM | POA: Insufficient documentation

## 2019-07-25 MED ORDER — LIDOCAINE HCL (PF) 1 % IJ SOLN
INTRAMUSCULAR | Status: AC
  Filled 2019-07-25: qty 5

## 2019-07-25 MED ORDER — POVIDONE-IODINE 10 % EX SOLN
CUTANEOUS | Status: AC
  Filled 2019-07-25: qty 15

## 2019-07-25 MED ORDER — LIDOCAINE HCL (PF) 1 % IJ SOLN
INTRAMUSCULAR | Status: AC
  Administered 2019-07-25: 5 mL
  Filled 2019-07-25: qty 5

## 2019-07-25 MED ORDER — SODIUM CHLORIDE (PF) 0.9 % IJ SOLN
INTRAMUSCULAR | Status: AC
  Administered 2019-07-25: 10 mL
  Filled 2019-07-25: qty 10

## 2019-07-25 MED ORDER — IOHEXOL 180 MG/ML  SOLN
30.0000 mL | Freq: Once | INTRAMUSCULAR | Status: AC | PRN
  Administered 2019-07-25: 30 mL via INTRA_ARTICULAR

## 2019-07-25 MED ORDER — LIDOCAINE HCL (PF) 2 % IJ SOLN
INTRAMUSCULAR | Status: AC
  Filled 2019-07-25: qty 10

## 2019-07-25 MED ORDER — LIDOCAINE HCL (PF) 2 % IJ SOLN
INTRAMUSCULAR | Status: AC
  Administered 2019-07-25: 12:00:00 2 mL
  Filled 2019-07-25: qty 10

## 2019-07-30 ENCOUNTER — Encounter: Payer: Self-pay | Admitting: Pulmonary Disease

## 2019-07-30 ENCOUNTER — Ambulatory Visit (INDEPENDENT_AMBULATORY_CARE_PROVIDER_SITE_OTHER): Payer: Self-pay | Admitting: Pulmonary Disease

## 2019-07-30 ENCOUNTER — Other Ambulatory Visit: Payer: Self-pay

## 2019-07-30 DIAGNOSIS — I272 Pulmonary hypertension, unspecified: Secondary | ICD-10-CM

## 2019-07-30 DIAGNOSIS — G4733 Obstructive sleep apnea (adult) (pediatric): Secondary | ICD-10-CM

## 2019-07-30 DIAGNOSIS — I2699 Other pulmonary embolism without acute cor pulmonale: Secondary | ICD-10-CM

## 2019-07-30 DIAGNOSIS — Z9989 Dependence on other enabling machines and devices: Secondary | ICD-10-CM

## 2019-07-30 NOTE — Assessment & Plan Note (Addendum)
Likely related to left heart disease as also severe OSA/obesity. Continue diuretics for now May need nocturnal oximetry in the future to see if he would benefit from oxygen

## 2019-07-30 NOTE — Patient Instructions (Addendum)
I changed your REMstar machine which is set at 10.5 cm to auto settings today  You can call [phone] and take your other machine with you-so that we can change it to auto settings  Contact CPAP assistance program  Call us if you are able to get insurance and we can try to provide you with a new machine

## 2019-07-30 NOTE — Progress Notes (Signed)
Subjective:    Patient ID: Reginald [name], male    DOB: 04-24-1971, 49 y.o.   MRN: [id number]  HPI  49 year old morbidly obese man presents to establish care for OSA. I reviewed consultation report from my partner that he saw on 2/21 for shortness of breath.  He has been diagnosed with OSA per at least 13 years and has been using a CPAP since.  Unfortunately he never had insurance, he got his first CPAP machine used and was in auto settings.  His second machine which he is using now is a REMstar which she has had for at least the past 7 years, he brought this of [employer], machine appears to be old and has dog around it, set at 10.5 cm with a ramp of 4, C-Flex of 2, he uses a Mirage full facemask.  Gets supplies off the Internet whenever he can afford it. When he was admitted to the hospital they placed him on CPAP at 12 to 13 cm and he likes this better. Interestingly ABG from 08/2017 does not show any evidence of CO2 retention, 7.4 4/40/64 and 7.3 8/44/103 although admission ABG appears to be 7.34/56/383  I reviewed hospital course 02/2019 when he was admitted for pyelonephritis and AKI, renal function has now improved to baseline.  He takes 80 mg of Lasix twice a day and takes metolazone on Mondays.  I reviewed cardiology office visit from 05/2019-and his weight of 476 pounds appears to be stable since then.  Epworth sleepiness score is 20 Bedtime is anywhere after 10 PM, sleep latency is variable, he sleeps on his side with 2 pillows, sometimes prefers to sleep on his stomach, his bed is raised about 30 degrees.  He is up all hours of the night, at least 5-6 nocturnal awakenings he moves a lot in his bed is in the best when he wakes up, wake-up time is now fixed could be as late as 9:51 AM, feels tired with dryness of mouth but denies headaches.  Does use the humidifier in the machine. His weight is fluctuated over the past few years within 43 to 50 pounds  He has NYHA class II symptoms and  chronic bipedal edema  PMH - severe AS (s/p bovine tissue AVR in 08/2017 with repair of ascending thoracic aortic aneurysm),normal coronary arteries by cardiac catheterization in 2019, paroxysmal atrial fibrillation, CHB(s/p PPM placement in 08/2017, history of bilateral PE on Xarelto, HTN, morbid obesity   Significant tests/ events reviewed Labs 06/18/2019, BUN/creatinine 25/0.9  PFTs 07/2017 mild restriction, no obstruction, ratio 77, FEV1 75%, FVC 76%, no bronchodilator response, TLC 88%, DLCO 74% but corrects for alveolar volume  Echo 02/2019 normal LVEF, decreased RV function, RVSP 39    Past Medical History:  Diagnosis Date  . Anxiety   . Aortic stenosis   . Arthritis   . Asthma   . Back pain   . Cellulitis of skin with lymphangitis   . CHF (congestive heart failure) ([place])   . Chronic diastolic congestive heart failure ([place])   . Chronic venous insufficiency   . Constipation   . COPD (chronic obstructive pulmonary disease) ([place])   . Depression   . Dyspnea   . Essential hypertension   . Gout   . Heart murmur   . Hypertension   . Morbid obesity ([place])   . Obesity   . Pneumonia    walking pneumonia  . Pre-diabetes   . Prostatitis   . Pulmonary embolism ([place])   .  Pulmonary hypertension ([place])   . Pyelonephritis   . S/P aortic valve replacement with bioprosthetic valve 08/23/2017   25 mm Edwards Inspiris Resilia stented bovine pericardial tissue valve  . 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 Sleep apnea    cpap   . Thoracic ascending aortic aneurysm ([place])   . Thoracic ascending aortic aneurysm ([place])   . Tobacco abuse      Review of Systems Constitutional: negative for anorexia, fevers and sweats  Eyes: negative for irritation, redness and visual disturbance  Ears, nose, mouth, throat, and face: negative for earaches, epistaxis, nasal congestion and sore throat  Respiratory: negative for cough,  sputum and  wheezing  Cardiovascular: negative for chest pain,orthopnea, palpitations and syncope  Gastrointestinal: negative for abdominal pain, constipation, diarrhea, melena, nausea and vomiting  Genitourinary:negative for dysuria, frequency and hematuria  Hematologic/lymphatic: negative for bleeding, easy bruising and lymphadenopathy  Musculoskeletal:negative for arthralgias, muscle weakness and stiff joints  Neurological: negative for coordination problems, gait problems, headaches and weakness  Endocrine: negative for diabetic symptoms including polydipsia, polyuria and weight loss     Objective:   Physical Exam  Gen. Pleasant, obese, in no distress, normal affect ENT - no pallor,icterus, no post nasal drip, class 2-3 airway Neck: No JVD, no thyromegaly, no carotid bruits Lungs: no use of accessory muscles, no dullness to percussion, decreased without rales or rhonchi  Cardiovascular: Rhythm regular, heart sounds  normal, no murmurs or gallops, 2+ peripheral edema Abdomen: soft and non-tender, no hepatosplenomegaly, BS normal. Musculoskeletal: No deformities, no cyanosis or clubbing Neuro:  alert, non focal, no tremors       Assessment & Plan:

## 2019-07-30 NOTE — Assessment & Plan Note (Signed)
Continue Xarelto he gets this through an assistance program

## 2019-07-30 NOTE — Assessment & Plan Note (Addendum)
He likely has severe OSA , interestingly he does not have significant obesity hypoventilation syndrome so I do not feel that he needs BiPAP, CPAP may suffice for him.  His main issue of course is lack of insurance and inability to afford.  He is applied for disability and is expecting that he may be approved over the next year.  He lives by himself so no bed partner history is available but his CPAP pressure of 10.5 cm likely suboptimal  I changed your REMstar machine which is set at 10.5 cm to auto settings today  You can call [phone] and take your other machine with you-so that we can change it to auto settings  Contact CPAP organization if you would like a new machine  Call us if you are able to get insurance and we can try to provide you with a new machine.  Need a sleep study and either a formal CPAP titration or auto CPAP and check nocturnal oximetry for oxygen requirements  Weight loss encouraged, compliance with goal of at least 4-6 hrs every night is the expectation. Advised against medications with sedative side effects Cautioned against driving when sleepy - understanding that sleepiness will vary on a day to day basis

## 2019-08-05 ENCOUNTER — Other Ambulatory Visit: Payer: Self-pay

## 2019-08-05 ENCOUNTER — Ambulatory Visit (HOSPITAL_COMMUNITY)
Admission: RE | Admit: 2019-08-05 | Discharge: 2019-08-05 | Disposition: A | Discharge status: Home / Self Care | Payer: Self-pay | Source: Ambulatory Visit | Attending: Orthopaedic Surgery | Admitting: Orthopaedic Surgery

## 2019-08-05 DIAGNOSIS — M25562 Pain in left knee: Secondary | ICD-10-CM | POA: Insufficient documentation

## 2019-08-05 DIAGNOSIS — G8929 Other chronic pain: Secondary | ICD-10-CM | POA: Insufficient documentation

## 2019-08-07 ENCOUNTER — Other Ambulatory Visit: Payer: Self-pay

## 2019-08-07 ENCOUNTER — Encounter: Payer: Self-pay | Admitting: Orthopaedic Surgery

## 2019-08-07 ENCOUNTER — Ambulatory Visit (INDEPENDENT_AMBULATORY_CARE_PROVIDER_SITE_OTHER): Payer: Self-pay | Admitting: Orthopaedic Surgery

## 2019-08-07 VITALS — BP 128/74 | HR 65 | Ht 71.0 in | Wt >= 6400 oz

## 2019-08-07 DIAGNOSIS — G8929 Other chronic pain: Secondary | ICD-10-CM

## 2019-08-07 DIAGNOSIS — M25562 Pain in left knee: Secondary | ICD-10-CM

## 2019-08-07 DIAGNOSIS — Z6841 Body Mass Index (BMI) 40.0 and over, adult: Secondary | ICD-10-CM

## 2019-08-07 DIAGNOSIS — Z950 Presence of cardiac pacemaker: Secondary | ICD-10-CM

## 2019-08-07 DIAGNOSIS — I872 Venous insufficiency (chronic) (peripheral): Secondary | ICD-10-CM

## 2019-08-07 DIAGNOSIS — G4730 Sleep apnea, unspecified: Secondary | ICD-10-CM

## 2019-08-07 DIAGNOSIS — I272 Pulmonary hypertension, unspecified: Secondary | ICD-10-CM

## 2019-08-07 NOTE — Progress Notes (Signed)
Patient MR:[id number] Reginald [name], male DOB:10/22/1970, 49 y.o. [id number]  Chief Complaint  Patient presents with  . Knee Pain    left/ hurts locks up  . Results    MRI review     HPI  Reginald [name] is a 49 y.o. male who as left knee pain.  He has had MRI of the knee which showed: IMPRESSION: 1. Unchanged complete radial tear through the medial meniscus posterior root. No extrusion. 2. Tiny oblique undersurface tear of the lateral meniscus anterior horn. 3. Patellofemoral compartment osteoarthritis.  I have informed him of the findings.  I have recommended he consider arthroscopy of the knee.  I will have him see Dr. Aline Brochure of this office.  He agrees.   Body mass index is 66.53 kg/m.  ROS  Review of Systems  Constitutional: Positive for activity change.  Respiratory: Positive for shortness of breath and wheezing.   Cardiovascular: Positive for chest pain, palpitations and leg swelling.  Gastrointestinal: Positive for abdominal distention.  Endocrine: Positive for cold intolerance.  Musculoskeletal: Positive for arthralgias, back pain, gait problem and joint swelling.  Skin: Positive for color change.  Allergic/Immunologic: Positive for environmental allergies and food allergies.  Psychiatric/Behavioral: The patient is nervous/anxious.   All other systems reviewed and are negative.   All other systems reviewed and are negative.  The following is a summary of the past history medically, past history surgically, known current medicines, social history and family history.  This information is gathered electronically by the computer from prior information and documentation.  I review this each visit and have found including this information at this point in the chart is beneficial and informative.    Past Medical History:  Diagnosis Date  . Anxiety   . Aortic stenosis   . Arthritis   . Asthma   . Back pain   . Cellulitis of skin with lymphangitis   . CHF (congestive heart  failure) ([place])   . Chronic diastolic congestive heart failure ([place])   . Chronic venous insufficiency   . Constipation   . COPD (chronic obstructive pulmonary disease) ([place])   . Depression   . Dyspnea   . Essential hypertension   . Gout   . Heart murmur   . Hypertension   . Morbid obesity ([place])   . Obesity   . Pneumonia    walking pneumonia  . Pre-diabetes   . Prostatitis   . Pulmonary embolism ([place])   . Pulmonary hypertension ([place])   . Pyelonephritis   . S/P aortic valve replacement with bioprosthetic valve 08/23/2017   25 mm Edwards Inspiris Resilia stented bovine pericardial tissue valve  . 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 Sleep apnea    cpap   . Thoracic ascending aortic aneurysm ([place])   . Thoracic ascending aortic aneurysm ([place])   . Tobacco abuse     Past Surgical History:  Procedure Laterality Date  . AORTIC VALVE REPLACEMENT N/A 08/23/2017   Procedure: AORTIC VALVE REPLACEMENT (AVR) USING INSPIRIS RESILIA AORTIC VALVE SIZE 25 MM;  Surgeon: Rexene Alberts, MD;  Location: [facility];  Service: Open Heart Surgery;  Laterality: N/A;  . MULTIPLE EXTRACTIONS WITH ALVEOLOPLASTY N/A 07/23/2017   Procedure: Extraction of tooth #10 with alveoloplasty and gross debridement of remaining teeth;  Surgeon: Lenn Cal, DDS;  Location: [facility];  Service: Oral Surgery;  Laterality: N/A;  . NO PAST SURGERIES    . PACEMAKER IMPLANT N/A  08/28/2017    [employer] Jude [employer] Assurity MRI conditional  dual-chamber pacemaker for symptomatic complete heart blockby Dr Rayann Heman  . TEE WITHOUT CARDIOVERSION N/A 07/16/2017   Procedure: TRANSESOPHAGEAL ECHOCARDIOGRAM (TEE);  Surgeon: Lelon Perla, MD;  [facility] ENDOSCOPY;  Service: Cardiovascular;  Laterality: N/A;  . TEE WITHOUT CARDIOVERSION N/A 08/23/2017   Procedure: TRANSESOPHAGEAL ECHOCARDIOGRAM (TEE);  Surgeon: Rexene Alberts, MD;  Location: [facility];  Service: Open Heart Surgery;   Laterality: N/A;  . THORACIC AORTIC ANEURYSM REPAIR N/A 08/23/2017   Procedure: THORACIC ASCENDING ANEURYSM REPAIR (AAA) USING HEMASHIELD GOLD KNITTED MICROVEL DOUBLE VELOUR VASCULAR GRAFT D: 24 MM  L: 30 CM;  Surgeon: Rexene Alberts, MD;  Location: [facility];  Service: Open Heart Surgery;  Laterality: N/A;    Family History  Problem Relation Age of Onset  . Hypertension Mother   . Alzheimer's disease Mother   . Heart attack Brother   . Diabetes Brother     Social History Social History   Tobacco Use  . Smoking status: Former Smoker    Packs/day: 2.00    Years: 16.00    Pack years: 32.00    Types: Cigarettes    Quit date: 08/23/2017    Years since quitting: 1.9  . Smokeless tobacco: Never Used  Substance Use Topics  . Alcohol use: No    Comment: have had alcohol in the past, not heavy  . Drug use: No    No Known Allergies  Current Outpatient Medications  Medication Sig Dispense Refill  . acetaminophen (TYLENOL) 325 MG tablet Take 650 mg by mouth every 6 (six) hours as needed for mild pain.    Marland Kitchen albuterol (PROVENTIL) (2.5 MG/3ML) 0.083% nebulizer solution INHALE 1 VIAL VIA NEBULIZER EVERY 6 HOURS AS NEEDED FOR WHEEZING OR SHORTNESS OF BREATH 270 mL 1  . allopurinol (ZYLOPRIM) 300 MG tablet TAKE 1 Tablet BY MOUTH ONCE EVERY DAY 90 tablet 1  . busPIRone (BUSPAR) 10 MG tablet Take 10 mg by mouth 3 (three) times daily.     . citalopram (CELEXA) 40 MG tablet Take 40 mg by mouth daily.     Marland Kitchen docusate sodium (COLACE) 100 MG capsule Take 200 mg by mouth daily as needed for mild constipation.     . furosemide (LASIX) 80 MG tablet Take 1 tablet (80 mg total) by mouth 2 (two) times daily. 180 tablet 3  . icosapent Ethyl (VASCEPA) 1 g capsule Take 2 capsules (2 g total) by mouth daily. 120 capsule 0  . Krill Oil 350 MG CAPS Take 1 capsule by mouth daily.    Marland Kitchen loratadine (CLARITIN) 10 MG tablet Take 10 mg by mouth daily as needed for allergies.    . metFORMIN (GLUCOPHAGE) 500 MG tablet Take  1 tablet (500 mg total) by mouth 2 (two) times daily with a meal. 180 tablet 1  . metolazone (ZAROXOLYN) 5 MG tablet Take 1 tablet (5 mg total) by mouth 2 (two) times a week. (Patient taking differently: Take 5 mg by mouth once a week. ) 30 tablet 1  . metoprolol tartrate (LOPRESSOR) 100 MG tablet TAKE 1 Tablet  BY MOUTH TWICE DAILY 180 tablet 0  . mometasone-formoterol (DULERA) 200-5 MCG/ACT AERO INHALE 2 PUFFS BY MOUTH TWICE DAILY. RINSE MOUTH AFTER EACH USE 39 g 1  . potassium chloride (MICRO-K) 10 MEQ CR capsule TAKE 3 Capsules BY MOUTH ONCE DAILY EVERY MONDAY, WEDNESDAY AND FRIDAY AND TAKE 1 Capsule BY MOUTH ONCE DAILY ON ALL OTHER DAYS 55  capsule 3  . PROVENTIL HFA 108 (90 Base) MCG/ACT inhaler INHALE 2 PUFFS BY MOUTH EVERY 6 HOURS AS NEEDED FOR COUGHING, WHEEZING, OR SHORTNESS OF BREATH 6.7 g 1  . XARELTO 20 MG TABS tablet TAKE 1 Tablet BY MOUTH ONCE EVERY DAY WITH SUPPER 90 tablet 0   No current facility-administered medications for this visit.     Physical Exam  Blood pressure 128/74, pulse 65, height 5\' 11"  (1.803 m), weight (!) 477 lb (216.4 kg).  Constitutional: overall normal hygiene, normal nutrition, well developed, normal grooming, normal body habitus. Assistive device:cane  Musculoskeletal: gait and station Limp left, muscle tone and strength are normal, no tremors or atrophy is present.  .  Neurological: coordination overall normal.  Deep tendon reflex/nerve stretch intact.  Sensation normal.  Cranial nerves II-XII intact.   Skin:   Normal overall no scars, lesions, ulcers or rashes. No psoriasis.  Psychiatric: Alert and oriented x 3.  Recent memory intact, remote memory unclear.  Normal mood and affect. Well groomed.  Good eye contact.  Cardiovascular: overall no swelling, no varicosities, no edema bilaterally, normal temperatures of the legs and arms, no clubbing, cyanosis and good capillary refill.  Lymphatic: palpation is normal.  Left knee with effusion,  crepitus, ROM 0 to 100, medial pain, positive medial [place], [place] intact.  All other systems reviewed and are negative   The patient has been educated about the nature of the problem(s) and counseled on treatment options.  The patient appeared to understand what I have discussed and is in agreement with it.  Encounter Diagnoses  Name Primary?  . Chronic pain of left knee Yes  . Pacemaker   . Pulmonary hypertension ([place])   . Chronic venous insufficiency   . Sleep apnea, unspecified type   . Body mass index 60.0-69.9, adult ([place])   . Morbid obesity ([place])     PLAN Call if any problems.  Precautions discussed.  Continue current medications.   Return to clinic to see Dr. Aline Brochure    Electronically Signed Sanjuana Kava, MD 3/18/20218:17 AM

## 2019-08-12 ENCOUNTER — Ambulatory Visit (INDEPENDENT_AMBULATORY_CARE_PROVIDER_SITE_OTHER): Payer: Self-pay | Admitting: Orthopedic Surgery

## 2019-08-12 ENCOUNTER — Other Ambulatory Visit: Payer: Self-pay

## 2019-08-12 ENCOUNTER — Encounter: Payer: Self-pay | Admitting: Orthopedic Surgery

## 2019-08-12 VITALS — BP 141/84 | HR 84 | Ht 71.0 in | Wt >= 6400 oz

## 2019-08-12 DIAGNOSIS — G8929 Other chronic pain: Secondary | ICD-10-CM

## 2019-08-12 DIAGNOSIS — M25562 Pain in left knee: Secondary | ICD-10-CM

## 2019-08-12 DIAGNOSIS — M23322 Other meniscus derangements, posterior horn of medial meniscus, left knee: Secondary | ICD-10-CM

## 2019-08-12 DIAGNOSIS — M1710 Unilateral primary osteoarthritis, unspecified knee: Secondary | ICD-10-CM

## 2019-08-12 DIAGNOSIS — Z6841 Body Mass Index (BMI) 40.0 and over, adult: Secondary | ICD-10-CM

## 2019-08-12 NOTE — Patient Instructions (Addendum)
You will get a call from [facility] office [facility]) for appointment they are [place], the phone number is [phone]    Meniscal Cyst  A meniscal cyst is a fluid-filled growth that forms on a crescent-shaped wedge of cartilage in the knee (meniscus). Two menisci are located in each knee. They sit between the upper bone (femur, or thighbone) and lower bone (tibia, or shinbone) that make up the knee joint. The menisci cushion the knee and keep it stable. A meniscal cyst can cause knee pain and limit movement of your knee. What are the causes? In most cases, a meniscal cyst develops after a knee injury that involves a tear in a meniscus. The tear allows the fluid inside the knee joint (synovial fluid) to leak out and then form the cyst. What increases the risk? You are more likely to develop a meniscal cyst if you have a knee injury in which a piece of the meniscus is torn. These injuries often occur from:  Contact sports.  Sports or activities that involve squatting, twisting, or pivoting movements. As people get older, their meniscus gets thinner and weaker. Then, tears can happen more easily, even from simply climbing stairs. What are the signs or symptoms? Symptoms of this condition include:  A bump on either side of your knee, where the knee bones come together.  Knee pain that gets worse with movement and better with rest.  Swelling.  Stiffness.  A feeling that your knee is locking, catching, or giving way. How is this diagnosed? This condition may be diagnosed based on:  Your symptoms and medical history, including any previous injuries.  A physical exam. During the exam, your health care provider may: ? Feel for a lump that pops in and out of your knee joint with movement. ? Check your knee for tenderness, strength, movement, and stability.  Imaging tests, such as an MRI or ultrasound. How is this treated? Treatment for this  condition may depend on your symptoms and how they affect you. Treatment options include:  Resting.  NSAIDs to reduce pain and swelling.  Physical therapy.  Wearing a knee brace or elastic wrap for support.  An injection of medicine (steroid) into the cyst.  A procedure to drain fluid from the cyst with a needle (aspiration).  Surgery to remove the cyst and possibly remove part of the meniscus. Follow these instructions at home: If you have a knee brace or elastic wrap:  Wear the brace or wrap as told by your health care provider. Remove it only as told by your health care provider.  Loosen the brace or wrap if your toes tingle, become numb, or turn cold and blue.  Keep the brace or wrap clean and dry. Managing pain, stiffness, and swelling   If directed, put ice on the affected area: ? Put ice in a plastic bag. ? Place a towel between your skin and the bag. ? Leave the ice on for 20 minutes, 2-3 times a day.  Move your toes often to avoid stiffness and to lessen swelling.  Raise (elevate) the injured area above the level of your heart while you are sitting or lying down. Activity  Rest and return to your normal activities as told by your health care provider. Avoid activities that make pain or swelling worse. Ask your health care provider what activities are safe for you.  If you were told by your health care provider to keep weight off your knee, use  crutches or a cane as directed.  Begin doing exercises to strengthen your knee and leg muscles only as told by your health care provider. General instructions  Take over-the-counter and prescription medicines only as told by your health care provider.  Do not use any products that contain nicotine or tobacco, such as cigarettes and e-cigarettes. These can delay healing. If you need help quitting, ask your health care provider.  Keep all follow-up visits as told by your health care provider. This is  important. Prevention To help prevent a meniscal cyst from coming back:  Do not play sports after an injury until your health care provider says it is okay to do so.  Do not train too hard. If you have pain or swelling, stop and rest.  Ask your health care provider to design a stretching program for you. Follow it as directed.  Use proper equipment when playing sports and exercising.  Wear shoes that are right for your activity.  Wear a knee support during sports activities, as told by your health care provider.  Start new activities gradually. Contact a health care provider if:  You have pain that is getting worse.  You have chills or a fever.  Your knee brace gets damaged.  Your knee feels numb.  You notice swelling or redness below your brace. Summary  The meniscus is a crescent-shaped wedge of cartilage in the knee. There are two in each knee. They cushion the knee and keep it stable.  A meniscal cyst is a fluid-filled growth that forms on a meniscus.  A meniscal cyst can cause knee pain and limit movement of the knee.  You are more likely to develop a meniscal cyst if you have a knee injury in which a piece of the meniscus is torn. This information is not intended to replace advice given to you by your health care provider. Make sure you discuss any questions you have with your health care provider. Document Revised: 05/21/2017 Document Reviewed: 05/21/2017 Elsevier Patient Education  [place].  Meniscus Tear Surgery A meniscus is a wedge-shaped piece of cartilage inside the knee. Surgery for a meniscus tear is a procedure to treat a torn meniscus. The procedure is done either to remove the torn part of the meniscus or to repair the meniscus with stitches (sutures). Tell a health care provider about:  Any allergies you have.  All medicines you are taking, including vitamins, herbs, eye drops, creams, and over-the-counter medicines.  Any problems you or  family members have had with anesthetic medicines.  Any blood disorders you have.  Any surgeries you have had.  Any medical conditions you have or have had.  Whether you are pregnant or may be pregnant. What are the risks? Generally, this is a safe procedure. However, problems may occur, including:  Bleeding.  Infection.  Allergic reactions to medicines, including anesthetics.  Damage to nerves, blood vessels, or other structures of the knee.  Pain that does not go away, or pain or stiffness after surgery.  A blood clot that forms in a leg and travels to the lungs (pulmonary embolism). What happens before the procedure? Staying hydrated Follow instructions from your health care provider about hydration, which may include:  Up to 2 hours before the procedure - you may continue to drink clear liquids, such as water, clear fruit juice, black coffee, and plain tea. Eating and drinking Follow instructions from your health care provider about eating and drinking, which may include:  8 hours  before the procedure - stop eating heavy meals or foods, such as meat, fried foods, or fatty foods.  6 hours before the procedure - stop eating light meals or foods, such as toast or cereal.  6 hours before the procedure - stop drinking milk or drinks that contain milk.  2 hours before the procedure - stop drinking clear liquids. Medicines Ask your health care provider about:  Changing or stopping your regular medicines. This is especially important if you are taking diabetes medicines or blood thinners.  Taking medicines such as aspirin and ibuprofen. These medicines can thin your blood. Do not take these medicines unless your health care provider tells you to take them.  Taking over-the-counter medicines, vitamins, herbs, and supplements. General instructions  Do not use any products that contain nicotine or tobacco for at least 4 weeks before the procedure. These products include  cigarettes, e-cigarettes, and chewing tobacco. If you need help quitting, ask your health care provider.  Plan to have someone take you home from the hospital or clinic.  If you will be going home right after the procedure, plan to have someone with you for 24 hours.  Ask your health care provider: ? How your surgery site will be marked or identified. ? What steps will be taken to help prevent infection. These may include:  Removing hair at the surgery site.  Washing skin with a germ-killing soap.  Taking antibiotic medicine. What happens during the procedure?   An IV will be inserted into one of your veins.  You will be given one or more of the following: ? A medicine to help you relax (sedative). ? A medicine to make you fall asleep (general anesthetic). ? A medicine that is injected into an area of your body to numb everything below and slightly above the injection site (regional anesthetic).  A cuff may be placed around your upper leg to slow blood flow to your lower leg during the procedure.  Your surgeon will make small incisions around your knee.  A clear, germ-free fluid will be injected into your knee through one incision to help your surgeon see inside the joint.  A thin, flexible, tube-like instrument with a camera on the end (arthroscope) will be inserted through another incision. The camera will send images to a screen in the operating room. Your surgeon will use these images as a guide during surgery.  Surgical instruments will be inserted through the other incisions.  The torn pieces of your meniscus will be removed, or the edges of the tear will be repaired with sutures.  The instruments and the scope will be removed.  Your knee joint will be flushed out with fluid.  The incisions will be closed using small sutures or adhesive strips.  A bandage (dressing) will be placed over your knee, and another dressing will be wrapped around your knee. The procedure  may vary among health care providers and hospitals. What happens after the procedure?  Your blood pressure, heart rate, breathing rate, and blood oxygen level will be monitored until you leave the hospital or clinic.  You will be given medicine to control pain.  You may be fitted with a knee brace and given crutches or a walker.  You may be shown how to do physical therapy exercises to help you recover.  Do not drive for 24 hours if you were given a sedative during your procedure. Summary  A meniscus is a wedge-shaped piece of cartilage inside the knee. Surgery for  a meniscus tear is a procedure to treat a torn meniscus.  Follow instructions from your health care provider about eating and drinking before the procedure.  Follow instructions from your health care provider about changing or stopping your regular medicines. This is especially important if you are taking diabetes medicines or blood thinners.  After the procedure a bandage (dressing) will be placed over your knee. Another dressing will be wrapped around your knee.  You will be given medicine to control pain. You may be fitted with a knee brace and given crutches or a walker. This information is not intended to replace advice given to you by your health care provider. Make sure you discuss any questions you have with your health care provider. Document Revised: 02/04/2018 Document Reviewed: 02/04/2018 Elsevier Patient Education  [place].

## 2019-08-12 NOTE — Progress Notes (Signed)
Chief Complaint  Patient presents with  . Knee Pain    left surgical consult     HPI: 49 YO MALE is post aortic valve replacement repair of thoracic aneurysm Neri embolism COPD   Presents with a torn medial meniscus left knee; Reginald [name] reports some intermittent pain in his knee but he was getting in a truck something popped in severe pain he said it actually made him get sick almost and then after that he had continued pain on the medial joint line with locking and feels like the knee is giving out and slipping in and out of place.  He had 2 cortisone injections eventually went for an MRI which showed arthritis of the knee and torn meniscus CLINICAL DATA:  Chronic left knee pain and instability.   EXAM: MRI OF THE LEFT KNEE WITHOUT CONTRAST   TECHNIQUE: Multiplanar, multisequence MR imaging of the knee was performed. No intravenous contrast was administered.   COMPARISON:  CT left knee arthrogram dated July 25, 2019.   FINDINGS: MENISCI   Medial meniscus: Unchanged complete radial tear through the posterior root. No extrusion.   Lateral meniscus: Tiny oblique undersurface tear of the anterior horn.   LIGAMENTS   Cruciates:  Intact ACL and PCL.   Collaterals: Medial collateral ligament is intact. Lateral collateral ligament complex is intact.   CARTILAGE   Patellofemoral: Full-thickness cartilage loss over the lateral patellar facet. Small full-thickness delamination over the patellar apex.   Medial:  No chondral defect.   Lateral: Unchanged small focal full-thickness cartilage defect over the mesial aspect of the lateral tibial plateau.   Joint: Trace joint effusion. Normal Hoffa's fat. No plical thickening.   Popliteal Fossa:  Small Baker cyst. Intact popliteus tendon.   Extensor Mechanism: Intact quadriceps tendon and patellar tendon. Intact medial and lateral patellar retinaculum. Intact MPFL.   Bones: No acute fracture or dislocation. Degenerative  subchondral marrow edema in the peripheral medial tibial plateau. Red marrow hyperplasia.   Other: None.   IMPRESSION: 1. Unchanged complete radial tear through the medial meniscus posterior root. No extrusion. 2. Tiny oblique undersurface tear of the lateral meniscus anterior horn. 3. Patellofemoral compartment osteoarthritis.     Electronically Signed   By: Titus Dubin M.D.   On: 08/06/2019 09:08  Past Medical History:  Diagnosis Date  . Anxiety   . Aortic stenosis   . Arthritis   . Asthma   . Back pain   . Cellulitis of skin with lymphangitis   . CHF (congestive heart failure) ([place])   . Chronic diastolic congestive heart failure ([place])   . Chronic venous insufficiency   . Constipation   . COPD (chronic obstructive pulmonary disease) ([place])   . Depression   . Dyspnea   . Essential hypertension   . Gout   . Heart murmur   . Hypertension   . Morbid obesity ([place])   . Obesity   . Pneumonia    walking pneumonia  . Pre-diabetes   . Prostatitis   . Pulmonary embolism ([place])   . Pulmonary hypertension ([place])   . Pyelonephritis   . S/P aortic valve replacement with bioprosthetic valve 08/23/2017   25 mm Edwards Inspiris Resilia stented bovine pericardial tissue valve  . 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 Sleep apnea    cpap   . Thoracic ascending aortic aneurysm ([place])   . Thoracic ascending aortic aneurysm ([place])   . Tobacco abuse  Past Surgical History:  Procedure Laterality Date  . AORTIC VALVE REPLACEMENT N/A 08/23/2017   Procedure: AORTIC VALVE REPLACEMENT (AVR) USING INSPIRIS RESILIA AORTIC VALVE SIZE 25 MM;  Surgeon: Rexene Alberts, MD;  Location: [place];  Service: Open Heart Surgery;  Laterality: N/A;  . MULTIPLE EXTRACTIONS WITH ALVEOLOPLASTY N/A 07/23/2017   Procedure: Extraction of tooth #10 with alveoloplasty and gross debridement of remaining teeth;  Surgeon: Lenn Cal, DDS;  Location: [place];  Service: Oral Surgery;  Laterality: N/A;  . NO PAST SURGERIES    . PACEMAKER IMPLANT N/A 08/28/2017    [employer] Jude [employer] Assurity MRI conditional  dual-chamber pacemaker for symptomatic complete heart blockby Dr Rayann Heman  . TEE WITHOUT CARDIOVERSION N/A 07/16/2017   Procedure: TRANSESOPHAGEAL ECHOCARDIOGRAM (TEE);  Surgeon: Lelon Perla, MD;  Location: [facility] ENDOSCOPY;  Service: Cardiovascular;  Laterality: N/A;  . TEE WITHOUT CARDIOVERSION N/A 08/23/2017   Procedure: TRANSESOPHAGEAL ECHOCARDIOGRAM (TEE);  Surgeon: Rexene Alberts, MD;  Location: [place];  Service: Open Heart Surgery;  Laterality: N/A;  . THORACIC AORTIC ANEURYSM REPAIR N/A 08/23/2017   Procedure: THORACIC ASCENDING ANEURYSM REPAIR (AAA) USING HEMASHIELD GOLD KNITTED MICROVEL DOUBLE VELOUR VASCULAR GRAFT D: 24 MM  L: 30 CM;  Surgeon: Rexene Alberts, MD;  Location: [place];  Service: Open Heart Surgery;  Laterality: N/A;   Social History   Tobacco Use  . Smoking status: Former Smoker    Packs/day: 2.00    Years: 16.00    Pack years: 32.00    Types: Cigarettes    Quit date: 08/23/2017    Years since quitting: 1.9  . Smokeless tobacco: Never Used  Substance Use Topics  . Alcohol use: No    Comment: have had alcohol in the past, not heavy  . Drug use: No    BP (!) 141/84   Pulse 84   Ht 5\' 11"  (1.803 m)   Wt (!) 479 lb (217.3 kg)   BMI 66.81 kg/m   Body mass index is 66.81 kg/m. The patient meets the AMA guidelines for Morbid (severe) obesity with a BMI > 40.0 and I have recommended weight loss.  Obviously large obese male walks with a cane pleasant pleasant mood and affect oriented x3.  He walks with a wide-based gait he is limping favoring his injured left knee  He does have tenderness over the medial joint line I do not detect an effusion he has chronic bilateral pitting edema with discoloration of the skin but no obvious inflammatory response at this time in his lower legs  From what we could manage we did get him  to full extension and about 90 degrees of flexion  AP stability was normal  Imaging x-ray outside film 4 views of the left knee no evidence of fracture dislocation no effusion no arthropathy no osteophytes  Seems to have a little bit of joint space narrowing but it symmetric  MRI shows a lateral meniscal tear as well as multiple areas of full-thickness to partial-thickness cartilage loss  Encounter Diagnoses  Name Primary?  . Chronic pain of left knee Yes  . Derangement of posterior horn of medial meniscus of left knee   . Primary localized osteoarthritis of knee   . Body mass index 60.0-69.9, adult ([place])   . Morbid obesity ([place])    Reginald [name] has difficult medical problems least of which are his history of be his chronic use of Xarelto pacemaker COPD severe obesity.  He is not a candidate for surgery  at [facility] but may be a candidate for surgery at [facility].  We will make a referral to see if he can have surgery with one of the doctors who works out at home

## 2019-08-18 NOTE — Addendum Note (Signed)
Encounter addended by: Gaetana Michaelis B on: 08/18/2019 3:07 PM  Actions taken: Imaging Exam ended

## 2019-08-21 ENCOUNTER — Ambulatory Visit (INDEPENDENT_AMBULATORY_CARE_PROVIDER_SITE_OTHER): Payer: Self-pay

## 2019-08-21 ENCOUNTER — Other Ambulatory Visit: Payer: Self-pay

## 2019-08-21 ENCOUNTER — Encounter: Payer: Self-pay | Admitting: Orthopedic Surgery

## 2019-08-21 ENCOUNTER — Ambulatory Visit (INDEPENDENT_AMBULATORY_CARE_PROVIDER_SITE_OTHER): Payer: Self-pay | Admitting: Orthopedic Surgery

## 2019-08-21 DIAGNOSIS — M25512 Pain in left shoulder: Secondary | ICD-10-CM

## 2019-08-21 NOTE — Progress Notes (Signed)
Office Visit Note   Patient: Reginald [name]           Date of Birth: 05-Apr-1971           MRN: [id number] Visit Date: 08/21/2019 Requested by: Reginald [name], [address] [place],  [place] [id number] PCP: Reginald Dryer, PA-C  Subjective: Chief Complaint  Patient presents with  . Left Knee - Pain    HPI: Reginald [name] is a patient with left knee pain and left shoulder pain.  He has had pain for several months.  Twisted his knee getting into his truck about 2 months ago and felt a pop.  Reports weakness pain mechanical symptoms and swelling in that left knee.  Does have a history of diabetes with hemoglobin A1c 7.23 months ago.  Has a history of hypertension.  Height is 511 and weight is 475.  He is on Xarelto.  Has a heart valve pacemaker and history of pulmonary embolism.  He also takes Xarelto.  Patient also describes left shoulder pain for about 20 years.  Describes a catching in that left shoulder.  No discrete weakness but does report mechanical symptoms in the left shoulder.  He has been using a cane for assistive walking.  He does have a realistic outlook about his medical condition and suitability for any surgical intervention.  He lives alone.              ROS: All systems reviewed are negative as they relate to the chief complaint within the history of present illness.  Patient denies  fevers or chills.   Assessment & Plan: Visit Diagnoses:  1. Left shoulder pain, unspecified chronicity     Plan: Impression is left knee meniscal root avulsion in a patient with some underlying mild arthritis which is likely to become severe within the next 5 years.  I do not think meniscal root repair is indicated in this patient based on his medical condition other medical problems in weight.  Maintaining the nonweightbearing status required for this would be difficult if not impossible for Reginald [name].  I also think at his current weight that the procedure has an exceedingly low chance of  success.  Regarding the shoulder I think there are mechanical symptoms in the shoulder which conceivably could be addressed surgically if they become severe.  Patient is requesting further work-up of the shoulder so based on exam and radiographic findings nonarthrogram MRI is ordered.  Evaluate rotator cuff tear versus biceps tendinopathy/SLAP tear.  See him back after that study.  Follow-Up Instructions: Return for after MRI.   Orders:  Orders Placed This Encounter  Procedures  . XR Shoulder Left  . MR Shoulder Left w/o contrast   No orders of the defined types were placed in this encounter.     Procedures: No procedures performed   Clinical Data: No additional findings.  Objective: Vital Signs: There were no vitals taken for this visit.  Physical Exam:   Constitutional: Patient appears well-developed HEENT:  Head: Normocephalic Eyes:EOM are normal Neck: Normal range of motion Cardiovascular: Normal rate Pulmonary/chest: Effort normal Neurologic: Patient is alert Skin: Skin is warm Psychiatric: Patient has normal mood and affect    Ortho Exam: Ortho exam demonstrates increased body mass index.  Antalgic gait to the left.  Has about slightly less than 90 degrees of flexion in both knees.  Venous stasis changes present in both lower extremities.  Negative Homans today.  Mild effusion left knee no effusion right knee.  Collateral crucial ligaments are stable.  Extensor mechanism is intact bilaterally with no groin pain on either side with internal or external rotation of the hip.  Left shoulder is examined.  Does have a little bit of coarse grinding with internal extra rotation at 90 degrees of abduction.  Negative O'Brien's testing.  Rotator cuff strength is good infraspinatus supraspinatus and subscap muscle testing.  No restriction of external rotation of 15 degrees of abduction.  Specialty Comments:  No specialty comments available.  Imaging: XR Shoulder  Left  Result Date: 08/21/2019 AP outlet axillary left shoulder reviewed.  Pacemaker is visualized.  Visualized lung fields clear.  Shoulder is reduced.  No acute fracture.  Acromiohumeral distance intact.  No significant glenohumeral joint arthritis is present.    PMFS History: Patient Active Problem List   Diagnosis Date Noted  . OSA on CPAP   . Abnormal liver function 03/16/2019  . History of pulmonary embolism 03/16/2019  . Paroxysmal atrial fibrillation ([facility]) 09/05/2018  . Chronic obstructive pulmonary disease ([facility]) 05/07/2018  . Gastroesophageal reflux disease   . Pulmonary embolism ([facility]) 09/18/2017  . Encounter for therapeutic drug monitoring 09/04/2017  . 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 08/23/2017  . 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 Pulmonary hypertension ([facility])   . Chronic periodontitis 07/18/2017  . Essential hypertension   . Tobacco abuse   . Chronic heart failure with preserved ejection fraction (HFpEF) ([facility])   . Chronic venous insufficiency    Past Medical History:  Diagnosis Date  . Anxiety   . Aortic stenosis   . Arthritis   . Asthma   . Back pain   . Cellulitis of skin with lymphangitis   . CHF (congestive heart failure) ([facility])   . Chronic diastolic congestive heart failure ([facility])   . Chronic venous insufficiency   . Constipation   . COPD (chronic obstructive pulmonary disease) ([facility])   . Depression   . Dyspnea   . Essential hypertension   . Gout   . Heart murmur   . Hypertension   . Morbid obesity ([facility])   . Obesity   . Pneumonia    walking pneumonia  . Pre-diabetes   . Prostatitis   . Pulmonary embolism ([facility])   . Pulmonary hypertension ([facility])   . Pyelonephritis   . S/P aortic valve replacement with bioprosthetic valve 08/23/2017   25 mm Edwards Inspiris Resilia stented bovine pericardial tissue valve  . 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   24 mm Hemashield  supracoronary straight graft   . Sleep apnea    cpap   . Thoracic ascending aortic aneurysm ([facility])   . Thoracic ascending aortic aneurysm ([facility])   . Tobacco abuse     Family History  Problem Relation Age of Onset  . Hypertension Mother   . Alzheimer's disease Mother   . Heart attack Brother   . Diabetes Brother     Past Surgical History:  Procedure Laterality Date  . AORTIC VALVE REPLACEMENT N/A 08/23/2017   Procedure: AORTIC VALVE REPLACEMENT (AVR) USING INSPIRIS RESILIA AORTIC VALVE SIZE 25 MM;  Surgeon: Rexene Alberts, MD;  Location: [facility];  Service: Open Heart Surgery;  Laterality: N/A;  . MULTIPLE EXTRACTIONS WITH ALVEOLOPLASTY N/A 07/23/2017   Procedure: Extraction of tooth #10 with alveoloplasty and gross debridement of remaining teeth;  Surgeon: Lenn Cal, DDS;  Location: [facility];  Service: Oral Surgery;  Laterality: N/A;  . NO PAST SURGERIES    .  PACEMAKER IMPLANT N/A 08/28/2017    St Jude Medical Assurity MRI conditional  dual-chamber pacemaker for symptomatic complete heart blockby Dr Rayann Heman  . TEE WITHOUT CARDIOVERSION N/A 07/16/2017   Procedure: TRANSESOPHAGEAL ECHOCARDIOGRAM (TEE);  Surgeon: Lelon Perla, MD;  Location[facility] ENDOSCOPY;  Service: Cardiovascular;  Laterality: N/A;  . TEE WITHOUT CARDIOVERSION N/A 08/23/2017   Procedure: TRANSESOPHAGEAL ECHOCARDIOGRAM (TEE);  Surgeon: Rexene Alberts, MD;  Location: [facility];  Service: Open Heart Surgery;  Laterality: N/A;  . THORACIC AORTIC ANEURYSM REPAIR N/A 08/23/2017   Procedure: THORACIC ASCENDING ANEURYSM REPAIR (AAA) USING HEMASHIELD GOLD KNITTED MICROVEL DOUBLE VELOUR VASCULAR GRAFT D: 24 MM  L: 30 CM;  Surgeon: Rexene Alberts, MD;  Location: [facility];  Service: Open Heart Surgery;  Laterality: N/A;   Social History   Occupational History  . Not on file  Tobacco Use  . Smoking status: Former Smoker    Packs/day: 2.00    Years: 16.00    Pack years: 32.00    Types: Cigarettes    Quit date: 08/23/2017    Years  since quitting: 1.9  . Smokeless tobacco: Never Used  Substance and Sexual Activity  . Alcohol use: No    Comment: have had alcohol in the past, not heavy  . Drug use: No  . Sexual activity: Not on file

## 2019-08-27 ENCOUNTER — Ambulatory Visit (INDEPENDENT_AMBULATORY_CARE_PROVIDER_SITE_OTHER): Payer: Self-pay | Admitting: *Deleted

## 2019-08-27 DIAGNOSIS — I480 Paroxysmal atrial fibrillation: Secondary | ICD-10-CM

## 2019-08-27 LAB — CUP PACEART REMOTE DEVICE CHECK
Battery Remaining Longevity: 129 mo
Battery Remaining Percentage: 95.5 %
Battery Voltage: 3.02 V
Brady Statistic AP VP Percent: 1 %
Brady Statistic AP VS Percent: 3.6 %
Brady Statistic AS VP Percent: 1.6 %
Brady Statistic AS VS Percent: 95 %
Brady Statistic RA Percent Paced: 3.6 %
Brady Statistic RV Percent Paced: 1.7 %
Date Time Interrogation Session: 20210407033413
Implantable Lead Implant Date: 20190409
Implantable Lead Implant Date: 20190409
Implantable Lead Location: 753859
Implantable Lead Location: 753860
Implantable Pulse Generator Implant Date: 20190409
Lead Channel Impedance Value: 600 Ohm
Lead Channel Impedance Value: 600 Ohm
Lead Channel Pacing Threshold Amplitude: 0.75 V
Lead Channel Pacing Threshold Amplitude: 0.75 V
Lead Channel Pacing Threshold Pulse Width: 0.5 ms
Lead Channel Pacing Threshold Pulse Width: 0.5 ms
Lead Channel Sensing Intrinsic Amplitude: 5 mV
Lead Channel Sensing Intrinsic Amplitude: 6.8 mV
Lead Channel Setting Pacing Amplitude: 2 V
Lead Channel Setting Pacing Amplitude: 2.5 V
Lead Channel Setting Pacing Pulse Width: 0.5 ms
Lead Channel Setting Sensing Sensitivity: 2 mV
Pulse Gen Model: 2272
Pulse Gen Serial Number: 9010865

## 2019-08-27 NOTE — Progress Notes (Signed)
PPM Remote  

## 2019-09-04 ENCOUNTER — Ambulatory Visit: Payer: Self-pay | Admitting: Physician Assistant

## 2019-09-04 ENCOUNTER — Encounter: Payer: Self-pay | Admitting: Physician Assistant

## 2019-09-04 VITALS — BP 148/73 | HR 76 | Wt >= 6400 oz

## 2019-09-04 DIAGNOSIS — R0602 Shortness of breath: Secondary | ICD-10-CM

## 2019-09-04 DIAGNOSIS — R05 Cough: Secondary | ICD-10-CM

## 2019-09-04 DIAGNOSIS — R059 Cough, unspecified: Secondary | ICD-10-CM

## 2019-09-04 DIAGNOSIS — Z20822 Contact with and (suspected) exposure to covid-19: Secondary | ICD-10-CM

## 2019-09-04 NOTE — Progress Notes (Signed)
   BP (!) 148/73   Pulse 76   Wt (!) 474 lb (215 kg)   SpO2 98%   BMI 66.11 kg/m    Subjective:    Patient ID: Reginald [name], male    DOB: 1970-07-02, 49 y.o.   MRN: [id number]  HPI: Reginald [name] is a 49 y.o. male presenting on 09/04/2019 for No chief complaint on file.   HPI  This is a telemedicine appointment through [employer] due to coronavirus pandemic.  I connected with  Reginald [name] on 09/04/19 by a video enabled telemedicine application and verified that I am speaking with the correct person using two identifiers.   I discussed the limitations of evaluation and management by telemedicine. The patient expressed understanding and agreed to proceed.  Pt is at home.  Provider is at office.    Pt reports Cough and trouble breathing started Monday.  Coughs up some phlegm.   Not checking temperature.  He uses nebuslizer that makes him cough up more phelgm and helps a little with his breathing.   His O2 is 98 now but was 92% an hour ago.  It hasn't gotten below 90.    No known exposures to covid.  He does go to grocery but denies recent gathering.  He did go to [employer].  He says he wore a mask.  That was Friday and Saturday.   Relevant past medical, surgical, family and social history reviewed and updated as indicated. Interim medical history since our last visit reviewed. Allergies and medications reviewed and updated.  Review of Systems  Per HPI unless specifically indicated above     Objective:    BP (!) 148/73   Pulse 76   Wt (!) 474 lb (215 kg)   SpO2 98%   BMI 66.11 kg/m   Wt Readings from Last 3 Encounters:  09/04/19 (!) 474 lb (215 kg)  08/12/19 (!) 479 lb (217.3 kg)  08/07/19 (!) 477 lb (216.4 kg)    Physical Exam Constitutional:      General: He is not in acute distress.    Appearance: He is obese.  HENT:     Head: Normocephalic and atraumatic.  Pulmonary:     Effort: No respiratory distress.     Comments: Pt with increased respiratory effort with  talking. Neurological:     Mental Status: He is alert and oriented to person, place, and time.  Psychiatric:        Behavior: Behavior normal.           Assessment & Plan:   Encounter Diagnoses  Name Primary?  . Cough Yes  . SOB (shortness of breath)   . Suspected COVID-19 virus infection       -pt was Scheduled for covid test -pt counseled on Quarantining -pt to Use nebulizers as needed. -discussed reasons he should go to ER if neeeded -pt will follow up in office in 2-3 weeks as scheduled.  Contact office sooner prn -

## 2019-09-05 ENCOUNTER — Ambulatory Visit: Payer: Self-pay | Attending: Internal Medicine

## 2019-09-05 ENCOUNTER — Other Ambulatory Visit: Payer: Self-pay

## 2019-09-05 DIAGNOSIS — Z20822 Contact with and (suspected) exposure to covid-19: Secondary | ICD-10-CM | POA: Insufficient documentation

## 2019-09-06 LAB — SARS-COV-2, NAA 2 DAY TAT

## 2019-09-06 LAB — NOVEL CORONAVIRUS, NAA: SARS-CoV-2, NAA: NOT DETECTED

## 2019-09-08 ENCOUNTER — Telehealth: Payer: Self-pay | Admitting: Physician Assistant

## 2019-09-08 NOTE — Telephone Encounter (Signed)
Called pt and discussed negative covid test results.

## 2019-09-15 ENCOUNTER — Other Ambulatory Visit: Payer: Self-pay | Admitting: Physician Assistant

## 2019-09-15 DIAGNOSIS — R7989 Other specified abnormal findings of blood chemistry: Secondary | ICD-10-CM

## 2019-09-15 DIAGNOSIS — E1165 Type 2 diabetes mellitus with hyperglycemia: Secondary | ICD-10-CM

## 2019-09-15 DIAGNOSIS — I10 Essential (primary) hypertension: Secondary | ICD-10-CM

## 2019-09-16 NOTE — Progress Notes (Signed)
Cardiology Office Note    Date:  09/17/2019   ID:  Reginald [name], DOB 1971/04/07, MRN [id number]  PCP:  [name], PA-C  Cardiologist: Kate Sable, MD   EP: Dr. Rayann Heman  Chief Complaint  Patient presents with  . Follow-up    3 month visit    History of Present Illness:    Reginald [name] is a 49 y.o. male with past medical history of severe AS (s/p bovine tissue AVR in 08/2017 with repair of ascending thoracic aortic aneurysm),normal coronary arteries by cardiac catheterization in 2019, paroxysmal atrial fibrillation, CHB(s/p PPM placement in 08/2017, history of bilateral PE, HTN, morbid obesity and OSA who presents to the office today for 31-month follow-up.  He was last examined myself in 05/2019 and reported having chronic dyspnea on exertion but denied any acute changes in this. No recent chest pain or orthopnea. He was consuming 6-8 bottles of water daily and it was recommended to limit his fluid intake to less than 2 L daily. He was continued on Lasix 80 mg twice daily (previously unable to afford Torsemide) and Metolazone 5 mg once weekly with instructions to take an extra tablet if worsening edema or weight gain.  In talking with the patient today, he reports weight fluctuations by over 20 pounds within a week. He was able to obtain home scales and says that his weight was in the 470's last week but it increased to over 490 lbs on his home scales this past Sunday and he took an extra Metolazone tablet. He does set up at the flea market on the weekends and reports still taking his fluid pills at that time but feels like he retains significant fluid over the course of the 2 days. He does develop some discomfort in his chest with this. Denies any exertional pain. No recent orthopnea or PND.  He does utilize his CPAP on a nightly basis.  He reports good compliance with Xarelto and denies missing any doses. No recent melena, hematochezia or hematuria. He does have significant  arthritis along his knees bilaterally and has been utilizing Tylenol with minimal improvement in symptoms. Reports Ibuprofen typically helps with symptoms but he is aware this is not ideal in the setting of being on an anticoagulant.    Past Medical History:  Diagnosis Date  . Anxiety   . Aortic stenosis   . Arthritis   . Asthma   . Back pain   . Cellulitis of skin with lymphangitis   . CHF (congestive heart failure) ([place])   . Chronic diastolic congestive heart failure ([place])   . Chronic venous insufficiency   . Constipation   . COPD (chronic obstructive pulmonary disease) ([place])   . Depression   . Dyspnea   . Essential hypertension   . Gout   . Heart murmur   . Hypertension   . Morbid obesity ([place])   . Obesity   . Pneumonia    walking pneumonia  . Pre-diabetes   . Prostatitis   . Pulmonary embolism ([place])   . Pulmonary hypertension ([place])   . Pyelonephritis   . S/P aortic valve replacement with bioprosthetic valve 08/23/2017   25 mm Edwards Inspiris Resilia stented bovine pericardial tissue valve  . 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 Sleep apnea    cpap   . Thoracic ascending aortic aneurysm ([place])   . Thoracic ascending aortic aneurysm ([place])   . Tobacco abuse  Past Surgical History:  Procedure Laterality Date  . AORTIC VALVE REPLACEMENT N/A 08/23/2017   Procedure: AORTIC VALVE REPLACEMENT (AVR) USING INSPIRIS RESILIA AORTIC VALVE SIZE 25 MM;  Surgeon: Rexene Alberts, MD;  Location: [facility];  Service: Open Heart Surgery;  Laterality: N/A;  . MULTIPLE EXTRACTIONS WITH ALVEOLOPLASTY N/A 07/23/2017   Procedure: Extraction of tooth #10 with alveoloplasty and gross debridement of remaining teeth;  Surgeon: Lenn Cal, DDS;  Location: [facility];  Service: Oral Surgery;  Laterality: N/A;  . NO PAST SURGERIES    . PACEMAKER IMPLANT N/A 08/28/2017    St Jude Medical Assurity MRI conditional  dual-chamber pacemaker for  symptomatic complete heart blockby Dr Rayann Heman  . TEE WITHOUT CARDIOVERSION N/A 07/16/2017   Procedure: TRANSESOPHAGEAL ECHOCARDIOGRAM (TEE);  Surgeon: Lelon Perla, MD;  [facility] ENDOSCOPY;  Service: Cardiovascular;  Laterality: N/A;  . TEE WITHOUT CARDIOVERSION N/A 08/23/2017   Procedure: TRANSESOPHAGEAL ECHOCARDIOGRAM (TEE);  Surgeon: Rexene Alberts, MD;  Location: [facility];  Service: Open Heart Surgery;  Laterality: N/A;  . THORACIC AORTIC ANEURYSM REPAIR N/A 08/23/2017   Procedure: THORACIC ASCENDING ANEURYSM REPAIR (AAA) USING HEMASHIELD GOLD KNITTED MICROVEL DOUBLE VELOUR VASCULAR GRAFT D: 24 MM  L: 30 CM;  Surgeon: Rexene Alberts, MD;  Location: [facility];  Service: Open Heart Surgery;  Laterality: N/A;    Current Medications: Outpatient Medications Prior to Visit  Medication Sig Dispense Refill  . acetaminophen (TYLENOL) 325 MG tablet Take 650 mg by mouth every 6 (six) hours as needed for mild pain.    Marland Kitchen albuterol (PROVENTIL) (2.5 MG/3ML) 0.083% nebulizer solution INHALE 1 VIAL VIA NEBULIZER EVERY 6 HOURS AS NEEDED FOR WHEEZING OR SHORTNESS OF BREATH 270 mL 1  . allopurinol (ZYLOPRIM) 300 MG tablet TAKE 1 Tablet BY MOUTH ONCE EVERY DAY 90 tablet 1  . busPIRone (BUSPAR) 10 MG tablet Take 10 mg by mouth 3 (three) times daily.     . citalopram (CELEXA) 40 MG tablet Take 40 mg by mouth daily.     Marland Kitchen docusate sodium (COLACE) 100 MG capsule Take 200 mg by mouth daily as needed for mild constipation.     . furosemide (LASIX) 80 MG tablet Take 1 tablet (80 mg total) by mouth 2 (two) times daily. 180 tablet 3  . icosapent Ethyl (VASCEPA) 1 g capsule Take 2 capsules (2 g total) by mouth daily. 120 capsule 0  . Krill Oil 350 MG CAPS Take 1 capsule by mouth daily.    Marland Kitchen loratadine (CLARITIN) 10 MG tablet Take 10 mg by mouth daily as needed for allergies.    . metFORMIN (GLUCOPHAGE) 500 MG tablet Take 1 tablet (500 mg total) by mouth 2 (two) times daily with a meal. 180 tablet 1  . metolazone  (ZAROXOLYN) 5 MG tablet Take 1 tablet (5 mg total) by mouth 2 (two) times a week. (Patient taking differently: Take 5 mg by mouth once a week. ) 30 tablet 1  . metoprolol tartrate (LOPRESSOR) 100 MG tablet TAKE 1 Tablet  BY MOUTH TWICE DAILY 180 tablet 0  . mometasone-formoterol (DULERA) 200-5 MCG/ACT AERO INHALE 2 PUFFS BY MOUTH TWICE DAILY. RINSE MOUTH AFTER EACH USE 39 g 1  . potassium chloride (MICRO-K) 10 MEQ CR capsule TAKE 3 Capsules BY MOUTH ONCE DAILY EVERY MONDAY, WEDNESDAY AND FRIDAY AND TAKE 1 Capsule BY MOUTH ONCE DAILY ON ALL OTHER DAYS 55 capsule 3  . PROVENTIL HFA 108 (90 Base) MCG/ACT inhaler INHALE 2 PUFFS BY MOUTH EVERY  6 HOURS AS NEEDED FOR COUGHING, WHEEZING, OR SHORTNESS OF BREATH 6.7 g 1  . XARELTO 20 MG TABS tablet TAKE 1 Tablet BY MOUTH ONCE EVERY DAY WITH SUPPER 90 tablet 0   No facility-administered medications prior to visit.     Allergies:   Patient has no known allergies.   Social History   Socioeconomic History  . Marital status: Divorced    Spouse name: Not on file  . Number of children: Not on file  . Years of education: Not on file  . Highest education level: Not on file  Occupational History  . Not on file  Tobacco Use  . Smoking status: Former Smoker    Packs/day: 2.00    Years: 16.00    Pack years: 32.00    Types: Cigarettes    Quit date: 08/23/2017    Years since quitting: 2.0  . Smokeless tobacco: Never Used  Substance and Sexual Activity  . Alcohol use: No    Comment: have had alcohol in the past, not heavy  . Drug use: No  . Sexual activity: Not on file  Other Topics Concern  . Not on file  Social History Narrative  . Not on file   Social Determinants of Health   Financial Resource Strain:   . Difficulty of Paying Living Expenses:   Food Insecurity:   . Worried About Charity fundraiser in the Last Year:   . Arboriculturist in the Last Year:   Transportation Needs:   . Film/video editor (Medical):   Marland Kitchen Lack of Transportation  (Non-Medical):   Physical Activity:   . Days of Exercise per Week:   . Minutes of Exercise per Session:   Stress:   . Feeling of Stress :   Social Connections:   . Frequency of Communication with Friends and Family:   . Frequency of Social Gatherings with Friends and Family:   . Attends Religious Services:   . Active Member of Clubs or Organizations:   . Attends Archivist Meetings:   Marland Kitchen Marital Status:      Family History:  [name] family history includes Alzheimer's disease in his mother; Diabetes in his brother; Heart attack in his brother; Hypertension in his mother.   Review of Systems:   Please see the history of present illness.     General:  No chills, fever, night sweats or weight changes.  Cardiovascular:  No chest pain, orthopnea, palpitations, paroxysmal nocturnal dyspnea. Positive for edema and dyspnea on exertion.  Dermatological: No rash, lesions/masses Respiratory: No cough, dyspnea Urologic: No hematuria, dysuria Abdominal:   No nausea, vomiting, diarrhea, bright red blood per rectum, melena, or hematemesis Neurologic:  No visual changes, wkns, changes in mental status. All other systems reviewed and are otherwise negative except as noted above.   Physical Exam:    VS:  BP (!) 144/60   Pulse 70   Temp (!) 96.5 F (35.8 C)   Ht 5\' 11"  (1.803 m)   Wt (!) 482 lb (218.6 kg)   SpO2 95%   BMI 67.23 kg/m    General: Well developed, obese male appearing in no acute distress. Head: Normocephalic, atraumatic, sclera non-icteric.  Neck: No carotid bruits. JVD difficult to assess given body habitus.  Lungs: Respirations regular and unlabored, without wheezes or rales.  Heart: Regular rate and rhythm. No S3 or S4.  No rubs or gallops appreciated. 2/6 SEM along RUSB.  Abdomen: Soft, non-tender, non-distended. No obvious abdominal  masses. Msk:  Strength and tone appear normal for age. No obvious joint deformities or effusions. Extremities: No clubbing  or cyanosis. Chronic 1+ pitting edema.  Distal pedal pulses are 2+ bilaterally. Neuro: Alert and oriented X 3. Moves all extremities spontaneously. No focal deficits noted. Psych:  Responds to questions appropriately with a normal affect. Skin: No rashes or lesions noted  Wt Readings from Last 3 Encounters:  09/17/19 (!) 482 lb (218.6 kg)  09/04/19 (!) 474 lb (215 kg)  08/12/19 (!) 479 lb (217.3 kg)     Studies/Labs Reviewed:   EKG:  EKG is not ordered today.   Recent Labs: 03/08/2019: TSH 3.834 03/15/2019: B Natriuretic Peptide 40.0 03/17/2019: Magnesium 2.3 03/24/2019: Hemoglobin 13.1; Platelets 331 06/18/2019: ALT 65; BUN 25; Creatinine, Ser 0.99; Potassium 3.5; Sodium 137   Lipid Panel    Component Value Date/Time   CHOL 179 06/18/2019 0915   TRIG 320 (H) 06/18/2019 0915   HDL 28 (L) 06/18/2019 0915   CHOLHDL 6.4 06/18/2019 0915   VLDL 64 (H) 06/18/2019 0915   LDLCALC 87 06/18/2019 0915    Additional studies/ records that were reviewed today include:   Echocardiogram: 02/2019 IMPRESSIONS    1. Left ventricular ejection fraction, by visual estimation, is 60 to  65%. The left ventricle has normal function. Normal left ventricular size.  There is no left ventricular hypertrophy.  2. Global right ventricle has mildly reduced systolic function.The right  ventricular size is not well visualized. No increase in right ventricular  wall thickness.  3. Left atrial size was not well visualized.  4. Right atrial size was not well visualized.  5. The mitral valve was not well visualized. No evidence of mitral valve  regurgitation. No evidence of mitral stenosis.  6. The tricuspid valve is not well visualized. Tricuspid valve  regurgitation is trivial.  7. Poorly visualized Bioprosthetic aortic valve valve is present in the  aortic position.  8. Pulmonic valve poorly visualized.  9. S/P Ascending thoraici aortic aneurysm repair. Aortic root and  ascending aorta  poorly visualized.  10. Mildly elevated pulmonary artery systolic pressure.   Assessment:    1. Chronic diastolic congestive heart failure ([place])   2. Hx of aortic valve replacement   3. PAF (paroxysmal atrial fibrillation) ([place])   4. CHB (complete heart block) ([facility])   5. Essential hypertension   6. OSA (obstructive sleep apnea)      Plan:   In order of problems listed above:  1. Chronic Diastolic CHF - His weight has been variable as outlined above and he becomes very symptomatic with this. He was previously unable to afford Torsemide and has remained on Lasix 80 mg twice daily and typically takes Metolazone 5 mg once weekly. We reviewed that he can take an extra Metolazone tablet today as weight is still 5 to 7 pounds above his prior baseline. I have encouraged him to continue taking Metolazone at least once weekly and up to twice weekly if needed based off his weight gain. He is trying to limit his sodium intake but still consumes a significant amount of fluids due to feeling dehydrated. Labs were drawn by his PCP earlier today and will review results once available.  2. Severe AS  - s/p bovine tissue AVR in 08/2017 with repair of ascending thoracic aortic aneurysm. Echocardiogram last year showed the valve was poorly visualized but no significant abnormalities noted.  3. Paroxysmal Atrial Fibrillation - He denies any recent palpitations and remains on Lopressor 100  mg twice daily for rate control.   - He denies any evidence of active bleeding. Remains on Xarelto 20 mg daily for anticoagulation.  Repeat labs were obtained earlier today by his PCP with results currently pending.  4. CHB - s/p PPM placement in 08/2017 which is followed by Dr. Rayann Heman.  Device interrogation earlier this month showed normal device function.  5. HTN - BP was elevated at 144/60 during today's visit and has been variable when checked at home with SBP ranging from the 120's to 140's. I did encourage him to  continue to keep a record of this. He is currently on Lopressor 100 mg twice daily and could add ACE-I or ARB in the future if indicated. His BP has been well controlled at prior office visits I suspect his volume overload is playing a role as well.  6.  OSA - Continued compliance with CPAP encouraged.   Medication Adjustments/Labs and Tests Ordered: Current medicines are reviewed at length with the patient today.  Concerns regarding medicines are outlined above.  Medication changes, Labs and Tests ordered today are listed in the Patient Instructions below. Patient Instructions  Medication Instructions:  Your physician recommends that you continue on your current medications as directed. Please refer to the Current Medication list given to you today.  Take Metolazone today then resume taking weekly   *If you need a refill on your cardiac medications before your next appointment, please call your pharmacy*   Lab Work: NONE  If you have labs (blood work) drawn today and your tests are completely normal, you will receive your results only by: Marland Kitchen MyChart Message (if you have MyChart) OR . A paper copy in the mail If you have any lab test that is abnormal or we need to change your treatment, we will call you to review the results.   Testing/Procedures: NONE    Follow-Up: At [facility], you and your health needs are our priority.  As part of our continuing mission to provide you with exceptional heart care, we have created designated Provider Care Teams.  These Care Teams include your primary Cardiologist (physician) and Advanced Practice Providers (APPs -  Physician Assistants and Nurse Practitioners) who all work together to provide you with the care you need, when you need it.  We recommend signing up for the patient portal called "MyChart".  Sign up information is provided on this After Visit Summary.  MyChart is used to connect with patients for Virtual Visits (Telemedicine).   Patients are able to view lab/test results, encounter notes, upcoming appointments, etc.  Non-urgent messages can be sent to your provider as well.   To learn more about what you can do with MyChart, go to [email or web address].    Your next appointment:   3-4 month(s)  The format for your next appointment:   In Person  Provider:   You may see Kate Sable, MD or one of the following Advanced Practice Providers on your designated Care Team:    Bernerd Pho, PA-C   Ermalinda Barrios, PA-C     Other Instructions Thank you for choosing [facility]!     Signed, Erma Heritage, PA-C  09/17/2019 5:43 PM    [facility] [address] [place], [place] [address] Phone: [phone] Fax: [phone]

## 2019-09-17 ENCOUNTER — Encounter: Payer: Self-pay | Admitting: Student

## 2019-09-17 ENCOUNTER — Ambulatory Visit (INDEPENDENT_AMBULATORY_CARE_PROVIDER_SITE_OTHER): Payer: Self-pay | Admitting: Student

## 2019-09-17 ENCOUNTER — Other Ambulatory Visit (HOSPITAL_COMMUNITY)
Admission: RE | Admit: 2019-09-17 | Discharge: 2019-09-17 | Disposition: A | Discharge status: Home / Self Care | Payer: Self-pay | Source: Ambulatory Visit | Attending: Physician Assistant | Admitting: Physician Assistant

## 2019-09-17 VITALS — BP 144/60 | HR 70 | Temp 96.5°F | Ht 71.0 in | Wt >= 6400 oz

## 2019-09-17 DIAGNOSIS — R7989 Other specified abnormal findings of blood chemistry: Secondary | ICD-10-CM | POA: Insufficient documentation

## 2019-09-17 DIAGNOSIS — I10 Essential (primary) hypertension: Secondary | ICD-10-CM | POA: Insufficient documentation

## 2019-09-17 DIAGNOSIS — I480 Paroxysmal atrial fibrillation: Secondary | ICD-10-CM

## 2019-09-17 DIAGNOSIS — G4733 Obstructive sleep apnea (adult) (pediatric): Secondary | ICD-10-CM

## 2019-09-17 DIAGNOSIS — I5032 Chronic diastolic (congestive) heart failure: Secondary | ICD-10-CM

## 2019-09-17 DIAGNOSIS — Z952 Presence of prosthetic heart valve: Secondary | ICD-10-CM

## 2019-09-17 DIAGNOSIS — I442 Atrioventricular block, complete: Secondary | ICD-10-CM

## 2019-09-17 DIAGNOSIS — E1165 Type 2 diabetes mellitus with hyperglycemia: Secondary | ICD-10-CM | POA: Insufficient documentation

## 2019-09-17 LAB — COMPREHENSIVE METABOLIC PANEL
ALT: 49 U/L — ABNORMAL HIGH (ref 0–44)
AST: 73 U/L — ABNORMAL HIGH (ref 15–41)
Albumin: 3.7 g/dL (ref 3.5–5.0)
Alkaline Phosphatase: 66 U/L (ref 38–126)
Anion gap: 15 (ref 5–15)
BUN: 27 mg/dL — ABNORMAL HIGH (ref 6–20)
CO2: 29 mmol/L (ref 22–32)
Calcium: 9.2 mg/dL (ref 8.9–10.3)
Chloride: 93 mmol/L — ABNORMAL LOW (ref 98–111)
Creatinine, Ser: 1.03 mg/dL (ref 0.61–1.24)
GFR calc Af Amer: >60 mL/min (ref >60)
GFR calc non Af Amer: >60 mL/min (ref >60)
Glucose, Bld: 169 mg/dL — ABNORMAL HIGH (ref 70–99)
Potassium: 3.4 mmol/L — ABNORMAL LOW (ref 3.5–5.1)
Sodium: 137 mmol/L (ref 135–145)
Total Bilirubin: 1.1 mg/dL (ref 0.3–1.2)
Total Protein: 8.3 g/dL — ABNORMAL HIGH (ref 6.5–8.1)

## 2019-09-17 LAB — HEMOGLOBIN A1C
Hgb A1c MFr Bld: 6.6 % — ABNORMAL HIGH (ref 4.8–5.6)
Mean Plasma Glucose: 142.72 mg/dL

## 2019-09-17 NOTE — Patient Instructions (Signed)
Medication Instructions:  Your physician recommends that you continue on your current medications as directed. Please refer to the Current Medication list given to you today.  Take Metolazone today then resume taking weekly   *If you need a refill on your cardiac medications before your next appointment, please call your pharmacy*   Lab Work: NONE  If you have labs (blood work) drawn today and your tests are completely normal, you will receive your results only by: Marland Kitchen MyChart Message (if you have MyChart) OR . A paper copy in the mail If you have any lab test that is abnormal or we need to change your treatment, we will call you to review the results.   Testing/Procedures: NONE    Follow-Up: At [facility], you and your health needs are our priority.  As part of our continuing mission to provide you with exceptional heart care, we have created designated Provider Care Teams.  These Care Teams include your primary Cardiologist (physician) and Advanced Practice Providers (APPs -  Physician Assistants and Nurse Practitioners) who all work together to provide you with the care you need, when you need it.  We recommend signing up for the patient portal called "MyChart".  Sign up information is provided on this After Visit Summary.  MyChart is used to connect with patients for Virtual Visits (Telemedicine).  Patients are able to view lab/test results, encounter notes, upcoming appointments, etc.  Non-urgent messages can be sent to your provider as well.   To learn more about what you can do with MyChart, go to [email or web address].    Your next appointment:   3-4 month(s)  The format for your next appointment:   In Person  Provider:   You may see Kate Sable, MD or one of the following Advanced Practice Providers on your designated Care Team:    Bernerd Pho, PA-C   Ermalinda Barrios, PA-C     Other Instructions Thank you for choosing [facility]!  ]

## 2019-09-18 ENCOUNTER — Telehealth: Payer: Self-pay | Admitting: Student

## 2019-09-18 LAB — MICROALBUMIN, URINE: Microalb, Ur: 21.3 ug/mL — ABNORMAL HIGH

## 2019-09-18 NOTE — Telephone Encounter (Signed)
    Please let the patient know I reviewed the labs that were obtained by his PCP. Kidney function remains stable with creatinine at 1.03. His potassium levels were slightly low at 3.4.  Would recommend he take an additional 20 mEq of K-dur for the next two days then resume his usual dosing.  Signed, Erma Heritage, PA-C 09/18/2019, 9:58 AM Pager: [phone]

## 2019-09-18 NOTE — Telephone Encounter (Signed)
Pt notified and voiced understanding 

## 2019-09-22 ENCOUNTER — Other Ambulatory Visit: Payer: Self-pay | Admitting: Physician Assistant

## 2019-09-22 MED ORDER — ADVAIR HFA 115-21 MCG/ACT IN AERO: 2.0000 | INHALATION_SPRAY | Freq: Two times a day (BID) | RESPIRATORY_TRACT | 1 refills | Status: DC

## 2019-09-22 MED ORDER — FLUTICASONE-SALMETEROL 250-50 MCG/DOSE IN AEPB: 1.0000 | INHALATION_SPRAY | Freq: Two times a day (BID) | RESPIRATORY_TRACT | 1 refills | Status: DC

## 2019-09-22 MED ORDER — METOPROLOL TARTRATE 100 MG PO TABS: 100.0000 mg | ORAL_TABLET | Freq: Two times a day (BID) | ORAL | 0 refills | Status: DC

## 2019-09-23 ENCOUNTER — Encounter: Payer: Self-pay | Admitting: Physician Assistant

## 2019-09-23 ENCOUNTER — Ambulatory Visit: Payer: Self-pay | Admitting: Physician Assistant

## 2019-09-23 ENCOUNTER — Other Ambulatory Visit: Payer: Self-pay

## 2019-09-23 VITALS — BP 130/68 | HR 62 | Temp 97.7°F

## 2019-09-23 DIAGNOSIS — F39 Unspecified mood [affective] disorder: Secondary | ICD-10-CM

## 2019-09-23 DIAGNOSIS — E119 Type 2 diabetes mellitus without complications: Secondary | ICD-10-CM

## 2019-09-23 DIAGNOSIS — R7989 Other specified abnormal findings of blood chemistry: Secondary | ICD-10-CM

## 2019-09-23 DIAGNOSIS — I10 Essential (primary) hypertension: Secondary | ICD-10-CM

## 2019-09-23 DIAGNOSIS — Z7901 Long term (current) use of anticoagulants: Secondary | ICD-10-CM

## 2019-09-23 DIAGNOSIS — E781 Pure hyperglyceridemia: Secondary | ICD-10-CM

## 2019-09-23 DIAGNOSIS — G8929 Other chronic pain: Secondary | ICD-10-CM

## 2019-09-23 MED ORDER — GABAPENTIN 100 MG PO CAPS: 100.0000 mg | ORAL_CAPSULE | Freq: Two times a day (BID) | ORAL | 3 refills | Status: DC | PRN

## 2019-09-23 NOTE — Progress Notes (Signed)
BP 130/68   Pulse 62   Temp 97.7 F (36.5 C)   SpO2 98%    Subjective:    Patient ID: Reginald [name], male    DOB: 1971-05-12, 49 y.o.   MRN: [id number]  HPI: Reginald [name] is a 49 y.o. male presenting on 09/23/2019 for Follow-up   HPI   Pt had a negative covid 107 screening [place].      Pt is pleasant 65yoM with super moribd obesity, DM, CHF, PAF, AS, HTN, OSA, and mood disorder.  He says he is doing "Rough"- due to recent allergies He only has 2 wk left of vascepa samples  He is still going to [facility]- he says his mood isn't good.  He denies SI, HI.   He continues to see cardiology for his CHF and orthopedics for his knee pain.       Relevant past medical, surgical, family and social history reviewed and updated as indicated. Interim medical history since our last visit reviewed. Allergies and medications reviewed and updated.   Current Outpatient Medications:  .  acetaminophen (TYLENOL) 325 MG tablet, Take 650 mg by mouth every 6 (six) hours as needed for mild pain., Disp: , Rfl:  .  albuterol (PROVENTIL) (2.5 MG/3ML) 0.083% nebulizer solution, INHALE 1 VIAL VIA NEBULIZER EVERY 6 HOURS AS NEEDED FOR WHEEZING OR SHORTNESS OF BREATH, Disp: 270 mL, Rfl: 1 .  allopurinol (ZYLOPRIM) 300 MG tablet, TAKE 1 Tablet BY MOUTH ONCE EVERY DAY, Disp: 90 tablet, Rfl: 1 .  busPIRone (BUSPAR) 10 MG tablet, Take 10 mg by mouth 3 (three) times daily. , Disp: , Rfl:  .  citalopram (CELEXA) 40 MG tablet, Take 40 mg by mouth daily. , Disp: , Rfl:  .  docusate sodium (COLACE) 100 MG capsule, Take 200 mg by mouth daily as needed for mild constipation. , Disp: , Rfl:  .  furosemide (LASIX) 80 MG tablet, Take 1 tablet (80 mg total) by mouth 2 (two) times daily., Disp: 180 tablet, Rfl: 3 .  ibuprofen (ADVIL) 200 MG tablet, Take 200 mg by mouth 2 (two) times daily as needed., Disp: , Rfl:  .  icosapent Ethyl (VASCEPA) 1 g capsule, Take 2 capsules (2 g total) by mouth daily., Disp: 120  capsule, Rfl: 0 .  Krill Oil 350 MG CAPS, Take 1 capsule by mouth daily., Disp: , Rfl:  .  loratadine (CLARITIN) 10 MG tablet, Take 10 mg by mouth daily as needed for allergies., Disp: , Rfl:  .  metFORMIN (GLUCOPHAGE) 500 MG tablet, Take 1 tablet (500 mg total) by mouth 2 (two) times daily with a meal., Disp: 180 tablet, Rfl: 1 .  metolazone (ZAROXOLYN) 5 MG tablet, Take 1 tablet (5 mg total) by mouth 2 (two) times a week. (Patient taking differently: Take 5 mg by mouth once a week. ), Disp: 30 tablet, Rfl: 1 .  metoprolol tartrate (LOPRESSOR) 100 MG tablet, Take 1 tablet (100 mg total) by mouth 2 (two) times daily., Disp: 60 tablet, Rfl: 0 .  mometasone-formoterol (DULERA) 200-5 MCG/ACT AERO, INHALE 2 PUFFS BY MOUTH TWICE DAILY. RINSE MOUTH AFTER EACH USE, Disp: 39 g, Rfl: 1 .  potassium chloride (MICRO-K) 10 MEQ CR capsule, TAKE 3 Capsules BY MOUTH ONCE DAILY EVERY MONDAY, WEDNESDAY AND FRIDAY AND TAKE 1 Capsule BY MOUTH ONCE DAILY ON ALL OTHER DAYS, Disp: 55 capsule, Rfl: 3 .  PROVENTIL HFA 108 (90 Base) MCG/ACT inhaler, INHALE 2 PUFFS BY MOUTH EVERY 6 HOURS AS NEEDED  FOR COUGHING, WHEEZING, OR SHORTNESS OF BREATH, Disp: 6.7 g, Rfl: 1 .  XARELTO 20 MG TABS tablet, TAKE 1 Tablet BY MOUTH ONCE EVERY DAY WITH SUPPER, Disp: 90 tablet, Rfl: 0 .  Fluticasone-Salmeterol (ADVAIR DISKUS) 250-50 MCG/DOSE AEPB, Inhale 1 puff into the lungs 2 (two) times daily. (Patient not taking: Reported on 09/23/2019), Disp: 1 each, Rfl: 1   Review of Systems  Per HPI unless specifically indicated above     Objective:    BP 130/68   Pulse 62   Temp 97.7 F (36.5 C)   SpO2 98%   Wt Readings from Last 3 Encounters:  09/17/19 (!) 482 lb (218.6 kg)  09/04/19 (!) 474 lb (215 kg)  08/12/19 (!) 479 lb (217.3 kg)    Physical Exam Vitals reviewed.  Constitutional:      General: He is not in acute distress.    Appearance: He is well-developed. He is obese.  HENT:     Head: Normocephalic and atraumatic.   Cardiovascular:     Rate and Rhythm: Normal rate and regular rhythm.  Pulmonary:     Effort: Pulmonary effort is normal.     Breath sounds: Normal breath sounds. No wheezing.  Abdominal:     General: Bowel sounds are normal.     Palpations: Abdomen is soft.     Tenderness: There is no abdominal tenderness.  Musculoskeletal:     Cervical back: Neck supple.  Lymphadenopathy:     Cervical: No cervical adenopathy.  Skin:    General: Skin is warm and dry.  Neurological:     Mental Status: He is alert and oriented to person, place, and time.  Psychiatric:        Attention and Perception: Attention normal.        Speech: Speech normal.        Behavior: Behavior is cooperative.     Results for orders placed or performed during the hospital encounter of 09/17/19  Comprehensive metabolic panel  Result Value Ref Range   Sodium 137 135 - 145 mmol/L   Potassium 3.4 (L) 3.5 - 5.1 mmol/L   Chloride 93 (L) 98 - 111 mmol/L   CO2 29 22 - 32 mmol/L   Glucose, Bld 169 (H) 70 - 99 mg/dL   BUN 27 (H) 6 - 20 mg/dL   Creatinine, Ser 1.03 0.61 - 1.24 mg/dL   Calcium 9.2 8.9 - 10.3 mg/dL   Total Protein 8.3 (H) 6.5 - 8.1 g/dL   Albumin 3.7 3.5 - 5.0 g/dL   AST 73 (H) 15 - 41 U/L   ALT 49 (H) 0 - 44 U/L   Alkaline Phosphatase 66 38 - 126 U/L   Total Bilirubin 1.1 0.3 - 1.2 mg/dL   GFR calc non Af Amer >60 >60 mL/min   GFR calc Af Amer >60 >60 mL/min   Anion gap 15 5 - 15  Hemoglobin A1c  Result Value Ref Range   Hgb A1c MFr Bld 6.6 (H) 4.8 - 5.6 %   Mean Plasma Glucose 142.72 mg/dL  Microalbumin, urine  Result Value Ref Range   Microalb, Ur 21.3 (H) Not Estab. ug/mL      Assessment & Plan:    Encounter Diagnoses  Name Primary?  . Diabetes mellitus without complication ([place]) Yes  . Essential hypertension   . Elevated LFTs   . Super-super obese ([place])   . Anticoagulant long-term use   . Mood disorder ([place])   . Chronic knee pain, unspecified laterality   .  Hypertriglyceridemia        -reviewed labs with pt  -pt counseled to use  otc fish oil when vascepa runs out.  No more samples available  -Counseled against use of NSAIDS due to anticoagulant use and obvious bleeding when he has done this.  He is also counseled to avoid taking too much APAP as his LFTs are going up; he says he has been taking more than the bottle says.   Will start on low dose gabapentin for pain.    -pt to continue with cardiology, orthopedics and [facility]  -pt to Follow up 1 month to check on pain.  Pt to contact office sooner prn

## 2019-09-29 ENCOUNTER — Ambulatory Visit: Payer: Self-pay | Admitting: Nutrition

## 2019-10-28 ENCOUNTER — Other Ambulatory Visit: Payer: Self-pay

## 2019-10-28 ENCOUNTER — Encounter: Payer: Self-pay | Admitting: Physician Assistant

## 2019-10-28 ENCOUNTER — Ambulatory Visit: Payer: Self-pay | Admitting: Physician Assistant

## 2019-10-28 VITALS — BP 118/68 | HR 60 | Temp 96.6°F | Wt >= 6400 oz

## 2019-10-28 DIAGNOSIS — M25569 Pain in unspecified knee: Secondary | ICD-10-CM

## 2019-10-28 DIAGNOSIS — F39 Unspecified mood [affective] disorder: Secondary | ICD-10-CM

## 2019-10-28 NOTE — Progress Notes (Signed)
BP 118/68   Pulse 60   Temp (!) 96.6 F (35.9 C)   Wt (!) 486 lb (220.4 kg)   SpO2 97%   BMI 67.78 kg/m    Subjective:    Patient ID: Reginald [name], male    DOB: [birth year], 49 y.o.   MRN: [id number]  HPI: Reginald [name] is a 49 y.o. male presenting on 10/28/2019 for Knee Pain   HPI    Pt had a negative covid 19 screening questionnaire.      Pt was started on gabapentin at last OV to help with knee pain.  He says it helped.    He c/o panic attacks are worse lately.  He goes to [facility] for [facility] issues.      Relevant past medical, surgical, family and social history reviewed and updated as indicated. Interim medical history since our last visit reviewed. Allergies and medications reviewed and updated.   Current Outpatient Medications:  .  acetaminophen (TYLENOL) 325 MG tablet, Take 650 mg by mouth every 6 (six) hours as needed for mild pain., Disp: , Rfl:  .  albuterol (PROVENTIL) (2.5 MG/3ML) 0.083% nebulizer solution, INHALE 1 VIAL VIA NEBULIZER EVERY 6 HOURS AS NEEDED FOR WHEEZING OR SHORTNESS OF BREATH, Disp: 270 mL, Rfl: 1 .  allopurinol (ZYLOPRIM) 300 MG tablet, TAKE 1 Tablet BY MOUTH ONCE EVERY DAY, Disp: 90 tablet, Rfl: 1 .  busPIRone (BUSPAR) 10 MG tablet, Take 10 mg by mouth 3 (three) times daily. , Disp: , Rfl:  .  citalopram (CELEXA) 40 MG tablet, Take 40 mg by mouth daily. , Disp: , Rfl:  .  docusate sodium (COLACE) 100 MG capsule, Take 200 mg by mouth daily as needed for mild constipation. , Disp: , Rfl:  .  Fluticasone-Salmeterol (ADVAIR DISKUS) 250-50 MCG/DOSE AEPB, Inhale 1 puff into the lungs 2 (two) times daily., Disp: 1 each, Rfl: 1 .  furosemide (LASIX) 80 MG tablet, Take 1 tablet (80 mg total) by mouth 2 (two) times daily., Disp: 180 tablet, Rfl: 3 .  gabapentin (NEURONTIN) 100 MG capsule, Take 1 capsule (100 mg total) by mouth 2 (two) times daily as needed., Disp: 60 capsule, Rfl: 3 .  ibuprofen (ADVIL) 200 MG tablet, Take 200 mg by mouth 2 (two) times  daily as needed., Disp: , Rfl:  .  Krill Oil 350 MG CAPS, Take 1 capsule by mouth in the morning and at bedtime. , Disp: , Rfl:  .  loratadine (CLARITIN) 10 MG tablet, Take 10 mg by mouth daily as needed for allergies., Disp: , Rfl:  .  metFORMIN (GLUCOPHAGE) 500 MG tablet, Take 1 tablet (500 mg total) by mouth 2 (two) times daily with a meal., Disp: 180 tablet, Rfl: 1 .  metolazone (ZAROXOLYN) 5 MG tablet, Take 1 tablet (5 mg total) by mouth 2 (two) times a week. (Patient taking differently: Take 5 mg by mouth once a week. ), Disp: 30 tablet, Rfl: 1 .  metoprolol tartrate (LOPRESSOR) 100 MG tablet, Take 1 tablet (100 mg total) by mouth 2 (two) times daily., Disp: 60 tablet, Rfl: 0 .  potassium chloride (MICRO-K) 10 MEQ CR capsule, TAKE 3 Capsules BY MOUTH ONCE DAILY EVERY MONDAY, WEDNESDAY AND FRIDAY AND TAKE 1 Capsule BY MOUTH ONCE DAILY ON ALL OTHER DAYS, Disp: 55 capsule, Rfl: 3 .  PROVENTIL HFA 108 (90 Base) MCG/ACT inhaler, INHALE 2 PUFFS BY MOUTH EVERY 6 HOURS AS NEEDED FOR COUGHING, WHEEZING, OR SHORTNESS OF BREATH, Disp: 6.7 g, Rfl:  1 .  XARELTO 20 MG TABS tablet, TAKE 1 Tablet BY MOUTH ONCE EVERY DAY WITH SUPPER, Disp: 90 tablet, Rfl: 0    Review of Systems  Per HPI unless specifically indicated above     Objective:    BP 118/68   Pulse 60   Temp (!) 96.6 F (35.9 C)   Wt (!) 486 lb (220.4 kg)   SpO2 97%   BMI 67.78 kg/m   Wt Readings from Last 3 Encounters:  10/28/19 (!) 486 lb (220.4 kg)  09/17/19 (!) 482 lb (218.6 kg)  09/04/19 (!) 474 lb (215 kg)    Physical Exam Constitutional:      General: He is not in acute distress.    Appearance: He is obese.  HENT:     Head: Normocephalic and atraumatic.  Cardiovascular:     Rate and Rhythm: Normal rate and regular rhythm.  Pulmonary:     Effort: Pulmonary effort is normal. No respiratory distress.     Breath sounds: Normal breath sounds.  Neurological:     Mental Status: He is alert and oriented to person, place, and  time.     Comments: Ambulates with a cane  Psychiatric:        Attention and Perception: Attention normal.        Behavior: Behavior is cooperative.         Assessment & Plan:   Encounter Diagnoses  Name Primary?  . Chronic knee pain, unspecified laterality Yes  . Mood disorder ([place])      -pt to Continue gabapentin for knee pain -pt encouraged to contact [facility] for panic attacks -pt to follow up 2 months.  Pt to contact office sooner prn

## 2019-11-05 ENCOUNTER — Other Ambulatory Visit: Payer: Self-pay | Admitting: Physician Assistant

## 2019-11-06 ENCOUNTER — Other Ambulatory Visit: Payer: Self-pay | Admitting: Physician Assistant

## 2019-11-06 ENCOUNTER — Other Ambulatory Visit: Payer: Self-pay

## 2019-11-06 MED ORDER — METOPROLOL TARTRATE 100 MG PO TABS: 100.0000 mg | ORAL_TABLET | Freq: Two times a day (BID) | ORAL | 0 refills | Status: DC

## 2019-11-06 MED ORDER — RIVAROXABAN 20 MG PO TABS: ORAL_TABLET | ORAL | 0 refills | Status: DC

## 2019-11-06 MED ORDER — POTASSIUM CHLORIDE ER 10 MEQ PO CPCR: ORAL_CAPSULE | ORAL | 3 refills | Status: DC

## 2019-11-06 NOTE — Telephone Encounter (Signed)
Refilled potassium

## 2019-11-11 ENCOUNTER — Telehealth: Payer: Self-pay

## 2019-11-11 DIAGNOSIS — Z79899 Other long term (current) drug therapy: Secondary | ICD-10-CM

## 2019-11-11 NOTE — Telephone Encounter (Signed)
Pt feels he is taking lasix too often.  Please call [phone]   Thanks renee

## 2019-11-11 NOTE — Telephone Encounter (Signed)
Have never seen patient. Sees [place] , [place] and [place]. Would continue diuretics and have him seen in f/u Given weight hard to know what edema is worse from

## 2019-11-11 NOTE — Telephone Encounter (Signed)
Spoke with pt who states that he took Metolazone 5 mg on Monday. Pt states that after taking metolazone he is still SOB, legs and feet are still swollen and chest feels tight. Pt repots that he has had SOB all weekend. Pt reports BP as 167/76, HR 93 O2 sat of 96%. Current weight is 502 lbs. Pt states that the last time he weighed was last Wednesday and his weight was 478 lbs. Please advise.

## 2019-11-12 NOTE — Telephone Encounter (Signed)
     That is a significant weight gain as compared to his prior visit. Would take him take Metolazone 5mg  for the next 3 days in addition to his regular Lasix. Should take extra K-dur (30 mEq daily) for the next 3 days as well. Repeat BMET on Monday.   Signed, Erma Heritage, PA-C 11/12/2019, 7:40 AM Pager: [phone]

## 2019-11-12 NOTE — Telephone Encounter (Signed)
Pt notified orders placed  

## 2019-11-17 ENCOUNTER — Other Ambulatory Visit (HOSPITAL_COMMUNITY)
Admission: RE | Admit: 2019-11-17 | Discharge: 2019-11-17 | Disposition: A | Discharge status: Home / Self Care | Payer: Self-pay | Source: Ambulatory Visit | Attending: Student | Admitting: Student

## 2019-11-17 ENCOUNTER — Other Ambulatory Visit: Payer: Self-pay

## 2019-11-17 ENCOUNTER — Telehealth: Payer: Self-pay | Admitting: *Deleted

## 2019-11-17 DIAGNOSIS — Z79899 Other long term (current) drug therapy: Secondary | ICD-10-CM | POA: Insufficient documentation

## 2019-11-17 NOTE — Telephone Encounter (Signed)
Pt weights  11/11/19 503 lbs  11/12/19 499 lbs  11/13/19 494 lbs 11/14/19 482 lbs 11/15/19 471 lbs  11/16/19 479 lbs  11/17/19 474 lbs Pt states that he feels weak and tired. Pt c/o SOB at times.

## 2019-11-18 ENCOUNTER — Other Ambulatory Visit: Payer: Self-pay

## 2019-11-18 ENCOUNTER — Other Ambulatory Visit (HOSPITAL_COMMUNITY)
Admission: RE | Admit: 2019-11-18 | Discharge: 2019-11-18 | Disposition: A | Discharge status: Home / Self Care | Payer: Self-pay | Source: Ambulatory Visit | Attending: Student | Admitting: Student

## 2019-11-18 ENCOUNTER — Telehealth: Payer: Self-pay | Admitting: Student

## 2019-11-18 DIAGNOSIS — Z79899 Other long term (current) drug therapy: Secondary | ICD-10-CM

## 2019-11-18 LAB — BASIC METABOLIC PANEL
Anion gap: 15 (ref 5–15)
BUN: 29 mg/dL — ABNORMAL HIGH (ref 6–20)
CO2: 36 mmol/L — ABNORMAL HIGH (ref 22–32)
Calcium: 9.1 mg/dL (ref 8.9–10.3)
Chloride: 82 mmol/L — ABNORMAL LOW (ref 98–111)
Creatinine, Ser: 1.2 mg/dL (ref 0.61–1.24)
GFR calc Af Amer: >60 mL/min (ref >60)
GFR calc non Af Amer: >60 mL/min (ref >60)
Glucose, Bld: 381 mg/dL — ABNORMAL HIGH (ref 70–99)
Potassium: 2.6 mmol/L — CL (ref 3.5–5.1)
Sodium: 133 mmol/L — ABNORMAL LOW (ref 135–145)

## 2019-11-18 MED ORDER — POTASSIUM CHLORIDE CRYS ER 20 MEQ PO TBCR: EXTENDED_RELEASE_TABLET | ORAL | 3 refills | Status: DC

## 2019-11-18 NOTE — Telephone Encounter (Signed)
Noted  

## 2019-11-18 NOTE — Addendum Note (Signed)
Addended by: Levonne Hubert on: 11/18/2019 10:49 AM   Modules accepted: Orders

## 2019-11-18 NOTE — Telephone Encounter (Signed)
Pt states he went in again today to have labwork      Thanks renee

## 2019-11-18 NOTE — Telephone Encounter (Signed)
Pt notified and will come have labs done today.

## 2019-11-18 NOTE — Telephone Encounter (Signed)
    It appears his weight significantly improved with the use of Metolazone and is now back to his prior baseline. Would continue Lasix 80mg  BID and resume Metolazone at once weekly unless weight increases significantly. He should have a BMET this week to make sure renal function and electrolytes remain stable.   Signed, Erma Heritage, PA-C 11/18/2019, 8:03 AM Pager: [phone]

## 2019-11-18 NOTE — Telephone Encounter (Signed)
Critical lab called on pt of K+ 2.6. Reginald Pho, PA-C notified and verbal order given for K-Dur 60 meq today and tomorrow. Repeat Bmet on Thursday.   Pt notified and voiced understanding

## 2019-11-20 ENCOUNTER — Other Ambulatory Visit (HOSPITAL_COMMUNITY)
Admission: RE | Admit: 2019-11-20 | Discharge: 2019-11-20 | Disposition: A | Discharge status: Home / Self Care | Payer: Self-pay | Source: Ambulatory Visit | Attending: Student | Admitting: Student

## 2019-11-20 ENCOUNTER — Telehealth: Payer: Self-pay | Admitting: *Deleted

## 2019-11-20 ENCOUNTER — Other Ambulatory Visit: Payer: Self-pay

## 2019-11-20 DIAGNOSIS — Z79899 Other long term (current) drug therapy: Secondary | ICD-10-CM

## 2019-11-20 LAB — BASIC METABOLIC PANEL
Anion gap: 16 — ABNORMAL HIGH (ref 5–15)
BUN: 26 mg/dL — ABNORMAL HIGH (ref 6–20)
CO2: 35 mmol/L — ABNORMAL HIGH (ref 22–32)
Calcium: 9.1 mg/dL (ref 8.9–10.3)
Chloride: 82 mmol/L — ABNORMAL LOW (ref 98–111)
Creatinine, Ser: 1.06 mg/dL (ref 0.61–1.24)
GFR calc Af Amer: >60 mL/min (ref >60)
GFR calc non Af Amer: >60 mL/min (ref >60)
Glucose, Bld: 220 mg/dL — ABNORMAL HIGH (ref 70–99)
Potassium: 2.7 mmol/L — CL (ref 3.5–5.1)
Sodium: 133 mmol/L — ABNORMAL LOW (ref 135–145)

## 2019-11-20 NOTE — Telephone Encounter (Signed)
Critical potassium called from [facility] lab of 2.7.

## 2019-11-20 NOTE — Telephone Encounter (Signed)
Pt informed of new instructions. Pt voiced understanding. Order placed.

## 2019-11-20 NOTE — Telephone Encounter (Signed)
    Please have him take 60 mEq for the next 4 days then resume K-dur at 30 mEq daily. DO NOT take more Metolazone until he has repeat labs and K+ has improved. Repeat BMET early next week (If the lab is closed on Monday, would repeat Tuesday).   Signed, Erma Heritage, PA-C 11/20/2019, 11:29 AM Pager: [phone]

## 2019-11-25 ENCOUNTER — Other Ambulatory Visit (HOSPITAL_COMMUNITY)
Admission: RE | Admit: 2019-11-25 | Discharge: 2019-11-25 | Disposition: A | Discharge status: Home / Self Care | Payer: Self-pay | Source: Ambulatory Visit | Attending: Student | Admitting: Student

## 2019-11-25 ENCOUNTER — Telehealth: Payer: Self-pay | Admitting: *Deleted

## 2019-11-25 ENCOUNTER — Other Ambulatory Visit: Payer: Self-pay

## 2019-11-25 DIAGNOSIS — Z79899 Other long term (current) drug therapy: Secondary | ICD-10-CM

## 2019-11-25 LAB — BASIC METABOLIC PANEL
Anion gap: 16 — ABNORMAL HIGH (ref 5–15)
BUN: 21 mg/dL — ABNORMAL HIGH (ref 6–20)
CO2: 30 mmol/L (ref 22–32)
Calcium: 9.1 mg/dL (ref 8.9–10.3)
Chloride: 88 mmol/L — ABNORMAL LOW (ref 98–111)
Creatinine, Ser: 1.04 mg/dL (ref 0.61–1.24)
GFR calc Af Amer: >60 mL/min (ref >60)
GFR calc non Af Amer: >60 mL/min (ref >60)
Glucose, Bld: 353 mg/dL — ABNORMAL HIGH (ref 70–99)
Potassium: 3.2 mmol/L — ABNORMAL LOW (ref 3.5–5.1)
Sodium: 134 mmol/L — ABNORMAL LOW (ref 135–145)

## 2019-11-25 MED ORDER — POTASSIUM CHLORIDE CRYS ER 20 MEQ PO TBCR
40.0000 meq | EXTENDED_RELEASE_TABLET | Freq: Every day | ORAL | 3 refills | Status: DC
Start: 2019-11-25 — End: 2019-12-09

## 2019-11-25 NOTE — Telephone Encounter (Signed)
Orders placed.

## 2019-11-25 NOTE — Telephone Encounter (Signed)
-----   Message from Erma Heritage, [place] sent at 11/25/2019 10:20 AM EDT ----- Please let the patient know his K+ is normalizing but still remains low. I would recommend increasing K-dur from 30 mEq daily to 40 mEq daily. Can take Metolazone later this week if needed as it looks like weight had trended upwards over the weekend but was starting to improve. On the days he takes Metolazone, would take 60 mEq. Please check with him to see if he wants K-dur as 20 mEq tablets to make this easier. Renal function stable. Glucose was elevated and needs to be followed by his PCP.   Repeat BMET again in 2 weeks.

## 2019-11-26 ENCOUNTER — Ambulatory Visit (INDEPENDENT_AMBULATORY_CARE_PROVIDER_SITE_OTHER): Payer: Self-pay | Admitting: *Deleted

## 2019-11-26 ENCOUNTER — Other Ambulatory Visit: Payer: Self-pay | Admitting: Physician Assistant

## 2019-11-26 DIAGNOSIS — I442 Atrioventricular block, complete: Secondary | ICD-10-CM

## 2019-11-26 LAB — CUP PACEART REMOTE DEVICE CHECK
Battery Remaining Longevity: 128 mo
Battery Remaining Percentage: 95.5 %
Battery Voltage: 3.02 V
Brady Statistic AP VP Percent: 1 %
Brady Statistic AP VS Percent: 3.7 %
Brady Statistic AS VP Percent: 1.6 %
Brady Statistic AS VS Percent: 95 %
Brady Statistic RA Percent Paced: 3.6 %
Brady Statistic RV Percent Paced: 1.7 %
Date Time Interrogation Session: 20210707020019
Implantable Lead Implant Date: 20190409
Implantable Lead Implant Date: 20190409
Implantable Lead Location: 753859
Implantable Lead Location: 753860
Implantable Pulse Generator Implant Date: 20190409
Lead Channel Impedance Value: 590 Ohm
Lead Channel Impedance Value: 600 Ohm
Lead Channel Pacing Threshold Amplitude: 0.75 V
Lead Channel Pacing Threshold Amplitude: 0.75 V
Lead Channel Pacing Threshold Pulse Width: 0.5 ms
Lead Channel Pacing Threshold Pulse Width: 0.5 ms
Lead Channel Sensing Intrinsic Amplitude: 5 mV
Lead Channel Sensing Intrinsic Amplitude: 6.1 mV
Lead Channel Setting Pacing Amplitude: 2 V
Lead Channel Setting Pacing Amplitude: 2.5 V
Lead Channel Setting Pacing Pulse Width: 0.5 ms
Lead Channel Setting Sensing Sensitivity: 2 mV
Pulse Gen Model: 2272
Pulse Gen Serial Number: 9010865

## 2019-11-26 MED ORDER — METFORMIN HCL 850 MG PO TABS: 850.0000 mg | ORAL_TABLET | Freq: Two times a day (BID) | ORAL | 1 refills | Status: DC

## 2019-11-28 NOTE — Progress Notes (Signed)
Remote pacemaker transmission.   

## 2019-12-03 ENCOUNTER — Other Ambulatory Visit: Payer: Self-pay | Admitting: Physician Assistant

## 2019-12-09 ENCOUNTER — Other Ambulatory Visit (HOSPITAL_COMMUNITY)
Admission: RE | Admit: 2019-12-09 | Discharge: 2019-12-09 | Disposition: A | Discharge status: Home / Self Care | Payer: Self-pay | Source: Ambulatory Visit | Attending: Student | Admitting: Student

## 2019-12-09 ENCOUNTER — Telehealth: Payer: Self-pay | Admitting: *Deleted

## 2019-12-09 DIAGNOSIS — Z79899 Other long term (current) drug therapy: Secondary | ICD-10-CM | POA: Insufficient documentation

## 2019-12-09 LAB — BASIC METABOLIC PANEL
Anion gap: 13 (ref 5–15)
BUN: 19 mg/dL (ref 6–20)
CO2: 27 mmol/L (ref 22–32)
Calcium: 8.5 mg/dL — ABNORMAL LOW (ref 8.9–10.3)
Chloride: 95 mmol/L — ABNORMAL LOW (ref 98–111)
Creatinine, Ser: 1.09 mg/dL (ref 0.61–1.24)
GFR calc Af Amer: >60 mL/min (ref >60)
GFR calc non Af Amer: >60 mL/min (ref >60)
Glucose, Bld: 162 mg/dL — ABNORMAL HIGH (ref 70–99)
Potassium: 3.3 mmol/L — ABNORMAL LOW (ref 3.5–5.1)
Sodium: 135 mmol/L (ref 135–145)

## 2019-12-09 MED ORDER — POTASSIUM CHLORIDE CRYS ER 20 MEQ PO TBCR
40.0000 meq | EXTENDED_RELEASE_TABLET | Freq: Every day | ORAL | 3 refills | Status: DC
Start: 2019-12-09 — End: 2020-01-02

## 2019-12-09 NOTE — Telephone Encounter (Signed)
-----   Message from Erma Heritage, [place] sent at 12/09/2019 12:02 PM EDT ----- Please thank the patient for keeping his weight log as I reviewed this and his weight has overall been stable since last month. His weight had previously peaked at 502 lbs in 10/2019 and has now been mostly stable in the 470's. In reviewing his lab work, kidney function overall remains stable and his blood sugar has also significantly improved. Potassium has improved as well but still remains low. Would continue taking K-dur 2mEq daily but increase the amount from 60 mEq to 80 mEq on the days he takes Metolazone.

## 2019-12-09 NOTE — Telephone Encounter (Signed)
Pt notified and order placed 

## 2019-12-17 ENCOUNTER — Other Ambulatory Visit: Payer: Self-pay

## 2019-12-17 ENCOUNTER — Encounter: Payer: Self-pay | Admitting: Student

## 2019-12-17 ENCOUNTER — Ambulatory Visit (INDEPENDENT_AMBULATORY_CARE_PROVIDER_SITE_OTHER): Payer: Self-pay | Admitting: Student

## 2019-12-17 VITALS — BP 130/82 | HR 65 | Ht 71.0 in | Wt >= 6400 oz

## 2019-12-17 DIAGNOSIS — G4733 Obstructive sleep apnea (adult) (pediatric): Secondary | ICD-10-CM

## 2019-12-17 DIAGNOSIS — I442 Atrioventricular block, complete: Secondary | ICD-10-CM

## 2019-12-17 DIAGNOSIS — I480 Paroxysmal atrial fibrillation: Secondary | ICD-10-CM

## 2019-12-17 DIAGNOSIS — I5032 Chronic diastolic (congestive) heart failure: Secondary | ICD-10-CM

## 2019-12-17 DIAGNOSIS — Z79899 Other long term (current) drug therapy: Secondary | ICD-10-CM

## 2019-12-17 NOTE — Progress Notes (Signed)
Cardiology Office Note    Date:  12/17/2019   ID:  Reginald [name], DOB 03/25/1971, MRN [id number]  PCP:  [name], PA-C  Cardiologist: Kate Sable, MD (Inactive)   EP: Dr. Rayann Heman  Chief Complaint  Patient presents with  . Follow-up    3 month visit    History of Present Illness:    Reginald [name] is a 49 y.o. male with past medical history of severeAS (s/p bovine tissue AVR in 08/2017 with repair of ascending thoracic aortic aneurysm),normalcoronary arteriesby cardiac catheterization in 2019, paroxysmal atrial fibrillation, CHB(s/p PPM placement in 08/2017), history of bilateral PE, HTN, morbid obesity and OSA who presents to the office today for 67-month follow-up.  He was last examined by myself in 08/2019 and his weight had been variable on his home scales from 470 lbs to 490 lbs. He had previously been unable to afford Torsemide and was continued on Lasix 80 mg twice daily along with Metolazone 5 mg once weekly.  He did call the office in 10/2019 and weight had increased to 502 lbs. He was instructed to take extra Metolazone for several days and follow-up with his weights. He did develop hypokalemia and K-dur was titrated. Most recent BMET on 7/20 showed creatinine at 1.09 and K+ at 3.3 (previously as low as 2.6 in 10/2019).   In talking with the patient today, he reports that his breathing has been more labored over the past few months in the setting of warmer temperatures. He describes discomfort along his sternal region when his weight increases but then this improves as his fluid status returns to baseline. He denies any specific orthopnea or PND but does utilize his CPAP on a nightly basis. He was experiencing significant abdominal cramping in the setting of hypokalemia last month but says this has improved.   Past Medical History:  Diagnosis Date  . Anxiety   . Aortic stenosis   . Arthritis   . Asthma   . Back pain   . Cellulitis of skin with lymphangitis    . CHF (congestive heart failure) ([place])   . Chronic diastolic congestive heart failure ([place])   . Chronic venous insufficiency   . Constipation   . COPD (chronic obstructive pulmonary disease) ([place])   . Depression   . Dyspnea   . Essential hypertension   . Gout   . Heart murmur   . Hypertension   . Morbid obesity ([place])   . Obesity   . Pneumonia    walking pneumonia  . Pre-diabetes   . Prostatitis   . Pulmonary embolism ([place])   . Pulmonary hypertension ([place])   . Pyelonephritis   . S/P aortic valve replacement with bioprosthetic valve 08/23/2017   25 mm Edwards Inspiris Resilia stented bovine pericardial tissue valve  . 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 Sleep apnea    cpap   . Thoracic ascending aortic aneurysm ([place])   . Thoracic ascending aortic aneurysm ([place])   . Tobacco abuse     Past Surgical History:  Procedure Laterality Date  . AORTIC VALVE REPLACEMENT N/A 08/23/2017   Procedure: AORTIC VALVE REPLACEMENT (AVR) USING INSPIRIS RESILIA AORTIC VALVE SIZE 25 MM;  Surgeon: Rexene Alberts, MD;  Location: [facility];  Service: Open Heart Surgery;  Laterality: N/A;  . MULTIPLE EXTRACTIONS WITH ALVEOLOPLASTY N/A 07/23/2017   Procedure: Extraction of tooth #10 with alveoloplasty and gross debridement of remaining teeth;  Surgeon: Enrique Sack,  Pete Glatter, DDS;  Location: [facility];  Service: Oral Surgery;  Laterality: N/A;  . NO PAST SURGERIES    . PACEMAKER IMPLANT N/A 08/28/2017    St Jude Medical Assurity MRI conditional  dual-chamber pacemaker for symptomatic complete heart blockby Dr Rayann Heman  . TEE WITHOUT CARDIOVERSION N/A 07/16/2017   Procedure: TRANSESOPHAGEAL ECHOCARDIOGRAM (TEE);  Surgeon: Lelon Perla, MD;  Location[facility] ENDOSCOPY;  Service: Cardiovascular;  Laterality: N/A;  . TEE WITHOUT CARDIOVERSION N/A 08/23/2017   Procedure: TRANSESOPHAGEAL ECHOCARDIOGRAM (TEE);  Surgeon: Rexene Alberts, MD;  Location: [facility];  Service:  Open Heart Surgery;  Laterality: N/A;  . THORACIC AORTIC ANEURYSM REPAIR N/A 08/23/2017   Procedure: THORACIC ASCENDING ANEURYSM REPAIR (AAA) USING HEMASHIELD GOLD KNITTED MICROVEL DOUBLE VELOUR VASCULAR GRAFT D: 24 MM  L: 30 CM;  Surgeon: Rexene Alberts, MD;  Location: [facility];  Service: Open Heart Surgery;  Laterality: N/A;    Current Medications: Outpatient Medications Prior to Visit  Medication Sig Dispense Refill  . acetaminophen (TYLENOL) 325 MG tablet Take 650 mg by mouth every 6 (six) hours as needed for mild pain.    Marland Kitchen albuterol (PROVENTIL) (2.5 MG/3ML) 0.083% nebulizer solution INHALE 1 VIAL VIA NEBULIZER EVERY 6 HOURS AS NEEDED FOR WHEEZING OR SHORTNESS OF BREATH 270 mL 1  . allopurinol (ZYLOPRIM) 300 MG tablet TAKE 1 Tablet BY MOUTH ONCE EVERY DAY 90 tablet 1  . busPIRone (BUSPAR) 10 MG tablet Take 10 mg by mouth 3 (three) times daily.     . citalopram (CELEXA) 40 MG tablet Take 40 mg by mouth daily.     Marland Kitchen docusate sodium (COLACE) 100 MG capsule Take 200 mg by mouth daily as needed for mild constipation.     . furosemide (LASIX) 80 MG tablet Take 1 tablet (80 mg total) by mouth 2 (two) times daily. 180 tablet 3  . gabapentin (NEURONTIN) 100 MG capsule Take 1 capsule (100 mg total) by mouth 2 (two) times daily as needed. 60 capsule 3  . ibuprofen (ADVIL) 200 MG tablet Take 200 mg by mouth 2 (two) times daily as needed.    Javier Docker Oil 350 MG CAPS Take 1 capsule by mouth in the morning and at bedtime.     Marland Kitchen loratadine (CLARITIN) 10 MG tablet Take 10 mg by mouth daily as needed for allergies.    . metFORMIN (GLUCOPHAGE) 850 MG tablet Take 1 tablet (850 mg total) by mouth 2 (two) times daily with a meal. 60 tablet 1  . metolazone (ZAROXOLYN) 5 MG tablet Take 5 mg by mouth once a week.    . metoprolol tartrate (LOPRESSOR) 100 MG tablet Take 1 tablet (100 mg total) by mouth 2 (two) times daily. 180 tablet 0  . potassium chloride SA (KLOR-CON) 20 MEQ tablet Take 2 tablets (40 mEq total) by  mouth daily. Take 80 meq on the days that you take Metolazone 200 tablet 3  . PROVENTIL HFA 108 (90 Base) MCG/ACT inhaler INHALE 2 PUFFS BY MOUTH EVERY 6 HOURS AS NEEDED FOR COUGHING, WHEEZING, OR SHORTNESS OF BREATH 6.7 g 1  . rivaroxaban (XARELTO) 20 MG TABS tablet TAKE 1 Tablet BY MOUTH ONCE EVERY DAY WITH SUPPER 90 tablet 0  . WIXELA INHUB 250-50 MCG/DOSE AEPB INHALE 1 PUFF BY MOUTH 2 TIMES DAILY. RINSE MOUTH AFTER USE. 60 each 1  . metolazone (ZAROXOLYN) 5 MG tablet Take 1 tablet (5 mg total) by mouth 2 (two) times a week. (Patient not taking: Reported on 12/17/2019) 30  tablet 1  . potassium chloride (MICRO-K) 10 MEQ CR capsule TAKE 3 Capsules BY MOUTH ONCE DAILY EVERY MONDAY, WEDNESDAY AND FRIDAY AND TAKE 1 Capsule BY MOUTH ONCE DAILY ON ALL OTHER DAYS (Patient not taking: Reported on 12/17/2019) 55 capsule 3   No facility-administered medications prior to visit.     Allergies:   Patient has no known allergies.   Social History   Socioeconomic History  . Marital status: Divorced    Spouse name: Not on file  . Number of children: Not on file  . Years of education: Not on file  . Highest education level: Not on file  Occupational History  . Not on file  Tobacco Use  . Smoking status: Former Smoker    Packs/day: 2.00    Years: 16.00    Pack years: 32.00    Types: Cigarettes    Quit date: 08/23/2017    Years since quitting: 2.3  . Smokeless tobacco: Never Used  Vaping Use  . Vaping Use: Never used  Substance and Sexual Activity  . Alcohol use: No    Comment: have had alcohol in the past, not heavy  . Drug use: No  . Sexual activity: Not on file  Other Topics Concern  . Not on file  Social History Narrative  . Not on file   Social Determinants of Health   Financial Resource Strain:   . Difficulty of Paying Living Expenses:   Food Insecurity:   . Worried About Charity fundraiser in the Last Year:   . Arboriculturist in the Last Year:   Transportation Needs:   . Lexicographer (Medical):   Marland Kitchen Lack of Transportation (Non-Medical):   Physical Activity:   . Days of Exercise per Week:   . Minutes of Exercise per Session:   Stress:   . Feeling of Stress :   Social Connections:   . Frequency of Communication with Friends and Family:   . Frequency of Social Gatherings with Friends and Family:   . Attends Religious Services:   . Active Member of Clubs or Organizations:   . Attends Archivist Meetings:   Marland Kitchen Marital Status:      Family History:  [name] family history includes Alzheimer's disease in his mother; Diabetes in his brother; Heart attack in his brother; Hypertension in his mother.   Review of Systems:   Please see the history of present illness.     General:  No chills, fever, night sweats or weight changes.  Cardiovascular:  No orthopnea, palpitations, paroxysmal nocturnal dyspnea. Positive for dyspnea on exertion and edema.  Dermatological: No rash, lesions/masses Respiratory: No cough, dyspnea Urologic: No hematuria, dysuria Abdominal:   No nausea, vomiting, diarrhea, bright red blood per rectum, melena, or hematemesis Neurologic:  No visual changes, wkns, changes in mental status. All other systems reviewed and are otherwise negative except as noted above.   Physical Exam:    VS:  BP (!) 130/82 (BP Location: Right Wrist, Patient Position: Sitting, Cuff Size: Normal)   Pulse 65   Ht 5\' 11"  (1.803 m)   Wt (!) 488 lb 3.2 oz (221.4 kg)   SpO2 96%   BMI 68.09 kg/m    General: Pleasant, morbidly obese male appearing in no acute distress. Head: Normocephalic, atraumatic, sclera non-icteric.  Neck: No carotid bruits. JVD not elevated.  Lungs: Respirations regular and unlabored, without wheezes or rales.  Heart: Regular rate and rhythm. No S3 or S4.  No murmur, no rubs, or gallops appreciated. Abdomen: Soft, non-tender, non-distended. No obvious abdominal masses. Msk:  Strength and tone appear normal for age. No  obvious joint deformities or effusions. Extremities: No clubbing or cyanosis. Chronic appearing edema, improved from prior examinations.  Distal pedal pulses are 2+ bilaterally. Neuro: Alert and oriented X 3. Moves all extremities spontaneously. No focal deficits noted. Psych:  Responds to questions appropriately with a normal affect. Skin: No rashes or lesions noted  Wt Readings from Last 3 Encounters:  12/17/19 (!) 488 lb 3.2 oz (221.4 kg)  10/28/19 (!) 486 lb (220.4 kg)  09/17/19 (!) 482 lb (218.6 kg)     Studies/Labs Reviewed:   EKG:  EKG is not ordered today.  Recent Labs: 03/08/2019: TSH 3.834 03/15/2019: B Natriuretic Peptide 40.0 03/17/2019: Magnesium 2.3 03/24/2019: Hemoglobin 13.1; Platelets 331 09/17/2019: ALT 49 12/09/2019: BUN 19; Creatinine, Ser 1.09; Potassium 3.3; Sodium 135   Lipid Panel    Component Value Date/Time   CHOL 179 06/18/2019 0915   TRIG 320 (H) 06/18/2019 0915   HDL 28 (L) 06/18/2019 0915   CHOLHDL 6.4 06/18/2019 0915   VLDL 64 (H) 06/18/2019 0915   LDLCALC 87 06/18/2019 0915    Additional studies/ records that were reviewed today include:   Cardiac Catheterization: 06/2017 1. No angiographic evidence of CAD 2. Severe aortic stenosis by TEE/TTE.  Due to turbulent flow in the ascending aorta and movement of the catheters, I could not cross the valve. The patient had a TEE this am that demonstrated severe AS. I did not feel that further attempts at crossing the valve would provide any clinical data that would change his treatment plan, though more attempts increased the risk of procedure complication. 3. Normal right heart pressures  Recommendations: Will continue planning for AVR vs TAVR. Pt to f/u with Dr. Roxy Manns after CT scans.  Echocardiogram: 02/2019 IMPRESSIONS    1. Left ventricular ejection fraction, by visual estimation, is 60 to  65%. The left ventricle has normal function. Normal left ventricular size.  There is no left ventricular  hypertrophy.  2. Global right ventricle has mildly reduced systolic function.The right  ventricular size is not well visualized. No increase in right ventricular  wall thickness.  3. Left atrial size was not well visualized.  4. Right atrial size was not well visualized.  5. The mitral valve was not well visualized. No evidence of mitral valve  regurgitation. No evidence of mitral stenosis.  6. The tricuspid valve is not well visualized. Tricuspid valve  regurgitation is trivial.  7. Poorly visualized Bioprosthetic aortic valve valve is present in the  aortic position.  8. Pulmonic valve poorly visualized.  9. S/P Ascending thoraici aortic aneurysm repair. Aortic root and  ascending aorta poorly visualized.  10. Mildly elevated pulmonary artery systolic pressure.   Assessment:    1. Chronic diastolic congestive heart failure ([place])   2. Medication management   3. PAF (paroxysmal atrial fibrillation) ([place])   4. CHB (complete heart block) ([facility])   5. OSA (obstructive sleep apnea)      Plan:   In order of problems listed above:  1. Chronic Diastolic CHF - He does experience significant shifts in his weight as this can vary by 5+ pounds from day to day.  He reports that his weight was stable at 478 lbs on his home scales earlier today (at 488 lbs on the office scales). Given that his numbers have overall remained stable, will continue his current diuretic regimen  with Lasix 80 mg twice daily and Metolazone 5 mg once weekly. He is aware to take an extra Metolazone tablet if weight increases above 490 lbs. Continue K+ supplementation as previously discussed given his hypokalemia. Repeat BMET in 3-4 weeks.  - He is trying to be mindful of his sodium and fluid intake but does consume a significant amount of fluids due to feeling thirsty and due to the warmer temperatures.  2. Paroxysmal Atrial Fibrillation - He denies any recent palpitations and is in normal sinus rhythm during  examination today. Device interrogation in 08/2019 showed an atrial fibrillation burden of 1.4%. Most recent interrogation earlier this month showed only one episode of atrial tachycardia lasting 6 seconds. Continue Lopressor 100 mg twice daily for rate control. - He denies any evidence of active bleeding. Remains on Xarelto for anticoagulation.  3. Complete Heart Block - s/p PPM placement in 08/2017 which is followed by Dr. Rayann Heman. Most recent device interrogation earlier this month showed normal device function.    4. OSA - He is compliant with his CPAP and continued compliance was encouraged.    Medication Adjustments/Labs and Tests Ordered: Current medicines are reviewed at length with the patient today.  Concerns regarding medicines are outlined above.  Medication changes, Labs and Tests ordered today are listed in the Patient Instructions below. Patient Instructions  Medication Instructions:  Your physician recommends that you continue on your current medications as directed. Please refer to the Current Medication list given to you today.  *If you need a refill on your cardiac medications before your next appointment, please call your pharmacy*   Lab Work: Your physician recommends that you return for lab work in: 1 Month   If you have labs (blood work) drawn today and your tests are completely normal, you will receive your results only by: Marland Kitchen MyChart Message (if you have MyChart) OR . A paper copy in the mail If you have any lab test that is abnormal or we need to change your treatment, we will call you to review the results.   Testing/Procedures: NONE   Follow-Up: At [facility], you and your health needs are our priority.  As part of our continuing mission to provide you with exceptional heart care, we have created designated Provider Care Teams.  These Care Teams include your primary Cardiologist (physician) and Advanced Practice Providers (APPs -  Physician Assistants and  Nurse Practitioners) who all work together to provide you with the care you need, when you need it.  We recommend signing up for the patient portal called "MyChart".  Sign up information is provided on this After Visit Summary.  MyChart is used to connect with patients for Virtual Visits (Telemedicine).  Patients are able to view lab/test results, encounter notes, upcoming appointments, etc.  Non-urgent messages can be sent to your provider as well.   To learn more about what you can do with MyChart, go to [email or web address].    Your next appointment:   4 month(s)  The format for your next appointment:   In Person  Provider:   Carlyle Dolly, MD or Bernerd Pho, PA-C   Other Instructions Thank you for choosing [facility]!    Signed, Erma Heritage, PA-C  12/17/2019 4:58 PM    [facility] [address] [place], [place] [address] Phone: [phone] Fax: [phone]

## 2019-12-17 NOTE — Patient Instructions (Addendum)
Medication Instructions:  Your physician recommends that you continue on your current medications as directed. Please refer to the Current Medication list given to you today.  *If you need a refill on your cardiac medications before your next appointment, please call your pharmacy*   Lab Work: Your physician recommends that you return for lab work in: 1 Month   If you have labs (blood work) drawn today and your tests are completely normal, you will receive your results only by: Marland Kitchen MyChart Message (if you have MyChart) OR . A paper copy in the mail If you have any lab test that is abnormal or we need to change your treatment, we will call you to review the results.   Testing/Procedures: NONE   Follow-Up: At [facility], you and your health needs are our priority.  As part of our continuing mission to provide you with exceptional heart care, we have created designated Provider Care Teams.  These Care Teams include your primary Cardiologist (physician) and Advanced Practice Providers (APPs -  Physician Assistants and Nurse Practitioners) who all work together to provide you with the care you need, when you need it.  We recommend signing up for the patient portal called "MyChart".  Sign up information is provided on this After Visit Summary.  MyChart is used to connect with patients for Virtual Visits (Telemedicine).  Patients are able to view lab/test results, encounter notes, upcoming appointments, etc.  Non-urgent messages can be sent to your provider as well.   To learn more about what you can do with MyChart, go to [email or web address].    Your next appointment:   4 month(s)  The format for your next appointment:   In Person  Provider:   Carlyle Dolly, MD or Bernerd Pho, PA-C   Other Instructions Thank you for choosing [facility]!    Potassium Content of Foods  Potassium is a mineral found in many foods and drinks. It affects how the heart works, and  helps keep fluids and minerals balanced in the body. The amount of potassium you need each day depends on your age and any medical conditions you may have. Talk to your health care provider or dietitian about how much potassium you need. The following lists of foods provide the general serving size for foods and the approximate amount of potassium in each serving, listed in milligrams (mg). Actual values may vary depending on the product and how it is processed. High in potassium The following foods and beverages have 200 mg or more of potassium per serving:  Apricots (raw) - 2 have 200 mg of potassium.  Apricots (dry) - 5 have 200 mg of potassium.  Artichoke - 1 medium has 345 mg of potassium.  Avocado -  fruit has 245 mg of potassium.  Banana - 1 medium fruit has 425 mg of potassium.  Olmito and Olmito or baked beans (canned) -  cup has 280 mg of potassium.  White beans (canned) -  cup has 595 mg potassium.  Beef roast - 3 oz has 320 mg of potassium.  Ground beef - 3 oz has 270 mg of potassium.  Beets (raw or cooked) -  cup has 260 mg of potassium.  Bran muffin - 2 oz has 300 mg of potassium.  Broccoli (cooked) -  cup has 230 mg of potassium.  Brussels sprouts -  cup has 250 mg of potassium.  Cantaloupe -  cup has 215 mg of potassium.  Cereal, 100% bran -  cup has 200-400  mg of potassium.  Cheeseburger -1 single fast food burger has 225-400 mg of potassium.  Chicken - 3 oz has 220 mg of potassium.  Clams (canned) - 3 oz has 535 mg of potassium.  Crab - 3 oz has 225 mg of potassium.  Dates - 5 have 270 mg of potassium.  Dried beans and peas -  cup has 300-475 mg of potassium.  Figs (dried) - 2 have 260 mg of potassium.  Fish (halibut, tuna, cod, snapper) - 3 oz has 480 mg of potassium.  Fish (salmon, haddock, swordfish, perch) - 3 oz has 300 mg of potassium.  Fish (tuna, canned) - 3 oz has 200 mg of potassium.  [place] fries (fast food) - 3 oz has 470 mg of  potassium.  Granola with fruit and nuts -  cup has 200 mg of potassium.  Grapefruit juice -  cup has 200 mg of potassium.  Honeydew melon -  cup has 200 mg of potassium.  Kale (raw) - 1 cup has 300 mg of potassium.  Kiwi - 1 medium fruit has 240 mg of potassium.  Kohlrabi, rutabaga, parsnips -  cup has 280 mg of potassium.  Lentils -  cup has 365 mg of potassium.  Mango - 1 each has 325 mg of potassium.  Milk (nonfat, low-fat, whole, buttermilk) - 1 cup has 350-380 mg of potassium.  Milk (chocolate) - 1 cup has 420 mg of potassium  Molasses - 1 Tbsp has 295 mg of potassium.  Mushrooms -  cup has 280 mg of potassium.  Nectarine - 1 each has 275 mg of potassium.  Nuts (almonds, peanuts, hazelnuts, [place], cashew, mixed) - 1 oz has 200 mg of potassium.  Nuts (pistachios) - 1 oz has 295 mg of potassium.  Orange - 1 fruit has 240 mg of potassium.  Orange juice -  cup has 235 mg of potassium.  Papaya -  medium fruit has 390 mg of potassium.  Peanut butter (chunky) - 2 Tbsp has 240 mg of potassium.  Peanut butter (smooth) - 2 Tbsp has 210 mg of potassium.  Pear - 1 medium (200 mg) of potassium.  Pomegranate - 1 whole fruit has 400 mg of potassium.  Pomegranate juice -  cup has 215 mg of potassium.  Pork - 3 oz has 350 mg of potassium.  Potato chips (salted) - 1 oz has 465 mg of potassium.  Potato (baked with skin) - 1 medium has 925 mg of potassium.  Potato (boiled) -  cup has 255 mg of potassium.  Potato (Mashed) -  cup has 330 mg of potassium.  Prune juice -  cup has 370 mg of potassium.  Prunes - 5 have 305 mg of potassium.  Pudding (chocolate) -  cup has 230 mg of potassium.  Pumpkin (canned) -  cup has 250 mg of potassium.  Raisins (seedless) -  cup has 270 mg of potassium.  Seeds (sunflower or pumpkin) - 1 oz has 240 mg of potassium.  Soy milk - 1 cup has 300 mg of potassium.  Spinach (cooked) - 1/2 cup has 420 mg of  potassium.  Spinach (canned) -  cup has 370 mg of potassium.  Sweet potato (baked with skin) - 1 medium has 450 mg of potassium.  Swiss chard -  cup has 480 mg of potassium.  Tomato or vegetable juice -  cup has 275 mg of potassium.  Tomato (sauce or puree) -  cup has 400-550 mg of potassium.  Tomato (raw) - 1 medium has 290 mg of potassium.  Tomato (canned) -  cup has 200-300 mg of potassium.  [place] - 3 oz has 250 mg of potassium.  Wheat germ - 1 oz has 250 mg of potassium.  Winter squash -  cup has 250 mg of potassium.  Yogurt (plain or fruited) - 6 oz has 260-435 mg of potassium.  Zucchini -  cup has 220 mg of potassium. Moderate in potassium The following foods and beverages have 50-200 mg of potassium per serving:  Apple - 1 fruit has 150 mg of potassium  Apple juice -  cup has 150 mg of potassium  Applesauce -  cup has 90 mg of potassium  Apricot nectar -  cup has 140 mg of potassium  Asparagus (small spears) -  cup has 155 mg of potassium  Asparagus (large spears) - 6 have 155 mg of potassium  Bagel (cinnamon raisin) - 1 four-inch bagel has 130 mg of potassium  Bagel (egg or plain) - 1 four- inch bagel has 70 mg of potassium  Beans (green) -  cup has 90 mg of potassium  Beans (yellow) -  cup has 190 mg of potassium  Beer, regular - 12 oz has 100 mg of potassium  Beets (canned) -  cup has 125 mg of potassium  Blackberries -  cup has 115 mg of potassium  Blueberries -  cup has 60 mg of potassium  Bread (whole wheat) - 1 slice has 70 mg of potassium  Broccoli (raw) -  cup has 145 mg of potassium  Cabbage -  cup has 150 mg of potassium  Carrots (cooked or raw) -  cup has 180 mg of potassium  Cauliflower (raw) -  cup has 150 mg of potassium  Celery (raw) -  cup has 155 mg of potassium  Cereal, bran flakes -  cup has 120-150 mg of potassium  Cheese (cottage) -  cup has 110 mg of potassium  Cherries - 10 have 150 mg of  potassium  Chocolate - 1 oz bar has 165 mg of potassium  Coffee (brewed) - 6 oz has 90 mg of potassium  Corn -  cup or 1 ear has 195 mg of potassium  Cucumbers -  cup has 80 mg of potassium  Egg - 1 large egg has 60 mg of potassium  Eggplant -  cup has 60 mg of potassium  Endive (raw) -  cup has 80 mg of potassium  English muffin - 1 has 65 mg of potassium  Fish (ocean perch) - 3 oz has 192 mg of potassium  Frankfurter, beef or pork - 1 has 75 mg of potassium  Fruit cocktail -  cup has 115 mg of potassium  Grape juice -  cup has 170 mg of potassium  Grapefruit -  fruit has 175 mg of potassium  Grapes -  cup has 155 mg of potassium  Greens: kale, turnip, collard -  cup has 110-150 mg of potassium  Ice cream or frozen yogurt (chocolate) -  cup has 175 mg of potassium  Ice cream or frozen yogurt (vanilla) -  cup has 120-150 mg of potassium  Lemons, limes - 1 each has 80 mg of potassium  Lettuce - 1 cup has 100 mg of potassium  Mixed vegetables -  cup has 150 mg of potassium  Mushrooms, raw -  cup has 110 mg of potassium  Nuts (walnuts, pecans, or macadamia) - 1 oz has 125 mg  of potassium  Oatmeal -  cup has 80 mg of potassium  Okra -  cup has 110 mg of potassium  Onions -  cup has 120 mg of potassium  Peach - 1 has 185 mg of potassium  Peaches (canned) -  cup has 120 mg of potassium  Pears (canned) -  cup has 120 mg of potassium  Peas, green (frozen) -  cup has 90 mg of potassium  Peppers (Green) -  cup has 130 mg of potassium  Peppers (Red) -  cup has 160 mg of potassium  Pineapple juice -  cup has 165 mg of potassium  Pineapple (fresh or canned) -  cup has 100 mg of potassium  Plums - 1 has 105 mg of potassium  Pudding, vanilla -  cup has 150 mg of potassium  Raspberries -  cup has 90 mg of potassium  Rhubarb -  cup has 115 mg of potassium  Rice, wild -  cup has 80 mg of potassium  Shrimp - 3 oz has 155 mg of  potassium  Spinach (raw) - 1 cup has 170 mg of potassium  Strawberries -  cup has 125 mg of potassium  Summer squash -  cup has 175-200 mg of potassium  Swiss chard (raw) - 1 cup has 135 mg of potassium  Tangerines - 1 fruit has 140 mg of potassium  Tea, brewed - 6 oz has 65 mg of potassium  Turnips -  cup has 140 mg of potassium  Watermelon -  cup has 85 mg of potassium  Wine (Red, table) - 5 oz has 180 mg of potassium  Wine (White, table) - 5 oz 100 mg of potassium Low in potassium The following foods and beverages have less than 50 mg of potassium per serving.  Bread (white) - 1 slice has 30 mg of potassium  Carbonated beverages - 12 oz has less than 5 mg of potassium  Cheese - 1 oz has 20-30 mg of potassium  Cranberries -  cup has 45 mg of potassium  Cranberry juice cocktail -  cup has 20 mg of potassium  Fats and oils - 1 Tbsp has less than 5 mg of potassium  Hummus - 1 Tbsp has 32 mg of potassium  Nectar (papaya, mango, or pear) -  cup has 35 mg of potassium  Rice (white or brown) -  cup has 50 mg of potassium  Spaghetti or macaroni (cooked) -  cup has 30 mg of potassium  Tortilla, flour or corn - 1 has 50 mg of potassium  Waffle - 1 four-inch waffle has 50 mg of potassium  Water chestnuts -  cup has 40 mg of potassium Summary  Potassium is a mineral found in many foods and drinks. It affects how the heart works, and helps keep fluids and minerals balanced in the body.  The amount of potassium you need each day depends on your age and any existing medical conditions you may have. Your health care provider or dietitian may recommend an amount of potassium that you should have each day. This information is not intended to replace advice given to you by your health care provider. Make sure you discuss any questions you have with your health care provider. Document Revised: 04/20/2017 Document Reviewed: 08/02/2016 Elsevier Patient Education  [place].

## 2019-12-29 ENCOUNTER — Other Ambulatory Visit: Payer: Self-pay | Admitting: Physician Assistant

## 2019-12-29 DIAGNOSIS — I10 Essential (primary) hypertension: Secondary | ICD-10-CM

## 2019-12-29 DIAGNOSIS — E119 Type 2 diabetes mellitus without complications: Secondary | ICD-10-CM

## 2019-12-29 DIAGNOSIS — R7989 Other specified abnormal findings of blood chemistry: Secondary | ICD-10-CM

## 2020-01-01 ENCOUNTER — Other Ambulatory Visit (HOSPITAL_COMMUNITY)
Admission: RE | Admit: 2020-01-01 | Discharge: 2020-01-01 | Disposition: A | Discharge status: Home / Self Care | Payer: Medicare Other | Source: Ambulatory Visit | Attending: Physician Assistant | Admitting: Physician Assistant

## 2020-01-01 ENCOUNTER — Other Ambulatory Visit: Payer: Self-pay | Admitting: Student

## 2020-01-01 ENCOUNTER — Telehealth: Payer: Self-pay | Admitting: Student

## 2020-01-01 ENCOUNTER — Other Ambulatory Visit: Payer: Self-pay | Admitting: Physician Assistant

## 2020-01-01 ENCOUNTER — Other Ambulatory Visit: Payer: Self-pay

## 2020-01-01 DIAGNOSIS — I10 Essential (primary) hypertension: Secondary | ICD-10-CM

## 2020-01-01 DIAGNOSIS — E119 Type 2 diabetes mellitus without complications: Secondary | ICD-10-CM | POA: Insufficient documentation

## 2020-01-01 DIAGNOSIS — R69 Illness, unspecified: Secondary | ICD-10-CM | POA: Diagnosis present

## 2020-01-01 DIAGNOSIS — R7989 Other specified abnormal findings of blood chemistry: Secondary | ICD-10-CM | POA: Diagnosis present

## 2020-01-01 LAB — COMPREHENSIVE METABOLIC PANEL
ALT: 52 U/L — ABNORMAL HIGH (ref 0–44)
AST: 67 U/L — ABNORMAL HIGH (ref 15–41)
Albumin: 3.6 g/dL (ref 3.5–5.0)
Alkaline Phosphatase: 68 U/L (ref 38–126)
Anion gap: 17 — ABNORMAL HIGH (ref 5–15)
BUN: 20 mg/dL (ref 6–20)
CO2: 26 mmol/L (ref 22–32)
Calcium: 9 mg/dL (ref 8.9–10.3)
Chloride: 91 mmol/L — ABNORMAL LOW (ref 98–111)
Creatinine, Ser: 1.03 mg/dL (ref 0.61–1.24)
GFR calc Af Amer: >60 mL/min (ref >60)
GFR calc non Af Amer: >60 mL/min (ref >60)
Glucose, Bld: 182 mg/dL — ABNORMAL HIGH (ref 70–99)
Potassium: 3.1 mmol/L — ABNORMAL LOW (ref 3.5–5.1)
Sodium: 134 mmol/L — ABNORMAL LOW (ref 135–145)
Total Bilirubin: 1 mg/dL (ref 0.3–1.2)
Total Protein: 8.4 g/dL — ABNORMAL HIGH (ref 6.5–8.1)

## 2020-01-01 LAB — LIPID PANEL
Cholesterol: 165 mg/dL (ref 0–200)
HDL: 25 mg/dL — ABNORMAL LOW (ref >40)
LDL Cholesterol: 67 mg/dL (ref 0–99)
Total CHOL/HDL Ratio: 6.6 RATIO
Triglycerides: 363 mg/dL — ABNORMAL HIGH (ref <150)
VLDL: 73 mg/dL — ABNORMAL HIGH (ref 0–40)

## 2020-01-01 NOTE — Telephone Encounter (Signed)
    Thank you for making me aware. Labs reviewed. His total cholesterol has improved from 179 is now down to 165. LDL improved from 87 to 61. Triglycerides are still elevated and he needs to decrease the amount of saturated fat in his diet.  In regards to his metabolic panel, his sodium is slightly low but consistent with prior readings. Kidney function overall remains stable. K+ is low at 3.1. I would recommend we increase K-dur from 40 mEq daily to 60 mEq daily. Should still take extra on the days he takes Metolazone.   Thanks,  [place]

## 2020-01-01 NOTE — Telephone Encounter (Signed)
Labs are available in Epic.  CMET and Lipid.  K+, sodium and chloride are low.

## 2020-01-01 NOTE — Telephone Encounter (Signed)
Pt would like to inform [place], that he went to the [facility] for labs as instructed.   Please call if needed [phone]    Thanks renee

## 2020-01-02 MED ORDER — POTASSIUM CHLORIDE CRYS ER 20 MEQ PO TBCR
EXTENDED_RELEASE_TABLET | ORAL | 3 refills | Status: DC
Start: 2020-01-02 — End: 2020-01-08

## 2020-01-02 NOTE — Telephone Encounter (Signed)
I spoke with patient and reviewed lab results with him.He understands to increase potassium to 60 meq daily and take an additional 20 meq on the day he takes his weekly metolazone dose. New rx sent to [facility]

## 2020-01-06 ENCOUNTER — Ambulatory Visit: Payer: Self-pay | Admitting: Physician Assistant

## 2020-01-06 NOTE — Congregational Nurse Program (Signed)
[facility] remote patient monitoring program for hypertension in collaboration with [facility].  Client is consistently measuring blood pressures and uploading to the platform per program guidelines. *Note client's next free clinic appointment scheduled 01/06/20. Will plan to send provider log of recorded blood pressure readings.   Last 7 days averages recorded readings;  Last 7 day recorded reading average: 138/82  Last 7 day recorded highest average reading: 161/84 Last 7 day recorded lowest average reading: 110/60   Plan:  RN will continue to monitor readings daily during clinic hours as per program guidelines  Client will continue with weekly health coaching and support for personal action plan by Lissa Hoard LPN  RN will continue to meet weekly with W. Crowder LPN to discuss client's progress and any barriers to meeting goals.  Will continue to support and counsel client regarding DASH diet as well as Diabetic diet choices. Client is currently monitoring fasting blood sugars daily and keeping log sheet for provider. Have discussed eliminating sugary drinks and snacks. Discussed having consistent meal times and taking medications daily at a consistent time .   Debria Garret RN  [employer] [employer]

## 2020-01-08 ENCOUNTER — Other Ambulatory Visit: Payer: Self-pay | Admitting: Student

## 2020-01-08 ENCOUNTER — Other Ambulatory Visit: Payer: Self-pay | Admitting: Physician Assistant

## 2020-01-08 MED ORDER — RIVAROXABAN 20 MG PO TABS: ORAL_TABLET | ORAL | 1 refills | Status: DC

## 2020-01-08 MED ORDER — GABAPENTIN 100 MG PO CAPS: 100.0000 mg | ORAL_CAPSULE | Freq: Two times a day (BID) | ORAL | 0 refills | Status: DC | PRN

## 2020-01-08 MED ORDER — METOLAZONE 5 MG PO TABS: 5.0000 mg | ORAL_TABLET | ORAL | 3 refills | Status: DC

## 2020-01-08 MED ORDER — METFORMIN HCL 850 MG PO TABS: 850.0000 mg | ORAL_TABLET | Freq: Two times a day (BID) | ORAL | 0 refills | Status: DC

## 2020-01-08 MED ORDER — ALLOPURINOL 300 MG PO TABS: ORAL_TABLET | ORAL | 0 refills | Status: DC

## 2020-01-08 MED ORDER — RIVAROXABAN 20 MG PO TABS: ORAL_TABLET | ORAL | 0 refills | Status: DC

## 2020-01-08 MED ORDER — FUROSEMIDE 80 MG PO TABS: 80.0000 mg | ORAL_TABLET | Freq: Two times a day (BID) | ORAL | 3 refills | Status: DC

## 2020-01-08 MED ORDER — POTASSIUM CHLORIDE CRYS ER 20 MEQ PO TBCR: EXTENDED_RELEASE_TABLET | ORAL | 1 refills | Status: DC

## 2020-01-08 MED ORDER — FLUTICASONE-SALMETEROL 250-50 MCG/DOSE IN AEPB: INHALATION_SPRAY | RESPIRATORY_TRACT | 0 refills | Status: DC

## 2020-01-08 MED ORDER — METOPROLOL TARTRATE 100 MG PO TABS: 100.0000 mg | ORAL_TABLET | Freq: Two times a day (BID) | ORAL | 0 refills | Status: DC

## 2020-01-08 MED ORDER — METOPROLOL TARTRATE 100 MG PO TABS: 100.0000 mg | ORAL_TABLET | Freq: Two times a day (BID) | ORAL | 1 refills | Status: DC

## 2020-01-08 NOTE — Telephone Encounter (Signed)
New message     *STAT* If patient is at the pharmacy, call can be transferred to refill team.   1. Which medications need to be refilled? (please list name of each medication and dose if known)  [facility] transfer [facility] to [facility], [facility] will no longer fill medication   2. Which pharmacy/location (including street and city if local pharmacy) is medication to be sent to? [facility] in [place]   3. Do they need a 30 day or 90 day supply?  [place]

## 2020-01-08 NOTE — Telephone Encounter (Signed)
refilled cardiac meds to [facility]

## 2020-01-09 NOTE — Congregational Nurse Program (Signed)
[facility] remote patient monitoring program for hypertension last 7 day averages. Client has been consistently monitoring per program guidelines and is adhering to medications prescribed. Client currently in midst of change in providers due to receiving Medicare. Client transitioning for [facility] to a new patient appointment in September with [facility].   Last 7 day averages as reported in the platform  Last 7 day recorded blood pressure average: 143/85 Last 7 day recorded highest blood pressure average: 158/96 Last 7 day recorded lowest blood pressure average: 113/62    PLAN:  RN will continue to monitor the reported readings daily during clinic hours and note any escalations or trends that require intervention per program guidelines.  Client will continue to have weekly health coaching and support from [place] will continue to discuss weekly client's progress and address any potential barriers to meeting goals set.

## 2020-01-20 NOTE — Congregational Nurse Program (Signed)
[facility] Remote patient monitoring for hypertension 7 day average of recorded readings per program guidelines. Client is consistently monitoring. Continued to be encouraged regarding diet and lifestyle changes. Client's activity regarding exercise is somewhat limited due to COPD, CHF and Joint issues. Client is also in transition from [facility] provider who had recommended rersistance bands and chair exercises to a new provider at [facility] in September. Change is due to client obtaining insurance. Medicare.   Last 7 day average of recorded blood pressures:  Last 7 day average blood pressure: 145/84 Last 7 day highest reading: 159/91 Last 7 day lowest reading: 117/69  Plan:  RN will continue to monitor daily readings uploaded on Dashboard and assess for any escalations or trends that would require intervention during clinic hours per program guidelines. NOTE: suggested to client that during interim provider to show his recent readings to his cardiologist on 01/23/20 (appointment is for Pacer evaluation EP)  Client will continue to have health coaching sessions with Lissa Hoard LPN for support and needs for changes to personal action plan.  RN will continue to meet with W. Crowder LPN weekly to discuss client's progress and address any potential barriers to client meeting program or personal goals.  Debria Garret RN  [name]

## 2020-01-21 ENCOUNTER — Telehealth: Payer: Self-pay

## 2020-01-21 NOTE — Telephone Encounter (Addendum)
Pt was f/u by phone for weekly update on conducted on 01/08/20 to address any challenges or successes of BP and Weight checks and Personal Action Plan.   States no problems with equipment and protocol for measuring BP and is feels confident.   SUCCESSES     Continues to monitor BP and weight daily per protocol of the hypertension program    Continues to take medications daily as scheduled    Continues to Attempt to monitor salt/sodium intake and utilize air fryer to help with avoid frying foods      BARRIERS -Laying in Bed due to lack of energy  Personal Action Plan Goals for this week of 01/12/20   -Challenge self to move sit up alongside of bed and conduct sitting exercise

## 2020-01-21 NOTE — Telephone Encounter (Signed)
Pt was f/u by phone for weekly update on conducted on 01/19/20 to address any challenges or successes of BP and Weight checks and Personal Action Plan.   States no problems with equipment and protocol for measuring BP and is feels confident.   SUCCESSES     Physically moving around more house compared to previous of week of tending to lay in bed more due to lack of energy    Continues to monitor BP and weight daily per protocol of the hypertension program    Continues to take medications daily as scheduled    Attempts to monitor salt/sodium intake and utilize air fryer to help with avoid frying foods      BARRIERS -Limited types of exercise due to tiring quickly -Have not obtained fit bit/monitor to measure steps to  [place] for this week of 01/19/20   -Challenge self to move more at home compared to previous week  -Obtain fit bit/pedometer from brother-in-law  to assist with keeping up with amount of steps of movement -Will provide some Nightly/PM BP readings or alternate on some days to assist

## 2020-01-21 NOTE — Telephone Encounter (Addendum)
Attempted to reach patient for weekly update related to remote monitoring hypertension program but no answer at the time on 12/31/19

## 2020-01-21 NOTE — Congregational Nurse Program (Signed)
Patient presented to [name] on 12/17/19 to initiate enrollment in the Remote Monitoring in the [place]-Hypertension Program and states readiness to participate.     Pt successfully onboarded with initial BP and scale test readings that connected  to the [place] and provider dashboard successfully.   Initial recorded [place] BP Test reading  was 134/8=83 (p.m. reading)  and weight was 483 and no shoes (on  [employer] Scale) observed in clinic.    Pt was provided with Remote Monitoriing in the [place]HT[facility] patient facing booklet that reviewed all program requirements, guidelines, dash diet brochure/guidelines, proper techinque for monitoring and troubleshooting equipment.    -Reviewed and verified patient's BP Medication and consistency of use with patient.   - Pt understood guidelines and expectations of program,  -Pt understood and demonstrated proper use of BP and Scale equipment by way of teachbacks after it was demonstrated to pt - Pt was provided BP Basics Manual with a coordinating BP-Personal Action Plan that was completed on site and placed in chart --Pt identified and created personal action plan goals successully          Personal Action Plan:        -Pt will check BP twice daily 6 days a week (in the mornings- will be based on time he gets up) -Pt will monitor weight once (1) daily without clothing  -Pt will increase activity level to "moderately active" with including more of brisk walks -Pt will continue to f/u with provider as recommended -Pt will  continue medications as prescribed  -Next Day/Check in F/U will be conducted on 12/18/19   -1st Weekly F/U update will be made on 8/09//21 to check in regarding 1st day use of the equipment and to address any questions   -RN Case Manager will review   BP readings daily and provide 7 day average and trends weekly and communicate

## 2020-01-23 ENCOUNTER — Other Ambulatory Visit: Payer: Self-pay

## 2020-01-23 ENCOUNTER — Ambulatory Visit (INDEPENDENT_AMBULATORY_CARE_PROVIDER_SITE_OTHER): Payer: Medicare Other | Admitting: Internal Medicine

## 2020-01-23 ENCOUNTER — Encounter: Payer: Self-pay | Admitting: Internal Medicine

## 2020-01-23 VITALS — BP 138/76 | HR 63 | Ht 71.0 in | Wt >= 6400 oz

## 2020-01-23 DIAGNOSIS — I441 Atrioventricular block, second degree: Secondary | ICD-10-CM | POA: Diagnosis not present

## 2020-01-23 DIAGNOSIS — I10 Essential (primary) hypertension: Secondary | ICD-10-CM | POA: Diagnosis not present

## 2020-01-23 DIAGNOSIS — Z952 Presence of prosthetic heart valve: Secondary | ICD-10-CM

## 2020-01-23 DIAGNOSIS — I5032 Chronic diastolic (congestive) heart failure: Secondary | ICD-10-CM

## 2020-01-23 DIAGNOSIS — G4733 Obstructive sleep apnea (adult) (pediatric): Secondary | ICD-10-CM

## 2020-01-23 DIAGNOSIS — I442 Atrioventricular block, complete: Secondary | ICD-10-CM | POA: Diagnosis not present

## 2020-01-23 LAB — CUP PACEART INCLINIC DEVICE CHECK
Date Time Interrogation Session: 20210903101314
Implantable Lead Implant Date: 20190409
Implantable Lead Implant Date: 20190409
Implantable Lead Location: 753859
Implantable Lead Location: 753860
Implantable Pulse Generator Implant Date: 20190409
Pulse Gen Model: 2272
Pulse Gen Serial Number: 9010865

## 2020-01-23 NOTE — Progress Notes (Signed)
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  Since last being seen in our clinic, the patient reports doing reasonably well.  He is not very active.  + SOB and edema chronically.  Today, he denies symptoms of palpitations, chest pain,  dizziness, presyncope, or syncope.  The patient is otherwise without complaint today.   Past Medical History:  Diagnosis Date  . Anxiety   . Aortic stenosis   . Arthritis   . Asthma   . Back pain   . Cellulitis of skin with lymphangitis   . CHF (congestive heart failure) ([place])   . Chronic diastolic congestive heart failure ([place])   . Chronic venous insufficiency   . Constipation   . COPD (chronic obstructive pulmonary disease) ([place])   . Depression   . Dyspnea   . Essential hypertension   . Gout   . Heart murmur   . Hypertension   . Morbid obesity ([place])   . Obesity   . Pneumonia    walking pneumonia  . Pre-diabetes   . Prostatitis   . Pulmonary embolism ([place])   . Pulmonary hypertension ([place])   . Pyelonephritis   . S/P aortic valve replacement with bioprosthetic valve 08/23/2017   25 mm Edwards Inspiris Resilia stented bovine pericardial tissue valve  . 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 Sleep apnea    cpap   . Thoracic ascending aortic aneurysm ([place])   . Thoracic ascending aortic aneurysm ([place])   . Tobacco abuse    Past Surgical History:  Procedure Laterality Date  . AORTIC VALVE REPLACEMENT N/A 08/23/2017   Procedure: AORTIC VALVE REPLACEMENT (AVR) USING INSPIRIS RESILIA AORTIC VALVE SIZE 25 MM;  Surgeon: Rexene Alberts, MD;  Location: [facility];  Service: Open Heart Surgery;  Laterality: N/A;  . MULTIPLE EXTRACTIONS WITH ALVEOLOPLASTY N/A 07/23/2017   Procedure: Extraction of tooth #10 with alveoloplasty and gross debridement of remaining  teeth;  Surgeon: Lenn Cal, DDS;  Location: [facility];  Service: Oral Surgery;  Laterality: N/A;  . NO PAST SURGERIES    . PACEMAKER IMPLANT N/A 08/28/2017    St Jude Medical Assurity MRI conditional  dual-chamber pacemaker for symptomatic complete heart blockby Dr Rayann Heman  . TEE WITHOUT CARDIOVERSION N/A 07/16/2017   Procedure: TRANSESOPHAGEAL ECHOCARDIOGRAM (TEE);  Surgeon: Lelon Perla, MD;  Location: [facility]COPY;  Service: Cardiovascular;  Laterality: N/A;  . TEE WITHOUT CARDIOVERSION N/A 08/23/2017   Procedure: TRANSESOPHAGEAL ECHOCARDIOGRAM (TEE);  Surgeon: Rexene Alberts, MD;  Location: [facility];  Service: Open Heart Surgery;  Laterality: N/A;  . THORACIC AORTIC ANEURYSM REPAIR N/A 08/23/2017   Procedure: THORACIC ASCENDING ANEURYSM REPAIR (AAA) USING HEMASHIELD GOLD KNITTED MICROVEL DOUBLE VELOUR VASCULAR GRAFT D: 24 MM  L: 30 CM;  Surgeon: Rexene Alberts, MD;  Location: [facility];  Service: Open Heart Surgery;  Laterality: N/A;    ROS- all systems are reviewed and negative except as per HPI above  Current Outpatient Medications  Medication Sig Dispense Refill  . acetaminophen (TYLENOL) 325 MG tablet Take 650 mg by mouth every 6 (six) hours as needed for mild pain.    Marland Kitchen albuterol (PROVENTIL) (2.5 MG/3ML) 0.083% nebulizer solution INHALE 1 VIAL VIA NEBULIZER EVERY 6 HOURS AS NEEDED FOR WHEEZING OR SHORTNESS OF BREATH 270 mL 1  .  allopurinol (ZYLOPRIM) 300 MG tablet TAKE 1 Tablet BY MOUTH ONCE EVERY DAY 30 tablet 0  . busPIRone (BUSPAR) 10 MG tablet Take 10 mg by mouth 3 (three) times daily.     . citalopram (CELEXA) 40 MG tablet Take 40 mg by mouth daily.     Marland Kitchen docusate sodium (COLACE) 100 MG capsule Take 200 mg by mouth daily as needed for mild constipation.     . Fluticasone-Salmeterol (WIXELA INHUB) 250-50 MCG/DOSE AEPB INHALE 1 PUFF BY MOUTH 2 TIMES DAILY. RINSE MOUTH AFTER USE. 60 each 0  . furosemide (LASIX) 80 MG tablet Take 1 tablet (80 mg total) by mouth 2 (two) times daily.  180 tablet 3  . gabapentin (NEURONTIN) 100 MG capsule Take 1 capsule (100 mg total) by mouth 2 (two) times daily as needed. 60 capsule 0  . ibuprofen (ADVIL) 200 MG tablet Take 200 mg by mouth 2 (two) times daily as needed.    Javier Docker Oil 350 MG CAPS Take 1 capsule by mouth in the morning and at bedtime.     Marland Kitchen loratadine (CLARITIN) 10 MG tablet Take 10 mg by mouth daily as needed for allergies.    . metFORMIN (GLUCOPHAGE) 850 MG tablet Take 1 tablet (850 mg total) by mouth 2 (two) times daily with a meal. 60 tablet 0  . metolazone (ZAROXOLYN) 5 MG tablet Take 1 tablet (5 mg total) by mouth once a week. 12 tablet 3  . metoprolol tartrate (LOPRESSOR) 100 MG tablet Take 1 tablet (100 mg total) by mouth 2 (two) times daily. 180 tablet 1  . potassium chloride SA (KLOR-CON M20) 20 MEQ tablet Take 60 meq daily and take an additional 20 meq on the day you take your weekly metolazone dose 94 tablet 1  . PROVENTIL HFA 108 (90 Base) MCG/ACT inhaler INHALE 2 PUFFS BY MOUTH EVERY 6 HOURS AS NEEDED FOR COUGHING, WHEEZING, OR SHORTNESS OF BREATH 6.7 g 1  . rivaroxaban (XARELTO) 20 MG TABS tablet TAKE 1 Tablet BY MOUTH ONCE EVERY DAY WITH SUPPER 90 tablet 1   No current facility-administered medications for this visit.    Physical Exam: Vitals:   01/23/20 0938  BP: 138/76  Pulse: 63  SpO2: 96%  Weight: (!) 475 lb 12.8 oz (215.8 kg)  Height: 5\' 11"  (1.803 m)    GEN- The patient is morbidly obese appearing, alert and oriented x 3 today.   Head- morbidly obese, atraumatic Eyes-  Sclera clear, conjunctiva pink Ears- hearing intact Oropharynx- clear Lungs-  normal work of breathing Chest- pacemaker pocket is well healed Heart- Regular rate and rhythm  GI- soft  Extremities- no clubbing, cyanosis, + edema  Pacemaker interrogation- reviewed in detail today,  See PACEART report   Assessment and Plan:  1. 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 afib burden <1% He is on xarelto for stroke prevention.  He also has a h/o bilateral PTE He worries   3. HTN Stable No change required today  4. OSA Compliance with CPAP is advised  5. Obesity Lifestyle modification is advised  6. S/p bioprosthetic AVR with thoracic aneurysm repiar Stable No change required today  7. Chronic diastolic dysfunction Stable No change required today  Risks, benefits and potential toxicities for medications prescribed and/or refilled reviewed with patient today.   return to see me in a year  Thompson Grayer MD, [facility] 01/23/2020 9:43  AM

## 2020-01-23 NOTE — Patient Instructions (Signed)
Medication Instructions:  °Continue all current medications. ° °Labwork: °none ° °Testing/Procedures: °none ° °Follow-Up: °[place] - 1 year  ° °Any Other Special Instructions Will Be Listed Below (If Applicable). ° °If you need a refill on your cardiac medications before your next appointment, please call your pharmacy. ° °

## 2020-01-26 ENCOUNTER — Other Ambulatory Visit: Payer: Self-pay | Admitting: Physician Assistant

## 2020-01-29 ENCOUNTER — Telehealth: Payer: Self-pay

## 2020-01-29 NOTE — Telephone Encounter (Signed)
Pt returned missed phone call from earlier in the day to provide weekly update to address any challenges or successes of BP and Weight checks and Personal Action Plan within the Remote Monitoring Hypertension Program.     Pt reported that he will be seeing his new primary care provider at [facility] on 02/10/20.     SUCCESSES      -Continues to feel confident about use of equipment and protocol adherence within program    -Was able to be active a little more than than usual by getting out of the house at least 5 times     (went to [employer] 3 times in one day,, flea market ( 2 days) and had to walk more at that time)    -Continues to monitor BP and weight daily per protocol of the hypertension program   - Continues to take medications daily as scheduled   - Do not add salt to directly to food, to assist with reducing sodium intake   - Attempted to utilize weights for more of arm use while sitting alongside bed  - Attempts to try to stay more relaxed and less stressed than normal          BARRIERS -Limited types of exercise due to tiring quickly that comes along with his increased fluid levels -Hard to stay away from process and pre-cooked frozen meals, can foods  -Forgetting to perform some nightly/evening BP check due to have a set routine of being used to checking BP in morning -Inability to keep up steps of movement as desired (previous got from brother-in-law and was too tight for wrist and caused rash) -Limited mobility with knees due to prior and current history of torn meniscus.   Peronal Action Plan Goals for weekl   -Challenge self to move more at home compared to previous week (Increase movement to at least 6 times for the week to get out of the house)  - Continue to work on obtaining fit bit/pedometer either by family member or BP health coach (Will   -Will challenge self to perform 1-2 days of Nightly/PM BP readings to current readings    -Add oatmeal to diet back in  mornings  RECOMMENDATIONS:     -To put in action the new Personal Action BP plan goals that were discussed above     -A print out of his  BP readings will we be shared with him and his provider to be given to his provider at the time of his appt on 02/10/20     -Attempt to perform some leg lifts versus knee bending exercises to prevent discomfort of knee when exercising

## 2020-01-29 NOTE — Telephone Encounter (Signed)
Attempted to follow up with patient to receive a weekly update regarding patient's BP management and monitoring, but phone call unsuccessful due to patient being unavailable at time of phone call.  Will follow up again on 01/29/20 if patient does not return call prior to 01/29/20

## 2020-02-08 IMAGING — CT CT ANGIO CHEST
3 of 12 series · 13 of 36 positions shown · IV contrast (APPLIED)
Comparison: None.

CLINICAL DATA: 46-year-old male with history of severe aortic
stenosis. Preprocedural study prior to potential transcatheter
aortic valve replacement (TAVR).

EXAM:
CT ANGIOGRAPHY CHEST, ABDOMEN AND PELVIS
TECHNIQUE: Multidetector CT imaging through the chest, abdomen and pelvis was
performed using the standard protocol during bolus administration of
intravenous contrast. Multiplanar reconstructed images and MIPs were
obtained and reviewed to evaluate the vascular anatomy.
CONTRAST:  100 mL of Omnipaque 350.

[Series 8: thins · axial · 0.59mm/px · z∈[+751,+1422]mm · 3 of 959 slices shown]
[im 1/959  lung]
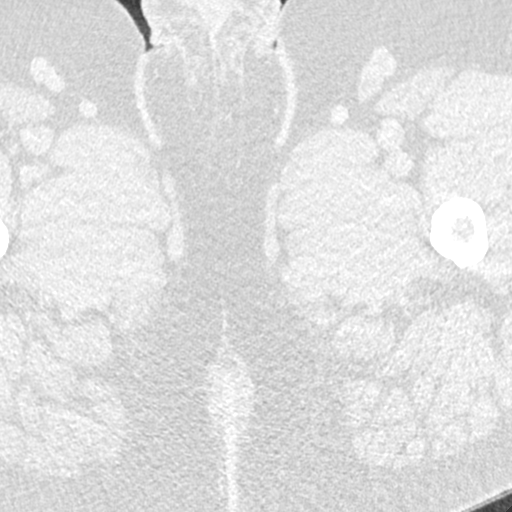
[im 480/959  mediastinal]
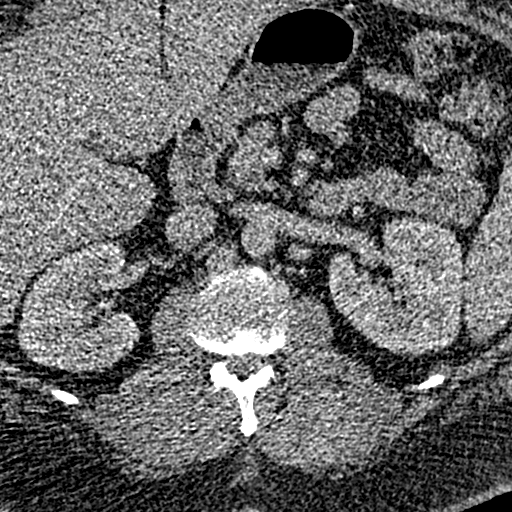
[im 959/959  lung]
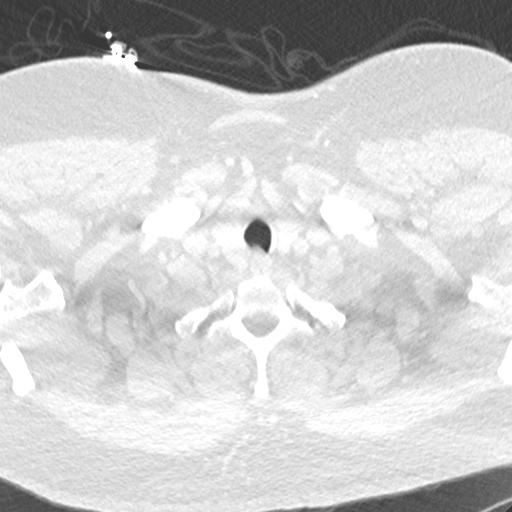

[Series 10: 0-90% · axial · 0.44mm/px · z∈[+1171,+1369]mm · 8 of 3770 slices shown]
[im 236/3770  lung]
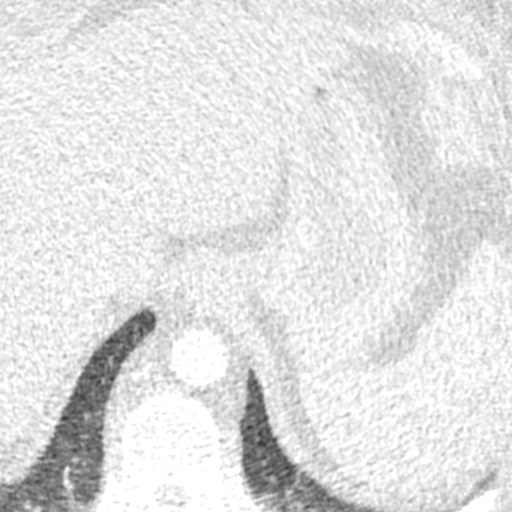
[im 707/3770  lung]
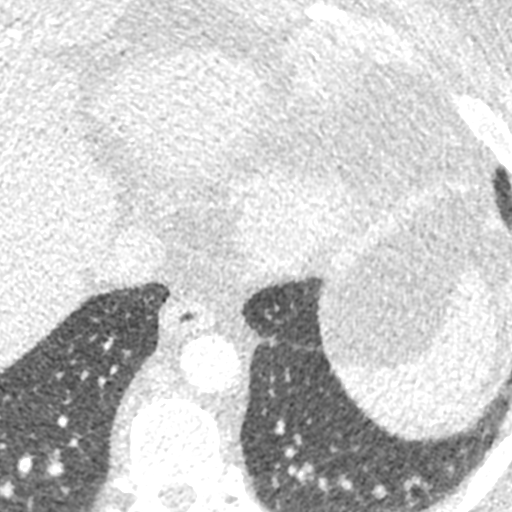
[im 1178/3770  lung]
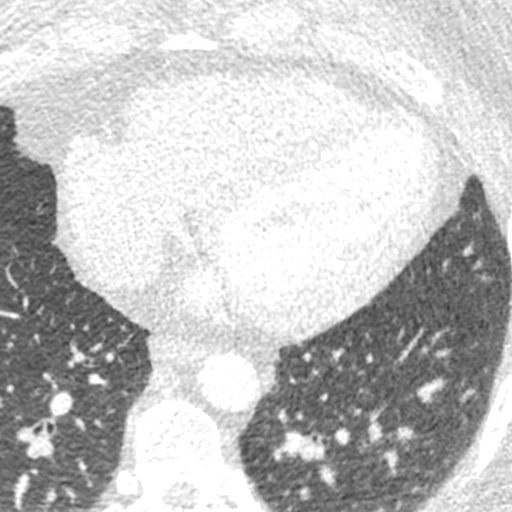
[im 1649/3770  lung]
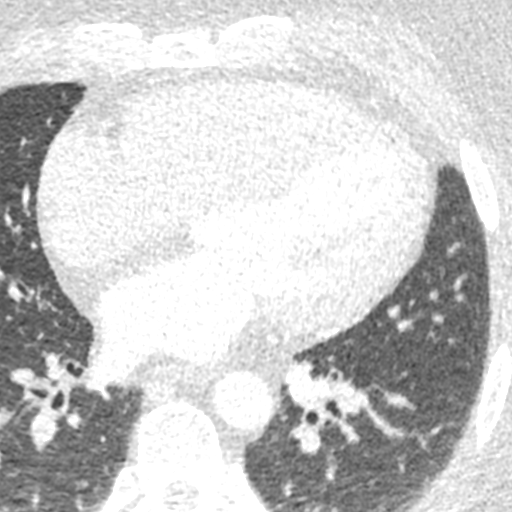
[im 2121/3770  lung]
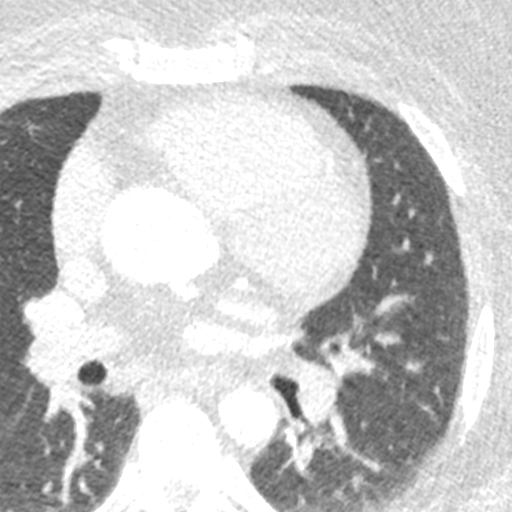
[im 2592/3770  lung]
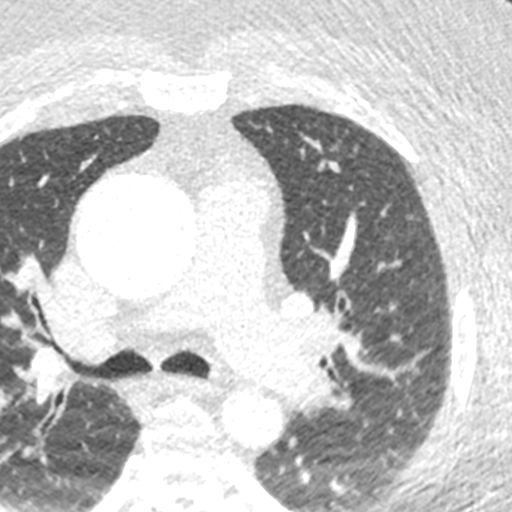
[im 3063/3770  lung]
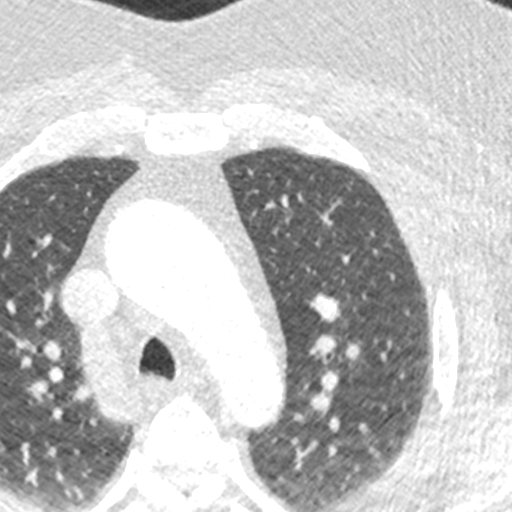
[im 3534/3770  lung]
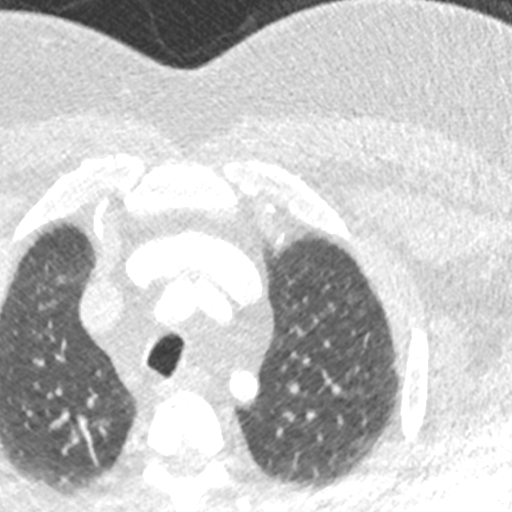

[Series 14: cor 67 % · coronal · 0.43mm/px · 2 of 144 slices shown]
[im 48/144  mediastinal]
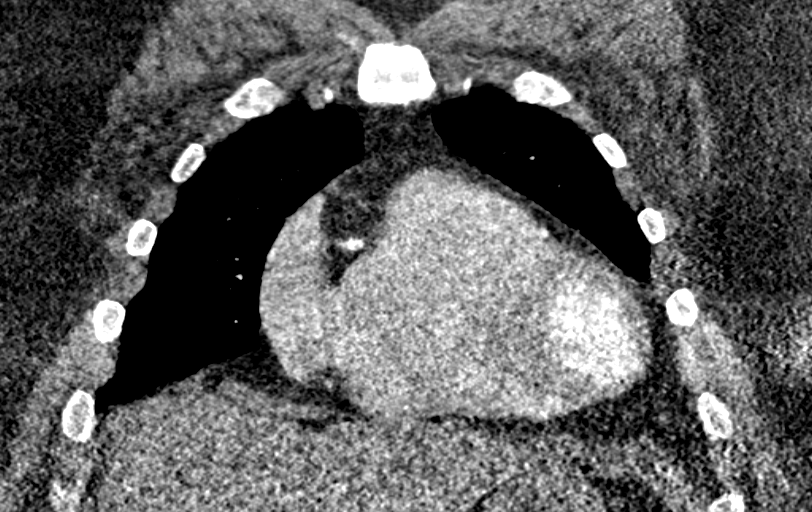
[im 96/144  mediastinal]
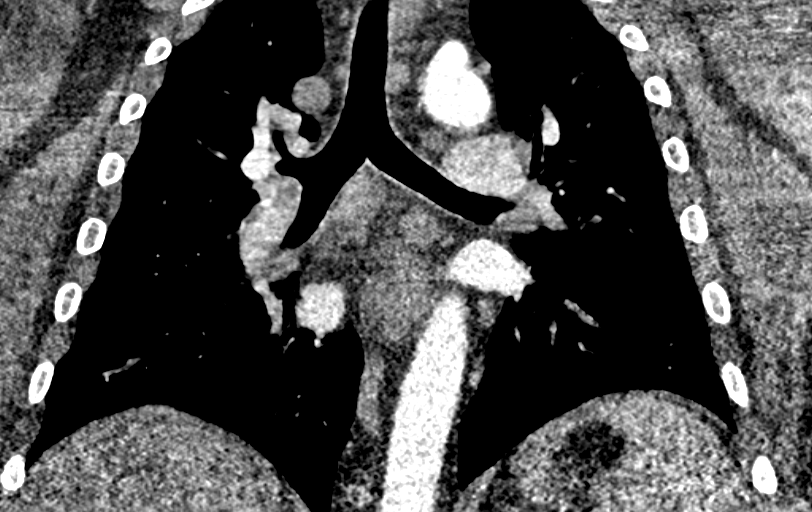

[13 of 36 positions shown; findings below may reference images not displayed]

FINDINGS: Comment: Patient's large body habitus and suboptimal contrast bolus
on today's examination is severely limits assessment of visceral
and/or vascular findings. Specifically, assessment of the abdominal
and pelvic vasculature as it pertains to potential TAVR procedure is
considered nondiagnostic.

CTA CHEST FINDINGS

Cardiovascular: Heart size is mildly enlarged. There is no
significant pericardial fluid, thickening or pericardial
calcification. Aneurysmal dilatation of the ascending thoracic aorta
which measures up to 5.2 x 5.3 cm (mean diameter of 52.5 mm).
Calcifications of the aortic valve.

Mediastinum/Lymph Nodes: No pathologically enlarged mediastinal or
hilar lymph nodes. Esophagus is unremarkable in appearance. No
axillary lymphadenopathy.

Lungs/Pleura: No suspicious appearing pulmonary nodules or masses.
No acute consolidative airspace disease. No pleural effusions.

Musculoskeletal/Soft Tissues: There are no aggressive appearing
lytic or blastic lesions noted in the visualized portions of the
skeleton.

CTA ABDOMEN AND PELVIS FINDINGS

Hepatobiliary: No suspicious cystic or solid hepatic lesions. No
intra or extrahepatic biliary ductal dilatation. Gallbladder is
normal in appearance.

Pancreas: No pancreatic mass. No pancreatic ductal dilatation. No
pancreatic or peripancreatic fluid or inflammatory changes.

Spleen: Unremarkable.

Adrenals/Urinary Tract: Bilateral kidneys and bilateral adrenal
glands are normal in appearance. No hydroureteronephrosis. Urinary
bladder is normal in appearance.

Stomach/Bowel: Normal appearance of the stomach. No pathologic
dilatation of small bowel or colon. Normal appendix.

Vascular/Lymphatic: Aortic atherosclerosis. No definite aneurysm
identified in the abdominal or pelvic vasculature. No
lymphadenopathy noted in the abdomen or pelvis.

Reproductive: Prostate gland and seminal vesicles are unremarkable
in appearance.

Other: No significant volume of ascites.  No pneumoperitoneum.

Musculoskeletal: There are no aggressive appearing lytic or blastic
lesions noted in the visualized portions of the skeleton.

VASCULAR MEASUREMENTS PERTINENT TO TAVR:

Assessment of the abdominal and pelvic vasculature relevant to
potential TAVR procedure is considered nondiagnostic due to the
suboptimal contrast bolus.
IMPRESSION: 1. Nondiagnostic study (with respect to assessment of abdominal and
pelvic vasculature) due to suboptimal contrast bolus and large body
habitus. Repeat examination with optimized contrast bolus is
recommended to ensure suitability for TAVR procedure.
2. Calcifications of the aortic valve, compatible with the reported
clinical history of aortic stenosis.
3. Aortic atherosclerosis with aneurysmal dilatation of the
ascending thoracic aorta which has a mean diameter of 52.5 mm.
Ascending thoracic aortic aneurysm. Recommend semi-annual imaging
followup by CTA or MRA and referral to cardiothoracic surgery if not
already obtained. This recommendation follows 0919
ACCF/AHA/AATS/ACR/ASA/SCA/JOHANNA/GHELLER/ILIANA/TENA Guidelines for the
Diagnosis and Management of Patients With Thoracic Aortic Disease.
Circulation. 0919; 121: [id number].

Aortic Atherosclerosis (3PO1N-OAC.C).

## 2020-02-10 ENCOUNTER — Other Ambulatory Visit: Payer: Self-pay

## 2020-02-10 ENCOUNTER — Ambulatory Visit (INDEPENDENT_AMBULATORY_CARE_PROVIDER_SITE_OTHER): Payer: Medicare HMO | Admitting: Internal Medicine

## 2020-02-10 ENCOUNTER — Encounter: Payer: Self-pay | Admitting: Internal Medicine

## 2020-02-10 VITALS — BP 149/82 | HR 64 | Temp 97.4°F | Resp 22 | Ht 71.0 in | Wt >= 6400 oz

## 2020-02-10 DIAGNOSIS — G4733 Obstructive sleep apnea (adult) (pediatric): Secondary | ICD-10-CM

## 2020-02-10 DIAGNOSIS — Z131 Encounter for screening for diabetes mellitus: Secondary | ICD-10-CM | POA: Diagnosis not present

## 2020-02-10 DIAGNOSIS — Z23 Encounter for immunization: Secondary | ICD-10-CM | POA: Diagnosis not present

## 2020-02-10 DIAGNOSIS — Z09 Encounter for follow-up examination after completed treatment for conditions other than malignant neoplasm: Secondary | ICD-10-CM | POA: Insufficient documentation

## 2020-02-10 DIAGNOSIS — G609 Hereditary and idiopathic neuropathy, unspecified: Secondary | ICD-10-CM

## 2020-02-10 DIAGNOSIS — Z7689 Persons encountering health services in other specified circumstances: Secondary | ICD-10-CM

## 2020-02-10 DIAGNOSIS — Z953 Presence of xenogenic heart valve: Secondary | ICD-10-CM

## 2020-02-10 DIAGNOSIS — E119 Type 2 diabetes mellitus without complications: Secondary | ICD-10-CM | POA: Insufficient documentation

## 2020-02-10 DIAGNOSIS — Z950 Presence of cardiac pacemaker: Secondary | ICD-10-CM

## 2020-02-10 DIAGNOSIS — F419 Anxiety disorder, unspecified: Secondary | ICD-10-CM | POA: Diagnosis not present

## 2020-02-10 DIAGNOSIS — I480 Paroxysmal atrial fibrillation: Secondary | ICD-10-CM

## 2020-02-10 DIAGNOSIS — E1159 Type 2 diabetes mellitus with other circulatory complications: Secondary | ICD-10-CM | POA: Diagnosis not present

## 2020-02-10 DIAGNOSIS — M1A9XX0 Chronic gout, unspecified, without tophus (tophi): Secondary | ICD-10-CM | POA: Diagnosis not present

## 2020-02-10 DIAGNOSIS — Z9989 Dependence on other enabling machines and devices: Secondary | ICD-10-CM

## 2020-02-10 DIAGNOSIS — I5032 Chronic diastolic (congestive) heart failure: Secondary | ICD-10-CM

## 2020-02-10 DIAGNOSIS — E1169 Type 2 diabetes mellitus with other specified complication: Secondary | ICD-10-CM

## 2020-02-10 DIAGNOSIS — I10 Essential (primary) hypertension: Secondary | ICD-10-CM

## 2020-02-10 DIAGNOSIS — J449 Chronic obstructive pulmonary disease, unspecified: Secondary | ICD-10-CM

## 2020-02-10 LAB — POCT GLYCOSYLATED HEMOGLOBIN (HGB A1C)
HbA1c POC (<> result, manual entry): 7.3 % (ref 4.0–5.6)
HbA1c, POC (controlled diabetic range): 7.3 % — AB (ref 0.0–7.0)
HbA1c, POC (prediabetic range): 7.3 % — AB (ref 5.7–6.4)
Hemoglobin A1C: 7.3 % — AB (ref 4.0–5.6)

## 2020-02-10 MED ORDER — BUSPIRONE HCL 10 MG PO TABS: 10.0000 mg | ORAL_TABLET | Freq: Three times a day (TID) | ORAL | 3 refills | Status: DC

## 2020-02-10 MED ORDER — LINAGLIPTIN 5 MG PO TABS: 5.0000 mg | ORAL_TABLET | Freq: Every day | ORAL | Status: DC

## 2020-02-10 MED ORDER — ALLOPURINOL 300 MG PO TABS: ORAL_TABLET | ORAL | 0 refills | Status: DC

## 2020-02-10 MED ORDER — CITALOPRAM HYDROBROMIDE 40 MG PO TABS: 40.0000 mg | ORAL_TABLET | Freq: Every day | ORAL | 3 refills | Status: DC

## 2020-02-10 MED ORDER — ATORVASTATIN CALCIUM 40 MG PO TABS: 40.0000 mg | ORAL_TABLET | Freq: Every day | ORAL | 3 refills | Status: DC

## 2020-02-10 MED ORDER — GABAPENTIN 100 MG PO CAPS: 100.0000 mg | ORAL_CAPSULE | Freq: Two times a day (BID) | ORAL | 0 refills | Status: DC | PRN

## 2020-02-10 NOTE — Assessment & Plan Note (Signed)
Uses old CPAP for long time Needs to see Pulmonologist, referral provided

## 2020-02-10 NOTE — Assessment & Plan Note (Addendum)
Uses Albuterol inhaler PRN Can not afford Advair, would prefer only Albuterol inhaler Currently well-controlled

## 2020-02-10 NOTE — Patient Instructions (Signed)
Please follow up with Cardiologist and Pulmonologist.  Please ambulate as tolerated.  Please follow DASH diet.  DASH stands for Dietary Approaches to Stop Hypertension. The DASH diet is a healthy-eating plan designed to help treat or prevent high blood pressure (hypertension).  The DASH diet includes foods that are rich in potassium, calcium and magnesium. These nutrients help control blood pressure. The diet limits foods that are high in sodium, saturated fat and added sugars.  Studies have shown that the DASH diet can lower blood pressure in as little as two weeks. The diet can also lower low-density lipoprotein (LDL or "bad") cholesterol levels in the blood. High blood pressure and high LDL cholesterol levels are two major risk factors for heart disease and stroke.    DASH diet: Recommended servings The DASH diet provides daily and weekly nutritional goals. The number of servings you should have depends on your daily calorie needs.  Here's a look at the recommended servings from each food group for a 2,000-calorie-a-day DASH diet:  Grains: 6 to 8 servings a day. One serving is one slice bread, 1 ounce dry cereal, or 1/2 cup cooked cereal, rice or pasta. Vegetables: 4 to 5 servings a day. One serving is 1 cup raw leafy green vegetable, 1/2 cup cut-up raw or cooked vegetables, or 1/2 cup vegetable juice. Fruits: 4 to 5 servings a day. One serving is one medium fruit, 1/2 cup fresh, frozen or canned fruit, or 1/2 cup fruit juice. Fat-free or low-fat dairy products: 2 to 3 servings a day. One serving is 1 cup milk or yogurt, or 1 1/2 ounces cheese. Lean meats, poultry and fish: six 1-ounce servings or fewer a day. One serving is 1 ounce cooked meat, poultry or fish, or 1 egg. Nuts, seeds and legumes: 4 to 5 servings a week. One serving is 1/3 cup nuts, 2 tablespoons peanut butter, 2 tablespoons seeds, or 1/2 cup cooked legumes (dried beans or peas). Fats and oils: 2 to 3 servings a day. One  serving is 1 teaspoon soft margarine, 1 teaspoon vegetable oil, 1 tablespoon mayonnaise or 2 tablespoons salad dressing. Sweets and added sugars: 5 servings or fewer a week. One serving is 1 tablespoon sugar, jelly or jam, 1/2 cup sorbet, or 1 cup lemonade.

## 2020-02-10 NOTE — Assessment & Plan Note (Signed)
For h/o complete heart block Follows up with Cardiologist

## 2020-02-10 NOTE — Assessment & Plan Note (Signed)
In 2019 for AS Managed by Cardiologist

## 2020-02-10 NOTE — Assessment & Plan Note (Signed)
BP ranges in 140-150s/90s at home On Metoprolol 100 mg BID Lasix and Metolazine for CHF Follows with Cardiologist Not on ACEi or ARB

## 2020-02-10 NOTE — Assessment & Plan Note (Signed)
On Celexa and Buspar Refills provided

## 2020-02-10 NOTE — Progress Notes (Signed)
New Patient Office Visit  Subjective:  Patient ID: Reginald [name], male    DOB: 1970/10/11  Age: 49 y.o. MRN: [id number]  CC:  Chief Complaint  Patient presents with  . New Patient (Initial Visit)    new patient [facility] he hurts all the time gabapentin is not working has a torn meniscus however he cant have surgery due to weight he has been checking sugars and keeping up with weight     HPI Reginald [name] is a 49 year old male with past medical history of hypertension, aortic stenosis s/p aortic valve replacement and repair of ascending thoracic aortic aneurysm, paroxysmal atrial fibrillation, chronic CHF, h/o pacemaker (complete heart block), OSA on CPAP, COPD and depression presents for establishing care. Patient [facility] to [facility] to [facility] the past. Patient denies any active complaints today, and requests refill on his medications. Patient checks BP at home, which stays in 130s-140s/80s. Patient's blood glucose in morning are 140-170. Patient takes only Metformin, HbA1C in office shows increase to 7.3 from 6.6 (08/2018). Patient states that he does not ambulate much at home, but tries to watch his diet.  Patient follows up with Cardiologist regularly and is compliant with his cardiac medications. Patient uses CPAP for OSA for many years, but has not seen Pulmonologist recently. Patient uses Albuterol for COPD, but doesn't take Advair as he can't afford it. Patient denies dyspnea, chest pain, palpitations, nausea, vomiting or abdominal pain.  Past Medical History:  Diagnosis Date  . Anxiety   . Aortic stenosis   . Arthritis   . Asthma   . Back pain   . Cellulitis of skin with lymphangitis   . CHF (congestive heart failure) ([place])   . Chronic diastolic congestive heart failure ([place])   . Chronic venous insufficiency   . Constipation   . COPD (chronic obstructive pulmonary disease) ([place])   . Depression   . Dyspnea   . Essential hypertension   . Gout   . Heart murmur   .  Hypertension   . Morbid obesity ([place])   . Obesity   . Pneumonia    walking pneumonia  . Pre-diabetes   . Prostatitis   . Pulmonary embolism ([place])   . Pulmonary hypertension ()   . Pyelonephritis   . S/P aortic valve replacement with bioprosthetic valve 08/23/2017   25 mm Edwards Inspiris Resilia stented bovine pericardial tissue valve  . 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 Sleep apnea    cpap   . Thoracic ascending aortic aneurysm ([place])   . Thoracic ascending aortic aneurysm ([place])   . Tobacco abuse     Past Surgical History:  Procedure Laterality Date  . AORTIC VALVE REPLACEMENT N/A 08/23/2017   Procedure: AORTIC VALVE REPLACEMENT (AVR) USING INSPIRIS RESILIA AORTIC VALVE SIZE 25 MM;  Surgeon: Rexene Alberts, MD;  Location: [place];  Service: Open Heart Surgery;  Laterality: N/A;  . CARDIAC VALVE REPLACEMENT N/A    Phreesia 02/07/2020  . MULTIPLE EXTRACTIONS WITH ALVEOLOPLASTY N/A 07/23/2017   Procedure: Extraction of tooth #10 with alveoloplasty and gross debridement of remaining teeth;  Surgeon: Lenn Cal, DDS;  Location: [place];  Service: Oral Surgery;  Laterality: N/A;  . NO PAST SURGERIES    . PACEMAKER IMPLANT N/A 08/28/2017    St Jude Medical Assurity MRI conditional  dual-chamber pacemaker for symptomatic complete heart blockby Dr Rayann Heman  . TEE WITHOUT CARDIOVERSION  N/A 07/16/2017   Procedure: TRANSESOPHAGEAL ECHOCARDIOGRAM (TEE);  Surgeon: Lelon Perla, MD;  Location[facility] ENDOSCOPY;  Service: Cardiovascular;  Laterality: N/A;  . TEE WITHOUT CARDIOVERSION N/A 08/23/2017   Procedure: TRANSESOPHAGEAL ECHOCARDIOGRAM (TEE);  Surgeon: Rexene Alberts, MD;  Location: [place];  Service: Open Heart Surgery;  Laterality: N/A;  . THORACIC AORTIC ANEURYSM REPAIR N/A 08/23/2017   Procedure: THORACIC ASCENDING ANEURYSM REPAIR (AAA) USING HEMASHIELD GOLD KNITTED MICROVEL DOUBLE VELOUR VASCULAR GRAFT D: 24 MM  L: 30 CM;   Surgeon: Rexene Alberts, MD;  Location: [place];  Service: Open Heart Surgery;  Laterality: N/A;    Family History  Problem Relation Age of Onset  . Hypertension Mother   . Alzheimer's disease Mother   . Heart attack Brother   . Diabetes Brother     Social History   Socioeconomic History  . Marital status: Divorced    Spouse name: Not on file  . Number of children: Not on file  . Years of education: Not on file  . Highest education level: Not on file  Occupational History  . Not on file  Tobacco Use  . Smoking status: Former Smoker    Packs/day: 2.00    Years: 16.00    Pack years: 32.00    Types: Cigarettes    Quit date: 08/23/2017    Years since quitting: 2.4  . Smokeless tobacco: Never Used  Vaping Use  . Vaping Use: Never used  Substance and Sexual Activity  . Alcohol use: No    Comment: have had alcohol in the past, not heavy  . Drug use: No  . Sexual activity: Not on file  Other Topics Concern  . Not on file  Social History Narrative  . Not on file   Social Determinants of Health   Financial Resource Strain:   . Difficulty of Paying Living Expenses: Not on file  Food Insecurity:   . Worried About Charity fundraiser in the Last Year: Not on file  . Ran Out of Food in the Last Year: Not on file  Transportation Needs:   . Lack of Transportation (Medical): Not on file  . Lack of Transportation (Non-Medical): Not on file  Physical Activity:   . Days of Exercise per Week: Not on file  . Minutes of Exercise per Session: Not on file  Stress:   . Feeling of Stress : Not on file  Social Connections:   . Frequency of Communication with Friends and Family: Not on file  . Frequency of Social Gatherings with Friends and Family: Not on file  . Attends Religious Services: Not on file  . Active Member of Clubs or Organizations: Not on file  . Attends Archivist Meetings: Not on file  . Marital Status: Not on file  Intimate Partner Violence:   . Fear of  Current or Ex-Partner: Not on file  . Emotionally Abused: Not on file  . Physically Abused: Not on file  . Sexually Abused: Not on file    ROS Review of Systems  Constitutional: Negative for chills and fever.  HENT: Negative for congestion and sore throat.   Eyes: Negative for pain and discharge.  Respiratory: Negative for cough and shortness of breath.   Cardiovascular: Negative for chest pain and palpitations.  Gastrointestinal: Negative for constipation, diarrhea, nausea and vomiting.  Endocrine: Negative for polydipsia and polyuria.  Genitourinary: Negative for dysuria and hematuria.  Musculoskeletal: Negative for neck pain and neck stiffness.  Skin: Negative  for rash.  Neurological: Negative for dizziness, weakness, numbness and headaches.  Psychiatric/Behavioral: Negative for agitation and behavioral problems.    Objective:   Today's Vitals: BP (!) 149/82 (BP Location: Right Arm, Patient Position: Sitting, Cuff Size: Normal)   Pulse 64   Temp (!) 97.4 F (36.3 C) (Temporal)   Resp (!) 22   Ht 5' 11" (1.803 m)   Wt (!) 475 lb 1.9 oz (215.5 kg)   SpO2 97%   BMI 66.27 kg/m   Physical Exam Vitals reviewed.  Constitutional:      General: He is not in acute distress.    Appearance: He is obese. He is not diaphoretic.  HENT:     Head: Normocephalic and atraumatic.     Nose: Nose normal.     Mouth/Throat:     Mouth: Mucous membranes are moist.  Eyes:     General: No scleral icterus.    Extraocular Movements: Extraocular movements intact.     Pupils: Pupils are equal, round, and reactive to light.  Cardiovascular:     Rate and Rhythm: Normal rate and regular rhythm.     Pulses: Normal pulses.     Heart sounds: Murmur (Systolic, most pronounced in right upper sternal border) heard.  No friction rub. No gallop.   Pulmonary:     Breath sounds: Normal breath sounds. No wheezing or rales.  Abdominal:     Palpations: Abdomen is soft.     Tenderness: There is no  abdominal tenderness.  Musculoskeletal:     Cervical back: Neck supple. No tenderness.     Right lower leg: No edema.     Left lower leg: No edema.  Skin:    General: Skin is warm.     Findings: No rash.  Neurological:     General: No focal deficit present.     Mental Status: He is alert and oriented to person, place, and time.  Psychiatric:        Mood and Affect: Mood normal.        Behavior: Behavior normal.     Assessment & Plan:     Problem List Items Addressed This Visit  Encounter to establish care   Care established History and medications reviewed          Cardiovascular and Mediastinum   Essential hypertension (Chronic)    BP ranges in 140-150s/90s at home On Metoprolol 100 mg BID Lasix and Metolazine for CHF Follows with Cardiologist Not on ACEi or ARB      Relevant Medications   atorvastatin (LIPITOR) 40 MG tablet   Chronic heart failure with preserved ejection fraction (HFpEF) (HCC)   Relevant Medications   atorvastatin (LIPITOR) 40 MG tablet   Paroxysmal atrial fibrillation (HCC)    Rate controlled with Metoprolol On Xarelto      Relevant Medications   atorvastatin (LIPITOR) 40 MG tablet     Respiratory   COPD (chronic obstructive pulmonary disease) (HCC)    Uses Albuterol inhaler PRN Can not afford Advair, would prefer only Albuterol inhaler Currently well-controlled      OSA on CPAP    Uses old CPAP for long time Needs to see Pulmonologist, referral provided      Relevant Orders   Ambulatory referral to Pulmonology   CBC with Differential     Endocrine   DM2 (diabetes mellitus, type 2) ([place])    HbA1C: 7.3 With peripheral neuropathy On Metformin currently Home blood glucose in AM ranges between 140-180 Will add Trajenta  5 mg QD SGLTi not covered by insurance Start Atorvastatin 40 mg QD      Relevant Medications   linagliptin (TRADJENTA) tablet 5 mg   atorvastatin (LIPITOR) 40 MG tablet   Other Relevant Orders    CMP14+EGFR   T4 AND TSH   Lipid Profile   HgB A1c     Other   Morbid obesity ([place])    Very limited activity Uses cane to ambulate Diet modification discussed at length, material provided to follow DASH diet      Relevant Medications   linagliptin (TRADJENTA) tablet 5 mg   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    In 2019 for AS Managed by Cardiologist      Relevant Orders   Ambulatory referral to Cardiology   S/P placement of cardiac pacemaker    For h/o complete heart block Follows up with Cardiologist            Anxiety    On Celexa and Buspar Refills provided      Relevant Medications   busPIRone (BUSPAR) 10 MG tablet   citalopram (CELEXA) 40 MG tablet    Other Visit Diagnoses    Need for immunization against influenza    -  Primary   Relevant Orders   Flu Vaccine QUAD 36+ mos IM (Completed)   Screening for diabetes mellitus       Relevant Orders   POCT glycosylated hemoglobin (Hb A1C) (Completed)   Chronic gout involving toe without tophus, unspecified cause, unspecified laterality       Relevant Medications   allopurinol (ZYLOPRIM) 300 MG tablet   Idiopathic peripheral neuropathy       Relevant Medications   gabapentin (NEURONTIN) 100 MG capsule   busPIRone (BUSPAR) 10 MG tablet   citalopram (CELEXA) 40 MG tablet      Outpatient Encounter Medications as of 02/10/2020  Medication Sig  . acetaminophen (TYLENOL) 325 MG tablet Take 650 mg by mouth every 6 (six) hours as needed for mild pain.  Marland Kitchen albuterol (PROVENTIL) (2.5 MG/3ML) 0.083% nebulizer solution INHALE 1 VIAL VIA NEBULIZER EVERY 6 HOURS AS NEEDED FOR WHEEZING OR SHORTNESS OF BREATH  . allopurinol (ZYLOPRIM) 300 MG tablet TAKE 1 Tablet BY MOUTH ONCE EVERY DAY  . busPIRone (BUSPAR) 10 MG tablet Take 1 tablet (10 mg total) by mouth 3 (three) times daily.  . citalopram (CELEXA) 40 MG tablet Take 1 tablet (40 mg total) by mouth daily.  Marland Kitchen  docusate sodium (COLACE) 100 MG capsule Take 200 mg by mouth daily as needed for mild constipation.   . furosemide (LASIX) 80 MG tablet Take 1 tablet (80 mg total) by mouth 2 (two) times daily.  Marland Kitchen gabapentin (NEURONTIN) 100 MG capsule Take 1 capsule (100 mg total) by mouth 2 (two) times daily as needed.  Javier Docker Oil 350 MG CAPS Take 1 capsule by mouth in the morning and at bedtime.   Marland Kitchen loratadine (CLARITIN) 10 MG tablet Take 10 mg by mouth daily as needed for allergies.  . metFORMIN (GLUCOPHAGE) 850 MG tablet Take 1 tablet (850 mg total) by mouth 2 (two) times daily with a meal.  . metolazone (ZAROXOLYN) 5 MG tablet Take 1 tablet (5 mg total) by mouth once a week.  . metoprolol tartrate (LOPRESSOR) 100 MG tablet Take 1 tablet (100 mg total) by mouth 2 (two) times daily.  . potassium chloride SA (KLOR-CON M20) 20 MEQ tablet Take 60 meq daily and take an  additional 20 meq on the day you take your weekly metolazone dose  . PROVENTIL HFA 108 (90 Base) MCG/ACT inhaler INHALE 2 PUFFS BY MOUTH EVERY 6 HOURS AS NEEDED FOR COUGHING, WHEEZING, OR SHORTNESS OF BREATH  . rivaroxaban (XARELTO) 20 MG TABS tablet TAKE 1 Tablet BY MOUTH ONCE EVERY DAY WITH SUPPER  . [DISCONTINUED] allopurinol (ZYLOPRIM) 300 MG tablet TAKE 1 Tablet BY MOUTH ONCE EVERY DAY  . [DISCONTINUED] busPIRone (BUSPAR) 10 MG tablet Take 10 mg by mouth 3 (three) times daily.   . [DISCONTINUED] citalopram (CELEXA) 40 MG tablet Take 40 mg by mouth daily.   . [DISCONTINUED] gabapentin (NEURONTIN) 100 MG capsule Take 1 capsule (100 mg total) by mouth 2 (two) times daily as needed.  Marland Kitchen atorvastatin (LIPITOR) 40 MG tablet Take 1 tablet (40 mg total) by mouth daily.  . [DISCONTINUED] Fluticasone-Salmeterol (WIXELA INHUB) 250-50 MCG/DOSE AEPB INHALE 1 PUFF BY MOUTH 2 TIMES DAILY. RINSE MOUTH AFTER USE. (Patient not taking: Reported on 02/10/2020)  . [DISCONTINUED] ibuprofen (ADVIL) 200 MG tablet Take 200 mg by mouth 2 (two) times daily as needed.  (Patient not taking: Reported on 02/10/2020)   Facility-Administered Encounter Medications as of 02/10/2020  Medication  . linagliptin (TRADJENTA) tablet 5 mg    Follow-up: Return in 4 months (on 06/11/2020).   [name], MD

## 2020-02-10 NOTE — Assessment & Plan Note (Signed)
Rate controlled with Metoprolol On Xarelto

## 2020-02-10 NOTE — Assessment & Plan Note (Signed)
HbA1C: 7.3 With peripheral neuropathy On Metformin currently Home blood glucose in AM ranges between 140-180 Will add Trajenta 5 mg QD SGLTi not covered by insurance Start Atorvastatin 40 mg QD

## 2020-02-10 NOTE — Assessment & Plan Note (Signed)
Care established History and medications reviewed

## 2020-02-10 NOTE — Assessment & Plan Note (Signed)
Very limited activity Uses cane to ambulate Diet modification discussed at length, material provided to follow DASH diet

## 2020-02-12 ENCOUNTER — Telehealth: Payer: Self-pay

## 2020-02-12 NOTE — Telephone Encounter (Addendum)
Contacted pt to provide weekly update to address any challenges or successes of BP and Weight checks and Personal Action Plan within the Remote Monitoring Hypertension Program.     Pt reported that he saw his new primary care provider at [facility] on 02/10/20 and was told that his Cardiologist will need  to manage his BP and meds. He states his next appt with the cardiologist is  on Apr 21, 2020.  States he continues to feel confident about use fo equipment and the protocol of the program.   States he continues his daily routine of BP management as documented within the personal action plan.    SUCCESSES    -Continues to able to get out of house 4-5 a week (walking into store and getting on mobile cart while in grocery store)       Lifting weights to help with activity level; attempting to clean out car and and other small tasks via out the week    (went to [employer] 3 times in one day,, flea market ( 2 days) and had to walk more at that time) -Continues to monitor BP and weight daily per protocol of the hypertension program -Continues to take medications daily as scheduled -Continues to not add salt to directly to food, to assist with reducing sodium intake   -  Utilizes weights for more of arm use while sitting alongside bed consistently   - Added oatmeal to diet 3-4 times day       BARRIERS -Continued limited types of exercise due to tiring quickly that comes along with his increased fluid levels -Forgetting to perform some nightly/evening BP check due to have a set routine of being used to checking BP in morning -Still not able to obtain pedometer or fit to keep up with steps of movement -Continues to have knee pain with certain types of exercise involving walking  -History  torn meniscus that exist with movement   -Fluctuating weight due to increased body fluid levels -Canned foods (select whatever is reasonable to afford)     Peronal Action Plan Goals for  weekl -Continue to Challenge self to move more at home and increase outings to at least 5-6 times a week (Will depend on energy level) -Continue to watch avoid sodium intake (do not add salt, cayenne, pepper flakes, black pepper)  -Continue to check BP and weight daily   -Alternate day of BP checks to include more PM/Evening Readings  - Continue to take meds as directed  - Work on weight management and fluctuating fluid levels and keeping down     RECOMMENDATIONS:      -Continue to help patient work towards his BP GOAL of 135/85     -Assist pt with working harder towards revised new Personal Action BP plan goals that were discussed above     -Pt was advised to contact his Cardiologist on this week to obtain permission for a log of BP readings and weights to be forwarded to      the Cardiologist office so that his BP regimen can be reviewed for any changes if needed.      -Attempt to perform some leg lifts versus knee bending exercises to prevent discomfort of knee when exercising    -Obtain a fit-bit/pedometer as soon as possible to assist patient with keeping up with steps/movement     -Remind patient to Perform more night/PM BP readings (alternate days or perform at least 2 times week)

## 2020-02-17 ENCOUNTER — Other Ambulatory Visit: Payer: Self-pay | Admitting: Internal Medicine

## 2020-02-17 ENCOUNTER — Telehealth: Payer: Self-pay

## 2020-02-17 DIAGNOSIS — J449 Chronic obstructive pulmonary disease, unspecified: Secondary | ICD-10-CM

## 2020-02-17 MED ORDER — ALBUTEROL SULFATE HFA 108 (90 BASE) MCG/ACT IN AERS: 2.0000 | INHALATION_SPRAY | Freq: Four times a day (QID) | RESPIRATORY_TRACT | 3 refills | Status: DC | PRN

## 2020-02-17 MED ORDER — TRELEGY ELLIPTA 100-62.5-25 MCG/INH IN AEPB: 1.0000 | INHALATION_SPRAY | Freq: Every day | RESPIRATORY_TRACT | 3 refills | Status: DC

## 2020-02-17 NOTE — Telephone Encounter (Signed)
Sent the rescue inhaler. Don't have generic for maintenance treatment. Sent Trelegy. Might need prior-auth.  Thank you.

## 2020-02-17 NOTE — Telephone Encounter (Signed)
Patient called and stated he needs you to prescribe him a rescue inhaler and a daily inhaler. He states he needs these to be generic whichever ones you decide to prescribe for him. He thought you had already wrote them, but the pharmacy doesn't have them.

## 2020-02-18 NOTE — Telephone Encounter (Signed)
Left message for patient with info and to call back if there are any problems.

## 2020-02-18 NOTE — Telephone Encounter (Signed)
Patient called back and stated he will start getting prescriptions through the mail order pharmacy

## 2020-02-19 NOTE — Congregational Nurse Program (Signed)
[employer] Remote monitoring program. 7 day and 30 day average blood pressure recordings.   7 day average of blood pressure recordings: 147/84 7 day highest blood pressure recording: 164/86 7 day lowest blood pressure recording: 127/82  30 day average of blood pressure recordings: 147/86 30 day highest blood pressure recording: 173/87 30 day lowest blood pressure recording: 116/76  Plan: RN will continue to monitor reported blood pressure readings to [employer] platform during scheduled office hours per guidelines and not any escalations or trends requiring intervention.  NOTE client is now insured with Medicare and will have a new primary care provider.   Lissa Hoard LPN will continue to reach out for support and health coaching and assist client in setting personal goals and action plans.  RN and Marnee Spring LPN will meet weekly to discuss client's progress and any potential barriers to client meeting goals and establish plan of action and solutions to any identified barriers.  Debria Garret RN

## 2020-02-23 ENCOUNTER — Other Ambulatory Visit: Payer: Self-pay | Admitting: Student

## 2020-02-23 ENCOUNTER — Other Ambulatory Visit: Payer: Self-pay | Admitting: *Deleted

## 2020-02-23 MED ORDER — METFORMIN HCL 850 MG PO TABS
850.0000 mg | ORAL_TABLET | Freq: Two times a day (BID) | ORAL | 0 refills | Status: DC
Start: 2020-02-23 — End: 2020-03-12

## 2020-02-23 NOTE — Telephone Encounter (Signed)
[facility]  [phone] Has several refills needed for Pt.  Thanks renee

## 2020-02-23 NOTE — Telephone Encounter (Signed)
Spoke with [facility] pharmacy pt needs Lasix, potassium, metolazone, Xarelto, and lopressor.

## 2020-02-24 ENCOUNTER — Other Ambulatory Visit: Payer: Self-pay

## 2020-02-24 MED ORDER — POTASSIUM CHLORIDE CRYS ER 20 MEQ PO TBCR: EXTENDED_RELEASE_TABLET | ORAL | 1 refills | Status: DC

## 2020-02-24 NOTE — Telephone Encounter (Signed)
Refilled potassium as requested

## 2020-02-25 ENCOUNTER — Ambulatory Visit (INDEPENDENT_AMBULATORY_CARE_PROVIDER_SITE_OTHER): Payer: Medicare HMO

## 2020-02-25 DIAGNOSIS — I442 Atrioventricular block, complete: Secondary | ICD-10-CM

## 2020-02-26 ENCOUNTER — Other Ambulatory Visit: Payer: Self-pay | Admitting: *Deleted

## 2020-02-26 LAB — CUP PACEART REMOTE DEVICE CHECK
Battery Remaining Longevity: 128 mo
Battery Remaining Percentage: 95.5 %
Battery Voltage: 3.02 V
Brady Statistic AP VP Percent: 1 %
Brady Statistic AP VS Percent: 5.9 %
Brady Statistic AS VP Percent: 1.9 %
Brady Statistic AS VS Percent: 92 %
Brady Statistic RA Percent Paced: 5.8 %
Brady Statistic RV Percent Paced: 1.9 %
Date Time Interrogation Session: 20211006020012
Implantable Lead Implant Date: 20190409
Implantable Lead Implant Date: 20190409
Implantable Lead Location: 753859
Implantable Lead Location: 753860
Implantable Pulse Generator Implant Date: 20190409
Lead Channel Impedance Value: 560 Ohm
Lead Channel Impedance Value: 590 Ohm
Lead Channel Pacing Threshold Amplitude: 0.5 V
Lead Channel Pacing Threshold Amplitude: 0.75 V
Lead Channel Pacing Threshold Pulse Width: 0.5 ms
Lead Channel Pacing Threshold Pulse Width: 0.5 ms
Lead Channel Sensing Intrinsic Amplitude: 4 mV
Lead Channel Sensing Intrinsic Amplitude: 5 mV
Lead Channel Setting Pacing Amplitude: 2 V
Lead Channel Setting Pacing Amplitude: 2.5 V
Lead Channel Setting Pacing Pulse Width: 0.5 ms
Lead Channel Setting Sensing Sensitivity: 2 mV
Pulse Gen Model: 2272
Pulse Gen Serial Number: 9010865

## 2020-02-26 MED ORDER — METOLAZONE 5 MG PO TABS: 5.0000 mg | ORAL_TABLET | ORAL | 3 refills | Status: DC

## 2020-02-26 MED ORDER — METOPROLOL TARTRATE 100 MG PO TABS: 100.0000 mg | ORAL_TABLET | Freq: Two times a day (BID) | ORAL | 3 refills | Status: DC

## 2020-02-26 MED ORDER — RIVAROXABAN 20 MG PO TABS: ORAL_TABLET | ORAL | 3 refills | Status: DC

## 2020-02-26 MED ORDER — FUROSEMIDE 80 MG PO TABS
80.0000 mg | ORAL_TABLET | Freq: Two times a day (BID) | ORAL | 3 refills | Status: DC
Start: 2020-02-26 — End: 2020-03-05

## 2020-02-26 MED ORDER — POTASSIUM CHLORIDE CRYS ER 20 MEQ PO TBCR: EXTENDED_RELEASE_TABLET | ORAL | 3 refills | Status: DC

## 2020-02-26 NOTE — Telephone Encounter (Signed)
New message    2nd call about refills   *STAT* If patient is at the pharmacy, call can be transferred to refill team.   1. Which medications need to be refilled? (please list name of each medication and dose if known)  metolazone (ZAROXOLYN) 5 MG tablet Take 1 tablet (5 mg total) by mouth once a week.    metoprolol tartrate (LOPRESSOR) 100 MG tablet Take 1 tablet (100 mg total) by mouth 2 (two) times daily.   potassium chloride SA (KLOR-CON M20) 20 MEQ tablet    furosemide (LASIX) 80 MG tablet rivaroxaban (XARELTO) 20 MG TABS tablet  2. Which pharmacy/location (including street and city if local pharmacy) is medication to be sent to? [place]   3. Do they need a 30 day or 90 day supply? [place]

## 2020-02-26 NOTE — Telephone Encounter (Signed)
Refills complete 

## 2020-02-27 ENCOUNTER — Other Ambulatory Visit: Payer: Self-pay

## 2020-02-27 DIAGNOSIS — F419 Anxiety disorder, unspecified: Secondary | ICD-10-CM

## 2020-02-27 DIAGNOSIS — G609 Hereditary and idiopathic neuropathy, unspecified: Secondary | ICD-10-CM

## 2020-02-27 DIAGNOSIS — E1159 Type 2 diabetes mellitus with other circulatory complications: Secondary | ICD-10-CM

## 2020-02-27 MED ORDER — BUSPIRONE HCL 10 MG PO TABS: 10.0000 mg | ORAL_TABLET | Freq: Three times a day (TID) | ORAL | 0 refills | Status: DC

## 2020-02-27 MED ORDER — ALBUTEROL SULFATE (2.5 MG/3ML) 0.083% IN NEBU: INHALATION_SOLUTION | RESPIRATORY_TRACT | 1 refills | Status: DC

## 2020-02-27 MED ORDER — GABAPENTIN 100 MG PO CAPS: 100.0000 mg | ORAL_CAPSULE | Freq: Two times a day (BID) | ORAL | 0 refills | Status: DC | PRN

## 2020-02-27 MED ORDER — ATORVASTATIN CALCIUM 40 MG PO TABS: 40.0000 mg | ORAL_TABLET | Freq: Every day | ORAL | 0 refills | Status: DC

## 2020-02-27 MED ORDER — CITALOPRAM HYDROBROMIDE 40 MG PO TABS: 40.0000 mg | ORAL_TABLET | Freq: Every day | ORAL | 0 refills | Status: DC

## 2020-03-01 NOTE — Progress Notes (Signed)
Remote pacemaker transmission.   

## 2020-03-02 NOTE — Congregational Nurse Program (Signed)
[facility] Remote patient monitoring program for hypertension. Client has recently transitioned to a new primary care provider due to receiving insurance. Client has continued to monitor blood pressure and weight remotely through program and has continued to receive health coaching from LPN.  Will provide 7 day average as well as 30 day average of blood pressure reading that have been uploaded to [employer] for provider's comparrison. Client has alerted new primary care provider Dr. Posey Pronto as well as his cardiology about his participation in this program.   Last 7 day average blood pressure from recorded readings: 151/87 Last 7 day highest blood pressure recorded: 162/88 Last 7 day lowest blood pressure recorded: 140/91  Last 30 day average blood pressure from Recorded readings: 151/88 Last 30 day highest blood pressure recorded: 169/90 Last 30 day lowest blood pressure recorded: 127/80  Plan: RN will continue to monitor daily recorded readings into [employer]ch al[employer] dashboard during normal office hours per program guideline. Client is also able to show providers his logs on his phone through the downloaded app as needed. If provider wishes to have copies of logs sent by fax client will let [facility] staff know.  LPN Lissa Hoard will continue to provide support and health coaching in regards to DASH diet, exercise and other possible lifestyle changes.  RN and LPN will continue to meet weekly and discuss client's progress and identify any potential barriers to meeting goals and develop action plan to address identified barriers using patient centered goals.   Debria Garret RN [employer]

## 2020-03-04 ENCOUNTER — Other Ambulatory Visit: Payer: Self-pay | Admitting: Internal Medicine

## 2020-03-04 DIAGNOSIS — F419 Anxiety disorder, unspecified: Secondary | ICD-10-CM

## 2020-03-05 ENCOUNTER — Other Ambulatory Visit: Payer: Self-pay

## 2020-03-05 ENCOUNTER — Other Ambulatory Visit: Payer: Self-pay | Admitting: Internal Medicine

## 2020-03-05 DIAGNOSIS — G609 Hereditary and idiopathic neuropathy, unspecified: Secondary | ICD-10-CM

## 2020-03-05 MED ORDER — FUROSEMIDE 80 MG PO TABS
80.0000 mg | ORAL_TABLET | Freq: Two times a day (BID) | ORAL | 3 refills | Status: DC
Start: 2020-03-05 — End: 2020-04-21

## 2020-03-05 NOTE — Telephone Encounter (Signed)
Refilled lasix 80 mg bid

## 2020-03-10 ENCOUNTER — Encounter: Payer: Self-pay | Admitting: Internal Medicine

## 2020-03-10 ENCOUNTER — Other Ambulatory Visit: Payer: Self-pay

## 2020-03-10 ENCOUNTER — Ambulatory Visit (INDEPENDENT_AMBULATORY_CARE_PROVIDER_SITE_OTHER): Payer: Medicare HMO | Admitting: Internal Medicine

## 2020-03-10 VITALS — BP 143/86 | HR 81 | Temp 97.8°F | Resp 22 | Ht 71.0 in | Wt >= 6400 oz

## 2020-03-10 DIAGNOSIS — G4733 Obstructive sleep apnea (adult) (pediatric): Secondary | ICD-10-CM

## 2020-03-10 DIAGNOSIS — M5416 Radiculopathy, lumbar region: Secondary | ICD-10-CM | POA: Diagnosis not present

## 2020-03-10 DIAGNOSIS — I10 Essential (primary) hypertension: Secondary | ICD-10-CM

## 2020-03-10 DIAGNOSIS — E1159 Type 2 diabetes mellitus with other circulatory complications: Secondary | ICD-10-CM | POA: Diagnosis not present

## 2020-03-10 DIAGNOSIS — R82998 Other abnormal findings in urine: Secondary | ICD-10-CM

## 2020-03-10 DIAGNOSIS — J449 Chronic obstructive pulmonary disease, unspecified: Secondary | ICD-10-CM | POA: Diagnosis not present

## 2020-03-10 LAB — POCT URINALYSIS DIP (CLINITEK)
Bilirubin, UA: NEGATIVE
Blood, UA: NEGATIVE
Glucose, UA: NEGATIVE mg/dL
Ketones, POC UA: NEGATIVE mg/dL
Leukocytes, UA: NEGATIVE
Nitrite, UA: NEGATIVE
POC PROTEIN,UA: NEGATIVE
Spec Grav, UA: 1.015 (ref 1.010–1.025)
Urobilinogen, UA: 0.2 E.U./dL
pH, UA: 5.5 (ref 5.0–8.0)

## 2020-03-10 MED ORDER — RYBELSUS 3 MG PO TABS: 1.0000 | ORAL_TABLET | Freq: Every day | ORAL | 1 refills | Status: DC

## 2020-03-10 MED ORDER — LINAGLIPTIN 5 MG PO TABS: 5.0000 mg | ORAL_TABLET | Freq: Every day | ORAL | 3 refills | Status: DC

## 2020-03-10 NOTE — Patient Instructions (Signed)
Please start taking Rybelsus and Tradjenta for diabetes. Rybelsus is also useful for weight loss.  You are being referred to Spine surgery for evaluation of back pain. Okay to take Tylenol PRN for pain. Avoid excessive back movement or lifting weight. Weight loss is very important for improvement in chronic back and knee pain.  Please follow diabetic diet for better control of blood glucose. Please decrease salt intake to 2 gm/day or less.  Please continue to take medications for blood pressure regularly.  We are trying to schedule your appointment with Pulmonology sooner for management for OSA.

## 2020-03-10 NOTE — Progress Notes (Signed)
Established Patient Office Visit  Subjective:  Patient ID: Reginald [name], male    DOB: [birth year]  Age: 49 y.o. MRN: [id number]  CC:  Chief Complaint  Patient presents with  . Acute Visit    pt having dull back pain pain on the left side and has a orange color and odor to urine x3 weeks his feet are also tingling     HPI Reginald [name] is a 49 year old male with past medical history of hypertension, aortic stenosis s/p aortic valve replacement and repair of ascending thoracic aortic aneurysm, paroxysmal atrial fibrillation, chronic CHF, h/o pacemaker (complete heart block), OSA on CPAP, COPD and depression presents for evaluation of back pain and noticing dark urine. Patient noticed dark urine for last 3 weeks, but denies dysuria, hematuria or urinary frequency. He denies fever, chills, nausea or vomiting.  Patient also c/o acute on chronic low back pain, radiating to the left buttock and leg. Patient has had multiple falls in the past, denies any recent fall or injury. Pain is sharp, constant, more on left side, 5-8/10, associated with tingling in the left foot. Patient denies difficulty urinating or passing stool, denies any change in sensation in perineal area.  Patient's BP is 143/86 in the office today. He states that he takes Metoprolol, Lasix and Metolazone regularly. He has follow up appointment with Cardiology in December. He denies dizziness, chest pain, or palpitations.  Patient has not had any call from Pulmonology for appointment for OSA management.  Will try to obtain sooner appointment.  Past Medical History:  Diagnosis Date  . Anxiety   . Aortic stenosis   . Arthritis   . Asthma   . Back pain   . Cellulitis of skin with lymphangitis   . CHF (congestive heart failure) ([place])   . Chronic diastolic congestive heart failure ([place])   . Chronic venous insufficiency   . Constipation   . COPD (chronic obstructive pulmonary disease) ([place])   . Depression   . Dyspnea   .  Essential hypertension   . Gout   . Heart murmur   . Hypertension   . Morbid obesity ([place])   . Obesity   . Pneumonia    walking pneumonia  . Pre-diabetes   . Prostatitis   . Pulmonary embolism ([place])   . Pulmonary hypertension ([place])   . Pyelonephritis   . S/P aortic valve replacement with bioprosthetic valve 08/23/2017   25 mm Edwards Inspiris Resilia stented bovine pericardial tissue valve  . 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 Sleep apnea    cpap   . Thoracic ascending aortic aneurysm ([place])   . Thoracic ascending aortic aneurysm ([place])   . Tobacco abuse     Past Surgical History:  Procedure Laterality Date  . AORTIC VALVE REPLACEMENT N/A 08/23/2017   Procedure: AORTIC VALVE REPLACEMENT (AVR) USING INSPIRIS RESILIA AORTIC VALVE SIZE 25 MM;  Surgeon: Rexene Alberts, MD;  Location: [facility];  Service: Open Heart Surgery;  Laterality: N/A;  . CARDIAC VALVE REPLACEMENT N/A    [employer]reesia 02/07/2020  . MULTIPLE EXTRACTIONS WITH ALVEOLOPLASTY N/A 07/23/2017   Procedure: Extraction of tooth #10 with alveoloplasty and gross debridement of remaining teeth;  Surgeon: Lenn Cal, DDS;  Location: [facility];  Service: Oral Surgery;  Laterality: N/A;  . NO PAST SURGERIES    . PACEMAKER IMPLANT N/A 08/28/2017    St Jude Medical Assurity MRI conditional  dual-chamber pacemaker  for symptomatic complete heart blockby Dr Rayann Heman  . TEE WITHOUT CARDIOVERSION N/A 07/16/2017   Procedure: TRANSESOPHAGEAL ECHOCARDIOGRAM (TEE);  Surgeon: Lelon Perla, MD;  [facility] ENDOSCOPY;  Service: Cardiovascular;  Laterality: N/A;  . TEE WITHOUT CARDIOVERSION N/A 08/23/2017   Procedure: TRANSESOPHAGEAL ECHOCARDIOGRAM (TEE);  Surgeon: Rexene Alberts, MD;  Location: [place];  Service: Open Heart Surgery;  Laterality: N/A;  . THORACIC AORTIC ANEURYSM REPAIR N/A 08/23/2017   Procedure: THORACIC ASCENDING ANEURYSM REPAIR (AAA) USING HEMASHIELD GOLD KNITTED MICROVEL  DOUBLE VELOUR VASCULAR GRAFT D: 24 MM  L: 30 CM;  Surgeon: Rexene Alberts, MD;  Location: [facility];  Service: Open Heart Surgery;  Laterality: N/A;    Family History  Problem Relation Age of Onset  . Hypertension Mother   . Alzheimer's disease Mother   . Heart attack Brother   . Diabetes Brother     Social History   Socioeconomic History  . Marital status: Divorced    Spouse name: Not on file  . Number of children: Not on file  . Years of education: Not on file  . Highest education level: Not on file  Occupational History  . Not on file  Tobacco Use  . Smoking status: Former Smoker    Packs/day: 2.00    Years: 16.00    Pack years: 32.00    Types: Cigarettes    Quit date: 08/23/2017    Years since quitting: 2.5  . Smokeless tobacco: Never Used  Vaping Use  . Vaping Use: Never used  Substance and Sexual Activity  . Alcohol use: No    Comment: have had alcohol in the past, not heavy  . Drug use: No  . Sexual activity: Not on file  Other Topics Concern  . Not on file  Social History Narrative  . Not on file   Social Determinants of Health   Financial Resource Strain:   . Difficulty of Paying Living Expenses: Not on file  Food Insecurity:   . Worried About Charity fundraiser in the Last Year: Not on file  . Ran Out of Food in the Last Year: Not on file  Transportation Needs:   . Lack of Transportation (Medical): Not on file  . Lack of Transportation (Non-Medical): Not on file  Physical Activity:   . Days of Exercise per Week: Not on file  . Minutes of Exercise per Session: Not on file  Stress:   . Feeling of Stress : Not on file  Social Connections:   . Frequency of Communication with Friends and Family: Not on file  . Frequency of Social Gatherings with Friends and Family: Not on file  . Attends Religious Services: Not on file  . Active Member of Clubs or Organizations: Not on file  . Attends Archivist Meetings: Not on file  . Marital Status: Not on  file  Intimate Partner Violence:   . Fear of Current or Ex-Partner: Not on file  . Emotionally Abused: Not on file  . Physically Abused: Not on file  . Sexually Abused: Not on file    Outpatient Medications Prior to Visit  Medication Sig Dispense Refill  . acetaminophen (TYLENOL) 325 MG tablet Take 650 mg by mouth every 6 (six) hours as needed for mild pain.    Marland Kitchen albuterol (PROVENTIL) (2.5 MG/3ML) 0.083% nebulizer solution Inhale 1 vial via nebulizer every 6 hours as needed for wheezing or shortness of breath 270 mL 1  . albuterol (VENTOLIN HFA) 108 (90  Base) MCG/ACT inhaler Inhale 2 puffs into the lungs every 6 (six) hours as needed for wheezing or shortness of breath. 8 g 3  . allopurinol (ZYLOPRIM) 300 MG tablet TAKE 1 Tablet BY MOUTH ONCE EVERY DAY 30 tablet 0  . atorvastatin (LIPITOR) 40 MG tablet Take 1 tablet (40 mg total) by mouth daily. 90 tablet 0  . busPIRone (BUSPAR) 10 MG tablet TAKE 1 TABLET THREE TIMES DAILY 270 tablet 0  . citalopram (CELEXA) 40 MG tablet TAKE 1 TABLET EVERY DAY 90 tablet 0  . docusate sodium (COLACE) 100 MG capsule Take 200 mg by mouth daily as needed for mild constipation.     . Fluticasone-Umeclidin-Vilant (TRELEGY ELLIPTA) 100-62.5-25 MCG/INH AEPB Inhale 1 Inhaler into the lungs daily. 1 each 3  . furosemide (LASIX) 80 MG tablet Take 1 tablet (80 mg total) by mouth 2 (two) times daily. 180 tablet 3  . gabapentin (NEURONTIN) 100 MG capsule TAKE 1 CAPSULE TWICE DAILY AS NEEDED 180 capsule 0  . Krill Oil 350 MG CAPS Take 1 capsule by mouth in the morning and at bedtime.     Marland Kitchen loratadine (CLARITIN) 10 MG tablet Take 10 mg by mouth daily as needed for allergies.    . metFORMIN (GLUCOPHAGE) 850 MG tablet Take 1 tablet (850 mg total) by mouth 2 (two) times daily with a meal. 60 tablet 0  . metolazone (ZAROXOLYN) 5 MG tablet Take 1 tablet (5 mg total) by mouth once a week. 12 tablet 3  . metoprolol tartrate (LOPRESSOR) 100 MG tablet Take 1 tablet (100 mg total)  by mouth 2 (two) times daily. 180 tablet 3  . potassium chloride SA (KLOR-CON M20) 20 MEQ tablet Take 60 meq daily and take an additional 20 meq on the day you take your weekly metolazone dose 94 tablet 3  . rivaroxaban (XARELTO) 20 MG TABS tablet TAKE 1 Tablet BY MOUTH ONCE EVERY DAY WITH SUPPER 90 tablet 3  . linagliptin (TRADJENTA) tablet 5 mg      No facility-administered medications prior to visit.    No Known Allergies  ROS Review of Systems  Constitutional: Negative for chills and fever.  HENT: Negative for congestion, sinus pressure, sinus pain and sore throat.   Eyes: Negative for pain and discharge.  Respiratory: Negative for cough and shortness of breath.   Cardiovascular: Negative for chest pain and palpitations.  Gastrointestinal: Negative for constipation, diarrhea, nausea and vomiting.  Endocrine: Negative for polydipsia and polyuria.  Genitourinary: Negative for dysuria and hematuria.       Dark urine  Musculoskeletal: Positive for back pain. Negative for neck pain and neck stiffness.  Skin: Negative for rash.  Neurological: Negative for dizziness, weakness and numbness.  Psychiatric/Behavioral: Negative for agitation and behavioral problems.      Objective:    Physical Exam Vitals reviewed.  Constitutional:      General: He is not in acute distress.    Appearance: He is obese. He is not diaphoretic.  HENT:     Head: Normocephalic and atraumatic.     Nose: Nose normal.     Mouth/Throat:     Mouth: Mucous membranes are moist.  Eyes:     General: No scleral icterus.    Extraocular Movements: Extraocular movements intact.     Pupils: Pupils are equal, round, and reactive to light.  Cardiovascular:     Rate and Rhythm: Normal rate and regular rhythm.     Heart sounds: Murmur (Systolic, most pronounced in right upper  sternal border) heard.   Pulmonary:     Breath sounds: Normal breath sounds. No wheezing or rales.  Abdominal:     Palpations: Abdomen is  soft.     Tenderness: There is no abdominal tenderness. There is no right CVA tenderness, left CVA tenderness, guarding or rebound.  Musculoskeletal:        General: Tenderness (Around L3-L5, no swelling or erythema) present.     Cervical back: Neck supple. No rigidity or tenderness.     Right lower leg: No edema.     Left lower leg: No edema.  Skin:    General: Skin is warm.     Findings: No rash.  Neurological:     General: No focal deficit present.     Mental Status: He is alert and oriented to person, place, and time.     Sensory: No sensory deficit.     Motor: No weakness.  Psychiatric:        Mood and Affect: Mood normal.        Behavior: Behavior normal.     BP (!) 143/86 (BP Location: Right Arm, Patient Position: Sitting)   Pulse 81   Temp 97.8 F (36.6 C) (Temporal)   Resp (!) 22   Ht 5\' 11"  (1.803 m)   Wt (!) 485 lb 6.4 oz (220.2 kg)   SpO2 98%   BMI 67.70 kg/m  Wt Readings from Last 3 Encounters:  03/10/20 (!) 485 lb 6.4 oz (220.2 kg)  02/10/20 (!) 475 lb 1.9 oz (215.5 kg)  01/23/20 (!) 475 lb 12.8 oz (215.8 kg)     Health Maintenance Due  Topic Date Due  . Hepatitis C Screening  Never done  . OPHTHALMOLOGY EXAM  Never done  . TETANUS/TDAP  Never done    There are no preventive care reminders to display for this patient.  Lab Results  Component Value Date   TSH 3.834 03/08/2019   Lab Results  Component Value Date   WBC 13.1 (H) 03/24/2019   HGB 13.1 03/24/2019   HCT 42.6 03/24/2019   MCV 83.5 03/24/2019   PLT 331 03/24/2019   Lab Results  Component Value Date   NA 134 (L) 01/01/2020   K 3.1 (L) 01/01/2020   CO2 26 01/01/2020   GLUCOSE 182 (H) 01/01/2020   BUN 20 01/01/2020   CREATININE 1.03 01/01/2020   BILITOT 1.0 01/01/2020   ALKPHOS 68 01/01/2020   AST 67 (H) 01/01/2020   ALT 52 (H) 01/01/2020   PROT 8.4 (H) 01/01/2020   ALBUMIN 3.6 01/01/2020   CALCIUM 9.0 01/01/2020   ANIONGAP 17 (H) 01/01/2020   Lab Results  Component Value  Date   CHOL 165 01/01/2020   Lab Results  Component Value Date   HDL 25 (L) 01/01/2020   Lab Results  Component Value Date   LDLCALC 67 01/01/2020   Lab Results  Component Value Date   TRIG 363 (H) 01/01/2020   Lab Results  Component Value Date   CHOLHDL 6.6 01/01/2020   Lab Results  Component Value Date   HGBA1C 7.3 (A) 02/10/2020   HGBA1C 7.3 02/10/2020   HGBA1C 7.3 (A) 02/10/2020   HGBA1C 7.3 (A) 02/10/2020      Assessment & Plan:   Problem List Items Addressed This Visit      Endocrine   DM2 (diabetes mellitus, type 2) (HCC) HbA1C: 7.3 On Metformin Added Tradjenta 5 mg QD Added Rybelsus (Mainly considering obesity) Advised to follow diabetic diet On statin  F/u CMP and lipid panel    Relevant Medications   linagliptin (TRADJENTA) 5 MG TABS tablet   Semaglutide (RYBELSUS) 3 MG TABS     Other   Morbid obesity (HCC) Diet modification advised, patient unable to perform exercise Continue ambulation as tolerated Rybelsus 3 mg QD, plan to increase slowly   Relevant Medications   linagliptin (TRADJENTA) 5 MG TABS tablet   Semaglutide (RYBELSUS) 3 MG TABS    Other Visit Diagnoses    Lumbar radiculopathy    -  Primary Left sided back pain radiating to left LE H/o falls in the past Tylenol PRN Not able to perform exercise Spine surgery referral for pain management/surgical evaluation   Relevant Orders   Ambulatory referral to Spine Surgery   Dark urine Likely related to dehydration in the setting of diuretic use Advised to maintain hydration Advised to get medical attention if he has fever, chills, dysuria or hematuria UA negative for LE and nitritie   Relevant Orders   POCT URINALYSIS DIP (CLINITEK) (Completed)   Primary hypertension Uncontrolled, BP: (!) 143/86 Could be uncontrolled due to OSA and obesity On Metoprolol, Lasix and Metolazone Follows up with Cardiology Would add ACEi/ARB after Cardiology evaluation and if BP is persistently high     OSA (obstructive sleep apnea) Uses CPAP Needs re-evaluation for CPAP by Pulmonology, will try to get sooner appointment      Meds ordered this encounter  Medications  . linagliptin (TRADJENTA) 5 MG TABS tablet    Sig: Take 1 tablet (5 mg total) by mouth daily.    Dispense:  90 tablet    Refill:  3  . Semaglutide (RYBELSUS) 3 MG TABS    Sig: Take 1 tablet by mouth daily.    Dispense:  30 tablet    Refill:  1    Follow-up: No follow-ups on file.    [name], MD

## 2020-03-11 ENCOUNTER — Encounter (INDEPENDENT_AMBULATORY_CARE_PROVIDER_SITE_OTHER): Payer: Self-pay | Admitting: *Deleted

## 2020-03-11 ENCOUNTER — Telehealth: Payer: Self-pay

## 2020-03-11 ENCOUNTER — Other Ambulatory Visit: Payer: Self-pay | Admitting: Internal Medicine

## 2020-03-11 ENCOUNTER — Telehealth (INDEPENDENT_AMBULATORY_CARE_PROVIDER_SITE_OTHER): Payer: Medicare HMO

## 2020-03-11 VITALS — BP 173/87 | HR 81 | Temp 97.8°F | Resp 22 | Ht 71.0 in | Wt >= 6400 oz

## 2020-03-11 DIAGNOSIS — Z1211 Encounter for screening for malignant neoplasm of colon: Secondary | ICD-10-CM

## 2020-03-11 DIAGNOSIS — Z0000 Encounter for general adult medical examination without abnormal findings: Secondary | ICD-10-CM | POA: Diagnosis not present

## 2020-03-11 NOTE — Telephone Encounter (Signed)
Pt informed and verbalizes understanding

## 2020-03-11 NOTE — Progress Notes (Addendum)
Subjective:   Reginald [name] is a 49 y.o. male who presents for Medicare Annual/Subsequent preventive examination.       Objective:    There were no vitals filed for this visit. There is no height or weight on file to calculate BMI.  Advanced Directives 06/08/2019 03/15/2019 03/08/2019 09/11/2018 05/06/2018 04/22/2018 04/22/2018  Does Patient Have a Medical Advance Directive? No No No No No No No  Would patient like information on creating a medical advance directive? - No - Patient declined No - Patient declined No - Patient declined No - Patient declined No - Patient declined No - Patient declined    Current Medications (verified) Outpatient Encounter Medications as of 03/11/2020  Medication Sig  . acetaminophen (TYLENOL) 325 MG tablet Take 650 mg by mouth every 6 (six) hours as needed for mild pain.  Marland Kitchen albuterol (PROVENTIL) (2.5 MG/3ML) 0.083% nebulizer solution Inhale 1 vial via nebulizer every 6 hours as needed for wheezing or shortness of breath  . albuterol (VENTOLIN HFA) 108 (90 Base) MCG/ACT inhaler Inhale 2 puffs into the lungs every 6 (six) hours as needed for wheezing or shortness of breath.  . allopurinol (ZYLOPRIM) 300 MG tablet TAKE 1 Tablet BY MOUTH ONCE EVERY DAY  . atorvastatin (LIPITOR) 40 MG tablet Take 1 tablet (40 mg total) by mouth daily.  . busPIRone (BUSPAR) 10 MG tablet TAKE 1 TABLET THREE TIMES DAILY  . citalopram (CELEXA) 40 MG tablet TAKE 1 TABLET EVERY DAY  . docusate sodium (COLACE) 100 MG capsule Take 200 mg by mouth daily as needed for mild constipation.   . Fluticasone-Umeclidin-Vilant (TRELEGY ELLIPTA) 100-62.5-25 MCG/INH AEPB Inhale 1 Inhaler into the lungs daily.  . furosemide (LASIX) 80 MG tablet Take 1 tablet (80 mg total) by mouth 2 (two) times daily.  Marland Kitchen gabapentin (NEURONTIN) 100 MG capsule TAKE 1 CAPSULE TWICE DAILY AS NEEDED  . Krill Oil 350 MG CAPS Take 1 capsule by mouth in the morning and at bedtime.   Marland Kitchen linagliptin (TRADJENTA) 5 MG TABS  tablet Take 1 tablet (5 mg total) by mouth daily.  Marland Kitchen loratadine (CLARITIN) 10 MG tablet Take 10 mg by mouth daily as needed for allergies.  . metFORMIN (GLUCOPHAGE) 850 MG tablet Take 1 tablet (850 mg total) by mouth 2 (two) times daily with a meal.  . metolazone (ZAROXOLYN) 5 MG tablet Take 1 tablet (5 mg total) by mouth once a week.  . metoprolol tartrate (LOPRESSOR) 100 MG tablet Take 1 tablet (100 mg total) by mouth 2 (two) times daily.  . potassium chloride SA (KLOR-CON M20) 20 MEQ tablet Take 60 meq daily and take an additional 20 meq on the day you take your weekly metolazone dose  . rivaroxaban (XARELTO) 20 MG TABS tablet TAKE 1 Tablet BY MOUTH ONCE EVERY DAY WITH SUPPER  . Semaglutide (RYBELSUS) 3 MG TABS Take 1 tablet by mouth daily.   No facility-administered encounter medications on file as of 03/11/2020.    Allergies (verified) Patient has no known allergies.   History: Past Medical History:  Diagnosis Date  . Anxiety   . Aortic stenosis   . Arthritis   . Asthma   . Back pain   . Cellulitis of skin with lymphangitis   . CHF (congestive heart failure) ([place])   . Chronic diastolic congestive heart failure ([place])   . Chronic venous insufficiency   . Constipation   . COPD (chronic obstructive pulmonary disease) ([place])   . Depression   . Dyspnea   .  Essential hypertension   . Gout   . Heart murmur   . Hypertension   . Morbid obesity ([place])   . Obesity   . Pneumonia    walking pneumonia  . Pre-diabetes   . Prostatitis   . Pulmonary embolism ([place])   . Pulmonary hypertension ([place])   . Pyelonephritis   . S/P aortic valve replacement with bioprosthetic valve 08/23/2017   25 mm Edwards Inspiris Resilia stented bovine pericardial tissue valve  . 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 Sleep apnea    cpap   . Thoracic ascending aortic aneurysm ([place])   . Thoracic ascending aortic aneurysm ([place])   . Tobacco abuse     Past Surgical History:  Procedure Laterality Date  . AORTIC VALVE REPLACEMENT N/A 08/23/2017   Procedure: AORTIC VALVE REPLACEMENT (AVR) USING INSPIRIS RESILIA AORTIC VALVE SIZE 25 MM;  Surgeon: Rexene Alberts, MD;  Location: [facility];  Service: Open Heart Surgery;  Laterality: N/A;  . CARDIAC VALVE REPLACEMENT N/A    Phreesia 02/07/2020  . MULTIPLE EXTRACTIONS WITH ALVEOLOPLASTY N/A 07/23/2017   Procedure: Extraction of tooth #10 with alveoloplasty and gross debridement of remaining teeth;  Surgeon: Lenn Cal, DDS;  Location: [facility];  Service: Oral Surgery;  Laterality: N/A;  . NO PAST SURGERIES    . PACEMAKER IMPLANT N/A 08/28/2017    St Jude Medical Assurity MRI conditional  dual-chamber pacemaker for symptomatic complete heart blockby Dr Rayann Heman  . TEE WITHOUT CARDIOVERSION N/A 07/16/2017   Procedure: TRANSESOPHAGEAL ECHOCARDIOGRAM (TEE);  Surgeon: Lelon Perla, MD;  Location[facility] ENDOSCOPY;  Service: Cardiovascular;  Laterality: N/A;  . TEE WITHOUT CARDIOVERSION N/A 08/23/2017   Procedure: TRANSESOPHAGEAL ECHOCARDIOGRAM (TEE);  Surgeon: Rexene Alberts, MD;  Location: [facility];  Service: Open Heart Surgery;  Laterality: N/A;  . THORACIC AORTIC ANEURYSM REPAIR N/A 08/23/2017   Procedure: THORACIC ASCENDING ANEURYSM REPAIR (AAA) USING HEMASHIELD GOLD KNITTED MICROVEL DOUBLE VELOUR VASCULAR GRAFT D: 24 MM  L: 30 CM;  Surgeon: Rexene Alberts, MD;  Location: [facility];  Service: Open Heart Surgery;  Laterality: N/A;   Family History  Problem Relation Age of Onset  . Hypertension Mother   . Alzheimer's disease Mother   . Heart attack Brother   . Diabetes Brother    Social History   Socioeconomic History  . Marital status: Divorced    Spouse name: Not on file  . Number of children: Not on file  . Years of education: Not on file  . Highest education level: Not on file  Occupational History  . Not on file  Tobacco Use  . Smoking status: Former Smoker    Packs/day: 2.00    Years:  16.00    Pack years: 32.00    Types: Cigarettes    Quit date: 08/23/2017    Years since quitting: 2.5  . Smokeless tobacco: Never Used  Vaping Use  . Vaping Use: Never used  Substance and Sexual Activity  . Alcohol use: No    Comment: have had alcohol in the past, not heavy  . Drug use: No  . Sexual activity: Not on file  Other Topics Concern  . Not on file  Social History Narrative  . Not on file   Social Determinants of Health   Financial Resource Strain:   . Difficulty of Paying Living Expenses: Not on file  Food Insecurity:   . Worried About Charity fundraiser in  the Last Year: Not on file  . Ran Out of Food in the Last Year: Not on file  Transportation Needs:   . Lack of Transportation (Medical): Not on file  . Lack of Transportation (Non-Medical): Not on file  Physical Activity:   . Days of Exercise per Week: Not on file  . Minutes of Exercise per Session: Not on file  Stress:   . Feeling of Stress : Not on file  Social Connections:   . Frequency of Communication with Friends and Family: Not on file  . Frequency of Social Gatherings with Friends and Family: Not on file  . Attends Religious Services: Not on file  . Active Member of Clubs or Organizations: Not on file  . Attends Archivist Meetings: Not on file  . Marital Status: Not on file    Tobacco Counseling Counseling given: Not Answered                  Diabetic? Yes          Activities of Daily Living In your present state of health, do you have any difficulty performing the following activities: 03/16/2019  Hearing? N  Vision? N  Difficulty concentrating or making decisions? N  Walking or climbing stairs? N  Dressing or bathing? N  Doing errands, shopping? N  Some recent data might be hidden    Patient Care Team: [name], MD as PCP - General (Internal Medicine) Thompson Grayer, MD as PCP - Electrophysiology (Cardiology)  Indicate any recent Medical Services you  may have received from other than [facility] providers in the past year (date may be approximate).     Assessment:   This is a routine wellness examination for Reginald [name].  Hearing/Vision screen No exam data present  Dietary issues and exercise activities discussed:    Goals   None    Depression Screen PHQ 2/9 Scores 03/10/2020 02/10/2020 02/11/2019 06/06/2018 11/14/2017  PHQ - 2 Score 0 2 6 6 6   PHQ- 9 Score - 17 16 16 13     Fall Risk Fall Risk  03/10/2020 02/10/2020 02/11/2019 06/06/2018 11/14/2017  Falls in the past year? 1 0 1 1 Yes  Number falls in past yr: 1 0 1 1 1   Injury with Fall? 0 0 0 0 No  Risk for fall due to : History of fall(s);Impaired balance/gait No Fall Risks Impaired balance/gait;Impaired mobility;History of fall(s) Impaired mobility;Impaired balance/gait;Other (Comment) -  Follow up Falls evaluation completed;Education provided;Falls prevention discussed Falls evaluation completed Falls prevention discussed Falls prevention discussed Falls evaluation completed    Any stairs in or around the home? No  If so, are there any without handrails? No  Home free of loose throw rugs in walkways, pet beds, electrical cords, etc? Yes  Adequate lighting in your home to reduce risk of falls? Yes   ASSISTIVE DEVICES UTILIZED TO PREVENT FALLS:  Life alert? No  Use of a cane, walker or w/c? Yes  Grab bars in the bathroom? No  Shower chair or bench in shower? No  Elevated toilet seat or a handicapped toilet? No   TIMED UP AND GO:  Was the test performed? No .    Cognitive Function:        Immunizations Immunization History  Administered Date(s) Administered  . Influenza Inj Mdck Quad Pf 02/27/2019  . Influenza,inj,Quad PF,6+ Mos 02/10/2020  . Janssen (J&J) SARS-COV-2 Vaccination 09/16/2019  . Pneumococcal Polysaccharide-23 03/17/2019    TDAP status: Due, Education has been provided  regarding the importance of this vaccine. Advised may receive this vaccine at local  pharmacy or Health Dept. Aware to provide a copy of the vaccination record if obtained from local pharmacy or Health Dept. Verbalized acceptance and understanding.   Flu Vaccine status: Up to date Pneumococcal vaccine status: Up to date Covid-19 vaccine status: Completed vaccines  Qualifies for Shingles Vaccine? Yes   Zostavax completed No   Shingrix Completed?: No.    Education has been provided regarding the importance of this vaccine. Patient has been advised to call insurance company to determine out of pocket expense if they have not yet received this vaccine. Advised may also receive vaccine at local pharmacy or Health Dept. Verbalized acceptance and understanding.  Screening Tests Health Maintenance  Topic Date Due  . Hepatitis C Screening  Never done  . OPHTHALMOLOGY EXAM  Never done  . TETANUS/TDAP  Never done  . FOOT EXAM  06/23/2020  . HEMOGLOBIN A1C  08/09/2020  . URINE MICROALBUMIN  09/16/2020  . INFLUENZA VACCINE  Completed  . PNEUMOCOCCAL POLYSACCHARIDE VACCINE AGE 18-64 HIGH RISK  Completed  . COVID-19 Vaccine  Completed  . HIV Screening  Completed    Health Maintenance  Health Maintenance Due  Topic Date Due  . Hepatitis C Screening  Never done  . OPHTHALMOLOGY EXAM  Never done  . TETANUS/TDAP  Never done    Colonoscopy: Wants to be scheduled for this.   Lung Cancer Screening: (Low Dose CT Chest recommended if Age 34-80 years, 30 pack-year currently smoking OR have quit w/in 15years.) does qualify.    Additional Screening:  Hepatitis C Screening: does qualify; needs blood work.   Vision Screening: Recommended annual ophthalmology exams for early detection of glaucoma and other disorders of the eye. Is the patient up to date with their annual eye exam?  Yes    Who is the provider or what is the name of the office in which the patient attends annual eye exams? [facility]   If pt is not established with a provider, would they like to be referred to  a provider to establish care? Yes .   Dental Screening: Recommended annual dental exams for proper oral hygiene  Community Resource Referral / Chronic Care Management: CRR required this visit?  No   CCM required this visit?  No      Plan:     I have personally reviewed and noted the following in the patient's chart:   . Medical and social history . Use of alcohol, tobacco or illicit drugs  . Current medications and supplements . Functional ability and status . Nutritional status . Physical activity . Advanced directives . List of other physicians . Hospitalizations, surgeries, and ER visits in previous 12 months . Vitals . Screenings to include cognitive, depression, and falls . Referrals and appointments  In addition, I have reviewed and discussed with patient certain preventive protocols, quality metrics, and best practice recommendations. A written personalized care plan for preventive services as well as general preventive health recommendations were provided to patient.     Lonn Georgia, LPN   [id number]   Nurse Notes: AWV was conducted over the phone with pt in the home, and provider here in the office.

## 2020-03-11 NOTE — Telephone Encounter (Signed)
Referral has been sent. Please advise him to expect a call from GI office for screening colonoscopy.

## 2020-03-12 ENCOUNTER — Other Ambulatory Visit: Payer: Self-pay

## 2020-03-12 DIAGNOSIS — M1A9XX0 Chronic gout, unspecified, without tophus (tophi): Secondary | ICD-10-CM

## 2020-03-12 DIAGNOSIS — J449 Chronic obstructive pulmonary disease, unspecified: Secondary | ICD-10-CM

## 2020-03-12 MED ORDER — ALLOPURINOL 300 MG PO TABS: ORAL_TABLET | ORAL | 1 refills | Status: DC

## 2020-03-12 MED ORDER — TRELEGY ELLIPTA 100-62.5-25 MCG/INH IN AEPB: 1.0000 | INHALATION_SPRAY | Freq: Every day | RESPIRATORY_TRACT | 1 refills | Status: AC

## 2020-03-12 MED ORDER — BLOOD GLUCOSE METER KIT: PACK | 0 refills | Status: DC

## 2020-03-12 MED ORDER — METFORMIN HCL 850 MG PO TABS
850.0000 mg | ORAL_TABLET | Freq: Two times a day (BID) | ORAL | 1 refills | Status: DC
Start: 2020-03-12 — End: 2020-03-17

## 2020-03-15 ENCOUNTER — Encounter: Payer: Self-pay | Admitting: Cardiology

## 2020-03-15 ENCOUNTER — Ambulatory Visit: Payer: Medicare HMO | Admitting: Cardiology

## 2020-03-15 ENCOUNTER — Other Ambulatory Visit: Payer: Self-pay

## 2020-03-15 VITALS — BP 140/88 | HR 71 | Ht 71.0 in | Wt >= 6400 oz

## 2020-03-15 DIAGNOSIS — R079 Chest pain, unspecified: Secondary | ICD-10-CM | POA: Diagnosis not present

## 2020-03-15 DIAGNOSIS — R0602 Shortness of breath: Secondary | ICD-10-CM

## 2020-03-15 DIAGNOSIS — G4733 Obstructive sleep apnea (adult) (pediatric): Secondary | ICD-10-CM | POA: Diagnosis not present

## 2020-03-15 DIAGNOSIS — Z9989 Dependence on other enabling machines and devices: Secondary | ICD-10-CM | POA: Diagnosis not present

## 2020-03-15 DIAGNOSIS — I872 Venous insufficiency (chronic) (peripheral): Secondary | ICD-10-CM

## 2020-03-15 DIAGNOSIS — I10 Essential (primary) hypertension: Secondary | ICD-10-CM | POA: Diagnosis not present

## 2020-03-15 DIAGNOSIS — I480 Paroxysmal atrial fibrillation: Secondary | ICD-10-CM | POA: Diagnosis not present

## 2020-03-15 DIAGNOSIS — Z953 Presence of xenogenic heart valve: Secondary | ICD-10-CM

## 2020-03-15 DIAGNOSIS — Z950 Presence of cardiac pacemaker: Secondary | ICD-10-CM

## 2020-03-15 MED ORDER — LISINOPRIL 10 MG PO TABS: 10.0000 mg | ORAL_TABLET | Freq: Every day | ORAL | 5 refills | Status: DC

## 2020-03-15 NOTE — Assessment & Plan Note (Signed)
I considered Amlodipine but would avoid Ca++ blocker with his history of chronic LE edema.

## 2020-03-15 NOTE — Assessment & Plan Note (Signed)
B/P running a little high- will add back Lisinopril but at a lower dose- 10mg .  Keep f/u with Ms Ahmed Prima as scheduled.

## 2020-03-15 NOTE — Patient Instructions (Addendum)
Medication Instructions:  Your physician has recommended you make the following change in your medication:   1) Start Lisinopril 10 mg, 1 tablet by mouth once a day  *If you need a refill on your cardiac medications before your next appointment, please call your pharmacy*  Lab Work: None ordered today  If you have labs (blood work) drawn today and your tests are completely normal, you will receive your results only by: Marland Kitchen MyChart Message (if you have MyChart) OR . A paper copy in the mail If you have any lab test that is abnormal or we need to change your treatment, we will call you to review the results.  Testing/Procedures: None ordered today  Follow-Up: Keep appointment on 04/21/20 at 2:00PM with Bernerd Pho, PA-C

## 2020-03-15 NOTE — Assessment & Plan Note (Signed)
In AF today- rate controlled, on Xarelto

## 2020-03-15 NOTE — Assessment & Plan Note (Signed)
25 mm Edwards Inspiris Resilia stented bovine pericardial tissue valve Last echo Oct 2020 Normal coronaries 2019

## 2020-03-15 NOTE — Progress Notes (Signed)
Cardiology Office Note:    Date:  03/15/2020   ID:  Reginald [name], DOB 09/20/1970, MRN [id number]  PCP:  Patel, Rutwik K, MD  Cardiologist:  No primary care provider on file.  Electrophysiologist:  James Allred, MD   Referring MD: Patel, Rutwik K, MD   No chief complaint on file. Sent by PCP secondary to elevated B/P.   History of Present Illness:    Reginald [name] is a pleasant 49 y.o. male with a hx of super morbid obesity- BMI 68-CAF on Xarelto, PTVDP April 2019 for CHB, s/p tissue AVR and aortic root repair April 2019, normal coronaries, OSA on C-pap, and NIDDM.  The patient had urosepsis Oct 2020.  After this he was seen in the ED with low B/P and his Lisinopril was stopped- he had been on Lisinopril 20 mg "for years".  Recently his PCP has noted a gradual increase in his B/P.  The patient has several readings from home showing his B/P to be 140-150 systolic average.   Past Medical History:  Diagnosis Date  . Anxiety   . Aortic stenosis   . Arthritis   . Asthma   . Back pain   . Cellulitis of skin with lymphangitis   . CHF (congestive heart failure) (HCC)   . Chronic diastolic congestive heart failure (HCC)   . Chronic venous insufficiency   . Constipation   . COPD (chronic obstructive pulmonary disease) (HCC)   . Depression   . Dyspnea   . Essential hypertension   . Gout   . Heart murmur   . Hypertension   . Morbid obesity (HCC)   . Obesity   . Pneumonia    walking pneumonia  . Pre-diabetes   . Prostatitis   . Pulmonary embolism (HCC)   . Pulmonary hypertension (HCC)   . Pyelonephritis   . S/P aortic valve replacement with bioprosthetic valve 08/23/2017   25 mm Edwards Inspiris Resilia stented bovine pericardial tissue valve  . 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 Sleep apnea    cpap   . Thoracic ascending aortic aneurysm (HCC)   . Thoracic ascending aortic aneurysm (HCC)   . Tobacco abuse     Past  Surgical History:  Procedure Laterality Date  . AORTIC VALVE REPLACEMENT N/A 08/23/2017   Procedure: AORTIC VALVE REPLACEMENT (AVR) USING INSPIRIS RESILIA AORTIC VALVE SIZE 25 MM;  Surgeon: Owen, Clarence H, MD;  Location: [facility] OR;  Service: Open Heart Surgery;  Laterality: N/A;  . CARDIAC VALVE REPLACEMENT N/A    Phreesia 02/07/2020  . MULTIPLE EXTRACTIONS WITH ALVEOLOPLASTY N/A 07/23/2017   Procedure: Extraction of tooth #10 with alveoloplasty and gross debridement of remaining teeth;  Surgeon: Kulinski, Ronald F, DDS;  Location: [facility] OR;  Service: Oral Surgery;  Laterality: N/A;  . NO PAST SURGERIES    . PACEMAKER IMPLANT N/A 08/28/2017    St Jude Medical Assurity MRI conditional  dual-chamber pacemaker for symptomatic complete heart blockby Dr Allred  . TEE WITHOUT CARDIOVERSION N/A 07/16/2017   Procedure: TRANSESOPHAGEAL ECHOCARDIOGRAM (TEE);  Surgeon: Crenshaw, Brian S, MD;  Location: [facility] ENDOSCOPY;  Service: Cardiovascular;  Laterality: N/A;  . TEE WITHOUT CARDIOVERSION N/A 08/23/2017   Procedure: TRANSESOPHAGEAL ECHOCARDIOGRAM (TEE);  Surgeon: Owen, Clarence H, MD;  Location: [facility] OR;  Service: Open Heart Surgery;  Laterality: N/A;  . THORACIC AORTIC ANEURYSM REPAIR N/A 08/23/2017   Procedure: THORACIC ASCENDING ANEURYSM REPAIR (AAA) USING HEMASHIELD GOLD KNITTED MICROVEL   DOUBLE VELOUR VASCULAR GRAFT D: 24 MM  L: 30 CM;  Surgeon: Owen, Clarence H, MD;  Location: [facility] OR;  Service: Open Heart Surgery;  Laterality: N/A;    Current Medications: Current Meds  Medication Sig  . acetaminophen (TYLENOL) 325 MG tablet Take 650 mg by mouth every 6 (six) hours as needed for mild pain.  . albuterol (PROVENTIL) (2.5 MG/3ML) 0.083% nebulizer solution Inhale 1 vial via nebulizer every 6 hours as needed for wheezing or shortness of breath  . albuterol (VENTOLIN HFA) 108 (90 Base) MCG/ACT inhaler Inhale 2 puffs into the lungs every 6 (six) hours as needed for wheezing or shortness of breath.  . allopurinol (ZYLOPRIM)  300 MG tablet TAKE 1 Tablet BY MOUTH ONCE EVERY DAY  . atorvastatin (LIPITOR) 40 MG tablet Take 1 tablet (40 mg total) by mouth daily.  . blood glucose meter kit and supplies Dispense based on patient and insurance preference. Once daily testing dx e11.9  . busPIRone (BUSPAR) 10 MG tablet TAKE 1 TABLET THREE TIMES DAILY  . citalopram (CELEXA) 40 MG tablet TAKE 1 TABLET EVERY DAY  . docusate sodium (COLACE) 100 MG capsule Take 200 mg by mouth daily as needed for mild constipation.   . Fluticasone-Umeclidin-Vilant (TRELEGY ELLIPTA) 100-62.5-25 MCG/INH AEPB Inhale 1 Inhaler into the lungs daily.  . furosemide (LASIX) 80 MG tablet Take 1 tablet (80 mg total) by mouth 2 (two) times daily.  . gabapentin (NEURONTIN) 100 MG capsule TAKE 1 CAPSULE TWICE DAILY AS NEEDED  . Krill Oil 350 MG CAPS Take 1 capsule by mouth in the morning and at bedtime.   . linagliptin (TRADJENTA) 5 MG TABS tablet Take 1 tablet (5 mg total) by mouth daily.  . loratadine (CLARITIN) 10 MG tablet Take 10 mg by mouth daily as needed for allergies.  . metFORMIN (GLUCOPHAGE) 850 MG tablet Take 1 tablet (850 mg total) by mouth 2 (two) times daily with a meal.  . metolazone (ZAROXOLYN) 5 MG tablet Take 1 tablet (5 mg total) by mouth once a week.  . metoprolol tartrate (LOPRESSOR) 100 MG tablet Take 1 tablet (100 mg total) by mouth 2 (two) times daily.  . potassium chloride SA (KLOR-CON M20) 20 MEQ tablet Take 60 meq daily and take an additional 20 meq on the day you take your weekly metolazone dose  . rivaroxaban (XARELTO) 20 MG TABS tablet TAKE 1 Tablet BY MOUTH ONCE EVERY DAY WITH SUPPER  . Semaglutide (RYBELSUS) 3 MG TABS Take 1 tablet by mouth daily.     Allergies:   Patient has no known allergies.   Social History   Socioeconomic History  . Marital status: Divorced    Spouse name: Not on file  . Number of children: Not on file  . Years of education: Not on file  . Highest education level: Not on file  Occupational  History  . Not on file  Tobacco Use  . Smoking status: Former Smoker    Packs/day: 2.00    Years: 16.00    Pack years: 32.00    Types: Cigarettes    Quit date: 08/23/2017    Years since quitting: 2.5  . Smokeless tobacco: Never Used  Vaping Use  . Vaping Use: Never used  Substance and Sexual Activity  . Alcohol use: No    Comment: have had alcohol in the past, not heavy  . Drug use: No  . Sexual activity: Not on file  Other Topics Concern  . Not on file  Social   History Narrative  . Not on file   Social Determinants of Health   Financial Resource Strain: Low Risk   . Difficulty of Paying Living Expenses: Not very hard  Food Insecurity: No Food Insecurity  . Worried About Running Out of Food in the Last Year: Never true  . Ran Out of Food in the Last Year: Never true  Transportation Needs: No Transportation Needs  . Lack of Transportation (Medical): No  . Lack of Transportation (Non-Medical): No  Physical Activity: Inactive  . Days of Exercise per Week: 0 days  . Minutes of Exercise per Session: 0 min  Stress: Stress Concern Present  . Feeling of Stress : Rather much  Social Connections: Socially Isolated  . Frequency of Communication with Friends and Family: More than three times a week  . Frequency of Social Gatherings with Friends and Family: Once a week  . Attends Religious Services: Never  . Active Member of Clubs or Organizations: No  . Attends Club or Organization Meetings: Never  . Marital Status: Divorced     Family History: [name] family history includes Alzheimer's disease in his mother; Diabetes in his brother; Heart attack in his brother; Hypertension in his mother.  ROS:   Please see the history of present illness.  Chronic LE edema    All other systems reviewed and are negative.  EKGs/Labs/Other Studies Reviewed:    The following studies were reviewed today: Echo 03/15/2020- IMPRESSIONS    1. Left ventricular ejection fraction, by  visual estimation, is 60 to  65%. The left ventricle has normal function. Normal left ventricular size.  There is no left ventricular hypertrophy.  2. Global right ventricle has mildly reduced systolic function.The right  ventricular size is not well visualized. No increase in right ventricular  wall thickness.  3. Left atrial size was not well visualized.  4. Right atrial size was not well visualized.  5. The mitral valve was not well visualized. No evidence of mitral valve  regurgitation. No evidence of mitral stenosis.  6. The tricuspid valve is not well visualized. Tricuspid valve  regurgitation is trivial.  7. Poorly visualized Bioprosthetic aortic valve valve is present in the  aortic position.  8. Pulmonic valve poorly visualized.  9. S/P Ascending thoraici aortic aneurysm repair. Aortic root and  ascending aorta poorly visualized.  10. Mildly elevated pulmonary artery systolic pressure.   EKG:  EKG is ordered today.  The ekg ordered today demonstrates AF with CVR- 71  Recent Labs: 03/17/2019: Magnesium 2.3 03/24/2019: Hemoglobin 13.1; Platelets 331 01/01/2020: ALT 52; BUN 20; Creatinine, Ser 1.03; Potassium 3.1; Sodium 134  Recent Lipid Panel    Component Value Date/Time   CHOL 165 01/01/2020 1014   TRIG 363 (H) 01/01/2020 1014   HDL 25 (L) 01/01/2020 1014   CHOLHDL 6.6 01/01/2020 1014   VLDL 73 (H) 01/01/2020 1014   LDLCALC 67 01/01/2020 1014    Physical Exam:    VS:  BP 140/88   Pulse 71   Ht 5' 11" (1.803 m)   Wt (!) 488 lb (221.4 kg)   SpO2 99%   BMI 68.06 kg/m     Wt Readings from Last 3 Encounters:  03/15/20 (!) 488 lb (221.4 kg)  03/11/20 (!) 485 lb (220 kg)  03/10/20 (!) 485 lb 6.4 oz (220.2 kg)     GEN: Morbidly obese Caucasian male in no acute distress-pt was asleep sitting in a chair when I came in HEENT: Normal NECK: No JVD; No   carotid bruits CARDIAC: irregularly irregular RRR, soft systolic murmur LSB, no rubs, gallops RESPIRATORY:   Clear to auscultation without rales, wheezing or rhonchi  ABDOMEN: Obese, non-distended MUSCULOSKELETAL:  No edema; No deformity  SKIN: Warm and dry NEUROLOGIC:  Alert and oriented x 3 PSYCHIATRIC:  Normal affect   ASSESSMENT:    Essential hypertension B/P running a little high- will add back Lisinopril but at a lower dose- 10mg.  Keep f/u with Ms Strader as scheduled.   Chronic venous insufficiency I considered Amlodipine but would avoid Ca++ blocker with his history of chronic LE edema.   OSA on CPAP Clearly severe sleep apnea secondary to obesity  Paroxysmal atrial fibrillation (HCC) In AF today- rate controlled, on Xarelto  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 25 mm Edwards Inspiris Resilia stented bovine pericardial tissue valve Last echo Oct 2020 Normal coronaries 2019  S/P placement of cardiac pacemaker April 2019  PLAN:    Add back Lisinopril- he has tolerated this well in the past and it controlled his B/P.  It was stopped Oct 2020 after he had urosepsis and became hypotensive.  Will use a lower dose -10mg- to start.  Goal B/P < 135/80.    Medication Adjustments/Labs and Tests Ordered: Current medicines are reviewed at length with the patient today.  Concerns regarding medicines are outlined above.  Orders Placed This Encounter  Procedures  . EKG 12-Lead   Meds ordered this encounter  Medications  . lisinopril (ZESTRIL) 10 MG tablet    Sig: Take 1 tablet (10 mg total) by mouth daily.    Dispense:  30 tablet    Refill:  5    Patient Instructions  Medication Instructions:  Your physician has recommended you make the following change in your medication:   1) Start Lisinopril 10 mg, 1 tablet by mouth once a day  *If you need a refill on your cardiac medications before your next appointment, please call your pharmacy*  Lab Work: None ordered today  If you have labs (blood work) drawn today and  your tests are completely normal, you will receive your results only by: . MyChart Message (if you have MyChart) OR . A paper copy in the mail If you have any lab test that is abnormal or we need to change your treatment, we will call you to review the results.  Testing/Procedures: None ordered today  Follow-Up: Keep appointment on 04/21/20 at 2:00PM with Brittany Strader, PA-C     Signed, Luke Kilroy, PA-C  03/15/2020 3:24 PM    [facility] 

## 2020-03-15 NOTE — Assessment & Plan Note (Signed)
April 2019

## 2020-03-15 NOTE — Assessment & Plan Note (Signed)
Clearly severe sleep apnea secondary to obesity

## 2020-03-17 ENCOUNTER — Other Ambulatory Visit: Payer: Self-pay | Admitting: Internal Medicine

## 2020-03-26 ENCOUNTER — Other Ambulatory Visit: Payer: Self-pay

## 2020-03-26 ENCOUNTER — Other Ambulatory Visit (HOSPITAL_COMMUNITY)
Admission: RE | Admit: 2020-03-26 | Discharge: 2020-03-26 | Disposition: A | Discharge status: Home / Self Care | Payer: Medicare HMO | Source: Ambulatory Visit | Attending: Student | Admitting: Student

## 2020-03-26 ENCOUNTER — Telehealth: Payer: Self-pay | Admitting: Student

## 2020-03-26 DIAGNOSIS — R252 Cramp and spasm: Secondary | ICD-10-CM | POA: Insufficient documentation

## 2020-03-26 LAB — BASIC METABOLIC PANEL
Anion gap: 13 (ref 5–15)
BUN: 20 mg/dL (ref 6–20)
CO2: 28 mmol/L (ref 22–32)
Calcium: 9.4 mg/dL (ref 8.9–10.3)
Chloride: 94 mmol/L — ABNORMAL LOW (ref 98–111)
Creatinine, Ser: 1.14 mg/dL (ref 0.61–1.24)
GFR, Estimated: >60 mL/min (ref >60)
Glucose, Bld: 175 mg/dL — ABNORMAL HIGH (ref 70–99)
Potassium: 3.7 mmol/L (ref 3.5–5.1)
Sodium: 135 mmol/L (ref 135–145)

## 2020-03-26 NOTE — Telephone Encounter (Signed)
     Looks like Lisinopril was restarted by Kerin Ransom, PA-C in 02/2020 but I cannot see where a BMET was obtained in the interim. Can order BMET.   Signed, Erma Heritage, PA-C 03/26/2020, 10:30 AM

## 2020-03-26 NOTE — Telephone Encounter (Signed)
BMET  Ordered, pt to stop by office for lab slip.

## 2020-03-26 NOTE — Telephone Encounter (Signed)
New message    Patient would like to have his potassium checked he is having a lot of cramping and thinks it is low

## 2020-03-26 NOTE — Telephone Encounter (Signed)
Patient states he took metolazone yesterday but was cramping before.He is taking potassium 60 meq daily, except for 80 meq on metolazone day.Cramps are over entire body.

## 2020-03-29 ENCOUNTER — Ambulatory Visit: Payer: Medicare HMO | Admitting: Family Medicine

## 2020-03-29 ENCOUNTER — Telehealth: Payer: Self-pay

## 2020-04-21 ENCOUNTER — Telehealth: Payer: Self-pay | Admitting: Student

## 2020-04-21 ENCOUNTER — Encounter: Payer: Self-pay | Admitting: Student

## 2020-04-21 ENCOUNTER — Other Ambulatory Visit: Payer: Self-pay

## 2020-04-21 ENCOUNTER — Ambulatory Visit: Payer: Medicare HMO | Admitting: Student

## 2020-04-21 VITALS — BP 132/60 | HR 86 | Ht 71.0 in | Wt >= 6400 oz

## 2020-04-21 DIAGNOSIS — Z953 Presence of xenogenic heart valve: Secondary | ICD-10-CM | POA: Diagnosis not present

## 2020-04-21 DIAGNOSIS — I5032 Chronic diastolic (congestive) heart failure: Secondary | ICD-10-CM | POA: Diagnosis not present

## 2020-04-21 DIAGNOSIS — I10 Essential (primary) hypertension: Secondary | ICD-10-CM | POA: Diagnosis not present

## 2020-04-21 DIAGNOSIS — Z950 Presence of cardiac pacemaker: Secondary | ICD-10-CM

## 2020-04-21 DIAGNOSIS — G4733 Obstructive sleep apnea (adult) (pediatric): Secondary | ICD-10-CM | POA: Diagnosis not present

## 2020-04-21 DIAGNOSIS — Z79899 Other long term (current) drug therapy: Secondary | ICD-10-CM | POA: Diagnosis not present

## 2020-04-21 DIAGNOSIS — I480 Paroxysmal atrial fibrillation: Secondary | ICD-10-CM

## 2020-04-21 MED ORDER — METOLAZONE 5 MG PO TABS: 5.0000 mg | ORAL_TABLET | ORAL | 3 refills | Status: DC

## 2020-04-21 MED ORDER — TORSEMIDE 20 MG PO TABS: 40.0000 mg | ORAL_TABLET | Freq: Two times a day (BID) | ORAL | 3 refills | Status: DC

## 2020-04-21 MED ORDER — LISINOPRIL 20 MG PO TABS: 20.0000 mg | ORAL_TABLET | Freq: Every day | ORAL | 3 refills | Status: DC

## 2020-04-21 NOTE — Progress Notes (Signed)
Cardiology Office Note    Date:  04/21/2020   ID:  Reginald [name], DOB [birth year], MRN [id number]  PCP:  [name], MD  Cardiologist: Previously followed by Dr. Bronson Ing EP: Dr. Rayann Heman  Chief Complaint  Patient presents with  . Follow-up    1 month visit    History of Present Illness:    Reginald [name] is a 49 y.o. male with past medical history of severeAS (s/p bovine tissue AVR in 08/2017 with repair of ascending thoracic aortic aneurysm),normalcoronary arteriesby cardiac catheterization in 2019, paroxysmal atrial fibrillation, CHB(s/p PPM placement in 08/2017), history of bilateral PE, HTN, morbid obesity and OSAwho presents to the office today for 51-monthfollow-up.   He was last examined by LKerin Ransom PA-C in 02/2020 after his PCP had reached out due to the patient having elevated BP. BP was at 140/88 during his visit and he was restarted on Lisinopril 15mdaily and no changes were made to his diuretic regimen. He did call the office last month reporting worsening cramping and a repeat BMET was obtained which showed K+ was stable at 3.7 and creatinine was at 1.14. He was informed to take an extra 20 mEq of K+ for 2 days then resume his normal dose.   In talking with the patient today, he reports his weight continues to be extremity variable at home. This typically increases to greater than 480 lbs on the weekends but decreases after he takes Metolazone. He says his weight can fluctuate by 10 to 15 pounds in a day after taking Metolazone. He does feel dizzy when this occurs. He reports his breathing has overall been stable and denies any chest pain or palpitations. No recent orthopnea, PND or lower extremity edema.   Past Medical History:  Diagnosis Date  . Anxiety   . Aortic stenosis   . Arthritis   . Asthma   . Back pain   . Cellulitis of skin with lymphangitis   . CHF (congestive heart failure) (HCWilson  . Chronic diastolic congestive heart failure (HCCol[place]  .  Chronic venous insufficiency   . Constipation   . COPD (chronic obstructive pulmonary disease) (HCDublin  . Depression   . Dyspnea   . Essential hypertension   . Gout   . Heart murmur   . Hypertension   . Morbid obesity (HCMineral  . Obesity   . Pneumonia    walking pneumonia  . Pre-diabetes   . Prostatitis   . Pulmonary embolism (HCHelena  . Pulmonary hypertension (HCHorton  . Pyelonephritis   . S/P aortic valve replacement with bioprosthetic valve 08/23/2017   25 mm Edwards Inspiris Resilia stented bovine pericardial tissue valve  . 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 Sleep apnea    cpap   . Thoracic ascending aortic aneurysm (HCColumbus AFB  . Thoracic ascending aortic aneurysm (HCSelma  . Tobacco abuse     Past Surgical History:  Procedure Laterality Date  . AORTIC VALVE REPLACEMENT N/A 08/23/2017   Procedure: AORTIC VALVE REPLACEMENT (AVR) USING INSPIRIS RESILIA AORTIC VALVE SIZE 25 MM;  Surgeon: OwRexene AlbertsMD;  [facility] Service: Open [facility] Surgery;  Laterality: N/A;  . CARDIAC VALVE REPLACEMENT N/A    Phreesia 02/07/2020  . MULTIPLE EXTRACTIONS WITH ALVEOLOPLASTY N/A 07/23/2017   Procedure: Extraction of tooth #10 with alveoloplasty and gross debridement of remaining teeth;  Surgeon: KuLenn Cal  DDS;  Location: [facility];  Service: Oral Surgery;  Laterality: N/A;  . NO PAST SURGERIES    . PACEMAKER IMPLANT N/A 08/28/2017    St Jude Medical Assurity MRI conditional  dual-chamber pacemaker for symptomatic complete heart blockby Dr Rayann Heman  . TEE WITHOUT CARDIOVERSION N/A 07/16/2017   Procedure: TRANSESOPHAGEAL ECHOCARDIOGRAM (TEE);  Surgeon: Lelon Perla, MD;  Location[facility] ENDOSCOPY;  Service: Cardiovascular;  Laterality: N/A;  . TEE WITHOUT CARDIOVERSION N/A 08/23/2017   Procedure: TRANSESOPHAGEAL ECHOCARDIOGRAM (TEE);  Surgeon: Rexene Alberts, MD;  Location: [facility];  Service: Open Heart Surgery;  Laterality: N/A;  .  THORACIC AORTIC ANEURYSM REPAIR N/A 08/23/2017   Procedure: THORACIC ASCENDING ANEURYSM REPAIR (AAA) USING HEMASHIELD GOLD KNITTED MICROVEL DOUBLE VELOUR VASCULAR GRAFT D: 24 MM  L: 30 CM;  Surgeon: Rexene Alberts, MD;  Location: [facility];  Service: Open Heart Surgery;  Laterality: N/A;    Current Medications: Outpatient Medications Prior to Visit  Medication Sig Dispense Refill  . acetaminophen (TYLENOL) 325 MG tablet Take 650 mg by mouth every 6 (six) hours as needed for mild pain.    Marland Kitchen albuterol (PROVENTIL) (2.5 MG/3ML) 0.083% nebulizer solution Inhale 1 vial via nebulizer every 6 hours as needed for wheezing or shortness of breath 270 mL 1  . albuterol (VENTOLIN HFA) 108 (90 Base) MCG/ACT inhaler Inhale 2 puffs into the lungs every 6 (six) hours as needed for wheezing or shortness of breath. 8 g 3  . allopurinol (ZYLOPRIM) 300 MG tablet TAKE 1 Tablet BY MOUTH ONCE EVERY DAY 30 tablet 1  . atorvastatin (LIPITOR) 40 MG tablet Take 1 tablet (40 mg total) by mouth daily. 90 tablet 0  . blood glucose meter kit and supplies Dispense based on patient and insurance preference. Once daily testing dx e11.9 1 each 0  . busPIRone (BUSPAR) 10 MG tablet TAKE 1 TABLET THREE TIMES DAILY 270 tablet 0  . citalopram (CELEXA) 40 MG tablet TAKE 1 TABLET EVERY DAY 90 tablet 0  . docusate sodium (COLACE) 100 MG capsule Take 200 mg by mouth daily as needed for mild constipation.     . furosemide (LASIX) 80 MG tablet Take 1 tablet (80 mg total) by mouth 2 (two) times daily. 180 tablet 3  . gabapentin (NEURONTIN) 100 MG capsule TAKE 1 CAPSULE TWICE DAILY AS NEEDED 180 capsule 0  . Krill Oil 350 MG CAPS Take 1 capsule by mouth in the morning and at bedtime.     Marland Kitchen linagliptin (TRADJENTA) 5 MG TABS tablet Take 1 tablet (5 mg total) by mouth daily. 90 tablet 3  . loratadine (CLARITIN) 10 MG tablet Take 10 mg by mouth daily as needed for allergies.    . metFORMIN (GLUCOPHAGE) 850 MG tablet TAKE 1 TABLET BY MOUTH TWICE DAILY  WITH A MEAL 60 tablet 0  . metoprolol tartrate (LOPRESSOR) 100 MG tablet Take 1 tablet (100 mg total) by mouth 2 (two) times daily. 180 tablet 3  . potassium chloride SA (KLOR-CON M20) 20 MEQ tablet Take 60 meq daily and take an additional 20 meq on the day you take your weekly metolazone dose 94 tablet 3  . rivaroxaban (XARELTO) 20 MG TABS tablet TAKE 1 Tablet BY MOUTH ONCE EVERY DAY WITH SUPPER 90 tablet 3  . Semaglutide (RYBELSUS) 3 MG TABS Take 1 tablet by mouth daily. 30 tablet 1  . lisinopril (ZESTRIL) 10 MG tablet Take 1 tablet (10 mg total) by mouth daily. 30 tablet 5  . metolazone (ZAROXOLYN)  5 MG tablet Take 1 tablet (5 mg total) by mouth once a week. 12 tablet 3   No facility-administered medications prior to visit.     Allergies:   Patient has no known allergies.   Social History   Socioeconomic History  . Marital status: Divorced    Spouse name: Not on file  . Number of children: Not on file  . Years of education: Not on file  . Highest education level: Not on file  Occupational History  . Not on file  Tobacco Use  . Smoking status: Former Smoker    Packs/day: 2.00    Years: 16.00    Pack years: 32.00    Types: Cigarettes    Quit date: 08/23/2017    Years since quitting: 2.6  . Smokeless tobacco: Never Used  Vaping Use  . Vaping Use: Never used  Substance and Sexual Activity  . Alcohol use: No    Comment: have had alcohol in the past, not heavy  . Drug use: No  . Sexual activity: Not on file  Other Topics Concern  . Not on file  Social History Narrative  . Not on file   Social Determinants of Health   Financial Resource Strain: Low Risk   . Difficulty of Paying Living Expenses: Not very hard  Food Insecurity: No Food Insecurity  . Worried About Charity fundraiser in the Last Year: Never true  . Ran Out of Food in the Last Year: Never true  Transportation Needs: No Transportation Needs  . Lack of Transportation (Medical): No  . Lack of Transportation  (Non-Medical): No  Physical Activity: Inactive  . Days of Exercise per Week: 0 days  . Minutes of Exercise per Session: 0 min  Stress: Stress Concern Present  . Feeling of Stress : Rather much  Social Connections: Socially Isolated  . Frequency of Communication with Friends and Family: More than three times a week  . Frequency of Social Gatherings with Friends and Family: Once a week  . Attends Religious Services: Never  . Active Member of Clubs or Organizations: No  . Attends Archivist Meetings: Never  . Marital Status: Divorced     Family History:  [name] family history includes Alzheimer's disease in his mother; Diabetes in his brother; Heart attack in his brother; Hypertension in his mother.   Review of Systems:   Please see the history of present illness.     General:  No chills, fever, night sweats. Positive for weight changes.  Cardiovascular:  No chest pain, edema, orthopnea, palpitations, paroxysmal nocturnal dyspnea. Positive for dyspnea on exertion.  Dermatological: No rash, lesions/masses Respiratory: No cough, dyspnea Urologic: No hematuria, dysuria Abdominal:   No nausea, vomiting, diarrhea, bright red blood per rectum, melena, or hematemesis Neurologic:  No visual changes, wkns, changes in mental status. All other systems reviewed and are otherwise negative except as noted above.   Physical Exam:    VS:  BP 132/60   Pulse 86   Ht '5\' 11"'  (1.803 m)   Wt (!) 469 lb (212.7 kg)   SpO2 97%   BMI 65.41 kg/m    General: Well developed, obese male appearing in no acute distress. Head: Normocephalic, atraumatic. Neck: No carotid bruits. JVD difficult to assess.  Lungs: Respirations regular and unlabored, without wheezes or rales.  Heart: Regular rate and rhythm. No S3 or S4. 2/6 SEM along RUSB.  Abdomen: Appears non-distended. No obvious abdominal masses. Msk:  Strength and tone appear  normal for age. No obvious joint deformities or  effusions. Extremities: No clubbing or cyanosis. Trace ankle edema bilaterally.  Distal pedal pulses are 2+ bilaterally. Neuro: Alert and oriented X 3. Moves all extremities spontaneously. No focal deficits noted. Psych:  Responds to questions appropriately with a normal affect. Skin: No rashes or lesions noted  Wt Readings from Last 3 Encounters:  04/21/20 (!) 469 lb (212.7 kg)  03/15/20 (!) 488 lb (221.4 kg)  03/11/20 (!) 485 lb (220 kg)     Studies/Labs Reviewed:   EKG:  EKG is not ordered today.    Recent Labs: 01/01/2020: ALT 52 03/26/2020: BUN 20; Creatinine, Ser 1.14; Potassium 3.7; Sodium 135   Lipid Panel    Component Value Date/Time   CHOL 165 01/01/2020 1014   TRIG 363 (H) 01/01/2020 1014   HDL 25 (L) 01/01/2020 1014   CHOLHDL 6.6 01/01/2020 1014   VLDL 73 (H) 01/01/2020 1014   LDLCALC 67 01/01/2020 1014    Additional studies/ records that were reviewed today include:   Cardiac Catheterization: 06/2017 1. No angiographic evidence of CAD 2. Severe aortic stenosis by TEE/TTE.  Due to turbulent flow in the ascending aorta and movement of the catheters, I could not cross the valve. The patient had a TEE this am that demonstrated severe AS. I did not feel that further attempts at crossing the valve would provide any clinical data that would change his treatment plan, though more attempts increased the risk of procedure complication. 3. Normal right heart pressures  Recommendations: Will continue planning for AVR vs TAVR. Pt to f/u with Dr. Roxy Manns after CT scans.    Echocardiogram: 02/2019 IMPRESSIONS    1. Left ventricular ejection fraction, by visual estimation, is 60 to  65%. The left ventricle has normal function. Normal left ventricular size.  There is no left ventricular hypertrophy.  2. Global right ventricle has mildly reduced systolic function.The right  ventricular size is not well visualized. No increase in right ventricular  wall thickness.  3. Left  atrial size was not well visualized.  4. Right atrial size was not well visualized.  5. The mitral valve was not well visualized. No evidence of mitral valve  regurgitation. No evidence of mitral stenosis.  6. The tricuspid valve is not well visualized. Tricuspid valve  regurgitation is trivial.  7. Poorly visualized Bioprosthetic aortic valve valve is present in the  aortic position.  8. Pulmonic valve poorly visualized.  9. S/P Ascending thoraici aortic aneurysm repair. Aortic root and  ascending aorta poorly visualized.  10. Mildly elevated pulmonary artery systolic pressure.   Assessment:    1. Chronic diastolic (congestive) heart failure ([place])   2. Medication management   3. 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   4. PAF (paroxysmal atrial fibrillation) (HCC)   5. Cardiac pacemaker in situ   6. Essential hypertension   7. OSA (obstructive sleep apnea)      Plan:   In order of problems listed above:  1. Chronic Diastolic CHF - He continues to have significant fluctuations in his fluid status since his weight can decrease by 10 to 15 pounds over a few days after he takes Metolazone. He has been on Lasix 80 mg twice daily as Torsemide was previously not covered by his insurance but he does have Medicare now and will check to see if this is covered. If it is, would recommend transitioning from Lasix 80 mg twice daily to Torsemide  40 mg twice daily initially which might require further titration. He continues to use Metolazone on an as-needed basis which varies from 1-3 times weekly.   2. Severe AS - He is s/p bovine tissue AVR in 08/2017 with repair of ascending thoracic aortic aneurysm. Echocardiogram in 02/2019 showed no acute changes as outlined above.  3. Paroxysmal Atrial Fibrillation - He is in normal sinus rhythm by examination today. Remains on Lopressor 100 mg twice daily for rate-control and Xarelto 20 mg  daily for anticoagulation.  4. CHB - He is s/p PPM placement in 08/2017 which is followed by Dr. Rayann Heman. Device check in 02/2020 showed normal device function.  5. HTN - BP is at 132/60 during today's visit but has been consistently elevated when checked at home with SBP typically in the 140's to 150's. He is currently on Lisinopril 10 mg daily and I recommended that we increase this to 20 mg daily. Recheck BMET in 2 to 3 weeks.  6. OSA - Continued compliance with CPAP encouraged.    Medication Adjustments/Labs and Tests Ordered: Current medicines are reviewed at length with the patient today.  Concerns regarding medicines are outlined above.  Medication changes, Labs and Tests ordered today are listed in the Patient Instructions below. Patient Instructions  Medication Instructions:  Your physician has recommended you make the following change in your medication:   Increase Lisinopril to 20 mg Daily   *If you need a refill on your cardiac medications before your next appointment, please call your pharmacy*   Lab Work: Your physician recommends that you return for lab work in: 2-3 Weeks ( 05/10/20)   If you have labs (blood work) drawn today and your tests are completely normal, you will receive your results only by: Marland Kitchen MyChart Message (if you have MyChart) OR . A paper copy in the mail If you have any lab test that is abnormal or we need to change your treatment, we will call you to review the results.   Testing/Procedures: NONE    Follow-Up: At [facility], you and your health needs are our priority.  As part of our continuing mission to provide you with exceptional heart care, we have created designated Provider Care Teams.  These Care Teams include your primary Cardiologist (physician) and Advanced Practice Providers (APPs -  Physician Assistants and Nurse Practitioners) who all work together to provide you with the care you need, when you need it.  We recommend signing up  for the patient portal called "MyChart".  Sign up information is provided on this After Visit Summary.  MyChart is used to connect with patients for Virtual Visits (Telemedicine).  Patients are able to view lab/test results, encounter notes, upcoming appointments, etc.  Non-urgent messages can be sent to your provider as well.   To learn more about what you can do with MyChart, go to [email or web address].    Your next appointment:   4 month(s)  The format for your next appointment:   In Person  Provider:   Bernerd Pho, PA-C   Other Instructions Thank you for choosing [facility]!       Signed, Erma Heritage, PA-C  04/21/2020 5:04 PM    [facility] [address] [place], [place] [address] Phone: [phone] Fax: [phone]

## 2020-04-21 NOTE — Patient Instructions (Signed)
Medication Instructions:  Your physician has recommended you make the following change in your medication:   Increase Lisinopril to 20 mg Daily   *If you need a refill on your cardiac medications before your next appointment, please call your pharmacy*   Lab Work: Your physician recommends that you return for lab work in: 2-3 Weeks ( 05/10/20)   If you have labs (blood work) drawn today and your tests are completely normal, you will receive your results only by:  [place] (if you have MyChart) OR  A paper copy in the mail If you have any lab test that is abnormal or we need to change your treatment, we will call you to review the results.   Testing/Procedures: NONE    Follow-Up: At [facility], you and your health needs are our priority.  As part of our continuing mission to provide you with exceptional heart care, we have created designated Provider Care Teams.  These Care Teams include your primary Cardiologist (physician) and Advanced Practice Providers (APPs -  Physician Assistants and Nurse Practitioners) who all work together to provide you with the care you need, when you need it.  We recommend signing up for the patient portal called "MyChart".  Sign up information is provided on this After Visit Summary.  MyChart is used to connect with patients for Virtual Visits (Telemedicine).  Patients are able to view lab/test results, encounter notes, upcoming appointments, etc.  Non-urgent messages can be sent to your provider as well.   To learn more about what you can do with MyChart, go to [email or web address].    Your next appointment:   4 month(s)  The format for your next appointment:   In Person  Provider:   Bernerd Pho, PA-C   Other Instructions Thank you for choosing [facility]!

## 2020-04-21 NOTE — Telephone Encounter (Signed)
Pt notified and orders placed  

## 2020-04-21 NOTE — Telephone Encounter (Signed)
    Please let the patient know that I received his message that Torsemide is now covered by his insurance.  I would recommend that we stop Lasix and switch to Torsemide 40 mg twice daily once it arrives from his pharmacy. Continue with as needed Metolazone. He should have a repeat BMET in 2-3 weeks. If weight increases significantly or he notices worsening symptoms, he should let us know as Torsemide may need to be further titrated but hopefully this will help him not have to utilize Metolazone as frequently.   Signed, Erma Heritage, PA-C 04/21/2020, 4:51 PM Pager: [phone]

## 2020-04-28 ENCOUNTER — Institutional Professional Consult (permissible substitution): Payer: Medicare HMO | Admitting: Pulmonary Disease

## 2020-05-10 ENCOUNTER — Encounter: Payer: Self-pay | Admitting: Pulmonary Disease

## 2020-05-10 ENCOUNTER — Other Ambulatory Visit: Payer: Self-pay

## 2020-05-10 ENCOUNTER — Other Ambulatory Visit: Payer: Self-pay | Admitting: Internal Medicine

## 2020-05-10 ENCOUNTER — Ambulatory Visit: Payer: Medicare HMO | Admitting: Pulmonary Disease

## 2020-05-10 DIAGNOSIS — G4733 Obstructive sleep apnea (adult) (pediatric): Secondary | ICD-10-CM

## 2020-05-10 DIAGNOSIS — Z9989 Dependence on other enabling machines and devices: Secondary | ICD-10-CM

## 2020-05-10 DIAGNOSIS — I5032 Chronic diastolic (congestive) heart failure: Secondary | ICD-10-CM | POA: Diagnosis not present

## 2020-05-10 NOTE — Assessment & Plan Note (Signed)
Continue torsemide 40 twice daily with extra dose of Zaroxolyn if he gains weight

## 2020-05-10 NOTE — Patient Instructions (Addendum)
Plan for split study at the hospital Based on this we will try and get you a new machine

## 2020-05-10 NOTE — Progress Notes (Signed)
   Subjective:    Patient ID: Reginald [name], male    DOB: [birth year], 49 y.o.   MRN: [id number]  HPI 49 year old obese man for follow-up of OSA and mild pulmonary hypertension  PMH - severeAS (s/p bovine tissue AVR in 08/2017 with repair of ascending thoracic aortic aneurysm),normalcoronary arteriesby cardiac catheterization in 2019, paroxysmal atrial fibrillation, CHB(s/p PPM placement in 08/2017, history of bilateral PE on Xarelto, HTN, morbid obesity   OSA has been diagnosed for more than 13 years and he has been sleeping with a borrowed machine.  He cannot sleep without the machine and is compliant.  On his last visit it seemed that he had a machine set at 10.5 cm, this time he has a different machine, REMstar which he had as a backup.  The filter appears dirty, machine has dust over it. He now has insurance and is interested in getting a new machine.  We do not have any sleep study on file.  He is compliant with his diuretics  Significant tests/ events reviewed Labs 06/18/2019, BUN/creatinine 25/0.9  PFTs 07/2017 mild restriction, no obstruction, ratio 77, FEV1 75%, FVC 76%, no bronchodilator response, TLC 88%, DLCO 74% but corrects for alveolar volume  Echo 02/2019 normal LVEF, decreased RV function, RVSP 39  Review of Systems neg for any significant sore throat, dysphagia, itching, sneezing, nasal congestion or excess/ purulent secretions, fever, chills, sweats, unintended wt loss, pleuritic or exertional cp, hempoptysis, orthopnea pnd or change in chronic leg swelling. Also denies presyncope, palpitations, heartburn, abdominal pain, nausea, vomiting, diarrhea or change in bowel or urinary habits, dysuria,hematuria, rash, arthralgias, visual complaints, headache, numbness weakness or ataxia.     Objective:   Physical Exam  Gen. Pleasant, obese, in no distress, disheveled ENT - no lesions, no post nasal drip Neck: No JVD, no thyromegaly, no carotid bruits Lungs: no use of  accessory muscles, no dullness to percussion, decreased without rales or rhonchi  Cardiovascular: Rhythm regular, heart sounds  normal, no murmurs or gallops, 2+  peripheral edema Musculoskeletal: No deformities, no cyanosis or clubbing , no tremors, ambulates with cane       Assessment & Plan:

## 2020-05-10 NOTE — Addendum Note (Signed)
Addended by: Merrilee Seashore on: 05/10/2020 10:41 AM   Modules accepted: Orders

## 2020-05-10 NOTE — Assessment & Plan Note (Signed)
Prescription for CPAP supplies will be sent to DME including a large Mirage fullface mask Now that he has insurance, we will proceed with an attended polysomnogram as a split study this will also enable [place] to review whether he needs oxygen during sleep, provide titration Based on this we can then proceed and write him a prescription for a new machine

## 2020-05-11 ENCOUNTER — Other Ambulatory Visit: Payer: Self-pay | Admitting: Internal Medicine

## 2020-05-11 DIAGNOSIS — G609 Hereditary and idiopathic neuropathy, unspecified: Secondary | ICD-10-CM

## 2020-05-13 ENCOUNTER — Other Ambulatory Visit: Payer: Self-pay | Admitting: Student

## 2020-05-14 ENCOUNTER — Other Ambulatory Visit: Payer: Self-pay | Admitting: Internal Medicine

## 2020-05-14 DIAGNOSIS — F419 Anxiety disorder, unspecified: Secondary | ICD-10-CM

## 2020-05-26 ENCOUNTER — Ambulatory Visit (INDEPENDENT_AMBULATORY_CARE_PROVIDER_SITE_OTHER): Payer: Medicare HMO

## 2020-05-26 DIAGNOSIS — I441 Atrioventricular block, second degree: Secondary | ICD-10-CM | POA: Diagnosis not present

## 2020-05-26 LAB — CUP PACEART REMOTE DEVICE CHECK
Battery Remaining Longevity: 128 mo
Battery Remaining Percentage: 95.5 %
Battery Voltage: 3.01 V
Brady Statistic AP VP Percent: 1 %
Brady Statistic AP VS Percent: 2.5 %
Brady Statistic AS VP Percent: 2 %
Brady Statistic AS VS Percent: 96 %
Brady Statistic RA Percent Paced: 2.4 %
Brady Statistic RV Percent Paced: 2 %
Date Time Interrogation Session: 20220105043152
Implantable Lead Implant Date: 20190409
Implantable Lead Implant Date: 20190409
Implantable Lead Location: 753859
Implantable Lead Location: 753860
Implantable Pulse Generator Implant Date: 20190409
Lead Channel Impedance Value: 590 Ohm
Lead Channel Impedance Value: 610 Ohm
Lead Channel Pacing Threshold Amplitude: 0.5 V
Lead Channel Pacing Threshold Amplitude: 0.75 V
Lead Channel Pacing Threshold Pulse Width: 0.5 ms
Lead Channel Pacing Threshold Pulse Width: 0.5 ms
Lead Channel Sensing Intrinsic Amplitude: 3.9 mV
Lead Channel Sensing Intrinsic Amplitude: 5 mV
Lead Channel Setting Pacing Amplitude: 2 V
Lead Channel Setting Pacing Amplitude: 2.5 V
Lead Channel Setting Pacing Pulse Width: 0.5 ms
Lead Channel Setting Sensing Sensitivity: 2 mV
Pulse Gen Model: 2272
Pulse Gen Serial Number: 9010865

## 2020-05-27 ENCOUNTER — Other Ambulatory Visit: Payer: Self-pay | Admitting: Internal Medicine

## 2020-05-31 ENCOUNTER — Other Ambulatory Visit: Payer: Self-pay

## 2020-05-31 ENCOUNTER — Ambulatory Visit: Payer: Medicare HMO | Attending: Pulmonary Disease | Admitting: Pulmonary Disease

## 2020-05-31 DIAGNOSIS — G4733 Obstructive sleep apnea (adult) (pediatric): Secondary | ICD-10-CM | POA: Diagnosis not present

## 2020-05-31 DIAGNOSIS — Z9989 Dependence on other enabling machines and devices: Secondary | ICD-10-CM | POA: Insufficient documentation

## 2020-06-01 ENCOUNTER — Telehealth: Payer: Self-pay

## 2020-06-01 NOTE — Telephone Encounter (Signed)
Attempted call just for follow up regarding welch allyn remote monitoring for hypertension. Client has transitioned from uninsured to insured. No answer left voicemail.  Debria Garret RN [employer]

## 2020-06-02 ENCOUNTER — Telehealth: Payer: Self-pay | Admitting: Pulmonary Disease

## 2020-06-02 DIAGNOSIS — G4733 Obstructive sleep apnea (adult) (pediatric): Secondary | ICD-10-CM

## 2020-06-02 DIAGNOSIS — Z9989 Dependence on other enabling machines and devices: Secondary | ICD-10-CM

## 2020-06-02 NOTE — Telephone Encounter (Signed)
NPSG  showed mod  OSA with AHI 30 / hr Suggest autoCPAP  5-15 cm, mask of choice OV with me @ [facility]  in 10 wks

## 2020-06-02 NOTE — Procedures (Signed)
Patient Name: Reginald [name], Reginald [name] Date: 05/31/2020 Gender: Male D.O.B: [birth year] Age (years): 20 Referring Provider: Kara Mead MD, ABSM Height (inches): 71 Interpreting Physician: Kara Mead MD, ABSM Weight (lbs): 479 RPSGT: Rosebud Poles BMI: 67 MRN: [id number] Neck Size: 23.50 <br> <br> CLINICAL INFORMATION Sleep Study Type: NPSG   Indication for sleep study:known OSA for reassessment  Epworth Sleepiness Score:9   SLEEP STUDY TECHNIQUE As per the AASM Manual for the Scoring of Sleep and Associated Events v2.3 (April 2016) with a hypopnea requiring 4% desaturations.  The channels recorded and monitored were frontal, central and occipital EEG, electrooculogram (EOG), submentalis EMG (chin), nasal and oral airflow, thoracic and abdominal wall motion, anterior tibialis EMG, snore microphone, electrocardiogram, and pulse oximetry.  MEDICATIONS Medications self-administered by patient taken the night of the study : N/A  SLEEP ARCHITECTURE The study was initiated at 10:13:28 PM and ended at 4:41:02 AM.  Sleep onset time was 33.7 minutes and the sleep efficiency was 46.1%. The total sleep time was 178.5 minutes.  Stage REM latency was N/A minutes.  The patient spent 23.53% of the night in stage N1 sleep, 76.47% in stage N2 sleep, 0.00% in stage N3 and 0% in REM.  Alpha intrusion was absent.  Supine sleep was 0.00%.  RESPIRATORY PARAMETERS The overall apnea/hypopnea index (AHI) was 29.9 per hour. There were 20 total apneas, including 20 obstructive, 0 central and 0 mixed apneas. There were 69 hypopneas and 0 RERAs.  The AHI during Stage REM sleep was N/A per hour.  AHI while supine was N/A per hour.  The mean oxygen saturation was 92.22%. The minimum SpO2 during sleep was 81.00%.  moderate snoring was noted during this study.  CARDIAC DATA The 2 lead EKG demonstrated sinus rhythm. The mean heart rate was 70.90 beats per minute. Other EKG findings include: None. LEG  MOVEMENT DATA The total PLMS were 0 with a resulting PLMS index of 0.00. Associated arousal with leg movement index was 0.0 .  IMPRESSIONS - Moderate obstructive sleep apnea occurred during this study (AHI = 29.9/h). - No significant central sleep apnea occurred during this study (CAI = 0.0/h). - Mild oxygen desaturation was noted during this study (Min O2 = 81.00%). - The patient snored with moderate snoring volume. - No cardiac abnormalities were noted during this study. - Clinically significant periodic limb movements did not occur during sleep. No significant associated arousals.  DIAGNOSIS - Obstructive Sleep Apnea (G47.33)  RECOMMENDATIONS - Therapeutic CPAP titration to determine optimal pressure required to alleviate sleep disordered breathing. Alternatively autoCPAP can be tried - Avoid alcohol, sedatives and other CNS depressants that may worsen sleep apnea and disrupt normal sleep architecture. - Sleep hygiene should be reviewed to assess factors that may improve sleep quality. - Weight management and regular exercise should be initiated or continued if appropriate.   Kara Mead MD Board Certified in [place]

## 2020-06-03 NOTE — Telephone Encounter (Signed)
Spoke with patient to let him know results of sleep study. Patient stated that he would like CPAP and to use [place] in [place]. Order has been placed. Advised patient that there is a delay in getting machines and for him to call the office once he physically gets the machine to schedule follow up 31-90 days after he gets machine. Patient expressed understanding. Nothing further needed at this time.

## 2020-06-03 NOTE — Telephone Encounter (Signed)
ATC patient to go over results per DPR left detailed message and advised patient to call back if he is interested in starting CPAP. Will wait to hear from patient to place order.

## 2020-06-04 ENCOUNTER — Other Ambulatory Visit (HOSPITAL_COMMUNITY)
Admission: RE | Admit: 2020-06-04 | Discharge: 2020-06-04 | Disposition: A | Discharge status: Home / Self Care | Payer: Medicare HMO | Source: Ambulatory Visit | Attending: Student | Admitting: Student

## 2020-06-04 ENCOUNTER — Other Ambulatory Visit: Payer: Self-pay | Admitting: Internal Medicine

## 2020-06-04 ENCOUNTER — Telehealth: Payer: Self-pay

## 2020-06-04 DIAGNOSIS — I10 Essential (primary) hypertension: Secondary | ICD-10-CM

## 2020-06-04 DIAGNOSIS — M1A9XX0 Chronic gout, unspecified, without tophus (tophi): Secondary | ICD-10-CM

## 2020-06-04 DIAGNOSIS — Z79899 Other long term (current) drug therapy: Secondary | ICD-10-CM | POA: Insufficient documentation

## 2020-06-04 DIAGNOSIS — E1159 Type 2 diabetes mellitus with other circulatory complications: Secondary | ICD-10-CM

## 2020-06-04 DIAGNOSIS — G4733 Obstructive sleep apnea (adult) (pediatric): Secondary | ICD-10-CM

## 2020-06-04 LAB — BASIC METABOLIC PANEL
Anion gap: 11 (ref 5–15)
BUN: 29 mg/dL — ABNORMAL HIGH (ref 6–20)
CO2: 29 mmol/L (ref 22–32)
Calcium: 8.8 mg/dL — ABNORMAL LOW (ref 8.9–10.3)
Chloride: 95 mmol/L — ABNORMAL LOW (ref 98–111)
Creatinine, Ser: 1.27 mg/dL — ABNORMAL HIGH (ref 0.61–1.24)
GFR, Estimated: >60 mL/min (ref >60)
Glucose, Bld: 316 mg/dL — ABNORMAL HIGH (ref 70–99)
Potassium: 4.4 mmol/L (ref 3.5–5.1)
Sodium: 135 mmol/L (ref 135–145)

## 2020-06-04 NOTE — Telephone Encounter (Signed)
Patient canceled his appt for Monday until he can get labs done. Patient came in today to get labs, no labs were ordered. Please contact patient.

## 2020-06-04 NOTE — Telephone Encounter (Signed)
Please advise which labs you would like placed for pt

## 2020-06-04 NOTE — Telephone Encounter (Signed)
I had planned to get blood tests on the same of the visit. Regardless, I have ordered blood tests.

## 2020-06-07 ENCOUNTER — Telehealth: Payer: Medicare HMO | Admitting: Internal Medicine

## 2020-06-08 ENCOUNTER — Telehealth: Payer: Self-pay | Admitting: *Deleted

## 2020-06-08 DIAGNOSIS — Z79899 Other long term (current) drug therapy: Secondary | ICD-10-CM

## 2020-06-08 NOTE — Telephone Encounter (Signed)
-----   Message from Erma Heritage, [place] sent at 06/08/2020 10:35 AM EST ----- Thank you for the update. I would recommend [place] or his PCP recheck a BMET in 3-4 weeks for reassessment.

## 2020-06-08 NOTE — Telephone Encounter (Signed)
Notified pt of the need for lab work in 3-4 weeks. Pt voiced understanding. Order placed.

## 2020-06-09 NOTE — Progress Notes (Signed)
Remote pacemaker transmission.   

## 2020-06-10 ENCOUNTER — Other Ambulatory Visit: Payer: Self-pay

## 2020-06-10 ENCOUNTER — Encounter: Payer: Self-pay | Admitting: Internal Medicine

## 2020-06-10 ENCOUNTER — Ambulatory Visit (INDEPENDENT_AMBULATORY_CARE_PROVIDER_SITE_OTHER): Payer: Medicare HMO | Admitting: Internal Medicine

## 2020-06-10 VITALS — BP 118/76 | HR 84 | Temp 97.8°F | Ht 71.0 in | Wt >= 6400 oz

## 2020-06-10 DIAGNOSIS — I480 Paroxysmal atrial fibrillation: Secondary | ICD-10-CM | POA: Diagnosis not present

## 2020-06-10 DIAGNOSIS — Z135 Encounter for screening for eye and ear disorders: Secondary | ICD-10-CM | POA: Diagnosis not present

## 2020-06-10 DIAGNOSIS — I10 Essential (primary) hypertension: Secondary | ICD-10-CM

## 2020-06-10 DIAGNOSIS — Z9989 Dependence on other enabling machines and devices: Secondary | ICD-10-CM

## 2020-06-10 DIAGNOSIS — E1159 Type 2 diabetes mellitus with other circulatory complications: Secondary | ICD-10-CM | POA: Diagnosis not present

## 2020-06-10 DIAGNOSIS — J449 Chronic obstructive pulmonary disease, unspecified: Secondary | ICD-10-CM | POA: Diagnosis not present

## 2020-06-10 DIAGNOSIS — G4733 Obstructive sleep apnea (adult) (pediatric): Secondary | ICD-10-CM

## 2020-06-10 MED ORDER — EMPAGLIFLOZIN 25 MG PO TABS
25.0000 mg | ORAL_TABLET | Freq: Every day | ORAL | 1 refills | Status: DC
Start: 2020-06-10 — End: 2020-06-14

## 2020-06-10 MED ORDER — RYBELSUS 3 MG PO TABS: 1.0000 | ORAL_TABLET | Freq: Every day | ORAL | 1 refills | Status: DC

## 2020-06-10 MED ORDER — ALBUTEROL SULFATE HFA 108 (90 BASE) MCG/ACT IN AERS: 2.0000 | INHALATION_SPRAY | Freq: Four times a day (QID) | RESPIRATORY_TRACT | 3 refills | Status: DC | PRN

## 2020-06-10 NOTE — Patient Instructions (Signed)
Please continue taking medications as prescribed.  Once you are done with Tradjenta, please switch to Jardiance. Please start taking Rybelsus once you receive it.  Please contact us if medications are not covered by your insurance.  Please continue to use Albuterol inhaler for shortness of breath/wheezing. If you need to use it more than twice in a day, please contact us.  Please start using CPAP once you receive it and contact your Pulmonologist to set up appointment.

## 2020-06-10 NOTE — Progress Notes (Signed)
Established Patient Office Visit  Subjective:  Patient ID: Reginald [name], male    DOB: 25-Apr-1971  Age: 50 y.o. MRN: [id number]  CC:  Chief Complaint  Patient presents with  . Follow-up    HPI SHUN PLETZ  is a 50 year old male with past medical history of hypertension, aortic stenosis s/p aortic valve replacement and repair of ascending thoracic aortic aneurysm, paroxysmal atrial fibrillation, chronic CHF, h/o pacemaker (complete heart block), OSA on CPAP, COPD and depression who presents for follow up of his chronic medical conditions.  BP is well-controlled. Takes medications regularly. Patient denies headache, dizziness, chest pain, dyspnea or palpitations.  He has been taking metformin and Januvia. He could not get Rybelsus as his pharmacy could not clarify the instructions on the prescription, which is recent today. He checks his blood glucose every day, which has been around 150.  He had sleep study recently. He is to get a new CPAP device.  He takes albuterol inhaler as needed for COPD. He does not require it frequently.  Past Medical History:  Diagnosis Date  . Anxiety   . Aortic stenosis   . Arthritis   . Asthma   . Back pain   . Cellulitis of skin with lymphangitis   . CHF (congestive heart failure) ([place])   . Chronic diastolic congestive heart failure ([place])   . Chronic venous insufficiency   . Constipation   . COPD (chronic obstructive pulmonary disease) ([place])   . Depression   . Dyspnea   . Essential hypertension   . Gout   . Heart murmur   . Hypertension   . Morbid obesity ([place])   . Obesity   . Pneumonia    walking pneumonia  . Pre-diabetes   . Prostatitis   . Pulmonary embolism ([place])   . Pulmonary hypertension ([place])   . Pyelonephritis   . S/P aortic valve replacement with bioprosthetic valve 08/23/2017   25 mm Edwards Inspiris Resilia stented bovine pericardial tissue valve  . 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   24 mm Hemashield supracoronary  straight graft   . Sleep apnea    cpap   . Thoracic ascending aortic aneurysm ([place])   . Thoracic ascending aortic aneurysm ([place])   . Tobacco abuse     Past Surgical History:  Procedure Laterality Date  . AORTIC VALVE REPLACEMENT N/A 08/23/2017   Procedure: AORTIC VALVE REPLACEMENT (AVR) USING INSPIRIS RESILIA AORTIC VALVE SIZE 25 MM;  Surgeon: Rexene Alberts, MD;  Location: [place];  Service: Open Heart Surgery;  Laterality: N/A;  . CARDIAC VALVE REPLACEMENT N/A    Phreesia 02/07/2020  . MULTIPLE EXTRACTIONS WITH ALVEOLOPLASTY N/A 07/23/2017   Procedure: Extraction of tooth #10 with alveoloplasty and gross debridement of remaining teeth;  Surgeon: Lenn Cal, DDS;  Location: [place];  Service: Oral Surgery;  Laterality: N/A;  . NO PAST SURGERIES    . PACEMAKER IMPLANT N/A 08/28/2017    St Jude Medical Assurity MRI conditional  dual-chamber pacemaker for symptomatic complete heart blockby Dr Rayann Heman  . TEE WITHOUT CARDIOVERSION N/A 07/16/2017   Procedure: TRANSESOPHAGEAL ECHOCARDIOGRAM (TEE);  Surgeon: Lelon Perla, MD;  Location[facility] ENDOSCOPY;  Service: Cardiovascular;  Laterality: N/A;  . TEE WITHOUT CARDIOVERSION N/A 08/23/2017   Procedure: TRANSESOPHAGEAL ECHOCARDIOGRAM (TEE);  Surgeon: Rexene Alberts, MD;  Location: [place];  Service: Open Heart Surgery;  Laterality: N/A;  . THORACIC AORTIC ANEURYSM REPAIR N/A 08/23/2017   Procedure: THORACIC ASCENDING ANEURYSM REPAIR (AAA) USING HEMASHIELD  GOLD KNITTED MICROVEL DOUBLE VELOUR VASCULAR GRAFT D: 24 MM  L: 30 CM;  Surgeon: Rexene Alberts, MD;  Location: [place];  Service: Open Heart Surgery;  Laterality: N/A;    Family History  Problem Relation Age of Onset  . Hypertension Mother   . Alzheimer's disease Mother   . Heart attack Brother   . Diabetes Brother     Social History   Socioeconomic History  . Marital status: Divorced    Spouse name: Not on file  . Number of children: Not on file  . Years of education: Not on file  .  Highest education level: Not on file  Occupational History  . Not on file  Tobacco Use  . Smoking status: Former Smoker    Packs/day: 2.00    Years: 16.00    Pack years: 32.00    Types: Cigarettes    Quit date: 08/23/2017    Years since quitting: 2.8  . Smokeless tobacco: Never Used  Vaping Use  . Vaping Use: Never used  Substance and Sexual Activity  . Alcohol use: No    Comment: have had alcohol in the past, not heavy  . Drug use: No  . Sexual activity: Not on file  Other Topics Concern  . Not on file  Social History Narrative  . Not on file   Social Determinants of Health   Financial Resource Strain: Low Risk   . Difficulty of Paying Living Expenses: Not very hard  Food Insecurity: No Food Insecurity  . Worried About Charity fundraiser in the Last Year: Never true  . Ran Out of Food in the Last Year: Never true  Transportation Needs: No Transportation Needs  . Lack of Transportation (Medical): No  . Lack of Transportation (Non-Medical): No  Physical Activity: Inactive  . Days of Exercise per Week: 0 days  . Minutes of Exercise per Session: 0 min  Stress: Stress Concern Present  . Feeling of Stress : Rather much  Social Connections: Socially Isolated  . Frequency of Communication with Friends and Family: More than three times a week  . Frequency of Social Gatherings with Friends and Family: Once a week  . Attends Religious Services: Never  . Active Member of Clubs or Organizations: No  . Attends Archivist Meetings: Never  . Marital Status: Divorced  Human resources officer Violence: Not At Risk  . Fear of Current or Ex-Partner: No  . Emotionally Abused: No  . Physically Abused: No  . Sexually Abused: No    Outpatient Medications Prior to Visit  Medication Sig Dispense Refill  . acetaminophen (TYLENOL) 325 MG tablet Take 650 mg by mouth every 6 (six) hours as needed for mild pain.    Marland Kitchen albuterol (PROVENTIL) (2.5 MG/3ML) 0.083% nebulizer solution Inhale 1  vial via nebulizer every 6 hours as needed for wheezing or shortness of breath 270 mL 1  . allopurinol (ZYLOPRIM) 300 MG tablet TAKE 1 Tablet BY MOUTH ONCE EVERY DAY 30 tablet 1  . atorvastatin (LIPITOR) 40 MG tablet Take 1 tablet (40 mg total) by mouth daily. 90 tablet 0  . Blood Glucose Monitoring Suppl (TRUE METRIX METER) w/Device KIT USE AS DIRECTED 1 kit 0  . busPIRone (BUSPAR) 10 MG tablet TAKE 1 TABLET THREE TIMES DAILY 270 tablet 0  . citalopram (CELEXA) 40 MG tablet TAKE 1 TABLET EVERY DAY 90 tablet 0  . docusate sodium (COLACE) 100 MG capsule Take 200 mg by mouth daily as needed for mild  constipation.     . gabapentin (NEURONTIN) 100 MG capsule TAKE 1 CAPSULE TWICE DAILY AS NEEDED 180 capsule 0  . Krill Oil 350 MG CAPS Take 1 capsule by mouth in the morning and at bedtime.     Marland Kitchen lisinopril (ZESTRIL) 20 MG tablet Take 1 tablet (20 mg total) by mouth daily. 90 tablet 3  . loratadine (CLARITIN) 10 MG tablet Take 10 mg by mouth daily as needed for allergies.    . metFORMIN (GLUCOPHAGE) 850 MG tablet TAKE 1 TABLET BY MOUTH TWICE DAILY WITH A MEAL 60 tablet 0  . metolazone (ZAROXOLYN) 5 MG tablet Take 1 tablet (5 mg total) by mouth 3 (three) times a week. 36 tablet 3  . metoprolol tartrate (LOPRESSOR) 100 MG tablet Take 1 tablet (100 mg total) by mouth 2 (two) times daily. 180 tablet 3  . potassium chloride SA (KLOR-CON) 20 MEQ tablet TAKE 3 TABS DAILY AND TAKE AN ADDITIONAL 1 TAB ON THE DAY YOU TAKE YOUR WEEKLY METOLAZONE DOSE 283 tablet 3  . rivaroxaban (XARELTO) 20 MG TABS tablet TAKE 1 Tablet BY MOUTH ONCE EVERY DAY WITH SUPPER 90 tablet 3  . torsemide (DEMADEX) 20 MG tablet Take 2 tablets (40 mg total) by mouth 2 (two) times daily. 360 tablet 3  . TRUE METRIX BLOOD GLUCOSE TEST test strip TEST ONE TIME DAILY FOR DIABETES 100 strip 0  . TRUEplus Lancets 33G MISC TEST ONE TIME DAILY FOR DIABETES 100 each 0  . linagliptin (TRADJENTA) 5 MG TABS tablet Take 1 tablet (5 mg total) by mouth  daily. 90 tablet 3  . albuterol (VENTOLIN HFA) 108 (90 Base) MCG/ACT inhaler Inhale 2 puffs into the lungs every 6 (six) hours as needed for wheezing or shortness of breath. 8 g 3  . Semaglutide (RYBELSUS) 3 MG TABS Take 1 tablet by mouth daily. 30 tablet 1   No facility-administered medications prior to visit.    No Known Allergies  ROS Review of Systems  Constitutional: Negative for chills and fever.  HENT: Negative for congestion, sinus pressure, sinus pain and sore throat.   Eyes: Negative for pain and discharge.  Respiratory: Negative for cough and shortness of breath.   Cardiovascular: Negative for chest pain and palpitations.  Gastrointestinal: Negative for constipation, diarrhea, nausea and vomiting.  Endocrine: Negative for polydipsia and polyuria.  Genitourinary: Negative for dysuria and hematuria.  Musculoskeletal: Positive for back pain. Negative for neck pain and neck stiffness.  Skin: Negative for rash.  Neurological: Negative for dizziness, weakness and numbness.  Psychiatric/Behavioral: Positive for sleep disturbance. Negative for agitation and behavioral problems. The patient is nervous/anxious.       Objective:    Physical Exam Vitals reviewed.  Constitutional:      General: He is not in acute distress.    Appearance: He is obese. He is not diaphoretic.  HENT:     Head: Normocephalic and atraumatic.     Nose: Nose normal.     Mouth/Throat:     Mouth: Mucous membranes are moist.  Eyes:     General: No scleral icterus.    Extraocular Movements: Extraocular movements intact.     Pupils: Pupils are equal, round, and reactive to light.  Cardiovascular:     Rate and Rhythm: Normal rate and regular rhythm.     Heart sounds: Murmur (Systolic, most pronounced in right upper sternal border) heard.    Pulmonary:     Breath sounds: Normal breath sounds. No wheezing or rales.  Abdominal:  Palpations: Abdomen is soft.     Tenderness: There is no abdominal  tenderness. There is no right CVA tenderness, left CVA tenderness, guarding or rebound.  Musculoskeletal:     Cervical back: Neck supple. No rigidity or tenderness.     Right lower leg: No edema.     Left lower leg: No edema.  Skin:    General: Skin is warm.     Findings: No rash.  Neurological:     General: No focal deficit present.     Mental Status: He is alert and oriented to person, place, and time.     Sensory: No sensory deficit.     Motor: No weakness.  Psychiatric:        Mood and Affect: Mood normal.        Behavior: Behavior normal.     BP 118/76 (BP Location: Right Arm, Patient Position: Sitting, Cuff Size: Normal)   Pulse 84   Temp 97.8 F (36.6 C) (Temporal)   Ht _0  (1.803 m)   Wt (!) 467 lb (211.8 kg)   SpO2 97%   BMI 65.13 kg/m  Wt Readings from Last 3 Encounters:  06/10/20 (!) 467 lb (211.8 kg)  05/10/20 (!) 479 lb 3.2 oz (217.4 kg)  04/21/20 (!) 469 lb (212.7 kg)     Health Maintenance Due  Topic Date Due  . Hepatitis C Screening  Never done  . OPHTHALMOLOGY EXAM  Never done  . TETANUS/TDAP  Never done  . COLONOSCOPY (Pts 45-21yr Insurance coverage will need to be confirmed)  Never done    There are no preventive care reminders to display for this patient.  Lab Results  Component Value Date   TSH 3.834 03/08/2019   Lab Results  Component Value Date   WBC 13.1 (H) 03/24/2019   HGB 13.1 03/24/2019   HCT 42.6 03/24/2019   MCV 83.5 03/24/2019   PLT 331 03/24/2019   Lab Results  Component Value Date   NA 135 06/04/2020   K 4.4 06/04/2020   CO2 29 06/04/2020   GLUCOSE 316 (H) 06/04/2020   BUN 29 (H) 06/04/2020   CREATININE 1.27 (H) 06/04/2020   BILITOT 1.0 01/01/2020   ALKPHOS 68 01/01/2020   AST 67 (H) 01/01/2020   ALT 52 (H) 01/01/2020   PROT 8.4 (H) 01/01/2020   ALBUMIN 3.6 01/01/2020   CALCIUM 8.8 (L) 06/04/2020   ANIONGAP 11 06/04/2020   Lab Results  Component Value Date   CHOL 165 01/01/2020   Lab Results  Component  Value Date   HDL 25 (L) 01/01/2020   Lab Results  Component Value Date   LDLCALC 67 01/01/2020   Lab Results  Component Value Date   TRIG 363 (H) 01/01/2020   Lab Results  Component Value Date   CHOLHDL 6.6 01/01/2020   Lab Results  Component Value Date   HGBA1C 7.3 (A) 02/10/2020   HGBA1C 7.3 02/10/2020   HGBA1C 7.3 (A) 02/10/2020   HGBA1C 7.3 (A) 02/10/2020      Assessment & Plan:   Problem List Items Addressed This Visit      Cardiovascular and Mediastinum   Essential hypertension (Chronic)    BP Readings from Last 1 Encounters:  06/10/20 118/76   Well-controlled with Lisinopril and Metoprolol Counseled for compliance with the medications Advised DASH diet and moderate exercise/walking, at least 150 mins/week       Paroxysmal atrial fibrillation (HCC)    On Metoprolol Eliquis Follows up with Cardiology  Respiratory   COPD (chronic obstructive pulmonary disease) (HCC)    Uses Albuterol PRN Stable for now, not on any maintenance inhaler      Relevant Medications   albuterol (VENTOLIN HFA) 108 (90 Base) MCG/ACT inhaler   OSA on CPAP    Needs to obtain a new CPAP device Follows up with Pulmonology        Endocrine   DM2 (diabetes mellitus, type 2) ([place]) - Primary    HbA1C: 7.3 With peripheral neuropathy On Metformin currently Home blood glucose in AM ranges between 140-180 On Januvia, but cost is a limiting factor. Will try Jardiance. Could not get Rybelsus, resent the prescription On Atorvastatin 40 mg QD Check CMP and HbA1C      Relevant Medications   empagliflozin (JARDIANCE) 25 MG TABS tablet   Semaglutide (RYBELSUS) 3 MG TABS    Other Visit Diagnoses    Screening for congenital eye disease       Relevant Orders   Ambulatory referral to Ophthalmology      Meds ordered this encounter  Medications  . empagliflozin (JARDIANCE) 25 MG TABS tablet    Sig: Take 1 tablet (25 mg total) by mouth daily before breakfast.    Dispense:   90 tablet    Refill:  1  . albuterol (VENTOLIN HFA) 108 (90 Base) MCG/ACT inhaler    Sig: Inhale 2 puffs into the lungs every 6 (six) hours as needed for wheezing or shortness of breath.    Dispense:  8 g    Refill:  3  . Semaglutide (RYBELSUS) 3 MG TABS    Sig: Take 1 tablet by mouth daily.    Dispense:  90 tablet    Refill:  1    Follow-up: Return in about 4 months (around 10/08/2020).    [name], MD

## 2020-06-10 NOTE — Assessment & Plan Note (Signed)
BP Readings from Last 1 Encounters:  06/10/20 118/76   Well-controlled with Lisinopril and Metoprolol Counseled for compliance with the medications Advised DASH diet and moderate exercise/walking, at least 150 mins/week

## 2020-06-10 NOTE — Assessment & Plan Note (Signed)
Uses Albuterol PRN Stable for now, not on any maintenance inhaler

## 2020-06-10 NOTE — Assessment & Plan Note (Signed)
On Metoprolol Eliquis Follows up with Cardiology

## 2020-06-10 NOTE — Assessment & Plan Note (Signed)
Needs to obtain a new CPAP device Follows up with Pulmonology

## 2020-06-10 NOTE — Assessment & Plan Note (Signed)
HbA1C: 7.3 With peripheral neuropathy On Metformin currently Home blood glucose in AM ranges between 140-180 On Januvia, but cost is a limiting factor. Will try Jardiance. Could not get Rybelsus, resent the prescription On Atorvastatin 40 mg QD Check CMP and HbA1C

## 2020-06-11 ENCOUNTER — Telehealth: Payer: Self-pay

## 2020-06-11 ENCOUNTER — Telehealth: Payer: Self-pay | Admitting: Student

## 2020-06-11 ENCOUNTER — Other Ambulatory Visit: Payer: Self-pay | Admitting: Internal Medicine

## 2020-06-11 ENCOUNTER — Ambulatory Visit: Payer: Medicare HMO | Admitting: Internal Medicine

## 2020-06-11 DIAGNOSIS — R945 Abnormal results of liver function studies: Secondary | ICD-10-CM

## 2020-06-11 DIAGNOSIS — I10 Essential (primary) hypertension: Secondary | ICD-10-CM

## 2020-06-11 DIAGNOSIS — J449 Chronic obstructive pulmonary disease, unspecified: Secondary | ICD-10-CM

## 2020-06-11 DIAGNOSIS — R7989 Other specified abnormal findings of blood chemistry: Secondary | ICD-10-CM

## 2020-06-11 DIAGNOSIS — E1169 Type 2 diabetes mellitus with other specified complication: Secondary | ICD-10-CM

## 2020-06-11 DIAGNOSIS — I5032 Chronic diastolic (congestive) heart failure: Secondary | ICD-10-CM

## 2020-06-11 LAB — CBC WITH DIFFERENTIAL/PLATELET
Basophils Absolute: 0.1 10*3/uL (ref 0.0–0.2)
Basos: 1 %
EOS (ABSOLUTE): 0.5 10*3/uL — ABNORMAL HIGH (ref 0.0–0.4)
Eos: 4 %
Hematocrit: 42.2 % (ref 37.5–51.0)
Hemoglobin: 13.1 g/dL (ref 13.0–17.7)
Immature Grans (Abs): 0.1 10*3/uL (ref 0.0–0.1)
Immature Granulocytes: 1 %
Lymphocytes Absolute: 3 10*3/uL (ref 0.7–3.1)
Lymphs: 22 %
MCH: 25.3 pg — ABNORMAL LOW (ref 26.6–33.0)
MCHC: 31 g/dL — ABNORMAL LOW (ref 31.5–35.7)
MCV: 82 fL (ref 79–97)
Monocytes Absolute: 0.6 10*3/uL (ref 0.1–0.9)
Monocytes: 4 %
Neutrophils Absolute: 9.6 10*3/uL — ABNORMAL HIGH (ref 1.4–7.0)
Neutrophils: 68 %
Platelets: 297 10*3/uL (ref 150–450)
RBC: 5.17 x10E6/uL (ref 4.14–5.80)
RDW: 17.2 % — ABNORMAL HIGH (ref 11.6–15.4)
WBC: 13.9 10*3/uL — ABNORMAL HIGH (ref 3.4–10.8)

## 2020-06-11 LAB — CMP14+EGFR
ALT: 46 IU/L — ABNORMAL HIGH (ref 0–44)
AST: 59 IU/L — ABNORMAL HIGH (ref 0–40)
Albumin/Globulin Ratio: 1.1 — ABNORMAL LOW (ref 1.2–2.2)
Albumin: 4.1 g/dL (ref 4.0–5.0)
Alkaline Phosphatase: 99 IU/L (ref 44–121)
BUN/Creatinine Ratio: 16 (ref 9–20)
BUN: 20 mg/dL (ref 6–24)
Bilirubin Total: 0.8 mg/dL (ref 0.0–1.2)
CO2: 26 mmol/L (ref 20–29)
Calcium: 9.5 mg/dL (ref 8.7–10.2)
Chloride: 94 mmol/L — ABNORMAL LOW (ref 96–106)
Creatinine, Ser: 1.28 mg/dL — ABNORMAL HIGH (ref 0.76–1.27)
GFR calc Af Amer: 75 mL/min/{1.73_m2} (ref >59)
GFR calc non Af Amer: 65 mL/min/{1.73_m2} (ref >59)
Globulin, Total: 3.7 g/dL (ref 1.5–4.5)
Glucose: 172 mg/dL — ABNORMAL HIGH (ref 65–99)
Potassium: 4 mmol/L (ref 3.5–5.2)
Sodium: 138 mmol/L (ref 134–144)
Total Protein: 7.8 g/dL (ref 6.0–8.5)

## 2020-06-11 LAB — LIPID PANEL
Chol/HDL Ratio: 4.9 ratio (ref 0.0–5.0)
Cholesterol, Total: 123 mg/dL (ref 100–199)
HDL: 25 mg/dL — ABNORMAL LOW (ref >39)
LDL Chol Calc (NIH): 54 mg/dL (ref 0–99)
Triglycerides: 281 mg/dL — ABNORMAL HIGH (ref 0–149)
VLDL Cholesterol Cal: 44 mg/dL — ABNORMAL HIGH (ref 5–40)

## 2020-06-11 LAB — HEMOGLOBIN A1C
Est. average glucose Bld gHb Est-mCnc: 163 mg/dL
Hgb A1c MFr Bld: 7.3 % — ABNORMAL HIGH (ref 4.8–5.6)

## 2020-06-11 LAB — TSH: TSH: 5.24 u[IU]/mL — ABNORMAL HIGH (ref 0.450–4.500)

## 2020-06-11 NOTE — Telephone Encounter (Signed)
Reginald [name] previous client of [facility] and [name] prior to receiving insurance. Reginald [name] now is given support during this transition and navigation of new Medicare as well as a new primary care provider. For all medical questions Reginald [name] is directed to follow up with his primary care Dr. Posey Pronto or his cardiologist for guidance. Reginald [name] states understanding.  Discussed with Reginald [name] the resource for the government supported program for households to order Free At Home Covid screening tests. Client texted link to [employer]. he was able to navigate easily and ordered his tests.  Will remain available for support only and connections to any resources available if needed.   Debria Garret RN  [employer]

## 2020-06-11 NOTE — Telephone Encounter (Signed)
Bernerd Pho, PA-C had put in orders for patient to have lab work drawn at the end of February. I have called the patient to let him know. I will also let [place] know that patient had creatine and BMP drawn on 06/10/20 by Dr. Posey Pronto.

## 2020-06-11 NOTE — Telephone Encounter (Signed)
Patient called to let [place] know he had lab work done yesterday by Dr. Posey Pronto. He is hoping she can see his Creatine in his Basic Metabolic Panel from yesterday. Please let him to let him know if that is ok for her or if he needs to do another one for [place] too.

## 2020-06-14 ENCOUNTER — Other Ambulatory Visit: Payer: Self-pay | Admitting: Internal Medicine

## 2020-06-14 ENCOUNTER — Telehealth: Payer: Self-pay

## 2020-06-14 DIAGNOSIS — E1169 Type 2 diabetes mellitus with other specified complication: Secondary | ICD-10-CM

## 2020-06-14 MED ORDER — GLIPIZIDE 5 MG PO TABS: 5.0000 mg | ORAL_TABLET | Freq: Two times a day (BID) | ORAL | 3 refills | Status: DC

## 2020-06-14 NOTE — Telephone Encounter (Signed)
Please advise him that I have sent Glipizide prescription. He should continue to take Metformin along with it.

## 2020-06-14 NOTE — Addendum Note (Signed)
Addended byIhor Dow on: 06/14/2020 04:51 PM   Modules accepted: Orders

## 2020-06-14 NOTE — Telephone Encounter (Signed)
Pt says that the 2 medications you put him on are really expensive. The Rybelsus and the Jardiance. They told him it would cover Glipizide and Lantus. He wants to know what you advise.

## 2020-06-15 NOTE — Telephone Encounter (Signed)
Pt informed

## 2020-06-18 ENCOUNTER — Other Ambulatory Visit (HOSPITAL_COMMUNITY)
Admission: RE | Admit: 2020-06-18 | Discharge: 2020-06-18 | Disposition: A | Discharge status: Home / Self Care | Payer: Medicare HMO | Source: Ambulatory Visit | Attending: Student | Admitting: Student

## 2020-06-18 ENCOUNTER — Other Ambulatory Visit: Payer: Self-pay

## 2020-06-18 DIAGNOSIS — Z79899 Other long term (current) drug therapy: Secondary | ICD-10-CM | POA: Diagnosis not present

## 2020-06-18 LAB — BASIC METABOLIC PANEL
Anion gap: 8 (ref 5–15)
BUN: 28 mg/dL — ABNORMAL HIGH (ref 6–20)
CO2: 27 mmol/L (ref 22–32)
Calcium: 8.9 mg/dL (ref 8.9–10.3)
Chloride: 99 mmol/L (ref 98–111)
Creatinine, Ser: 1.27 mg/dL — ABNORMAL HIGH (ref 0.61–1.24)
GFR, Estimated: >60 mL/min (ref >60)
Glucose, Bld: 161 mg/dL — ABNORMAL HIGH (ref 70–99)
Potassium: 4.4 mmol/L (ref 3.5–5.1)
Sodium: 134 mmol/L — ABNORMAL LOW (ref 135–145)

## 2020-06-21 ENCOUNTER — Telehealth: Payer: Self-pay

## 2020-06-21 MED ORDER — METOLAZONE 5 MG PO TABS: ORAL_TABLET | ORAL | 3 refills | Status: DC

## 2020-06-21 NOTE — Telephone Encounter (Signed)
Patient contacted and verbalized understanding of results. Patient states that his weight had been stable and that he only takes the Metolazone on a PRN basis.

## 2020-06-21 NOTE — Telephone Encounter (Signed)
-----   Message from Erma Heritage, [place] sent at 06/19/2020 11:39 AM EST ----- Please let the patient know his K+ remains within a normal range. Na+ slightly low but consistent with prior values. Kidney function overall stable when compared to recent values as well. Has his weight been stable? Since switching to Torsemide last year, has it been fluctuating as much as previously? If stable, he might can just take Metolazone on an as needed basis instead of regularly (I believe prior notes mentioned he was taking this once weekly).

## 2020-07-07 ENCOUNTER — Other Ambulatory Visit: Payer: Self-pay | Admitting: Internal Medicine

## 2020-07-07 DIAGNOSIS — E1159 Type 2 diabetes mellitus with other circulatory complications: Secondary | ICD-10-CM

## 2020-07-09 ENCOUNTER — Encounter (HOSPITAL_COMMUNITY): Payer: Self-pay

## 2020-07-09 ENCOUNTER — Emergency Department (HOSPITAL_COMMUNITY): Payer: Medicare HMO

## 2020-07-09 ENCOUNTER — Emergency Department (HOSPITAL_COMMUNITY)
Admission: EM | Admit: 2020-07-09 | Discharge: 2020-07-10 | Disposition: A | Discharge status: Home / Self Care | Payer: Medicare HMO | Attending: Emergency Medicine | Admitting: Emergency Medicine

## 2020-07-09 ENCOUNTER — Other Ambulatory Visit: Payer: Self-pay

## 2020-07-09 DIAGNOSIS — Z7901 Long term (current) use of anticoagulants: Secondary | ICD-10-CM | POA: Insufficient documentation

## 2020-07-09 DIAGNOSIS — I110 Hypertensive heart disease with heart failure: Secondary | ICD-10-CM | POA: Diagnosis not present

## 2020-07-09 DIAGNOSIS — Z87891 Personal history of nicotine dependence: Secondary | ICD-10-CM | POA: Diagnosis not present

## 2020-07-09 DIAGNOSIS — Z950 Presence of cardiac pacemaker: Secondary | ICD-10-CM | POA: Insufficient documentation

## 2020-07-09 DIAGNOSIS — E119 Type 2 diabetes mellitus without complications: Secondary | ICD-10-CM | POA: Insufficient documentation

## 2020-07-09 DIAGNOSIS — R0602 Shortness of breath: Secondary | ICD-10-CM | POA: Diagnosis not present

## 2020-07-09 DIAGNOSIS — R0789 Other chest pain: Secondary | ICD-10-CM | POA: Diagnosis not present

## 2020-07-09 DIAGNOSIS — Z79899 Other long term (current) drug therapy: Secondary | ICD-10-CM | POA: Insufficient documentation

## 2020-07-09 DIAGNOSIS — I480 Paroxysmal atrial fibrillation: Secondary | ICD-10-CM | POA: Insufficient documentation

## 2020-07-09 DIAGNOSIS — R079 Chest pain, unspecified: Secondary | ICD-10-CM | POA: Diagnosis not present

## 2020-07-09 DIAGNOSIS — J811 Chronic pulmonary edema: Secondary | ICD-10-CM | POA: Diagnosis not present

## 2020-07-09 DIAGNOSIS — J449 Chronic obstructive pulmonary disease, unspecified: Secondary | ICD-10-CM | POA: Diagnosis not present

## 2020-07-09 DIAGNOSIS — Z7984 Long term (current) use of oral hypoglycemic drugs: Secondary | ICD-10-CM | POA: Diagnosis not present

## 2020-07-09 DIAGNOSIS — I5032 Chronic diastolic (congestive) heart failure: Secondary | ICD-10-CM | POA: Diagnosis not present

## 2020-07-09 DIAGNOSIS — J80 Acute respiratory distress syndrome: Secondary | ICD-10-CM | POA: Diagnosis not present

## 2020-07-09 DIAGNOSIS — I517 Cardiomegaly: Secondary | ICD-10-CM | POA: Diagnosis not present

## 2020-07-09 DIAGNOSIS — I509 Heart failure, unspecified: Secondary | ICD-10-CM | POA: Diagnosis not present

## 2020-07-09 LAB — BASIC METABOLIC PANEL
Anion gap: 12 (ref 5–15)
BUN: 33 mg/dL — ABNORMAL HIGH (ref 6–20)
CO2: 25 mmol/L (ref 22–32)
Calcium: 9.2 mg/dL (ref 8.9–10.3)
Chloride: 95 mmol/L — ABNORMAL LOW (ref 98–111)
Creatinine, Ser: 1.83 mg/dL — ABNORMAL HIGH (ref 0.61–1.24)
GFR, Estimated: 45 mL/min — ABNORMAL LOW (ref >60)
Glucose, Bld: 106 mg/dL — ABNORMAL HIGH (ref 70–99)
Potassium: 3.4 mmol/L — ABNORMAL LOW (ref 3.5–5.1)
Sodium: 132 mmol/L — ABNORMAL LOW (ref 135–145)

## 2020-07-09 LAB — CBC
HCT: 39.7 % (ref 39.0–52.0)
Hemoglobin: 12.3 g/dL — ABNORMAL LOW (ref 13.0–17.0)
MCH: 25.5 pg — ABNORMAL LOW (ref 26.0–34.0)
MCHC: 31 g/dL (ref 30.0–36.0)
MCV: 82.2 fL (ref 80.0–100.0)
Platelets: 302 10*3/uL (ref 150–400)
RBC: 4.83 MIL/uL (ref 4.22–5.81)
RDW: 18.3 % — ABNORMAL HIGH (ref 11.5–15.5)
WBC: 15.8 10*3/uL — ABNORMAL HIGH (ref 4.0–10.5)
nRBC: 0 % (ref 0.0–0.2)

## 2020-07-09 LAB — BRAIN NATRIURETIC PEPTIDE: B Natriuretic Peptide: 70 pg/mL (ref 0.0–100.0)

## 2020-07-09 LAB — TROPONIN I (HIGH SENSITIVITY)
Troponin I (High Sensitivity): 6 ng/L (ref <18)
Troponin I (High Sensitivity): 7 ng/L (ref <18)

## 2020-07-09 LAB — D-DIMER, QUANTITATIVE: D-Dimer, Quant: 0.39 ug/mL-FEU (ref 0.00–0.50)

## 2020-07-09 NOTE — ED Provider Notes (Signed)
[facility] EMERGENCY DEPARTMENT Provider Note   CSN: [id number] Arrival date & time: 07/09/20  1929     History Chief Complaint  Patient presents with  . Chest Pain    Reginald [name] is a 50 y.o. male.  Patient came in from home by POV.  Patient states that at about 1700 tonight by was sitting in his truck he got a heavy pressure in the anterior part of his chest.  That went towards the back area.  Lasted about 20 minutes then resolved.  Had another episode shortly after that.  But lasted for shorter period of time.  Patient is followed by cardiology here in [place].  Does have a Environmental health practitioner pacemaker and a valve replacement 2 years ago.  Patient also states he feels as if he has some shortness of breath.  No anterior chest pain now.  Patient's had a history of pulmonary embolus in the past he states.  Patient is on Demadex.        Past Medical History:  Diagnosis Date  . Anxiety   . Aortic stenosis   . Arthritis   . Asthma   . Back pain   . Cellulitis of skin with lymphangitis   . CHF (congestive heart failure) ([place])   . Chronic diastolic congestive heart failure ([place])   . Chronic venous insufficiency   . Constipation   . COPD (chronic obstructive pulmonary disease) ([place])   . Depression   . Dyspnea   . Essential hypertension   . Gout   . Heart murmur   . Hypertension   . Morbid obesity ([place])   . Obesity   . Pneumonia    walking pneumonia  . Pre-diabetes   . Prostatitis   . Pulmonary embolism ([place])   . Pulmonary hypertension ([place])   . Pyelonephritis   . S/P aortic valve replacement with bioprosthetic valve 08/23/2017   25 mm Edwards Inspiris Resilia stented bovine pericardial tissue valve  . 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 Sleep apnea    cpap   . Thoracic ascending aortic aneurysm ([place])   . Thoracic ascending aortic aneurysm ([place])   . Tobacco abuse     Patient Active Problem List   Diagnosis  Date Noted  . S/P placement of cardiac pacemaker 02/10/2020  . Encounter to establish care 02/10/2020  . Anxiety 02/10/2020  . DM2 (diabetes mellitus, type 2) ([place]) 02/10/2020  . OSA on CPAP   . Abnormal liver function 03/16/2019  . History of pulmonary embolism 03/16/2019  . Paroxysmal atrial fibrillation ([place]) 09/05/2018  . COPD (chronic obstructive pulmonary disease) ([place]) 05/07/2018  . Gastroesophageal reflux disease   . Pulmonary embolism ([place]) 09/18/2017  . Encounter for therapeutic drug monitoring 09/04/2017  . 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 08/23/2017  . 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 Pulmonary hypertension ([place])   . Chronic periodontitis 07/18/2017  . Morbid obesity ([place])   . Essential hypertension   . Tobacco abuse   . Chronic heart failure with preserved ejection fraction (HFpEF) ([place])   . Chronic venous insufficiency     Past Surgical History:  Procedure Laterality Date  . AORTIC VALVE REPLACEMENT N/A 08/23/2017   Procedure: AORTIC VALVE REPLACEMENT (AVR) USING INSPIRIS RESILIA AORTIC VALVE SIZE 25 MM;  Surgeon: Rexene Alberts, MD;  Location: [facility];  Service: Open Heart Surgery;  Laterality: N/A;  . CARDIAC VALVE  REPLACEMENT N/A    Phreesia 02/07/2020  . MULTIPLE EXTRACTIONS WITH ALVEOLOPLASTY N/A 07/23/2017   Procedure: Extraction of tooth #10 with alveoloplasty and gross debridement of remaining teeth;  Surgeon: Lenn Cal, DDS;  Location: [facility];  Service: Oral Surgery;  Laterality: N/A;  . NO PAST SURGERIES    . PACEMAKER IMPLANT N/A 08/28/2017    St Jude Medical Assurity MRI conditional  dual-chamber pacemaker for symptomatic complete heart blockby Dr Rayann Heman  . TEE WITHOUT CARDIOVERSION N/A 07/16/2017   Procedure: TRANSESOPHAGEAL ECHOCARDIOGRAM (TEE);  Surgeon: Lelon Perla, MD;  Location: [facility] ENDOSCOPY;  Service: Cardiovascular;  Laterality: N/A;  . TEE WITHOUT CARDIOVERSION  N/A 08/23/2017   Procedure: TRANSESOPHAGEAL ECHOCARDIOGRAM (TEE);  Surgeon: Rexene Alberts, MD;  Location: [facility];  Service: Open Heart Surgery;  Laterality: N/A;  . THORACIC AORTIC ANEURYSM REPAIR N/A 08/23/2017   Procedure: THORACIC ASCENDING ANEURYSM REPAIR (AAA) USING HEMASHIELD GOLD KNITTED MICROVEL DOUBLE VELOUR VASCULAR GRAFT D: 24 MM  L: 30 CM;  Surgeon: Rexene Alberts, MD;  Location: [facility];  Service: Open Heart Surgery;  Laterality: N/A;       Family History  Problem Relation Age of Onset  . Hypertension Mother   . Alzheimer's disease Mother   . Heart attack Brother   . Diabetes Brother     Social History   Tobacco Use  . Smoking status: Former Smoker    Packs/day: 2.00    Years: 16.00    Pack years: 32.00    Types: Cigarettes    Quit date: 08/23/2017    Years since quitting: 2.8  . Smokeless tobacco: Never Used  Vaping Use  . Vaping Use: Never used  Substance Use Topics  . Alcohol use: No    Comment: have had alcohol in the past, not heavy  . Drug use: No    Home Medications Prior to Admission medications   Medication Sig Start Date End Date Taking? Authorizing Provider  acetaminophen (TYLENOL) 325 MG tablet Take 650 mg by mouth every 6 (six) hours as needed for mild pain.   Yes [provider]  albuterol (PROVENTIL) (2.5 MG/3ML) 0.083% nebulizer solution Inhale 1 vial via nebulizer every 6 hours as needed for wheezing or shortness of breath 02/27/20  Yes [name], MD  albuterol (VENTOLIN HFA) 108 (90 Base) MCG/ACT inhaler Inhale 2 puffs into the lungs every 6 (six) hours as needed for wheezing or shortness of breath. 06/10/20  Yes [name], MD  allopurinol (ZYLOPRIM) 300 MG tablet TAKE 1 Tablet BY MOUTH ONCE EVERY DAY 03/12/20  Yes [name], MD  atorvastatin (LIPITOR) 40 MG tablet TAKE 1 TABLET EVERY DAY 07/07/20  Yes [name], MD  busPIRone (BUSPAR) 10 MG tablet TAKE 1 TABLET THREE TIMES DAILY 05/24/20  Yes [name], MD   citalopram (CELEXA) 40 MG tablet TAKE 1 TABLET EVERY DAY 05/24/20  Yes [name], MD  gabapentin (NEURONTIN) 100 MG capsule TAKE 1 CAPSULE TWICE DAILY AS NEEDED Patient taking differently: Take 100 mg by mouth 2 (two) times daily. 05/11/20  Yes [name], MD  glipiZIDE (GLUCOTROL) 5 MG tablet Take 1 tablet (5 mg total) by mouth 2 (two) times daily before a meal. 06/14/20  Yes [name], MD  guaiFENesin (MUCINEX) 600 MG 12 hr tablet Take 600 mg by mouth 2 (two) times daily.   Yes [provider]  Astrid Drafts 350 MG CAPS Take 1 capsule by mouth in the  morning and at bedtime.    Yes [provider]  lisinopril (ZESTRIL) 20 MG tablet Take 1 tablet (20 mg total) by mouth daily. 04/21/20 07/20/20 Yes Strader, Fransisco Hertz, PA-C  metFORMIN (GLUCOPHAGE) 850 MG tablet TAKE 1 TABLET BY MOUTH TWICE DAILY WITH A MEAL 03/17/20  Yes [name], MD  metoprolol tartrate (LOPRESSOR) 100 MG tablet Take 1 tablet (100 mg total) by mouth 2 (two) times daily. 02/26/20  Yes Strader, [place] M, PA-C  potassium chloride SA (KLOR-CON) 20 MEQ tablet TAKE 3 TABS DAILY AND TAKE AN ADDITIONAL 1 TAB ON THE DAY YOU TAKE YOUR WEEKLY METOLAZONE DOSE 05/13/20  Yes Strader, [place] M, PA-C  rivaroxaban (XARELTO) 20 MG TABS tablet TAKE 1 Tablet BY MOUTH ONCE EVERY DAY WITH SUPPER 02/26/20  Yes Strader, [place] M, PA-C  torsemide (DEMADEX) 20 MG tablet Take 2 tablets (40 mg total) by mouth 2 (two) times daily. 04/21/20 07/20/20 Yes Strader, Fransisco Hertz, PA-C  Blood Glucose Monitoring Suppl (TRUE METRIX METER) w/Device KIT USE AS DIRECTED 05/11/20   [name], MD  docusate sodium (COLACE) 100 MG capsule Take 200 mg by mouth daily as needed for mild constipation.  Patient not taking: Reported on 07/09/2020    [provider]  loratadine (CLARITIN) 10 MG tablet Take 10 mg by mouth daily as needed for allergies. Patient not taking: Reported on 07/09/2020    [provider]  metolazone  (ZAROXOLYN) 5 MG tablet PRN Patient taking differently: Take 5 mg by mouth daily as needed (swelling). PRN 06/21/20   Allred, Jeneen Rinks, MD  TRUE METRIX BLOOD GLUCOSE TEST test strip TEST ONE TIME DAILY FOR DIABETES 05/27/20   [name], MD  TRUEplus Lancets 33G MISC TEST ONE TIME DAILY FOR DIABETES 05/27/20   [name], MD    Allergies    Patient has no known allergies.  Review of Systems   Review of Systems  Constitutional: Negative for chills and fever.  HENT: Negative for congestion, rhinorrhea and sore throat.   Eyes: Negative for visual disturbance.  Respiratory: Positive for shortness of breath. Negative for cough.   Cardiovascular: Positive for chest pain. Negative for leg swelling.  Gastrointestinal: Negative for abdominal pain, diarrhea, nausea and vomiting.  Genitourinary: Negative for dysuria.  Musculoskeletal: Negative for back pain and neck pain.  Skin: Negative for rash.  Neurological: Negative for dizziness, light-headedness and headaches.  Hematological: Does not bruise/bleed easily.  Psychiatric/Behavioral: Negative for confusion.    Physical Exam Updated Vital Signs BP (!) 100/59 (BP Location: Right Wrist)   Pulse 80   Temp 98.4 F (36.9 C) (Oral)   Resp 18   Ht 1.803 m (5' 11")   Wt (!) 213.2 kg   SpO2 99%   BMI 65.55 kg/m   Physical Exam Vitals and nursing note reviewed.  Constitutional:      Appearance: Normal appearance. He is well-developed and well-nourished.  HENT:     Head: Normocephalic and atraumatic.  Eyes:     Extraocular Movements: Extraocular movements intact.     Conjunctiva/sclera: Conjunctivae normal.     Pupils: Pupils are equal, round, and reactive to light.  Cardiovascular:     Rate and Rhythm: Normal rate and regular rhythm.     Heart sounds: No murmur heard.   Pulmonary:     Effort: Pulmonary effort is normal. No respiratory distress.     Breath sounds: Normal breath sounds.  Chest:     Chest wall: No tenderness.  Abdominal:     Palpations: Abdomen is soft.     Tenderness: There is no abdominal tenderness.  Musculoskeletal:        General: Swelling present. No edema.     Cervical back: Neck supple.  Skin:    General: Skin is warm and dry.  Neurological:     General: No focal deficit present.     Mental Status: He is alert and oriented to person, place, and time.     Cranial Nerves: No cranial nerve deficit.     Sensory: No sensory deficit.     Motor: No weakness.  Psychiatric:        Mood and Affect: Mood and affect normal.     ED Results / Procedures / Treatments   Labs (all labs ordered are listed, but only abnormal results are displayed) Labs Reviewed  BASIC METABOLIC PANEL - Abnormal; Notable for the following components:      Result Value   Sodium 132 (*)    Potassium 3.4 (*)    Chloride 95 (*)    Glucose, Bld 106 (*)    BUN 33 (*)    Creatinine, Ser 1.83 (*)    GFR, Estimated 45 (*)    All other components within normal limits  CBC - Abnormal; Notable for the following components:   WBC 15.8 (*)    Hemoglobin 12.3 (*)    MCH 25.5 (*)    RDW 18.3 (*)    All other components within normal limits  D-DIMER, QUANTITATIVE  BRAIN NATRIURETIC PEPTIDE  TROPONIN I (HIGH SENSITIVITY)  TROPONIN I (HIGH SENSITIVITY)    EKG EKG Interpretation  Date/Time:  Friday July 09 2020 19:28:26 EST Ventricular Rate:  82 PR Interval:  198 QRS Duration: 164 QT Interval:  448 QTC Calculation: 523 R Axis:   129 Text Interpretation: Normal sinus rhythm Right bundle branch block Abnormal ECG No significant change since last tracing Confirmed by Fredia Sorrow [phone]) on 07/09/2020 9:37:02 PM   Radiology DG Chest Portable 1 View  Result Date: 07/09/2020 CLINICAL DATA:  Chest pain, shortness of breath, chest pressure EXAM: PORTABLE CHEST 1 VIEW COMPARISON:  03/15/2019 radiograph, 04/22/2018 CT FINDINGS: Slightly increased pulmonary vascular congestion. Findings on a background of  chronically coarsened interstitial and bronchitic features which are similar to prior. No convincing features of frank edema at this time. No pneumothorax or visible effusions. Cardiomegaly is similar to comparison portable radiography. Postsurgical changes related to prior CABG including intact and aligned sternotomy wires and multiple surgical clips projecting over the mediastinum. Pacer pack overlies left chest wall with intact leads terminating towards the cardiac apex and right atrium. IMPRESSION: 1. Cardiomegaly with increasing pulmonary vascular congestion compared to prior exams could reflect features of CHF albeit without frank edema at this time. 2. Chronically coarsened interstitial and bronchitic changes. 3. Prior pacer placement, sternotomy and CABG. Electronically Signed   By: Lovena Le M.D.   On: 07/09/2020 20:32    Procedures Procedures  Medications Ordered in ED Medications - No data to display  ED Course  I have reviewed the triage vital signs and the nursing notes.  Pertinent labs & imaging results that were available during my care of the patient were reviewed by me and considered in my medical decision making (see chart for details).    MDM Rules/Calculators/A&P                          Patient troponins x2 without  any significant changes.  D-dimer not elevated.  BNP not significantly elevated.  Chest x-ray did raise some question of may be some mild pulmonary edema.  But patient's BNP was 70.  His electrolytes with some abnormalities mostly regarding BUN and creatinine.  Will need to follow-up with that.  But patient's potassium was just slightly low at 3.4.  Certainly no hyperkalemia.  Patient stable for discharge home and follow-up with cardiology.  No evidence of pulmonary embolus no evidence of significant pulmonary edema.  No evidence of acute cardiac event.  Patient has been chest pain-free here. EKG right bundle branch block no change from the  past.       Final Clinical Impression(s) / ED Diagnoses Final diagnoses:  Atypical chest pain    Rx / DC Orders ED Discharge Orders    None       Fredia Sorrow, MD 07/09/20 2258

## 2020-07-09 NOTE — ED Notes (Signed)
Pt in bed, cardiac monitor and O2 sat monitor, pt denies chest pain, states that he just has some pressure.

## 2020-07-09 NOTE — ED Triage Notes (Signed)
Pt arrives from home c/o left chest pain, pressure and heaviness radiating to his back. Pt reports pain came on suddenly apprx at 1700 tonight while he was sitting in his truck. Pt reports having St Jude Pacemaker and valve replacement 2 years ago. Pt presents SOB.

## 2020-07-09 NOTE — Discharge Instructions (Addendum)
Work-up today for the chest pain without any acute findings.  Also screening test for blood clots was negative.  And screening test for too much fluid in the lungs was negative as well.  Return for any new or worse symptoms.  Make an appointment to follow-up with cardiology.

## 2020-07-12 ENCOUNTER — Ambulatory Visit: Payer: Medicare HMO | Admitting: Cardiovascular Disease

## 2020-07-12 ENCOUNTER — Encounter: Payer: Self-pay | Admitting: Cardiovascular Disease

## 2020-07-12 ENCOUNTER — Other Ambulatory Visit: Payer: Self-pay

## 2020-07-12 VITALS — BP 146/84 | HR 75 | Ht 71.0 in | Wt >= 6400 oz

## 2020-07-12 DIAGNOSIS — Z952 Presence of prosthetic heart valve: Secondary | ICD-10-CM

## 2020-07-12 DIAGNOSIS — I10 Essential (primary) hypertension: Secondary | ICD-10-CM

## 2020-07-12 DIAGNOSIS — R079 Chest pain, unspecified: Secondary | ICD-10-CM | POA: Diagnosis not present

## 2020-07-12 NOTE — Progress Notes (Signed)
CARDIOLOGY CONSULT NOTE       Patient ID: Reginald [name] MRN: [id number] DOB/AGE: 07-24-1970 50 y.o.  Admit date: (Not on file) Referring Physician: patel Primary Physician: [name], MD Primary Cardiologist: Allred/Koneswaran Reason for Consultation: Chest Pain  Active Problems:   * No active hospital problems. *   HPI:  50 y.o. referred by [facility] for chest pain. Added on to my DOD schedule  Evaluated 07/09/20 Pain at rest sitting in truck Radiated to back lasted 20 minutes He has history of AVR bovine with root replacement 08/2017 and PPM for CHB He is morbidly obese with history of PE, HTN and OSA.  Valve is a 25 mm Inspiris Resilia PPM is St jude MRI compatible device Weight is over 450 lbs No CAD at cath 06/2017 prior to AVR EF has been normal by Echo He takes diuretic for " chronic diastolic heart failure"  He has had PAF and with history of PE on chronic anticoagulation with xarelto In past unable to afford demedex and was using lasix and zaroxyln      In ER r/o CXR  CE cephalization with no frank CHF  BNP normal 70 D dimer negative BUN 33 Cr 1.8   Since ER d/c no further pain and just chronic dyspnea from morbid obesity   ROS All other systems reviewed and negative except as noted above  Past Medical History:  Diagnosis Date  . Anxiety   . Aortic stenosis   . Arthritis   . Asthma   . Back pain   . Cellulitis of skin with lymphangitis   . CHF (congestive heart failure) ([place])   . Chronic diastolic congestive heart failure ([place])   . Chronic venous insufficiency   . Constipation   . COPD (chronic obstructive pulmonary disease) ([place])   . Depression   . Dyspnea   . Essential hypertension   . Gout   . Heart murmur   . Hypertension   . Morbid obesity ([place])   . Obesity   . Pneumonia    walking pneumonia  . Pre-diabetes   . Prostatitis   . Pulmonary embolism ([place])   . Pulmonary hypertension ([place])   . Pyelonephritis   . S/P aortic valve replacement with  bioprosthetic valve 08/23/2017   25 mm Edwards Inspiris Resilia stented bovine pericardial tissue valve  . 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 Sleep apnea    cpap   . Thoracic ascending aortic aneurysm ([place])   . Thoracic ascending aortic aneurysm ([place])   . Tobacco abuse     Family History  Problem Relation Age of Onset  . Hypertension Mother   . Alzheimer's disease Mother   . Heart attack Brother   . Diabetes Brother     Social History   Socioeconomic History  . Marital status: Divorced    Spouse name: Not on file  . Number of children: Not on file  . Years of education: Not on file  . Highest education level: Not on file  Occupational History  . Not on file  Tobacco Use  . Smoking status: Former Smoker    Packs/day: 2.00    Years: 16.00    Pack years: 32.00    Types: Cigarettes    Quit date: 08/23/2017    Years since quitting: 2.8  . Smokeless tobacco: Never Used  Vaping Use  . Vaping Use: Never used  Substance and Sexual Activity  .  Alcohol use: No    Comment: have had alcohol in the past, not heavy  . Drug use: No  . Sexual activity: Not on file  Other Topics Concern  . Not on file  Social History Narrative  . Not on file   Social Determinants of Health   Financial Resource Strain: Low Risk   . Difficulty of Paying Living Expenses: Not very hard  Food Insecurity: No Food Insecurity  . Worried About Charity fundraiser in the Last Year: Never true  . Ran Out of Food in the Last Year: Never true  Transportation Needs: No Transportation Needs  . Lack of Transportation (Medical): No  . Lack of Transportation (Non-Medical): No  Physical Activity: Inactive  . Days of Exercise per Week: 0 days  . Minutes of Exercise per Session: 0 min  Stress: Stress Concern Present  . Feeling of Stress : Rather much  Social Connections: Socially Isolated  . Frequency of Communication with Friends and Family: More than  three times a week  . Frequency of Social Gatherings with Friends and Family: Once a week  . Attends Religious Services: Never  . Active Member of Clubs or Organizations: No  . Attends Archivist Meetings: Never  . Marital Status: Divorced  Human resources officer Violence: Not At Risk  . Fear of Current or Ex-Partner: No  . Emotionally Abused: No  . Physically Abused: No  . Sexually Abused: No    Past Surgical History:  Procedure Laterality Date  . AORTIC VALVE REPLACEMENT N/A 08/23/2017   Procedure: AORTIC VALVE REPLACEMENT (AVR) USING INSPIRIS RESILIA AORTIC VALVE SIZE 25 MM;  Surgeon: Rexene Alberts, MD;  Location: [facility];  Service: Open Heart Surgery;  Laterality: N/A;  . CARDIAC VALVE REPLACEMENT N/A    Phreesia 02/07/2020  . MULTIPLE EXTRACTIONS WITH ALVEOLOPLASTY N/A 07/23/2017   Procedure: Extraction of tooth #10 with alveoloplasty and gross debridement of remaining teeth;  Surgeon: Lenn Cal, DDS;  Location: [facility];  Service: Oral Surgery;  Laterality: N/A;  . NO PAST SURGERIES    . PACEMAKER IMPLANT N/A 08/28/2017    St Jude Medical Assurity MRI conditional  dual-chamber pacemaker for symptomatic complete heart blockby Dr Rayann Heman  . TEE WITHOUT CARDIOVERSION N/A 07/16/2017   Procedure: TRANSESOPHAGEAL ECHOCARDIOGRAM (TEE);  Surgeon: Lelon Perla, MD;  Location[facility] ENDOSCOPY;  Service: Cardiovascular;  Laterality: N/A;  . TEE WITHOUT CARDIOVERSION N/A 08/23/2017   Procedure: TRANSESOPHAGEAL ECHOCARDIOGRAM (TEE);  Surgeon: Rexene Alberts, MD;  Location: [facility];  Service: Open Heart Surgery;  Laterality: N/A;  . THORACIC AORTIC ANEURYSM REPAIR N/A 08/23/2017   Procedure: THORACIC ASCENDING ANEURYSM REPAIR (AAA) USING HEMASHIELD GOLD KNITTED MICROVEL DOUBLE VELOUR VASCULAR GRAFT D: 24 MM  L: 30 CM;  Surgeon: Rexene Alberts, MD;  Location: [facility];  Service: Open Heart Surgery;  Laterality: N/A;      Current Outpatient Medications:  .  acetaminophen (TYLENOL) 325 MG  tablet, Take 650 mg by mouth every 6 (six) hours as needed for mild pain., Disp: , Rfl:  .  albuterol (PROVENTIL) (2.5 MG/3ML) 0.083% nebulizer solution, Inhale 1 vial via nebulizer every 6 hours as needed for wheezing or shortness of breath, Disp: 270 mL, Rfl: 1 .  albuterol (VENTOLIN HFA) 108 (90 Base) MCG/ACT inhaler, Inhale 2 puffs into the lungs every 6 (six) hours as needed for wheezing or shortness of breath., Disp: 8 g, Rfl: 3 .  allopurinol (ZYLOPRIM) 300 MG tablet, TAKE 1 Tablet BY  MOUTH ONCE EVERY DAY, Disp: 30 tablet, Rfl: 1 .  atorvastatin (LIPITOR) 40 MG tablet, TAKE 1 TABLET EVERY DAY, Disp: 90 tablet, Rfl: 0 .  Blood Glucose Monitoring Suppl (TRUE METRIX METER) w/Device KIT, USE AS DIRECTED, Disp: 1 kit, Rfl: 0 .  busPIRone (BUSPAR) 10 MG tablet, TAKE 1 TABLET THREE TIMES DAILY, Disp: 270 tablet, Rfl: 0 .  citalopram (CELEXA) 40 MG tablet, TAKE 1 TABLET EVERY DAY, Disp: 90 tablet, Rfl: 0 .  docusate sodium (COLACE) 100 MG capsule, Take 200 mg by mouth daily as needed for mild constipation., Disp: , Rfl:  .  gabapentin (NEURONTIN) 100 MG capsule, TAKE 1 CAPSULE TWICE DAILY AS NEEDED (Patient taking differently: Take 100 mg by mouth 2 (two) times daily.), Disp: 180 capsule, Rfl: 0 .  glipiZIDE (GLUCOTROL) 5 MG tablet, Take 1 tablet (5 mg total) by mouth 2 (two) times daily before a meal., Disp: 60 tablet, Rfl: 3 .  guaiFENesin (MUCINEX) 600 MG 12 hr tablet, Take 600 mg by mouth 2 (two) times daily., Disp: , Rfl:  .  Krill Oil 350 MG CAPS, Take 1 capsule by mouth in the morning and at bedtime. , Disp: , Rfl:  .  lisinopril (ZESTRIL) 20 MG tablet, Take 1 tablet (20 mg total) by mouth daily., Disp: 90 tablet, Rfl: 3 .  loratadine (CLARITIN) 10 MG tablet, Take 10 mg by mouth daily as needed for allergies., Disp: , Rfl:  .  metFORMIN (GLUCOPHAGE) 850 MG tablet, TAKE 1 TABLET BY MOUTH TWICE DAILY WITH A MEAL, Disp: 60 tablet, Rfl: 0 .  metolazone (ZAROXOLYN) 5 MG tablet, PRN (Patient taking  differently: Take 5 mg by mouth daily as needed (swelling). PRN), Disp: 90 tablet, Rfl: 3 .  metoprolol tartrate (LOPRESSOR) 100 MG tablet, Take 1 tablet (100 mg total) by mouth 2 (two) times daily., Disp: 180 tablet, Rfl: 3 .  potassium chloride SA (KLOR-CON) 20 MEQ tablet, TAKE 3 TABS DAILY AND TAKE AN ADDITIONAL 1 TAB ON THE DAY YOU TAKE YOUR WEEKLY METOLAZONE DOSE, Disp: 283 tablet, Rfl: 3 .  rivaroxaban (XARELTO) 20 MG TABS tablet, TAKE 1 Tablet BY MOUTH ONCE EVERY DAY WITH SUPPER, Disp: 90 tablet, Rfl: 3 .  torsemide (DEMADEX) 20 MG tablet, Take 2 tablets (40 mg total) by mouth 2 (two) times daily., Disp: 360 tablet, Rfl: 3 .  TRUE METRIX BLOOD GLUCOSE TEST test strip, TEST ONE TIME DAILY FOR DIABETES, Disp: 100 strip, Rfl: 0 .  TRUEplus Lancets 33G MISC, TEST ONE TIME DAILY FOR DIABETES, Disp: 100 each, Rfl: 0    Physical Exam: Blood pressure (!) 146/84, pulse 75, height '5\' 11"'  (1.803 m), weight (!) 215.2 kg, SpO2 96 %.    Affect appropriate Morbidly obese  HEENT: normal Neck supple with no adenopathy JVP normal no bruits no thyromegaly Lungs clear with no wheezing and good diaphragmatic motion Heart:  S1/S2 SEM through AVR  murmur, no rub, gallop or click PMI normal post sternotomy PPM under left clavicle  Abdomen: benighn, BS positve, no tenderness, no AAA no bruit.  No HSM or HJR Distal pulses intact with no bruits No edema Neuro non-focal Skin warm and dry No muscular weakness   Labs:   Lab Results  Component Value Date   WBC 15.8 (H) 07/09/2020   HGB 12.3 (L) 07/09/2020   HCT 39.7 07/09/2020   MCV 82.2 07/09/2020   PLT 302 07/09/2020    Recent Labs  Lab 07/09/20 2009  NA 132*  K 3.4*  CL 95*  CO2 25  BUN 33*  CREATININE 1.83*  CALCIUM 9.2  GLUCOSE 106*   Lab Results  Component Value Date   CKTOTAL 117 03/15/2019   TROPONINI <0.03 05/06/2018    Lab Results  Component Value Date   CHOL 123 06/10/2020   CHOL 165 01/01/2020   CHOL 179 06/18/2019    Lab Results  Component Value Date   HDL 25 (L) 06/10/2020   HDL 25 (L) 01/01/2020   HDL 28 (L) 06/18/2019   Lab Results  Component Value Date   LDLCALC 54 06/10/2020   LDLCALC 67 01/01/2020   LDLCALC 87 06/18/2019   Lab Results  Component Value Date   TRIG 281 (H) 06/10/2020   TRIG 363 (H) 01/01/2020   TRIG 320 (H) 06/18/2019   Lab Results  Component Value Date   CHOLHDL 4.9 06/10/2020   CHOLHDL 6.6 01/01/2020   CHOLHDL 6.4 06/18/2019   No results found for: LDLDIRECT    Radiology: DG Chest Portable 1 View  Result Date: 07/09/2020 CLINICAL DATA:  Chest pain, shortness of breath, chest pressure EXAM: PORTABLE CHEST 1 VIEW COMPARISON:  03/15/2019 radiograph, 04/22/2018 CT FINDINGS: Slightly increased pulmonary vascular congestion. Findings on a background of chronically coarsened interstitial and bronchitic features which are similar to prior. No convincing features of frank edema at this time. No pneumothorax or visible effusions. Cardiomegaly is similar to comparison portable radiography. Postsurgical changes related to prior CABG including intact and aligned sternotomy wires and multiple surgical clips projecting over the mediastinum. Pacer pack overlies left chest wall with intact leads terminating towards the cardiac apex and right atrium. IMPRESSION: 1. Cardiomegaly with increasing pulmonary vascular congestion compared to prior exams could reflect features of CHF albeit without frank edema at this time. 2. Chronically coarsened interstitial and bronchitic changes. 3. Prior pacer placement, sternotomy and CABG. Electronically Signed   By: Lovena Le M.D.   On: 07/09/2020 20:32    EKG: SR RBBB no acute changes rate 83 07/09/20   ASSESSMENT AND PLAN:   1. Chest Pain:  No mediastinal widening Negative troponin/D dimer ECG no acute changes No CAD prior to AVR 2019 Due to his size unable to really do non invasive testing Atypical presentation and r/o does not warrant cath   2. AVR:  With root replacement no AR murmur f/u TTE for EF and gradients last TTE 03/16/19 EF 60-65%  No gradients recorded update echo at least can make sure no effusion / AR and assess if drastic change in EF 3. OSA:  Continue CPAP 4. Anticoagulation:  History of PE and PAF on xarelto  5. DM:  Discussed low carb diet.  Target hemoglobin A1c is 6.5 or less.  Continue current medications. 6. HLD:  Continue statin  7. HTN:  Well controlled.  Continue current medications and low sodium Dash type diet.    Echo F/U Bernerd Pho F/U Dr Rayann Heman   Signed: Jenkins Rouge 07/12/2020, 10:42 AM

## 2020-07-12 NOTE — Patient Instructions (Signed)
Medication Instructions:  Your physician recommends that you continue on your current medications as directed. Please refer to the Current Medication list given to you today.  *If you need a refill on your cardiac medications before your next appointment, please call your pharmacy*   Lab Work: NONE   If you have labs (blood work) drawn today and your tests are completely normal, you will receive your results only by: Marland Kitchen MyChart Message (if you have MyChart) OR . A paper copy in the mail If you have any lab test that is abnormal or we need to change your treatment, we will call you to review the results.   Testing/Procedures: Your physician has requested that you have an echocardiogram. Echocardiography is a painless test that uses sound waves to create images of your heart. It provides your doctor with information about the size and shape of your heart and how well your heart's chambers and valves are working. This procedure takes approximately one hour. There are no restrictions for this procedure.   Follow-Up: At [facility], you and your health needs are our priority.  As part of our continuing mission to provide you with exceptional heart care, we have created designated Provider Care Teams.  These Care Teams include your primary Cardiologist (physician) and Advanced Practice Providers (APPs -  Physician Assistants and Nurse Practitioners) who all work together to provide you with the care you need, when you need it.  We recommend signing up for the patient portal called "MyChart".  Sign up information is provided on this After Visit Summary.  MyChart is used to connect with patients for Virtual Visits (Telemedicine).  Patients are able to view lab/test results, encounter notes, upcoming appointments, etc.  Non-urgent messages can be sent to your provider as well.   To learn more about what you can do with MyChart, go to [email or web address].    Your next appointment:   3  month(s)  The format for your next appointment:   In Person  Provider:   You may see Dr. Johnsie Cancel  or one of the following Advanced Practice Providers on your designated Care Team:    Bernerd Pho, PA-C   Ermalinda Barrios, PA-C     Other Instructions Thank you for choosing [facility]!

## 2020-07-12 NOTE — Addendum Note (Signed)
Addended by: Levonne Hubert on: 07/12/2020 10:49 AM   Modules accepted: Orders

## 2020-07-14 ENCOUNTER — Ambulatory Visit (INDEPENDENT_AMBULATORY_CARE_PROVIDER_SITE_OTHER): Payer: Medicare HMO | Admitting: Internal Medicine

## 2020-07-14 ENCOUNTER — Encounter: Payer: Self-pay | Admitting: Internal Medicine

## 2020-07-14 ENCOUNTER — Other Ambulatory Visit: Payer: Self-pay

## 2020-07-14 VITALS — BP 134/74 | HR 72 | Resp 18 | Ht 71.0 in | Wt >= 6400 oz

## 2020-07-14 DIAGNOSIS — Z09 Encounter for follow-up examination after completed treatment for conditions other than malignant neoplasm: Secondary | ICD-10-CM | POA: Diagnosis not present

## 2020-07-14 DIAGNOSIS — R079 Chest pain, unspecified: Secondary | ICD-10-CM | POA: Diagnosis not present

## 2020-07-14 DIAGNOSIS — E1169 Type 2 diabetes mellitus with other specified complication: Secondary | ICD-10-CM

## 2020-07-14 DIAGNOSIS — K219 Gastro-esophageal reflux disease without esophagitis: Secondary | ICD-10-CM

## 2020-07-14 MED ORDER — PANTOPRAZOLE SODIUM 40 MG PO TBEC: 40.0000 mg | DELAYED_RELEASE_TABLET | Freq: Every day | ORAL | 3 refills | Status: DC

## 2020-07-14 NOTE — Assessment & Plan Note (Signed)
ER chart including labs and imaging reviewed Medications reconciled and reviewed with the patient

## 2020-07-14 NOTE — Progress Notes (Signed)
Established Patient Office Visit  Subjective:  Patient ID: Reginald [name], male    DOB: 14-Nov-1970  Age: 50 y.o. MRN: [id number]  CC:  Chief Complaint  Patient presents with   Follow-up    ER follow up went to [facility] 07-09-20 for chest pain ever since then has been having pain in his back and down his side did see cardiology but didn't care for him     HPI Reginald [name] a 50 year old male with past medical history of hypertension, aortic stenosis s/p aortic valve replacement and repair of ascending thoracic aortic aneurysm, paroxysmal atrial fibrillation, chronic CHF, h/o pacemaker (complete heart block), OSA on CPAP, COPD and depression who presents for follow up after an ER visit for chest pain.  Patient had episode of chest pain, in the center of the chest, which was pressure-like sensation while he was sitting in his truck.  ER work-up including EKG, BNP and D-dimer was unremarkable.  He was advised to follow-up with cardiology in the outpatient setting.  He is planned to get an echo.  He currently denies chest pain, dyspnea or palpitations.  He has brought papers for patient assistance program for diabetic medications.  He has been taking Metformin, glipizide and Januvia currently.  Plan was to switch to Rybelsus from [place].  But he could not get the paperwork done by now.  Past Medical History:  Diagnosis Date   Anxiety    Aortic stenosis    Arthritis    Asthma    Back pain    Cellulitis of skin with lymphangitis    CHF (congestive heart failure) (HCC)    Chronic diastolic congestive heart failure (HCC)    Chronic venous insufficiency    Constipation    COPD (chronic obstructive pulmonary disease) (HCC)    Depression    Dyspnea    Essential hypertension    Gout    Heart murmur    Hypertension    Morbid obesity ([place])    Obesity    Pneumonia    walking pneumonia   Pre-diabetes    Prostatitis    Pulmonary embolism ([place])    Pulmonary  hypertension (HCC)    Pyelonephritis    S/P aortic valve replacement with bioprosthetic valve 08/23/2017   25 mm Edwards Inspiris Resilia stented bovine pericardial tissue valve   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   24 mm Hemashield supracoronary straight graft    Sleep apnea    cpap    Thoracic ascending aortic aneurysm (HCC)    Thoracic ascending aortic aneurysm (HCC)    Tobacco abuse     Past Surgical History:  Procedure Laterality Date   AORTIC VALVE REPLACEMENT N/A 08/23/2017   Procedure: AORTIC VALVE REPLACEMENT (AVR) USING INSPIRIS RESILIA AORTIC VALVE SIZE 25 MM;  Surgeon: Rexene Alberts, MD;  Location: [place];  Service: Open Heart Surgery;  Laterality: N/A;   CARDIAC VALVE REPLACEMENT N/A    [employer]rees[employer] 02/07/2020   MULTIPLE EXTRACTIONS WITH ALVEOLOPLASTY N/A 07/23/2017   Procedure: Extraction of tooth #10 with alveoloplasty and gross debridement of remaining teeth;  Surgeon: Lenn Cal, DDS;  Location: [place];  Service: Oral Surgery;  Laterality: N/A;   NO PAST SURGERIES     PACEMAKER IMPLANT N/A 08/28/2017    St Jude Medical Assurity MRI conditional  dual-chamber pacemaker for symptomatic complete heart blockby Dr Rayann Heman   TEE WITHOUT CARDIOVERSION N/A 07/16/2017   Procedure: TRANSESOPHAGEAL ECHOCARDIOGRAM (TEE);  Surgeon: Lelon Perla, MD;  Location:  [facility];  Service: Cardiovascular;  Laterality: N/A;   TEE WITHOUT CARDIOVERSION N/A 08/23/2017   Procedure: TRANSESOPHAGEAL ECHOCARDIOGRAM (TEE);  Surgeon: Rexene Alberts, MD;  Location: [place];  Service: Open Heart Surgery;  Laterality: N/A;   THORACIC AORTIC ANEURYSM REPAIR N/A 08/23/2017   Procedure: THORACIC ASCENDING ANEURYSM REPAIR (AAA) USING HEMASHIELD GOLD KNITTED MICROVEL DOUBLE VELOUR VASCULAR GRAFT D: 24 MM  L: 30 CM;  Surgeon: Rexene Alberts, MD;  Location: [place];  Service: Open Heart Surgery;  Laterality: N/A;    Family History  Problem Relation Age of Onset   Hypertension Mother     Alzheimer's disease Mother    Heart attack Brother    Diabetes Brother     Social History   Socioeconomic History   Marital status: Divorced    Spouse name: Not on file   Number of children: Not on file   Years of education: Not on file   Highest education level: Not on file  Occupational History   Not on file  Tobacco Use   Smoking status: Former Smoker    Packs/day: 2.00    Years: 16.00    Pack years: 32.00    Types: Cigarettes    Quit date: 08/23/2017    Years since quitting: 2.8   Smokeless tobacco: Never Used  Vaping Use   Vaping Use: Never used  Substance and Sexual Activity   Alcohol use: No    Comment: have had alcohol in the past, not heavy   Drug use: No   Sexual activity: Not on file  Other Topics Concern   Not on file  Social History Narrative   Not on file   Social Determinants of Health   Financial Resource Strain: Low Risk    Difficulty of Paying Living Expenses: Not very hard  Food Insecurity: No Food Insecurity   Worried About Charity fundraiser in the Last Year: Never true   [place] in the Last Year: Never true  Transportation Needs: No Transportation Needs   Lack of Transportation (Medical): No   Lack of Transportation (Non-Medical): No  Physical Activity: Inactive   Days of Exercise per Week: 0 days   Minutes of Exercise per Session: 0 min  Stress: Stress Concern Present   Feeling of Stress : Rather much  Social Connections: Socially Isolated   Frequency of Communication with Friends and Family: More than three times a week   Frequency of Social Gatherings with Friends and Family: Once a week   Attends Religious Services: Never   Marine scientist or Organizations: No   Attends Music therapist: Never   Marital Status: Divorced  Human resources officer Violence: Not At Risk   Fear of Current or Ex-Partner: No   Emotionally Abused: No   Physically Abused: No   Sexually Abused: No     Outpatient Medications Prior to Visit  Medication Sig Dispense Refill   acetaminophen (TYLENOL) 325 MG tablet Take 650 mg by mouth every 6 (six) hours as needed for mild pain.     albuterol (PROVENTIL) (2.5 MG/3ML) 0.083% nebulizer solution Inhale 1 vial via nebulizer every 6 hours as needed for wheezing or shortness of breath 270 mL 1   albuterol (VENTOLIN HFA) 108 (90 Base) MCG/ACT inhaler Inhale 2 puffs into the lungs every 6 (six) hours as needed for wheezing or shortness of breath. 8 g 3   allopurinol (ZYLOPRIM) 300 MG tablet TAKE 1 Tablet BY MOUTH ONCE EVERY DAY  30 tablet 1   atorvastatin (LIPITOR) 40 MG tablet TAKE 1 TABLET EVERY DAY 90 tablet 0   Blood Glucose Monitoring Suppl (TRUE METRIX METER) w/Device KIT USE AS DIRECTED 1 kit 0   busPIRone (BUSPAR) 10 MG tablet TAKE 1 TABLET THREE TIMES DAILY 270 tablet 0   citalopram (CELEXA) 40 MG tablet TAKE 1 TABLET EVERY DAY 90 tablet 0   docusate sodium (COLACE) 100 MG capsule Take 200 mg by mouth daily as needed for mild constipation.     gabapentin (NEURONTIN) 100 MG capsule TAKE 1 CAPSULE TWICE DAILY AS NEEDED (Patient taking differently: Take 100 mg by mouth 2 (two) times daily.) 180 capsule 0   glipiZIDE (GLUCOTROL) 5 MG tablet Take 1 tablet (5 mg total) by mouth 2 (two) times daily before a meal. 60 tablet 3   guaiFENesin (MUCINEX) 600 MG 12 hr tablet Take 600 mg by mouth 2 (two) times daily.     Krill Oil 350 MG CAPS Take 1 capsule by mouth in the morning and at bedtime.      lisinopril (ZESTRIL) 20 MG tablet Take 1 tablet (20 mg total) by mouth daily. 90 tablet 3   loratadine (CLARITIN) 10 MG tablet Take 10 mg by mouth daily as needed for allergies.     metFORMIN (GLUCOPHAGE) 850 MG tablet TAKE 1 TABLET BY MOUTH TWICE DAILY WITH A MEAL 60 tablet 0   metolazone (ZAROXOLYN) 5 MG tablet PRN (Patient taking differently: Take 5 mg by mouth daily as needed (swelling). PRN) 90 tablet 3   metoprolol tartrate (LOPRESSOR)  100 MG tablet Take 1 tablet (100 mg total) by mouth 2 (two) times daily. 180 tablet 3   potassium chloride SA (KLOR-CON) 20 MEQ tablet TAKE 3 TABS DAILY AND TAKE AN ADDITIONAL 1 TAB ON THE DAY YOU TAKE YOUR WEEKLY METOLAZONE DOSE 283 tablet 3   rivaroxaban (XARELTO) 20 MG TABS tablet TAKE 1 Tablet BY MOUTH ONCE EVERY DAY WITH SUPPER 90 tablet 3   torsemide (DEMADEX) 20 MG tablet Take 2 tablets (40 mg total) by mouth 2 (two) times daily. 360 tablet 3   TRUE METRIX BLOOD GLUCOSE TEST test strip TEST ONE TIME DAILY FOR DIABETES 100 strip 0   TRUEplus Lancets 33G MISC TEST ONE TIME DAILY FOR DIABETES 100 each 0   No facility-administered medications prior to visit.    No Known Allergies  ROS Review of Systems  Constitutional: Negative for chills and fever.  HENT: Negative for congestion, sinus pressure, sinus pain and sore throat.   Eyes: Negative for pain and discharge.  Respiratory: Negative for cough and shortness of breath.   Cardiovascular: Negative for chest pain and palpitations.  Gastrointestinal: Negative for constipation, diarrhea, nausea and vomiting.  Endocrine: Negative for polydipsia and polyuria.  Genitourinary: Negative for dysuria and hematuria.  Musculoskeletal: Positive for back pain. Negative for neck pain and neck stiffness.  Skin: Negative for rash.  Neurological: Negative for dizziness, weakness and numbness.  Psychiatric/Behavioral: Positive for sleep disturbance. Negative for agitation and behavioral problems. The patient is nervous/anxious.       Objective:    Physical Exam Vitals reviewed.  Constitutional:      General: He is not in acute distress.    Appearance: He is obese. He is not diaphoretic.  HENT:     Head: Normocephalic and atraumatic.     Nose: Nose normal.     Mouth/Throat:     Mouth: Mucous membranes are moist.  Eyes:  General: No scleral icterus.    Extraocular Movements: Extraocular movements intact.     Pupils: Pupils are  equal, round, and reactive to light.  Cardiovascular:     Rate and Rhythm: Normal rate and regular rhythm.     Heart sounds: Murmur (Systolic, most pronounced in right upper sternal border) heard.    Pulmonary:     Breath sounds: Normal breath sounds. No wheezing or rales.  Abdominal:     Palpations: Abdomen is soft.     Tenderness: There is no abdominal tenderness. There is no right CVA tenderness, left CVA tenderness, guarding or rebound.  Musculoskeletal:     Cervical back: Neck supple. No rigidity or tenderness.     Right lower leg: No edema.     Left lower leg: No edema.  Skin:    General: Skin is warm.     Findings: No rash.  Neurological:     General: No focal deficit present.     Mental Status: He is alert and oriented to person, place, and time.     Sensory: No sensory deficit.     Motor: No weakness.  Psychiatric:        Mood and Affect: Mood normal.        Behavior: Behavior normal.     BP 134/74 (BP Location: Right Arm, Patient Position: Sitting, Cuff Size: Normal)    Pulse 72    Resp 18    Ht $R'5\' 11"'WW$  (1.803 m)    Wt (!) 489 lb (221.8 kg)    SpO2 96%    BMI 68.20 kg/m  Wt Readings from Last 3 Encounters:  07/14/20 (!) 489 lb (221.8 kg)  07/12/20 (!) 474 lb 8 oz (215.2 kg)  07/09/20 (!) 470 lb (213.2 kg)     Health Maintenance Due  Topic Date Due   Hepatitis C Screening  Never done   OPHTHALMOLOGY EXAM  Never done   TETANUS/TDAP  Never done   COLONOSCOPY (Pts 45-28yrs Insurance coverage will need to be confirmed)  Never done   FOOT EXAM  06/23/2020    There are no preventive care reminders to display for this patient.  Lab Results  Component Value Date   TSH 5.240 (H) 06/10/2020   Lab Results  Component Value Date   WBC 15.8 (H) 07/09/2020   HGB 12.3 (L) 07/09/2020   HCT 39.7 07/09/2020   MCV 82.2 07/09/2020   PLT 302 07/09/2020   Lab Results  Component Value Date   NA 132 (L) 07/09/2020   K 3.4 (L) 07/09/2020   CO2 25 07/09/2020    GLUCOSE 106 (H) 07/09/2020   BUN 33 (H) 07/09/2020   CREATININE 1.83 (H) 07/09/2020   BILITOT 0.8 06/10/2020   ALKPHOS 99 06/10/2020   AST 59 (H) 06/10/2020   ALT 46 (H) 06/10/2020   PROT 7.8 06/10/2020   ALBUMIN 4.1 06/10/2020   CALCIUM 9.2 07/09/2020   ANIONGAP 12 07/09/2020   Lab Results  Component Value Date   CHOL 123 06/10/2020   Lab Results  Component Value Date   HDL 25 (L) 06/10/2020   Lab Results  Component Value Date   LDLCALC 54 06/10/2020   Lab Results  Component Value Date   TRIG 281 (H) 06/10/2020   Lab Results  Component Value Date   CHOLHDL 4.9 06/10/2020   Lab Results  Component Value Date   HGBA1C 7.3 (H) 06/10/2020      Assessment & Plan:   Problem List Items Addressed This  Visit      Digestive   Gastroesophageal reflux disease    Started Pantoprazole      Relevant Medications   pantoprazole (PROTONIX) 40 MG tablet     Endocrine   DM2 (diabetes mellitus, type 2) (HCC)    HbA1C: 7.3 With peripheral neuropathy On Metformin and glipizide currently Home blood glucose in AM ranges between 140-180 Could not get Rybelsus, paperwork for patient assistance program done. On Atorvastatin 40 mg QD Check CMP and HbA1C        Other   Chest pain    EKG in the ER did not show any signs of active ischemia Cardiology evaluation undergoing Less likely to be related to cardiac etiology Started pantoprazole for GERD Needs a CPAP device for OSA      Encounter for examination following treatment at hospital - Primary    ER chart including labs and imaging reviewed Medications reconciled and reviewed with the patient         Meds ordered this encounter  Medications   pantoprazole (PROTONIX) 40 MG tablet    Sig: Take 1 tablet (40 mg total) by mouth daily.    Dispense:  90 tablet    Refill:  3    Follow-up: Return if symptoms worsen or fail to improve.    [name], MD

## 2020-07-14 NOTE — Assessment & Plan Note (Signed)
HbA1C: 7.3 With peripheral neuropathy On Metformin and glipizide currently Home blood glucose in AM ranges between 140-180 Could not get Rybelsus, paperwork for patient assistance program done. On Atorvastatin 40 mg QD Check CMP and HbA1C

## 2020-07-14 NOTE — Patient Instructions (Signed)
Please continue to take medications as prescribed.  Please continue to follow up Cardiologist as scheduled.  Continue to follow low carbohydrate and low sodium diet.

## 2020-07-14 NOTE — Assessment & Plan Note (Signed)
Started Pantoprazole 

## 2020-07-14 NOTE — Assessment & Plan Note (Signed)
EKG in the ER did not show any signs of active ischemia Cardiology evaluation undergoing Less likely to be related to cardiac etiology Started pantoprazole for GERD Needs a CPAP device for OSA

## 2020-07-16 ENCOUNTER — Telehealth: Payer: Self-pay

## 2020-07-16 NOTE — Telephone Encounter (Signed)
This will be sent to [employer]

## 2020-07-16 NOTE — Telephone Encounter (Signed)
Patient called asking if Dr Reginald [name] has done any paperwork for a mobility scooter that they discuss in his last appointment.

## 2020-07-26 ENCOUNTER — Other Ambulatory Visit: Payer: Self-pay | Admitting: Internal Medicine

## 2020-07-26 DIAGNOSIS — G609 Hereditary and idiopathic neuropathy, unspecified: Secondary | ICD-10-CM

## 2020-07-28 ENCOUNTER — Telehealth: Payer: Self-pay

## 2020-07-28 ENCOUNTER — Ambulatory Visit (HOSPITAL_COMMUNITY)
Admission: RE | Admit: 2020-07-28 | Discharge: 2020-07-28 | Disposition: A | Discharge status: Home / Self Care | Payer: Medicare HMO | Source: Ambulatory Visit | Attending: Cardiovascular Disease | Admitting: Cardiovascular Disease

## 2020-07-28 ENCOUNTER — Other Ambulatory Visit: Payer: Self-pay

## 2020-07-28 DIAGNOSIS — Z952 Presence of prosthetic heart valve: Secondary | ICD-10-CM | POA: Diagnosis not present

## 2020-07-28 LAB — ECHOCARDIOGRAM COMPLETE
AR max vel: 0.59 cm2
AV Area VTI: 0.59 cm2
AV Area mean vel: 0.64 cm2
AV Mean grad: 26 mmHg
AV Peak grad: 44.6 mmHg
Ao pk vel: 3.34 m/s
Area-P 1/2: 3.08 cm2
S' Lateral: 3.7 cm

## 2020-07-28 MED ORDER — PERFLUTREN LIPID MICROSPHERE
1.0000 mL | INTRAVENOUS | Status: AC | PRN
  Administered 2020-07-28: 2 mL via INTRAVENOUS
  Filled 2020-07-28: qty 10

## 2020-07-28 NOTE — Progress Notes (Addendum)
*  PRELIMINARY RESULTS* Echocardiogram 2D Echocardiogram has been performed with [facility].  Samuel Germany 07/28/2020, 11:44 AM

## 2020-07-28 NOTE — Telephone Encounter (Signed)
-----   Message from Josue Hector, MD sent at 07/28/2020  3:47 PM EST ----- EF normal AVR seems stable with no AR and gradients similar to 2019

## 2020-07-28 NOTE — Telephone Encounter (Signed)
Patient notified of results and voiced understanding. Patient had no questions or concerns at this time.

## 2020-08-04 ENCOUNTER — Other Ambulatory Visit: Payer: Self-pay | Admitting: Internal Medicine

## 2020-08-05 ENCOUNTER — Other Ambulatory Visit: Payer: Self-pay | Admitting: Internal Medicine

## 2020-08-05 DIAGNOSIS — F419 Anxiety disorder, unspecified: Secondary | ICD-10-CM

## 2020-08-08 ENCOUNTER — Other Ambulatory Visit: Payer: Self-pay | Admitting: Internal Medicine

## 2020-08-11 ENCOUNTER — Other Ambulatory Visit: Payer: Self-pay | Admitting: Family Medicine

## 2020-08-18 ENCOUNTER — Other Ambulatory Visit: Payer: Self-pay | Admitting: Internal Medicine

## 2020-08-20 ENCOUNTER — Ambulatory Visit: Payer: Medicare HMO | Admitting: Student

## 2020-08-25 ENCOUNTER — Ambulatory Visit (INDEPENDENT_AMBULATORY_CARE_PROVIDER_SITE_OTHER): Payer: Medicare HMO

## 2020-08-25 DIAGNOSIS — I480 Paroxysmal atrial fibrillation: Secondary | ICD-10-CM | POA: Diagnosis not present

## 2020-08-27 ENCOUNTER — Other Ambulatory Visit: Payer: Self-pay | Admitting: Family Medicine

## 2020-08-27 DIAGNOSIS — M1A9XX0 Chronic gout, unspecified, without tophus (tophi): Secondary | ICD-10-CM

## 2020-08-27 LAB — CUP PACEART REMOTE DEVICE CHECK
Battery Remaining Longevity: 126 mo
Battery Remaining Percentage: 95.5 %
Battery Voltage: 3.02 V
Brady Statistic AP VP Percent: 1 %
Brady Statistic AP VS Percent: 1.9 %
Brady Statistic AS VP Percent: 2.5 %
Brady Statistic AS VS Percent: 96 %
Brady Statistic RA Percent Paced: 1.8 %
Brady Statistic RV Percent Paced: 2.5 %
Date Time Interrogation Session: 20220406041003
Implantable Lead Implant Date: 20190409
Implantable Lead Implant Date: 20190409
Implantable Lead Location: 753859
Implantable Lead Location: 753860
Implantable Pulse Generator Implant Date: 20190409
Lead Channel Impedance Value: 510 Ohm
Lead Channel Impedance Value: 540 Ohm
Lead Channel Pacing Threshold Amplitude: 0.5 V
Lead Channel Pacing Threshold Amplitude: 0.75 V
Lead Channel Pacing Threshold Pulse Width: 0.5 ms
Lead Channel Pacing Threshold Pulse Width: 0.5 ms
Lead Channel Sensing Intrinsic Amplitude: 5 mV
Lead Channel Sensing Intrinsic Amplitude: 5 mV
Lead Channel Setting Pacing Amplitude: 2 V
Lead Channel Setting Pacing Amplitude: 2.5 V
Lead Channel Setting Pacing Pulse Width: 0.5 ms
Lead Channel Setting Sensing Sensitivity: 2 mV
Pulse Gen Model: 2272
Pulse Gen Serial Number: 9010865

## 2020-09-06 NOTE — Progress Notes (Signed)
Remote pacemaker transmission.   

## 2020-09-19 ENCOUNTER — Other Ambulatory Visit: Payer: Self-pay | Admitting: Internal Medicine

## 2020-09-19 DIAGNOSIS — E1159 Type 2 diabetes mellitus with other circulatory complications: Secondary | ICD-10-CM

## 2020-09-29 DIAGNOSIS — H5210 Myopia, unspecified eye: Secondary | ICD-10-CM | POA: Diagnosis not present

## 2020-09-29 DIAGNOSIS — Z0100 Encounter for examination of eyes and vision without abnormal findings: Secondary | ICD-10-CM | POA: Diagnosis not present

## 2020-10-01 ENCOUNTER — Ambulatory Visit (INDEPENDENT_AMBULATORY_CARE_PROVIDER_SITE_OTHER): Payer: Medicare HMO | Admitting: Internal Medicine

## 2020-10-01 ENCOUNTER — Other Ambulatory Visit: Payer: Self-pay

## 2020-10-01 ENCOUNTER — Encounter: Payer: Self-pay | Admitting: Internal Medicine

## 2020-10-01 VITALS — BP 104/56 | HR 71 | Resp 18 | Ht 71.0 in | Wt >= 6400 oz

## 2020-10-01 DIAGNOSIS — B351 Tinea unguium: Secondary | ICD-10-CM

## 2020-10-01 DIAGNOSIS — G609 Hereditary and idiopathic neuropathy, unspecified: Secondary | ICD-10-CM

## 2020-10-01 DIAGNOSIS — I872 Venous insufficiency (chronic) (peripheral): Secondary | ICD-10-CM

## 2020-10-01 DIAGNOSIS — Z1159 Encounter for screening for other viral diseases: Secondary | ICD-10-CM | POA: Diagnosis not present

## 2020-10-01 DIAGNOSIS — E1142 Type 2 diabetes mellitus with diabetic polyneuropathy: Secondary | ICD-10-CM | POA: Diagnosis not present

## 2020-10-01 DIAGNOSIS — E1169 Type 2 diabetes mellitus with other specified complication: Secondary | ICD-10-CM | POA: Diagnosis not present

## 2020-10-01 MED ORDER — TERBINAFINE HCL 250 MG PO TABS
250.0000 mg | ORAL_TABLET | Freq: Every day | ORAL | 2 refills | Status: DC
Start: 2020-10-01 — End: 2020-12-21

## 2020-10-01 MED ORDER — GABAPENTIN 100 MG PO CAPS: ORAL_CAPSULE | ORAL | 1 refills | Status: DC

## 2020-10-01 MED ORDER — MUPIROCIN 2 % EX OINT: 1.0000 "application " | TOPICAL_OINTMENT | Freq: Two times a day (BID) | CUTANEOUS | 0 refills | Status: DC

## 2020-10-01 NOTE — Progress Notes (Signed)
Acute Office Visit  Subjective:    Patient ID: Reginald [name], male    DOB: 02/11/1971, 50 y.o.   MRN: [id number]  Chief Complaint  Patient presents with  . Leg Swelling    Both legs sweliing red and leaking fluid he did take tylenol last night and they arent as red now     HPI Patient is in today for evaluation of b/l leg swelling. He states that he noticed redness over b/l legs and had weeping from his right leg yesterday, which has resolved today. He tried applying Neosporin, which has resolved redness. Denies any fever, chills, or recent injury.  He also reports that he has been having b/l hand and feet numbness intermittently despite taking Gabapentin.  Past Medical History:  Diagnosis Date  . Anxiety   . Aortic stenosis   . Arthritis   . Asthma   . Back pain   . Cellulitis of skin with lymphangitis   . CHF (congestive heart failure) ([place])   . Chronic diastolic congestive heart failure ([place])   . Chronic venous insufficiency   . Constipation   . COPD (chronic obstructive pulmonary disease) ([place])   . Depression   . Dyspnea   . Essential hypertension   . Gout   . Heart murmur   . Hypertension   . Morbid obesity ([place])   . Obesity   . Pneumonia    walking pneumonia  . Pre-diabetes   . Prostatitis   . Pulmonary embolism ([place])   . Pulmonary hypertension ([place])   . Pyelonephritis   . S/P aortic valve replacement with bioprosthetic valve 08/23/2017   25 mm Edwards Inspiris Resilia stented bovine pericardial tissue valve  . 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 Sleep apnea    cpap   . Thoracic ascending aortic aneurysm ([place])   . Thoracic ascending aortic aneurysm ([place])   . Tobacco abuse     Past Surgical History:  Procedure Laterality Date  . AORTIC VALVE REPLACEMENT N/A 08/23/2017   Procedure: AORTIC VALVE REPLACEMENT (AVR) USING INSPIRIS RESILIA AORTIC VALVE SIZE 25 MM;  Surgeon: Rexene Alberts, MD;   Location: [place];  Service: Open Heart Surgery;  Laterality: N/A;  . CARDIAC VALVE REPLACEMENT N/A    Phreesia 02/07/2020  . MULTIPLE EXTRACTIONS WITH ALVEOLOPLASTY N/A 07/23/2017   Procedure: Extraction of tooth #10 with alveoloplasty and gross debridement of remaining teeth;  Surgeon: Lenn Cal, DDS;  Location: [facility];  Service: Oral Surgery;  Laterality: N/A;  . NO PAST SURGERIES    . PACEMAKER IMPLANT N/A 08/28/2017    St Jude Medical Assurity MRI conditional  dual-chamber pacemaker for symptomatic complete heart blockby Dr Rayann Heman  . TEE WITHOUT CARDIOVERSION N/A 07/16/2017   Procedure: TRANSESOPHAGEAL ECHOCARDIOGRAM (TEE);  Surgeon: Lelon Perla, MD;  Location: [facility] ENDOSCOPY;  Service: Cardiovascular;  Laterality: N/A;  . TEE WITHOUT CARDIOVERSION N/A 08/23/2017   Procedure: TRANSESOPHAGEAL ECHOCARDIOGRAM (TEE);  Surgeon: Rexene Alberts, MD;  Location: [facility];  Service: Open Heart Surgery;  Laterality: N/A;  . THORACIC AORTIC ANEURYSM REPAIR N/A 08/23/2017   Procedure: THORACIC ASCENDING ANEURYSM REPAIR (AAA) USING HEMASHIELD GOLD KNITTED MICROVEL DOUBLE VELOUR VASCULAR GRAFT D: 24 MM  L: 30 CM;  Surgeon: Rexene Alberts, MD;  Location: [facility];  Service: Open Heart Surgery;  Laterality: N/A;    Family History  Problem Relation Age of Onset  . Hypertension Mother   . Alzheimer's  disease Mother   . Heart attack Brother   . Diabetes Brother     Social History   Socioeconomic History  . Marital status: Divorced    Spouse name: Not on file  . Number of children: Not on file  . Years of education: Not on file  . Highest education level: Not on file  Occupational History  . Not on file  Tobacco Use  . Smoking status: Former Smoker    Packs/day: 2.00    Years: 16.00    Pack years: 32.00    Types: Cigarettes    Quit date: 08/23/2017    Years since quitting: 3.1  . Smokeless tobacco: Never Used  Vaping Use  . Vaping Use: Never used  Substance and Sexual Activity  . Alcohol  use: No    Comment: have had alcohol in the past, not heavy  . Drug use: No  . Sexual activity: Not on file  Other Topics Concern  . Not on file  Social History Narrative  . Not on file   Social Determinants of Health   Financial Resource Strain: Low Risk   . Difficulty of Paying Living Expenses: Not very hard  Food Insecurity: No Food Insecurity  . Worried About Charity fundraiser in the Last Year: Never true  . Ran Out of Food in the Last Year: Never true  Transportation Needs: No Transportation Needs  . Lack of Transportation (Medical): No  . Lack of Transportation (Non-Medical): No  Physical Activity: Inactive  . Days of Exercise per Week: 0 days  . Minutes of Exercise per Session: 0 min  Stress: Stress Concern Present  . Feeling of Stress : Rather much  Social Connections: Socially Isolated  . Frequency of Communication with Friends and Family: More than three times a week  . Frequency of Social Gatherings with Friends and Family: Once a week  . Attends Religious Services: Never  . Active Member of Clubs or Organizations: No  . Attends Archivist Meetings: Never  . Marital Status: Divorced  Human resources officer Violence: Not At Risk  . Fear of Current or Ex-Partner: No  . Emotionally Abused: No  . Physically Abused: No  . Sexually Abused: No    Outpatient Medications Prior to Visit  Medication Sig Dispense Refill  . acetaminophen (TYLENOL) 325 MG tablet Take 650 mg by mouth every 6 (six) hours as needed for mild pain.    Marland Kitchen albuterol (PROVENTIL) (2.5 MG/3ML) 0.083% nebulizer solution INHALE 1 VIAL VIA NEBULIZER EVERY 6 HOURS AS NEEDED FOR WHEEZING OR SHORTNESS OF BREATH 270 mL 1  . albuterol (VENTOLIN HFA) 108 (90 Base) MCG/ACT inhaler Inhale 2 puffs into the lungs every 6 (six) hours as needed for wheezing or shortness of breath. 8 g 3  . allopurinol (ZYLOPRIM) 300 MG tablet TAKE 1 TABLET EVERY DAY 90 tablet 0  . atorvastatin (LIPITOR) 40 MG tablet TAKE 1  TABLET EVERY DAY 90 tablet 0  . Blood Glucose Monitoring Suppl (TRUE METRIX METER) w/Device KIT USE AS DIRECTED 1 kit 0  . busPIRone (BUSPAR) 10 MG tablet TAKE 1 TABLET THREE TIMES DAILY 270 tablet 0  . citalopram (CELEXA) 40 MG tablet TAKE 1 TABLET EVERY DAY 90 tablet 0  . docusate sodium (COLACE) 100 MG capsule Take 200 mg by mouth daily as needed for mild constipation.    . fexofenadine (ALLEGRA ALLERGY) 180 MG tablet Take 180 mg by mouth daily.    Marland Kitchen glipiZIDE (GLUCOTROL) 5 MG tablet Take  1 tablet (5 mg total) by mouth 2 (two) times daily before a meal. 60 tablet 3  . guaiFENesin (MUCINEX) 600 MG 12 hr tablet Take 600 mg by mouth 2 (two) times daily.    Javier Docker Oil 350 MG CAPS Take 1 capsule by mouth in the morning and at bedtime.     . metFORMIN (GLUCOPHAGE) 850 MG tablet TAKE 1 TABLET (850 MG TOTAL) BY MOUTH 2 (TWO) TIMES DAILY WITH A MEAL. 180 tablet 2  . metolazone (ZAROXOLYN) 5 MG tablet PRN (Patient taking differently: Take 5 mg by mouth daily as needed (swelling). PRN) 90 tablet 3  . metoprolol tartrate (LOPRESSOR) 100 MG tablet Take 1 tablet (100 mg total) by mouth 2 (two) times daily. 180 tablet 3  . pantoprazole (PROTONIX) 40 MG tablet Take 1 tablet (40 mg total) by mouth daily. 90 tablet 3  . potassium chloride SA (KLOR-CON) 20 MEQ tablet TAKE 3 TABS DAILY AND TAKE AN ADDITIONAL 1 TAB ON THE DAY YOU TAKE YOUR WEEKLY METOLAZONE DOSE 283 tablet 3  . rivaroxaban (XARELTO) 20 MG TABS tablet TAKE 1 Tablet BY MOUTH ONCE EVERY DAY WITH SUPPER 90 tablet 3  . TRUE METRIX BLOOD GLUCOSE TEST test strip TEST ONE TIME DAILY FOR DIABETES 100 strip 0  . TRUEplus Lancets 33G MISC TEST ONE TIME DAILY FOR DIABETES 100 each 0  . gabapentin (NEURONTIN) 100 MG capsule Take 1 capsule (100 mg total) by mouth 2 (two) times daily. 180 capsule 0  . lisinopril (ZESTRIL) 20 MG tablet Take 1 tablet (20 mg total) by mouth daily. 90 tablet 3  . loratadine (CLARITIN) 10 MG tablet Take 10 mg by mouth daily as needed  for allergies. (Patient not taking: Reported on 10/01/2020)    . torsemide (DEMADEX) 20 MG tablet Take 2 tablets (40 mg total) by mouth 2 (two) times daily. 360 tablet 3   No facility-administered medications prior to visit.    No Known Allergies  Review of Systems  Constitutional: Negative for chills and fever.  HENT: Negative for congestion, sinus pressure, sinus pain and sore throat.   Eyes: Negative for pain and discharge.  Respiratory: Negative for cough and shortness of breath.   Cardiovascular: Positive for leg swelling. Negative for chest pain and palpitations.  Gastrointestinal: Negative for constipation, diarrhea, nausea and vomiting.  Endocrine: Negative for polydipsia and polyuria.  Genitourinary: Negative for dysuria and hematuria.  Musculoskeletal: Positive for back pain. Negative for neck pain and neck stiffness.  Skin: Negative for rash.  Neurological: Negative for dizziness, weakness and numbness.  Psychiatric/Behavioral: Positive for sleep disturbance. Negative for agitation and behavioral problems. The patient is nervous/anxious.        Objective:    Physical Exam Vitals reviewed.  Constitutional:      General: He is not in acute distress.    Appearance: He is obese. He is not diaphoretic.  HENT:     Head: Normocephalic and atraumatic.     Nose: Nose normal.     Mouth/Throat:     Mouth: Mucous membranes are moist.  Eyes:     General: No scleral icterus.    Extraocular Movements: Extraocular movements intact.     Pupils: Pupils are equal, round, and reactive to light.  Cardiovascular:     Rate and Rhythm: Normal rate and regular rhythm.     Heart sounds: Murmur (Systolic, most pronounced in right upper sternal border) heard.    Pulmonary:     Breath sounds: Normal breath sounds.  No wheezing or rales.  Abdominal:     Palpations: Abdomen is soft.     Tenderness: There is no abdominal tenderness. There is no right CVA tenderness, left CVA tenderness,  guarding or rebound.  Musculoskeletal:     Cervical back: Neck supple. No rigidity or tenderness.     Right lower leg: Edema (1+) present.     Left lower leg: Edema (1+) present.  Skin:    General: Skin is warm.     Findings: Rash (chronic discoloration of b/l leg, statis dermatitis) present.     Comments: Onychomycosis of toenails and fingernails  Neurological:     General: No focal deficit present.     Mental Status: He is alert and oriented to person, place, and time.     Sensory: No sensory deficit.     Motor: No weakness.  Psychiatric:        Mood and Affect: Mood normal.        Behavior: Behavior normal.     BP (!) 104/56 (BP Location: Right Arm, Patient Position: Sitting, Cuff Size: Normal)   Pulse 71   Resp 18   Ht 5' 11" (1.803 m)   Wt (!) 483 lb (219.1 kg)   SpO2 95%   BMI 67.36 kg/m  Wt Readings from Last 3 Encounters:  10/01/20 (!) 483 lb (219.1 kg)  07/14/20 (!) 489 lb (221.8 kg)  07/12/20 (!) 474 lb 8 oz (215.2 kg)    Health Maintenance Due  Topic Date Due  . OPHTHALMOLOGY EXAM  Never done  . Hepatitis C Screening  Never done  . TETANUS/TDAP  Never done  . COLONOSCOPY (Pts 45-66yr Insurance coverage will need to be confirmed)  Never done  . URINE MICROALBUMIN  09/16/2020    There are no preventive care reminders to display for this patient.   Lab Results  Component Value Date   TSH 5.240 (H) 06/10/2020   Lab Results  Component Value Date   WBC 15.8 (H) 07/09/2020   HGB 12.3 (L) 07/09/2020   HCT 39.7 07/09/2020   MCV 82.2 07/09/2020   PLT 302 07/09/2020   Lab Results  Component Value Date   NA 132 (L) 07/09/2020   K 3.4 (L) 07/09/2020   CO2 25 07/09/2020   GLUCOSE 106 (H) 07/09/2020   BUN 33 (H) 07/09/2020   CREATININE 1.83 (H) 07/09/2020   BILITOT 0.8 06/10/2020   ALKPHOS 99 06/10/2020   AST 59 (H) 06/10/2020   ALT 46 (H) 06/10/2020   PROT 7.8 06/10/2020   ALBUMIN 4.1 06/10/2020   CALCIUM 9.2 07/09/2020   ANIONGAP 12 07/09/2020    Lab Results  Component Value Date   CHOL 123 06/10/2020   Lab Results  Component Value Date   HDL 25 (L) 06/10/2020   Lab Results  Component Value Date   LDLCALC 54 06/10/2020   Lab Results  Component Value Date   TRIG 281 (H) 06/10/2020   Lab Results  Component Value Date   CHOLHDL 4.9 06/10/2020   Lab Results  Component Value Date   HGBA1C 7.3 (H) 06/10/2020       Assessment & Plan:   Problem List Items Addressed This Visit       Other Visit Diagnoses    Venous stasis dermatitis of both lower extremities     Less likely to be infectious Chronic statis dermatitis related changes Mupirocin for prophylaxis Leg elevation and compression socks for chronic edema    Relevant Medications   mupirocin  ointment (BACTROBAN) 2 %   Diabetic polyneuropathy associated with type 2 diabetes mellitus (HCC)     Increased dose of Gabapentin Referred to Podiatry for evaluation of diabetic shoes   Relevant Medications   gabapentin (NEURONTIN) 100 MG capsule   Other Relevant Orders   Ambulatory referral to Podiatry   Onychomycosis     Terbinafine for 3 months Referred to Podiatry   Relevant Medications   mupirocin ointment (BACTROBAN) 2 %   terbinafine (LAMISIL) 250 MG tablet   Other Relevant Orders   Ambulatory referral to Podiatry   Idiopathic peripheral neuropathy       Relevant Medications   gabapentin (NEURONTIN) 100 MG capsule   Need for hepatitis C screening test       Relevant Orders   Hepatitis C Antibody     Endocrine  DM2 (diabetes mellitus, type 2) ([place]) - Primary  Relevant Orders  Microalbumin, urine    Meds ordered this encounter  Medications  . mupirocin ointment (BACTROBAN) 2 %    Sig: Apply 1 application topically 2 (two) times daily.    Dispense:  22 g    Refill:  0  . terbinafine (LAMISIL) 250 MG tablet    Sig: Take 1 tablet (250 mg total) by mouth daily.    Dispense:  28 tablet    Refill:  2  . gabapentin (NEURONTIN) 100 MG capsule     Sig: Take 1 capsule in the morning and 2 capsules in the evening.    Dispense:  270 capsule    Refill:  1     Rutwik Keith Rake, MD

## 2020-10-01 NOTE — Patient Instructions (Addendum)
Please start taking Gabapentin 100 mg in morning and 200 mg at nighttime.  Please start using Mupirocin cream as prescribed on legs.  Terbinafine for nail infection.  You are being referred to Podiatry for diabetic neuropathy and fungal infection.

## 2020-10-03 LAB — MICROALBUMIN, URINE: Microalbumin, Urine: 38.4 ug/mL

## 2020-10-04 DIAGNOSIS — R945 Abnormal results of liver function studies: Secondary | ICD-10-CM | POA: Diagnosis not present

## 2020-10-04 DIAGNOSIS — I5032 Chronic diastolic (congestive) heart failure: Secondary | ICD-10-CM | POA: Diagnosis not present

## 2020-10-04 DIAGNOSIS — J449 Chronic obstructive pulmonary disease, unspecified: Secondary | ICD-10-CM | POA: Diagnosis not present

## 2020-10-04 DIAGNOSIS — I10 Essential (primary) hypertension: Secondary | ICD-10-CM | POA: Diagnosis not present

## 2020-10-04 DIAGNOSIS — R7989 Other specified abnormal findings of blood chemistry: Secondary | ICD-10-CM | POA: Diagnosis not present

## 2020-10-04 DIAGNOSIS — Z1159 Encounter for screening for other viral diseases: Secondary | ICD-10-CM | POA: Diagnosis not present

## 2020-10-04 DIAGNOSIS — E1169 Type 2 diabetes mellitus with other specified complication: Secondary | ICD-10-CM | POA: Diagnosis not present

## 2020-10-05 LAB — HEMOGLOBIN A1C
Est. average glucose Bld gHb Est-mCnc: 120 mg/dL
Hgb A1c MFr Bld: 5.8 % — ABNORMAL HIGH (ref 4.8–5.6)

## 2020-10-05 LAB — CBC WITH DIFFERENTIAL/PLATELET
Basophils Absolute: 0 10*3/uL (ref 0.0–0.2)
Basos: 0 %
EOS (ABSOLUTE): 0.3 10*3/uL (ref 0.0–0.4)
Eos: 3 %
Hematocrit: 33.7 % — ABNORMAL LOW (ref 37.5–51.0)
Hemoglobin: 10.4 g/dL — ABNORMAL LOW (ref 13.0–17.7)
Immature Grans (Abs): 0.1 10*3/uL (ref 0.0–0.1)
Immature Granulocytes: 1 %
Lymphocytes Absolute: 2.4 10*3/uL (ref 0.7–3.1)
Lymphs: 20 %
MCH: 25.1 pg — ABNORMAL LOW (ref 26.6–33.0)
MCHC: 30.9 g/dL — ABNORMAL LOW (ref 31.5–35.7)
MCV: 81 fL (ref 79–97)
Monocytes Absolute: 0.8 10*3/uL (ref 0.1–0.9)
Monocytes: 7 %
Neutrophils Absolute: 8.6 10*3/uL — ABNORMAL HIGH (ref 1.4–7.0)
Neutrophils: 69 %
Platelets: 247 10*3/uL (ref 150–450)
RBC: 4.15 x10E6/uL (ref 4.14–5.80)
RDW: 17.5 % — ABNORMAL HIGH (ref 11.6–15.4)
WBC: 12.3 10*3/uL — ABNORMAL HIGH (ref 3.4–10.8)

## 2020-10-05 LAB — TSH+FREE T4
Free T4: 1.3 ng/dL (ref 0.82–1.77)
TSH: 4.06 u[IU]/mL (ref 0.450–4.500)

## 2020-10-05 LAB — HEPATITIS C ANTIBODY: Hep C Virus Ab: <0.1 s/co ratio (ref 0.0–0.9)

## 2020-10-05 LAB — CMP14+EGFR
ALT: 21 IU/L (ref 0–44)
AST: 34 IU/L (ref 0–40)
Albumin/Globulin Ratio: 1.3 (ref 1.2–2.2)
Albumin: 4 g/dL (ref 4.0–5.0)
Alkaline Phosphatase: 86 IU/L (ref 44–121)
BUN/Creatinine Ratio: 27 — ABNORMAL HIGH (ref 9–20)
BUN: 35 mg/dL — ABNORMAL HIGH (ref 6–24)
Bilirubin Total: 0.6 mg/dL (ref 0.0–1.2)
CO2: 23 mmol/L (ref 20–29)
Calcium: 9.2 mg/dL (ref 8.7–10.2)
Chloride: 98 mmol/L (ref 96–106)
Creatinine, Ser: 1.3 mg/dL — ABNORMAL HIGH (ref 0.76–1.27)
Globulin, Total: 3.2 g/dL (ref 1.5–4.5)
Glucose: 132 mg/dL — ABNORMAL HIGH (ref 65–99)
Potassium: 4.4 mmol/L (ref 3.5–5.2)
Sodium: 139 mmol/L (ref 134–144)
Total Protein: 7.2 g/dL (ref 6.0–8.5)
eGFR: 67 mL/min/{1.73_m2} (ref >59)

## 2020-10-08 ENCOUNTER — Other Ambulatory Visit: Payer: Self-pay

## 2020-10-08 ENCOUNTER — Encounter: Payer: Self-pay | Admitting: Internal Medicine

## 2020-10-08 ENCOUNTER — Ambulatory Visit (INDEPENDENT_AMBULATORY_CARE_PROVIDER_SITE_OTHER): Payer: Medicare HMO | Admitting: Internal Medicine

## 2020-10-08 VITALS — BP 132/84 | HR 69 | Resp 18 | Ht 71.0 in | Wt >= 6400 oz

## 2020-10-08 DIAGNOSIS — M25511 Pain in right shoulder: Secondary | ICD-10-CM

## 2020-10-08 DIAGNOSIS — I10 Essential (primary) hypertension: Secondary | ICD-10-CM

## 2020-10-08 DIAGNOSIS — D509 Iron deficiency anemia, unspecified: Secondary | ICD-10-CM

## 2020-10-08 DIAGNOSIS — K922 Gastrointestinal hemorrhage, unspecified: Secondary | ICD-10-CM

## 2020-10-08 DIAGNOSIS — E1169 Type 2 diabetes mellitus with other specified complication: Secondary | ICD-10-CM

## 2020-10-08 DIAGNOSIS — G8929 Other chronic pain: Secondary | ICD-10-CM

## 2020-10-08 MED ORDER — GLIPIZIDE ER 5 MG PO TB24: 5.0000 mg | ORAL_TABLET | Freq: Every day | ORAL | 5 refills | Status: DC

## 2020-10-08 MED ORDER — METFORMIN HCL 850 MG PO TABS: 850.0000 mg | ORAL_TABLET | Freq: Two times a day (BID) | ORAL | 1 refills | Status: DC

## 2020-10-08 MED ORDER — IRON (FERROUS SULFATE) 325 (65 FE) MG PO TABS: 325.0000 mg | ORAL_TABLET | Freq: Every day | ORAL | 1 refills | Status: DC

## 2020-10-08 NOTE — Patient Instructions (Addendum)
Please start taking Glipizide 5 mg once daily as prescribed.  Please continue taking other medications as prescribed.  You are being referred to GI for evaluation of GI bleeding.

## 2020-10-08 NOTE — Assessment & Plan Note (Signed)
BP Readings from Last 1 Encounters:  10/08/20 132/84   Well-controlled with Lisinopril and Metoprolol Counseled for compliance with the medications Advised DASH diet and moderate exercise/walking, at least 150 mins/week

## 2020-10-08 NOTE — Progress Notes (Signed)
Established Patient Office Visit  Subjective:  Patient ID: Reginald [name], male    DOB: 04/12/1971  Age: 50 y.o. MRN: [id number]  CC:  Chief Complaint  Patient presents with  . Follow-up    4 month follow up legs have been better having right shoulder pain for about 4 weeks has tried tylenol and biofreeze discuss lab results     HPI Reginald [name] is a 50 year old male with past medical history of hypertension, aortic stenosis s/p aortic valve replacement and repair of ascending thoracic aortic aneurysm, paroxysmal atrial fibrillation, chronic CHF, h/o pacemaker (complete heart block), OSA on CPAP, COPD and depressionwhopresents for follow up of his chronic medical conditions.  HTN: BP is well-controlled. Takes medications regularly. Patient denies headache, dizziness, chest pain, dyspnea or palpitations.  DM: His HbA1C is 5.8 in the office today. He states that he had episode of hypoglycemia yesterday (blood glucose 67). He had dizziness at that time, which was relieved with snack. Denies any polyuria or polydipsia.  Right shoulder pain: He has been having right shoulder/pectoral area pain, which gets better with Tylenol and hearting pad. Pain is worse with coughing. Denies any overt dyspnea or chest pain currently. Denies any recent URTI.  He has been having bright red blood in stool intermittently every other week or so. His Hb is around 10.4 from 12.3 about 3 months ago. Denies any melena or diarrhea currently.  Past Medical History:  Diagnosis Date  . Anxiety   . Aortic stenosis   . Arthritis   . Asthma   . Back pain   . Cellulitis of skin with lymphangitis   . CHF (congestive heart failure) ([place])   . Chronic diastolic congestive heart failure ([place])   . Chronic venous insufficiency   . Constipation   . COPD (chronic obstructive pulmonary disease) ([place])   . Depression   . Dyspnea   . Essential hypertension   . Gout   . Heart murmur   . Hypertension   . Morbid obesity  ([place])   . Obesity   . Pneumonia    walking pneumonia  . Pre-diabetes   . Prostatitis   . Pulmonary embolism ([place])   . Pulmonary hypertension ([place])   . Pyelonephritis   . S/P aortic valve replacement with bioprosthetic valve 08/23/2017   25 mm Edwards Inspiris Resilia stented bovine pericardial tissue valve  . 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 Sleep apnea    cpap   . Thoracic ascending aortic aneurysm ([place])   . Thoracic ascending aortic aneurysm ([place])   . Tobacco abuse     Past Surgical History:  Procedure Laterality Date  . AORTIC VALVE REPLACEMENT N/A 08/23/2017   Procedure: AORTIC VALVE REPLACEMENT (AVR) USING INSPIRIS RESILIA AORTIC VALVE SIZE 25 MM;  Surgeon: Rexene Alberts, MD;  Location: [place];  Service: Open Heart Surgery;  Laterality: N/A;  . CARDIAC VALVE REPLACEMENT N/A    Phreesia 02/07/2020  . MULTIPLE EXTRACTIONS WITH ALVEOLOPLASTY N/A 07/23/2017   Procedure: Extraction of tooth #10 with alveoloplasty and gross debridement of remaining teeth;  Surgeon: Lenn Cal, DDS;  Location: [place];  Service: Oral Surgery;  Laterality: N/A;  . NO PAST SURGERIES    . PACEMAKER IMPLANT N/A 08/28/2017    St Jude Medical Assurity MRI conditional  dual-chamber pacemaker for symptomatic complete heart blockby Dr Rayann Heman  . TEE WITHOUT CARDIOVERSION N/A 07/16/2017   Procedure: TRANSESOPHAGEAL ECHOCARDIOGRAM (TEE);  Surgeon: Lelon Perla, MD;  [facility] ENDOSCOPY;  Service: Cardiovascular;  Laterality: N/A;  . TEE WITHOUT CARDIOVERSION N/A 08/23/2017   Procedure: TRANSESOPHAGEAL ECHOCARDIOGRAM (TEE);  Surgeon: Rexene Alberts, MD;  Location: [place];  Service: Open Heart Surgery;  Laterality: N/A;  . THORACIC AORTIC ANEURYSM REPAIR N/A 08/23/2017   Procedure: THORACIC ASCENDING ANEURYSM REPAIR (AAA) USING HEMASHIELD GOLD KNITTED MICROVEL DOUBLE VELOUR VASCULAR GRAFT D: 24 MM  L: 30 CM;  Surgeon: Rexene Alberts, MD;   Location: [place];  Service: Open Heart Surgery;  Laterality: N/A;    Family History  Problem Relation Age of Onset  . Hypertension Mother   . Alzheimer's disease Mother   . Heart attack Brother   . Diabetes Brother     Social History   Socioeconomic History  . Marital status: Divorced    Spouse name: Not on file  . Number of children: Not on file  . Years of education: Not on file  . Highest education level: Not on file  Occupational History  . Not on file  Tobacco Use  . Smoking status: Former Smoker    Packs/day: 2.00    Years: 16.00    Pack years: 32.00    Types: Cigarettes    Quit date: 08/23/2017    Years since quitting: 3.1  . Smokeless tobacco: Never Used  Vaping Use  . Vaping Use: Never used  Substance and Sexual Activity  . Alcohol use: No    Comment: have had alcohol in the past, not heavy  . Drug use: No  . Sexual activity: Not on file  Other Topics Concern  . Not on file  Social History Narrative  . Not on file   Social Determinants of Health   Financial Resource Strain: Low Risk   . Difficulty of Paying Living Expenses: Not very hard  Food Insecurity: No Food Insecurity  . Worried About Charity fundraiser in the Last Year: Never true  . Ran Out of Food in the Last Year: Never true  Transportation Needs: No Transportation Needs  . Lack of Transportation (Medical): No  . Lack of Transportation (Non-Medical): No  Physical Activity: Inactive  . Days of Exercise per Week: 0 days  . Minutes of Exercise per Session: 0 min  Stress: Stress Concern Present  . Feeling of Stress : Rather much  Social Connections: Socially Isolated  . Frequency of Communication with Friends and Family: More than three times a week  . Frequency of Social Gatherings with Friends and Family: Once a week  . Attends Religious Services: Never  . Active Member of Clubs or Organizations: No  . Attends Archivist Meetings: Never  . Marital Status: Divorced  Arboriculturist Violence: Not At Risk  . Fear of Current or Ex-Partner: No  . Emotionally Abused: No  . Physically Abused: No  . Sexually Abused: No    Outpatient Medications Prior to Visit  Medication Sig Dispense Refill  . acetaminophen (TYLENOL) 325 MG tablet Take 650 mg by mouth every 6 (six) hours as needed for mild pain.    Marland Kitchen albuterol (PROVENTIL) (2.5 MG/3ML) 0.083% nebulizer solution INHALE 1 VIAL VIA NEBULIZER EVERY 6 HOURS AS NEEDED FOR WHEEZING OR SHORTNESS OF BREATH 270 mL 1  . albuterol (VENTOLIN HFA) 108 (90 Base) MCG/ACT inhaler Inhale 2 puffs into the lungs every 6 (six) hours as needed for wheezing or shortness of breath. 8 g 3  . allopurinol (ZYLOPRIM) 300 MG tablet TAKE  1 TABLET EVERY DAY 90 tablet 0  . atorvastatin (LIPITOR) 40 MG tablet TAKE 1 TABLET EVERY DAY 90 tablet 0  . Blood Glucose Monitoring Suppl (TRUE METRIX METER) w/Device KIT USE AS DIRECTED 1 kit 0  . busPIRone (BUSPAR) 10 MG tablet TAKE 1 TABLET THREE TIMES DAILY 270 tablet 0  . citalopram (CELEXA) 40 MG tablet TAKE 1 TABLET EVERY DAY 90 tablet 0  . docusate sodium (COLACE) 100 MG capsule Take 200 mg by mouth daily as needed for mild constipation.    . fexofenadine (ALLEGRA) 180 MG tablet Take 180 mg by mouth daily.    Marland Kitchen gabapentin (NEURONTIN) 100 MG capsule Take 1 capsule in the morning and 2 capsules in the evening. 270 capsule 1  . guaiFENesin (MUCINEX) 600 MG 12 hr tablet Take 600 mg by mouth 2 (two) times daily.    Javier Docker Oil 350 MG CAPS Take 1 capsule by mouth in the morning and at bedtime.     Marland Kitchen loratadine (CLARITIN) 10 MG tablet Take 10 mg by mouth daily as needed for allergies.    . metolazone (ZAROXOLYN) 5 MG tablet PRN (Patient taking differently: Take 5 mg by mouth daily as needed (swelling). PRN) 90 tablet 3  . metoprolol tartrate (LOPRESSOR) 100 MG tablet Take 1 tablet (100 mg total) by mouth 2 (two) times daily. 180 tablet 3  . mupirocin ointment (BACTROBAN) 2 % Apply 1 application topically 2 (two)  times daily. 22 g 0  . pantoprazole (PROTONIX) 40 MG tablet Take 1 tablet (40 mg total) by mouth daily. 90 tablet 3  . potassium chloride SA (KLOR-CON) 20 MEQ tablet TAKE 3 TABS DAILY AND TAKE AN ADDITIONAL 1 TAB ON THE DAY YOU TAKE YOUR WEEKLY METOLAZONE DOSE 283 tablet 3  . rivaroxaban (XARELTO) 20 MG TABS tablet TAKE 1 Tablet BY MOUTH ONCE EVERY DAY WITH SUPPER 90 tablet 3  . Semaglutide 7 MG TABS Take 1 tablet by mouth daily.    Marland Kitchen terbinafine (LAMISIL) 250 MG tablet Take 1 tablet (250 mg total) by mouth daily. 28 tablet 2  . TRUE METRIX BLOOD GLUCOSE TEST test strip TEST ONE TIME DAILY FOR DIABETES 100 strip 0  . TRUEplus Lancets 33G MISC TEST ONE TIME DAILY FOR DIABETES 100 each 0  . glipiZIDE (GLUCOTROL) 5 MG tablet Take 1 tablet (5 mg total) by mouth 2 (two) times daily before a meal. 60 tablet 3  . metFORMIN (GLUCOPHAGE) 850 MG tablet TAKE 1 TABLET (850 MG TOTAL) BY MOUTH 2 (TWO) TIMES DAILY WITH A MEAL. 180 tablet 2  . lisinopril (ZESTRIL) 20 MG tablet Take 1 tablet (20 mg total) by mouth daily. 90 tablet 3  . torsemide (DEMADEX) 20 MG tablet Take 2 tablets (40 mg total) by mouth 2 (two) times daily. 360 tablet 3   No facility-administered medications prior to visit.    No Known Allergies  ROS Review of Systems  Constitutional: Negative for chills and fever.  HENT: Negative for congestion, sinus pressure, sinus pain and sore throat.   Eyes: Negative for pain and discharge.  Respiratory: Negative for cough and shortness of breath.   Cardiovascular: Positive for leg swelling. Negative for chest pain and palpitations.  Gastrointestinal: Positive for blood in stool. Negative for constipation, diarrhea, nausea and vomiting.  Endocrine: Negative for polydipsia and polyuria.  Genitourinary: Negative for dysuria and hematuria.  Musculoskeletal: Positive for back pain. Negative for neck pain and neck stiffness.  Skin: Negative for rash.  Neurological: Negative for  dizziness, weakness  and numbness.  Psychiatric/Behavioral: Positive for sleep disturbance. Negative for agitation and behavioral problems. The patient is nervous/anxious.       Objective:    Physical Exam Vitals reviewed.  Constitutional:      General: He is not in acute distress.    Appearance: He is obese. He is not diaphoretic.  HENT:     Head: Normocephalic and atraumatic.     Nose: Nose normal.     Mouth/Throat:     Mouth: Mucous membranes are moist.  Eyes:     General: No scleral icterus.    Extraocular Movements: Extraocular movements intact.     Pupils: Pupils are equal, round, and reactive to light.  Cardiovascular:     Rate and Rhythm: Normal rate and regular rhythm.     Heart sounds: Murmur (Systolic, most pronounced in right upper sternal border) heard.    Pulmonary:     Breath sounds: Normal breath sounds. No wheezing or rales.  Abdominal:     Palpations: Abdomen is soft.     Tenderness: There is no abdominal tenderness. There is no right CVA tenderness, left CVA tenderness, guarding or rebound.  Musculoskeletal:     Cervical back: Neck supple. No rigidity or tenderness.     Right lower leg: Edema (1+) present.     Left lower leg: Edema (1+) present.  Skin:    General: Skin is warm.     Findings: Rash (chronic discoloration of b/l leg, statis dermatitis) present.     Comments: Onychomycosis of toenails and fingernails  Neurological:     General: No focal deficit present.     Mental Status: He is alert and oriented to person, place, and time.     Sensory: No sensory deficit.     Motor: No weakness.  Psychiatric:        Mood and Affect: Mood normal.        Behavior: Behavior normal.     BP 132/84 (BP Location: Left Arm, Patient Position: Sitting, Cuff Size: Normal)   Pulse 69   Resp 18   Ht _0  (1.803 m)   Wt (!) 491 lb 1.9 oz (222.8 kg)   SpO2 97%   BMI 68.50 kg/m  Wt Readings from Last 3 Encounters:  10/08/20 (!) 491 lb 1.9 oz (222.8 kg)  10/01/20 (!) 483 lb  (219.1 kg)  07/14/20 (!) 489 lb (221.8 kg)     Health Maintenance Due  Topic Date Due  . OPHTHALMOLOGY EXAM  Never done  . TETANUS/TDAP  Never done  . COLONOSCOPY (Pts 45-2yr Insurance coverage will need to be confirmed)  Never done    There are no preventive care reminders to display for this patient.  Lab Results  Component Value Date   TSH 4.060 10/04/2020   Lab Results  Component Value Date   WBC 12.3 (H) 10/04/2020   HGB 10.4 (L) 10/04/2020   HCT 33.7 (L) 10/04/2020   MCV 81 10/04/2020   PLT 247 10/04/2020   Lab Results  Component Value Date   NA 139 10/04/2020   K 4.4 10/04/2020   CO2 23 10/04/2020   GLUCOSE 132 (H) 10/04/2020   BUN 35 (H) 10/04/2020   CREATININE 1.30 (H) 10/04/2020   BILITOT 0.6 10/04/2020   ALKPHOS 86 10/04/2020   AST 34 10/04/2020   ALT 21 10/04/2020   PROT 7.2 10/04/2020   ALBUMIN 4.0 10/04/2020   CALCIUM 9.2 10/04/2020   ANIONGAP 12 07/09/2020   EGFR  67 10/04/2020   Lab Results  Component Value Date   CHOL 123 06/10/2020   Lab Results  Component Value Date   HDL 25 (L) 06/10/2020   Lab Results  Component Value Date   LDLCALC 54 06/10/2020   Lab Results  Component Value Date   TRIG 281 (H) 06/10/2020   Lab Results  Component Value Date   CHOLHDL 4.9 06/10/2020   Lab Results  Component Value Date   HGBA1C 5.8 (H) 10/04/2020      Assessment & Plan:   Problem List Items Addressed This Visit      Cardiovascular and Mediastinum   Essential hypertension - Primary (Chronic)    BP Readings from Last 1 Encounters:  10/08/20 132/84   Well-controlled with Lisinopril and Metoprolol Counseled for compliance with the medications Advised DASH diet and moderate exercise/walking, at least 150 mins/week        Digestive   Gastrointestinal hemorrhage    Recurrent BRBPR, intermittent, once every other week or so CBC shows anemia Referred to GI for evaluation/colonoscopy On Protonix Started iron supplement       Relevant Orders   Ambulatory referral to Gastroenterology     Endocrine   DM2 (diabetes mellitus, type 2) (HCC)    HbA1C: 5.8 from 7.3 now With peripheral neuropathy On Metformin, Rybelsus and glipizide currently Had episode of hypoglycemia, will change Glipizide to 5 mg XL dose On Atorvastatin 40 mg QD      Relevant Medications   metFORMIN (GLUCOPHAGE) 850 MG tablet   glipiZIDE (GLUCOTROL XL) 5 MG 24 hr tablet   Semaglutide 7 MG TABS   Other Relevant Orders   CMP14+EGFR    Other Visit Diagnoses    Chronic right shoulder pain   ROM intact currently Likely pectoral muscle strain Tylenol PRN Heating pad/ice If persistent shoulder pain, will obtain imaging and/or Orthopedic surgery evaluation      Microcytic anemia       Relevant Medications   Iron, Ferrous Sulfate, 325 (65 Fe) MG TABS   Other Relevant Orders   CBC with Differential   Fe+TIBC+Fer      Meds ordered this encounter  Medications  . metFORMIN (GLUCOPHAGE) 850 MG tablet    Sig: Take 1 tablet (850 mg total) by mouth 2 (two) times daily with a meal.    Dispense:  180 tablet    Refill:  1  . glipiZIDE (GLUCOTROL XL) 5 MG 24 hr tablet    Sig: Take 1 tablet (5 mg total) by mouth daily with breakfast.    Dispense:  30 tablet    Refill:  5  . Iron, Ferrous Sulfate, 325 (65 Fe) MG TABS    Sig: Take 325 mg by mouth daily.    Dispense:  90 tablet    Refill:  1    Follow-up: Return in about 4 months (around 02/08/2021) for DM, HTN and GI bleeding.    [name], MD

## 2020-10-08 NOTE — Assessment & Plan Note (Signed)
Recurrent BRBPR, intermittent, once every other week or so CBC shows anemia Referred to GI for evaluation/colonoscopy On Protonix Started iron supplement

## 2020-10-08 NOTE — Assessment & Plan Note (Addendum)
HbA1C: 5.8 from 7.3 now With peripheral neuropathy On Metformin, Rybelsus and glipizide currently Had episode of hypoglycemia, will change Glipizide to 5 mg XL dose On Atorvastatin 40 mg QD

## 2020-10-11 ENCOUNTER — Encounter (INDEPENDENT_AMBULATORY_CARE_PROVIDER_SITE_OTHER): Payer: Self-pay | Admitting: *Deleted

## 2020-10-12 ENCOUNTER — Ambulatory Visit: Payer: Medicare HMO | Admitting: Student

## 2020-10-12 ENCOUNTER — Other Ambulatory Visit: Payer: Self-pay

## 2020-10-12 ENCOUNTER — Encounter: Payer: Self-pay | Admitting: Student

## 2020-10-12 VITALS — BP 122/82 | HR 65 | Ht 71.0 in | Wt >= 6400 oz

## 2020-10-12 DIAGNOSIS — D649 Anemia, unspecified: Secondary | ICD-10-CM | POA: Diagnosis not present

## 2020-10-12 DIAGNOSIS — I480 Paroxysmal atrial fibrillation: Secondary | ICD-10-CM | POA: Diagnosis not present

## 2020-10-12 DIAGNOSIS — Z952 Presence of prosthetic heart valve: Secondary | ICD-10-CM | POA: Diagnosis not present

## 2020-10-12 DIAGNOSIS — Z79899 Other long term (current) drug therapy: Secondary | ICD-10-CM

## 2020-10-12 DIAGNOSIS — G4733 Obstructive sleep apnea (adult) (pediatric): Secondary | ICD-10-CM | POA: Diagnosis not present

## 2020-10-12 DIAGNOSIS — I442 Atrioventricular block, complete: Secondary | ICD-10-CM | POA: Diagnosis not present

## 2020-10-12 DIAGNOSIS — I5032 Chronic diastolic (congestive) heart failure: Secondary | ICD-10-CM | POA: Diagnosis not present

## 2020-10-12 DIAGNOSIS — Z9989 Dependence on other enabling machines and devices: Secondary | ICD-10-CM | POA: Diagnosis not present

## 2020-10-12 NOTE — Patient Instructions (Signed)
Medication Instructions:  Your physician recommends that you continue on your current medications as directed. Please refer to the Current Medication list given to you today.  Take your Metolazone on tomorrow.  *If you need a refill on your cardiac medications before your next appointment, please call your pharmacy*   Lab Work: Your physician recommends that you return for lab work in: 2 Months ( August)   If you have labs (blood work) drawn today and your tests are completely normal, you will receive your results only by: Marland Kitchen MyChart Message (if you have MyChart) OR . A paper copy in the mail If you have any lab test that is abnormal or we need to change your treatment, we will call you to review the results.   Testing/Procedures: NONE    Follow-Up: At [facility], you and your health needs are our priority.  As part of our continuing mission to provide you with exceptional heart care, we have created designated Provider Care Teams.  These Care Teams include your primary Cardiologist (physician) and Advanced Practice Providers (APPs -  Physician Assistants and Nurse Practitioners) who all work together to provide you with the care you need, when you need it.  We recommend signing up for the patient portal called "MyChart".  Sign up information is provided on this After Visit Summary.  MyChart is used to connect with patients for Virtual Visits (Telemedicine).  Patients are able to view lab/test results, encounter notes, upcoming appointments, etc.  Non-urgent messages can be sent to your provider as well.   To learn more about what you can do with MyChart, go to [email or web address].    Your next appointment:   3-4 month(s)  The format for your next appointment:   In Person  Provider:   Bernerd Pho, PA-C   Other Instructions Thank you for choosing [facility]!

## 2020-10-12 NOTE — Progress Notes (Signed)
Cardiology Office Note    Date:  10/12/2020   ID:  ARJEN DERINGER, DOB 1970/06/02, MRN [id number]  PCP:  [name], MD  Cardiologist: Jenkins Rouge, MD   EP: Dr. Rayann Heman  Chief Complaint  Patient presents with  . Follow-up    3 month visit    History of Present Illness:    Reginald [name] is a 50 y.o. male with past medical history of severeAS (s/p bovine tissue AVR in 08/2017 with repair of ascending thoracic aortic aneurysm),normalcoronary arteriesby cardiac catheterization in 2019, paroxysmal atrial fibrillation, CHB(s/p PPM placement in 08/2017), history of bilateral PE, HTN, morbid obesity and OSAwho presents to the office today for 80-monthfollow-up.  He was last examined by Dr. NJohnsie Cancelin 06/2020 and had recently been evaluated in the ED for chest pain and ruled out for ACS. At the time of his visit, he denied any recurrent chest pain but reported having chronic dyspnea on exertion which was unchanged. It was recommended he have a follow-up echocardiogram for reassessment of his EF and aortic valve. This showed a preserved EF of 60 to 65% with no regional wall motion abnormalities. He did have mild LVH, mildly reduced RV function, normal PASP and his aortic valve showed a mean gradient of 26 mmHg which was similar to 2019 with no evidence of regurgitation.  He did reach out following his visit as creatinine had been elevated at the time of his ER evaluation (at 1.83) and Torsemide was reduced to 427min AM/2057mn PM (patient reports having remained on 42m60mD dosing). Repeat labs on 10/04/2020 showed his creatinine was improved to 1.30. Hgb A1c had also improved from 7.3 to 5.8. Hgb was low at 10.4 and he was referred to GI given his reports of hematochezia.   In talking with the patient today, he reports having worsening fatigue over the past few months. He has baseline dyspnea on exertion but denies any specific orthopnea or PND. He reports good compliance with his CPAP at  night. No recent chest pain or persistent palpitations. He does report discomfort along his right shoulder which has been intermittent as well (improves with NSAIDS but he tries to not take these regularly given the use of anticoagulation).   Past Medical History:  Diagnosis Date  . Anxiety   . Aortic stenosis   . Arthritis   . Asthma   . Back pain   . Cellulitis of skin with lymphangitis   . CHF (congestive heart failure) (HCC)[place]. Chronic diastolic congestive heart failure (HCC)[place]. Chronic venous insufficiency   . Constipation   . COPD (chronic obstructive pulmonary disease) (HCC)[place]. Depression   . Dyspnea   . Essential hypertension   . Gout   . Heart murmur   . Hypertension   . Morbid obesity (HCC)[place]. Obesity   . Pneumonia    walking pneumonia  . Pre-diabetes   . Prostatitis   . Pulmonary embolism (HCC)[place]. Pulmonary hypertension (HCC)[place]. Pyelonephritis   . S/P aortic valve replacement with bioprosthetic valve 08/23/2017   25 mm Edwards Inspiris Resilia stented bovine pericardial tissue valve  . 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 Sleep apnea    cpap   . Thoracic ascending aortic aneurysm (HCC)[place]. Thoracic ascending aortic aneurysm (HCC)[place]. Tobacco abuse     Past  Surgical History:  Procedure Laterality Date  . AORTIC VALVE REPLACEMENT N/A 08/23/2017   Procedure: AORTIC VALVE REPLACEMENT (AVR) USING INSPIRIS RESILIA AORTIC VALVE SIZE 25 MM;  Surgeon: Rexene Alberts, MD;  Location: [facility];  Service: Open Heart Surgery;  Laterality: N/A;  . CARDIAC VALVE REPLACEMENT N/A    [employer]rees[employer] 02/07/2020  . MULTIPLE EXTRACTIONS WITH ALVEOLOPLASTY N/A 07/23/2017   Procedure: Extraction of tooth #10 with alveoloplasty and gross debridement of remaining teeth;  Surgeon: Lenn Cal, DDS;  Location: [facility];  Service: Oral Surgery;  Laterality: N/A;  . NO PAST SURGERIES    . PACEMAKER IMPLANT N/A 08/28/2017    St Jude  Medical Assurity MRI conditional  dual-chamber pacemaker for symptomatic complete heart blockby Dr Rayann Heman  . TEE WITHOUT CARDIOVERSION N/A 07/16/2017   Procedure: TRANSESOPHAGEAL ECHOCARDIOGRAM (TEE);  Surgeon: Lelon Perla, MD;  [facility] END[facility]COPY;  Service: Cardiovascular;  Laterality: N/A;  . TEE WITHOUT CARDIOVERSION N/A 08/23/2017   Procedure: TRANSESOPHAGEAL ECHOCARDIOGRAM (TEE);  Surgeon: Rexene Alberts, MD;  Location: [facility];  Service: Open Heart Surgery;  Laterality: N/A;  . THORACIC AORTIC ANEURYSM REPAIR N/A 08/23/2017   Procedure: THORACIC ASCENDING ANEURYSM REPAIR (AAA) USING HEMASHIELD GOLD KNITTED MICROVEL DOUBLE VELOUR VASCULAR GRAFT D: 24 MM  L: 30 CM;  Surgeon: Rexene Alberts, MD;  Location: [place];  Service: Open Heart Surgery;  Laterality: N/A;    Current Medications: Outpatient Medications Prior to Visit  Medication Sig Dispense Refill  . acetaminophen (TYLENOL) 325 MG tablet Take 650 mg by mouth every 6 (six) hours as needed for mild pain.    Marland Kitchen albuterol (PROVENTIL) (2.5 MG/3ML) 0.083% nebulizer solution INHALE 1 VIAL VIA NEBULIZER EVERY 6 HOURS AS NEEDED FOR WHEEZING OR SHORTNESS OF BREATH 270 mL 1  . albuterol (VENTOLIN HFA) 108 (90 Base) MCG/ACT inhaler Inhale 2 puffs into the lungs every 6 (six) hours as needed for wheezing or shortness of breath. 8 g 3  . allopurinol (ZYLOPRIM) 300 MG tablet TAKE 1 TABLET EVERY DAY 90 tablet 0  . atorvastatin (LIPITOR) 40 MG tablet TAKE 1 TABLET EVERY DAY 90 tablet 0  . Blood Glucose Monitoring Suppl (TRUE METRIX METER) w/Device KIT USE AS DIRECTED 1 kit 0  . busPIRone (BUSPAR) 10 MG tablet TAKE 1 TABLET THREE TIMES DAILY 270 tablet 0  . citalopram (CELEXA) 40 MG tablet TAKE 1 TABLET EVERY DAY 90 tablet 0  . docusate sodium (COLACE) 100 MG capsule Take 200 mg by mouth daily as needed for mild constipation.    . fexofenadine (ALLEGRA) 180 MG tablet Take 180 mg by mouth daily.    Marland Kitchen gabapentin (NEURONTIN) 100 MG capsule Take 1  capsule in the morning and 2 capsules in the evening. 270 capsule 1  . glipiZIDE (GLUCOTROL XL) 5 MG 24 hr tablet Take 1 tablet (5 mg total) by mouth daily with breakfast. 30 tablet 5  . guaiFENesin (MUCINEX) 600 MG 12 hr tablet Take 600 mg by mouth 2 (two) times daily.    . Iron, Ferrous Sulfate, 325 (65 Fe) MG TABS Take 325 mg by mouth daily. 90 tablet 1  . Krill Oil 350 MG CAPS Take 1 capsule by mouth in the morning and at bedtime.     Marland Kitchen loratadine (CLARITIN) 10 MG tablet Take 10 mg by mouth daily as needed for allergies.    . metFORMIN (GLUCOPHAGE) 850 MG tablet Take 1 tablet (850 mg total) by mouth 2 (two) times daily with a meal. 180  tablet 1  . metolazone (ZAROXOLYN) 5 MG tablet PRN (Patient taking differently: Take 5 mg by mouth daily as needed (swelling). PRN) 90 tablet 3  . metoprolol tartrate (LOPRESSOR) 100 MG tablet Take 1 tablet (100 mg total) by mouth 2 (two) times daily. 180 tablet 3  . mupirocin ointment (BACTROBAN) 2 % Apply 1 application topically 2 (two) times daily. 22 g 0  . pantoprazole (PROTONIX) 40 MG tablet Take 1 tablet (40 mg total) by mouth daily. 90 tablet 3  . potassium chloride SA (KLOR-CON) 20 MEQ tablet TAKE 3 TABS DAILY AND TAKE AN ADDITIONAL 1 TAB ON THE DAY YOU TAKE YOUR WEEKLY METOLAZONE DOSE 283 tablet 3  . rivaroxaban (XARELTO) 20 MG TABS tablet TAKE 1 Tablet BY MOUTH ONCE EVERY DAY WITH SUPPER 90 tablet 3  . Semaglutide 7 MG TABS Take 1 tablet by mouth daily.    Marland Kitchen terbinafine (LAMISIL) 250 MG tablet Take 1 tablet (250 mg total) by mouth daily. 28 tablet 2  . torsemide (DEMADEX) 20 MG tablet Take 2 tablets (40 mg total) by mouth 2 (two) times daily. 360 tablet 3  . TRUE METRIX BLOOD GLUCOSE TEST test strip TEST ONE TIME DAILY FOR DIABETES 100 strip 0  . TRUEplus Lancets 33G MISC TEST ONE TIME DAILY FOR DIABETES 100 each 0  . lisinopril (ZESTRIL) 20 MG tablet Take 1 tablet (20 mg total) by mouth daily. 90 tablet 3   No facility-administered medications prior  to visit.     Allergies:   Patient has no known allergies.   Social History   Socioeconomic History  . Marital status: Divorced    Spouse name: Not on file  . Number of children: Not on file  . Years of education: Not on file  . Highest education level: Not on file  Occupational History  . Not on file  Tobacco Use  . Smoking status: Former Smoker    Packs/day: 2.00    Years: 16.00    Pack years: 32.00    Types: Cigarettes    Quit date: 08/23/2017    Years since quitting: 3.1  . Smokeless tobacco: Never Used  Vaping Use  . Vaping Use: Never used  Substance and Sexual Activity  . Alcohol use: No    Comment: have had alcohol in the past, not heavy  . Drug use: No  . Sexual activity: Not on file  Other Topics Concern  . Not on file  Social History Narrative  . Not on file   Social Determinants of Health   Financial Resource Strain: Low Risk   . Difficulty of Paying Living Expenses: Not very hard  Food Insecurity: No Food Insecurity  . Worried About Charity fundraiser in the Last Year: Never true  . Ran Out of Food in the Last Year: Never true  Transportation Needs: No Transportation Needs  . Lack of Transportation (Medical): No  . Lack of Transportation (Non-Medical): No  Physical Activity: Inactive  . Days of Exercise per Week: 0 days  . Minutes of Exercise per Session: 0 min  Stress: Stress Concern Present  . Feeling of Stress : Rather much  Social Connections: Socially Isolated  . Frequency of Communication with Friends and Family: More than three times a week  . Frequency of Social Gatherings with Friends and Family: Once a week  . Attends Religious Services: Never  . Active Member of Clubs or Organizations: No  . Attends Archivist Meetings: Never  . Marital Status:  Divorced     Family History:  [name] family history includes Alzheimer's disease in his mother; Diabetes in his brother; Heart attack in his brother; Hypertension in his mother.    Review of Systems:    Please see the history of present illness.     All other systems reviewed and are otherwise negative except as noted above.   Physical Exam:    VS:  BP 122/82   Pulse 65   Ht _0  (1.803 m)   Wt (!) 498 lb 3.2 oz (226 kg)   SpO2 98%   BMI 69.48 kg/m    General: Pleasant, obese male appearing in no acute distress. Head: Normocephalic, atraumatic. Neck: No carotid bruits. JVD difficult to assess secondary to body habitus.  Lungs: Respirations regular and unlabored, without wheezes or rales.  Heart: Regular rate and rhythm. No S3 or S4.  2/6 SEM along RUSB.  Abdomen: Appears non-distended. No obvious abdominal masses. Msk:  Strength and tone appear normal for age. No obvious joint deformities or effusions. Extremities: No clubbing or cyanosis. 1+ pitting edema bilaterally.  Distal pedal pulses are 2+ bilaterally. Neuro: Alert and oriented X 3. Moves all extremities spontaneously. No focal deficits noted. Psych:  Responds to questions appropriately with a normal affect. Skin: No rashes or lesions noted  Wt Readings from Last 3 Encounters:  10/12/20 (!) 498 lb 3.2 oz (226 kg)  10/08/20 (!) 491 lb 1.9 oz (222.8 kg)  10/01/20 (!) 483 lb (219.1 kg)     Studies/Labs Reviewed:   EKG:  EKG is not ordered today.    Recent Labs: 07/09/2020: B Natriuretic Peptide 70.0 10/04/2020: ALT 21; BUN 35; Creatinine, Ser 1.30; Hemoglobin 10.4; Platelets 247; Potassium 4.4; Sodium 139; TSH 4.060   Lipid Panel    Component Value Date/Time   CHOL 123 06/10/2020 0837   TRIG 281 (H) 06/10/2020 0837   HDL 25 (L) 06/10/2020 0837   CHOLHDL 4.9 06/10/2020 0837   CHOLHDL 6.6 01/01/2020 1014   VLDL 73 (H) 01/01/2020 1014   LDLCALC 54 06/10/2020 0837    Additional studies/ records that were reviewed today include:   Echocardiogram: 07/2020 IMPRESSIONS    1. Images are limited despite use of Definity.  2. Left ventricular ejection fraction, by estimation, is 60 to  65%. The  left ventricle has normal function. Left ventricular endocardial border  not optimally defined to evaluate regional wall motion. There is mild left  ventricular hypertrophy. Left  ventricular diastolic parameters are indeterminate. Elevated left atrial  pressure.  3. Right ventricular systolic function is mildly reduced. The right  ventricular size is normal. There is normal pulmonary artery systolic  pressure. The estimated right ventricular systolic pressure is 45.6 mmHg.  Device wire present.  4. The mitral valve is grossly normal. Trivial mitral valve  regurgitation.  5. The aortic valve has been repaired/replaced. Aortic valve  regurgitation is not visualized. There is a 25 mm Edwards valve present in  the aortic position. Aortic valve mean gradient measures 26.0 mmHg. Mean  gradient was 23 mmHg in 2019, relatively  stable - could be suggestive of patient/prosthesis mismatch. Poorly  visualized.  6. The inferior vena cava is dilated in size with >50% respiratory  variability, suggesting right atrial pressure of 8 mmHg.   Assessment:    1. Hx of aortic valve replacement   2. Chronic diastolic (congestive) heart failure ([place])   3. Anemia, unspecified type   4. Paroxysmal atrial fibrillation (HCC)   5. Medication  management   6. CHB (complete heart block) (HCC)   7. OSA on CPAP      Plan:   In order of problems listed above:  1. Aortic Stenosis - He is s/p bovine tissue AVR in 08/2017 with repair of ascending thoracic aortic aneurysm.  Recent echocardiogram in 07/2020 showed a mean gradient of 26 mmHg which was similar to prior imaging in 2019 with no evidence of regurgitation.  2. Chronic Diastolic CHF - He does appear volume overloaded by examination today but has not yet taken Metolazone this week due to frequent urination with the medication and having several office visits scheduled for this week. Will continue Torsemide 40 mg twice daily and I encouraged  him to take his Metolazone tomorrow. Will continue to utilize this at least once a week and to take extra doses if needed.  Creatinine had improved from 1.83 to 1.30 by repeat labs earlier this month. Will recheck in 2 months.   3. Anemia/Hematochezia - His hemoglobin had declined to 10.4 by recent labs earlier this month and he was referred to GI by his PCP but is unsure when his appointment will be scheduled. Will plan to recheck a CBC at the time of his BMET in 2 months.  I encouraged him to seek emergent evaluation in the interim if he developed worsening bleeding issues as he is on Xarelto for anticoagulation  4. Paroxysmal Atrial Fibrillation - He does experience occasional palpitations and most recent remote interrogation last month showed 5 brief episodes of atrial fibrillation. Continue Lopressor 100 mg twice daily for rate control along with Xarelto for anticoagulation.  5. CHB - He is s/p PPM placement in 08/2017 which is followed by Dr. Rayann Heman. His device was functioning normally by interrogation in 08/2020.  6. OSA - Continue with CPAP.    Medication Adjustments/Labs and Tests Ordered: Current medicines are reviewed at length with the patient today.  Concerns regarding medicines are outlined above.  Medication changes, Labs and Tests ordered today are listed in the Patient Instructions below. Patient Instructions  Medication Instructions:  Your physician recommends that you continue on your current medications as directed. Please refer to the Current Medication list given to you today.  Take your Metolazone on tomorrow.  *If you need a refill on your cardiac medications before your next appointment, please call your pharmacy*   Lab Work: Your physician recommends that you return for lab work in: 2 Months ( August)   If you have labs (blood work) drawn today and your tests are completely normal, you will receive your results only by: Marland Kitchen MyChart Message (if you have MyChart)  OR . A paper copy in the mail If you have any lab test that is abnormal or we need to change your treatment, we will call you to review the results.   Testing/Procedures: NONE    Follow-Up: At [facility], you and your health needs are our priority.  As part of our continuing mission to provide you with exceptional heart care, we have created designated Provider Care Teams.  These Care Teams include your primary Cardiologist (physician) and Advanced Practice Providers (APPs -  Physician Assistants and Nurse Practitioners) who all work together to provide you with the care you need, when you need it.  We recommend signing up for the patient portal called "MyChart".  Sign up information is provided on this After Visit Summary.  MyChart is used to connect with patients for Virtual Visits (Telemedicine).  Patients are able  to view lab/test results, encounter notes, upcoming appointments, etc.  Non-urgent messages can be sent to your provider as well.   To learn more about what you can do with MyChart, go to [email or web address].    Your next appointment:   3-4 month(s)  The format for your next appointment:   In Person  Provider:   Bernerd Pho, PA-C   Other Instructions Thank you for choosing [facility]!       Signed, Erma Heritage, PA-C  10/12/2020 5:21 PM    [place] [address] [place], [place] [address] Phone: [phone] Fax: [phone]

## 2020-10-13 ENCOUNTER — Encounter: Payer: Self-pay | Admitting: Internal Medicine

## 2020-10-13 ENCOUNTER — Ambulatory Visit: Payer: Medicare HMO | Admitting: Podiatry

## 2020-10-13 ENCOUNTER — Encounter: Payer: Self-pay | Admitting: Podiatry

## 2020-10-13 DIAGNOSIS — M2042 Other hammer toe(s) (acquired), left foot: Secondary | ICD-10-CM

## 2020-10-13 DIAGNOSIS — E1169 Type 2 diabetes mellitus with other specified complication: Secondary | ICD-10-CM

## 2020-10-13 DIAGNOSIS — E119 Type 2 diabetes mellitus without complications: Secondary | ICD-10-CM | POA: Diagnosis not present

## 2020-10-13 DIAGNOSIS — M2041 Other hammer toe(s) (acquired), right foot: Secondary | ICD-10-CM

## 2020-10-13 MED ORDER — AMMONIUM LACTATE 12 % EX LOTN: 1.0000 "application " | TOPICAL_LOTION | CUTANEOUS | 0 refills | Status: DC | PRN

## 2020-10-14 ENCOUNTER — Other Ambulatory Visit: Payer: Self-pay | Admitting: Internal Medicine

## 2020-10-14 DIAGNOSIS — G8929 Other chronic pain: Secondary | ICD-10-CM

## 2020-10-15 ENCOUNTER — Encounter: Payer: Self-pay | Admitting: Podiatry

## 2020-10-15 NOTE — Progress Notes (Signed)
Subjective: Reginald [name] presents today referred by [name], MD for diabetic foot evaluation.  Patient relates 1 year history of diabetes.  Patient denies any history of foot wounds.  Patient has history of numbness, tingling, burning, pins/needles sensations.  Past Medical History:  Diagnosis Date  . Anxiety   . Aortic stenosis   . Arthritis   . Asthma   . Back pain   . Cellulitis of skin with lymphangitis   . CHF (congestive heart failure) ([place])   . Chronic diastolic congestive heart failure ([place])   . Chronic venous insufficiency   . Constipation   . COPD (chronic obstructive pulmonary disease) ([place])   . Depression   . Dyspnea   . Essential hypertension   . Gout   . Heart murmur   . Hypertension   . Morbid obesity ([place])   . Obesity   . Pneumonia    walking pneumonia  . Pre-diabetes   . Prostatitis   . Pulmonary embolism ([place])   . Pulmonary hypertension ([place])   . Pyelonephritis   . S/P aortic valve replacement with bioprosthetic valve 08/23/2017   25 mm Edwards Inspiris Resilia stented bovine pericardial tissue valve  . 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 Sleep apnea    cpap   . Thoracic ascending aortic aneurysm ([place])   . Thoracic ascending aortic aneurysm ([place])   . Tobacco abuse     Patient Active Problem List   Diagnosis Date Noted  . Gastrointestinal hemorrhage 10/08/2020  . S/P placement of cardiac pacemaker 02/10/2020  . Encounter for examination following treatment at hospital 02/10/2020  . Anxiety 02/10/2020  . DM2 (diabetes mellitus, type 2) ([place]) 02/10/2020  . OSA on CPAP   . Abnormal liver function 03/16/2019  . History of pulmonary embolism 03/16/2019  . Paroxysmal atrial fibrillation ([place]) 09/05/2018  . COPD (chronic obstructive pulmonary disease) ([place]) 05/07/2018  . Gastroesophageal reflux disease   . Chest pain 04/22/2018  . Pulmonary embolism ([place]) 09/18/2017  . Encounter  for therapeutic drug monitoring 09/04/2017  . 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 08/23/2017  . 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 Pulmonary hypertension ([place])   . Chronic periodontitis 07/18/2017  . Morbid obesity ([place])   . Essential hypertension   . Tobacco abuse   . Chronic heart failure with preserved ejection fraction (HFpEF) ([place])   . Chronic venous insufficiency     Past Surgical History:  Procedure Laterality Date  . AORTIC VALVE REPLACEMENT N/A 08/23/2017   Procedure: AORTIC VALVE REPLACEMENT (AVR) USING INSPIRIS RESILIA AORTIC VALVE SIZE 25 MM;  Surgeon: Rexene Alberts, MD;  Location: [facility];  Service: Open Heart Surgery;  Laterality: N/A;  . CARDIAC VALVE REPLACEMENT N/A    Phreesia 02/07/2020  . MULTIPLE EXTRACTIONS WITH ALVEOLOPLASTY N/A 07/23/2017   Procedure: Extraction of tooth #10 with alveoloplasty and gross debridement of remaining teeth;  Surgeon: Lenn Cal, DDS;  Location: [facility];  Service: Oral Surgery;  Laterality: N/A;  . NO PAST SURGERIES    . PACEMAKER IMPLANT N/A 08/28/2017    [employer] Jude [employer] Assurity MRI conditional  dual-chamber pacemaker for symptomatic complete heart blockby Dr Rayann Heman  . TEE WITHOUT CARDIOVERSION N/A 07/16/2017   Procedure: TRANSESOPHAGEAL ECHOCARDIOGRAM (TEE);  Surgeon: Lelon Perla, MD;  Location[facility] ENDOSCOPY;  Service: Cardiovascular;  Laterality: N/A;  . TEE WITHOUT CARDIOVERSION N/A 08/23/2017   Procedure:  TRANSESOPHAGEAL ECHOCARDIOGRAM (TEE);  Surgeon: Rexene Alberts, MD;  Location: [facility];  Service: Open Heart Surgery;  Laterality: N/A;  . THORACIC AORTIC ANEURYSM REPAIR N/A 08/23/2017   Procedure: THORACIC ASCENDING ANEURYSM REPAIR (AAA) USING HEMASHIELD GOLD KNITTED MICROVEL DOUBLE VELOUR VASCULAR GRAFT D: 24 MM  L: 30 CM;  Surgeon: Rexene Alberts, MD;  Location: [facility];  Service: Open Heart Surgery;  Laterality: N/A;    Current Outpatient Medications  on File Prior to Visit  Medication Sig Dispense Refill  . acetaminophen (TYLENOL) 325 MG tablet Take 650 mg by mouth every 6 (six) hours as needed for mild pain.    Marland Kitchen albuterol (PROVENTIL) (2.5 MG/3ML) 0.083% nebulizer solution INHALE 1 VIAL VIA NEBULIZER EVERY 6 HOURS AS NEEDED FOR WHEEZING OR SHORTNESS OF BREATH 270 mL 1  . albuterol (VENTOLIN HFA) 108 (90 Base) MCG/ACT inhaler Inhale 2 puffs into the lungs every 6 (six) hours as needed for wheezing or shortness of breath. 8 g 3  . allopurinol (ZYLOPRIM) 300 MG tablet TAKE 1 TABLET EVERY DAY 90 tablet 0  . atorvastatin (LIPITOR) 40 MG tablet TAKE 1 TABLET EVERY DAY 90 tablet 0  . Blood Glucose Monitoring Suppl (TRUE METRIX METER) w/Device KIT USE AS DIRECTED 1 kit 0  . busPIRone (BUSPAR) 10 MG tablet TAKE 1 TABLET THREE TIMES DAILY 270 tablet 0  . citalopram (CELEXA) 40 MG tablet TAKE 1 TABLET EVERY DAY 90 tablet 0  . docusate sodium (COLACE) 100 MG capsule Take 200 mg by mouth daily as needed for mild constipation.    . fexofenadine (ALLEGRA) 180 MG tablet Take 180 mg by mouth daily.    Marland Kitchen gabapentin (NEURONTIN) 100 MG capsule Take 1 capsule in the morning and 2 capsules in the evening. 270 capsule 1  . glipiZIDE (GLUCOTROL XL) 5 MG 24 hr tablet Take 1 tablet (5 mg total) by mouth daily with breakfast. 30 tablet 5  . guaiFENesin (MUCINEX) 600 MG 12 hr tablet Take 600 mg by mouth 2 (two) times daily.    . Iron, Ferrous Sulfate, 325 (65 Fe) MG TABS Take 325 mg by mouth daily. 90 tablet 1  . Krill Oil 350 MG CAPS Take 1 capsule by mouth in the morning and at bedtime.     Marland Kitchen lisinopril (ZESTRIL) 20 MG tablet Take 1 tablet (20 mg total) by mouth daily. 90 tablet 3  . loratadine (CLARITIN) 10 MG tablet Take 10 mg by mouth daily as needed for allergies.    . metFORMIN (GLUCOPHAGE) 850 MG tablet Take 1 tablet (850 mg total) by mouth 2 (two) times daily with a meal. 180 tablet 1  . metolazone (ZAROXOLYN) 5 MG tablet PRN (Patient taking differently: Take  5 mg by mouth daily as needed (swelling). PRN) 90 tablet 3  . metoprolol tartrate (LOPRESSOR) 100 MG tablet Take 1 tablet (100 mg total) by mouth 2 (two) times daily. 180 tablet 3  . mupirocin ointment (BACTROBAN) 2 % Apply 1 application topically 2 (two) times daily. 22 g 0  . pantoprazole (PROTONIX) 40 MG tablet Take 1 tablet (40 mg total) by mouth daily. 90 tablet 3  . potassium chloride SA (KLOR-CON) 20 MEQ tablet TAKE 3 TABS DAILY AND TAKE AN ADDITIONAL 1 TAB ON THE DAY YOU TAKE YOUR WEEKLY METOLAZONE DOSE 283 tablet 3  . rivaroxaban (XARELTO) 20 MG TABS tablet TAKE 1 Tablet BY MOUTH ONCE EVERY DAY WITH SUPPER 90 tablet 3  . Semaglutide 7 MG TABS Take 1 tablet by  mouth daily.    Marland Kitchen terbinafine (LAMISIL) 250 MG tablet Take 1 tablet (250 mg total) by mouth daily. 28 tablet 2  . torsemide (DEMADEX) 20 MG tablet Take 2 tablets (40 mg total) by mouth 2 (two) times daily. 360 tablet 3  . TRUE METRIX BLOOD GLUCOSE TEST test strip TEST ONE TIME DAILY FOR DIABETES 100 strip 0  . TRUEplus Lancets 33G MISC TEST ONE TIME DAILY FOR DIABETES 100 each 0   No current facility-administered medications on file prior to visit.     No Known Allergies  Social History   Occupational History  . Not on file  Tobacco Use  . Smoking status: Former Smoker    Packs/day: 2.00    Years: 16.00    Pack years: 32.00    Types: Cigarettes    Quit date: 08/23/2017    Years since quitting: 3.1  . Smokeless tobacco: Never Used  Vaping Use  . Vaping Use: Never used  Substance and Sexual Activity  . Alcohol use: No    Comment: have had alcohol in the past, not heavy  . Drug use: No  . Sexual activity: Not on file    Family History  Problem Relation Age of Onset  . Hypertension Mother   . Alzheimer's disease Mother   . Heart attack Brother   . Diabetes Brother     Immunization History  Administered Date(s) Administered  . Influenza Inj Mdck Quad Pf 02/27/2019  . Influenza,inj,Quad PF,6+ Mos 02/10/2020  .  Janssen (J&J) SARS-COV-2 Vaccination 09/16/2019  . PFIZER(Purple Top)SARS-COV-2 Vaccination 03/15/2020  . Pneumococcal Polysaccharide-23 03/17/2019    Review of systems: Positive Findings in bold print.  Constitutional:  chills, fatigue, fever, sweats, weight change Communication: Optometrist, sign Ecologist, hand writing, iPad/Android device Head: headaches, head injury Eyes: changes in vision, eye pain, glaucoma, cataracts, macular degeneration, diplopia, glare,  light sensitivity, eyeglasses or contacts, blindness Ears nose mouth throat: hearing impaired, hearing aids,  ringing in ears, deaf, sign language,  vertigo, nosebleeds,  rhinitis,  cold sores, snoring, swollen glands Cardiovascular: HTN, edema, arrhythmia, pacemaker in place, defibrillator in place, chest pain/tightness, chronic anticoagulation, blood clot, heart failure, MI Peripheral Vascular: leg cramps, varicose veins, blood clots, lymphedema, varicosities Respiratory:  asthma, difficulty breathing, denies congestion, SOB, wheezing, cough, emphysema Gastrointestinal: change in appetite or weight, abdominal pain, constipation, diarrhea, nausea, vomiting, vomiting blood, change in bowel habits, abdominal pain, jaundice, rectal bleeding, hemorrhoids, GERD Genitourinary:  nocturia,  pain on urination, polyuria,  blood in urine, Foley catheter, urinary urgency, ESRD on hemodialysis Musculoskeletal: amputation, cramping, stiff joints, painful joints, decreased joint motion, fractures, OA, gout, hemiplegia, paraplegia, uses cane, wheelchair bound, uses walker, uses rollator Skin: +changes in toenails, color change, dryness, itching, mole changes,  rash, wound(s) Neurological: headaches, numbness in feet, paresthesias in feet, burning in feet, fainting,  seizures, change in speech, migraines, memory problems/poor historian, cerebral palsy, weakness, paralysis, CVA, TIA Endocrine: diabetes, hypothyroidism, hyperthyroidism,   goiter, dry mouth, flushing, heat intolerance, cold intolerance,  excessive thirst, denies polyuria,  nocturia Hematological:  easy bleeding, excessive bleeding, easy bruising, enlarged lymph nodes, on long term blood thinner, history of past transusions Allergy/immunological:  hives, eczema, frequent infections, multiple drug allergies, seasonal allergies, transplant recipient, multiple food allergies Psychiatric:  anxiety, depression, mood disorder, suicidal ideations, hallucinations, insomnia  Objective: There were no vitals filed for this visit. Vascular Examination: Capillary refill time less than 3 seconds x 10 digits.  Dorsalis pedis pulses palpable.  Posterior tibial pulses palpable.  Digital hair not present x 10 digits.  Skin temperature gradient WNL b/l.  Dermatological Examination: Skin with normal turgor, texture and tone b/l  Toenails 1-5 b/l discolored, thick, dystrophic with subungual debris and pain with palpation to nailbeds due to thickness of nails.  Musculoskeletal: Muscle strength 5/5 to all LE muscle groups.  Hammertoe contractures noted 2 through 5 bilaterally semiflexible in nature.  Pain on palpation.  Neurological: Sensation diminished with 10 gram monofilament.  Vibratory sensation diminished  Assessment: 1. NIDDM 2. Encounter for diabetic foot examination 3. Hammertoe contractures 2 through 5 bilaterally  Plan: 1. Discussed diabetic foot care principles. Literature dispensed on today. 2. Patient to continue soft, supportive shoe gear daily. 3. Patient to report any pedal injuries to medical professional immediately. 4. Follow up one year. 5. Patient/POA to call should there be a concern in the interim. 6. Patient will benefit from diabetic shoes given that patient has a hammertoe contracture as well as underlying diabetes with neuropathy.  I discussed this with the patient extensive detail he states understanding would like to proceed with  getting diabetic shoes.  He will be scheduled to see EJ for diabetic shoes

## 2020-10-18 ENCOUNTER — Other Ambulatory Visit: Payer: Self-pay | Admitting: Internal Medicine

## 2020-10-18 DIAGNOSIS — F419 Anxiety disorder, unspecified: Secondary | ICD-10-CM

## 2020-10-19 ENCOUNTER — Ambulatory Visit (INDEPENDENT_AMBULATORY_CARE_PROVIDER_SITE_OTHER): Payer: Medicare HMO | Admitting: Podiatry

## 2020-10-19 ENCOUNTER — Other Ambulatory Visit: Payer: Self-pay

## 2020-10-19 ENCOUNTER — Ambulatory Visit: Payer: Medicare HMO

## 2020-10-19 DIAGNOSIS — M2042 Other hammer toe(s) (acquired), left foot: Secondary | ICD-10-CM

## 2020-10-19 DIAGNOSIS — E1169 Type 2 diabetes mellitus with other specified complication: Secondary | ICD-10-CM

## 2020-10-19 DIAGNOSIS — M2041 Other hammer toe(s) (acquired), right foot: Secondary | ICD-10-CM

## 2020-10-19 NOTE — Progress Notes (Signed)
Patient presented for foam casting for 3 pair custom diabetic shoe inserts. Patient is measured with a brannock device to be a size 12 xx wide  Diabetic shoes are chosen from the [employer]. The shoes chosen are [id number]  The patient will be contacted when the shoes and inserts are ready to be picked up.

## 2020-10-20 ENCOUNTER — Other Ambulatory Visit: Payer: Self-pay | Admitting: *Deleted

## 2020-10-20 MED ORDER — RIVAROXABAN 20 MG PO TABS: ORAL_TABLET | ORAL | 3 refills | Status: DC

## 2020-10-20 NOTE — Telephone Encounter (Signed)
Prescription refill request for Xarelto received.  Indication: PAF Last office visit: 10/12/20  B. Strader PAC Weight: 226kg Age: 50 Scr: 1.30 on 10/04/20 CrCl: 219.72  Based on above findings Xarelto 20mg  daily is the appropriate dose.  Refill approved.

## 2020-10-23 ENCOUNTER — Other Ambulatory Visit: Payer: Self-pay | Admitting: Internal Medicine

## 2020-10-26 ENCOUNTER — Encounter: Payer: Self-pay | Admitting: Orthopedic Surgery

## 2020-10-26 ENCOUNTER — Other Ambulatory Visit: Payer: Self-pay

## 2020-10-26 ENCOUNTER — Ambulatory Visit: Payer: Medicare HMO

## 2020-10-26 ENCOUNTER — Ambulatory Visit: Payer: Medicare HMO | Admitting: Orthopedic Surgery

## 2020-10-26 VITALS — BP 126/75 | HR 63 | Ht 71.0 in | Wt >= 6400 oz

## 2020-10-26 DIAGNOSIS — M25511 Pain in right shoulder: Secondary | ICD-10-CM

## 2020-10-26 DIAGNOSIS — Z6841 Body Mass Index (BMI) 40.0 and over, adult: Secondary | ICD-10-CM

## 2020-10-26 DIAGNOSIS — M19011 Primary osteoarthritis, right shoulder: Secondary | ICD-10-CM

## 2020-10-26 MED ORDER — DICLOFENAC SODIUM 1 % EX GEL: 2.0000 g | Freq: Four times a day (QID) | CUTANEOUS | 1 refills | Status: DC

## 2020-10-26 NOTE — Patient Instructions (Signed)
Rotator Cuff Tear/Tendinitis Rehab   Ask your health care provider which exercises are safe for you. Do exercises exactly as told by your health care provider and adjust them as directed. It is normal to feel mild stretching, pulling, tightness, or discomfort as you do these exercises. Stop right away if you feel sudden pain or your pain gets worse. Do not begin these exercises until told by your health care provider. Stretching and range-of-motion exercises  These exercises warm up your muscles and joints and improve the movement and flexibility of your shoulder. These exercises also help to relieve pain.  Shoulder pendulum In this exercise, you let the injured arm dangle toward the floor and then swing it like a clock pendulum. 1. Stand near a table or counter that you can hold onto for balance. 2. Bend forward at the waist and let your left / right arm hang straight down. Use your other arm to support you and help you stay balanced. 3. Relax your left / right arm and shoulder muscles, and move your hips and your trunk so your left / right arm swings freely. Your arm should swing because of the motion of your body, not because you are using your arm or shoulder muscles. 4. Keep moving your hips and trunk so your arm swings in the following directions, as told by your health care provider: ? Side to side. ? Forward and backward. ? In clockwise and counterclockwise circles. 5. Slowly return to the starting position. Repeat 10 times, or for 10 seconds per direction. Complete this exercise 2-3 times a day.      Shoulder flexion, seated This exercise is sometimes called table slides. In this exercise, you raise your arm in front of your body until you feel a stretch in your injured shoulder. 1. Sit in a stable chair so your left / right forearm can rest on a flat surface. Your elbow should rest at a height that keeps your upper arm next to your body. 2. Keeping your left / right shoulder relaxed,  lean forward at the waist and let your hand slide forward (flexion). Stop when you feel a stretch in your shoulder, or when you reach the angle that is recommended by your health care provider. 3. Hold for 5 seconds. 4. Slowly return to the starting position. Repeat 10 times. Complete this exercise 1-2  times a day.       Shoulder flexion, standing In this exercise, you raise your arm in front of your body (flexion) until you feel a stretch in your injured shoulder. 1. Stand and hold a broomstick, a cane, or a similar object. Place your hands a little more than shoulder-width apart on the object. Your left / right hand should be palm-up, and your other hand should be palm-down. 2. Keep your elbow straight and your shoulder muscles relaxed. Push the stick up with your healthy arm to raise your left / right arm in front of your body, and then over your head until you feel a stretch in your shoulder. ? Avoid shrugging your shoulder while you raise your arm. Keep your shoulder blade tucked down toward the middle of your back. ? Keep your left / right shoulder muscles relaxed. 3. Hold for 10 seconds. 4. Slowly return to the starting position. Repeat 10 times. Complete this exercise 1-2 times a day.      Shoulder abduction, active-assisted You will need a stick, broom handle, or similar object to help you (assist) in doing this  exercise. 1. Lie on your back. This is the supine position. Hold a broomstick, a cane, or a similar object. 2. Place your hands a little more than shoulder-width apart on the object. Your left / right hand should be palm-up, and your other hand should be palm-down. 3. Keeping your shoulder relaxed, push the stick to raise your left / right arm out to your side (abduction) and then over your head. Use your other hand to help move the stick. Stop when you feel a stretch in your shoulder, or when you reach the angle that is recommended by your health care provider. ? Avoid  shrugging your shoulder while you raise your arm. Keep your shoulder blade tucked down toward the middle of your back. 4. Hold for 10 seconds. 5. Slowly return to the starting position. Repeat 10 times. Complete this exercise 1-2 times a day.      Shoulder flexion, active-assisted 1. Lie on your back. You may bend your knees for comfort. 2. Hold a broomstick, a cane, or a similar object so that your hands are about shoulder-width apart. Your palms should face toward your feet. 3. Raise your left / right arm over your head, then behind your head toward the floor (flexion). Use your other hand to help you do this (active-assisted). Stop when you feel a gentle stretch in your shoulder, or when you reach the angle that is recommended by your health care provider. 4. Hold for 10 seconds. 5. Use the stick and your other arm to help you return your left / right arm to the starting position. Repeat 10 times. Complete this exercise 1-2 times a day.      External rotation 1. Sit in a stable chair without armrests, or stand up. 2. Tuck a soft object, such as a folded towel or a small ball, under your left / right upper arm. 3. Hold a broomstick, a cane, or a similar object with your palms face-down, toward the floor. Bend your elbows to a 90-degree angle (right angle), and keep your hands about shoulder-width apart. 4. Straighten your healthy arm and push the stick across your body, toward your left / right side. Keep your left / right arm bent. This will rotate your left / right forearm away from your body (external rotation). 5. Hold for 10 seconds. 6. Slowly return to the starting position. Repeat 10 times. Complete this exercise 1-2 times a day.        Strengthening exercises These exercises build strength and endurance in your shoulder. Endurance is the ability to use your muscles for a long time, even after they get tired. Do not start doing these exercises until your health care provider  approves. Shoulder flexion, isometric 1. Stand or sit in a doorway, facing the door frame. 2. Keep your left / right arm straight and make a gentle fist with your hand. Place your fist against the door frame. Only your fist should be touching the frame. Keep your upper arm at your side. 3. Gently press your fist against the door frame, as if you are trying to raise your arm above your head (isometric shoulder flexion). ? Avoid shrugging your shoulder while you press your hand into the door frame. Keep your shoulder blade tucked down toward the middle of your back. 4. Hold for 10 seconds. 5. Slowly release the tension, and relax your muscles completely before you repeat the exercise. Repeat 10 times. Complete this exercise 3 times per week.  Shoulder abduction, isometric 1. Stand or sit in a doorway. Your left / right arm should be closest to the door frame. 2. Keep your left / right arm straight, and place the back of your hand against the door frame. Only your hand should be touching the frame. Keep the rest of your arm close to your side. 3. Gently press the back of your hand against the door frame, as if you are trying to raise your arm out to the side (isometric shoulder abduction). ? Avoid shrugging your shoulder while you press your hand into the door frame. Keep your shoulder blade tucked down toward the middle of your back. 4. Hold for 10 seconds. 5. Slowly release the tension, and relax your muscles completely before you repeat the exercise. Repeat 10 times. Complete this exercise 3 times per week.      Internal rotation, isometric This is an exercise in which you press your palm against a door frame without moving your shoulder joint (isometric). 1. Stand or sit in a doorway, facing the door frame. 2. Bend your left / right elbow, and place the palm of your hand against the door frame. Only your palm should be touching the frame. Keep your upper arm at your side. 3. Gently press  your hand against the door frame, as if you are trying to push your arm toward your abdomen (internal rotation). Gradually increase the pressure until you are pressing as hard as you can. Stop increasing the pressure if you feel shoulder pain. ? Avoid shrugging your shoulder while you press your hand into the door frame. Keep your shoulder blade tucked down toward the middle of your back. 4. Hold for 10 seconds. 5. Slowly release the tension, and relax your muscles completely before you repeat the exercise. Repeat 10 times. Complete this exercise 3 times per week.      External rotation, isometric This is an exercise in which you press the back of your wrist against a door frame without moving your shoulder joint (isometric). 1. Stand or sit in a doorway, facing the door frame. 2. Bend your left / right elbow and place the back of your wrist against the door frame. Only the back of your wrist should be touching the frame. Keep your upper arm at your side. 3. Gently press your wrist against the door frame, as if you are trying to push your arm away from your abdomen (external rotation). Gradually increase the pressure until you are pressing as hard as you can. Stop increasing the pressure if you feel pain. ? Avoid shrugging your shoulder while you press your wrist into the door frame. Keep your shoulder blade tucked down toward the middle of your back. 4. Hold for 10 seconds. 5. Slowly release the tension, and relax your muscles completely before you repeat the exercise. Repeat 10 times. Complete this exercise 3 times per week.       Scapular retraction 1. Sit in a stable chair without armrests, or stand up. 2. Secure an exercise band to a stable object in front of you so the band is at shoulder height. 3. Hold one end of the exercise band in each hand. Your palms should face down. 4. Squeeze your shoulder blades together (retraction) and move your elbows slightly behind you. Do not shrug your  shoulders upward while you do this. 5. Hold for 10 seconds. 6. Slowly return to the starting position. Repeat 10 times. Complete this exercise 3 times per week.  Shoulder extension 1. Sit in a stable chair without armrests, or stand up. 2. Secure an exercise band to a stable object in front of you so the band is above shoulder height. 3. Hold one end of the exercise band in each hand. 4. Straighten your elbows and lift your hands up to shoulder height. 5. Squeeze your shoulder blades together and pull your hands down to the sides of your thighs (extension). Stop when your hands are straight down by your sides. Do not let your hands go behind your body. 6. Hold for 10 seconds. 7. Slowly return to the starting position. Repeat 10 times. Complete this exercise 3 times per week.       Scapular protraction, supine 1. Lie on your back on a firm surface (supine position). Hold a 5 lbs (or soup can) weight in your left / right hand. 2. Raise your left / right arm straight into the air so your hand is directly above your shoulder joint. 3. Push the weight into the air so your shoulder (scapula) lifts off the surface that you are lying on. The scapula will push up or forward (protraction). Do not move your head, neck, or back. 4. Hold for 10 seconds. 5. Slowly return to the starting position. Let your muscles relax completely before you repeat this exercise.  Repeat 10 times. Complete this exercise 3 times per week.

## 2020-10-26 NOTE — Progress Notes (Signed)
New Patient Visit  Assessment: Reginald [name] is a 50 y.o. male with the following: Right AC joint arthritis; mild appearance on x-rays, but currently symptomatic  Plan: Patient's pain is primarily in the superior aspect of the right shoulder.  We reviewed radiographs in clinic today which demonstrates some mild degenerative changes at the right [facility] joint.  He states some topical sprays and lotions have improved his symptoms.  He also states that occasional ibuprofen has improved the pain, although he should not be taking these due to his extensive medical history and he is currently on Xarelto.  As a result, I recommended a trial of Voltaren topical gel to improve his symptoms.  I have also provided him with some home exercises for him to work on.  If this does not improve his pain, he can return to clinic for further consideration regarding a steroid injection.  If his pain were to persist, or worsen, he is not a good surgical candidate given his body habitus, as well as his extensive medical history including multiple cardiovascular surgeries.  Follow-up as needed.   The patient meets the AMA guidelines for Morbid obesity with BMI > 40.  The patient has been counseled on weight loss.    Follow-up: Return if symptoms worsen or fail to improve.  Subjective:  Chief Complaint  Patient presents with  . Shoulder Pain    Rt shoulder dull achy pain for 5-6 wks.     History of Present Illness: Reginald [name] is a 50 y.o. male who has been referred to clinic today by Ihor Dow, MD for evaluation of right shoulder pain.  He states he has had occasional right shoulder pain for many years.  However, approximately 5-6 weeks ago, he started to note significant pain in the superior aspect of his shoulder.  No specific injury.  He has not injured his right shoulder in the past.  He has been using Biofreeze spray, as well as a Shea butter lotion recently, which is improving his pain.  He states his pain is  not that bad today.  Occasionally, he will take ibuprofen, but he is aware that he should not be taking this medication as he is on Xarelto, and has had some issues with GI bleeding in the past.  He uses a cane to assist with ambulation, and uses both arms intermittently.  Pain is primarily in the superior aspect of his shoulder.  He feels his range of motion is pretty good, and has not noticed any issues with the strength.   Review of Systems: No fevers or chills No numbness or tingling No chest pain No shortness of breath No bowel or bladder dysfunction No GI distress No headaches   Medical History:  Past Medical History:  Diagnosis Date  . Anxiety   . Aortic stenosis   . Arthritis   . Asthma   . Back pain   . Cellulitis of skin with lymphangitis   . CHF (congestive heart failure) ([place])   . Chronic diastolic congestive heart failure ([place])   . Chronic venous insufficiency   . Constipation   . COPD (chronic obstructive pulmonary disease) ([place])   . Depression   . Dyspnea   . Essential hypertension   . Gout   . Heart murmur   . Hypertension   . Morbid obesity ([place])   . Obesity   . Pneumonia    walking pneumonia  . Pre-diabetes   . Prostatitis   . Pulmonary embolism ([place])   .  Pulmonary hypertension ([place])   . Pyelonephritis   . S/P aortic valve replacement with bioprosthetic valve 08/23/2017   25 mm Edwards Inspiris Resilia stented bovine pericardial tissue valve  . 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 Sleep apnea    cpap   . Thoracic ascending aortic aneurysm ([place])   . Thoracic ascending aortic aneurysm ([place])   . Tobacco abuse     Past Surgical History:  Procedure Laterality Date  . AORTIC VALVE REPLACEMENT N/A 08/23/2017   Procedure: AORTIC VALVE REPLACEMENT (AVR) USING INSPIRIS RESILIA AORTIC VALVE SIZE 25 MM;  Surgeon: Rexene Alberts, MD;  Location: [place];  Service: Open Heart Surgery;  Laterality: N/A;   . CARDIAC VALVE REPLACEMENT N/A    Phreesia 02/07/2020  . MULTIPLE EXTRACTIONS WITH ALVEOLOPLASTY N/A 07/23/2017   Procedure: Extraction of tooth #10 with alveoloplasty and gross debridement of remaining teeth;  Surgeon: Lenn Cal, DDS;  Location: [place];  Service: Oral Surgery;  Laterality: N/A;  . NO PAST SURGERIES    . PACEMAKER IMPLANT N/A 08/28/2017    St Jude Medical Assurity MRI conditional  dual-chamber pacemaker for symptomatic complete heart blockby Dr Rayann Heman  . TEE WITHOUT CARDIOVERSION N/A 07/16/2017   Procedure: TRANSESOPHAGEAL ECHOCARDIOGRAM (TEE);  Surgeon: Lelon Perla, MD;  Location[facility];  Service: Cardiovascular;  Laterality: N/A;  . TEE WITHOUT CARDIOVERSION N/A 08/23/2017   Procedure: TRANSESOPHAGEAL ECHOCARDIOGRAM (TEE);  Surgeon: Rexene Alberts, MD;  Location: [place];  Service: Open Heart Surgery;  Laterality: N/A;  . THORACIC AORTIC ANEURYSM REPAIR N/A 08/23/2017   Procedure: THORACIC ASCENDING ANEURYSM REPAIR (AAA) USING HEMASHIELD GOLD KNITTED MICROVEL DOUBLE VELOUR VASCULAR GRAFT D: 24 MM  L: 30 CM;  Surgeon: Rexene Alberts, MD;  Location: [place];  Service: Open Heart Surgery;  Laterality: N/A;    Family History  Problem Relation Age of Onset  . Hypertension Mother   . Alzheimer's disease Mother   . Heart attack Brother   . Diabetes Brother    Social History   Tobacco Use  . Smoking status: Former Smoker    Packs/day: 2.00    Years: 16.00    Pack years: 32.00    Types: Cigarettes    Quit date: 08/23/2017    Years since quitting: 3.1  . Smokeless tobacco: Never Used  Vaping Use  . Vaping Use: Never used  Substance Use Topics  . Alcohol use: No    Comment: have had alcohol in the past, not heavy  . Drug use: No    No Known Allergies  Current Meds  Medication Sig  . diclofenac Sodium (VOLTAREN) 1 % GEL Apply 2 g topically 4 (four) times daily.    Objective: BP 126/75   Pulse 63   Ht 5\' 11"  (1.803 m)   Wt (!) 487 lb (220.9 kg)    BMI 67.92 kg/m   Physical Exam:  General: Obese male.  No acute distress.  Alert and oriented. Gait: Slow.  Uses a cane to assist with ambulation.  Steady gait overall with a cane.  Evaluation of the right shoulder demonstrates no atrophy.  No obvious deformity.  He does have tenderness to palpation in the area of the [facility] joint.  Pain with cross body abduction.  Near full range of motion including forward flexion and abduction at his side.  Internal rotation to the lumbar spine.  In the 90 degree abducted position, he is able to get  to 90 degrees of external rotation, but has some pain with internal rotation, and is restricted to 45 degrees.  Negative belly press.  Supraspinatus and infraspinatus testing is 5/5.    IMAGING: I personally ordered and reviewed the following images\  X-rays of the right shoulder were obtained in clinic today and demonstrates no acute injury.  No significant glenohumeral arthritis.  Mild degenerative changes noted at the [facility] joint, including some narrowing, as well as superior osteophytes.  Impression: Mild right AC joint arthritis   New Medications:  Meds ordered this encounter  Medications  . diclofenac Sodium (VOLTAREN) 1 % GEL    Sig: Apply 2 g topically 4 (four) times daily.    Dispense:  150 g    Refill:  [place], MD  10/26/2020 9:17 AM

## 2020-11-03 NOTE — Telephone Encounter (Signed)
Pt was followed up for weekly remote monitoring bp health coaching, but did not have enough time to complete session  Will attempt to follow up later this afternoon as requested

## 2020-11-03 NOTE — Telephone Encounter (Signed)
Completed remote monitoring weekly followup  health coaching session.  Denies any issues with BP monitoring process and equipment nor weight/scale equipment  Success;   -continues to monitor bp and weight as directed by program -monitors diet for low salt and watches what he eats  -takes BP meds/other daily   Barrier: -Limited Exercise - not as active as he wants to bouts of not feeling well -Fluid retention that causes weight to fluctuate dramatically

## 2020-11-03 NOTE — Congregational Nurse Program (Signed)
Delivered groceries to pt to assist with BP management and demonstrations of healthy eating

## 2020-11-11 ENCOUNTER — Other Ambulatory Visit: Payer: Self-pay | Admitting: Internal Medicine

## 2020-11-11 DIAGNOSIS — M1A9XX0 Chronic gout, unspecified, without tophus (tophi): Secondary | ICD-10-CM

## 2020-11-12 ENCOUNTER — Inpatient Hospital Stay (HOSPITAL_COMMUNITY)
Admission: EM | Admit: 2020-11-12 | Discharge: 2020-11-15 | DRG: 378 | Disposition: A | Discharge status: Home Health | Payer: Medicare HMO | Attending: Internal Medicine | Admitting: Internal Medicine

## 2020-11-12 ENCOUNTER — Other Ambulatory Visit: Payer: Self-pay

## 2020-11-12 ENCOUNTER — Emergency Department (HOSPITAL_COMMUNITY): Payer: Medicare HMO

## 2020-11-12 ENCOUNTER — Ambulatory Visit (INDEPENDENT_AMBULATORY_CARE_PROVIDER_SITE_OTHER): Payer: Medicare HMO | Admitting: Internal Medicine

## 2020-11-12 ENCOUNTER — Encounter: Payer: Self-pay | Admitting: Internal Medicine

## 2020-11-12 ENCOUNTER — Encounter (HOSPITAL_COMMUNITY): Payer: Self-pay | Admitting: *Deleted

## 2020-11-12 VITALS — BP 137/70 | HR 63 | Temp 98.3°F | Resp 22 | Ht 71.0 in | Wt >= 6400 oz

## 2020-11-12 DIAGNOSIS — Z953 Presence of xenogenic heart valve: Secondary | ICD-10-CM

## 2020-11-12 DIAGNOSIS — Z8249 Family history of ischemic heart disease and other diseases of the circulatory system: Secondary | ICD-10-CM | POA: Diagnosis not present

## 2020-11-12 DIAGNOSIS — E119 Type 2 diabetes mellitus without complications: Secondary | ICD-10-CM | POA: Diagnosis present

## 2020-11-12 DIAGNOSIS — I5032 Chronic diastolic (congestive) heart failure: Secondary | ICD-10-CM | POA: Diagnosis present

## 2020-11-12 DIAGNOSIS — I10 Essential (primary) hypertension: Secondary | ICD-10-CM | POA: Diagnosis not present

## 2020-11-12 DIAGNOSIS — I714 Abdominal aortic aneurysm, without rupture: Secondary | ICD-10-CM | POA: Diagnosis present

## 2020-11-12 DIAGNOSIS — K921 Melena: Principal | ICD-10-CM | POA: Diagnosis present

## 2020-11-12 DIAGNOSIS — K922 Gastrointestinal hemorrhage, unspecified: Secondary | ICD-10-CM | POA: Diagnosis present

## 2020-11-12 DIAGNOSIS — I480 Paroxysmal atrial fibrillation: Secondary | ICD-10-CM

## 2020-11-12 DIAGNOSIS — D649 Anemia, unspecified: Secondary | ICD-10-CM | POA: Diagnosis not present

## 2020-11-12 DIAGNOSIS — I272 Pulmonary hypertension, unspecified: Secondary | ICD-10-CM | POA: Diagnosis present

## 2020-11-12 DIAGNOSIS — E1169 Type 2 diabetes mellitus with other specified complication: Secondary | ICD-10-CM

## 2020-11-12 DIAGNOSIS — K760 Fatty (change of) liver, not elsewhere classified: Secondary | ICD-10-CM | POA: Diagnosis present

## 2020-11-12 DIAGNOSIS — E785 Hyperlipidemia, unspecified: Secondary | ICD-10-CM | POA: Diagnosis present

## 2020-11-12 DIAGNOSIS — K644 Residual hemorrhoidal skin tags: Secondary | ICD-10-CM | POA: Diagnosis present

## 2020-11-12 DIAGNOSIS — G4733 Obstructive sleep apnea (adult) (pediatric): Secondary | ICD-10-CM | POA: Diagnosis present

## 2020-11-12 DIAGNOSIS — D500 Iron deficiency anemia secondary to blood loss (chronic): Secondary | ICD-10-CM

## 2020-11-12 DIAGNOSIS — J449 Chronic obstructive pulmonary disease, unspecified: Secondary | ICD-10-CM | POA: Diagnosis present

## 2020-11-12 DIAGNOSIS — K625 Hemorrhage of anus and rectum: Secondary | ICD-10-CM

## 2020-11-12 DIAGNOSIS — Z87891 Personal history of nicotine dependence: Secondary | ICD-10-CM

## 2020-11-12 DIAGNOSIS — R109 Unspecified abdominal pain: Secondary | ICD-10-CM | POA: Diagnosis not present

## 2020-11-12 DIAGNOSIS — D509 Iron deficiency anemia, unspecified: Secondary | ICD-10-CM | POA: Diagnosis present

## 2020-11-12 DIAGNOSIS — F419 Anxiety disorder, unspecified: Secondary | ICD-10-CM | POA: Diagnosis present

## 2020-11-12 DIAGNOSIS — Z20822 Contact with and (suspected) exposure to covid-19: Secondary | ICD-10-CM | POA: Diagnosis present

## 2020-11-12 DIAGNOSIS — Z6841 Body Mass Index (BMI) 40.0 and over, adult: Secondary | ICD-10-CM

## 2020-11-12 DIAGNOSIS — K648 Other hemorrhoids: Secondary | ICD-10-CM | POA: Diagnosis present

## 2020-11-12 DIAGNOSIS — Z7901 Long term (current) use of anticoagulants: Secondary | ICD-10-CM

## 2020-11-12 DIAGNOSIS — Z820 Family history of epilepsy and other diseases of the nervous system: Secondary | ICD-10-CM | POA: Diagnosis not present

## 2020-11-12 DIAGNOSIS — Z86711 Personal history of pulmonary embolism: Secondary | ICD-10-CM

## 2020-11-12 DIAGNOSIS — I110 Hypertensive heart disease with heart failure: Secondary | ICD-10-CM | POA: Diagnosis present

## 2020-11-12 DIAGNOSIS — I2510 Atherosclerotic heart disease of native coronary artery without angina pectoris: Secondary | ICD-10-CM | POA: Diagnosis present

## 2020-11-12 DIAGNOSIS — Z833 Family history of diabetes mellitus: Secondary | ICD-10-CM

## 2020-11-12 DIAGNOSIS — G609 Hereditary and idiopathic neuropathy, unspecified: Secondary | ICD-10-CM

## 2020-11-12 DIAGNOSIS — R195 Other fecal abnormalities: Secondary | ICD-10-CM | POA: Diagnosis not present

## 2020-11-12 DIAGNOSIS — F32A Depression, unspecified: Secondary | ICD-10-CM | POA: Diagnosis present

## 2020-11-12 DIAGNOSIS — Z79899 Other long term (current) drug therapy: Secondary | ICD-10-CM

## 2020-11-12 DIAGNOSIS — Z950 Presence of cardiac pacemaker: Secondary | ICD-10-CM

## 2020-11-12 DIAGNOSIS — Z7984 Long term (current) use of oral hypoglycemic drugs: Secondary | ICD-10-CM

## 2020-11-12 HISTORY — DX: Type 2 diabetes mellitus without complications: E11.9

## 2020-11-12 LAB — COMPREHENSIVE METABOLIC PANEL
ALT: 24 U/L (ref 0–44)
AST: 29 U/L (ref 15–41)
Albumin: 3.6 g/dL (ref 3.5–5.0)
Alkaline Phosphatase: 75 U/L (ref 38–126)
Anion gap: 7 (ref 5–15)
BUN: 23 mg/dL — ABNORMAL HIGH (ref 6–20)
CO2: 29 mmol/L (ref 22–32)
Calcium: 9 mg/dL (ref 8.9–10.3)
Chloride: 102 mmol/L (ref 98–111)
Creatinine, Ser: 1.26 mg/dL — ABNORMAL HIGH (ref 0.61–1.24)
GFR, Estimated: >60 mL/min (ref >60)
Glucose, Bld: 108 mg/dL — ABNORMAL HIGH (ref 70–99)
Potassium: 3.9 mmol/L (ref 3.5–5.1)
Sodium: 138 mmol/L (ref 135–145)
Total Bilirubin: 1 mg/dL (ref 0.3–1.2)
Total Protein: 7.4 g/dL (ref 6.5–8.1)

## 2020-11-12 LAB — CBC WITH DIFFERENTIAL/PLATELET
Abs Immature Granulocytes: 0.06 10*3/uL (ref 0.00–0.07)
Basophils Absolute: 0 10*3/uL (ref 0.0–0.1)
Basophils Relative: 0 %
Eosinophils Absolute: 0.3 10*3/uL (ref 0.0–0.5)
Eosinophils Relative: 3 %
HCT: 34.2 % — ABNORMAL LOW (ref 39.0–52.0)
Hemoglobin: 10.2 g/dL — ABNORMAL LOW (ref 13.0–17.0)
Immature Granulocytes: 1 %
Lymphocytes Relative: 18 %
Lymphs Abs: 2 10*3/uL (ref 0.7–4.0)
MCH: 25.4 pg — ABNORMAL LOW (ref 26.0–34.0)
MCHC: 29.8 g/dL — ABNORMAL LOW (ref 30.0–36.0)
MCV: 85.1 fL (ref 80.0–100.0)
Monocytes Absolute: 0.6 10*3/uL (ref 0.1–1.0)
Monocytes Relative: 6 %
Neutro Abs: 7.6 10*3/uL (ref 1.7–7.7)
Neutrophils Relative %: 72 %
Platelets: 257 10*3/uL (ref 150–400)
RBC: 4.02 MIL/uL — ABNORMAL LOW (ref 4.22–5.81)
RDW: 18 % — ABNORMAL HIGH (ref 11.5–15.5)
WBC: 10.6 10*3/uL — ABNORMAL HIGH (ref 4.0–10.5)
nRBC: 0 % (ref 0.0–0.2)

## 2020-11-12 LAB — CBC
HCT: 33.5 % — ABNORMAL LOW (ref 39.0–52.0)
Hemoglobin: 10.1 g/dL — ABNORMAL LOW (ref 13.0–17.0)
MCH: 24.8 pg — ABNORMAL LOW (ref 26.0–34.0)
MCHC: 30.1 g/dL (ref 30.0–36.0)
MCV: 82.1 fL (ref 80.0–100.0)
Platelets: 278 10*3/uL (ref 150–400)
RBC: 4.08 MIL/uL — ABNORMAL LOW (ref 4.22–5.81)
RDW: 17.9 % — ABNORMAL HIGH (ref 11.5–15.5)
WBC: 11.9 10*3/uL — ABNORMAL HIGH (ref 4.0–10.5)
nRBC: 0 % (ref 0.0–0.2)

## 2020-11-12 LAB — TYPE AND SCREEN
ABO/RH(D): A NEG
Antibody Screen: NEGATIVE

## 2020-11-12 LAB — HEMOGLOBIN A1C
Hgb A1c MFr Bld: 5.4 % (ref 4.8–5.6)
Mean Plasma Glucose: 108.28 mg/dL

## 2020-11-12 LAB — RESP PANEL BY RT-PCR (FLU A&B, COVID) ARPGX2
Influenza A by PCR: NEGATIVE
Influenza B by PCR: NEGATIVE
SARS Coronavirus 2 by RT PCR: NEGATIVE

## 2020-11-12 LAB — PROTIME-INR
INR: 1.6 — ABNORMAL HIGH (ref 0.8–1.2)
Prothrombin Time: 19.4 seconds — ABNORMAL HIGH (ref 11.4–15.2)

## 2020-11-12 LAB — LIPASE, BLOOD: Lipase: 29 U/L (ref 11–51)

## 2020-11-12 LAB — POC OCCULT BLOOD, ED: Fecal Occult Bld: POSITIVE — AB

## 2020-11-12 LAB — HIV ANTIBODY (ROUTINE TESTING W REFLEX): HIV Screen 4th Generation wRfx: NONREACTIVE

## 2020-11-12 LAB — GLUCOSE, CAPILLARY: Glucose-Capillary: 92 mg/dL (ref 70–99)

## 2020-11-12 MED ORDER — TORSEMIDE 20 MG PO TABS
40.0000 mg | ORAL_TABLET | Freq: Every day | ORAL | Status: DC
  Administered 2020-11-13 – 2020-11-15 (×3): 40 mg via ORAL
  Filled 2020-11-12 (×3): qty 2

## 2020-11-12 MED ORDER — ALLOPURINOL 300 MG PO TABS
300.0000 mg | ORAL_TABLET | Freq: Every day | ORAL | Status: DC
  Administered 2020-11-13 – 2020-11-15 (×3): 300 mg via ORAL
  Filled 2020-11-12 (×3): qty 1

## 2020-11-12 MED ORDER — LACTATED RINGERS IV BOLUS
1000.0000 mL | Freq: Once | INTRAVENOUS | Status: AC
  Administered 2020-11-12: 1000 mL via INTRAVENOUS

## 2020-11-12 MED ORDER — ACETAMINOPHEN 325 MG PO TABS
650.0000 mg | ORAL_TABLET | Freq: Four times a day (QID) | ORAL | Status: DC | PRN
  Administered 2020-11-13 – 2020-11-14 (×2): 650 mg via ORAL
  Filled 2020-11-12 (×2): qty 2

## 2020-11-12 MED ORDER — BUSPIRONE HCL 5 MG PO TABS
10.0000 mg | ORAL_TABLET | Freq: Three times a day (TID) | ORAL | Status: DC
  Administered 2020-11-12 – 2020-11-15 (×8): 10 mg via ORAL
  Filled 2020-11-12 (×8): qty 2

## 2020-11-12 MED ORDER — CITALOPRAM HYDROBROMIDE 40 MG PO TABS
40.0000 mg | ORAL_TABLET | Freq: Every day | ORAL | Status: DC
  Administered 2020-11-13 – 2020-11-15 (×3): 40 mg via ORAL
  Filled 2020-11-12 (×3): qty 1

## 2020-11-12 MED ORDER — PANTOPRAZOLE SODIUM 40 MG IV SOLR
40.0000 mg | Freq: Two times a day (BID) | INTRAVENOUS | Status: DC
  Administered 2020-11-12 – 2020-11-13 (×4): 40 mg via INTRAVENOUS
  Filled 2020-11-12 (×5): qty 40

## 2020-11-12 MED ORDER — IOHEXOL 300 MG/ML  SOLN
100.0000 mL | Freq: Once | INTRAMUSCULAR | Status: AC | PRN
  Administered 2020-11-12: 100 mL via INTRAVENOUS

## 2020-11-12 MED ORDER — INSULIN ASPART 100 UNIT/ML IJ SOLN: 0.0000 [IU] | Freq: Every day | INTRAMUSCULAR | Status: DC

## 2020-11-12 MED ORDER — PANTOPRAZOLE SODIUM 40 MG PO TBEC: 40.0000 mg | DELAYED_RELEASE_TABLET | Freq: Every day | ORAL | Status: DC

## 2020-11-12 MED ORDER — METOPROLOL TARTRATE 100 MG PO TABS
100.0000 mg | ORAL_TABLET | Freq: Two times a day (BID) | ORAL | Status: DC
  Administered 2020-11-12 – 2020-11-15 (×6): 100 mg via ORAL
  Filled 2020-11-12 (×6): qty 1

## 2020-11-12 MED ORDER — INSULIN ASPART 100 UNIT/ML IJ SOLN
0.0000 [IU] | Freq: Three times a day (TID) | INTRAMUSCULAR | Status: DC
  Administered 2020-11-14: 3 [IU] via SUBCUTANEOUS

## 2020-11-12 MED ORDER — ATORVASTATIN CALCIUM 40 MG PO TABS
40.0000 mg | ORAL_TABLET | Freq: Every day | ORAL | Status: DC
  Administered 2020-11-12 – 2020-11-15 (×4): 40 mg via ORAL
  Filled 2020-11-12 (×4): qty 1

## 2020-11-12 NOTE — ED Triage Notes (Signed)
Abdominal pain with black stools for 3 weeks, states he has a history of low hgb, also c/o being weak

## 2020-11-12 NOTE — Patient Instructions (Signed)
Please go to ER for evaluation of GI bleeding.

## 2020-11-12 NOTE — Assessment & Plan Note (Addendum)
On Metoprolol On Xarelto, need to hold it until further evaluation in ER Follows up with Cardiology

## 2020-11-12 NOTE — ED Notes (Signed)
Patient transported to CT 

## 2020-11-12 NOTE — ED Notes (Signed)
Report to Arthur with [facility]

## 2020-11-12 NOTE — Assessment & Plan Note (Signed)
Likely GI blood loss On Xarelto for A Fib On iron supplement Referred to GI for possible urgent GI evaluation

## 2020-11-12 NOTE — ED Notes (Signed)
Attempted to call report, rang multiple times with no answer

## 2020-11-12 NOTE — H&P (Signed)
TRH H&P   Patient Demographics:    Reginald [name], is a 50 y.o. male  MRN: [id number]   DOB - February 02, 1971  Admit Date - 11/12/2020  Outpatient Primary MD for the patient is [name], MD  Referring MD/NP/PA: Dr Karle Starch  Outpatient Specialists: Primary Cardiologist: Allred/Koneswaran  Patient coming from: Home  Chief Complaint  Patient presents with   Abdominal Pain      HPI:    Reginald [name]  is a 50 y.o. male,  Hypertension, Pre-diabetes, CHF (diastolic), Aortic stenosis s/p AVR, AAA, Copd, pulmonary hypertension, OSA, A. fib on anticoagulation, PE , COPD, patient with history of anemia, where he was started on oral iron supplements 3 to 4 weeks ago, patient reports black, liquid stools for last 3 weeks, as well he does report intermittent bright red blood per rectum for the last couple weeks as well, denies any constipations or any hard stools, he denies abdominal pain, but he does report some discomfort, he denies nausea, vomiting, fever, or diarrhea, patient with no colonoscopy/endoscopy in the past, he denies any coffee-ground emesis, report he is scheduled to see some GI physician for teen follow-up (likely screening colonoscopy given his age). - in ED patient was noted to be Hemoccult positive, per ED physician he had dark-colored stool, but no bright red blood on rectal exam, his hemoglobin was noted to be 10.2, which is around his baseline, given he is on anticoagulation, and Hemoccult positive, [facility] hospital[facility] consulted for further evaluation.    Review of systems:    In addition to the HPI above No Fever-chills, No Headache, No changes with Vision or hearing, No problems swallowing food or Liquids, No Chest pain, Cough or Shortness of Breath, No Abdominal pain, No Nausea or Vommitting, Bowel movements are regular, He does report dark-colored stools, with bright red  blood per rectum intermittently. No dysuria, No new skin rashes or bruises, No new joints pains-aches,  No new weakness, tingling, numbness in any extremity, No recent weight gain or loss, No polyuria, polydypsia or polyphagia, No significant Mental Stressors.  A full 10 point Review of Systems was done, except as stated above, all other Review of Systems were negative.   With Past History of the following :    Past Medical History:  Diagnosis Date   Anxiety    Aortic stenosis    Arthritis    Asthma    Back pain    Cellulitis of skin with lymphangitis    CHF (congestive heart failure) (HCC)    Chronic diastolic congestive heart failure (HCC)    Chronic venous insufficiency    Constipation    COPD (chronic obstructive pulmonary disease) (HCC)    Depression    Dyspnea    Essential hypertension    Gout    Heart murmur    Hypertension    Morbid obesity ([place])    Obesity  Pneumonia    walking pneumonia   Pre-diabetes    Prostatitis    Pulmonary embolism (HCC)    Pulmonary hypertension (HCC)    Pyelonephritis    S/P aortic valve replacement with bioprosthetic valve 08/23/2017   25 mm Edwards Inspiris Resilia stented bovine pericardial tissue valve   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   24 mm Hemashield supracoronary straight graft    Sleep apnea    cpap    Thoracic ascending aortic aneurysm [facility])    Thoracic ascending aortic aneurysm ([place])    Tobacco abuse       Past Surgical History:  Procedure Laterality Date   AORTIC VALVE REPLACEMENT N/A 08/23/2017   Procedure: AORTIC VALVE REPLACEMENT (AVR) USING INSPIRIS RESILIA AORTIC VALVE SIZE 25 MM;  Surgeon: Rexene Alberts, MD;  Location: [place];  Service: Open Heart Surgery;  Laterality: N/A;   CARDIAC VALVE REPLACEMENT N/A    Phreesia 02/07/2020   MULTIPLE EXTRACTIONS WITH ALVEOLOPLASTY N/A 07/23/2017   Procedure: Extraction of tooth #10 with alveoloplasty and gross debridement of remaining teeth;  Surgeon:  Lenn Cal, DDS;  Location: [place];  Service: Oral Surgery;  Laterality: N/A;   NO PAST SURGERIES     PACEMAKER IMPLANT N/A 08/28/2017    St Jude Medical Assurity MRI conditional  dual-chamber pacemaker for symptomatic complete heart blockby Dr Rayann Heman   TEE WITHOUT CARDIOVERSION N/A 07/16/2017   Procedure: TRANSESOPHAGEAL ECHOCARDIOGRAM (TEE);  Surgeon: Lelon Perla, MD;  Location[facility] ENDOSCOPY;  Service: Cardiovascular;  Laterality: N/A;   TEE WITHOUT CARDIOVERSION N/A 08/23/2017   Procedure: TRANSESOPHAGEAL ECHOCARDIOGRAM (TEE);  Surgeon: Rexene Alberts, MD;  Location: [place];  Service: Open Heart Surgery;  Laterality: N/A;   THORACIC AORTIC ANEURYSM REPAIR N/A 08/23/2017   Procedure: THORACIC ASCENDING ANEURYSM REPAIR (AAA) USING HEMASHIELD GOLD KNITTED MICROVEL DOUBLE VELOUR VASCULAR GRAFT D: 24 MM  L: 30 CM;  Surgeon: Rexene Alberts, MD;  Location: [place];  Service: Open Heart Surgery;  Laterality: N/A;      Social History:     Social History   Tobacco Use   Smoking status: Former    Packs/day: 2.00    Years: 16.00    Pack years: 32.00    Types: Cigarettes    Quit date: 08/23/2017    Years since quitting: 3.2   Smokeless tobacco: Never  Substance Use Topics   Alcohol use: No    Comment: have had alcohol in the past, not heavy       Family History :     Family History  Problem Relation Age of Onset   Hypertension Mother    Alzheimer's disease Mother    Heart attack Brother    Diabetes Brother      Home Medications:   Prior to Admission medications   Medication Sig Start Date End Date Taking? Authorizing Provider  acetaminophen (TYLENOL) 325 MG tablet Take 650 mg by mouth every 6 (six) hours as needed for mild pain.   Yes [provider]  albuterol (PROVENTIL) (2.5 MG/3ML) 0.083% nebulizer solution INHALE 1 VIAL VIA NEBULIZER EVERY 6 HOURS AS NEEDED FOR WHEEZING OR SHORTNESS OF BREATH 08/05/20  Yes [name], MD  albuterol (VENTOLIN HFA) 108 (90  Base) MCG/ACT inhaler Inhale 2 puffs into the lungs every 6 (six) hours as needed for wheezing or shortness of breath. 06/10/20  Yes [name], MD  allopurinol (ZYLOPRIM) 300 MG tablet TAKE 1 TABLET EVERY DAY 11/11/20  Yes Posey Pronto, Rutwik  K, MD  ammonium lactate (AMLACTIN) 12 % lotion Apply 1 application topically as needed for dry skin. 10/13/20  Yes Felipa Furnace, DPM  atorvastatin (LIPITOR) 40 MG tablet TAKE 1 TABLET EVERY DAY 09/20/20  Yes [name], MD  Blood Glucose Monitoring Suppl (TRUE METRIX METER) w/Device KIT USE AS DIRECTED 08/19/20  Yes [name], MD  busPIRone (BUSPAR) 10 MG tablet TAKE 1 TABLET THREE TIMES DAILY 10/19/20  Yes [name], MD  citalopram (CELEXA) 40 MG tablet TAKE 1 TABLET EVERY DAY 10/19/20  Yes [name], MD  diclofenac Sodium (VOLTAREN) 1 % GEL Apply 2 g topically 4 (four) times daily. 10/26/20  Yes Mordecai Rasmussen, MD  fexofenadine (ALLEGRA) 180 MG tablet Take 180 mg by mouth daily.   Yes [provider]  gabapentin (NEURONTIN) 100 MG capsule Take 1 capsule in the morning and 2 capsules in the evening. 10/01/20  Yes [name], MD  glipiZIDE (GLUCOTROL XL) 5 MG 24 hr tablet Take 1 tablet (5 mg total) by mouth daily with breakfast. 10/08/20  Yes [name], MD  Iron, Ferrous Sulfate, 325 (65 Fe) MG TABS Take 325 mg by mouth daily. 10/08/20  Yes [name], MD  Krill Oil 350 MG CAPS Take 1 capsule by mouth in the morning and at bedtime.    Yes [provider]  lisinopril (ZESTRIL) 20 MG tablet Take 1 tablet (20 mg total) by mouth daily. 04/21/20 11/12/20 Yes Strader, Fransisco Hertz, PA-C  loratadine (CLARITIN) 10 MG tablet Take 10 mg by mouth daily as needed for allergies.   Yes [provider]  metFORMIN (GLUCOPHAGE) 850 MG tablet Take 1 tablet (850 mg total) by mouth 2 (two) times daily with a meal. 10/08/20  Yes [name], MD  metolazone (ZAROXOLYN) 5 MG tablet PRN Patient taking differently: Take 5 mg by  mouth daily as needed (swelling). PRN 06/21/20  Yes Allred, Jeneen Rinks, MD  metoprolol tartrate (LOPRESSOR) 100 MG tablet Take 1 tablet (100 mg total) by mouth 2 (two) times daily. 02/26/20  Yes Strader, [place], PA-C  mupirocin ointment (BACTROBAN) 2 % Apply 1 application topically 2 (two) times daily. 10/01/20  Yes [name], MD  pantoprazole (PROTONIX) 40 MG tablet Take 1 tablet (40 mg total) by mouth daily. 07/14/20  Yes [name], MD  potassium chloride SA (KLOR-CON) 20 MEQ tablet TAKE 3 TABS DAILY AND TAKE AN ADDITIONAL 1 TAB ON THE DAY YOU TAKE YOUR WEEKLY METOLAZONE DOSE 05/13/20  Yes Strader, [place] M, PA-C  rivaroxaban (XARELTO) 20 MG TABS tablet TAKE 1 Tablet BY MOUTH ONCE EVERY DAY WITH SUPPER 10/20/20  Yes Josue Hector, MD  terbinafine (LAMISIL) 250 MG tablet Take 1 tablet (250 mg total) by mouth daily. 10/01/20  Yes [name], MD  torsemide (DEMADEX) 20 MG tablet Take 2 tablets (40 mg total) by mouth 2 (two) times daily. 04/21/20 11/12/20 Yes Strader, Fransisco Hertz, PA-C  TRUE METRIX BLOOD GLUCOSE TEST test strip TEST ONE TIME DAILY FOR DIABETES 10/25/20  Yes [name], MD  TRUEplus Lancets 33G MISC USE TO TEST ONE TIME DAILY FOR DIABETES 10/25/20  Yes [name], MD  docusate sodium (COLACE) 100 MG capsule Take 200 mg by mouth daily as needed for mild constipation. Patient not taking: No sig reported    [provider]  guaiFENesin (MUCINEX) 600 MG 12 hr tablet Take 600 mg by mouth 2 (two) times daily. Patient  not taking: Reported on 11/12/2020    [provider]     Allergies:    No Known Allergies   Physical Exam:   Vitals  Blood pressure (!) 129/93, pulse 60, temperature 97.9 F (36.6 C), resp. rate 20, height '5\' 11"'  (1.803 m), weight (!) 221.8 kg, SpO2 97 %.   1. General [place] obese male, laying in bed, in no apparent distress  2. Normal affect and insight, Not Suicidal or Homicidal, Awake Alert, Oriented X 3.  3. No F.N deficits,  ALL C.Nerves Intact, Strength 5/5 all 4 extremities, Sensation intact all 4 extremities, Plantars down going.  4. Ears and Eyes appear Normal, Conjunctivae clear, PERRLA. Moist Oral Mucosa.  5. Supple Neck, No JVD, No cervical lymphadenopathy appriciated, No Carotid Bruits.  6. Symmetrical Chest wall movement, Good air movement bilaterally, CTAB.  7. RRR, No Gallops, Rubs, No Parasternal Heave.  +1 edema  8. Positive Bowel Sounds, Abdomen Soft, No tenderness, No organomegaly appriciated,No rebound -guarding or rigidity.  9.  No Cyanosis, Normal Skin Turgor, No Skin Rash or Bruise.  10. Good muscle tone,  joints appear normal , no effusions, Normal ROM.  11. No Palpable Lymph Nodes in Neck or Axillae   Data Review:    CBC Recent Labs  Lab 11/12/20 1132  WBC 10.6*  HGB 10.2*  HCT 34.2*  PLT 257  MCV 85.1  MCH 25.4*  MCHC 29.8*  RDW 18.0*  LYMPHSABS 2.0  MONOABS 0.6  EOSABS 0.3  BASOSABS 0.0   ------------------------------------------------------------------------------------------------------------------  Chemistries  Recent Labs  Lab 11/12/20 1132  NA 138  K 3.9  CL 102  CO2 29  GLUCOSE 108*  BUN 23*  CREATININE 1.26*  CALCIUM 9.0  AST 29  ALT 24  ALKPHOS 75  BILITOT 1.0   ------------------------------------------------------------------------------------------------------------------ estimated creatinine clearance is 134.3 mL/min (A) (by C-G formula based on SCr of 1.26 mg/dL (H)). ------------------------------------------------------------------------------------------------------------------ No results for input(s): TSH, T4TOTAL, T3FREE, THYROIDAB in the last 72 hours.  Invalid input(s): FREET3  Coagulation profile Recent Labs  Lab 11/12/20 1132  INR 1.6*   ------------------------------------------------------------------------------------------------------------------- No results for input(s): DDIMER in the last 72  hours. -------------------------------------------------------------------------------------------------------------------  Cardiac Enzymes No results for input(s): CKMB, TROPONINI, MYOGLOBIN in the last 168 hours.  Invalid input(s): CK ------------------------------------------------------------------------------------------------------------------    Component Value Date/Time   BNP 70.0 07/09/2020 2131     ---------------------------------------------------------------------------------------------------------------  Urinalysis    Component Value Date/Time   COLORURINE YELLOW 03/16/2019 0230   APPEARANCEUR HAZY (A) 03/16/2019 0230   LABSPEC 1.016 03/16/2019 0230   PHURINE 5.0 03/16/2019 0230   GLUCOSEU NEGATIVE 03/16/2019 0230   HGBUR NEGATIVE 03/16/2019 0230   BILIRUBINUR negative 03/10/2020 0926   BILIRUBINUR neg 07/18/2018 0855   KETONESUR negative 03/10/2020 0926   KETONESUR NEGATIVE 03/16/2019 0230   PROTEINUR 30 (A) 03/16/2019 0230   UROBILINOGEN 0.2 03/10/2020 0926   UROBILINOGEN 2.0 (H) 03/08/2019 1123   NITRITE Negative 03/10/2020 0926   NITRITE NEGATIVE 03/16/2019 0230   LEUKOCYTESUR Negative 03/10/2020 0926   LEUKOCYTESUR NEGATIVE 03/16/2019 0230    ----------------------------------------------------------------------------------------------------------------   Imaging Results:    CT Abdomen Pelvis W Contrast  Result Date: 11/12/2020 CLINICAL DATA:  Abdominal pain with black stools for 3 weeks. EXAM: CT ABDOMEN AND PELVIS WITH CONTRAST TECHNIQUE: Multidetector CT imaging of the abdomen and pelvis was performed using the standard protocol following bolus administration of intravenous contrast. CONTRAST:  154m OMNIPAQUE IOHEXOL 300 MG/ML  SOLN COMPARISON:  03/08/2019. FINDINGS: Lower chest: Clear lung bases. Hepatobiliary:  Liver enlarged. No mass or focal lesion. Mildly distended gallbladder. Small densities noted in the dependent gallbladder consistent with  stones. No wall thickening. No inflammation. No duct dilation. Pancreas: Unremarkable. No pancreatic ductal dilatation or surrounding inflammatory changes. Spleen: Spleen enlarged, 20 cm in greatest transverse dimension. No splenic mass or focal lesion. 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 No other adrenal masses. Kidneys normal in size, orientation and position with symmetric enhancement and excretion. No mass, stone or hydronephrosis. Normal ureters. Normal bladder. Stomach/Bowel: Normal stomach. Small bowel and colon are normal in caliber. No wall thickening. No inflammation. No evidence of appendicitis. Vascular/Lymphatic: Prominent and mildly enlarged upper abdominal lymph nodes. Peri celiac node measures 1.9 cm in short axis. Posterior gastrohepatic ligament lymph node measures 1.5 cm in short axis. Nodes are similar to the prior CT. Mild aortic atherosclerosis. No aneurysm. Reproductive: Unremarkable. Other: No abdominal wall hernia. No ascites. Nonspecific subcutaneous soft tissue edema noted along the abdomen and flanks. No fluid collection to suggest an abscess. Musculoskeletal: No fracture or acute finding.  No bone lesion. IMPRESSION: 1. No acute findings within the abdomen or pelvis.  No abscess. 2. Hepato splenomegaly stable compared to the prior CT. 3. Prominent and mildly enlarged upper abdominal lymph nodes, presumed reactive, and essentially stable from the prior CT. 4. Mild aortic atherosclerosis. Electronically Signed   By: Lajean Manes M.D.   On: 11/12/2020 15:02     EKG:  Vent. rate 60 BPM PR interval 181 ms QRS duration 162 ms QT/QTcB 472/472 ms P-R-T axes 53 116 41 Sinus rhythm Right bundle branch block   Assessment & Plan:    Active Problems:   Essential hypertension   Chronic heart failure with preserved ejection fraction (HFpEF) (HCC)   COPD (chronic obstructive pulmonary disease) (HCC)   Anxiety   DM2 (diabetes mellitus, type 2) (HCC)   GI  bleed  /Hematochezia -He does report some melena and bright red blood per rectum, hemoglobin at baseline, the last 50-month 10.4> 10.2, his baseline before is around 12, -Was recently started on iron, this may explain dark stools, but not bright red blood per rectum. -We will hold Eliquis for now. -Monitor H&H closely. -We will start on IV Protonix 40 mg twice daily -We will need GI evaluation, discussed with GI in [place], given his extensive cardiac history, and morbid obesity with weight of 221 KG, [facility] even [facility] BMI of 68 and extensive cardiac history, have discussed with [facility], they will evaluate in a.m.  Chronic diastolic CHF -Is with some lower extremity edema, but he reports this is better than baseline, I we will continue his torsemide at a lower dose given soft blood pressure, so decreased from 40 mg twice daily to once daily.  Aortic stenosis -  He is s/p bovine tissue AVR in 08/2017 with repair of ascending thoracic aortic aneurysm.  Recent echocardiogram in 07/2020 showed a mean gradient of 26 mmHg which was similar to prior imaging in 2019 with no evidence of regurgitation  Dismal A. fib -Currently normal sinus rhythm, holding Xarelto when possible GI bleed.  Complete heart block -Status post permanent pacemaker 08/2017, he is followed by Dr. ARayann Heman OSA -Continue with CPAP  Diabetes mellitus -We will hold glipizide and metformin, and will start on insulin sliding scale  Hyperlipidemia -Continue with statin   DVT Prophylaxis SCDs   AM Labs Ordered, also please review Full Orders  Family Communication: Admission, patients condition and plan of care including tests being ordered have been discussed with the patient  who indicate understanding and agree with the plan and Code Status.  Code Status Full  Likely DC to  home  Condition GUARDED    Consults called: Discussed with GI Dr. Gala Romney at [facility], and GI [place] at [facility]  Admission status: Observation  Time spent in minutes : 60 minutes   Phillips Climes M.D on 11/12/2020 at [place] PM   [facility] - Office  [phone]

## 2020-11-12 NOTE — Assessment & Plan Note (Signed)
Recurrent BRBPR, intermittent, once every other week or so New onset black-tarry stool, although it could be due to iron supplement, but in the setting of fatigue, weakness and worse dyspnea, will need to get urgent evaluation Last CBC showed drop in Hgb Referred to GI for evaluation/colonoscopy On Protonix

## 2020-11-12 NOTE — Progress Notes (Signed)
Acute Office Visit  Subjective:    Patient ID: Reginald [name], male    DOB: [birth year], 50 y.o.   MRN: [id number]  Chief Complaint  Patient presents with   Fatigue    Weakness x 2 weeks    Abdominal Pain    X 2 weeks    Diarrhea    Black, watery stools since starting iron supplement     HPI Patient is in today for evaluation of fatigue, weakness and worse dyspnea for last 2 weeks. He had been having BRBPR lately and was referred to GI and started on iron supplement. He started having black and watery stools since then. Of note, he takes Xarelto for paroxysmal A Fib. He also has h/o OSA, for which he needs to get CPAP. I had discussion with patient regarding urgent need for evaluation in ER in the setting of worsening dyspnea and fatigue, which could be a sign significant blood loss anemia in the setting of chronic GI bleeding. He expressed understanding and agreed to go to ER. Discussed with ER Physician at [facility].  Past Medical History:  Diagnosis Date   Anxiety    Aortic stenosis    Arthritis    Asthma    Back pain    Cellulitis of skin with lymphangitis    CHF (congestive heart failure) (HCC)    Chronic diastolic congestive heart failure (HCC)    Chronic venous insufficiency    Constipation    COPD (chronic obstructive pulmonary disease) (HCC)    Depression    Dyspnea    Essential hypertension    Gout    Heart murmur    Hypertension    Morbid obesity ([place])    Obesity    Pneumonia    walking pneumonia   Pre-diabetes    Prostatitis    Pulmonary embolism ([place])    Pulmonary hypertension (HCC)    Pyelonephritis    S/P aortic valve replacement with bioprosthetic valve 08/23/2017   25 mm Edwards Inspiris Resilia stented bovine pericardial tissue valve   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   24 mm Hemashield supracoronary straight graft    Sleep apnea    cpap    Thoracic ascending aortic aneurysm [facility])    Thoracic ascending aortic aneurysm (HCC)     Tobacco abuse     Past Surgical History:  Procedure Laterality Date   AORTIC VALVE REPLACEMENT N/A 08/23/2017   Procedure: AORTIC VALVE REPLACEMENT (AVR) USING INSPIRIS RESILIA AORTIC VALVE SIZE 25 MM;  Surgeon: Rexene Alberts, MD;  Location: [place];  Service: Open Heart Surgery;  Laterality: N/A;   CARDIAC VALVE REPLACEMENT N/A    [employer]reesia 02/07/2020   MULTIPLE EXTRACTIONS WITH ALVEOLOPLASTY N/A 07/23/2017   Procedure: Extraction of tooth #10 with alveoloplasty and gross debridement of remaining teeth;  Surgeon: Lenn Cal, DDS;  Location: [place];  Service: Oral Surgery;  Laterality: N/A;   NO PAST SURGERIES     PACEMAKER IMPLANT N/A 08/28/2017    St Jude Medical Assurity MRI conditional  dual-chamber pacemaker for symptomatic complete heart blockby Dr Rayann Heman   TEE WITHOUT CARDIOVERSION N/A 07/16/2017   Procedure: TRANSESOPHAGEAL ECHOCARDIOGRAM (TEE);  Surgeon: Lelon Perla, MD;  Location: [facility] ENDOSCOPY;  Service: Cardiovascular;  Laterality: N/A;   TEE WITHOUT CARDIOVERSION N/A 08/23/2017   Procedure: TRANSESOPHAGEAL ECHOCARDIOGRAM (TEE);  Surgeon: Rexene Alberts, MD;  Location: [place];  Service: Open Heart Surgery;  Laterality: N/A;   THORACIC AORTIC ANEURYSM REPAIR N/A 08/23/2017  Procedure: THORACIC ASCENDING ANEURYSM REPAIR (AAA) USING HEMASHIELD GOLD KNITTED MICROVEL DOUBLE VELOUR VASCULAR GRAFT D: 24 MM  L: 30 CM;  Surgeon: Rexene Alberts, MD;  Location: [place];  Service: Open Heart Surgery;  Laterality: N/A;    Family History  Problem Relation Age of Onset   Hypertension Mother    Alzheimer's disease Mother    Heart attack Brother    Diabetes Brother     Social History   Socioeconomic History   Marital status: Divorced    Spouse name: Not on file   Number of children: Not on file   Years of education: Not on file   Highest education level: Not on file  Occupational History   Not on file  Tobacco Use   Smoking status: Former    Packs/day: 2.00    Years: 16.00     Pack years: 32.00    Types: Cigarettes    Quit date: 08/23/2017    Years since quitting: 3.2   Smokeless tobacco: Never  Vaping Use   Vaping Use: Never used  Substance and Sexual Activity   Alcohol use: No    Comment: have had alcohol in the past, not heavy   Drug use: No   Sexual activity: Not on file  Other Topics Concern   Not on file  Social History Narrative   Not on file   Social Determinants of Health   Financial Resource Strain: Low Risk    Difficulty of Paying Living Expenses: Not very hard  Food Insecurity: No Food Insecurity   Worried About Running Out of Food in the Last Year: Never true   [place] in the Last Year: Never true  Transportation Needs: No Transportation Needs   Lack of Transportation (Medical): No   Lack of Transportation (Non-Medical): No  Physical Activity: Inactive   Days of Exercise per Week: 0 days   Minutes of Exercise per Session: 0 min  Stress: Stress Concern Present   Feeling of Stress : Rather much  Social Connections: Socially Isolated   Frequency of Communication with Friends and Family: More than three times a week   Frequency of Social Gatherings with Friends and Family: Once a week   Attends Religious Services: Never   Marine scientist or Organizations: No   Attends Music therapist: Never   Marital Status: Divorced  Human resources officer Violence: Not At Risk   Fear of Current or Ex-Partner: No   Emotionally Abused: No   Physically Abused: No   Sexually Abused: No    Outpatient Medications Prior to Visit  Medication Sig Dispense Refill   acetaminophen (TYLENOL) 325 MG tablet Take 650 mg by mouth every 6 (six) hours as needed for mild pain.     albuterol (PROVENTIL) (2.5 MG/3ML) 0.083% nebulizer solution INHALE 1 VIAL VIA NEBULIZER EVERY 6 HOURS AS NEEDED FOR WHEEZING OR SHORTNESS OF BREATH 270 mL 1   albuterol (VENTOLIN HFA) 108 (90 Base) MCG/ACT inhaler Inhale 2 puffs into the lungs every 6 (six) hours as  needed for wheezing or shortness of breath. 8 g 3   allopurinol (ZYLOPRIM) 300 MG tablet TAKE 1 TABLET EVERY DAY 90 tablet 0   ammonium lactate (AMLACTIN) 12 % lotion Apply 1 application topically as needed for dry skin. 400 g 0   atorvastatin (LIPITOR) 40 MG tablet TAKE 1 TABLET EVERY DAY 90 tablet 0   Blood Glucose Monitoring Suppl (TRUE METRIX METER) w/Device KIT USE AS DIRECTED 1 kit  0   busPIRone (BUSPAR) 10 MG tablet TAKE 1 TABLET THREE TIMES DAILY 270 tablet 0   citalopram (CELEXA) 40 MG tablet TAKE 1 TABLET EVERY DAY 90 tablet 0   diclofenac Sodium (VOLTAREN) 1 % GEL Apply 2 g topically 4 (four) times daily. 150 g 1   docusate sodium (COLACE) 100 MG capsule Take 200 mg by mouth daily as needed for mild constipation.     fexofenadine (ALLEGRA) 180 MG tablet Take 180 mg by mouth daily.     gabapentin (NEURONTIN) 100 MG capsule Take 1 capsule in the morning and 2 capsules in the evening. 270 capsule 1   glipiZIDE (GLUCOTROL XL) 5 MG 24 hr tablet Take 1 tablet (5 mg total) by mouth daily with breakfast. 30 tablet 5   guaiFENesin (MUCINEX) 600 MG 12 hr tablet Take 600 mg by mouth 2 (two) times daily.     Iron, Ferrous Sulfate, 325 (65 Fe) MG TABS Take 325 mg by mouth daily. 90 tablet 1   Krill Oil 350 MG CAPS Take 1 capsule by mouth in the morning and at bedtime.      loratadine (CLARITIN) 10 MG tablet Take 10 mg by mouth daily as needed for allergies.     metFORMIN (GLUCOPHAGE) 850 MG tablet Take 1 tablet (850 mg total) by mouth 2 (two) times daily with a meal. 180 tablet 1   metolazone (ZAROXOLYN) 5 MG tablet PRN (Patient taking differently: Take 5 mg by mouth daily as needed (swelling). PRN) 90 tablet 3   metoprolol tartrate (LOPRESSOR) 100 MG tablet Take 1 tablet (100 mg total) by mouth 2 (two) times daily. 180 tablet 3   mupirocin ointment (BACTROBAN) 2 % Apply 1 application topically 2 (two) times daily. 22 g 0   pantoprazole (PROTONIX) 40 MG tablet Take 1 tablet (40 mg total) by mouth  daily. 90 tablet 3   potassium chloride SA (KLOR-CON) 20 MEQ tablet TAKE 3 TABS DAILY AND TAKE AN ADDITIONAL 1 TAB ON THE DAY YOU TAKE YOUR WEEKLY METOLAZONE DOSE 283 tablet 3   rivaroxaban (XARELTO) 20 MG TABS tablet TAKE 1 Tablet BY MOUTH ONCE EVERY DAY WITH SUPPER 90 tablet 3   Semaglutide 7 MG TABS Take 1 tablet by mouth daily.     terbinafine (LAMISIL) 250 MG tablet Take 1 tablet (250 mg total) by mouth daily. 28 tablet 2   TRUE METRIX BLOOD GLUCOSE TEST test strip TEST ONE TIME DAILY FOR DIABETES 100 strip 5   TRUEplus Lancets 33G MISC USE TO TEST ONE TIME DAILY FOR DIABETES 100 each 5   lisinopril (ZESTRIL) 20 MG tablet Take 1 tablet (20 mg total) by mouth daily. 90 tablet 3   torsemide (DEMADEX) 20 MG tablet Take 2 tablets (40 mg total) by mouth 2 (two) times daily. 360 tablet 3   No facility-administered medications prior to visit.    No Known Allergies  Review of Systems  Constitutional:  Negative for chills and fever.  HENT:  Negative for congestion, sinus pressure, sinus pain and sore throat.   Eyes:  Negative for pain and discharge.  Respiratory:  Negative for cough and shortness of breath.   Cardiovascular:  Positive for leg swelling. Negative for chest pain and palpitations.  Gastrointestinal:  Positive for blood in stool. Negative for constipation, diarrhea, nausea and vomiting.  Endocrine: Negative for polydipsia and polyuria.  Genitourinary:  Negative for dysuria and hematuria.  Musculoskeletal:  Positive for back pain. Negative for neck pain and neck stiffness.  Skin:  Negative for rash.  Neurological:  Negative for dizziness, weakness and numbness.  Psychiatric/Behavioral:  Positive for sleep disturbance. Negative for agitation and behavioral problems. The patient is nervous/anxious.       Objective:    Physical Exam Vitals reviewed.  Constitutional:      General: He is not in acute distress.    Appearance: He is obese. He is not diaphoretic.  HENT:     Head:  Normocephalic and atraumatic.     Nose: Nose normal.     Mouth/Throat:     Mouth: Mucous membranes are moist.  Eyes:     General: No scleral icterus.    Extraocular Movements: Extraocular movements intact.     Pupils: Pupils are equal, round, and reactive to light.  Cardiovascular:     Rate and Rhythm: Normal rate and regular rhythm.     Heart sounds: Murmur (Systolic, most pronounced in right upper sternal border) heard.  Pulmonary:     Breath sounds: Normal breath sounds. No wheezing or rales.  Abdominal:     General: Bowel sounds are normal.     Palpations: Abdomen is soft.     Tenderness: There is no abdominal tenderness. There is no right CVA tenderness, left CVA tenderness, guarding or rebound.  Musculoskeletal:     Cervical back: Neck supple. No rigidity or tenderness.     Right lower leg: Edema (1+) present.     Left lower leg: Edema (1+) present.  Skin:    General: Skin is warm.     Findings: Rash (chronic discoloration of b/l leg, statis dermatitis) present.     Comments: Onychomycosis of toenails and fingernails  Neurological:     General: No focal deficit present.     Mental Status: He is alert and oriented to person, place, and time.     Sensory: No sensory deficit.     Motor: No weakness.  Psychiatric:        Mood and Affect: Mood normal.        Behavior: Behavior normal.    BP 137/70 (BP Location: Left Arm, Patient Position: Standing, Cuff Size: Large)   Pulse 63   Temp 98.3 F (36.8 C)   Resp (!) 22   Ht _0  (1.803 m)   Wt (!) 500 lb (226.8 kg)   SpO2 97%   BMI 69.74 kg/m  Wt Readings from Last 3 Encounters:  11/12/20 (!) 500 lb (226.8 kg)  10/26/20 (!) 487 lb (220.9 kg)  10/12/20 (!) 498 lb 3.2 oz (226 kg)    Health Maintenance Due  Topic Date Due   Pneumococcal Vaccine 63-73 Years old (1 - PCV) Never done   OPHTHALMOLOGY EXAM  Never done   TETANUS/TDAP  Never done   COLONOSCOPY (Pts 45-36yr Insurance coverage will need to be confirmed)   Never done   COVID-19 Vaccine (3 - Booster for [employer]) 05/10/2020    There are no preventive care reminders to display for this patient.   Lab Results  Component Value Date   TSH 4.060 10/04/2020   Lab Results  Component Value Date   WBC 12.3 (H) 10/04/2020   HGB 10.4 (L) 10/04/2020   HCT 33.7 (L) 10/04/2020   MCV 81 10/04/2020   PLT 247 10/04/2020   Lab Results  Component Value Date   NA 139 10/04/2020   K 4.4 10/04/2020   CO2 23 10/04/2020   GLUCOSE 132 (H) 10/04/2020   BUN 35 (H) 10/04/2020   CREATININE  1.30 (H) 10/04/2020   BILITOT 0.6 10/04/2020   ALKPHOS 86 10/04/2020   AST 34 10/04/2020   ALT 21 10/04/2020   PROT 7.2 10/04/2020   ALBUMIN 4.0 10/04/2020   CALCIUM 9.2 10/04/2020   ANIONGAP 12 07/09/2020   EGFR 67 10/04/2020   Lab Results  Component Value Date   CHOL 123 06/10/2020   Lab Results  Component Value Date   HDL 25 (L) 06/10/2020   Lab Results  Component Value Date   LDLCALC 54 06/10/2020   Lab Results  Component Value Date   TRIG 281 (H) 06/10/2020   Lab Results  Component Value Date   CHOLHDL 4.9 06/10/2020   Lab Results  Component Value Date   HGBA1C 5.8 (H) 10/04/2020       Assessment & Plan:   Problem List Items Addressed This Visit       Cardiovascular and Mediastinum   Paroxysmal atrial fibrillation (HCC)    On Metoprolol On Xarelto, need to hold it until further evaluation in ER Follows up with Cardiology         Digestive   Gastrointestinal hemorrhage - Primary    Recurrent BRBPR, intermittent, once every other week or so New onset black-tarry stool, although it could be due to iron supplement, but in the setting of fatigue, weakness and worse dyspnea, will need to get urgent evaluation Last CBC showed drop in Hgb Referred to GI for evaluation/colonoscopy On Protonix         Other   Chronic blood loss anemia    Likely GI blood loss On Xarelto for A Fib On iron supplement Referred to GI for  possible urgent GI evaluation        Referred to ER and discussed with ER Physician.   No orders of the defined types were placed in this encounter.    [name], MD

## 2020-11-12 NOTE — ED Provider Notes (Signed)
[facility] EMERGENCY DEPARTMENT Provider Note  CSN: [id number] Arrival date & time: 11/12/20 1037    History Chief Complaint  Patient presents with   Abdominal Pain     Abdominal Pain  CASMER YEPIZ is a 50 y.o. male with complex PMH including AVR, pAF on Xarelto, OSA, PE, COPD has history of anemia and started iron supplements about a month ago. He has had black liquid stools for about 3 weeks and noticed some BRBPR in the last few days. Has not had constipation or hard stools. Has had some upper abdominal discomfort. No vomiting. He has felt weak and is having some increased SOB.    Past Medical History:  Diagnosis Date   Anxiety    Aortic stenosis    Arthritis    Asthma    Back pain    Cellulitis of skin with lymphangitis    CHF (congestive heart failure) (HCC)    Chronic diastolic congestive heart failure (HCC)    Chronic venous insufficiency    Constipation    COPD (chronic obstructive pulmonary disease) (HCC)    Depression    Dyspnea    Essential hypertension    Gout    Heart murmur    Hypertension    Morbid obesity ([place])    Obesity    Pneumonia    walking pneumonia   Pre-diabetes    Prostatitis    Pulmonary embolism ([place])    Pulmonary hypertension (HCC)    Pyelonephritis    S/P aortic valve replacement with bioprosthetic valve 08/23/2017   25 mm Edwards Inspiris Resilia stented bovine pericardial tissue valve   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   24 mm Hemashield supracoronary straight graft    Sleep apnea    cpap    Thoracic ascending aortic aneurysm (HCC)    Thoracic ascending aortic aneurysm (HCC)    Tobacco abuse     Past Surgical History:  Procedure Laterality Date   AORTIC VALVE REPLACEMENT N/A 08/23/2017   Procedure: AORTIC VALVE REPLACEMENT (AVR) USING INSPIRIS RESILIA AORTIC VALVE SIZE 25 MM;  Surgeon: Rexene Alberts, MD;  Location: [place];  Service: Open Heart Surgery;  Laterality: N/A;   CARDIAC VALVE REPLACEMENT N/A    Phreesia  02/07/2020   MULTIPLE EXTRACTIONS WITH ALVEOLOPLASTY N/A 07/23/2017   Procedure: Extraction of tooth #10 with alveoloplasty and gross debridement of remaining teeth;  Surgeon: Lenn Cal, DDS;  Location: [place];  Service: Oral Surgery;  Laterality: N/A;   NO PAST SURGERIES     PACEMAKER IMPLANT N/A 08/28/2017    St Jude Medical Assurity MRI conditional  dual-chamber pacemaker for symptomatic complete heart blockby Dr Rayann Heman   TEE WITHOUT CARDIOVERSION N/A 07/16/2017   Procedure: TRANSESOPHAGEAL ECHOCARDIOGRAM (TEE);  Surgeon: Lelon Perla, MD;  Location: [facility] ENDOSCOPY;  Service: Cardiovascular;  Laterality: N/A;   TEE WITHOUT CARDIOVERSION N/A 08/23/2017   Procedure: TRANSESOPHAGEAL ECHOCARDIOGRAM (TEE);  Surgeon: Rexene Alberts, MD;  Location: [place];  Service: Open Heart Surgery;  Laterality: N/A;   THORACIC AORTIC ANEURYSM REPAIR N/A 08/23/2017   Procedure: THORACIC ASCENDING ANEURYSM REPAIR (AAA) USING HEMASHIELD GOLD KNITTED MICROVEL DOUBLE VELOUR VASCULAR GRAFT D: 24 MM  L: 30 CM;  Surgeon: Rexene Alberts, MD;  Location: [place];  Service: Open Heart Surgery;  Laterality: N/A;    Family History  Problem Relation Age of Onset   Hypertension Mother    Alzheimer's disease Mother    Heart attack Brother    Diabetes Brother  Social History   Tobacco Use   Smoking status: Former    Packs/day: 2.00    Years: 16.00    Pack years: 32.00    Types: Cigarettes    Quit date: 08/23/2017    Years since quitting: 3.2   Smokeless tobacco: Never  Vaping Use   Vaping Use: Never used  Substance Use Topics   Alcohol use: No    Comment: have had alcohol in the past, not heavy   Drug use: No     Home Medications Prior to Admission medications   Medication Sig Start Date End Date Taking? Authorizing Provider  acetaminophen (TYLENOL) 325 MG tablet Take 650 mg by mouth every 6 (six) hours as needed for mild pain.   Yes [provider]  albuterol (PROVENTIL) (2.5 MG/3ML) 0.083%  nebulizer solution INHALE 1 VIAL VIA NEBULIZER EVERY 6 HOURS AS NEEDED FOR WHEEZING OR SHORTNESS OF BREATH 08/05/20  Yes [name], MD  albuterol (VENTOLIN HFA) 108 (90 Base) MCG/ACT inhaler Inhale 2 puffs into the lungs every 6 (six) hours as needed for wheezing or shortness of breath. 06/10/20  Yes [name], MD  allopurinol (ZYLOPRIM) 300 MG tablet TAKE 1 TABLET EVERY DAY 11/11/20  Yes [name], MD  ammonium lactate (AMLACTIN) 12 % lotion Apply 1 application topically as needed for dry skin. 10/13/20  Yes Felipa Furnace, DPM  atorvastatin (LIPITOR) 40 MG tablet TAKE 1 TABLET EVERY DAY 09/20/20  Yes [name], MD  Blood Glucose Monitoring Suppl (TRUE METRIX METER) w/Device KIT USE AS DIRECTED 08/19/20  Yes [name], MD  busPIRone (BUSPAR) 10 MG tablet TAKE 1 TABLET THREE TIMES DAILY 10/19/20  Yes [name], MD  citalopram (CELEXA) 40 MG tablet TAKE 1 TABLET EVERY DAY 10/19/20  Yes [name], MD  diclofenac Sodium (VOLTAREN) 1 % GEL Apply 2 g topically 4 (four) times daily. 10/26/20  Yes Mordecai Rasmussen, MD  fexofenadine (ALLEGRA) 180 MG tablet Take 180 mg by mouth daily.   Yes [provider]  gabapentin (NEURONTIN) 100 MG capsule Take 1 capsule in the morning and 2 capsules in the evening. 10/01/20  Yes [name], MD  glipiZIDE (GLUCOTROL XL) 5 MG 24 hr tablet Take 1 tablet (5 mg total) by mouth daily with breakfast. 10/08/20  Yes [name], MD  Iron, Ferrous Sulfate, 325 (65 Fe) MG TABS Take 325 mg by mouth daily. 10/08/20  Yes [name], MD  Krill Oil 350 MG CAPS Take 1 capsule by mouth in the morning and at bedtime.    Yes [provider]  lisinopril (ZESTRIL) 20 MG tablet Take 1 tablet (20 mg total) by mouth daily. 04/21/20 11/12/20 Yes Strader, Fransisco Hertz, PA-C  loratadine (CLARITIN) 10 MG tablet Take 10 mg by mouth daily as needed for allergies.   Yes [provider]  metFORMIN (GLUCOPHAGE) 850 MG tablet Take 1 tablet  (850 mg total) by mouth 2 (two) times daily with a meal. 10/08/20  Yes [name], MD  metolazone (ZAROXOLYN) 5 MG tablet PRN Patient taking differently: Take 5 mg by mouth daily as needed (swelling). PRN 06/21/20  Yes Allred, Jeneen Rinks, MD  metoprolol tartrate (LOPRESSOR) 100 MG tablet Take 1 tablet (100 mg total) by mouth 2 (two) times daily. 02/26/20  Yes Strader, [place], PA-C  mupirocin ointment (BACTROBAN) 2 % Apply 1 application topically 2 (two) times daily. 10/01/20  Yes [name], MD  pantoprazole (  PROTONIX) 40 MG tablet Take 1 tablet (40 mg total) by mouth daily. 07/14/20  Yes [name], MD  potassium chloride SA (KLOR-CON) 20 MEQ tablet TAKE 3 TABS DAILY AND TAKE AN ADDITIONAL 1 TAB ON THE DAY YOU TAKE YOUR WEEKLY METOLAZONE DOSE 05/13/20  Yes Strader, [place] M, PA-C  rivaroxaban (XARELTO) 20 MG TABS tablet TAKE 1 Tablet BY MOUTH ONCE EVERY DAY WITH SUPPER 10/20/20  Yes Josue Hector, MD  terbinafine (LAMISIL) 250 MG tablet Take 1 tablet (250 mg total) by mouth daily. 10/01/20  Yes [name], MD  torsemide (DEMADEX) 20 MG tablet Take 2 tablets (40 mg total) by mouth 2 (two) times daily. 04/21/20 11/12/20 Yes Strader, Fransisco Hertz, PA-C  TRUE METRIX BLOOD GLUCOSE TEST test strip TEST ONE TIME DAILY FOR DIABETES 10/25/20  Yes [name], MD  TRUEplus Lancets 33G MISC USE TO TEST ONE TIME DAILY FOR DIABETES 10/25/20  Yes [name], MD  docusate sodium (COLACE) 100 MG capsule Take 200 mg by mouth daily as needed for mild constipation. Patient not taking: No sig reported    [provider]  guaiFENesin (MUCINEX) 600 MG 12 hr tablet Take 600 mg by mouth 2 (two) times daily. Patient not taking: Reported on 11/12/2020    [provider]     Allergies    Patient has no known allergies.   Review of Systems   Review of Systems  Gastrointestinal:  Positive for abdominal pain.  A comprehensive review of systems was completed and negative except as noted  in HPI.    Physical Exam BP (!) 106/57   Pulse 63   Temp 97.9 F (36.6 C)   Resp 16   Ht '5\' 11"'  (1.803 m)   Wt (!) 221.8 kg   SpO2 99%   BMI 68.20 kg/m   Physical Exam Vitals and nursing note reviewed.  Constitutional:      Appearance: Normal appearance. He is obese.  HENT:     Head: Normocephalic and atraumatic.     Nose: Nose normal.     Mouth/Throat:     Mouth: Mucous membranes are moist.  Eyes:     Extraocular Movements: Extraocular movements intact.     Conjunctiva/sclera: Conjunctivae normal.  Cardiovascular:     Rate and Rhythm: Normal rate.  Pulmonary:     Effort: Pulmonary effort is normal.     Breath sounds: Normal breath sounds.  Abdominal:     General: Abdomen is flat.     Palpations: Abdomen is soft.     Tenderness: There is abdominal tenderness (mild) in the epigastric area. There is no guarding. Negative signs include Murphy's sign and McBurney's sign.  Musculoskeletal:        General: No swelling. Normal range of motion.     Cervical back: Neck supple.  Skin:    General: Skin is warm and dry.  Neurological:     General: No focal deficit present.     Mental Status: He is alert.  Psychiatric:        Mood and Affect: Mood normal.     ED Results / Procedures / Treatments   Labs (all labs ordered are listed, but only abnormal results are displayed) Labs Reviewed  COMPREHENSIVE METABOLIC PANEL - Abnormal; Notable for the following components:      Result Value   Glucose, Bld 108 (*)    BUN 23 (*)    Creatinine, Ser 1.26 (*)    All other components  within normal limits  CBC WITH DIFFERENTIAL/PLATELET - Abnormal; Notable for the following components:   WBC 10.6 (*)    RBC 4.02 (*)    Hemoglobin 10.2 (*)    HCT 34.2 (*)    MCH 25.4 (*)    MCHC 29.8 (*)    RDW 18.0 (*)    All other components within normal limits  PROTIME-INR - Abnormal; Notable for the following components:   Prothrombin Time 19.4 (*)    INR 1.6 (*)    All other components  within normal limits  POC OCCULT BLOOD, ED - Abnormal; Notable for the following components:   Fecal Occult Bld POSITIVE (*)    All other components within normal limits  RESP PANEL BY RT-PCR (FLU A&B, COVID) ARPGX2  LIPASE, BLOOD  TYPE AND SCREEN    EKG EKG Interpretation  Date/Time:  Friday November 12 2020 12:08:28 EDT Ventricular Rate:  60 PR Interval:  181 QRS Duration: 162 QT Interval:  472 QTC Calculation: 472 R Axis:   116 Text Interpretation: Sinus rhythm Right bundle branch block No significant change since last tracing Confirmed by Calvert Cantor [id number]) on 11/12/2020 12:13:44 PM  Radiology CT Abdomen Pelvis W Contrast  Result Date: 11/12/2020 CLINICAL DATA:  Abdominal pain with black stools for 3 weeks. EXAM: CT ABDOMEN AND PELVIS WITH CONTRAST TECHNIQUE: Multidetector CT imaging of the abdomen and pelvis was performed using the standard protocol following bolus administration of intravenous contrast. CONTRAST:  16m OMNIPAQUE IOHEXOL 300 MG/ML  SOLN COMPARISON:  03/08/2019. FINDINGS: Lower chest: Clear lung bases. Hepatobiliary: Liver enlarged. No mass or focal lesion. Mildly distended gallbladder. Small densities noted in the dependent gallbladder consistent with stones. No wall thickening. No inflammation. No duct dilation. Pancreas: Unremarkable. No pancreatic ductal dilatation or surrounding inflammatory changes. Spleen: Spleen enlarged, 20 cm in greatest transverse dimension. No splenic mass or focal lesion. 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 No other adrenal masses. Kidneys normal in size, orientation and position with symmetric enhancement and excretion. No mass, stone or hydronephrosis. Normal ureters. Normal bladder. Stomach/Bowel: Normal stomach. Small bowel and colon are normal in caliber. No wall thickening. No inflammation. No evidence of appendicitis. Vascular/Lymphatic: Prominent and mildly enlarged upper abdominal lymph nodes. Peri  celiac node measures 1.9 cm in short axis. Posterior gastrohepatic ligament lymph node measures 1.5 cm in short axis. Nodes are similar to the prior CT. Mild aortic atherosclerosis. No aneurysm. Reproductive: Unremarkable. Other: No abdominal wall hernia. No ascites. Nonspecific subcutaneous soft tissue edema noted along the abdomen and flanks. No fluid collection to suggest an abscess. Musculoskeletal: No fracture or acute finding.  No bone lesion. IMPRESSION: 1. No acute findings within the abdomen or pelvis.  No abscess. 2. Hepato splenomegaly stable compared to the prior CT. 3. Prominent and mildly enlarged upper abdominal lymph nodes, presumed reactive, and essentially stable from the prior CT. 4. Mild aortic atherosclerosis. Electronically Signed   By: DLajean ManesM.D.   On: 11/12/2020 15:02    Procedures Procedures  Medications Ordered in the ED Medications  lactated ringers bolus 1,000 mL (1,000 mLs Intravenous New Bag/Given 11/12/20 1229)  iohexol (OMNIPAQUE) 300 MG/ML solution 100 mL (100 mLs Intravenous Contrast Given 11/12/20 1423)     MDM Rules/Calculators/A&P MDM Patient with multiple medical problems, on iron supplements for anemia, having dark stool as well as some bright red blood. Abdominal pain is vague. Will check labs including CBC, CMP, lipase and hemoccult.   ED Course  I have reviewed  the triage vital signs and the nursing notes.  Pertinent labs & imaging results that were available during my care of the patient were reviewed by me and considered in my medical decision making (see chart for details).  Clinical Course as of 11/12/20 1537  Fri Nov 12, 2020  1206 Hemoccult is positive. CBC with anemia at baseline. WBC is mildly elevated. Will send for CT abd/pel given his pain.  [CS]  3570 Per Tech, stool was dark but not bloody. She also reports BP has been trending down. Will give a liter of LR pending CT.  [CS]  1779 CMP with mild CKD; Lipase is normal. INR mildly  elevated.  [CS]  3903 BP improved.  [CS]  0092 CT without acute process. Will discuss admission with Hospitalist.  [CS]  1523 Spoke with Dr. Waldron Labs, hospitalist who will evaluate the patient for admission. He requests we consult GI to determine plan for potential scope.  [CS]  3300 Dr. Gala Romney will consult.  [CS]    Clinical Course User Index [CS] Truddie Hidden, MD    Final Clinical Impression(s) / ED Diagnoses Final diagnoses:  Gastrointestinal hemorrhage, unspecified gastrointestinal hemorrhage type    Rx / DC Orders ED Discharge Orders     None        Truddie Hidden, MD 11/12/20 1537

## 2020-11-13 DIAGNOSIS — Z20822 Contact with and (suspected) exposure to covid-19: Secondary | ICD-10-CM | POA: Diagnosis not present

## 2020-11-13 DIAGNOSIS — Z7901 Long term (current) use of anticoagulants: Secondary | ICD-10-CM | POA: Diagnosis not present

## 2020-11-13 DIAGNOSIS — E119 Type 2 diabetes mellitus without complications: Secondary | ICD-10-CM | POA: Diagnosis not present

## 2020-11-13 DIAGNOSIS — K760 Fatty (change of) liver, not elsewhere classified: Secondary | ICD-10-CM | POA: Diagnosis present

## 2020-11-13 DIAGNOSIS — K644 Residual hemorrhoidal skin tags: Secondary | ICD-10-CM | POA: Diagnosis present

## 2020-11-13 DIAGNOSIS — K921 Melena: Secondary | ICD-10-CM | POA: Diagnosis not present

## 2020-11-13 DIAGNOSIS — I2510 Atherosclerotic heart disease of native coronary artery without angina pectoris: Secondary | ICD-10-CM | POA: Diagnosis present

## 2020-11-13 DIAGNOSIS — J449 Chronic obstructive pulmonary disease, unspecified: Secondary | ICD-10-CM | POA: Diagnosis not present

## 2020-11-13 DIAGNOSIS — I110 Hypertensive heart disease with heart failure: Secondary | ICD-10-CM | POA: Diagnosis not present

## 2020-11-13 DIAGNOSIS — R195 Other fecal abnormalities: Secondary | ICD-10-CM | POA: Diagnosis not present

## 2020-11-13 DIAGNOSIS — K922 Gastrointestinal hemorrhage, unspecified: Secondary | ICD-10-CM

## 2020-11-13 DIAGNOSIS — G4733 Obstructive sleep apnea (adult) (pediatric): Secondary | ICD-10-CM | POA: Diagnosis present

## 2020-11-13 DIAGNOSIS — K625 Hemorrhage of anus and rectum: Secondary | ICD-10-CM | POA: Diagnosis not present

## 2020-11-13 DIAGNOSIS — F32A Depression, unspecified: Secondary | ICD-10-CM | POA: Diagnosis present

## 2020-11-13 DIAGNOSIS — D509 Iron deficiency anemia, unspecified: Secondary | ICD-10-CM | POA: Diagnosis not present

## 2020-11-13 DIAGNOSIS — I272 Pulmonary hypertension, unspecified: Secondary | ICD-10-CM | POA: Diagnosis not present

## 2020-11-13 DIAGNOSIS — K648 Other hemorrhoids: Secondary | ICD-10-CM | POA: Diagnosis present

## 2020-11-13 DIAGNOSIS — D649 Anemia, unspecified: Secondary | ICD-10-CM | POA: Diagnosis not present

## 2020-11-13 DIAGNOSIS — I5032 Chronic diastolic (congestive) heart failure: Secondary | ICD-10-CM | POA: Diagnosis not present

## 2020-11-13 DIAGNOSIS — Z820 Family history of epilepsy and other diseases of the nervous system: Secondary | ICD-10-CM | POA: Diagnosis not present

## 2020-11-13 DIAGNOSIS — F419 Anxiety disorder, unspecified: Secondary | ICD-10-CM | POA: Diagnosis present

## 2020-11-13 DIAGNOSIS — Z6841 Body Mass Index (BMI) 40.0 and over, adult: Secondary | ICD-10-CM | POA: Diagnosis not present

## 2020-11-13 DIAGNOSIS — I714 Abdominal aortic aneurysm, without rupture: Secondary | ICD-10-CM | POA: Diagnosis not present

## 2020-11-13 DIAGNOSIS — Z8249 Family history of ischemic heart disease and other diseases of the circulatory system: Secondary | ICD-10-CM | POA: Diagnosis not present

## 2020-11-13 DIAGNOSIS — Z953 Presence of xenogenic heart valve: Secondary | ICD-10-CM | POA: Diagnosis not present

## 2020-11-13 DIAGNOSIS — I480 Paroxysmal atrial fibrillation: Secondary | ICD-10-CM | POA: Diagnosis present

## 2020-11-13 DIAGNOSIS — E785 Hyperlipidemia, unspecified: Secondary | ICD-10-CM | POA: Diagnosis not present

## 2020-11-13 LAB — COMPREHENSIVE METABOLIC PANEL
ALT: 21 U/L (ref 0–44)
AST: 29 U/L (ref 15–41)
Albumin: 3.3 g/dL — ABNORMAL LOW (ref 3.5–5.0)
Alkaline Phosphatase: 69 U/L (ref 38–126)
Anion gap: 7 (ref 5–15)
BUN: 19 mg/dL (ref 6–20)
CO2: 27 mmol/L (ref 22–32)
Calcium: 9 mg/dL (ref 8.9–10.3)
Chloride: 104 mmol/L (ref 98–111)
Creatinine, Ser: 1.33 mg/dL — ABNORMAL HIGH (ref 0.61–1.24)
GFR, Estimated: >60 mL/min (ref >60)
Glucose, Bld: 95 mg/dL (ref 70–99)
Potassium: 3.7 mmol/L (ref 3.5–5.1)
Sodium: 138 mmol/L (ref 135–145)
Total Bilirubin: 1.1 mg/dL (ref 0.3–1.2)
Total Protein: 7.2 g/dL (ref 6.5–8.1)

## 2020-11-13 LAB — IRON AND TIBC
Iron: 46 ug/dL (ref 45–182)
Saturation Ratios: 11 % — ABNORMAL LOW (ref 17.9–39.5)
TIBC: 437 ug/dL (ref 250–450)
UIBC: 391 ug/dL

## 2020-11-13 LAB — GLUCOSE, CAPILLARY
Glucose-Capillary: 112 mg/dL — ABNORMAL HIGH (ref 70–99)
Glucose-Capillary: 121 mg/dL — ABNORMAL HIGH (ref 70–99)
Glucose-Capillary: 103 mg/dL — ABNORMAL HIGH (ref 70–99)
Glucose-Capillary: 95 mg/dL (ref 70–99)

## 2020-11-13 LAB — CBC
HCT: 33.7 % — ABNORMAL LOW (ref 39.0–52.0)
Hemoglobin: 10.2 g/dL — ABNORMAL LOW (ref 13.0–17.0)
MCH: 24.7 pg — ABNORMAL LOW (ref 26.0–34.0)
MCHC: 30.3 g/dL (ref 30.0–36.0)
MCV: 81.6 fL (ref 80.0–100.0)
Platelets: 245 10*3/uL (ref 150–400)
RBC: 4.13 MIL/uL — ABNORMAL LOW (ref 4.22–5.81)
RDW: 18 % — ABNORMAL HIGH (ref 11.5–15.5)
WBC: 10.6 10*3/uL — ABNORMAL HIGH (ref 4.0–10.5)
nRBC: 0 % (ref 0.0–0.2)

## 2020-11-13 LAB — FERRITIN: Ferritin: 88 ng/mL (ref 24–336)

## 2020-11-13 MED ORDER — PEG-KCL-NACL-NASULF-NA ASC-C 100 G PO SOLR: 1.0000 | Freq: Once | ORAL | Status: DC

## 2020-11-13 MED ORDER — PEG-KCL-NACL-NASULF-NA ASC-C 100 G PO SOLR
0.5000 | Freq: Once | ORAL | Status: AC
  Administered 2020-11-13: 100 g via ORAL
  Filled 2020-11-13: qty 1

## 2020-11-13 MED ORDER — PEG-KCL-NACL-NASULF-NA ASC-C 100 G PO SOLR
0.5000 | Freq: Once | ORAL | Status: AC
  Administered 2020-11-13: 100 g via ORAL

## 2020-11-13 NOTE — Consult Note (Addendum)
Consultation  Referring Provider:     Phillips Climes MD Primary Care Physician:  [name], MD Primary Gastroenterologist:        NA Reason for Consultation:     anemia, heme positive stool         HPI:   Reginald [name] is a 50 y.o. male with multiple medical problems to include COPD, history of diastolic CHF, history of pulmonary embolism and AF on Xarelto, AS s/p AVR, heart block with pacemaker in place, AAA, pulmonary hypertension, morbid obesity with BMI 68, consulted by hospitalist service for anemia and heme positive stool.  He was sent to the emergency room yesterday at [facility] for dark heme positive stool and worsening anemia.  Patient's baseline hemoglobin is normal on 13s, back in February was noted to be 12.3, in May was 10.4.  Yesterday was noted to be 10.2.  MCV of 81-82, no iron studies sent.  He reports worsening fatigue lately.  Sounds like he was placed on iron about a month ago, since that time he has had multiple dark black stools per day.  He states he has had rectal bleeding however for several months and irregular bowel habits.  He will have multiple BMs today, some normal, some with narrow caliber, has seen bright red blood in the stool and in the toilet bowl periodically.  He also has been having some upper abdominal pain in recent weeks as well as intermittent lower abdominal pain as well.  He had a CT scan done in the emergency room yesterday showing no acute pathology.  He does have stable hepatosplenomegaly on CT imaging and has had steatosis noted on prior ultrasound and CTs in the past.  He also has some prominent and mildly enlarged upper abdominal lymph nodes, presumed reactive per radiology.  He denies any weight loss, body mass index is 68.  He states he previously was taking NSAIDs for joint pains although has been off that for at least the past month or so and generally tries to avoid using NSAIDs.  He does take Xarelto, his last dose of that  was on Thursday night.  He has been hemodynamically stable since has been in our hospital and no significant bleeding symptoms.  He had stool sent for occult blood which was positive.  He is hungry and wants to eat, frustrated by alarms going off in his room and he did not sleep well.  He denies any prior endoscopy or colonoscopy.  Denies any family history of esophageal, gastric, or colon cancer, although does not know his father's side of the family.  He does take Protonix 40 mg once daily.  He denies really any heartburn symptoms that bother him on this regimen, but states he does have a history of reflux.  He denies any dysphagia.  His abdominal discomfort does seem mostly postprandial in his upper abdomen.  He has been noted to have gallstones on prior imaging.  Normal platelets he denies any history of cirrhosis that is known.  Does not have a significant alcohol history.  Otherwise states he feels okay today, wants to get out of the hospital as soon as he can but understands he warrants work-up for this.  Denies any chest pain or shortness of breath at this time     Past Medical History:  Diagnosis Date   Anxiety    Aortic stenosis    Arthritis    Asthma    Back pain  Cellulitis of skin with lymphangitis    CHF (congestive heart failure) (HCC)    Chronic diastolic congestive heart failure (HCC)    Chronic venous insufficiency    Constipation    COPD (chronic obstructive pulmonary disease) (HCC)    Depression    Diabetes (HCC)    Dyspnea    Essential hypertension    Gout    Heart murmur    Hypertension    Morbid obesity ([place])    Obesity    Pneumonia    walking pneumonia   Prostatitis    Pulmonary embolism ([place])    Pulmonary hypertension (HCC)    Pyelonephritis    S/P aortic valve replacement with bioprosthetic valve 08/23/2017   25 mm Edwards Inspiris Resilia stented bovine pericardial tissue valve   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   24 mm Hemashield  supracoronary straight graft    Sleep apnea    cpap    Thoracic ascending aortic aneurysm [facility])    Thoracic ascending aortic aneurysm (HCC)    Tobacco abuse     Past Surgical History:  Procedure Laterality Date   AORTIC VALVE REPLACEMENT N/A 08/23/2017   Procedure: AORTIC VALVE REPLACEMENT (AVR) USING INSPIRIS RESILIA AORTIC VALVE SIZE 25 MM;  Surgeon: Rexene Alberts, MD;  Location: [place];  Service: Open Heart Surgery;  Laterality: N/A;   CARDIAC VALVE REPLACEMENT N/A    Phreesia 02/07/2020   MULTIPLE EXTRACTIONS WITH ALVEOLOPLASTY N/A 07/23/2017   Procedure: Extraction of tooth #10 with alveoloplasty and gross debridement of remaining teeth;  Surgeon: Lenn Cal, DDS;  Location: [place];  Service: Oral Surgery;  Laterality: N/A;   PACEMAKER IMPLANT N/A 08/28/2017    St Jude Medical Assurity MRI conditional  dual-chamber pacemaker for symptomatic complete heart blockby Dr Rayann Heman   TEE WITHOUT CARDIOVERSION N/A 07/16/2017   Procedure: TRANSESOPHAGEAL ECHOCARDIOGRAM (TEE);  Surgeon: Lelon Perla, MD;  Location: [facility];  Service: Cardiovascular;  Laterality: N/A;   TEE WITHOUT CARDIOVERSION N/A 08/23/2017   Procedure: TRANSESOPHAGEAL ECHOCARDIOGRAM (TEE);  Surgeon: Rexene Alberts, MD;  Location: [place];  Service: Open Heart Surgery;  Laterality: N/A;   THORACIC AORTIC ANEURYSM REPAIR N/A 08/23/2017   Procedure: THORACIC ASCENDING ANEURYSM REPAIR (AAA) USING HEMASHIELD GOLD KNITTED MICROVEL DOUBLE VELOUR VASCULAR GRAFT D: 24 MM  L: 30 CM;  Surgeon: Rexene Alberts, MD;  Location: [place];  Service: Open Heart Surgery;  Laterality: N/A;    Family History  Problem Relation Age of Onset   Hypertension Mother    Alzheimer's disease Mother    Heart attack Mother    Heart attack Brother    Diabetes Brother      Social History   Tobacco Use   Smoking status: Former    Packs/day: 2.00    Years: 16.00    Pack years: 32.00    Types: Cigarettes    Quit date: 08/23/2017     Years since quitting: 3.2   Smokeless tobacco: Never  Vaping Use   Vaping Use: Never used  Substance Use Topics   Alcohol use: No    Comment: have had alcohol in the past, not heavy   Drug use: No    Prior to Admission medications   Medication Sig Start Date End Date Taking? Authorizing Provider  acetaminophen (TYLENOL) 325 MG tablet Take 650 mg by mouth every 6 (six) hours as needed for mild pain.   Yes [provider]  albuterol (PROVENTIL) (2.5 MG/3ML) 0.083% nebulizer solution INHALE 1 VIAL  VIA NEBULIZER EVERY 6 HOURS AS NEEDED FOR WHEEZING OR SHORTNESS OF BREATH 08/05/20  Yes [name], MD  albuterol (VENTOLIN HFA) 108 (90 Base) MCG/ACT inhaler Inhale 2 puffs into the lungs every 6 (six) hours as needed for wheezing or shortness of breath. 06/10/20  Yes [name], MD  allopurinol (ZYLOPRIM) 300 MG tablet TAKE 1 TABLET EVERY DAY 11/11/20  Yes [name], MD  ammonium lactate (AMLACTIN) 12 % lotion Apply 1 application topically as needed for dry skin. 10/13/20  Yes Felipa Furnace, DPM  atorvastatin (LIPITOR) 40 MG tablet TAKE 1 TABLET EVERY DAY 09/20/20  Yes [name], MD  Blood Glucose Monitoring Suppl (TRUE METRIX METER) w/Device KIT USE AS DIRECTED 08/19/20  Yes [name], MD  busPIRone (BUSPAR) 10 MG tablet TAKE 1 TABLET THREE TIMES DAILY 10/19/20  Yes [name], MD  citalopram (CELEXA) 40 MG tablet TAKE 1 TABLET EVERY DAY 10/19/20  Yes [name], MD  diclofenac Sodium (VOLTAREN) 1 % GEL Apply 2 g topically 4 (four) times daily. 10/26/20  Yes Mordecai Rasmussen, MD  fexofenadine (ALLEGRA) 180 MG tablet Take 180 mg by mouth daily.   Yes [provider]  gabapentin (NEURONTIN) 100 MG capsule Take 1 capsule in the morning and 2 capsules in the evening. 10/01/20  Yes [name], MD  glipiZIDE (GLUCOTROL XL) 5 MG 24 hr tablet Take 1 tablet (5 mg total) by mouth daily with breakfast. 10/08/20  Yes [name], MD  Iron, Ferrous Sulfate,  325 (65 Fe) MG TABS Take 325 mg by mouth daily. 10/08/20  Yes [name], MD  Krill Oil 350 MG CAPS Take 1 capsule by mouth in the morning and at bedtime.    Yes [provider]  lisinopril (ZESTRIL) 20 MG tablet Take 1 tablet (20 mg total) by mouth daily. 04/21/20 11/12/20 Yes Strader, Fransisco Hertz, PA-C  loratadine (CLARITIN) 10 MG tablet Take 10 mg by mouth daily as needed for allergies.   Yes [provider]  metFORMIN (GLUCOPHAGE) 850 MG tablet Take 1 tablet (850 mg total) by mouth 2 (two) times daily with a meal. 10/08/20  Yes [name], MD  metolazone (ZAROXOLYN) 5 MG tablet PRN Patient taking differently: Take 5 mg by mouth daily as needed (swelling). PRN 06/21/20  Yes Allred, Jeneen Rinks, MD  metoprolol tartrate (LOPRESSOR) 100 MG tablet Take 1 tablet (100 mg total) by mouth 2 (two) times daily. 02/26/20  Yes Strader, [place], PA-C  mupirocin ointment (BACTROBAN) 2 % Apply 1 application topically 2 (two) times daily. 10/01/20  Yes [name], MD  pantoprazole (PROTONIX) 40 MG tablet Take 1 tablet (40 mg total) by mouth daily. 07/14/20  Yes [name], MD  potassium chloride SA (KLOR-CON) 20 MEQ tablet TAKE 3 TABS DAILY AND TAKE AN ADDITIONAL 1 TAB ON THE DAY YOU TAKE YOUR WEEKLY METOLAZONE DOSE 05/13/20  Yes Strader, [place] M, PA-C  rivaroxaban (XARELTO) 20 MG TABS tablet TAKE 1 Tablet BY MOUTH ONCE EVERY DAY WITH SUPPER 10/20/20  Yes Josue Hector, MD  terbinafine (LAMISIL) 250 MG tablet Take 1 tablet (250 mg total) by mouth daily. 10/01/20  Yes [name], MD  torsemide (DEMADEX) 20 MG tablet Take 2 tablets (40 mg total) by mouth 2 (two) times daily. 04/21/20 11/12/20 Yes Strader, Fransisco Hertz, PA-C  TRUE METRIX BLOOD GLUCOSE TEST test strip TEST ONE TIME DAILY FOR DIABETES 10/25/20  Yes Posey Pronto, Gregor Hams  K, MD  TRUEplus Lancets 33G MISC USE TO TEST ONE TIME DAILY FOR DIABETES 10/25/20  Yes [name], MD  docusate sodium (COLACE) 100 MG capsule Take 200 mg by  mouth daily as needed for mild constipation. Patient not taking: No sig reported    [provider]  guaiFENesin (MUCINEX) 600 MG 12 hr tablet Take 600 mg by mouth 2 (two) times daily. Patient not taking: Reported on 11/12/2020    [provider]    Current Facility-Administered Medications  Medication Dose Route Frequency Provider Last Rate Last Admin   acetaminophen (TYLENOL) tablet 650 mg  650 mg Oral Q6H PRN Elgergawy, Silver Huguenin, MD   650 mg at 11/13/20 0539   allopurinol (ZYLOPRIM) tablet 300 mg  300 mg Oral Daily Elgergawy, Silver Huguenin, MD       atorvastatin (LIPITOR) tablet 40 mg  40 mg Oral Daily Elgergawy, Silver Huguenin, MD   40 mg at 11/12/20 2148   busPIRone (BUSPAR) tablet 10 mg  10 mg Oral TID Elgergawy, Silver Huguenin, MD   10 mg at 11/12/20 2146   citalopram (CELEXA) tablet 40 mg  40 mg Oral Daily Elgergawy, Silver Huguenin, MD       insulin aspart (novoLOG) injection 0-15 Units  0-15 Units Subcutaneous TID WC Elgergawy, Silver Huguenin, MD       insulin aspart (novoLOG) injection 0-5 Units  0-5 Units Subcutaneous QHS Elgergawy, Silver Huguenin, MD       metoprolol tartrate (LOPRESSOR) tablet 100 mg  100 mg Oral BID Elgergawy, Silver Huguenin, MD   100 mg at 11/12/20 2145   pantoprazole (PROTONIX) injection 40 mg  40 mg Intravenous Q12H Elgergawy, Silver Huguenin, MD   40 mg at 11/12/20 2146   torsemide (DEMADEX) tablet 40 mg  40 mg Oral Daily Elgergawy, Silver Huguenin, MD        Allergies as of 11/12/2020   (No Known Allergies)     Review of Systems:    As per HPI, otherwise negative    Physical Exam:  Vital signs in last 24 hours: Temp:  [97.7 F (36.5 C)-99.2 F (37.3 C)] 97.7 F (36.5 C) (06/25 0531) Pulse Rate:  [58-66] 60 (06/25 0531) Resp:  [11-23] 16 (06/25 0531) BP: (98-130)/(36-93) 130/74 (06/25 0531) SpO2:  [94 %-100 %] 94 % (06/25 0531) Weight:  [221.8 kg] 221.8 kg (06/24 1307) Last BM Date: 11/12/20 General:   Pleasant male in NAD Head:  Normocephalic and atraumatic. Eyes:   No  icterus.   Conjunctiva pink. Ears:  Normal auditory acuity. Neck:  Supple Lungs: Respirations even and unlabored. CTA B Heart:  Regular rate and rhythm; n Abdomen:  Soft, protuberant abdomen with large pannus, nontender.  Rectal:  Not performed.  Msk:  Symmetrical without gross deformities.  Extremities:  Without edema. Neurologic:  Alert and  oriented x4;  grossly normal neurologically. Skin:  Intact without significant lesions or rashes. Psych:  Alert and cooperative. Normal affect.  LAB RESULTS: Recent Labs    11/12/20 1132 11/12/20 2239  WBC 10.6* 11.9*  HGB 10.2* 10.1*  HCT 34.2* 33.5*  PLT 257 278   BMET Recent Labs    11/12/20 1132 11/13/20 0128  NA 138 138  K 3.9 3.7  CL 102 104  CO2 29 27  GLUCOSE 108* 95  BUN 23* 19  CREATININE 1.26* 1.33*  CALCIUM 9.0 9.0   LFT Recent Labs    11/13/20 0128  PROT 7.2  ALBUMIN 3.3*  AST 29  ALT  21  ALKPHOS 69  BILITOT 1.1   PT/INR Recent Labs    11/12/20 1132  LABPROT 19.4*  INR 1.6*    STUDIES: CT Abdomen Pelvis W Contrast  Result Date: 11/12/2020 CLINICAL DATA:  Abdominal pain with black stools for 3 weeks. EXAM: CT ABDOMEN AND PELVIS WITH CONTRAST TECHNIQUE: Multidetector CT imaging of the abdomen and pelvis was performed using the standard protocol following bolus administration of intravenous contrast. CONTRAST:  114m OMNIPAQUE IOHEXOL 300 MG/ML  SOLN COMPARISON:  03/08/2019. FINDINGS: Lower chest: Clear lung bases. Hepatobiliary: Liver enlarged. No mass or focal lesion. Mildly distended gallbladder. Small densities noted in the dependent gallbladder consistent with stones. No wall thickening. No inflammation. No duct dilation. Pancreas: Unremarkable. No pancreatic ductal dilatation or surrounding inflammatory changes. Spleen: Spleen enlarged, 20 cm in greatest transverse dimension. No splenic mass or focal lesion. 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 No other adrenal masses.  Kidneys normal in size, orientation and position with symmetric enhancement and excretion. No mass, stone or hydronephrosis. Normal ureters. Normal bladder. Stomach/Bowel: Normal stomach. Small bowel and colon are normal in caliber. No wall thickening. No inflammation. No evidence of appendicitis. Vascular/Lymphatic: Prominent and mildly enlarged upper abdominal lymph nodes. Peri celiac node measures 1.9 cm in short axis. Posterior gastrohepatic ligament lymph node measures 1.5 cm in short axis. Nodes are similar to the prior CT. Mild aortic atherosclerosis. No aneurysm. Reproductive: Unremarkable. Other: No abdominal wall hernia. No ascites. Nonspecific subcutaneous soft tissue edema noted along the abdomen and flanks. No fluid collection to suggest an abscess. Musculoskeletal: No fracture or acute finding.  No bone lesion. IMPRESSION: 1. No acute findings within the abdomen or pelvis.  No abscess. 2. Hepato splenomegaly stable compared to the prior CT. 3. Prominent and mildly enlarged upper abdominal lymph nodes, presumed reactive, and essentially stable from the prior CT. 4. Mild aortic atherosclerosis. Electronically Signed   By: DLajean ManesM.D.   On: 11/12/2020 15:02     PREVIOUS ENDOSCOPIES:            None    Impression / Plan:   50year old male with multiple medical problems as outlined above, admitted to the hospital with the following issues:  Anemia Heme positive stool Rectal bleeding  Has had a progressive anemia over a few months.  While he has dark stools for the past 4 weeks this correlates really well with his iron use and I suspect related, seems unlikely to be active upper GI bleed.  However over several months he has had intermittent rectal bleeding and irregular bowel habits.  He is on Xarelto.  He needs iron studies checked to confirm suspected iron deficiency.  We discussed differential diagnosis for his anemia and symptoms, and what work-up would entail.  I am recommending  an upper endoscopy and colonoscopy to evaluate this initially. While I don't think he is having any significant active bleeding, given his significant comorbidities and his weight with BMI of 68, he is higher than average risk for anesthesia and case will need to be done in the hospital for anesthesia support.  We discussed EGD and colonoscopy, anesthesia, and risks of this, and after this discussion he wishes to proceed.  His last dose of Xarelto was Thursday night, I think okay to proceed with these exams on Sunday as this should be flushed out of his system by then.  We will keep on clear liquid diet today, bowel prep tonight, with plans for procedure tomorrow.  We will  continue to hold his Xarelto.  We will check iron studies today.  Of note he has severe hepatic steatosis on CT imaging with hepatosplenomegaly, at risk for cirrhosis.  LFTs look okay, platelets normal, but clearly warrants weight loss moving forward for multiple reasons.  This issue can be addressed as an outpatient.  Plan: - clear liquid diet today, NPO after MN - hold Xarelto  - bowel prep tonight - EGD and colonoscopy tomorrow pending anesthesia availability, with Dr. Carlean Purl.  - labs for iron studies today - continue empiric IV protonix for now  Will follow, call with questions.  Jolly Mango, MD [facility]

## 2020-11-13 NOTE — Progress Notes (Signed)
PROGRESS NOTE                                                                                                                                                                                                             Patient Demographics:    Reginald [name], is a 50 y.o. male, DOB - 02-Feb-1971, JQD:[id number]  Outpatient Primary MD for the patient is Reginald Spar, MD    LOS - 0  Admit date - 11/12/2020    Chief Complaint  Patient presents with   Abdominal Pain       Brief Narrative (HPI from H&P)    - Reginald [name]  is a 50 y.o. male,  Hypertension, Pre-diabetes, CHF (diastolic), Aortic stenosis s/p AVR, AAA, Copd, pulmonary hypertension, OSA, A. fib on anticoagulation, PE , COPD, patient with history of anemia, where he was started on oral iron supplements 3 to 4 weeks ago, patient reports black, liquid stools for last 3 weeks, as well he does report intermittent bright red blood per rectum for the last couple weeks, he presented to [facility] and from there he was sent to [facility] for further evaluation.  H&H is stable.   Subjective:    Reginald [name] today has, No headache, No chest pain, No abdominal pain - No Nausea, No new weakness tingling or numbness, no SOB   Assessment  & Plan :    Active Problems:   Essential hypertension   Chronic heart failure with preserved ejection fraction (HFpEF) (HCC)   COPD (chronic obstructive pulmonary disease) (HCC)   Anxiety   DM2 (diabetes mellitus, type 2) (HCC)   GI bleed   Anemia   Rectal bleeding   Heme positive stool   Anticoagulated   Black stools and some bright red blood per rectum with lower GI bleed - his H&H is essentially unremarkable, I think his black stools are due to oral iron supplementation and bright red blood is consistent with hemorrhoidal bleed, hold Eliquis, PPI, monitoring H&H GI to see and planning to do EGD colonoscopy on 11/14/2020.  Patient is  agreeable for transfusion if needed.  Will monitor closely.  2.  Morbid obesity with OSA and fatty liver.  Follow with PCP for weight loss CPAP nightly.  3.  Chronic diastolic CHF EF 03% on last echocardiogram.  On beta-blocker, low-dose torsemide  to continue, monitor.  4.  CAD s/p CABG.  Continue beta-blocker, statin can [place] prevention.  Eliquis on hold due to #1 above.  5.  Paroxysmal A. fib.  Mali vas 2 score of greater than 3.  On beta-blocker.  Eliquis on hold as above.  6.  History of complete heart block.  Has pacemaker.  7.  History of gout.  Stable on allopurinol.    8. DM type II.  For now on sliding scale monitor and adjust.  Lab Results  Component Value Date   HGBA1C 5.4 11/12/2020   CBG (last 3)  Recent Labs    11/12/20 2014 11/13/20 0839  GLUCAP 92 112*    Obesity: Follow with PCP for weight loss. Estimated body mass index is 68.2 kg/m as calculated from the following:   Height as of this encounter: '5\' 11"'  (1.803 m).   Weight as of this encounter: 221.8 kg.          Condition - Fair  Family Communication  :  None bedside  Code Status :  Full  Consults  :  GI  PUD Prophylaxis : PPI   Procedures  :     EGD  Colonoscopy.  CT scan abdomen pelvis.  Fatty liver.  Reactive but stable abdominal lymphadenopathy.      Disposition Plan  :    Status is: Observation    Dispo: The patient is from: Home              Anticipated d/c is to: Home              Patient currently is not medically stable to d/c.   Difficult to place patient No  DVT Prophylaxis  :    SCDs Start: 11/12/20 2110  Lab Results  Component Value Date   PLT 278 11/12/2020    Diet :  Diet Order             Diet clear liquid Room service appropriate? Yes; Fluid consistency: Thin  Diet effective now                    Inpatient Medications  Scheduled Meds:  allopurinol  300 mg Oral Daily   atorvastatin  40 mg Oral Daily   busPIRone  10 mg Oral TID    citalopram  40 mg Oral Daily   insulin aspart  0-15 Units Subcutaneous TID WC   insulin aspart  0-5 Units Subcutaneous QHS   metoprolol tartrate  100 mg Oral BID   pantoprazole (PROTONIX) IV  40 mg Intravenous Q12H   peg 3350 powder  1 kit Oral Once   torsemide  40 mg Oral Daily   Continuous Infusions: PRN Meds:.acetaminophen  Antibiotics  :    Anti-infectives (From admission, onward)    None        Time Spent in minutes  30   Lala Lund M.D on 11/13/2020 at 9:52 AM  To page go to [email or web address]   [facility] -  Office  [phone]  See all Orders from today for further details    Objective:   Vitals:   11/13/20 0028 11/13/20 0126 11/13/20 0531 11/13/20 0838  BP:  (!) 98/49 130/74 113/64  Pulse: 66 60 60 60  Resp: (!) '21 18 16 17  ' Temp:  98.6 F (37 C) 97.7 F (36.5 C) 98.4 F (36.9 C)  TempSrc:  Oral Oral Oral  SpO2: 96% 97% 94% 93%  Weight:  Height:        Wt Readings from Last 3 Encounters:  11/12/20 (!) 221.8 kg  11/12/20 (!) 226.8 kg  10/26/20 (!) 220.9 kg    No intake or output data in the 24 hours ending 11/13/20 7793   Physical Exam  Awake Alert, No new F.N deficits, Normal affect [place].AT,PERRAL Supple Neck,No JVD, No cervical lymphadenopathy appriciated.  Symmetrical Chest wall movement, Good air movement bilaterally, CTAB RRR,No Gallops,Rubs or new Murmurs, No Parasternal Heave +ve B.Sounds, Abd Soft, No tenderness, No organomegaly appriciated, No rebound - guarding or rigidity. No Cyanosis, Clubbing or edema, No new Rash or bruise        Data Review:    CBC Recent Labs  Lab 11/12/20 1132 11/12/20 2239  WBC 10.6* 11.9*  HGB 10.2* 10.1*  HCT 34.2* 33.5*  PLT 257 278  MCV 85.1 82.1  MCH 25.4* 24.8*  MCHC 29.8* 30.1  RDW 18.0* 17.9*  LYMPHSABS 2.0  --   MONOABS 0.6  --   EOSABS 0.3  --   BASOSABS 0.0  --     Recent Labs  Lab 11/12/20 1132 11/13/20 0128  NA 138 138  K 3.9 3.7  CL 102 104  CO2 29 27   GLUCOSE 108* 95  BUN 23* 19  CREATININE 1.26* 1.33*  CALCIUM 9.0 9.0  AST 29 29  ALT 24 21  ALKPHOS 75 69  BILITOT 1.0 1.1  ALBUMIN 3.6 3.3*  INR 1.6*  --   HGBA1C 5.4  --     ------------------------------------------------------------------------------------------------------------------ No results for input(s): CHOL, HDL, LDLCALC, TRIG, CHOLHDL, LDLDIRECT in the last 72 hours.  Lab Results  Component Value Date   HGBA1C 5.4 11/12/2020   ------------------------------------------------------------------------------------------------------------------ No results for input(s): TSH, T4TOTAL, T3FREE, THYROIDAB in the last 72 hours.  Invalid input(s): FREET3  Cardiac Enzymes No results for input(s): CKMB, TROPONINI, MYOGLOBIN in the last 168 hours.  Invalid input(s): CK ------------------------------------------------------------------------------------------------------------------    Component Value Date/Time   BNP 70.0 07/09/2020 2131    Micro Results Recent Results (from the past 240 hour(s))  Resp Panel by RT-PCR (Flu A&B, Covid) Nasopharyngeal Swab     Status: None   Collection Time: 11/12/20  4:25 PM   Specimen: Nasopharyngeal Swab; Nasopharyngeal(NP) swabs in vial transport medium  Result Value Ref Range Status   SARS Coronavirus 2 by RT PCR NEGATIVE NEGATIVE Final    Comment: (NOTE) SARS-CoV-2 target nucleic acids are NOT DETECTED.  The SARS-CoV-2 RNA is generally detectable in upper respiratory specimens during the acute phase of infection. The lowest concentration of SARS-CoV-2 viral copies this assay can detect is 138 copies/mL. A negative result does not preclude SARS-Cov-2 infection and should not be used as the sole basis for treatment or other patient management decisions. A negative result may occur with  improper specimen collection/handling, submission of specimen other than nasopharyngeal swab, presence of viral mutation(s) within the areas  targeted by this assay, and inadequate number of viral copies(<138 copies/mL). A negative result must be combined with clinical observations, patient history, and epidemiological information. The expected result is Negative.  Fact Sheet for Patients:  EntrepreneurPulse.com.au  Fact Sheet for Healthcare Providers:  IncredibleEmployment.be  This test is no t yet approved or cleared by the [place] FDA and  has been authorized for detection and/or diagnosis of SARS-CoV-2 by FDA under an Emergency Use Authorization (EUA). This EUA will remain  in effect (meaning this test can be used) for the duration of the COVID-19 declaration under  Section 564(b)(1) of the Act, 21 U.S.C.section 360bbb-3(b)(1), unless the authorization is terminated  or revoked sooner.       Influenza A by PCR NEGATIVE NEGATIVE Final   Influenza B by PCR NEGATIVE NEGATIVE Final    Comment: (NOTE) The Xpert Xpress SARS-CoV-2/FLU/RSV plus assay is intended as an aid in the diagnosis of influenza from Nasopharyngeal swab specimens and should not be used as a sole basis for treatment. Nasal washings and aspirates are unacceptable for Xpert Xpress SARS-CoV-2/FLU/RSV testing.  Fact Sheet for Patients: EntrepreneurPulse.com.au  Fact Sheet for Healthcare Providers: IncredibleEmployment.be  This test is not yet approved or cleared by the [place] FDA and has been authorized for detection and/or diagnosis of SARS-CoV-2 by FDA under an Emergency Use Authorization (EUA). This EUA will remain in effect (meaning this test can be used) for the duration of the COVID-19 declaration under Section 564(b)(1) of the Act, 21 U.S.C. section 360bbb-3(b)(1), unless the authorization is terminated or revoked.  Performed at [facility], [address] [place], [place] [address]     Radiology Reports DG Shoulder Right  Result Date:  10/31/2020 Formatting of this result is different from the original. X-rays of the right shoulder were obtained in clinic today and demonstrates no acute injury.  No significant glenohumeral arthritis.  Mild degenerative changes noted at the [facility] joint, including some narrowing, as well as superior osteophytes.   Impression: Mild right AC joint arthritis  CT Abdomen Pelvis W Contrast  Result Date: 11/12/2020 CLINICAL DATA:  Abdominal pain with black stools for 3 weeks. EXAM: CT ABDOMEN AND PELVIS WITH CONTRAST TECHNIQUE: Multidetector CT imaging of the abdomen and pelvis was performed using the standard protocol following bolus administration of intravenous contrast. CONTRAST:  129m OMNIPAQUE IOHEXOL 300 MG/ML  SOLN COMPARISON:  03/08/2019. FINDINGS: Lower chest: Clear lung bases. Hepatobiliary: Liver enlarged. No mass or focal lesion. Mildly distended gallbladder. Small densities noted in the dependent gallbladder consistent with stones. No wall thickening. No inflammation. No duct dilation. Pancreas: Unremarkable. No pancreatic ductal dilatation or surrounding inflammatory changes. Spleen: Spleen enlarged, 20 cm in greatest transverse dimension. No splenic mass or focal lesion. 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 No other adrenal masses. Kidneys normal in size, orientation and position with symmetric enhancement and excretion. No mass, stone or hydronephrosis. Normal ureters. Normal bladder. Stomach/Bowel: Normal stomach. Small bowel and colon are normal in caliber. No wall thickening. No inflammation. No evidence of appendicitis. Vascular/Lymphatic: Prominent and mildly enlarged upper abdominal lymph nodes. Peri celiac node measures 1.9 cm in short axis. Posterior gastrohepatic ligament lymph node measures 1.5 cm in short axis. Nodes are similar to the prior CT. Mild aortic atherosclerosis. No aneurysm. Reproductive: Unremarkable. Other: No abdominal wall hernia. No ascites.  Nonspecific subcutaneous soft tissue edema noted along the abdomen and flanks. No fluid collection to suggest an abscess. Musculoskeletal: No fracture or acute finding.  No bone lesion. IMPRESSION: 1. No acute findings within the abdomen or pelvis.  No abscess. 2. Hepato splenomegaly stable compared to the prior CT. 3. Prominent and mildly enlarged upper abdominal lymph nodes, presumed reactive, and essentially stable from the prior CT. 4. Mild aortic atherosclerosis. Electronically Signed   By: DLajean ManesM.D.   On: 11/12/2020 15:02

## 2020-11-13 NOTE — Progress Notes (Signed)
CNA reported to me that the patient was aggravated d/t the alarms on his bed and telemetry monitor. Per the CNA, patient pulled off his telemetry leads, his CPAP, and got out of bed unassisted. I spoke with the patient and the patient stated "I'm tired of all these alarms. I haven't been able to sleep all night. The bed is going off and that thing up there (gesturing towards the telemetry monitor in the room) keeps ringing!" I asked the patient if I can assist him to get more comfortable. He said "yes". The patient agreed to sit in the recliner. I educated the patient about the purpose for his telemetry monitoring and he verbalized understanding. He allowed me to put his telemetry leads on him so that we could continue to monitor him. I further educated the patient about the purpose for the bed alarm. He stated "I'm not a fall risk." I asked the patient "have you had any falls in the last six months?" He replied with "but those were at home." I replied with "we consider all falls in the last six months when assessing for fall risk." He replied with "well, I don't want the bed alarm on." I asked the patient "are you refusing the bed alarm?" He stated "yes." I educated the patient regarding proper use of the call bell and when to call for assistance. He verbalized understanding and demonstrated proper use of the call bell. The above events were reported to the patient's primary nurse.

## 2020-11-13 NOTE — Evaluation (Signed)
Physical Therapy Evaluation Patient Details Name: Reginald [name] MRN: [id number] DOB: 08-Jul-1970 Today's Date: 11/13/2020   History of Present Illness  The pt is a 50 yo male presenting 6/24 with c/o black, liquid stool for 3-4 weeks. PMH includes: anxiety, back pain, CHF, COPD, HTN, pulmonary HTN, sleep apnea, and morbid obesity.   Clinical Impression  Pt in bed upon arrival of PT, agreeable to evaluation at this time. Prior to admission the pt was living alone in a home with no steps to enter, mobilizing with use of SPC, and completing all ADLs with increased time and effort but no assist. The pt now presents with limitations in functional mobility, strength, power, endurance, and dynamic stability due to above dx and chronically reduced mobility, and will continue to benefit from skilled PT to address these deficits. The pt was able to complete sit-stand transfers with use of SPC and minG for safety, but required minA and frequent standing rest breaks to complete 60 ft ambulation. The pt demos poor self-monitoring for exertion, poor safety awareness, and had single LOB during session due to turning to look behind him while walking. The pt is eager to return to independence, will continue to benefit from skilled PT acutely as well as following d/c to maximize endurance, activity tolerance, and stability to reduce risk of falls following d/c.      Follow Up Recommendations Home health PT;Supervision - Intermittent (would benefit most from aquatic physical therapy if available near pt home)    Equipment Recommendations  None recommended by PT    Recommendations for Other Services       Precautions / Restrictions Precautions Precautions: Fall Precaution Comments: pt reports frequent falls at home, mostly occuring outside on uneven ground Restrictions Weight Bearing Restrictions: No      Mobility  Bed Mobility Overal bed mobility: Modified Independent             General bed  mobility comments: pt OOB in recliner upon arrival, reports he got himself out of bed multiple times overnight without assist    Transfers Overall transfer level: Needs assistance Equipment used: Straight cane Transfers: Sit to/from Stand Sit to Stand: Min guard         General transfer comment: minG for safety, pt able to power up with significant increased time and effort  Ambulation/Gait Ambulation/Gait assistance: Min guard;Min assist Gait Distance (Feet): 60 Feet Assistive device: Straight cane Gait Pattern/deviations: Step-through pattern;Decreased stride length;Wide base of support Gait velocity: 0.2 m/s Gait velocity interpretation: <1.31 ft/sec, indicative of household ambulator General Gait Details: pt with wide BOS, significant lateral movements. pt with significant DOE, SpO2 mostly in 90s with x2 drop to 84% on RA, recovered to 90s with guided breathing. x1 LOB when pt turning quickly to look behind him, minA and support from door frame to recover      Balance Overall balance assessment: Needs assistance Sitting-balance support: No upper extremity supported;Feet supported Sitting balance-Leahy Scale: Good     Standing balance support: Single extremity supported;During functional activity Standing balance-Leahy Scale: Fair Standing balance comment: LOB with quick head movements or body turns                             Pertinent Vitals/Pain Pain Assessment: No/denies pain    Home Living Family/patient expects to be discharged to:: Private residence Living Arrangements: Alone Available Help at Discharge:  (none) Type of Home: House Home Access: Level entry  Home Layout: One level Home Equipment: Cane - single point;Shower seat      Prior Function Level of Independence: Independent         Comments: pt reports driving to grocery store daily, using scooter at store to get around. 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)    Cervical / Trunk Assessment Cervical / Trunk Assessment: Other exceptions Cervical / Trunk Exceptions: large body habitus  Communication   Communication: No difficulties  Cognition Arousal/Alertness: Awake/alert Behavior During Therapy: WFL for tasks assessed/performed Overall Cognitive Status: Impaired/Different from baseline Area of Impairment: Safety/judgement                         Safety/Judgement: Decreased awareness of deficits;Decreased awareness of safety     General Comments: pt able to follow all commands, decreased insight to need for assistance, fall risk, and impact of continued limited mobility      General Comments General comments (skin integrity, edema, etc.): SpO2 mostly in 90s with x2 drop to 84% on RA, recovered to 90s with guided breathing    Exercises     Assessment/Plan    PT Assessment Patient needs continued PT services  PT Problem List Decreased activity tolerance;Decreased balance;Decreased mobility;Cardiopulmonary status limiting activity;Obesity       PT Treatment Interventions DME instruction;Gait training;Stair training;Functional mobility training;Therapeutic activities;Therapeutic exercise;Balance training;Patient/family education    PT Goals (Current goals can be found in the Care Plan section)  Acute Rehab PT Goals Patient Stated Goal: return to living independently PT Goal Formulation: With patient Time For Goal Achievement: 11/27/20 Potential to Achieve Goals: Good    Frequency Min 3X/week    AM-PAC PT "6 Clicks" Mobility  Outcome Measure Help needed turning from  your back to your side while in a flat bed without using bedrails?: None Help needed moving from lying on your back to sitting on the side of a flat bed without using bedrails?: None Help needed moving to and from a bed to a chair (including a wheelchair)?: A Little Help needed standing up from a chair using your arms (e.g., wheelchair or bedside chair)?: A Little Help needed to walk in hospital room?: A Little Help needed climbing 3-5 steps with a railing? : A Little 6 Click Score: 20    End of Session Equipment Utilized During Treatment: Gait belt Activity Tolerance: Patient limited by fatigue Patient left: in chair;with call bell/phone within reach;with nursing/sitter in room Nurse Communication: Mobility status PT Visit Diagnosis: Unsteadiness on feet (R26.81);Other abnormalities of gait and mobility (R26.89)    Time: 9728-2060 PT Time Calculation (min) (ACUTE ONLY): 34 min   Charges:   PT Evaluation $PT Eval Low Complexity: 1 Low PT Treatments $Gait Training: 8-22 mins        Karma Ganja, PT, DPT   Acute Rehabilitation Department Pager #: [phone]  Otho Bellows 11/13/2020, 4:48 PM

## 2020-11-13 NOTE — H&P (View-Only) (Signed)
Consultation  Referring Provider:     Phillips Climes MD Primary Care Physician:  [name], MD Primary Gastroenterologist:        NA Reason for Consultation:     anemia, heme positive stool         HPI:   Reginald [name] is a 50 y.o. male with multiple medical problems to include COPD, history of diastolic CHF, history of pulmonary embolism and AF on Xarelto, AS s/p AVR, heart block with pacemaker in place, AAA, pulmonary hypertension, morbid obesity with BMI 68, consulted by hospitalist service for anemia and heme positive stool.  He was sent to the emergency room yesterday at [facility] for dark heme positive stool and worsening anemia.  Patient's baseline hemoglobin is normal on 13s, back in February was noted to be 12.3, in May was 10.4.  Yesterday was noted to be 10.2.  MCV of 81-82, no iron studies sent.  He reports worsening fatigue lately.  Sounds like he was placed on iron about a month ago, since that time he has had multiple dark black stools per day.  He states he has had rectal bleeding however for several months and irregular bowel habits.  He will have multiple BMs today, some normal, some with narrow caliber, has seen bright red blood in the stool and in the toilet bowl periodically.  He also has been having some upper abdominal pain in recent weeks as well as intermittent lower abdominal pain as well.  He had a CT scan done in the emergency room yesterday showing no acute pathology.  He does have stable hepatosplenomegaly on CT imaging and has had steatosis noted on prior ultrasound and CTs in the past.  He also has some prominent and mildly enlarged upper abdominal lymph nodes, presumed reactive per radiology.  He denies any weight loss, body mass index is 68.  He states he previously was taking NSAIDs for joint pains although has been off that for at least the past month or so and generally tries to avoid using NSAIDs.  He does take Xarelto, his last dose of that  was on Thursday night.  He has been hemodynamically stable since has been in our hospital and no significant bleeding symptoms.  He had stool sent for occult blood which was positive.  He is hungry and wants to eat, frustrated by alarms going off in his room and he did not sleep well.  He denies any prior endoscopy or colonoscopy.  Denies any family history of esophageal, gastric, or colon cancer, although does not know his father's side of the family.  He does take Protonix 40 mg once daily.  He denies really any heartburn symptoms that bother him on this regimen, but states he does have a history of reflux.  He denies any dysphagia.  His abdominal discomfort does seem mostly postprandial in his upper abdomen.  He has been noted to have gallstones on prior imaging.  Normal platelets he denies any history of cirrhosis that is known.  Does not have a significant alcohol history.  Otherwise states he feels okay today, wants to get out of the hospital as soon as he can but understands he warrants work-up for this.  Denies any chest pain or shortness of breath at this time     Past Medical History:  Diagnosis Date  . Anxiety   . Aortic stenosis   . Arthritis   . Asthma   . Back pain   .  Cellulitis of skin with lymphangitis   . CHF (congestive heart failure) ([place])   . Chronic diastolic congestive heart failure ([place])   . Chronic venous insufficiency   . Constipation   . COPD (chronic obstructive pulmonary disease) ([place])   . Depression   . Diabetes ([place])   . Dyspnea   . Essential hypertension   . Gout   . Heart murmur   . Hypertension   . Morbid obesity ([place])   . Obesity   . Pneumonia    walking pneumonia  . Prostatitis   . Pulmonary embolism ([place])   . Pulmonary hypertension ([place])   . Pyelonephritis   . S/P aortic valve replacement with bioprosthetic valve 08/23/2017   25 mm Edwards Inspiris Resilia stented bovine pericardial tissue valve  . 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    24 mm Hemashield supracoronary straight graft   . Sleep apnea    cpap   . Thoracic ascending aortic aneurysm ([place])   . Thoracic ascending aortic aneurysm ([place])   . Tobacco abuse     Past Surgical History:  Procedure Laterality Date  . AORTIC VALVE REPLACEMENT N/A 08/23/2017   Procedure: AORTIC VALVE REPLACEMENT (AVR) USING INSPIRIS RESILIA AORTIC VALVE SIZE 25 MM;  Surgeon: Rexene Alberts, MD;  Location: [place];  Service: Open Heart Surgery;  Laterality: N/A;  . CARDIAC VALVE REPLACEMENT N/A    Phreesia 02/07/2020  . MULTIPLE EXTRACTIONS WITH ALVEOLOPLASTY N/A 07/23/2017   Procedure: Extraction of tooth #10 with alveoloplasty and gross debridement of remaining teeth;  Surgeon: Lenn Cal, DDS;  Location: [place];  Service: Oral Surgery;  Laterality: N/A;  . PACEMAKER IMPLANT N/A 08/28/2017    St Jude Medical Assurity MRI conditional  dual-chamber pacemaker for symptomatic complete heart blockby Dr Rayann Heman  . TEE WITHOUT CARDIOVERSION N/A 07/16/2017   Procedure: TRANSESOPHAGEAL ECHOCARDIOGRAM (TEE);  Surgeon: Lelon Perla, MD;  Location: [facility] ENDOSCOPY;  Service: Cardiovascular;  Laterality: N/A;  . TEE WITHOUT CARDIOVERSION N/A 08/23/2017   Procedure: TRANSESOPHAGEAL ECHOCARDIOGRAM (TEE);  Surgeon: Rexene Alberts, MD;  Location: [facility];  Service: Open Heart Surgery;  Laterality: N/A;  . THORACIC AORTIC ANEURYSM REPAIR N/A 08/23/2017   Procedure: THORACIC ASCENDING ANEURYSM REPAIR (AAA) USING HEMASHIELD GOLD KNITTED MICROVEL DOUBLE VELOUR VASCULAR GRAFT D: 24 MM  L: 30 CM;  Surgeon: Rexene Alberts, MD;  Location: [place];  Service: Open Heart Surgery;  Laterality: N/A;    Family History  Problem Relation Age of Onset  . Hypertension Mother   . Alzheimer's disease Mother   . Heart attack Mother   . Heart attack Brother   . Diabetes Brother      Social History   Tobacco Use  . Smoking status: Former    Packs/day: 2.00    Years: 16.00    Pack years: 32.00    Types:  Cigarettes    Quit date: 08/23/2017    Years since quitting: 3.2  . Smokeless tobacco: Never  Vaping Use  . Vaping Use: Never used  Substance Use Topics  . Alcohol use: No    Comment: have had alcohol in the past, not heavy  . Drug use: No    Prior to Admission medications   Medication Sig Start Date End Date Taking? Authorizing Provider  acetaminophen (TYLENOL) 325 MG tablet Take 650 mg by mouth every 6 (six) hours as needed for mild pain.   Yes [provider]  albuterol (PROVENTIL) (2.5 MG/3ML) 0.083% nebulizer solution INHALE 1 VIAL  VIA NEBULIZER EVERY 6 HOURS AS NEEDED FOR WHEEZING OR SHORTNESS OF BREATH 08/05/20  Yes [name], MD  albuterol (VENTOLIN HFA) 108 (90 Base) MCG/ACT inhaler Inhale 2 puffs into the lungs every 6 (six) hours as needed for wheezing or shortness of breath. 06/10/20  Yes [name], MD  allopurinol (ZYLOPRIM) 300 MG tablet TAKE 1 TABLET EVERY DAY 11/11/20  Yes [name], MD  ammonium lactate (AMLACTIN) 12 % lotion Apply 1 application topically as needed for dry skin. 10/13/20  Yes Felipa Furnace, DPM  atorvastatin (LIPITOR) 40 MG tablet TAKE 1 TABLET EVERY DAY 09/20/20  Yes [name], MD  Blood Glucose Monitoring Suppl (TRUE METRIX METER) w/Device KIT USE AS DIRECTED 08/19/20  Yes [name], MD  busPIRone (BUSPAR) 10 MG tablet TAKE 1 TABLET THREE TIMES DAILY 10/19/20  Yes [name], MD  citalopram (CELEXA) 40 MG tablet TAKE 1 TABLET EVERY DAY 10/19/20  Yes [name], MD  diclofenac Sodium (VOLTAREN) 1 % GEL Apply 2 g topically 4 (four) times daily. 10/26/20  Yes Mordecai Rasmussen, MD  fexofenadine (ALLEGRA) 180 MG tablet Take 180 mg by mouth daily.   Yes [provider]  gabapentin (NEURONTIN) 100 MG capsule Take 1 capsule in the morning and 2 capsules in the evening. 10/01/20  Yes [name], MD  glipiZIDE (GLUCOTROL XL) 5 MG 24 hr tablet Take 1 tablet (5 mg total) by mouth daily with breakfast. 10/08/20  Yes  [name], MD  Iron, Ferrous Sulfate, 325 (65 Fe) MG TABS Take 325 mg by mouth daily. 10/08/20  Yes [name], MD  Krill Oil 350 MG CAPS Take 1 capsule by mouth in the morning and at bedtime.    Yes [provider]  lisinopril (ZESTRIL) 20 MG tablet Take 1 tablet (20 mg total) by mouth daily. 04/21/20 11/12/20 Yes Strader, Fransisco Hertz, PA-C  loratadine (CLARITIN) 10 MG tablet Take 10 mg by mouth daily as needed for allergies.   Yes [provider]  metFORMIN (GLUCOPHAGE) 850 MG tablet Take 1 tablet (850 mg total) by mouth 2 (two) times daily with a meal. 10/08/20  Yes [name], MD  metolazone (ZAROXOLYN) 5 MG tablet PRN Patient taking differently: Take 5 mg by mouth daily as needed (swelling). PRN 06/21/20  Yes Allred, Jeneen Rinks, MD  metoprolol tartrate (LOPRESSOR) 100 MG tablet Take 1 tablet (100 mg total) by mouth 2 (two) times daily. 02/26/20  Yes Strader, Johnston, PA-C  mupirocin ointment (BACTROBAN) 2 % Apply 1 application topically 2 (two) times daily. 10/01/20  Yes [name], MD  pantoprazole (PROTONIX) 40 MG tablet Take 1 tablet (40 mg total) by mouth daily. 07/14/20  Yes [name], MD  potassium chloride SA (KLOR-CON) 20 MEQ tablet TAKE 3 TABS DAILY AND TAKE AN ADDITIONAL 1 TAB ON THE DAY YOU TAKE YOUR WEEKLY METOLAZONE DOSE 05/13/20  Yes Strader, [place] M, PA-C  rivaroxaban (XARELTO) 20 MG TABS tablet TAKE 1 Tablet BY MOUTH ONCE EVERY DAY WITH SUPPER 10/20/20  Yes Josue Hector, MD  terbinafine (LAMISIL) 250 MG tablet Take 1 tablet (250 mg total) by mouth daily. 10/01/20  Yes [name], MD  torsemide (DEMADEX) 20 MG tablet Take 2 tablets (40 mg total) by mouth 2 (two) times daily. 04/21/20 11/12/20 Yes Strader, Fransisco Hertz, PA-C  TRUE METRIX BLOOD GLUCOSE TEST test strip TEST ONE TIME DAILY FOR DIABETES 10/25/20  Yes Posey Pronto, Gregor Hams  K, MD  TRUEplus Lancets 33G MISC USE TO TEST ONE TIME DAILY FOR DIABETES 10/25/20  Yes [name], MD  docusate  sodium (COLACE) 100 MG capsule Take 200 mg by mouth daily as needed for mild constipation. Patient not taking: No sig reported    [provider]  guaiFENesin (MUCINEX) 600 MG 12 hr tablet Take 600 mg by mouth 2 (two) times daily. Patient not taking: Reported on 11/12/2020    [provider]    Current Facility-Administered Medications  Medication Dose Route Frequency Provider Last Rate Last Admin  . acetaminophen (TYLENOL) tablet 650 mg  650 mg Oral Q6H PRN Elgergawy, Silver Huguenin, MD   650 mg at 11/13/20 0539  . allopurinol (ZYLOPRIM) tablet 300 mg  300 mg Oral Daily Elgergawy, Silver Huguenin, MD      . atorvastatin (LIPITOR) tablet 40 mg  40 mg Oral Daily Elgergawy, Silver Huguenin, MD   40 mg at 11/12/20 2148  . busPIRone (BUSPAR) tablet 10 mg  10 mg Oral TID Elgergawy, Silver Huguenin, MD   10 mg at 11/12/20 2146  . citalopram (CELEXA) tablet 40 mg  40 mg Oral Daily Elgergawy, Dawood S, MD      . insulin aspart (novoLOG) injection 0-15 Units  0-15 Units Subcutaneous TID WC Elgergawy, Silver Huguenin, MD      . insulin aspart (novoLOG) injection 0-5 Units  0-5 Units Subcutaneous QHS Elgergawy, Silver Huguenin, MD      . metoprolol tartrate (LOPRESSOR) tablet 100 mg  100 mg Oral BID Elgergawy, Silver Huguenin, MD   100 mg at 11/12/20 2145  . pantoprazole (PROTONIX) injection 40 mg  40 mg Intravenous Q12H Elgergawy, Silver Huguenin, MD   40 mg at 11/12/20 2146  . torsemide (DEMADEX) tablet 40 mg  40 mg Oral Daily Elgergawy, Silver Huguenin, MD        Allergies as of 11/12/2020  . (No Known Allergies)     Review of Systems:    As per HPI, otherwise negative    Physical Exam:  Vital signs in last 24 hours: Temp:  [97.7 F (36.5 C)-99.2 F (37.3 C)] 97.7 F (36.5 C) (06/25 0531) Pulse Rate:  [58-66] 60 (06/25 0531) Resp:  [11-23] 16 (06/25 0531) BP: (98-130)/(36-93) 130/74 (06/25 0531) SpO2:  [94 %-100 %] 94 % (06/25 0531) Weight:  [221.8 kg] 221.8 kg (06/24 1307) Last BM Date: 11/12/20 General:   Pleasant male in  NAD Head:  Normocephalic and atraumatic. Eyes:   No icterus.   Conjunctiva pink. Ears:  Normal auditory acuity. Neck:  Supple Lungs: Respirations even and unlabored. CTA B Heart:  Regular rate and rhythm; n Abdomen:  Soft, protuberant abdomen with large pannus, nontender.  Rectal:  Not performed.  Msk:  Symmetrical without gross deformities.  Extremities:  Without edema. Neurologic:  Alert and  oriented x4;  grossly normal neurologically. Skin:  Intact without significant lesions or rashes. Psych:  Alert and cooperative. Normal affect.  LAB RESULTS: Recent Labs    11/12/20 1132 11/12/20 2239  WBC 10.6* 11.9*  HGB 10.2* 10.1*  HCT 34.2* 33.5*  PLT 257 278   BMET Recent Labs    11/12/20 1132 11/13/20 0128  NA 138 138  K 3.9 3.7  CL 102 104  CO2 29 27  GLUCOSE 108* 95  BUN 23* 19  CREATININE 1.26* 1.33*  CALCIUM 9.0 9.0   LFT Recent Labs    11/13/20 0128  PROT 7.2  ALBUMIN 3.3*  AST 29  ALT  21  ALKPHOS 69  BILITOT 1.1   PT/INR Recent Labs    11/12/20 1132  LABPROT 19.4*  INR 1.6*    STUDIES: CT Abdomen Pelvis W Contrast  Result Date: 11/12/2020 CLINICAL DATA:  Abdominal pain with black stools for 3 weeks. EXAM: CT ABDOMEN AND PELVIS WITH CONTRAST TECHNIQUE: Multidetector CT imaging of the abdomen and pelvis was performed using the standard protocol following bolus administration of intravenous contrast. CONTRAST:  178m OMNIPAQUE IOHEXOL 300 MG/ML  SOLN COMPARISON:  03/08/2019. FINDINGS: Lower chest: Clear lung bases. Hepatobiliary: Liver enlarged. No mass or focal lesion. Mildly distended gallbladder. Small densities noted in the dependent gallbladder consistent with stones. No wall thickening. No inflammation. No duct dilation. Pancreas: Unremarkable. No pancreatic ductal dilatation or surrounding inflammatory changes. Spleen: Spleen enlarged, 20 cm in greatest transverse dimension. No splenic mass or focal lesion. 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 No other adrenal masses. Kidneys normal in size, orientation and position with symmetric enhancement and excretion. No mass, stone or hydronephrosis. Normal ureters. Normal bladder. Stomach/Bowel: Normal stomach. Small bowel and colon are normal in caliber. No wall thickening. No inflammation. No evidence of appendicitis. Vascular/Lymphatic: Prominent and mildly enlarged upper abdominal lymph nodes. Peri celiac node measures 1.9 cm in short axis. Posterior gastrohepatic ligament lymph node measures 1.5 cm in short axis. Nodes are similar to the prior CT. Mild aortic atherosclerosis. No aneurysm. Reproductive: Unremarkable. Other: No abdominal wall hernia. No ascites. Nonspecific subcutaneous soft tissue edema noted along the abdomen and flanks. No fluid collection to suggest an abscess. Musculoskeletal: No fracture or acute finding.  No bone lesion. IMPRESSION: 1. No acute findings within the abdomen or pelvis.  No abscess. 2. Hepato splenomegaly stable compared to the prior CT. 3. Prominent and mildly enlarged upper abdominal lymph nodes, presumed reactive, and essentially stable from the prior CT. 4. Mild aortic atherosclerosis. Electronically Signed   By: DLajean ManesM.D.   On: 11/12/2020 15:02     PREVIOUS ENDOSCOPIES:            None    Impression / Plan:   50year old male with multiple medical problems as outlined above, admitted to the hospital with the following issues:  Anemia Heme positive stool Rectal bleeding  Has had a progressive anemia over a few months.  While he has dark stools for the past 4 weeks this correlates really well with his iron use and I suspect related, seems unlikely to be active upper GI bleed.  However over several months he has had intermittent rectal bleeding and irregular bowel habits.  He is on Xarelto.  He needs iron studies checked to confirm suspected iron deficiency.  We discussed differential diagnosis for his anemia and  symptoms, and what work-up would entail.  I am recommending an upper endoscopy and colonoscopy to evaluate this initially. While I don't think he is having any significant active bleeding, given his significant comorbidities and his weight with BMI of 68, he is higher than average risk for anesthesia and case will need to be done in the hospital for anesthesia support.  We discussed EGD and colonoscopy, anesthesia, and risks of this, and after this discussion he wishes to proceed.  His last dose of Xarelto was Thursday night, I think okay to proceed with these exams on Sunday as this should be flushed out of his system by then.  We will keep on clear liquid diet today, bowel prep tonight, with plans for procedure tomorrow.  We will  continue to hold his Xarelto.  We will check iron studies today.  Of note he has severe hepatic steatosis on CT imaging with hepatosplenomegaly, at risk for cirrhosis.  LFTs look okay, platelets normal, but clearly warrants weight loss moving forward for multiple reasons.  This issue can be addressed as an outpatient.  Plan: - clear liquid diet today, NPO after MN - hold Xarelto  - bowel prep tonight - EGD and colonoscopy tomorrow pending anesthesia availability, with Dr. Carlean Purl.  - labs for iron studies today - continue empiric IV protonix for now  Will follow, call with questions.  Jolly Mango, MD [facility]

## 2020-11-13 NOTE — TOC Initial Note (Addendum)
Transition of Care [facility]) - Initial/Assessment Note    Patient Details  Name: ARVILLE POSTLEWAITE MRN: [id number] Date of Birth: [birth year]  Transition [facility] Care [facility] CM/SW Contact:    Sharin Mons, RN Phone Number: 11/13/2020, 11:47 AM  Clinical Narrative:                 Presents with abd pain. Hx of HTN Pre-diabetes, CHF, Aortic stenosis s/p AVR, AAA, pul. HTN, OSA, A. Fib., PE , COPD, anemia. From home alone. Pt states independent with ADL's. Pt with showerchair and cane @ home, rollator broken.  Pt states on disability. Drives self to appointments.   PCP: Ihor Dow  Consult received regarding medication assistance. NCM spoke with concerning matter. Pt states has problems affording Xaralto ,applied to manufacturer and  medication given at a cost of $ 250.00 / 49months. Pt states still costly. NCM unable to assist with manufacturer cost.   Pt with multi comorbidities could benefit from home health services ( disease and medication management ). Pt agreeable to home health services. Choice provided . Pt without preference . Referral made with [facility], acceptance pending.  Referral made with [facility] for community support.  TOC team will continue to monitor and assist with TOC needs.  Expected Discharge Plan: [place] Barriers to Discharge: Continued Medical Work up   Patient Goals and CMS Choice     Choice offered to / list presented to : Patient  Expected Discharge Plan and Services Expected Discharge Plan: [place]   Discharge Planning Services: CM Consult   Living arrangements for the past 2 months: Single Family Home                           HH Arranged: RN, Social Work [place] Agency: [place] Date [place]: 11/13/20 Time [place]: 28 Representative spoke with at [place]  Prior Living Arrangements/Services Living arrangements for the past 2 months: [place] with:: Self Patient language and need for interpreter reviewed:: Yes Do you feel safe going back to the place where you live?: Yes      Need for Family Participation in Patient Care: Yes (Comment) Care giver support system in place?: No (comment) Current home services: DME (cane, shower chair) Criminal Activity/Legal Involvement Pertinent to Current Situation/Hospitalization: No - Comment as needed  Activities of Daily Living Home Assistive Devices/Equipment: Hand-held shower hose, Other (Comment) (grabber) ADL Screening (condition at time of admission) Patient's cognitive ability adequate to safely complete daily activities?: Yes Is the patient deaf or have difficulty hearing?: No Does the patient have difficulty seeing, even when wearing glasses/contacts?: No Does the patient have difficulty concentrating, remembering, or making decisions?: No Patient able to express need for assistance with ADLs?: Yes Does the patient have difficulty dressing or bathing?: Yes Independently performs ADLs?: No Communication: Independent Dressing (OT): Independent Grooming: Independent Feeding: Independent Bathing: Needs assistance Is this a change from baseline?: Pre-admission baseline Toileting: Needs assistance Is this a change from baseline?: Pre-admission baseline In/Out Bed: Independent Walks in Home: Independent Does the patient have difficulty walking or climbing stairs?: Yes Weakness of Legs: Both Weakness of Arms/Hands: None  Permission Sought/Granted   Permission granted to share information with : Yes, Verbal Permission Granted              Emotional Assessment Appearance:: Appears stated age Attitude/Demeanor/Rapport: Gracious Affect (typically  observed): Accepting Orientation: : Oriented to Self, Oriented to Place, Oriented to  Time, Oriented to Situation Alcohol / Substance Use: Not Applicable Psych Involvement: No (comment)  Admission diagnosis:  GI bleed  [K92.2] Gastrointestinal hemorrhage, unspecified gastrointestinal hemorrhage type [K92.2] Patient Active Problem List   Diagnosis Date Noted   Anemia    Rectal bleeding    Heme positive stool    Anticoagulated    Chronic blood loss anemia 11/12/2020   GI bleed 11/12/2020   Gastrointestinal hemorrhage 10/08/2020   S/P placement of cardiac pacemaker 02/10/2020   Encounter for examination following treatment at hospital 02/10/2020   Anxiety 02/10/2020   DM2 (diabetes mellitus, type 2) ([place]) 02/10/2020   OSA on CPAP    Abnormal liver function 03/16/2019   History of pulmonary embolism 03/16/2019   Paroxysmal atrial fibrillation ([place]) 09/05/2018   COPD (chronic obstructive pulmonary disease) ([place]) 05/07/2018   Gastroesophageal reflux disease    Pulmonary embolism ([place]) 09/18/2017   Encounter for therapeutic drug monitoring 09/04/2017   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 08/23/2017   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   Pulmonary hypertension ([place])    Chronic periodontitis 07/18/2017   Morbid obesity ([place])    Essential hypertension    Tobacco abuse    Chronic heart failure with preserved ejection fraction (HFpEF) ([place])    Chronic venous insufficiency    PCP:  [name], MD Pharmacy:   [facility] [address], [place] [address] Phone: [phone] Fax: [facility] - [place], [place] [address] Phone: [phone] Fax: [phone]     Social Determinants of Health (SDOH) Interventions    Readmission Risk Interventions No flowsheet data found.

## 2020-11-14 ENCOUNTER — Inpatient Hospital Stay (HOSPITAL_COMMUNITY): Payer: Medicare HMO | Admitting: Anesthesiology

## 2020-11-14 ENCOUNTER — Encounter (HOSPITAL_COMMUNITY)
Admission: EM | Disposition: A | Discharge status: Home Health | Payer: Self-pay | Source: Home / Self Care | Attending: Internal Medicine

## 2020-11-14 DIAGNOSIS — D509 Iron deficiency anemia, unspecified: Secondary | ICD-10-CM

## 2020-11-14 HISTORY — PX: COLONOSCOPY WITH PROPOFOL: SHX5780

## 2020-11-14 HISTORY — PX: ESOPHAGOGASTRODUODENOSCOPY (EGD) WITH PROPOFOL: SHX5813

## 2020-11-14 LAB — CBC
HCT: 36.1 % — ABNORMAL LOW (ref 39.0–52.0)
Hemoglobin: 10.7 g/dL — ABNORMAL LOW (ref 13.0–17.0)
MCH: 24.5 pg — ABNORMAL LOW (ref 26.0–34.0)
MCHC: 29.6 g/dL — ABNORMAL LOW (ref 30.0–36.0)
MCV: 82.8 fL (ref 80.0–100.0)
Platelets: 245 10*3/uL (ref 150–400)
RBC: 4.36 MIL/uL (ref 4.22–5.81)
RDW: 17.9 % — ABNORMAL HIGH (ref 11.5–15.5)
WBC: 9.7 10*3/uL (ref 4.0–10.5)
nRBC: 0 % (ref 0.0–0.2)

## 2020-11-14 LAB — COMPREHENSIVE METABOLIC PANEL
ALT: 23 U/L (ref 0–44)
AST: 30 U/L (ref 15–41)
Albumin: 3.6 g/dL (ref 3.5–5.0)
Alkaline Phosphatase: 72 U/L (ref 38–126)
Anion gap: 8 (ref 5–15)
BUN: 16 mg/dL (ref 6–20)
CO2: 28 mmol/L (ref 22–32)
Calcium: 9.1 mg/dL (ref 8.9–10.3)
Chloride: 100 mmol/L (ref 98–111)
Creatinine, Ser: 1.37 mg/dL — ABNORMAL HIGH (ref 0.61–1.24)
GFR, Estimated: >60 mL/min (ref >60)
Glucose, Bld: 103 mg/dL — ABNORMAL HIGH (ref 70–99)
Potassium: 3.6 mmol/L (ref 3.5–5.1)
Sodium: 136 mmol/L (ref 135–145)
Total Bilirubin: 1.2 mg/dL (ref 0.3–1.2)
Total Protein: 7.8 g/dL (ref 6.5–8.1)

## 2020-11-14 LAB — GLUCOSE, CAPILLARY
Glucose-Capillary: 108 mg/dL — ABNORMAL HIGH (ref 70–99)
Glucose-Capillary: 100 mg/dL — ABNORMAL HIGH (ref 70–99)
Glucose-Capillary: 160 mg/dL — ABNORMAL HIGH (ref 70–99)
Glucose-Capillary: 94 mg/dL (ref 70–99)
Glucose-Capillary: 97 mg/dL (ref 70–99)

## 2020-11-14 LAB — MAGNESIUM: Magnesium: 2.2 mg/dL (ref 1.7–2.4)

## 2020-11-14 SURGERY — COLONOSCOPY WITH PROPOFOL
Anesthesia: Monitor Anesthesia Care

## 2020-11-14 MED ORDER — PROPOFOL 500 MG/50ML IV EMUL
INTRAVENOUS | Status: DC | PRN
  Administered 2020-11-14: 100 ug/kg/min via INTRAVENOUS

## 2020-11-14 MED ORDER — LACTATED RINGERS IV SOLN: INTRAVENOUS | Status: DC

## 2020-11-14 MED ORDER — HYDROCORTISONE (PERIANAL) 2.5 % EX CREA
TOPICAL_CREAM | Freq: Two times a day (BID) | CUTANEOUS | Status: DC | PRN
  Filled 2020-11-14: qty 28.35

## 2020-11-14 MED ORDER — PROPOFOL 10 MG/ML IV BOLUS
INTRAVENOUS | Status: DC | PRN
  Administered 2020-11-14: 20 mg via INTRAVENOUS

## 2020-11-14 MED ORDER — PANTOPRAZOLE SODIUM 40 MG PO TBEC
40.0000 mg | DELAYED_RELEASE_TABLET | Freq: Two times a day (BID) | ORAL | Status: DC
  Administered 2020-11-14 – 2020-11-15 (×2): 40 mg via ORAL
  Filled 2020-11-14 (×2): qty 1

## 2020-11-14 SURGICAL SUPPLY — 25 items

## 2020-11-14 NOTE — Anesthesia Preprocedure Evaluation (Addendum)
Anesthesia Evaluation  Patient identified by MRN, date of birth, ID band Patient awake    Reviewed: Allergy & Precautions, NPO status , Patient's Chart, lab work & pertinent test results, reviewed documented beta blocker date and time   Airway Mallampati: I  TM Distance: >3 FB Neck ROM: Full    Dental  (+) Dental Advisory Given, Missing,    Pulmonary shortness of breath, asthma , sleep apnea and Continuous Positive Airway Pressure Ventilation , COPD,  COPD inhaler, former smoker,    Pulmonary exam normal breath sounds clear to auscultation       Cardiovascular hypertension, Pt. on medications and Pt. on home beta blockers pulmonary hypertension+CHF  Normal cardiovascular exam+ Valvular Problems/Murmurs (s/p AVR, Ascending aorta replacement) AS  Rhythm:Regular Rate:Normal     Neuro/Psych PSYCHIATRIC DISORDERS Anxiety Depression negative neurological ROS     GI/Hepatic Neg liver ROS, GERD  ,  Endo/Other  diabetes, Type 2, Oral Hypoglycemic AgentsMorbid obesity (BMI 68)  Renal/GU negative Renal ROS     Musculoskeletal  (+) Arthritis ,   Abdominal   Peds  Hematology  (+) Blood dyscrasia (Xarelto), anemia ,   Anesthesia Other Findings   Reproductive/Obstetrics                            Anesthesia Physical Anesthesia Plan  ASA: 4  Anesthesia Plan: MAC   Post-op Pain Management:    Induction: Intravenous  PONV Risk Score and Plan: 1 and Propofol infusion  Airway Management Planned: Natural Airway and Nasal Cannula  Additional Equipment:   Intra-op Plan:   Post-operative Plan:   Informed Consent: I have reviewed the patients History and Physical, chart, labs and discussed the procedure including the risks, benefits and alternatives for the proposed anesthesia with the patient or authorized representative who has indicated his/her understanding and acceptance.     Dental advisory  given  Plan Discussed with: CRNA  Anesthesia Plan Comments:        Anesthesia Quick Evaluation

## 2020-11-14 NOTE — Discharge Instructions (Addendum)
Follow with Primary MD [name], MD in 7 days   Get CBC, CMP -  checked next visit within 1 week by Primary MD   Activity: As tolerated with Full fall precautions use walker/cane & assistance as needed  Disposition Home   Diet: Heart Healthy Low Carb  Accuchecks 4 times/day, Once in AM empty stomach and then before each meal. Log in all results and show them to your Prim.MD in 3 days. If any glucose reading is under 80 or above 300 call your Prim MD immidiately. Follow Low glucose instructions for glucose under 80 as instructed.  Special Instructions: If you have smoked or chewed Tobacco  in the last 2 yrs please stop smoking, stop any regular Alcohol  and or any Recreational drug use.  On your next visit with your primary care physician please Get Medicines reviewed and adjusted.  Please request your Prim.MD to go over all Hospital Tests and Procedure/Radiological results at the follow up, please get all Hospital records sent to your Prim MD by signing hospital release before you go home.  If you experience worsening of your admission symptoms, develop shortness of breath, life threatening emergency, suicidal or homicidal thoughts you must seek medical attention immediately by calling 911 or calling your MD immediately  if symptoms less severe.  You Must read complete instructions/literature along with all the possible adverse reactions/side effects for all the Medicines you take and that have been prescribed to you. Take any new Medicines after you have completely understood and accpet all the possible adverse reactions/side effects.    Information on my medicine - XARELTO (Rivaroxaban)  This medication education was reviewed with me or my healthcare representative as part of my discharge preparation.  The pharmacist that spoke with me during my hospital stay was:    Why was Xarelto prescribed for you? Xarelto was prescribed for you to reduce the risk of a blood clot  forming that can cause a stroke if you have a medical condition called atrial fibrillation (a type of irregular heartbeat).  What do you need to know about xarelto ? Take your Xarelto ONCE DAILY at the same time every day with your evening meal. If you have difficulty swallowing the tablet whole, you may crush it and mix in applesauce just prior to taking your dose.  Take Xarelto exactly as prescribed by your doctor and DO NOT stop taking Xarelto without talking to the doctor who prescribed the medication.  Stopping without other stroke prevention medication to take the place of Xarelto may increase your risk of developing a clot that causes a stroke.  Refill your prescription before you run out.  After discharge, you should have regular check-up appointments with your healthcare provider that is prescribing your Xarelto.  In the future your dose may need to be changed if your kidney function or weight changes by a significant amount.  What do you do if you miss a dose? If you are taking Xarelto ONCE DAILY and you miss a dose, take it as soon as you remember on the same day then continue your regularly scheduled once daily regimen the next day. Do not take two doses of Xarelto at the same time or on the same day.   Important Safety Information A possible side effect of Xarelto is bleeding. You should call your healthcare provider right away if you experience any of the following: Bleeding from an injury or your nose that does not stop. Unusual colored urine (red or  dark brown) or unusual colored stools (red or black). Unusual bruising for unknown reasons. A serious fall or if you hit your head (even if there is no bleeding).  Some medicines may interact with Xarelto and might increase your risk of bleeding while on Xarelto. To help avoid this, consult your healthcare provider or pharmacist prior to using any new prescription or non-prescription medications, including herbals, vitamins,  non-steroidal anti-inflammatory drugs (NSAIDs) and supplements.  This website has more information on Xarelto: [email or web address]

## 2020-11-14 NOTE — Interval H&P Note (Signed)
History and Physical Interval Note:  11/14/2020 9:07 AM  Reginald [name]  has presented today for surgery, with the diagnosis of anemia. blood in stool.  The various methods of treatment have been discussed with the patient and family. After consideration of risks, benefits and other options for treatment, the patient has consented to  Procedure(s): COLONOSCOPY WITH PROPOFOL (N/A) ESOPHAGOGASTRODUODENOSCOPY (EGD) WITH PROPOFOL (N/A) as a surgical intervention.  [name] history has been reviewed, patient examined, no change in status, stable for surgery.  I have reviewed the patient's chart and labs.  Questions were answered to the patient's satisfaction.     Silvano Rusk

## 2020-11-14 NOTE — Progress Notes (Signed)
PROGRESS NOTE                                                                                                                                                                                                             Patient Demographics:    Reginald [name], is a 50 y.o. male, DOB - Dec 24, 1970, NHA:[id number]  Outpatient Primary MD for the patient is [name], MD    LOS - 1  Admit date - 11/12/2020    Chief Complaint  Patient presents with   Abdominal Pain       Brief Narrative (HPI from H&P)    - Reginald [name]  is a 50 y.o. male,  Hypertension, Pre-diabetes, CHF (diastolic), Aortic stenosis s/p AVR, AAA, Copd, pulmonary hypertension, OSA, A. fib on anticoagulation, PE , COPD, patient with history of anemia, where he was started on oral iron supplements 3 to 4 weeks ago, patient reports black, liquid stools for last 3 weeks, as well he does report intermittent bright red blood per rectum for the last couple weeks, he presented to [facility] and from there he was sent to [facility] for further evaluation.  H&H is stable.   Subjective:   Patient in bed, appears comfortable, denies any headache, no fever, no chest pain or pressure, no shortness of breath , no abdominal pain. No new focal weakness.   Assessment  & Plan :      Black stools and some bright red blood per rectum with lower GI bleed - his H&H is essentially unremarkable, I think his black stools are due to oral iron supplementation and bright red blood is consistent with hemorrhoidal bleed, hold Eliquis, PPI, monitoring H&H GI to see and planning to do EGD colonoscopy later on 11/14/2020.  Patient is agreeable for transfusion if needed.  Will monitor closely, CBC pending.  2.  Morbid obesity with OSA and fatty liver.  Follow with PCP for weight loss CPAP nightly.  3.  Chronic diastolic CHF EF 83% on last echocardiogram.  On beta-blocker, low-dose torsemide  to continue, monitor.  4.  CAD s/p CABG.  Continue beta-blocker, statin can [place] prevention.  Eliquis on hold due to #1 above.  5.  Paroxysmal A. fib.  Mali vas 2 score of greater than 3.  On beta-blocker.  Eliquis on hold as above.  6.  History of complete heart block.  Has pacemaker.  7.  History of gout.  Stable on allopurinol.    8. DM type II.  For now on sliding scale monitor and adjust.  Lab Results  Component Value Date   HGBA1C 5.4 11/12/2020   CBG (last 3)  Recent Labs    11/13/20 1602 11/13/20 2100 11/14/20 0748  GLUCAP 103* 95 108*    Obesity: Follow with PCP for weight loss. Estimated body mass index is 68.2 kg/m as calculated from the following:   Height as of this encounter: 5\' 11"  (1.803 m).   Weight as of this encounter: 221.8 kg.          Condition - Fair  Family Communication  :  None bedside  Code Status :  Full  Consults  :  GI  PUD Prophylaxis : PPI   Procedures  :     EGD  Colonoscopy.  CT scan abdomen pelvis.  Fatty liver.  Reactive but stable abdominal lymphadenopathy.      Disposition Plan  :    Status is: Observation    Dispo: The patient is from: Home              Anticipated d/c is to: Home              Patient currently is not medically stable to d/c.   Difficult to place patient No  DVT Prophylaxis  :    SCDs Start: 11/12/20 2110  Lab Results  Component Value Date   PLT 278 11/12/2020    Diet :  Diet Order             Diet NPO time specified Except for: Sips with Meds  Diet effective midnight                    Inpatient Medications  Scheduled Meds:  allopurinol  300 mg Oral Daily   atorvastatin  40 mg Oral Daily   busPIRone  10 mg Oral TID   citalopram  40 mg Oral Daily   insulin aspart  0-15 Units Subcutaneous TID WC   insulin aspart  0-5 Units Subcutaneous QHS   metoprolol tartrate  100 mg Oral BID   pantoprazole (PROTONIX) IV  40 mg Intravenous Q12H   torsemide  40 mg Oral Daily    Continuous Infusions: PRN Meds:.acetaminophen  Antibiotics  :    Anti-infectives (From admission, onward)    None        Time Spent in minutes  30   Lala Lund M.D on 11/14/2020 at 8:57 AM  To page go to [email or web address]   [facility] -  Office  [phone]  See all Orders from today for further details    Objective:   Vitals:   11/13/20 2341 11/14/20 0426 11/14/20 0742 11/14/20 0845  BP: 130/77 107/72 (!) 144/86 (!) 146/51  Pulse: 60 60 61   Resp: 16 19 19 18   Temp: 98 F (36.7 C) 98.8 F (37.1 C) 98.7 F (37.1 C) 99.1 F (37.3 C)  TempSrc: Oral Axillary Axillary Oral  SpO2: 93% 94% 94% 100%  Weight:      Height:        Wt Readings from Last 3 Encounters:  11/12/20 (!) 221.8 kg  11/12/20 (!) 226.8 kg  10/26/20 (!) 220.9 kg     Intake/Output Summary (Last 24 hours) at 11/14/2020 0857 Last data filed at 11/14/2020 0430 Gross per 24 hour  Intake 120  ml  Output 1525 ml  Net -1405 ml     Physical Exam  Awake Alert, No new F.N deficits, Normal affect [place].AT,PERRAL Supple Neck,No JVD, No cervical lymphadenopathy appriciated.  Symmetrical Chest wall movement, Good air movement bilaterally, CTAB RRR,No Gallops, Rubs or new Murmurs, No Parasternal Heave +ve B.Sounds, Abd Soft, No tenderness, No organomegaly appriciated, No rebound - guarding or rigidity. No Cyanosis, Clubbing or edema, No new Rash or bruise      Data Review:    CBC Recent Labs  Lab 11/12/20 1132 11/12/20 2239  WBC 10.6* 11.9*  HGB 10.2* 10.1*  HCT 34.2* 33.5*  PLT 257 278  MCV 85.1 82.1  MCH 25.4* 24.8*  MCHC 29.8* 30.1  RDW 18.0* 17.9*  LYMPHSABS 2.0  --   MONOABS 0.6  --   EOSABS 0.3  --   BASOSABS 0.0  --     Recent Labs  Lab 11/12/20 1132 11/13/20 0128 11/14/20 0052  NA 138 138 136  K 3.9 3.7 3.6  CL 102 104 100  CO2 29 27 28   GLUCOSE 108* 95 103*  BUN 23* 19 16  CREATININE 1.26* 1.33* 1.37*  CALCIUM 9.0 9.0 9.1  AST 29 29 30   ALT 24 21 23    ALKPHOS 75 69 72  BILITOT 1.0 1.1 1.2  ALBUMIN 3.6 3.3* 3.6  MG  --   --  2.2  INR 1.6*  --   --   HGBA1C 5.4  --   --     ------------------------------------------------------------------------------------------------------------------ No results for input(s): CHOL, HDL, LDLCALC, TRIG, CHOLHDL, LDLDIRECT in the last 72 hours.  Lab Results  Component Value Date   HGBA1C 5.4 11/12/2020   ------------------------------------------------------------------------------------------------------------------ No results for input(s): TSH, T4TOTAL, T3FREE, THYROIDAB in the last 72 hours.  Invalid input(s): FREET3  Cardiac Enzymes No results for input(s): CKMB, TROPONINI, MYOGLOBIN in the last 168 hours.  Invalid input(s): CK ------------------------------------------------------------------------------------------------------------------    Component Value Date/Time   BNP 70.0 07/09/2020 2131    Micro Results Recent Results (from the past 240 hour(s))  Resp Panel by RT-PCR (Flu A&B, Covid) Nasopharyngeal Swab     Status: None   Collection Time: 11/12/20  4:25 PM   Specimen: Nasopharyngeal Swab; Nasopharyngeal(NP) swabs in vial transport medium  Result Value Ref Range Status   SARS Coronavirus 2 by RT PCR NEGATIVE NEGATIVE Final    Comment: (NOTE) SARS-CoV-2 target nucleic acids are NOT DETECTED.  The SARS-CoV-2 RNA is generally detectable in upper respiratory specimens during the acute phase of infection. The lowest concentration of SARS-CoV-2 viral copies this assay can detect is 138 copies/mL. A negative result does not preclude SARS-Cov-2 infection and should not be used as the sole basis for treatment or other patient management decisions. A negative result may occur with  improper specimen collection/handling, submission of specimen other than nasopharyngeal swab, presence of viral mutation(s) within the areas targeted by this assay, and inadequate number of  viral copies(<138 copies/mL). A negative result must be combined with clinical observations, patient history, and epidemiological information. The expected result is Negative.  Fact Sheet for Patients:  EntrepreneurPulse.com.au  Fact Sheet for Healthcare Providers:  IncredibleEmployment.be  This test is no t yet approved or cleared by the [place] FDA and  has been authorized for detection and/or diagnosis of SARS-CoV-2 by FDA under an Emergency Use Authorization (EUA). This EUA will remain  in effect (meaning this test can be used) for the duration of the COVID-19 declaration under Section 564(b)(1) of the  Act, 21 U.S.C.section 360bbb-3(b)(1), unless the authorization is terminated  or revoked sooner.       Influenza A by PCR NEGATIVE NEGATIVE Final   Influenza B by PCR NEGATIVE NEGATIVE Final    Comment: (NOTE) The Xpert Xpress SARS-CoV-2/FLU/RSV plus assay is intended as an aid in the diagnosis of influenza from Nasopharyngeal swab specimens and should not be used as a sole basis for treatment. Nasal washings and aspirates are unacceptable for Xpert Xpress SARS-CoV-2/FLU/RSV testing.  Fact Sheet for Patients: EntrepreneurPulse.com.au  Fact Sheet for Healthcare Providers: IncredibleEmployment.be  This test is not yet approved or cleared by the [place] FDA and has been authorized for detection and/or diagnosis of SARS-CoV-2 by FDA under an Emergency Use Authorization (EUA). This EUA will remain in effect (meaning this test can be used) for the duration of the COVID-19 declaration under Section 564(b)(1) of the Act, 21 U.S.C. section 360bbb-3(b)(1), unless the authorization is terminated or revoked.  Performed at [facility], [address] [place], [place] [address]     Radiology Reports DG Shoulder Right  Result Date: 10/31/2020 Formatting of this result is different from the  original. X-rays of the right shoulder were obtained in clinic today and demonstrates no acute injury.  No significant glenohumeral arthritis.  Mild degenerative changes noted at the [facility] joint, including some narrowing, as well as superior osteophytes.   Impression: Mild right AC joint arthritis  CT Abdomen Pelvis W Contrast  Result Date: 11/12/2020 CLINICAL DATA:  Abdominal pain with black stools for 3 weeks. EXAM: CT ABDOMEN AND PELVIS WITH CONTRAST TECHNIQUE: Multidetector CT imaging of the abdomen and pelvis was performed using the standard protocol following bolus administration of intravenous contrast. CONTRAST:  153mL OMNIPAQUE IOHEXOL 300 MG/ML  SOLN COMPARISON:  03/08/2019. FINDINGS: Lower chest: Clear lung bases. Hepatobiliary: Liver enlarged. No mass or focal lesion. Mildly distended gallbladder. Small densities noted in the dependent gallbladder consistent with stones. No wall thickening. No inflammation. No duct dilation. Pancreas: Unremarkable. No pancreatic ductal dilatation or surrounding inflammatory changes. Spleen: Spleen enlarged, 20 cm in greatest transverse dimension. No splenic mass or focal lesion. 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 No other adrenal masses. Kidneys normal in size, orientation and position with symmetric enhancement and excretion. No mass, stone or hydronephrosis. Normal ureters. Normal bladder. Stomach/Bowel: Normal stomach. Small bowel and colon are normal in caliber. No wall thickening. No inflammation. No evidence of appendicitis. Vascular/Lymphatic: Prominent and mildly enlarged upper abdominal lymph nodes. Peri celiac node measures 1.9 cm in short axis. Posterior gastrohepatic ligament lymph node measures 1.5 cm in short axis. Nodes are similar to the prior CT. Mild aortic atherosclerosis. No aneurysm. Reproductive: Unremarkable. Other: No abdominal wall hernia. No ascites. Nonspecific subcutaneous soft tissue edema noted along the  abdomen and flanks. No fluid collection to suggest an abscess. Musculoskeletal: No fracture or acute finding.  No bone lesion. IMPRESSION: 1. No acute findings within the abdomen or pelvis.  No abscess. 2. Hepato splenomegaly stable compared to the prior CT. 3. Prominent and mildly enlarged upper abdominal lymph nodes, presumed reactive, and essentially stable from the prior CT. 4. Mild aortic atherosclerosis. Electronically Signed   By: Lajean Manes M.D.   On: 11/12/2020 15:02

## 2020-11-14 NOTE — Progress Notes (Signed)
Patient has asked to have his pacemaker interrogated before he is discharged. Thank you.

## 2020-11-14 NOTE — Progress Notes (Signed)
RT set up patient CPAP at beside. Patient is able to place self on when he is ready.

## 2020-11-14 NOTE — Op Note (Signed)
[facility] Patient Name: Reginald [name] Procedure Date : 11/14/2020 MRN: [id number] Attending MD: Gatha Mayer , MD Date of Birth: [birth year] CSN: [id number] Age: 50 Admit Type: Inpatient Procedure:                Upper GI endoscopy Indications:              Iron deficiency anemia, Heme positive stool Providers:                Gatha Mayer, MD, Kary Kos RN, RN, Tyrone Apple, Technician, Clearnce Sorrel, CRNA Referring MD:              Medicines:                Propofol per Anesthesia, Monitored Anesthesia Care Complications:            No immediate complications. Estimated Blood Loss:     Estimated blood loss: none. Procedure:                Pre-Anesthesia Assessment:                           - Prior to the procedure, a History and Physical                            was performed, and patient medications and                            allergies were reviewed. [name] tolerance of                            previous anesthesia was also reviewed. The risks                            and benefits of the procedure and the sedation                            options and risks were discussed with the patient.                            All questions were answered, and informed consent                            was obtained. Prior Anticoagulants: The patient                            last took Xarelto (rivaroxaban) 4 days prior to the                            procedure. ASA Grade Assessment: IV - A patient                            with severe systemic disease that is a constant  threat to life. After reviewing the risks and                            benefits, the patient was deemed in satisfactory                            condition to undergo the procedure.                           After obtaining informed consent, the endoscope was                            passed under direct vision. Throughout the                             procedure, the patient's blood pressure, pulse, and                            oxygen saturations were monitored continuously. The                            [id number]-H190 ([id number]) Olympus gastroscope was                            introduced through the mouth, and advanced to the                            second part of duodenum. The upper GI endoscopy was                            accomplished without difficulty. The patient                            tolerated the procedure well. Scope In: Scope Out: Findings:      The esophagus was normal.      The stomach was normal.      The examined duodenum was normal.      The cardia and gastric fundus were normal on retroflexion. Impression:               - Normal esophagus.                           - Normal stomach.                           - Normal examined duodenum.                           - No specimens collected. Recommendation:           - See the other procedure note for documentation of                            additional recommendations. Procedure Code(s):        --- Professional ---  68864, Esophagogastroduodenoscopy, flexible,                            transoral; diagnostic, including collection of                            specimen(s) by brushing or washing, when performed                            (separate procedure) Diagnosis Code(s):        --- Professional ---                           D50.9, Iron deficiency anemia, unspecified                           R19.5, Other fecal abnormalities CPT copyright 2019 American Medical Association. All rights reserved. The codes documented in this report are preliminary and upon coder review may  be revised to meet current compliance requirements. Gatha Mayer, MD 11/14/2020 9:53:25 AM This report has been signed electronically. Number of Addenda: 0

## 2020-11-14 NOTE — Anesthesia Postprocedure Evaluation (Signed)
Anesthesia Post Note  Patient: Reginald [name]  Procedure(s) Performed: COLONOSCOPY WITH PROPOFOL ESOPHAGOGASTRODUODENOSCOPY (EGD) WITH PROPOFOL     Patient location during evaluation: PACU Anesthesia Type: MAC Level of consciousness: awake and alert Pain management: pain level controlled Vital Signs Assessment: post-procedure vital signs reviewed and stable Respiratory status: spontaneous breathing, nonlabored ventilation, respiratory function stable and patient connected to nasal cannula oxygen Cardiovascular status: stable and blood pressure returned to baseline Postop Assessment: no apparent nausea or vomiting Anesthetic complications: no   No notable events documented.  Last Vitals:  Vitals:   11/14/20 1014 11/14/20 1218  BP: 136/84 123/84  Pulse: 60 60  Resp: 20 18  Temp:  36.4 C  SpO2: 99% 95%    Last Pain:  Vitals:   11/14/20 1218  TempSrc: Oral  PainSc:                  Catalina Gravel

## 2020-11-14 NOTE — Transfer of Care (Signed)
Immediate Anesthesia Transfer of Care Note  Patient: Reginald [name]  Procedure(s) Performed: COLONOSCOPY WITH PROPOFOL ESOPHAGOGASTRODUODENOSCOPY (EGD) WITH PROPOFOL  Patient Location: PACU  Anesthesia Type:MAC  Level of Consciousness: awake, alert  and oriented  Airway & Oxygen Therapy: Patient Spontanous Breathing and Patient connected to nasal cannula oxygen  Post-op Assessment: Report given to RN and Post -op Vital signs reviewed and stable  Post vital signs: Reviewed and stable  Last Vitals:  Vitals Value Taken Time  BP    Temp    Pulse    Resp    SpO2      Last Pain:  Vitals:   11/14/20 0845  TempSrc: Oral  PainSc: 7          Complications: No notable events documented.

## 2020-11-14 NOTE — Op Note (Addendum)
[facility] Patient Name: Reginald [name] Procedure Date : 11/14/2020 MRN: [id number] Attending MD: Gatha Mayer , MD Date of Birth: [birth year] CSN: [id number] Age: 50 Admit Type: Inpatient Procedure:                Colonoscopy Indications:              Heme positive stool, Rectal bleeding Providers:                Gatha Mayer, MD, Glori Bickers, RN, Tyrone Apple, Technician, Clearnce Sorrel, CRNA Referring MD:              Medicines:                Propofol per Anesthesia Complications:            No immediate complications. Estimated Blood Loss:     Estimated blood loss: none. Procedure:                Pre-Anesthesia Assessment:                           - Prior to the procedure, a History and Physical                            was performed, and patient medications and                            allergies were reviewed. [name] tolerance of                            previous anesthesia was also reviewed. The risks                            and benefits of the procedure and the sedation                            options and risks were discussed with the patient.                            All questions were answered, and informed consent                            was obtained. Prior Anticoagulants: The patient                            last took Xarelto (rivaroxaban) 4 days prior to the                            procedure. ASA Grade Assessment: IV - A patient                            with severe systemic disease that is a constant  threat to life. After reviewing the risks and                            benefits, the patient was deemed in satisfactory                            condition to undergo the procedure.                           After obtaining informed consent, the colonoscope                            was passed under direct vision. Throughout the                            procedure,  the patient's blood pressure, pulse, and                            oxygen saturations were monitored continuously. The                            CF-HQ190L ([id number]) Olympus colonoscope was                            introduced through the anus and advanced to the the                            cecum, identified by appendiceal orifice and                            ileocecal valve. The colonoscopy was performed                            without difficulty. The patient tolerated the                            procedure well. The quality of the bowel                            preparation was good. The ileocecal valve,                            appendiceal orifice, and rectum were photographed. Scope In: 9:26:58 AM Scope Out: 9:43:04 AM Scope Withdrawal Time: 0 hours 10 minutes 22 seconds  Total Procedure Duration: 0 hours 16 minutes 6 seconds  Findings:      The perianal and digital rectal examinations were normal.      External and internal hemorrhoids were found.      The exam was otherwise without abnormality on direct and retroflexion       views. Impression:               - External and internal hemorrhoids.                           - The examination was  otherwise normal on direct                            and retroflexion views.                           - No specimens collected. Recommendation:           - Return patient to hospital ward for possible                            discharge same day.                           - PRN HC cream for hemorrhoids                           I think dark stools were ferrous sulfate - he can                            restart iron supp - stools were loose - ? from that                            so try ferrous fumarate or gluconate- ferritin is                            88 but iron sat etx suggests possible deficiency                            though I also suspect a component of chronic disease                           Restart Xarelto  now is fine                           If he has persistent, troublesome rectal bleeding                            consider hemorrhoid banding                           He is ready to dc from GI standpoint                           He does not need a GI appt set up now                           He should f/u PCP Procedure Code(s):        --- Professional ---                           [phone], Colonoscopy, flexible; diagnostic, including                            collection of specimen(s) by brushing or washing,  when performed (separate procedure) Diagnosis Code(s):        --- Professional ---                           K64.8, Other hemorrhoids                           R19.5, Other fecal abnormalities                           K62.5, Hemorrhage of anus and rectum CPT copyright 2019 American Medical Association. All rights reserved. The codes documented in this report are preliminary and upon coder review may  be revised to meet current compliance requirements. Gatha Mayer, MD 11/14/2020 9:57:42 AM This report has been signed electronically. Number of Addenda: 0

## 2020-11-15 ENCOUNTER — Encounter (HOSPITAL_COMMUNITY): Payer: Self-pay | Admitting: Internal Medicine

## 2020-11-15 ENCOUNTER — Other Ambulatory Visit: Payer: Self-pay

## 2020-11-15 LAB — COMPREHENSIVE METABOLIC PANEL
ALT: 23 U/L (ref 0–44)
AST: 32 U/L (ref 15–41)
Albumin: 3.6 g/dL (ref 3.5–5.0)
Alkaline Phosphatase: 76 U/L (ref 38–126)
Anion gap: 10 (ref 5–15)
BUN: 16 mg/dL (ref 6–20)
CO2: 27 mmol/L (ref 22–32)
Calcium: 8.9 mg/dL (ref 8.9–10.3)
Chloride: 99 mmol/L (ref 98–111)
Creatinine, Ser: 1.25 mg/dL — ABNORMAL HIGH (ref 0.61–1.24)
GFR, Estimated: >60 mL/min (ref >60)
Glucose, Bld: 111 mg/dL — ABNORMAL HIGH (ref 70–99)
Potassium: 3.7 mmol/L (ref 3.5–5.1)
Sodium: 136 mmol/L (ref 135–145)
Total Bilirubin: 1.4 mg/dL — ABNORMAL HIGH (ref 0.3–1.2)
Total Protein: 8 g/dL (ref 6.5–8.1)

## 2020-11-15 LAB — CBC WITH DIFFERENTIAL/PLATELET
Abs Immature Granulocytes: 0.06 10*3/uL (ref 0.00–0.07)
Basophils Absolute: 0 10*3/uL (ref 0.0–0.1)
Basophils Relative: 0 %
Eosinophils Absolute: 0.3 10*3/uL (ref 0.0–0.5)
Eosinophils Relative: 3 %
HCT: 35.1 % — ABNORMAL LOW (ref 39.0–52.0)
Hemoglobin: 10.4 g/dL — ABNORMAL LOW (ref 13.0–17.0)
Immature Granulocytes: 1 %
Lymphocytes Relative: 16 %
Lymphs Abs: 1.8 10*3/uL (ref 0.7–4.0)
MCH: 24.4 pg — ABNORMAL LOW (ref 26.0–34.0)
MCHC: 29.6 g/dL — ABNORMAL LOW (ref 30.0–36.0)
MCV: 82.4 fL (ref 80.0–100.0)
Monocytes Absolute: 0.6 10*3/uL (ref 0.1–1.0)
Monocytes Relative: 5 %
Neutro Abs: 8.2 10*3/uL — ABNORMAL HIGH (ref 1.7–7.7)
Neutrophils Relative %: 75 %
Platelets: 240 10*3/uL (ref 150–400)
RBC: 4.26 MIL/uL (ref 4.22–5.81)
RDW: 17.9 % — ABNORMAL HIGH (ref 11.5–15.5)
WBC: 10.9 10*3/uL — ABNORMAL HIGH (ref 4.0–10.5)
nRBC: 0 % (ref 0.0–0.2)

## 2020-11-15 LAB — GLUCOSE, CAPILLARY
Glucose-Capillary: 98 mg/dL (ref 70–99)
Glucose-Capillary: 105 mg/dL — ABNORMAL HIGH (ref 70–99)

## 2020-11-15 LAB — MAGNESIUM: Magnesium: 2.2 mg/dL (ref 1.7–2.4)

## 2020-11-15 MED ORDER — RIVAROXABAN 20 MG PO TABS
20.0000 mg | ORAL_TABLET | Freq: Every day | ORAL | Status: DC
  Administered 2020-11-15: 20 mg via ORAL
  Filled 2020-11-15: qty 1

## 2020-11-15 MED ORDER — GABAPENTIN 100 MG PO CAPS: ORAL_CAPSULE | ORAL | 0 refills | Status: DC

## 2020-11-15 MED ORDER — DOCUSATE SODIUM 100 MG PO CAPS: 200.0000 mg | ORAL_CAPSULE | Freq: Two times a day (BID) | ORAL | 0 refills | Status: AC

## 2020-11-15 MED ORDER — POLYETHYLENE GLYCOL 3350 17 G PO PACK: 17.0000 g | PACK | Freq: Every day | ORAL | 0 refills | Status: DC | PRN

## 2020-11-15 NOTE — Consult Note (Signed)
   [facility] CM Inpatient Consult   11/15/2020  Reginald [name] July 13, 1970 [id number]  Referral from Inpatient [facility] RNCM:  We have reviewed your referral request and requested assignment for post hospital follow up.   [facility] [ACO] Patient: Reginald [name]  Patient evaluated for community based chronic complex disease management services with [place] [facility] as a benefit of patient's [employer]. Spoke with  HIPAA verified, to explain [facility] services for post hospital care needs.  Explained that the patient qualifies for complex disease management as a benefit.   Patient endorses Dr. Ihor Dow at [facility] as his primary care provider, this is an Embedded Primary Care provider.   Plan: Refer patient for the Embedded Chronic Care Management program.  Patient verbalized understanding.    Of note, [facility] Care Management services does not replace or interfere with any services that are arranged by inpatient case management or social work.  For additional questions or referrals please contact:    Natividad Brood, RN BSN [facility] Liaison  [phone] business mobile phone Toll free office [phone]  Fax number: [phone] [email or web address]

## 2020-11-15 NOTE — TOC Transition Note (Signed)
Transition of Care [facility]) - CM/SW Discharge Note   Patient Details  Name: Reginald [name] MRN: [id number] Date of Birth: [birth year]  [facility] CM/SW Contact:  Ninfa Meeker, RN Phone Number: 11/15/2020, 9:49 AM   Clinical Narrative:   Patient has been accepted by [facility], CM confirmed with Adela Lank this morning. MD to enter Face to Face for Home Health services.     Final next level of care: Home w Home Health Services Barriers to Discharge: No Barriers Identified   Patient Goals and CMS Choice     Choice offered to / list presented to : Patient  Discharge Placement                       Discharge Plan and Services   Discharge Planning Services: CM Consult Post Acute Care Choice: Home Health            DME Agency: NA       HH Arranged: PT, Social Work [employer] Agency: [place] Date [place]: 11/13/20 Time [place]: [place] Representative spoke with at [place]: Tommi Rumps  Social Determinants of Health ([place]) Interventions     Readmission Risk Interventions No flowsheet data found.

## 2020-11-15 NOTE — Telephone Encounter (Signed)
This encounter was created in error - please disregard.

## 2020-11-15 NOTE — Progress Notes (Signed)
ANTICOAGULATION CONSULT NOTE - Initial Consult  Pharmacy Consult for Xarelto Indication: atrial fibrillation and pulmonary embolus  No Known Allergies  Patient Measurements: Height: _0  (180.3 cm) Weight: (!) 221.8 kg (489 lb) IBW/kg (Calculated) : 75.3 Heparin Dosing Weight:   Vital Signs: Temp: 98 F (36.7 C) (06/27 0812) Temp Source: Oral (06/27 0812) BP: 141/66 (06/27 0812) Pulse Rate: 60 (06/27 0812)  Labs: Recent Labs    11/12/20 1132 11/12/20 2239 11/13/20 0128 11/14/20 0052 11/14/20 1145 11/15/20 0117  HGB 10.2*   < > 10.2*  --  10.7* 10.4*  HCT 34.2*   < > 33.7*  --  36.1* 35.1*  PLT 257   < > 245  --  245 240  LABPROT 19.4*  --   --   --   --   --   INR 1.6*  --   --   --   --   --   CREATININE 1.26*  --  1.33* 1.37*  --  1.25*   < > = values in this interval not displayed.    Estimated Creatinine Clearance: 135.4 mL/min (A) (by C-G formula based on SCr of 1.25 mg/dL (H)).   Medical History: Past Medical History:  Diagnosis Date   Anxiety    Aortic stenosis    Arthritis    Asthma    Back pain    Cellulitis of skin with lymphangitis    CHF (congestive heart failure) (HCC)    Chronic diastolic congestive heart failure (HCC)    Chronic venous insufficiency    Constipation    COPD (chronic obstructive pulmonary disease) (HCC)    Depression    Diabetes ([place])    Dyspnea    Essential hypertension    Gout    Heart murmur    Hypertension    Morbid obesity ([place])    Obesity    Pneumonia    walking pneumonia   Prostatitis    Pulmonary embolism ([place])    Pulmonary hypertension (HCC)    Pyelonephritis    S/P aortic valve replacement with bioprosthetic valve 08/23/2017   25 mm Edwards Inspiris Resilia stented bovine pericardial tissue valve   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      albuterol (PROVENTIL) (2.5 MG/3ML) 0.083% nebulizer solution INHALE 1 VIAL VIA NEBULIZER EVERY 6 HOURS AS NEEDED FOR WHEEZING OR SHORTNESS OF BREATH 270 mL 1    albuterol (VENTOLIN HFA) 108 (90 Base) MCG/ACT inhaler Inhale 2 puffs into the lungs every 6 (six) hours as needed for wheezing or shortness of breath. 8 g 3    allopurinol (ZYLOPRIM) 300 MG tablet TAKE 1 TABLET EVERY DAY 90 tablet 0 11/12/2020   ammonium lactate (AMLACTIN) 12 % lotion Apply 1 application topically as needed for dry skin. 400 g 0    atorvastatin (LIPITOR) 40 MG tablet TAKE 1 TABLET EVERY DAY 90 tablet 0 11/11/2020   Blood Glucose Monitoring Suppl (TRUE METRIX METER) w/Device KIT USE AS DIRECTED 1 kit 0    busPIRone (BUSPAR) 10 MG tablet TAKE 1 TABLET THREE  TIMES DAILY 270 tablet 0 11/12/2020   citalopram (CELEXA) 40 MG tablet TAKE 1 TABLET EVERY DAY 90 tablet 0 11/12/2020   diclofenac Sodium (VOLTAREN) 1 % GEL Apply 2 g topically 4 (four) times daily. 150 g 1    fexofenadine (ALLEGRA) 180 MG tablet Take 180 mg by mouth daily.   11/11/2020   gabapentin (NEURONTIN) 100 MG capsule Take 1 capsule in the morning and 2 capsules in the evening. 270 capsule 1 11/12/2020   glipiZIDE (GLUCOTROL XL) 5 MG 24 hr tablet Take 1 tablet (5 mg total) by mouth daily with breakfast. 30 tablet 5 11/12/2020   Iron, Ferrous Sulfate, 325 (65 Fe) MG TABS Take 325 mg by mouth daily. 90 tablet 1 11/12/2020   Krill Oil 350 MG CAPS Take 1 capsule by mouth in the morning and at bedtime.    11/12/2020   lisinopril (ZESTRIL) 20 MG tablet Take 1 tablet (20 mg total) by mouth daily. 90 tablet 3 11/12/2020   loratadine (CLARITIN) 10 MG tablet Take 10 mg by mouth daily as needed for allergies.      metFORMIN (GLUCOPHAGE) 850 MG tablet Take 1 tablet  (850 mg total) by mouth 2 (two) times daily with a meal. 180 tablet 1 11/12/2020   metolazone (ZAROXOLYN) 5 MG tablet PRN (Patient taking differently: Take 5 mg by mouth daily as needed (swelling). PRN) 90 tablet 3 11/11/2020   metoprolol tartrate (LOPRESSOR) 100 MG tablet Take 1 tablet (100 mg total) by mouth 2 (two) times daily. 180 tablet 3 11/12/2020 at 0800   mupirocin ointment (BACTROBAN) 2 % Apply 1 application topically 2 (two) times daily. 22 g 0    pantoprazole (PROTONIX) 40 MG tablet Take 1 tablet (40 mg total) by mouth daily. 90 tablet 3 11/12/2020   potassium chloride SA (KLOR-CON) 20 MEQ tablet TAKE 3 TABS DAILY AND TAKE AN ADDITIONAL 1 TAB ON THE DAY YOU TAKE YOUR WEEKLY METOLAZONE DOSE 283 tablet 3 11/12/2020   rivaroxaban (XARELTO) 20 MG TABS tablet TAKE 1 Tablet BY MOUTH ONCE EVERY DAY WITH SUPPER 90 tablet 3 11/11/2020 at 2000   terbinafine (LAMISIL) 250 MG tablet Take 1 tablet (250 mg total) by mouth daily. 28 tablet 2 11/12/2020   torsemide (DEMADEX) 20 MG tablet Take 2 tablets (40 mg total) by mouth 2 (two) times daily. 360 tablet 3 11/12/2020   TRUE METRIX BLOOD GLUCOSE TEST test strip TEST ONE TIME DAILY FOR DIABETES 100 strip 5    TRUEplus Lancets 33G MISC USE TO TEST ONE TIME DAILY FOR DIABETES 100 each 5    docusate sodium (COLACE) 100 MG capsule Take 200 mg by mouth daily as needed for mild constipation. (Patient not taking: No sig reported)   Not Taking   guaiFENesin (MUCINEX) 600 MG 12 hr tablet Take 600 mg by mouth 2 (two) times daily. (Patient not taking: Reported on 11/12/2020)   Not Taking   Scheduled:   allopurinol  300 mg Oral Daily   atorvastatin  40 mg Oral Daily   busPIRone  10 mg Oral TID   citalopram  40 mg Oral Daily   insulin aspart  0-15 Units Subcutaneous TID WC   insulin aspart  0-5 Units Subcutaneous QHS   metoprolol tartrate  100 mg Oral BID   pantoprazole  40 mg Oral BID   torsemide  40 mg Oral Daily    Assessment: Pt was admitted for abd pain. He  underwent colonoscopy and was found to have internal hemorrhoids. Plan is  to resume his home Xarelto today.  Hgb 10.4, plt wnl Crcl>50  Goal of Therapy:   Monitor platelets by anticoagulation protocol: Yes   Plan:  Resume Xarelto 26m PO qSupper Rx will follow peripherally  MOnnie Boer PharmD, BCIDP, AAHIVP, CPP Infectious Disease Pharmacist 11/15/2020 8:36 AM

## 2020-11-15 NOTE — Discharge Summary (Signed)
Reginald [name] PQD:[id number] DOB: 1970-11-24 DOA: 11/12/2020  PCP: [name], MD  Admit date: 11/12/2020  Discharge date: 11/15/2020  Admitted From: Home   Disposition:  Home   Recommendations for Outpatient Follow-up:   Follow up with PCP in 1-2 weeks  PCP Please obtain BMP/CBC, 2 view CXR in 1week,  (see Discharge instructions)   PCP Please follow up on the following pending results: Review CT scan report in detail, monitor CBC and glycemic control closely   Home Health: PT,RN   Equipment/Devices: None  Consultations: GI Discharge Condition: Stable    CODE STATUS: Full    Diet Recommendation: Heart Healthy Low Carb  Chief Complaint  Patient presents with   Abdominal Pain     Brief history of present illness from the day of admission and additional interim summary    Reginald [name]  is a 50 y.o. male,  Hypertension, Pre-diabetes, CHF (diastolic), Aortic stenosis s/p AVR, AAA, Copd, pulmonary hypertension, OSA, A. fib on anticoagulation, PE , COPD, patient with history of anemia, where he was started on oral iron supplements 3 to 4 weeks ago, patient reports black, liquid stools for last 3 weeks, as well he does report intermittent bright red blood per rectum for the last couple weeks, he presented to [facility] and from there he was sent to [facility] for further evaluation.  H&H is stable.                                                                   Hospital Course     Black stools and some bright red blood per rectum with lower GI bleed - his H&H was essentially unchanged, has black stools most likely were due to over an iron supplement and intermittent red blood per rectum were due to hemorrhoids, he underwent EGD and colonoscopy which were both again unremarkable except for hemorrhoids, he  was seen by [facility], currently placed on stool softener, continue oral iron supplementation and follow-up with PCP and primary gastroenterologist closely post discharge.  He is currently symptom-free.   2.  Morbid obesity with OSA and fatty liver.  Follow with PCP for weight loss CPAP nightly.   3.  Chronic diastolic CHF EF 94% on last echocardiogram.  On beta-blocker, low-dose torsemide to continue, monitor.   4.  CAD s/p CABG.  Continue beta-blocker, statin can [place] prevention.  Resume home dose Xarelto.   5.  Paroxysmal A. fib.  Mali vas 2 score of greater than 3.  On beta-blocker.  Continue Xarelto.   6.  History of complete heart block.  Has pacemaker.  Will follow with EP at home.   7.  History of gout.  Stable on allopurinol.     8. DM type II.  Continue home regimen  and follow with PCP for glycemic control.  Lab Results  Component Value Date   HGBA1C 5.4 11/12/2020     9.  Specific reactive abdominal lymphadenopathy, stable as compared to previous CT.  PCP to follow and monitor closely.   Discharge diagnosis     Active Problems:   Essential hypertension   Chronic heart failure with preserved ejection fraction (HFpEF) (HCC)   COPD (chronic obstructive pulmonary disease) (HCC)   Anxiety   DM2 (diabetes mellitus, type 2) (HCC)   GI bleed   Anemia   Rectal bleeding   Heme positive stool   Anticoagulated    Discharge instructions    Discharge Instructions     Discharge instructions   Complete by: As directed    Follow with Primary MD [name], MD in 7 days   Get CBC, CMP -  checked next visit within 1 week by Primary MD   Activity: As tolerated with Full fall precautions use walker/cane & assistance as needed  Disposition Home   Diet: Heart Healthy Low Carb  Accuchecks 4 times/day, Once in AM empty stomach and then before each meal. Log in all results and show them to your Prim.MD in 3 days. If any glucose reading is under 80 or above 300 call  your Prim MD immidiately. Follow Low glucose instructions for glucose under 80 as instructed.   Special Instructions: If you have smoked or chewed Tobacco  in the last 2 yrs please stop smoking, stop any regular Alcohol  and or any Recreational drug use.  On your next visit with your primary care physician please Get Medicines reviewed and adjusted.  Please request your Prim.MD to go over all Hospital Tests and Procedure/Radiological results at the follow up, please get all Hospital records sent to your Prim MD by signing hospital release before you go home.  If you experience worsening of your admission symptoms, develop shortness of breath, life threatening emergency, suicidal or homicidal thoughts you must seek medical attention immediately by calling 911 or calling your MD immediately  if symptoms less severe.  You Must read complete instructions/literature along with all the possible adverse reactions/side effects for all the Medicines you take and that have been prescribed to you. Take any new Medicines after you have completely understood and accpet all the possible adverse reactions/side effects.   Increase activity slowly   Complete by: As directed        Discharge Medications   Allergies as of 11/15/2020   No Known Allergies      Medication List     STOP taking these medications    guaiFENesin 600 MG 12 hr tablet Commonly known as: MUCINEX       TAKE these medications    acetaminophen 325 MG tablet Commonly known as: TYLENOL Take 650 mg by mouth every 6 (six) hours as needed for mild pain.   albuterol 108 (90 Base) MCG/ACT inhaler Commonly known as: VENTOLIN HFA Inhale 2 puffs into the lungs every 6 (six) hours as needed for wheezing or shortness of breath.   albuterol (2.5 MG/3ML) 0.083% nebulizer solution Commonly known as: PROVENTIL INHALE 1 VIAL VIA NEBULIZER EVERY 6 HOURS AS NEEDED FOR WHEEZING OR SHORTNESS OF BREATH   allopurinol 300 MG  tablet Commonly known as: ZYLOPRIM TAKE 1 TABLET EVERY DAY   ammonium lactate 12 % lotion Commonly known as: AmLactin Apply 1 application topically as needed for dry skin.   atorvastatin 40 MG tablet Commonly known as: LIPITOR TAKE  1 TABLET EVERY DAY   busPIRone 10 MG tablet Commonly known as: BUSPAR TAKE 1 TABLET THREE TIMES DAILY   citalopram 40 MG tablet Commonly known as: CELEXA TAKE 1 TABLET EVERY DAY   diclofenac Sodium 1 % Gel Commonly known as: Voltaren Apply 2 g topically 4 (four) times daily.   docusate sodium 100 MG capsule Commonly known as: COLACE Take 2 capsules (200 mg total) by mouth 2 (two) times daily. What changed:  when to take this reasons to take this   fexofenadine 180 MG tablet Commonly known as: ALLEGRA Take 180 mg by mouth daily.   gabapentin 100 MG capsule Commonly known as: NEURONTIN Take 1 capsule in the morning and 2 capsules in the evening.   glipiZIDE 5 MG 24 hr tablet Commonly known as: GLUCOTROL XL Take 1 tablet (5 mg total) by mouth daily with breakfast.   Iron (Ferrous Sulfate) 325 (65 Fe) MG Tabs Take 325 mg by mouth daily.   Krill Oil 350 MG Caps Take 1 capsule by mouth in the morning and at bedtime.   lisinopril 20 MG tablet Commonly known as: ZESTRIL Take 1 tablet (20 mg total) by mouth daily.   loratadine 10 MG tablet Commonly known as: CLARITIN Take 10 mg by mouth daily as needed for allergies.   metFORMIN 850 MG tablet Commonly known as: GLUCOPHAGE Take 1 tablet (850 mg total) by mouth 2 (two) times daily with a meal.   metolazone 5 MG tablet Commonly known as: ZAROXOLYN PRN What changed:  how much to take how to take this when to take this reasons to take this   metoprolol tartrate 100 MG tablet Commonly known as: LOPRESSOR Take 1 tablet (100 mg total) by mouth 2 (two) times daily.   mupirocin ointment 2 % Commonly known as: BACTROBAN Apply 1 application topically 2 (two) times daily.    pantoprazole 40 MG tablet Commonly known as: PROTONIX Take 1 tablet (40 mg total) by mouth daily.   polyethylene glycol 17 g packet Commonly known as: MiraLax Take 17 g by mouth daily as needed.   potassium chloride SA 20 MEQ tablet Commonly known as: KLOR-CON TAKE 3 TABS DAILY AND TAKE AN ADDITIONAL 1 TAB ON THE DAY YOU TAKE YOUR WEEKLY METOLAZONE DOSE   rivaroxaban 20 MG Tabs tablet Commonly known as: Xarelto TAKE 1 Tablet BY MOUTH ONCE EVERY DAY WITH SUPPER   terbinafine 250 MG tablet Commonly known as: LAMISIL Take 1 tablet (250 mg total) by mouth daily.   torsemide 20 MG tablet Commonly known as: DEMADEX Take 2 tablets (40 mg total) by mouth 2 (two) times daily.   True Metrix Blood Glucose Test test strip Generic drug: glucose blood TEST ONE TIME DAILY FOR DIABETES   True Metrix Meter w/Device Kit USE AS DIRECTED   TRUEplus Lancets 33G Misc USE TO TEST ONE TIME DAILY FOR DIABETES         Follow-up Information     [facility] Follow up.   Specialty: Home Health Services Why: Someone from [facility] will contact you to arrange start date and time for your therapy Contact information: [place] [address] [phone]         [name], MD. Schedule an appointment as soon as possible for a visit in 1 week(s).   Specialty: Internal Medicine Contact information: [address] [place] [address] [phone]         Thompson Grayer, MD .  Specialty: Cardiology Contact information: [place] [address] [phone]         Josue Hector, MD .   Specialty: Cardiology Contact information: [phone] [address] [place] [address] [phone]                 Major procedures and Radiology Reports - PLEASE review detailed and final reports thoroughly  -       DG Shoulder Right  Result Date: 10/31/2020 Formatting of this result is  different from the original. X-rays of the right shoulder were obtained in clinic today and demonstrates no acute injury.  No significant glenohumeral arthritis.  Mild degenerative changes noted at the [facility] joint, including some narrowing, as well as superior osteophytes.   Impression: Mild right AC joint arthritis  CT Abdomen Pelvis W Contrast  Result Date: 11/12/2020 CLINICAL DATA:  Abdominal pain with black stools for 3 weeks. EXAM: CT ABDOMEN AND PELVIS WITH CONTRAST TECHNIQUE: Multidetector CT imaging of the abdomen and pelvis was performed using the standard protocol following bolus administration of intravenous contrast. CONTRAST:  191m OMNIPAQUE IOHEXOL 300 MG/ML  SOLN COMPARISON:  03/08/2019. FINDINGS: Lower chest: Clear lung bases. Hepatobiliary: Liver enlarged. No mass or focal lesion. Mildly distended gallbladder. Small densities noted in the dependent gallbladder consistent with stones. No wall thickening. No inflammation. No duct dilation. Pancreas: Unremarkable. No pancreatic ductal dilatation or surrounding inflammatory changes. Spleen: Spleen enlarged, 20 cm in greatest transverse dimension. No splenic mass or focal lesion. 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 No other adrenal masses. Kidneys normal in size, orientation and position with symmetric enhancement and excretion. No mass, stone or hydronephrosis. Normal ureters. Normal bladder. Stomach/Bowel: Normal stomach. Small bowel and colon are normal in caliber. No wall thickening. No inflammation. No evidence of appendicitis. Vascular/Lymphatic: Prominent and mildly enlarged upper abdominal lymph nodes. Peri celiac node measures 1.9 cm in short axis. Posterior gastrohepatic ligament lymph node measures 1.5 cm in short axis. Nodes are similar to the prior CT. Mild aortic atherosclerosis. No aneurysm. Reproductive: Unremarkable. Other: No abdominal wall hernia. No ascites. Nonspecific subcutaneous soft tissue edema  noted along the abdomen and flanks. No fluid collection to suggest an abscess. Musculoskeletal: No fracture or acute finding.  No bone lesion. IMPRESSION: 1. No acute findings within the abdomen or pelvis.  No abscess. 2. Hepato splenomegaly stable compared to the prior CT. 3. Prominent and mildly enlarged upper abdominal lymph nodes, presumed reactive, and essentially stable from the prior CT. 4. Mild aortic atherosclerosis. Electronically Signed   By: DLajean ManesM.D.   On: 11/12/2020 15:02    Micro Results    Recent Results (from the past 240 hour(s))  Resp Panel by RT-PCR (Flu A&B, Covid) Nasopharyngeal Swab     Status: None   Collection Time: 11/12/20  4:25 PM   Specimen: Nasopharyngeal Swab; Nasopharyngeal(NP) swabs in vial transport medium  Result Value Ref Range Status   SARS Coronavirus 2 by RT PCR NEGATIVE NEGATIVE Final    Comment: (NOTE) SARS-CoV-2 target nucleic acids are NOT DETECTED.  The SARS-CoV-2 RNA is generally detectable in upper respiratory specimens during the acute phase of infection. The lowest concentration of SARS-CoV-2 viral copies this assay can detect is 138 copies/mL. A negative result does not preclude SARS-Cov-2 infection and should not be used as the sole basis for treatment or other patient management decisions. A negative result may occur with  improper specimen collection/handling, submission of specimen  other than nasopharyngeal swab, presence of viral mutation(s) within the areas targeted by this assay, and inadequate number of viral copies(<138 copies/mL). A negative result must be combined with clinical observations, patient history, and epidemiological information. The expected result is Negative.  Fact Sheet for Patients:  EntrepreneurPulse.com.au  Fact Sheet for Healthcare Providers:  IncredibleEmployment.be  This test is no t yet approved or cleared by the [place] FDA and  has been authorized for  detection and/or diagnosis of SARS-CoV-2 by FDA under an Emergency Use Authorization (EUA). This EUA will remain  in effect (meaning this test can be used) for the duration of the COVID-19 declaration under Section 564(b)(1) of the Act, 21 U.S.C.section 360bbb-3(b)(1), unless the authorization is terminated  or revoked sooner.       Influenza A by PCR NEGATIVE NEGATIVE Final   Influenza B by PCR NEGATIVE NEGATIVE Final    Comment: (NOTE) The Xpert Xpress SARS-CoV-2/FLU/RSV plus assay is intended as an aid in the diagnosis of influenza from Nasopharyngeal swab specimens and should not be used as a sole basis for treatment. Nasal washings and aspirates are unacceptable for Xpert Xpress SARS-CoV-2/FLU/RSV testing.  Fact Sheet for Patients: EntrepreneurPulse.com.au  Fact Sheet for Healthcare Providers: IncredibleEmployment.be  This test is not yet approved or cleared by the [place] FDA and has been authorized for detection and/or diagnosis of SARS-CoV-2 by FDA under an Emergency Use Authorization (EUA). This EUA will remain in effect (meaning this test can be used) for the duration of the COVID-19 declaration under Section 564(b)(1) of the Act, 21 U.S.C. section 360bbb-3(b)(1), unless the authorization is terminated or revoked.  Performed at [facility], [address] [place], [place] [address]     Today   Subjective    Hagop Mccollam today has no headache,no chest abdominal pain,no new weakness tingling or numbness, feels much better wants to go home today.   Objective   Blood pressure (!) 141/66, pulse 60, temperature 98 F (36.7 C), temperature source Oral, resp. rate 17, height _0  (1.803 m), weight (!) 221.8 kg, SpO2 98 %.   Intake/Output Summary (Last 24 hours) at 11/15/2020 0952 Last data filed at 11/15/2020 0413 Gross per 24 hour  Intake 914.83 ml  Output 1700 ml  Net -785.17 ml    Exam  Awake Alert, No new F.N  deficits, Normal affect Venturia.AT,PERRAL Supple Neck,No JVD, No cervical lymphadenopathy appriciated.  Symmetrical Chest wall movement, Good air movement bilaterally, CTAB RRR,No Gallops,Rubs or new Murmurs, No Parasternal Heave +ve B.Sounds, Abd Soft, Non tender, No organomegaly appriciated, No rebound -guarding or rigidity. No Cyanosis, Clubbing or edema, No new Rash or bruise   Data Review   CBC w Diff:  Lab Results  Component Value Date   WBC 10.9 (H) 11/15/2020   HGB 10.4 (L) 11/15/2020   HGB 10.4 (L) 10/04/2020   HCT 35.1 (L) 11/15/2020   HCT 33.7 (L) 10/04/2020   PLT 240 11/15/2020   PLT 247 10/04/2020   LYMPHOPCT 16 11/15/2020   MONOPCT 5 11/15/2020   EOSPCT 3 11/15/2020   BASOPCT 0 11/15/2020    CMP:  Lab Results  Component Value Date   NA 136 11/15/2020   NA 139 10/04/2020   K 3.7 11/15/2020   CL 99 11/15/2020   CO2 27 11/15/2020   BUN 16 11/15/2020   BUN 35 (H) 10/04/2020   CREATININE 1.25 (H) 11/15/2020   CREATININE 0.95 07/05/2017   PROT 8.0 11/15/2020   PROT 7.2 10/04/2020  ALBUMIN 3.6 11/15/2020   ALBUMIN 4.0 10/04/2020   BILITOT 1.4 (H) 11/15/2020   BILITOT 0.6 10/04/2020   ALKPHOS 76 11/15/2020   AST 32 11/15/2020   ALT 23 11/15/2020  .   Total Time in preparing paper work, data evaluation and todays exam - 67 minutes  Lala Lund M.D on 11/15/2020 at 9:52 AM  [facility]

## 2020-11-15 NOTE — Addendum Note (Signed)
Addended by: Oswaldo Done on: 6/48/4720 09:26 AM   Modules accepted: Orders, Level of Service, SmartSet

## 2020-11-16 ENCOUNTER — Telehealth: Payer: Self-pay | Admitting: *Deleted

## 2020-11-16 NOTE — Telephone Encounter (Signed)
Transition Care Management Follow-up Telephone Call Date of discharge and from where: 11-16-20 Reginald [name]  How have you been since you were released from the hospital? About the same feels weak Any questions or concerns? Yes would like to go over meds and heart rate   Items Reviewed: Did the pt receive and understand the discharge instructions provided? Yes  Medications obtained and verified? Yes  Other? No  Any new allergies since your discharge? No  Dietary orders reviewed? Yes Do you have support at home? No   Home Care and Equipment/Supplies: Were home health services ordered? no If so, what is the name of the agency? NA  Has the agency set up a time to come to the patient's home? not applicable Were any new equipment or medical supplies ordered?  No What is the name of the medical supply agency? NA Were you able to get the supplies/equipment? not applicable Do you have any questions related to the use of the equipment or supplies? No  Functional Questionnaire: (I = Independent and D = Dependent) ADLs: I  Bathing/Dressing- I  Meal Prep- I  Eating- I  Maintaining continence- I  Transferring/Ambulation- I  Managing Meds- I  Follow up appointments reviewed:  PCP Hospital f/u appt confirmed? Yes  Scheduled to see Dr Posey Pronto on 11-26-20 @ 8:00. [facility] f/u appt confirmed? No Scheduled to see NA . Are transportation arrangements needed? No  If their condition worsens, is the pt aware to call PCP or go to the Emergency Dept.? Yes Was the patient provided with contact information for the PCP's office or ED? Yes Was to pt encouraged to call back with questions or concerns? Yes

## 2020-11-17 DIAGNOSIS — K625 Hemorrhage of anus and rectum: Secondary | ICD-10-CM | POA: Diagnosis not present

## 2020-11-17 DIAGNOSIS — I872 Venous insufficiency (chronic) (peripheral): Secondary | ICD-10-CM | POA: Diagnosis not present

## 2020-11-17 DIAGNOSIS — I2510 Atherosclerotic heart disease of native coronary artery without angina pectoris: Secondary | ICD-10-CM | POA: Diagnosis not present

## 2020-11-17 DIAGNOSIS — I110 Hypertensive heart disease with heart failure: Secondary | ICD-10-CM | POA: Diagnosis not present

## 2020-11-17 DIAGNOSIS — I480 Paroxysmal atrial fibrillation: Secondary | ICD-10-CM | POA: Diagnosis not present

## 2020-11-17 DIAGNOSIS — I5032 Chronic diastolic (congestive) heart failure: Secondary | ICD-10-CM | POA: Diagnosis not present

## 2020-11-17 DIAGNOSIS — J439 Emphysema, unspecified: Secondary | ICD-10-CM | POA: Diagnosis not present

## 2020-11-17 DIAGNOSIS — E1151 Type 2 diabetes mellitus with diabetic peripheral angiopathy without gangrene: Secondary | ICD-10-CM | POA: Diagnosis not present

## 2020-11-17 DIAGNOSIS — I272 Pulmonary hypertension, unspecified: Secondary | ICD-10-CM | POA: Diagnosis not present

## 2020-11-19 DIAGNOSIS — I5032 Chronic diastolic (congestive) heart failure: Secondary | ICD-10-CM | POA: Diagnosis not present

## 2020-11-19 DIAGNOSIS — K625 Hemorrhage of anus and rectum: Secondary | ICD-10-CM | POA: Diagnosis not present

## 2020-11-19 DIAGNOSIS — I872 Venous insufficiency (chronic) (peripheral): Secondary | ICD-10-CM | POA: Diagnosis not present

## 2020-11-19 DIAGNOSIS — I272 Pulmonary hypertension, unspecified: Secondary | ICD-10-CM | POA: Diagnosis not present

## 2020-11-19 DIAGNOSIS — I480 Paroxysmal atrial fibrillation: Secondary | ICD-10-CM | POA: Diagnosis not present

## 2020-11-19 DIAGNOSIS — E1151 Type 2 diabetes mellitus with diabetic peripheral angiopathy without gangrene: Secondary | ICD-10-CM | POA: Diagnosis not present

## 2020-11-19 DIAGNOSIS — J439 Emphysema, unspecified: Secondary | ICD-10-CM | POA: Diagnosis not present

## 2020-11-19 DIAGNOSIS — I2510 Atherosclerotic heart disease of native coronary artery without angina pectoris: Secondary | ICD-10-CM | POA: Diagnosis not present

## 2020-11-19 DIAGNOSIS — I110 Hypertensive heart disease with heart failure: Secondary | ICD-10-CM | POA: Diagnosis not present

## 2020-11-23 ENCOUNTER — Encounter: Payer: Self-pay | Admitting: Internal Medicine

## 2020-11-23 ENCOUNTER — Other Ambulatory Visit: Payer: Self-pay | Admitting: Nurse Practitioner

## 2020-11-23 DIAGNOSIS — I5032 Chronic diastolic (congestive) heart failure: Secondary | ICD-10-CM

## 2020-11-23 DIAGNOSIS — K922 Gastrointestinal hemorrhage, unspecified: Secondary | ICD-10-CM

## 2020-11-23 DIAGNOSIS — J449 Chronic obstructive pulmonary disease, unspecified: Secondary | ICD-10-CM

## 2020-11-23 NOTE — Telephone Encounter (Signed)
Pt notified to have labs drawn and chest xray before appt on Friday

## 2020-11-23 NOTE — Telephone Encounter (Signed)
Appointment has to be Friday or prior to be a TOC. Looks like he was d/c on 6/24, and we only have 14 days.  I sent in labs and CXR, so he can have them drawn any time.

## 2020-11-24 ENCOUNTER — Ambulatory Visit (HOSPITAL_COMMUNITY)
Admission: RE | Admit: 2020-11-24 | Discharge: 2020-11-24 | Disposition: A | Discharge status: Home / Self Care | Payer: Medicare HMO | Source: Ambulatory Visit | Attending: Nurse Practitioner | Admitting: Nurse Practitioner

## 2020-11-24 ENCOUNTER — Ambulatory Visit (INDEPENDENT_AMBULATORY_CARE_PROVIDER_SITE_OTHER): Payer: Medicare HMO

## 2020-11-24 DIAGNOSIS — J449 Chronic obstructive pulmonary disease, unspecified: Secondary | ICD-10-CM | POA: Diagnosis not present

## 2020-11-24 DIAGNOSIS — I5032 Chronic diastolic (congestive) heart failure: Secondary | ICD-10-CM | POA: Insufficient documentation

## 2020-11-24 DIAGNOSIS — I442 Atrioventricular block, complete: Secondary | ICD-10-CM

## 2020-11-24 DIAGNOSIS — K625 Hemorrhage of anus and rectum: Secondary | ICD-10-CM | POA: Diagnosis not present

## 2020-11-24 DIAGNOSIS — I480 Paroxysmal atrial fibrillation: Secondary | ICD-10-CM | POA: Diagnosis not present

## 2020-11-24 DIAGNOSIS — I110 Hypertensive heart disease with heart failure: Secondary | ICD-10-CM | POA: Diagnosis not present

## 2020-11-24 DIAGNOSIS — J811 Chronic pulmonary edema: Secondary | ICD-10-CM | POA: Diagnosis not present

## 2020-11-24 DIAGNOSIS — I872 Venous insufficiency (chronic) (peripheral): Secondary | ICD-10-CM | POA: Diagnosis not present

## 2020-11-24 DIAGNOSIS — K922 Gastrointestinal hemorrhage, unspecified: Secondary | ICD-10-CM | POA: Diagnosis not present

## 2020-11-24 DIAGNOSIS — I2510 Atherosclerotic heart disease of native coronary artery without angina pectoris: Secondary | ICD-10-CM | POA: Diagnosis not present

## 2020-11-24 DIAGNOSIS — R0602 Shortness of breath: Secondary | ICD-10-CM | POA: Diagnosis not present

## 2020-11-24 DIAGNOSIS — J439 Emphysema, unspecified: Secondary | ICD-10-CM | POA: Diagnosis not present

## 2020-11-24 DIAGNOSIS — I517 Cardiomegaly: Secondary | ICD-10-CM | POA: Diagnosis not present

## 2020-11-24 DIAGNOSIS — E1151 Type 2 diabetes mellitus with diabetic peripheral angiopathy without gangrene: Secondary | ICD-10-CM | POA: Diagnosis not present

## 2020-11-24 DIAGNOSIS — I272 Pulmonary hypertension, unspecified: Secondary | ICD-10-CM | POA: Diagnosis not present

## 2020-11-24 NOTE — Progress Notes (Signed)
We will discuss this at his appt on 11/26/20. No need to call him.

## 2020-11-25 ENCOUNTER — Telehealth: Payer: Self-pay

## 2020-11-25 DIAGNOSIS — J439 Emphysema, unspecified: Secondary | ICD-10-CM | POA: Diagnosis not present

## 2020-11-25 DIAGNOSIS — I272 Pulmonary hypertension, unspecified: Secondary | ICD-10-CM | POA: Diagnosis not present

## 2020-11-25 DIAGNOSIS — K625 Hemorrhage of anus and rectum: Secondary | ICD-10-CM | POA: Diagnosis not present

## 2020-11-25 DIAGNOSIS — I480 Paroxysmal atrial fibrillation: Secondary | ICD-10-CM | POA: Diagnosis not present

## 2020-11-25 DIAGNOSIS — E1151 Type 2 diabetes mellitus with diabetic peripheral angiopathy without gangrene: Secondary | ICD-10-CM | POA: Diagnosis not present

## 2020-11-25 DIAGNOSIS — I872 Venous insufficiency (chronic) (peripheral): Secondary | ICD-10-CM | POA: Diagnosis not present

## 2020-11-25 DIAGNOSIS — I2510 Atherosclerotic heart disease of native coronary artery without angina pectoris: Secondary | ICD-10-CM | POA: Diagnosis not present

## 2020-11-25 DIAGNOSIS — I5032 Chronic diastolic (congestive) heart failure: Secondary | ICD-10-CM | POA: Diagnosis not present

## 2020-11-25 DIAGNOSIS — I110 Hypertensive heart disease with heart failure: Secondary | ICD-10-CM | POA: Diagnosis not present

## 2020-11-25 LAB — CUP PACEART REMOTE DEVICE CHECK
Battery Remaining Longevity: 94 mo
Battery Remaining Percentage: 76 %
Battery Voltage: 3.01 V
Brady Statistic AP VP Percent: 1.3 %
Brady Statistic AP VS Percent: 3 %
Brady Statistic AS VP Percent: 2 %
Brady Statistic AS VS Percent: 94 %
Brady Statistic RA Percent Paced: 4.1 %
Brady Statistic RV Percent Paced: 3.3 %
Date Time Interrogation Session: 20220706024718
Implantable Lead Implant Date: 20190409
Implantable Lead Implant Date: 20190409
Implantable Lead Location: 753859
Implantable Lead Location: 753860
Implantable Pulse Generator Implant Date: 20190409
Lead Channel Impedance Value: 540 Ohm
Lead Channel Impedance Value: 550 Ohm
Lead Channel Pacing Threshold Amplitude: 0.5 V
Lead Channel Pacing Threshold Amplitude: 0.75 V
Lead Channel Pacing Threshold Pulse Width: 0.5 ms
Lead Channel Pacing Threshold Pulse Width: 0.5 ms
Lead Channel Sensing Intrinsic Amplitude: 4.6 mV
Lead Channel Sensing Intrinsic Amplitude: 5 mV
Lead Channel Setting Pacing Amplitude: 2 V
Lead Channel Setting Pacing Amplitude: 2.5 V
Lead Channel Setting Pacing Pulse Width: 0.5 ms
Lead Channel Setting Sensing Sensitivity: 2 mV
Pulse Gen Model: 2272
Pulse Gen Serial Number: 9010865

## 2020-11-25 LAB — CBC WITH DIFFERENTIAL/PLATELET
Basophils Absolute: 0.1 10*3/uL (ref 0.0–0.2)
Basos: 1 %
EOS (ABSOLUTE): 0.3 10*3/uL (ref 0.0–0.4)
Eos: 4 %
Hematocrit: 40.9 % (ref 37.5–51.0)
Hemoglobin: 12 g/dL — ABNORMAL LOW (ref 13.0–17.7)
Immature Grans (Abs): 0 10*3/uL (ref 0.0–0.1)
Immature Granulocytes: 0 %
Lymphocytes Absolute: 1.8 10*3/uL (ref 0.7–3.1)
Lymphs: 23 %
MCH: 23.8 pg — ABNORMAL LOW (ref 26.6–33.0)
MCHC: 29.3 g/dL — ABNORMAL LOW (ref 31.5–35.7)
MCV: 81 fL (ref 79–97)
Monocytes Absolute: 0.4 10*3/uL (ref 0.1–0.9)
Monocytes: 5 %
Neutrophils Absolute: 5.4 10*3/uL (ref 1.4–7.0)
Neutrophils: 67 %
Platelets: 320 10*3/uL (ref 150–450)
RBC: 5.04 x10E6/uL (ref 4.14–5.80)
RDW: 18.7 % — ABNORMAL HIGH (ref 11.6–15.4)
WBC: 8 10*3/uL (ref 3.4–10.8)

## 2020-11-25 LAB — CMP14+EGFR
ALT: 27 IU/L (ref 0–44)
AST: 41 IU/L — ABNORMAL HIGH (ref 0–40)
Albumin/Globulin Ratio: 1.2 (ref 1.2–2.2)
Albumin: 4.3 g/dL (ref 4.0–5.0)
Alkaline Phosphatase: 98 IU/L (ref 44–121)
BUN/Creatinine Ratio: 23 — ABNORMAL HIGH (ref 9–20)
BUN: 36 mg/dL — ABNORMAL HIGH (ref 6–24)
Bilirubin Total: 0.7 mg/dL (ref 0.0–1.2)
CO2: 26 mmol/L (ref 20–29)
Calcium: 9.5 mg/dL (ref 8.7–10.2)
Chloride: 98 mmol/L (ref 96–106)
Creatinine, Ser: 1.57 mg/dL — ABNORMAL HIGH (ref 0.76–1.27)
Globulin, Total: 3.6 g/dL (ref 1.5–4.5)
Glucose: 112 mg/dL — ABNORMAL HIGH (ref 65–99)
Potassium: 4 mmol/L (ref 3.5–5.2)
Sodium: 143 mmol/L (ref 134–144)
Total Protein: 7.9 g/dL (ref 6.0–8.5)
eGFR: 54 mL/min/{1.73_m2} — ABNORMAL LOW (ref >59)

## 2020-11-25 NOTE — Progress Notes (Signed)
Hgb improving.  His kidney function is lower than previous.  We will talk about this tomorrow at our appt.

## 2020-11-25 NOTE — Telephone Encounter (Signed)
Sharyn Lull called from [facility]. Has completed home health. Needs order for home health PT 1 x week for 4 weeks call back # [phone]

## 2020-11-25 NOTE — Progress Notes (Signed)
Electrophysiology Office Note Date: 11/26/2020  ID:  ABDULAZIZ TOMAN, DOB 02-01-1971, MRN [id number]  PCP: [name], MD Primary Cardiologist: Jenkins Rouge, MD Electrophysiologist: Thompson Grayer, MD   CC: Pacemaker follow-up  GUMECINDO HOPKIN is a 50 y.o. male seen today for Thompson Grayer, MD for post hospital follow up.    Admitted 6/24 - 6/27  for intermittent BRBPR. H&H stable. Thought to be most likely hemorrhoidal. Noted increased pacing on tele and asked to interrogated.  Pt has had known intermittent dependence in the past. Re-assurance given.   Since discharge from hospital the patient reports doing well. No further bleeding since discharge. Mild hemorrhoidal streaking noted at times. He does not like the idea of "needing" his pacemaker.  he denies chest pain, palpitations, dyspnea, PND, orthopnea, nausea, vomiting, dizziness, syncope, edema, weight gain, or early satiety.  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  Past Medical History:  Diagnosis Date   Anxiety    Aortic stenosis    Arthritis    Asthma    Back pain    Cellulitis of skin with lymphangitis    CHF (congestive heart failure) (HCC)    Chronic diastolic congestive heart failure (HCC)    Chronic venous insufficiency    Constipation    COPD (chronic obstructive pulmonary disease) (HCC)    Depression    Diabetes ([place])    Dyspnea    Essential hypertension    Gout    Heart murmur    Hypertension    Morbid obesity ([place])    Obesity    Pneumonia    walking pneumonia   Prostatitis    Pulmonary embolism ([place])    Pulmonary hypertension (HCC)    Pyelonephritis    S/P aortic valve replacement with bioprosthetic valve 08/23/2017   25 mm Edwards Inspiris Resilia stented bovine pericardial tissue valve   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   24 mm Hemashield supracoronary straight graft    Sleep apnea    cpap    Thoracic  ascending aortic aneurysm ([place])    Thoracic ascending aortic aneurysm ([place])    Tobacco abuse    Past Surgical History:  Procedure Laterality Date   AORTIC VALVE REPLACEMENT N/A 08/23/2017   Procedure: AORTIC VALVE REPLACEMENT (AVR) USING INSPIRIS RESILIA AORTIC VALVE SIZE 25 MM;  Surgeon: Rexene Alberts, MD;  Location: [place];  Service: Open Heart Surgery;  Laterality: N/A;   CARDIAC VALVE REPLACEMENT N/A    Phreesia 02/07/2020   COLONOSCOPY WITH PROPOFOL N/A 11/14/2020   Procedure: COLONOSCOPY WITH PROPOFOL;  Surgeon: Gatha Mayer, MD;  Location: [facility] ENDOSCOPY;  Service: Endoscopy;  Laterality: N/A;   ESOPHAGOGASTRODUODENOSCOPY (EGD) WITH PROPOFOL N/A 11/14/2020   Procedure: ESOPHAGOGASTRODUODENOSCOPY (EGD) WITH PROPOFOL;  Surgeon: Gatha Mayer, MD;  Location: [place];  Service: Endoscopy;  Laterality: N/A;   MULTIPLE EXTRACTIONS WITH ALVEOLOPLASTY N/A 07/23/2017   Procedure: Extraction of tooth #10 with alveoloplasty and gross debridement of remaining teeth;  Surgeon: Lenn Cal, DDS;  Location: [place];  Service: Oral Surgery;  Laterality: N/A;   PACEMAKER IMPLANT N/A 08/28/2017    St Jude Medical Assurity MRI conditional  dual-chamber pacemaker for symptomatic complete heart blockby Dr Rayann Heman   TEE WITHOUT CARDIOVERSION N/A 07/16/2017   Procedure: TRANSESOPHAGEAL ECHOCARDIOGRAM (TEE);  Surgeon: Lelon Perla, MD;  Location: [facility] ENDOSCOPY;  Service: Cardiovascular;  Laterality: N/A;   TEE WITHOUT CARDIOVERSION N/A 08/23/2017  Procedure: TRANSESOPHAGEAL ECHOCARDIOGRAM (TEE);  Surgeon: Rexene Alberts, MD;  Location: [place];  Service: Open Heart Surgery;  Laterality: N/A;   THORACIC AORTIC ANEURYSM REPAIR N/A 08/23/2017   Procedure: THORACIC ASCENDING ANEURYSM REPAIR (AAA) USING HEMASHIELD GOLD KNITTED MICROVEL DOUBLE VELOUR VASCULAR GRAFT D: 24 MM  L: 30 CM;  Surgeon: Rexene Alberts, MD;  Location: [place];  Service: Open Heart Surgery;  Laterality: N/A;    Current  Outpatient Medications  Medication Sig Dispense Refill   acetaminophen (TYLENOL) 325 MG tablet Take 650 mg by mouth every 6 (six) hours as needed for mild pain.     albuterol (PROVENTIL) (2.5 MG/3ML) 0.083% nebulizer solution INHALE 1 VIAL VIA NEBULIZER EVERY 6 HOURS AS NEEDED FOR WHEEZING OR SHORTNESS OF BREATH 270 mL 1   albuterol (VENTOLIN HFA) 108 (90 Base) MCG/ACT inhaler Inhale 2 puffs into the lungs every 6 (six) hours as needed for wheezing or shortness of breath. 8 g 3   allopurinol (ZYLOPRIM) 300 MG tablet TAKE 1 TABLET EVERY DAY 90 tablet 0   ammonium lactate (AMLACTIN) 12 % lotion Apply 1 application topically as needed for dry skin. 400 g 0   atorvastatin (LIPITOR) 40 MG tablet TAKE 1 TABLET EVERY DAY 90 tablet 0   Blood Glucose Monitoring Suppl (TRUE METRIX METER) w/Device KIT USE AS DIRECTED 1 kit 0   busPIRone (BUSPAR) 10 MG tablet TAKE 1 TABLET THREE TIMES DAILY 270 tablet 0   citalopram (CELEXA) 40 MG tablet TAKE 1 TABLET EVERY DAY 90 tablet 0   diclofenac Sodium (VOLTAREN) 1 % GEL Apply 2 g topically 4 (four) times daily. 150 g 1   docusate sodium (COLACE) 100 MG capsule Take 2 capsules (200 mg total) by mouth 2 (two) times daily. 30 capsule 0   fexofenadine (ALLEGRA) 180 MG tablet Take 180 mg by mouth daily.     gabapentin (NEURONTIN) 100 MG capsule Take 1 capsule in the morning and 2 capsules in the evening. 90 capsule 0   glipiZIDE (GLUCOTROL XL) 5 MG 24 hr tablet Take 1 tablet (5 mg total) by mouth daily with breakfast. 30 tablet 5   Iron, Ferrous Sulfate, 325 (65 Fe) MG TABS Take 325 mg by mouth daily. 90 tablet 1   Krill Oil 350 MG CAPS Take 1 capsule by mouth in the morning and at bedtime.      loratadine (CLARITIN) 10 MG tablet Take 10 mg by mouth daily as needed for allergies.     metFORMIN (GLUCOPHAGE) 850 MG tablet Take 1 tablet (850 mg total) by mouth 2 (two) times daily with a meal. 180 tablet 1   metolazone (ZAROXOLYN) 5 MG tablet PRN 90 tablet 3   metoprolol  tartrate (LOPRESSOR) 100 MG tablet Take 1 tablet (100 mg total) by mouth 2 (two) times daily. 180 tablet 3   mupirocin ointment (BACTROBAN) 2 % Apply 1 application topically 2 (two) times daily. 22 g 0   pantoprazole (PROTONIX) 40 MG tablet Take 1 tablet (40 mg total) by mouth daily. 90 tablet 3   polyethylene glycol (MIRALAX) 17 g packet Take 17 g by mouth daily as needed. 14 each 0   potassium chloride SA (KLOR-CON) 20 MEQ tablet TAKE 3 TABS DAILY AND TAKE AN ADDITIONAL 1 TAB ON THE DAY YOU TAKE YOUR WEEKLY METOLAZONE DOSE 283 tablet 3   rivaroxaban (XARELTO) 20 MG TABS tablet TAKE 1 Tablet BY MOUTH ONCE EVERY DAY WITH SUPPER 90 tablet 3   Semaglutide (RYBELSUS) 7 MG  TABS Take 1 tablet by mouth daily before breakfast.     terbinafine (LAMISIL) 250 MG tablet Take 1 tablet (250 mg total) by mouth daily. 28 tablet 2   TRUE METRIX BLOOD GLUCOSE TEST test strip TEST ONE TIME DAILY FOR DIABETES 100 strip 5   TRUEplus Lancets 33G MISC USE TO TEST ONE TIME DAILY FOR DIABETES 100 each 5   lisinopril (ZESTRIL) 20 MG tablet Take 1 tablet (20 mg total) by mouth daily. 90 tablet 3   torsemide (DEMADEX) 20 MG tablet Take 2 tablets (40 mg total) by mouth 2 (two) times daily. 360 tablet 3   No current facility-administered medications for this visit.    Allergies:   Patient has no known allergies.   Social History: Social History   Socioeconomic History   Marital status: Divorced    Spouse name: Not on file   Number of children: Not on file   Years of education: Not on file   Highest education level: Not on file  Occupational History   Not on file  Tobacco Use   Smoking status: Former    Packs/day: 2.00    Years: 16.00    Pack years: 32.00    Types: Cigarettes    Quit date: 08/23/2017    Years since quitting: 3.2   Smokeless tobacco: Never  Vaping Use   Vaping Use: Never used  Substance and Sexual Activity   Alcohol use: No    Comment: have had alcohol in the past, not heavy   Drug use: No    Sexual activity: Not Currently    Partners: Female  Other Topics Concern   Not on file  Social History Narrative   Not on file   Social Determinants of Health   Financial Resource Strain: Low Risk    Difficulty of Paying Living Expenses: Not very hard  Food Insecurity: No Food Insecurity   Worried About Charity fundraiser in the Last Year: Never true   [place] in the Last Year: Never true  Transportation Needs: No Transportation Needs   Lack of Transportation (Medical): No   Lack of Transportation (Non-Medical): No  Physical Activity: Inactive   Days of Exercise per Week: 0 days   Minutes of Exercise per Session: 0 min  Stress: Stress Concern Present   Feeling of Stress : Rather much  Social Connections: Socially Isolated   Frequency of Communication with Friends and Family: More than three times a week   Frequency of Social Gatherings with Friends and Family: Once a week   Attends Religious Services: Never   Marine scientist or Organizations: No   Attends Music therapist: Never   Marital Status: Divorced  Human resources officer Violence: Not At Risk   Fear of Current or Ex-Partner: No   Emotionally Abused: No   Physically Abused: No   Sexually Abused: No    Family History: Family History  Problem Relation Age of Onset   Hypertension Mother    Alzheimer's disease Mother    Heart attack Mother    Heart attack Brother    Diabetes Brother      Review of Systems: All other systems reviewed and are otherwise negative except as noted above.  Physical Exam: Vitals:   11/26/20 1009  BP: 138/74  Pulse: 68  SpO2: 95%  Weight: (!) 463 lb 12.8 oz (210.4 kg)  Height: '5\' 11"'  (1.803 m)     GEN- The patient is well appearing, alert and  oriented x 3 today.   HEENT: normocephalic, atraumatic; sclera clear, conjunctiva pink; hearing intact; oropharynx clear; neck supple  Lungs- Clear to ausculation bilaterally, normal work of breathing.  No  wheezes, rales, rhonchi Heart- Regular rate and rhythm, no murmurs, rubs or gallops  GI- soft, non-tender, non-distended, bowel sounds present  Extremities- no clubbing or cyanosis. No edema MS- no significant deformity or atrophy Skin- warm and dry, no rash or lesion; PPM pocket well healed Psych- euthymic mood, full affect Neuro- strength and sensation are intact  PPM Interrogation- reviewed in detail today,  See PACEART report  EKG:  EKG is not ordered today.  Recent Labs: 07/09/2020: B Natriuretic Peptide 70.0 10/04/2020: TSH 4.060 11/15/2020: Magnesium 2.2 11/24/2020: ALT 27; BUN 36; Creatinine, Ser 1.57; Hemoglobin 12.0; Platelets 320; Potassium 4.0; Sodium 143   Wt Readings from Last 3 Encounters:  11/26/20 (!) 463 lb 12.8 oz (210.4 kg)  11/26/20 (!) 470 lb (213.2 kg)  11/12/20 (!) 489 lb (221.8 kg)     Other studies Reviewed: Additional studies/ records that were reviewed today include: Previous EP office notes, Previous remote checks, Most recent labwork.   Assessment and Plan:  1. Symptomatic bradycardia s/p St. Jude PPM  Normal PPM function See Pace Art report Pt not dependent or pacing today.  He has not previously been dependent; increased dependence has him concerned re: battery life. Encouraged him that we will continue to monitor his battery life very closely. R waves <2:1 margin previously noted. Sensitivity adjusted to 1.5 mv for now.  2. AFib Burden <1%% Continue Xarelto for CHA2DS2/VASc of at least 5.   3. HTN Continue current medication  4. OSA Encouraged nightly PAP  5. S/p bioprosthetic AVR with thoracic aneurysm repair Stable on most recent echo 07/2020  Current medicines are reviewed at length with the patient today.   The patient does not have concerns regarding his medicines.  The following changes were made today:  none  Labs/ tests ordered today include: Labs from 7/6 reviewed   Disposition:   Follow up with Dr. Rayann Heman in [place] in 12  months.   Jacalyn Lefevre, PA-C  11/26/2020 10:12 AM  [facility] [address] [place] [address] [phone] (office) [phone] (fax)

## 2020-11-26 ENCOUNTER — Inpatient Hospital Stay: Payer: Medicare HMO | Admitting: Internal Medicine

## 2020-11-26 ENCOUNTER — Encounter: Payer: Self-pay | Admitting: Nurse Practitioner

## 2020-11-26 ENCOUNTER — Ambulatory Visit: Payer: Medicare HMO | Admitting: Student

## 2020-11-26 ENCOUNTER — Other Ambulatory Visit: Payer: Self-pay

## 2020-11-26 ENCOUNTER — Ambulatory Visit (INDEPENDENT_AMBULATORY_CARE_PROVIDER_SITE_OTHER): Payer: Medicare HMO | Admitting: Nurse Practitioner

## 2020-11-26 ENCOUNTER — Encounter: Payer: Self-pay | Admitting: Student

## 2020-11-26 VITALS — BP 138/74 | HR 68 | Ht 71.0 in | Wt >= 6400 oz

## 2020-11-26 DIAGNOSIS — I442 Atrioventricular block, complete: Secondary | ICD-10-CM

## 2020-11-26 DIAGNOSIS — I5032 Chronic diastolic (congestive) heart failure: Secondary | ICD-10-CM

## 2020-11-26 DIAGNOSIS — Z952 Presence of prosthetic heart valve: Secondary | ICD-10-CM

## 2020-11-26 DIAGNOSIS — I480 Paroxysmal atrial fibrillation: Secondary | ICD-10-CM | POA: Diagnosis not present

## 2020-11-26 DIAGNOSIS — G4733 Obstructive sleep apnea (adult) (pediatric): Secondary | ICD-10-CM | POA: Diagnosis not present

## 2020-11-26 DIAGNOSIS — J449 Chronic obstructive pulmonary disease, unspecified: Secondary | ICD-10-CM

## 2020-11-26 DIAGNOSIS — R195 Other fecal abnormalities: Secondary | ICD-10-CM | POA: Diagnosis not present

## 2020-11-26 DIAGNOSIS — K625 Hemorrhage of anus and rectum: Secondary | ICD-10-CM

## 2020-11-26 DIAGNOSIS — Z9989 Dependence on other enabling machines and devices: Secondary | ICD-10-CM

## 2020-11-26 DIAGNOSIS — K219 Gastro-esophageal reflux disease without esophagitis: Secondary | ICD-10-CM

## 2020-11-26 LAB — CUP PACEART INCLINIC DEVICE CHECK
Battery Remaining Longevity: 142 mo
Battery Voltage: 3.01 V
Brady Statistic RA Percent Paced: 4.1 %
Brady Statistic RV Percent Paced: 3.2 %
Date Time Interrogation Session: 20220708102329
Implantable Lead Implant Date: 20190409
Implantable Lead Implant Date: 20190409
Implantable Lead Location: 753859
Implantable Lead Location: 753860
Implantable Pulse Generator Implant Date: 20190409
Lead Channel Impedance Value: 537.5 Ohm
Lead Channel Impedance Value: 562.5 Ohm
Lead Channel Pacing Threshold Amplitude: 0.5 V
Lead Channel Pacing Threshold Amplitude: 0.5 V
Lead Channel Pacing Threshold Amplitude: 0.75 V
Lead Channel Pacing Threshold Amplitude: 0.75 V
Lead Channel Pacing Threshold Pulse Width: 0.5 ms
Lead Channel Pacing Threshold Pulse Width: 0.5 ms
Lead Channel Pacing Threshold Pulse Width: 0.5 ms
Lead Channel Pacing Threshold Pulse Width: 0.5 ms
Lead Channel Sensing Intrinsic Amplitude: 3.4 mV
Lead Channel Sensing Intrinsic Amplitude: 5 mV
Lead Channel Setting Pacing Amplitude: 2 V
Lead Channel Setting Pacing Amplitude: 2.5 V
Lead Channel Setting Pacing Pulse Width: 0.5 ms
Lead Channel Setting Sensing Sensitivity: 1.5 mV
Pulse Gen Model: 2272
Pulse Gen Serial Number: 9010865

## 2020-11-26 NOTE — Assessment & Plan Note (Signed)
-  follow-up with GI

## 2020-11-26 NOTE — Progress Notes (Signed)
Acute Office Visit  Subjective:    Patient ID: Reginald [name], male    DOB: Jan 22, 1971, 50 y.o.   MRN: [id number]  Chief Complaint  Patient presents with   Anemia    Hospital follow up     Anemia  Patient is in today for hospital follow-up. He was admitted from 6/24-6/27/22 for black,tarry stools and rectal bleeding. On 6/24 his Hgb was 10. It was rechecked 2 days ago and was 12.0, so it has been improving.   His discharge note states, "Black stools and some bright red blood per rectum with lower GI bleed - his H&H was essentially unchanged, has black stools most likely were due to over an iron supplement and intermittent red blood per rectum were due to hemorrhoids, he underwent EGD and colonoscopy which were both again unremarkable except for hemorrhoids, he was seen by [facility], currently placed on stool softener, continue oral iron supplementation and follow-up with PCP and primary gastroenterologist closely post discharge.  He is currently symptom-free.   2.  Morbid obesity with OSA and fatty liver.  Follow with PCP for weight loss CPAP nightly.   3.  Chronic diastolic CHF EF 70% on last echocardiogram.  On beta-blocker, low-dose torsemide to continue, monitor.   4.  CAD s/p CABG.  Continue beta-blocker, statin can [place] prevention.  Resume home dose Xarelto.   5.  Paroxysmal A. fib.  Mali vas 2 score of greater than 3.  On beta-blocker.  Continue Xarelto.   6.  History of complete heart block.  Has pacemaker.  Will follow with EP at home.   7.  History of gout.  Stable on allopurinol.     8. DM type II.  Continue home regimen and follow with PCP for glycemic control."    Past Medical History:  Diagnosis Date   Anxiety    Aortic stenosis    Arthritis    Asthma    Back pain    Cellulitis of skin with lymphangitis    CHF (congestive heart failure) (HCC)    Chronic diastolic congestive heart failure (HCC)    Chronic venous insufficiency    Constipation    COPD  (chronic obstructive pulmonary disease) (HCC)    Depression    Diabetes ([place])    Dyspnea    Essential hypertension    Gout    Heart murmur    Hypertension    Morbid obesity ([place])    Obesity    Pneumonia    walking pneumonia   Prostatitis    Pulmonary embolism ([place])    Pulmonary hypertension (HCC)    Pyelonephritis    S/P aortic valve replacement with bioprosthetic valve 08/23/2017   25 mm Edwards Inspiris Resilia stented bovine pericardial tissue valve   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   24 mm Hemashield supracoronary straight graft    Sleep apnea    cpap    Thoracic ascending aortic aneurysm ([place])    Thoracic ascending aortic aneurysm ([place])    Tobacco abuse     Past Surgical History:  Procedure Laterality Date   AORTIC VALVE REPLACEMENT N/A 08/23/2017   Procedure: AORTIC VALVE REPLACEMENT (AVR) USING INSPIRIS RESILIA AORTIC VALVE SIZE 25 MM;  Surgeon: Rexene Alberts, MD;  Location: [facility];  Service: Open Heart Surgery;  Laterality: N/A;   CARDIAC VALVE REPLACEMENT N/A    Phreesia 02/07/2020   COLONOSCOPY WITH PROPOFOL N/A 11/14/2020   Procedure: COLONOSCOPY WITH PROPOFOL;  Surgeon: Gatha Mayer, MD;  Location: [facility];  Service: Endoscopy;  Laterality: N/A;   ESOPHAGOGASTRODUODENOSCOPY (EGD) WITH PROPOFOL N/A 11/14/2020   Procedure: ESOPHAGOGASTRODUODENOSCOPY (EGD) WITH PROPOFOL;  Surgeon: Gatha Mayer, MD;  Location: [facility];  Service: Endoscopy;  Laterality: N/A;   MULTIPLE EXTRACTIONS WITH ALVEOLOPLASTY N/A 07/23/2017   Procedure: Extraction of tooth #10 with alveoloplasty and gross debridement of remaining teeth;  Surgeon: Lenn Cal, DDS;  Location: [facility];  Service: Oral Surgery;  Laterality: N/A;   PACEMAKER IMPLANT N/A 08/28/2017    St Jude Medical Assurity MRI conditional  dual-chamber pacemaker for symptomatic complete heart blockby Dr Rayann Heman   TEE WITHOUT CARDIOVERSION N/A 07/16/2017   Procedure: TRANSESOPHAGEAL ECHOCARDIOGRAM  (TEE);  Surgeon: Lelon Perla, MD;  Location: [facility] ENDOSCOPY;  Service: Cardiovascular;  Laterality: N/A;   TEE WITHOUT CARDIOVERSION N/A 08/23/2017   Procedure: TRANSESOPHAGEAL ECHOCARDIOGRAM (TEE);  Surgeon: Rexene Alberts, MD;  Location: [facility];  Service: Open Heart Surgery;  Laterality: N/A;   THORACIC AORTIC ANEURYSM REPAIR N/A 08/23/2017   Procedure: THORACIC ASCENDING ANEURYSM REPAIR (AAA) USING HEMASHIELD GOLD KNITTED MICROVEL DOUBLE VELOUR VASCULAR GRAFT D: 24 MM  L: 30 CM;  Surgeon: Rexene Alberts, MD;  Location: [facility];  Service: Open Heart Surgery;  Laterality: N/A;    Family History  Problem Relation Age of Onset   Hypertension Mother    Alzheimer's disease Mother    Heart attack Mother    Heart attack Brother    Diabetes Brother     Social History   Socioeconomic History   Marital status: Divorced    Spouse name: Not on file   Number of children: Not on file   Years of education: Not on file   Highest education level: Not on file  Occupational History   Not on file  Tobacco Use   Smoking status: Former    Packs/day: 2.00    Years: 16.00    Pack years: 32.00    Types: Cigarettes    Quit date: 08/23/2017    Years since quitting: 3.2   Smokeless tobacco: Never  Vaping Use   Vaping Use: Never used  Substance and Sexual Activity   Alcohol use: No    Comment: have had alcohol in the past, not heavy   Drug use: No   Sexual activity: Not Currently    Partners: Female  Other Topics Concern   Not on file  Social History Narrative   Not on file   Social Determinants of Health   Financial Resource Strain: Low Risk    Difficulty of Paying Living Expenses: Not very hard  Food Insecurity: No Food Insecurity   Worried About Charity fundraiser in the Last Year: Never true   [place] in the Last Year: Never true  Transportation Needs: No Transportation Needs   Lack of Transportation (Medical): No   Lack of Transportation (Non-Medical): No  Physical  Activity: Inactive   Days of Exercise per Week: 0 days   Minutes of Exercise per Session: 0 min  Stress: Stress Concern Present   Feeling of Stress : Rather much  Social Connections: Socially Isolated   Frequency of Communication with Friends and Family: More than three times a week   Frequency of Social Gatherings with Friends and Family: Once a week   Attends Religious Services: Never   Marine scientist or Organizations: No   Attends Archivist Meetings: Never   Marital Status: Divorced  Human resources officer Violence: Not At  Risk   Fear of Current or Ex-Partner: No   Emotionally Abused: No   Physically Abused: No   Sexually Abused: No    Outpatient Medications Prior to Visit  Medication Sig Dispense Refill   acetaminophen (TYLENOL) 325 MG tablet Take 650 mg by mouth every 6 (six) hours as needed for mild pain.     albuterol (PROVENTIL) (2.5 MG/3ML) 0.083% nebulizer solution INHALE 1 VIAL VIA NEBULIZER EVERY 6 HOURS AS NEEDED FOR WHEEZING OR SHORTNESS OF BREATH 270 mL 1   albuterol (VENTOLIN HFA) 108 (90 Base) MCG/ACT inhaler Inhale 2 puffs into the lungs every 6 (six) hours as needed for wheezing or shortness of breath. 8 g 3   allopurinol (ZYLOPRIM) 300 MG tablet TAKE 1 TABLET EVERY DAY 90 tablet 0   ammonium lactate (AMLACTIN) 12 % lotion Apply 1 application topically as needed for dry skin. 400 g 0   atorvastatin (LIPITOR) 40 MG tablet TAKE 1 TABLET EVERY DAY 90 tablet 0   Blood Glucose Monitoring Suppl (TRUE METRIX METER) w/Device KIT USE AS DIRECTED 1 kit 0   busPIRone (BUSPAR) 10 MG tablet TAKE 1 TABLET THREE TIMES DAILY 270 tablet 0   citalopram (CELEXA) 40 MG tablet TAKE 1 TABLET EVERY DAY 90 tablet 0   diclofenac Sodium (VOLTAREN) 1 % GEL Apply 2 g topically 4 (four) times daily. 150 g 1   docusate sodium (COLACE) 100 MG capsule Take 2 capsules (200 mg total) by mouth 2 (two) times daily. 30 capsule 0   fexofenadine (ALLEGRA) 180 MG tablet Take 180 mg by mouth  daily.     gabapentin (NEURONTIN) 100 MG capsule Take 1 capsule in the morning and 2 capsules in the evening. 90 capsule 0   glipiZIDE (GLUCOTROL XL) 5 MG 24 hr tablet Take 1 tablet (5 mg total) by mouth daily with breakfast. 30 tablet 5   Iron, Ferrous Sulfate, 325 (65 Fe) MG TABS Take 325 mg by mouth daily. 90 tablet 1   Krill Oil 350 MG CAPS Take 1 capsule by mouth in the morning and at bedtime.      loratadine (CLARITIN) 10 MG tablet Take 10 mg by mouth daily as needed for allergies.     metFORMIN (GLUCOPHAGE) 850 MG tablet Take 1 tablet (850 mg total) by mouth 2 (two) times daily with a meal. 180 tablet 1   metolazone (ZAROXOLYN) 5 MG tablet PRN 90 tablet 3   metoprolol tartrate (LOPRESSOR) 100 MG tablet Take 1 tablet (100 mg total) by mouth 2 (two) times daily. 180 tablet 3   mupirocin ointment (BACTROBAN) 2 % Apply 1 application topically 2 (two) times daily. 22 g 0   pantoprazole (PROTONIX) 40 MG tablet Take 1 tablet (40 mg total) by mouth daily. 90 tablet 3   polyethylene glycol (MIRALAX) 17 g packet Take 17 g by mouth daily as needed. 14 each 0   potassium chloride SA (KLOR-CON) 20 MEQ tablet TAKE 3 TABS DAILY AND TAKE AN ADDITIONAL 1 TAB ON THE DAY YOU TAKE YOUR WEEKLY METOLAZONE DOSE 283 tablet 3   rivaroxaban (XARELTO) 20 MG TABS tablet TAKE 1 Tablet BY MOUTH ONCE EVERY DAY WITH SUPPER 90 tablet 3   terbinafine (LAMISIL) 250 MG tablet Take 1 tablet (250 mg total) by mouth daily. 28 tablet 2   TRUE METRIX BLOOD GLUCOSE TEST test strip TEST ONE TIME DAILY FOR DIABETES 100 strip 5   TRUEplus Lancets 33G MISC USE TO TEST ONE TIME DAILY FOR DIABETES 100 each  5   lisinopril (ZESTRIL) 20 MG tablet Take 1 tablet (20 mg total) by mouth daily. 90 tablet 3   torsemide (DEMADEX) 20 MG tablet Take 2 tablets (40 mg total) by mouth 2 (two) times daily. 360 tablet 3   No facility-administered medications prior to visit.    No Known Allergies  Review of Systems  Constitutional: Negative.    Respiratory:         SOB with exertion at baseline  Cardiovascular: Negative.   Gastrointestinal: Negative.   Psychiatric/Behavioral: Negative.        Objective:    Physical Exam Constitutional:      Appearance: Normal appearance. He is obese.  Cardiovascular:     Rate and Rhythm: Normal rate and regular rhythm.     Pulses: Normal pulses.     Heart sounds: Murmur heard.  Pulmonary:     Effort: Pulmonary effort is normal.     Breath sounds: Normal breath sounds.  Abdominal:     Comments: obese  Neurological:     Mental Status: He is alert.  Psychiatric:        Behavior: Behavior normal.        Thought Content: Thought content normal.        Judgment: Judgment normal.     Comments: Anxious affect    BP 135/75 (BP Location: Left Arm, Patient Position: Standing, Cuff Size: Large)   Pulse 71   Temp 99.2 F (37.3 C)   Resp 20   Ht '5\' 11"'  (1.803 m)   Wt (!) 470 lb (213.2 kg)   SpO2 95%   BMI 65.55 kg/m  Wt Readings from Last 3 Encounters:  11/26/20 (!) 470 lb (213.2 kg)  11/12/20 (!) 489 lb (221.8 kg)  11/12/20 (!) 500 lb (226.8 kg)    Health Maintenance Due  Topic Date Due   OPHTHALMOLOGY EXAM  Never done   TETANUS/TDAP  Never done   Pneumococcal Vaccine 20-46 Years old (2 - PCV) 03/16/2020   COVID-19 Vaccine (3 - Booster for Janssen series) 05/10/2020    There are no preventive care reminders to display for this patient.   Lab Results  Component Value Date   TSH 4.060 10/04/2020   Lab Results  Component Value Date   WBC 8.0 11/24/2020   HGB 12.0 (L) 11/24/2020   HCT 40.9 11/24/2020   MCV 81 11/24/2020   PLT 320 11/24/2020   Lab Results  Component Value Date   NA 143 11/24/2020   K 4.0 11/24/2020   CO2 26 11/24/2020   GLUCOSE 112 (H) 11/24/2020   BUN 36 (H) 11/24/2020   CREATININE 1.57 (H) 11/24/2020   BILITOT 0.7 11/24/2020   ALKPHOS 98 11/24/2020   AST 41 (H) 11/24/2020   ALT 27 11/24/2020   PROT 7.9 11/24/2020   ALBUMIN 4.3 11/24/2020    CALCIUM 9.5 11/24/2020   ANIONGAP 10 11/15/2020   EGFR 54 (L) 11/24/2020   Lab Results  Component Value Date   CHOL 123 06/10/2020   Lab Results  Component Value Date   HDL 25 (L) 06/10/2020   Lab Results  Component Value Date   LDLCALC 54 06/10/2020   Lab Results  Component Value Date   TRIG 281 (H) 06/10/2020   Lab Results  Component Value Date   CHOLHDL 4.9 06/10/2020   Lab Results  Component Value Date   HGBA1C 5.4 11/12/2020       Assessment & Plan:   Problem List Items Addressed This Visit  Respiratory   COPD (chronic obstructive pulmonary disease) (HCC)    -CXR showed hyperinflation; this isn't new         Digestive   Gastroesophageal reflux disease    -follow-up with GI       Rectal bleeding    -Has some hemorrhoidal streaking, but Hgb is increasing -endoscopy at hospital showed internal and external hemorrhoids, but otherwise no abnormality         Other   Heme positive stool    -reviewed hospital notes         No orders of the defined types were placed in this encounter.    Noreene Larsson, NP

## 2020-11-26 NOTE — Assessment & Plan Note (Signed)
-  CXR showed hyperinflation; this isn't new

## 2020-11-26 NOTE — Assessment & Plan Note (Signed)
-  Has some hemorrhoidal streaking, but Hgb is increasing -endoscopy at hospital showed internal and external hemorrhoids, but otherwise no abnormality

## 2020-11-26 NOTE — Assessment & Plan Note (Signed)
-  reviewed hospital notes

## 2020-11-26 NOTE — Patient Instructions (Signed)
Medication Instructions:  Your physician recommends that you continue on your current medications as directed. Please refer to the Current Medication list given to you today.  *If you need a refill on your cardiac medications before your next appointment, please call your pharmacy*   Lab Work: None If you have labs (blood work) drawn today and your tests are completely normal, you will receive your results only by: [place] (if you have MyChart) OR A paper copy in the mail If you have any lab test that is abnormal or we need to change your treatment, we will call you to review the results.  Follow-Up: At [facility], you and your health needs are our priority.  As part of our continuing mission to provide you with exceptional heart care, we have created designated Provider Care Teams.  These Care Teams include your primary Cardiologist (physician) and Advanced Practice Providers (APPs -  Physician Assistants and Nurse Practitioners) who all work together to provide you with the care you need, when you need it.   Your next appointment:   1 year(s)  The format for your next appointment:   In Person  Provider:   You may see Thompson Grayer, MD or one of the following Advanced Practice Providers on your designated Care Team:   Tommye Standard, [place] Legrand Como "[facility]" [place], [place]

## 2020-11-26 NOTE — Telephone Encounter (Signed)
Reginald [name] informed.

## 2020-11-29 DIAGNOSIS — K625 Hemorrhage of anus and rectum: Secondary | ICD-10-CM | POA: Diagnosis not present

## 2020-11-29 DIAGNOSIS — I872 Venous insufficiency (chronic) (peripheral): Secondary | ICD-10-CM | POA: Diagnosis not present

## 2020-11-29 DIAGNOSIS — I5032 Chronic diastolic (congestive) heart failure: Secondary | ICD-10-CM | POA: Diagnosis not present

## 2020-11-29 DIAGNOSIS — I272 Pulmonary hypertension, unspecified: Secondary | ICD-10-CM | POA: Diagnosis not present

## 2020-11-29 DIAGNOSIS — I110 Hypertensive heart disease with heart failure: Secondary | ICD-10-CM | POA: Diagnosis not present

## 2020-11-29 DIAGNOSIS — J439 Emphysema, unspecified: Secondary | ICD-10-CM | POA: Diagnosis not present

## 2020-11-29 DIAGNOSIS — I2510 Atherosclerotic heart disease of native coronary artery without angina pectoris: Secondary | ICD-10-CM | POA: Diagnosis not present

## 2020-11-29 DIAGNOSIS — I480 Paroxysmal atrial fibrillation: Secondary | ICD-10-CM | POA: Diagnosis not present

## 2020-11-29 DIAGNOSIS — E1151 Type 2 diabetes mellitus with diabetic peripheral angiopathy without gangrene: Secondary | ICD-10-CM | POA: Diagnosis not present

## 2020-12-01 DIAGNOSIS — I2510 Atherosclerotic heart disease of native coronary artery without angina pectoris: Secondary | ICD-10-CM | POA: Diagnosis not present

## 2020-12-01 DIAGNOSIS — I110 Hypertensive heart disease with heart failure: Secondary | ICD-10-CM | POA: Diagnosis not present

## 2020-12-01 DIAGNOSIS — I5032 Chronic diastolic (congestive) heart failure: Secondary | ICD-10-CM | POA: Diagnosis not present

## 2020-12-01 DIAGNOSIS — I872 Venous insufficiency (chronic) (peripheral): Secondary | ICD-10-CM | POA: Diagnosis not present

## 2020-12-01 DIAGNOSIS — J439 Emphysema, unspecified: Secondary | ICD-10-CM | POA: Diagnosis not present

## 2020-12-01 DIAGNOSIS — K625 Hemorrhage of anus and rectum: Secondary | ICD-10-CM | POA: Diagnosis not present

## 2020-12-01 DIAGNOSIS — E1151 Type 2 diabetes mellitus with diabetic peripheral angiopathy without gangrene: Secondary | ICD-10-CM | POA: Diagnosis not present

## 2020-12-01 DIAGNOSIS — I272 Pulmonary hypertension, unspecified: Secondary | ICD-10-CM | POA: Diagnosis not present

## 2020-12-01 DIAGNOSIS — I480 Paroxysmal atrial fibrillation: Secondary | ICD-10-CM | POA: Diagnosis not present

## 2020-12-02 ENCOUNTER — Telehealth: Payer: Self-pay | Admitting: *Deleted

## 2020-12-02 DIAGNOSIS — I480 Paroxysmal atrial fibrillation: Secondary | ICD-10-CM | POA: Diagnosis not present

## 2020-12-02 DIAGNOSIS — E1151 Type 2 diabetes mellitus with diabetic peripheral angiopathy without gangrene: Secondary | ICD-10-CM | POA: Diagnosis not present

## 2020-12-02 DIAGNOSIS — I5032 Chronic diastolic (congestive) heart failure: Secondary | ICD-10-CM | POA: Diagnosis not present

## 2020-12-02 DIAGNOSIS — K625 Hemorrhage of anus and rectum: Secondary | ICD-10-CM | POA: Diagnosis not present

## 2020-12-02 DIAGNOSIS — I442 Atrioventricular block, complete: Secondary | ICD-10-CM

## 2020-12-02 DIAGNOSIS — I272 Pulmonary hypertension, unspecified: Secondary | ICD-10-CM | POA: Diagnosis not present

## 2020-12-02 DIAGNOSIS — I2510 Atherosclerotic heart disease of native coronary artery without angina pectoris: Secondary | ICD-10-CM | POA: Diagnosis not present

## 2020-12-02 DIAGNOSIS — J439 Emphysema, unspecified: Secondary | ICD-10-CM | POA: Diagnosis not present

## 2020-12-02 DIAGNOSIS — I110 Hypertensive heart disease with heart failure: Secondary | ICD-10-CM | POA: Diagnosis not present

## 2020-12-02 DIAGNOSIS — I872 Venous insufficiency (chronic) (peripheral): Secondary | ICD-10-CM | POA: Diagnosis not present

## 2020-12-02 NOTE — Chronic Care Management (AMB) (Signed)
  Chronic Care Management   Note  12/02/2020 Name: Reginald [name] MRN: [id number] DOB: 1971-04-08  Reginald [name] is a 50 y.o. year old male who is a primary care patient of [name], MD. I reached out to Reginald [name] by phone today in response to a referral sent by Mr. Ta[place] Tapp [facility] PCP, [name], MD      Mr. Kelty was given information about Chronic Care Management services today including:  CCM service includes personalized support from designated clinical staff supervised by his physician, including individualized plan of care and coordination with other care providers 24/7 contact phone numbers for assistance for urgent and routine care needs. Service will only be billed when office clinical staff spend 20 minutes or more in a month to coordinate care. Only one practitioner may furnish and bill the service in a calendar month. The patient may stop CCM services at any time (effective at the end of the month) by phone call to the office staff. The patient will be responsible for cost sharing (co-pay) of up to 20% of the service fee (after annual deductible is met).  Patient agreed to services and verbal consent obtained.   Follow up plan: Telephone appointment with care management team member scheduled for:12/07/2020  [facility]

## 2020-12-06 DIAGNOSIS — I872 Venous insufficiency (chronic) (peripheral): Secondary | ICD-10-CM | POA: Diagnosis not present

## 2020-12-06 DIAGNOSIS — I5032 Chronic diastolic (congestive) heart failure: Secondary | ICD-10-CM | POA: Diagnosis not present

## 2020-12-06 DIAGNOSIS — E1151 Type 2 diabetes mellitus with diabetic peripheral angiopathy without gangrene: Secondary | ICD-10-CM | POA: Diagnosis not present

## 2020-12-06 DIAGNOSIS — K625 Hemorrhage of anus and rectum: Secondary | ICD-10-CM | POA: Diagnosis not present

## 2020-12-06 DIAGNOSIS — I480 Paroxysmal atrial fibrillation: Secondary | ICD-10-CM | POA: Diagnosis not present

## 2020-12-06 DIAGNOSIS — I272 Pulmonary hypertension, unspecified: Secondary | ICD-10-CM | POA: Diagnosis not present

## 2020-12-06 DIAGNOSIS — I2510 Atherosclerotic heart disease of native coronary artery without angina pectoris: Secondary | ICD-10-CM | POA: Diagnosis not present

## 2020-12-06 DIAGNOSIS — I110 Hypertensive heart disease with heart failure: Secondary | ICD-10-CM | POA: Diagnosis not present

## 2020-12-06 DIAGNOSIS — J439 Emphysema, unspecified: Secondary | ICD-10-CM | POA: Diagnosis not present

## 2020-12-07 ENCOUNTER — Ambulatory Visit (INDEPENDENT_AMBULATORY_CARE_PROVIDER_SITE_OTHER): Payer: Medicare HMO | Admitting: *Deleted

## 2020-12-07 DIAGNOSIS — I5032 Chronic diastolic (congestive) heart failure: Secondary | ICD-10-CM

## 2020-12-07 DIAGNOSIS — I480 Paroxysmal atrial fibrillation: Secondary | ICD-10-CM | POA: Diagnosis not present

## 2020-12-07 DIAGNOSIS — I872 Venous insufficiency (chronic) (peripheral): Secondary | ICD-10-CM | POA: Diagnosis not present

## 2020-12-07 DIAGNOSIS — I272 Pulmonary hypertension, unspecified: Secondary | ICD-10-CM | POA: Diagnosis not present

## 2020-12-07 DIAGNOSIS — I2510 Atherosclerotic heart disease of native coronary artery without angina pectoris: Secondary | ICD-10-CM | POA: Diagnosis not present

## 2020-12-07 DIAGNOSIS — E1151 Type 2 diabetes mellitus with diabetic peripheral angiopathy without gangrene: Secondary | ICD-10-CM | POA: Diagnosis not present

## 2020-12-07 DIAGNOSIS — I110 Hypertensive heart disease with heart failure: Secondary | ICD-10-CM | POA: Diagnosis not present

## 2020-12-07 DIAGNOSIS — J449 Chronic obstructive pulmonary disease, unspecified: Secondary | ICD-10-CM

## 2020-12-07 DIAGNOSIS — K625 Hemorrhage of anus and rectum: Secondary | ICD-10-CM | POA: Diagnosis not present

## 2020-12-07 DIAGNOSIS — J439 Emphysema, unspecified: Secondary | ICD-10-CM | POA: Diagnosis not present

## 2020-12-07 NOTE — Patient Instructions (Signed)
Visit Information   PATIENT GOALS:   Goals Addressed             This Visit's Progress    Track and Manage Fluids and Swelling-Heart Failure       Timeframe:  Long-Range Goal Priority:  High Start Date:               12/07/2020              Expected End Date:     06/09/2021                  Follow Up Date - 01/06/2021   - call office if I gain more than 2 pounds in one day or 5 pounds in one week- follow heart failure action plan, know how you are feeling each day - do ankle pumps when sitting - keep legs up while sitting - track weight in diary - use salt in moderation - watch for swelling in feet, ankles and legs every day - weigh myself daily  - please read over heart failure action plan sent via My Chart   Why is this important?   It is important to check your weight daily and watch how much salt and liquids you have.  It will help you to manage your heart failure.    Notes:      Track and Manage My Symptoms-COPD       Timeframe:  Long-Range Goal Priority:  High Start Date:      12/07/2020                       Expected End Date:     06/09/2021                  Follow Up Date - 01/06/2021   - develop a rescue plan - eliminate symptom triggers at home - follow rescue plan if symptoms flare-up, call doctor early on if not feeling well - keep follow-up appointments  - continue to use CPAP as prescribed - take all medications and use inhalers as prescribed - please read over COPD action plan sent via My Chart - social worker will be contacting you for support/ resources for depression   Why is this important?   Tracking your symptoms and other information about your health helps your doctor plan your care.  Write down the symptoms, the time of day, what you were doing and what medicine you are taking.  You will soon learn how to manage your symptoms.     Notes:         Consent to CCM Services: Reginald [name] was given information about Chronic Care Management  services today including:  CCM service includes personalized support from designated clinical staff supervised by his physician, including individualized plan of care and coordination with other care providers 24/7 contact phone numbers for assistance for urgent and routine care needs. Service will only be billed when office clinical staff spend 20 minutes or more in a month to coordinate care. Only one practitioner may furnish and bill the service in a calendar month. The patient may stop CCM services at any time (effective at the end of the month) by phone call to the office staff. The patient will be responsible for cost sharing (co-pay) of up to 20% of the service fee (after annual deductible is met).  Patient agreed to services and verbal consent obtained.   Patient verbalizes understanding of instructions provided today  and agrees to view in [place].   Telephone follow up appointment with care management team member scheduled for:   8/18/2022COPD Action Plan A COPD action plan is a description of what to do when you have a flare (exacerbation) of chronic obstructive pulmonary disease (COPD). Your action plan is a color-coded plan that lists the symptoms that indicate whether your condition is under control and what actions to take. If you have symptoms in the green zone, it means you are doing well that day. If you have symptoms in the yellow zone, it means you are having a bad day or an exacerbation. If you have symptoms in the red zone, you need urgent medical care. Follow the plan that you and your health care provider developed. Review yourplan with your health care provider at each visit. Red zone Symptoms in this zone mean that you should get medical help right away. They include: Feeling very short of breath, even when you are resting. Not being able to do any activities because of poor breathing. Not being able to sleep because of poor breathing. Fever or shaking chills. Feeling  confused or very sleepy. Chest pain. Coughing up blood. If you have any of these symptoms, call emergency services (911 in the [place] or go to the nearest emergency room. Yellow zone Symptoms in this zone mean that your condition may be getting worse. They include: Feeling more short of breath than usual. Having less energy for daily activities than usual. Phlegm or mucus that is thicker than usual. Needing to use your rescue inhaler or nebulizer more often than usual. More ankle swelling than usual. Coughing more than usual. Feeling like you have a chest cold. Trouble sleeping due to COPD symptoms. Decreased appetite. COPD medicines not helping as much as usual. If you experience any "yellow" symptoms: Keep taking your daily medicines as directed. Use your quick-relief inhaler as told by your health care provider. If you were prescribed steroid medicine to take by mouth (oral medicine), start taking it as told by your health care provider. If you were prescribed an antibiotic medicine, start taking it as told by your health care provider. Do not stop taking the antibiotic even if you start to feel better. Use oxygen as told by your health care provider. Get more rest. Do your pursed-lip breathing exercises. Do not smoke. Avoid any irritants in the air. If your signs and symptoms do not improve after taking these steps, call yourhealth care provider right away. Green zone Symptoms in this zone mean that you are doing well. They include: Being able to do your usual activities and exercise. Having the usual amount of coughing, including the same amount of phlegm or mucus. Being able to sleep well. Having a good appetite. Where to find more information: You can find more information about COPD from: American Lung Association, My COPD Action Plan: www.lung.org COPD Foundation: www.copdfoundation.[place]: https://wilson-eaton.com/ Follow these instructions  at home: Continue taking your daily medicines as told by your health care provider. Make sure you receive all the immunizations that your health care provider recommends, especially the pneumococcal and influenza vaccines. Wash your hands often with soap and water. Have family members wash their hands too. Regular hand washing can help prevent infections. Follow your usual exercise and diet plan. Avoid irritants in the air, such as smoke. Do not use any products that contain nicotine or tobacco. These products include cigarettes, chewing tobacco, and vaping devices, such as e-cigarettes. If  you need help quitting, ask your health care provider. Summary A COPD action plan tells you what to do when you have a flare (exacerbation) of chronic obstructive pulmonary disease (COPD). Follow each action plan for your symptoms. If you have any symptoms in the red zone, call emergency services (911 in the [place] or go to the nearest emergency room. This information is not intended to replace advice given to you by your health care provider. Make sure you discuss any questions you have with your healthcare provider. Document Revised: 03/16/2020 Document Reviewed: 03/16/2020 Elsevier Patient Education  2022 [place]. Heart Failure Action Plan A heart failure action plan helps you understand what to do when you have symptoms of heart failure. Your action plan is a color-coded plan that lists the symptoms to watch for and indicates what actions to take. If you have symptoms in the red zone, you need medical care right away. If you have symptoms in the yellow zone, you are having problems. If you have symptoms in the green zone, you are doing well. Follow the plan that was created by you and your health care provider. Reviewyour plan each time you visit your health care provider. Red zone These signs and symptoms mean you should get medical help right away: You have trouble breathing when resting. You have  a dry cough that is getting worse. You have swelling or pain in your legs or abdomen that is getting worse. You suddenly gain more than 2-3 lb (0.9-1.4 kg) in 24 hours, or more than 5 lb (2.3 kg) in a week. This amount may be more or less depending on your condition. You have trouble staying awake or you feel confused. You have chest pain. You do not have an appetite. You pass out. You have worsening sadness or depression. If you have any of these symptoms, call your local emergency services (911 in the [place] right away. Do not drive yourself to the hospital. Yellow zone These signs and symptoms mean your condition may be getting worse and you should make some changes: You have trouble breathing when you are active, or you need to sleep with your head raised on extra pillows to help you breathe. You have swelling in your legs or abdomen. You gain 2-3 lb (0.9-1.4 kg) in 24 hours, or 5 lb (2.3 kg) in a week. This amount may be more or less depending on your condition. You get tired easily. You have trouble sleeping. You have a dry cough. If you have any of these symptoms: Contact your health care provider within the next day. Your health care provider may adjust your medicines. Green zone These signs mean you are doing well and can continue what you are doing: You do not have shortness of breath. You have very little swelling or no new swelling. Your weight is stable (no gain or loss). You have a normal activity level. You do not have chest pain or any other new symptoms. Follow these instructions at home: Take over-the-counter and prescription medicines only as told by your health care provider. Weigh yourself daily. Your target weight is __________ lb (__________ kg). Call your health care provider if you gain more than __________ lb (__________ kg) in 24 hours, or more than __________ lb (__________ kg) in a week. Health care provider name:  _____________________________________________________ Health care provider phone number: _____________________________________________________ Eat a heart-healthy diet. Work with a diet and nutrition specialist (dietitian) to create an eating plan that is best for you. Keep all  follow-up visits. This is important. Where to find more information American Heart Association: www.heart.org Summary A heart failure action plan helps you understand what to do when you have symptoms of heart failure. Follow the action plan that was created by you and your health care provider. Get help right away if you have any symptoms in the red zone. This information is not intended to replace advice given to you by your health care provider. Make sure you discuss any questions you have with your healthcare provider. Document Revised: 12/22/2019 Document Reviewed: 12/22/2019 Elsevier Patient Education  2022 [facility], BSN RN Case Manager [facility] [phone]   CLINICAL CARE PLAN: Patient Care Plan: Heart Failure (Adult)     Problem Identified: Symptom Exacerbation (Heart Failure)   Priority: High     Long-Range Goal: Symptom Exacerbation Prevented or Minimized   Start Date: 12/07/2020  Expected End Date: 06/09/2021  This Visit's Progress: On track  Priority: High  Note:   Current Barriers:  Knowledge deficit related to basic heart failure pathophysiology and self care management- needs reinforcement for HF action plan, symptom management Does not adhere to provider recommendations re: low sodium diet, sometimes eats fast food, does not exercise, feels he is not able, stays tired, reports working with [facility] RN/ PT/ OT and home health services will be ending soon, pt reports he checks CBG once daily with most recent [facility] 5.8, checks blood pressure on occasion, has had no further GI bleeding (pt states it was due to hemorrhoids) Lacks social  connections- pt lives alone, has a friend he can call if needed, does not communicate much with his family, gets out and drives to the grocery store and flea market to talk with friends. Case Manager Clinical Goal(s):  patient will weigh self daily and record patient will verbalize understanding of Heart Failure Action Plan and when to call doctor patient will take all Heart Failure mediations as prescribed patient will weigh daily and record (notifying MD of 3 lb weight gain over night or 5 lb in a week) Interventions:  Collaboration with [name], MD regarding development and update of comprehensive plan of care as evidenced by provider attestation and co-signature Inter-disciplinary care team collaboration (see longitudinal plan of care) Basic overview and discussion of pathophysiology of Heart Failure reviewed  Provided verbal education on low sodium diet Reviewed Heart Failure Action Plan in depth and provided written copy via My Chart Assessed need for readable accurate scales in home (pt does have scales) Provided education about placing scale on hard, flat surface Advised patient to weigh each morning after emptying bladder Discussed importance of daily weight and advised patient to weigh and record daily Reviewed role of diuretics in prevention of fluid overload and management of heart failure Reviewed importance of taking all medications, using inhalers as prescribed Referral sent for LCSW services related to depression Encouraged patient to stay active, continue to work with home health RN/ PT/ OT,  try to connect with friends and be as involved with others, community as possible Reviewed all upcoming provider appointments- cardiologist 8/24,  primary care provider 9/20, annual wellness visit 10/24 Self-Care Activities: Attends all scheduled provider appointments Attends church or other social activities Calls provider office for new concerns or questions Patient Goals: -  call office if I gain more than 3 pounds in one day or 5 pounds in one week- follow heart failure action plan, know how you are feeling each day -  do ankle pumps when sitting - keep legs up while sitting - track weight in diary - use salt in moderation - watch for swelling in feet, ankles and legs every day - weigh myself daily  - please read over heart failure action plan sent via My Chart Follow Up Plan: Telephone follow up appointment with care management team member scheduled for:     Patient Care Plan: COPD (Adult)     Problem Identified: Symptom Exacerbation (COPD)   Priority: High     Long-Range Goal: Symptom Exacerbation Prevented or Minimized   Start Date: 12/07/2020  Expected End Date: 06/09/2021  This Visit's Progress: On track  Priority: High  Note:   Current Barriers:  Knowledge deficits related to basic understanding of COPD disease process- needs reinforcement of COPD action plan, symptom management, pt uses CPAP, checks 02 sat as needed Knowledge deficits related to basic COPD self care/management- does not have advanced directives, declined information Knowledge deficit related to importance of energy conservation- pt gets tired easily, does not always sleep well, can benefit from light exercise, is working with home health PT Lacks social connections - lives alone, has friend he can call on, has depression PHQ-9=14, requests referral for LCSW for resources/ counseling, pt reports he tried outpatient counseling in the past and "it did not work very well" Case Electrical engineer): patient will report using inhalers as prescribed including rinsing mouth after use patient will verbalize understanding of COPD action plan and when to seek appropriate levels of medical care patient will engage in lite exercise as tolerated to build/regain stamina and strength and reduce shortness of breath through activity tolerance patient will verbalize basic understanding of COPD  disease process and self care activities  Interventions:  Collaboration with [name], MD regarding development and update of comprehensive plan of care as evidenced by provider attestation and co-signature Inter-disciplinary care team collaboration (see longitudinal plan of care) Provided patient with basic written (via My Chart) and verbal COPD education on self care/management/and exacerbation prevention  Provided instruction about proper use of medications used for management of COPD including inhalers Advised patient to self assesses COPD action plan zone and make appointment with provider if in the yellow zone for 48 hours without improvement. Provided patient with education about the role of exercise in the management of COPD- ask pt to continue to work with home health PT and complete prescribed exercises Advised patient to engage in light exercise as tolerated 3-5 days a week and practice energy conservation Encouraged pt to limit day time naps Encouraged pt to continue meeting with friends at [place] and connecting with others to help with depression Referral placed for LCSW for depression management Self-Care Activities:  Attends all scheduled provider appointments Attends church or other social activities Calls provider office for new concerns or questions Patient Goals: - develop a rescue plan - eliminate symptom triggers at home - follow rescue plan if symptoms flare-up, call doctor early on if not feeling well - keep follow-up appointments  - continue to use CPAP as prescribed - take all medications and use inhalers as prescribed - please read over COPD action plan sent via My Chart - social worker will be contacting you for support/ resources for depression - develop a new routine to improve sleep - get at least 7 to 8 hours of sleep at night - get outdoors every day (weather permitting) - keep room cool and dark Follow Up Plan: Telephone follow up appointment  with  care management team member scheduled for:  01/06/2021

## 2020-12-07 NOTE — Chronic Care Management (AMB) (Signed)
Chronic Care Management   CCM RN Visit Note  12/07/2020 Name: Reginald [name] MRN: [id number] DOB: Nov 13, 1970  Subjective: Reginald [name] is a 50 y.o. year old male who is a primary care patient of [name], MD. The care management team was consulted for assistance with disease management and care coordination needs.    Engaged with patient by telephone for initial visit in response to provider referral for case management and/or care coordination services.   Consent to Services:  The patient was given the following information about Chronic Care Management services today, agreed to services, and gave verbal consent: 1. CCM service includes personalized support from designated clinical staff supervised by the primary care provider, including individualized plan of care and coordination with other care providers 2. 24/7 contact phone numbers for assistance for urgent and routine care needs. 3. Service will only be billed when office clinical staff spend 20 minutes or more in a month to coordinate care. 4. Only one practitioner may furnish and bill the service in a calendar month. 5.The patient may stop CCM services at any time (effective at the end of the month) by phone call to the office staff. 6. The patient will be responsible for cost sharing (co-pay) of up to 20% of the service fee (after annual deductible is met). Patient agreed to services and consent obtained.  Patient agreed to services and verbal consent obtained.   Assessment: Review of patient past medical history, allergies, medications, health status, including review of consultants reports, laboratory and other test data, was performed as part of comprehensive evaluation and provision of chronic care management services.   SDOH (Social Determinants of Health) assessments and interventions performed:  SDOH Interventions    Flowsheet Row Most Recent Value  SDOH Interventions   Food Insecurity Interventions Intervention Not  Indicated  Housing Interventions Intervention Not Indicated  Transportation Interventions Intervention Not Indicated  Depression Interventions/Treatment  Medication        CCM Care Plan  No Known Allergies  Outpatient Encounter Medications as of 12/07/2020  Medication Sig   acetaminophen (TYLENOL) 325 MG tablet Take 650 mg by mouth every 6 (six) hours as needed for mild pain.   albuterol (PROVENTIL) (2.5 MG/3ML) 0.083% nebulizer solution INHALE 1 VIAL VIA NEBULIZER EVERY 6 HOURS AS NEEDED FOR WHEEZING OR SHORTNESS OF BREATH   albuterol (VENTOLIN HFA) 108 (90 Base) MCG/ACT inhaler Inhale 2 puffs into the lungs every 6 (six) hours as needed for wheezing or shortness of breath.   allopurinol (ZYLOPRIM) 300 MG tablet TAKE 1 TABLET EVERY DAY   ammonium lactate (AMLACTIN) 12 % lotion Apply 1 application topically as needed for dry skin.   atorvastatin (LIPITOR) 40 MG tablet TAKE 1 TABLET EVERY DAY   Blood Glucose Monitoring Suppl (TRUE METRIX METER) w/Device KIT USE AS DIRECTED   busPIRone (BUSPAR) 10 MG tablet TAKE 1 TABLET THREE TIMES DAILY   citalopram (CELEXA) 40 MG tablet TAKE 1 TABLET EVERY DAY   diclofenac Sodium (VOLTAREN) 1 % GEL Apply 2 g topically 4 (four) times daily.   docusate sodium (COLACE) 100 MG capsule Take 2 capsules (200 mg total) by mouth 2 (two) times daily.   fexofenadine (ALLEGRA) 180 MG tablet Take 180 mg by mouth daily.   gabapentin (NEURONTIN) 100 MG capsule Take 1 capsule in the morning and 2 capsules in the evening.   glipiZIDE (GLUCOTROL XL) 5 MG 24 hr tablet Take 1 tablet (5 mg total) by mouth daily with breakfast.  Iron, Ferrous Sulfate, 325 (65 Fe) MG TABS Take 325 mg by mouth daily.   Krill Oil 350 MG CAPS Take 1 capsule by mouth in the morning and at bedtime.    loratadine (CLARITIN) 10 MG tablet Take 10 mg by mouth daily as needed for allergies.   metFORMIN (GLUCOPHAGE) 850 MG tablet Take 1 tablet (850 mg total) by mouth 2 (two) times daily with a meal.    metolazone (ZAROXOLYN) 5 MG tablet PRN   metoprolol tartrate (LOPRESSOR) 100 MG tablet Take 1 tablet (100 mg total) by mouth 2 (two) times daily.   mupirocin ointment (BACTROBAN) 2 % Apply 1 application topically 2 (two) times daily.   pantoprazole (PROTONIX) 40 MG tablet Take 1 tablet (40 mg total) by mouth daily.   polyethylene glycol (MIRALAX) 17 g packet Take 17 g by mouth daily as needed.   potassium chloride SA (KLOR-CON) 20 MEQ tablet TAKE 3 TABS DAILY AND TAKE AN ADDITIONAL 1 TAB ON THE DAY YOU TAKE YOUR WEEKLY METOLAZONE DOSE   rivaroxaban (XARELTO) 20 MG TABS tablet TAKE 1 Tablet BY MOUTH ONCE EVERY DAY WITH SUPPER   Semaglutide (RYBELSUS) 7 MG TABS Take 1 tablet by mouth daily before breakfast.   terbinafine (LAMISIL) 250 MG tablet Take 1 tablet (250 mg total) by mouth daily.   TRUE METRIX BLOOD GLUCOSE TEST test strip TEST ONE TIME DAILY FOR DIABETES   TRUEplus Lancets 33G MISC USE TO TEST ONE TIME DAILY FOR DIABETES   lisinopril (ZESTRIL) 20 MG tablet Take 1 tablet (20 mg total) by mouth daily.   torsemide (DEMADEX) 20 MG tablet Take 2 tablets (40 mg total) by mouth 2 (two) times daily.   No facility-administered encounter medications on file as of 12/07/2020.    Patient Active Problem List   Diagnosis Date Noted   Anemia    Rectal bleeding    Heme positive stool    Anticoagulated    Chronic blood loss anemia 11/12/2020   Gastrointestinal hemorrhage 10/08/2020   S/P placement of cardiac pacemaker 02/10/2020   Encounter for examination following treatment at hospital 02/10/2020   Anxiety 02/10/2020   DM2 (diabetes mellitus, type 2) ([place]) 02/10/2020   OSA on CPAP    Abnormal liver function 03/16/2019   History of pulmonary embolism 03/16/2019   Paroxysmal atrial fibrillation ([place]) 09/05/2018   COPD (chronic obstructive pulmonary disease) ([place]) 05/07/2018   Gastroesophageal reflux disease    Pulmonary embolism ([place]) 09/18/2017   Encounter for therapeutic drug  monitoring 09/04/2017   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 08/23/2017   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   Pulmonary hypertension ([place])    Chronic periodontitis 07/18/2017   Morbid obesity ([place])    Essential hypertension    Tobacco abuse    Chronic heart failure with preserved ejection fraction (HFpEF) ([place])    Chronic venous insufficiency     Conditions to be addressed/monitored:CHF and COPD  Care Plan : Heart Failure (Adult)  Updates made by Kassie Mends, RN since 12/07/2020 12:00 AM     Problem: Symptom Exacerbation (Heart Failure)   Priority: High     Long-Range Goal: Symptom Exacerbation Prevented or Minimized   Start Date: 12/07/2020  Expected End Date: 06/09/2021  This Visit's Progress: On track  Priority: High  Note:   Current Barriers:  Knowledge deficit related to basic heart failure pathophysiology and self care management- needs reinforcement for HF action plan, symptom management Does not adhere to  provider recommendations re: low sodium diet, sometimes eats fast food, does not exercise, feels he is not able, stays tired, reports working with [facility] RN/ PT/ OT and home health services will be ending soon, pt reports he checks CBG once daily with most recent AIC 5.8, checks blood pressure on occasion, has had no further GI bleeding (pt states it was due to hemorrhoids) Lacks social connections- pt lives alone, has a friend he can call if needed, does not communicate much with his family, gets out and drives to the grocery store and flea market to talk with friends. Case Manager Clinical Goal(s):  patient will weigh self daily and record patient will verbalize understanding of Heart Failure Action Plan and when to call doctor patient will take all Heart Failure mediations as prescribed patient will weigh daily and record (notifying MD of 3 lb weight gain over night or 5 lb in a  week) Interventions:  Collaboration with [name], MD regarding development and update of comprehensive plan of care as evidenced by provider attestation and co-signature Inter-disciplinary care team collaboration (see longitudinal plan of care) Basic overview and discussion of pathophysiology of Heart Failure reviewed  Provided verbal education on low sodium diet Reviewed Heart Failure Action Plan in depth and provided written copy via My Chart Assessed need for readable accurate scales in home (pt does have scales) Provided education about placing scale on hard, flat surface Advised patient to weigh each morning after emptying bladder Discussed importance of daily weight and advised patient to weigh and record daily Reviewed role of diuretics in prevention of fluid overload and management of heart failure Reviewed importance of taking all medications, using inhalers as prescribed Referral sent for LCSW services related to depression Encouraged patient to stay active, continue to work with home health RN/ PT/ OT,  try to connect with friends and be as involved with others, community as possible Reviewed all upcoming provider appointments- cardiologist 8/24,  primary care provider 9/20, annual wellness visit 10/24 Self-Care Activities: Attends all scheduled provider appointments Attends church or other social activities Calls provider office for new concerns or questions Patient Goals: - call office if I gain more than 3 pounds in one day or 5 pounds in one week- follow heart failure action plan, know how you are feeling each day - do ankle pumps when sitting - keep legs up while sitting - track weight in diary - use salt in moderation - watch for swelling in feet, ankles and legs every day - weigh myself daily  - please read over heart failure action plan sent via My Chart Follow Up Plan: Telephone follow up appointment with care management team member scheduled for:     Care  Plan : COPD (Adult)  Updates made by Kassie Mends, RN since 12/07/2020 12:00 AM     Problem: Symptom Exacerbation (COPD)   Priority: High     Long-Range Goal: Symptom Exacerbation Prevented or Minimized   Start Date: 12/07/2020  Expected End Date: 06/09/2021  This Visit's Progress: On track  Priority: High  Note:   Current Barriers:  Knowledge deficits related to basic understanding of COPD disease process- needs reinforcement of COPD action plan, symptom management, pt uses CPAP, checks 02 sat as needed Knowledge deficits related to basic COPD self care/management- does not have advanced directives, declined information Knowledge deficit related to importance of energy conservation- pt gets tired easily, does not always sleep well, can benefit from light exercise, is working with  home health PT Lacks social connections - lives alone, has friend he can call on, has depression PHQ-9=14, requests referral for LCSW for resources/ counseling, pt reports he tried outpatient counseling in the past and "it did not work very well" Case Electrical engineer): patient will report using inhalers as prescribed including rinsing mouth after use patient will verbalize understanding of COPD action plan and when to seek appropriate levels of medical care patient will engage in lite exercise as tolerated to build/regain stamina and strength and reduce shortness of breath through activity tolerance patient will verbalize basic understanding of COPD disease process and self care activities  Interventions:  Collaboration with [name], MD regarding development and update of comprehensive plan of care as evidenced by provider attestation and co-signature Inter-disciplinary care team collaboration (see longitudinal plan of care) Provided patient with basic written (via My Chart) and verbal COPD education on self care/management/and exacerbation prevention  Provided instruction about proper use of  medications used for management of COPD including inhalers Advised patient to self assesses COPD action plan zone and make appointment with provider if in the yellow zone for 48 hours without improvement. Provided patient with education about the role of exercise in the management of COPD- ask pt to continue to work with home health PT and complete prescribed exercises Advised patient to engage in light exercise as tolerated 3-5 days a week and practice energy conservation Encouraged pt to limit day time naps Encouraged pt to continue meeting with friends at [place] and connecting with others to help with depression Referral placed for LCSW for depression management Self-Care Activities:  Attends all scheduled provider appointments Attends church or other social activities Calls provider office for new concerns or questions Patient Goals: - develop a rescue plan - eliminate symptom triggers at home - follow rescue plan if symptoms flare-up, call doctor early on if not feeling well - keep follow-up appointments  - continue to use CPAP as prescribed - take all medications and use inhalers as prescribed - please read over COPD action plan sent via My Chart - social worker will be contacting you for support/ resources for depression - develop a new routine to improve sleep - get at least 7 to 8 hours of sleep at night - get outdoors every day (weather permitting) - keep room cool and dark Follow Up Plan: Telephone follow up appointment with care management team member scheduled for:  01/06/2021      Plan:Telephone follow up appointment with care management team member scheduled for:  01/06/2021  Jacqlyn Larsen [facility], BSN RN Case Manager [facility] [phone]

## 2020-12-08 ENCOUNTER — Telehealth: Payer: Self-pay | Admitting: *Deleted

## 2020-12-08 DIAGNOSIS — I480 Paroxysmal atrial fibrillation: Secondary | ICD-10-CM | POA: Diagnosis not present

## 2020-12-08 DIAGNOSIS — J439 Emphysema, unspecified: Secondary | ICD-10-CM | POA: Diagnosis not present

## 2020-12-08 DIAGNOSIS — I5032 Chronic diastolic (congestive) heart failure: Secondary | ICD-10-CM | POA: Diagnosis not present

## 2020-12-08 DIAGNOSIS — K625 Hemorrhage of anus and rectum: Secondary | ICD-10-CM | POA: Diagnosis not present

## 2020-12-08 DIAGNOSIS — I2510 Atherosclerotic heart disease of native coronary artery without angina pectoris: Secondary | ICD-10-CM | POA: Diagnosis not present

## 2020-12-08 DIAGNOSIS — I110 Hypertensive heart disease with heart failure: Secondary | ICD-10-CM | POA: Diagnosis not present

## 2020-12-08 DIAGNOSIS — I872 Venous insufficiency (chronic) (peripheral): Secondary | ICD-10-CM | POA: Diagnosis not present

## 2020-12-08 DIAGNOSIS — E1151 Type 2 diabetes mellitus with diabetic peripheral angiopathy without gangrene: Secondary | ICD-10-CM | POA: Diagnosis not present

## 2020-12-08 DIAGNOSIS — I272 Pulmonary hypertension, unspecified: Secondary | ICD-10-CM | POA: Diagnosis not present

## 2020-12-08 NOTE — Chronic Care Management (AMB) (Signed)
  Chronic Care Management   Note  12/08/2020 Name: ARAEL PICCIONE MRN: [id number] DOB: 08-28-1970  [name] is a 50 y.o. year old male who is a primary care patient of Posey Pronto, Colin Broach, MD. IMANI SHERRIN is currently enrolled in care management services. An additional referral for Social Work was placed.   Follow up plan: Telephone appointment with care management team member scheduled for:12/21/2020  Zaryiah Barz  Care Guide, Embedded Care Coordination [place]  Care Management

## 2020-12-09 ENCOUNTER — Other Ambulatory Visit: Payer: Self-pay | Admitting: Internal Medicine

## 2020-12-09 DIAGNOSIS — J449 Chronic obstructive pulmonary disease, unspecified: Secondary | ICD-10-CM

## 2020-12-13 DIAGNOSIS — I2510 Atherosclerotic heart disease of native coronary artery without angina pectoris: Secondary | ICD-10-CM | POA: Diagnosis not present

## 2020-12-13 DIAGNOSIS — J439 Emphysema, unspecified: Secondary | ICD-10-CM | POA: Diagnosis not present

## 2020-12-13 DIAGNOSIS — I480 Paroxysmal atrial fibrillation: Secondary | ICD-10-CM | POA: Diagnosis not present

## 2020-12-13 DIAGNOSIS — I872 Venous insufficiency (chronic) (peripheral): Secondary | ICD-10-CM | POA: Diagnosis not present

## 2020-12-13 DIAGNOSIS — I110 Hypertensive heart disease with heart failure: Secondary | ICD-10-CM | POA: Diagnosis not present

## 2020-12-13 DIAGNOSIS — I272 Pulmonary hypertension, unspecified: Secondary | ICD-10-CM | POA: Diagnosis not present

## 2020-12-13 DIAGNOSIS — I5032 Chronic diastolic (congestive) heart failure: Secondary | ICD-10-CM | POA: Diagnosis not present

## 2020-12-13 DIAGNOSIS — E1151 Type 2 diabetes mellitus with diabetic peripheral angiopathy without gangrene: Secondary | ICD-10-CM | POA: Diagnosis not present

## 2020-12-13 DIAGNOSIS — K625 Hemorrhage of anus and rectum: Secondary | ICD-10-CM | POA: Diagnosis not present

## 2020-12-14 DIAGNOSIS — G4733 Obstructive sleep apnea (adult) (pediatric): Secondary | ICD-10-CM

## 2020-12-14 DIAGNOSIS — I2510 Atherosclerotic heart disease of native coronary artery without angina pectoris: Secondary | ICD-10-CM | POA: Diagnosis not present

## 2020-12-14 DIAGNOSIS — J439 Emphysema, unspecified: Secondary | ICD-10-CM | POA: Diagnosis not present

## 2020-12-14 DIAGNOSIS — I5032 Chronic diastolic (congestive) heart failure: Secondary | ICD-10-CM | POA: Diagnosis not present

## 2020-12-14 DIAGNOSIS — I442 Atrioventricular block, complete: Secondary | ICD-10-CM

## 2020-12-14 DIAGNOSIS — M19011 Primary osteoarthritis, right shoulder: Secondary | ICD-10-CM

## 2020-12-14 DIAGNOSIS — E1151 Type 2 diabetes mellitus with diabetic peripheral angiopathy without gangrene: Secondary | ICD-10-CM | POA: Diagnosis not present

## 2020-12-14 DIAGNOSIS — I480 Paroxysmal atrial fibrillation: Secondary | ICD-10-CM | POA: Diagnosis not present

## 2020-12-14 DIAGNOSIS — I872 Venous insufficiency (chronic) (peripheral): Secondary | ICD-10-CM | POA: Diagnosis not present

## 2020-12-14 DIAGNOSIS — I110 Hypertensive heart disease with heart failure: Secondary | ICD-10-CM | POA: Diagnosis not present

## 2020-12-14 DIAGNOSIS — I700 Atherosclerosis of aorta: Secondary | ICD-10-CM

## 2020-12-14 DIAGNOSIS — I4510 Unspecified right bundle-branch block: Secondary | ICD-10-CM

## 2020-12-14 DIAGNOSIS — I272 Pulmonary hypertension, unspecified: Secondary | ICD-10-CM | POA: Diagnosis not present

## 2020-12-14 DIAGNOSIS — K625 Hemorrhage of anus and rectum: Secondary | ICD-10-CM | POA: Diagnosis not present

## 2020-12-14 DIAGNOSIS — F32A Depression, unspecified: Secondary | ICD-10-CM

## 2020-12-14 DIAGNOSIS — F419 Anxiety disorder, unspecified: Secondary | ICD-10-CM

## 2020-12-15 ENCOUNTER — Telehealth: Payer: Self-pay | Admitting: Cardiovascular Disease

## 2020-12-15 DIAGNOSIS — E1151 Type 2 diabetes mellitus with diabetic peripheral angiopathy without gangrene: Secondary | ICD-10-CM | POA: Diagnosis not present

## 2020-12-15 DIAGNOSIS — K625 Hemorrhage of anus and rectum: Secondary | ICD-10-CM | POA: Diagnosis not present

## 2020-12-15 DIAGNOSIS — I5032 Chronic diastolic (congestive) heart failure: Secondary | ICD-10-CM | POA: Diagnosis not present

## 2020-12-15 DIAGNOSIS — I2510 Atherosclerotic heart disease of native coronary artery without angina pectoris: Secondary | ICD-10-CM | POA: Diagnosis not present

## 2020-12-15 DIAGNOSIS — I272 Pulmonary hypertension, unspecified: Secondary | ICD-10-CM | POA: Diagnosis not present

## 2020-12-15 DIAGNOSIS — R609 Edema, unspecified: Secondary | ICD-10-CM

## 2020-12-15 DIAGNOSIS — J439 Emphysema, unspecified: Secondary | ICD-10-CM | POA: Diagnosis not present

## 2020-12-15 DIAGNOSIS — I480 Paroxysmal atrial fibrillation: Secondary | ICD-10-CM | POA: Diagnosis not present

## 2020-12-15 DIAGNOSIS — I110 Hypertensive heart disease with heart failure: Secondary | ICD-10-CM | POA: Diagnosis not present

## 2020-12-15 DIAGNOSIS — I872 Venous insufficiency (chronic) (peripheral): Secondary | ICD-10-CM | POA: Diagnosis not present

## 2020-12-15 NOTE — Telephone Encounter (Signed)
Spoke to pt who verbalized understanding of plan. Pt will come in for lab work.

## 2020-12-15 NOTE — Telephone Encounter (Signed)
I will forward to [place] for dispo

## 2020-12-15 NOTE — Telephone Encounter (Signed)
Pt c/o swelling: STAT is pt has developed SOB within 24 hours  If swelling, where is the swelling located? No pitting edema anywhere, edema in lower legs  How much weight have you gained and in what time span? 6 lbs in one day, from yesterday to today  Have you gained 3 pounds in a day or 5 pounds in a week? Yes  Do you have a log of your daily weights (if so, list)?  12/14/20 470 lbs 12/15/20 476 lbs  Are you currently taking a fluid pill? Yes  Are you currently SOB? No, only when moving around, but has worsened with edema   Have you traveled recently? N/A  Reports he took metolazone 5 MG's due to being advised he can take it as needed.

## 2020-12-15 NOTE — Telephone Encounter (Signed)
Pt sees Dr. Johnsie Cancel in [place].  Will route to [facility] triage pool.

## 2020-12-16 NOTE — Progress Notes (Signed)
Remote pacemaker transmission.   

## 2020-12-20 ENCOUNTER — Encounter: Payer: Self-pay | Admitting: Internal Medicine

## 2020-12-20 ENCOUNTER — Other Ambulatory Visit (HOSPITAL_COMMUNITY)
Admission: RE | Admit: 2020-12-20 | Discharge: 2020-12-20 | Disposition: A | Discharge status: Home / Self Care | Payer: Medicare HMO | Source: Ambulatory Visit | Attending: Cardiovascular Disease | Admitting: Cardiovascular Disease

## 2020-12-20 DIAGNOSIS — R600 Localized edema: Secondary | ICD-10-CM | POA: Insufficient documentation

## 2020-12-20 DIAGNOSIS — Z79899 Other long term (current) drug therapy: Secondary | ICD-10-CM

## 2020-12-20 DIAGNOSIS — R609 Edema, unspecified: Secondary | ICD-10-CM

## 2020-12-20 LAB — BASIC METABOLIC PANEL
Anion gap: 11 (ref 5–15)
BUN: 58 mg/dL — ABNORMAL HIGH (ref 6–20)
CO2: 31 mmol/L (ref 22–32)
Calcium: 9.5 mg/dL (ref 8.9–10.3)
Chloride: 93 mmol/L — ABNORMAL LOW (ref 98–111)
Creatinine, Ser: 2.28 mg/dL — ABNORMAL HIGH (ref 0.61–1.24)
GFR, Estimated: 34 mL/min — ABNORMAL LOW (ref >60)
Glucose, Bld: 175 mg/dL — ABNORMAL HIGH (ref 70–99)
Potassium: 4.4 mmol/L (ref 3.5–5.1)
Sodium: 135 mmol/L (ref 135–145)

## 2020-12-20 LAB — BRAIN NATRIURETIC PEPTIDE: B Natriuretic Peptide: 62 pg/mL (ref 0.0–100.0)

## 2020-12-21 ENCOUNTER — Other Ambulatory Visit: Payer: Self-pay | Admitting: Internal Medicine

## 2020-12-21 ENCOUNTER — Ambulatory Visit (INDEPENDENT_AMBULATORY_CARE_PROVIDER_SITE_OTHER): Payer: Medicare HMO | Admitting: *Deleted

## 2020-12-21 DIAGNOSIS — Z79899 Other long term (current) drug therapy: Secondary | ICD-10-CM

## 2020-12-21 DIAGNOSIS — B351 Tinea unguium: Secondary | ICD-10-CM

## 2020-12-21 DIAGNOSIS — J449 Chronic obstructive pulmonary disease, unspecified: Secondary | ICD-10-CM

## 2020-12-21 DIAGNOSIS — I5032 Chronic diastolic (congestive) heart failure: Secondary | ICD-10-CM

## 2020-12-21 DIAGNOSIS — F339 Major depressive disorder, recurrent, unspecified: Secondary | ICD-10-CM

## 2020-12-21 MED ORDER — TORSEMIDE 20 MG PO TABS: ORAL_TABLET | ORAL | 11 refills | Status: DC

## 2020-12-21 NOTE — Patient Instructions (Signed)
Visit Information   PATIENT GOALS:   Goals Addressed               This Visit's Progress     Reduce symptoms of depression (pt-stated)        Timeframe:  Long-Range Goal Priority:  High Start Date:       12/21/20                      Expected End Date:        02/17/21                      Follow Up Date 01/14/21   -discuss medication adjustment/options with PCP and Pharmacist at PCP office - avoid negative self-talk - develop a personal safety plan - develop a plan to deal with triggers like holidays, anniversaries - have a plan for how to handle bad days - spend time or talk with others at least 2 to 3 times per week - spend time or talk with others every day - watch for early signs of feeling worse    Why is this important?   Keeping track of your progress will help your treatment team find the right mix of medicine and therapy for you.  Write in your journal every day.  Day-to-day changes in depression symptoms are normal. It may be more helpful to check your progress at the end of each week instead of every day.     Notes:         Consent to CCM Services: Mr. Gebhard was given information about Chronic Care Management services today including:  CCM service includes personalized support from designated clinical staff supervised by his physician, including individualized plan of care and coordination with other care providers 24/7 contact phone numbers for assistance for urgent and routine care needs. Service will only be billed when office clinical staff spend 20 minutes or more in a month to coordinate care. Only one practitioner may furnish and bill the service in a calendar month. The patient may stop CCM services at any time (effective at the end of the month) by phone call to the office staff. The patient will be responsible for cost sharing (co-pay) of up to 20% of the service fee (after annual deductible is met).  Patient agreed to services and verbal consent  obtained.   The patient verbalized understanding of instructions, educational materials, and care plan provided today and declined offer to receive copy of patient instructions, educational materials, and care plan.   Telephone follow up appointment with care management team member scheduled for:01/14/21 The patient has been provided with contact information for the care management team and has been advised to call with any health related questions or concerns.  Follow up with provider re: depression medications  Eduard Clos MSW, LCSW Licensed Clinical Social Worker [facility] [phone]   CLINICAL CARE PLAN: Patient Care Plan: Heart Failure (Adult)     Problem Identified: Symptom Exacerbation (Heart Failure)   Priority: High     Long-Range Goal: Symptom Exacerbation Prevented or Minimized   Start Date: 12/07/2020  Expected End Date: 06/09/2021  This Visit's Progress: On track  Priority: High  Note:   Current Barriers:  Knowledge deficit related to basic heart failure pathophysiology and self care management- needs reinforcement for HF action plan, symptom management Does not adhere to provider recommendations re: low sodium diet, sometimes eats fast food, does not exercise, feels he is not  able, stays tired, reports working with [facility] RN/ PT/ OT and home health services will be ending soon, pt reports he checks CBG once daily with most recent [facility] 5.8, checks blood pressure on occasion, has had no further GI bleeding (pt states it was due to hemorrhoids) Lacks social connections- pt lives alone, has a friend he can call if needed, does not communicate much with his family, gets out and drives to the grocery store and flea market to talk with friends. Case Manager Clinical Goal(s):  patient will weigh self daily and record patient will verbalize understanding of Heart Failure Action Plan and when to call doctor patient will take all Heart Failure  mediations as prescribed patient will weigh daily and record (notifying MD of 3 lb weight gain over night or 5 lb in a week) Interventions:  Collaboration with [name], MD regarding development and update of comprehensive plan of care as evidenced by provider attestation and co-signature Inter-disciplinary care team collaboration (see longitudinal plan of care) Basic overview and discussion of pathophysiology of Heart Failure reviewed  Provided verbal education on low sodium diet Reviewed Heart Failure Action Plan in depth and provided written copy via My Chart Assessed need for readable accurate scales in home (pt does have scales) Provided education about placing scale on hard, flat surface Advised patient to weigh each morning after emptying bladder Discussed importance of daily weight and advised patient to weigh and record daily Reviewed role of diuretics in prevention of fluid overload and management of heart failure Reviewed importance of taking all medications, using inhalers as prescribed Referral sent for LCSW services related to depression Encouraged patient to stay active, continue to work with home health RN/ PT/ OT,  try to connect with friends and be as involved with others, community as possible Reviewed all upcoming provider appointments- cardiologist 8/24,  primary care provider 9/20, annual wellness visit 10/24 Self-Care Activities: Attends all scheduled provider appointments Attends church or other social activities Calls provider office for new concerns or questions Patient Goals: - call office if I gain more than 3 pounds in one day or 5 pounds in one week- follow heart failure action plan, know how you are feeling each day - do ankle pumps when sitting - keep legs up while sitting - track weight in diary - use salt in moderation - watch for swelling in feet, ankles and legs every day - weigh myself daily  - please read over heart failure action plan sent  via My Chart Follow Up Plan: Telephone follow up appointment with care management team member scheduled for:     Patient Care Plan: COPD (Adult)     Problem Identified: Symptom Exacerbation (COPD)   Priority: High     Long-Range Goal: Symptom Exacerbation Prevented or Minimized   Start Date: 12/07/2020  Expected End Date: 06/09/2021  This Visit's Progress: On track  Priority: High  Note:   Current Barriers:  Knowledge deficits related to basic understanding of COPD disease process- needs reinforcement of COPD action plan, symptom management, pt uses CPAP, checks 02 sat as needed Knowledge deficits related to basic COPD self care/management- does not have advanced directives, declined information Knowledge deficit related to importance of energy conservation- pt gets tired easily, does not always sleep well, can benefit from light exercise, is working with home health PT Lacks social connections - lives alone, has friend he can call on, has depression PHQ-9=14, requests referral for LCSW for resources/ counseling, pt reports he  tried outpatient counseling in the past and "it did not work very well" Case Electrical engineer): patient will report using inhalers as prescribed including rinsing mouth after use patient will verbalize understanding of COPD action plan and when to seek appropriate levels of medical care patient will engage in lite exercise as tolerated to build/regain stamina and strength and reduce shortness of breath through activity tolerance patient will verbalize basic understanding of COPD disease process and self care activities  Interventions:  Collaboration with [name], MD regarding development and update of comprehensive plan of care as evidenced by provider attestation and co-signature Inter-disciplinary care team collaboration (see longitudinal plan of care) Provided patient with basic written (via My Chart) and verbal COPD education on self  care/management/and exacerbation prevention  Provided instruction about proper use of medications used for management of COPD including inhalers Advised patient to self assesses COPD action plan zone and make appointment with provider if in the yellow zone for 48 hours without improvement. Provided patient with education about the role of exercise in the management of COPD- ask pt to continue to work with home health PT and complete prescribed exercises Advised patient to engage in light exercise as tolerated 3-5 days a week and practice energy conservation Encouraged pt to limit day time naps Encouraged pt to continue meeting with friends at [place] and connecting with others to help with depression Referral placed for LCSW for depression management Self-Care Activities:  Attends all scheduled provider appointments Attends church or other social activities Calls provider office for new concerns or questions Patient Goals: - develop a rescue plan - eliminate symptom triggers at home - follow rescue plan if symptoms flare-up, call doctor early on if not feeling well - keep follow-up appointments  - continue to use CPAP as prescribed - take all medications and use inhalers as prescribed - please read over COPD action plan sent via My Chart - social worker will be contacting you for support/ resources for depression - develop a new routine to improve sleep - get at least 7 to 8 hours of sleep at night - get outdoors every day (weather permitting) - keep room cool and dark Follow Up Plan: Telephone follow up appointment with care management team member scheduled for:  01/06/2021     Patient Care Plan: LCSW PLAN OF CARE     Problem Identified: Unmanaged depression   Priority: High     Long-Range Goal: Reduce symptoms/feelings related to depression   Start Date: 12/21/2020  This Visit's Progress: On track  Priority: High  Note:   Current Barriers:  Chronic Mental Health needs  related to depression Mental Health Concerns  Suicidal Ideation/Homicidal Ideation: No  Clinical Social Work Goal(s):  patient will work with SW  PRN  by telephone or in person to reduce or manage symptoms related to depression  patient will work with SW to address concerns related to depression and treatment options patient will work with PCP and Pharmacist to address needs related to medication management/adjustment of current RX depression regimen to better treat symptoms patient will demonstrate improved health management independence as evidenced by reduction  of PHQ9 score  Interventions: CSW spoke with pt who admits to history of depression. Pt denies any current or past SI/HI, hospitalizations. Pt is currently on Buspar and Celexa for his depression; his PHQ9 score today was 80- CSW discussed with pt the significance/severity of his score and discussed options. Pt declines counseling support. He also declines referral to Psychiatry  indicating he use to see "Dr Truman Hayward" who prescribed his meds but now his PCP does. Pt does not wish to be referred back to "Dr Truman Hayward" nor any other Psychiatrist. Pt also shares that the Buspar is 3 x daily- would like to consider other options that may be better coverage of his symptoms and needs; and less pills- "have to remember to take it".    Patient interviewed and appropriate assessments performed: PHQ 9 1:1 collaboration with [name], MD regarding development and update of comprehensive plan of care as evidenced by provider attestation and co-signature Patient interviewed and appropriate assessments performed Provided mental health counseling with regard to depression; medications, counseling options  Discussed plans with patient for ongoing care management follow up and provided patient with direct contact information for care management team Provided education and assistance to client regarding Advanced Directives. Depression screen reviewed , PHQ2/  PHQ9 completed, Solution-Focused Strategies, Active listening / Reflection utilized , Psychoeducation for mental health needs , Reviewed mental health medications with patient and discussed compliance: discussed options for counseling and/or RX adjustments being made, Participation in counseling encouraged , Verbalization of feelings encouraged , Suicidal Ideation/Homicidal Ideation assessed:, Discussed [place] , Discussed referral to [place] to assist with connecting to mental health provider, and pt declines counseling at this time  Patient Self Care Activities:  Self administers medications as prescribed Attends all scheduled provider appointments Performs ADL's independently Performs IADL's independently Independent living  Patient Coping Strengths:  Hopefulness Self Advocate Able to Communicate Effectively  Patient Self Care Deficits:  Lacks social connections Family not involved/connected  Patient Goals:  - avoid negative self-talk - develop a personal safety plan - develop a plan to deal with triggers like holidays, anniversaries - have a plan for how to handle bad days - spend time or talk with others at least 2 to 3 times per week - spend time or talk with others every day - watch for early signs of feeling worse - call and visit an old friend - check out volunteer opportunities - laugh; watch a funny movie or comedian - perform a random act of kindness - practice relaxation or meditation daily - talk about feelings with a friend, family or spiritual advisor - practice positive thinking and self-talk - call to cancel if needed - keep a calendar with prescription refill dates - keep a calendar with appointment dates  Follow Up Plan: Appointment scheduled for SW follow up with client by phone on: 01/14/21 and Client will discuss medications with PCP and Pharmacist

## 2020-12-21 NOTE — Chronic Care Management (AMB) (Signed)
Chronic Care Management    Clinical Social Work Note  12/21/2020 Name: Reginald [name] MRN: [id number] DOB: 06/27/1970  Reginald [name] is a 50 y.o. year old male who is a primary care patient of [name], MD. The CCM team was consulted to assist the patient with chronic disease management and/or care coordination needs related to: Mental Health Counseling and Resources.   Engaged with patient by telephone for initial visit in response to provider referral for social work chronic care management and care coordination services.   Consent to Services:  The patient was given information about Chronic Care Management services, agreed to services, and gave verbal consent prior to initiation of services.  Please see initial visit note for detailed documentation.   Patient agreed to services and consent obtained.   Assessment: Review of patient past medical history, allergies, medications, and health status, including review of relevant consultants reports was performed today as part of a comprehensive evaluation and provision of chronic care management and care coordination services.     SDOH (Social Determinants of Health) assessments and interventions performed:  SDOH Interventions    Flowsheet Row Most Recent Value  SDOH Interventions   Depression Interventions/Treatment  Medication, Currently on Treatment  [declines counseling or Psychiatric support.]        Advanced Directives Status: See Care Plan for related entries.  CCM Care Plan  No Known Allergies  Outpatient Encounter Medications as of 12/21/2020  Medication Sig   acetaminophen (TYLENOL) 325 MG tablet Take 650 mg by mouth every 6 (six) hours as needed for mild pain.   albuterol (PROVENTIL) (2.5 MG/3ML) 0.083% nebulizer solution INHALE 1 VIAL VIA NEBULIZER EVERY 6 HOURS AS NEEDED FOR WHEEZING OR SHORTNESS OF BREATH   albuterol (VENTOLIN HFA) 108 (90 Base) MCG/ACT inhaler INHALE 2 PUFFS INTO THE LUNGS EVERY 6 (SIX) HOURS AS  NEEDED FOR WHEEZING OR SHORTNESS OF BREATH.   allopurinol (ZYLOPRIM) 300 MG tablet TAKE 1 TABLET EVERY DAY   ammonium lactate (AMLACTIN) 12 % lotion Apply 1 application topically as needed for dry skin.   atorvastatin (LIPITOR) 40 MG tablet TAKE 1 TABLET EVERY DAY   Blood Glucose Monitoring Suppl (TRUE METRIX METER) w/Device KIT USE AS DIRECTED   busPIRone (BUSPAR) 10 MG tablet TAKE 1 TABLET THREE TIMES DAILY   citalopram (CELEXA) 40 MG tablet TAKE 1 TABLET EVERY DAY   diclofenac Sodium (VOLTAREN) 1 % GEL Apply 2 g topically 4 (four) times daily.   docusate sodium (COLACE) 100 MG capsule Take 2 capsules (200 mg total) by mouth 2 (two) times daily.   fexofenadine (ALLEGRA) 180 MG tablet Take 180 mg by mouth daily.   gabapentin (NEURONTIN) 100 MG capsule Take 1 capsule in the morning and 2 capsules in the evening.   glipiZIDE (GLUCOTROL XL) 5 MG 24 hr tablet Take 1 tablet (5 mg total) by mouth daily with breakfast.   Iron, Ferrous Sulfate, 325 (65 Fe) MG TABS Take 325 mg by mouth daily.   Krill Oil 350 MG CAPS Take 1 capsule by mouth in the morning and at bedtime.    lisinopril (ZESTRIL) 20 MG tablet Take 1 tablet (20 mg total) by mouth daily.   loratadine (CLARITIN) 10 MG tablet Take 10 mg by mouth daily as needed for allergies.   metFORMIN (GLUCOPHAGE) 850 MG tablet Take 1 tablet (850 mg total) by mouth 2 (two) times daily with a meal.   metolazone (ZAROXOLYN) 5 MG tablet PRN   metoprolol tartrate (LOPRESSOR) 100  MG tablet Take 1 tablet (100 mg total) by mouth 2 (two) times daily.   mupirocin ointment (BACTROBAN) 2 % Apply 1 application topically 2 (two) times daily.   pantoprazole (PROTONIX) 40 MG tablet Take 1 tablet (40 mg total) by mouth daily.   polyethylene glycol (MIRALAX) 17 g packet Take 17 g by mouth daily as needed.   potassium chloride SA (KLOR-CON) 20 MEQ tablet TAKE 3 TABS DAILY AND TAKE AN ADDITIONAL 1 TAB ON THE DAY YOU TAKE YOUR WEEKLY METOLAZONE DOSE   rivaroxaban (XARELTO)  20 MG TABS tablet TAKE 1 Tablet BY MOUTH ONCE EVERY DAY WITH SUPPER   Semaglutide (RYBELSUS) 7 MG TABS Take 1 tablet by mouth daily before breakfast.   terbinafine (LAMISIL) 250 MG tablet Take 1 tablet (250 mg total) by mouth daily.   torsemide (DEMADEX) 20 MG tablet Take 2 tablets (40 mg total) by mouth 2 (two) times daily.   TRUE METRIX BLOOD GLUCOSE TEST test strip TEST ONE TIME DAILY FOR DIABETES   TRUEplus Lancets 33G MISC USE TO TEST ONE TIME DAILY FOR DIABETES   No facility-administered encounter medications on file as of 12/21/2020.    Patient Active Problem List   Diagnosis Date Noted   Anemia    Rectal bleeding    Heme positive stool    Anticoagulated    Chronic blood loss anemia 11/12/2020   Gastrointestinal hemorrhage 10/08/2020   S/P placement of cardiac pacemaker 02/10/2020   Encounter for examination following treatment at hospital 02/10/2020   Anxiety 02/10/2020   DM2 (diabetes mellitus, type 2) ([place]) 02/10/2020   OSA on CPAP    Abnormal liver function 03/16/2019   History of pulmonary embolism 03/16/2019   Paroxysmal atrial fibrillation ([place]) 09/05/2018   COPD (chronic obstructive pulmonary disease) ([place]) 05/07/2018   Gastroesophageal reflux disease    Pulmonary embolism ([place]) 09/18/2017   Encounter for therapeutic drug monitoring 09/04/2017   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 08/23/2017   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   Pulmonary hypertension ([place])    Chronic periodontitis 07/18/2017   Morbid obesity ([place])    Essential hypertension    Tobacco abuse    Chronic heart failure with preserved ejection fraction (HFpEF) ([place])    Chronic venous insufficiency     Conditions to be addressed/monitored: COPD and Depression; Mental Health Concerns   Care Plan : LCSW PLAN OF CARE  Updates made by Deirdre Peer, LCSW since 12/21/2020 12:00 AM     Problem: Unmanaged depression   Priority:  High     Long-Range Goal: Reduce symptoms/feelings related to depression   Start Date: 12/21/2020  This Visit's Progress: On track  Priority: High  Note:   Current Barriers:  Chronic Mental Health needs related to depression Mental Health Concerns  Suicidal Ideation/Homicidal Ideation: No  Clinical Social Work Goal(s):  patient will work with SW  PRN  by telephone or in person to reduce or manage symptoms related to depression  patient will work with SW to address concerns related to depression and treatment options patient will work with PCP and Pharmacist to address needs related to medication management/adjustment of current RX depression regimen to better treat symptoms patient will demonstrate improved health management independence as evidenced by reduction  of PHQ9 score  Interventions: CSW spoke with pt who admits to history of depression. Pt denies any current or past SI/HI, hospitalizations. Pt is currently on Buspar and Celexa for his depression; his PHQ9  score today was 48- CSW discussed with pt the significance/severity of his score and discussed options. Pt declines counseling support. He also declines referral to Psychiatry indicating he use to see "Dr Truman Hayward" who prescribed his meds but now his PCP does. Pt does not wish to be referred back to "Dr Truman Hayward" nor any other Psychiatrist. Pt also shares that the Buspar is 3 x daily- would like to consider other options that may be better coverage of his symptoms and needs; and less pills- "have to remember to take it".  CSW discussed HCPOA with pt; he does not have anyone appointed and states he does not have a relationship with his family. Encouraged pt to consider completion of Advance Directives- will mail pt packet to review.  Patient interviewed and appropriate assessments performed: PHQ 9 1:1 collaboration with [name], MD regarding development and update of comprehensive plan of care as evidenced by provider attestation and  co-signature Patient interviewed and appropriate assessments performed Provided mental health counseling with regard to depression; medications, counseling options  Discussed plans with patient for ongoing care management follow up and provided patient with direct contact information for care management team Provided education and assistance to client regarding Advanced Directives. Depression screen reviewed , PHQ2/ PHQ9 completed, Solution-Focused Strategies, Active listening / Reflection utilized , Psychoeducation for mental health needs , Reviewed mental health medications with patient and discussed compliance: discussed options for counseling and/or RX adjustments being made, Participation in counseling encouraged , Verbalization of feelings encouraged , Suicidal Ideation/Homicidal Ideation assessed:, Discussed [place] , Discussed referral to [place] to assist with connecting to mental health provider, and pt declines counseling at this time  Patient Self Care Activities:  Self administers medications as prescribed Attends all scheduled provider appointments Performs ADL's independently Performs IADL's independently Independent living  Patient Coping Strengths:  Hopefulness Self Advocate Able to Communicate Effectively  Patient Self Care Deficits:  Lacks social connections Family not involved/connected  Patient Goals:  - avoid negative self-talk - develop a personal safety plan - develop a plan to deal with triggers like holidays, anniversaries - have a plan for how to handle bad days - spend time or talk with others at least 2 to 3 times per week - spend time or talk with others every day - watch for early signs of feeling worse - call and visit an old friend - check out volunteer opportunities - laugh; watch a funny movie or comedian - perform a random act of kindness - practice relaxation or meditation daily - talk about feelings with a friend, family  or spiritual advisor - practice positive thinking and self-talk - call to cancel if needed - keep a calendar with prescription refill dates - keep a calendar with appointment dates  Follow Up Plan: Appointment scheduled for SW follow up with client by phone on: 01/14/21 and Client will discuss medications with PCP and Pharmacist       Follow Up Plan: SW will follow up with patient by phone over the next 3-4 weeks      Eduard Clos MSW, LCSW Licensed Clinical Psychiatrist Primary Care [phone]

## 2020-12-22 ENCOUNTER — Telehealth: Payer: Self-pay

## 2020-12-22 MED ORDER — POTASSIUM CHLORIDE CRYS ER 20 MEQ PO TBCR: EXTENDED_RELEASE_TABLET | ORAL | 3 refills | Status: DC

## 2020-12-22 NOTE — Telephone Encounter (Signed)
Medication refill request for Potassium Chloride 20 mEq tablets approved and sent to the pharmacy per pt request.

## 2020-12-23 ENCOUNTER — Other Ambulatory Visit: Payer: Self-pay | Admitting: Internal Medicine

## 2020-12-23 DIAGNOSIS — D509 Iron deficiency anemia, unspecified: Secondary | ICD-10-CM

## 2020-12-25 ENCOUNTER — Other Ambulatory Visit: Payer: Self-pay | Admitting: Student

## 2020-12-28 ENCOUNTER — Other Ambulatory Visit: Payer: Self-pay

## 2020-12-28 ENCOUNTER — Ambulatory Visit: Payer: Medicare HMO | Admitting: Pharmacist

## 2020-12-28 DIAGNOSIS — I480 Paroxysmal atrial fibrillation: Secondary | ICD-10-CM

## 2020-12-28 DIAGNOSIS — I10 Essential (primary) hypertension: Secondary | ICD-10-CM

## 2020-12-28 DIAGNOSIS — I5032 Chronic diastolic (congestive) heart failure: Secondary | ICD-10-CM

## 2020-12-28 DIAGNOSIS — J449 Chronic obstructive pulmonary disease, unspecified: Secondary | ICD-10-CM

## 2020-12-28 DIAGNOSIS — E1169 Type 2 diabetes mellitus with other specified complication: Secondary | ICD-10-CM

## 2020-12-28 NOTE — Chronic Care Management (AMB) (Addendum)
Chronic Care Management Pharmacy Note  12/28/2020 Name:  Reginald [name] MRN:  [id number] DOB:  02/21/1971  Subjective: Reginald [name] is an 50 y.o. year old male who is a primary patient of [name], MD.  The CCM team was consulted for assistance with disease management and care coordination needs.    Engaged with patient face to face for initial visit in response to provider referral for pharmacy case management and/or care coordination services.   Consent to Services:  The patient was given information about Chronic Care Management services, agreed to services, and gave verbal consent prior to initiation of services.  Please see initial visit note for detailed documentation.   Patient Care Team: [name], MD as PCP - General (Internal Medicine) Thompson Grayer, MD as PCP - Electrophysiology (Cardiology) Josue Hector, MD as PCP - Cardiology (Cardiology) Kassie Mends, RN as [place] Management [place], Ranae Pila, LCSW as Social Worker (Licensed Clinical Social Worker) Beryle Lathe, [facility] (Pharmacist)  Objective:  Lab Results  Component Value Date   CREATININE 2.28 (H) 12/20/2020   CREATININE 1.57 (H) 11/24/2020   CREATININE 1.25 (H) 11/15/2020    Lab Results  Component Value Date   HGBA1C 5.4 11/12/2020   Last diabetic Eye exam: No results found for: HMDIABEYEEXA  Last diabetic Foot exam: No results found for: HMDIABFOOTEX      Component Value Date/Time   CHOL 123 06/10/2020 0837   TRIG 281 (H) 06/10/2020 0837   HDL 25 (L) 06/10/2020 0837   CHOLHDL 4.9 06/10/2020 0837   CHOLHDL 6.6 01/01/2020 1014   VLDL 73 (H) 01/01/2020 1014   [place] 54 06/10/2020 0837    Hepatic Function Latest Ref Rng & Units 11/24/2020 11/15/2020 11/14/2020  Total Protein 6.0 - 8.5 g/dL 7.9 8.0 7.8  Albumin 4.0 - 5.0 g/dL 4.3 3.6 3.6  AST 0 - 40 IU/L 41(H) 32 30  ALT 0 - 44 IU/L '27 23 23  ' Alk Phosphatase 44 - 121 IU/L 98 76 72  Total Bilirubin 0.0 -  1.2 mg/dL 0.7 1.4(H) 1.2  Bilirubin, Direct 0.0 - 0.2 mg/dL - - -    Lab Results  Component Value Date/Time   TSH 4.060 10/04/2020 09:43 AM   TSH 5.240 (H) 06/10/2020 08:37 AM   FREET4 1.30 10/04/2020 09:43 AM   FREET4 0.79 (L) 05/03/2018 02:04 PM    CBC Latest Ref Rng & Units 11/24/2020 11/15/2020 11/14/2020  WBC 3.4 - 10.8 x10E3/uL 8.0 10.9(H) 9.7  Hemoglobin 13.0 - 17.7 g/dL 12.0(L) 10.4(L) 10.7(L)  Hematocrit 37.5 - 51.0 % 40.9 35.1(L) 36.1(L)  Platelets 150 - 450 x10E3/uL 320 240 245    Clinical ASCVD: No  The ASCVD Risk score Mikey Bussing DC Jr., et al., 2013) failed to calculate for the following reasons:   The valid total cholesterol range is 130 to 320 mg/dL    Other:  CHADS2VASc: 3 CAT: 23  Social History   Tobacco Use  Smoking Status Former   Packs/day: 2.00   Years: 16.00   Pack years: 32.00   Types: Cigarettes   Quit date: 08/23/2017   Years since quitting: 3.3  Smokeless Tobacco Never   BP Readings from Last 3 Encounters:  11/26/20 138/74  11/26/20 135/75  11/15/20 133/79   Pulse Readings from Last 3 Encounters:  11/26/20 68  11/26/20 71  11/15/20 60   Wt Readings from Last 3 Encounters:  11/26/20 (!) 463 lb 12.8 oz (210.4 kg)  11/26/20 (!) 470 lb (213.2 kg)  11/12/20 (!) 489 lb (221.8 kg)    Assessment: Review of patient past medical history, allergies, medications, health status, including review of consultants reports, laboratory and other test data, was performed as part of comprehensive evaluation and provision of chronic care management services.   SDOH:  (Social Determinants of Health) assessments and interventions performed:    CCM Care Plan  No Known Allergies  Medications Reviewed Today     Reviewed by Beryle Lathe, [facility] (Pharmacist) on 12/28/20 at [place] List Status: <None>   Medication Order Taking? Sig Documenting Provider Last Dose Status Informant  acetaminophen (TYLENOL) 325 MG tablet [id number] Yes Take 650 mg by mouth  every 6 (six) hours as needed for mild pain. [provider] Taking Active Self  albuterol (PROVENTIL) (2.5 MG/3ML) 0.083% nebulizer solution [id number] Yes INHALE 1 VIAL VIA NEBULIZER EVERY 6 HOURS AS NEEDED FOR WHEEZING OR SHORTNESS OF Sara Chu, MD Taking Active Self           Med Note Beryle Lathe   Tue Dec 28, 2020 10:40 AM) Only uses every few weeks when he mows yard  albuterol (VENTOLIN HFA) 108 (90 Base) MCG/ACT inhaler [id number] Yes INHALE 2 PUFFS INTO THE LUNGS EVERY 6 (SIX) HOURS AS NEEDED FOR WHEEZING OR SHORTNESS OF BREATH. [name], MD Taking Active            Med Note Beryle Lathe   Tue Dec 28, 2020 10:41 AM) Uses a few times per week  allopurinol (ZYLOPRIM) 300 MG tablet [id number] Yes TAKE 1 TABLET EVERY DAY [name], MD Taking Active Self  ammonium lactate (AMLACTIN) 12 % lotion [id number] Yes Apply 1 application topically as needed for dry skin. Felipa Furnace, DPM Taking Active Self  atorvastatin (LIPITOR) 40 MG tablet [id number] Yes TAKE 1 TABLET EVERY DAY [name], MD Taking Active Self  Blood Glucose Monitoring Suppl (TRUE METRIX METER) w/Device KIT [id number] Yes USE AS DIRECTED [name], MD Taking Active Self  busPIRone (BUSPAR) 10 MG tablet [id number] Yes TAKE 1 TABLET THREE TIMES DAILY [name], MD Taking Active Self  citalopram (CELEXA) 40 MG tablet [id number] Yes TAKE 1 TABLET EVERY DAY [name], MD Taking Active Self  diclofenac Sodium (VOLTAREN) 1 % GEL [id number] Yes Apply 2 g topically 4 (four) times daily. Mordecai Rasmussen, MD Taking Active Self  docusate sodium (COLACE) 100 MG capsule [id number] Yes Take 2 capsules (200 mg total) by mouth 2 (two) times daily. Thurnell Lose, MD Taking Active   FEROSUL 325 [phone] Fe) MG tablet [id number] Yes TAKE 1 TABLET EVERY DAY [name], MD Taking Active   fexofenadine (ALLEGRA) 180 MG tablet [id number] No Take 180 mg by mouth daily.  Patient not  taking: Reported on 12/28/2020   [provider] Not Taking Active Self  gabapentin (NEURONTIN) 100 MG capsule [id number] Yes Take 1 capsule in the morning and 2 capsules in the evening. Thurnell Lose, MD Taking Active   glipiZIDE (GLUCOTROL XL) 5 MG 24 hr tablet [id number] Yes Take 1 tablet (5 mg total) by mouth daily with breakfast. [name], MD Taking Active Self  Krill Oil 350 MG CAPS [id number] Yes Take 1 capsule by mouth in the morning and at bedtime.  [provider] Taking Active Self           Med Note Waldo Laine, [place]  Dec 28, 2020 10:44 AM) 1200 mg  lisinopril (ZESTRIL) 20 MG tablet [id number] Yes Take 1 tablet (20 mg total) by mouth daily. Erma Heritage, PA-C Taking Active Self  loratadine (CLARITIN) 10 MG tablet [id number] No Take 10 mg by mouth daily as needed for allergies.  Patient not taking: Reported on 12/28/2020   [provider] Not Taking Active Self  metFORMIN (GLUCOPHAGE) 850 MG tablet [id number] Yes Take 1 tablet (850 mg total) by mouth 2 (two) times daily with a meal. [name], MD Taking Active Self  metolazone (ZAROXOLYN) 5 MG tablet [id number] Yes PRN  Patient taking differently: Take 5 mg by mouth once a week. PRN   Thompson Grayer, MD Taking Active Self  metoprolol tartrate (LOPRESSOR) 100 MG tablet [id number] Yes TAKE 1 TABLET TWICE DAILY Josue Hector, MD Taking Active   pantoprazole (PROTONIX) 40 MG tablet [id number] Yes Take 1 tablet (40 mg total) by mouth daily. [name], MD Taking Active Self  polyethylene glycol (MIRALAX) 17 g packet [id number] No Take 17 g by mouth daily as needed.  Patient not taking: Reported on 12/28/2020   Thurnell Lose, MD Not Taking Active   potassium chloride SA (KLOR-CON) 20 MEQ tablet [id number] Yes TAKE 3 TABS DAILY AND TAKE AN ADDITIONAL 1 TAB ON THE DAY YOU TAKE YOUR WEEKLY METOLAZONE DOSE Ahmed Prima, Fransisco Hertz, PA-C Taking Active   rivaroxaban (XARELTO) 20 MG TABS tablet  [id number] Yes TAKE 1 Tablet BY MOUTH ONCE EVERY DAY WITH SUPPER Josue Hector, MD Taking Active Self  Semaglutide (RYBELSUS) 14 MG TABS [id number] Yes Take 14 mg by mouth in the morning. [provider] Taking Active Self           Med Note Beryle Lathe   Tue Dec 28, 2020 10:49 AM) Marguerite Olea from [facility] patient assistance program  terbinafine (LAMISIL) 250 MG tablet [id number] Yes Take 1 tablet by mouth once daily [name], MD Taking Active   torsemide (DEMADEX) 20 MG tablet [id number] Yes Take 40 mg in the AM and Take 20 mg in the PM  Patient taking differently: Take 40 mg by mouth 2 (two) times daily.   Erma Heritage, [place] Taking Active Self  TRUE METRIX BLOOD GLUCOSE TEST test strip [id number]  TEST ONE TIME DAILY FOR DIABETES [name], MD  Active Self  TRUEplus Lancets 33G MISC [id number]  USE TO TEST ONE TIME DAILY FOR DIABETES [name], MD  Active Self            Patient Active Problem List   Diagnosis Date Noted   Anemia    Rectal bleeding    Heme positive stool    Anticoagulated    Chronic blood loss anemia 11/12/2020   Gastrointestinal hemorrhage 10/08/2020   S/P placement of cardiac pacemaker 02/10/2020   Encounter for examination following treatment at hospital 02/10/2020   Anxiety 02/10/2020   DM2 (diabetes mellitus, type 2) ([place]) 02/10/2020   OSA on CPAP    Abnormal liver function 03/16/2019   History of pulmonary embolism 03/16/2019   Paroxysmal atrial fibrillation ([place]) 09/05/2018   COPD (chronic obstructive pulmonary disease) ([place]) 05/07/2018   Gastroesophageal reflux disease    Pulmonary embolism ([place]) 09/18/2017   Encounter for therapeutic drug monitoring 09/04/2017   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 08/23/2017   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   Pulmonary hypertension (HCC)    Chronic periodontitis 07/18/2017  Morbid obesity ([place])     Essential hypertension    Tobacco abuse    Chronic heart failure with preserved ejection fraction (HFpEF) (HCC)    Chronic venous insufficiency     Immunization History  Administered Date(s) Administered   Influenza Inj Mdck Quad Pf 02/27/2019   Influenza,inj,Quad PF,6+ Mos 02/10/2020   Janssen (J&J) SARS-COV-2 Vaccination 09/16/2019   PFIZER(Purple Top)SARS-COV-2 Vaccination 03/15/2020   Pneumococcal Polysaccharide-23 03/17/2019    Conditions to be addressed/monitored: Atrial Fibrillation, CHF, HTN, COPD, and DMII  Care Plan : Medication Management  Updates made by Beryle Lathe, [place] since 12/28/2020 12:00 AM     Problem: COPD, HFpEF, HTN, T2DM, Afib,   Priority: High  Onset Date: 12/28/2020     Goal: Disease Progression Prevention   Start Date: 12/28/2020  Expected End Date: 03/28/2021  This Visit's Progress: On track  Priority: High  Note:   Current Barriers:  Unable to independently monitor therapeutic efficacy  Pharmacist Clinical Goal(s):  Over the next 90 days, patient will achieve adherence to monitoring guidelines and medication adherence to achieve therapeutic efficacy through collaboration with PharmD and provider.   Interventions: 1:1 collaboration with [name], MD regarding development and update of comprehensive plan of care as evidenced by provider attestation and co-signature Inter-disciplinary care team collaboration (see longitudinal plan of care) Comprehensive medication review performed; medication list updated in electronic medical record  Diabetes: Current medications: metformin 850 mg by mouth twice daily, semaglutide (Rybelsus) 14 mg by mouth at least 30 minutes before the first food, beverage, or other oral medications of the day with no more than 4 ounces of plain water only, and glipizide XL 5 mg by mouth once daily Intolerances: none Taking medications as directed: yes Side effects thought to be attributed to current medication  regimen: yes, does report some occasional hypoglycemia symptoms which could be associated with glipizide; GI upset with metformin but ok with continuing for now given benefits Reports hypoglycemic symptoms (dizziness/blurred vision/sweaty) with BG in the 70-90s; denies BG <70 recently Current meal patterns: breakfast: eggs, oatmeal, chicken sandwhich, and baked beans ; lunch:  ribeye and [employer] in air fryer ; dinner:  leftovers from lunch ; snacks: none; drinks: water, regular soda, milk, and sports drinks Has noticed decreased appetite since starting Rybelsus Current exercise: none Controlled; Most recent A1c at goal of <7% per ADA guidelines Current glucose readings: fasting blood glucose: within goal range of 80-130 mg/dL per ADA guidelines, post prandial glucose: at goal of <180 mg/dL per ADA guidelines Medication: Identify diabetes medication and when best taken Monitoring: target blood sugar range, when to test Hypoglycemia: I have discussed with the patient how to treat hypoglycemia by the rule of 15; eat/drink 15g of sugar in the form of glucose tabs, 4 ounces of juice or soda and recheck fingerstick glucose in 15 minutes. Chocolate bars and ice cream should be avoided because fat delays carbohydrate digestion and absorption. Retreat if glucose remains low. Driving should cease until glucose is normal. Recommend to continue metformin 850 mg by mouth twice daily and semaglutide (Rybelsus) 14 mg by mouth at least 30 minutes before the first food, beverage, or other oral medications of the day with no more than 4 ounces of plain water only Discussed with Dr. Posey Pronto and will discontinue glipizide XL 5 mg by mouth once daily given hypoglycemia risk and well controlled A1c  Patient identified as a good candidate for SGLT2i once serum creatinine improves to baseline given heart failure  with preserved ejection fraction. If able to add SGLT2i, could potentially consider discontinuation of  metformin given some GI upset.   Instructed to monitor blood sugars once a day at the following times: fasting (at least 8 hours since last food consumption) and whenever patient experiences symptoms of hypo/hyperglycemia Encouraged regular aerobic exercise with a goal of 30 minutes five times per week (150 minutes per week) Discussed management of hypoglycemia. If blood sugar <70 at any time, treat with simple sugar such as 1/2 cup juice or regular soda or 3-4 glucose tablets. Recheck blood sugar in 15 minutes and repeat if blood sugar remains <70.   Hypertension: Current medications: lisinopril 20 mg by mouth once daily Intolerances: none Taking medications as directed: yes Side effects thought to be attributed to current medication regimen: no Reports some orthostatic hypotension but knows to get up slowly Current exercise: none Home blood pressure readings: not checking currently Blood pressure under fair control. Factors affecting control of BP include high salt intake, elevated BMI, lack of exercise, sleep apnea, and volume overload. Blood pressure is at goal of <130/80 mmHg per 2017 AHA/ACC guidelines. Continue lisinopril 20 mg by mouth once daily Check BMP at next PCP visit Encourage dietary sodium restriction/DASH diet Recommend regular aerobic exercise Recommend home blood pressure monitoring, to bring results in next visit Discussed need for and importance of continued work on weight loss Discussed need for medication compliance Reviewed risks of hypertension, principles of treatment and consequences of untreated hypertension  Heart failure with preserved ejection fraction (LVEF >50% with evidence of spontaneous or provokable increased LV filling pressures): Suboptimally managed Has pacemaker Current treatment: lisinopril 20 mg by mouth once daily, metoprolol tartrate 100 mg by mouth twice daily, furosemide 40 mg twice daily, and metolazone 53m mg by mouth weekly as needed Stage  C (Symptomatic heart failure)/NYHA Class III (Marked limitation of physical activity. Comfortable at rest. Less than ordinary activity causes fatigue, palpitation, or dyspnea) Most recent echocardiogram is unknown Current home vitals: not checking Current home weights: patient not sure Reports shortness of breath with activity or when lying down, fatigue and weakness, swelling in the legs, ankles and feet, and reduced ability to exercise. Denies persistent cough or wheezing, nausea and lack of appetite, and chest pain Continue lisinopril 20 mg by mouth once daily, metoprolol tartrate 100 mg by mouth twice daily, torsemide 40 mg twice daily, and metolazone 5 mg by mouth weekly as needed  Consider transition from metoprolol tartrate to metoprolol succinate to decrease pill burden per patient request. Will discuss with cardiology provider Consider addition of SGLT2i (Jardiance or FIran per 2022 AHA Heart Failure Guidelines to lower risk of cardiovascular death or hospitalization for heart failure. Will discuss with cardiology provider Check BMP at next visit with cardiology Encourage dietary sodium restriction (<3 g/day) Educated on the importance of weighing daily. Patient aware to contact cardiology/primary care team if weight gain >3 lbs in 1 day or >5 lbs in 1 week Recommend home blood pressure monitoring, to bring results in next visit Discussed need for and importance of continued work on weight loss Discussed need for medication compliance Recommend restricted fluid intake (eg, 2 L/day)  Chronic Obstructive Pulmonary Disease: Controlled per patient Stopped smoking 08/23/2017; smoked for 30 years (1 PPD) Current treatment: albuterol metered dose (ProAir, Ventolin, Proventil) 1 puff by mouth as needed for shortness of breath; only using ~2 times per week GOLD Classification: unknown CAT score (12/28/20): 23 Most recent Pulmonary Function Testing: N/A 0 exacerbations  requiring treatment in the  last 6 months  Current oxygen requirements: none Given elevated CAT score >10, would consider adding a LAMA such as Spiriva Respimat. Patient will qualify for assistance through [facility].   Atrial Fibrillation: Controlled Current rate control: metoprolol tartrate 100 mg by mouth twice daily Most recent ECG: normal sinus Anticoagulation: rivaroxaban (Xarelto) 20 mg by mouth once daily Reports bleeding event during last hospitalization but none recently  CHADS2VASc score: 3 (CHF, hypertension, and diabetes) Prior ablation: '[]'  Yes  '[x]'  No Home blood pressure: not checking Home heart rate: not checking Continue metoprolol  Continue rivaroxaban (Xarelto) 20 mg by mouth once daily Encouraged regular physical activity for general health benefits Recommend home blood pressure and heart rate monitoring, to bring results in next visit Discussed need for medication compliance  Patient Goals/Self-Care Activities Over the next 90 days, patient will:  take medications as prescribed check glucose at least once daily, document, and provide at future appointments check blood pressure at least once daily, document, and provide at future appointments weigh daily, and contact provider if weight gain of 3 lbs in a day or more than 5 lbs in a week  Follow Up Plan: Face to Face appointment with care management team member scheduled for:  01/18/21      Medication Assistance: Rybelsus obtained through [facility] medication assistance program.  Patient's preferred pharmacy is:  [facility] [address] [place] [address] Phone: [phone] Fax: [phone]  [facility] - [place], [place] [address] Phone: [phone] Fax: [phone]  Uses pill box? No - uses pill bottles since he is unable to fit all meds ina pill box  Follow Up:  Patient agrees to  Care Plan and Follow-up.  Plan: Face to Face appointment with care management team member scheduled for: 01/18/21  Kennon Holter, PharmD Clinical Pharmacist [facility] [phone]

## 2020-12-28 NOTE — Addendum Note (Signed)
Addended by: Beryle Lathe on: 12/28/2020 02:56 PM   Modules accepted: Orders

## 2020-12-28 NOTE — Patient Instructions (Signed)
Visit Information  PATIENT GOALS:  Goals Addressed             This Visit's Progress    Medication Management       Patient Goals/Self-Care Activities Over the next 90 days, patient will:  take medications as prescribed check glucose at least once daily, document, and provide at future appointments check blood pressure at least once daily, document, and provide at future appointments weigh daily, and contact provider if weight gain of 3 lbs in a day or more than 5 lbs in a week        Patient verbalizes understanding of instructions provided today and agrees to view in [place].   Face to Face appointment with care management team member scheduled for:  01/18/21  Kennon Holter, PharmD Clinical Pharmacist [facility] [phone]

## 2020-12-29 DIAGNOSIS — E1151 Type 2 diabetes mellitus with diabetic peripheral angiopathy without gangrene: Secondary | ICD-10-CM | POA: Diagnosis not present

## 2020-12-29 DIAGNOSIS — I272 Pulmonary hypertension, unspecified: Secondary | ICD-10-CM | POA: Diagnosis not present

## 2020-12-29 DIAGNOSIS — I480 Paroxysmal atrial fibrillation: Secondary | ICD-10-CM | POA: Diagnosis not present

## 2020-12-29 DIAGNOSIS — I2510 Atherosclerotic heart disease of native coronary artery without angina pectoris: Secondary | ICD-10-CM | POA: Diagnosis not present

## 2020-12-29 DIAGNOSIS — I110 Hypertensive heart disease with heart failure: Secondary | ICD-10-CM | POA: Diagnosis not present

## 2020-12-29 DIAGNOSIS — I872 Venous insufficiency (chronic) (peripheral): Secondary | ICD-10-CM | POA: Diagnosis not present

## 2020-12-29 DIAGNOSIS — J439 Emphysema, unspecified: Secondary | ICD-10-CM | POA: Diagnosis not present

## 2020-12-29 DIAGNOSIS — K625 Hemorrhage of anus and rectum: Secondary | ICD-10-CM | POA: Diagnosis not present

## 2020-12-29 DIAGNOSIS — I5032 Chronic diastolic (congestive) heart failure: Secondary | ICD-10-CM | POA: Diagnosis not present

## 2020-12-30 ENCOUNTER — Ambulatory Visit (INDEPENDENT_AMBULATORY_CARE_PROVIDER_SITE_OTHER): Payer: Medicare HMO

## 2020-12-30 ENCOUNTER — Other Ambulatory Visit: Payer: Self-pay

## 2020-12-30 DIAGNOSIS — E1140 Type 2 diabetes mellitus with diabetic neuropathy, unspecified: Secondary | ICD-10-CM | POA: Diagnosis not present

## 2020-12-30 DIAGNOSIS — E1169 Type 2 diabetes mellitus with other specified complication: Secondary | ICD-10-CM

## 2020-12-30 DIAGNOSIS — M2041 Other hammer toe(s) (acquired), right foot: Secondary | ICD-10-CM | POA: Diagnosis not present

## 2020-12-30 DIAGNOSIS — M2042 Other hammer toe(s) (acquired), left foot: Secondary | ICD-10-CM | POA: Diagnosis not present

## 2020-12-30 NOTE — Progress Notes (Signed)
Patient in office today to pick up diabetic shoes. Shoes were tried on and patient was satisfied with the fit. Patient was educated on the break-in process and patient verbalized understanding using the teach back method. Advised patient to contact the office with any questions, comments or concerns.

## 2021-01-04 ENCOUNTER — Telehealth: Payer: Self-pay | Admitting: Internal Medicine

## 2021-01-04 ENCOUNTER — Encounter: Payer: Self-pay | Admitting: Internal Medicine

## 2021-01-04 NOTE — Telephone Encounter (Signed)
Pt is calling regarding Novolog, he needs a refill--please call

## 2021-01-05 ENCOUNTER — Other Ambulatory Visit: Payer: Self-pay

## 2021-01-05 ENCOUNTER — Other Ambulatory Visit (HOSPITAL_COMMUNITY)
Admission: RE | Admit: 2021-01-05 | Discharge: 2021-01-05 | Disposition: A | Discharge status: Home / Self Care | Payer: Medicare HMO | Source: Ambulatory Visit | Attending: Student | Admitting: Student

## 2021-01-05 DIAGNOSIS — Z79899 Other long term (current) drug therapy: Secondary | ICD-10-CM

## 2021-01-05 LAB — CBC WITH DIFFERENTIAL/PLATELET
Abs Immature Granulocytes: 0.07 10*3/uL (ref 0.00–0.07)
Basophils Absolute: 0 10*3/uL (ref 0.0–0.1)
Basophils Relative: 0 %
Eosinophils Absolute: 0.3 10*3/uL (ref 0.0–0.5)
Eosinophils Relative: 3 %
HCT: 35.3 % — ABNORMAL LOW (ref 39.0–52.0)
Hemoglobin: 10.5 g/dL — ABNORMAL LOW (ref 13.0–17.0)
Immature Granulocytes: 1 %
Lymphocytes Relative: 18 %
Lymphs Abs: 1.9 10*3/uL (ref 0.7–4.0)
MCH: 25.5 pg — ABNORMAL LOW (ref 26.0–34.0)
MCHC: 29.7 g/dL — ABNORMAL LOW (ref 30.0–36.0)
MCV: 85.9 fL (ref 80.0–100.0)
Monocytes Absolute: 0.4 10*3/uL (ref 0.1–1.0)
Monocytes Relative: 4 %
Neutro Abs: 7.9 10*3/uL — ABNORMAL HIGH (ref 1.7–7.7)
Neutrophils Relative %: 74 %
Platelets: 261 10*3/uL (ref 150–400)
RBC: 4.11 MIL/uL — ABNORMAL LOW (ref 4.22–5.81)
RDW: 19.1 % — ABNORMAL HIGH (ref 11.5–15.5)
WBC: 10.7 10*3/uL — ABNORMAL HIGH (ref 4.0–10.5)
nRBC: 0 % (ref 0.0–0.2)

## 2021-01-05 LAB — BASIC METABOLIC PANEL
Anion gap: 8 (ref 5–15)
BUN: 24 mg/dL — ABNORMAL HIGH (ref 6–20)
CO2: 29 mmol/L (ref 22–32)
Calcium: 9 mg/dL (ref 8.9–10.3)
Chloride: 100 mmol/L (ref 98–111)
Creatinine, Ser: 1.18 mg/dL (ref 0.61–1.24)
GFR, Estimated: >60 mL/min (ref >60)
Glucose, Bld: 137 mg/dL — ABNORMAL HIGH (ref 70–99)
Potassium: 3.7 mmol/L (ref 3.5–5.1)
Sodium: 137 mmol/L (ref 135–145)

## 2021-01-05 NOTE — Telephone Encounter (Signed)
Sent pt mychart message regarding his paperwork

## 2021-01-06 ENCOUNTER — Telehealth: Payer: Self-pay

## 2021-01-06 ENCOUNTER — Ambulatory Visit: Payer: Medicare HMO | Admitting: *Deleted

## 2021-01-06 DIAGNOSIS — J449 Chronic obstructive pulmonary disease, unspecified: Secondary | ICD-10-CM

## 2021-01-06 DIAGNOSIS — I5032 Chronic diastolic (congestive) heart failure: Secondary | ICD-10-CM

## 2021-01-06 NOTE — Chronic Care Management (AMB) (Signed)
Chronic Care Management   CCM RN Visit Note  01/06/2021 Name: Reginald [name] MRN: [id number] DOB: 16-Dec-1970  Subjective: Reginald [name] is a 50 y.o. year old male who is a primary care patient of [name], MD. The care management team was consulted for assistance with disease management and care coordination needs.    Engaged with patient by telephone for follow up visit in response to provider referral for case management and/or care coordination services.   Consent to Services:  The patient was given information about Chronic Care Management services, agreed to services, and gave verbal consent prior to initiation of services.  Please see initial visit note for detailed documentation.   Patient agreed to services and verbal consent obtained.   Assessment: Review of patient past medical history, allergies, medications, health status, including review of consultants reports, laboratory and other test data, was performed as part of comprehensive evaluation and provision of chronic care management services.   SDOH (Social Determinants of Health) assessments and interventions performed:    CCM Care Plan  No Known Allergies  Outpatient Encounter Medications as of 01/06/2021  Medication Sig Note   acetaminophen (TYLENOL) 325 MG tablet Take 650 mg by mouth every 6 (six) hours as needed for mild pain.    albuterol (PROVENTIL) (2.5 MG/3ML) 0.083% nebulizer solution INHALE 1 VIAL VIA NEBULIZER EVERY 6 HOURS AS NEEDED FOR WHEEZING OR SHORTNESS OF BREATH 12/28/2020: Only uses every few weeks when he mows yard   albuterol (VENTOLIN HFA) 108 (90 Base) MCG/ACT inhaler INHALE 2 PUFFS INTO THE LUNGS EVERY 6 (SIX) HOURS AS NEEDED FOR WHEEZING OR SHORTNESS OF BREATH. 12/28/2020: Uses a few times per week   allopurinol (ZYLOPRIM) 300 MG tablet TAKE 1 TABLET EVERY DAY    ammonium lactate (AMLACTIN) 12 % lotion Apply 1 application topically as needed for dry skin.    atorvastatin (LIPITOR) 40 MG tablet  TAKE 1 TABLET EVERY DAY    Blood Glucose Monitoring Suppl (TRUE METRIX METER) w/Device KIT USE AS DIRECTED    busPIRone (BUSPAR) 10 MG tablet TAKE 1 TABLET THREE TIMES DAILY    citalopram (CELEXA) 40 MG tablet TAKE 1 TABLET EVERY DAY    diclofenac Sodium (VOLTAREN) 1 % GEL Apply 2 g topically 4 (four) times daily.    docusate sodium (COLACE) 100 MG capsule Take 2 capsules (200 mg total) by mouth 2 (two) times daily.    FEROSUL 325 (65 Fe) MG tablet TAKE 1 TABLET EVERY DAY    gabapentin (NEURONTIN) 100 MG capsule Take 1 capsule in the morning and 2 capsules in the evening.    Krill Oil 350 MG CAPS Take 1 capsule by mouth in the morning and at bedtime.  12/28/2020: 1200 mg   lisinopril (ZESTRIL) 20 MG tablet Take 1 tablet (20 mg total) by mouth daily.    metFORMIN (GLUCOPHAGE) 850 MG tablet Take 1 tablet (850 mg total) by mouth 2 (two) times daily with a meal.    metolazone (ZAROXOLYN) 5 MG tablet PRN (Patient taking differently: Take 5 mg by mouth once a week. PRN)    metoprolol tartrate (LOPRESSOR) 100 MG tablet TAKE 1 TABLET TWICE DAILY    pantoprazole (PROTONIX) 40 MG tablet Take 1 tablet (40 mg total) by mouth daily.    polyethylene glycol (MIRALAX) 17 g packet Take 17 g by mouth daily as needed.    potassium chloride SA (KLOR-CON) 20 MEQ tablet TAKE 3 TABS DAILY AND TAKE AN ADDITIONAL 1 TAB ON THE  DAY YOU TAKE YOUR WEEKLY METOLAZONE DOSE    rivaroxaban (XARELTO) 20 MG TABS tablet TAKE 1 Tablet BY MOUTH ONCE EVERY DAY WITH SUPPER    Semaglutide (RYBELSUS) 14 MG TABS Take 14 mg by mouth in the morning. 12/28/2020: Gets from [place] patient assistance program   terbinafine (LAMISIL) 250 MG tablet Take 1 tablet by mouth once daily    torsemide (DEMADEX) 20 MG tablet Take 40 mg in the AM and Take 20 mg in the PM (Patient taking differently: Take 40 mg by mouth 2 (two) times daily.)    TRUE METRIX BLOOD GLUCOSE TEST test strip TEST ONE TIME DAILY FOR DIABETES    TRUEplus Lancets 33G MISC USE TO  TEST ONE TIME DAILY FOR DIABETES    fexofenadine (ALLEGRA) 180 MG tablet Take 180 mg by mouth daily. (Patient not taking: No sig reported)    loratadine (CLARITIN) 10 MG tablet Take 10 mg by mouth daily as needed for allergies. (Patient not taking: No sig reported)    No facility-administered encounter medications on file as of 01/06/2021.    Patient Active Problem List   Diagnosis Date Noted   Anemia    Rectal bleeding    Heme positive stool    Anticoagulated    Chronic blood loss anemia 11/12/2020   Gastrointestinal hemorrhage 10/08/2020   S/P placement of cardiac pacemaker 02/10/2020   Encounter for examination following treatment at hospital 02/10/2020   Anxiety 02/10/2020   DM2 (diabetes mellitus, type 2) ([place]) 02/10/2020   OSA on CPAP    Abnormal liver function 03/16/2019   History of pulmonary embolism 03/16/2019   Paroxysmal atrial fibrillation ([place]) 09/05/2018   COPD (chronic obstructive pulmonary disease) ([place]) 05/07/2018   Gastroesophageal reflux disease    Pulmonary embolism ([place]) 09/18/2017   Encounter for therapeutic drug monitoring 09/04/2017   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 08/23/2017   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   Pulmonary hypertension ([place])    Chronic periodontitis 07/18/2017   Morbid obesity ([place])    Essential hypertension    Tobacco abuse    Chronic heart failure with preserved ejection fraction (HFpEF) ([place])    Chronic venous insufficiency     Conditions to be addressed/monitored:CHF and COPD  Care Plan : Heart Failure (Adult)  Updates made by Kassie Mends, RN since 01/06/2021 12:00 AM     Problem: Symptom Exacerbation (Heart Failure)   Priority: High     Long-Range Goal: Symptom Exacerbation Prevented or Minimized   Start Date: 12/07/2020  Expected End Date: 06/09/2021  This Visit's Progress: On track  Recent Progress: On track  Priority: High  Note:    Current Barriers:  Knowledge deficit related to basic heart failure pathophysiology and self care management- needs reinforcement for HF action plan, symptom management Does not adhere to provider recommendations re: low sodium diet, sometimes eats fast food, does not exercise, feels he is not able, stays tired, pt reports he checks CBG once daily with most recent AIC 5.8, CBG today 119 with ranges 100-140. Checks blood pressure on occasion, has had no further GI bleeding (pt states it was due to hemorrhoids)  Patient reports Hgb checked yesterday 10.5  Patient does continue to weigh daily with weight today 460 pounds. Lacks social connections- pt lives alone, has a friend he can call if needed, does not communicate much with his family, gets out and drives to the grocery store and flea market to talk with friends,  pt continues to work with LCSW for depression management. Case Manager Clinical Goal(s):  patient will weigh self daily and record patient will verbalize understanding of Heart Failure Action Plan and when to call doctor patient will take all Heart Failure mediations as prescribed patient will weigh daily and record (notifying MD of 3 lb weight gain over night or 5 lb in a week) Interventions:  Collaboration with [name], MD regarding development and update of comprehensive plan of care as evidenced by provider attestation and co-signature Inter-disciplinary care team collaboration (see longitudinal plan of care) Reinforced education on low sodium diet  Education sent via My Chart - Heart healthy diet Reviewed Heart Failure Action Plan with emphasis on yellow zone Reinforced patient to weigh each morning after emptying bladder Reinforced importance of daily weight and advised patient to weigh and record daily Reviewed role of diuretics in prevention of fluid overload and management of heart failure Reviewed importance of taking all medications, using inhalers as  prescribed Reviewed importance of working with LCSW related to depression Encouraged patient to stay active, try to connect with friends and be as involved with others, community as possible, get outside daily and encouraged to walk if able even if only for a few minutes Reviewed with patient importance of reporting any GI bleeding to doctor Reviewed all upcoming provider appointments- cardiologist 8/24,  primary care provider 9/20, annual wellness visit 10/24, face to face visit with pharmacist 8/30. Self-Care Activities: Attends all scheduled provider appointments Attends church or other social activities Calls provider office for new concerns or questions Patient Goals: - call office if I gain more than 3 pounds in one day or 5 pounds in one week- follow heart failure action plan, know how you are feeling each day - do ankle pumps when sitting - keep legs up while sitting - weigh daily and write weight in diary - follow low salt diet- avoid salty snacks and fast food - watch for swelling in feet, ankles and legs every day - please read over heart healthy diet education sent via My Chart - take all medications as prescribed Follow Up Plan: Telephone follow up appointment with care management team member scheduled for:  02/03/2021    Care Plan : COPD (Adult)  Updates made by Kassie Mends, RN since 01/06/2021 12:00 AM     Problem: Symptom Exacerbation (COPD)   Priority: High     Long-Range Goal: Symptom Exacerbation Prevented or Minimized   Start Date: 12/07/2020  Expected End Date: 06/09/2021  This Visit's Progress: On track  Recent Progress: On track  Priority: High  Note:   Current Barriers:  Knowledge deficits related to basic understanding of COPD disease process- needs reinforcement of COPD action plan, symptom management, pt uses CPAP, checks 02 sat as needed.  Patient reports he got diabetic shoes 12/30/20. Knowledge deficits related to basic COPD self care/management-  does not have advanced directives, declined information Knowledge deficit related to importance of energy conservation- pt gets tired easily, does not always sleep well, can benefit from light exercise, has worked with home health PT [place] - lives alone, has friend he can call on, has depression PHQ-9=14, is working with LCSW for resources/ counseling, pt reports he tried outpatient counseling in the past and "it did not work very well" Case Electrical engineer): patient will report using inhalers as prescribed including rinsing mouth after use patient will verbalize understanding of COPD action plan and when to seek appropriate levels of  medical care patient will engage in lite exercise as tolerated to build/regain stamina and strength and reduce shortness of breath through activity tolerance patient will verbalize basic understanding of COPD disease process and self care activities  Interventions:  Collaboration with [name], MD regarding development and update of comprehensive plan of care as evidenced by provider attestation and co-signature Inter-disciplinary care team collaboration (see longitudinal plan of care) Provided patient with education heart healthy diet and carbohydrate modified diet (via My Chart) Reviewed medications used for management of COPD including inhalers Reinforced with patient to self assesses COPD action plan zone and make appointment with provider if in the yellow zone for 48 hours without improvement. Reinforced with patient education about the role of exercise in the management of COPD- ask pt to continue to complete exercises prescribed by PT Advised patient to engage in light exercise as tolerated 3-5 days a week and practice energy conservation Encouraged pt to limit day time naps to help with night time sleep Encouraged pt to continue meeting with friends at flea market and connecting with others to help with depression Reinforced  importance of working with LCSW for depression management Self-Care Activities:  Attends all scheduled provider appointments Attends church or other social activities Calls provider office for new concerns or questions Patient Goals: - Continue to follow COPD rescue plan - eliminate symptom triggers at home - follow rescue plan if symptoms flare-up, call doctor early on if not feeling well - keep follow-up appointments - see cardiologist 8/24, 8/30 pharmacist, 9/20 primary care doctor - continue to use CPAP as prescribed - take all medications and use inhalers as prescribed - please read over Diabetes diet information sent via My Chart - please continue to work with Education officer, museum for management of depression - develop a new routine to improve sleep - get at least 7 to 8 hours of sleep at night - get outdoors every day (weather permitting) - keep room cool and dark Follow Up Plan: Telephone follow up appointment with care management team member scheduled for:  02/03/2021      Plan:Telephone follow up appointment with care management team member scheduled for:  02/03/2021  Jacqlyn Larsen [facility], BSN RN Case Manager [facility] [phone]

## 2021-01-06 NOTE — Patient Instructions (Signed)
PATIENT GOALS:  Goals Addressed             This Visit's Progress    Track and Manage Fluids and Swelling-Heart Failure       Timeframe:  Long-Range Goal Priority:  High Start Date:               12/07/2020              Expected End Date:     06/09/2021                  Follow Up Date - 02/03/2021   - call office if I gain more than 3 pounds in one day or 5 pounds in one week- follow heart failure action plan, know how you are feeling each day - do ankle pumps when sitting - keep legs up while sitting - weigh daily and write weight in diary - follow low salt diet- avoid salty snacks and fast food - watch for swelling in feet, ankles and legs every day - please read over heart healthy diet education sent via My Chart - take all medications as prescribed   Why is this important?   It is important to check your weight daily and watch how much salt and liquids you have.  It will help you to manage your heart failure.    Notes:      Track and Manage My Symptoms-COPD       Timeframe:  Long-Range Goal Priority:  High Start Date:      12/07/2020                       Expected End Date:     06/09/2021                  Follow Up Date - 02/03/2021   - Continue to follow COPD rescue plan - eliminate symptom triggers at home - follow rescue plan if symptoms flare-up, call doctor early on if not feeling well - keep follow-up appointments - see cardiologist 8/24, 8/30 pharmacist, 9/20 primary care doctor - continue to use CPAP as prescribed - take all medications and use inhalers as prescribed - please read over Diabetes diet information sent via My Chart - please continue to work with Education officer, [employer] for management of depression   Why is this important?   Tracking your symptoms and other information about your health helps your doctor plan your care.  Write down the symptoms, the time of day, what you were doing and what medicine you are taking.  You will soon learn how to manage  your symptoms.     Notes:         Patient verbalizes understanding of instructions provided today and agrees to view in [place].   Telephone follow up appointment with care management team member scheduled for:  02/03/2021  Jacqlyn Larsen [facility], BSN RN Case Manager [facility] [phone]    Diabetes Mellitus and Nutrition, Adult When you have diabetes, or diabetes mellitus, it is very important to have healthy eating habits because your blood sugar (glucose) levels are greatly affected by what you eat and drink. Eating healthy foods in the right amounts, at about the same times every day, can help you: Control your blood glucose. Lower your risk of heart disease. Improve your blood pressure. Reach or maintain a healthy weight. What can affect my meal plan? Every person with diabetes is different, and each person has  different needs for a meal plan. Your health care provider may recommend that you work with a dietitian to make a meal plan that is best for you. Your meal plan may vary depending on factors such as: The calories you need. The medicines you take. Your weight. Your blood glucose, blood pressure, and cholesterol levels. Your activity level. Other health conditions you have, such as heart or kidney disease. How do carbohydrates affect me? Carbohydrates, also called carbs, affect your blood glucose level more than any other type of food. Eating carbs naturally raises the amount of glucose in your blood. Carb counting is a method for keeping track of how many carbs you eat. Counting carbs is important to keep your blood glucose at a healthy level,especially if you use insulin or take certain oral diabetes medicines. It is important to know how many carbs you can safely have in each meal. This is different for every person. Your dietitian can help you calculate how manycarbs you should have at each meal and for each snack. How does alcohol affect me? Alcohol can  cause a sudden decrease in blood glucose (hypoglycemia), especially if you use insulin or take certain oral diabetes medicines. Hypoglycemia can be a life-threatening condition. Symptoms of hypoglycemia, such as sleepiness, dizziness, and confusion, are similar to symptoms of having too much alcohol. Do not drink alcohol if: Your health care provider tells you not to drink. You are pregnant, may be pregnant, or are planning to become pregnant. If you drink alcohol: Do not drink on an empty stomach. Limit how much you use to: 0-1 drink a day for women. 0-2 drinks a day for men. Be aware of how much alcohol is in your drink. In the [place] one drink equals one 12 oz bottle of beer (355 mL), one 5 oz glass of wine (148 mL), or one 1 oz glass of hard liquor (44 mL). Keep yourself hydrated with water, diet soda, or unsweetened iced tea. Keep in mind that regular soda, juice, and other mixers may contain a lot of sugar and must be counted as carbs. What are tips for following this plan?  Reading food labels Start by checking the serving size on the "Nutrition Facts" label of packaged foods and drinks. The amount of calories, carbs, fats, and other nutrients listed on the label is based on one serving of the item. Many items contain more than one serving per package. Check the total grams (g) of carbs in one serving. You can calculate the number of servings of carbs in one serving by dividing the total carbs by 15. For example, if a food has 30 g of total carbs per serving, it would be equal to 2 servings of carbs. Check the number of grams (g) of saturated fats and trans fats in one serving. Choose foods that have a low amount or none of these fats. Check the number of milligrams (mg) of salt (sodium) in one serving. Most people should limit total sodium intake to less than 2,300 mg per day. Always check the nutrition information of foods labeled as "low-fat" or "nonfat." These foods may be higher in  added sugar or refined carbs and should be avoided. Talk to your dietitian to identify your daily goals for nutrients listed on the label. Shopping Avoid buying canned, pre-made, or processed foods. These foods tend to be high in fat, sodium, and added sugar. Shop around the outside edge of the grocery store. This is where you will most often find  fresh fruits and vegetables, bulk grains, fresh meats, and fresh dairy. Cooking Use low-heat cooking methods, such as baking, instead of high-heat cooking methods like deep frying. Cook using healthy oils, such as olive, canola, or sunflower oil. Avoid cooking with butter, cream, or high-fat meats. Meal planning Eat meals and snacks regularly, preferably at the same times every day. Avoid going long periods of time without eating. Eat foods that are high in fiber, such as fresh fruits, vegetables, beans, and whole grains. Talk with your dietitian about how many servings of carbs you can eat at each meal. Eat 4-6 oz (112-168 g) of lean protein each day, such as lean meat, chicken, fish, eggs, or tofu. One ounce (oz) of lean protein is equal to: 1 oz (28 g) of meat, chicken, or fish. 1 egg.  cup (62 g) of tofu. Eat some foods each day that contain healthy fats, such as avocado, nuts, seeds, and fish. What foods should I eat? Fruits Berries. Apples. Oranges. Peaches. Apricots. Plums. Grapes. Mango. Papaya.Pomegranate. Kiwi. Cherries. Vegetables Lettuce. Spinach. Leafy greens, including kale, chard, collard greens, and mustard greens. Beets. Cauliflower. Cabbage. Broccoli. Carrots. Green beans.Tomatoes. Peppers. Onions. Cucumbers. Brussels sprouts. Grains Whole grains, such as whole-wheat or whole-grain bread, crackers, tortillas,cereal, and pasta. Unsweetened oatmeal. Quinoa. Brown or wild rice. Meats and other proteins Seafood. Poultry without skin. Lean cuts of poultry and beef. Tofu. Nuts. Seeds. Dairy Low-fat or fat-free dairy products such as  milk, yogurt, and cheese. The items listed above may not be a complete list of foods and beverages you can eat. Contact a dietitian for more information. What foods should I avoid? Fruits Fruits canned with syrup. Vegetables Canned vegetables. Frozen vegetables with butter or cream sauce. Grains Refined white flour and flour products such as bread, pasta, snack foods, andcereals. Avoid all processed foods. Meats and other proteins Fatty cuts of meat. Poultry with skin. Breaded or fried meats. Processed meat.Avoid saturated fats. Dairy Full-fat yogurt, cheese, or milk. Beverages Sweetened drinks, such as soda or iced tea. The items listed above may not be a complete list of foods and beverages you should avoid. Contact a dietitian for more information. Questions to ask a health care provider Do I need to meet with a diabetes educator? Do I need to meet with a dietitian? What number can I call if I have questions? When are the best times to check my blood glucose? Where to find more information: American Diabetes Association: diabetes.org Academy of Nutrition and Dietetics: www.eatright.[employer] of Diabetes and Digestive and Kidney Diseases: [place] Association of Diabetes Care and Education Specialists: www.diabeteseducator.org Summary It is important to have healthy eating habits because your blood sugar (glucose) levels are greatly affected by what you eat and drink. A healthy meal plan will help you control your blood glucose and maintain a healthy lifestyle. Your health care provider may recommend that you work with a dietitian to make a meal plan that is best for you. Keep in mind that carbohydrates (carbs) and alcohol have immediate effects on your blood glucose levels. It is important to count carbs and to use alcohol carefully. This information is not intended to replace advice given to you by your health care provider. Make sure you discuss any questions  you have with your healthcare provider. Document Revised: 04/15/2019 Document Reviewed: 04/15/2019 Elsevier Patient Education  2021 [place]. Heart-Healthy Eating Plan Heart-healthy meal planning includes: Eating less unhealthy fats. Eating more healthy fats. Making other changes in your diet. Talk  with your doctor or a diet specialist (dietitian) to create an eating plan that is right for you. What is my plan? Your doctor may recommend an eating plan that includes: Total fat: ______% or less of total calories a day. Saturated fat: ______% or less of total calories a day. Cholesterol: less than _________mg a day. What are tips for following this plan? Cooking Avoid frying your food. Try to bake, boil, grill, or broil it instead. You can also reduce fat by: Removing the skin from poultry. Removing all visible fats from meats. Steaming vegetables in water or broth. Meal planning  At meals, divide your plate into four equal parts: Fill one-half of your plate with vegetables and green salads. Fill one-fourth of your plate with whole grains. Fill one-fourth of your plate with lean protein foods. Eat 4-5 servings of vegetables per day. A serving of vegetables is: 1 cup of raw or cooked vegetables. 2 cups of raw leafy greens. Eat 4-5 servings of fruit per day. A serving of fruit is: 1 medium whole fruit.  cup of dried fruit.  cup of fresh, frozen, or canned fruit.  cup of 100% fruit juice. Eat more foods that have soluble fiber. These are apples, broccoli, carrots, beans, peas, and barley. Try to get 20-30 g of fiber per day. Eat 4-5 servings of nuts, legumes, and seeds per week: 1 serving of dried beans or legumes equals  cup after being cooked. 1 serving of nuts is  cup. 1 serving of seeds equals 1 tablespoon.  General information Eat more home-cooked food. Eat less restaurant, buffet, and fast food. Limit or avoid alcohol. Limit foods that are high in starch and  sugar. Avoid fried foods. Lose weight if you are overweight. Keep track of how much salt (sodium) you eat. This is important if you have high blood pressure. Ask your doctor to tell you more about this. Try to add vegetarian meals each week. Fats Choose healthy fats. These include olive oil and canola oil, flaxseeds, walnuts, almonds, and seeds. Eat more omega-3 fats. These include salmon, mackerel, sardines, tuna, flaxseed oil, and ground flaxseeds. Try to eat fish at least 2 times each week. Check food labels. Avoid foods with trans fats or high amounts of saturated fat. Limit saturated fats. These are often found in animal products, such as meats, butter, and cream. These are also found in plant foods, such as palm oil, palm kernel oil, and coconut oil. Avoid foods with partially hydrogenated oils in them. These have trans fats. Examples are stick margarine, some tub margarines, cookies, crackers, and other baked goods. What foods can I eat? Fruits All fresh, canned (in natural juice), or frozen fruits. Vegetables Fresh or frozen vegetables (raw, steamed, roasted, or grilled). Green salads. Grains Most grains. Choose whole wheat and whole grains most of the time. Rice andpasta, including brown rice and pastas made with whole wheat. Meats and other proteins Lean, well-trimmed beef, veal, pork, and lamb. Chicken and [place] without skin. All fish and shellfish. Wild duck, rabbit, pheasant, and venison. Egg whites or low-cholesterol egg substitutes. Dried beans, peas, lentils, and tofu. Seedsand most nuts. Dairy Low-fat or nonfat cheeses, including ricotta and mozzarella. Skim or 1% milk that is liquid, powdered, or evaporated. Buttermilk that is made with low-fatmilk. Nonfat or low-fat yogurt. Fats and oils Non-hydrogenated (trans-free) margarines. Vegetable oils, including soybean, sesame, sunflower, olive, peanut, safflower, corn, canola, and cottonseed. Salad dressings or mayonnaisemade  with a vegetable oil. Beverages Mineral water. Coffee and  tea. Diet carbonated beverages. Sweets and desserts Sherbet, gelatin, and fruit ice. Small amounts of dark chocolate. Limit all sweets and desserts. Seasonings and condiments All seasonings and condiments. The items listed above may not be a complete list of foods and drinks you can eat. Contact a dietitian for more options. What foods should I avoid? Fruits Canned fruit in heavy syrup. Fruit in cream or butter sauce. Fried fruit. Limitcoconut. Vegetables Vegetables cooked in cheese, cream, or butter sauce. Fried vegetables. Grains Breads that are made with saturated or trans fats, oils, or whole milk. Croissants. Sweet rolls. Donuts. High-fat crackers,such as cheese crackers. Meats and other proteins Fatty meats, such as hot dogs, ribs, sausage, bacon, rib-eye roast or steak. High-fat deli meats, such as salami and bologna. Caviar. Domestic duck andgoose. Organ meats, such as liver. Dairy Cream, sour cream, cream cheese, and creamed cottage cheese. Whole-milk cheeses. Whole or 2% milk that is liquid, evaporated, or condensed. Whole buttermilk. Cream sauce or high-fat cheese sauce. Yogurt that is made fromwhole milk. Fats and oils Meat fat, or shortening. Cocoa butter, hydrogenated oils, palm oil, coconut oil, palm kernel oil. Solid fats and shortenings, including bacon fat, salt pork, lard, and butter. Nondairy cream substitutes. Salad dressings with cheeseor sour cream. Beverages Regular sodas and juice drinks with added sugar. Sweets and desserts Frosting. Pudding. Cookies. Cakes. Pies. Milk chocolate or white chocolate.Buttered syrups. Full-fat ice cream or ice cream drinks. The items listed above may not be a complete list of foods and drinks to avoid. Contact a dietitian for more information. Summary Heart-healthy meal planning includes eating less unhealthy fats, eating more healthy fats, and making other changes in your  diet. Eat a balanced diet. This includes fruits and vegetables, low-fat or nonfat dairy, lean protein, nuts and legumes, whole grains, and heart-healthy oils and fats. This information is not intended to replace advice given to you by your health care provider. Make sure you discuss any questions you have with your healthcare provider. Document Revised: 07/12/2017 Document Reviewed: 06/15/2017 Elsevier Patient Education  2022 [place]   Jacqlyn Larsen [facility], BSN RN Case Advertising copywriter Primary Care [phone]

## 2021-01-06 NOTE — Telephone Encounter (Signed)
Patient called about blood work from [facility] and said his hemoglobin was not normal, asked for nurse to give him a call [phone]

## 2021-01-07 NOTE — Telephone Encounter (Signed)
Left message

## 2021-01-10 NOTE — Telephone Encounter (Signed)
Per epic notes, patient was notified of lab results and hemoglobin by the ordering providers office

## 2021-01-11 NOTE — Progress Notes (Signed)
Cardiology Office Note    Date:  01/12/2021   ID:  Reginald [name], DOB 1970/10/23, MRN [id number]  PCP:  [name], MD  Cardiologist: Jenkins Rouge, MD   EP: Dr. Rayann Heman  Chief Complaint  Patient presents with   Follow-up    3 month visit    History of Present Illness:    Reginald [name] is a 50 y.o. male  with past medical history of severe AS (s/p bovine tissue AVR in 08/2017 with repair of ascending thoracic aortic aneurysm), normal coronary arteries by cardiac catheterization in 2019, paroxysmal atrial fibrillation, CHB (s/p St. Jude PPM placement in 08/2017), history of bilateral PE, HTN, morbid obesity and OSA who presents to the office today for 21-monthfollow-up.  He was examined by myself in 09/2020 and reported having worsening fatigue over the past few months along with baseline dyspnea on exertion. He did appear volume overloaded on examination and it was recommended to continue Torsemide 40 mg twice daily and to take Metolazone at least once a week.   In the interim, [facility] of worsening weakness and black stools per his report. He was evaluated by GI and his black stools were felt to be secondary to iron supplementation and hematochezia due to hemorrhoidal bleeding.  He underwent EGD and colonoscopy which were both unremarkable except for hemorrhoids and was placed on a stool softener along with being continued on iron supplementation. He did follow-up with EP in 11/2020 and his pacemaker was functioning normally at that time.   He did call the office in 11/2020 reporting an acute weight gain and was advised to take additional Metolazone and follow-up labs on 12/20/2020 showed his creatinine had increased to 2.28. Repeat labs on 01/05/2021 showed creatinine had improved to 1.18 and hemoglobin remained low at 10.5 but was similar to prior values  In talking with the patient today, he reports having baseline dyspnea on exertion but  no reported exertional chest pain. He does use a CPAP at night. He was previously having lower extremity edema with associated blistering but the symptoms have improved. His weight has been stable in the 450's to 460's at home (at 471 lbs on the office scales today) and says his weight can vary by 5 to 10 pounds during the day.  is hemoglobin was at 10.5 on 01/05/2021 but he denies any recurrent melena or hematochezia.  Past Medical History:  Diagnosis Date   Anxiety    Aortic stenosis    Arthritis    Asthma    Back pain    Cellulitis of skin with lymphangitis    CHF (congestive heart failure) (HCC)    Chronic diastolic congestive heart failure (HCC)    Chronic venous insufficiency    Constipation    COPD (chronic obstructive pulmonary disease) (HCC)    Depression    Diabetes ([place]    Dyspnea    Essential hypertension    Gout    Heart murmur    Hypertension    Morbid obesity ([place]    Obesity    Pneumonia    walking pneumonia   Prostatitis    [place]ulmonary [place]ism ([place]    Pulmonary hypertension (HCC)    Pyelonephritis    S/P aortic valve replacement with bioprosthetic valve 08/23/2017   25 mm Edwards Inspiris Resilia stented bovine pericardial tissue valve   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   24 mm Hemashield supracoronary straight graft  Sleep apnea    cpap    Thoracic ascending aortic aneurysm [facility])    Thoracic ascending aortic aneurysm ([place])    Tobacco abuse     Past Surgical History:  Procedure Laterality Date   AORTIC VALVE REPLACEMENT N/A 08/23/2017   Procedure: AORTIC VALVE REPLACEMENT (AVR) USING INSPIRIS RESILIA AORTIC VALVE SIZE 25 MM;  Surgeon: Rexene Alberts, MD;  Location: [place];  Service: Open Heart Surgery;  Laterality: N/A;   CARDIAC VALVE REPLACEMENT N/A    Phreesia 02/07/2020   COLONOSCOPY WITH PROPOFOL N/A 11/14/2020   Procedure: COLONOSCOPY WITH PROPOFOL;  Surgeon: Gatha Mayer, MD;  [facility] ENDOSCOPY;  Service: Endoscopy;   Laterality: N/A;   ESOPHAGOGASTRODUODENOSCOPY (EGD) WITH PROPOFOL N/A 11/14/2020   Procedure: ESOPHAGOGASTRODUODENOSCOPY (EGD) WITH PROPOFOL;  Surgeon: Gatha Mayer, MD;  Location: [place];  Service: Endoscopy;  Laterality: N/A;   MULTIPLE EXTRACTIONS WITH ALVEOLOPLASTY N/A 07/23/2017   Procedure: Extraction of tooth #10 with alveoloplasty and gross debridement of remaining teeth;  Surgeon: Lenn Cal, DDS;  Location: [place];  Service: Oral Surgery;  Laterality: N/A;   PACEMAKER IMPLANT N/A 08/28/2017    St Jude Medical Assurity MRI conditional  dual-chamber pacemaker for symptomatic complete heart blockby Dr Rayann Heman   TEE WITHOUT CARDIOVERSION N/A 07/16/2017   Procedure: TRANSESOPHAGEAL ECHOCARDIOGRAM (TEE);  Surgeon: Lelon Perla, MD;  Location: [facility] ENDOSCOPY;  Service: Cardiovascular;  Laterality: N/A;   TEE WITHOUT CARDIOVERSION N/A 08/23/2017   Procedure: TRANSESOPHAGEAL ECHOCARDIOGRAM (TEE);  Surgeon: Rexene Alberts, MD;  Location: [place];  Service: Open Heart Surgery;  Laterality: N/A;   THORACIC AORTIC ANEURYSM REPAIR N/A 08/23/2017   Procedure: THORACIC ASCENDING ANEURYSM REPAIR (AAA) USING HEMASHIELD GOLD KNITTED MICROVEL DOUBLE VELOUR VASCULAR GRAFT D: 24 MM  L: 30 CM;  Surgeon: Rexene Alberts, MD;  Location: [place];  Service: Open Heart Surgery;  Laterality: N/A;    Current Medications: Outpatient Medications Prior to Visit  Medication Sig Dispense Refill   acetaminophen (TYLENOL) 325 MG tablet Take 650 mg by mouth every 6 (six) hours as needed for mild pain.     albuterol (PROVENTIL) (2.5 MG/3ML) 0.083% nebulizer solution INHALE 1 VIAL VIA NEBULIZER EVERY 6 HOURS AS NEEDED FOR WHEEZING OR SHORTNESS OF BREATH 270 mL 1   albuterol (VENTOLIN HFA) 108 (90 Base) MCG/ACT inhaler INHALE 2 PUFFS INTO THE LUNGS EVERY 6 (SIX) HOURS AS NEEDED FOR WHEEZING OR SHORTNESS OF BREATH. 1 each 1   allopurinol (ZYLOPRIM) 300 MG tablet TAKE 1 TABLET EVERY DAY 90 tablet 0   ammonium  lactate (AMLACTIN) 12 % lotion Apply 1 application topically as needed for dry skin. 400 g 0   atorvastatin (LIPITOR) 40 MG tablet TAKE 1 TABLET EVERY DAY 90 tablet 0   Blood Glucose Monitoring Suppl (TRUE METRIX METER) w/Device KIT USE AS DIRECTED 1 kit 0   busPIRone (BUSPAR) 10 MG tablet TAKE 1 TABLET THREE TIMES DAILY 270 tablet 0   citalopram (CELEXA) 40 MG tablet TAKE 1 TABLET EVERY DAY 90 tablet 0   diclofenac Sodium (VOLTAREN) 1 % GEL Apply 2 g topically 4 (four) times daily. 150 g 1   docusate sodium (COLACE) 100 MG capsule Take 2 capsules (200 mg total) by mouth 2 (two) times daily. 30 capsule 0   FEROSUL 325 (65 Fe) MG tablet TAKE 1 TABLET EVERY DAY 90 tablet 1   fexofenadine (ALLEGRA) 180 MG tablet Take 180 mg by mouth daily.     gabapentin (NEURONTIN) 100 MG  capsule Take 1 capsule in the morning and 2 capsules in the evening. 90 capsule 0   Krill Oil 350 MG CAPS Take 1 capsule by mouth in the morning and at bedtime.      lisinopril (ZESTRIL) 20 MG tablet Take 1 tablet (20 mg total) by mouth daily. 90 tablet 3   metFORMIN (GLUCOPHAGE) 850 MG tablet Take 1 tablet (850 mg total) by mouth 2 (two) times daily with a meal. 180 tablet 1   metolazone (ZAROXOLYN) 5 MG tablet PRN (Patient taking differently: Take 5 mg by mouth once a week. PRN) 90 tablet 3   metoprolol tartrate (LOPRESSOR) 100 MG tablet TAKE 1 TABLET TWICE DAILY 180 tablet 3   pantoprazole (PROTONIX) 40 MG tablet Take 1 tablet (40 mg total) by mouth daily. 90 tablet 3   polyethylene glycol (MIRALAX) 17 g packet Take 17 g by mouth daily as needed. 14 each 0   potassium chloride SA (KLOR-CON) 20 MEQ tablet TAKE 3 TABS DAILY AND TAKE AN ADDITIONAL 1 TAB ON THE DAY YOU TAKE YOUR WEEKLY METOLAZONE DOSE 283 tablet 3   rivaroxaban (XARELTO) 20 MG TABS tablet TAKE 1 Tablet BY MOUTH ONCE EVERY DAY WITH SUPPER 90 tablet 3   Semaglutide (RYBELSUS) 14 MG TABS Take 14 mg by mouth in the morning.     terbinafine (LAMISIL) 250 MG tablet Take  1 tablet by mouth once daily 28 tablet 0   torsemide (DEMADEX) 20 MG tablet Take 40 mg in the AM and Take 20 mg in the PM (Patient taking differently: Take 40 mg by mouth 2 (two) times daily.) 90 tablet 11   TRUE METRIX BLOOD GLUCOSE TEST test strip TEST ONE TIME DAILY FOR DIABETES 100 strip 5   TRUEplus Lancets 33G MISC USE TO TEST ONE TIME DAILY FOR DIABETES 100 each 5   loratadine (CLARITIN) 10 MG tablet Take 10 mg by mouth daily as needed for allergies. (Patient not taking: Reported on 01/12/2021)     No facility-administered medications prior to visit.     Allergies:   Patient has no known allergies.   Social History   Socioeconomic History   Marital status: Divorced    Spouse name: Not on file   Number of children: Not on file   Years of education: Not on file   Highest education level: Not on file  Occupational History   Not on file  Tobacco Use   Smoking status: Former    Packs/day: 2.00    Years: 16.00    Pack years: 32.00    Types: Cigarettes    Quit date: 08/23/2017    Years since quitting: 3.3   Smokeless tobacco: Never  Vaping Use   Vaping Use: Never used  Substance and Sexual Activity   Alcohol use: No    Comment: have had alcohol in the past, not heavy   Drug use: No   Sexual activity: Not Currently    Partners: Female  Other Topics Concern   Not on file  Social History Narrative   Not on file   Social Determinants of Health   Financial Resource Strain: Low Risk    Difficulty of Paying Living Expenses: Not very hard  Food Insecurity: No Food Insecurity   Worried About Charity fundraiser in the Last Year: Never true   [place] in the Last Year: Never true  Transportation Needs: No Transportation Needs   Lack of Transportation (Medical): No   Lack of Transportation (Non-Medical):  No  Physical Activity: Inactive   Days of Exercise per Week: 0 days   Minutes of Exercise per Session: 0 min  Stress: Stress Concern Present   Feeling of Stress :  Rather much  Social Connections: Socially Isolated   Frequency of Communication with Friends and Family: More than three times a week   Frequency of Social Gatherings with Friends and Family: Once a week   Attends Religious Services: Never   Marine scientist or Organizations: No   Attends Music therapist: Never   Marital Status: Divorced     Family History:  [name] family history includes Alzheimer's disease in his mother; Diabetes in his brother; Heart attack in his brother and mother; Hypertension in his mother.   Review of Systems:    Please see the history of present illness.     All other systems reviewed and are otherwise negative except as noted above.   Physical Exam:    VS:  BP 128/70   Pulse 69   Ht 5' 11" (1.803 m)   Wt (!) 471 lb (213.6 kg)   SpO2 97%   BMI 65.69 kg/m    General: Pleasant obese male appearing in no acute distress. Head: Normocephalic, atraumatic. Neck: No carotid bruits. JVD not elevated.  Lungs: Respirations regular and unlabored, without wheezes or rales.  Heart: Regular rate and rhythm. No S3 or S4.  No murmur, no rubs, or gallops appreciated. Abdomen: Appears non-distended. No obvious abdominal masses. Msk:  Strength and tone appear normal for age. No obvious joint deformities or effusions. Extremities: No clubbing or cyanosis. No pitting edema.  Distal pedal pulses are 2+ bilaterally. Neuro: Alert and oriented X 3. Moves all extremities spontaneously. No focal deficits noted. Psych:  Responds to questions appropriately with a normal affect. Skin: No rashes or lesions noted  Wt Readings from Last 3 Encounters:  01/12/21 (!) 471 lb (213.6 kg)  11/26/20 (!) 463 lb 12.8 oz (210.4 kg)  11/26/20 (!) 470 lb (213.2 kg)     Studies/Labs Reviewed:   EKG:  EKG is not ordered today.   Recent Labs: 10/04/2020: TSH 4.060 11/15/2020: Magnesium 2.2 11/24/2020: ALT 27 12/20/2020: B Natriuretic Peptide 62.0 01/05/2021: BUN 24;  Creatinine, Ser 1.18; Hemoglobin 10.5; Platelets 261; Potassium 3.7; Sodium 137   Lipid Panel    Component Value Date/Time   CHOL 123 06/10/2020 0837   TRIG 281 (H) 06/10/2020 0837   HDL 25 (L) 06/10/2020 0837   CHOLHDL 4.9 06/10/2020 0837   CHOLHDL 6.6 01/01/2020 1014   VLDL 73 (H) 01/01/2020 1014   LDLCALC 54 06/10/2020 0837    Additional studies/ records that were reviewed today include:   Echocardiogram: 07/2020 IMPRESSIONS     1. Images are limited despite use of Definity.   2. Left ventricular ejection fraction, by estimation, is 60 to 65%. The  left ventricle has normal function. Left ventricular endocardial border  not optimally defined to evaluate regional wall motion. There is mild left  ventricular hypertrophy. Left  ventricular diastolic parameters are indeterminate. Elevated left atrial  pressure.   3. Right ventricular systolic function is mildly reduced. The right  ventricular size is normal. There is normal pulmonary artery systolic  pressure. The estimated right ventricular systolic pressure is 47.0 mmHg.  Device wire present.   4. The mitral valve is grossly normal. Trivial mitral valve  regurgitation.   5. The aortic valve has been repaired/replaced. Aortic valve  regurgitation is not visualized. There is  a 25 mm Edwards valve present in  the aortic position. Aortic valve mean gradient measures 26.0 mmHg. Mean  gradient was 23 mmHg in 2019, relatively  stable - could be suggestive of patient/prosthesis mismatch. Poorly  visualized.   6. The inferior vena cava is dilated in size with >50% respiratory  variability, suggesting right atrial pressure of 8 mmHg.   Assessment:    1. Chronic diastolic (congestive) heart failure ([place])   2. Hx of aortic valve replacement   3. Medication management   4. Paroxysmal atrial fibrillation (HCC)   5. CHB (complete heart block) (HCC)   6. Anemia, unspecified type      Plan:   In order of problems listed  above:  1. HFpEF - His fluid status has overall been well-controlled recently and his weight is at 471 lbs on the office scales today. His creatinine had improved to 1.18 by labs last week. Will continue Torsemide at current dosing of 40 mg twice daily as he previously experienced worsening weight gain and edema with dose reduction. He does take Metolazone approximately once a week and is aware to take this if needed for acute weight gain. - It was previously reviewed with the pharmacist at his PCP's office to consider adding an SGLT2 inhibitor and I think this is certainly a reasonable option for the patient given improvement in his renal function. Will start Jardiance 10 mg daily and samples were provided today.  2. Severe AS - He is s/p bovine tissue AVR in 08/2017 with repair of ascending thoracic aortic aneurysm.  Most recent echocardiogram in 07/2020 showed a mean gradient of 26 mmHg which was similar to prior imaging. Will continue to follow with plans for repeat echocardiogram next year.  3. Paroxysmal Atrial Fibrillation - He does experience occasional palpitations and remains on Lopressor 100 mg twice daily for rate control. He denies any evidence of active bleeding but does have a known anemia as outlined below. He remains on Xarelto 20 mg daily for anticoagulation.  4. CHB - He is s/p St. Jude PPM placement in 08/2017.  He is followed by EP and sensitivity margins were adjusted at the time of his office visit in 11/2020.  5. Anemia - His hemoglobin was at 10.5 by most recent check last week. He recently underwent a thorough GI evaluation 10/2020 with endoscopy and colonoscopy being unremarkable except for hemorrhoids noted. He does have scheduled follow-up with GI in 02/2021. Will recheck a CBC with his upcoming BMET next month. We reviewed that if his hemoglobin continues to decline, he may benefit from Hematology referral as he is on iron supplementation already.    Medication  Adjustments/Labs and Tests Ordered: Current medicines are reviewed at length with the patient today.  Concerns regarding medicines are outlined above.  Medication changes, Labs and Tests ordered today are listed in the Patient Instructions below. Patient Instructions  Medication Instructions:   Start Jardiance 56m daily  Labwork:  CBC and BMET in 4-5 weeks. You do not need to be fasting.   Testing/Procedures:  None  Follow-Up:  With BMaur[place]ia PA-C in 3-4 months.   Any Other Special Instructions Will Be Listed Below (If Applicable).     If you need a refill on your cardiac medications before your next appointment, please call your pharmacy.   Signed, BErma Heritage PA-C  01/12/2021 4:43 PM    [facility] [address] [place] [id number]: [phone]: [phone]

## 2021-01-12 ENCOUNTER — Ambulatory Visit: Payer: Medicare HMO | Admitting: Pharmacist

## 2021-01-12 ENCOUNTER — Encounter: Payer: Self-pay | Admitting: Student

## 2021-01-12 ENCOUNTER — Other Ambulatory Visit: Payer: Self-pay

## 2021-01-12 ENCOUNTER — Ambulatory Visit: Payer: Medicare HMO | Admitting: Student

## 2021-01-12 VITALS — BP 128/70 | HR 69 | Ht 71.0 in | Wt >= 6400 oz

## 2021-01-12 DIAGNOSIS — I442 Atrioventricular block, complete: Secondary | ICD-10-CM

## 2021-01-12 DIAGNOSIS — I5032 Chronic diastolic (congestive) heart failure: Secondary | ICD-10-CM

## 2021-01-12 DIAGNOSIS — J449 Chronic obstructive pulmonary disease, unspecified: Secondary | ICD-10-CM

## 2021-01-12 DIAGNOSIS — I10 Essential (primary) hypertension: Secondary | ICD-10-CM

## 2021-01-12 DIAGNOSIS — Z79899 Other long term (current) drug therapy: Secondary | ICD-10-CM

## 2021-01-12 DIAGNOSIS — D649 Anemia, unspecified: Secondary | ICD-10-CM

## 2021-01-12 DIAGNOSIS — Z952 Presence of prosthetic heart valve: Secondary | ICD-10-CM | POA: Diagnosis not present

## 2021-01-12 DIAGNOSIS — I480 Paroxysmal atrial fibrillation: Secondary | ICD-10-CM

## 2021-01-12 DIAGNOSIS — E1169 Type 2 diabetes mellitus with other specified complication: Secondary | ICD-10-CM

## 2021-01-12 MED ORDER — EMPAGLIFLOZIN 10 MG PO TABS: 10.0000 mg | ORAL_TABLET | Freq: Every day | ORAL | 11 refills | Status: DC

## 2021-01-12 NOTE — Patient Instructions (Signed)
Medication Instructions:   Start Jardiance '10mg'$  daily  Labwork:  CBC and BMET in 4-5 weeks. You do not need to be fasting.   Testing/Procedures:  None  Follow-Up:  With [place], PA-C in 3-4 months.   Any Other Special Instructions Will Be Listed Below (If Applicable).     If you need a refill on your cardiac medications before your next appointment, please call your pharmacy.

## 2021-01-12 NOTE — Chronic Care Management (AMB) (Signed)
Chronic Care Management Pharmacy Note  01/12/2021 Name:  Reginald [name] MRN:  [id number] DOB:  Aug 18, 1970  Summary: Received call from patient stating that he only has 2 tablets of Rybelsus left and that he confirmed with the [employer] Patient Assistance Program that they have not received completed paperwork for refill. Discussed with PCP and fax was received from [employer] stating incomplete documentation for refill.  Paperwork has been updated/completed and faxed at 9:09 AM on 01/12/21. Novo Nordisk patient assistance program was called and I asked if expedited shipping was available. However, per the program rules, they were unable to provide expedited shipping. They stated that the patient would likely receive the medication around 12-14 days from now.  Given excellent glycemic control, no major concern for extremely elevated blood glucose in the short term since patient will likely miss several doses. Will make PCP aware to determine if other interventions needed in the short term.  Subjective: Reginald [name] is an 50 y.o. year old male who is a primary patient of [name], MD.  The CCM team was consulted for assistance with disease management and care coordination needs.    Engaged with patient by telephone for follow up visit in response to provider referral for pharmacy case management and/or care coordination services.   Consent to Services:  The patient was given information about Chronic Care Management services, agreed to services, and gave verbal consent prior to initiation of services.  Please see initial visit note for detailed documentation.   Patient Care Team: [name], MD as PCP - General (Internal Medicine) Thompson Grayer, MD as PCP - Electrophysiology (Cardiology) Josue Hector, MD as PCP - Cardiology (Cardiology) Kassie Mends, RN as [place] Management Marcelline Deist, Ranae Pila, LCSW as Social Worker (Licensed Clinical Social  Worker) Beryle Lathe, [facility] (Pharmacist)  Objective:  Lab Results  Component Value Date   CREATININE 1.18 01/05/2021   CREATININE 2.28 (H) 12/20/2020   CREATININE 1.57 (H) 11/24/2020    Lab Results  Component Value Date   HGBA1C 5.4 11/12/2020   Last diabetic Eye exam: No results found for: HMDIABEYEEXA  Last diabetic Foot exam: No results found for: HMDIABFOOTEX      Component Value Date/Time   CHOL 123 06/10/2020 0837   TRIG 281 (H) 06/10/2020 0837   HDL 25 (L) 06/10/2020 0837   CHOLHDL 4.9 06/10/2020 0837   CHOLHDL 6.6 01/01/2020 1014   VLDL 73 (H) 01/01/2020 1014   [place] 54 06/10/2020 0837    Hepatic Function Latest Ref Rng & Units 11/24/2020 11/15/2020 11/14/2020  Total Protein 6.0 - 8.5 g/dL 7.9 8.0 7.8  Albumin 4.0 - 5.0 g/dL 4.3 3.6 3.6  AST 0 - 40 IU/L 41(H) 32 30  ALT 0 - 44 IU/L '27 23 23  ' Alk Phosphatase 44 - 121 IU/L 98 76 72  Total Bilirubin 0.0 - 1.2 mg/dL 0.7 1.4(H) 1.2  Bilirubin, Direct 0.0 - 0.2 mg/dL - - -    Lab Results  Component Value Date/Time   TSH 4.060 10/04/2020 09:43 AM   TSH 5.240 (H) 06/10/2020 08:37 AM   FREET4 1.30 10/04/2020 09:43 AM   FREET4 0.79 (L) 05/03/2018 02:04 PM    CBC Latest Ref Rng & Units 01/05/2021 11/24/2020 11/15/2020  WBC 4.0 - 10.5 K/uL 10.7(H) 8.0 10.9(H)  Hemoglobin 13.0 - 17.0 g/dL 10.5(L) 12.0(L) 10.4(L)  Hematocrit 39.0 - 52.0 % 35.3(L) 40.9 35.1(L)  Platelets 150 - 400 K/uL 261  320 240    No results found for: VD25OH  Clinical ASCVD: No  The ASCVD Risk score Mikey Bussing DC Jr., et al., 2013) failed to calculate for the following reasons:   The valid total cholesterol range is 130 to 320 mg/dL     Social History   Tobacco Use  Smoking Status Former   Packs/day: 2.00   Years: 16.00   Pack years: 32.00   Types: Cigarettes   Quit date: 08/23/2017   Years since quitting: 3.3  Smokeless Tobacco Never   BP Readings from Last 3 Encounters:  11/26/20 138/74  11/26/20 135/75  11/15/20 133/79   Pulse  Readings from Last 3 Encounters:  11/26/20 68  11/26/20 71  11/15/20 60   Wt Readings from Last 3 Encounters:  11/26/20 (!) 463 lb 12.8 oz (210.4 kg)  11/26/20 (!) 470 lb (213.2 kg)  11/12/20 (!) 489 lb (221.8 kg)    Assessment: Review of patient past medical history, allergies, medications, health status, including review of consultants reports, laboratory and other test data, was performed as part of comprehensive evaluation and provision of chronic care management services.   SDOH:  (Social Determinants of Health) assessments and interventions performed:    CCM Care Plan  No Known Allergies  Medications Reviewed Today     Reviewed by Beryle Lathe, [facility] (Pharmacist) on 01/12/21 at [place] List Status: <None>   Medication Order Taking? Sig Documenting Provider Last Dose Status Informant  acetaminophen (TYLENOL) 325 MG tablet [id number] Yes Take 650 mg by mouth every 6 (six) hours as needed for mild pain. [provider] Taking Active Self  albuterol (PROVENTIL) (2.5 MG/3ML) 0.083% nebulizer solution [id number] Yes INHALE 1 VIAL VIA NEBULIZER EVERY 6 HOURS AS NEEDED FOR WHEEZING OR SHORTNESS OF Sara Chu, MD Taking Active Self           Med Note Beryle Lathe   Tue Dec 28, 2020 10:40 AM) Only uses every few weeks when he mows yard  albuterol (VENTOLIN HFA) 108 (90 Base) MCG/ACT inhaler [id number] Yes INHALE 2 PUFFS INTO THE LUNGS EVERY 6 (SIX) HOURS AS NEEDED FOR WHEEZING OR SHORTNESS OF BREATH. [name], MD Taking Active            Med Note Beryle Lathe   Tue Dec 28, 2020 10:41 AM) Uses a few times per week  allopurinol (ZYLOPRIM) 300 MG tablet [id number] Yes TAKE 1 TABLET EVERY DAY [name], MD Taking Active Self  ammonium lactate (AMLACTIN) 12 % lotion [id number] Yes Apply 1 application topically as needed for dry skin. Felipa Furnace, DPM Taking Active Self  atorvastatin (LIPITOR) 40 MG tablet [id number] Yes TAKE 1  TABLET EVERY DAY [name], MD Taking Active Self  Blood Glucose Monitoring Suppl (TRUE METRIX METER) w/Device KIT [id number]  USE AS DIRECTED [name], MD  Active Self  busPIRone (BUSPAR) 10 MG tablet [id number] Yes TAKE 1 TABLET THREE TIMES DAILY [name], MD Taking Active Self  citalopram (CELEXA) 40 MG tablet [id number] Yes TAKE 1 TABLET EVERY DAY [name], MD Taking Active Self  diclofenac Sodium (VOLTAREN) 1 % GEL [id number] Yes Apply 2 g topically 4 (four) times daily. Mordecai Rasmussen, MD Taking Active Self  docusate sodium (COLACE) 100 MG capsule [id number] Yes Take 2 capsules (200 mg total) by mouth 2 (two) times daily. Thurnell Lose, MD Taking Active   FEROSUL 325 [phone]) MG tablet [id number] Yes  TAKE 1 TABLET EVERY DAY [name], MD Taking Active   fexofenadine (ALLEGRA) 180 MG tablet [id number] No Take 180 mg by mouth daily.  Patient not taking: No sig reported   [provider] Not Taking Active   gabapentin (NEURONTIN) 100 MG capsule [id number] Yes Take 1 capsule in the morning and 2 capsules in the evening. Thurnell Lose, MD Taking Active   Krill Oil 350 MG CAPS [id number] Yes Take 1 capsule by mouth in the morning and at bedtime.  [provider] Taking Active Self           Med Note Beryle Lathe   Tue Dec 28, 2020 10:44 AM) 1200 mg  lisinopril (ZESTRIL) 20 MG tablet [id number] Yes Take 1 tablet (20 mg total) by mouth daily. Erma Heritage, PA-C Taking Active Self  loratadine (CLARITIN) 10 MG tablet [id number] No Take 10 mg by mouth daily as needed for allergies.  Patient not taking: No sig reported   [provider] Not Taking Active   metFORMIN (GLUCOPHAGE) 850 MG tablet [id number] Yes Take 1 tablet (850 mg total) by mouth 2 (two) times daily with a meal. [name], MD Taking Active Self  metolazone (ZAROXOLYN) 5 MG tablet [id number] Yes PRN  Patient taking differently: Take 5 mg by mouth once a week. PRN    Thompson Grayer, MD Taking Active   metoprolol tartrate (LOPRESSOR) 100 MG tablet [id number] Yes TAKE 1 TABLET TWICE DAILY Josue Hector, MD Taking Active   pantoprazole (PROTONIX) 40 MG tablet [id number] Yes Take 1 tablet (40 mg total) by mouth daily. [name], MD Taking Active Self  polyethylene glycol (MIRALAX) 17 g packet [id number] Yes Take 17 g by mouth daily as needed. Thurnell Lose, MD Taking Active   potassium chloride SA (KLOR-CON) 20 MEQ tablet [id number] Yes TAKE 3 TABS DAILY AND TAKE AN ADDITIONAL 1 TAB ON THE DAY YOU TAKE YOUR WEEKLY METOLAZONE DOSE Ahmed Prima, Fransisco Hertz, PA-C Taking Active   rivaroxaban (XARELTO) 20 MG TABS tablet [id number] Yes TAKE 1 Tablet BY MOUTH ONCE EVERY DAY WITH SUPPER Josue Hector, MD Taking Active Self  Semaglutide (RYBELSUS) 14 MG TABS [id number] Yes Take 14 mg by mouth in the morning. [provider] Taking Active Self           Med Note Beryle Lathe   Tue Dec 28, 2020 10:49 AM) Marguerite Olea from [facility] patient assistance program  terbinafine (LAMISIL) 250 MG tablet [id number] Yes Take 1 tablet by mouth once daily [name], MD Taking Active   torsemide (DEMADEX) 20 MG tablet [id number] Yes Take 40 mg in the AM and Take 20 mg in the PM  Patient taking differently: Take 40 mg by mouth 2 (two) times daily.   Erma Heritage, [place] Taking Active Self  TRUE METRIX BLOOD GLUCOSE TEST test strip [id number]  TEST ONE TIME DAILY FOR DIABETES [name], MD  Active Self  TRUEplus Lancets 33G MISC [id number]  USE TO TEST ONE TIME DAILY FOR DIABETES [name], MD  Active Self            Patient Active Problem List   Diagnosis Date Noted   Anemia    Rectal bleeding    Heme positive stool    Anticoagulated    Chronic blood loss anemia 11/12/2020   Gastrointestinal hemorrhage 10/08/2020   S/P placement of cardiac pacemaker 02/10/2020   Encounter for examination following treatment at hospital 02/10/2020  Anxiety  02/10/2020   DM2 (diabetes mellitus, type 2) ([place]) 02/10/2020   OSA on CPAP    Abnormal liver function 03/16/2019   History of pulmonary embolism 03/16/2019   Paroxysmal atrial fibrillation ([place]) 09/05/2018   COPD (chronic obstructive pulmonary disease) ([place]) 05/07/2018   Gastroesophageal reflux disease    Pulmonary embolism ([place]) 09/18/2017   Encounter for therapeutic drug monitoring 09/04/2017   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 08/23/2017   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   Pulmonary hypertension (HCC)    Chronic periodontitis 07/18/2017   Morbid obesity ([place])    Essential hypertension    Tobacco abuse    Chronic heart failure with preserved ejection fraction (HFpEF) ([place])    Chronic venous insufficiency     Immunization History  Administered Date(s) Administered   Influenza Inj Mdck Quad Pf 02/27/2019   Influenza,inj,Quad PF,6+ Mos 02/10/2020   Janssen (J&J) SARS-COV-2 Vaccination 09/16/2019   PFIZER(Purple Top)SARS-COV-2 Vaccination 03/15/2020   Pneumococcal Polysaccharide-23 03/17/2019    Conditions to be addressed/monitored: Atrial Fibrillation, CHF, HTN, COPD, and DMII  Care Plan : Medication Management  Updates made by Beryle Lathe, House since 01/12/2021 12:00 AM     Problem: COPD, HFpEF, HTN, T2DM, Afib,   Priority: High  Onset Date: 12/28/2020     Goal: Disease Progression Prevention   Start Date: 12/28/2020  Expected End Date: 03/28/2021  Recent Progress: On track  Priority: High  Note:   Current Barriers:  Unable to independently monitor therapeutic efficacy  Pharmacist Clinical Goal(s):  Over the next 90 days, patient will achieve adherence to monitoring guidelines and medication adherence to achieve therapeutic efficacy through collaboration with PharmD and provider.   Interventions: 1:1 collaboration with [name], MD regarding development and update of  comprehensive plan of care as evidenced by provider attestation and co-signature Inter-disciplinary care team collaboration (see longitudinal plan of care) Comprehensive medication review performed; medication list updated in electronic medical record  Diabetes: Current medications: metformin 850 mg by mouth twice daily and semaglutide (Rybelsus) 14 mg by mouth at least 30 minutes before the first food, beverage, or other oral medications of the day with no more than 4 ounces of plain water only Intolerances: none Taking medications as directed: yes Side effects thought to be attributed to current medication regimen: GI upset with metformin but ok with continuing for now given benefits Previously reported hypoglycemic symptoms (dizziness/blurred vision/sweaty) with BG in the 70-90s; denies BG <70 recently Current meal patterns: breakfast: eggs, oatmeal, chicken sandwhich, and baked beans ; lunch:  ribeye and [employer] Breast in air fryer ; dinner:  leftovers from lunch ; snacks: none; drinks: water, regular soda, milk, and sports drinks Has noticed decreased appetite since starting Rybelsus Current exercise: none Controlled; Most recent A1c at goal of <7% per ADA guidelines Current glucose readings: fasting blood glucose: within goal range of 80-130 mg/dL per ADA guidelines, post prandial glucose: at goal of <180 mg/dL per ADA guidelines Medication: Identify diabetes medication and when best taken Monitoring: target blood sugar range, when to test Hypoglycemia: I have discussed with the patient how to treat hypoglycemia by the rule of 15; eat/drink 15g of sugar in the form of glucose tabs, 4 ounces of juice or soda and recheck fingerstick glucose in 15 minutes. Chocolate bars and ice cream should be avoided because fat delays carbohydrate digestion and absorption. Retreat if glucose remains low. Driving should cease until glucose is normal. Continue metformin  850 mg by mouth twice daily and  semaglutide (Rybelsus) 14 mg by mouth at least 30 minutes before the first food, beverage, or other oral medications of the day with no more than 4 ounces of plain water only Patient identified as a good candidate for SGLT2i once serum creatinine improves to baseline given heart failure with preserved ejection fraction. If able to add SGLT2i, could potentially consider discontinuation of metformin given some GI upset.   Instructed to monitor blood sugars once a day at the following times: fasting (at least 8 hours since last food consumption) and whenever patient experiences symptoms of hypo/hyperglycemia Encouraged regular aerobic exercise with a goal of 30 minutes five times per week (150 minutes per week) Discussed management of hypoglycemia. If blood sugar <70 at any time, treat with simple sugar such as 1/2 cup juice or regular soda or 3-4 glucose tablets. Recheck blood sugar in 15 minutes and repeat if blood sugar remains <70.   Hypertension: Current medications: lisinopril 20 mg by mouth once daily Intolerances: none Taking medications as directed: yes Side effects thought to be attributed to current medication regimen: no Reports some orthostatic hypotension but knows to get up slowly Current exercise: none Home blood pressure readings: not checking currently Blood pressure under fair control. Factors affecting control of BP include high salt intake, elevated BMI, lack of exercise, sleep apnea, and volume overload. Blood pressure is at goal of <130/80 mmHg per 2017 AHA/ACC guidelines. Continue lisinopril 20 mg by mouth once daily Check BMP at next PCP visit Encourage dietary sodium restriction/DASH diet Recommend regular aerobic exercise Recommend home blood pressure monitoring, to bring results in next visit Discussed need for and importance of continued work on weight loss Discussed need for medication compliance Reviewed risks of hypertension, principles of treatment and consequences  of untreated hypertension  Heart failure with preserved ejection fraction (LVEF >50% with evidence of spontaneous or provokable increased LV filling pressures): Suboptimally managed Has pacemaker Current treatment: lisinopril 20 mg by mouth once daily, metoprolol tartrate 100 mg by mouth twice daily, furosemide 40 mg twice daily, and metolazone 56m mg by mouth weekly as needed Stage C (Symptomatic heart failure)/NYHA Class III (Marked limitation of physical activity. Comfortable at rest. Less than ordinary activity causes fatigue, palpitation, or dyspnea) Most recent echocardiogram is unknown Current home vitals: not checking Current home weights: patient not sure Reports shortness of breath with activity or when lying down, fatigue and weakness, swelling in the legs, ankles and feet, and reduced ability to exercise. Denies persistent cough or wheezing, nausea and lack of appetite, and chest pain Continue lisinopril 20 mg by mouth once daily, metoprolol tartrate 100 mg by mouth twice daily, torsemide 40 mg twice daily, and metolazone 5 mg by mouth weekly as needed  Consider transition from metoprolol tartrate to metoprolol succinate to decrease pill burden per patient request. Will discuss with cardiology provider Consider addition of SGLT2i (Jardiance or FIran per 2022 AHA Heart Failure Guidelines to lower risk of cardiovascular death or hospitalization for heart failure. Will discuss with cardiology provider Check BMP at next visit with cardiology Encourage dietary sodium restriction (<3 g/day) Educated on the importance of weighing daily. Patient aware to contact cardiology/primary care team if weight gain >3 lbs in 1 day or >5 lbs in 1 week Recommend home blood pressure monitoring, to bring results in next visit Discussed need for and importance of continued work on weight loss Discussed need for medication compliance Recommend restricted fluid intake (eg, 2 L/day)  Chronic Obstructive  Pulmonary Disease: Controlled per patient Stopped smoking 08/23/2017; smoked for 30 years (1 PPD) Current treatment: albuterol metered dose (ProAir, Ventolin, Proventil) 1 puff by mouth as needed for shortness of breath; only using ~2 times per week GOLD Classification: unknown CAT score (12/28/20): 23 Most recent Pulmonary Function Testing: N/A 0 exacerbations requiring treatment in the last 6 months  Current oxygen requirements: none Given elevated CAT score >10, would consider adding a LAMA such as Spiriva Respimat. Patient will qualify for assistance through [facility].   Atrial Fibrillation: Controlled Current rate control: metoprolol tartrate 100 mg by mouth twice daily Most recent ECG: normal sinus Anticoagulation: rivaroxaban (Xarelto) 20 mg by mouth once daily Reports bleeding event during last hospitalization but none recently  CHADS2VASc score: 3 (CHF, hypertension, and diabetes) Prior ablation: '[]'  Yes  '[x]'  No Home blood pressure: not checking Home heart rate: not checking Continue metoprolol  Continue rivaroxaban (Xarelto) 20 mg by mouth once daily Encouraged regular physical activity for general health benefits Recommend home blood pressure and heart rate monitoring, to bring results in next visit Discussed need for medication compliance  Patient Goals/Self-Care Activities Over the next 90 days, patient will:  take medications as prescribed check glucose at least once daily, document, and provide at future appointments check blood pressure at least once daily, document, and provide at future appointments weigh daily, and contact provider if weight gain of 3 lbs in a day or more than 5 lbs in a week  Follow Up Plan: Face to Face appointment with care management team member scheduled for:  01/18/21      Medication Assistance: Application for Rybelsus medication assistance program in process.   Patient's preferred pharmacy is:  [facility] [address] [place] [address] Phone: [phone] Fax: [facility] - [place], [place] [address] Phone: [phone] Fax: [phone]  Follow Up:  Patient agrees to Care Plan and Follow-up.  Plan: Telephone follow up appointment with care management team member scheduled for:  01/14/21  Kennon Holter, PharmD Clinical Pharmacist [facility] [phone]

## 2021-01-12 NOTE — Patient Instructions (Signed)
Reginald [name],  It was great to talk to you today! I was able to make sure the paperwork for your Rybelsus was completed and faxed to the [employer] Patient Assistance Program. I also called them and asked for expedited shipping; however, per the program rules, they were not able to accommodate. They said you should receive your medicine within 12-14 days hopefully. I am not too worried about your blood sugars spiking up too much during the few days you are without the Rybelsus, but please contact our office if your blood sugar is consistently over 180.   Please call me with any questions or concerns.   Visit Information  PATIENT GOALS:  Goals Addressed             This Visit's Progress    Medication Management       Patient Goals/Self-Care Activities Over the next 90 days, patient will:  take medications as prescribed check glucose at least once daily, document, and provide at future appointments check blood pressure at least once daily, document, and provide at future appointments weigh daily, and contact provider if weight gain of 3 lbs in a day or more than 5 lbs in a week         Patient verbalizes understanding of instructions provided today and agrees to view in [place].   Telephone follow up appointment with care management team member scheduled for:01/14/21  Kennon Holter, PharmD Clinical Pharmacist [facility] Primary [facility] [phone]

## 2021-01-14 ENCOUNTER — Telehealth: Payer: Medicare HMO | Admitting: *Deleted

## 2021-01-14 DIAGNOSIS — I5032 Chronic diastolic (congestive) heart failure: Secondary | ICD-10-CM | POA: Diagnosis not present

## 2021-01-14 DIAGNOSIS — I480 Paroxysmal atrial fibrillation: Secondary | ICD-10-CM | POA: Diagnosis not present

## 2021-01-14 DIAGNOSIS — I872 Venous insufficiency (chronic) (peripheral): Secondary | ICD-10-CM | POA: Diagnosis not present

## 2021-01-14 DIAGNOSIS — J439 Emphysema, unspecified: Secondary | ICD-10-CM | POA: Diagnosis not present

## 2021-01-14 DIAGNOSIS — K625 Hemorrhage of anus and rectum: Secondary | ICD-10-CM | POA: Diagnosis not present

## 2021-01-14 DIAGNOSIS — I272 Pulmonary hypertension, unspecified: Secondary | ICD-10-CM | POA: Diagnosis not present

## 2021-01-14 DIAGNOSIS — I110 Hypertensive heart disease with heart failure: Secondary | ICD-10-CM | POA: Diagnosis not present

## 2021-01-14 DIAGNOSIS — I2510 Atherosclerotic heart disease of native coronary artery without angina pectoris: Secondary | ICD-10-CM | POA: Diagnosis not present

## 2021-01-14 DIAGNOSIS — E1151 Type 2 diabetes mellitus with diabetic peripheral angiopathy without gangrene: Secondary | ICD-10-CM | POA: Diagnosis not present

## 2021-01-17 ENCOUNTER — Telehealth: Payer: Self-pay | Admitting: *Deleted

## 2021-01-17 NOTE — Telephone Encounter (Signed)
Transition Care Management Unsuccessful Follow-up Telephone Call  Date of discharge and from where:  [facility]  Attempts:  1st Attempt  Reason for unsuccessful TCM follow-up call:  Left voice message

## 2021-01-18 ENCOUNTER — Telehealth: Payer: Self-pay

## 2021-01-18 ENCOUNTER — Other Ambulatory Visit: Payer: Self-pay

## 2021-01-18 ENCOUNTER — Ambulatory Visit: Payer: Medicare HMO | Admitting: Pharmacist

## 2021-01-18 DIAGNOSIS — E1169 Type 2 diabetes mellitus with other specified complication: Secondary | ICD-10-CM

## 2021-01-18 DIAGNOSIS — I10 Essential (primary) hypertension: Secondary | ICD-10-CM

## 2021-01-18 DIAGNOSIS — J449 Chronic obstructive pulmonary disease, unspecified: Secondary | ICD-10-CM

## 2021-01-18 DIAGNOSIS — F419 Anxiety disorder, unspecified: Secondary | ICD-10-CM

## 2021-01-18 DIAGNOSIS — F339 Major depressive disorder, recurrent, unspecified: Secondary | ICD-10-CM

## 2021-01-18 DIAGNOSIS — I5032 Chronic diastolic (congestive) heart failure: Secondary | ICD-10-CM

## 2021-01-18 DIAGNOSIS — I480 Paroxysmal atrial fibrillation: Secondary | ICD-10-CM

## 2021-01-18 MED ORDER — BUPROPION HCL ER (XL) 150 MG PO TB24: 150.0000 mg | ORAL_TABLET | Freq: Every day | ORAL | 0 refills | Status: DC

## 2021-01-18 MED ORDER — BUPROPION HCL ER (XL) 300 MG PO TB24: 300.0000 mg | ORAL_TABLET | Freq: Every day | ORAL | 3 refills | Status: DC

## 2021-01-18 NOTE — Telephone Encounter (Signed)
Scheduled for Dr. Posey Pronto in 2 weeks. Does not need a TOC at this time, discharged from [facility] services.

## 2021-01-18 NOTE — Telephone Encounter (Signed)
Pt was on the list for [facility] call yesterday after being discharged attempted to call pt lvm that's why he was calling. Can you please call and do the TOC with hima and set him up an appt with Posey Pronto in the next week

## 2021-01-18 NOTE — Telephone Encounter (Signed)
I spoke with pt by phone, please see new message.

## 2021-01-18 NOTE — Telephone Encounter (Signed)
I spoke with pt and he was concerned about waiting so long to have his Hgb re-drawn states he got [place] to order a Hgb and it was low again. States he is having a lot of issues with getting a call back from this office and he is not satisfied and feels as if his care is at risk due to not being acknowledged.

## 2021-01-18 NOTE — Patient Instructions (Signed)
[name],  It was great to talk to you today!   Please get your new prescription from the pharmacy (bupropion). We discontinued your citalopram and buspirone. Please call your mail order pharmacy and have them discontinue these medications.  Hopefully you will receive your Jardiance and Rybelsus soon from the drug manufacturers.  Please call me with any questions or concerns.   Visit Information  PATIENT GOALS:  Goals Addressed             This Visit's Progress    Medication Management       Patient Goals/Self-Care Activities Over the next 90 days, patient will:  take medications as prescribed check glucose at least once daily, document, and provide at future appointments check blood pressure at least once daily, document, and provide at future appointments weigh daily, and contact provider if weight gain of 3 lbs in a day or more than 5 lbs in a week          Patient verbalizes understanding of instructions provided today and agrees to view in [place].   Telephone follow up appointment with care management team member scheduled for: 02/08/21  Kennon Holter, PharmD Clinical Pharmacist [facility] Primary Care [phone]

## 2021-01-18 NOTE — Chronic Care Management (AMB) (Signed)
Chronic Care Management Pharmacy Note  01/18/2021 Name:  Reginald [name] MRN:  [id number] DOB:  [birth year]  Summary: Completed and faxed BI Cares application for Jardiance  Changes made from today's visit: Discussed anxiety/depression with PCP. Discontinued citalopram and buspirone and will initiate bupropion   Subjective: Reginald [name] is an 50 y.o. year old male who is a primary patient of Reginald Spar, MD.  The CCM team was consulted for assistance with disease management and care coordination needs.    Engaged with patient face to face for follow up visit in response to provider referral for pharmacy case management and/or care coordination services.   Consent to Services:  The patient was given information about Chronic Care Management services, agreed to services, and gave verbal consent prior to initiation of services.  Please see initial visit note for detailed documentation.   Patient Care Team: Reginald Spar, MD as PCP - General (Internal Medicine) Reginald Grayer, MD as PCP - Electrophysiology (Cardiology) Reginald Hector, MD as PCP - Cardiology (Cardiology) Reginald Mends, RN as Reginald [name], Reginald Pila, LCSW as Social Worker (Licensed Clinical Social Worker) Reginald [name], [facility] (Pharmacist)  Objective:  Lab Results  Component Value Date   CREATININE 1.18 01/05/2021   CREATININE 2.28 (H) 12/20/2020   CREATININE 1.57 (H) 11/24/2020    Lab Results  Component Value Date   HGBA1C 5.4 11/12/2020   Last diabetic Eye exam: No results found for: HMDIABEYEEXA  Last diabetic Foot exam: No results found for: HMDIABFOOTEX      Component Value Date/Time   CHOL 123 06/10/2020 0837   TRIG 281 (H) 06/10/2020 0837   HDL 25 (L) 06/10/2020 0837   CHOLHDL 4.9 06/10/2020 0837   CHOLHDL 6.6 01/01/2020 1014   VLDL 73 (H) 01/01/2020 1014   [place] 54 06/10/2020 0837    Hepatic Function Latest Ref Rng & Units 11/24/2020 11/15/2020  11/14/2020  Total Protein 6.0 - 8.5 g/dL 7.9 8.0 7.8  Albumin 4.0 - 5.0 g/dL 4.3 3.6 3.6  AST 0 - 40 IU/L 41(H) 32 30  ALT 0 - 44 IU/L '27 23 23  ' Alk Phosphatase 44 - 121 IU/L 98 76 72  Total Bilirubin 0.0 - 1.2 mg/dL 0.7 1.4(H) 1.2  Bilirubin, Direct 0.0 - 0.2 mg/dL - - -    Lab Results  Component Value Date/Time   TSH 4.060 10/04/2020 09:43 AM   TSH 5.240 (H) 06/10/2020 08:37 AM   FREET4 1.30 10/04/2020 09:43 AM   FREET4 0.79 (L) 05/03/2018 02:04 PM    CBC Latest Ref Rng & Units 01/05/2021 11/24/2020 11/15/2020  WBC 4.0 - 10.5 K/uL 10.7(H) 8.0 10.9(H)  Hemoglobin 13.0 - 17.0 g/dL 10.5(L) 12.0(L) 10.4(L)  Hematocrit 39.0 - 52.0 % 35.3(L) 40.9 35.1(L)  Platelets 150 - 400 K/uL 261 320 240    No results found for: VD25OH  Clinical ASCVD: No  The ASCVD Risk score Reginald Bussing DC Jr., et al., 2013) failed to calculate for the following reasons:   The valid total cholesterol range is 130 to 320 mg/dL    Social History   Tobacco Use  Smoking Status Former   Packs/day: 2.00   Years: 16.00   Pack years: 32.00   Types: Cigarettes   Quit date: 08/23/2017   Years since quitting: 3.4  Smokeless Tobacco Never   BP Readings from Last 3 Encounters:  01/12/21 128/70  11/26/20 138/74  11/26/20 135/75   Pulse Readings from Last 3  Encounters:  01/12/21 69  11/26/20 68  11/26/20 71   Wt Readings from Last 3 Encounters:  01/12/21 (!) 471 lb (213.6 kg)  11/26/20 (!) 463 lb 12.8 oz (210.4 kg)  11/26/20 (!) 470 lb (213.2 kg)    Assessment: Review of patient past medical history, allergies, medications, health status, including review of consultants reports, laboratory and other test data, was performed as part of comprehensive evaluation and provision of chronic care management services.   SDOH:  (Social Determinants of Health) assessments and interventions performed:    CCM Care Plan  No Known Allergies  Medications Reviewed Today     Reviewed by Reginald [name], [facility]  (Pharmacist) on 01/18/21 at 83  Med List Status: <None>   Medication Order Taking? Sig Documenting Provider Last Dose Status Informant  acetaminophen (TYLENOL) 325 MG tablet [id number] Yes Take 650 mg by mouth every 6 (six) hours as needed for mild pain. [provider] Taking Active Self  albuterol (PROVENTIL) (2.5 MG/3ML) 0.083% nebulizer solution [id number] Yes INHALE 1 VIAL VIA NEBULIZER EVERY 6 HOURS AS NEEDED FOR WHEEZING OR SHORTNESS OF Reginald Chu, MD Taking Active Self           Med Note Reginald [name]   Tue Dec 28, 2020 10:40 AM) Only uses every few weeks when he mows yard  albuterol (VENTOLIN HFA) 108 (90 Base) MCG/ACT inhaler [id number] Yes INHALE 2 PUFFS INTO THE LUNGS EVERY 6 (SIX) HOURS AS NEEDED FOR WHEEZING OR SHORTNESS OF BREATH. Reginald Spar, MD Taking Active            Med Note Reginald [name]   Tue Dec 28, 2020 10:41 AM) Uses a few times per week  allopurinol (ZYLOPRIM) 300 MG tablet [id number] Yes TAKE 1 TABLET EVERY DAY Reginald Spar, MD Taking Active Self  ammonium lactate (AMLACTIN) 12 % lotion [id number] Yes Apply 1 application topically as needed for dry skin. Reginald [name], DPM Taking Active Self  atorvastatin (LIPITOR) 40 MG tablet [id number] Yes TAKE 1 TABLET EVERY DAY Reginald Spar, MD Taking Active Self  Blood Glucose Monitoring Suppl (TRUE METRIX METER) w/Device KIT [id number]  USE AS DIRECTED Reginald Spar, MD  Active Self  busPIRone (BUSPAR) 10 MG tablet [id number] Yes TAKE 1 TABLET THREE TIMES DAILY Reginald Spar, MD Taking Active Self           Med Note Reginald [name]   Tue Jan 18, 2021 10:47 AM) Patient reports only taking twice daily   citalopram (CELEXA) 40 MG tablet [id number] Yes TAKE 1 TABLET EVERY DAY Reginald Spar, MD Taking Active Self  diclofenac Sodium (VOLTAREN) 1 % GEL [id number] Yes Apply 2 g topically 4 (four) times daily. Reginald Rasmussen, MD Taking Active Self  docusate sodium (COLACE) 100 MG  capsule [id number] Yes Take 2 capsules (200 mg total) by mouth 2 (two) times daily. Reginald Lose, MD Taking Active   empagliflozin (JARDIANCE) 10 MG TABS tablet [id number] Yes Take 1 tablet (10 mg total) by mouth daily before breakfast. Reginald [name] Taking Active   FEROSUL 325 (65 Fe) MG tablet [id number] Yes TAKE 1 TABLET EVERY DAY Reginald Spar, MD Taking Active   fexofenadine (ALLEGRA) 180 MG tablet [id number] Yes Take 180 mg by mouth daily. [provider] Taking Active   gabapentin (NEURONTIN) 100 MG capsule [id number] Yes Take 1 capsule in the morning and 2 capsules in the evening. Reginald Lose, MD Taking  Active   Krill Oil 350 MG CAPS [id number] Yes Take 1 capsule by mouth in the morning and at bedtime.  [provider] Taking Active Self           Med Note Reginald [name]   Tue Dec 28, 2020 10:44 AM) 1200 mg  lisinopril (ZESTRIL) 20 MG tablet [id number] Yes Take 1 tablet (20 mg total) by mouth daily. Erma Heritage, PA-C Taking Active Self  loratadine (CLARITIN) 10 MG tablet [id number] No Take 10 mg by mouth daily as needed for allergies.  Patient not taking: No sig reported   [provider] Not Taking Active   metFORMIN (GLUCOPHAGE) 850 MG tablet [id number] Yes Take 1 tablet (850 mg total) by mouth 2 (two) times daily with a meal. Reginald Spar, MD Taking Active Self  metolazone (ZAROXOLYN) 5 MG tablet [id number] Yes PRN  Patient taking differently: Take 5 mg by mouth once a week. PRN   Reginald Grayer, MD Taking Active   metoprolol tartrate (LOPRESSOR) 100 MG tablet [id number] Yes TAKE 1 TABLET TWICE DAILY Reginald Hector, MD Taking Active   pantoprazole (PROTONIX) 40 MG tablet [id number] Yes Take 1 tablet (40 mg total) by mouth daily. Reginald Spar, MD Taking Active Self  polyethylene glycol (MIRALAX) 17 g packet [id number] Yes Take 17 g by mouth daily as needed. Reginald Lose, MD Taking Active   potassium chloride SA (KLOR-CON)  20 MEQ tablet [id number] Yes TAKE 3 TABS DAILY AND TAKE AN ADDITIONAL 1 TAB ON THE DAY YOU TAKE YOUR WEEKLY METOLAZONE DOSE Ahmed Prima, Fransisco Hertz, PA-C Taking Active   rivaroxaban (XARELTO) 20 MG TABS tablet [id number] Yes TAKE 1 Tablet BY MOUTH ONCE EVERY DAY WITH SUPPER Reginald Hector, MD Taking Active Self  Semaglutide (RYBELSUS) 14 MG TABS [id number] No Take 14 mg by mouth in the morning.  Patient not taking: Reported on 01/18/2021   [provider] Not Taking Active Self           Med Note Reginald [name]   Tue Dec 28, 2020 10:49 AM) Marguerite Olea from [facility] patient assistance program  terbinafine (LAMISIL) 250 MG tablet [id number] Yes Take 1 tablet by mouth once daily Reginald Spar, MD Taking Active   torsemide (DEMADEX) 20 MG tablet [id number] Yes Take 40 mg in the AM and Take 20 mg in the PM  Patient taking differently: Take 40 mg by mouth 2 (two) times daily.   Erma Heritage, [place] Taking Active Self  TRUE METRIX BLOOD GLUCOSE TEST test strip [id number]  TEST ONE TIME DAILY FOR DIABETES Reginald Spar, MD  Active Self  TRUEplus Lancets 33G MISC [id number]  USE TO TEST ONE TIME DAILY FOR DIABETES Reginald Spar, MD  Active Self            Patient Active Problem List   Diagnosis Date Noted   Anemia    Rectal bleeding    Heme positive stool    Anticoagulated    Chronic blood loss anemia 11/12/2020   Gastrointestinal hemorrhage 10/08/2020   S/P placement of cardiac pacemaker 02/10/2020   Encounter for examination following treatment at hospital 02/10/2020   Anxiety 02/10/2020   DM2 (diabetes mellitus, type 2) ([place]) 02/10/2020   OSA on CPAP    Abnormal liver function 03/16/2019   History of pulmonary embolism 03/16/2019   Paroxysmal atrial fibrillation ([place]) 09/05/2018   COPD (chronic obstructive pulmonary disease) ([place]) 05/07/2018   Gastroesophageal reflux disease    Pulmonary  embolism ([place]) 09/18/2017   Encounter for therapeutic drug monitoring 09/04/2017    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 08/23/2017   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   Pulmonary hypertension ([place])    Chronic periodontitis 07/18/2017   Morbid obesity ([place])    Essential hypertension    Tobacco abuse    Chronic heart failure with preserved ejection fraction (HFpEF) ([place])    Chronic venous insufficiency     Immunization History  Administered Date(s) Administered   Influenza Inj Mdck Quad Pf 02/27/2019   Influenza,inj,Quad PF,6+ Mos 02/10/2020   Janssen (J&J) SARS-COV-2 Vaccination 09/16/2019   PFIZER(Purple Top)SARS-COV-2 Vaccination 03/15/2020   Pneumococcal Polysaccharide-23 03/17/2019    Conditions to be addressed/monitored: Atrial Fibrillation, CHF, HTN, COPD, DMII, Anxiety, and Depression  Care Plan : Medication Management  Updates made by Reginald [name], [place] since 01/18/2021 12:00 AM     Problem: COPD, HFpEF, HTN, T2DM, Afib, anxiety/depression   Priority: High  Onset Date: 12/28/2020     Goal: Disease Progression Prevention   Start Date: 12/28/2020  Expected End Date: 03/28/2021  This Visit's Progress: On track  Recent Progress: On track  Priority: High  Note:   Current Barriers:  Unable to independently monitor therapeutic efficacy  Pharmacist Clinical Goal(s):  Over the next 90 days, patient will achieve adherence to monitoring guidelines and medication adherence to achieve therapeutic efficacy through collaboration with PharmD and provider.   Interventions: 1:1 collaboration with Reginald Spar, MD regarding development and update of comprehensive plan of care as evidenced by provider attestation and co-signature Inter-disciplinary care team collaboration (see longitudinal plan of care) Comprehensive medication review performed; medication list updated in electronic medical record  Diabetes: Current medications: metformin 850 mg by mouth twice daily and Jardiance 10  mg by mouth daily  Patient reports recently running out of semaglutide (Rybelsus) 14 mg. Refill in process with [employer] confirmed via telephone this morning. Patient should receive within 10-14 days. Intolerances: none Taking medications as directed: yes Side effects thought to be attributed to current medication regimen: GI upset with metformin but ok with continuing for now given benefits Previously reported hypoglycemic symptoms (dizziness/blurred vision/sweaty) with BG in the 70-90s; denies BG <70 recently Current meal patterns: breakfast: eggs, oatmeal, chicken sandwhich, and baked beans ; lunch:  ribeye and [employer] in air fryer ; dinner:  leftovers from lunch ; snacks: none; drinks: water, regular soda, milk, and sports drinks Has noticed decreased appetite since starting Rybelsus Current exercise: none Controlled; Most recent A1c at goal of <7% per ADA guidelines Current glucose readings: fasting blood glucose: within goal range of 80-130 mg/dL per ADA guidelines, post prandial glucose: at goal of <180 mg/dL per ADA guidelines Medication: Identify diabetes medication and when best taken Monitoring: target blood sugar range, when to test Hypoglycemia: I have discussed with the patient how to treat hypoglycemia by the rule of 15; eat/drink 15g of sugar in the form of glucose tabs, 4 ounces of juice or soda and recheck fingerstick glucose in 15 minutes. Chocolate bars and ice cream should be avoided because fat delays carbohydrate digestion and absorption. Retreat if glucose remains low. Driving should cease until glucose is normal. Continue metformin 850 mg by mouth twice daily, Jardiance 10 mg by mouth daily and semaglutide (Rybelsus) 14 mg by mouth at least 30 minutes before the first food, beverage, or other oral medications of the day with no more than 4 ounces of plain water  only BI Cares application completed with patient today. Will fax and follow-up on approval in a few  weeks Instructed to monitor blood sugars once a day at the following times: fasting (at least 8 hours since last food consumption) and whenever patient experiences symptoms of hypo/hyperglycemia Encouraged regular aerobic exercise with a goal of 30 minutes five times per week (150 minutes per week) Discussed management of hypoglycemia. If blood sugar <70 at any time, treat with simple sugar such as 1/2 cup juice or regular soda or 3-4 glucose tablets. Recheck blood sugar in 15 minutes and repeat if blood sugar remains <70.   Hypertension: Current medications: lisinopril 20 mg by mouth once daily Intolerances: none Taking medications as directed: yes Side effects thought to be attributed to current medication regimen: no Reports some orthostatic hypotension but knows to get up slowly Current exercise: none Home blood pressure readings: not checking currently Blood pressure under fair control. Factors affecting control of BP include high salt intake, elevated BMI, lack of exercise, sleep apnea, and volume overload. Blood pressure is at goal of <130/80 mmHg per 2017 AHA/ACC guidelines. Continue lisinopril 20 mg by mouth once daily Check BMP at next PCP visit Encourage dietary sodium restriction/DASH diet Recommend regular aerobic exercise Recommend home blood pressure monitoring, to bring results in next visit Discussed need for and importance of continued work on weight loss Discussed need for medication compliance Reviewed risks of hypertension, principles of treatment and consequences of untreated hypertension  Heart failure with preserved ejection fraction (LVEF >50% with evidence of spontaneous or provokable increased LV filling pressures): Appropriately managed; recently added SGLT2 inhibitor since serum creatinine improved  Has pacemaker Current treatment: lisinopril 20 mg by mouth once daily, metoprolol tartrate 100 mg by mouth twice daily, empagliflozin (Jardiance) 10 mg by mouth once  daily, furosemide 40 mg twice daily, and metolazone 18m mg by mouth weekly as needed Stage C (Symptomatic heart failure)/NYHA Class III (Marked limitation of physical activity. Comfortable at rest. Less than ordinary activity causes fatigue, palpitation, or dyspnea) Most recent echocardiogram is unknown Current home vitals: not checking Current home weights: patient not sure Reports shortness of breath with activity or when lying down, fatigue and weakness, swelling in the legs, ankles and feet, and reduced ability to exercise. Denies persistent cough or wheezing, nausea and lack of appetite, and chest pain Continue lisinopril 20 mg by mouth once daily, metoprolol tartrate 100 mg by mouth twice daily, torsemide 40 mg twice daily, and metolazone 5 mg by mouth weekly as needed  Consider transition from metoprolol tartrate to metoprolol succinate to decrease pill burden per patient request. Will discuss with cardiology provider Check BMP at next visit with cardiology Encourage dietary sodium restriction (<3 g/day) Educated on the importance of weighing daily. Patient aware to contact cardiology/primary care team if weight gain >3 lbs in 1 day or >5 lbs in 1 week Recommend home blood pressure monitoring, to bring results in next visit Discussed need for and importance of continued work on weight loss Discussed need for medication compliance Recommend restricted fluid intake (eg, 2 L/day)  Chronic Obstructive Pulmonary Disease: Controlled per patient - only uses albuterol as needed  Patient has been without CPAP machine for a while but will get a new machine early September and plans to use Stopped smoking 08/23/2017; smoked for 30 years (1 PPD) Current treatment: albuterol metered dose (ProAir, Ventolin, Proventil) 1 puff by mouth as needed for shortness of breath; only using ~2 times per week GOLD Classification:  unknown CAT score (12/28/20): 23 Most recent Pulmonary Function Testing: N/A 0  exacerbations requiring treatment in the last 6 months  Current oxygen requirements: none Given elevated CAT score >10, would consider adding a LAMA such as Spiriva Respimat. Patient will qualify for assistance through [facility].   Atrial Fibrillation: Controlled Current rate control: metoprolol tartrate 100 mg by mouth twice daily Most recent ECG: normal sinus Anticoagulation: rivaroxaban (Xarelto) 20 mg by mouth once daily Reports bleeding event during last hospitalization but none recently  CHADS2VASc score: 3 (CHF, hypertension, and diabetes) Prior ablation: '[]'  Yes  '[x]'  No Home blood pressure: not checking Home heart rate: not checking Continue metoprolol  Continue rivaroxaban (Xarelto) 20 mg by mouth once daily Encouraged regular physical activity for general health benefits Recommend home blood pressure and heart rate monitoring, to bring results in next visit Discussed need for medication compliance  Anxiety/Depression: Uncontrolled per patient  Current medication: citalopram 40 mg by mouth daily and buspirone three time daily  Patient reports not being able to take middle of the day dose of buspirone and dose not feel like he is optimally managed.  Discussed with PCP and will discontinue citalopram and buspirone and initiate bupropion. Will plan to titrate after 4 weeks on lower dose.   Patient Goals/Self-Care Activities Over the next 90 days, patient will:  take medications as prescribed check glucose at least once daily, document, and provide at future appointments check blood pressure at least once daily, document, and provide at future appointments weigh daily, and contact provider if weight gain of 3 lbs in a day or more than 5 lbs in a week  Follow Up Plan: Face to Face appointment with care management team member scheduled for:  02/08/21      Medication Assistance:  BI Cares for [employer] and [employer] for [employer] preferred pharmacy is:  [facility] [address] [place] [address] Phone: [phone] Fax: [facility] ([facility] - [place], [place] [address] Phone: [phone] Fax: [phone]  Follow Up:  Patient agrees to Care Plan and Follow-up.  Plan: Telephone follow up appointment with care management team member scheduled for:  02/08/21  Kennon Holter, PharmD Clinical Pharmacist [facility] [phone]

## 2021-01-19 DIAGNOSIS — I5032 Chronic diastolic (congestive) heart failure: Secondary | ICD-10-CM

## 2021-01-19 DIAGNOSIS — J449 Chronic obstructive pulmonary disease, unspecified: Secondary | ICD-10-CM | POA: Diagnosis not present

## 2021-01-19 DIAGNOSIS — F339 Major depressive disorder, recurrent, unspecified: Secondary | ICD-10-CM

## 2021-01-19 DIAGNOSIS — E1169 Type 2 diabetes mellitus with other specified complication: Secondary | ICD-10-CM | POA: Diagnosis not present

## 2021-01-19 DIAGNOSIS — I480 Paroxysmal atrial fibrillation: Secondary | ICD-10-CM | POA: Diagnosis not present

## 2021-01-19 DIAGNOSIS — I10 Essential (primary) hypertension: Secondary | ICD-10-CM

## 2021-01-20 ENCOUNTER — Ambulatory Visit (INDEPENDENT_AMBULATORY_CARE_PROVIDER_SITE_OTHER): Payer: Medicare HMO | Admitting: Pharmacist

## 2021-01-20 DIAGNOSIS — F339 Major depressive disorder, recurrent, unspecified: Secondary | ICD-10-CM

## 2021-01-20 DIAGNOSIS — J449 Chronic obstructive pulmonary disease, unspecified: Secondary | ICD-10-CM

## 2021-01-20 DIAGNOSIS — I480 Paroxysmal atrial fibrillation: Secondary | ICD-10-CM

## 2021-01-20 DIAGNOSIS — E1169 Type 2 diabetes mellitus with other specified complication: Secondary | ICD-10-CM

## 2021-01-20 DIAGNOSIS — F419 Anxiety disorder, unspecified: Secondary | ICD-10-CM

## 2021-01-20 DIAGNOSIS — I5032 Chronic diastolic (congestive) heart failure: Secondary | ICD-10-CM

## 2021-01-20 DIAGNOSIS — I10 Essential (primary) hypertension: Secondary | ICD-10-CM

## 2021-01-20 DIAGNOSIS — G4733 Obstructive sleep apnea (adult) (pediatric): Secondary | ICD-10-CM | POA: Diagnosis not present

## 2021-01-20 NOTE — Chronic Care Management (AMB) (Signed)
Chronic Care Management Pharmacy Note  01/20/2021 Name:  Reginald [name] MRN:  [id number] DOB:  07/01/1970  Summary:  Received notification from [place] that application for Jardiance that patient falls below 150% federal poverty level which means he may qualify for Medicare Extra Help. Patient has been made aware and will come in next week for me to assist with Medicare Extra Help application. If approved, will send prescription to pharmacy and if denied then will submit denial to [facility].  Patient getting CPAP on 01/20/21 which will likely improve blood pressure Plan to titrate bupropion to 300 mg daily after 4 weeks on lower dose  Subjective: Reginald [name] is an 50 y.o. year old male who is a primary patient of Reginald Spar, MD.  The CCM team was consulted for assistance with disease management and care coordination needs.    Engaged with patient by telephone for follow up visit in response to provider referral for pharmacy case management and/or care coordination services.   Consent to Services:  The patient was given information about Chronic Care Management services, agreed to services, and gave verbal consent prior to initiation of services.  Please see initial visit note for detailed documentation.   Patient Care Team: Reginald Spar, MD as PCP - General (Internal Medicine) Thompson Grayer, MD as PCP - Electrophysiology (Cardiology) Josue Hector, MD as PCP - Cardiology (Cardiology) Kassie Mends, RN as [place] Management [place], Ranae Pila, LCSW as Social Worker (Licensed Clinical Social Worker) Beryle Lathe, [facility] (Pharmacist)  Objective:  Lab Results  Component Value Date   CREATININE 1.18 01/05/2021   CREATININE 2.28 (H) 12/20/2020   CREATININE 1.57 (H) 11/24/2020    Lab Results  Component Value Date   HGBA1C 5.4 11/12/2020   Last diabetic Eye exam: No results found for: HMDIABEYEEXA  Last diabetic Foot exam: No results found for:  HMDIABFOOTEX      Component Value Date/Time   CHOL 123 06/10/2020 0837   TRIG 281 (H) 06/10/2020 0837   HDL 25 (L) 06/10/2020 0837   CHOLHDL 4.9 06/10/2020 0837   CHOLHDL 6.6 01/01/2020 1014   VLDL 73 (H) 01/01/2020 1014   [place] 54 06/10/2020 0837    Hepatic Function Latest Ref Rng & Units 11/24/2020 11/15/2020 11/14/2020  Total Protein 6.0 - 8.5 g/dL 7.9 8.0 7.8  Albumin 4.0 - 5.0 g/dL 4.3 3.6 3.6  AST 0 - 40 IU/L 41(H) 32 30  ALT 0 - 44 IU/L '27 23 23  ' Alk Phosphatase 44 - 121 IU/L 98 76 72  Total Bilirubin 0.0 - 1.2 mg/dL 0.7 1.4(H) 1.2  Bilirubin, Direct 0.0 - 0.2 mg/dL - - -    Lab Results  Component Value Date/Time   TSH 4.060 10/04/2020 09:43 AM   TSH 5.240 (H) 06/10/2020 08:37 AM   FREET4 1.30 10/04/2020 09:43 AM   FREET4 0.79 (L) 05/03/2018 02:04 PM    CBC Latest Ref Rng & Units 01/05/2021 11/24/2020 11/15/2020  WBC 4.0 - 10.5 K/uL 10.7(H) 8.0 10.9(H)  Hemoglobin 13.0 - 17.0 g/dL 10.5(L) 12.0(L) 10.4(L)  Hematocrit 39.0 - 52.0 % 35.3(L) 40.9 35.1(L)  Platelets 150 - 400 K/uL 261 320 240    No results found for: VD25OH  Clinical ASCVD: No  The ASCVD Risk score Reginald [name] DC Jr., et al., 2013) failed to calculate for the following reasons:   The valid total cholesterol range is 130 to 320 mg/dL    Social History  Tobacco Use  Smoking Status Former   Packs/day: 2.00   Years: 16.00   Pack years: 32.00   Types: Cigarettes   Quit date: 08/23/2017   Years since quitting: 3.4  Smokeless Tobacco Never   BP Readings from Last 3 Encounters:  01/12/21 128/70  11/26/20 138/74  11/26/20 135/75   Pulse Readings from Last 3 Encounters:  01/12/21 69  11/26/20 68  11/26/20 71   Wt Readings from Last 3 Encounters:  01/12/21 (!) 471 lb (213.6 kg)  11/26/20 (!) 463 lb 12.8 oz (210.4 kg)  11/26/20 (!) 470 lb (213.2 kg)    Assessment: Review of patient past medical history, allergies, medications, health status, including review of consultants reports, laboratory and  other test data, was performed as part of comprehensive evaluation and provision of chronic care management services.   SDOH:  (Social Determinants of Health) assessments and interventions performed:    CCM Care Plan  No Known Allergies  Medications Reviewed Today     Reviewed by Beryle Lathe, [facility] (Pharmacist) on 01/18/21 at 43  Med List Status: <None>   Medication Order Taking? Sig Documenting Provider Last Dose Status Informant  acetaminophen (TYLENOL) 325 MG tablet [id number] Yes Take 650 mg by mouth every 6 (six) hours as needed for mild pain. [provider] Taking Active Self  albuterol (PROVENTIL) (2.5 MG/3ML) 0.083% nebulizer solution [id number] Yes INHALE 1 VIAL VIA NEBULIZER EVERY 6 HOURS AS NEEDED FOR WHEEZING OR SHORTNESS OF Sara Chu, MD Taking Active Self           Med Note Beryle Lathe   Tue Dec 28, 2020 10:40 AM) Only uses every few weeks when he mows yard  albuterol (VENTOLIN HFA) 108 (90 Base) MCG/ACT inhaler [id number] Yes INHALE 2 PUFFS INTO THE LUNGS EVERY 6 (SIX) HOURS AS NEEDED FOR WHEEZING OR SHORTNESS OF BREATH. Reginald Spar, MD Taking Active            Med Note Beryle Lathe   Tue Dec 28, 2020 10:41 AM) Uses a few times per week  allopurinol (ZYLOPRIM) 300 MG tablet [id number] Yes TAKE 1 TABLET EVERY DAY Reginald Spar, MD Taking Active Self  ammonium lactate (AMLACTIN) 12 % lotion [id number] Yes Apply 1 application topically as needed for dry skin. Felipa Furnace, DPM Taking Active Self  atorvastatin (LIPITOR) 40 MG tablet [id number] Yes TAKE 1 TABLET EVERY DAY Reginald Spar, MD Taking Active Self  Blood Glucose Monitoring Suppl (TRUE METRIX METER) w/Device KIT [id number]  USE AS DIRECTED Reginald Spar, MD  Active Self  busPIRone (BUSPAR) 10 MG tablet [id number] Yes TAKE 1 TABLET THREE TIMES DAILY Reginald Spar, MD Taking Active Self           Med Note Beryle Lathe   Tue Jan 18, 2021 10:47 AM)  Patient reports only taking twice daily   citalopram (CELEXA) 40 MG tablet [id number] Yes TAKE 1 TABLET EVERY DAY Reginald Spar, MD Taking Active Self  diclofenac Sodium (VOLTAREN) 1 % GEL [id number] Yes Apply 2 g topically 4 (four) times daily. Mordecai Rasmussen, MD Taking Active Self  docusate sodium (COLACE) 100 MG capsule [id number] Yes Take 2 capsules (200 mg total) by mouth 2 (two) times daily. Thurnell Lose, MD Taking Active   empagliflozin (JARDIANCE) 10 MG TABS tablet [id number] Yes Take 1 tablet (10 mg total) by mouth daily before breakfast. Ahmed Prima Fransisco Hertz, PA-C Taking Active   FEROSUL 325 [phone]  Fe) MG tablet [id number] Yes TAKE 1 TABLET EVERY DAY Reginald Spar, MD Taking Active   fexofenadine (ALLEGRA) 180 MG tablet [id number] Yes Take 180 mg by mouth daily. [provider] Taking Active   gabapentin (NEURONTIN) 100 MG capsule [id number] Yes Take 1 capsule in the morning and 2 capsules in the evening. Thurnell Lose, MD Taking Active   Krill Oil 350 MG CAPS [id number] Yes Take 1 capsule by mouth in the morning and at bedtime.  [provider] Taking Active Self           Med Note Beryle Lathe   Tue Dec 28, 2020 10:44 AM) 1200 mg  lisinopril (ZESTRIL) 20 MG tablet [id number] Yes Take 1 tablet (20 mg total) by mouth daily. Erma Heritage, PA-C Taking Active Self  loratadine (CLARITIN) 10 MG tablet [id number] No Take 10 mg by mouth daily as needed for allergies.  Patient not taking: No sig reported   [provider] Not Taking Active   metFORMIN (GLUCOPHAGE) 850 MG tablet [id number] Yes Take 1 tablet (850 mg total) by mouth 2 (two) times daily with a meal. Reginald Spar, MD Taking Active Self  metolazone (ZAROXOLYN) 5 MG tablet [id number] Yes PRN  Patient taking differently: Take 5 mg by mouth once a week. PRN   Thompson Grayer, MD Taking Active   metoprolol tartrate (LOPRESSOR) 100 MG tablet [id number] Yes TAKE 1 TABLET TWICE DAILY Josue Hector,  MD Taking Active   pantoprazole (PROTONIX) 40 MG tablet [id number] Yes Take 1 tablet (40 mg total) by mouth daily. Reginald Spar, MD Taking Active Self  polyethylene glycol (MIRALAX) 17 g packet [id number] Yes Take 17 g by mouth daily as needed. Thurnell Lose, MD Taking Active   potassium chloride SA (KLOR-CON) 20 MEQ tablet [id number] Yes TAKE 3 TABS DAILY AND TAKE AN ADDITIONAL 1 TAB ON THE DAY YOU TAKE YOUR WEEKLY METOLAZONE DOSE Ahmed Prima, Fransisco Hertz, PA-C Taking Active   rivaroxaban (XARELTO) 20 MG TABS tablet [id number] Yes TAKE 1 Tablet BY MOUTH ONCE EVERY DAY WITH SUPPER Josue Hector, MD Taking Active Self  Semaglutide (RYBELSUS) 14 MG TABS [id number] No Take 14 mg by mouth in the morning.  Patient not taking: Reported on 01/18/2021   [provider] Not Taking Active Self           Med Note Beryle Lathe   Tue Dec 28, 2020 10:49 AM) Marguerite Olea from [facility] patient assistance program  terbinafine (LAMISIL) 250 MG tablet [id number] Yes Take 1 tablet by mouth once daily Reginald Spar, MD Taking Active   torsemide (DEMADEX) 20 MG tablet [id number] Yes Take 40 mg in the AM and Take 20 mg in the PM  Patient taking differently: Take 40 mg by mouth 2 (two) times daily.   Erma Heritage, [place] Taking Active Self  TRUE METRIX BLOOD GLUCOSE TEST test strip [id number]  TEST ONE TIME DAILY FOR DIABETES Reginald Spar, MD  Active Self  TRUEplus Lancets 33G MISC [id number]  USE TO TEST ONE TIME DAILY FOR DIABETES Reginald Spar, MD  Active Self            Patient Active Problem List   Diagnosis Date Noted   Anemia    Rectal bleeding    Heme positive stool    Anticoagulated    Chronic blood loss anemia 11/12/2020   Gastrointestinal hemorrhage 10/08/2020   S/P placement of cardiac pacemaker 02/10/2020   Encounter for examination  following treatment at hospital 02/10/2020   Anxiety 02/10/2020   DM2 (diabetes mellitus, type 2) ([place]) 02/10/2020   OSA on CPAP    Abnormal  liver function 03/16/2019   History of pulmonary embolism 03/16/2019   Paroxysmal atrial fibrillation ([place]) 09/05/2018   COPD (chronic obstructive pulmonary disease) ([place]) 05/07/2018   Gastroesophageal reflux disease    Pulmonary embolism ([place]) 09/18/2017   Encounter for therapeutic drug monitoring 09/04/2017   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 08/23/2017   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   Pulmonary hypertension (HCC)    Chronic periodontitis 07/18/2017   Morbid obesity ([place])    Essential hypertension    Tobacco abuse    Chronic heart failure with preserved ejection fraction (HFpEF) ([place])    Chronic venous insufficiency     Immunization History  Administered Date(s) Administered   Influenza Inj Mdck Quad Pf 02/27/2019   Influenza,inj,Quad PF,6+ Mos 02/10/2020   Janssen (J&J) SARS-COV-2 Vaccination 09/16/2019   PFIZER(Purple Top)SARS-COV-2 Vaccination 03/15/2020   Pneumococcal Polysaccharide-23 03/17/2019    Conditions to be addressed/monitored: Atrial Fibrillation, CHF, HTN, COPD, DMII, Anxiety, and Depression  Care Plan : Medication Management  Updates made by Beryle Lathe, [place] since 01/20/2021 12:00 AM     Problem: COPD, HFpEF, HTN, T2DM, Afib, anxiety/depression   Priority: High  Onset Date: 12/28/2020     Long-Range Goal: Disease Progression Prevention   Start Date: 12/28/2020  Expected End Date: 03/28/2021  This Visit's Progress: On track  Recent Progress: On track  Priority: High  Note:   Current Barriers:  Unable to independently monitor therapeutic efficacy  Pharmacist Clinical Goal(s):  Over the next 90 days, patient will achieve adherence to monitoring guidelines and medication adherence to achieve therapeutic efficacy through collaboration with PharmD and provider.   Interventions: 1:1 collaboration with Reginald Spar, MD regarding development and update of comprehensive  plan of care as evidenced by provider attestation and co-signature Inter-disciplinary care team collaboration (see longitudinal plan of care) Comprehensive medication review performed; medication list updated in electronic medical record  Diabetes: Current medications: metformin 850 mg by mouth twice daily and Jardiance 10 mg by mouth daily  Semaglutide (Rybelsus) 14 mg refill in process with [employer]. Patient should receive within 7-10 days. Intolerances: none Taking medications as directed: yes Side effects thought to be attributed to current medication regimen: GI upset with metformin but ok with continuing for now given benefits Previously reported hypoglycemic symptoms (dizziness/blurred vision/sweaty) with BG in the 70-90s; denies BG <70 recently Current meal patterns: breakfast: eggs, oatmeal, chicken sandwhich, and baked beans ; lunch:  ribeye and [employer] in air fryer ; dinner:  leftovers from lunch ; snacks: none; drinks: water, regular soda, milk, and sports drinks Has noticed decreased appetite since starting Rybelsus Current exercise: none Controlled; Most recent A1c at goal of <7% per ADA guidelines Current glucose readings: fasting blood glucose: within goal range of 80-130 mg/dL per ADA guidelines, post prandial glucose: at goal of <180 mg/dL per ADA guidelines Medication: Identify diabetes medication and when best taken Monitoring: target blood sugar range, when to test Hypoglycemia: I have discussed with the patient how to treat hypoglycemia by the rule of 15; eat/drink 15g of sugar in the form of glucose tabs, 4 ounces of juice or soda and recheck fingerstick glucose in 15 minutes. Chocolate bars and ice cream should be avoided because fat delays carbohydrate digestion and absorption. Retreat if glucose remains low. Driving  should cease until glucose is normal. Continue metformin 850 mg by mouth twice daily, Jardiance 10 mg by mouth daily and semaglutide  (Rybelsus) 14 mg by mouth at least 30 minutes before the first food, beverage, or other oral medications of the day with no more than 4 ounces of plain water only Received notification from [facility] that application for Jardiance that patient falls below 150% federal poverty level which means he may qualify for Medicare Extra Help. Patient has been made aware and will come in next week for me to assist with Medicare Extra Help application. If approved, will send prescription to pharmacy and if denied then will submit denial to [facility].  Instructed to monitor blood sugars once a day at the following times: fasting (at least 8 hours since last food consumption) and whenever patient experiences symptoms of hypo/hyperglycemia Encouraged regular aerobic exercise with a goal of 30 minutes five times per week (150 minutes per week) Discussed management of hypoglycemia. If blood sugar <70 at any time, treat with simple sugar such as 1/2 cup juice or regular soda or 3-4 glucose tablets. Recheck blood sugar in 15 minutes and repeat if blood sugar remains <70.   Hypertension: Current medications: lisinopril 20 mg by mouth once daily Intolerances: none Taking medications as directed: yes Side effects thought to be attributed to current medication regimen: no Reports some orthostatic hypotension but knows to get up slowly Current exercise: none Home blood pressure readings: not checking currently Blood pressure under fair control. Factors affecting control of BP include high salt intake, elevated BMI, lack of exercise, sleep apnea, and volume overload. Blood pressure is at goal of <130/80 mmHg per 2017 AHA/ACC guidelines. Continue lisinopril 20 mg by mouth once daily Check BMP at next PCP visit Encourage dietary sodium restriction/DASH diet Recommend regular aerobic exercise Recommend home blood pressure monitoring, to bring results in next visit Discussed need for and importance of continued work on weight  loss Discussed need for medication compliance Reviewed risks of hypertension, principles of treatment and consequences of untreated hypertension Patient getting CPAP on 01/20/21 which will likely improve blood pressure   Heart failure with preserved ejection fraction (LVEF >50% with evidence of spontaneous or provokable increased LV filling pressures): Appropriately managed; recently added SGLT2 inhibitor since serum creatinine improved  Has pacemaker Current treatment: lisinopril 20 mg by mouth once daily, metoprolol tartrate 100 mg by mouth twice daily, empagliflozin (Jardiance) 10 mg by mouth once daily, furosemide 40 mg twice daily, and metolazone 72m mg by mouth weekly as needed Stage C (Symptomatic heart failure)/NYHA Class III (Marked limitation of physical activity. Comfortable at rest. Less than ordinary activity causes fatigue, palpitation, or dyspnea) Most recent echocardiogram is unknown Current home vitals: not checking Current home weights: patient not sure Reports shortness of breath with activity or when lying down, fatigue and weakness, swelling in the legs, ankles and feet, and reduced ability to exercise. Denies persistent cough or wheezing, nausea and lack of appetite, and chest pain Continue lisinopril 20 mg by mouth once daily, metoprolol tartrate 100 mg by mouth twice daily, torsemide 40 mg twice daily, and metolazone 5 mg by mouth weekly as needed  Consider transition from metoprolol tartrate to metoprolol succinate to decrease pill burden per patient request.  Check BMP with cardiology Encourage dietary sodium restriction (<3 g/day) Educated on the importance of weighing daily. Patient aware to contact cardiology/primary care team if weight gain >3 lbs in 1 day or >5 lbs in 1 week Recommend  home blood pressure monitoring, to bring results in next visit Discussed need for and importance of continued work on weight loss Discussed need for medication compliance Recommend  restricted fluid intake (eg, 2 L/day)  Chronic Obstructive Pulmonary Disease: Controlled per patient - only uses albuterol as needed  Patient has been without CPAP machine for a while but will get a new machine early September and plans to use Stopped smoking 08/23/2017; smoked for 30 years (1 PPD) Current treatment: albuterol metered dose (ProAir, Ventolin, Proventil) 1 puff by mouth as needed for shortness of breath; only using ~2 times per week GOLD Classification: unknown CAT score (12/28/20): 23 Most recent Pulmonary Function Testing: N/A 0 exacerbations requiring treatment in the last 6 months  Current oxygen requirements: none Given elevated CAT score >10, would consider adding a LAMA such as Spiriva Respimat.   Atrial Fibrillation: Controlled Current rate control: metoprolol tartrate 100 mg by mouth twice daily Most recent ECG: normal sinus Anticoagulation: rivaroxaban (Xarelto) 20 mg by mouth once daily Reports bleeding event during last hospitalization but none recently  CHADS2VASc score: 3 (CHF, hypertension, and diabetes) Prior ablation: '[]'  Yes  '[x]'  No Home blood pressure: not checking Home heart rate: not checking Continue metoprolol  Continue rivaroxaban (Xarelto) 20 mg by mouth once daily Encouraged regular physical activity for general health benefits Recommend home blood pressure and heart rate monitoring, to bring results in next visit Discussed need for medication compliance  Anxiety/Depression: Uncontrolled per patient  Current medication: bupropion XL 150 mg by mouth daily (recently discontinued citalopram and buspirone) Patient reports not being able to take middle of the day dose of buspirone and dose not feel like he is optimally managed.  Plan to titrate bupropion to 300 mg daily after 4 weeks on lower dose.   Patient Goals/Self-Care Activities Over the next 90 days, patient will:  take medications as prescribed check glucose at least once daily, document,  and provide at future appointments check blood pressure at least once daily, document, and provide at future appointments weigh daily, and contact provider if weight gain of 3 lbs in a day or more than 5 lbs in a week  Follow Up Plan: Face to Face appointment with care management team member scheduled for:  01/25/21      Medication Assistance: Rybelsus through [employer] medication assistance program. Patient also needs to apply for Medicare Extra Help.  See plan of care for additional detail.  Patient's preferred pharmacy is:  [facility] [address] [place] [address] Phone: [phone] Fax: [facility] - [place], [place] [address] Phone: [phone] Fax: [phone]  Follow Up:  Patient agrees to Care Plan and Follow-up.  Plan: Face to Face appointment with care management team member scheduled for: 01/25/21  Kennon Holter, PharmD Clinical Pharmacist [facility] [phone]

## 2021-01-20 NOTE — Patient Instructions (Signed)
Reginald [name],  It was great to talk to you today! I will see you on Tuesday 01/25/21 to apply for Medicare Extra Help.  Please call me with any questions or concerns.   Visit Information  PATIENT GOALS:  Goals Addressed             This Visit's Progress    Medication Management       Patient Goals/Self-Care Activities Over the next 90 days, patient will:  take medications as prescribed check glucose at least once daily, document, and provide at future appointments check blood pressure at least once daily, document, and provide at future appointments weigh daily, and contact provider if weight gain of 3 lbs in a day or more than 5 lbs in a week           Patient verbalizes understanding of instructions provided today and agrees to view in [place].   Face to Face appointment with care management team member scheduled for: 01/25/21 Kennon Holter, PharmD Clinical Pharmacist [facility] [phone]

## 2021-01-21 ENCOUNTER — Other Ambulatory Visit: Payer: Self-pay

## 2021-01-21 ENCOUNTER — Ambulatory Visit: Payer: Medicare HMO | Admitting: Internal Medicine

## 2021-01-21 ENCOUNTER — Encounter: Payer: Self-pay | Admitting: Internal Medicine

## 2021-01-21 VITALS — BP 130/80 | HR 70 | Ht 71.0 in | Wt >= 6400 oz

## 2021-01-21 DIAGNOSIS — I5032 Chronic diastolic (congestive) heart failure: Secondary | ICD-10-CM

## 2021-01-21 DIAGNOSIS — I442 Atrioventricular block, complete: Secondary | ICD-10-CM

## 2021-01-21 DIAGNOSIS — D6869 Other thrombophilia: Secondary | ICD-10-CM | POA: Diagnosis not present

## 2021-01-21 DIAGNOSIS — I480 Paroxysmal atrial fibrillation: Secondary | ICD-10-CM

## 2021-01-21 NOTE — Progress Notes (Signed)
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  Since last being seen in our clinic, the patient reports doing very well.  Today, he denies symptoms of palpitations, chest pain, shortness of breath,  lower extremity edema, dizziness, presyncope, or syncope.  The patient is otherwise without complaint today.   Past Medical History:  Diagnosis Date   Anxiety    Aortic stenosis    Arthritis    Asthma    Back pain    Cellulitis of skin with lymphangitis    CHF (congestive heart failure) (HCC)    Chronic diastolic congestive heart failure (HCC)    Chronic venous insufficiency    Constipation    COPD (chronic obstructive pulmonary disease) (HCC)    Depression    Diabetes ([place])    Dyspnea    Essential hypertension    Gout    Heart murmur    Hypertension    Morbid obesity ([place])    Obesity    Pneumonia    walking pneumonia   Prostatitis    Pulmonary embolism ([place])    Pulmonary hypertension (HCC)    Pyelonephritis    S/P aortic valve replacement with bioprosthetic valve 08/23/2017   25 mm Edwards Inspiris Resilia stented bovine pericardial tissue valve   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   24 mm Hemashield supracoronary straight graft    Sleep apnea    cpap    Thoracic ascending aortic aneurysm (HCC)    Thoracic ascending aortic aneurysm (HCC)    Tobacco abuse    Past Surgical History:  Procedure Laterality Date   AORTIC VALVE REPLACEMENT N/A 08/23/2017   Procedure: AORTIC VALVE REPLACEMENT (AVR) USING INSPIRIS RESILIA AORTIC VALVE SIZE 25 MM;  Surgeon: Rexene Alberts, MD;  Location: [place];  Service: Open Heart Surgery;  Laterality: N/A;   CARDIAC VALVE REPLACEMENT N/A    [employer] 02/07/2020   COLONOSCOPY WITH PROPOFOL N/A 11/14/2020   Procedure: COLONOSCOPY WITH PROPOFOL;  Surgeon: Gatha Mayer,  MD;  Location: [facility] ENDOSCOPY;  Service: Endoscopy;  Laterality: N/A;   ESOPHAGOGASTRODUODENOSCOPY (EGD) WITH PROPOFOL N/A 11/14/2020   Procedure: ESOPHAGOGASTRODUODENOSCOPY (EGD) WITH PROPOFOL;  Surgeon: Gatha Mayer, MD;  Location: [place];  Service: Endoscopy;  Laterality: N/A;   MULTIPLE EXTRACTIONS WITH ALVEOLOPLASTY N/A 07/23/2017   Procedure: Extraction of tooth #10 with alveoloplasty and gross debridement of remaining teeth;  Surgeon: Lenn Cal, DDS;  Location: [place];  Service: Oral Surgery;  Laterality: N/A;   PACEMAKER IMPLANT N/A 08/28/2017    St Jude [employer] Assurity MRI conditional  dual-chamber pacemaker for symptomatic complete heart blockby Dr Rayann Heman   TEE WITHOUT CARDIOVERSION N/A 07/16/2017   Procedure: TRANSESOPHAGEAL ECHOCARDIOGRAM (TEE);  Surgeon: Lelon Perla, MD;  Location: [facility] ENDOSCOPY;  Service: Cardiovascular;  Laterality: N/A;   TEE WITHOUT CARDIOVERSION N/A 08/23/2017   Procedure: TRANSESOPHAGEAL ECHOCARDIOGRAM (TEE);  Surgeon: Rexene Alberts, MD;  Location: [place];  Service: Open Heart Surgery;  Laterality: N/A;   THORACIC AORTIC ANEURYSM REPAIR N/A 08/23/2017   Procedure: THORACIC ASCENDING ANEURYSM REPAIR (AAA) USING HEMASHIELD GOLD KNITTED MICROVEL DOUBLE VELOUR VASCULAR GRAFT D: 24 MM  L: 30 CM;  Surgeon: Rexene Alberts, MD;  Location: [place];  Service: Open Heart Surgery;  Laterality: N/A;    ROS- all systems are reviewed and negative except as per HPI  above  Current Outpatient Medications  Medication Sig Dispense Refill   acetaminophen (TYLENOL) 325 MG tablet Take 650 mg by mouth every 6 (six) hours as needed for mild pain.     albuterol (PROVENTIL) (2.5 MG/3ML) 0.083% nebulizer solution INHALE 1 VIAL VIA NEBULIZER EVERY 6 HOURS AS NEEDED FOR WHEEZING OR SHORTNESS OF BREATH 270 mL 1   albuterol (VENTOLIN HFA) 108 (90 Base) MCG/ACT inhaler INHALE 2 PUFFS INTO THE LUNGS EVERY 6 (SIX) HOURS AS NEEDED FOR WHEEZING OR SHORTNESS OF BREATH. 1 each  1   allopurinol (ZYLOPRIM) 300 MG tablet TAKE 1 TABLET EVERY DAY 90 tablet 0   ammonium lactate (AMLACTIN) 12 % lotion Apply 1 application topically as needed for dry skin. 400 g 0   atorvastatin (LIPITOR) 40 MG tablet TAKE 1 TABLET EVERY DAY 90 tablet 0   Blood Glucose Monitoring Suppl (TRUE METRIX METER) w/Device KIT USE AS DIRECTED 1 kit 0   buPROPion (WELLBUTRIN XL) 150 MG 24 hr tablet Take 1 tablet (150 mg total) by mouth daily. 30 tablet 0   [START ON 02/17/2021] buPROPion (WELLBUTRIN XL) 300 MG 24 hr tablet Take 1 tablet (300 mg total) by mouth daily. 90 tablet 3   diclofenac Sodium (VOLTAREN) 1 % GEL Apply 2 g topically 4 (four) times daily. 150 g 1   docusate sodium (COLACE) 100 MG capsule Take 2 capsules (200 mg total) by mouth 2 (two) times daily. 30 capsule 0   empagliflozin (JARDIANCE) 10 MG TABS tablet Take 1 tablet (10 mg total) by mouth daily before breakfast. 30 tablet 11   FEROSUL 325 (65 Fe) MG tablet TAKE 1 TABLET EVERY DAY 90 tablet 1   fexofenadine (ALLEGRA) 180 MG tablet Take 180 mg by mouth daily.     gabapentin (NEURONTIN) 100 MG capsule Take 1 capsule in the morning and 2 capsules in the evening. 90 capsule 0   Krill Oil 350 MG CAPS Take 1 capsule by mouth in the morning and at bedtime.      lisinopril (ZESTRIL) 20 MG tablet Take 1 tablet (20 mg total) by mouth daily. 90 tablet 3   loratadine (CLARITIN) 10 MG tablet Take 10 mg by mouth daily as needed for allergies.     metFORMIN (GLUCOPHAGE) 850 MG tablet Take 1 tablet (850 mg total) by mouth 2 (two) times daily with a meal. 180 tablet 1   metolazone (ZAROXOLYN) 5 MG tablet PRN 90 tablet 3   metoprolol tartrate (LOPRESSOR) 100 MG tablet TAKE 1 TABLET TWICE DAILY 180 tablet 3   pantoprazole (PROTONIX) 40 MG tablet Take 1 tablet (40 mg total) by mouth daily. 90 tablet 3   polyethylene glycol (MIRALAX) 17 g packet Take 17 g by mouth daily as needed. 14 each 0   potassium chloride SA (KLOR-CON) 20 MEQ tablet TAKE 3 TABS  DAILY AND TAKE AN ADDITIONAL 1 TAB ON THE DAY YOU TAKE YOUR WEEKLY METOLAZONE DOSE 283 tablet 3   rivaroxaban (XARELTO) 20 MG TABS tablet TAKE 1 Tablet BY MOUTH ONCE EVERY DAY WITH SUPPER 90 tablet 3   Semaglutide (RYBELSUS) 14 MG TABS Take 14 mg by mouth in the morning.     terbinafine (LAMISIL) 250 MG tablet Take 1 tablet by mouth once daily 28 tablet 0   torsemide (DEMADEX) 20 MG tablet Take 40 mg in the AM and Take 20 mg in the PM 90 tablet 11   TRUE METRIX BLOOD GLUCOSE TEST test strip TEST ONE TIME DAILY FOR DIABETES 100 strip  5   TRUEplus Lancets 33G MISC USE TO TEST ONE TIME DAILY FOR DIABETES 100 each 5   No current facility-administered medications for this visit.    Physical Exam: Vitals:   01/21/21 0816  BP: 130/80  Pulse: 70  SpO2: 100%  Weight: (!) 476 lb (215.9 kg)  Height: _0  (1.803 m)    GEN- The patient is morbidly obese appearing, alert and oriented x 3 today.   Walks slowly with a cane. Head- normocephalic, atraumatic Eyes-  Sclera clear, conjunctiva pink Ears- hearing intact Oropharynx- clear Lungs- Clear to ausculation bilaterally, normal work of breathing Chest- pacemaker pocket is well healed Heart- Regular rate and rhythm, no murmurs, rubs or gallops, PMI not laterally displaced GI- soft, NT, ND, + BS Extremities- + dependant edema  Pacemaker interrogation- reviewed in detail today,  See PACEART report    Echo 07/28/20- EF 60%  Assessment and Plan:  1. Symptomatic second degree heart block Normal pacemaker function See Pace Art report No changes today he is not device dependant today  2. Afib Burden is <1% He is on xarelto for afib as well as prior PTE Chads2vasc score is at least 5  3. HTN Stable No change required today  4. Compliance with CPAP advised  5. Obesity Body mass index is 66.39 kg/m. Lifestyle modification is advised Our ability to treat is multiple comorbidities is limited by his size  6. S/p bioprosthetic AVR with  thoracic aneurysm repair Stable No change required today  7. Chronic diastolic dysfunction Stable No change required today    Risks, benefits and potential toxicities for medications prescribed and/or refilled reviewed with patient today.   Return in a year  Thompson Grayer MD, [facility] 01/21/2021 9:04 AM

## 2021-01-21 NOTE — Patient Instructions (Signed)
Medication Instructions:  Continue all current medications.  Labwork: none  Testing/Procedures: none  Follow-Up: 1 year   Any Other Special Instructions Will Be Listed Below (If Applicable).  If you need a refill on your cardiac medications before your next appointment, please call your pharmacy.  

## 2021-01-25 ENCOUNTER — Ambulatory Visit: Payer: Medicare HMO | Admitting: Pharmacist

## 2021-01-25 ENCOUNTER — Other Ambulatory Visit: Payer: Self-pay | Admitting: Internal Medicine

## 2021-01-25 ENCOUNTER — Ambulatory Visit: Payer: Medicare HMO

## 2021-01-25 ENCOUNTER — Other Ambulatory Visit: Payer: Self-pay

## 2021-01-25 DIAGNOSIS — F419 Anxiety disorder, unspecified: Secondary | ICD-10-CM

## 2021-01-25 DIAGNOSIS — I5032 Chronic diastolic (congestive) heart failure: Secondary | ICD-10-CM

## 2021-01-25 DIAGNOSIS — I480 Paroxysmal atrial fibrillation: Secondary | ICD-10-CM

## 2021-01-25 DIAGNOSIS — B351 Tinea unguium: Secondary | ICD-10-CM

## 2021-01-25 DIAGNOSIS — E1169 Type 2 diabetes mellitus with other specified complication: Secondary | ICD-10-CM

## 2021-01-25 DIAGNOSIS — Z20822 Contact with and (suspected) exposure to covid-19: Secondary | ICD-10-CM | POA: Diagnosis not present

## 2021-01-25 DIAGNOSIS — I10 Essential (primary) hypertension: Secondary | ICD-10-CM

## 2021-01-25 DIAGNOSIS — F339 Major depressive disorder, recurrent, unspecified: Secondary | ICD-10-CM

## 2021-01-25 DIAGNOSIS — J449 Chronic obstructive pulmonary disease, unspecified: Secondary | ICD-10-CM

## 2021-01-25 LAB — CUP PACEART INCLINIC DEVICE CHECK
Battery Remaining Longevity: 108 mo
Battery Voltage: 3.02 V
Brady Statistic RA Percent Paced: 1.5 %
Brady Statistic RV Percent Paced: 0.44 %
Date Time Interrogation Session: 20220902083404
Implantable Lead Implant Date: 20190409
Implantable Lead Implant Date: 20190409
Implantable Lead Location: 753859
Implantable Lead Location: 753860
Implantable Pulse Generator Implant Date: 20190409
Lead Channel Impedance Value: 562.5 Ohm
Lead Channel Impedance Value: 575 Ohm
Lead Channel Pacing Threshold Amplitude: 0.5 V
Lead Channel Pacing Threshold Amplitude: 0.75 V
Lead Channel Pacing Threshold Pulse Width: 0.5 ms
Lead Channel Pacing Threshold Pulse Width: 0.5 ms
Lead Channel Sensing Intrinsic Amplitude: 4.5 mV
Lead Channel Sensing Intrinsic Amplitude: 5 mV
Lead Channel Setting Pacing Amplitude: 2 V
Lead Channel Setting Pacing Amplitude: 2.5 V
Lead Channel Setting Pacing Pulse Width: 0.5 ms
Lead Channel Setting Sensing Sensitivity: 1.5 mV
Pulse Gen Model: 2272
Pulse Gen Serial Number: 9010865

## 2021-01-25 NOTE — Chronic Care Management (AMB) (Signed)
Chronic Care Management Pharmacy Note  01/25/2021 Name:  Reginald [name] MRN:  [id number] DOB:  January 05, 1971  Summary: Patient presents with possible Covid-19 symptoms today. Appointment changed to telephone and Covid-19 test completed.  Medicare Extra Help application submitted today. Based on information provided, patient will likely be denied. Will plan to submit denial to [facility] for Jardiance patient assistance. Patient aware to bring me the determination letter when he receives in the mail. Patient reports reports blood glucose of 360 a couple hours after dinner when he checked recently; he is unsure if this is usual for him as he does not usually check blood glucose after dinner. Patient was instructed to start checking post-prandial blood glucose more frequently to monitor for trends.  Patient reports tolerating bupropion well but that he is still irritable. Plan to titrate bupropion to 300 mg daily after 4 weeks on lower dose.   Subjective: Reginald [name] is an 50 y.o. year old male who is a primary patient of [name], MD.  The CCM team was consulted for assistance with disease management and care coordination needs.    Engaged with patient by telephone for follow up visit in response to provider referral for pharmacy case management and/or care coordination services.   Consent to Services:  The patient was given information about Chronic Care Management services, agreed to services, and gave verbal consent prior to initiation of services.  Please see initial visit note for detailed documentation.   Patient Care Team: [name], MD as PCP - General (Internal Medicine) Thompson Grayer, MD as PCP - Electrophysiology (Cardiology) Josue Hector, MD as PCP - Cardiology (Cardiology) Kassie Mends, RN as [place] Management [place], Ranae Pila, LCSW as Social Worker (Licensed Clinical Social Worker) Beryle Lathe, [facility]  (Pharmacist)  Objective:  Lab Results  Component Value Date   CREATININE 1.18 01/05/2021   CREATININE 2.28 (H) 12/20/2020   CREATININE 1.57 (H) 11/24/2020    Lab Results  Component Value Date   HGBA1C 5.4 11/12/2020   Last diabetic Eye exam: No results found for: HMDIABEYEEXA  Last diabetic Foot exam: No results found for: HMDIABFOOTEX      Component Value Date/Time   CHOL 123 06/10/2020 0837   TRIG 281 (H) 06/10/2020 0837   HDL 25 (L) 06/10/2020 0837   CHOLHDL 4.9 06/10/2020 0837   CHOLHDL 6.6 01/01/2020 1014   VLDL 73 (H) 01/01/2020 1014   [place] 54 06/10/2020 0837    Hepatic Function Latest Ref Rng & Units 11/24/2020 11/15/2020 11/14/2020  Total Protein 6.0 - 8.5 g/dL 7.9 8.0 7.8  Albumin 4.0 - 5.0 g/dL 4.3 3.6 3.6  AST 0 - 40 IU/L 41(H) 32 30  ALT 0 - 44 IU/L '27 23 23  ' Alk Phosphatase 44 - 121 IU/L 98 76 72  Total Bilirubin 0.0 - 1.2 mg/dL 0.7 1.4(H) 1.2  Bilirubin, Direct 0.0 - 0.2 mg/dL - - -    Lab Results  Component Value Date/Time   TSH 4.060 10/04/2020 09:43 AM   TSH 5.240 (H) 06/10/2020 08:37 AM   FREET4 1.30 10/04/2020 09:43 AM   FREET4 0.79 (L) 05/03/2018 02:04 PM    CBC Latest Ref Rng & Units 01/05/2021 11/24/2020 11/15/2020  WBC 4.0 - 10.5 K/uL 10.7(H) 8.0 10.9(H)  Hemoglobin 13.0 - 17.0 g/dL 10.5(L) 12.0(L) 10.4(L)  Hematocrit 39.0 - 52.0 % 35.3(L) 40.9 35.1(L)  Platelets 150 - 400 K/uL 261 320 240    No results  found for: VD25OH  Clinical ASCVD: No  The ASCVD Risk score Mikey Bussing DC Jr., et al., 2013) failed to calculate for the following reasons:   The valid total cholesterol range is 130 to 320 mg/dL    Social History   Tobacco Use  Smoking Status Former   Packs/day: 2.00   Years: 16.00   Pack years: 32.00   Types: Cigarettes   Quit date: 08/23/2017   Years since quitting: 3.4  Smokeless Tobacco Never   BP Readings from Last 3 Encounters:  01/21/21 130/80  01/12/21 128/70  11/26/20 138/74   Pulse Readings from Last 3 Encounters:   01/21/21 70  01/12/21 69  11/26/20 68   Wt Readings from Last 3 Encounters:  01/21/21 (!) 476 lb (215.9 kg)  01/12/21 (!) 471 lb (213.6 kg)  11/26/20 (!) 463 lb 12.8 oz (210.4 kg)    Assessment: Review of patient past medical history, allergies, medications, health status, including review of consultants reports, laboratory and other test data, was performed as part of comprehensive evaluation and provision of chronic care management services.   SDOH:  (Social Determinants of Health) assessments and interventions performed:    CCM Care Plan  No Known Allergies  Medications Reviewed Today     Reviewed by Beryle Lathe, [facility] (Pharmacist) on 01/25/21 at 0934  Med List Status: <None>   Medication Order Taking? Sig Documenting Provider Last Dose Status Informant  acetaminophen (TYLENOL) 325 MG tablet [id number] Yes Take 650 mg by mouth every 6 (six) hours as needed for mild pain. [provider] Taking Active Self  albuterol (PROVENTIL) (2.5 MG/3ML) 0.083% nebulizer solution [id number] Yes INHALE 1 VIAL VIA NEBULIZER EVERY 6 HOURS AS NEEDED FOR WHEEZING OR SHORTNESS OF Sara Chu, MD Taking Active Self           Med Note Lia Hopping, MINDY L   Fri Jan 21, 2021  8:16 AM)    albuterol (VENTOLIN HFA) 108 (90 Base) MCG/ACT inhaler [id number] Yes INHALE 2 PUFFS INTO THE LUNGS EVERY 6 (SIX) HOURS AS NEEDED FOR WHEEZING OR SHORTNESS OF BREATH. [name], MD Taking Active            Med Note Lia Hopping, Toniann Fail   Fri Jan 21, 2021  8:16 AM)    allopurinol (ZYLOPRIM) 300 MG tablet [id number] Yes TAKE 1 TABLET EVERY DAY [name], MD Taking Active Self  ammonium lactate (AMLACTIN) 12 % lotion [id number] Yes Apply 1 application topically as needed for dry skin. Felipa Furnace, DPM Taking Active Self  atorvastatin (LIPITOR) 40 MG tablet [id number] Yes TAKE 1 TABLET EVERY DAY [name], MD Taking Active Self  Blood Glucose Monitoring Suppl (TRUE METRIX METER) w/Device  KIT [id number]  USE AS DIRECTED [name], MD  Active Self  buPROPion (WELLBUTRIN XL) 150 MG 24 hr tablet [id number] Yes Take 1 tablet (150 mg total) by mouth daily. [name], MD Taking Active   buPROPion (WELLBUTRIN XL) 300 MG 24 hr tablet [id number] No Take 1 tablet (300 mg total) by mouth daily.  Patient not taking: Reported on 01/25/2021   [name], MD Not Taking Active   diclofenac Sodium (VOLTAREN) 1 % GEL [id number] Yes Apply 2 g topically 4 (four) times daily. Mordecai Rasmussen, MD Taking Active Self  docusate sodium (COLACE) 100 MG capsule [id number] Yes Take 2 capsules (200 mg total) by mouth 2 (two) times daily. Thurnell Lose, MD Taking Active   empagliflozin (JARDIANCE)  10 MG TABS tablet [id number] Yes Take 1 tablet (10 mg total) by mouth daily before breakfast. Waynetta Pean Taking Active   FEROSUL 325 (65 Fe) MG tablet [id number] Yes TAKE 1 TABLET EVERY DAY [name], MD Taking Active   fexofenadine (ALLEGRA) 180 MG tablet [id number] Yes Take 180 mg by mouth daily. [provider] Taking Active   gabapentin (NEURONTIN) 100 MG capsule [id number] Yes Take 1 capsule in the morning and 2 capsules in the evening. Thurnell Lose, MD Taking Active   Krill Oil 350 MG CAPS [id number] Yes Take 1 capsule by mouth in the morning and at bedtime.  [provider] Taking Active Self           Med Note Lia Hopping, MINDY L   Fri Jan 21, 2021  8:16 AM)    lisinopril (ZESTRIL) 20 MG tablet [id number] Yes Take 1 tablet (20 mg total) by mouth daily. Erma Heritage, PA-C Taking Active Self  loratadine (CLARITIN) 10 MG tablet [id number] Yes Take 10 mg by mouth daily as needed for allergies. [provider] Taking Active   metFORMIN (GLUCOPHAGE) 850 MG tablet [id number] Yes Take 1 tablet (850 mg total) by mouth 2 (two) times daily with a meal. [name], MD Taking Active Self  metolazone (ZAROXOLYN) 5 MG tablet [id number] Yes PRN Allred, Jeneen Rinks, MD  Taking Active   metoprolol tartrate (LOPRESSOR) 100 MG tablet [id number] Yes TAKE 1 TABLET TWICE DAILY Josue Hector, MD Taking Active   pantoprazole (PROTONIX) 40 MG tablet [id number] Yes Take 1 tablet (40 mg total) by mouth daily. [name], MD Taking Active Self  polyethylene glycol (MIRALAX) 17 g packet [id number] Yes Take 17 g by mouth daily as needed. Thurnell Lose, MD Taking Active   potassium chloride SA (KLOR-CON) 20 MEQ tablet [id number] Yes TAKE 3 TABS DAILY AND TAKE AN ADDITIONAL 1 TAB ON THE DAY YOU TAKE YOUR WEEKLY METOLAZONE DOSE Ahmed Prima, Fransisco Hertz, PA-C Taking Active   rivaroxaban (XARELTO) 20 MG TABS tablet [id number] Yes TAKE 1 Tablet BY MOUTH ONCE EVERY DAY WITH SUPPER Josue Hector, MD Taking Active Self  Semaglutide (RYBELSUS) 14 MG TABS [id number] No Take 14 mg by mouth in the morning. [provider] Unknown Active            Med Note Lia Hopping, MINDY L   Fri Jan 21, 2021  8:16 AM)    terbinafine (LAMISIL) 250 MG tablet [id number] Yes Take 1 tablet by mouth once daily [name], MD Taking Active   torsemide (DEMADEX) 20 MG tablet [id number] Yes Take 40 mg in the AM and Take 20 mg in the PM [place], [place] M, [place] Taking Active Self  TRUE METRIX BLOOD GLUCOSE TEST test strip [id number]  TEST ONE TIME DAILY FOR DIABETES [name], MD  Active Self  TRUEplus Lancets 33G MISC [id number]  USE TO TEST ONE TIME DAILY FOR DIABETES [name], MD  Active Self            Patient Active Problem List   Diagnosis Date Noted   Anemia    Rectal bleeding    Heme positive stool    Anticoagulated    Chronic blood loss anemia 11/12/2020   Gastrointestinal hemorrhage 10/08/2020   S/P placement of cardiac pacemaker 02/10/2020   Encounter for examination following treatment at hospital 02/10/2020   Anxiety 02/10/2020   DM2 (diabetes mellitus, type 2) ([place]) 02/10/2020   OSA on CPAP  Abnormal liver function 03/16/2019   History of pulmonary embolism  03/16/2019   Paroxysmal atrial fibrillation ([place]) 09/05/2018   COPD (chronic obstructive pulmonary disease) ([place]) 05/07/2018   Gastroesophageal reflux disease    Pulmonary embolism ([place]) 09/18/2017   Encounter for therapeutic drug monitoring 09/04/2017   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 08/23/2017   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   Pulmonary hypertension (HCC)    Chronic periodontitis 07/18/2017   Morbid obesity ([place])    Essential hypertension    Tobacco abuse    Chronic heart failure with preserved ejection fraction (HFpEF) ([place])    Chronic venous insufficiency     Immunization History  Administered Date(s) Administered   Influenza Inj Mdck Quad Pf 02/27/2019   Influenza,inj,Quad PF,6+ Mos 02/10/2020   Janssen (J&J) SARS-COV-2 Vaccination 09/16/2019   PFIZER(Purple Top)SARS-COV-2 Vaccination 03/15/2020   Pneumococcal Polysaccharide-23 03/17/2019    Conditions to be addressed/monitored: Atrial Fibrillation, CHF, HTN, COPD, DMII, Anxiety, and Depression  Care Plan : Medication Management  Updates made by Beryle Lathe, [place] since 01/25/2021 12:00 AM     Problem: COPD, HFpEF, HTN, T2DM, Afib, anxiety/depression   Priority: High  Onset Date: 12/28/2020     Long-Range Goal: Disease Progression Prevention   Start Date: 12/28/2020  Expected End Date: 03/28/2021  Recent Progress: On track  Priority: High  Note:   Current Barriers:  Unable to independently monitor therapeutic efficacy  Pharmacist Clinical Goal(s):  Over the next 90 days, patient will achieve adherence to monitoring guidelines and medication adherence to achieve therapeutic efficacy through collaboration with PharmD and provider.   Interventions: 1:1 collaboration with [name], MD regarding development and update of comprehensive plan of care as evidenced by provider attestation and co-signature Inter-disciplinary care  team collaboration (see longitudinal plan of care) Comprehensive medication review performed; medication list updated in electronic medical record  Diabetes: Current medications: metformin 850 mg by mouth twice daily and Jardiance 10 mg by mouth daily  Semaglutide (Rybelsus) 14 mg refill in process with [employer]. Patient should receive soon. Intolerances: none Taking medications as directed: yes Side effects thought to be attributed to current medication regimen: GI upset with metformin but ok with continuing for now given benefits Previously reported hypoglycemic symptoms (dizziness/blurred vision/sweaty) with BG in the 70-90s; denies BG <70 recently Current meal patterns: breakfast: eggs, oatmeal, chicken sandwhich, and baked beans ; lunch:  ribeye and [employer] Breast in air fryer ; dinner:  leftovers from lunch ; snacks: none; drinks: water, regular soda, milk, and sports drinks Has noticed decreased appetite since starting Rybelsus Current exercise: none Controlled; Most recent A1c at goal of <7% per ADA guidelines Current glucose readings: fasting blood glucose: within goal range of 80-130 mg/dL per ADA guidelines, post prandial glucose: above goal of <180 mg/dL per ADA guidelines (patient reports blood glucose of 360 a couple hours after dinner when he checked recently; he is unsure if this is usual for him as he does not usually check blood glucose after dinner) Medication: Identify diabetes medication and when best taken Monitoring: target blood sugar range, when to test Hypoglycemia: I have discussed with the patient how to treat hypoglycemia by the rule of 15; eat/drink 15g of sugar in the form of glucose tabs, 4 ounces of juice or soda and recheck fingerstick glucose in 15 minutes. Chocolate bars and ice cream should be avoided because fat delays carbohydrate digestion and absorption. Retreat if glucose remains low. Driving  should cease until glucose is normal. Continue  metformin 850 mg by mouth twice daily, Jardiance 10 mg by mouth daily and semaglutide (Rybelsus) 14 mg by mouth at least 30 minutes before the first food, beverage, or other oral medications of the day with no more than 4 ounces of plain water only Medicare Extra Help application submitted today. Based on information provided, patient will likely be denied. Will plan to submit denial to [facility] for Jardiance patient assistance.  Instructed to monitor blood sugars once a day at the following times: fasting (at least 8 hours since last food consumption), 2 hours after dinner, and whenever patient experiences symptoms of hypo/hyperglycemia Encouraged regular aerobic exercise with a goal of 30 minutes five times per week (150 minutes per week) Discussed management of hypoglycemia. If blood sugar <70 at any time, treat with simple sugar such as 1/2 cup juice or regular soda or 3-4 glucose tablets. Recheck blood sugar in 15 minutes and repeat if blood sugar remains <70.   Hypertension: Current medications: lisinopril 20 mg by mouth once daily Intolerances: none Taking medications as directed: yes Side effects thought to be attributed to current medication regimen: no Reports some orthostatic hypotension but knows to get up slowly Current exercise: none Home blood pressure readings: not checking currently Blood pressure under fair control. Factors affecting control of BP include high salt intake, elevated BMI, lack of exercise, sleep apnea, and volume overload. Blood pressure is at goal of <130/80 mmHg per 2017 AHA/ACC guidelines. Continue lisinopril 20 mg by mouth once daily Check BMP at next PCP visit Encourage dietary sodium restriction/DASH diet Recommend regular aerobic exercise Recommend home blood pressure monitoring, to bring results in next visit Discussed need for and importance of continued work on weight loss Discussed need for medication compliance Reviewed risks of hypertension,  principles of treatment and consequences of untreated hypertension Patient received CPAP on 01/20/21 which will likely improve blood pressure   Heart failure with preserved ejection fraction (LVEF >50% with evidence of spontaneous or provokable increased LV filling pressures): Appropriately managed; recently added SGLT2 inhibitor since serum creatinine improved  Has pacemaker Current treatment: lisinopril 20 mg by mouth once daily, metoprolol tartrate 100 mg by mouth twice daily, empagliflozin (Jardiance) 10 mg by mouth once daily, furosemide 40 mg twice daily, and metolazone 34m mg by mouth weekly as needed Stage C (Symptomatic heart failure)/NYHA Class III (Marked limitation of physical activity. Comfortable at rest. Less than ordinary activity causes fatigue, palpitation, or dyspnea) Most recent echocardiogram is unknown Current home vitals: not checking Current home weights: patient not sure Reports shortness of breath with activity or when lying down, fatigue and weakness, swelling in the legs, ankles and feet, and reduced ability to exercise. Denies persistent cough or wheezing, nausea and lack of appetite, and chest pain Continue Jardiance 10 mg by mouth daily, lisinopril 20 mg by mouth once daily, metoprolol tartrate 100 mg by mouth twice daily, torsemide 40 mg twice daily, and metolazone 5 mg by mouth weekly as needed  Consider transition from metoprolol tartrate to metoprolol succinate to decrease pill burden per patient request.  Check BMP with cardiology Encourage dietary sodium restriction (<3 g/day) Educated on the importance of weighing daily. Patient aware to contact cardiology/primary care team if weight gain >3 lbs in 1 day or >5 lbs in 1 week Recommend home blood pressure monitoring, to bring results in next visit Discussed need for and importance of continued work on weight loss Discussed need for medication compliance  Recommend restricted fluid intake (eg, 2 L/day)  Chronic  Obstructive Pulmonary Disease: Controlled per patient - only uses albuterol as needed  Patient now has CPAP machine Stopped smoking 08/23/2017; smoked for 30 years (1 PPD) Current treatment: albuterol metered dose (ProAir, Ventolin, Proventil) 1 puff by mouth as needed for shortness of breath; only using ~2 times per week GOLD Classification: unknown CAT score (12/28/20): 23 Most recent Pulmonary Function Testing: N/A 0 exacerbations requiring treatment in the last 6 months  Current oxygen requirements: none Given elevated CAT score >10, would consider adding a LAMA such as Spiriva Respimat.   Atrial Fibrillation: Controlled Current rate control: metoprolol tartrate 100 mg by mouth twice daily Most recent ECG: normal sinus Anticoagulation: rivaroxaban (Xarelto) 20 mg by mouth once daily Reports bleeding event during last hospitalization but none recently  CHADS2VASc score: 3 (CHF, hypertension, and diabetes) Prior ablation: '[]'  Yes  '[x]'  No Home blood pressure: not checking Home heart rate: not checking Continue metoprolol  Continue rivaroxaban (Xarelto) 20 mg by mouth once daily Encouraged regular physical activity for general health benefits Recommend home blood pressure and heart rate monitoring, to bring results in next visit Discussed need for medication compliance  Anxiety/Depression: Uncontrolled per patient  Current medication: bupropion XL 150 mg by mouth daily (recently discontinued citalopram and buspirone) Patient reports not being able to take middle of the day dose of buspirone and dose not feel like he is optimally managed.  Patient reports tolerating bupropion well but that he is still irritable. Plan to titrate bupropion to 300 mg daily after 4 weeks on lower dose.   Patient Goals/Self-Care Activities Over the next 90 days, patient will:  take medications as prescribed check glucose twice daily (fasting and 2 hours after dinner), document, and provide at future  appointments check blood pressure at least once daily, document, and provide at future appointments weigh daily, and contact provider if weight gain of 3 lbs in a day or more than 5 lbs in a week  Follow Up Plan: Face to Face appointment with care management team member scheduled for:  02/03/21      Medication Assistance: Application for Rybelsus and Jardiance medication assistance programs in process. See plan of care for additional detail.  Patient's preferred pharmacy is:  [facility] [address] [place] [address] Phone: [phone] Fax: [facility] - [place], [place] [address] Phone: [phone] Fax: [phone]  Follow Up:  Patient agrees to Care Plan and Follow-up.  Plan: Telephone follow up appointment with care management team member scheduled for:  02/03/21  Kennon Holter, PharmD Clinical Pharmacist [facility] [phone]

## 2021-01-25 NOTE — Patient Instructions (Signed)
Reginald [name],  It was great to talk to you today! Please call me and let me know when you receive determination of Medicare Extra Help in the mail. We will need to fax [employer] a copy of the letter if you are denied.  Please call me with any questions or concerns.   Visit Information  PATIENT GOALS:  Goals Addressed             This Visit's Progress    Medication Management       Patient Goals/Self-Care Activities Over the next 90 days, patient will:  take medications as prescribed check glucose twice daily (fasting and 2 hours after dinner), document, and provide at future appointments check blood pressure at least once daily, document, and provide at future appointments weigh daily, and contact provider if weight gain of 3 lbs in a day or more than 5 lbs in a week           Patient verbalizes understanding of instructions provided today and agrees to view in [place].   Telephone follow up appointment with care management team member scheduled for:02/03/21  Kennon Holter, PharmD Clinical Pharmacist [facility] [phone]

## 2021-01-26 ENCOUNTER — Other Ambulatory Visit: Payer: Self-pay | Admitting: Student

## 2021-01-26 NOTE — Telephone Encounter (Signed)
This is a [place] pt.  °

## 2021-01-27 ENCOUNTER — Other Ambulatory Visit: Payer: Self-pay

## 2021-01-27 ENCOUNTER — Telehealth: Payer: Self-pay | Admitting: Podiatry

## 2021-01-27 ENCOUNTER — Ambulatory Visit (INDEPENDENT_AMBULATORY_CARE_PROVIDER_SITE_OTHER): Payer: Medicare HMO | Admitting: Internal Medicine

## 2021-01-27 ENCOUNTER — Encounter: Payer: Self-pay | Admitting: Internal Medicine

## 2021-01-27 ENCOUNTER — Telehealth: Payer: Self-pay

## 2021-01-27 DIAGNOSIS — J449 Chronic obstructive pulmonary disease, unspecified: Secondary | ICD-10-CM | POA: Diagnosis not present

## 2021-01-27 DIAGNOSIS — J029 Acute pharyngitis, unspecified: Secondary | ICD-10-CM | POA: Diagnosis not present

## 2021-01-27 LAB — SARS-COV-2, NAA 2 DAY TAT

## 2021-01-27 LAB — NOVEL CORONAVIRUS, NAA: SARS-CoV-2, NAA: NOT DETECTED

## 2021-01-27 MED ORDER — BENZONATATE 100 MG PO CAPS: 100.0000 mg | ORAL_CAPSULE | Freq: Three times a day (TID) | ORAL | 0 refills | Status: DC | PRN

## 2021-01-27 MED ORDER — AZITHROMYCIN 250 MG PO TABS: ORAL_TABLET | ORAL | 0 refills | Status: AC

## 2021-01-27 NOTE — Assessment & Plan Note (Signed)
Uses Albuterol PRN Stable for now, not on any maintenance inhaler

## 2021-01-27 NOTE — Telephone Encounter (Signed)
Pt left message yesterday stating he had received a bill for the diabetic shoes/inserts and was told by the office they cover 1 pr per year. Why was he lied too about this?

## 2021-01-27 NOTE — Telephone Encounter (Signed)
Returned patients call after discussing with billing in our office and told pt she said that this has been happening with [employer] and pt needs to call [employer] and talk to tell them he has not received any diabetic inserts from any other office this year and make sure they check to see if it had been billed from somewhere else. If so tell them he had not received any from any other office. I explained multiple times and pt states he talked with [place] and [employer] talked with [place] and he was told they were getting it corrected. I told pt since he has talked to [place] and [employer] to maybe wait to see if they are correcting it and if he gets another bill to call [employer] back because it is a glitch in there system. He said he asked if he would have to pay when he got them and was told he would not. I explained that medicare guidelines allow 1 pr shoes and 3 prs of inserts per calendar yr. He said he is not going to get anymore because he cannot afford to pay for them and they hurt his feet.

## 2021-01-27 NOTE — Progress Notes (Signed)
   Virtual Visit via Telephone Note   This visit type was conducted due to national recommendations for restrictions regarding the COVID-19 Pandemic (e.g. social distancing) in an effort to limit this patient's exposure and mitigate transmission in our community.  Due to his co-morbid illnesses, this patient is at least at moderate risk for complications without adequate follow up.  This format is felt to be most appropriate for this patient at this time.  The patient did not have access to video technology/had technical difficulties with video requiring transitioning to audio format only (telephone).  All issues noted in this document were discussed and addressed.  No physical exam could be performed with this format.  Evaluation Performed:  Follow-up visit  Date:  01/27/2021   ID:  Reginald [name], DOB 03/19/1971, MRN [id number]  Patient Location: Home Provider Location: Office/Clinic  Participants: Patient Location of Patient: Home Location of Provider: Telehealth Consent was obtain for visit to be over via telehealth. I verified that I am speaking with the correct person using two identifiers.  PCP:  Patel, Rutwik K, MD   Chief Complaint:  Cough and congestion  History of Present Illness:    Reginald [name] is a 50 y.o. male who has a televisit for c/o cough, nasal congestion, sore throat and sneezing for last 2-3 days. He denies any fever, chills, worsening dyspnea or palpitations. He had negative COVID test. He has noticed patches in the back of the mouth/throat. He reports some chest tightness when he has coughing.  The patient does have symptoms concerning for COVID-19 infection (fever, chills, cough, or new shortness of breath).   Past Medical, Surgical, Social History, Allergies, and Medications have been Reviewed.  Past Medical History:  Diagnosis Date   Anxiety    Aortic stenosis    Arthritis    Asthma    Back pain    Cellulitis of skin with lymphangitis    CHF  (congestive heart failure) (HCC)    Chronic diastolic congestive heart failure (HCC)    Chronic venous insufficiency    Constipation    COPD (chronic obstructive pulmonary disease) (HCC)    Depression    Diabetes (HCC)    Dyspnea    Essential hypertension    Gout    Heart murmur    Hypertension    Morbid obesity (HCC)    Obesity    Pneumonia    walking pneumonia   Prostatitis    Pulmonary embolism (HCC)    Pulmonary hypertension (HCC)    Pyelonephritis    S/P aortic valve replacement with bioprosthetic valve 08/23/2017   25 mm Edwards Inspiris Resilia stented bovine pericardial tissue valve   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   24 mm Hemashield supracoronary straight graft    Sleep apnea    cpap    Thoracic ascending aortic aneurysm (HCC)    Thoracic ascending aortic aneurysm (HCC)    Tobacco abuse    Past Surgical History:  Procedure Laterality Date   AORTIC VALVE REPLACEMENT N/A 08/23/2017   Procedure: AORTIC VALVE REPLACEMENT (AVR) USING INSPIRIS RESILIA AORTIC VALVE SIZE 25 MM;  Surgeon: Owen, Clarence H, MD;  Location: [facility] OR;  Service: Open Heart Surgery;  Laterality: N/A;   CARDIAC VALVE REPLACEMENT N/A    [employer] 02/07/2020   COLONOSCOPY WITH PROPOFOL N/A 11/14/2020   Procedure: COLONOSCOPY WITH PROPOFOL;  Surgeon: Gessner, Carl E, MD;  Location: [facility];  Service: Endoscopy;  Laterality: N/A;   ESOPHAGOGASTRODUODENOSCOPY (EGD) WITH PROPOFOL N/A   11/14/2020   Procedure: ESOPHAGOGASTRODUODENOSCOPY (EGD) WITH PROPOFOL;  Surgeon: Gessner, Carl E, MD;  Location: [facility];  Service: Endoscopy;  Laterality: N/A;   MULTIPLE EXTRACTIONS WITH ALVEOLOPLASTY N/A 07/23/2017   Procedure: Extraction of tooth #10 with alveoloplasty and gross debridement of remaining teeth;  Surgeon: Kulinski, Ronald F, DDS;  Location: [facility] OR;  Service: Oral Surgery;  Laterality: N/A;   PACEMAKER IMPLANT N/A 08/28/2017    [employer] Jude [employer] Assurity MRI conditional  dual-chamber  pacemaker for symptomatic complete heart blockby Dr Allred   TEE WITHOUT CARDIOVERSION N/A 07/16/2017   Procedure: TRANSESOPHAGEAL ECHOCARDIOGRAM (TEE);  Surgeon: Crenshaw, Brian S, MD;  Location: [facility];  Service: Cardiovascular;  Laterality: N/A;   TEE WITHOUT CARDIOVERSION N/A 08/23/2017   Procedure: TRANSESOPHAGEAL ECHOCARDIOGRAM (TEE);  Surgeon: Owen, Clarence H, MD;  Location: [facility];  Service: Open Heart Surgery;  Laterality: N/A;   THORACIC AORTIC ANEURYSM REPAIR N/A 08/23/2017   Procedure: THORACIC ASCENDING ANEURYSM REPAIR (AAA) USING HEMASHIELD GOLD KNITTED MICROVEL DOUBLE VELOUR VASCULAR GRAFT D: 24 MM  L: 30 CM;  Surgeon: Owen, Clarence H, MD;  Location: [facility];  Service: Open Heart Surgery;  Laterality: N/A;     Current Meds  Medication Sig   acetaminophen (TYLENOL) 325 MG tablet Take 650 mg by mouth every 6 (six) hours as needed for mild pain.   albuterol (PROVENTIL) (2.5 MG/3ML) 0.083% nebulizer solution INHALE 1 VIAL VIA NEBULIZER EVERY 6 HOURS AS NEEDED FOR WHEEZING OR SHORTNESS OF BREATH   albuterol (VENTOLIN HFA) 108 (90 Base) MCG/ACT inhaler INHALE 2 PUFFS INTO THE LUNGS EVERY 6 (SIX) HOURS AS NEEDED FOR WHEEZING OR SHORTNESS OF BREATH.   allopurinol (ZYLOPRIM) 300 MG tablet TAKE 1 TABLET EVERY DAY   ammonium lactate (AMLACTIN) 12 % lotion Apply 1 application topically as needed for dry skin.   atorvastatin (LIPITOR) 40 MG tablet TAKE 1 TABLET EVERY DAY   azithromycin (ZITHROMAX) 250 MG tablet Take 2 tablets on day 1, then 1 tablet daily on days 2 through 5   benzonatate (TESSALON) 100 MG capsule Take 1 capsule (100 mg total) by mouth 3 (three) times daily as needed for cough.   Blood Glucose Monitoring Suppl (TRUE METRIX METER) w/Device KIT USE AS DIRECTED   buPROPion (WELLBUTRIN XL) 150 MG 24 hr tablet Take 1 tablet (150 mg total) by mouth daily.   [START ON 02/17/2021] buPROPion (WELLBUTRIN XL) 300 MG 24 hr tablet Take 1 tablet (300 mg total) by mouth daily.    diclofenac Sodium (VOLTAREN) 1 % GEL Apply 2 g topically 4 (four) times daily.   docusate sodium (COLACE) 100 MG capsule Take 2 capsules (200 mg total) by mouth 2 (two) times daily.   empagliflozin (JARDIANCE) 10 MG TABS tablet Take 1 tablet (10 mg total) by mouth daily before breakfast.   FEROSUL 325 (65 Fe) MG tablet TAKE 1 TABLET EVERY DAY   fexofenadine (ALLEGRA) 180 MG tablet Take 180 mg by mouth daily.   gabapentin (NEURONTIN) 100 MG capsule Take 1 capsule in the morning and 2 capsules in the evening.   Krill Oil 350 MG CAPS Take 1 capsule by mouth in the morning and at bedtime.    lisinopril (ZESTRIL) 20 MG tablet Take 1 tablet (20 mg total) by mouth daily.   loratadine (CLARITIN) 10 MG tablet Take 10 mg by mouth daily as needed for allergies.   metFORMIN (GLUCOPHAGE) 850 MG tablet Take 1 tablet (850 mg total) by mouth 2 (two) times daily with a meal.     metolazone (ZAROXOLYN) 5 MG tablet TAKE 1 TABLET (5 MG TOTAL) BY MOUTH THREE TIMES A WEEK.   metoprolol tartrate (LOPRESSOR) 100 MG tablet TAKE 1 TABLET TWICE DAILY   pantoprazole (PROTONIX) 40 MG tablet Take 1 tablet (40 mg total) by mouth daily.   polyethylene glycol (MIRALAX) 17 g packet Take 17 g by mouth daily as needed.   potassium chloride SA (KLOR-CON) 20 MEQ tablet TAKE 3 TABS DAILY AND TAKE AN ADDITIONAL 1 TAB ON THE DAY YOU TAKE YOUR WEEKLY METOLAZONE DOSE   rivaroxaban (XARELTO) 20 MG TABS tablet TAKE 1 Tablet BY MOUTH ONCE EVERY DAY WITH SUPPER   Semaglutide (RYBELSUS) 14 MG TABS Take 14 mg by mouth in the morning.   terbinafine (LAMISIL) 250 MG tablet Take 1 tablet by mouth once daily   torsemide (DEMADEX) 20 MG tablet Take 40 mg in the AM and Take 20 mg in the PM   TRUE METRIX BLOOD GLUCOSE TEST test strip TEST ONE TIME DAILY FOR DIABETES   TRUEplus Lancets 33G MISC USE TO TEST ONE TIME DAILY FOR DIABETES     Allergies:   Patient has no known allergies.   ROS:   Please see the history of present illness.     All other  systems reviewed and are negative.   Labs/Other Tests and Data Reviewed:    Recent Labs: 10/04/2020: TSH 4.060 11/15/2020: Magnesium 2.2 11/24/2020: ALT 27 12/20/2020: B Natriuretic Peptide 62.0 01/05/2021: BUN 24; Creatinine, Ser 1.18; Hemoglobin 10.5; Platelets 261; Potassium 3.7; Sodium 137   Recent Lipid Panel Lab Results  Component Value Date/Time   CHOL 123 06/10/2020 08:37 AM   TRIG 281 (H) 06/10/2020 08:37 AM   HDL 25 (L) 06/10/2020 08:37 AM   CHOLHDL 4.9 06/10/2020 08:37 AM   CHOLHDL 6.6 01/01/2020 10:14 AM   LDLCALC 54 06/10/2020 08:37 AM    Wt Readings from Last 3 Encounters:  01/21/21 (!) 476 lb (215.9 kg)  01/12/21 (!) 471 lb (213.6 kg)  11/26/20 (!) 463 lb 12.8 oz (210.4 kg)     ASSESSMENT & PLAN:    Acute pharyngitis Started Azithromycin as persistent symptoms COVID test negative Sore throat a main complaint, likely bacterial pharyngitis Tessalon PRN for cough  COPD (chronic obstructive pulmonary disease) (HCC) Uses Albuterol PRN Stable for now, not on any maintenance inhaler   Time:   Today, I have spent 13 minutes reviewing the chart, including problem list, medications, and with the patient with telehealth technology discussing the above problems.   Medication Adjustments/Labs and Tests Ordered: Current medicines are reviewed at length with the patient today.  Concerns regarding medicines are outlined above.   Tests Ordered: No orders of the defined types were placed in this encounter.   Medication Changes: Meds ordered this encounter  Medications   azithromycin (ZITHROMAX) 250 MG tablet    Sig: Take 2 tablets on day 1, then 1 tablet daily on days 2 through 5    Dispense:  6 tablet    Refill:  0   benzonatate (TESSALON) 100 MG capsule    Sig: Take 1 capsule (100 mg total) by mouth 3 (three) times daily as needed for cough.    Dispense:  20 capsule    Refill:  0     Note: This dictation was prepared with Dragon dictation along with smaller  phrase technology. Similar sounding words can be transcribed inadequately or may not be corrected upon review. Any transcriptional errors that result from this process are unintentional.        Disposition:  Follow up  Signed, [name], MD  01/27/2021 10:32 AM     [place]

## 2021-01-27 NOTE — Telephone Encounter (Signed)
error 

## 2021-01-30 ENCOUNTER — Emergency Department (HOSPITAL_COMMUNITY): Payer: Medicare HMO

## 2021-01-30 ENCOUNTER — Other Ambulatory Visit: Payer: Self-pay

## 2021-01-30 ENCOUNTER — Emergency Department (HOSPITAL_COMMUNITY)
Admission: EM | Admit: 2021-01-30 | Discharge: 2021-01-30 | Disposition: A | Discharge status: Home / Self Care | Payer: Medicare HMO | Attending: Emergency Medicine | Admitting: Emergency Medicine

## 2021-01-30 ENCOUNTER — Encounter (HOSPITAL_COMMUNITY): Payer: Self-pay | Admitting: Emergency Medicine

## 2021-01-30 DIAGNOSIS — Z7901 Long term (current) use of anticoagulants: Secondary | ICD-10-CM | POA: Insufficient documentation

## 2021-01-30 DIAGNOSIS — I4891 Unspecified atrial fibrillation: Secondary | ICD-10-CM | POA: Diagnosis not present

## 2021-01-30 DIAGNOSIS — Z20822 Contact with and (suspected) exposure to covid-19: Secondary | ICD-10-CM | POA: Insufficient documentation

## 2021-01-30 DIAGNOSIS — N179 Acute kidney failure, unspecified: Secondary | ICD-10-CM | POA: Diagnosis not present

## 2021-01-30 DIAGNOSIS — I5032 Chronic diastolic (congestive) heart failure: Secondary | ICD-10-CM | POA: Diagnosis not present

## 2021-01-30 DIAGNOSIS — J449 Chronic obstructive pulmonary disease, unspecified: Secondary | ICD-10-CM | POA: Insufficient documentation

## 2021-01-30 DIAGNOSIS — Z79899 Other long term (current) drug therapy: Secondary | ICD-10-CM | POA: Insufficient documentation

## 2021-01-30 DIAGNOSIS — J811 Chronic pulmonary edema: Secondary | ICD-10-CM | POA: Diagnosis not present

## 2021-01-30 DIAGNOSIS — J45909 Unspecified asthma, uncomplicated: Secondary | ICD-10-CM | POA: Diagnosis not present

## 2021-01-30 DIAGNOSIS — R079 Chest pain, unspecified: Secondary | ICD-10-CM | POA: Diagnosis not present

## 2021-01-30 DIAGNOSIS — Z7984 Long term (current) use of oral hypoglycemic drugs: Secondary | ICD-10-CM | POA: Diagnosis not present

## 2021-01-30 DIAGNOSIS — D72829 Elevated white blood cell count, unspecified: Secondary | ICD-10-CM | POA: Diagnosis not present

## 2021-01-30 DIAGNOSIS — I517 Cardiomegaly: Secondary | ICD-10-CM | POA: Diagnosis not present

## 2021-01-30 DIAGNOSIS — R0602 Shortness of breath: Secondary | ICD-10-CM | POA: Diagnosis not present

## 2021-01-30 DIAGNOSIS — Z87891 Personal history of nicotine dependence: Secondary | ICD-10-CM | POA: Insufficient documentation

## 2021-01-30 DIAGNOSIS — I110 Hypertensive heart disease with heart failure: Secondary | ICD-10-CM | POA: Diagnosis not present

## 2021-01-30 DIAGNOSIS — E871 Hypo-osmolality and hyponatremia: Secondary | ICD-10-CM | POA: Diagnosis not present

## 2021-01-30 LAB — URINALYSIS, ROUTINE W REFLEX MICROSCOPIC
Bilirubin Urine: NEGATIVE
Glucose, UA: >=500 mg/dL — AB
Hgb urine dipstick: NEGATIVE
Ketones, ur: NEGATIVE mg/dL
Leukocytes,Ua: NEGATIVE
Nitrite: NEGATIVE
Protein, ur: NEGATIVE mg/dL
Specific Gravity, Urine: 1.015 (ref 1.005–1.030)
pH: 6 (ref 5.0–8.0)

## 2021-01-30 LAB — RESP PANEL BY RT-PCR (FLU A&B, COVID) ARPGX2
Influenza A by PCR: NEGATIVE
Influenza B by PCR: NEGATIVE
SARS Coronavirus 2 by RT PCR: NEGATIVE

## 2021-01-30 LAB — BASIC METABOLIC PANEL
Anion gap: 12 (ref 5–15)
BUN: 48 mg/dL — ABNORMAL HIGH (ref 6–20)
CO2: 29 mmol/L (ref 22–32)
Calcium: 8.8 mg/dL — ABNORMAL LOW (ref 8.9–10.3)
Chloride: 93 mmol/L — ABNORMAL LOW (ref 98–111)
Creatinine, Ser: 2.13 mg/dL — ABNORMAL HIGH (ref 0.61–1.24)
GFR, Estimated: 37 mL/min — ABNORMAL LOW (ref >60)
Glucose, Bld: 215 mg/dL — ABNORMAL HIGH (ref 70–99)
Potassium: 3.7 mmol/L (ref 3.5–5.1)
Sodium: 134 mmol/L — ABNORMAL LOW (ref 135–145)

## 2021-01-30 LAB — CBC
HCT: 37.8 % — ABNORMAL LOW (ref 39.0–52.0)
Hemoglobin: 11.4 g/dL — ABNORMAL LOW (ref 13.0–17.0)
MCH: 25.9 pg — ABNORMAL LOW (ref 26.0–34.0)
MCHC: 30.2 g/dL (ref 30.0–36.0)
MCV: 85.7 fL (ref 80.0–100.0)
Platelets: 302 10*3/uL (ref 150–400)
RBC: 4.41 MIL/uL (ref 4.22–5.81)
RDW: 19.9 % — ABNORMAL HIGH (ref 11.5–15.5)
WBC: 12.5 10*3/uL — ABNORMAL HIGH (ref 4.0–10.5)
nRBC: 0 % (ref 0.0–0.2)

## 2021-01-30 LAB — URINALYSIS, MICROSCOPIC (REFLEX)

## 2021-01-30 LAB — TROPONIN I (HIGH SENSITIVITY)
Troponin I (High Sensitivity): 7 ng/L (ref <18)
Troponin I (High Sensitivity): 8 ng/L (ref <18)

## 2021-01-30 LAB — PROTIME-INR
INR: 1.2 (ref 0.8–1.2)
Prothrombin Time: 15.2 seconds (ref 11.4–15.2)

## 2021-01-30 LAB — BRAIN NATRIURETIC PEPTIDE: B Natriuretic Peptide: 67 pg/mL (ref 0.0–100.0)

## 2021-01-30 MED ORDER — ACETAMINOPHEN 325 MG PO TABS
650.0000 mg | ORAL_TABLET | Freq: Once | ORAL | Status: AC
  Administered 2021-01-30: 650 mg via ORAL
  Filled 2021-01-30: qty 2

## 2021-01-30 MED ORDER — SODIUM CHLORIDE 0.9 % IV BOLUS
1000.0000 mL | Freq: Once | INTRAVENOUS | Status: DC
  Administered 2021-01-30: 1000 mL via INTRAVENOUS

## 2021-01-30 MED ORDER — SODIUM CHLORIDE 0.9 % IV BOLUS
500.0000 mL | Freq: Once | INTRAVENOUS | Status: AC
  Administered 2021-01-30: 500 mL via INTRAVENOUS

## 2021-01-30 NOTE — Discharge Instructions (Addendum)
Your lab work shows that you have a acute kidney injury I suspect this is from the torsemide that you are taking I want to discontinue take this medication as this will further worsen your kidney function.  I also want to stop taking your Jardiance and your lisinopril.  You must hydrate please drink plenty fluids this will help with your kidney function.  You must call your primary care doctor tomorrow to determine what medications you should be taking and you will need to follow-up with them to recheck your kidney function.  Come back to the emergency department if you develop chest pain, shortness of breath, severe abdominal pain, uncontrolled nausea, vomiting, diarrhea.

## 2021-01-30 NOTE — ED Provider Notes (Signed)
[facility] EMERGENCY DEPARTMENT Provider Note   CSN: [id number] Arrival date & time: 01/30/21  1612     History Chief Complaint  Patient presents with   Hypotension    Reginald [name] is a 50 y.o. male.  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 fib currently on Xarelto hypertension, obesity PE thoracic ascending aneurysm presents to the emergency department with chief complaint of hypotension.  Patient states today [place] flea [place] he he took his blood pressure noted that his blood pressure was low in the 70s so he came here for further evaluation.  He states that he did not have chest pain, worsening shortness of breath, lightheaded or dizziness.  He does note that over the last couple days he has had a productive cough and has felt more short of breath, worse when he lays back versus laying forward, states he coughs up some phlegm denies worsening pedal edema, denies systemic infection fevers or chills, denies recent sick contacts, is up-to-date on his COVID-vaccine.  He denies stomach pain, nausea, vomiting, diarrhea.  He does note that he has felt short of breath when he lays down so he took an extra dose of his Lasix which did not seem to help with his problem.  He has no other complaints this time.   Past Medical History:  Diagnosis Date   Anxiety    Aortic stenosis    Arthritis    Asthma    Back pain    Cellulitis of skin with lymphangitis    CHF (congestive heart failure) (HCC)    Chronic diastolic congestive heart failure (HCC)    Chronic venous insufficiency    Constipation    COPD (chronic obstructive pulmonary disease) (HCC)    Depression    Diabetes ([place])    Dyspnea    Essential hypertension    Gout    Heart murmur    Hypertension    Morbid obesity ([place])    Obesity    Pneumonia    walking pneumonia   Prostatitis    Pulmonary embolism ([place])    Pulmonary hypertension (HCC)     Pyelonephritis    S/P aortic valve replacement with bioprosthetic valve 08/23/2017   25 mm Edwards Inspiris Resilia stented bovine pericardial tissue valve   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   24 mm Hemashield supracoronary straight graft    Sleep apnea    cpap    Thoracic ascending aortic aneurysm [facility])    Thoracic ascending aortic aneurysm (HCC)    Tobacco abuse     Patient Active Problem List   Diagnosis Date Noted   Rectal bleeding    Chronic blood loss anemia 11/12/2020   Gastrointestinal hemorrhage 10/08/2020   S/P placement of cardiac pacemaker 02/10/2020   Encounter for examination following treatment at hospital 02/10/2020   Anxiety 02/10/2020   DM2 (diabetes mellitus, type 2) ([place]) 02/10/2020   OSA on CPAP    Abnormal liver function 03/16/2019   History of pulmonary embolism 03/16/2019   Paroxysmal atrial fibrillation ([place]) 09/05/2018   COPD (chronic obstructive pulmonary disease) ([place]) 05/07/2018   Gastroesophageal reflux disease    Pulmonary embolism ([place]) 09/18/2017   Encounter for therapeutic drug monitoring 09/04/2017   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 08/23/2017   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   Pulmonary hypertension (HCC)    Chronic periodontitis 07/18/2017   Morbid  obesity ([place])    Essential hypertension    Tobacco abuse    Chronic heart failure with preserved ejection fraction (HFpEF) ([place])    Chronic venous insufficiency     Past Surgical History:  Procedure Laterality Date   AORTIC VALVE REPLACEMENT N/A 08/23/2017   Procedure: AORTIC VALVE REPLACEMENT (AVR) USING INSPIRIS RESILIA AORTIC VALVE SIZE 25 MM;  Surgeon: Rexene Alberts, MD;  Location: [place];  Service: Open Heart Surgery;  Laterality: N/A;   CARDIAC VALVE REPLACEMENT N/A    Phreesia 02/07/2020   COLONOSCOPY WITH PROPOFOL N/A 11/14/2020   Procedure: COLONOSCOPY WITH PROPOFOL;  Surgeon: Gatha Mayer, MD;  Location[facility] ENDOSCOPY;  Service: Endoscopy;  Laterality: N/A;   ESOPHAGOGASTRODUODENOSCOPY (EGD) WITH PROPOFOL N/A 11/14/2020   Procedure: ESOPHAGOGASTRODUODENOSCOPY (EGD) WITH PROPOFOL;  Surgeon: Gatha Mayer, MD;  Location: [place];  Service: Endoscopy;  Laterality: N/A;   MULTIPLE EXTRACTIONS WITH ALVEOLOPLASTY N/A 07/23/2017   Procedure: Extraction of tooth #10 with alveoloplasty and gross debridement of remaining teeth;  Surgeon: Lenn Cal, DDS;  Location: [place];  Service: Oral Surgery;  Laterality: N/A;   PACEMAKER IMPLANT N/A 08/28/2017    St Jude Medical Assurity MRI conditional  dual-chamber pacemaker for symptomatic complete heart blockby Dr Rayann Heman   TEE WITHOUT CARDIOVERSION N/A 07/16/2017   Procedure: TRANSESOPHAGEAL ECHOCARDIOGRAM (TEE);  Surgeon: Lelon Perla, MD;  Location: [facility] ENDOSCOPY;  Service: Cardiovascular;  Laterality: N/A;   TEE WITHOUT CARDIOVERSION N/A 08/23/2017   Procedure: TRANSESOPHAGEAL ECHOCARDIOGRAM (TEE);  Surgeon: Rexene Alberts, MD;  Location: [place];  Service: Open Heart Surgery;  Laterality: N/A;   THORACIC AORTIC ANEURYSM REPAIR N/A 08/23/2017   Procedure: THORACIC ASCENDING ANEURYSM REPAIR (AAA) USING HEMASHIELD GOLD KNITTED MICROVEL DOUBLE VELOUR VASCULAR GRAFT D: 24 MM  L: 30 CM;  Surgeon: Rexene Alberts, MD;  Location: [place];  Service: Open Heart Surgery;  Laterality: N/A;       Family History  Problem Relation Age of Onset   Hypertension Mother    Alzheimer's disease Mother    Heart attack Mother    Heart attack Brother    Diabetes Brother     Social History   Tobacco Use   Smoking status: Former    Packs/day: 2.00    Years: 16.00    Pack years: 32.00    Types: Cigarettes    Quit date: 08/23/2017    Years since quitting: 3.4   Smokeless tobacco: Never  Vaping Use   Vaping Use: Never used  Substance Use Topics   Alcohol use: No    Comment: have had alcohol in the past, not heavy   Drug use: No     Home Medications Prior to Admission medications   Medication Sig Start Date End Date Taking? Authorizing Provider  acetaminophen (TYLENOL) 325 MG tablet Take 650 mg by mouth every 6 (six) hours as needed for mild pain.    [provider]  albuterol (PROVENTIL) (2.5 MG/3ML) 0.083% nebulizer solution INHALE 1 VIAL VIA NEBULIZER EVERY 6 HOURS AS NEEDED FOR WHEEZING OR SHORTNESS OF BREATH 08/05/20   [name], MD  albuterol (VENTOLIN HFA) 108 (90 Base) MCG/ACT inhaler INHALE 2 PUFFS INTO THE LUNGS EVERY 6 (SIX) HOURS AS NEEDED FOR WHEEZING OR SHORTNESS OF BREATH. 12/10/20   [name], MD  allopurinol (ZYLOPRIM) 300 MG tablet TAKE 1 TABLET EVERY DAY 11/11/20   [name], MD  ammonium lactate (AMLACTIN) 12 % lotion Apply 1 application topically as  needed for dry skin. 10/13/20   Felipa Furnace, DPM  atorvastatin (LIPITOR) 40 MG tablet TAKE 1 TABLET EVERY DAY 09/20/20   [name], MD  azithromycin (ZITHROMAX) 250 MG tablet Take 2 tablets on day 1, then 1 tablet daily on days 2 through 5 01/27/21 02/01/21  [name], MD  benzonatate (TESSALON) 100 MG capsule Take 1 capsule (100 mg total) by mouth 3 (three) times daily as needed for cough. 01/27/21   [name], MD  Blood Glucose Monitoring Suppl (TRUE METRIX METER) w/Device KIT USE AS DIRECTED 08/19/20   [name], MD  buPROPion (WELLBUTRIN XL) 150 MG 24 hr tablet Take 1 tablet (150 mg total) by mouth daily. 01/18/21   [name], MD  buPROPion (WELLBUTRIN XL) 300 MG 24 hr tablet Take 1 tablet (300 mg total) by mouth daily. 02/17/21   [name], MD  diclofenac Sodium (VOLTAREN) 1 % GEL Apply 2 g topically 4 (four) times daily. 10/26/20   Mordecai Rasmussen, MD  docusate sodium (COLACE) 100 MG capsule Take 2 capsules (200 mg total) by mouth 2 (two) times daily. 11/15/20   Thurnell Lose, MD  empagliflozin (JARDIANCE) 10 MG TABS tablet Take 1 tablet (10 mg total) by mouth daily before breakfast. 01/12/21    Ahmed Prima, Fransisco Hertz, PA-C  FEROSUL 325 (65 Fe) MG tablet TAKE 1 TABLET EVERY DAY 12/23/20   [name], MD  fexofenadine (ALLEGRA) 180 MG tablet Take 180 mg by mouth daily.    [provider]  gabapentin (NEURONTIN) 100 MG capsule Take 1 capsule in the morning and 2 capsules in the evening. 11/15/20   Thurnell Lose, MD  glipiZIDE (GLUCOTROL XL) 5 MG 24 hr tablet Take 5 mg by mouth daily. 01/22/21   [provider]  Astrid Drafts 350 MG CAPS Take 1 capsule by mouth in the morning and at bedtime.     [provider]  lisinopril (ZESTRIL) 20 MG tablet Take 1 tablet (20 mg total) by mouth daily. 04/21/20   Strader, Fransisco Hertz, PA-C  loratadine (CLARITIN) 10 MG tablet Take 10 mg by mouth daily as needed for allergies.    [provider]  metFORMIN (GLUCOPHAGE) 850 MG tablet Take 1 tablet (850 mg total) by mouth 2 (two) times daily with a meal. 10/08/20   Patel, Colin Broach, MD  metolazone (ZAROXOLYN) 5 MG tablet TAKE 1 TABLET (5 MG TOTAL) BY MOUTH THREE TIMES A WEEK. 01/26/21   Allred, Jeneen Rinks, MD  metoprolol tartrate (LOPRESSOR) 100 MG tablet TAKE 1 TABLET TWICE DAILY 12/27/20   Josue Hector, MD  pantoprazole (PROTONIX) 40 MG tablet Take 1 tablet (40 mg total) by mouth daily. 07/14/20   [name], MD  polyethylene glycol (MIRALAX) 17 g packet Take 17 g by mouth daily as needed. 11/15/20   Thurnell Lose, MD  potassium chloride SA (KLOR-CON) 20 MEQ tablet TAKE 3 TABS DAILY AND TAKE AN ADDITIONAL 1 TAB ON THE DAY YOU TAKE YOUR WEEKLY METOLAZONE DOSE 12/22/20   Ahmed Prima, Fransisco Hertz, PA-C  rivaroxaban (XARELTO) 20 MG TABS tablet TAKE 1 Tablet BY MOUTH ONCE EVERY DAY WITH SUPPER 10/20/20   Josue Hector, MD  Semaglutide (RYBELSUS) 14 MG TABS Take 14 mg by mouth in the morning.    [provider]  terbinafine (LAMISIL) 250 MG tablet Take 1 tablet by mouth once daily 01/25/21   [name], MD  torsemide (DEMADEX) 20 MG tablet  Take 40 mg in the AM and Take 20 mg in  the PM 12/21/20   Strader, [place] M, PA-C  TRUE METRIX BLOOD GLUCOSE TEST test strip TEST ONE TIME DAILY FOR DIABETES 10/25/20   [name], MD  TRUEplus Lancets 33G MISC USE TO TEST ONE TIME DAILY FOR DIABETES 10/25/20   [name], MD    Allergies    Patient has no known allergies.  Review of Systems   Review of Systems  Constitutional:  Negative for chills and fever.  HENT:  Negative for congestion.   Respiratory:  Positive for cough and shortness of breath.   Cardiovascular:  Positive for chest pain.  Gastrointestinal:  Negative for abdominal pain, diarrhea, nausea and vomiting.  Genitourinary:  Negative for enuresis.  Musculoskeletal:  Negative for back pain.  Skin:  Negative for rash.  Neurological:  Negative for dizziness and headaches.  Hematological:  Does not bruise/bleed easily.   Physical Exam Updated Vital Signs BP 113/76   Pulse 75   Temp 98.1 F (36.7 C) (Oral)   Resp 20   Ht _0  (1.803 m)   Wt (!) 213.2 kg   SpO2 98%   BMI 65.55 kg/m   Physical Exam Vitals and nursing note reviewed.  Constitutional:      General: He is not in acute distress.    Appearance: He is not ill-appearing.  HENT:     Head: Normocephalic and atraumatic.     Nose: No congestion.  Eyes:     Conjunctiva/sclera: Conjunctivae normal.  Cardiovascular:     Rate and Rhythm: Normal rate and regular rhythm.     Pulses: Normal pulses.     Heart sounds: No murmur heard.   No friction rub. No gallop.  Pulmonary:     Effort: No respiratory distress.     Breath sounds: No wheezing, rhonchi or rales.  Abdominal:     Palpations: Abdomen is soft.     Tenderness: There is no abdominal tenderness. There is no right CVA tenderness or left CVA tenderness.  Musculoskeletal:     Right lower leg: No edema.     Left lower leg: No edema.  Skin:    General: Skin is warm and dry.  Neurological:     Mental Status: He is alert.  Psychiatric:        Mood and Affect: Mood normal.    ED  Results / Procedures / Treatments   Labs (all labs ordered are listed, but only abnormal results are displayed) Labs Reviewed  BASIC METABOLIC PANEL - Abnormal; Notable for the following components:      Result Value   Sodium 134 (*)    Chloride 93 (*)    Glucose, Bld 215 (*)    BUN 48 (*)    Creatinine, Ser 2.13 (*)    Calcium 8.8 (*)    GFR, Estimated 37 (*)    All other components within normal limits  CBC - Abnormal; Notable for the following components:   WBC 12.5 (*)    Hemoglobin 11.4 (*)    HCT 37.8 (*)    MCH 25.9 (*)    RDW 19.9 (*)    All other components within normal limits  URINALYSIS, ROUTINE W REFLEX MICROSCOPIC - Abnormal; Notable for the following components:   Glucose, UA >=500 (*)    All other components within normal limits  URINALYSIS, MICROSCOPIC (REFLEX) - Abnormal; Notable for the following components:   Bacteria, UA RARE (*)  All other components within normal limits  RESP PANEL BY RT-PCR (FLU A&B, COVID) [id number]  PROTIME-INR  BRAIN NATRIURETIC PEPTIDE  TROPONIN I (HIGH SENSITIVITY)  TROPONIN I (HIGH SENSITIVITY)    EKG EKG Interpretation  Date/Time:  Sunday January 30 2021 16:22:32 EDT Ventricular Rate:  79 PR Interval:  192 QRS Duration: 162 QT Interval:  450 QTC Calculation: 516 R Axis:   158 Text Interpretation: Normal sinus rhythm Right bundle branch block Confirmed by Sherwood Gambler [id number]) on 01/30/2021 4:40:26 PM  Radiology DG Chest 2 View  Result Date: 01/30/2021 CLINICAL DATA:  Chest pain and shortness of breath. EXAM: CHEST - 2 VIEW COMPARISON:  Chest x-ray dated November 24, 2020. FINDINGS: Unchanged left chest wall pacemaker. Stable mild cardiomegaly and pulmonary vascular congestion. Prior AVR. No focal consolidation, pleural effusion, or pneumothorax. No acute osseous abnormality. IMPRESSION: 1. Unchanged mild cardiomegaly and pulmonary vascular congestion. Electronically Signed   By: Titus Dubin M.D.   On: 01/30/2021 17:20     Procedures Procedures   Medications Ordered in ED Medications  sodium chloride 0.9 % bolus 500 mL (500 mLs Intravenous Bolus 01/30/21 1855)  acetaminophen (TYLENOL) tablet 650 mg (650 mg Oral Given 01/30/21 2012)    ED Course  I have reviewed the triage vital signs and the nursing notes.  Pertinent labs & imaging results that were available during my care of the patient were reviewed by me and considered in my medical decision making (see chart for details).    MDM Rules/Calculators/A&P                          Initial impression-patient presents with concern of hypotension.  He is alert, does not appear in acute stress, vital signs reassuring.  Triage obtain basic lab work-up, will add on UA, BNP provide patient fluids and reassess.  Work-up-CBC shows slight leukocytosis of 12.5, normocytic anemia hemoglobin 11.4, BMP shows slight hyponatremia 134, hyperglycemia 215, BUN of 48, creatinine 2.13, calcium 8.8, GFR 37 first troponin is 7.  Second troponin is 8, BNP is 64 UA negative for nitrates, leukocytes, shows rare bacteria.  Chest x-ray reveals unchanged mild cardiomegaly and pulmonary vascular congestion.  EKG sinus without signs of ischemia.  Reassessment-patient is not AKI we will provide him with fluids and continue to monitor.  updated patient on lab work and imaging, patient states he would rather go home as he states he will hydrate at home and will discontinue his Lasix for next couple days.  I find this reasonable, states he will follow-up with his PCP for recheck of his creatinine level.  Rule out- I have low suspicion for ACS as history is atypical, patient has no cardiac history, EKG was sinus rhythm without signs of ischemia, patient had a delta troponin.  Low suspicion for PE as patient denies pleuritic chest pain, shortness of breath, patient denies leg pain, no pedal edema noted on exam, patient was PERC negative.  Low suspicion for CHF exacerbation as patient not appear  volume overloaded on exam, chest x-ray is unremarkable, BMP is reassuring.  Low suspicion for AAA or aortic dissection as history is atypical, patient has low risk factors.  Low suspicion for systemic infection as patient is nontoxic-appearing, vital signs reassuring, no obvious source infection noted on exam.  Low suspicion for kidney stone, UTI, [place] as patient denies any urinary symptoms, UA is negative for signs infection and/or hematuria.  I have low suspicion for postrenal obstruction as patient  states he is urine without difficulty.  I have low suspicion for pneumonia and/or COVID as x-ray is unremarkable for signs of infection, respiratory panel is negative for influenza and COVID.   Plan-  AKI-I suspect this is likely due to the extra dose of Lasix that he took, will have him discontinue his Lasix for the next 2 days have him hydrate and follow-up with his PCP next 2 to 3 days for repeat CMP.  We will also have him discontinue his Jardiance, lisinopril as this can cause further kidney damage. Will have him call his  PCP to determine what medications he should be on.   Cough-like this is a viral URI, he is currently on antibiotics at this time I find the regimen is appropriate will have him continue this.  Vital signs have remained stable, no indication for hospital admission.  Patient discussed with attending and they agreed with assessment and plan.  Patient given at home care as well strict return precautions.  Patient verbalized that they understood agreed to said plan.  Final Clinical Impression(s) / ED Diagnoses Final diagnoses:  AKI (acute kidney injury) [facility])    Rx / [place] Orders ED Discharge Orders     None        Aron Baba 01/30/21 2222    Sherwood Gambler, MD 01/31/21 1614

## 2021-01-30 NOTE — ED Triage Notes (Signed)
Pt states he took his BP twice today and systolic was in the [id number]. Pt also c/o mild CP and dyspnea especially with exertion.

## 2021-01-30 NOTE — ED Notes (Signed)
Pt placed on cardiac monitor with BP to set cycle every 30 minutes. Continuous pulse oximeter applied.  

## 2021-01-31 ENCOUNTER — Encounter: Payer: Self-pay | Admitting: Internal Medicine

## 2021-01-31 ENCOUNTER — Ambulatory Visit (INDEPENDENT_AMBULATORY_CARE_PROVIDER_SITE_OTHER): Payer: Medicare HMO | Admitting: Internal Medicine

## 2021-01-31 VITALS — Wt >= 6400 oz

## 2021-01-31 DIAGNOSIS — Z09 Encounter for follow-up examination after completed treatment for conditions other than malignant neoplasm: Secondary | ICD-10-CM | POA: Diagnosis not present

## 2021-01-31 DIAGNOSIS — N179 Acute kidney failure, unspecified: Secondary | ICD-10-CM | POA: Diagnosis not present

## 2021-01-31 DIAGNOSIS — J069 Acute upper respiratory infection, unspecified: Secondary | ICD-10-CM

## 2021-01-31 NOTE — Patient Instructions (Signed)
Please avoid taking Torsemide and Metolazone for another day.  Please maintain adequate hydration by taking about 50 ounces of fluid per day.  Please use Albuterol inhaler for shortness of breath.  Please follow low salt diet to help with leg swelling and keep legs elevated when possible at home.

## 2021-01-31 NOTE — Progress Notes (Signed)
Virtual Visit via Telephone Note   This visit type was conducted due to national recommendations for restrictions regarding the COVID-19 Pandemic (e.g. social distancing) in an effort to limit this patient's exposure and mitigate transmission in our community.  Due to his co-morbid illnesses, this patient is at least at moderate risk for complications without adequate follow up.  This format is felt to be most appropriate for this patient at this time.  The patient did not have access to video technology/had technical difficulties with video requiring transitioning to audio format only (telephone).  All issues noted in this document were discussed and addressed.  No physical exam could be performed with this format.  Evaluation Performed:  Follow-up visit  Date:  01/31/2021   ID:  Reginald [name], DOB 08-03-1970, MRN [id number]  Patient Location: Home Provider Location: Office/Clinic  Participants: Patient Location of Patient: Home Location of Provider: Telehealth Consent was obtain for visit to be over via telehealth. I verified that I am speaking with the correct person using two identifiers.  PCP:  [name], MD   Chief Complaint:  Dyspnea and recent ER visit  History of Present Illness:    Reginald [name] is a 50 y.o. male who has a televisit after recent ER visit last night for hypotension.  He was found to have AKI, and was given IV fluids for dehydration.  He was discharged from ER this morning and was told to avoid taking Lasix, metolazone, lisinopril and Jardiance for now.  He states that he has been feeling more short of breath and took 1 tablet of torsemide this morning and is feeling better now.  Of note, he took extra tablet of metolazone yesterday as he had LE swelling and shortness of breath.  He denies any fever, chills, nausea or vomiting.  The patient does not have symptoms concerning for COVID-19 infection (fever, chills, cough, or new shortness of breath).   Past  Medical, Surgical, Social History, Allergies, and Medications have been Reviewed.  Past Medical History:  Diagnosis Date   Anxiety    Aortic stenosis    Arthritis    Asthma    Back pain    Cellulitis of skin with lymphangitis    CHF (congestive heart failure) (HCC)    Chronic diastolic congestive heart failure (HCC)    Chronic venous insufficiency    Constipation    COPD (chronic obstructive pulmonary disease) (HCC)    Depression    Diabetes ([place])    Dyspnea    Essential hypertension    Gout    Heart murmur    Hypertension    Morbid obesity ([place])    Obesity    Pneumonia    walking pneumonia   Prostatitis    Pulmonary embolism ([place])    Pulmonary hypertension (HCC)    Pyelonephritis    S/P aortic valve replacement with bioprosthetic valve 08/23/2017   25 mm Edwards Inspiris Resilia stented bovine pericardial tissue valve   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   24 mm Hemashield supracoronary straight graft    Sleep apnea    cpap    Thoracic ascending aortic aneurysm (HCC)    Thoracic ascending aortic aneurysm (HCC)    Tobacco abuse    Past Surgical History:  Procedure Laterality Date   AORTIC VALVE REPLACEMENT N/A 08/23/2017   Procedure: AORTIC VALVE REPLACEMENT (AVR) USING INSPIRIS RESILIA AORTIC VALVE SIZE 25 MM;  Surgeon: Rexene Alberts, MD;  Location: [place];  Service:  Open Heart Surgery;  Laterality: N/A;   CARDIAC VALVE REPLACEMENT N/A    Phreesia 02/07/2020   COLONOSCOPY WITH PROPOFOL N/A 11/14/2020   Procedure: COLONOSCOPY WITH PROPOFOL;  Surgeon: Gatha Mayer, MD;  Location[facility] ENDOSCOPY;  Service: Endoscopy;  Laterality: N/A;   ESOPHAGOGASTRODUODENOSCOPY (EGD) WITH PROPOFOL N/A 11/14/2020   Procedure: ESOPHAGOGASTRODUODENOSCOPY (EGD) WITH PROPOFOL;  Surgeon: Gatha Mayer, MD;  Location: [place];  Service: Endoscopy;  Laterality: N/A;   MULTIPLE EXTRACTIONS WITH ALVEOLOPLASTY N/A 07/23/2017   Procedure: Extraction of tooth #10 with  alveoloplasty and gross debridement of remaining teeth;  Surgeon: Lenn Cal, DDS;  Location: [place];  Service: Oral Surgery;  Laterality: N/A;   PACEMAKER IMPLANT N/A 08/28/2017    St Jude [employer] Assurity MRI conditional  dual-chamber pacemaker for symptomatic complete heart blockby Dr Rayann Heman   TEE WITHOUT CARDIOVERSION N/A 07/16/2017   Procedure: TRANSESOPHAGEAL ECHOCARDIOGRAM (TEE);  Surgeon: Lelon Perla, MD;  [facility] ENDOSCOPY;  Service: Cardiovascular;  Laterality: N/A;   TEE WITHOUT CARDIOVERSION N/A 08/23/2017   Procedure: TRANSESOPHAGEAL ECHOCARDIOGRAM (TEE);  Surgeon: Rexene Alberts, MD;  Location: [place];  Service: Open Heart Surgery;  Laterality: N/A;   THORACIC AORTIC ANEURYSM REPAIR N/A 08/23/2017   Procedure: THORACIC ASCENDING ANEURYSM REPAIR (AAA) USING HEMASHIELD GOLD KNITTED MICROVEL DOUBLE VELOUR VASCULAR GRAFT D: 24 MM  L: 30 CM;  Surgeon: Rexene Alberts, MD;  Location: [place];  Service: Open Heart Surgery;  Laterality: N/A;     Current Meds  Medication Sig   acetaminophen (TYLENOL) 325 MG tablet Take 650 mg by mouth every 6 (six) hours as needed for mild pain.   albuterol (PROVENTIL) (2.5 MG/3ML) 0.083% nebulizer solution INHALE 1 VIAL VIA NEBULIZER EVERY 6 HOURS AS NEEDED FOR WHEEZING OR SHORTNESS OF BREATH   albuterol (VENTOLIN HFA) 108 (90 Base) MCG/ACT inhaler INHALE 2 PUFFS INTO THE LUNGS EVERY 6 (SIX) HOURS AS NEEDED FOR WHEEZING OR SHORTNESS OF BREATH.   allopurinol (ZYLOPRIM) 300 MG tablet TAKE 1 TABLET EVERY DAY   ammonium lactate (AMLACTIN) 12 % lotion Apply 1 application topically as needed for dry skin.   atorvastatin (LIPITOR) 40 MG tablet TAKE 1 TABLET EVERY DAY   azithromycin (ZITHROMAX) 250 MG tablet Take 2 tablets on day 1, then 1 tablet daily on days 2 through 5   benzonatate (TESSALON) 100 MG capsule Take 1 capsule (100 mg total) by mouth 3 (three) times daily as needed for cough.   Blood Glucose Monitoring Suppl (TRUE METRIX METER)  w/Device KIT USE AS DIRECTED   buPROPion (WELLBUTRIN XL) 150 MG 24 hr tablet Take 1 tablet (150 mg total) by mouth daily.   [START ON 02/17/2021] buPROPion (WELLBUTRIN XL) 300 MG 24 hr tablet Take 1 tablet (300 mg total) by mouth daily.   diclofenac Sodium (VOLTAREN) 1 % GEL Apply 2 g topically 4 (four) times daily.   docusate sodium (COLACE) 100 MG capsule Take 2 capsules (200 mg total) by mouth 2 (two) times daily.   empagliflozin (JARDIANCE) 10 MG TABS tablet Take 1 tablet (10 mg total) by mouth daily before breakfast.   FEROSUL 325 (65 Fe) MG tablet TAKE 1 TABLET EVERY DAY   fexofenadine (ALLEGRA) 180 MG tablet Take 180 mg by mouth daily.   gabapentin (NEURONTIN) 100 MG capsule Take 1 capsule in the morning and 2 capsules in the evening.   Krill Oil 350 MG CAPS Take 1 capsule by mouth in the morning and at bedtime.    lisinopril (  ZESTRIL) 20 MG tablet Take 1 tablet (20 mg total) by mouth daily.   loratadine (CLARITIN) 10 MG tablet Take 10 mg by mouth daily as needed for allergies.   metFORMIN (GLUCOPHAGE) 850 MG tablet Take 1 tablet (850 mg total) by mouth 2 (two) times daily with a meal.   metolazone (ZAROXOLYN) 5 MG tablet TAKE 1 TABLET (5 MG TOTAL) BY MOUTH THREE TIMES A WEEK.   metoprolol tartrate (LOPRESSOR) 100 MG tablet TAKE 1 TABLET TWICE DAILY   pantoprazole (PROTONIX) 40 MG tablet Take 1 tablet (40 mg total) by mouth daily.   polyethylene glycol (MIRALAX) 17 g packet Take 17 g by mouth daily as needed.   potassium chloride SA (KLOR-CON) 20 MEQ tablet TAKE 3 TABS DAILY AND TAKE AN ADDITIONAL 1 TAB ON THE DAY YOU TAKE YOUR WEEKLY METOLAZONE DOSE   rivaroxaban (XARELTO) 20 MG TABS tablet TAKE 1 Tablet BY MOUTH ONCE EVERY DAY WITH SUPPER   Semaglutide (RYBELSUS) 14 MG TABS Take 14 mg by mouth in the morning.   terbinafine (LAMISIL) 250 MG tablet Take 1 tablet by mouth once daily   torsemide (DEMADEX) 20 MG tablet Take 40 mg in the AM and Take 20 mg in the PM   TRUE METRIX BLOOD GLUCOSE  TEST test strip TEST ONE TIME DAILY FOR DIABETES   TRUEplus Lancets 33G MISC USE TO TEST ONE TIME DAILY FOR DIABETES   [DISCONTINUED] glipiZIDE (GLUCOTROL XL) 5 MG 24 hr tablet Take 5 mg by mouth daily.     Allergies:   Patient has no known allergies.   ROS:   Please see the history of present illness.     All other systems reviewed and are negative.   Labs/Other Tests and Data Reviewed:    Recent Labs: 10/04/2020: TSH 4.060 11/15/2020: Magnesium 2.2 11/24/2020: ALT 27 01/30/2021: B Natriuretic Peptide 67.0; BUN 48; Creatinine, Ser 2.13; Hemoglobin 11.4; Platelets 302; Potassium 3.7; Sodium 134   Recent Lipid Panel Lab Results  Component Value Date/Time   CHOL 123 06/10/2020 08:37 AM   TRIG 281 (H) 06/10/2020 08:37 AM   HDL 25 (L) 06/10/2020 08:37 AM   CHOLHDL 4.9 06/10/2020 08:37 AM   CHOLHDL 6.6 01/01/2020 10:14 AM   LDLCALC 54 06/10/2020 08:37 AM    Wt Readings from Last 3 Encounters:  01/31/21 (!) 480 lb (217.7 kg)  01/30/21 (!) 470 lb (213.2 kg)  01/21/21 (!) 476 lb (215.9 kg)     ASSESSMENT & PLAN:    [facility] ER visit follow-up ER chart reviewed Will recheck BMP Advised to avoid diuretic for 2 days Advised that his dyspnea is not just because of volume overload, and avoid taking extra diuretic Dyspnea likely due to COPD, OSA/OHS Will continue lisinopril and Jardiance for now Adequate hydration  URTI Continue symptomatic treatment for now Albuterol as needed  Time:   Today, I have spent 17 minutes reviewing the chart, including problem list, medications, and with the patient with telehealth technology discussing the above problems.   Medication Adjustments/Labs and Tests Ordered: Current medicines are reviewed at length with the patient today.  Concerns regarding medicines are outlined above.   Tests Ordered: No orders of the defined types were placed in this encounter.   Medication Changes: No orders of the defined types were placed in this  encounter.    Note: This dictation was prepared with Dragon dictation along with smaller phrase technology. Similar sounding words can be transcribed inadequately or may not be corrected upon review. Any transcriptional  errors that result from this process are unintentional.      Disposition:  Follow up  Signed, [name], MD  01/31/2021 8:25 AM     [employer]

## 2021-02-01 ENCOUNTER — Ambulatory Visit: Payer: Medicare HMO | Admitting: *Deleted

## 2021-02-01 ENCOUNTER — Other Ambulatory Visit: Payer: Self-pay | Admitting: Internal Medicine

## 2021-02-01 ENCOUNTER — Ambulatory Visit: Payer: Medicare HMO | Admitting: Pharmacist

## 2021-02-01 DIAGNOSIS — J449 Chronic obstructive pulmonary disease, unspecified: Secondary | ICD-10-CM

## 2021-02-01 DIAGNOSIS — E1169 Type 2 diabetes mellitus with other specified complication: Secondary | ICD-10-CM

## 2021-02-01 DIAGNOSIS — I5032 Chronic diastolic (congestive) heart failure: Secondary | ICD-10-CM

## 2021-02-01 DIAGNOSIS — F339 Major depressive disorder, recurrent, unspecified: Secondary | ICD-10-CM

## 2021-02-01 DIAGNOSIS — F419 Anxiety disorder, unspecified: Secondary | ICD-10-CM

## 2021-02-01 DIAGNOSIS — I10 Essential (primary) hypertension: Secondary | ICD-10-CM

## 2021-02-01 DIAGNOSIS — I480 Paroxysmal atrial fibrillation: Secondary | ICD-10-CM

## 2021-02-01 NOTE — Patient Instructions (Signed)
Reginald [name],  It was great to talk to you today!  Please call me with any questions or concerns.   Visit Information  PATIENT GOALS:  Goals Addressed             This Visit's Progress    Medication Management       Patient Goals/Self-Care Activities Over the next 90 days, patient will:  take medications as prescribed check glucose twice daily (fasting and 2 hours after dinner), document, and provide at future appointments check blood pressure at least once daily, document, and provide at future appointments weigh daily, and contact provider if weight gain of 3 lbs in a day or more than 5 lbs in a week            Patient verbalizes understanding of instructions provided today and agrees to view in [place].   Telephone follow up appointment with care management team member scheduled for: 02/03/21 Next PCP appointment scheduled for: 02/04/21  Kennon Holter, PharmD Clinical Pharmacist [facility] [phone]

## 2021-02-01 NOTE — Patient Instructions (Signed)
Visit Information  PATIENT GOALS:  Goals Addressed               This Visit's Progress     Reduce symptoms of depression (pt-stated)        Timeframe:  Long-Range Goal Priority:  High Start Date:       12/21/20                      Expected End Date:        02/17/21                      Follow Up Date 02/04/21  -call 911 for emergency or [phone] for mental health support/crisis  -discuss medication adjustment/options with PCP and Pharmacist at PCP office - avoid negative self-talk - develop a personal safety plan - develop a plan to deal with triggers like holidays, anniversaries - have a plan for how to handle bad days - spend time or talk with others at least 2 to 3 times per week - spend time or talk with others every day - watch for early signs of feeling worse    Why is this important?   Keeping track of your progress will help your treatment team find the right mix of medicine and therapy for you.  Write in your journal every day.  Day-to-day changes in depression symptoms are normal. It may be more helpful to check your progress at the end of each week instead of every day.     Notes:         The patient verbalized understanding of instructions, educational materials, and care plan provided today and declined offer to receive copy of patient instructions, educational materials, and care plan.   Telephone follow up appointment with care management team member scheduled for:02/04/21  Eduard Clos MSW, LCSW Licensed Clinical Psychiatrist Primary Care [phone]

## 2021-02-01 NOTE — Chronic Care Management (AMB) (Signed)
Chronic Care Management Pharmacy Note  02/01/2021 Name:  Reginald [name] MRN:  [id number] DOB:  12-Oct-1970  Summary:  Patient sent message regarding increased weight and recent acute kidney injury from hospitalization over the weekend After further questioning, patient reports stable weight from yesterday (480>>481) but over the last couple weeks notes a weight increase of 14 lbs (467 >> 481) but recently had hypotension and acute kidney injury after taking metolazone  Patient was instructed to follow PCP recommendations from visit on 01/31/21.  Continue Jardiance 10 mg by mouth daily, lisinopril 20 mg by mouth once daily, and metoprolol tartrate 100 mg by mouth twice daily Hold torsemide and metolazone until instructed to restart by either PCP or cardiology  Check BMP this week Encourage dietary sodium restriction (<3 g/day) Educated on the importance of weighing daily. Patient aware to contact cardiology/primary care team if weight gain >3 lbs in 1 day or >5 lbs in 1 week  Recommendations/Changes made from today's visit: Given more recent shortness of breath, would consider adding a LAMA such as Spiriva Respimat. Spiriva is covered under the [place] program so will plan to apply for patient assistance if patient receives Medicare Extra Help denial or can proceed with sending prescription to pharmacy if patient approved for Medicare Extra Help.  Subjective: Reginald [name] is an 50 y.o. year old male who is a primary patient of [name], MD.  The CCM team was consulted for assistance with disease management and care coordination needs.    Engaged with patient by telephone for follow up visit in response to provider referral for pharmacy case management and/or care coordination services.   Consent to Services:  The patient was given information about Chronic Care Management services, agreed to services, and gave verbal consent prior to initiation of services.  Please see initial visit  note for detailed documentation.   Patient Care Team: [name], MD as PCP - General (Internal Medicine) Thompson Grayer, MD as PCP - Electrophysiology (Cardiology) Josue Hector, MD as PCP - Cardiology (Cardiology) Kassie Mends, RN as [place] [facility] [place], Ranae Pila, LCSW as Social Worker (Licensed Clinical Social Worker) Beryle Lathe, [facility] (Pharmacist)  Objective:  Lab Results  Component Value Date   CREATININE 2.13 (H) 01/30/2021   CREATININE 1.18 01/05/2021   CREATININE 2.28 (H) 12/20/2020    Lab Results  Component Value Date   HGBA1C 5.4 11/12/2020   Last diabetic Eye exam: No results found for: HMDIABEYEEXA  Last diabetic Foot exam: No results found for: HMDIABFOOTEX      Component Value Date/Time   CHOL 123 06/10/2020 0837   TRIG 281 (H) 06/10/2020 0837   HDL 25 (L) 06/10/2020 0837   CHOLHDL 4.9 06/10/2020 0837   CHOLHDL 6.6 01/01/2020 1014   VLDL 73 (H) 01/01/2020 1014   LDLCALC 54 06/10/2020 0837    Hepatic Function Latest Ref Rng & Units 11/24/2020 11/15/2020 11/14/2020  Total Protein 6.0 - 8.5 g/dL 7.9 8.0 7.8  Albumin 4.0 - 5.0 g/dL 4.3 3.6 3.6  AST 0 - 40 IU/L 41(H) 32 30  ALT 0 - 44 IU/L _0 Alk Phosphatase 44 - 121 IU/L 98 76 72  Total Bilirubin 0.0 - 1.2 mg/dL 0.7 1.4(H) 1.2  Bilirubin, Direct 0.0 - 0.2 mg/dL - - -    Lab Results  Component Value Date/Time   TSH 4.060 10/04/2020 09:43 AM   TSH 5.240 (H) 06/10/2020 08:37 AM  FREET4 1.30 10/04/2020 09:43 AM   FREET4 0.79 (L) 05/03/2018 02:04 PM    CBC Latest Ref Rng & Units 01/30/2021 01/05/2021 11/24/2020  WBC 4.0 - 10.5 K/uL 12.5(H) 10.7(H) 8.0  Hemoglobin 13.0 - 17.0 g/dL 11.4(L) 10.5(L) 12.0(L)  Hematocrit 39.0 - 52.0 % 37.8(L) 35.3(L) 40.9  Platelets 150 - 400 K/uL 302 261 320    No results found for: VD25OH  Clinical ASCVD: No  The ASCVD Risk score (Arnett DK, et al., 2019) failed to calculate for the following reasons:   The valid total  cholesterol range is 130 to 320 mg/dL    Social History   Tobacco Use  Smoking Status Former   Packs/day: 2.00   Years: 16.00   Pack years: 32.00   Types: Cigarettes   Quit date: 08/23/2017   Years since quitting: 3.4  Smokeless Tobacco Never   BP Readings from Last 3 Encounters:  01/30/21 129/80  01/21/21 130/80  01/12/21 128/70   Pulse Readings from Last 3 Encounters:  01/30/21 77  01/21/21 70  01/12/21 69   Wt Readings from Last 3 Encounters:  01/31/21 (!) 480 lb (217.7 kg)  01/30/21 (!) 470 lb (213.2 kg)  01/21/21 (!) 476 lb (215.9 kg)    Assessment: Review of patient past medical history, allergies, medications, health status, including review of consultants reports, laboratory and other test data, was performed as part of comprehensive evaluation and provision of chronic care management services.   SDOH:  (Social Determinants of Health) assessments and interventions performed:    CCM Care Plan  No Known Allergies  Medications Reviewed Today     Reviewed by Beryle Lathe, [facility] (Pharmacist) on 02/01/21 at 0900  Med List Status: <None>   Medication Order Taking? Sig Documenting Provider Last Dose Status Informant  acetaminophen (TYLENOL) 325 MG tablet [id number] Yes Take 650 mg by mouth every 6 (six) hours as needed for mild pain. [provider] Taking Active Self  albuterol (PROVENTIL) (2.5 MG/3ML) 0.083% nebulizer solution [id number] Yes INHALE 1 VIAL VIA NEBULIZER EVERY 6 HOURS AS NEEDED FOR WHEEZING OR SHORTNESS OF BREATH [name], MD Taking Active   albuterol (VENTOLIN HFA) 108 (90 Base) MCG/ACT inhaler [id number] Yes INHALE 2 PUFFS INTO THE LUNGS EVERY 6 (SIX) HOURS AS NEEDED FOR WHEEZING OR SHORTNESS OF BREATH. [name], MD Taking Active            Med Note Lia Hopping, Toniann Fail   Fri Jan 21, 2021  8:16 AM)    allopurinol (ZYLOPRIM) 300 MG tablet [id number] Yes TAKE 1 TABLET EVERY DAY [name], MD Taking Active Self  ammonium  lactate (AMLACTIN) 12 % lotion [id number] Yes Apply 1 application topically as needed for dry skin. Felipa Furnace, DPM Taking Active Self  atorvastatin (LIPITOR) 40 MG tablet [id number] Yes TAKE 1 TABLET EVERY DAY [name], MD Taking Active Self  azithromycin (ZITHROMAX) 250 MG tablet [id number] Yes Take 2 tablets on day 1, then 1 tablet daily on days 2 through 5 [name], MD Taking Active   benzonatate (TESSALON) 100 MG capsule [id number] Yes Take 1 capsule (100 mg total) by mouth 3 (three) times daily as needed for cough. [name], MD Taking Active   Blood Glucose Monitoring Suppl (TRUE METRIX METER) w/Device Drucie Opitz [id number]  USE AS DIRECTED [name], MD  Active Self  buPROPion (WELLBUTRIN XL) 150 MG 24 hr tablet [id number] Yes Take 1 tablet (150 mg total) by mouth daily. Posey Pronto,  Colin Broach, MD Taking Active   buPROPion (WELLBUTRIN XL) 300 MG 24 hr tablet [id number] No Take 1 tablet (300 mg total) by mouth daily.  Patient not taking: Reported on 02/01/2021   [name], MD Not Taking Active   diclofenac Sodium (VOLTAREN) 1 % GEL [id number] Yes Apply 2 g topically 4 (four) times daily. Mordecai Rasmussen, MD Taking Active Self  docusate sodium (COLACE) 100 MG capsule [id number] Yes Take 2 capsules (200 mg total) by mouth 2 (two) times daily. Thurnell Lose, MD Taking Active   empagliflozin (JARDIANCE) 10 MG TABS tablet [id number] Yes Take 1 tablet (10 mg total) by mouth daily before breakfast. Waynetta Pean Taking Active   FEROSUL 325 (65 Fe) MG tablet [id number] Yes TAKE 1 TABLET EVERY DAY [name], MD Taking Active   fexofenadine (ALLEGRA) 180 MG tablet [id number] Yes Take 180 mg by mouth daily. [provider] Taking Active   gabapentin (NEURONTIN) 100 MG capsule [id number] Yes Take 1 capsule in the morning and 2 capsules in the evening. Thurnell Lose, MD Taking Active   Krill Oil 350 MG CAPS [id number] Yes Take 1 capsule by mouth in the morning and at  bedtime.  [provider] Taking Active Self           Med Note Lia Hopping, MINDY L   Fri Jan 21, 2021  8:16 AM)    lisinopril (ZESTRIL) 20 MG tablet [id number] Yes Take 1 tablet (20 mg total) by mouth daily. Erma Heritage, PA-C Taking Active Self  loratadine (CLARITIN) 10 MG tablet [id number] Yes Take 10 mg by mouth daily as needed for allergies. [provider] Taking Active   metFORMIN (GLUCOPHAGE) 850 MG tablet [id number] Yes Take 1 tablet (850 mg total) by mouth 2 (two) times daily with a meal. [name], MD Taking Active Self  metolazone (ZAROXOLYN) 5 MG tablet [id number] No TAKE 1 TABLET (5 MG TOTAL) BY MOUTH THREE TIMES A WEEK.  Patient not taking: Reported on 02/01/2021   Thompson Grayer, MD Not Taking Active   metoprolol tartrate (LOPRESSOR) 100 MG tablet [id number] Yes TAKE 1 TABLET TWICE DAILY Josue Hector, MD Taking Active   pantoprazole (PROTONIX) 40 MG tablet [id number] Yes Take 1 tablet (40 mg total) by mouth daily. [name], MD Taking Active Self  polyethylene glycol (MIRALAX) 17 g packet [id number] Yes Take 17 g by mouth daily as needed. Thurnell Lose, MD Taking Active   potassium chloride SA (KLOR-CON) 20 MEQ tablet [id number] No TAKE 3 TABS DAILY AND TAKE AN ADDITIONAL 1 TAB ON THE DAY YOU TAKE YOUR WEEKLY METOLAZONE DOSE  Patient not taking: Reported on 02/01/2021   Erma Heritage, PA-C Not Taking Active   rivaroxaban (XARELTO) 20 MG TABS tablet [id number] Yes TAKE 1 Tablet BY MOUTH ONCE EVERY DAY WITH SUPPER Josue Hector, MD Taking Active Self  Semaglutide (RYBELSUS) 14 MG TABS [id number] No Take 14 mg by mouth in the morning.  Patient not taking: Reported on 02/01/2021   [provider] Not Taking Active            Med Note Lia Hopping, MINDY L   Fri Jan 21, 2021  8:16 AM)    terbinafine (LAMISIL) 250 MG tablet [id number] Yes Take 1 tablet by mouth once daily [name], MD Taking Active   torsemide (DEMADEX) 20 MG tablet [id number]  No Take 40 mg in the AM and Take 20 mg in the PM  Patient not taking: Reported on 02/01/2021   Erma Heritage, PA-C Not Taking Active Self  TRUE METRIX BLOOD GLUCOSE TEST test strip [id number]  TEST ONE TIME DAILY FOR DIABETES [name], MD  Active Self  TRUEplus Lancets 33G MISC [id number]  USE TO TEST ONE TIME DAILY FOR DIABETES [name], MD  Active Self            Patient Active Problem List   Diagnosis Date Noted   Rectal bleeding    Chronic blood loss anemia 11/12/2020   Gastrointestinal hemorrhage 10/08/2020   S/P placement of cardiac pacemaker 02/10/2020   Encounter for examination following treatment at hospital 02/10/2020   Anxiety 02/10/2020   DM2 (diabetes mellitus, type 2) ([place]) 02/10/2020   OSA on CPAP    Abnormal liver function 03/16/2019   History of pulmonary embolism 03/16/2019   Paroxysmal atrial fibrillation ([place]) 09/05/2018   COPD (chronic obstructive pulmonary disease) ([place]) 05/07/2018   Gastroesophageal reflux disease    Pulmonary embolism ([place]) 09/18/2017   Encounter for therapeutic drug monitoring 09/04/2017   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 08/23/2017   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   Pulmonary hypertension ([place])    Chronic periodontitis 07/18/2017   Morbid obesity ([place])    Essential hypertension    Tobacco abuse    Chronic heart failure with preserved ejection fraction (HFpEF) ([place])    Chronic venous insufficiency     Immunization History  Administered Date(s) Administered   Influenza Inj Mdck Quad Pf 02/27/2019   Influenza,inj,Quad PF,6+ Mos 02/10/2020   Janssen (J&J) SARS-COV-2 Vaccination 09/16/2019   PFIZER(Purple Top)SARS-COV-2 Vaccination 03/15/2020   Pneumococcal Polysaccharide-23 03/17/2019    Conditions to be addressed/monitored: Atrial Fibrillation, CHF, HTN, COPD, DMII, Anxiety, and Depression  Care Plan : Medication Management   Updates made by Beryle Lathe, [place] since 02/01/2021 12:00 AM     Problem: COPD, HFpEF, HTN, T2DM, Afib, anxiety/depression   Priority: High  Onset Date: 12/28/2020     Long-Range Goal: Disease Progression Prevention   Start Date: 12/28/2020  Expected End Date: 03/28/2021  Recent Progress: On track  Priority: High  Note:   Current Barriers:  Unable to independently monitor therapeutic efficacy  Pharmacist Clinical Goal(s):  Over the next 90 days, patient will achieve adherence to monitoring guidelines and medication adherence to achieve therapeutic efficacy through collaboration with PharmD and provider.   Interventions: 1:1 collaboration with [name], MD regarding development and update of comprehensive plan of care as evidenced by provider attestation and co-signature Inter-disciplinary care team collaboration (see longitudinal plan of care) Comprehensive medication review performed; medication list updated in electronic medical record  Diabetes: Current medications: metformin 850 mg by mouth twice daily and Jardiance 10 mg by mouth daily (Jardiance recently held briefly due to hypotension and acute kidney injury but patient was instructed to restart after visit with PCP on 01/31/21) Semaglutide (Rybelsus) 14 mg refill in process with [employer]. Patient should receive soon. Intolerances: none Taking medications as directed: yes Side effects thought to be attributed to current medication regimen: GI upset with metformin but ok with continuing for now given benefits Patient denies hypoglycemia Current meal patterns: breakfast: eggs, oatmeal, chicken sandwhich, and baked beans ; lunch:  ribeye and [employer] Breast in air fryer ; dinner:  leftovers from lunch ; snacks: none; drinks: water, regular soda, milk, and sports drinks Has noticed decreased appetite since starting Rybelsus Current exercise: none Controlled;  Most recent A1c at goal of <7% per ADA  guidelines Current glucose readings: 151-193 over last few days Medication: Identify diabetes medication and when best taken Monitoring: target blood sugar range, when to test Hypoglycemia: I have discussed with the patient how to treat hypoglycemia by the rule of 15; eat/drink 15g of sugar in the form of glucose tabs, 4 ounces of juice or soda and recheck fingerstick glucose in 15 minutes. Chocolate bars and ice cream should be avoided because fat delays carbohydrate digestion and absorption. Retreat if glucose remains low. Driving should cease until glucose is normal. Continue metformin 850 mg by mouth twice daily, Jardiance 10 mg by mouth daily and semaglutide (Rybelsus) 14 mg by mouth at least 30 minutes before the first food, beverage, or other oral medications of the day with no more than 4 ounces of plain water only Medicare Extra Help application submitted recently. Based on information provided, patient will likely be denied. Will plan to submit denial to [facility] for Jardiance patient assistance.  Instructed to monitor blood sugars once a day at the following times: fasting (at least 8 hours since last food consumption), 2 hours after dinner, and whenever patient experiences symptoms of hypo/hyperglycemia Encouraged regular aerobic exercise with a goal of 30 minutes five times per week (150 minutes per week) Discussed management of hypoglycemia. If blood sugar <70 at any time, treat with simple sugar such as 1/2 cup juice or regular soda or 3-4 glucose tablets. Recheck blood sugar in 15 minutes and repeat if blood sugar remains <70.   Hypertension: Current medications: lisinopril 20 mg by mouth once daily (recently held briefly due to hypotension and acute kidney injury but patient was instructed to restart after visit with PCP on 01/31/21) Patient also takes diuretics (torsemide + metolazone as needed) Intolerances: none Taking medications as directed: yes Side effects thought to be  attributed to current medication regimen: no Reports some orthostatic hypotension but knows to get up slowly Current exercise: none Home blood pressure readings: not checking currently Blood pressure under fair control. Factors affecting control of BP include high salt intake, elevated BMI, lack of exercise, sleep apnea, and volume overload. Blood pressure is at goal of <130/80 mmHg per 2017 AHA/ACC guidelines. Continue lisinopril 20 mg by mouth once daily Check BMP at next PCP visit Encourage dietary sodium restriction/DASH diet Recommend regular aerobic exercise Recommend home blood pressure monitoring, to bring results in next visit Discussed need for and importance of continued work on weight loss Discussed need for medication compliance Reviewed risks of hypertension, principles of treatment and consequences of untreated hypertension Patient received CPAP on 01/20/21 which will likely improve blood pressure   Heart failure with preserved ejection fraction (LVEF >50% with evidence of spontaneous or provokable increased LV filling pressures): Appropriately managed; added SGLT2 inhibitor in Agust 2022 once serum creatinine improved  Has pacemaker Current treatment: lisinopril 20 mg by mouth once daily, metoprolol tartrate 100 mg by mouth twice daily, empagliflozin (Jardiance) 10 mg by mouth once daily Currently on hold: furosemide and metolazone due to acute kidney injury  Stage C (Symptomatic heart failure)/NYHA Class III (Marked limitation of physical activity. Comfortable at rest. Less than ordinary activity causes fatigue, palpitation, or dyspnea) Most recent echocardiogram was in March 2022. LVEF was stable at 60-65%. BNP: 67 (01/30/21) Current home vitals: not checking Current home weights: patient reports stable weight from yesterday (480>>481) but over the last couple weeks notes a weight increase of 14 lbs (467 >> 481) but  recently had hypotension and acute kidney injury after taking  metolazone  Reports shortness of breath with activity or when lying down, fatigue and weakness, swelling in the legs, ankles and feet, and reduced ability to exercise. Denies persistent cough or wheezing, nausea and lack of appetite, and chest pain Patient was instructed to follow PCP recommendations from visit on 01/31/21.  Continue Jardiance 10 mg by mouth daily, lisinopril 20 mg by mouth once daily, and metoprolol tartrate 100 mg by mouth twice daily Hold torsemide and metolazone until instructed to restart by either PCP or cardiology  Consider transition from metoprolol tartrate to metoprolol succinate to decrease pill burden per patient request.  Check BMP this week Encourage dietary sodium restriction (<3 g/day) Educated on the importance of weighing daily. Patient aware to contact cardiology/primary care team if weight gain >3 lbs in 1 day or >5 lbs in 1 week Recommend home blood pressure monitoring, to bring results in next visit Discussed need for and importance of continued work on weight loss Discussed need for medication compliance Recommend restricted fluid intake (eg, 2 L/day)  Chronic Obstructive Pulmonary Disease: Controlled per patient - only uses albuterol as needed Patient now has CPAP machine Stopped smoking 08/23/2017; smoked for 30 years (1 PPD) Current treatment: albuterol metered dose (ProAir, Ventolin, Proventil) 1 puff by mouth as needed for shortness of breath; only using ~2 times per week GOLD Classification: unknown CAT score (12/28/20): 23 Most recent Pulmonary Function Testing: N/A 0 exacerbations requiring treatment in the last 6 months  Current oxygen requirements: none Given elevated CAT score >10, would consider adding a LAMA such as Spiriva Respimat. Spiriva is covered under the [place] program so will plan to apply for patient assistance if patient receives Medicare Extra Help denial or can proceed with sending prescription to pharmacy if patient approved for  Medicare Extra Help    Atrial Fibrillation: Controlled Current rate control: metoprolol tartrate 100 mg by mouth twice daily Most recent ECG: normal sinus Anticoagulation: rivaroxaban (Xarelto) 20 mg by mouth once daily Denies signs and symptoms of bleeding CHADS2VASc score: 3 (CHF, hypertension, and diabetes) Prior ablation: _0  Yes  _1  No Home blood pressure: not checking Home heart rate: not checking Continue metoprolol  Continue rivaroxaban (Xarelto) 20 mg by mouth once daily Encouraged regular physical activity for general health benefits Recommend home blood pressure and heart rate monitoring, to bring results in next visit Discussed need for medication compliance  Anxiety/Depression: Uncontrolled per patient. Talking to his providers helps him.  Current medication: bupropion XL 150 mg by mouth daily (recently discontinued citalopram and buspirone) Patient reports tolerating bupropion well but that he is still irritable. Plan to titrate bupropion to 300 mg daily after 4 weeks on lower dose.   Patient Goals/Self-Care Activities Over the next 90 days, patient will:  take medications as prescribed check glucose twice daily (fasting and 2 hours after dinner), document, and provide at future appointments check blood pressure at least once daily, document, and provide at future appointments weigh daily, and contact provider if weight gain of 3 lbs in a day or more than 5 lbs in a week  Follow Up Plan: Telephone follow up appointment with care management team member scheduled for: 02/03/21 Next appointment with PCP 02/04/21      Medication Assistance:  TBD. Medicare Extra Help application in process  Patient's preferred pharmacy is:  [facility] [address], [place] [address] Phone: [phone]  Fax: [facility] - [place], [place] [address] Phone: [phone] Fax: [phone]  Follow Up:  Patient agrees to Care Plan and Follow-up.  Plan: Telephone follow up appointment with care management team member scheduled for:  02/03/21 and Next PCP appointment scheduled for: 02/03/21  Kennon Holter, PharmD Clinical Pharmacist [facility] [phone]

## 2021-02-01 NOTE — Chronic Care Management (AMB) (Signed)
Chronic Care Management    Clinical Social Work Note  02/01/2021 Name: Reginald [name] MRN: [id number] DOB: 10-28-1970  Reginald [name] is a 50 y.o. year old male who is a primary care patient of [name], MD. The CCM team was consulted to assist the patient with chronic disease management and/or care coordination needs related to: Mental Health Counseling and Resources.   Engaged with patient by telephone for follow up visit in response to provider referral for social work chronic care management and care coordination services.   Consent to Services:  The patient was given information about Chronic Care Management services, agreed to services, and gave verbal consent prior to initiation of services.  Please see initial visit note for detailed documentation.   Patient agreed to services and consent obtained.   Assessment: Review of patient past medical history, allergies, medications, and health status, including review of relevant consultants reports was performed today as part of a comprehensive evaluation and provision of chronic care management and care coordination services.     SDOH (Social Determinants of Health) assessments and interventions performed:    Advanced Directives Status: Not addressed in this encounter.  CCM Care Plan  No Known Allergies  Outpatient Encounter Medications as of 02/01/2021  Medication Sig   acetaminophen (TYLENOL) 325 MG tablet Take 650 mg by mouth every 6 (six) hours as needed for mild pain.   albuterol (PROVENTIL) (2.5 MG/3ML) 0.083% nebulizer solution INHALE 1 VIAL VIA NEBULIZER EVERY 6 HOURS AS NEEDED FOR WHEEZING OR SHORTNESS OF BREATH   albuterol (VENTOLIN HFA) 108 (90 Base) MCG/ACT inhaler INHALE 2 PUFFS INTO THE LUNGS EVERY 6 (SIX) HOURS AS NEEDED FOR WHEEZING OR SHORTNESS OF BREATH.   allopurinol (ZYLOPRIM) 300 MG tablet TAKE 1 TABLET EVERY DAY   ammonium lactate (AMLACTIN) 12 % lotion Apply 1 application topically as needed for dry skin.    atorvastatin (LIPITOR) 40 MG tablet TAKE 1 TABLET EVERY DAY   azithromycin (ZITHROMAX) 250 MG tablet Take 2 tablets on day 1, then 1 tablet daily on days 2 through 5   benzonatate (TESSALON) 100 MG capsule Take 1 capsule (100 mg total) by mouth 3 (three) times daily as needed for cough.   Blood Glucose Monitoring Suppl (TRUE METRIX METER) w/Device KIT USE AS DIRECTED   buPROPion (WELLBUTRIN XL) 150 MG 24 hr tablet Take 1 tablet (150 mg total) by mouth daily.   [START ON 02/17/2021] buPROPion (WELLBUTRIN XL) 300 MG 24 hr tablet Take 1 tablet (300 mg total) by mouth daily. (Patient not taking: Reported on 02/01/2021)   diclofenac Sodium (VOLTAREN) 1 % GEL Apply 2 g topically 4 (four) times daily.   docusate sodium (COLACE) 100 MG capsule Take 2 capsules (200 mg total) by mouth 2 (two) times daily.   empagliflozin (JARDIANCE) 10 MG TABS tablet Take 1 tablet (10 mg total) by mouth daily before breakfast.   FEROSUL 325 (65 Fe) MG tablet TAKE 1 TABLET EVERY DAY   fexofenadine (ALLEGRA) 180 MG tablet Take 180 mg by mouth daily.   gabapentin (NEURONTIN) 100 MG capsule Take 1 capsule in the morning and 2 capsules in the evening.   Krill Oil 350 MG CAPS Take 1 capsule by mouth in the morning and at bedtime.    lisinopril (ZESTRIL) 20 MG tablet Take 1 tablet (20 mg total) by mouth daily.   loratadine (CLARITIN) 10 MG tablet Take 10 mg by mouth daily as needed for allergies.   metFORMIN (GLUCOPHAGE) 850 MG tablet  Take 1 tablet (850 mg total) by mouth 2 (two) times daily with a meal.   metolazone (ZAROXOLYN) 5 MG tablet TAKE 1 TABLET (5 MG TOTAL) BY MOUTH THREE TIMES A WEEK. (Patient not taking: Reported on 02/01/2021)   metoprolol tartrate (LOPRESSOR) 100 MG tablet TAKE 1 TABLET TWICE DAILY   pantoprazole (PROTONIX) 40 MG tablet Take 1 tablet (40 mg total) by mouth daily.   polyethylene glycol (MIRALAX) 17 g packet Take 17 g by mouth daily as needed.   potassium chloride SA (KLOR-CON) 20 MEQ tablet TAKE 3 TABS  DAILY AND TAKE AN ADDITIONAL 1 TAB ON THE DAY YOU TAKE YOUR WEEKLY METOLAZONE DOSE (Patient not taking: Reported on 02/01/2021)   rivaroxaban (XARELTO) 20 MG TABS tablet TAKE 1 Tablet BY MOUTH ONCE EVERY DAY WITH SUPPER   Semaglutide (RYBELSUS) 14 MG TABS Take 14 mg by mouth in the morning. (Patient not taking: Reported on 02/01/2021)   terbinafine (LAMISIL) 250 MG tablet Take 1 tablet by mouth once daily   torsemide (DEMADEX) 20 MG tablet Take 40 mg in the AM and Take 20 mg in the PM (Patient not taking: Reported on 02/01/2021)   TRUE METRIX BLOOD GLUCOSE TEST test strip TEST ONE TIME DAILY FOR DIABETES   TRUEplus Lancets 33G MISC USE TO TEST ONE TIME DAILY FOR DIABETES   No facility-administered encounter medications on file as of 02/01/2021.    Patient Active Problem List   Diagnosis Date Noted   Rectal bleeding    Chronic blood loss anemia 11/12/2020   Gastrointestinal hemorrhage 10/08/2020   S/P placement of cardiac pacemaker 02/10/2020   Encounter for examination following treatment at hospital 02/10/2020   Anxiety 02/10/2020   DM2 (diabetes mellitus, type 2) ([place]) 02/10/2020   OSA on CPAP    Abnormal liver function 03/16/2019   History of pulmonary embolism 03/16/2019   Paroxysmal atrial fibrillation ([place]) 09/05/2018   COPD (chronic obstructive pulmonary disease) ([place]) 05/07/2018   Gastroesophageal reflux disease    Pulmonary embolism ([place]) 09/18/2017   Encounter for therapeutic drug monitoring 09/04/2017   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 08/23/2017   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   Pulmonary hypertension ([place])    Chronic periodontitis 07/18/2017   Morbid obesity ([place])    Essential hypertension    Tobacco abuse    Chronic heart failure with preserved ejection fraction (HFpEF) ([place])    Chronic venous insufficiency     Conditions to be addressed/monitored: Depression; Mental Health Concerns    Care Plan : LCSW PLAN OF CARE  Updates made by Deirdre Peer, LCSW since 02/01/2021 12:00 AM     Problem: Unmanaged depression   Priority: High     Long-Range Goal: Reduce symptoms/feelings related to depression   Start Date: 12/21/2020  Expected End Date: 02/18/2021  This Visit's Progress: On track  Recent Progress: On track  Priority: High  Note:   Current Barriers:  Chronic Mental Health needs related to depression Mental Health Concerns  Suicidal Ideation/Homicidal Ideation: No  Clinical Social Work Goal(s):  patient will work with SW  PRN  by telephone or in person to reduce or manage symptoms related to depression  patient will work with SW to address concerns related to depression and treatment options patient will work with PCP and Pharmacist to address needs related to medication management/adjustment of current RX depression regimen to better treat symptoms patient will demonstrate improved health management independence as evidenced by reduction  of PHQ9 score  Interventions: 02/01/21: CSW received call from pt today who reports having a lot of side effects from medications (recently changed/adjusted). Per pt, "I am crying more and over nothing". He also shared that he called the 44 hotline for mental health crisis and was told the line was for people suicidal. Pt states, "I'm not suicidal and would not do that because I love myself too much... and my pets...." he also states that his "faith prevents him from doing that".  Pt has a lot of anxiety today around his medications and reports being told to do different things by the different providers. CSW advised pt he would need to get guidance from clinical staff (PCP, [place], RN) and CSW will alert the team of above.  CSW did remind pt to call CSW anytime and to alwaysnot hesitate to call 911 if emergency arises.    01/14/21: CSW spoke with pt who admits to history of depression. Pt denies any current or past SI/HI,  hospitalizations. Pt is currently on Buspar and Celexa for his depression; his PHQ9 score today was 24- CSW discussed with pt the significance/severity of his score and discussed options. Pt declines counseling support. He also declines referral to Psychiatry indicating he use to see "Dr Truman Hayward" who prescribed his meds but now his PCP does. Pt does not wish to be referred back to "Dr Truman Hayward" nor any other Psychiatrist. Pt also shares that the Buspar is 3 x daily- would like to consider other options that may be better coverage of his symptoms and needs; and less pills- "have to remember to take it".  CSW discussed HCPOA with pt; he does not have anyone appointed and states he does not have a relationship with his family. Encouraged pt to consider completion of Advance Directives- will mail pt packet to review.  Patient interviewed and appropriate assessments performed: PHQ 9 1:1 collaboration with [name], MD regarding development and update of comprehensive plan of care as evidenced by provider attestation and co-signature Patient interviewed and appropriate assessments performed Provided mental health counseling with regard to depression; medications, counseling options  Discussed plans with patient for ongoing care management follow up and provided patient with direct contact information for care management team Provided education and assistance to client regarding Advanced Directives. Depression screen reviewed , PHQ2/ PHQ9 completed, Solution-Focused Strategies, Active listening / Reflection utilized , Psychoeducation for mental health needs , Reviewed mental health medications with patient and discussed compliance: discussed options for counseling and/or RX adjustments being made, Participation in counseling encouraged , Verbalization of feelings encouraged , Suicidal Ideation/Homicidal Ideation assessed:, Discussed [place] , Discussed referral to [facility] to assist with  connecting to mental health provider, and pt declines counseling at this time  Patient Self Care Activities:  Self administers medications as prescribed Attends all scheduled provider appointments Performs ADL's independently Performs IADL's independently Independent living  Patient Coping Strengths:  Hopefulness Self Advocate Able to Communicate Effectively  Patient Self Care Deficits:  Lacks social connections Family not involved/connected  Patient Goals:  - avoid negative self-talk - develop a personal safety plan - develop a plan to deal with triggers like holidays, anniversaries - have a plan for how to handle bad days - spend time or talk with others at least 2 to 3 times per week - spend time or talk with others every day - watch for early signs of feeling worse - call and visit an old friend - check out volunteer  opportunities - laugh; watch a funny movie or comedian - perform a random act of kindness - practice relaxation or meditation daily - talk about feelings with a friend, family or spiritual advisor - practice positive thinking and self-talk - call to cancel if needed - keep a calendar with prescription refill dates - keep a calendar with appointment dates  Follow Up Plan: Appointment scheduled for SW follow up with client by phone on: 02/04/21 and Client will discuss medications with PCP and Pharmacist       Follow Up Plan: Appointment scheduled for SW follow up with client by phone on: 02/04/21      Eduard Clos MSW, LCSW Licensed Clinical Social Education officer, environmental Primary Care [phone]

## 2021-02-02 ENCOUNTER — Encounter (HOSPITAL_COMMUNITY): Payer: Self-pay

## 2021-02-02 ENCOUNTER — Emergency Department (HOSPITAL_COMMUNITY)
Admission: EM | Admit: 2021-02-02 | Discharge: 2021-02-02 | Disposition: A | Discharge status: Home / Self Care | Payer: Medicare HMO | Attending: Emergency Medicine | Admitting: Emergency Medicine

## 2021-02-02 ENCOUNTER — Other Ambulatory Visit: Payer: Self-pay

## 2021-02-02 DIAGNOSIS — Z7901 Long term (current) use of anticoagulants: Secondary | ICD-10-CM | POA: Insufficient documentation

## 2021-02-02 DIAGNOSIS — J029 Acute pharyngitis, unspecified: Secondary | ICD-10-CM | POA: Diagnosis not present

## 2021-02-02 DIAGNOSIS — Z79899 Other long term (current) drug therapy: Secondary | ICD-10-CM | POA: Diagnosis not present

## 2021-02-02 DIAGNOSIS — Z87891 Personal history of nicotine dependence: Secondary | ICD-10-CM | POA: Insufficient documentation

## 2021-02-02 DIAGNOSIS — J449 Chronic obstructive pulmonary disease, unspecified: Secondary | ICD-10-CM | POA: Insufficient documentation

## 2021-02-02 DIAGNOSIS — I5032 Chronic diastolic (congestive) heart failure: Secondary | ICD-10-CM | POA: Diagnosis not present

## 2021-02-02 DIAGNOSIS — J45909 Unspecified asthma, uncomplicated: Secondary | ICD-10-CM | POA: Diagnosis not present

## 2021-02-02 DIAGNOSIS — Z950 Presence of cardiac pacemaker: Secondary | ICD-10-CM | POA: Diagnosis not present

## 2021-02-02 DIAGNOSIS — E119 Type 2 diabetes mellitus without complications: Secondary | ICD-10-CM | POA: Diagnosis not present

## 2021-02-02 DIAGNOSIS — Z20822 Contact with and (suspected) exposure to covid-19: Secondary | ICD-10-CM | POA: Diagnosis not present

## 2021-02-02 DIAGNOSIS — I110 Hypertensive heart disease with heart failure: Secondary | ICD-10-CM | POA: Insufficient documentation

## 2021-02-02 DIAGNOSIS — Z7984 Long term (current) use of oral hypoglycemic drugs: Secondary | ICD-10-CM | POA: Diagnosis not present

## 2021-02-02 DIAGNOSIS — D72829 Elevated white blood cell count, unspecified: Secondary | ICD-10-CM | POA: Insufficient documentation

## 2021-02-02 LAB — BASIC METABOLIC PANEL
Anion gap: 11 (ref 5–15)
BUN: 33 mg/dL — ABNORMAL HIGH (ref 6–20)
CO2: 29 mmol/L (ref 22–32)
Calcium: 9.3 mg/dL (ref 8.9–10.3)
Chloride: 96 mmol/L — ABNORMAL LOW (ref 98–111)
Creatinine, Ser: 1.39 mg/dL — ABNORMAL HIGH (ref 0.61–1.24)
GFR, Estimated: >60 mL/min (ref >60)
Glucose, Bld: 152 mg/dL — ABNORMAL HIGH (ref 70–99)
Potassium: 3.5 mmol/L (ref 3.5–5.1)
Sodium: 136 mmol/L (ref 135–145)

## 2021-02-02 LAB — CBC WITH DIFFERENTIAL/PLATELET
Abs Immature Granulocytes: 0.22 10*3/uL — ABNORMAL HIGH (ref 0.00–0.07)
Basophils Absolute: 0.1 10*3/uL (ref 0.0–0.1)
Basophils Relative: 1 %
Eosinophils Absolute: 0.4 10*3/uL (ref 0.0–0.5)
Eosinophils Relative: 3 %
HCT: 37.3 % — ABNORMAL LOW (ref 39.0–52.0)
Hemoglobin: 11.5 g/dL — ABNORMAL LOW (ref 13.0–17.0)
Immature Granulocytes: 2 %
Lymphocytes Relative: 18 %
Lymphs Abs: 2.3 10*3/uL (ref 0.7–4.0)
MCH: 26.5 pg (ref 26.0–34.0)
MCHC: 30.8 g/dL (ref 30.0–36.0)
MCV: 85.9 fL (ref 80.0–100.0)
Monocytes Absolute: 0.7 10*3/uL (ref 0.1–1.0)
Monocytes Relative: 6 %
Neutro Abs: 8.9 10*3/uL — ABNORMAL HIGH (ref 1.7–7.7)
Neutrophils Relative %: 70 %
Platelets: 290 10*3/uL (ref 150–400)
RBC: 4.34 MIL/uL (ref 4.22–5.81)
RDW: 19.9 % — ABNORMAL HIGH (ref 11.5–15.5)
WBC: 12.6 10*3/uL — ABNORMAL HIGH (ref 4.0–10.5)
nRBC: 0 % (ref 0.0–0.2)

## 2021-02-02 LAB — RESP PANEL BY RT-PCR (FLU A&B, COVID) ARPGX2
Influenza A by PCR: NEGATIVE
Influenza B by PCR: NEGATIVE
SARS Coronavirus 2 by RT PCR: NEGATIVE

## 2021-02-02 MED ORDER — LIDOCAINE VISCOUS HCL 2 % MT SOLN
15.0000 mL | Freq: Once | OROMUCOSAL | Status: AC
  Administered 2021-02-02: 15 mL via OROMUCOSAL
  Filled 2021-02-02: qty 15

## 2021-02-02 MED ORDER — PENICILLIN V POTASSIUM 500 MG PO TABS: 500.0000 mg | ORAL_TABLET | Freq: Four times a day (QID) | ORAL | 0 refills | Status: DC

## 2021-02-02 MED ORDER — HYDROXYZINE HCL 25 MG PO TABS: 25.0000 mg | ORAL_TABLET | Freq: Four times a day (QID) | ORAL | 0 refills | Status: DC

## 2021-02-02 NOTE — ED Provider Notes (Signed)
[facility] Provider Note   CSN: [id number] Arrival date & time: 02/02/21  1022     History Chief Complaint  Patient presents with   Sore Throat    Reginald [name] is a 50 y.o. male.  HPI  50 year old male with a history of anxiety, aortic stenosis, arthritis, asthma, back pain, CHF, constipation, COPD, depression, diabetes, dyspnea, hypertension, gout, heart murmur, hypertension, obesity, prostatitis, PE, pulmonary hypertension, pyelonephritis, thoracic a ascending aortic aneurysm, who presents to the emergency department today for evaluation of a sore throat.  Patient states he has had a sore throat for the last few weeks.  He is also had some nasal congestion, postnasal drip and a feeling of getting choked when he is sleeping at night despite using his CPAP.  He states that the sore throat seems to be worse in the morning gets better throughout the day.  He takes allergy medication and has been trying Mucinex which seems to improve his symptoms temporarily.  He feels very anxious at this time and states he was seen in the ED last week and told that he had abnormal kidney function.  He is very nervous about this and wants to have his labs rechecked.  He is also worried about his sugars.  He recently reports that he changed his medication for anxiety and does not feel that it is working at.  He denies any SI at this time.  Past Medical History:  Diagnosis Date   Anxiety    Aortic stenosis    Arthritis    Asthma    Back pain    Cellulitis of skin with lymphangitis    CHF (congestive heart failure) (HCC)    Chronic diastolic congestive heart failure (HCC)    Chronic venous insufficiency    Constipation    COPD (chronic obstructive pulmonary disease) (HCC)    Depression    Diabetes ([place])    Dyspnea    Essential hypertension    Gout    Heart murmur    Hypertension    Morbid obesity ([place])    Obesity    Pneumonia    walking pneumonia   Prostatitis     Pulmonary embolism ([place])    Pulmonary hypertension (HCC)    Pyelonephritis    S/P aortic valve replacement with bioprosthetic valve 08/23/2017   25 mm Edwards Inspiris Resilia stented bovine pericardial tissue valve   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   24 mm Hemashield supracoronary straight graft    Sleep apnea    cpap    Thoracic ascending aortic aneurysm [facility])    Thoracic ascending aortic aneurysm (HCC)    Tobacco abuse     Patient Active Problem List   Diagnosis Date Noted   Rectal bleeding    Chronic blood loss anemia 11/12/2020   Gastrointestinal hemorrhage 10/08/2020   S/P placement of cardiac pacemaker 02/10/2020   Encounter for examination following treatment at hospital 02/10/2020   Anxiety 02/10/2020   DM2 (diabetes mellitus, type 2) ([place]) 02/10/2020   OSA on CPAP    Abnormal liver function 03/16/2019   History of pulmonary embolism 03/16/2019   Paroxysmal atrial fibrillation ([place]) 09/05/2018   COPD (chronic obstructive pulmonary disease) ([place]) 05/07/2018   Gastroesophageal reflux disease    Pulmonary embolism ([place]) 09/18/2017   Encounter for therapeutic drug monitoring 09/04/2017   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 08/23/2017   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   Pulmonary hypertension ([place])  Chronic periodontitis 07/18/2017   Morbid obesity ([place])    Essential hypertension    Tobacco abuse    Chronic heart failure with preserved ejection fraction (HFpEF) ([place])    Chronic venous insufficiency     Past Surgical History:  Procedure Laterality Date   AORTIC VALVE REPLACEMENT N/A 08/23/2017   Procedure: AORTIC VALVE REPLACEMENT (AVR) USING INSPIRIS RESILIA AORTIC VALVE SIZE 25 MM;  Surgeon: Rexene Alberts, MD;  Location: [facility];  Service: Open Heart Surgery;  Laterality: N/A;   CARDIAC VALVE REPLACEMENT N/A    Phreesia 02/07/2020   COLONOSCOPY WITH PROPOFOL N/A 11/14/2020    Procedure: COLONOSCOPY WITH PROPOFOL;  Surgeon: Gatha Mayer, MD;  Location: [facility] ENDOSCOPY;  Service: Endoscopy;  Laterality: N/A;   ESOPHAGOGASTRODUODENOSCOPY (EGD) WITH PROPOFOL N/A 11/14/2020   Procedure: ESOPHAGOGASTRODUODENOSCOPY (EGD) WITH PROPOFOL;  Surgeon: Gatha Mayer, MD;  Location: [facility];  Service: Endoscopy;  Laterality: N/A;   MULTIPLE EXTRACTIONS WITH ALVEOLOPLASTY N/A 07/23/2017   Procedure: Extraction of tooth #10 with alveoloplasty and gross debridement of remaining teeth;  Surgeon: Lenn Cal, DDS;  Location: [facility];  Service: Oral Surgery;  Laterality: N/A;   PACEMAKER IMPLANT N/A 08/28/2017    [employer] Jude [employer]urity MRI conditional  dual-chamber pacemaker for symptomatic complete heart blockby Dr Rayann Heman   TEE WITHOUT CARDIOVERSION N/A 07/16/2017   Procedure: TRANSESOPHAGEAL ECHOCARDIOGRAM (TEE);  Surgeon: Lelon Perla, MD;  Location: [facility] ENDOSCOPY;  Service: Cardiovascular;  Laterality: N/A;   TEE WITHOUT CARDIOVERSION N/A 08/23/2017   Procedure: TRANSESOPHAGEAL ECHOCARDIOGRAM (TEE);  Surgeon: Rexene Alberts, MD;  Location: [facility];  Service: Open Heart Surgery;  Laterality: N/A;   THORACIC AORTIC ANEURYSM REPAIR N/A 08/23/2017   Procedure: THORACIC ASCENDING ANEURYSM REPAIR (AAA) USING HEMASHIELD GOLD KNITTED MICROVEL DOUBLE VELOUR VASCULAR GRAFT D: 24 MM  L: 30 CM;  Surgeon: Rexene Alberts, MD;  Location: [facility];  Service: Open Heart Surgery;  Laterality: N/A;       Family History  Problem Relation Age of Onset   Hypertension Mother    Alzheimer's disease Mother    Heart attack Mother    Heart attack Brother    Diabetes Brother     Social History   Tobacco Use   Smoking status: Former    Packs/day: 2.00    Years: 16.00    Pack years: 32.00    Types: Cigarettes    Quit date: 08/23/2017    Years since quitting: 3.4   Smokeless tobacco: Never  Vaping Use   Vaping Use: Never used  Substance Use Topics   Alcohol use: No    Comment:  have had alcohol in the past, not heavy   Drug use: No    Home Medications Prior to Admission medications   Medication Sig Start Date End Date Taking? Authorizing Provider  hydrOXYzine (ATARAX/VISTARIL) 25 MG tablet Take 1 tablet (25 mg total) by mouth every 6 (six) hours. 02/02/21  Yes Corinthian Mizrahi S, PA-C  acetaminophen (TYLENOL) 325 MG tablet Take 650 mg by mouth every 6 (six) hours as needed for mild pain.    [provider]  albuterol (PROVENTIL) (2.5 MG/3ML) 0.083% nebulizer solution INHALE 1 VIAL VIA NEBULIZER EVERY 6 HOURS AS NEEDED FOR WHEEZING OR SHORTNESS OF BREATH 02/01/21   [name], MD  albuterol (VENTOLIN HFA) 108 (90 Base) MCG/ACT inhaler INHALE 2 PUFFS INTO THE LUNGS EVERY 6 (SIX) HOURS AS NEEDED FOR WHEEZING OR SHORTNESS OF BREATH. 12/10/20   Posey Pronto, Colin Broach,  MD  allopurinol (ZYLOPRIM) 300 MG tablet TAKE 1 TABLET EVERY DAY 11/11/20   [name], MD  ammonium lactate (AMLACTIN) 12 % lotion Apply 1 application topically as needed for dry skin. 10/13/20   Felipa Furnace, DPM  atorvastatin (LIPITOR) 40 MG tablet TAKE 1 TABLET EVERY DAY 09/20/20   [name], MD  benzonatate (TESSALON) 100 MG capsule Take 1 capsule (100 mg total) by mouth 3 (three) times daily as needed for cough. 01/27/21   [name], MD  Blood Glucose Monitoring Suppl (TRUE METRIX METER) w/Device KIT USE AS DIRECTED 08/19/20   [name], MD  buPROPion (WELLBUTRIN XL) 150 MG 24 hr tablet Take 1 tablet (150 mg total) by mouth daily. 01/18/21   [name], MD  buPROPion (WELLBUTRIN XL) 300 MG 24 hr tablet Take 1 tablet (300 mg total) by mouth daily. Patient not taking: Reported on 02/01/2021 02/17/21   [name], MD  diclofenac Sodium (VOLTAREN) 1 % GEL Apply 2 g topically 4 (four) times daily. 10/26/20   Mordecai Rasmussen, MD  docusate sodium (COLACE) 100 MG capsule Take 2 capsules (200 mg total) by mouth 2 (two) times daily. 11/15/20   Thurnell Lose, MD  empagliflozin  (JARDIANCE) 10 MG TABS tablet Take 1 tablet (10 mg total) by mouth daily before breakfast. 01/12/21   Ahmed Prima, Fransisco Hertz, PA-C  FEROSUL 325 (65 Fe) MG tablet TAKE 1 TABLET EVERY DAY 12/23/20   [name], MD  fexofenadine (ALLEGRA) 180 MG tablet Take 180 mg by mouth daily.    [provider]  gabapentin (NEURONTIN) 100 MG capsule Take 1 capsule in the morning and 2 capsules in the evening. 11/15/20   Thurnell Lose, MD  Krill Oil 350 MG CAPS Take 1 capsule by mouth in the morning and at bedtime.     [provider]  lisinopril (ZESTRIL) 20 MG tablet Take 1 tablet (20 mg total) by mouth daily. 04/21/20   Strader, Fransisco Hertz, PA-C  loratadine (CLARITIN) 10 MG tablet Take 10 mg by mouth daily as needed for allergies.    [provider]  metFORMIN (GLUCOPHAGE) 850 MG tablet Take 1 tablet (850 mg total) by mouth 2 (two) times daily with a meal. 10/08/20   Posey Pronto, Colin Broach, MD  metolazone (ZAROXOLYN) 5 MG tablet TAKE 1 TABLET (5 MG TOTAL) BY MOUTH THREE TIMES A WEEK. Patient not taking: Reported on 02/01/2021 01/26/21   Thompson Grayer, MD  metoprolol tartrate (LOPRESSOR) 100 MG tablet TAKE 1 TABLET TWICE DAILY 12/27/20   Josue Hector, MD  pantoprazole (PROTONIX) 40 MG tablet Take 1 tablet (40 mg total) by mouth daily. 07/14/20   [name], MD  penicillin v potassium (VEETID) 500 MG tablet Take 1 tablet (500 mg total) by mouth 4 (four) times daily for 7 days. 02/02/21 02/09/21  Carel Carrier S, PA-C  polyethylene glycol (MIRALAX) 17 g packet Take 17 g by mouth daily as needed. 11/15/20   Thurnell Lose, MD  potassium chloride SA (KLOR-CON) 20 MEQ tablet TAKE 3 TABS DAILY AND TAKE AN ADDITIONAL 1 TAB ON THE DAY YOU TAKE YOUR WEEKLY METOLAZONE DOSE Patient not taking: Reported on 02/01/2021 12/22/20   Ahmed Prima, Fransisco Hertz, PA-C  rivaroxaban (XARELTO) 20 MG TABS tablet TAKE 1 Tablet BY MOUTH ONCE EVERY DAY WITH SUPPER 10/20/20   Josue Hector, MD  Semaglutide (RYBELSUS) 14 MG TABS  Take 14 mg by mouth in the morning. Patient  not taking: Reported on 02/01/2021    [provider]  terbinafine (LAMISIL) 250 MG tablet Take 1 tablet by mouth once daily 01/25/21   [name], MD  torsemide (DEMADEX) 20 MG tablet Take 40 mg in the AM and Take 20 mg in the PM Patient not taking: Reported on 02/01/2021 12/21/20   Ahmed Prima, Fransisco Hertz, PA-C  TRUE METRIX BLOOD GLUCOSE TEST test strip TEST ONE TIME DAILY FOR DIABETES 10/25/20   [name], MD  TRUEplus Lancets 33G MISC USE TO TEST ONE TIME DAILY FOR DIABETES 10/25/20   [name], MD    Allergies    Patient has no known allergies.  Review of Systems   Review of Systems  Constitutional:  Negative for fever.  HENT:  Positive for congestion, rhinorrhea and sore throat.   Eyes:  Negative for visual disturbance.  Respiratory:  Negative for cough and shortness of breath.   Cardiovascular:  Negative for chest pain.  Gastrointestinal:  Negative for abdominal pain, constipation, diarrhea, nausea and vomiting.  Genitourinary:  Negative for dysuria and hematuria.  Musculoskeletal:  Negative for back pain.  Skin:  Negative for rash.  Neurological:  Negative for headaches.  All other systems reviewed and are negative.  Physical Exam Updated Vital Signs BP (!) 146/78   Pulse 88   Temp 98 F (36.7 C)   Resp 16   Ht _0  (1.803 m)   Wt (!) 221.3 kg   SpO2 98%   BMI 68.03 kg/m   Physical Exam Vitals and nursing note reviewed.  Constitutional:      Appearance: He is well-developed.  HENT:     Head: Normocephalic and atraumatic.     Mouth/Throat:     Pharynx: Oropharynx is clear. No oropharyngeal exudate, posterior oropharyngeal erythema or uvula swelling.     Tonsils: No tonsillar exudate or tonsillar abscesses. 0 on the right. 0 on the left.  Eyes:     Conjunctiva/sclera: Conjunctivae normal.  Cardiovascular:     Rate and Rhythm: Normal rate and regular rhythm.     Heart sounds: No murmur  heard. Pulmonary:     Effort: Pulmonary effort is normal. No respiratory distress.     Breath sounds: Normal breath sounds.  Abdominal:     Palpations: Abdomen is soft.     Tenderness: There is no abdominal tenderness.  Musculoskeletal:     Cervical back: Neck supple.  Skin:    General: Skin is warm and dry.  Neurological:     Mental Status: He is alert.    ED Results / Procedures / Treatments   Labs (all labs ordered are listed, but only abnormal results are displayed) Labs Reviewed  CBC WITH DIFFERENTIAL/PLATELET - Abnormal; Notable for the following components:      Result Value   WBC 12.6 (*)    Hemoglobin 11.5 (*)    HCT 37.3 (*)    RDW 19.9 (*)    Neutro Abs 8.9 (*)    Abs Immature Granulocytes 0.22 (*)    All other components within normal limits  BASIC METABOLIC PANEL - Abnormal; Notable for the following components:   Chloride 96 (*)    Glucose, Bld 152 (*)    BUN 33 (*)    Creatinine, Ser 1.39 (*)    All other components within normal limits  RESP PANEL BY RT-PCR (FLU A&B, COVID) ARPGX2  CULTURE, GROUP A STREP [facility])    EKG None  Radiology No results found.  Procedures  Procedures   Medications Ordered in ED Medications  lidocaine (XYLOCAINE) 2 % viscous mouth solution 15 mL (15 mLs Mouth/Throat Given 02/02/21 1750)    ED Course  I have reviewed the triage vital signs and the nursing notes.  Pertinent labs & imaging results that were available during my care of the patient were reviewed by me and considered in my medical decision making (see chart for details).    MDM Rules/Calculators/A&P                          50 year old male here with complaints of sore throat, nasal congestion and also requesting repeat blood work given his AKI last week.  His COVID test is negative.  Strep culture was sent and is pending at the time of discharge.  Patient given Rx for penicillin and he will follow-up on his MyChart if this is positive he will start the  antibiotics.  I do think that his symptoms are most likely related to allergies given the duration of symptoms and pattern.  He already states that he has bought fluticasone, Mucinex as well as throat lozenges and throat spray for home and is on Allegra as well.  Advised him to continue this medication.  Also advised on hydration.  We did check his laboratory work and his creatinine tested appear to be significantly improving.  He has a chronic leukocytosis of unclear etiology and he understands he needs to follow-up with his PCP about this.  He also has significant anxiety therefore we will give him prescription for low-dose hydroxyzine and he will need to address his anxiety with his PCP later this week.  He denies SI and I do not think an emergent psychiatric evaluation is needed at this time.  His overall work-up is reassuring and I do not feel that he requires any further work-up or admission in the hospital at this time.  Feel he is appropriate for discharge home for outpatient follow-up.   Final Clinical Impression(s) / ED Diagnoses Final diagnoses:  Sore throat    Rx / DC Orders ED Discharge Orders          Ordered    penicillin v potassium (VEETID) 500 MG tablet  4 times daily,   Status:  Discontinued        02/02/21 1929    hydrOXYzine (ATARAX/VISTARIL) 25 MG tablet  Every 6 hours        02/02/21 1929    penicillin v potassium (VEETID) 500 MG tablet  4 times daily        02/02/21 1935             [facility] [place] 02/02/21 2032    Margette Fast, MD 02/06/21 1752

## 2021-02-02 NOTE — Discharge Instructions (Addendum)
I suspect your symptoms are related to allergies.  Your COVID and flu test were negative.  A culture was obtained of your throat to check for strep.  If this is positive you will need to fill the prescription for antibiotics and start taking them.  I would recommend taking the Mucinex that you bought over-the-counter as well as using the throat lozenges, throat spray and Flonase.   Prescription given for Hydroxyzine for your anxiety. Take medication as directed and do not operate machinery, drive a car, or work while taking this medication as it can make you drowsy.    Please keep your appointment with your regular doctor and return to the emergency department for any new or worsening symptoms the meantime.

## 2021-02-02 NOTE — ED Triage Notes (Signed)
Pt presents to ED with complaints of sore throat, headache started 2 weeks ago. Pt states he is always SOB but it hasn't gotten worse.

## 2021-02-03 ENCOUNTER — Other Ambulatory Visit: Payer: Self-pay | Admitting: Internal Medicine

## 2021-02-03 ENCOUNTER — Telehealth: Payer: Self-pay | Admitting: Student

## 2021-02-03 ENCOUNTER — Ambulatory Visit: Payer: Medicare HMO | Admitting: *Deleted

## 2021-02-03 ENCOUNTER — Telehealth: Payer: Self-pay

## 2021-02-03 ENCOUNTER — Ambulatory Visit: Payer: Medicare HMO | Admitting: Pharmacist

## 2021-02-03 DIAGNOSIS — F419 Anxiety disorder, unspecified: Secondary | ICD-10-CM

## 2021-02-03 DIAGNOSIS — E1169 Type 2 diabetes mellitus with other specified complication: Secondary | ICD-10-CM

## 2021-02-03 DIAGNOSIS — I480 Paroxysmal atrial fibrillation: Secondary | ICD-10-CM

## 2021-02-03 DIAGNOSIS — J449 Chronic obstructive pulmonary disease, unspecified: Secondary | ICD-10-CM

## 2021-02-03 DIAGNOSIS — F339 Major depressive disorder, recurrent, unspecified: Secondary | ICD-10-CM

## 2021-02-03 DIAGNOSIS — I5032 Chronic diastolic (congestive) heart failure: Secondary | ICD-10-CM

## 2021-02-03 DIAGNOSIS — I10 Essential (primary) hypertension: Secondary | ICD-10-CM

## 2021-02-03 MED ORDER — HYDROXYZINE HCL 25 MG PO TABS: 25.0000 mg | ORAL_TABLET | Freq: Three times a day (TID) | ORAL | 2 refills | Status: DC | PRN

## 2021-02-03 MED ORDER — SPIRIVA RESPIMAT 2.5 MCG/ACT IN AERS: 2.0000 | INHALATION_SPRAY | Freq: Every day | RESPIRATORY_TRACT | 5 refills | Status: AC

## 2021-02-03 NOTE — Telephone Encounter (Signed)
Pt c/o swelling: STAT is pt has developed SOB within 24 hours  If swelling, where is the swelling located? All over  How much weight have you gained and in what time span? Approximately 20lbs  in a month ?   Have you gained 3 pounds in a day or 5 pounds in a week?  He was 488 when he was in ER yesterday , and they said he was dehydrated and his bp was low.  9/12 he was 480 that is 8lbs in  3 days , after starting fluid pills again yesterday he dropped to 484 today    Are you currently taking a fluid pill? Just started taking them again yesterday, has been in the hospital  and they said to call him  Are you currently SOB? Always   Have you traveled recently? No  Have patient on the schedule for tomorrow

## 2021-02-03 NOTE — Chronic Care Management (AMB) (Signed)
Chronic Care Management   CCM RN Visit Note  02/03/2021 Name: Reginald [name] MRN: [id number] DOB: 09-26-1970  Subjective: Reginald [name] is a 50 y.o. year old male who is a primary care patient of [name], MD. The care management team was consulted for assistance with disease management and care coordination needs.    Engaged with patient by telephone for follow up visit in response to provider referral for case management and/or care coordination services.   Consent to Services:  The patient was given information about Chronic Care Management services, agreed to services, and gave verbal consent prior to initiation of services.  Please see initial visit note for detailed documentation.   Patient agreed to services and verbal consent obtained.   Assessment: Review of patient past medical history, allergies, medications, health status, including review of consultants reports, laboratory and other test data, was performed as part of comprehensive evaluation and provision of chronic care management services.   SDOH (Social Determinants of Health) assessments and interventions performed:    CCM Care Plan  No Known Allergies  Outpatient Encounter Medications as of 02/03/2021  Medication Sig   acetaminophen (TYLENOL) 325 MG tablet Take 650 mg by mouth every 6 (six) hours as needed for mild pain.   albuterol (PROVENTIL) (2.5 MG/3ML) 0.083% nebulizer solution INHALE 1 VIAL VIA NEBULIZER EVERY 6 HOURS AS NEEDED FOR WHEEZING OR SHORTNESS OF BREATH   albuterol (VENTOLIN HFA) 108 (90 Base) MCG/ACT inhaler INHALE 2 PUFFS INTO THE LUNGS EVERY 6 (SIX) HOURS AS NEEDED FOR WHEEZING OR SHORTNESS OF BREATH.   allopurinol (ZYLOPRIM) 300 MG tablet TAKE 1 TABLET EVERY DAY   ammonium lactate (AMLACTIN) 12 % lotion Apply 1 application topically as needed for dry skin.   atorvastatin (LIPITOR) 40 MG tablet TAKE 1 TABLET EVERY DAY   benzonatate (TESSALON) 100 MG capsule Take 1 capsule (100 mg total) by  mouth 3 (three) times daily as needed for cough.   Blood Glucose Monitoring Suppl (TRUE METRIX METER) w/Device KIT USE AS DIRECTED   buPROPion (WELLBUTRIN XL) 150 MG 24 hr tablet Take 1 tablet (150 mg total) by mouth daily.   diclofenac Sodium (VOLTAREN) 1 % GEL Apply 2 g topically 4 (four) times daily.   docusate sodium (COLACE) 100 MG capsule Take 2 capsules (200 mg total) by mouth 2 (two) times daily.   empagliflozin (JARDIANCE) 10 MG TABS tablet Take 1 tablet (10 mg total) by mouth daily before breakfast.   FEROSUL 325 (65 Fe) MG tablet TAKE 1 TABLET EVERY DAY   fexofenadine (ALLEGRA) 180 MG tablet Take 180 mg by mouth daily.   gabapentin (NEURONTIN) 100 MG capsule Take 1 capsule in the morning and 2 capsules in the evening.   hydrOXYzine (ATARAX/VISTARIL) 25 MG tablet Take 1 tablet (25 mg total) by mouth every 6 (six) hours.   Krill Oil 350 MG CAPS Take 1 capsule by mouth in the morning and at bedtime.    lisinopril (ZESTRIL) 20 MG tablet Take 1 tablet (20 mg total) by mouth daily.   loratadine (CLARITIN) 10 MG tablet Take 10 mg by mouth daily as needed for allergies.   metFORMIN (GLUCOPHAGE) 850 MG tablet Take 1 tablet (850 mg total) by mouth 2 (two) times daily with a meal.   metolazone (ZAROXOLYN) 5 MG tablet TAKE 1 TABLET (5 MG TOTAL) BY MOUTH THREE TIMES A WEEK.   metoprolol tartrate (LOPRESSOR) 100 MG tablet TAKE 1 TABLET TWICE DAILY   pantoprazole (PROTONIX) 40 MG tablet  Take 1 tablet (40 mg total) by mouth daily.   polyethylene glycol (MIRALAX) 17 g packet Take 17 g by mouth daily as needed.   potassium chloride SA (KLOR-CON) 20 MEQ tablet TAKE 3 TABS DAILY AND TAKE AN ADDITIONAL 1 TAB ON THE DAY YOU TAKE YOUR WEEKLY METOLAZONE DOSE   rivaroxaban (XARELTO) 20 MG TABS tablet TAKE 1 Tablet BY MOUTH ONCE EVERY DAY WITH SUPPER   terbinafine (LAMISIL) 250 MG tablet Take 1 tablet by mouth once daily   torsemide (DEMADEX) 20 MG tablet Take 40 mg in the AM and Take 20 mg in the PM   TRUE  METRIX BLOOD GLUCOSE TEST test strip TEST ONE TIME DAILY FOR DIABETES   TRUEplus Lancets 33G MISC USE TO TEST ONE TIME DAILY FOR DIABETES   [START ON 02/17/2021] buPROPion (WELLBUTRIN XL) 300 MG 24 hr tablet Take 1 tablet (300 mg total) by mouth daily. (Patient not taking: No sig reported)   penicillin v potassium (VEETID) 500 MG tablet Take 1 tablet (500 mg total) by mouth 4 (four) times daily for 7 days. (Patient not taking: Reported on 02/03/2021)   Semaglutide (RYBELSUS) 14 MG TABS Take 14 mg by mouth in the morning. (Patient not taking: No sig reported)   No facility-administered encounter medications on file as of 02/03/2021.    Patient Active Problem List   Diagnosis Date Noted   Rectal bleeding    Chronic blood loss anemia 11/12/2020   Gastrointestinal hemorrhage 10/08/2020   S/P placement of cardiac pacemaker 02/10/2020   Encounter for examination following treatment at hospital 02/10/2020   Anxiety 02/10/2020   DM2 (diabetes mellitus, type 2) ([place]) 02/10/2020   OSA on CPAP    Abnormal liver function 03/16/2019   History of pulmonary embolism 03/16/2019   Paroxysmal atrial fibrillation ([place]) 09/05/2018   COPD (chronic obstructive pulmonary disease) ([place]) 05/07/2018   Gastroesophageal reflux disease    Pulmonary embolism ([place]) 09/18/2017   Encounter for therapeutic drug monitoring 09/04/2017   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 08/23/2017   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   Pulmonary hypertension ([place])    Chronic periodontitis 07/18/2017   Morbid obesity ([place])    Essential hypertension    Tobacco abuse    Chronic heart failure with preserved ejection fraction (HFpEF) ([place])    Chronic venous insufficiency     Conditions to be addressed/monitored:CHF and COPD  Care Plan : Heart Failure (Adult)  Updates made by Kassie Mends, RN since 02/03/2021 12:00 AM     Problem: Symptom Exacerbation (Heart  Failure)   Priority: High     Long-Range Goal: Symptom Exacerbation Prevented or Minimized   Start Date: 12/07/2020  Expected End Date: 06/09/2021  This Visit's Progress: Not on track  Recent Progress: On track  Priority: High  Note:   Current Barriers:  Knowledge deficit related to basic heart failure pathophysiology and self care management- needs reinforcement for HF action plan, symptom management Does not adhere to provider recommendations re: low sodium diet, sometimes eats fast food, does not exercise, feels he is not able, stays tired, pt reports he checks CBG once daily with most recent AIC 5.8.  Checks blood pressure on occasion, has had no further GI bleeding (pt states it was due to hemorrhoids) Patient does continue to weigh daily with weight today 484  pounds which is 24 pound weight gain in one month.  Pt has not been keeping in touch with cardiologist about  weight gain. Patient reports he still has anxiety and is working with [facility], continues working with pharmacist for any medication related needs.  Pt reports he has all medications and taking as prescribed, was told to discontinue diuretics due to "dehydration and now they told me to take them again"   Went to ED yesterday for sore throat and has prescription for penicillin and awaiting results of strep test before taking.  Lacks social connections- pt lives alone, has a friend he can call if needed, does not communicate much with his family, gets out and drives to the grocery store and flea market to talk with friends, pt continues to work with LCSW for depression management. Case Manager Clinical Goal(s):  patient will weigh self daily and record patient will verbalize understanding of Heart Failure Action Plan and when to call doctor patient will take all Heart Failure mediations as prescribed patient will weigh daily and record (notifying MD of 3 lb weight gain over night or 5 lb in a week) Interventions:  Collaboration with  [name], MD regarding development and update of comprehensive plan of care as evidenced by provider attestation and co-signature Inter-disciplinary care team collaboration (see longitudinal plan of care) Reviewed education on low sodium diet  Reinforced in depth Heart Failure Action Plan with emphasis on yellow zone and importance of staying in touch with doctor Reviewed with patient to weigh each morning after emptying bladder Reviewed importance of daily weight and advised patient to weigh and record daily Reviewed role of diuretics in prevention of fluid overload and management of heart failure Reinforced importance of taking all medications, using inhalers as prescribed Reviewed medication list with patient Reviewed importance of continuing to work with LCSW related to depression Talk to your doctor about referral to outpatient counseling if you feel this would be helpful Reinforced patient to stay active, try to connect with friends and be as involved with others, community as possible, get outside daily and encouraged to walk if able even if only for a few minutes Reinforced with patient importance of reporting any GI bleeding to doctor Reviewed all upcoming provider appointments- cardiologist 11/29,  primary care provider 9/16, annual wellness visit 10/24 In basket message sent to primary care provider Dr. Posey Pronto and cardiologist Bernerd Pho reporting 24 pound weight gain in one month Self-Care Activities: Attends all scheduled provider appointments Attends church or other social activities Calls provider office for new concerns or questions Patient Goals: - Important- call cardiologist office if I gain more than 3 pounds in one day or 5 pounds in one week- follow heart failure action plan, know how you are feeling each day - do ankle pumps when sitting - keep legs up while sitting/ watching TV - continue to weigh daily and write weight in diary - follow low salt diet-  avoid salty snacks and fast food - watch for swelling in feet, ankles and legs every day - take all medications as prescribed - keep in touch with your doctor about weight gain, swelling Follow Up Plan: Telephone follow up appointment with care management team member scheduled for:  03/03/2021     Plan:Telephone follow up appointment with care management team member scheduled for:  03/03/2021  Jacqlyn Larsen [facility], BSN RN Case Manager [facility] [phone]

## 2021-02-03 NOTE — Chronic Care Management (AMB) (Addendum)
Chronic Care Management Pharmacy Note  02/03/2021 Name:  Reginald [name] MRN:  [id number] DOB:  01/24/1971  Summary:  Patient confirmed receipt of denial letter from [facility] Extra Help application. Patient dropped off at the office for me on 02/04/21. Copy made and faxed to [facility] for Jardiance patient assistance at 11:38 AM on 02/04/21.  Given patient's severity of shortness of breath, may consider maintenance inhaler for COPD such as Spiriva which can be obtained through [place] patient assistance program if PCP agrees.  Patient recently started on hydroxyzine 25 mg by mouth every 6 hours as needed for anxiety. Patient reports that this is working well for him so far. He has not been tearful like he was before. Since this prescription was from the ED, will need refill by PCP. Will recommend that we continue this along with bupropion.   Subjective: Reginald [name] is an 49 y.o. year old male who is a primary patient of [name], MD.  The CCM team was consulted for assistance with disease management and care coordination needs.    Engaged with patient by telephone for follow up visit in response to provider referral for pharmacy case management and/or care coordination services.   Consent to Services:  The patient was given information about Chronic Care Management services, agreed to services, and gave verbal consent prior to initiation of services.  Please see initial visit note for detailed documentation.   Patient Care Team: [name], MD as PCP - General (Internal Medicine) Thompson Grayer, MD as PCP - Electrophysiology (Cardiology) Josue Hector, MD as PCP - Cardiology (Cardiology) Kassie Mends, RN as [place] Management [place], Ranae Pila, LCSW as Social Worker (Licensed Clinical Social Worker) Beryle Lathe, [facility] (Pharmacist)  Objective:  Lab Results  Component Value Date   CREATININE 1.39 (H) 02/02/2021   CREATININE 2.13 (H)  01/30/2021   CREATININE 1.18 01/05/2021    Lab Results  Component Value Date   HGBA1C 5.4 11/12/2020   Last diabetic Eye exam: No results found for: HMDIABEYEEXA  Last diabetic Foot exam: No results found for: HMDIABFOOTEX      Component Value Date/Time   CHOL 123 06/10/2020 0837   TRIG 281 (H) 06/10/2020 0837   HDL 25 (L) 06/10/2020 0837   CHOLHDL 4.9 06/10/2020 0837   CHOLHDL 6.6 01/01/2020 1014   VLDL 73 (H) 01/01/2020 1014   [place] 54 06/10/2020 0837    Hepatic Function Latest Ref Rng & Units 11/24/2020 11/15/2020 11/14/2020  Total Protein 6.0 - 8.5 g/dL 7.9 8.0 7.8  Albumin 4.0 - 5.0 g/dL 4.3 3.6 3.6  AST 0 - 40 IU/L 41(H) 32 30  ALT 0 - 44 IU/L '27 23 23  ' Alk Phosphatase 44 - 121 IU/L 98 76 72  Total Bilirubin 0.0 - 1.2 mg/dL 0.7 1.4(H) 1.2  Bilirubin, Direct 0.0 - 0.2 mg/dL - - -    Lab Results  Component Value Date/Time   TSH 4.060 10/04/2020 09:43 AM   TSH 5.240 (H) 06/10/2020 08:37 AM   FREET4 1.30 10/04/2020 09:43 AM   FREET4 0.79 (L) 05/03/2018 02:04 PM    CBC Latest Ref Rng & Units 02/02/2021 01/30/2021 01/05/2021  WBC 4.0 - 10.5 K/uL 12.6(H) 12.5(H) 10.7(H)  Hemoglobin 13.0 - 17.0 g/dL 11.5(L) 11.4(L) 10.5(L)  Hematocrit 39.0 - 52.0 % 37.3(L) 37.8(L) 35.3(L)  Platelets 150 - 400 K/uL 290 302 261    No results found for: VD25OH  Clinical ASCVD: No  The ASCVD Risk score (Arnett DK, et al., 2019) failed to calculate for the following reasons:   The valid total cholesterol range is 130 to 320 mg/dL    Social History   Tobacco Use  Smoking Status Former   Packs/day: 2.00   Years: 16.00   Pack years: 32.00   Types: Cigarettes   Quit date: 08/23/2017   Years since quitting: 3.4  Smokeless Tobacco Never   BP Readings from Last 3 Encounters:  02/02/21 (!) 146/78  01/30/21 129/80  01/21/21 130/80   Pulse Readings from Last 3 Encounters:  02/02/21 88  01/30/21 77  01/21/21 70   Wt Readings from Last 3 Encounters:  02/02/21 (!) 487 lb 12.8 oz  (221.3 kg)  01/31/21 (!) 480 lb (217.7 kg)  01/30/21 (!) 470 lb (213.2 kg)    Assessment: Review of patient past medical history, allergies, medications, health status, including review of consultants reports, laboratory and other test data, was performed as part of comprehensive evaluation and provision of chronic care management services.   SDOH:  (Social Determinants of Health) assessments and interventions performed:    CCM Care Plan  No Known Allergies  Medications Reviewed Today     Reviewed by Beryle Lathe, [facility] (Pharmacist) on 02/03/21 at 1357  Med List Status: <None>   Medication Order Taking? Sig Documenting Provider Last Dose Status Informant  acetaminophen (TYLENOL) 325 MG tablet [id number] Yes Take 650 mg by mouth every 6 (six) hours as needed for mild pain. [provider] Taking Active Self  albuterol (PROVENTIL) (2.5 MG/3ML) 0.083% nebulizer solution [id number] Yes INHALE 1 VIAL VIA NEBULIZER EVERY 6 HOURS AS NEEDED FOR WHEEZING OR SHORTNESS OF BREATH [name], MD Taking Active   albuterol (VENTOLIN HFA) 108 (90 Base) MCG/ACT inhaler [id number] Yes INHALE 2 PUFFS INTO THE LUNGS EVERY 6 (SIX) HOURS AS NEEDED FOR WHEEZING OR SHORTNESS OF BREATH. [name], MD Taking Active            Med Note Lia Hopping, Toniann Fail   Fri Jan 21, 2021  8:16 AM)    allopurinol (ZYLOPRIM) 300 MG tablet [id number] Yes TAKE 1 TABLET EVERY DAY [name], MD Taking Active Self  ammonium lactate (AMLACTIN) 12 % lotion [id number] Yes Apply 1 application topically as needed for dry skin. Felipa Furnace, DPM Taking Active Self  atorvastatin (LIPITOR) 40 MG tablet [id number] Yes TAKE 1 TABLET EVERY DAY [name], MD Taking Active Self  benzonatate (TESSALON) 100 MG capsule [id number] No Take 1 capsule (100 mg total) by mouth 3 (three) times daily as needed for cough. [name], MD Unknown Active   Blood Glucose Monitoring Suppl (TRUE METRIX METER) w/Device Drucie Opitz [id number]   USE AS DIRECTED [name], MD  Active Self  buPROPion (WELLBUTRIN XL) 150 MG 24 hr tablet [id number] Yes Take 1 tablet (150 mg total) by mouth daily. [name], MD Taking Active   buPROPion (WELLBUTRIN XL) 300 MG 24 hr tablet [id number] No Take 1 tablet (300 mg total) by mouth daily.  Patient not taking: No sig reported   [name], MD Not Taking Active   diclofenac Sodium (VOLTAREN) 1 % GEL [id number] Yes Apply 2 g topically 4 (four) times daily. Mordecai Rasmussen, MD Taking Active Self  docusate sodium (COLACE) 100 MG capsule [id number] Yes Take 2 capsules (200 mg total) by mouth 2 (two) times daily. Thurnell Lose, MD Taking Active   empagliflozin (JARDIANCE) 10 MG TABS  tablet [id number] Yes Take 1 tablet (10 mg total) by mouth daily before breakfast. Ahmed Prima Fransisco Hertz, PA-C Taking Active            Med Note Jim Like Feb 03, 2021  1:56 PM) Currently has samples from his cardiology provider  FEROSUL 325 (65 Fe) MG tablet [id number] Yes TAKE 1 TABLET EVERY DAY [name], MD Taking Active   fexofenadine (ALLEGRA) 180 MG tablet [id number] Yes Take 180 mg by mouth daily. [provider] Taking Active   gabapentin (NEURONTIN) 100 MG capsule [id number] Yes Take 1 capsule in the morning and 2 capsules in the evening. Thurnell Lose, MD Taking Active   hydrOXYzine (ATARAX/VISTARIL) 25 MG tablet [id number] Yes Take 1 tablet (25 mg total) by mouth every 6 (six) hours. Couture, Cortni S, PA-C Taking Active   Krill Oil 350 MG CAPS [id number] Yes Take 1 capsule by mouth in the morning and at bedtime.  [provider] Taking Active Self           Med Note Lia Hopping, MINDY L   Fri Jan 21, 2021  8:16 AM)    lisinopril (ZESTRIL) 20 MG tablet [id number] Yes Take 1 tablet (20 mg total) by mouth daily. Erma Heritage, PA-C Taking Active Self  loratadine (CLARITIN) 10 MG tablet [id number] No Take 10 mg by mouth daily as needed for allergies.  Patient not  taking: Reported on 02/03/2021   [provider] Not Taking Active   metFORMIN (GLUCOPHAGE) 850 MG tablet [id number] Yes Take 1 tablet (850 mg total) by mouth 2 (two) times daily with a meal. [name], MD Taking Active Self  metolazone (ZAROXOLYN) 5 MG tablet [id number] Yes TAKE 1 TABLET (5 MG TOTAL) BY MOUTH THREE TIMES A WEEK. Thompson Grayer, MD Taking Active   metoprolol tartrate (LOPRESSOR) 100 MG tablet [id number] Yes TAKE 1 TABLET TWICE DAILY Josue Hector, MD Taking Active   pantoprazole (PROTONIX) 40 MG tablet [id number] Yes Take 1 tablet (40 mg total) by mouth daily. [name], MD Taking Active Self  penicillin v potassium (VEETID) 500 MG tablet [id number] No Take 1 tablet (500 mg total) by mouth 4 (four) times daily for 7 days.  Patient not taking: No sig reported   Couture, Cortni S, PA-C Not Taking Active   polyethylene glycol (MIRALAX) 17 g packet [id number] Yes Take 17 g by mouth daily as needed. Thurnell Lose, MD Taking Active   potassium chloride SA (KLOR-CON) 20 MEQ tablet [id number] Yes TAKE 3 TABS DAILY AND TAKE AN ADDITIONAL 1 TAB ON THE DAY YOU TAKE YOUR WEEKLY METOLAZONE DOSE Ahmed Prima, Fransisco Hertz, PA-C Taking Active   rivaroxaban (XARELTO) 20 MG TABS tablet [id number] Yes TAKE 1 Tablet BY MOUTH ONCE EVERY DAY WITH SUPPER Josue Hector, MD Taking Active Self  Semaglutide (RYBELSUS) 14 MG TABS [id number] No Take 14 mg by mouth in the morning.  Patient not taking: No sig reported   [provider] Not Taking Active            Med Note Lia Hopping, MINDY L   Fri Jan 21, 2021  8:16 AM)    terbinafine (LAMISIL) 250 MG tablet [id number] Yes Take 1 tablet by mouth once daily [name], MD Taking Active   torsemide (DEMADEX) 20 MG tablet [id number] Yes Take 40 mg in the AM and Take 20 mg in the PM [place], K[place], PA-C Taking Active   TRUE METRIX BLOOD GLUCOSE  TEST test strip [id number]  TEST ONE TIME DAILY FOR DIABETES [name], MD  Active Self   TRUEplus Lancets 33G MISC [id number]  USE TO TEST ONE TIME DAILY FOR DIABETES [name], MD  Active Self            Patient Active Problem List   Diagnosis Date Noted   Rectal bleeding    Chronic blood loss anemia 11/12/2020   Gastrointestinal hemorrhage 10/08/2020   S/P placement of cardiac pacemaker 02/10/2020   Encounter for examination following treatment at hospital 02/10/2020   Anxiety 02/10/2020   DM2 (diabetes mellitus, type 2) ([place]) 02/10/2020   OSA on CPAP    Abnormal liver function 03/16/2019   History of pulmonary embolism 03/16/2019   Paroxysmal atrial fibrillation ([place]) 09/05/2018   COPD (chronic obstructive pulmonary disease) ([place]) 05/07/2018   Gastroesophageal reflux disease    Pulmonary embolism ([place]) 09/18/2017   Encounter for therapeutic drug monitoring 09/04/2017   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 08/23/2017   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   Pulmonary hypertension ([place])    Chronic periodontitis 07/18/2017   Morbid obesity ([place])    Essential hypertension    Tobacco abuse    Chronic heart failure with preserved ejection fraction (HFpEF) ([place])    Chronic venous insufficiency     Immunization History  Administered Date(s) Administered   Influenza Inj Mdck Quad Pf 02/27/2019   Influenza,inj,Quad PF,6+ Mos 02/10/2020   Janssen (J&J) SARS-COV-2 Vaccination 09/16/2019   PFIZER(Purple Top)SARS-COV-2 Vaccination 03/15/2020   Pneumococcal Polysaccharide-23 03/17/2019   Conditions to be addressed/monitored: Atrial Fibrillation, CHF, HTN, COPD, DMII, Anxiety, and Depression  Care Plan : Medication Management  Updates made by Beryle Lathe, [place] since 02/03/2021 12:00 AM     Problem: COPD, HFpEF, HTN, T2DM, Afib, anxiety/depression   Priority: High  Onset Date: 12/28/2020     Long-Range Goal: Disease Progression Prevention   Start Date: 12/28/2020  Expected End  Date: 03/28/2021  Recent Progress: On track  Priority: High  Note:   Current Barriers:  Unable to independently monitor therapeutic efficacy  Pharmacist Clinical Goal(s):  Over the next 90 days, patient will achieve adherence to monitoring guidelines and medication adherence to achieve therapeutic efficacy through collaboration with PharmD and provider.   Interventions: 1:1 collaboration with [name], MD regarding development and update of comprehensive plan of care as evidenced by provider attestation and co-signature Inter-disciplinary care team collaboration (see longitudinal plan of care) Comprehensive medication review performed; medication list updated in electronic medical record  Diabetes: Current medications: metformin 850 mg by mouth twice daily and Jardiance 10 mg by mouth daily (currently has samples from his cardiology provider) Semaglutide (Rybelsus) 14 mg refill in process with [employer]. Patient should receive soon. Intolerances: none Taking medications as directed: yes Side effects thought to be attributed to current medication regimen: GI upset with metformin but ok with continuing for now given benefits Patient denies hypoglycemia Current meal patterns: breakfast: eggs, oatmeal, chicken sandwhich, and baked beans ; lunch:  ribeye and [employer] Breast in air fryer ; dinner:  leftovers from lunch ; snacks: none; drinks: water, regular soda, milk, and sports drinks Has noticed decreased appetite since starting Rybelsus Current exercise: none Controlled; Most recent A1c at goal of <7% per ADA guidelines Current glucose readings: not discussed today Medication: Identify diabetes medication and when best taken Monitoring: target blood sugar range, when to test Hypoglycemia: I have discussed with  the patient how to treat hypoglycemia by the rule of 15; eat/drink 15g of sugar in the form of glucose tabs, 4 ounces of juice or soda and recheck fingerstick glucose  in 15 minutes. Chocolate bars and ice cream should be avoided because fat delays carbohydrate digestion and absorption. Retreat if glucose remains low. Driving should cease until glucose is normal. Continue metformin 850 mg by mouth twice daily, Jardiance 10 mg by mouth daily and semaglutide (Rybelsus) 14 mg by mouth at least 30 minutes before the first food, beverage, or other oral medications of the day with no more than 4 ounces of plain water only Patient confirmed receipt of denial letter from [facility] Extra Help application. Patient will drop off at the office for me to fax to [facility] for Jardiance patient assistance.  Instructed to monitor blood sugars once a day at the following times: fasting (at least 8 hours since last food consumption), 2 hours after dinner, and whenever patient experiences symptoms of hypo/hyperglycemia Encouraged regular aerobic exercise with a goal of 30 minutes five times per week (150 minutes per week)  Hypertension: Current medications: lisinopril 20 mg by mouth once daily (recently held briefly due to hypotension and acute kidney injury but patient was instructed to restart after visit with PCP on 01/31/21) Patient also takes diuretics (torsemide + metolazone as needed) Intolerances: none Taking medications as directed: yes Side effects thought to be attributed to current medication regimen: no Reports some orthostatic hypotension but knows to get up slowly Current exercise: none Home blood pressure readings: not checking currently Blood pressure under fair control. Factors affecting control of BP include high salt intake, elevated BMI, lack of exercise, sleep apnea, and volume overload. Blood pressure is at goal of <130/80 mmHg per 2017 AHA/ACC guidelines. Continue lisinopril 20 mg by mouth once daily Check BMP at next PCP visit Encourage dietary sodium restriction/DASH diet Recommend regular aerobic exercise Recommend home blood pressure monitoring, to bring  results in next visit Discussed need for and importance of continued work on weight loss Discussed need for medication compliance Reviewed risks of hypertension, principles of treatment and consequences of untreated hypertension Patient received CPAP on 01/20/21 which will likely improve blood pressure   Heart failure with preserved ejection fraction (LVEF >50% with evidence of spontaneous or provokable increased LV filling pressures): Appropriately managed; added SGLT2 inhibitor in August 2022 once serum creatinine improved  Has pacemaker Current treatment: lisinopril 20 mg by mouth once daily, metoprolol tartrate 100 mg by mouth twice daily, and empagliflozin (Jardiance) 10 mg by mouth once daily Torsemide and metolazone recently held due to acute kidney injury. Sees cardiology tomorrow.  Stage C (Symptomatic heart failure)/NYHA Class III (Marked limitation of physical activity. Comfortable at rest. Less than ordinary activity causes fatigue, palpitation, or dyspnea) Most recent echocardiogram was in March 2022. LVEF was stable at 60-65%. BNP: 67 (01/30/21) Current home vitals: not checking Current home weights: large fluctuations Reports shortness of breath with activity or when lying down, fatigue and weakness, swelling in the legs, ankles and feet, and reduced ability to exercise. Denies persistent cough or wheezing, nausea and lack of appetite, and chest pain Continue Jardiance 10 mg by mouth daily, lisinopril 20 mg by mouth once daily, and metoprolol tartrate 100 mg by mouth twice daily. Follow cardiology recommendations regarding torsemide and metolazone Consider transition from metoprolol tartrate to metoprolol succinate to decrease pill burden per patient request.  Continue to watch serum creatinine and potassium levels closely Encourage dietary sodium restriction (<  3 g/day) Educated on the importance of weighing daily. Patient aware to contact cardiology/primary care team if weight gain  >3 lbs in 1 day or >5 lbs in 1 week Recommend home blood pressure monitoring, to bring results in next visit Discussed need for and importance of continued work on weight loss Discussed need for medication compliance Recommend restricted fluid intake (eg, 2 L/day)  Chronic Obstructive Pulmonary Disease: Controlled per patient - only uses albuterol as needed Patient now has CPAP machine Stopped smoking 08/23/2017; smoked for 30 years (1 PPD) Current treatment: albuterol metered dose (ProAir, Ventolin, Proventil) 1 puff by mouth as needed for shortness of breath; only using ~2 times per week GOLD Classification: unknown CAT score (12/28/20): 23 Most recent Pulmonary Function Testing: N/A 0 exacerbations requiring treatment in the last 6 months  Current oxygen requirements: none Given elevated CAT score >10, would consider adding a LAMA such as Spiriva Respimat. Spiriva is covered under the [place] program so will plan to apply for patient assistance if patient receives Medicare Extra Help denial or can proceed with sending prescription to pharmacy if patient approved for Medicare Extra Help    Atrial Fibrillation: Controlled Current rate control: metoprolol tartrate 100 mg by mouth twice daily Most recent ECG: normal sinus Anticoagulation: rivaroxaban (Xarelto) 20 mg by mouth once daily Denies signs and symptoms of bleeding CHADS2VASc score: 3 (CHF, hypertension, and diabetes) Prior ablation: '[]'  Yes  '[x]'  No Home blood pressure: not checking Home heart rate: not checking Continue metoprolol  Continue rivaroxaban (Xarelto) 20 mg by mouth once daily Encouraged regular physical activity for general health benefits Recommend home blood pressure and heart rate monitoring, to bring results in next visit Discussed need for medication compliance  Anxiety/Depression: Uncontrolled per patient. Talking to his providers helps him.  Current medication: bupropion XL 150 mg by mouth daily (recently  discontinued citalopram and buspirone) Patient reports tolerating bupropion well. Plan to titrate bupropion to 300 mg daily after 4 weeks on lower dose.  Patient recently started on hydroxyzine 25 mg by mouth every 6 hours as needed for anxiety. Patient reports that this is working well for him so far. He has not been tearful like he was before. Since this prescription was from the ED, will need refill by PCP. Will recommend that we continue this along with bupropion.   Patient Goals/Self-Care Activities Over the next 90 days, patient will:  take medications as prescribed check glucose twice daily (fasting and 2 hours after dinner), document, and provide at future appointments check blood pressure at least once daily, document, and provide at future appointments weigh daily, and contact provider if weight gain of 3 lbs in a day or more than 5 lbs in a week  Follow Up Plan: Face to Face appointment with care management team member scheduled for:  02/08/21 Next appointment with PCP 02/04/21      Medication Assistance:  Rybelsus approved through [employer] and Jardiance in process with [employer]  Medicare low income subsidy denied  Patient's preferred pharmacy is:  [facility] [address] [place] [address] Phone: [phone] Fax: [facility] ([facility] - [place], [place] [address] Phone: [phone] Fax: [phone]  Follow Up:  Patient agrees to Care Plan and Follow-up.  Plan: Face to Face appointment with care management team member scheduled for: 02/08/21  Kennon Holter,  PharmD Clinical Pharmacist [facility] [phone]

## 2021-02-03 NOTE — Patient Instructions (Signed)
Visit Information  PATIENT GOALS:  Goals Addressed             This Visit's Progress    Track and Manage Fluids and Swelling-Heart Failure       Timeframe:  Long-Range Goal Priority:  High Start Date:               12/07/2020              Expected End Date:     06/09/2021                  Follow Up Date - 03/03/2021   - Important- call cardiologist office if I gain more than 3 pounds in one day or 5 pounds in one week- follow heart failure action plan, know how you are feeling each day - do ankle pumps when sitting - keep legs up while sitting/ watching TV - continue to weigh daily and write weight in diary - follow low salt diet- avoid salty snacks and fast food - watch for swelling in feet, ankles and legs every day - take all medications as prescribed - keep in touch with your doctor about weight gain, swelling   Why is this important?   It is important to check your weight daily and watch how much salt and liquids you have.  It will help you to manage your heart failure.    Notes:      Track and Manage My Symptoms-COPD       Timeframe:  Long-Range Goal Priority:  High Start Date:      12/07/2020                       Expected End Date:     06/09/2021                  Follow Up Date - 03/03/2021   - Continue to follow COPD rescue plan, be mindful of how you feel each day - eliminate symptom triggers at home - continue follow rescue plan if symptoms flare-up, call doctor early on if not feeling well - keep follow-up appointments - see cardiologist 11/29 primary care doctor 9/16 - continue to use CPAP as prescribed - take all medications and use inhalers as prescribed, talk to pharmacist assigned to your case if any questions or concerns - please continue to work with Education officer, museum for management of depression/ anxiety    Why is this important?   Tracking your symptoms and other information about your health helps your doctor plan your care.  Write down the symptoms,  the time of day, what you were doing and what medicine you are taking.  You will soon learn how to manage your symptoms.     Notes:         Patient verbalizes understanding of instructions provided today and agrees to view in [place].   Telephone follow up appointment with care management team member scheduled for:  03/03/2021  Jacqlyn Larsen [facility], BSN RN Case Manager [facility] [phone]

## 2021-02-03 NOTE — Patient Instructions (Signed)
Reginald [name],  It was great to talk to you today!  Please call me with any questions or concerns.   Visit Information  PATIENT GOALS:  Goals Addressed             This Visit's Progress    Medication Management       Patient Goals/Self-Care Activities Over the next 90 days, patient will:  take medications as prescribed check glucose twice daily (fasting and 2 hours after dinner), document, and provide at future appointments check blood pressure at least once daily, document, and provide at future appointments weigh daily, and contact provider if weight gain of 3 lbs in a day or more than 5 lbs in a week               Patient verbalizes understanding of instructions provided today and agrees to view in [place].   Face to Face appointment with care management team member scheduled for: 02/08/21  Kennon Holter, PharmD Clinical Pharmacist [facility] [phone]

## 2021-02-03 NOTE — Telephone Encounter (Signed)
Pt stated that he is taking Torsemide 40 mg BID and has also restarted Jardiance and Lisinopril. Pt denies taking Metolazone.

## 2021-02-03 NOTE — Telephone Encounter (Signed)
Called left message for Reginald [name] she stated she had outstanding orders for pt since July. Let her know that we have not received orders she stated she had just faxed them again after checking for 2 hrs called and let her know we have not received these orders will await return call

## 2021-02-03 NOTE — Telephone Encounter (Signed)
Mariann Laster with [facility] requesting call back in regards to a outstanding order ph# [phone]

## 2021-02-04 ENCOUNTER — Ambulatory Visit: Payer: Medicare HMO | Admitting: *Deleted

## 2021-02-04 ENCOUNTER — Ambulatory Visit: Payer: Medicare HMO | Admitting: Student

## 2021-02-04 ENCOUNTER — Encounter: Payer: Self-pay | Admitting: Student

## 2021-02-04 ENCOUNTER — Ambulatory Visit (INDEPENDENT_AMBULATORY_CARE_PROVIDER_SITE_OTHER): Payer: Medicare HMO | Admitting: Internal Medicine

## 2021-02-04 ENCOUNTER — Encounter: Payer: Self-pay | Admitting: Internal Medicine

## 2021-02-04 ENCOUNTER — Telehealth: Payer: Self-pay | Admitting: Pulmonary Disease

## 2021-02-04 ENCOUNTER — Other Ambulatory Visit: Payer: Self-pay

## 2021-02-04 VITALS — BP 137/71 | HR 75 | Resp 18 | Ht 71.0 in

## 2021-02-04 VITALS — BP 138/60 | HR 84 | Ht 71.0 in | Wt >= 6400 oz

## 2021-02-04 DIAGNOSIS — E1169 Type 2 diabetes mellitus with other specified complication: Secondary | ICD-10-CM

## 2021-02-04 DIAGNOSIS — I5032 Chronic diastolic (congestive) heart failure: Secondary | ICD-10-CM

## 2021-02-04 DIAGNOSIS — I442 Atrioventricular block, complete: Secondary | ICD-10-CM

## 2021-02-04 DIAGNOSIS — Z79899 Other long term (current) drug therapy: Secondary | ICD-10-CM | POA: Diagnosis not present

## 2021-02-04 DIAGNOSIS — I480 Paroxysmal atrial fibrillation: Secondary | ICD-10-CM

## 2021-02-04 DIAGNOSIS — D649 Anemia, unspecified: Secondary | ICD-10-CM | POA: Diagnosis not present

## 2021-02-04 DIAGNOSIS — Z953 Presence of xenogenic heart valve: Secondary | ICD-10-CM | POA: Diagnosis not present

## 2021-02-04 DIAGNOSIS — I10 Essential (primary) hypertension: Secondary | ICD-10-CM

## 2021-02-04 DIAGNOSIS — J449 Chronic obstructive pulmonary disease, unspecified: Secondary | ICD-10-CM

## 2021-02-04 MED ORDER — TORSEMIDE 20 MG PO TABS: 40.0000 mg | ORAL_TABLET | Freq: Two times a day (BID) | ORAL | 11 refills | Status: DC

## 2021-02-04 MED ORDER — TORSEMIDE 20 MG PO TABS: 40.0000 mg | ORAL_TABLET | Freq: Two times a day (BID) | ORAL | 3 refills | Status: DC

## 2021-02-04 NOTE — Telephone Encounter (Signed)
This is a patient of Dr. Elsworth Soho.  Please route to Dr. Elsworth Soho or provider of the day if Dr. Elsworth Soho is not available.

## 2021-02-04 NOTE — Assessment & Plan Note (Signed)
Dyspnea due to COPD, OHS and CHF On Torsemide and Jardiance Metolazone had been held due to AKI Has an appointment with Cardiology today

## 2021-02-04 NOTE — Telephone Encounter (Signed)
I called and spoke with pt and he stated that he received his new cpap machine ( he has been using cpap for years) on 9/1.  He stated that since he has been using this machine he is extremely tired.  He said that he sits down and falls asleep.  He stated that he does get up a lot during the night to pee.  Pt sounded out of breath to me but stated that it is normal for him.  He stated that his sugars have been all over the place and his medications are not working and he is going crazy.  He stated that he has called the suicide hotline--he said that he is not suicidal but he just needed someone to talk to since he has very bad anxiety.  He stated that the DME company did pull his DL and they stated that it looked ok to them but he needed to follow up with VS.  VS please advise.  Thanks

## 2021-02-04 NOTE — Progress Notes (Signed)
Established Patient Office Visit  Subjective:  Patient ID: Reginald [name], male    DOB: [birth year]  Age: 50 y.o. MRN: [id number]  CC:  Chief Complaint  Patient presents with   Follow-up    4 month follow up pt has not been sleeping having anxiety been weak and feels so tired all the time been to the ER a few times     HPI Reginald [name] is a 50 year old male with past medical history of hypertension, aortic stenosis s/p aortic valve replacement and repair of ascending thoracic aortic aneurysm, paroxysmal atrial fibrillation, chronic CHF, h/o pacemaker (complete heart block), OSA on CPAP, COPD and depression who presents for follow up of his chronic medical conditions.  OSA/OHS: He has CPAP device now, and has started using it for the last 2 weeks. He still has daytime sleepiness, fatigue and headache. His CPAP device pressure and/or mask might need adjustment based on 30-day data. He is followed by Pulmonology.  COPD: He has been using Albuterol inhaler frequently for COPD. He has not been able to afford maintenance inhaler in the past. He is willing to start it now. We will try to have patient assistance if he has trouble getting it.   HTN: BP is well-controlled. Takes medications regularly. Patient denies headache, dizziness, chest pain, dyspnea or palpitations.   DM: His last HbA1C is 5.4. Denies any polyuria or polydipsia. He is on Jardiance currently. He is awaiting Rybelsus currently. Glipizide has been discontinued due to episodes of hypotension.  AKI: He had another ER visit for fatigue and dyspnea, where his kidney function tests were better. He has started Torsemide, Lisinopril and Jardiance now. He has not started Metolazone yet.   Past Medical History:  Diagnosis Date   Anxiety    Aortic stenosis    Arthritis    Asthma    Back pain    Cellulitis of skin with lymphangitis    CHF (congestive heart failure) (HCC)    Chronic diastolic congestive heart failure (HCC)     Chronic venous insufficiency    Constipation    COPD (chronic obstructive pulmonary disease) (HCC)    Depression    Diabetes ([place])    Dyspnea    Essential hypertension    Gout    Heart murmur    Hypertension    Morbid obesity ([place])    Obesity    Pneumonia    walking pneumonia   Prostatitis    Pulmonary embolism ([place])    Pulmonary hypertension (HCC)    Pyelonephritis    S/P aortic valve replacement with bioprosthetic valve 08/23/2017   25 mm Edwards Inspiris Resilia stented bovine pericardial tissue valve   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   24 mm Hemashield supracoronary straight graft    Sleep apnea    cpap    Thoracic ascending aortic aneurysm (HCC)    Thoracic ascending aortic aneurysm (HCC)    Tobacco abuse     Past Surgical History:  Procedure Laterality Date   AORTIC VALVE REPLACEMENT N/A 08/23/2017   Procedure: AORTIC VALVE REPLACEMENT (AVR) USING INSPIRIS RESILIA AORTIC VALVE SIZE 25 MM;  Surgeon: Rexene Alberts, MD;  Location: [place];  Service: Open Heart Surgery;  Laterality: N/A;   CARDIAC VALVE REPLACEMENT N/A    Phreesia 02/07/2020   COLONOSCOPY WITH PROPOFOL N/A 11/14/2020   Procedure: COLONOSCOPY WITH PROPOFOL;  Surgeon: Gatha Mayer, MD;  [facility];  Service: Endoscopy;  Laterality: N/A;   ESOPHAGOGASTRODUODENOSCOPY (EGD) WITH PROPOFOL  N/A 11/14/2020   Procedure: ESOPHAGOGASTRODUODENOSCOPY (EGD) WITH PROPOFOL;  Surgeon: Gatha Mayer, MD;  Location: [facility] ENDOSCOPY;  Service: Endoscopy;  Laterality: N/A;   MULTIPLE EXTRACTIONS WITH ALVEOLOPLASTY N/A 07/23/2017   Procedure: Extraction of tooth #10 with alveoloplasty and gross debridement of remaining teeth;  Surgeon: Lenn Cal, DDS;  Location: [employer];  Service: Oral Surgery;  Laterality: N/A;   PACEMAKER IMPLANT N/A 08/28/2017    St Jude Medical Assurity MRI conditional  dual-chamber pacemaker for symptomatic complete heart blockby Dr Rayann Heman   TEE WITHOUT CARDIOVERSION N/A  07/16/2017   Procedure: TRANSESOPHAGEAL ECHOCARDIOGRAM (TEE);  Surgeon: Lelon Perla, MD;  Location: [facility] ENDOSCOPY;  Service: Cardiovascular;  Laterality: N/A;   TEE WITHOUT CARDIOVERSION N/A 08/23/2017   Procedure: TRANSESOPHAGEAL ECHOCARDIOGRAM (TEE);  Surgeon: Rexene Alberts, MD;  Location: [place];  Service: Open Heart Surgery;  Laterality: N/A;   THORACIC AORTIC ANEURYSM REPAIR N/A 08/23/2017   Procedure: THORACIC ASCENDING ANEURYSM REPAIR (AAA) USING HEMASHIELD GOLD KNITTED MICROVEL DOUBLE VELOUR VASCULAR GRAFT D: 24 MM  L: 30 CM;  Surgeon: Rexene Alberts, MD;  Location: [place];  Service: Open Heart Surgery;  Laterality: N/A;    Family History  Problem Relation Age of Onset   Hypertension Mother    Alzheimer's disease Mother    Heart attack Mother    Heart attack Brother    Diabetes Brother     Social History   Socioeconomic History   Marital status: Divorced    Spouse name: Not on file   Number of children: Not on file   Years of education: Not on file   Highest education level: Not on file  Occupational History   Not on file  Tobacco Use   Smoking status: Former    Packs/day: 2.00    Years: 16.00    Pack years: 32.00    Types: Cigarettes    Quit date: 08/23/2017    Years since quitting: 3.4   Smokeless tobacco: Never  Vaping Use   Vaping Use: Never used  Substance and Sexual Activity   Alcohol use: No    Comment: have had alcohol in the past, not heavy   Drug use: No   Sexual activity: Not Currently    Partners: Female  Other Topics Concern   Not on file  Social History Narrative   Not on file   Social Determinants of Health   Financial Resource Strain: Low Risk    Difficulty of Paying Living Expenses: Not very hard  Food Insecurity: No Food Insecurity   Worried About Charity fundraiser in the Last Year: Never true   [place] in the Last Year: Never true  Transportation Needs: No Transportation Needs   Lack of Transportation (Medical): No    Lack of Transportation (Non-Medical): No  Physical Activity: Inactive   Days of Exercise per Week: 0 days   Minutes of Exercise per Session: 0 min  Stress: Stress Concern Present   Feeling of Stress : Rather much  Social Connections: Socially Isolated   Frequency of Communication with Friends and Family: More than three times a week   Frequency of Social Gatherings with Friends and Family: Once a week   Attends Religious Services: Never   Marine scientist or Organizations: No   Attends Archivist Meetings: Never   Marital Status: Divorced  Human resources officer Violence: Not At Risk   Fear of Current or Ex-Partner: No   Emotionally Abused: No  Physically Abused: No   Sexually Abused: No    Outpatient Medications Prior to Visit  Medication Sig Dispense Refill   acetaminophen (TYLENOL) 325 MG tablet Take 650 mg by mouth every 6 (six) hours as needed for mild pain.     albuterol (PROVENTIL) (2.5 MG/3ML) 0.083% nebulizer solution INHALE 1 VIAL VIA NEBULIZER EVERY 6 HOURS AS NEEDED FOR WHEEZING OR SHORTNESS OF BREATH 270 mL 1   albuterol (VENTOLIN HFA) 108 (90 Base) MCG/ACT inhaler INHALE 2 PUFFS INTO THE LUNGS EVERY 6 (SIX) HOURS AS NEEDED FOR WHEEZING OR SHORTNESS OF BREATH. 1 each 1   allopurinol (ZYLOPRIM) 300 MG tablet TAKE 1 TABLET EVERY DAY 90 tablet 0   ammonium lactate (AMLACTIN) 12 % lotion Apply 1 application topically as needed for dry skin. 400 g 0   atorvastatin (LIPITOR) 40 MG tablet TAKE 1 TABLET EVERY DAY 90 tablet 0   Blood Glucose Monitoring Suppl (TRUE METRIX METER) w/Device KIT USE AS DIRECTED 1 kit 0   buPROPion (WELLBUTRIN XL) 150 MG 24 hr tablet Take 1 tablet (150 mg total) by mouth daily. 30 tablet 0   diclofenac Sodium (VOLTAREN) 1 % GEL Apply 2 g topically 4 (four) times daily. 150 g 1   docusate sodium (COLACE) 100 MG capsule Take 2 capsules (200 mg total) by mouth 2 (two) times daily. 30 capsule 0   empagliflozin (JARDIANCE) 10 MG TABS tablet Take 1  tablet (10 mg total) by mouth daily before breakfast. 30 tablet 11   FEROSUL 325 (65 Fe) MG tablet TAKE 1 TABLET EVERY DAY 90 tablet 1   fexofenadine (ALLEGRA) 180 MG tablet Take 180 mg by mouth daily.     gabapentin (NEURONTIN) 100 MG capsule Take 1 capsule in the morning and 2 capsules in the evening. 90 capsule 0   hydrOXYzine (ATARAX/VISTARIL) 25 MG tablet Take 1 tablet (25 mg total) by mouth every 8 (eight) hours as needed. 90 tablet 2   Krill Oil 350 MG CAPS Take 1 capsule by mouth in the morning and at bedtime.      lisinopril (ZESTRIL) 20 MG tablet Take 1 tablet (20 mg total) by mouth daily. 90 tablet 3   loratadine (CLARITIN) 10 MG tablet Take 10 mg by mouth daily as needed for allergies.     metFORMIN (GLUCOPHAGE) 850 MG tablet Take 1 tablet (850 mg total) by mouth 2 (two) times daily with a meal. 180 tablet 1   metolazone (ZAROXOLYN) 5 MG tablet TAKE 1 TABLET (5 MG TOTAL) BY MOUTH THREE TIMES A WEEK. 36 tablet 3   metoprolol tartrate (LOPRESSOR) 100 MG tablet TAKE 1 TABLET TWICE DAILY 180 tablet 3   pantoprazole (PROTONIX) 40 MG tablet Take 1 tablet (40 mg total) by mouth daily. 90 tablet 3   penicillin v potassium (VEETID) 500 MG tablet Take 1 tablet (500 mg total) by mouth 4 (four) times daily for 7 days. 28 tablet 0   polyethylene glycol (MIRALAX) 17 g packet Take 17 g by mouth daily as needed. 14 each 0   potassium chloride SA (KLOR-CON) 20 MEQ tablet TAKE 3 TABS DAILY AND TAKE AN ADDITIONAL 1 TAB ON THE DAY YOU TAKE YOUR WEEKLY METOLAZONE DOSE 283 tablet 3   rivaroxaban (XARELTO) 20 MG TABS tablet TAKE 1 Tablet BY MOUTH ONCE EVERY DAY WITH SUPPER 90 tablet 3   Semaglutide (RYBELSUS) 14 MG TABS Take 14 mg by mouth in the morning.     terbinafine (LAMISIL) 250 MG tablet Take 1 tablet  by mouth once daily 28 tablet 0   Tiotropium Bromide Monohydrate (SPIRIVA RESPIMAT) 2.5 MCG/ACT AERS Inhale 2 puffs into the lungs daily. 4 g 5   torsemide (DEMADEX) 20 MG tablet Take 40 mg in the AM and  Take 20 mg in the PM 90 tablet 11   TRUE METRIX BLOOD GLUCOSE TEST test strip TEST ONE TIME DAILY FOR DIABETES 100 strip 5   TRUEplus Lancets 33G MISC USE TO TEST ONE TIME DAILY FOR DIABETES 100 each 5   [START ON 02/17/2021] buPROPion (WELLBUTRIN XL) 300 MG 24 hr tablet Take 1 tablet (300 mg total) by mouth daily. (Patient not taking: Reported on 02/04/2021) 90 tablet 3   benzonatate (TESSALON) 100 MG capsule Take 1 capsule (100 mg total) by mouth 3 (three) times daily as needed for cough. (Patient not taking: Reported on 02/04/2021) 20 capsule 0   No facility-administered medications prior to visit.    No Known Allergies  ROS Review of Systems  Constitutional:  Positive for fatigue. Negative for chills and fever.  HENT:  Positive for sore throat. Negative for congestion, sinus pressure and sinus pain.   Eyes:  Negative for pain and redness.  Respiratory:  Positive for cough and shortness of breath.   Cardiovascular:  Positive for leg swelling. Negative for chest pain and palpitations.  Gastrointestinal:  Positive for constipation. Negative for diarrhea and vomiting.  Endocrine: Negative for polydipsia and polyuria.  Genitourinary:  Negative for dysuria and hematuria.  Musculoskeletal:  Positive for arthralgias and back pain. Negative for neck pain and neck stiffness.  Skin:  Positive for rash.  Neurological:  Positive for weakness and headaches. Negative for syncope.  Psychiatric/Behavioral:  Positive for dysphoric mood and sleep disturbance. Negative for agitation and behavioral problems. The patient is nervous/anxious.      Objective:    Physical Exam Vitals reviewed.  Constitutional:      General: He is not in acute distress.    Appearance: He is obese. He is not diaphoretic.     Comments: In wheelchair, uses cane at home  HENT:     Head: Normocephalic and atraumatic.     Nose: Nose normal.     Mouth/Throat:     Mouth: Mucous membranes are moist.  Eyes:     General: No  scleral icterus.    Extraocular Movements: Extraocular movements intact.  Cardiovascular:     Rate and Rhythm: Normal rate and regular rhythm.     Pulses: Normal pulses.     Heart sounds: Murmur (Systolic over b/l upper sternal borders) heard.  Pulmonary:     Breath sounds: Wheezing (Mild over left lung base) present. No rales.  Abdominal:     Palpations: Abdomen is soft.     Tenderness: There is no abdominal tenderness.  Musculoskeletal:     Cervical back: Neck supple. No tenderness.     Right lower leg: Edema (2+) present.     Left lower leg: Edema (2+) present.  Skin:    General: Skin is warm.     Findings: Rash (stasis dermatitis over b/l LE) present.  Neurological:     General: No focal deficit present.     Mental Status: He is alert and oriented to person, place, and time.     Sensory: No sensory deficit.     Motor: Weakness (4/5 in b/l LE) present.  Psychiatric:        Mood and Affect: Mood normal.        Behavior: Behavior normal.  BP 137/71 (BP Location: Left Arm, Patient Position: Sitting, Cuff Size: Normal)   Pulse 75   Resp 18   Ht 5' 11" (1.803 m)   SpO2 97%   BMI 68.03 kg/m  Wt Readings from Last 3 Encounters:  02/02/21 (!) 487 lb 12.8 oz (221.3 kg)  01/31/21 (!) 480 lb (217.7 kg)  01/30/21 (!) 470 lb (213.2 kg)     Health Maintenance Due  Topic Date Due   OPHTHALMOLOGY EXAM  Never done   TETANUS/TDAP  Never done   Pneumococcal Vaccine 25-59 Years old (2 - PCV) 03/16/2020   COVID-19 Vaccine (3 - Booster for Janssen series) 05/10/2020   INFLUENZA VACCINE  12/20/2020    There are no preventive care reminders to display for this patient.  Lab Results  Component Value Date   TSH 4.060 10/04/2020   Lab Results  Component Value Date   WBC 12.6 (H) 02/02/2021   HGB 11.5 (L) 02/02/2021   HCT 37.3 (L) 02/02/2021   MCV 85.9 02/02/2021   PLT 290 02/02/2021   Lab Results  Component Value Date   NA 136 02/02/2021   K 3.5 02/02/2021   CO2 29  02/02/2021   GLUCOSE 152 (H) 02/02/2021   BUN 33 (H) 02/02/2021   CREATININE 1.39 (H) 02/02/2021   BILITOT 0.7 11/24/2020   ALKPHOS 98 11/24/2020   AST 41 (H) 11/24/2020   ALT 27 11/24/2020   PROT 7.9 11/24/2020   ALBUMIN 4.3 11/24/2020   CALCIUM 9.3 02/02/2021   ANIONGAP 11 02/02/2021   EGFR 54 (L) 11/24/2020   Lab Results  Component Value Date   CHOL 123 06/10/2020   Lab Results  Component Value Date   HDL 25 (L) 06/10/2020   Lab Results  Component Value Date   LDLCALC 54 06/10/2020   Lab Results  Component Value Date   TRIG 281 (H) 06/10/2020   Lab Results  Component Value Date   CHOLHDL 4.9 06/10/2020   Lab Results  Component Value Date   HGBA1C 5.4 11/12/2020      Assessment & Plan:   Problem List Items Addressed This Visit       Cardiovascular and Mediastinum   Essential hypertension - Primary (Chronic)    BP Readings from Last 1 Encounters:  02/04/21 137/71  Well-controlled with Lisinopril and Metoprolol Counseled for compliance with the medications Advised DASH diet and moderate exercise/walking as tolerated      Chronic heart failure with preserved ejection fraction (HFpEF) (HCC)    Dyspnea due to COPD, OHS and CHF On Torsemide and Jardiance Metolazone had been held due to AKI Has an appointment with Cardiology today      Paroxysmal atrial fibrillation ([place])    On Metoprolol and Xarelto Followed by Cardiology        Endocrine   DM2 (diabetes mellitus, type 2) ([place])    HbA1C: 5.4 With peripheral neuropathy On Metformin, Rybelsus and Jardiance currently Had episode of hypoglycemia, DC Glipizide to 5 mg XL dose On Atorvastatin 40 mg QD       No orders of the defined types were placed in this encounter.   Follow-up: Return in about 3 months (around 05/06/2021) for Annual physical.    [name], MD

## 2021-02-04 NOTE — Progress Notes (Signed)
Cardiology Office Note    Date:  02/04/2021   ID:  Reginald [name], DOB 1970-06-01, MRN [id number]  PCP:  [name], MD  Cardiologist: Jenkins Rouge, MD   EP: Dr. Rayann Heman  Chief Complaint  Patient presents with   Follow-up    Recent Emergency Dept visit    History of Present Illness:    Reginald [name] is a 50 y.o. male with past medical history of severe AS (s/p bovine tissue AVR in 08/2017 with repair of ascending thoracic aortic aneurysm), normal coronary arteries by cardiac catheterization in 2019, HFpEF, paroxysmal atrial fibrillation, CHB (s/p St. Jude PPM placement in 08/2017), history of bilateral PE, HTN, morbid obesity and OSA (on CPAP) who presents to the office today for follow-up from a recent Emergency Department visit.   He was examined by myself in 12/2020 and his weight had been stable in the 450's to 460's at home (at 471 lbs on the office scales) and he reported having chronic dyspnea on exertion. He was continued on Torsemide 32m BID along with PRN Metolazone and Jardiance 152mdaily was added to his medication regimen to assist with his fluid overload. He did see Dr. AlRayann Hemann 01/21/2021 and his device was functioning normally with an AT/AF burden < 1%.   [facility] for evaluation of hypotension as his SBP was in the 70's on home check. Also reported worsening orthopnea and pitting edema. BNP was normal at 67 and CXR showed mild cardiomegaly and pulmonary vascular congestion. He was found to have an AKI with creatinine at 2.13 and he received IVF and was encouraged to hold Lisinopril, Jardiance and Torsemide at discharge. Presented back to the ED on 02/02/2021 due to worsening symptoms and was also experiencing a sore throat and progressive anxiety. Was given an Rx for Penicillin if culture results are positive. Repeat labs showed his Hgb was stable at 11.5 and creatinine had improved to 1.39.  In talking the patient today, he reports  worsening dyspnea on exertion and lower extremity edema since earlier this week with at least a 10+ pound weight gain. He has resumed Torsemide along with taking Lisinopril and Jardiance but has not yet utilized Metolazone given his variable renal function. He continues to use a CPAP at nighttime or whenever he takes a nap. Says he has been significantly more anxious over the past week and his PCP has been adjusting his antianxiety medications but he has not yet noticed improvement.   Past Medical History:  Diagnosis Date   Anxiety    Aortic stenosis    Arthritis    Asthma    Back pain    Cellulitis of skin with lymphangitis    CHF (congestive heart failure) (HCC)    Chronic diastolic congestive heart failure (HCC)    Chronic venous insufficiency    Constipation    COPD (chronic obstructive pulmonary disease) (HCC)    Depression    Diabetes ([place]   Dyspnea    Essential hypertension    Gout    Heart murmur    Hypertension    Morbid obesity ([place]   Obesity    Pneumonia    walking pneumonia   Prostatitis    Pulmonary embolism ([place]   Pulmonary hypertension (HCC)    Pyelonephritis    S/P aortic valve replacement with bioprosthetic valve 08/23/2017   25 mm Edwards Inspiris Resilia stented bovine pericardial tissue valve   S/P ascending  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/2017   24 mm Hemashield supracoronary straight graft    Sleep apnea    cpap    Thoracic ascending aortic aneurysm [facility])    Thoracic ascending aortic aneurysm ([place])    Tobacco abuse     Past Surgical History:  Procedure Laterality Date   AORTIC VALVE REPLACEMENT N/A 08/23/2017   Procedure: AORTIC VALVE REPLACEMENT (AVR) USING INSPIRIS RESILIA AORTIC VALVE SIZE 25 MM;  Surgeon: Rexene Alberts, MD;  Location: [place];  Service: Open Heart Surgery;  Laterality: N/A;   CARDIAC VALVE REPLACEMENT N/A    Phreesia 02/07/2020   COLONOSCOPY WITH PROPOFOL N/A 11/14/2020   Procedure: COLONOSCOPY WITH PROPOFOL;  Surgeon:  Gatha Mayer, MD;  Location: [facility] ENDOSCOPY;  Service: Endoscopy;  Laterality: N/A;   ESOPHAGOGASTRODUODENOSCOPY (EGD) WITH PROPOFOL N/A 11/14/2020   Procedure: ESOPHAGOGASTRODUODENOSCOPY (EGD) WITH PROPOFOL;  Surgeon: Gatha Mayer, MD;  Location: [place];  Service: Endoscopy;  Laterality: N/A;   MULTIPLE EXTRACTIONS WITH ALVEOLOPLASTY N/A 07/23/2017   Procedure: Extraction of tooth #10 with alveoloplasty and gross debridement of remaining teeth;  Surgeon: Lenn Cal, DDS;  Location: [place];  Service: Oral Surgery;  Laterality: N/A;   PACEMAKER IMPLANT N/A 08/28/2017    St Jude Medical Assurity MRI conditional  dual-chamber pacemaker for symptomatic complete heart blockby Dr Rayann Heman   TEE WITHOUT CARDIOVERSION N/A 07/16/2017   Procedure: TRANSESOPHAGEAL ECHOCARDIOGRAM (TEE);  Surgeon: Lelon Perla, MD;  Location: [facility] ENDOSCOPY;  Service: Cardiovascular;  Laterality: N/A;   TEE WITHOUT CARDIOVERSION N/A 08/23/2017   Procedure: TRANSESOPHAGEAL ECHOCARDIOGRAM (TEE);  Surgeon: Rexene Alberts, MD;  Location: [place];  Service: Open Heart Surgery;  Laterality: N/A;   THORACIC AORTIC ANEURYSM REPAIR N/A 08/23/2017   Procedure: THORACIC ASCENDING ANEURYSM REPAIR (AAA) USING HEMASHIELD GOLD KNITTED MICROVEL DOUBLE VELOUR VASCULAR GRAFT D: 24 MM  L: 30 CM;  Surgeon: Rexene Alberts, MD;  Location: [place];  Service: Open Heart Surgery;  Laterality: N/A;    Current Medications: Outpatient Medications Prior to Visit  Medication Sig Dispense Refill   acetaminophen (TYLENOL) 325 MG tablet Take 650 mg by mouth every 6 (six) hours as needed for mild pain.     albuterol (PROVENTIL) (2.5 MG/3ML) 0.083% nebulizer solution INHALE 1 VIAL VIA NEBULIZER EVERY 6 HOURS AS NEEDED FOR WHEEZING OR SHORTNESS OF BREATH 270 mL 1   albuterol (VENTOLIN HFA) 108 (90 Base) MCG/ACT inhaler INHALE 2 PUFFS INTO THE LUNGS EVERY 6 (SIX) HOURS AS NEEDED FOR WHEEZING OR SHORTNESS OF BREATH. 1 each 1   allopurinol  (ZYLOPRIM) 300 MG tablet TAKE 1 TABLET EVERY DAY 90 tablet 0   ammonium lactate (AMLACTIN) 12 % lotion Apply 1 application topically as needed for dry skin. 400 g 0   atorvastatin (LIPITOR) 40 MG tablet TAKE 1 TABLET EVERY DAY 90 tablet 0   Blood Glucose Monitoring Suppl (TRUE METRIX METER) w/Device KIT USE AS DIRECTED 1 kit 0   buPROPion (WELLBUTRIN XL) 150 MG 24 hr tablet Take 1 tablet (150 mg total) by mouth daily. 30 tablet 0   [START ON 02/17/2021] buPROPion (WELLBUTRIN XL) 300 MG 24 hr tablet Take 1 tablet (300 mg total) by mouth daily. 90 tablet 3   diclofenac Sodium (VOLTAREN) 1 % GEL Apply 2 g topically 4 (four) times daily. 150 g 1   docusate sodium (COLACE) 100 MG capsule Take 2 capsules (200 mg total) by mouth 2 (two) times daily. 30 capsule 0   FEROSUL 325 (65 Fe) MG  tablet TAKE 1 TABLET EVERY DAY 90 tablet 1   fexofenadine (ALLEGRA) 180 MG tablet Take 180 mg by mouth daily.     gabapentin (NEURONTIN) 100 MG capsule Take 1 capsule in the morning and 2 capsules in the evening. 90 capsule 0   hydrOXYzine (ATARAX/VISTARIL) 25 MG tablet Take 1 tablet (25 mg total) by mouth every 8 (eight) hours as needed. 90 tablet 2   Krill Oil 350 MG CAPS Take 1 capsule by mouth in the morning and at bedtime.      lisinopril (ZESTRIL) 20 MG tablet Take 1 tablet (20 mg total) by mouth daily. 90 tablet 3   loratadine (CLARITIN) 10 MG tablet Take 10 mg by mouth daily as needed for allergies.     metFORMIN (GLUCOPHAGE) 850 MG tablet Take 1 tablet (850 mg total) by mouth 2 (two) times daily with a meal. 180 tablet 1   metolazone (ZAROXOLYN) 5 MG tablet TAKE 1 TABLET (5 MG TOTAL) BY MOUTH THREE TIMES A WEEK. 36 tablet 3   metoprolol tartrate (LOPRESSOR) 100 MG tablet TAKE 1 TABLET TWICE DAILY 180 tablet 3   pantoprazole (PROTONIX) 40 MG tablet Take 1 tablet (40 mg total) by mouth daily. 90 tablet 3   penicillin v potassium (VEETID) 500 MG tablet Take 1 tablet (500 mg total) by mouth 4 (four) times daily for 7  days. 28 tablet 0   polyethylene glycol (MIRALAX) 17 g packet Take 17 g by mouth daily as needed. 14 each 0   potassium chloride SA (KLOR-CON) 20 MEQ tablet TAKE 3 TABS DAILY AND TAKE AN ADDITIONAL 1 TAB ON THE DAY YOU TAKE YOUR WEEKLY METOLAZONE DOSE 283 tablet 3   rivaroxaban (XARELTO) 20 MG TABS tablet TAKE 1 Tablet BY MOUTH ONCE EVERY DAY WITH SUPPER 90 tablet 3   Semaglutide (RYBELSUS) 14 MG TABS Take 14 mg by mouth in the morning.     terbinafine (LAMISIL) 250 MG tablet Take 1 tablet by mouth once daily 28 tablet 0   Tiotropium Bromide Monohydrate (SPIRIVA RESPIMAT) 2.5 MCG/ACT AERS Inhale 2 puffs into the lungs daily. 4 g 5   TRUE METRIX BLOOD GLUCOSE TEST test strip TEST ONE TIME DAILY FOR DIABETES 100 strip 5   TRUEplus Lancets 33G MISC USE TO TEST ONE TIME DAILY FOR DIABETES 100 each 5   torsemide (DEMADEX) 20 MG tablet Take 40 mg in the AM and Take 20 mg in the PM 90 tablet 11   empagliflozin (JARDIANCE) 10 MG TABS tablet Take 1 tablet (10 mg total) by mouth daily before breakfast. 30 tablet 11   No facility-administered medications prior to visit.     Allergies:   Patient has no known allergies.   Social History   Socioeconomic History   Marital status: Divorced    Spouse name: Not on file   Number of children: Not on file   Years of education: Not on file   Highest education level: Not on file  Occupational History   Not on file  Tobacco Use   Smoking status: Former    Packs/day: 2.00    Years: 16.00    Pack years: 32.00    Types: Cigarettes    Quit date: 08/23/2017    Years since quitting: 3.4   Smokeless tobacco: Never  Vaping Use   Vaping Use: Never used  Substance and Sexual Activity   Alcohol use: No    Comment: have had alcohol in the past, not heavy   Drug use: No  Sexual activity: Not Currently    Partners: Female  Other Topics Concern   Not on file  Social History Narrative   Not on file   Social Determinants of Health   Financial Resource  Strain: Low Risk    Difficulty of Paying Living Expenses: Not very hard  Food Insecurity: No Food Insecurity   Worried About Running Out of Food in the Last Year: Never true   Ran Out of Food in the Last Year: Never true  Transportation Needs: No Transportation Needs   Lack of Transportation (Medical): No   Lack of Transportation (Non-Medical): No  Physical Activity: Inactive   Days of Exercise per Week: 0 days   Minutes of Exercise per Session: 0 min  Stress: Stress Concern Present   Feeling of Stress : Rather much  Social Connections: Socially Isolated   Frequency of Communication with Friends and Family: More than three times a week   Frequency of Social Gatherings with Friends and Family: Once a week   Attends Religious Services: Never   Marine scientist or Organizations: No   Attends Music therapist: Never   Marital Status: Divorced     Family History:  [name] family history includes Alzheimer's disease in his mother; Diabetes in his brother; Heart attack in his brother and mother; Hypertension in his mother.   Review of Systems:    Please see the history of present illness.     All other systems reviewed and are otherwise negative except as noted above.   Physical Exam:    VS:  BP 138/60   Pulse 84   Ht 5' 11" (1.803 m)   Wt (!) 493 lb 4.8 oz (223.8 kg)   SpO2 96%   BMI 68.80 kg/m    General: Pleasant obese male appearing in no acute distress. Head: Normocephalic, atraumatic. Neck: No carotid bruits. JVD difficult to assess secondary to body habitus. Lungs: Respirations regular and unlabored, mild rales along right base.  Heart: Regular rate and rhythm. No S3 or S4.  2/6 SEM along RUSB.  Abdomen: Appears non-distended. No obvious abdominal masses. Msk:  Strength and tone appear normal for age. No obvious joint deformities or effusions. Extremities: No clubbing or cyanosis. 1+ pitting edema bilaterally.  Distal pedal pulses are 2+  bilaterally. Neuro: Alert and oriented X 3. Moves all extremities spontaneously. No focal deficits noted. Psych:  Responds to questions appropriately with a normal affect. Skin: No rashes or lesions noted  Wt Readings from Last 3 Encounters:  02/04/21 (!) 493 lb 4.8 oz (223.8 kg)  02/02/21 (!) 487 lb 12.8 oz (221.3 kg)  01/31/21 (!) 480 lb (217.7 kg)     Studies/Labs Reviewed:   EKG:  EKG is not ordered today. EKG from 01/30/2021 is reviewed today and demonstrates NSR, HR 79 with RBBB.   Recent Labs: 10/04/2020: TSH 4.060 11/15/2020: Magnesium 2.2 11/24/2020: ALT 27 01/30/2021: B Natriuretic Peptide 67.0 02/02/2021: BUN 33; Creatinine, Ser 1.39; Hemoglobin 11.5; Platelets 290; Potassium 3.5; Sodium 136   Lipid Panel    Component Value Date/Time   CHOL 123 06/10/2020 0837   TRIG 281 (H) 06/10/2020 0837   HDL 25 (L) 06/10/2020 0837   CHOLHDL 4.9 06/10/2020 0837   CHOLHDL 6.6 01/01/2020 1014   VLDL 73 (H) 01/01/2020 1014   LDLCALC 54 06/10/2020 0837    Additional studies/ records that were reviewed today include:   Echocardiogram: 07/28/2020 IMPRESSIONS     1. Images are limited despite use of  Definity.   2. Left ventricular ejection fraction, by estimation, is 60 to 65%. The  left ventricle has normal function. Left ventricular endocardial border  not optimally defined to evaluate regional wall motion. There is mild left  ventricular hypertrophy. Left  ventricular diastolic parameters are indeterminate. Elevated left atrial  pressure.   3. Right ventricular systolic function is mildly reduced. The right  ventricular size is normal. There is normal pulmonary artery systolic  pressure. The estimated right ventricular systolic pressure is 00.9 mmHg.  Device wire present.   4. The mitral valve is grossly normal. Trivial mitral valve  regurgitation.   5. The aortic valve has been repaired/replaced. Aortic valve  regurgitation is not visualized. There is a 25 mm Edwards valve  present in  the aortic position. Aortic valve mean gradient measures 26.0 mmHg. Mean  gradient was 23 mmHg in 2019, relatively  stable - could be suggestive of patient/prosthesis mismatch. Poorly  visualized.   6. The inferior vena cava is dilated in size with >50% respiratory  variability, suggesting right atrial pressure of 8 mmHg.   Assessment:    1. Chronic diastolic (congestive) heart failure ([place])   2. 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   3. Paroxysmal atrial fibrillation (HCC)   4. Medication management   5. CHB (complete heart block) (HCC)   6. Anemia, unspecified type      Plan:   In order of problems listed above:  1. HFpEF - His weight has increased by almost 30 pounds over the past month and at least 10 pounds this week but diuresis has been challenging in the setting of his variable renal function and he is volume overloaded on examination today. He is currently taking Torsemide 40 mg twice daily and I encouraged him to take Metolazone later today or if not able to take today, take over the weekend for one dose to assist with diuresis. Will recheck a BMET on Tuesday. He has already resumed Lisinopril and Jardiance. I encouraged him to check BP at home and report back if he has episodes of hypotension as his dose of Lisinopril may need to be reduced.  2. Severe Aortic Stenosis - He is s/p bovine tissue AVR in 08/2017 with repair of ascending thoracic aortic aneurysm and most recent echo in 07/2020 showed a mean gradient of 26 mmHg which is similar to prior imaging. Would anticipate repeat imaging in 07/2021.  3. Paroxysmal Atrial Fibrillation - His recent device interrogation showed an AT/AF burden less than 1%. He remains on Lopressor 100 mg twice daily for rate control and Xarelto 20 mg daily for anticoagulation.  4. CHB - He is s/p St. Jude PPM placement in 08/2017 and recent device interrogation earlier  this month showed his pacemaker was functioning normally.  5. Anemia - His hemoglobin was stable at 11.5 on most recent check. He has scheduled follow-up with GI in 02/2021.   Medication Adjustments/Labs and Tests Ordered: Current medicines are reviewed at length with the patient today.  Concerns regarding medicines are outlined above.  Medication changes, Labs and Tests ordered today are listed in the Patient Instructions below. Patient Instructions  Medication Instructions: Your physician recommends that you continue on your current medications as directed. Please refer to the Current Medication list given to you today. TAKE METOLAZONE ONCE THIS WEEK  *If you need a refill on your cardiac medications before your next appointment, please call your pharmacy*   Lab Work: BMET-  TUESDAY If you have labs (blood work) drawn today and your tests are completely normal, you will receive your results only by: [place] (if you have MyChart) OR A paper copy in the mail If you have any lab test that is abnormal or we need to change your treatment, we will call you to review the results.   Testing/Procedures: None   Follow-Up: At [facility], you and your health needs are our priority.  As part of our continuing mission to provide you with exceptional heart care, we have created designated Provider Care Teams.  These Care Teams include your primary Cardiologist (physician) and Advanced Practice Providers (APPs -  Physician Assistants and Nurse Practitioners) who all work together to provide you with the care you need, when you need it.  We recommend signing up for the patient portal called "MyChart".  Sign up information is provided on this After Visit Summary.  MyChart is used to connect with patients for Virtual Visits (Telemedicine).  Patients are able to view lab/test results, encounter notes, upcoming appointments, etc.  Non-urgent messages can be sent to your provider as well.   To  learn more about what you can do with MyChart, go to [email or web address].    Your next appointment:   Keep scheduled follow up appointment   Other Instructions     Signed, Erma Heritage, PA-C  02/04/2021 5:43 PM    [place]. [address] [place], [place] [address] Phone: [phone] Fax: [phone]

## 2021-02-04 NOTE — Assessment & Plan Note (Signed)
On Metoprolol and Xarelto Followed by Cardiology 

## 2021-02-04 NOTE — Assessment & Plan Note (Signed)
HbA1C: 5.4 With peripheral neuropathy On Metformin, Rybelsus and Jardiance currently Had episode of hypoglycemia, DC Glipizide to 5 mg XL dose On Atorvastatin 40 mg QD

## 2021-02-04 NOTE — Patient Instructions (Signed)
Medication Instructions: Your physician recommends that you continue on your current medications as directed. Please refer to the Current Medication list given to you today. TAKE METOLAZONE ONCE THIS WEEK  *If you need a refill on your cardiac medications before your next appointment, please call your pharmacy*   Lab Work: BMET- TUESDAY If you have labs (blood work) drawn today and your tests are completely normal, you will receive your results only by: [place] (if you have MyChart) OR A paper copy in the mail If you have any lab test that is abnormal or we need to change your treatment, we will call you to review the results.   Testing/Procedures: None   Follow-Up: At [facility], you and your health needs are our priority.  As part of our continuing mission to provide you with exceptional heart care, we have created designated Provider Care Teams.  These Care Teams include your primary Cardiologist (physician) and Advanced Practice Providers (APPs -  Physician Assistants and Nurse Practitioners) who all work together to provide you with the care you need, when you need it.  We recommend signing up for the patient portal called "MyChart".  Sign up information is provided on this After Visit Summary.  MyChart is used to connect with patients for Virtual Visits (Telemedicine).  Patients are able to view lab/test results, encounter notes, upcoming appointments, etc.  Non-urgent messages can be sent to your provider as well.   To learn more about what you can do with MyChart, go to [email or web address].    Your next appointment:   Keep scheduled follow up appointment   Other Instructions

## 2021-02-04 NOTE — Patient Instructions (Signed)
Please start taking Metolazone as directed by your Cardiologist.  Please start using Spiriva as prescribed for COPD.  Continue to use CPAP device at bedtime.  Please follow low salt diet and ambulate as tolerated.

## 2021-02-04 NOTE — Assessment & Plan Note (Addendum)
BP Readings from Last 1 Encounters:  02/04/21 137/71   Well-controlled with Lisinopril and Metoprolol Counseled for compliance with the medications Advised DASH diet and moderate exercise/walking as tolerated

## 2021-02-05 LAB — CULTURE, GROUP A STREP (THRC)

## 2021-02-06 NOTE — Patient Instructions (Signed)
Visit Information  PATIENT GOALS:  Goals Addressed   None     The patient verbalized understanding of instructions, educational materials, and care plan provided today and declined offer to receive copy of patient instructions, educational materials, and care plan.   Telephone follow up appointment with care management team member scheduled for:03/07/21  Eduard Clos MSW, LCSW Licensed Clinical Social Education officer, environmental Primary Care [phone]

## 2021-02-06 NOTE — Chronic Care Management (AMB) (Signed)
Chronic Care Management    Clinical Social Work Note  02/06/2021 Name: Reginald [name] MRN: [id number] DOB: 1971-02-11  Reginald [name] is a 50 y.o. year old male who is a primary care patient of [name], MD. The CCM team was consulted to assist the patient with chronic disease management and/or care coordination needs related to: Mental Health Counseling and Resources.   Engaged with patient by telephone for follow up visit in response to provider referral for social work chronic care management and care coordination services.   Consent to Services:  The patient was given information about Chronic Care Management services, agreed to services, and gave verbal consent prior to initiation of services.  Please see initial visit note for detailed documentation.   Patient agreed to services and consent obtained.   Assessment: Review of patient past medical history, allergies, medications, and health status, including review of relevant consultants reports was performed today as part of a comprehensive evaluation and provision of chronic care management and care coordination services.     SDOH (Social Determinants of Health) assessments and interventions performed:    Advanced Directives Status: Not addressed in this encounter.  CCM Care Plan  No Known Allergies  Outpatient Encounter Medications as of 02/04/2021  Medication Sig Note   acetaminophen (TYLENOL) 325 MG tablet Take 650 mg by mouth every 6 (six) hours as needed for mild pain.    albuterol (PROVENTIL) (2.5 MG/3ML) 0.083% nebulizer solution INHALE 1 VIAL VIA NEBULIZER EVERY 6 HOURS AS NEEDED FOR WHEEZING OR SHORTNESS OF BREATH    albuterol (VENTOLIN HFA) 108 (90 Base) MCG/ACT inhaler INHALE 2 PUFFS INTO THE LUNGS EVERY 6 (SIX) HOURS AS NEEDED FOR WHEEZING OR SHORTNESS OF BREATH.    allopurinol (ZYLOPRIM) 300 MG tablet TAKE 1 TABLET EVERY DAY    ammonium lactate (AMLACTIN) 12 % lotion Apply 1 application topically as needed for dry  skin.    atorvastatin (LIPITOR) 40 MG tablet TAKE 1 TABLET EVERY DAY    Blood Glucose Monitoring Suppl (TRUE METRIX METER) w/Device KIT USE AS DIRECTED    buPROPion (WELLBUTRIN XL) 150 MG 24 hr tablet Take 1 tablet (150 mg total) by mouth daily.    [START ON 02/17/2021] buPROPion (WELLBUTRIN XL) 300 MG 24 hr tablet Take 1 tablet (300 mg total) by mouth daily.    diclofenac Sodium (VOLTAREN) 1 % GEL Apply 2 g topically 4 (four) times daily.    docusate sodium (COLACE) 100 MG capsule Take 2 capsules (200 mg total) by mouth 2 (two) times daily.    empagliflozin (JARDIANCE) 10 MG TABS tablet Take 1 tablet (10 mg total) by mouth daily before breakfast. 02/03/2021: Currently has samples from his cardiology provider   FEROSUL 325 (65 Fe) MG tablet TAKE 1 TABLET EVERY DAY    fexofenadine (ALLEGRA) 180 MG tablet Take 180 mg by mouth daily.    gabapentin (NEURONTIN) 100 MG capsule Take 1 capsule in the morning and 2 capsules in the evening.    hydrOXYzine (ATARAX/VISTARIL) 25 MG tablet Take 1 tablet (25 mg total) by mouth every 8 (eight) hours as needed.    Krill Oil 350 MG CAPS Take 1 capsule by mouth in the morning and at bedtime.     lisinopril (ZESTRIL) 20 MG tablet Take 1 tablet (20 mg total) by mouth daily.    loratadine (CLARITIN) 10 MG tablet Take 10 mg by mouth daily as needed for allergies.    metFORMIN (GLUCOPHAGE) 850 MG tablet Take 1 tablet (  850 mg total) by mouth 2 (two) times daily with a meal.    metolazone (ZAROXOLYN) 5 MG tablet TAKE 1 TABLET (5 MG TOTAL) BY MOUTH THREE TIMES A WEEK.    metoprolol tartrate (LOPRESSOR) 100 MG tablet TAKE 1 TABLET TWICE DAILY    pantoprazole (PROTONIX) 40 MG tablet Take 1 tablet (40 mg total) by mouth daily.    penicillin v potassium (VEETID) 500 MG tablet Take 1 tablet (500 mg total) by mouth 4 (four) times daily for 7 days.    polyethylene glycol (MIRALAX) 17 g packet Take 17 g by mouth daily as needed.    potassium chloride SA (KLOR-CON) 20 MEQ tablet  TAKE 3 TABS DAILY AND TAKE AN ADDITIONAL 1 TAB ON THE DAY YOU TAKE YOUR WEEKLY METOLAZONE DOSE    rivaroxaban (XARELTO) 20 MG TABS tablet TAKE 1 Tablet BY MOUTH ONCE EVERY DAY WITH SUPPER    Semaglutide (RYBELSUS) 14 MG TABS Take 14 mg by mouth in the morning.    terbinafine (LAMISIL) 250 MG tablet Take 1 tablet by mouth once daily    Tiotropium Bromide Monohydrate (SPIRIVA RESPIMAT) 2.5 MCG/ACT AERS Inhale 2 puffs into the lungs daily.    TRUE METRIX BLOOD GLUCOSE TEST test strip TEST ONE TIME DAILY FOR DIABETES    TRUEplus Lancets 33G MISC USE TO TEST ONE TIME DAILY FOR DIABETES    [DISCONTINUED] torsemide (DEMADEX) 20 MG tablet Take 40 mg in the AM and Take 20 mg in the PM    No facility-administered encounter medications on file as of 02/04/2021.    Patient Active Problem List   Diagnosis Date Noted   Rectal bleeding    Chronic blood loss anemia 11/12/2020   Gastrointestinal hemorrhage 10/08/2020   S/P placement of cardiac pacemaker 02/10/2020   Encounter for examination following treatment at hospital 02/10/2020   Anxiety 02/10/2020   DM2 (diabetes mellitus, type 2) ([place]) 02/10/2020   OSA on CPAP    Abnormal liver function 03/16/2019   History of pulmonary embolism 03/16/2019   Paroxysmal atrial fibrillation ([place]) 09/05/2018   COPD (chronic obstructive pulmonary disease) ([place]) 05/07/2018   Gastroesophageal reflux disease    Pulmonary embolism ([place]) 09/18/2017   Encounter for therapeutic drug monitoring 09/04/2017   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 08/23/2017   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   Pulmonary hypertension ([place])    Chronic periodontitis 07/18/2017   Morbid obesity ([place])    Essential hypertension    Tobacco abuse    Chronic heart failure with preserved ejection fraction (HFpEF) ([place])    Chronic venous insufficiency     Conditions to be addressed/monitored: Depression; Mental Health  Concerns   Care Plan : LCSW PLAN OF CARE  Updates made by Deirdre Peer, LCSW since 02/06/2021 12:00 AM     Problem: Unmanaged depression   Priority: High     Long-Range Goal: Reduce symptoms/feelings related to depression   Start Date: 12/21/2020  Expected End Date: 03/21/2021  This Visit's Progress: On track  Recent Progress: On track  Priority: High  Note:   Current Barriers:  Chronic Mental Health needs related to depression Mental Health Concerns  Suicidal Ideation/Homicidal Ideation: No  Clinical Social Work Goal(s):  patient will work with SW  PRN  by telephone or in person to reduce or manage symptoms related to depression  patient will work with SW to address concerns related to depression and treatment options patient will work with PCP and  Pharmacist to address needs related to medication management/adjustment of current RX depression regimen to better treat symptoms patient will demonstrate improved health management independence as evidenced by reduction  of PHQ9 score  Interventions: 02/04/21: Pt reports feeling better today. He is tired and feels his CPAP may be causing him to not sleep well. He plans to discuss further with provider.  CSW discussed his mental health with him- he is now willing to consider counseling and agrees to referral being made to [facility]. CSW advised pt to expect a call from [place] to schedule him.   02/01/21: CSW received call from pt today who reports having a lot of side effects from medications (recently changed/adjusted). Per pt, "I am crying more and over nothing". He also shared that he called the 5 hotline for mental health crisis and was told the line was for people suicidal. Pt states, "I'm not suicidal and would not do that because I love myself too much... and my pets...." he also states that his "faith prevents him from doing that".  Pt has a lot of anxiety today around his medications and reports being told to do different things  by the different providers. CSW advised pt he would need to get guidance from clinical staff (PCP, [place], RN) and CSW will alert the team of above.  CSW did remind pt to call CSW anytime and to alwaysnot hesitate to call 911 if emergency arises.    01/14/21: CSW spoke with pt who admits to history of depression. Pt denies any current or past SI/HI, hospitalizations. Pt is currently on Buspar and Celexa for his depression; his PHQ9 score today was 31- CSW discussed with pt the significance/severity of his score and discussed options. Pt declines counseling support. He also declines referral to Psychiatry indicating he use to see "Dr Truman Hayward" who prescribed his meds but now his PCP does. Pt does not wish to be referred back to "Dr Truman Hayward" nor any other Psychiatrist. Pt also shares that the Buspar is 3 x daily- would like to consider other options that may be better coverage of his symptoms and needs; and less pills- "have to remember to take it".  CSW discussed HCPOA with pt; he does not have anyone appointed and states he does not have a relationship with his family. Encouraged pt to consider completion of Advance Directives- will mail pt packet to review.  Patient interviewed and appropriate assessments performed: PHQ 9 1:1 collaboration with [name], MD regarding development and update of comprehensive plan of care as evidenced by provider attestation and co-signature Patient interviewed and appropriate assessments performed Provided mental health counseling with regard to depression; medications, counseling options  Discussed plans with patient for ongoing care management follow up and provided patient with direct contact information for care management team Provided education and assistance to client regarding Advanced Directives. Depression screen reviewed , PHQ2/ PHQ9 completed, Solution-Focused Strategies, Active listening / Reflection utilized , Psychoeducation for mental health needs , Reviewed  mental health medications with patient and discussed compliance: discussed options for counseling and/or RX adjustments being made, Participation in counseling encouraged , Verbalization of feelings encouraged , Suicidal Ideation/Homicidal Ideation assessed:, Discussed [place] , Discussed referral to [place] to assist with connecting to mental health provider, and pt declines counseling at this time  Patient Self Care Activities:  Self administers medications as prescribed Attends all scheduled provider appointments Performs ADL's independently Performs IADL's independently Independent living  Patient Coping Strengths:  [place]  Able to Communicate Effectively  Patient Self Care Deficits:  Lacks social connections Family not involved/connected  Patient Goals:  -expect phone call from [place] and schedule counseling visit with them - avoid negative self-talk - develop a personal safety plan - develop a plan to deal with triggers like holidays, anniversaries - have a plan for how to handle bad days - spend time or talk with others at least 2 to 3 times per week - spend time or talk with others every day - watch for early signs of feeling worse - call and visit an old friend - check out volunteer opportunities - laugh; watch a funny movie or comedian - perform a random act of kindness - practice relaxation or meditation daily - talk about feelings with a friend, family or spiritual advisor - practice positive thinking and self-talk - call to cancel if needed - keep a calendar with prescription refill dates - keep a calendar with appointment dates  Follow Up Plan: Appointment scheduled for SW follow up with client by phone on: 03/07/21       Follow Up Plan: Appointment scheduled for SW follow up with client by phone on: 03/07/21 Eduard Clos MSW, LCSW Licensed Clinical Social Education officer, environmental Primary Care [phone]

## 2021-02-07 ENCOUNTER — Telehealth: Payer: Self-pay | Admitting: Internal Medicine

## 2021-02-07 ENCOUNTER — Telehealth: Payer: Self-pay | Admitting: Student

## 2021-02-07 NOTE — Telephone Encounter (Signed)
Will check [facility] again on 9/20 since today their website is down

## 2021-02-07 NOTE — Telephone Encounter (Signed)
Pt needs 90 days of Rebelsus 14 mg  called in to [facility],  The pt has the information for 90 free

## 2021-02-07 NOTE — Telephone Encounter (Signed)
New message    Patient was to call and inform [place] of his weight , he has dropped 3lbs since Friday but he is really fatigued .  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

## 2021-02-07 NOTE — Telephone Encounter (Signed)
I need patients weights, left message to return call.

## 2021-02-07 NOTE — Telephone Encounter (Signed)
Reginald Noel, MD  Rosana Berger, CMA; [name], MD; P Lbpu Triage Pool Caller: Unspecified (3 days ago, 11:10 AM) Please obtain CPAP download & arrange OV next available at [facility] office  Please have him contact PCP/ mental health for his depression ASAP  I have cc'd message to PCP        Spoke with the pt  He is aware of response per Dr Elsworth Soho  He will f/u with PCP and has f/u with Dr Elsworth Soho in [place] 04/01/21 [facility] currently down, so will have to check [facility] tomorrow 9/20  He is aware that we will call him once DL reviewed

## 2021-02-07 NOTE — Telephone Encounter (Signed)
Patients weight on 9/17 was 491 lbs, today 9/19 is 485 lbs   Has bmet tomorrow.   Says Zaroxolyn worked very well but he feels like he has so much more fluid to lose.

## 2021-02-08 ENCOUNTER — Other Ambulatory Visit: Payer: Self-pay | Admitting: *Deleted

## 2021-02-08 ENCOUNTER — Other Ambulatory Visit: Payer: Self-pay

## 2021-02-08 ENCOUNTER — Ambulatory Visit: Payer: Medicare HMO | Admitting: Pharmacist

## 2021-02-08 ENCOUNTER — Other Ambulatory Visit (HOSPITAL_COMMUNITY)
Admission: RE | Admit: 2021-02-08 | Discharge: 2021-02-08 | Disposition: A | Discharge status: Home / Self Care | Payer: Medicare HMO | Source: Ambulatory Visit | Attending: Student | Admitting: Student

## 2021-02-08 ENCOUNTER — Ambulatory Visit: Payer: Medicare HMO | Admitting: Internal Medicine

## 2021-02-08 VITALS — Wt >= 6400 oz

## 2021-02-08 DIAGNOSIS — E1169 Type 2 diabetes mellitus with other specified complication: Secondary | ICD-10-CM

## 2021-02-08 DIAGNOSIS — I5032 Chronic diastolic (congestive) heart failure: Secondary | ICD-10-CM

## 2021-02-08 DIAGNOSIS — F419 Anxiety disorder, unspecified: Secondary | ICD-10-CM

## 2021-02-08 DIAGNOSIS — J449 Chronic obstructive pulmonary disease, unspecified: Secondary | ICD-10-CM

## 2021-02-08 DIAGNOSIS — I480 Paroxysmal atrial fibrillation: Secondary | ICD-10-CM

## 2021-02-08 DIAGNOSIS — F339 Major depressive disorder, recurrent, unspecified: Secondary | ICD-10-CM

## 2021-02-08 DIAGNOSIS — I10 Essential (primary) hypertension: Secondary | ICD-10-CM

## 2021-02-08 DIAGNOSIS — Z79899 Other long term (current) drug therapy: Secondary | ICD-10-CM | POA: Diagnosis not present

## 2021-02-08 LAB — BASIC METABOLIC PANEL
Anion gap: 2 — ABNORMAL LOW (ref 5–15)
BUN: 29 mg/dL — ABNORMAL HIGH (ref 6–20)
CO2: 37 mmol/L — ABNORMAL HIGH (ref 22–32)
Calcium: 8.3 mg/dL — ABNORMAL LOW (ref 8.9–10.3)
Chloride: 95 mmol/L — ABNORMAL LOW (ref 98–111)
Creatinine, Ser: 1.55 mg/dL — ABNORMAL HIGH (ref 0.61–1.24)
GFR, Estimated: 55 mL/min — ABNORMAL LOW (ref >60)
Glucose, Bld: 210 mg/dL — ABNORMAL HIGH (ref 70–99)
Potassium: 3.6 mmol/L (ref 3.5–5.1)
Sodium: 134 mmol/L — ABNORMAL LOW (ref 135–145)

## 2021-02-08 LAB — CBC
HCT: 36.3 % — ABNORMAL LOW (ref 39.0–52.0)
Hemoglobin: 11.1 g/dL — ABNORMAL LOW (ref 13.0–17.0)
MCH: 26.7 pg (ref 26.0–34.0)
MCHC: 30.6 g/dL (ref 30.0–36.0)
MCV: 87.3 fL (ref 80.0–100.0)
Platelets: 256 10*3/uL (ref 150–400)
RBC: 4.16 MIL/uL — ABNORMAL LOW (ref 4.22–5.81)
RDW: 19.1 % — ABNORMAL HIGH (ref 11.5–15.5)
WBC: 10.1 10*3/uL (ref 4.0–10.5)
nRBC: 0 % (ref 0.0–0.2)

## 2021-02-08 MED ORDER — RYBELSUS 14 MG PO TABS: 14.0000 mg | ORAL_TABLET | Freq: Every morning | ORAL | 0 refills | Status: DC

## 2021-02-08 NOTE — Patient Instructions (Signed)
Reginald [name],  It was great to talk to you today!  To summarize our discussion today: You have been approved for Jardiance patient assistance (BI Cares) through 05/21/21. We will need to re-enroll toward the end of the year  I submitted an application for patient assistance through [facility] for [place]. This is the inhaler device I showed you how to use today. This will hopefully arrive at your home within 2 weeks. You will use this as a controlled inhaler not to be used for acute shortness of breath. You will inhale 2 puffs of Spiriva respimat once every day. We sent in Rybelsus to your local pharmacy for you to use your voucher.   Please call me with any questions or concerns.   Visit Information  PATIENT GOALS:  Goals Addressed             This Visit's Progress    Medication Management       Patient Goals/Self-Care Activities Over the next 90 days, patient will:  take medications as prescribed check glucose twice daily (fasting and 2 hours after dinner), document, and provide at future appointments check blood pressure at least once daily, document, and provide at future appointments weigh daily, and contact provider if weight gain of 3 lbs in a day or more than 5 lbs in a week                Patient verbalizes understanding of instructions provided today and agrees to view in [place].   Telephone follow up appointment with care management team member scheduled for:02/16/21  Reginald [name], PharmD Clinical Pharmacist [facility] [phone]

## 2021-02-08 NOTE — Telephone Encounter (Signed)
ATC [facility]. Spoke to an Mining engineer who transferred the call to RT. No answer from RT, LVM to call the [place] office for information to get the DL.

## 2021-02-08 NOTE — Telephone Encounter (Signed)
Pt not in care orchestrator or resmed  Called pt to verify DME Pt uses [facility] Phone: [phone]  [place] and had to [facility]- left msg asking for them to fax DL and call and let us know status of this request.

## 2021-02-08 NOTE — Chronic Care Management (AMB) (Signed)
Chronic Care Management Pharmacy Note  02/08/2021 Name:  Reginald [name] MRN:  [id number] DOB:  08-19-1970  Summary:  Semaglutide (Rybelsus) 14 mg refill in process with [facility]. Called on 02/07/21 and latest update was that the medication was still in the process of being prepared and had not yet been shipped. Given delayed shipment, [employer]ord[employer] has supplied patient with voucher to receive 90 day supply at local pharmacy. Prescription sent to local pharmacy.  Patient has been approved for Jardiance through [employer] through 05/21/21. First shipment sent to patient's home on 02/07/21.  Discussed with PCP and in agreement to start LAMA controller therapy for COPD with Spiriva Respimat. Spiriva is covered under the [place] program so will apply for patient assistance. Patient is already an approved patient under this program. Fax sent on 02/08/21.   Subjective: Reginald [name] is an 50 y.o. year old male who is a primary patient of [name], MD.  The CCM team was consulted for assistance with disease management and care coordination needs.    Engaged with patient face to face for follow up visit in response to provider referral for pharmacy case management and/or care coordination services.   Consent to Services:  The patient was given information about Chronic Care Management services, agreed to services, and gave verbal consent prior to initiation of services.  Please see initial visit note for detailed documentation.   Patient Care Team: [name], MD as PCP - General (Internal Medicine) Thompson Grayer, MD as PCP - Electrophysiology (Cardiology) Josue Hector, MD as PCP - Cardiology (Cardiology) Kassie Mends, RN as Miranda Management [place], Ranae Pila, LCSW as Social Worker (Licensed Clinical Social Worker) Beryle Lathe, [facility] (Pharmacist)  Objective:  Lab Results  Component Value Date   CREATININE 1.55 (H) 02/08/2021   CREATININE 1.39  (H) 02/02/2021   CREATININE 2.13 (H) 01/30/2021    Lab Results  Component Value Date   HGBA1C 5.4 11/12/2020   Last diabetic Eye exam: No results found for: HMDIABEYEEXA  Last diabetic Foot exam: No results found for: HMDIABFOOTEX      Component Value Date/Time   CHOL 123 06/10/2020 0837   TRIG 281 (H) 06/10/2020 0837   HDL 25 (L) 06/10/2020 0837   CHOLHDL 4.9 06/10/2020 0837   CHOLHDL 6.6 01/01/2020 1014   VLDL 73 (H) 01/01/2020 1014   LDLCALC 54 06/10/2020 0837    Hepatic Function Latest Ref Rng & Units 11/24/2020 11/15/2020 11/14/2020  Total Protein 6.0 - 8.5 g/dL 7.9 8.0 7.8  Albumin 4.0 - 5.0 g/dL 4.3 3.6 3.6  AST 0 - 40 IU/L 41(H) 32 30  ALT 0 - 44 IU/L _0 Alk Phosphatase 44 - 121 IU/L 98 76 72  Total Bilirubin 0.0 - 1.2 mg/dL 0.7 1.4(H) 1.2  Bilirubin, Direct 0.0 - 0.2 mg/dL - - -    Lab Results  Component Value Date/Time   TSH 4.060 10/04/2020 09:43 AM   TSH 5.240 (H) 06/10/2020 08:37 AM   FREET4 1.30 10/04/2020 09:43 AM   FREET4 0.79 (L) 05/03/2018 02:04 PM    CBC Latest Ref Rng & Units 02/08/2021 02/02/2021 01/30/2021  WBC 4.0 - 10.5 K/uL 10.1 12.6(H) 12.5(H)  Hemoglobin 13.0 - 17.0 g/dL 11.1(L) 11.5(L) 11.4(L)  Hematocrit 39.0 - 52.0 % 36.3(L) 37.3(L) 37.8(L)  Platelets 150 - 400 K/uL 256 290 302    No results found for: VD25OH  Clinical ASCVD: No  The ASCVD  Risk score (Arnett DK, et al., 2019) failed to calculate for the following reasons:   The valid total cholesterol range is 130 to 320 mg/dL    Social History   Tobacco Use  Smoking Status Former   Packs/day: 2.00   Years: 16.00   Pack years: 32.00   Types: Cigarettes   Quit date: 08/23/2017   Years since quitting: 3.4  Smokeless Tobacco Never   BP Readings from Last 3 Encounters:  02/04/21 138/60  02/04/21 137/71  02/02/21 (!) 146/78   Pulse Readings from Last 3 Encounters:  02/04/21 84  02/04/21 75  02/02/21 88   Wt Readings from Last 3 Encounters:  02/04/21 (!) 493 lb 4.8 oz  (223.8 kg)  02/02/21 (!) 487 lb 12.8 oz (221.3 kg)  01/31/21 (!) 480 lb (217.7 kg)    Assessment: Review of patient past medical history, allergies, medications, health status, including review of consultants reports, laboratory and other test data, was performed as part of comprehensive evaluation and provision of chronic care management services.   SDOH:  (Social Determinants of Health) assessments and interventions performed:    [place]  No Known Allergies  Medications Reviewed Today     Reviewed by Waynetta Pean (Physician Assistant Certified) on 53/61/44 at 1738  Med List Status: <None>   Medication Order Taking? Sig Documenting Provider Last Dose Status Informant  acetaminophen (TYLENOL) 325 MG tablet [id number] Yes Take 650 mg by mouth every 6 (six) hours as needed for mild pain. [provider] Taking Active Self  albuterol (PROVENTIL) (2.5 MG/3ML) 0.083% nebulizer solution [id number] Yes INHALE 1 VIAL VIA NEBULIZER EVERY 6 HOURS AS NEEDED FOR WHEEZING OR SHORTNESS OF BREATH [name], MD Taking Active   albuterol (VENTOLIN HFA) 108 (90 Base) MCG/ACT inhaler [id number] Yes INHALE 2 PUFFS INTO THE LUNGS EVERY 6 (SIX) HOURS AS NEEDED FOR WHEEZING OR SHORTNESS OF BREATH. [name], MD Taking Active            Med Note Lia Hopping, Toniann Fail   Fri Jan 21, 2021  8:16 AM)    allopurinol (ZYLOPRIM) 300 MG tablet [id number] Yes TAKE 1 TABLET EVERY DAY [name], MD Taking Active Self  ammonium lactate (AMLACTIN) 12 % lotion [id number] Yes Apply 1 application topically as needed for dry skin. Felipa Furnace, DPM Taking Active Self  atorvastatin (LIPITOR) 40 MG tablet [id number] Yes TAKE 1 TABLET EVERY DAY [name], MD Taking Active Self  Blood Glucose Monitoring Suppl (TRUE METRIX METER) w/Device KIT [id number] Yes USE AS DIRECTED [name], MD Taking Active Self  buPROPion (WELLBUTRIN XL) 150 MG 24 hr tablet [id number] Yes Take 1 tablet (150 mg  total) by mouth daily. [name], MD Taking Active   buPROPion (WELLBUTRIN XL) 300 MG 24 hr tablet [id number] Yes Take 1 tablet (300 mg total) by mouth daily. [name], MD Taking Active   diclofenac Sodium (VOLTAREN) 1 % GEL [id number] Yes Apply 2 g topically 4 (four) times daily. Mordecai Rasmussen, MD Taking Active Self  docusate sodium (COLACE) 100 MG capsule [id number] Yes Take 2 capsules (200 mg total) by mouth 2 (two) times daily. Thurnell Lose, MD Taking Active   empagliflozin (JARDIANCE) 10 MG TABS tablet [id number]  Take 1 tablet (10 mg total) by mouth daily before breakfast. Ahmed Prima, Fransisco Hertz, PA-C  Active            Med Note Waldo Laine, [place] J   Thu  Feb 03, 2021  1:56 PM) Currently has samples from his cardiology provider  FEROSUL 325 (65 Fe) MG tablet [id number] Yes TAKE 1 TABLET EVERY DAY [name], MD Taking Active   fexofenadine (ALLEGRA) 180 MG tablet [id number] Yes Take 180 mg by mouth daily. [provider] Taking Active   gabapentin (NEURONTIN) 100 MG capsule [id number] Yes Take 1 capsule in the morning and 2 capsules in the evening. Thurnell Lose, MD Taking Active   hydrOXYzine (ATARAX/VISTARIL) 25 MG tablet [id number] Yes Take 1 tablet (25 mg total) by mouth every 8 (eight) hours as needed. [name], MD Taking Active   Krill Oil 350 MG CAPS [id number] Yes Take 1 capsule by mouth in the morning and at bedtime.  [provider] Taking Active Self           Med Note Lia Hopping, MINDY L   Fri Jan 21, 2021  8:16 AM)    lisinopril (ZESTRIL) 20 MG tablet [id number] Yes Take 1 tablet (20 mg total) by mouth daily. Erma Heritage, PA-C Taking Active Self  loratadine (CLARITIN) 10 MG tablet [id number] Yes Take 10 mg by mouth daily as needed for allergies. [provider] Taking Active   metFORMIN (GLUCOPHAGE) 850 MG tablet [id number] Yes Take 1 tablet (850 mg total) by mouth 2 (two) times daily with a meal. [name], MD Taking  Active Self  metolazone (ZAROXOLYN) 5 MG tablet [id number] Yes TAKE 1 TABLET (5 MG TOTAL) BY MOUTH THREE TIMES A WEEK. Thompson Grayer, MD Taking Active   metoprolol tartrate (LOPRESSOR) 100 MG tablet [id number] Yes TAKE 1 TABLET TWICE DAILY Josue Hector, MD Taking Active   pantoprazole (PROTONIX) 40 MG tablet [id number] Yes Take 1 tablet (40 mg total) by mouth daily. [name], MD Taking Active Self  penicillin v potassium (VEETID) 500 MG tablet [id number] Yes Take 1 tablet (500 mg total) by mouth 4 (four) times daily for 7 days. Couture, Cortni S, PA-C Taking Active   polyethylene glycol (MIRALAX) 17 g packet [id number] Yes Take 17 g by mouth daily as needed. Thurnell Lose, MD Taking Active   potassium chloride SA (KLOR-CON) 20 MEQ tablet [id number] Yes TAKE 3 TABS DAILY AND TAKE AN ADDITIONAL 1 TAB ON THE DAY YOU TAKE YOUR WEEKLY METOLAZONE DOSE Ahmed Prima, Fransisco Hertz, PA-C Taking Active   rivaroxaban (XARELTO) 20 MG TABS tablet [id number] Yes TAKE 1 Tablet BY MOUTH ONCE EVERY DAY WITH SUPPER Josue Hector, MD Taking Active Self  Semaglutide (RYBELSUS) 14 MG TABS [id number] Yes Take 14 mg by mouth in the morning. [provider] Taking Active            Med Note Lia Hopping, MINDY L   Fri Jan 21, 2021  8:16 AM)    terbinafine (LAMISIL) 250 MG tablet [id number] Yes Take 1 tablet by mouth once daily [name], MD Taking Active   Tiotropium Bromide Monohydrate (SPIRIVA RESPIMAT) 2.5 MCG/ACT AERS [id number] Yes Inhale 2 puffs into the lungs daily. [name], MD Taking Active   torsemide [facility] 20 MG tablet [id number]  Take 2 tablets (40 mg total) by mouth 2 (two) times daily. Erma Heritage, [place]  Active   TRUE METRIX BLOOD GLUCOSE TEST test strip [id number] Yes TEST ONE TIME DAILY FOR DIABETES [name], MD Taking Active Self  TRUEplus Lancets 33G MISC [id number] Yes USE TO TEST ONE TIME DAILY FOR DIABETES [name], MD Taking Active Self  Patient  Active Problem List   Diagnosis Date Noted   Rectal bleeding    Chronic blood loss anemia 11/12/2020   Gastrointestinal hemorrhage 10/08/2020   S/P placement of cardiac pacemaker 02/10/2020   Encounter for examination following treatment at hospital 02/10/2020   Anxiety 02/10/2020   DM2 (diabetes mellitus, type 2) ([place]) 02/10/2020   OSA on CPAP    Abnormal liver function 03/16/2019   History of pulmonary embolism 03/16/2019   Paroxysmal atrial fibrillation ([place]) 09/05/2018   COPD (chronic obstructive pulmonary disease) ([place]) 05/07/2018   Gastroesophageal reflux disease    Pulmonary embolism ([place]) 09/18/2017   Encounter for therapeutic drug monitoring 09/04/2017   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 08/23/2017   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   Pulmonary hypertension (HCC)    Chronic periodontitis 07/18/2017   Morbid obesity ([place])    Essential hypertension    Tobacco abuse    Chronic heart failure with preserved ejection fraction (HFpEF) ([place])    Chronic venous insufficiency     Immunization History  Administered Date(s) Administered   Influenza Inj Mdck Quad Pf 02/27/2019   Influenza,inj,Quad PF,6+ Mos 02/10/2020   Janssen (J&J) SARS-COV-2 Vaccination 09/16/2019   PFIZER(Purple Top)SARS-COV-2 Vaccination 03/15/2020   Pneumococcal Polysaccharide-23 03/17/2019    Conditions to be addressed/monitored: Atrial Fibrillation, CHF, HTN, COPD, DMII, Anxiety, and Depression  Care Plan : Medication Management  Updates made by Beryle Lathe, [place] Hevia since 02/08/2021 12:00 AM     Problem: COPD, HFpEF, HTN, T2DM, Afib, anxiety/depression   Priority: High  Onset Date: 12/28/2020     Long-Range Goal: Disease Progression Prevention   Start Date: 12/28/2020  Expected End Date: 03/28/2021  Recent Progress: On track  Priority: High  Note:   Current Barriers:  Unable to independently monitor therapeutic  efficacy  Pharmacist Clinical Goal(s):  Over the next 90 days, patient will achieve adherence to monitoring guidelines and medication adherence to achieve therapeutic efficacy through collaboration with PharmD and provider.   Interventions: 1:1 collaboration with [name], MD regarding development and update of comprehensive plan of care as evidenced by provider attestation and co-signature Inter-disciplinary care team collaboration (see longitudinal plan of care) Comprehensive medication review performed; medication list updated in electronic medical record  Diabetes: Current medications: metformin 850 mg by mouth twice daily and Jardiance 10 mg by mouth daily (currently has samples from his cardiology provider) Semaglutide (Rybelsus) 14 mg refill in process with [employer]. Called on 02/07/21 and latest update was that the medication was still in the process of being prepared and had not yet been shipped. Given delayed shipment, NovoNordisk has supplied patient with voucher to receive 90 day supply at local pharmacy.  Intolerances: none Taking medications as directed: yes Side effects thought to be attributed to current medication regimen: GI upset with metformin but ok with continuing for now given benefits Patient denies hypoglycemia Current meal patterns: breakfast: eggs, oatmeal, chicken sandwhich, and baked beans ; lunch:  ribeye and [employer] Breast in air fryer ; dinner:  leftovers from lunch ; snacks: none; drinks: water, regular soda, milk, and sports drinks Has noticed decreased appetite since starting Rybelsus Current exercise: none Controlled; Most recent A1c at goal of <7% per ADA guidelines Current glucose readings over last week: fasting 180-230s; bedtime 287 Medication: Identify diabetes medication and when best taken Monitoring: target blood sugar range, when to test Hypoglycemia: I have discussed with the patient how to treat hypoglycemia by the  rule of 15;  eat/drink 15g of sugar in the form of glucose tabs, 4 ounces of juice or soda and recheck fingerstick glucose in 15 minutes. Chocolate bars and ice cream should be avoided because fat delays carbohydrate digestion and absorption. Retreat if glucose remains low. Driving should cease until glucose is normal. Continue metformin 850 mg by mouth twice daily, Jardiance 10 mg by mouth daily and semaglutide (Rybelsus) 14 mg by mouth at least 30 minutes before the first food, beverage, or other oral medications of the day with no more than 4 ounces of plain water only Patient receives Rybelsus from [place] Patient has been approved for Jardiance through [employer] through 05/21/21. First shipment sent to patient's home on 02/07/21 Instructed to monitor blood sugars once a day at the following times: fasting (at least 8 hours since last food consumption), 2 hours after dinner, and whenever patient experiences symptoms of hypo/hyperglycemia Encouraged regular aerobic exercise with a goal of 30 minutes five times per week (150 minutes per week)  Hypertension: Current medications: lisinopril 20 mg by mouth once daily Patient also takes diuretics (torsemide + metolazone as needed) Intolerances: none Taking medications as directed: yes Side effects thought to be attributed to current medication regimen: no Reports some orthostatic hypotension but knows to get up slowly Current exercise: none Home blood pressure readings over last week: 130-140s/80s; had hypotensive episode complicated by dehydration about 2 weeks ago Blood pressure under fair control. Factors affecting control of BP include high salt intake, elevated BMI, lack of exercise, sleep apnea, and volume overload. Blood pressure is at goal of <130/80 mmHg per 2017 AHA/ACC guidelines. Continue lisinopril 20 mg by mouth once daily Encourage dietary sodium restriction/DASH diet Recommend regular aerobic exercise Recommend home blood pressure monitoring,  to bring results in next visit Discussed need for and importance of continued work on weight loss Discussed need for medication compliance Reviewed risks of hypertension, principles of treatment and consequences of untreated hypertension Patient received CPAP on 01/20/21 which will likely improve blood pressure   Heart failure with preserved ejection fraction (LVEF >50% with evidence of spontaneous or provokable increased LV filling pressures): Appropriately managed; added SGLT2 inhibitor in August 2022 Has pacemaker Current treatment: lisinopril 20 mg by mouth once daily, metoprolol tartrate 100 mg by mouth twice daily, and empagliflozin (Jardiance) 10 mg by mouth once daily Diuretic therapy: Torsemide 20 mg by mouth twice daily and metolazone as needed   Stage C (Symptomatic heart failure)/NYHA Class III (Marked limitation of physical activity. Comfortable at rest. Less than ordinary activity causes fatigue, palpitation, or dyspnea) Most recent echocardiogram was in March 2022. LVEF was stable at 60-65%. BNP: 67 (01/30/21) Current home vitals: BP 130-140s/80s Current home weights: large fluctuations (lately have been in the 480-490s Reports shortness of breath with activity or when lying down, fatigue and weakness, swelling in the legs, ankles and feet, and reduced ability to exercise. Denies persistent cough or wheezing, nausea and lack of appetite, and chest pain Continue Jardiance 10 mg by mouth daily, lisinopril 20 mg by mouth once daily, and metoprolol tartrate 100 mg by mouth twice daily Follow cardiology recommendations regarding torsemide and metolazone Continue to watch serum creatinine and potassium levels closely Encourage dietary sodium restriction (<3 g/day) Educated on the importance of weighing daily. Patient aware to contact cardiology/primary care team if weight gain >3 lbs in 1 day or >5 lbs in 1 week Recommend home blood pressure monitoring, to bring results in next  visit Discussed need for  and importance of continued work on weight loss Discussed need for medication compliance Recommend restricted fluid intake (eg, 2 L/day)  Chronic Obstructive Pulmonary Disease: Uncontrolled - only uses albuterol as needed Patient now has CPAP machine Stopped smoking 08/23/2017; smoked for 30 years (1 PPD) Current treatment: albuterol metered dose (ProAir, Ventolin, Proventil) 1 puff by mouth as needed for shortness of breath; only using ~2 times per week GOLD Classification: unknown CAT score (12/28/20): 23 Most recent Pulmonary Function Testing: N/A 0 exacerbations requiring treatment in the last 6 months  Current oxygen requirements: none Discussed with PCP and in agreement to start LAMA controller therapy with Spiriva Respimat. Spiriva is covered under the [place] program so will apply for patient assistance. Patient is already an approved patient under this program   Patient was instructed on the appropriate inhaler technique for Spiriva Respimat on 02/08/21  Atrial Fibrillation: Controlled Current rate control: metoprolol tartrate 100 mg by mouth twice daily Most recent ECG: normal sinus Anticoagulation: rivaroxaban (Xarelto) 20 mg by mouth once daily Denies signs and symptoms of bleeding CHADS2VASc score: 3 (CHF, hypertension, and diabetes) Prior ablation: _0  Yes  _1  No Home blood pressure: not checking Home heart rate: not checking Continue metoprolol  Continue rivaroxaban (Xarelto) 20 mg by mouth once daily Encouraged regular physical activity for general health benefits Recommend home blood pressure and heart rate monitoring, to bring results in next visit Discussed need for medication compliance Patient may qualify for patient assistance for Xarelto after he has spent at least 4% of his gross annual income at the pharmacy.  Anxiety/Depression: Improving per patient. Talking to his providers helps him. Has not cried recently which is an improvement  and has had less outbursts of anger. Current medication: bupropion XL 150 mg by mouth daily and hydroxyzine 25 mg by mouth every 8 hours  Recently discontinued citalopram and buspirone since patient did not feel they were working well for him.  Patient reports tolerating bupropion well. Plan to titrate bupropion to 300 mg daily after 4 weeks on lower dose.   Patient Goals/Self-Care Activities Over the next 90 days, patient will:  take medications as prescribed check glucose twice daily (fasting and 2 hours after dinner), document, and provide at future appointments check blood pressure at least once daily, document, and provide at future appointments weigh daily, and contact provider if weight gain of 3 lbs in a day or more than 5 lbs in a week  Follow Up Plan: Telephone follow up appointment with care management team member scheduled for: 02/16/21      Medication Assistance:  See care plan for more details  Patient's preferred pharmacy is:  [facility] [address], [place] [address] Phone: [phone] Fax: [facility] - [place], [place] [address] Phone: [phone] Fax: [phone]  Follow Up:  Patient agrees to Care Plan and Follow-up.  Plan: Telephone follow up appointment with care management team member scheduled for:  02/16/21  Kennon Holter, PharmD Clinical Pharmacist [facility] [phone]

## 2021-02-08 NOTE — Telephone Encounter (Signed)
Pt medication sent to [facility]

## 2021-02-08 NOTE — Telephone Encounter (Signed)
   He was at 493 lbs on the day of his office visit, therefore already down 8 lbs. Let's wait for the results of his BMET prior to giving the green light for more Metolazone. If renal function stable, will likely have him take an additional tablet this week.  Signed, Erma Heritage, PA-C 02/08/2021, 7:40 AM

## 2021-02-08 NOTE — Telephone Encounter (Signed)
Reginald [name] going this am for bmet. I will call him with results.

## 2021-02-09 ENCOUNTER — Telehealth: Payer: Self-pay

## 2021-02-09 ENCOUNTER — Other Ambulatory Visit: Payer: Self-pay | Admitting: Student

## 2021-02-09 MED ORDER — LISINOPRIL 20 MG PO TABS: 20.0000 mg | ORAL_TABLET | Freq: Every day | ORAL | 3 refills | Status: DC

## 2021-02-09 NOTE — Telephone Encounter (Signed)
Resent Lisinopril to [facility] per pt request.

## 2021-02-11 ENCOUNTER — Telehealth: Payer: Self-pay | Admitting: Student

## 2021-02-11 NOTE — Telephone Encounter (Signed)
Patient took metolazone 3 days ago and will take another dose today. He will call back with next week with update.

## 2021-02-11 NOTE — Telephone Encounter (Signed)
     Please confirm when he last took Metolazone  If it has been more than 2 days, he could utilize this again. If he just took it yesterday, would at least wait until tomorrow given his variable renal function.  Signed, Erma Heritage, PA-C 02/11/2021, 2:29 PM

## 2021-02-11 NOTE — Telephone Encounter (Signed)
Patient called stating that he continues to gain weight. States that he is tight in his chest and lower part of stomach.  Starts that he continues to swell In his feet, legs.

## 2021-02-11 NOTE — Telephone Encounter (Signed)
Pt stated that his feet are like basketballs and are very painful. Pt has taken second dose of torsemide for the day, but would like to know if he can take metolazone tablet to help with the extra fluid gain. Pt is unsure of how much fluid he has retained d/t pt not being at home.  Please advise.

## 2021-02-11 NOTE — Telephone Encounter (Signed)
ATC patient. No VM available.  

## 2021-02-13 ENCOUNTER — Other Ambulatory Visit: Payer: Self-pay | Admitting: Internal Medicine

## 2021-02-13 DIAGNOSIS — E1159 Type 2 diabetes mellitus with other circulatory complications: Secondary | ICD-10-CM

## 2021-02-16 ENCOUNTER — Other Ambulatory Visit: Payer: Self-pay | Admitting: *Deleted

## 2021-02-16 ENCOUNTER — Encounter: Payer: Self-pay | Admitting: Internal Medicine

## 2021-02-16 ENCOUNTER — Ambulatory Visit: Payer: Medicare HMO | Admitting: Pharmacist

## 2021-02-16 DIAGNOSIS — I5032 Chronic diastolic (congestive) heart failure: Secondary | ICD-10-CM

## 2021-02-16 DIAGNOSIS — F339 Major depressive disorder, recurrent, unspecified: Secondary | ICD-10-CM

## 2021-02-16 DIAGNOSIS — J349 Unspecified disorder of nose and nasal sinuses: Secondary | ICD-10-CM

## 2021-02-16 DIAGNOSIS — I480 Paroxysmal atrial fibrillation: Secondary | ICD-10-CM

## 2021-02-16 DIAGNOSIS — E1169 Type 2 diabetes mellitus with other specified complication: Secondary | ICD-10-CM

## 2021-02-16 DIAGNOSIS — F419 Anxiety disorder, unspecified: Secondary | ICD-10-CM

## 2021-02-16 DIAGNOSIS — J449 Chronic obstructive pulmonary disease, unspecified: Secondary | ICD-10-CM

## 2021-02-16 DIAGNOSIS — I10 Essential (primary) hypertension: Secondary | ICD-10-CM

## 2021-02-16 NOTE — Telephone Encounter (Signed)
ATC patient again. No VM available.

## 2021-02-16 NOTE — Chronic Care Management (AMB) (Signed)
Chronic Care Management Pharmacy Note  02/16/2021 Name:  Reginald [name] MRN:  [id number] DOB:  11/15/1970  Summary:  Blood glucose has significantly improved since restarting back on Rybelsus Weights have been stable in the 480s Patient has now increased to bupropion XL 300 mg by mouth daily  Patient reports not sleeping well and is wondering if he has some sort of nasal obstruction. Plans to ask his PCP for ENT referral Received fax from [facility] requiring more information on prescription sent for Spiriva. Rx has been updated and faxed to [facility] for follow-up processing. Will continue to follow.   Subjective: Reginald [name] is an 50 y.o. year old male who is a primary patient of [name], MD.  The CCM team was consulted for assistance with disease management and care coordination needs.    Engaged with patient by telephone for follow up visit in response to provider referral for pharmacy case management and/or care coordination services.   Consent to Services:  The patient was given information about Chronic Care Management services, agreed to services, and gave verbal consent prior to initiation of services.  Please see initial visit note for detailed documentation.   Patient Care Team: [name], MD as PCP - General (Internal Medicine) Thompson Grayer, MD as PCP - Electrophysiology (Cardiology) Josue Hector, MD as PCP - Cardiology (Cardiology) Kassie Mends, RN as [place] [facility] [place], Ranae Pila, LCSW as Social Worker (Licensed Clinical Social Worker) Beryle Lathe, [facility] (Pharmacist)  Objective:  Lab Results  Component Value Date   CREATININE 1.55 (H) 02/08/2021   CREATININE 1.39 (H) 02/02/2021   CREATININE 2.13 (H) 01/30/2021    Lab Results  Component Value Date   HGBA1C 5.4 11/12/2020   Last diabetic Eye exam: No results found for: HMDIABEYEEXA  Last diabetic Foot exam: No results found for: HMDIABFOOTEX       Component Value Date/Time   CHOL 123 06/10/2020 0837   TRIG 281 (H) 06/10/2020 0837   HDL 25 (L) 06/10/2020 0837   CHOLHDL 4.9 06/10/2020 0837   CHOLHDL 6.6 01/01/2020 1014   VLDL 73 (H) 01/01/2020 1014   LDLCALC 54 06/10/2020 0837    Hepatic Function Latest Ref Rng & Units 11/24/2020 11/15/2020 11/14/2020  Total Protein 6.0 - 8.5 g/dL 7.9 8.0 7.8  Albumin 4.0 - 5.0 g/dL 4.3 3.6 3.6  AST 0 - 40 IU/L 41(H) 32 30  ALT 0 - 44 IU/L '27 23 23  ' Alk Phosphatase 44 - 121 IU/L 98 76 72  Total Bilirubin 0.0 - 1.2 mg/dL 0.7 1.4(H) 1.2  Bilirubin, Direct 0.0 - 0.2 mg/dL - - -    Lab Results  Component Value Date/Time   TSH 4.060 10/04/2020 09:43 AM   TSH 5.240 (H) 06/10/2020 08:37 AM   FREET4 1.30 10/04/2020 09:43 AM   FREET4 0.79 (L) 05/03/2018 02:04 PM    CBC Latest Ref Rng & Units 02/08/2021 02/02/2021 01/30/2021  WBC 4.0 - 10.5 K/uL 10.1 12.6(H) 12.5(H)  Hemoglobin 13.0 - 17.0 g/dL 11.1(L) 11.5(L) 11.4(L)  Hematocrit 39.0 - 52.0 % 36.3(L) 37.3(L) 37.8(L)  Platelets 150 - 400 K/uL 256 290 302    No results found for: VD25OH  Clinical ASCVD: No  The ASCVD Risk score (Arnett DK, et al., 2019) failed to calculate for the following reasons:   The valid total cholesterol range is 130 to 320 mg/dL     Social History   Tobacco Use  Smoking  Status Former   Packs/day: 2.00   Years: 16.00   Pack years: 32.00   Types: Cigarettes   Quit date: 08/23/2017   Years since quitting: 3.4  Smokeless Tobacco Never   BP Readings from Last 3 Encounters:  02/04/21 138/60  02/04/21 137/71  02/02/21 (!) 146/78   Pulse Readings from Last 3 Encounters:  02/04/21 84  02/04/21 75  02/02/21 88   Wt Readings from Last 3 Encounters:  02/08/21 (!) 493 lb (223.6 kg)  02/04/21 (!) 493 lb 4.8 oz (223.8 kg)  02/02/21 (!) 487 lb 12.8 oz (221.3 kg)    Assessment: Review of patient past medical history, allergies, medications, health status, including review of consultants reports, laboratory and other  test data, was performed as part of comprehensive evaluation and provision of chronic care management services.   SDOH:  (Social Determinants of Health) assessments and interventions performed:    CCM Care Plan  No Known Allergies  Medications Reviewed Today     Reviewed by Beryle Lathe, [facility] (Pharmacist) on 02/16/21 at 1354  Med List Status: <None>   Medication Order Taking? Sig Documenting Provider Last Dose Status Informant  acetaminophen (TYLENOL) 325 MG tablet [id number] Yes Take 650 mg by mouth every 6 (six) hours as needed for mild pain. [provider] Taking Active Self  albuterol (PROVENTIL) (2.5 MG/3ML) 0.083% nebulizer solution [id number] Yes INHALE 1 VIAL VIA NEBULIZER EVERY 6 HOURS AS NEEDED FOR WHEEZING OR SHORTNESS OF BREATH [name], MD Taking Active   albuterol (VENTOLIN HFA) 108 (90 Base) MCG/ACT inhaler [id number] Yes INHALE 2 PUFFS INTO THE LUNGS EVERY 6 (SIX) HOURS AS NEEDED FOR WHEEZING OR SHORTNESS OF BREATH. [name], MD Taking Active            Med Note Lia Hopping, Toniann Fail   Fri Jan 21, 2021  8:16 AM)    allopurinol (ZYLOPRIM) 300 MG tablet [id number] Yes TAKE 1 TABLET EVERY DAY [name], MD Taking Active Self  ammonium lactate (AMLACTIN) 12 % lotion [id number] Yes Apply 1 application topically as needed for dry skin. Felipa Furnace, DPM Taking Active Self  atorvastatin (LIPITOR) 40 MG tablet [id number] Yes TAKE 1 TABLET EVERY DAY [name], MD Taking Active   Blood Glucose Monitoring Suppl (TRUE METRIX METER) w/Device KIT [id number]  USE AS DIRECTED [name], MD  Active Self  buPROPion (WELLBUTRIN XL) 300 MG 24 hr tablet [id number] Yes Take 1 tablet (300 mg total) by mouth daily. [name], MD Taking Active   diclofenac Sodium (VOLTAREN) 1 % GEL [id number] Yes Apply 2 g topically 4 (four) times daily. Mordecai Rasmussen, MD Taking Active Self  docusate sodium (COLACE) 100 MG capsule [id number] Yes Take 2 capsules (200 mg  total) by mouth 2 (two) times daily. Thurnell Lose, MD Taking Active   empagliflozin (JARDIANCE) 10 MG TABS tablet [id number] Yes Take 1 tablet (10 mg total) by mouth daily before breakfast. Ahmed Prima Fransisco Hertz, PA-C Taking Active            Med Note Jim Like Feb 03, 2021  1:56 PM) Currently has samples from his cardiology provider  FEROSUL 325 (65 Fe) MG tablet [id number] Yes TAKE 1 TABLET EVERY DAY [name], MD Taking Active   fexofenadine (ALLEGRA) 180 MG tablet [id number] Yes Take 180 mg by mouth daily. [provider] Taking Active   gabapentin (NEURONTIN) 100 MG capsule [id number] Yes Take 1 capsule in  the morning and 2 capsules in the evening. Thurnell Lose, MD Taking Active   hydrOXYzine (ATARAX/VISTARIL) 25 MG tablet [id number] Yes Take 1 tablet (25 mg total) by mouth every 8 (eight) hours as needed. [name], MD Taking Active   Krill Oil 350 MG CAPS [id number] Yes Take 1 capsule by mouth in the morning and at bedtime.  [provider] Taking Active Self           Med Note Lia Hopping, MINDY L   Fri Jan 21, 2021  8:16 AM)    lisinopril (ZESTRIL) 20 MG tablet [id number] Yes Take 1 tablet (20 mg total) by mouth daily. Ahmed Prima [place] M, PA-C Taking Active   loratadine (CLARITIN) 10 MG tablet [id number] No Take 10 mg by mouth daily as needed for allergies.  Patient not taking: No sig reported   [provider] Not Taking Active   metFORMIN (GLUCOPHAGE) 850 MG tablet [id number] Yes Take 1 tablet (850 mg total) by mouth 2 (two) times daily with a meal. [name], MD Taking Active Self  metolazone (ZAROXOLYN) 5 MG tablet [id number] Yes TAKE 1 TABLET (5 MG TOTAL) BY MOUTH THREE TIMES A WEEK. Thompson Grayer, MD Taking Active   metoprolol tartrate (LOPRESSOR) 100 MG tablet [id number] Yes TAKE 1 TABLET TWICE DAILY Josue Hector, MD Taking Active   pantoprazole (PROTONIX) 40 MG tablet [id number] Yes Take 1 tablet (40 mg total) by mouth  daily. [name], MD Taking Active Self  polyethylene glycol (MIRALAX) 17 g packet [id number] Yes Take 17 g by mouth daily as needed. Thurnell Lose, MD Taking Active   potassium chloride SA (KLOR-CON) 20 MEQ tablet [id number] Yes TAKE 3 TABS DAILY AND TAKE AN ADDITIONAL 1 TAB ON THE DAY YOU TAKE YOUR WEEKLY METOLAZONE DOSE Ahmed Prima, Fransisco Hertz, PA-C Taking Active   rivaroxaban (XARELTO) 20 MG TABS tablet [id number] Yes TAKE 1 Tablet BY MOUTH ONCE EVERY DAY WITH SUPPER Josue Hector, MD Taking Active Self  Semaglutide (RYBELSUS) 14 MG TABS [id number] Yes Take 14 mg by mouth in the morning. [name], MD Taking Active   terbinafine (LAMISIL) 250 MG tablet [id number] Yes Take 1 tablet by mouth once daily [name], MD Taking Active   Tiotropium Bromide Monohydrate (SPIRIVA RESPIMAT) 2.5 MCG/ACT AERS [id number] No Inhale 2 puffs into the lungs daily.  Patient not taking: No sig reported   [name], MD Not Taking Active   torsemide (DEMADEX) 20 MG tablet [id number] Yes Take 2 tablets (40 mg total) by mouth 2 (two) times daily. Erma Heritage, [place] Taking Active   TRUE METRIX BLOOD GLUCOSE TEST test strip [id number]  TEST ONE TIME DAILY FOR DIABETES [name], MD  Active Self  TRUEplus Lancets 33G MISC [id number]  USE TO TEST ONE TIME DAILY FOR DIABETES [name], MD  Active Self            Patient Active Problem List   Diagnosis Date Noted   Rectal bleeding    Chronic blood loss anemia 11/12/2020   Gastrointestinal hemorrhage 10/08/2020   S/P placement of cardiac pacemaker 02/10/2020   Encounter for examination following treatment at hospital 02/10/2020   Anxiety 02/10/2020   DM2 (diabetes mellitus, type 2) ([place]) 02/10/2020   OSA on CPAP    Abnormal liver function 03/16/2019   History of pulmonary embolism 03/16/2019   Paroxysmal atrial fibrillation ([place]) 09/05/2018   COPD (chronic obstructive pulmonary disease) ([place]) 05/07/2018   Gastroesophageal  reflux disease    Pulmonary embolism ([place]) 09/18/2017   Encounter for therapeutic drug monitoring 09/04/2017   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 08/23/2017   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   Pulmonary hypertension ([place])    Chronic periodontitis 07/18/2017   Morbid obesity ([place])    Essential hypertension    Tobacco abuse    Chronic heart failure with preserved ejection fraction (HFpEF) ([place])    Chronic venous insufficiency     Immunization History  Administered Date(s) Administered   Influenza Inj Mdck Quad Pf 02/27/2019   Influenza,inj,Quad PF,6+ Mos 02/10/2020   Janssen (J&J) SARS-COV-2 Vaccination 09/16/2019   PFIZER(Purple Top)SARS-COV-2 Vaccination 03/15/2020   Pneumococcal Polysaccharide-23 03/17/2019    Conditions to be addressed/monitored: Atrial Fibrillation, CHF, HTN, COPD, DMII, Anxiety, and Depression  Care Plan : Medication Management  Updates made by Beryle Lathe, [place] since 02/16/2021 12:00 AM     Problem: COPD, HFpEF, HTN, T2DM, Afib, anxiety/depression   Priority: High  Onset Date: 12/28/2020     Long-Range Goal: Disease Progression Prevention   Start Date: 12/28/2020  Expected End Date: 03/28/2021  Recent Progress: On track  Priority: High  Note:   Current Barriers:  Unable to independently monitor therapeutic efficacy  Pharmacist Clinical Goal(s):  Over the next 90 days, patient will achieve adherence to monitoring guidelines and medication adherence to achieve therapeutic efficacy through collaboration with PharmD and provider.   Interventions: 1:1 collaboration with [name], MD regarding development and update of comprehensive plan of care as evidenced by provider attestation and co-signature Inter-disciplinary care team collaboration (see longitudinal plan of care) Comprehensive medication review performed; medication list updated in electronic medical  record  Diabetes: Current medications: metformin 850 mg by mouth twice daily, Jardiance 10 mg by mouth daily (receives from [employer]), and semaglutide (Rybelsus) (receives from [employer]) 14 mg by mouth daily   Intolerances: none Taking medications as directed: yes Side effects thought to be attributed to current medication regimen: GI upset with metformin but ok with continuing for now given benefits Patient denies hypoglycemia Current meal patterns: breakfast: eggs, oatmeal, chicken sandwhich, and baked beans ; lunch:  ribeye and [employer] Breast in air fryer ; dinner:  leftovers from lunch ; snacks: none; drinks: water, regular soda, milk, and sports drinks Has noticed decreased appetite since starting Rybelsus. Is not having any nausea or vomiting Current exercise: none Controlled; Most recent A1c at goal of <7% per ADA guidelines Current glucose readings over last week: fasting 89-131 (184 outlier); bedtime 130-140s Medication: Identify diabetes medication and when best taken Monitoring: target blood sugar range, when to test Hypoglycemia: I have discussed with the patient how to treat hypoglycemia by the rule of 15; eat/drink 15g of sugar in the form of glucose tabs, 4 ounces of juice or soda and recheck fingerstick glucose in 15 minutes. Chocolate bars and ice cream should be avoided because fat delays carbohydrate digestion and absorption. Retreat if glucose remains low. Driving should cease until glucose is normal. Continue metformin 850 mg by mouth twice daily, Jardiance 10 mg by mouth daily and semaglutide (Rybelsus) 14 mg by mouth at least 30 minutes before the first food, beverage, or other oral medications of the day with no more than 4 ounces of plain water only Instructed to monitor blood sugars once a day at the following times: fasting (at least 8 hours since last food consumption), 2 hours after dinner, and whenever patient experiences symptoms  of  hypo/hyperglycemia Encouraged regular aerobic exercise with a goal of 30 minutes five times per week (150 minutes per week)  Hypertension: Current medications: lisinopril 20 mg by mouth once daily Patient also takes diuretics (torsemide + metolazone as needed) Intolerances: none Taking medications as directed: yes Side effects thought to be attributed to current medication regimen: no Reports some orthostatic hypotension but knows to get up slowly Current exercise: none Recent home blood pressure readings: 130-140s/80s Blood pressure under fair control. Factors affecting control of BP include high salt intake, elevated BMI, lack of exercise, sleep apnea, and volume overload. Blood pressure is at goal of <130/80 mmHg per 2017 AHA/ACC guidelines. Continue lisinopril 20 mg by mouth once daily + diuretics per cardiology instructions Encourage dietary sodium restriction/DASH diet Recommend regular aerobic exercise Recommend home blood pressure monitoring, to bring results in next visit Discussed need for and importance of continued work on weight loss Discussed need for medication compliance Reviewed risks of hypertension, principles of treatment and consequences of untreated hypertension Patient received CPAP on 01/20/21 which will likely improve blood pressure   Heart failure with preserved ejection fraction (LVEF >50% with evidence of spontaneous or provokable increased LV filling pressures): Appropriately managed; added SGLT2 inhibitor in August 2022 Has pacemaker Current treatment: lisinopril 20 mg by mouth once daily, metoprolol tartrate 100 mg by mouth twice daily, and empagliflozin (Jardiance) 10 mg by mouth once daily Diuretic therapy: Torsemide 40 mg by mouth twice daily and metolazone three times per week as needed for fluid  Stage C (Symptomatic heart failure)/NYHA Class III (Marked limitation of physical activity. Comfortable at rest. Less than ordinary activity causes fatigue,  palpitation, or dyspnea) Most recent echocardiogram was in March 2022. LVEF was stable at 60-65%. BNP: 67 (01/30/21) Current home vitals: BP 130-140s/80s Current home weights: lately have been in the 480 Reports shortness of breath with activity or when lying down, fatigue and weakness, swelling in the legs, ankles and feet, and reduced ability to exercise. Denies persistent cough or wheezing, nausea and lack of appetite, and chest pain Continue Jardiance 10 mg by mouth daily, lisinopril 20 mg by mouth once daily, and metoprolol tartrate 100 mg by mouth twice daily Follow cardiology recommendations regarding torsemide and metolazone Continue to watch serum creatinine and potassium levels closely Encourage dietary sodium restriction (<3 g/day) Educated on the importance of weighing daily. Patient aware to contact cardiology/primary care team if weight gain >3 lbs in 1 day or >5 lbs in 1 week Recommend home blood pressure monitoring, to bring results in next visit Discussed need for and importance of continued work on weight loss Discussed need for medication compliance Recommend restricted fluid intake (eg, 2 L/day)  Chronic Obstructive Pulmonary Disease: Uncontrolled - only uses albuterol as needed Patient now has CPAP machine but needs to have it read but has not heard back from pulmonology. Patient instructed to call pulmonology  Patient reports not sleeping well and is wondering if he has some sort of nasal obstruction. Plans to ask his PCP for ENT referral Stopped smoking 08/23/2017; smoked for 30 years (1 PPD) Current treatment: albuterol metered dose (ProAir, Ventolin, Proventil) 1 puff by mouth as needed for shortness of breath; only using ~2 times per week GOLD Classification: unknown CAT score (12/28/20): 23 Most recent Pulmonary Function Testing: N/A 0 exacerbations requiring treatment in the last 6 months  Current oxygen requirements: none Discussed with PCP and in agreement to start  LAMA controller therapy with Spiriva Respimat. Spiriva is covered under  the [facility] Cares program so will apply for patient assistance. Patient is already an approved patient under this program   Received fax from [facility] requiring more information on prescription sent. Rx has been updated and faxed to [facility] for follow-up processing. Patient was instructed on the appropriate inhaler technique for Spiriva Respimat on 02/08/21  Atrial Fibrillation: Controlled Current rate control: metoprolol tartrate 100 mg by mouth twice daily Most recent ECG: normal sinus Anticoagulation: rivaroxaban (Xarelto) 20 mg by mouth once daily Denies signs and symptoms of bleeding CHADS2VASc score: 3 (CHF, hypertension, and diabetes) Prior ablation: '[]'  Yes  '[x]'  No Home blood pressure: not checking Home heart rate: not checking Continue metoprolol  Continue rivaroxaban (Xarelto) 20 mg by mouth once daily Encouraged regular physical activity for general health benefits Recommend home blood pressure and heart rate monitoring, to bring results in next visit Discussed need for medication compliance Patient may qualify for patient assistance for Xarelto after he has spent at least 4% of his gross annual income at the pharmacy.  Anxiety/Depression: Improving per patient. Talking to his providers helps him. Has not cried recently which is an improvement and has had less outbursts of anger. Current medication: bupropion XL 300 mg by mouth daily (dose increased 02/16/21) and hydroxyzine 25 mg by mouth every 8 hours  Recently discontinued citalopram and buspirone since patient did not feel they were working well for him.   Patient Goals/Self-Care Activities Over the next 90 days, patient will:  take medications as prescribed check glucose twice daily (fasting and 2 hours after dinner), document, and provide at future appointments check blood pressure at least once daily, document, and provide at future appointments weigh  daily, and contact provider if weight gain of 3 lbs in a day or more than 5 lbs in a week  Follow Up Plan: Telephone follow up appointment with care management team member scheduled for: 03/03/21      Medication Assistance: see care plan for more details  Patient's preferred pharmacy is:  [facility] [address] [place] [address] Phone: [phone] Fax: [phone]  [facility] [place], [place] [address] Phone: [phone] Fax: [phone]  Follow Up:  Patient agrees to Care Plan and Follow-up.  Plan: Telephone follow up appointment with care management team member scheduled for:  03/03/21  Kennon Holter, PharmD Clinical Pharmacist [facility] [phone]

## 2021-02-16 NOTE — Patient Instructions (Signed)
Reginald [name],  It was great to talk to you today!  Please call me with any questions or concerns.   Visit Information  PATIENT GOALS:  Goals Addressed             This Visit's Progress    Medication Management       Patient Goals/Self-Care Activities Over the next 90 days, patient will:  take medications as prescribed check glucose twice daily (fasting and 2 hours after dinner), document, and provide at future appointments check blood pressure at least once daily, document, and provide at future appointments weigh daily, and contact provider if weight gain of 3 lbs in a day or more than 5 lbs in a week                  Patient verbalizes understanding of instructions provided today and agrees to view in [place].   Telephone follow up appointment with care management team member scheduled for:03/03/21  Kennon Holter, PharmD Clinical Pharmacist [facility] [phone]

## 2021-02-16 NOTE — Telephone Encounter (Signed)
"  I'm not long ago receive my CPAP from [facility] out of [place]. I've had a lot of health problems since then and around that time and still going on I can't get sleep I can't sleep at all I'm up and down all night long which part of that is the fluid pills but I don't know if it's machine could be having a problem with it I've called your office they said that you would check the machine online and then they come back and said you was couldn't get the machine online and you was going to have to call somebody or something that was last week"   Spoke with patient on the phone who states he was advised y [facility] to call our office for advice. I did tell patient what Dr. Elsworth Soho stated previously about sleepiness being related to another issue and that the cpap was working fine but patient wanted me to relay his recent mychart message to Dr. Elsworth Soho for advice on what he should do.   Dr. Elsworth Soho please advise.

## 2021-02-18 ENCOUNTER — Ambulatory Visit
Admission: EM | Admit: 2021-02-18 | Discharge: 2021-02-18 | Disposition: A | Discharge status: Home / Self Care | Payer: Medicare HMO | Attending: Internal Medicine | Admitting: Internal Medicine

## 2021-02-18 ENCOUNTER — Encounter: Payer: Self-pay | Admitting: Emergency Medicine

## 2021-02-18 ENCOUNTER — Other Ambulatory Visit: Payer: Self-pay

## 2021-02-18 DIAGNOSIS — L03115 Cellulitis of right lower limb: Secondary | ICD-10-CM | POA: Diagnosis not present

## 2021-02-18 DIAGNOSIS — I5032 Chronic diastolic (congestive) heart failure: Secondary | ICD-10-CM | POA: Diagnosis not present

## 2021-02-18 DIAGNOSIS — F339 Major depressive disorder, recurrent, unspecified: Secondary | ICD-10-CM | POA: Diagnosis not present

## 2021-02-18 DIAGNOSIS — I480 Paroxysmal atrial fibrillation: Secondary | ICD-10-CM

## 2021-02-18 DIAGNOSIS — I10 Essential (primary) hypertension: Secondary | ICD-10-CM

## 2021-02-18 DIAGNOSIS — J449 Chronic obstructive pulmonary disease, unspecified: Secondary | ICD-10-CM

## 2021-02-18 DIAGNOSIS — E1169 Type 2 diabetes mellitus with other specified complication: Secondary | ICD-10-CM

## 2021-02-18 MED ORDER — LIDOCAINE-PRILOCAINE 2.5-2.5 % EX CREA: 1.0000 "application " | TOPICAL_CREAM | CUTANEOUS | 0 refills | Status: DC | PRN

## 2021-02-18 MED ORDER — CEPHALEXIN 500 MG PO CAPS: 1000.0000 mg | ORAL_CAPSULE | Freq: Two times a day (BID) | ORAL | 0 refills | Status: AC

## 2021-02-18 NOTE — ED Triage Notes (Signed)
Red spots to bilateral lower legs for a couple of weeks

## 2021-02-18 NOTE — Discharge Instructions (Addendum)
Please use medications as prescribed If symptoms worsen please return to the urgent care to be reevaluated.

## 2021-02-19 DIAGNOSIS — G4733 Obstructive sleep apnea (adult) (pediatric): Secondary | ICD-10-CM | POA: Diagnosis not present

## 2021-02-20 NOTE — ED Provider Notes (Signed)
[facility]    CSN: [id number] Arrival date & time: 02/18/21  1559      History   Chief Complaint No chief complaint on file.   HPI Reginald [name] is a 50 y.o. male with history of chronic pedal edema comes to urgent care with redness of the right leg of a couple of days duration.  Symptoms started insidiously and has worsened over the past couple days.  He denies any fever or chills.  He has pain in the right leg.  He has multiple areas of excoriation of the right leg.   HPI  Past Medical History:  Diagnosis Date   Anxiety    Aortic stenosis    Arthritis    Asthma    Back pain    Cellulitis of skin with lymphangitis    CHF (congestive heart failure) (HCC)    Chronic diastolic congestive heart failure (HCC)    Chronic venous insufficiency    Constipation    COPD (chronic obstructive pulmonary disease) (HCC)    Depression    Diabetes ([place])    Dyspnea    Essential hypertension    Gout    Heart murmur    Hypertension    Morbid obesity ([place])    Obesity    Pneumonia    walking pneumonia   Prostatitis    Pulmonary embolism ([place])    Pulmonary hypertension (HCC)    Pyelonephritis    S/P aortic valve replacement with bioprosthetic valve 08/23/2017   25 mm Edwards Inspiris Resilia stented bovine pericardial tissue valve   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   24 mm Hemashield supracoronary straight graft    Sleep apnea    cpap    Thoracic ascending aortic aneurysm [facility])    Thoracic ascending aortic aneurysm (HCC)    Tobacco abuse     Patient Active Problem List   Diagnosis Date Noted   Rectal bleeding    Chronic blood loss anemia 11/12/2020   Gastrointestinal hemorrhage 10/08/2020   S/P placement of cardiac pacemaker 02/10/2020   Encounter for examination following treatment at hospital 02/10/2020   Anxiety 02/10/2020   DM2 (diabetes mellitus, type 2) ([place]) 02/10/2020   OSA on CPAP    Abnormal liver function 03/16/2019   History of  pulmonary embolism 03/16/2019   Paroxysmal atrial fibrillation ([place]) 09/05/2018   COPD (chronic obstructive pulmonary disease) ([place]) 05/07/2018   Gastroesophageal reflux disease    Pulmonary embolism ([place]) 09/18/2017   Encounter for therapeutic drug monitoring 09/04/2017   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 08/23/2017   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   Pulmonary hypertension ([place])    Chronic periodontitis 07/18/2017   Morbid obesity ([place])    Essential hypertension    Tobacco abuse    Chronic heart failure with preserved ejection fraction (HFpEF) ([place])    Chronic venous insufficiency     Past Surgical History:  Procedure Laterality Date   AORTIC VALVE REPLACEMENT N/A 08/23/2017   Procedure: AORTIC VALVE REPLACEMENT (AVR) USING INSPIRIS RESILIA AORTIC VALVE SIZE 25 MM;  Surgeon: Rexene Alberts, MD;  Location: [place];  Service: Open Heart Surgery;  Laterality: N/A;   CARDIAC VALVE REPLACEMENT N/A    [employer] 02/07/2020   COLONOSCOPY WITH PROPOFOL N/A 11/14/2020   Procedure: COLONOSCOPY WITH PROPOFOL;  Surgeon: Gatha Mayer, MD;  Location: [facility] ENDOSCOPY;  Service: Endoscopy;  Laterality: N/A;   ESOPHAGOGASTRODUODENOSCOPY (EGD) WITH PROPOFOL N/A 11/14/2020   Procedure: ESOPHAGOGASTRODUODENOSCOPY (  EGD) WITH PROPOFOL;  Surgeon: Gatha Mayer, MD;  Location: [facility] ENDOSCOPY;  Service: Endoscopy;  Laterality: N/A;   MULTIPLE EXTRACTIONS WITH ALVEOLOPLASTY N/A 07/23/2017   Procedure: Extraction of tooth #10 with alveoloplasty and gross debridement of remaining teeth;  Surgeon: Lenn Cal, DDS;  Location: [place];  Service: Oral Surgery;  Laterality: N/A;   PACEMAKER IMPLANT N/A 08/28/2017    [employer] Jude [employer] Assurity MRI conditional  dual-chamber pacemaker for symptomatic complete heart blockby Dr Rayann Heman   TEE WITHOUT CARDIOVERSION N/A 07/16/2017   Procedure: TRANSESOPHAGEAL ECHOCARDIOGRAM (TEE);  Surgeon:  Lelon Perla, MD;  Location[facility] ENDOSCOPY;  Service: Cardiovascular;  Laterality: N/A;   TEE WITHOUT CARDIOVERSION N/A 08/23/2017   Procedure: TRANSESOPHAGEAL ECHOCARDIOGRAM (TEE);  Surgeon: Rexene Alberts, MD;  Location: [place];  Service: Open Heart Surgery;  Laterality: N/A;   THORACIC AORTIC ANEURYSM REPAIR N/A 08/23/2017   Procedure: THORACIC ASCENDING ANEURYSM REPAIR (AAA) USING HEMASHIELD GOLD KNITTED MICROVEL DOUBLE VELOUR VASCULAR GRAFT D: 24 MM  L: 30 CM;  Surgeon: Rexene Alberts, MD;  Location: [place];  Service: Open Heart Surgery;  Laterality: N/A;       Home Medications    Prior to Admission medications   Medication Sig Start Date End Date Taking? Authorizing Provider  cephALEXin (KEFLEX) 500 MG capsule Take 2 capsules (1,000 mg total) by mouth 2 (two) times daily for 5 days. 02/18/21 02/23/21 Yes Meleny Tregoning, Myrene Galas, MD  lidocaine-prilocaine (EMLA) cream Apply 1 application topically as needed. 02/18/21  Yes Aalayah Riles, Myrene Galas, MD  acetaminophen (TYLENOL) 325 MG tablet Take 650 mg by mouth every 6 (six) hours as needed for mild pain.    [provider]  albuterol (PROVENTIL) (2.5 MG/3ML) 0.083% nebulizer solution INHALE 1 VIAL VIA NEBULIZER EVERY 6 HOURS AS NEEDED FOR WHEEZING OR SHORTNESS OF BREATH 02/01/21   [name], MD  albuterol (VENTOLIN HFA) 108 (90 Base) MCG/ACT inhaler INHALE 2 PUFFS INTO THE LUNGS EVERY 6 (SIX) HOURS AS NEEDED FOR WHEEZING OR SHORTNESS OF BREATH. 12/10/20   [name], MD  allopurinol (ZYLOPRIM) 300 MG tablet TAKE 1 TABLET EVERY DAY 11/11/20   [name], MD  ammonium lactate (AMLACTIN) 12 % lotion Apply 1 application topically as needed for dry skin. 10/13/20   Felipa Furnace, DPM  atorvastatin (LIPITOR) 40 MG tablet TAKE 1 TABLET EVERY DAY 02/14/21   [name], MD  Blood Glucose Monitoring Suppl (TRUE METRIX METER) w/Device KIT USE AS DIRECTED 08/19/20   [name], MD  buPROPion (WELLBUTRIN XL) 300 MG 24 hr tablet Take  1 tablet (300 mg total) by mouth daily. 02/17/21   [name], MD  diclofenac Sodium (VOLTAREN) 1 % GEL Apply 2 g topically 4 (four) times daily. 10/26/20   Mordecai Rasmussen, MD  docusate sodium (COLACE) 100 MG capsule Take 2 capsules (200 mg total) by mouth 2 (two) times daily. 11/15/20   Thurnell Lose, MD  empagliflozin (JARDIANCE) 10 MG TABS tablet Take 1 tablet (10 mg total) by mouth daily before breakfast. 01/12/21   Ahmed Prima, Fransisco Hertz, PA-C  FEROSUL 325 (65 Fe) MG tablet TAKE 1 TABLET EVERY DAY 12/23/20   [name], MD  fexofenadine (ALLEGRA) 180 MG tablet Take 180 mg by mouth daily.    [provider]  gabapentin (NEURONTIN) 100 MG capsule Take 1 capsule in the morning and 2 capsules in the evening. 11/15/20   Thurnell Lose, MD  hydrOXYzine (ATARAX/VISTARIL)  25 MG tablet Take 1 tablet (25 mg total) by mouth every 8 (eight) hours as needed. 02/03/21   [name], MD  Krill Oil 350 MG CAPS Take 1 capsule by mouth in the morning and at bedtime.     [provider]  lisinopril (ZESTRIL) 20 MG tablet Take 1 tablet (20 mg total) by mouth daily. 02/09/21 05/10/21  Strader, Fransisco Hertz, PA-C  loratadine (CLARITIN) 10 MG tablet Take 10 mg by mouth daily as needed for allergies. Patient not taking: No sig reported    [provider]  metFORMIN (GLUCOPHAGE) 850 MG tablet Take 1 tablet (850 mg total) by mouth 2 (two) times daily with a meal. 10/08/20   Patel, Colin Broach, MD  metolazone (ZAROXOLYN) 5 MG tablet TAKE 1 TABLET (5 MG TOTAL) BY MOUTH THREE TIMES A WEEK. 01/26/21   Allred, Jeneen Rinks, MD  metoprolol tartrate (LOPRESSOR) 100 MG tablet TAKE 1 TABLET TWICE DAILY 12/27/20   Josue Hector, MD  pantoprazole (PROTONIX) 40 MG tablet Take 1 tablet (40 mg total) by mouth daily. 07/14/20   [name], MD  polyethylene glycol (MIRALAX) 17 g packet Take 17 g by mouth daily as needed. 11/15/20   Thurnell Lose, MD  potassium chloride SA (KLOR-CON) 20 MEQ tablet TAKE 3 TABS  DAILY AND TAKE AN ADDITIONAL 1 TAB ON THE DAY YOU TAKE YOUR WEEKLY METOLAZONE DOSE 12/22/20   Ahmed Prima, Fransisco Hertz, PA-C  rivaroxaban (XARELTO) 20 MG TABS tablet TAKE 1 Tablet BY MOUTH ONCE EVERY DAY WITH SUPPER 10/20/20   Josue Hector, MD  Semaglutide (RYBELSUS) 14 MG TABS Take 14 mg by mouth in the morning. 02/08/21   [name], MD  terbinafine (LAMISIL) 250 MG tablet Take 1 tablet by mouth once daily 01/25/21   [name], MD  Tiotropium Bromide Monohydrate (SPIRIVA RESPIMAT) 2.5 MCG/ACT AERS Inhale 2 puffs into the lungs daily. Patient not taking: No sig reported 02/03/21   [name], MD  torsemide (DEMADEX) 20 MG tablet Take 2 tablets (40 mg total) by mouth 2 (two) times daily. 02/04/21   Ahmed Prima, Fransisco Hertz, PA-C  TRUE METRIX BLOOD GLUCOSE TEST test strip TEST ONE TIME DAILY FOR DIABETES 10/25/20   [name], MD  TRUEplus Lancets 33G MISC USE TO TEST ONE TIME DAILY FOR DIABETES 10/25/20   [name], MD    Family History Family History  Problem Relation Age of Onset   Hypertension Mother    Alzheimer's disease Mother    Heart attack Mother    Heart attack Brother    Diabetes Brother     Social History Social History   Tobacco Use   Smoking status: Former    Packs/day: 2.00    Years: 16.00    Pack years: 32.00    Types: Cigarettes    Quit date: 08/23/2017    Years since quitting: 3.4   Smokeless tobacco: Never  Vaping Use   Vaping Use: Never used  Substance Use Topics   Alcohol use: No    Comment: have had alcohol in the past, not heavy   Drug use: No     Allergies   Patient has no known allergies.   Review of Systems Review of Systems  Musculoskeletal: Negative.   Skin:  Positive for color change and wound. Negative for pallor and rash.    Physical Exam Triage Vital Signs ED Triage Vitals  Enc Vitals Group     BP 02/18/21 1723 117/72  Pulse Rate 02/18/21 1723 78     Resp 02/18/21 1723 (!) 22     Temp 02/18/21 1723 98.9 F (37.2 C)      Temp Source 02/18/21 1723 Oral     SpO2 02/18/21 1723 94 %     Weight --      Height --      Head Circumference --      Peak Flow --      Pain Score 02/18/21 1730 7     Pain Loc --      Pain Edu? --      Excl. in [place]? --    No data found.  Updated Vital Signs BP 117/72 (BP Location: Right Wrist)   Pulse 78   Temp 98.9 F (37.2 C) (Oral)   Resp (!) 22   SpO2 94%   Visual Acuity Right Eye Distance:   Left Eye Distance:   Bilateral Distance:    Right Eye Near:   Left Eye Near:    Bilateral Near:     Physical Exam Vitals and nursing note reviewed.  Cardiovascular:     Rate and Rhythm: Normal rate and regular rhythm.  Musculoskeletal:        General: Swelling present. No tenderness or signs of injury. Normal range of motion.  Skin:    General: Skin is warm.     Capillary Refill: Capillary refill takes less than 2 seconds.     Findings: Erythema and lesion present.  Neurological:     Mental Status: He is alert.     UC Treatments / Results  Labs (all labs ordered are listed, but only abnormal results are displayed) Labs Reviewed - No data to display  EKG   Radiology No results found.  Procedures Procedures (including critical care time)  Medications Ordered in UC Medications - No data to display  Initial Impression / Assessment and Plan / UC Course  I have reviewed the triage vital signs and the nursing notes.  Pertinent labs & imaging results that were available during my care of the patient were reviewed by me and considered in my medical decision making (see chart for details).     1.  Cellulitis of the right leg: Keflex 1000 milligrams twice daily for 5 days Tylenol or Motrin as needed for pain If symptoms worsen please return to urgent care to be reevaluated. Final Clinical Impressions(s) / UC Diagnoses   Final diagnoses:  Cellulitis of leg, right     Discharge Instructions      Please use medications as prescribed If symptoms worsen  please return to the urgent care to be reevaluated.   ED Prescriptions     Medication Sig Dispense Auth. Provider   cephALEXin (KEFLEX) 500 MG capsule Take 2 capsules (1,000 mg total) by mouth 2 (two) times daily for 5 days. 20 capsule Shona Pardo, Myrene Galas, MD   lidocaine-prilocaine (EMLA) cream Apply 1 application topically as needed. 30 g Bryttani Blew, Myrene Galas, MD      PDMP not reviewed this encounter.   Chase Picket, MD 02/20/21 [phone]

## 2021-02-21 ENCOUNTER — Other Ambulatory Visit: Payer: Self-pay

## 2021-02-21 ENCOUNTER — Encounter (INDEPENDENT_AMBULATORY_CARE_PROVIDER_SITE_OTHER): Payer: Self-pay | Admitting: Gastroenterology

## 2021-02-21 ENCOUNTER — Ambulatory Visit (INDEPENDENT_AMBULATORY_CARE_PROVIDER_SITE_OTHER): Payer: Medicare HMO | Admitting: Gastroenterology

## 2021-02-21 DIAGNOSIS — D509 Iron deficiency anemia, unspecified: Secondary | ICD-10-CM | POA: Diagnosis not present

## 2021-02-21 DIAGNOSIS — K5900 Constipation, unspecified: Secondary | ICD-10-CM | POA: Insufficient documentation

## 2021-02-21 MED ORDER — PEG 3350-KCL-NA BICARB-NACL 420 G PO SOLR: 4000.0000 mL | Freq: Once | ORAL | 0 refills | Status: AC

## 2021-02-21 NOTE — Patient Instructions (Signed)
Recheck CBC and iron stores in 3 months Take bowel prep this weekend. The day after taking the bowel prep, start taking Miralax 1 capful every day for one week. If bowel movements do not improve, increase to 1 capful every 12 hours. If after two weeks there is no improvement, increase to 1 capful every 8 hours

## 2021-02-21 NOTE — Progress Notes (Signed)
Reginald [name], M.D. Gastroenterology & Hepatology [facility] [address] [place], [place] [address] Primary Care Physician: [name], MD [address] [place] [address]  Referring MD: PCP  Chief Complaint: Melena episodes  History of Present Illness: Reginald [name] is a 50 y.o. male with PMH severe AS (s/p bovine tissue AVR in 08/2017 with repair of ascending thoracic aortic aneurysm),HFpEF, paroxysmal atrial fibrillation, CHB (s/p St. Jude PPM placement in 08/2017), history of bilateral PE, HTN, morbid obesity and OSA who presents for evaluation of melena.  Patient reports that for several months he has presented intermittent episodes of tarry bowel movements, which has been intermittent in nature. He denies having any fresh blood in his stool but states that sometimes his stools are tarry in nature. Last episodes of melena was a couple of weeks ago. He has presented episodes of abdominal pain in his nature for the last year or so. He states the pain is present all the time but worsens intermittently and is mostly located in his upper abdomen. He also reports that for the last 6 months he has presented occasionally episodes of constipation described as significant straining to move his bowels. Usually this is followed by large amount of loose bowel movement.  He takes a stool softener on a daily basis once a day.  Due to these episodes of melena, the patient decided to come to the ER at [facility] on June 2022 but given his BMI, he was considered high risk for any endoscopic procedures from the anesthesia perspective he was transferred to [facility].  [facility] he was found to have a hemoglobin of 10.2 with an MCV of 82.  Underwent a CT of the abdomen and pelvis with IV contrast that showed presence of hepatomegaly with mildly distended gallbladder but no other hepatobiliary alterations, enlarged spleen up to 20 cm, no  other acute alterations were found.  Due to this, he underwent an EGD and a colonoscopy on 11/14/2020.  EGD showed normal esophagus, stomach and duodenum.  Colonoscopy was only positive for external and internal hemorrhoids.  He was discharged home and was set up to follow-up with his PCP who referred him to our office.  He has been taking iron twice a day. He is currently on Xarelto  The patient denies having any nausea, vomiting, fever, chills, hematochezia,  hematemesis, diarrhea, jaundice, pruritus or weight loss.  Last [id number]  - performed by Dr. Carlean Purl - Normal esophagus. - Normal stomach. - Normal examined duodenum. Last Colonoscopy: 2022 - performed by Dr. Carlean Purl - External and internal hemorrhoids. - The examination was otherwise normal on direct and retroflexion views. - No specimens collected.  FHx: neg for any gastrointestinal/liver disease, no malignancies Social: quit smoking in 08/2017, neg alcohol or illicit drug use Surgical: no abdominal surgeries  Past Medical History: Past Medical History:  Diagnosis Date   Anxiety    Aortic stenosis    Arthritis    Asthma    Back pain    Cellulitis of skin with lymphangitis    CHF (congestive heart failure) (HCC)    Chronic diastolic congestive heart failure (HCC)    Chronic venous insufficiency    Constipation    COPD (chronic obstructive pulmonary disease) (HCC)    Depression    Diabetes ([place])    Dyspnea    Essential hypertension    Gout    Heart murmur    Hypertension    Morbid  obesity ([place])    Obesity    Pneumonia    walking pneumonia   Prostatitis    Pulmonary embolism ([place])    Pulmonary hypertension (HCC)    Pyelonephritis    S/P aortic valve replacement with bioprosthetic valve 08/23/2017   25 mm Edwards Inspiris Resilia stented bovine pericardial tissue valve   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   24 mm Hemashield supracoronary straight graft    Sleep apnea    cpap    Thoracic ascending aortic  aneurysm    Thoracic ascending aortic aneurysm    Tobacco abuse     Past Surgical History: Past Surgical History:  Procedure Laterality Date   AORTIC VALVE REPLACEMENT N/A 08/23/2017   Procedure: AORTIC VALVE REPLACEMENT (AVR) USING INSPIRIS RESILIA AORTIC VALVE SIZE 25 MM;  Surgeon: Rexene Alberts, MD;  Location: [place];  Service: Open Heart Surgery;  Laterality: N/A;   CARDIAC VALVE REPLACEMENT N/A    Phreesia 02/07/2020   COLONOSCOPY WITH PROPOFOL N/A 11/14/2020   Procedure: COLONOSCOPY WITH PROPOFOL;  Surgeon: Gatha Mayer, MD;  Location: [facility] ENDOSCOPY;  Service: Endoscopy;  Laterality: N/A;   ESOPHAGOGASTRODUODENOSCOPY (EGD) WITH PROPOFOL N/A 11/14/2020   Procedure: ESOPHAGOGASTRODUODENOSCOPY (EGD) WITH PROPOFOL;  Surgeon: Gatha Mayer, MD;  Location: [place];  Service: Endoscopy;  Laterality: N/A;   MULTIPLE EXTRACTIONS WITH ALVEOLOPLASTY N/A 07/23/2017   Procedure: Extraction of tooth #10 with alveoloplasty and gross debridement of remaining teeth;  Surgeon: Lenn Cal, DDS;  Location: [place];  Service: Oral Surgery;  Laterality: N/A;   PACEMAKER IMPLANT N/A 08/28/2017    St Jude Medical Assurity MRI conditional  dual-chamber pacemaker for symptomatic complete heart blockby Dr Rayann Heman   TEE WITHOUT CARDIOVERSION N/A 07/16/2017   Procedure: TRANSESOPHAGEAL ECHOCARDIOGRAM (TEE);  Surgeon: Lelon Perla, MD;  Location: [facility] ENDOSCOPY;  Service: Cardiovascular;  Laterality: N/A;   TEE WITHOUT CARDIOVERSION N/A 08/23/2017   Procedure: TRANSESOPHAGEAL ECHOCARDIOGRAM (TEE);  Surgeon: Rexene Alberts, MD;  Location: [place];  Service: Open Heart Surgery;  Laterality: N/A;   THORACIC AORTIC ANEURYSM REPAIR N/A 08/23/2017   Procedure: THORACIC ASCENDING ANEURYSM REPAIR (AAA) USING HEMASHIELD GOLD KNITTED MICROVEL DOUBLE VELOUR VASCULAR GRAFT D: 24 MM  L: 30 CM;  Surgeon: Rexene Alberts, MD;  Location: [place];  Service: Open Heart Surgery;  Laterality: N/A;    Family  History: Family History  Problem Relation Age of Onset   Hypertension Mother    Alzheimer's disease Mother    Heart attack Mother    Heart attack Brother    Diabetes Brother     Social History: Social History   Tobacco Use  Smoking Status Former   Packs/day: 2.00   Years: 16.00   Pack years: 32.00   Types: Cigarettes   Quit date: 08/23/2017   Years since quitting: 3.5  Smokeless Tobacco Never   Social History   Substance and Sexual Activity  Alcohol Use No   Comment: have had alcohol in the past, not heavy   Social History   Substance and Sexual Activity  Drug Use No    Allergies: No Known Allergies  Medications: Current Outpatient Medications  Medication Sig Dispense Refill   acetaminophen (TYLENOL) 325 MG tablet Take 650 mg by mouth every 6 (six) hours as needed for mild pain.     albuterol (PROVENTIL) (2.5 MG/3ML) 0.083% nebulizer solution INHALE 1 VIAL VIA NEBULIZER EVERY 6 HOURS AS NEEDED FOR WHEEZING OR SHORTNESS OF BREATH 270 mL 1   albuterol (  VENTOLIN HFA) 108 (90 Base) MCG/ACT inhaler INHALE 2 PUFFS INTO THE LUNGS EVERY 6 (SIX) HOURS AS NEEDED FOR WHEEZING OR SHORTNESS OF BREATH. 1 each 1   allopurinol (ZYLOPRIM) 300 MG tablet TAKE 1 TABLET EVERY DAY 90 tablet 0   ammonium lactate (AMLACTIN) 12 % lotion Apply 1 application topically as needed for dry skin. 400 g 0   atorvastatin (LIPITOR) 40 MG tablet TAKE 1 TABLET EVERY DAY 90 tablet 3   Blood Glucose Monitoring Suppl (TRUE METRIX METER) w/Device KIT USE AS DIRECTED 1 kit 0   buPROPion (WELLBUTRIN XL) 300 MG 24 hr tablet Take 1 tablet (300 mg total) by mouth daily. 90 tablet 3   cephALEXin (KEFLEX) 500 MG capsule Take 2 capsules (1,000 mg total) by mouth 2 (two) times daily for 5 days. 20 capsule 0   docusate sodium (COLACE) 100 MG capsule Take 2 capsules (200 mg total) by mouth 2 (two) times daily. 30 capsule 0   empagliflozin (JARDIANCE) 10 MG TABS tablet Take 1 tablet (10 mg total) by mouth daily before  breakfast. 30 tablet 11   FEROSUL 325 (65 Fe) MG tablet TAKE 1 TABLET EVERY DAY 90 tablet 1   fexofenadine (ALLEGRA) 180 MG tablet Take 180 mg by mouth daily.     gabapentin (NEURONTIN) 100 MG capsule Take 1 capsule in the morning and 2 capsules in the evening. 90 capsule 0   hydrOXYzine (ATARAX/VISTARIL) 25 MG tablet Take 1 tablet (25 mg total) by mouth every 8 (eight) hours as needed. 90 tablet 2   Krill Oil 350 MG CAPS Take 1 capsule by mouth in the morning and at bedtime.      lidocaine-prilocaine (EMLA) cream Apply 1 application topically as needed. 30 g 0   lisinopril (ZESTRIL) 20 MG tablet Take 1 tablet (20 mg total) by mouth daily. 90 tablet 3   metFORMIN (GLUCOPHAGE) 850 MG tablet Take 1 tablet (850 mg total) by mouth 2 (two) times daily with a meal. 180 tablet 1   metolazone (ZAROXOLYN) 5 MG tablet TAKE 1 TABLET (5 MG TOTAL) BY MOUTH THREE TIMES A WEEK. 36 tablet 3   metoprolol tartrate (LOPRESSOR) 100 MG tablet TAKE 1 TABLET TWICE DAILY 180 tablet 3   pantoprazole (PROTONIX) 40 MG tablet Take 1 tablet (40 mg total) by mouth daily. 90 tablet 3   polyethylene glycol (MIRALAX) 17 g packet Take 17 g by mouth daily as needed. 14 each 0   potassium chloride SA (KLOR-CON) 20 MEQ tablet TAKE 3 TABS DAILY AND TAKE AN ADDITIONAL 1 TAB ON THE DAY YOU TAKE YOUR WEEKLY METOLAZONE DOSE 283 tablet 3   rivaroxaban (XARELTO) 20 MG TABS tablet TAKE 1 Tablet BY MOUTH ONCE EVERY DAY WITH SUPPER 90 tablet 3   Semaglutide (RYBELSUS) 14 MG TABS Take 14 mg by mouth in the morning. 90 tablet 0   terbinafine (LAMISIL) 250 MG tablet Take 1 tablet by mouth once daily 28 tablet 0   torsemide (DEMADEX) 20 MG tablet Take 2 tablets (40 mg total) by mouth 2 (two) times daily. 360 tablet 3   TRUE METRIX BLOOD GLUCOSE TEST test strip TEST ONE TIME DAILY FOR DIABETES 100 strip 5   TRUEplus Lancets 33G MISC USE TO TEST ONE TIME DAILY FOR DIABETES 100 each 5   diclofenac Sodium (VOLTAREN) 1 % GEL Apply 2 g topically 4  (four) times daily. (Patient not taking: Reported on 02/21/2021) 150 g 1   Tiotropium Bromide Monohydrate (SPIRIVA RESPIMAT) 2.5 MCG/ACT AERS  Inhale 2 puffs into the lungs daily. (Patient not taking: Reported on 02/21/2021) 4 g 5   No current facility-administered medications for this visit.    Review of Systems: GENERAL: negative for malaise, night sweats HEENT: No changes in hearing or vision, no nose bleeds or other nasal problems. NECK: Negative for lumps, goiter, pain and significant neck swelling RESPIRATORY: Negative for cough, wheezing CARDIOVASCULAR: Negative for chest pain, leg swelling, palpitations, orthopnea GI: SEE HPI MUSCULOSKELETAL: Negative for joint pain or swelling, back pain, and muscle pain. SKIN: Negative for lesions, rash PSYCH: Negative for sleep disturbance, mood disorder and recent psychosocial stressors. HEMATOLOGY Negative for prolonged bleeding, bruising easily, and swollen nodes. ENDOCRINE: Negative for cold or heat intolerance, polyuria, polydipsia and goiter. NEURO: negative for tremor, gait imbalance, syncope and seizures. The remainder of the review of systems is noncontributory.   Physical Exam: BP 139/81 (BP Location: Left Arm, Patient Position: Sitting, Cuff Size: Large)   Pulse 79   Temp 98.1 F (36.7 C) (Oral)   Ht _0  (1.803 m)   Wt (!) 486 lb (220.4 kg)   BMI 67.78 kg/m  GENERAL: The patient is AO x3, in no acute distress. Morbid obesity. Sitting in chair, mobility limited due to weight. HEENT: Head is normocephalic and atraumatic. EOMI are intact. Mouth is well hydrated and without lesions. NECK: Supple. No masses LUNGS: Clear to auscultation. No presence of rhonchi/wheezing/rales. Adequate chest expansion HEART: RRR, normal s1 and s2. ABDOMEN: Soft, nontender, no guarding, no peritoneal signs, and nondistended. BS +. No masses. EXTREMITIES: Without any cyanosis, clubbing, rash, lesions or edema. NEUROLOGIC: AOx3, no focal motor  deficit. SKIN: no jaundice, no rashes   Imaging/Labs: as above  I personally reviewed and interpreted the available labs, imaging and endoscopic files.  Impression and Plan: Reginald [name] is a 50 y.o. male with PMH severe AS (s/p bovine tissue AVR in 08/2017 with repair of ascending thoracic aortic aneurysm),HFpEF, paroxysmal atrial fibrillation, CHB (s/p St. Jude PPM placement in 08/2017), history of bilateral PE, HTN, morbid obesity and OSA who presents for evaluation of melena.  Patient has presented some episodes of questionable melena without any clear source upon upper and lower endoscopic investigations.  Imaging studies have been unrevealing for a source of his melena.  As he has not presented any recent episode, we will hold on any investigations for now but he will will need to have a repeat CBC and iron stores checked in 3 months.  I advised him to continue taking his oral iron supplementation.  I discussed the possibility of performing a capsule endoscopy if his iron stores remain depleted.  If he has any lesions in his small bowel, given his BMI, we will need to refer him to a tertiary center for further management.  For now, I advised him to start taking a bowel prep followed by MiraLAX on a daily basis as he seems to have episodes of overflow diarrhea.  The patient understood and agreed.  -Recheck CBC and iron stores in 3 months -Take bowel prep this weekend. -The day after taking the bowel prep, start taking Miralax 1 capful every day for one week. If bowel movements do not improve, increase to 1 capful every 12 hours. If after two weeks there is no improvement, increase to 1 capful every 8 hours  All questions were answered.      Reginald Peppers, MD Gastroenterology and Hepatology [facility]

## 2021-02-23 ENCOUNTER — Ambulatory Visit (INDEPENDENT_AMBULATORY_CARE_PROVIDER_SITE_OTHER): Payer: Medicare HMO

## 2021-02-23 DIAGNOSIS — I442 Atrioventricular block, complete: Secondary | ICD-10-CM | POA: Diagnosis not present

## 2021-02-23 LAB — CUP PACEART REMOTE DEVICE CHECK
Battery Remaining Longevity: 92 mo
Battery Remaining Percentage: 74 %
Battery Voltage: 3.01 V
Brady Statistic AP VP Percent: 1 %
Brady Statistic AP VS Percent: 1 %
Brady Statistic AS VP Percent: 1 %
Brady Statistic AS VS Percent: 99 %
Brady Statistic RA Percent Paced: 1 %
Brady Statistic RV Percent Paced: 1 %
Date Time Interrogation Session: 20221005020018
Implantable Lead Implant Date: 20190409
Implantable Lead Implant Date: 20190409
Implantable Lead Location: 753859
Implantable Lead Location: 753860
Implantable Pulse Generator Implant Date: 20190409
Lead Channel Impedance Value: 510 Ohm
Lead Channel Impedance Value: 590 Ohm
Lead Channel Pacing Threshold Amplitude: 0.5 V
Lead Channel Pacing Threshold Amplitude: 0.75 V
Lead Channel Pacing Threshold Pulse Width: 0.5 ms
Lead Channel Pacing Threshold Pulse Width: 0.5 ms
Lead Channel Sensing Intrinsic Amplitude: 4.5 mV
Lead Channel Sensing Intrinsic Amplitude: 5 mV
Lead Channel Setting Pacing Amplitude: 2 V
Lead Channel Setting Pacing Amplitude: 2.5 V
Lead Channel Setting Pacing Pulse Width: 0.5 ms
Lead Channel Setting Sensing Sensitivity: 1.5 mV
Pulse Gen Model: 2272
Pulse Gen Serial Number: 9010865

## 2021-03-03 ENCOUNTER — Other Ambulatory Visit: Payer: Self-pay | Admitting: *Deleted

## 2021-03-03 ENCOUNTER — Ambulatory Visit: Payer: Medicare HMO | Admitting: *Deleted

## 2021-03-03 ENCOUNTER — Ambulatory Visit (INDEPENDENT_AMBULATORY_CARE_PROVIDER_SITE_OTHER): Payer: Medicare HMO | Admitting: Pharmacist

## 2021-03-03 DIAGNOSIS — F419 Anxiety disorder, unspecified: Secondary | ICD-10-CM

## 2021-03-03 DIAGNOSIS — I5032 Chronic diastolic (congestive) heart failure: Secondary | ICD-10-CM

## 2021-03-03 DIAGNOSIS — I480 Paroxysmal atrial fibrillation: Secondary | ICD-10-CM

## 2021-03-03 DIAGNOSIS — J449 Chronic obstructive pulmonary disease, unspecified: Secondary | ICD-10-CM

## 2021-03-03 DIAGNOSIS — K219 Gastro-esophageal reflux disease without esophagitis: Secondary | ICD-10-CM

## 2021-03-03 DIAGNOSIS — F339 Major depressive disorder, recurrent, unspecified: Secondary | ICD-10-CM

## 2021-03-03 DIAGNOSIS — I10 Essential (primary) hypertension: Secondary | ICD-10-CM

## 2021-03-03 DIAGNOSIS — E1169 Type 2 diabetes mellitus with other specified complication: Secondary | ICD-10-CM

## 2021-03-03 NOTE — Patient Instructions (Signed)
Reginald [name],  It was great to talk to you today!  Please call me with any questions or concerns.   Visit Information  PATIENT GOALS:  Goals Addressed             This Visit's Progress    Medication Management       Patient Goals/Self-Care Activities Over the next 90 days, patient will:  take medications as prescribed check glucose twice daily (fasting and bedtime), document, and provide at future appointments check blood pressure at least once daily, document, and provide at future appointments weigh daily, and contact provider if weight gain of 3 lbs in a day or more than 5 lbs in a week                  Patient verbalizes understanding of instructions provided today and agrees to view in [facility].   Face to Face appointment with care management team member scheduled for: 03/29/21  Kennon Holter, PharmD Clinical Pharmacist [facility] [phone]

## 2021-03-03 NOTE — Patient Instructions (Signed)
Visit Information  PATIENT GOALS:  Goals Addressed             This Visit's Progress    Track and Manage Fluids and Swelling-Heart Failure       Timeframe:  Long-Range Goal Priority:  High Start Date:               12/07/2020              Expected End Date:     06/09/2021                  Follow Up Date - 04/05/2021   - Important- call cardiologist office if I gain more than 3 pounds in one day or 5 pounds in one week- follow heart failure action plan, know how you are feeling each day with emphasis on yellow zone - do ankle pumps when sitting - elevate legs when you can - continue to weigh daily and write weight in diary- weigh same time each day on flat, hard surface - follow low salt diet- read labels - watch for swelling in feet, ankles and legs every day - take all medications as prescribed - keep in touch with your doctor about weight gain, swelling   Why is this important?   It is important to check your weight daily and watch how much salt and liquids you have.  It will help you to manage your heart failure.    Notes:      Track and Manage My Symptoms-COPD       Timeframe:  Long-Range Goal Priority:  High Start Date:      12/07/2020                       Expected End Date:     06/09/2021                  Follow Up Date - 04/05/2021   - Continue to follow COPD rescue plan, be mindful of how you feel each day with emphasis on yellow zone - continue follow rescue plan if symptoms flare-up, call doctor early on if not feeling well - keep follow-up appointments - see cardiologist 11/29 primary care doctor 12/23 - continue to use CPAP as prescribed - take all medications and use inhalers as prescribed, talk to pharmacist assigned to your case if any questions or concerns - please continue to work with Education officer, museum for management of depression/ anxiety - practice energy conservation, alternate activity with rest    Why is this important?   Tracking your symptoms and  other information about your health helps your doctor plan your care.  Write down the symptoms, the time of day, what you were doing and what medicine you are taking.  You will soon learn how to manage your symptoms.     Notes:         Patient verbalizes understanding of instructions provided today and agrees to view in [place].   Telephone follow up appointment with care management team member scheduled for:  04/05/2021  Jacqlyn Larsen [facility], BSN RN Case Manager [facility] [phone]

## 2021-03-03 NOTE — Chronic Care Management (AMB) (Signed)
Chronic Care Management   CCM RN Visit Note  03/03/2021 Name: Reginald [name] MRN: [id number] DOB: 02/12/1971  Subjective: Reginald [name] is a 50 y.o. year old male who is a primary care patient of [name], MD. The care management team was consulted for assistance with disease management and care coordination needs.    Engaged with patient by telephone for follow up visit in response to provider referral for case management and/or care coordination services.   Consent to Services:  The patient was given information about Chronic Care Management services, agreed to services, and gave verbal consent prior to initiation of services.  Please see initial visit note for detailed documentation.   Patient agreed to services and verbal consent obtained.   Assessment: Review of patient past medical history, allergies, medications, health status, including review of consultants reports, laboratory and other test data, was performed as part of comprehensive evaluation and provision of chronic care management services.   SDOH (Social Determinants of Health) assessments and interventions performed:    CCM Care Plan  No Known Allergies  Outpatient Encounter Medications as of 03/03/2021  Medication Sig Note   acetaminophen (TYLENOL) 325 MG tablet Take 650 mg by mouth every 6 (six) hours as needed for mild pain.    albuterol (PROVENTIL) (2.5 MG/3ML) 0.083% nebulizer solution INHALE 1 VIAL VIA NEBULIZER EVERY 6 HOURS AS NEEDED FOR WHEEZING OR SHORTNESS OF BREATH    albuterol (VENTOLIN HFA) 108 (90 Base) MCG/ACT inhaler INHALE 2 PUFFS INTO THE LUNGS EVERY 6 (SIX) HOURS AS NEEDED FOR WHEEZING OR SHORTNESS OF BREATH.    allopurinol (ZYLOPRIM) 300 MG tablet TAKE 1 TABLET EVERY DAY    ammonium lactate (AMLACTIN) 12 % lotion Apply 1 application topically as needed for dry skin.    atorvastatin (LIPITOR) 40 MG tablet TAKE 1 TABLET EVERY DAY    Blood Glucose Monitoring Suppl (TRUE METRIX METER) w/Device  KIT USE AS DIRECTED    buPROPion (WELLBUTRIN XL) 300 MG 24 hr tablet Take 1 tablet (300 mg total) by mouth daily.    docusate sodium (COLACE) 100 MG capsule Take 2 capsules (200 mg total) by mouth 2 (two) times daily. 02/21/2021: 2 once a day   empagliflozin (JARDIANCE) 10 MG TABS tablet Take 1 tablet (10 mg total) by mouth daily before breakfast. 03/03/2021: Gets from patient assistance ([employer])   FEROSUL 325 (65 Fe) MG tablet TAKE 1 TABLET EVERY DAY    fexofenadine (ALLEGRA) 180 MG tablet Take 180 mg by mouth daily.    gabapentin (NEURONTIN) 100 MG capsule Take 1 capsule in the morning and 2 capsules in the evening.    hydrOXYzine (ATARAX/VISTARIL) 25 MG tablet Take 1 tablet (25 mg total) by mouth every 8 (eight) hours as needed.    Krill Oil 350 MG CAPS Take 1 capsule by mouth in the morning and at bedtime.     lidocaine-prilocaine (EMLA) cream Apply 1 application topically as needed.    lisinopril (ZESTRIL) 20 MG tablet Take 1 tablet (20 mg total) by mouth daily.    metFORMIN (GLUCOPHAGE) 850 MG tablet Take 1 tablet (850 mg total) by mouth 2 (two) times daily with a meal.    metolazone (ZAROXOLYN) 5 MG tablet TAKE 1 TABLET (5 MG TOTAL) BY MOUTH THREE TIMES A WEEK. 03/03/2021: Has only been using ~1 time per week recently to maintain fluid   metoprolol tartrate (LOPRESSOR) 100 MG tablet TAKE 1 TABLET TWICE DAILY    pantoprazole (PROTONIX) 40 MG tablet  Take 1 tablet (40 mg total) by mouth daily.    polyethylene glycol (MIRALAX) 17 g packet Take 17 g by mouth daily as needed.    potassium chloride SA (KLOR-CON) 20 MEQ tablet TAKE 3 TABS DAILY AND TAKE AN ADDITIONAL 1 TAB ON THE DAY YOU TAKE YOUR WEEKLY METOLAZONE DOSE    rivaroxaban (XARELTO) 20 MG TABS tablet TAKE 1 Tablet BY MOUTH ONCE EVERY DAY WITH SUPPER    Semaglutide (RYBELSUS) 14 MG TABS Take 14 mg by mouth in the morning. 03/03/2021: Gets from patient assistance ([employer])   terbinafine (LAMISIL) 250 MG tablet Take 1 tablet by mouth  once daily    Tiotropium Bromide Monohydrate (SPIRIVA RESPIMAT) 2.5 MCG/ACT AERS Inhale 2 puffs into the lungs daily. 03/03/2021: Gets from patient assistance ([employer])   torsemide (DEMADEX) 20 MG tablet Take 2 tablets (40 mg total) by mouth 2 (two) times daily.    TRUE METRIX BLOOD GLUCOSE TEST test strip TEST ONE TIME DAILY FOR DIABETES    TRUEplus Lancets 33G MISC USE TO TEST ONE TIME DAILY FOR DIABETES    diclofenac Sodium (VOLTAREN) 1 % GEL Apply 2 g topically 4 (four) times daily. (Patient not taking: No sig reported)    No facility-administered encounter medications on file as of 03/03/2021.    Patient Active Problem List   Diagnosis Date Noted   Iron deficiency anemia 02/21/2021   Constipation 02/21/2021   Rectal bleeding    Chronic blood loss anemia 11/12/2020   Gastrointestinal hemorrhage 10/08/2020   S/P placement of cardiac pacemaker 02/10/2020   Encounter for examination following treatment at hospital 02/10/2020   Anxiety 02/10/2020   DM2 (diabetes mellitus, type 2) ([place]) 02/10/2020   OSA on CPAP    Abnormal liver function 03/16/2019   History of pulmonary embolism 03/16/2019   Paroxysmal atrial fibrillation ([place]) 09/05/2018   COPD (chronic obstructive pulmonary disease) ([place]) 05/07/2018   Gastroesophageal reflux disease    Pulmonary embolism ([place]) 09/18/2017   Encounter for therapeutic drug monitoring 09/04/2017   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 08/23/2017   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   Pulmonary hypertension ([place])    Chronic periodontitis 07/18/2017   Morbid obesity ([place])    Essential hypertension    Tobacco abuse    Chronic heart failure with preserved ejection fraction (HFpEF) ([place])    Chronic venous insufficiency     Conditions to be addressed/monitored:CHF and COPD  Care Plan : Heart Failure (Adult)  Updates made by Kassie Mends, RN since 03/03/2021 12:00 AM      Problem: Symptom Exacerbation (Heart Failure)   Priority: High     Long-Range Goal: Symptom Exacerbation Prevented or Minimized   Start Date: 12/07/2020  Expected End Date: 06/09/2021  This Visit's Progress: On track  Recent Progress: Not on track  Priority: High  Note:   Current Barriers:  Knowledge deficit related to basic heart failure pathophysiology and self care management- needs reinforcement for HF action plan, symptom management Does not adhere to provider recommendations re: low sodium diet, sometimes eats fast food, does not exercise, feels he is not able, stays tired, pt reports he checks CBG once daily with most recent AIC 5.8.  Checks blood pressure on occasion, has had no further GI bleeding (pt states it was due to hemorrhoids) Patient does continue to weigh daily with weight today 481 pounds.  Patient reports he still has anxiety and is working with [employer], continues working with  pharmacist for any medication related needs.  Pt reports he has all medications and taking as prescribed Lacks social connections- pt lives alone, has a friend he can call if needed, does not communicate much with his family, gets out and drives to the grocery store and flea market to talk with friends, pt continues to work with LCSW for depression management. Case Manager Clinical Goal(s):  patient will weigh self daily and record patient will verbalize understanding of Heart Failure Action Plan and when to call doctor patient will take all Heart Failure mediations as prescribed patient will weigh daily and record (notifying MD of 3 lb weight gain over night or 5 lb in a week) Interventions:  Collaboration with [name], MD regarding development and update of comprehensive plan of care as evidenced by provider attestation and co-signature Inter-disciplinary care team collaboration (see longitudinal plan of care) Reinforced education on low sodium diet  Reviewed in depth Heart Failure Action  Plan with emphasis on yellow zone and importance of staying in touch with doctor Reinforced with patient to weigh each morning after emptying bladder Reinforced importance of daily weight and advised patient to weigh and record daily Reinforced role of diuretics in prevention of fluid overload and management of heart failure Reviewed importance of taking all medications, using inhalers as prescribed Reviewed medication list with patient Reinforced importance of continuing to work with LCSW related to depression Talk to your doctor about referral to outpatient counseling if you feel this would be helpful Reviewed with patient to stay active, try to connect with friends and be as involved with others, community as possible, get outside daily and encouraged to walk if able even if only for a few minutes Reviewed with patient importance of reporting any GI bleeding to doctor Reviewed all upcoming provider appointments- cardiologist 11/29,  primary care provider 12/23, annual wellness visit 10/24, 11/11 Dr. Elsworth Soho,  11/29 Bernerd Pho,  11/30 podiatrist Self-Care Activities: Attends all scheduled provider appointments Attends church or other social activities Calls provider office for new concerns or questions Patient Goals: - Important- call cardiologist office if I gain more than 3 pounds in one day or 5 pounds in one week- follow heart failure action plan, know how you are feeling each day with emphasis on yellow zone - do ankle pumps when sitting - elevate legs when you can - continue to weigh daily and write weight in diary- weigh same time each day on flat, hard surface - follow low salt diet- read labels - watch for swelling in feet, ankles and legs every day - take all medications as prescribed - keep in touch with your doctor about weight gain, swelling Follow Up Plan: Telephone follow up appointment with care management team member scheduled for:  04/05/2021    Care Plan : COPD  (Adult)  Updates made by Kassie Mends, RN since 03/03/2021 12:00 AM     Problem: Symptom Exacerbation (COPD)   Priority: High     Long-Range Goal: Symptom Exacerbation Prevented or Minimized   Start Date: 12/07/2020  Expected End Date: 06/09/2021  This Visit's Progress: On track  Recent Progress: On track  Priority: High  Note:   Current Barriers:  Knowledge deficits related to basic understanding of COPD disease process- needs reinforcement of COPD action plan, symptom management, pt uses CPAP, checks 02 sat as needed.  Patient reports he got diabetic shoes 12/30/20. Knowledge deficits related to basic COPD self care/management- does not have advanced directives, declined information Knowledge deficit  related to importance of energy conservation- pt gets tired easily, does not always sleep well, can benefit from light exercise, has worked with home health PT [place] - lives alone, has friend he can call on, has depression PHQ-9=14, is working with LCSW for resources/ counseling, pt reports he tried outpatient counseling in the past and "it did not work very well" Case Electrical engineer): patient will report using inhalers as prescribed including rinsing mouth after use patient will verbalize understanding of COPD action plan and when to seek appropriate levels of medical care patient will engage in lite exercise as tolerated to build/regain stamina and strength and reduce shortness of breath through activity tolerance patient will verbalize basic understanding of COPD disease process and self care activities  Interventions:  Collaboration with [name], MD regarding development and update of comprehensive plan of care as evidenced by provider attestation and co-signature Inter-disciplinary care team collaboration (see longitudinal plan of care) Reinforced with patient education heart healthy diet  (avoid/ limit trans/ saturated fats) and carbohydrate  modified diet Reviewed medications used for management of COPD including inhalers Reviewed with patient to self assesses COPD action plan zone and make appointment with provider if in the yellow zone for 48 hours without improvement. Reviewed with patient education about the role of exercise in the management of COPD- ask pt to continue to complete exercises prescribed by PT Reinforced with patient to engage in light exercise as tolerated 3-5 days a week and practice energy conservation Reviewed with pt to limit day time naps to help with night time sleep Encouraged pt to continue meeting with friends at flea market and connecting with others to help with depression Reviewed importance of working with LCSW for depression management Self-Care Activities:  Attends all scheduled provider appointments Attends church or other social activities Calls provider office for new concerns or questions Patient Goals: - Continue to follow COPD rescue plan, be mindful of how you feel each day with emphasis on yellow zone - continue follow rescue plan if symptoms flare-up, call doctor early on if not feeling well - keep follow-up appointments - see cardiologist 11/29 primary care doctor 12/23 - continue to use CPAP as prescribed - take all medications and use inhalers as prescribed, talk to pharmacist assigned to your case if any questions or concerns - please continue to work with Education officer, museum for management of depression/ anxiety - practice energy conservation, alternate activity with rest - develop a new routine to improve sleep - get at least 7 to 8 hours of sleep at night - get outdoors every day (weather permitting) - keep room cool and dark Follow Up Plan: Telephone follow up appointment with care management team member scheduled for:  04/05/2021      Plan:Telephone follow up appointment with care management team member scheduled for: 04/05/2021  Jacqlyn Larsen [facility], BSN RN Case  Manager [facility] [phone]

## 2021-03-03 NOTE — Progress Notes (Signed)
Remote pacemaker transmission.   

## 2021-03-03 NOTE — Chronic Care Management (AMB) (Signed)
Chronic Care Management Pharmacy Note  03/03/2021 Name:  Reginald [name] MRN:  [id number] DOB:  1970-07-04  Summary:  Patient's current condition appears stable. Recent blood glucose range per verbal report is 120-150. Weight has been mostly stable in the 480s. Mood has improved some after switching anxiety/depression regimen to bupropion and hydroxyzine. Patient has started Spiriva inhaler for COPD. Will follow-up with patient in 3 weeks to renewal PAP applications for Rybelsus, Jardiance, and Spiriva for 2023. Patient has appointments scheduled for several specialists and was instructed to follow-up with those. Patient has a current referral for ENT in process.   Subjective: Reginald [name] is an 50 y.o. year old male who is a primary patient of [name], MD.  The CCM team was consulted for assistance with disease management and care coordination needs.    Engaged with patient by telephone for follow up visit in response to provider referral for pharmacy case management and/or care coordination services.   Consent to Services:  The patient was given information about Chronic Care Management services, agreed to services, and gave verbal consent prior to initiation of services.  Please see initial visit note for detailed documentation.   Patient Care Team: [name], MD as PCP - General (Internal Medicine) Thompson Grayer, MD as PCP - Electrophysiology (Cardiology) Josue Hector, MD as PCP - Cardiology (Cardiology) Kassie Mends, RN as [place] Management [place], Ranae Pila, LCSW as Social Worker (Licensed Clinical Social Worker) Beryle Lathe, [facility] (Pharmacist)  Objective:  Lab Results  Component Value Date   CREATININE 1.55 (H) 02/08/2021   CREATININE 1.39 (H) 02/02/2021   CREATININE 2.13 (H) 01/30/2021    Lab Results  Component Value Date   HGBA1C 5.4 11/12/2020   Last diabetic Eye exam: No results found for: HMDIABEYEEXA  Last  diabetic Foot exam: No results found for: HMDIABFOOTEX      Component Value Date/Time   CHOL 123 06/10/2020 0837   TRIG 281 (H) 06/10/2020 0837   HDL 25 (L) 06/10/2020 0837   CHOLHDL 4.9 06/10/2020 0837   CHOLHDL 6.6 01/01/2020 1014   VLDL 73 (H) 01/01/2020 1014   LDLCALC 54 06/10/2020 0837    Hepatic Function Latest Ref Rng & Units 11/24/2020 11/15/2020 11/14/2020  Total Protein 6.0 - 8.5 g/dL 7.9 8.0 7.8  Albumin 4.0 - 5.0 g/dL 4.3 3.6 3.6  AST 0 - 40 IU/L 41(H) 32 30  ALT 0 - 44 IU/L _0 Alk Phosphatase 44 - 121 IU/L 98 76 72  Total Bilirubin 0.0 - 1.2 mg/dL 0.7 1.4(H) 1.2  Bilirubin, Direct 0.0 - 0.2 mg/dL - - -    Lab Results  Component Value Date/Time   TSH 4.060 10/04/2020 09:43 AM   TSH 5.240 (H) 06/10/2020 08:37 AM   FREET4 1.30 10/04/2020 09:43 AM   FREET4 0.79 (L) 05/03/2018 02:04 PM    CBC Latest Ref Rng & Units 02/08/2021 02/02/2021 01/30/2021  WBC 4.0 - 10.5 K/uL 10.1 12.6(H) 12.5(H)  Hemoglobin 13.0 - 17.0 g/dL 11.1(L) 11.5(L) 11.4(L)  Hematocrit 39.0 - 52.0 % 36.3(L) 37.3(L) 37.8(L)  Platelets 150 - 400 K/uL 256 290 302    No results found for: VD25OH  Clinical ASCVD: No  The ASCVD Risk score (Arnett DK, et al., 2019) failed to calculate for the following reasons:   The valid total cholesterol range is 130 to 320 mg/dL     Social History   Tobacco Use  Smoking Status  Former   Packs/day: 2.00   Years: 16.00   Pack years: 32.00   Types: Cigarettes   Quit date: 08/23/2017   Years since quitting: 3.5  Smokeless Tobacco Never   BP Readings from Last 3 Encounters:  02/21/21 139/81  02/18/21 117/72  02/04/21 138/60   Pulse Readings from Last 3 Encounters:  02/21/21 79  02/18/21 78  02/04/21 84   Wt Readings from Last 3 Encounters:  02/21/21 (!) 486 lb (220.4 kg)  02/08/21 (!) 493 lb (223.6 kg)  02/04/21 (!) 493 lb 4.8 oz (223.8 kg)    Assessment: Review of patient past medical history, allergies, medications, health status, including  review of consultants reports, laboratory and other test data, was performed as part of comprehensive evaluation and provision of chronic care management services.   SDOH:  (Social Determinants of Health) assessments and interventions performed:    CCM Care Plan  No Known Allergies  Medications Reviewed Today     Reviewed by Beryle Lathe, [facility] (Pharmacist) on 03/03/21 at [place] List Status: <None>   Medication Order Taking? Sig Documenting Provider Last Dose Status Informant  acetaminophen (TYLENOL) 325 MG tablet [id number] Yes Take 650 mg by mouth every 6 (six) hours as needed for mild pain. [provider] Taking Active Self  albuterol (PROVENTIL) (2.5 MG/3ML) 0.083% nebulizer solution [id number] Yes INHALE 1 VIAL VIA NEBULIZER EVERY 6 HOURS AS NEEDED FOR WHEEZING OR SHORTNESS OF BREATH [name], MD Taking Active   albuterol (VENTOLIN HFA) 108 (90 Base) MCG/ACT inhaler [id number] Yes INHALE 2 PUFFS INTO THE LUNGS EVERY 6 (SIX) HOURS AS NEEDED FOR WHEEZING OR SHORTNESS OF BREATH. [name], MD Taking Active            Med Note Lia Hopping, Toniann Fail   Fri Jan 21, 2021  8:16 AM)    allopurinol (ZYLOPRIM) 300 MG tablet [id number] Yes TAKE 1 TABLET EVERY DAY [name], MD Taking Active Self  ammonium lactate (AMLACTIN) 12 % lotion [id number] Yes Apply 1 application topically as needed for dry skin. Felipa Furnace, DPM Taking Active Self  atorvastatin (LIPITOR) 40 MG tablet [id number] Yes TAKE 1 TABLET EVERY DAY [name], MD Taking Active   Blood Glucose Monitoring Suppl (TRUE METRIX METER) w/Device KIT [id number]  USE AS DIRECTED [name], MD  Active Self  buPROPion (WELLBUTRIN XL) 300 MG 24 hr tablet [id number] Yes Take 1 tablet (300 mg total) by mouth daily. [name], MD Taking Active   diclofenac Sodium (VOLTAREN) 1 % GEL [id number] No Apply 2 g topically 4 (four) times daily.  Patient not taking: No sig reported   Mordecai Rasmussen, MD Not Taking  Active   docusate sodium (COLACE) 100 MG capsule [id number] Yes Take 2 capsules (200 mg total) by mouth 2 (two) times daily. Thurnell Lose, MD Taking Active            Med Note Celesta Aver, [facility] M   Mon Feb 21, 2021  3:06 PM) 2 once a day  empagliflozin (JARDIANCE) 10 MG TABS tablet [id number] Yes Take 1 tablet (10 mg total) by mouth daily before breakfast. Ahmed Prima Fransisco Hertz, PA-C Taking Active            Med Note Beryle Lathe   Thu Feb 03, 2021  1:56 PM) Currently has samples from his cardiology provider  FEROSUL 325 (65 Fe) MG tablet [id number] Yes TAKE 1 TABLET EVERY DAY [name], MD Taking  Active   fexofenadine (ALLEGRA) 180 MG tablet [id number] Yes Take 180 mg by mouth daily. [provider] Taking Active   gabapentin (NEURONTIN) 100 MG capsule [id number] Yes Take 1 capsule in the morning and 2 capsules in the evening. Thurnell Lose, MD Taking Active   hydrOXYzine (ATARAX/VISTARIL) 25 MG tablet [id number] Yes Take 1 tablet (25 mg total) by mouth every 8 (eight) hours as needed. [name], MD Taking Active   Krill Oil 350 MG CAPS [id number] Yes Take 1 capsule by mouth in the morning and at bedtime.  [provider] Taking Active Self           Med Note Lia Hopping, MINDY L   Fri Jan 21, 2021  8:16 AM)    lidocaine-prilocaine (EMLA) cream [id number] Yes Apply 1 application topically as needed. Chase Picket, MD Taking Active   lisinopril (ZESTRIL) 20 MG tablet [id number] Yes Take 1 tablet (20 mg total) by mouth daily. Erma Heritage, PA-C Taking Active   metFORMIN (GLUCOPHAGE) 850 MG tablet [id number] Yes Take 1 tablet (850 mg total) by mouth 2 (two) times daily with a meal. [name], MD Taking Active Self  metolazone (ZAROXOLYN) 5 MG tablet [id number] Yes TAKE 1 TABLET (5 MG TOTAL) BY MOUTH THREE TIMES A WEEK. Thompson Grayer, MD Taking Active            Med Note Celesta Aver, [facility] Ronald Lobo Feb 21, 2021  3:08 PM) prn  metoprolol tartrate (LOPRESSOR)  100 MG tablet [id number] Yes TAKE 1 TABLET TWICE DAILY Josue Hector, MD Taking Active   pantoprazole (PROTONIX) 40 MG tablet [id number] Yes Take 1 tablet (40 mg total) by mouth daily. [name], MD Taking Active Self  polyethylene glycol (MIRALAX) 17 g packet [id number] Yes Take 17 g by mouth daily as needed. Thurnell Lose, MD Taking Active   potassium chloride SA (KLOR-CON) 20 MEQ tablet [id number] Yes TAKE 3 TABS DAILY AND TAKE AN ADDITIONAL 1 TAB ON THE DAY YOU TAKE YOUR WEEKLY METOLAZONE DOSE Ahmed Prima, Fransisco Hertz, PA-C Taking Active   rivaroxaban (XARELTO) 20 MG TABS tablet [id number] Yes TAKE 1 Tablet BY MOUTH ONCE EVERY DAY WITH SUPPER Josue Hector, MD Taking Active Self  Semaglutide (RYBELSUS) 14 MG TABS [id number] Yes Take 14 mg by mouth in the morning. [name], MD Taking Active   terbinafine (LAMISIL) 250 MG tablet [id number] Yes Take 1 tablet by mouth once daily [name], MD Taking Active   Tiotropium Bromide Monohydrate (SPIRIVA RESPIMAT) 2.5 MCG/ACT AERS [id number] Yes Inhale 2 puffs into the lungs daily. [name], MD Taking Active            Med Note Jim Like Mar 03, 2021  9:24 AM)    torsemide (DEMADEX) 20 MG tablet [id number] Yes Take 2 tablets (40 mg total) by mouth 2 (two) times daily. Erma Heritage, [place] Taking Active   TRUE METRIX BLOOD GLUCOSE TEST test strip [id number]  TEST ONE TIME DAILY FOR DIABETES [name], MD  Active Self  TRUEplus Lancets 33G MISC [id number]  USE TO TEST ONE TIME DAILY FOR DIABETES [name], MD  Active Self            Patient Active Problem List   Diagnosis Date Noted   Iron deficiency anemia 02/21/2021   Constipation 02/21/2021   Rectal bleeding    Chronic blood loss anemia 11/12/2020   Gastrointestinal hemorrhage  10/08/2020   S/P placement of cardiac pacemaker 02/10/2020   Encounter for examination following treatment at hospital 02/10/2020   Anxiety 02/10/2020   DM2  (diabetes mellitus, type 2) ([place]) 02/10/2020   OSA on CPAP    Abnormal liver function 03/16/2019   History of pulmonary embolism 03/16/2019   Paroxysmal atrial fibrillation ([place]) 09/05/2018   COPD (chronic obstructive pulmonary disease) ([place]) 05/07/2018   Gastroesophageal reflux disease    Pulmonary embolism ([place]) 09/18/2017   Encounter for therapeutic drug monitoring 09/04/2017   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 08/23/2017   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   Pulmonary hypertension ([place])    Chronic periodontitis 07/18/2017   Morbid obesity ([place])    Essential hypertension    Tobacco abuse    Chronic heart failure with preserved ejection fraction (HFpEF) ([place])    Chronic venous insufficiency     Immunization History  Administered Date(s) Administered   Influenza Inj Mdck Quad Pf 02/27/2019   Influenza,inj,Quad PF,6+ Mos 02/10/2020   Janssen (J&J) SARS-COV-2 Vaccination 09/16/2019   PFIZER(Purple Top)SARS-COV-2 Vaccination 03/15/2020   Pneumococcal Polysaccharide-23 03/17/2019    Conditions to be addressed/monitored: Atrial Fibrillation, CHF, HTN, DMII, Anxiety, Depression, and chronic obstructive pulmonary disease  Care Plan : Medication Management  Updates made by Beryle Lathe, [place] since 03/03/2021 12:00 AM     Problem: COPD, HFpEF, HTN, T2DM, Afib, anxiety/depression   Priority: High  Onset Date: 12/28/2020     Long-Range Goal: Disease Progression Prevention   Start Date: 12/28/2020  Expected End Date: 03/28/2021  Recent Progress: On track  Priority: High  Note:   Current Barriers:  Unable to independently monitor therapeutic efficacy  Pharmacist Clinical Goal(s):  Over the next 90 days, patient will achieve adherence to monitoring guidelines and medication adherence to achieve therapeutic efficacy through collaboration with PharmD and provider.   Interventions: 1:1 collaboration  with [name], MD regarding development and update of comprehensive plan of care as evidenced by provider attestation and co-signature Inter-disciplinary care team collaboration (see longitudinal plan of care) Comprehensive medication review performed; medication list updated in electronic medical record  Diabetes: Current medications: metformin 850 mg by mouth twice daily, Jardiance 10 mg by mouth daily (receives from [employer]), and semaglutide (Rybelsus) (receives from [employer]) 14 mg by mouth daily   Intolerances: none Taking medications as directed: yes Side effects thought to be attributed to current medication regimen: GI upset with metformin but ok with continuing for now given benefits Patient denies hypoglycemia Current meal patterns: breakfast: eggs, oatmeal, chicken sandwhich, and baked beans ; lunch:  ribeye and [employer] Breast in air fryer ; dinner:  leftovers from lunch ; snacks: none; drinks: water, regular soda, milk, and sports drinks Has noticed decreased appetite since starting Rybelsus. Is not having any nausea or vomiting Current exercise: none Controlled; Most recent A1c at goal of <7% per ADA guidelines Current glucose readings over last week: 120-150s Medication: Identify diabetes medication and when best taken Monitoring: target blood sugar range, when to test Hypoglycemia: I have discussed with the patient how to treat hypoglycemia by the rule of 15; eat/drink 15g of sugar in the form of glucose tabs, 4 ounces of juice or soda and recheck fingerstick glucose in 15 minutes. Chocolate bars and ice cream should be avoided because fat delays carbohydrate digestion and absorption. Retreat if glucose remains low. Driving should cease until glucose is normal. Continue metformin 850 mg by mouth twice daily,  Jardiance 10 mg by mouth daily and semaglutide (Rybelsus) 14 mg by mouth at least 30 minutes before the first food, beverage, or other oral medications of the  day with no more than 4 ounces of plain water only Instructed to monitor blood sugars once a day at the following times: fasting (at least 8 hours since last food consumption), bedtime, and whenever patient experiences symptoms of hypo/hyperglycemia Encouraged regular aerobic exercise with a goal of 30 minutes five times per week (150 minutes per week)  Hypertension: Current medications: lisinopril 20 mg by mouth once daily and metoprolol tartrate 100 mg by mouth twice daily Patient also takes diuretics (torsemide + metolazone as needed) Intolerances: none Taking medications as directed: yes Side effects thought to be attributed to current medication regimen: no Reports some orthostatic hypotension but knows to get up slowly Current exercise: none Recent home blood pressure readings: 130-140s/80s Blood pressure under fair control. Factors affecting control of BP include high salt intake, elevated BMI, lack of exercise, sleep apnea, and volume overload. Blood pressure is at goal of <130/80 mmHg per 2017 AHA/ACC guidelines. Continue lisinopril 20 mg by mouth once daily + diuretics per cardiology instructions Encourage dietary sodium restriction/DASH diet Recommend regular aerobic exercise Recommend home blood pressure monitoring, to bring results in next visit Discussed need for and importance of continued work on weight loss Discussed need for medication compliance Reviewed risks of hypertension, principles of treatment and consequences of untreated hypertension Patient received CPAP on 01/20/21 which will likely improve blood pressure   Heart failure with preserved ejection fraction (LVEF >50% with evidence of spontaneous or provokable increased LV filling pressures): Appropriately managed Has pacemaker Current treatment: lisinopril 20 mg by mouth once daily, metoprolol tartrate 100 mg by mouth twice daily, and empagliflozin (Jardiance) 10 mg by mouth once daily Diuretic therapy: Torsemide  40 mg by mouth twice daily and metolazone as needed for fluid  Stage C (Symptomatic heart failure)/NYHA Class III (Marked limitation of physical activity. Comfortable at rest. Less than ordinary activity causes fatigue, palpitation, or dyspnea) Most recent echocardiogram was in March 2022. LVEF was stable at 60-65%. BNP: 67 (01/30/21) Current home vitals: BP 130-140s/80s Current home weights: mostly stable in the 480s; patient reports weight was 491 on 02/28/21. Took a metolazone and weight back down to low 480s Reports shortness of breath with activity or when lying down, fatigue and weakness, swelling in the legs, ankles and feet, and reduced ability to exercise. Denies persistent cough or wheezing, nausea and lack of appetite, and chest pain Continue Jardiance 10 mg by mouth daily, lisinopril 20 mg by mouth once daily, and metoprolol tartrate 100 mg by mouth twice daily Follow cardiology recommendations regarding torsemide and metolazone Continue to watch serum creatinine and potassium levels closely Encourage dietary sodium restriction (<3 g/day) Educated on the importance of weighing daily. Patient aware to contact cardiology/primary care team if weight gain >3 lbs in 1 day or >5 lbs in 1 week Recommend home blood pressure monitoring, to bring results in next visit Discussed need for and importance of continued work on weight loss Discussed need for medication compliance Recommend restricted fluid intake (eg, 2 L/day)  Chronic Obstructive Pulmonary Disease: Uncontrolled - complicated by possible nasal obstruction. ENT referral in process to investigate. Patient now has CPAP machine but needs to have it read. Appointment scheduled for 04/01/21.  Stopped smoking 08/23/2017; smoked for 30 years (1 PPD) Current treatment: albuterol metered dose (ProAir, Ventolin, Proventil) 1 puff by mouth as needed for  shortness of breath and tiotropium (Spiriva Respimat) 2 inhalations once daily (Receives from  [employer]) Patient reports only using albuterol a couple times per week GOLD Classification: unknown CAT score (12/28/20): 23 Most recent Pulmonary Function Testing: N/A 0 exacerbations requiring treatment in the last 6 months  Current oxygen requirements: none Continue Spiriva 2 puffs once daily and albuterol as needed for shortness of breath   Patient was instructed on the appropriate inhaler technique for Spiriva Respimat on 02/08/21  Atrial Fibrillation: Controlled Current rate control: metoprolol tartrate 100 mg by mouth twice daily Most recent ECG: normal sinus Anticoagulation: rivaroxaban (Xarelto) 20 mg by mouth once daily Denies signs and symptoms of bleeding CHADS2VASc score: 3 (CHF, hypertension, and diabetes) Prior ablation: _0  Yes  _1  No Home blood pressure: 130-140s/80s Home heart rate: unknown Continue metoprolol  Continue rivaroxaban (Xarelto) 20 mg by mouth once daily Encouraged regular physical activity for general health benefits Recommend home blood pressure and heart rate monitoring, to bring results in next visit Discussed need for medication compliance Patient may qualify for patient assistance for Xarelto after he has spent at least 4% of his gross annual income at the pharmacy.  Anxiety/Depression: Improving per patient. Talking to his providers helps him. Has not cried recently which is an improvement and has had less outbursts of anger. Current medication: bupropion XL 300 mg by mouth daily (dose increased 02/16/21) and hydroxyzine 25 mg by mouth every 8 hours  Recently discontinued citalopram and buspirone since patient did not feel they were working well for him.   Patient Goals/Self-Care Activities Over the next 90 days, patient will:  take medications as prescribed check glucose twice daily (fasting and bedtime), document, and provide at future appointments check blood pressure at least once daily, document, and provide at future appointments weigh  daily, and contact provider if weight gain of 3 lbs in a day or more than 5 lbs in a week  Follow Up Plan: Face to Face appointment with care management team member scheduled for:  03/29/21      Medication Assistance:  see care plan for more details  Patient's preferred pharmacy is:  [facility] [address], [place]  [address] Phone: [phone] Fax: [phone]  [facility] [place], [place] [address] Phone: [phone] Fax: [phone]  Follow Up:  Patient agrees to Care Plan and Follow-up.  Plan: Face to Face appointment with care management team member scheduled for: 03/29/21  Kennon Holter, PharmD Clinical Pharmacist [facility] [phone]

## 2021-03-07 ENCOUNTER — Ambulatory Visit: Payer: Medicare HMO | Admitting: *Deleted

## 2021-03-07 ENCOUNTER — Other Ambulatory Visit: Payer: Self-pay

## 2021-03-07 DIAGNOSIS — I5032 Chronic diastolic (congestive) heart failure: Secondary | ICD-10-CM

## 2021-03-07 DIAGNOSIS — F339 Major depressive disorder, recurrent, unspecified: Secondary | ICD-10-CM

## 2021-03-07 DIAGNOSIS — F419 Anxiety disorder, unspecified: Secondary | ICD-10-CM

## 2021-03-07 DIAGNOSIS — J449 Chronic obstructive pulmonary disease, unspecified: Secondary | ICD-10-CM

## 2021-03-07 DIAGNOSIS — E1169 Type 2 diabetes mellitus with other specified complication: Secondary | ICD-10-CM

## 2021-03-08 ENCOUNTER — Other Ambulatory Visit: Payer: Self-pay | Admitting: Internal Medicine

## 2021-03-08 DIAGNOSIS — G609 Hereditary and idiopathic neuropathy, unspecified: Secondary | ICD-10-CM

## 2021-03-08 NOTE — Patient Instructions (Signed)
Visit Information  PATIENT GOALS:  Goals Addressed   None     The patient verbalized understanding of instructions, educational materials, and care plan provided today and declined offer to receive copy of patient instructions, educational materials, and care plan.   Telephone follow up appointment with care management team member scheduled for: 03/15/21 Eduard Clos MSW, LCSW Licensed Clinical Social Education officer, environmental Primary Care [phone]

## 2021-03-08 NOTE — Chronic Care Management (AMB) (Signed)
Chronic Care Management    Clinical Social Work Note  03/08/2021 Name: Reginald [name] MRN: [id number] DOB: Feb 15, 1971  Reginald [name] is a 50 y.o. year old male who is a primary care patient of [name], MD. The CCM team was consulted to assist the patient with chronic disease management and/or care coordination needs related to: Mental Health Counseling and Resources.   Engaged with patient by telephone for follow up visit in response to provider referral for social work chronic care management and care coordination services.   Consent to Services:  The patient was given information about Chronic Care Management services, agreed to services, and gave verbal consent prior to initiation of services.  Please see initial visit note for detailed documentation.   Patient agreed to services and consent obtained.   Assessment: Review of patient past medical history, allergies, medications, and health status, including review of relevant consultants reports was performed today as part of a comprehensive evaluation and provision of chronic care management and care coordination services.     SDOH (Social Determinants of Health) assessments and interventions performed:    Advanced Directives Status: Not addressed in this encounter.  CCM Care Plan  No Known Allergies  Outpatient Encounter Medications as of 03/07/2021  Medication Sig Note   acetaminophen (TYLENOL) 325 MG tablet Take 650 mg by mouth every 6 (six) hours as needed for mild pain.    albuterol (PROVENTIL) (2.5 MG/3ML) 0.083% nebulizer solution INHALE 1 VIAL VIA NEBULIZER EVERY 6 HOURS AS NEEDED FOR WHEEZING OR SHORTNESS OF BREATH    albuterol (VENTOLIN HFA) 108 (90 Base) MCG/ACT inhaler INHALE 2 PUFFS INTO THE LUNGS EVERY 6 (SIX) HOURS AS NEEDED FOR WHEEZING OR SHORTNESS OF BREATH.    allopurinol (ZYLOPRIM) 300 MG tablet TAKE 1 TABLET EVERY DAY    ammonium lactate (AMLACTIN) 12 % lotion Apply 1 application topically as needed for  dry skin.    atorvastatin (LIPITOR) 40 MG tablet TAKE 1 TABLET EVERY DAY    Blood Glucose Monitoring Suppl (TRUE METRIX METER) w/Device KIT USE AS DIRECTED    buPROPion (WELLBUTRIN XL) 300 MG 24 hr tablet Take 1 tablet (300 mg total) by mouth daily.    diclofenac Sodium (VOLTAREN) 1 % GEL Apply 2 g topically 4 (four) times daily. (Patient not taking: No sig reported)    docusate sodium (COLACE) 100 MG capsule Take 2 capsules (200 mg total) by mouth 2 (two) times daily. 02/21/2021: 2 once a day   empagliflozin (JARDIANCE) 10 MG TABS tablet Take 1 tablet (10 mg total) by mouth daily before breakfast. 03/03/2021: Gets from patient assistance ([employer])   FEROSUL 325 (65 Fe) MG tablet TAKE 1 TABLET EVERY DAY    fexofenadine (ALLEGRA) 180 MG tablet Take 180 mg by mouth daily.    gabapentin (NEURONTIN) 100 MG capsule Take 1 capsule in the morning and 2 capsules in the evening.    hydrOXYzine (ATARAX/VISTARIL) 25 MG tablet Take 1 tablet (25 mg total) by mouth every 8 (eight) hours as needed.    Krill Oil 350 MG CAPS Take 1 capsule by mouth in the morning and at bedtime.     lidocaine-prilocaine (EMLA) cream Apply 1 application topically as needed.    lisinopril (ZESTRIL) 20 MG tablet Take 1 tablet (20 mg total) by mouth daily.    metFORMIN (GLUCOPHAGE) 850 MG tablet Take 1 tablet (850 mg total) by mouth 2 (two) times daily with a meal.    metolazone (ZAROXOLYN) 5 MG tablet  TAKE 1 TABLET (5 MG TOTAL) BY MOUTH THREE TIMES A WEEK. 03/03/2021: Has only been using ~1 time per week recently to maintain fluid   metoprolol tartrate (LOPRESSOR) 100 MG tablet TAKE 1 TABLET TWICE DAILY    pantoprazole (PROTONIX) 40 MG tablet Take 1 tablet (40 mg total) by mouth daily.    polyethylene glycol (MIRALAX) 17 g packet Take 17 g by mouth daily as needed.    potassium chloride SA (KLOR-CON) 20 MEQ tablet TAKE 3 TABS DAILY AND TAKE AN ADDITIONAL 1 TAB ON THE DAY YOU TAKE YOUR WEEKLY METOLAZONE DOSE    rivaroxaban (XARELTO)  20 MG TABS tablet TAKE 1 Tablet BY MOUTH ONCE EVERY DAY WITH SUPPER    Semaglutide (RYBELSUS) 14 MG TABS Take 14 mg by mouth in the morning. 03/03/2021: Gets from patient assistance ([employer])   terbinafine (LAMISIL) 250 MG tablet Take 1 tablet by mouth once daily    Tiotropium Bromide Monohydrate (SPIRIVA RESPIMAT) 2.5 MCG/ACT AERS Inhale 2 puffs into the lungs daily. 03/03/2021: Gets from patient assistance ([employer])   torsemide (DEMADEX) 20 MG tablet Take 2 tablets (40 mg total) by mouth 2 (two) times daily.    TRUE METRIX BLOOD GLUCOSE TEST test strip TEST ONE TIME DAILY FOR DIABETES    TRUEplus Lancets 33G MISC USE TO TEST ONE TIME DAILY FOR DIABETES    No facility-administered encounter medications on file as of 03/07/2021.    Patient Active Problem List   Diagnosis Date Noted   Iron deficiency anemia 02/21/2021   Constipation 02/21/2021   Rectal bleeding    Chronic blood loss anemia 11/12/2020   Gastrointestinal hemorrhage 10/08/2020   S/P placement of cardiac pacemaker 02/10/2020   Encounter for examination following treatment at hospital 02/10/2020   Anxiety 02/10/2020   DM2 (diabetes mellitus, type 2) ([place]) 02/10/2020   OSA on CPAP    Abnormal liver function 03/16/2019   History of pulmonary embolism 03/16/2019   Paroxysmal atrial fibrillation ([place]) 09/05/2018   COPD (chronic obstructive pulmonary disease) ([place]) 05/07/2018   Gastroesophageal reflux disease    Pulmonary embolism ([place]) 09/18/2017   Encounter for therapeutic drug monitoring 09/04/2017   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 08/23/2017   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   Pulmonary hypertension ([place])    Chronic periodontitis 07/18/2017   Morbid obesity ([place])    Essential hypertension    Tobacco abuse    Chronic heart failure with preserved ejection fraction (HFpEF) ([place])    Chronic venous insufficiency     Conditions to be  addressed/monitored: Depression; Mental Health Concerns   Care Plan : LCSW PLAN OF CARE  Updates made by Deirdre Peer, LCSW since 03/08/2021 12:00 AM     Problem: Unmanaged depression   Priority: High     Long-Range Goal: Reduce symptoms/feelings related to depression   Start Date: 12/21/2020  Expected End Date: 04/20/2021  This Visit's Progress: On track  Recent Progress: On track  Priority: High  Note:   Current Barriers:  Chronic Mental Health needs related to depression Mental Health Concerns  Suicidal Ideation/Homicidal Ideation: No  Clinical Social Work Goal(s):  patient will work with SW  PRN  by telephone or in person to reduce or manage symptoms related to depression  patient will work with SW to address concerns related to depression and treatment options patient will work with PCP and Pharmacist to address needs related to medication management/adjustment of current RX depression regimen to  better treat symptoms patient will demonstrate improved health management independence as evidenced by reduction  of PHQ9 score  Interventions: 03/07/21- Pt reports [facility] was only able to link him virtual counseling and he wants in personl. CSW validated the in-person preference and will seek options for him.    02/04/21: Pt reports feeling better today. He is tired and feels his CPAP may be causing him to not sleep well. He plans to discuss further with provider.  CSW discussed his mental health with him- he is now willing to consider counseling and agrees to referral being made to [facility]. CSW advised pt to expect a call from [facility] to schedule him.   02/01/21: CSW received call from pt today who reports having a lot of side effects from medications (recently changed/adjusted). Per pt, "I am crying more and over nothing". He also shared that he called the 7 hotline for mental health crisis and was told the line was for people suicidal. Pt states, "I'm not suicidal and would  not do that because I love myself too much... and my pets...." he also states that his "faith prevents him from doing that".  Pt has a lot of anxiety today around his medications and reports being told to do different things by the different providers. CSW advised pt he would need to get guidance from clinical staff (PCP, [place], RN) and CSW will alert the team of above.  CSW did remind pt to call CSW anytime and to alwaysnot hesitate to call 911 if emergency arises.    01/14/21: CSW spoke with pt who admits to history of depression. Pt denies any current or past SI/HI, hospitalizations. Pt is currently on Buspar and Celexa for his depression; his PHQ9 score today was 21- CSW discussed with pt the significance/severity of his score and discussed options. Pt declines counseling support. He also declines referral to Psychiatry indicating he use to see "Dr Truman Hayward" who prescribed his meds but now his PCP does. Pt does not wish to be referred back to "Dr Truman Hayward" nor any other Psychiatrist. Pt also shares that the Buspar is 3 x daily- would like to consider other options that may be better coverage of his symptoms and needs; and less pills- "have to remember to take it".  CSW discussed HCPOA with pt; he does not have anyone appointed and states he does not have a relationship with his family. Encouraged pt to consider completion of Advance Directives- will mail pt packet to review.  Patient interviewed and appropriate assessments performed: PHQ 9 1:1 collaboration with [name], MD regarding development and update of comprehensive plan of care as evidenced by provider attestation and co-signature Patient interviewed and appropriate assessments performed Provided mental health counseling with regard to depression; medications, counseling options  Discussed plans with patient for ongoing care management follow up and provided patient with direct contact information for care management team Provided education and  assistance to client regarding Advanced Directives. Depression screen reviewed , PHQ2/ PHQ9 completed, Solution-Focused Strategies, Active listening / Reflection utilized , Psychoeducation for mental health needs , Reviewed mental health medications with patient and discussed compliance: discussed options for counseling and/or RX adjustments being made, Participation in counseling encouraged , Verbalization of feelings encouraged , Suicidal Ideation/Homicidal Ideation assessed:, Discussed [place] , Discussed referral to [facility] to assist with connecting to mental health provider, and pt declines counseling at this time  Patient Self Care Activities:  Self administers medications as prescribed Attends all scheduled provider  appointments Performs ADL's independently Performs IADL's independently Independent living  Patient Coping Strengths:  Hopefulness Self Advocate Able to Communicate Effectively  Patient Self Care Deficits:  Lacks social connections Family not involved/connected  Patient Goals:  -expect phone call from [facility] and schedule counseling visit with them - avoid negative self-talk - develop a personal safety plan - develop a plan to deal with triggers like holidays, anniversaries - have a plan for how to handle bad days - spend time or talk with others at least 2 to 3 times per week - spend time or talk with others every day - watch for early signs of feeling worse - call and visit an old friend - check out volunteer opportunities - laugh; watch a funny movie or comedian - perform a random act of kindness - practice relaxation or meditation daily - talk about feelings with a friend, family or spiritual advisor - practice positive thinking and self-talk - call to cancel if needed - keep a calendar with prescription refill dates - keep a calendar with appointment dates  Follow Up Plan: Appointment scheduled for SW follow up with client by phone  on: 03/07/21       Follow Up Plan: SW will follow up with patient by phone over the next 2 weeks      Eduard Clos MSW, LCSW Licensed Music therapist Primary Care [phone]

## 2021-03-11 ENCOUNTER — Other Ambulatory Visit: Payer: Self-pay | Admitting: Internal Medicine

## 2021-03-14 ENCOUNTER — Other Ambulatory Visit: Payer: Self-pay

## 2021-03-14 ENCOUNTER — Ambulatory Visit (INDEPENDENT_AMBULATORY_CARE_PROVIDER_SITE_OTHER): Payer: Medicare HMO

## 2021-03-14 ENCOUNTER — Other Ambulatory Visit: Payer: Self-pay | Admitting: Internal Medicine

## 2021-03-14 ENCOUNTER — Telehealth: Payer: Self-pay

## 2021-03-14 VITALS — Ht 71.0 in | Wt >= 6400 oz

## 2021-03-14 DIAGNOSIS — Z0000 Encounter for general adult medical examination without abnormal findings: Secondary | ICD-10-CM

## 2021-03-14 DIAGNOSIS — Z9181 History of falling: Secondary | ICD-10-CM

## 2021-03-14 DIAGNOSIS — J329 Chronic sinusitis, unspecified: Secondary | ICD-10-CM

## 2021-03-14 DIAGNOSIS — R5381 Other malaise: Secondary | ICD-10-CM

## 2021-03-14 MED ORDER — MISC. DEVICES MISC: 0 refills | Status: DC

## 2021-03-14 NOTE — Telephone Encounter (Signed)
Spoke with pt for AWV. Pt states that he would like a referral to an ENT in [place] instead of [place].  Pt also asks if he can get an Rx sent to [facility] for a new rolling walker, one that will hold over 500#. His old walker broke.

## 2021-03-14 NOTE — Telephone Encounter (Signed)
Sent to [facility] 

## 2021-03-14 NOTE — Progress Notes (Signed)
Subjective:   Reginald [name] is a 50 y.o. male who presents for Medicare Annual/Subsequent preventive examination. Virtual Visit via Telephone Note  I connected with  Reginald [name] on 03/14/21 at 10:20 AM EDT by telephone and verified that I am speaking with the correct person using two identifiers.  Location: Patient: Home  Provider: [facility] Persons participating in the virtual visit: patient/Nurse Health Advisor   I discussed the limitations, risks, security and privacy concerns of performing an evaluation and management service by telephone and the availability of in person appointments. The patient expressed understanding and agreed to proceed.  Interactive audio and video telecommunications were attempted between this nurse and patient, however failed, due to patient having technical difficulties OR patient did not have access to video capability.  We continued and completed visit with audio only.  Some vital signs may be absent or patient reported.   [name], LPN  Review of Systems     Cardiac Risk Factors include: diabetes mellitus;hypertension;male gender;sedentary lifestyle;obesity (BMI >30kg/m2)     Objective:    Today's Vitals   03/14/21 1031 03/14/21 1033  Weight: (!) 493 lb (223.6 kg)   Height: '5\' 11"'  (1.803 m)   PainSc:  0-No pain   Body mass index is 68.76 kg/m.  Advanced Directives 03/14/2021 02/02/2021 01/30/2021 12/07/2020 11/12/2020 11/12/2020 07/09/2020  Does Patient Have a Medical Advance Directive? No No No No No No No  Would patient like information on creating a medical advance directive? No - Patient declined - No - Patient declined No - Patient declined No - Patient declined No - Patient declined No - Guardian declined    Current Medications (verified) Outpatient Encounter Medications as of 03/14/2021  Medication Sig   acetaminophen (TYLENOL) 325 MG tablet Take 650 mg by mouth every 6 (six) hours as needed for mild pain.   albuterol (PROVENTIL)  (2.5 MG/3ML) 0.083% nebulizer solution INHALE 1 VIAL VIA NEBULIZER EVERY 6 HOURS AS NEEDED FOR WHEEZING OR SHORTNESS OF BREATH   albuterol (VENTOLIN HFA) 108 (90 Base) MCG/ACT inhaler INHALE 2 PUFFS INTO THE LUNGS EVERY 6 (SIX) HOURS AS NEEDED FOR WHEEZING OR SHORTNESS OF BREATH.   allopurinol (ZYLOPRIM) 300 MG tablet TAKE 1 TABLET EVERY DAY   ammonium lactate (AMLACTIN) 12 % lotion Apply 1 application topically as needed for dry skin.   atorvastatin (LIPITOR) 40 MG tablet TAKE 1 TABLET EVERY DAY   Blood Glucose Monitoring Suppl (TRUE METRIX METER) w/Device KIT USE AS DIRECTED   buPROPion (WELLBUTRIN XL) 300 MG 24 hr tablet Take 1 tablet (300 mg total) by mouth daily.   diclofenac Sodium (VOLTAREN) 1 % GEL Apply 2 g topically 4 (four) times daily.   docusate sodium (COLACE) 100 MG capsule Take 2 capsules (200 mg total) by mouth 2 (two) times daily.   empagliflozin (JARDIANCE) 10 MG TABS tablet Take 1 tablet (10 mg total) by mouth daily before breakfast.   esomeprazole (NEXIUM) 20 MG capsule Take 20 mg by mouth daily at 12 noon.   FEROSUL 325 (65 Fe) MG tablet TAKE 1 TABLET EVERY DAY   fexofenadine (ALLEGRA) 180 MG tablet Take 180 mg by mouth daily.   gabapentin (NEURONTIN) 100 MG capsule TAKE 1 CAPSULE IN THE MORNING AND TAKE 2 CAPSULES IN THE EVENING   hydrOXYzine (ATARAX/VISTARIL) 25 MG tablet Take 1 tablet (25 mg total) by mouth every 8 (eight) hours as needed.   Krill Oil 350 MG CAPS Take 1 capsule by mouth in the morning  and at bedtime.    lidocaine-prilocaine (EMLA) cream Apply 1 application topically as needed.   lisinopril (ZESTRIL) 20 MG tablet Take 1 tablet (20 mg total) by mouth daily.   metFORMIN (GLUCOPHAGE) 850 MG tablet TAKE 1 TABLET TWICE DAILY WITH MEALS   metolazone (ZAROXOLYN) 5 MG tablet TAKE 1 TABLET (5 MG TOTAL) BY MOUTH THREE TIMES A WEEK.   metoprolol tartrate (LOPRESSOR) 100 MG tablet TAKE 1 TABLET TWICE DAILY   pantoprazole (PROTONIX) 40 MG tablet Take 1 tablet (40 mg  total) by mouth daily.   polyethylene glycol (MIRALAX) 17 g packet Take 17 g by mouth daily as needed.   potassium chloride SA (KLOR-CON) 20 MEQ tablet TAKE 3 TABS DAILY AND TAKE AN ADDITIONAL 1 TAB ON THE DAY YOU TAKE YOUR WEEKLY METOLAZONE DOSE   rivaroxaban (XARELTO) 20 MG TABS tablet TAKE 1 Tablet BY MOUTH ONCE EVERY DAY WITH SUPPER   Semaglutide (RYBELSUS) 14 MG TABS Take 14 mg by mouth in the morning.   terbinafine (LAMISIL) 250 MG tablet Take 1 tablet by mouth once daily   Tiotropium Bromide Monohydrate (SPIRIVA RESPIMAT) 2.5 MCG/ACT AERS Inhale 2 puffs into the lungs daily.   torsemide (DEMADEX) 20 MG tablet Take 2 tablets (40 mg total) by mouth 2 (two) times daily.   TRUE METRIX BLOOD GLUCOSE TEST test strip TEST ONE TIME DAILY FOR DIABETES   TRUEplus Lancets 33G MISC USE TO TEST ONE TIME DAILY FOR DIABETES   No facility-administered encounter medications on file as of 03/14/2021.    Allergies (verified) Patient has no known allergies.   History: Past Medical History:  Diagnosis Date   Anxiety    Aortic stenosis    Arthritis    Asthma    Back pain    Cellulitis of skin with lymphangitis    CHF (congestive heart failure) (HCC)    Chronic diastolic congestive heart failure (HCC)    Chronic venous insufficiency    Constipation    COPD (chronic obstructive pulmonary disease) (HCC)    Depression    Diabetes ([place])    Dyspnea    Essential hypertension    Gout    Heart murmur    Hypertension    Morbid obesity ([place])    Obesity    Pneumonia    walking pneumonia   Prostatitis    Pulmonary embolism ([place])    Pulmonary hypertension (HCC)    Pyelonephritis    S/P aortic valve replacement with bioprosthetic valve 08/23/2017   25 mm Edwards Inspiris Resilia stented bovine pericardial tissue valve   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   24 mm Hemashield supracoronary straight graft    Sleep apnea    cpap    Thoracic ascending aortic aneurysm    Thoracic ascending  aortic aneurysm    Tobacco abuse    Past Surgical History:  Procedure Laterality Date   AORTIC VALVE REPLACEMENT N/A 08/23/2017   Procedure: AORTIC VALVE REPLACEMENT (AVR) USING INSPIRIS RESILIA AORTIC VALVE SIZE 25 MM;  Surgeon: Rexene Alberts, MD;  Location: [place];  Service: Open Heart Surgery;  Laterality: N/A;   CARDIAC VALVE REPLACEMENT N/A    [employer] 02/07/2020   COLONOSCOPY WITH PROPOFOL N/A 11/14/2020   Procedure: COLONOSCOPY WITH PROPOFOL;  Surgeon: Gatha Mayer, MD;  Location: [facility] ENDOSCOPY;  Service: Endoscopy;  Laterality: N/A;   ESOPHAGOGASTRODUODENOSCOPY (EGD) WITH PROPOFOL N/A 11/14/2020   Procedure: ESOPHAGOGASTRODUODENOSCOPY (EGD) WITH PROPOFOL;  Surgeon: Gatha Mayer, MD;  Location: [place];  Service: Endoscopy;  Laterality: N/A;   MULTIPLE EXTRACTIONS WITH ALVEOLOPLASTY N/A 07/23/2017   Procedure: Extraction of tooth #10 with alveoloplasty and gross debridement of remaining teeth;  Surgeon: Lenn Cal, DDS;  Location: [place];  Service: Oral Surgery;  Laterality: N/A;   PACEMAKER IMPLANT N/A 08/28/2017    St Jude Medical Assurity MRI conditional  dual-chamber pacemaker for symptomatic complete heart blockby Dr Rayann Heman   TEE WITHOUT CARDIOVERSION N/A 07/16/2017   Procedure: TRANSESOPHAGEAL ECHOCARDIOGRAM (TEE);  Surgeon: Lelon Perla, MD;  [facility];  Service: Cardiovascular;  Laterality: N/A;   TEE WITHOUT CARDIOVERSION N/A 08/23/2017   Procedure: TRANSESOPHAGEAL ECHOCARDIOGRAM (TEE);  Surgeon: Rexene Alberts, MD;  Location: [place];  Service: Open Heart Surgery;  Laterality: N/A;   THORACIC AORTIC ANEURYSM REPAIR N/A 08/23/2017   Procedure: THORACIC ASCENDING ANEURYSM REPAIR (AAA) USING HEMASHIELD GOLD KNITTED MICROVEL DOUBLE VELOUR VASCULAR GRAFT D: 24 MM  L: 30 CM;  Surgeon: Rexene Alberts, MD;  Location: [place];  Service: Open Heart Surgery;  Laterality: N/A;   Family History  Problem Relation Age of Onset   Hypertension Mother     Alzheimer's disease Mother    Heart attack Mother    Heart attack Brother    Diabetes Brother    Social History   Socioeconomic History   Marital status: Divorced    Spouse name: Not on file   Number of children: Not on file   Years of education: Not on file   Highest education level: Not on file  Occupational History   Not on file  Tobacco Use   Smoking status: Former    Packs/day: 2.00    Years: 16.00    Pack years: 32.00    Types: Cigarettes    Quit date: 08/23/2017    Years since quitting: 3.5   Smokeless tobacco: Never  Vaping Use   Vaping Use: Never used  Substance and Sexual Activity   Alcohol use: No    Comment: have had alcohol in the past, not heavy   Drug use: No   Sexual activity: Not Currently    Partners: Female  Other Topics Concern   Not on file  Social History Narrative   Not on file   Social Determinants of Health   Financial Resource Strain: Low Risk    Difficulty of Paying Living Expenses: Not hard at all  Food Insecurity: No Food Insecurity   Worried About Charity fundraiser in the Last Year: Never true   [place] in the Last Year: Never true  Transportation Needs: No Transportation Needs   Lack of Transportation (Medical): No   Lack of Transportation (Non-Medical): No  Physical Activity: Inactive   Days of Exercise per Week: 0 days   Minutes of Exercise per Session: 0 min  Stress: No Stress Concern Present   Feeling of Stress : Only a little  Social Connections: Socially Isolated   Frequency of Communication with Friends and Family: Once a week   Frequency of Social Gatherings with Friends and Family: Never   Attends Religious Services: Never   Printmaker: No   Attends Music therapist: More than 4 times per year   Marital Status: Never married    Tobacco Counseling Counseling given: Not Answered   Clinical Intake:  Pre-visit preparation completed: Yes  Pain : No/denies  pain Pain Score: 0-No pain     BMI - recorded: 68.76 Nutritional Status: BMI > 30  Obese  Nutritional Risks: None Diabetes: Yes  How often do you need to have someone help you when you read instructions, pamphlets, or other written materials from your doctor or pharmacy?: 1 - Never  Diabetic?Nutrition Risk Assessment:  Has the patient had any N/V/D within the last 2 months?  No  Does the patient have any non-healing wounds?  Yes  Has the patient had any unintentional weight loss or weight gain?  No   Diabetes:  Is the patient diabetic?  Yes  If diabetic, was a CBG obtained today?  No  Did the patient bring in their glucometer from home?  No  How often do you monitor your CBG's? Daily, 200 this AM per pt.   Financial Strains and Diabetes Management:  Are you having any financial strains with the device, your supplies or your medication? No .  Does the patient want to be seen by Chronic Care Management for management of their diabetes?  No  Would the patient like to be referred to a Nutritionist or for Diabetic Management?  No   Diabetic Exams:  Diabetic Eye Exam: Completed 09/29/2020.  Pt has been advised about the importance in completing this exam.  Diabetic Foot Exam: Completed 10/01/2020. Pt has been advised about the importance in completing this exam.      Information entered by :: MJ Azyriah Nevins, LPN   Activities of Daily Living In your present state of health, do you have any difficulty performing the following activities: 03/14/2021 11/12/2020  Hearing? N N  Vision? N N  Difficulty concentrating or making decisions? Y N  Walking or climbing stairs? Y Y  Dressing or bathing? N Y  Doing errands, shopping? N N  Preparing Food and eating ? N -  Using the Toilet? N -  In the past six months, have you accidently leaked urine? Y -  Do you have problems with loss of bowel control? Y -  Managing your Medications? N -  Managing your Finances? N -  Housekeeping or managing  your Housekeeping? Y -  Comment Due to limited mobility. -  Some recent data might be hidden    Patient Care Team: [name], MD as PCP - General (Internal Medicine) Thompson Grayer, MD as PCP - Electrophysiology (Cardiology) Josue Hector, MD as PCP - Cardiology (Cardiology) Kassie Mends, RN as [place] [facility] [place], Ranae Pila, LCSW as Social Worker (Licensed Clinical Social Worker) Waldo Laine, Gwenyth Allegra, [facility] (Pharmacist)  Indicate any recent Medical Services you may have received from other than [facility] providers in the past year (date may be approximate).     Assessment:   This is a routine wellness examination for Sergi.  Hearing/Vision screen Hearing Screening - Comments:: No hearing issues. Vision Screening - Comments:: Glasses. 09/29/2020. [facility]  Dietary issues and exercise activities discussed: Current Exercise Habits: The patient does not participate in regular exercise at present, Exercise limited by: cardiac condition(s);Other - see comments (Morbid obestiy, mobility issues-uses cane)   Goals Addressed   None    Depression Screen PHQ 2/9 Scores 03/14/2021 02/04/2021 01/31/2021 01/27/2021 12/21/2020 12/07/2020 11/26/2020  PHQ - 2 Score 4 3 0 0 '6 4 6  ' PHQ- 9 Score 16 21 - - '17 14 18    ' Fall Risk Fall Risk  03/14/2021 03/14/2021 02/04/2021 01/31/2021 01/27/2021  Falls in the past year? '1 1 1 ' 0 0  Number falls in past yr: '1 1 1 ' 0 0  Injury with Fall? '1 1 1 ' 0 0  Risk for fall due to : History of fall(s);Impaired balance/gait;Impaired mobility;Impaired vision History of fall(s);Impaired balance/gait;Impaired mobility;Impaired vision History of fall(s);Impaired balance/gait;Impaired mobility No Fall Risks No Fall Risks  Follow up Falls prevention discussed Falls prevention discussed Falls evaluation completed;Education provided;Falls prevention discussed;Follow up appointment Falls evaluation completed Falls evaluation completed    FALL  RISK PREVENTION PERTAINING TO THE HOME:  Any stairs in or around the home? No  If so, are there any without handrails? No  Home free of loose throw rugs in walkways, pet beds, electrical cords, etc? Yes  Adequate lighting in your home to reduce risk of falls? Yes   ASSISTIVE DEVICES UTILIZED TO PREVENT FALLS:  Life alert? No  Use of a cane, walker or w/c? Yes  Grab bars in the bathroom? No  Shower chair or bench in shower? Yes  Elevated toilet seat or a handicapped toilet? No   TIMED UP AND GO:  Was the test performed? No . Phone visit.   Cognitive Function:     6CIT Screen 03/14/2021 03/11/2020  What Year? 0 points 0 points  What month? 0 points 0 points  What time? 0 points 0 points  Count back from 20 0 points 0 points  Months in reverse 2 points 0 points  Repeat phrase 2 points 0 points  Total Score 4 0    Immunizations Immunization History  Administered Date(s) Administered   Influenza Inj Mdck Quad Pf 02/27/2019   Influenza,inj,Quad PF,6+ Mos 02/10/2020   Janssen (J&J) SARS-COV-2 Vaccination 09/16/2019   PFIZER(Purple Top)SARS-COV-2 Vaccination 03/15/2020   Pneumococcal Polysaccharide-23 03/17/2019    TDAP status: Due, Education has been provided regarding the importance of this vaccine. Advised may receive this vaccine at local pharmacy or Health Dept. Aware to provide a copy of the vaccination record if obtained from local pharmacy or Health Dept. Verbalized acceptance and understanding.  Flu Vaccine status: Due, Education has been provided regarding the importance of this vaccine. Advised may receive this vaccine at local pharmacy or Health Dept. Aware to provide a copy of the vaccination record if obtained from local pharmacy or Health Dept. Verbalized acceptance and understanding.  Pneumococcal vaccine status: Due, Education has been provided regarding the importance of this vaccine. Advised may receive this vaccine at local pharmacy or Health Dept. Aware to  provide a copy of the vaccination record if obtained from local pharmacy or Health Dept. Verbalized acceptance and understanding.  Covid-19 vaccine status: Completed vaccines  Qualifies for Shingles Vaccine? Yes   Zostavax completed No   Shingrix Completed?: No.    Education has been provided regarding the importance of this vaccine. Patient has been advised to call insurance company to determine out of pocket expense if they have not yet received this vaccine. Advised may also receive vaccine at local pharmacy or Health Dept. Verbalized acceptance and understanding.  Screening Tests Health Maintenance  Topic Date Due   OPHTHALMOLOGY EXAM  Never done   TETANUS/TDAP  Never done   Pneumococcal Vaccine 13-31 Years old (2 - PCV) 03/16/2020   COVID-19 Vaccine (3 - Booster for Janssen series) 05/10/2020   INFLUENZA VACCINE  12/20/2020   HEMOGLOBIN A1C  05/14/2021   FOOT EXAM  10/01/2021   COLONOSCOPY (Pts 45-32yr Insurance coverage will need to be confirmed)  11/15/2030   Hepatitis C Screening  Completed   HIV Screening  Completed   HPV VACCINES  Aged Out    Health Maintenance  Health Maintenance Due  Topic Date Due   OPHTHALMOLOGY EXAM  Never done   TETANUS/TDAP  Never done   Pneumococcal Vaccine 68-103 Years old (2 - PCV) 03/16/2020   COVID-19 Vaccine (3 - Booster for Janssen series) 05/10/2020   INFLUENZA VACCINE  12/20/2020    Colorectal cancer screening: Type of screening: Colonoscopy. Completed 11/14/2020. Repeat every 10 years  Lung Cancer Screening: (Low Dose CT Chest recommended if Age 32-80 years, 30 pack-year currently smoking OR have quit w/in 15years.) does not qualify.  Additional Screening:  Hepatitis C Screening: does not qualify; Completed 10/04/2020.  Vision Screening: Recommended annual ophthalmology exams for early detection of glaucoma and other disorders of the eye. Is the patient up to date with their annual eye exam?  Yes  Who is the provider or what is  the name of the office in which the patient attends annual eye exams? [facility], [place] If pt is not established with a provider, would they like to be referred to a provider to establish care? No .   Dental Screening: Recommended annual dental exams for proper oral hygiene  Community Resource Referral / Chronic Care Management: CRR required this visit?  No   CCM required this visit?  No      Plan:     I have personally reviewed and noted the following in the patient's chart:   Medical and social history Use of alcohol, tobacco or illicit drugs  Current medications and supplements including opioid prescriptions. Patient is not currently taking opioid prescriptions. Functional ability and status Nutritional status Physical activity Advanced directives List of other physicians Hospitalizations, surgeries, and ER visits in previous 12 months Vitals Screenings to include cognitive, depression, and falls Referrals and appointments  In addition, I have reviewed and discussed with patient certain preventive protocols, quality metrics, and best practice recommendations. A written personalized care plan for preventive services as well as general preventive health recommendations were provided to patient.     [name], LPN   [id number]   Nurse Notes: Pt states he is doing okay. PHQ-9 score of 16. Offered counseling services but pqtient states he has already been contacted. Pt up to date on all health maintenance. Shingles vaccines due next year. Pt currently under guidance of CCM.

## 2021-03-14 NOTE — Patient Instructions (Signed)
Reginald [name] , Thank you for taking time to come for your Medicare Wellness Visit. I appreciate your ongoing commitment to your health goals. Please review the following plan we discussed and let me know if I can assist you in the future.   Screening recommendations/referrals: Colonoscopy: Done 11/14/2020 Repeat in 10 years  Recommended yearly ophthalmology/optometry visit for glaucoma screening and checkup Recommended yearly dental visit for hygiene and checkup  Vaccinations: Influenza vaccine: Due. Repeat annually  Pneumococcal vaccine: Done 03/17/2019  Tdap vaccine: Due Repeat in 10 years  Shingles vaccine: Shingrix discussed. Please contact your pharmacy for coverage information.     Covid-19: Done 09/16/2019 and 03/15/2020  Advanced directives: Advance directive discussed with you today. Even though you declined this today, please call our office should you change your mind, and we can give you the proper paperwork for you to fill out.   Conditions/risks identified: Aim for 30 minutes of exercise each day, drink 6-8 glasses of water and eat lots of fruits and vegetables.   Next appointment: Follow up in one year for your annual wellness visit 2023.  Preventive Care 40-64 Years, Male Preventive care refers to lifestyle choices and visits with your health care provider that can promote health and wellness. What does preventive care include? A yearly physical exam. This is also called an annual well check. Dental exams once or twice a year. Routine eye exams. Ask your health care provider how often you should have your eyes checked. Personal lifestyle choices, including: Daily care of your teeth and gums. Regular physical activity. Eating a healthy diet. Avoiding tobacco and drug use. Limiting alcohol use. Practicing safe sex. Taking low-dose aspirin every day starting at age 38. What happens during an annual well check? The services and screenings done by your health care  provider during your annual well check will depend on your age, overall health, lifestyle risk factors, and family history of disease. Counseling  Your health care provider may ask you questions about your: Alcohol use. Tobacco use. Drug use. Emotional well-being. Home and relationship well-being. Sexual activity. Eating habits. Work and work Statistician. Screening  You may have the following tests or measurements: Height, weight, and BMI. Blood pressure. Lipid and cholesterol levels. These may be checked every 5 years, or more frequently if you are over 56 years old. Skin check. Lung cancer screening. You may have this screening every year starting at age 92 if you have a 30-pack-year history of smoking and currently smoke or have quit within the past 15 years. Fecal occult blood test (FOBT) of the stool. You may have this test every year starting at age 17. Flexible sigmoidoscopy or colonoscopy. You may have a sigmoidoscopy every 5 years or a colonoscopy every 10 years starting at age 39. Prostate cancer screening. Recommendations will vary depending on your family history and other risks. Hepatitis C blood test. Hepatitis B blood test. Sexually transmitted disease (STD) testing. Diabetes screening. This is done by checking your blood sugar (glucose) after you have not eaten for a while (fasting). You may have this done every 1-3 years. Discuss your test results, treatment options, and if necessary, the need for more tests with your health care provider. Vaccines  Your health care provider may recommend certain vaccines, such as: Influenza vaccine. This is recommended every year. Tetanus, diphtheria, and acellular pertussis (Tdap, Td) vaccine. You may need a Td booster every 10 years. Zoster vaccine. You may need this after age 85. Pneumococcal 13-valent conjugate (PCV13) vaccine. You  may need this if you have certain conditions and have not been vaccinated. Pneumococcal  polysaccharide (PPSV23) vaccine. You may need one or two doses if you smoke cigarettes or if you have certain conditions. Talk to your health care provider about which screenings and vaccines you need and how often you need them. This information is not intended to replace advice given to you by your health care provider. Make sure you discuss any questions you have with your health care provider. Document Released: 06/04/2015 Document Revised: 01/26/2016 Document Reviewed: 03/09/2015 Elsevier Interactive Patient Education  2017 [place] Prevention in the Home Falls can cause injuries. They can happen to people of all ages. There are many things you can do to make your home safe and to help prevent falls. What can I do on the outside of my home? Regularly fix the edges of walkways and driveways and fix any cracks. Remove anything that might make you trip as you walk through a door, such as a raised step or threshold. Trim any bushes or trees on the path to your home. Use bright outdoor lighting. Clear any walking paths of anything that might make someone trip, such as rocks or tools. Regularly check to see if handrails are loose or broken. Make sure that both sides of any steps have handrails. Any raised decks and porches should have guardrails on the edges. Have any leaves, snow, or ice cleared regularly. Use sand or salt on walking paths during winter. Clean up any spills in your garage right away. This includes oil or grease spills. What can I do in the bathroom? Use night lights. Install grab bars by the toilet and in the tub and shower. Do not use towel bars as grab bars. Use non-skid mats or decals in the tub or shower. If you need to sit down in the shower, use a plastic, non-slip stool. Keep the floor dry. Clean up any water that spills on the floor as soon as it happens. Remove soap buildup in the tub or shower regularly. Attach bath mats securely with double-sided  non-slip rug tape. Do not have throw rugs and other things on the floor that can make you trip. What can I do in the bedroom? Use night lights. Make sure that you have a light by your bed that is easy to reach. Do not use any sheets or blankets that are too big for your bed. They should not hang down onto the floor. Have a firm chair that has side arms. You can use this for support while you get dressed. Do not have throw rugs and other things on the floor that can make you trip. What can I do in the kitchen? Clean up any spills right away. Avoid walking on wet floors. Keep items that you use a lot in easy-to-reach places. If you need to reach something above you, use a strong step stool that has a grab bar. Keep electrical cords out of the way. Do not use floor polish or wax that makes floors slippery. If you must use wax, use non-skid floor wax. Do not have throw rugs and other things on the floor that can make you trip. What can I do with my stairs? Do not leave any items on the stairs. Make sure that there are handrails on both sides of the stairs and use them. Fix handrails that are broken or loose. Make sure that handrails are as long as the stairways. Check any carpeting to make  sure that it is firmly attached to the stairs. Fix any carpet that is loose or worn. Avoid having throw rugs at the top or bottom of the stairs. If you do have throw rugs, attach them to the floor with carpet tape. Make sure that you have a light switch at the top of the stairs and the bottom of the stairs. If you do not have them, ask someone to add them for you. What else can I do to help prevent falls? Wear shoes that: Do not have high heels. Have rubber bottoms. Are comfortable and fit you well. Are closed at the toe. Do not wear sandals. If you use a stepladder: Make sure that it is fully opened. Do not climb a closed stepladder. Make sure that both sides of the stepladder are locked into place. Ask  someone to hold it for you, if possible. Clearly mark and make sure that you can see: Any grab bars or handrails. First and last steps. Where the edge of each step is. Use tools that help you move around (mobility aids) if they are needed. These include: Canes. Walkers. Scooters. Crutches. Turn on the lights when you go into a dark area. Replace any light bulbs as soon as they burn out. Set up your furniture so you have a clear path. Avoid moving your furniture around. If any of your floors are uneven, fix them. If there are any pets around you, be aware of where they are. Review your medicines with your doctor. Some medicines can make you feel dizzy. This can increase your chance of falling. Ask your doctor what other things that you can do to help prevent falls. This information is not intended to replace advice given to you by your health care provider. Make sure you discuss any questions you have with your health care provider. Document Released: 03/04/2009 Document Revised: 10/14/2015 Document Reviewed: 06/12/2014 Elsevier Interactive Patient Education  2017 [employer].

## 2021-03-15 ENCOUNTER — Ambulatory Visit: Payer: Medicare HMO | Admitting: *Deleted

## 2021-03-15 ENCOUNTER — Encounter: Payer: Self-pay | Admitting: Internal Medicine

## 2021-03-15 DIAGNOSIS — Z9181 History of falling: Secondary | ICD-10-CM

## 2021-03-15 DIAGNOSIS — F339 Major depressive disorder, recurrent, unspecified: Secondary | ICD-10-CM

## 2021-03-15 DIAGNOSIS — R5381 Other malaise: Secondary | ICD-10-CM

## 2021-03-15 MED ORDER — MISC. DEVICES MISC: 0 refills | Status: DC

## 2021-03-15 NOTE — Telephone Encounter (Signed)
Called pt and let him know it was sent to [facility] in [place]

## 2021-03-16 ENCOUNTER — Ambulatory Visit: Payer: Medicare HMO | Admitting: Pharmacist

## 2021-03-16 ENCOUNTER — Telehealth: Payer: Self-pay | Admitting: Internal Medicine

## 2021-03-16 DIAGNOSIS — E1169 Type 2 diabetes mellitus with other specified complication: Secondary | ICD-10-CM

## 2021-03-16 DIAGNOSIS — I480 Paroxysmal atrial fibrillation: Secondary | ICD-10-CM

## 2021-03-16 DIAGNOSIS — I10 Essential (primary) hypertension: Secondary | ICD-10-CM

## 2021-03-16 DIAGNOSIS — I5032 Chronic diastolic (congestive) heart failure: Secondary | ICD-10-CM

## 2021-03-16 DIAGNOSIS — F339 Major depressive disorder, recurrent, unspecified: Secondary | ICD-10-CM

## 2021-03-16 DIAGNOSIS — F419 Anxiety disorder, unspecified: Secondary | ICD-10-CM

## 2021-03-16 DIAGNOSIS — J449 Chronic obstructive pulmonary disease, unspecified: Secondary | ICD-10-CM

## 2021-03-16 MED ORDER — GLIPIZIDE ER 5 MG PO TB24: 5.0000 mg | ORAL_TABLET | Freq: Every day | ORAL | Status: DC

## 2021-03-16 NOTE — Patient Instructions (Signed)
Reginald [name],  It was great to talk to you today! Please restart taking glipizide XL 5 mg by mouth every morning with breakfast.   Please call me with any questions or concerns.   Visit Information  PATIENT GOALS:  Goals Addressed             This Visit's Progress    Medication Management       Patient Goals/Self-Care Activities Over the next 90 days, patient will:  Take medications as prescribed Check glucose twice daily (fasting and bedtime), document, and provide at future appointments Check blood pressure at least once daily, document, and provide at future appointments Weigh daily, and contact provider if weight gain of 3 lbs in a day or more than 5 lbs in a week                  Patient verbalizes understanding of instructions provided today and agrees to view in [place].   Face to Face appointment with care management team member scheduled for: 03/29/21  Kennon Holter, PharmD Clinical Pharmacist [facility] [phone]

## 2021-03-16 NOTE — Telephone Encounter (Signed)
Called and spoke with patient.

## 2021-03-16 NOTE — Telephone Encounter (Signed)
Pt called in about [facility] referral. Pt wants a [facility] affiliated office that can pull his info through [place] and have everything on one accord

## 2021-03-16 NOTE — Chronic Care Management (AMB) (Signed)
Chronic Care Management Pharmacy Note  03/16/2021 Name:  Reginald [name] MRN:  [id number] DOB:  11/10/1970  Summary:  Diabetes: Current glucose readings over last 2 weeks: 189-337; 337 was this morning after he ate pizza at 1 AM. Appears to have dietary indiscretion. Continues to drink soda and Kool-aid with splenda. Counseled to decrease sweetened beverages and focus more on drinking water Continue metformin 850 mg by mouth twice daily, Jardiance 10 mg by mouth daily and semaglutide (Rybelsus) 14 mg by mouth at least 30 minutes before the first food, beverage, or other oral medications of the day with no more than 4 ounces of plain water only Discussed with PCP and based on recent blood glucose readings, will restart patient on glipizide XL 5 mg by mouth every morning with breakfast. Patient has supply at home still. Will follow-up on blood glucose readings again at follow-up visit.   Chronic Obstructive Pulmonary Disease: Uncontrolled - complicated by possible nasal obstruction. ENT referral in process to investigate. Plans to see Dr. Benjamine Mola in [place] soon. Patient requested MyChart message for office address and phone number.   Other: Patient reported recent use of Nexium over the counter due to flare up of GERD. Patient instructed to discontinue Nexium since he is currently prescribed another PPI (pantoprazole). Instructed to take over the counter Pepcid or TUMS as needed in addition to pantoprazole 40 mg by mouth daily.   Subjective: Reginald [name] is an 50 y.o. year old male who is a primary patient of [name], MD.  The CCM team was consulted for assistance with disease management and care coordination needs.    Engaged with patient by telephone for follow up visit in response to provider referral for pharmacy case management and/or care coordination services.   Consent to Services:  The patient was given information about Chronic Care Management services, agreed to services,  and gave verbal consent prior to initiation of services.  Please see initial visit note for detailed documentation.   Patient Care Team: [name], MD as PCP - General (Internal Medicine) Thompson Grayer, MD as PCP - Electrophysiology (Cardiology) Josue Hector, MD as PCP - Cardiology (Cardiology) Kassie Mends, RN as [place] Management [place], Ranae Pila, LCSW as Social Worker (Licensed Clinical Social Worker) Beryle Lathe, [facility] (Pharmacist)  Objective:  Lab Results  Component Value Date   CREATININE 1.55 (H) 02/08/2021   CREATININE 1.39 (H) 02/02/2021   CREATININE 2.13 (H) 01/30/2021    Lab Results  Component Value Date   HGBA1C 5.4 11/12/2020   Last diabetic Eye exam: No results found for: HMDIABEYEEXA  Last diabetic Foot exam: No results found for: HMDIABFOOTEX      Component Value Date/Time   CHOL 123 06/10/2020 0837   TRIG 281 (H) 06/10/2020 0837   HDL 25 (L) 06/10/2020 0837   CHOLHDL 4.9 06/10/2020 0837   CHOLHDL 6.6 01/01/2020 1014   VLDL 73 (H) 01/01/2020 1014   LDLCALC 54 06/10/2020 0837    Hepatic Function Latest Ref Rng & Units 11/24/2020 11/15/2020 11/14/2020  Total Protein 6.0 - 8.5 g/dL 7.9 8.0 7.8  Albumin 4.0 - 5.0 g/dL 4.3 3.6 3.6  AST 0 - 40 IU/L 41(H) 32 30  ALT 0 - 44 IU/L _0 Alk Phosphatase 44 - 121 IU/L 98 76 72  Total Bilirubin 0.0 - 1.2 mg/dL 0.7 1.4(H) 1.2  Bilirubin, Direct 0.0 - 0.2 mg/dL - - -    Lab  Results  Component Value Date/Time   TSH 4.060 10/04/2020 09:43 AM   TSH 5.240 (H) 06/10/2020 08:37 AM   FREET4 1.30 10/04/2020 09:43 AM   FREET4 0.79 (L) 05/03/2018 02:04 PM    CBC Latest Ref Rng & Units 02/08/2021 02/02/2021 01/30/2021  WBC 4.0 - 10.5 K/uL 10.1 12.6(H) 12.5(H)  Hemoglobin 13.0 - 17.0 g/dL 11.1(L) 11.5(L) 11.4(L)  Hematocrit 39.0 - 52.0 % 36.3(L) 37.3(L) 37.8(L)  Platelets 150 - 400 K/uL 256 290 302    No results found for: VD25OH  Clinical ASCVD: No  The ASCVD Risk score  (Arnett DK, et al., 2019) failed to calculate for the following reasons:   The valid total cholesterol range is 130 to 320 mg/dL    Social History   Tobacco Use  Smoking Status Former   Packs/day: 2.00   Years: 16.00   Pack years: 32.00   Types: Cigarettes   Quit date: 08/23/2017   Years since quitting: 3.5  Smokeless Tobacco Never   BP Readings from Last 3 Encounters:  02/21/21 139/81  02/18/21 117/72  02/04/21 138/60   Pulse Readings from Last 3 Encounters:  02/21/21 79  02/18/21 78  02/04/21 84   Wt Readings from Last 3 Encounters:  03/14/21 (!) 493 lb (223.6 kg)  02/21/21 (!) 486 lb (220.4 kg)  02/08/21 (!) 493 lb (223.6 kg)    Assessment: Review of patient past medical history, allergies, medications, health status, including review of consultants reports, laboratory and other test data, was performed as part of comprehensive evaluation and provision of chronic care management services.   SDOH:  (Social Determinants of Health) assessments and interventions performed:    CCM Care Plan  No Known Allergies  Medications Reviewed Today     Reviewed by Beryle Lathe, [facility] (Pharmacist) on 03/16/21 at 1142  Med List Status: <None>   Medication Order Taking? Sig Documenting Provider Last Dose Status Informant  acetaminophen (TYLENOL) 325 MG tablet [id number] Yes Take 650 mg by mouth every 6 (six) hours as needed for mild pain. [provider] Taking Active Self  albuterol (PROVENTIL) (2.5 MG/3ML) 0.083% nebulizer solution [id number] Yes INHALE 1 VIAL VIA NEBULIZER EVERY 6 HOURS AS NEEDED FOR WHEEZING OR SHORTNESS OF BREATH [name], MD Taking Active   albuterol (VENTOLIN HFA) 108 (90 Base) MCG/ACT inhaler [id number] Yes INHALE 2 PUFFS INTO THE LUNGS EVERY 6 (SIX) HOURS AS NEEDED FOR WHEEZING OR SHORTNESS OF BREATH. [name], MD Taking Active            Med Note Lia Hopping, Toniann Fail   Fri Jan 21, 2021  8:16 AM)    allopurinol (ZYLOPRIM) 300 MG tablet  [id number] Yes TAKE 1 TABLET EVERY DAY [name], MD Taking Active Self  ammonium lactate (AMLACTIN) 12 % lotion [id number] Yes Apply 1 application topically as needed for dry skin. Felipa Furnace, DPM Taking Active Self  atorvastatin (LIPITOR) 40 MG tablet [id number] Yes TAKE 1 TABLET EVERY DAY [name], MD Taking Active   Blood Glucose Monitoring Suppl (TRUE METRIX METER) w/Device KIT [id number]  USE AS DIRECTED [name], MD  Active Self  buPROPion (WELLBUTRIN XL) 300 MG 24 hr tablet [id number] Yes Take 1 tablet (300 mg total) by mouth daily. [name], MD Taking Active   diclofenac Sodium (VOLTAREN) 1 % GEL [id number] Yes Apply 2 g topically 4 (four) times daily. Mordecai Rasmussen, MD Taking Active   docusate sodium (COLACE) 100 MG capsule [id number] Yes  Take 2 capsules (200 mg total) by mouth 2 (two) times daily. Thurnell Lose, MD Taking Active            Med Note Celesta Aver, [facility] M   Mon Feb 21, 2021  3:06 PM) 2 once a day  empagliflozin (JARDIANCE) 10 MG TABS tablet [id number] Yes Take 1 tablet (10 mg total) by mouth daily before breakfast. Erma Heritage, PA-C Taking Active            Med Note Jim Like Mar 03, 2021  9:34 AM) Gets from patient assistance [facility] Cares)  FEROSUL 325 (65 Fe) MG tablet [id number] Yes TAKE 1 TABLET EVERY DAY [name], MD Taking Active   fexofenadine (ALLEGRA) 180 MG tablet [id number] Yes Take 180 mg by mouth daily. [provider] Taking Active   gabapentin (NEURONTIN) 100 MG capsule [id number] Yes TAKE 1 CAPSULE IN THE MORNING AND TAKE 2 CAPSULES IN THE Gwyneth Sprout, Colin Broach, MD Taking Active   hydrOXYzine (ATARAX/VISTARIL) 25 MG tablet [id number] Yes Take 1 tablet (25 mg total) by mouth every 8 (eight) hours as needed. [name], MD Taking Active   Krill Oil 350 MG CAPS [id number] Yes Take 1 capsule by mouth in the morning and at bedtime.  [provider] Taking Active Self           Med Note  Lia Hopping, MINDY L   Fri Jan 21, 2021  8:16 AM)    lidocaine-prilocaine (EMLA) cream [id number] Yes Apply 1 application topically as needed. Chase Picket, MD Taking Active   lisinopril (ZESTRIL) 20 MG tablet [id number] Yes Take 1 tablet (20 mg total) by mouth daily. Erma Heritage, [place] Taking Active   metFORMIN (GLUCOPHAGE) 850 MG tablet [id number] Yes TAKE 1 TABLET TWICE DAILY WITH MEALS [name], MD Taking Active   metolazone (ZAROXOLYN) 5 MG tablet [id number] Yes TAKE 1 TABLET (5 MG TOTAL) BY MOUTH THREE TIMES A WEEK. Thompson Grayer, MD Taking Active            Med Note Jim Like Mar 03, 2021  9:35 AM) Has only been using ~1 time per week recently to maintain fluid  metoprolol tartrate (LOPRESSOR) 100 MG tablet [id number] Yes TAKE 1 TABLET TWICE DAILY Josue Hector, MD Taking Active   Misc. Devices MISC [id number]  Rolling walker - ICD10 - Z91.81, R53.81 [name], MD  Active   pantoprazole (PROTONIX) 40 MG tablet [id number] Yes Take 1 tablet (40 mg total) by mouth daily. [name], MD Taking Active Self  polyethylene glycol (MIRALAX) 17 g packet [id number] Yes Take 17 g by mouth daily as needed. Thurnell Lose, MD Taking Active   potassium chloride SA (KLOR-CON) 20 MEQ tablet [id number] Yes TAKE 3 TABS DAILY AND TAKE AN ADDITIONAL 1 TAB ON THE DAY YOU TAKE YOUR WEEKLY METOLAZONE DOSE Ahmed Prima, Fransisco Hertz, PA-C Taking Active   rivaroxaban (XARELTO) 20 MG TABS tablet [id number] Yes TAKE 1 Tablet BY MOUTH ONCE EVERY DAY WITH SUPPER Josue Hector, MD Taking Active Self  Semaglutide (RYBELSUS) 14 MG TABS [id number] Yes Take 14 mg by mouth in the morning. [name], MD Taking Active            Med Note Jim Like Mar 03, 2021  9:35 AM) Gets from patient assistance ([employer])  terbinafine (LAMISIL) 250 MG tablet [id number] Yes Take 1 tablet by mouth once daily  [name], MD Taking Active   Tiotropium Bromide Monohydrate (SPIRIVA  RESPIMAT) 2.5 MCG/ACT AERS [id number] Yes Inhale 2 puffs into the lungs daily. [name], MD Taking Active            Med Note Jim Like Mar 03, 2021  9:34 AM) Gets from patient assistance [facility] Cares)  torsemide (DEMADEX) 20 MG tablet [id number] Yes Take 2 tablets (40 mg total) by mouth 2 (two) times daily. Erma Heritage, [place] Taking Active   TRUE METRIX BLOOD GLUCOSE TEST test strip [id number]  TEST ONE TIME DAILY FOR DIABETES [name], MD  Active Self  TRUEplus Lancets 33G MISC [id number]  USE TO TEST ONE TIME DAILY FOR DIABETES [name], MD  Active Self            Patient Active Problem List   Diagnosis Date Noted   Iron deficiency anemia 02/21/2021   Constipation 02/21/2021   Rectal bleeding    Chronic blood loss anemia 11/12/2020   Gastrointestinal hemorrhage 10/08/2020   S/P placement of cardiac pacemaker 02/10/2020   Encounter for examination following treatment at hospital 02/10/2020   Anxiety 02/10/2020   DM2 (diabetes mellitus, type 2) ([place]) 02/10/2020   OSA on CPAP    Abnormal liver function 03/16/2019   History of pulmonary embolism 03/16/2019   Paroxysmal atrial fibrillation ([place]) 09/05/2018   COPD (chronic obstructive pulmonary disease) ([place]) 05/07/2018   Gastroesophageal reflux disease    Pulmonary embolism ([place]) 09/18/2017   Encounter for therapeutic drug monitoring 09/04/2017   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 08/23/2017   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   Pulmonary hypertension ([place])    Chronic periodontitis 07/18/2017   Morbid obesity ([place])    Essential hypertension    Tobacco abuse    Chronic heart failure with preserved ejection fraction (HFpEF) ([place])    Chronic venous insufficiency     Immunization History  Administered Date(s) Administered   Influenza Inj Mdck Quad Pf 02/27/2019   Influenza,inj,Quad PF,6+ Mos 02/10/2020   Janssen  (J&J) SARS-COV-2 Vaccination 09/16/2019   PFIZER(Purple Top)SARS-COV-2 Vaccination 03/15/2020   Pneumococcal Polysaccharide-23 03/17/2019    Conditions to be addressed/monitored: Atrial Fibrillation, CHF, HTN, COPD, DMII, Anxiety, and Depression  Care Plan : Medication Management  Updates made by Beryle Lathe, [place] since 03/16/2021 12:00 AM     Problem: COPD, HFpEF, HTN, T2DM, Afib, anxiety/depression   Priority: High  Onset Date: 12/28/2020     Long-Range Goal: Disease Progression Prevention   Start Date: 12/28/2020  Expected End Date: 03/28/2021  Recent Progress: On track  Priority: High  Note:   Current Barriers:  Unable to independently monitor therapeutic efficacy  Pharmacist Clinical Goal(s):  Over the next 90 days, patient will achieve adherence to monitoring guidelines and medication adherence to achieve therapeutic efficacy through collaboration with PharmD and provider.   Interventions: 1:1 collaboration with [name], MD regarding development and update of comprehensive plan of care as evidenced by provider attestation and co-signature Inter-disciplinary care team collaboration (see longitudinal plan of care) Comprehensive medication review performed; medication list updated in electronic medical record  Diabetes: Current medications: metformin 850 mg by mouth twice daily, Jardiance 10 mg by mouth daily (receives from [employer]), and semaglutide (Rybelsus) (receives from [employer]) 14 mg by mouth daily   Intolerances: none Taking medications as directed: yes Side effects thought to be attributed to current medication regimen: GI upset  with metformin but ok with continuing for now given benefits Patient denies recent hypoglycemia Current meal patterns: breakfast: eggs, oatmeal, chicken sandwhich, and baked beans ; lunch:  ribeye and [employer] Breast in air fryer ; dinner:  leftovers from lunch ; snacks: none; drinks: water, regular soda, milk, and  sports drinks Has noticed decreased appetite since starting Rybelsus. Is not having any nausea or vomiting Current exercise: none Controlled; Most recent A1c at goal of <7% per ADA guidelines Current glucose readings over last 2 weeks: 189-337; 337 was this morning after he ate pizza at 1 AM. Appears to have dietary indiscretion. Continues to drink soda and Kool-aid with splenda. Counseled to decrease sweetened beverages and focus more on drinking water Medication: Identify diabetes medication and when best taken Monitoring: target blood sugar range, when to test Hypoglycemia: I have discussed with the patient how to treat hypoglycemia by the rule of 15; eat/drink 15g of sugar in the form of glucose tabs, 4 ounces of juice or soda and recheck fingerstick glucose in 15 minutes. Chocolate bars and ice cream should be avoided because fat delays carbohydrate digestion and absorption. Retreat if glucose remains low. Driving should cease until glucose is normal. Continue metformin 850 mg by mouth twice daily, Jardiance 10 mg by mouth daily and semaglutide (Rybelsus) 14 mg by mouth at least 30 minutes before the first food, beverage, or other oral medications of the day with no more than 4 ounces of plain water only Discussed with PCP and based on recent blood glucose readings, will restart patient on glipizide XL 5 mg by mouth every morning with breakfast. Will follow-up on blood glucose readings again at follow-up visit.  Instructed to monitor blood sugars once a day at the following times: fasting (at least 8 hours since last food consumption), bedtime, and whenever patient experiences symptoms of hypo/hyperglycemia Encouraged regular aerobic exercise with a goal of 30 minutes five times per week (150 minutes per week)  Hypertension: Current medications: lisinopril 20 mg by mouth once daily and metoprolol tartrate 100 mg by mouth twice daily Patient also takes diuretics (torsemide + metolazone as  needed) Intolerances: none Taking medications as directed: yes Side effects thought to be attributed to current medication regimen: no Reports some orthostatic hypotension but knows to get up slowly Current exercise: none Recent home blood pressure readings: 130-140s/80s Blood pressure under fair control. Factors affecting control of BP include high salt intake, elevated BMI, lack of exercise, sleep apnea, and volume overload. Blood pressure is at goal of <130/80 mmHg per 2017 AHA/ACC guidelines. Continue lisinopril 20 mg by mouth once daily + diuretics per cardiology instructions Encourage dietary sodium restriction/DASH diet Recommend regular aerobic exercise Recommend home blood pressure monitoring, to bring results in next visit Discussed need for and importance of continued work on weight loss Discussed need for medication compliance Reviewed risks of hypertension, principles of treatment and consequences of untreated hypertension Patient received CPAP on 01/20/21 which will likely improve blood pressure   Heart failure with preserved ejection fraction (LVEF >50% with evidence of spontaneous or provokable increased LV filling pressures): Appropriately managed Has pacemaker Current treatment: lisinopril 20 mg by mouth once daily, metoprolol tartrate 100 mg by mouth twice daily, and empagliflozin (Jardiance) 10 mg by mouth once daily Diuretic therapy: Torsemide twice daily and metolazone as needed for fluid  Stage C (Symptomatic heart failure)/NYHA Class III (Marked limitation of physical activity. Comfortable at rest. Less than ordinary activity causes fatigue, palpitation, or dyspnea) Most recent echocardiogram  was in March 2022. LVEF was stable at 60-65%. BNP: 67 (01/30/21) Current home vitals: BP 130-140s/80s Current home weights: mostly stable in the 480s Reports shortness of breath with activity or when lying down, fatigue and weakness, swelling in the legs, ankles and feet, and  reduced ability to exercise. Denies persistent cough or wheezing, nausea and lack of appetite, and chest pain Continue Jardiance 10 mg by mouth daily, lisinopril 20 mg by mouth once daily, and metoprolol tartrate 100 mg by mouth twice daily Follow cardiology recommendations regarding torsemide and metolazone Continue to watch serum creatinine and potassium levels closely Encourage dietary sodium restriction (<3 g/day) Educated on the importance of weighing daily. Patient aware to contact cardiology/primary care team if weight gain >3 lbs in 1 day or >5 lbs in 1 week Recommend home blood pressure monitoring, to bring results in next visit Discussed need for and importance of continued work on weight loss Discussed need for medication compliance Recommend restricted fluid intake (eg, 2 L/day)  Chronic Obstructive Pulmonary Disease: Uncontrolled - complicated by possible nasal obstruction. ENT referral in process to investigate. Plans to see Dr. Benjamine Mola in [place] soon.  Patient now has CPAP machine but needs to have it read. Appointment scheduled for 04/01/21.  Stopped smoking 08/23/2017; smoked for 30 years (1 PPD) Current treatment: albuterol metered dose (ProAir, Ventolin, Proventil) 1 puff by mouth as needed for shortness of breath and tiotropium (Spiriva Respimat) 2 inhalations once daily (Receives from [employer]) Patient reports only using albuterol a couple times per week but often sounds out of breath GOLD Classification: unknown CAT score (12/28/20): 23 Most recent Pulmonary Function Testing: N/A 0 exacerbations requiring treatment in the last 6 months  Current oxygen requirements: none Continue Spiriva 2 puffs once daily and albuterol as needed for shortness of breath   Patient was instructed on the appropriate inhaler technique for Spiriva Respimat on 02/08/21  Atrial Fibrillation: Controlled Current rate control: metoprolol tartrate 100 mg by mouth twice daily Most recent ECG: normal  sinus Anticoagulation: rivaroxaban (Xarelto) 20 mg by mouth once daily Denies signs and symptoms of bleeding CHADS2VASc score: 3 (CHF, hypertension, and diabetes) Prior ablation: _0  Yes  _1  No Home blood pressure: 130-140s/80s Home heart rate: unknown Continue metoprolol  Continue rivaroxaban (Xarelto) 20 mg by mouth once daily Encouraged regular physical activity for general health benefits Recommend home blood pressure and heart rate monitoring, to bring results in next visit Discussed need for medication compliance Patient may qualify for patient assistance for Xarelto after he has spent at least 4% of his gross annual income at the pharmacy.  Anxiety/Depression: Improving per patient. Talking to his providers helps him. Has not cried recently which is an improvement and has had less outbursts of anger. Current medication: bupropion XL 300 mg by mouth daily and hydroxyzine 25 mg by mouth every 8 hours  Patient reports that hydroxyzine helps him a lot but makes him drowsy at times.  Patient Goals/Self-Care Activities Over the next 90 days, patient will:  take medications as prescribed check glucose twice daily (fasting and bedtime), document, and provide at future appointments check blood pressure at least once daily, document, and provide at future appointments weigh daily, and contact provider if weight gain of 3 lbs in a day or more than 5 lbs in a week  Follow Up Plan: Face to Face appointment with care management team member scheduled for:  03/29/21      Medication Assistance:  see care plan for more details  Patient's preferred pharmacy is:  [facility] [address], [place] [address] Phone: [phone] Fax: [phone]  [facility] [place], [place] [address] Phone: [phone] Fax: [place], [place]. [place] [address] Phone: [phone] Fax: [phone]  Follow Up:  Patient agrees to Care Plan and Follow-up.  Plan: Face to Face appointment with care management team member scheduled for: 03/29/21  Kennon Holter, PharmD Clinical Pharmacist [facility] [phone]

## 2021-03-16 NOTE — Telephone Encounter (Signed)
This was placed for Dr Benjamine Mola in [place]. Currently there are no [facility] that I am aware of. Please make pt aware

## 2021-03-17 NOTE — Telephone Encounter (Signed)
Put in tray to be faxed

## 2021-03-18 ENCOUNTER — Telehealth: Payer: Self-pay | Admitting: Internal Medicine

## 2021-03-18 NOTE — Telephone Encounter (Signed)
Spoke with Pt via My Chart

## 2021-03-18 NOTE — Telephone Encounter (Signed)
Pt called in about the rollator presccription

## 2021-03-21 ENCOUNTER — Telehealth: Payer: Self-pay | Admitting: Pulmonary Disease

## 2021-03-21 ENCOUNTER — Other Ambulatory Visit: Payer: Self-pay | Admitting: *Deleted

## 2021-03-21 DIAGNOSIS — I5032 Chronic diastolic (congestive) heart failure: Secondary | ICD-10-CM

## 2021-03-21 DIAGNOSIS — J449 Chronic obstructive pulmonary disease, unspecified: Secondary | ICD-10-CM

## 2021-03-21 DIAGNOSIS — E1169 Type 2 diabetes mellitus with other specified complication: Secondary | ICD-10-CM | POA: Diagnosis not present

## 2021-03-21 DIAGNOSIS — I480 Paroxysmal atrial fibrillation: Secondary | ICD-10-CM

## 2021-03-21 DIAGNOSIS — F339 Major depressive disorder, recurrent, unspecified: Secondary | ICD-10-CM | POA: Diagnosis not present

## 2021-03-21 DIAGNOSIS — R5381 Other malaise: Secondary | ICD-10-CM

## 2021-03-21 DIAGNOSIS — Z9181 History of falling: Secondary | ICD-10-CM

## 2021-03-21 DIAGNOSIS — I10 Essential (primary) hypertension: Secondary | ICD-10-CM | POA: Diagnosis not present

## 2021-03-21 MED ORDER — MISC. DEVICES MISC
0 refills | Status: DC
Start: 2021-03-21 — End: 2021-03-21

## 2021-03-21 NOTE — Telephone Encounter (Signed)
I spoke with Colletta Maryland, CMA, she will correct the order for the Rollator walker for the patient, and provide notes

## 2021-03-21 NOTE — Telephone Encounter (Signed)
Order has been corrected and resent to [facility] along with last PCP office notes. Patient was also given a hard copy of order to take with him

## 2021-03-21 NOTE — Telephone Encounter (Signed)
[employer] [phone] and spoke to [place]. She said the most recent DL on the patient ends on 02/18/2021. Asked her to fax that over. She said for a more recent download patient needs to bring his SD card in to them or disconnect the wifi from his machine and reconnect it. She states he cant bring his card into [facility] office because the card wont pull up data into airview. I told her I would call the patient and let him know what she said. She agreed to fax the most recent DL to the [facility] office fax number [phone].   Contacted the patient and let him know that [facility] was contacted and they will send over the DL. Also informed him off [employer] for a more recent download. Patient has been informed that he will be contacted once the DL has been received.

## 2021-03-21 NOTE — Telephone Encounter (Signed)
Pt following up regarding messages sent before on pressures and why he isn't sleeping.  See previous encounter(s).  Please advise   To note: there are previous encounters regarding this issue for the patient.    Dr. Elsworth Soho please advise.

## 2021-03-21 NOTE — Telephone Encounter (Signed)
Reginald [name] from [facility] home [facility] called needs prescription for rollator and office notes to go with the prescription, so insurance will cover.  Call back # [phone] , fax # [phone]

## 2021-03-21 NOTE — Telephone Encounter (Signed)
Reginald [name] called and said orders not signed and did not receive no notes.  [place] in [place]. Prescription was not signed and also needs office notes why use of rollator. MUST SAY FOR ROLLATOR [phone]

## 2021-03-21 NOTE — Telephone Encounter (Signed)
Called and spoke to patient and let him know what Dr. Elsworth Soho stated. Patient is requesting we [facility]  for another DL because he feels strongly that his cpap machine is the problem. Patient is also requesting that we give him an update once the download has been requested by our office and received. Reminded patient of his appt on 11/11 as well.  Patient voiced understanding.

## 2021-03-21 NOTE — Telephone Encounter (Signed)
Refaxed order along with recent office note to [facility]. Patient aware. He is also coming by office today to pick up copy of Order as well

## 2021-03-22 ENCOUNTER — Ambulatory Visit (INDEPENDENT_AMBULATORY_CARE_PROVIDER_SITE_OTHER): Payer: Medicare HMO | Admitting: *Deleted

## 2021-03-22 DIAGNOSIS — I5032 Chronic diastolic (congestive) heart failure: Secondary | ICD-10-CM

## 2021-03-22 DIAGNOSIS — E1169 Type 2 diabetes mellitus with other specified complication: Secondary | ICD-10-CM

## 2021-03-22 DIAGNOSIS — F339 Major depressive disorder, recurrent, unspecified: Secondary | ICD-10-CM

## 2021-03-22 DIAGNOSIS — G4733 Obstructive sleep apnea (adult) (pediatric): Secondary | ICD-10-CM | POA: Diagnosis not present

## 2021-03-22 DIAGNOSIS — F419 Anxiety disorder, unspecified: Secondary | ICD-10-CM

## 2021-03-22 NOTE — Chronic Care Management (AMB) (Signed)
Chronic Care Management    Clinical Social Work Note  03/22/2021 Name: Reginald [name] MRN: [id number] DOB: 1971/01/14  Reginald [name] is a 50 y.o. year old male who is a primary care patient of [name], MD. The CCM team was consulted to assist the patient with chronic disease management and/or care coordination needs related to: Mental Health Counseling and Resources.   Engaged with patient by telephone for follow up visit in response to provider referral for social work chronic care management and care coordination services.   Consent to Services:  The patient was given information about Chronic Care Management services, agreed to services, and gave verbal consent prior to initiation of services.  Please see initial visit note for detailed documentation.   Patient agreed to services and consent obtained.   Assessment: Review of patient past medical history, allergies, medications, and health status, including review of relevant consultants reports was performed today as part of a comprehensive evaluation and provision of chronic care management and care coordination services.     SDOH (Social Determinants of Health) assessments and interventions performed:    Advanced Directives Status: Not addressed in this encounter.  CCM Care Plan  No Known Allergies  Outpatient Encounter Medications as of 03/22/2021  Medication Sig Note   acetaminophen (TYLENOL) 325 MG tablet Take 650 mg by mouth every 6 (six) hours as needed for mild pain.    albuterol (PROVENTIL) (2.5 MG/3ML) 0.083% nebulizer solution INHALE 1 VIAL VIA NEBULIZER EVERY 6 HOURS AS NEEDED FOR WHEEZING OR SHORTNESS OF BREATH    albuterol (VENTOLIN HFA) 108 (90 Base) MCG/ACT inhaler INHALE 2 PUFFS INTO THE LUNGS EVERY 6 (SIX) HOURS AS NEEDED FOR WHEEZING OR SHORTNESS OF BREATH.    allopurinol (ZYLOPRIM) 300 MG tablet TAKE 1 TABLET EVERY DAY    ammonium lactate (AMLACTIN) 12 % lotion Apply 1 application topically as needed for dry  skin.    atorvastatin (LIPITOR) 40 MG tablet TAKE 1 TABLET EVERY DAY    Blood Glucose Monitoring Suppl (TRUE METRIX METER) w/Device KIT USE AS DIRECTED    buPROPion (WELLBUTRIN XL) 300 MG 24 hr tablet Take 1 tablet (300 mg total) by mouth daily.    diclofenac Sodium (VOLTAREN) 1 % GEL Apply 2 g topically 4 (four) times daily.    docusate sodium (COLACE) 100 MG capsule Take 2 capsules (200 mg total) by mouth 2 (two) times daily. 02/21/2021: 2 once a day   empagliflozin (JARDIANCE) 10 MG TABS tablet Take 1 tablet (10 mg total) by mouth daily before breakfast. 03/03/2021: Gets from patient assistance ([employer])   FEROSUL 325 (65 Fe) MG tablet TAKE 1 TABLET EVERY DAY    fexofenadine (ALLEGRA) 180 MG tablet Take 180 mg by mouth daily.    gabapentin (NEURONTIN) 100 MG capsule TAKE 1 CAPSULE IN THE MORNING AND TAKE 2 CAPSULES IN THE EVENING    glipiZIDE (GLUCOTROL XL) 5 MG 24 hr tablet Take 1 tablet (5 mg total) by mouth daily with breakfast.    hydrOXYzine (ATARAX/VISTARIL) 25 MG tablet Take 1 tablet (25 mg total) by mouth every 8 (eight) hours as needed.    Krill Oil 350 MG CAPS Take 1 capsule by mouth in the morning and at bedtime.     lidocaine-prilocaine (EMLA) cream Apply 1 application topically as needed.    lisinopril (ZESTRIL) 20 MG tablet Take 1 tablet (20 mg total) by mouth daily.    metFORMIN (GLUCOPHAGE) 850 MG tablet TAKE 1 TABLET TWICE DAILY WITH  MEALS    metolazone (ZAROXOLYN) 5 MG tablet TAKE 1 TABLET (5 MG TOTAL) BY MOUTH THREE TIMES A WEEK. 03/03/2021: Has only been using ~1 time per week recently to maintain fluid   metoprolol tartrate (LOPRESSOR) 100 MG tablet TAKE 1 TABLET TWICE DAILY    pantoprazole (PROTONIX) 40 MG tablet Take 1 tablet (40 mg total) by mouth daily.    polyethylene glycol (MIRALAX) 17 g packet Take 17 g by mouth daily as needed.    potassium chloride SA (KLOR-CON) 20 MEQ tablet TAKE 3 TABS DAILY AND TAKE AN ADDITIONAL 1 TAB ON THE DAY YOU TAKE YOUR WEEKLY  METOLAZONE DOSE    rivaroxaban (XARELTO) 20 MG TABS tablet TAKE 1 Tablet BY MOUTH ONCE EVERY DAY WITH SUPPER    Semaglutide (RYBELSUS) 14 MG TABS Take 14 mg by mouth in the morning. 03/03/2021: Gets from patient assistance ([employer])   terbinafine (LAMISIL) 250 MG tablet Take 1 tablet by mouth once daily    Tiotropium Bromide Monohydrate (SPIRIVA RESPIMAT) 2.5 MCG/ACT AERS Inhale 2 puffs into the lungs daily. 03/03/2021: Gets from patient assistance ([employer])   torsemide (DEMADEX) 20 MG tablet Take 2 tablets (40 mg total) by mouth 2 (two) times daily.    TRUE METRIX BLOOD GLUCOSE TEST test strip TEST ONE TIME DAILY FOR DIABETES    TRUEplus Lancets 33G MISC USE TO TEST ONE TIME DAILY FOR DIABETES    No facility-administered encounter medications on file as of 03/22/2021.    Patient Active Problem List   Diagnosis Date Noted   Iron deficiency anemia 02/21/2021   Constipation 02/21/2021   Rectal bleeding    Chronic blood loss anemia 11/12/2020   Gastrointestinal hemorrhage 10/08/2020   S/P placement of cardiac pacemaker 02/10/2020   Encounter for examination following treatment at hospital 02/10/2020   Anxiety 02/10/2020   DM2 (diabetes mellitus, type 2) ([place]) 02/10/2020   OSA on CPAP    Abnormal liver function 03/16/2019   History of pulmonary embolism 03/16/2019   Paroxysmal atrial fibrillation ([place]) 09/05/2018   COPD (chronic obstructive pulmonary disease) ([place]) 05/07/2018   Gastroesophageal reflux disease    Pulmonary embolism ([place]) 09/18/2017   Encounter for therapeutic drug monitoring 09/04/2017   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 08/23/2017   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   Pulmonary hypertension ([place])    Chronic periodontitis 07/18/2017   Morbid obesity ([place])    Essential hypertension    Tobacco abuse    Chronic heart failure with preserved ejection fraction (HFpEF) ([place])    Chronic venous  insufficiency     Conditions to be addressed/monitored: Depression; Mental Health Concerns   Care Plan : LCSW PLAN OF CARE  Updates made by Deirdre Peer, LCSW since 03/22/2021 12:00 AM     Problem: Unmanaged depression   Priority: High     Long-Range Goal: Reduce symptoms/feelings related to depression   Start Date: 12/21/2020  Expected End Date: 05/20/2021  This Visit's Progress: On track  Recent Progress: On track  Priority: High  Note:   Current Barriers:  Chronic Mental Health needs related to depression Mental Health Concerns  Suicidal Ideation/Homicidal Ideation: No  Clinical Social Work Goal(s):  patient will work with SW  PRN  by telephone or in person to reduce or manage symptoms related to depression  patient will work with SW to address concerns related to depression and treatment options patient will work with PCP and Pharmacist to address needs related  to medication management/adjustment of current RX depression regimen to better treat symptoms patient will demonstrate improved health management independence as evidenced by reduction  of PHQ9 score  Interventions: 03/22/21- CSW has confirmed an appointment for pt at [facility] in [place]. Pt is aware and will have an initial intake with Maye Hides on 03/30/21 at 2pm.    03/07/21- Pt reports [facility] was only able to link him virtual counseling and he wants in personl. CSW validated the in-person preference and will seek options for him.    02/04/21: Pt reports feeling better today. He is tired and feels his CPAP may be causing him to not sleep well. He plans to discuss further with provider.  CSW discussed his mental health with him- he is now willing to consider counseling and agrees to referral being made to [facility]. CSW advised pt to expect a call from [facility] to schedule him.   02/01/21: CSW received call from pt today who reports having a lot of side effects from  medications (recently changed/adjusted). Per pt, "I am crying more and over nothing". He also shared that he called the 13 hotline for mental health crisis and was told the line was for people suicidal. Pt states, "I'm not suicidal and would not do that because I love myself too much... and my pets...." he also states that his "faith prevents him from doing that".  Pt has a lot of anxiety today around his medications and reports being told to do different things by the different providers. CSW advised pt he would need to get guidance from clinical staff (PCP, [place], RN) and CSW will alert the team of above.  CSW did remind pt to call CSW anytime and to alwaysnot hesitate to call 911 if emergency arises.    01/14/21: CSW spoke with pt who admits to history of depression. Pt denies any current or past SI/HI, hospitalizations. Pt is currently on Buspar and Celexa for his depression; his PHQ9 score today was 20- CSW discussed with pt the significance/severity of his score and discussed options. Pt declines counseling support. He also declines referral to Psychiatry indicating he use to see "Dr Truman Hayward" who prescribed his meds but now his PCP does. Pt does not wish to be referred back to "Dr Truman Hayward" nor any other Psychiatrist. Pt also shares that the Buspar is 3 x daily- would like to consider other options that may be better coverage of his symptoms and needs; and less pills- "have to remember to take it".  CSW discussed HCPOA with pt; he does not have anyone appointed and states he does not have a relationship with his family. Encouraged pt to consider completion of Advance Directives- will mail pt packet to review.  Patient interviewed and appropriate assessments performed: PHQ 9 1:1 collaboration with [name], MD regarding development and update of comprehensive plan of care as evidenced by provider attestation and co-signature Patient interviewed and appropriate assessments performed Provided mental health  counseling with regard to depression; medications, counseling options  Discussed plans with patient for ongoing care management follow up and provided patient with direct contact information for care management team Provided education and assistance to client regarding Advanced Directives. Depression screen reviewed , PHQ2/ PHQ9 completed, Solution-Focused Strategies, Active listening / Reflection utilized , Psychoeducation for mental health needs , Reviewed mental health medications with patient and discussed compliance: discussed options for counseling and/or RX adjustments being made, Participation in counseling encouraged , Verbalization of feelings encouraged ,  Suicidal Ideation/Homicidal Ideation assessed:, Discussed [place] , Discussed referral to [facility] to assist with connecting to mental health provider, and pt declines counseling at this time  Patient Self Care Activities:  Self administers medications as prescribed Attends all scheduled provider appointments Performs ADL's independently Performs IADL's independently Independent living  Patient Coping Strengths:  Hopefulness Self Advocate Able to Communicate Effectively  Patient Self Care Deficits:  Lacks social connections Family not involved/connected  Patient Goals:  -attend counseling appointment on Wednesday, 03/30/21 at 2pm with Maye Hides  ([address])  -call 911 for emergency or [phone] for mental health support/crisis  -discuss medication adjustment/options with PCP and Pharmacist at PCP office - avoid negative self-talk - develop a personal safety plan - develop a plan to deal with triggers like holidays, anniversaries - have a plan for how to handle bad days - spend time or talk with others at least 2 to 3 times per week - spend time or talk with others every day - watch for early signs of feeling worse   Follow Up Plan: Appointment scheduled for SW follow up with  client by phone on: 04/04/21      Follow Up Plan: Appointment scheduled for SW follow up with client by phone on: 04/04/21      Eduard Clos MSW, LCSW Licensed Clinical Social Education officer, environmental Primary Care [phone]

## 2021-03-22 NOTE — Telephone Encounter (Signed)
Called patient and let him know we [facility]. PT has requested it be sent to Dr. Elsworth Soho again. Will fax over to [facility] office for Dr. Elsworth Soho and leave a copy in [facility] office. Did notify patient that the DL received is the most recent report ending on 9/30 of this year and the previous DL was sent to Dr. Elsworth Soho was from middle of September and was already addressed by Dr. Elsworth Soho. Patient voiced understanding is requesting  the DL to be sent to Dr. Elsworth Soho again.  Will fax to [facility] office   Dr. Elsworth Soho routing this conversation to you so that you are aware.  Patient has an appt in [facility] office on 04/01/21

## 2021-03-22 NOTE — Patient Instructions (Signed)
Visit Information  Patient verbalizes understanding of instructions provided today and agrees to view in [place].   Telephone follow up appointment with care management team member scheduled for:04/04/21  Eduard Clos MSW, LCSW Licensed Clinical Social Education officer, environmental Primary Care [phone]

## 2021-03-24 ENCOUNTER — Emergency Department (HOSPITAL_COMMUNITY): Payer: Medicare HMO

## 2021-03-24 ENCOUNTER — Encounter (HOSPITAL_COMMUNITY): Payer: Self-pay | Admitting: Emergency Medicine

## 2021-03-24 ENCOUNTER — Inpatient Hospital Stay (HOSPITAL_COMMUNITY)
Admission: EM | Admit: 2021-03-24 | Discharge: 2021-03-25 | DRG: 871 | Discharge status: Against Medical Advice | Payer: Medicare HMO | Attending: Internal Medicine | Admitting: Internal Medicine

## 2021-03-24 ENCOUNTER — Other Ambulatory Visit: Payer: Self-pay

## 2021-03-24 DIAGNOSIS — G4733 Obstructive sleep apnea (adult) (pediatric): Secondary | ICD-10-CM | POA: Diagnosis present

## 2021-03-24 DIAGNOSIS — I700 Atherosclerosis of aorta: Secondary | ICD-10-CM | POA: Diagnosis not present

## 2021-03-24 DIAGNOSIS — I959 Hypotension, unspecified: Secondary | ICD-10-CM | POA: Diagnosis not present

## 2021-03-24 DIAGNOSIS — I110 Hypertensive heart disease with heart failure: Secondary | ICD-10-CM | POA: Diagnosis not present

## 2021-03-24 DIAGNOSIS — Z8249 Family history of ischemic heart disease and other diseases of the circulatory system: Secondary | ICD-10-CM

## 2021-03-24 DIAGNOSIS — E1140 Type 2 diabetes mellitus with diabetic neuropathy, unspecified: Secondary | ICD-10-CM | POA: Diagnosis not present

## 2021-03-24 DIAGNOSIS — I480 Paroxysmal atrial fibrillation: Secondary | ICD-10-CM | POA: Diagnosis present

## 2021-03-24 DIAGNOSIS — K760 Fatty (change of) liver, not elsewhere classified: Secondary | ICD-10-CM | POA: Diagnosis not present

## 2021-03-24 DIAGNOSIS — F32A Depression, unspecified: Secondary | ICD-10-CM | POA: Diagnosis present

## 2021-03-24 DIAGNOSIS — Z9181 History of falling: Secondary | ICD-10-CM | POA: Diagnosis not present

## 2021-03-24 DIAGNOSIS — K8020 Calculus of gallbladder without cholecystitis without obstruction: Secondary | ICD-10-CM | POA: Diagnosis not present

## 2021-03-24 DIAGNOSIS — E782 Mixed hyperlipidemia: Secondary | ICD-10-CM | POA: Diagnosis present

## 2021-03-24 DIAGNOSIS — I5033 Acute on chronic diastolic (congestive) heart failure: Secondary | ICD-10-CM | POA: Diagnosis not present

## 2021-03-24 DIAGNOSIS — Z950 Presence of cardiac pacemaker: Secondary | ICD-10-CM

## 2021-03-24 DIAGNOSIS — R9431 Abnormal electrocardiogram [ECG] [EKG]: Secondary | ICD-10-CM

## 2021-03-24 DIAGNOSIS — N3001 Acute cystitis with hematuria: Secondary | ICD-10-CM

## 2021-03-24 DIAGNOSIS — F419 Anxiety disorder, unspecified: Secondary | ICD-10-CM | POA: Diagnosis present

## 2021-03-24 DIAGNOSIS — I517 Cardiomegaly: Secondary | ICD-10-CM | POA: Diagnosis not present

## 2021-03-24 DIAGNOSIS — M109 Gout, unspecified: Secondary | ICD-10-CM | POA: Diagnosis present

## 2021-03-24 DIAGNOSIS — Z6841 Body Mass Index (BMI) 40.0 and over, adult: Secondary | ICD-10-CM

## 2021-03-24 DIAGNOSIS — I5032 Chronic diastolic (congestive) heart failure: Secondary | ICD-10-CM | POA: Diagnosis present

## 2021-03-24 DIAGNOSIS — R262 Difficulty in walking, not elsewhere classified: Secondary | ICD-10-CM | POA: Diagnosis not present

## 2021-03-24 DIAGNOSIS — R6520 Severe sepsis without septic shock: Secondary | ICD-10-CM | POA: Diagnosis present

## 2021-03-24 DIAGNOSIS — M1990 Unspecified osteoarthritis, unspecified site: Secondary | ICD-10-CM | POA: Diagnosis present

## 2021-03-24 DIAGNOSIS — R2689 Other abnormalities of gait and mobility: Secondary | ICD-10-CM | POA: Diagnosis not present

## 2021-03-24 DIAGNOSIS — I10 Essential (primary) hypertension: Secondary | ICD-10-CM | POA: Diagnosis present

## 2021-03-24 DIAGNOSIS — K219 Gastro-esophageal reflux disease without esophagitis: Secondary | ICD-10-CM | POA: Diagnosis not present

## 2021-03-24 DIAGNOSIS — Z820 Family history of epilepsy and other diseases of the nervous system: Secondary | ICD-10-CM | POA: Diagnosis not present

## 2021-03-24 DIAGNOSIS — D509 Iron deficiency anemia, unspecified: Secondary | ICD-10-CM | POA: Diagnosis present

## 2021-03-24 DIAGNOSIS — Z7984 Long term (current) use of oral hypoglycemic drugs: Secondary | ICD-10-CM

## 2021-03-24 DIAGNOSIS — I272 Pulmonary hypertension, unspecified: Secondary | ICD-10-CM | POA: Diagnosis not present

## 2021-03-24 DIAGNOSIS — Z86711 Personal history of pulmonary embolism: Secondary | ICD-10-CM

## 2021-03-24 DIAGNOSIS — D72829 Elevated white blood cell count, unspecified: Secondary | ICD-10-CM

## 2021-03-24 DIAGNOSIS — Z79899 Other long term (current) drug therapy: Secondary | ICD-10-CM

## 2021-03-24 DIAGNOSIS — Z20822 Contact with and (suspected) exposure to covid-19: Secondary | ICD-10-CM | POA: Diagnosis not present

## 2021-03-24 DIAGNOSIS — Z953 Presence of xenogenic heart valve: Secondary | ICD-10-CM | POA: Diagnosis not present

## 2021-03-24 DIAGNOSIS — R0609 Other forms of dyspnea: Secondary | ICD-10-CM | POA: Diagnosis present

## 2021-03-24 DIAGNOSIS — N390 Urinary tract infection, site not specified: Secondary | ICD-10-CM

## 2021-03-24 DIAGNOSIS — E872 Acidosis, unspecified: Secondary | ICD-10-CM | POA: Diagnosis not present

## 2021-03-24 DIAGNOSIS — Z87891 Personal history of nicotine dependence: Secondary | ICD-10-CM

## 2021-03-24 DIAGNOSIS — E1165 Type 2 diabetes mellitus with hyperglycemia: Secondary | ICD-10-CM | POA: Diagnosis not present

## 2021-03-24 DIAGNOSIS — R5381 Other malaise: Secondary | ICD-10-CM | POA: Diagnosis not present

## 2021-03-24 DIAGNOSIS — I350 Nonrheumatic aortic (valve) stenosis: Secondary | ICD-10-CM | POA: Diagnosis present

## 2021-03-24 DIAGNOSIS — A419 Sepsis, unspecified organism: Secondary | ICD-10-CM | POA: Diagnosis not present

## 2021-03-24 DIAGNOSIS — J449 Chronic obstructive pulmonary disease, unspecified: Secondary | ICD-10-CM | POA: Diagnosis not present

## 2021-03-24 DIAGNOSIS — I482 Chronic atrial fibrillation, unspecified: Secondary | ICD-10-CM

## 2021-03-24 DIAGNOSIS — Z9989 Dependence on other enabling machines and devices: Secondary | ICD-10-CM

## 2021-03-24 DIAGNOSIS — Z5329 Procedure and treatment not carried out because of patient's decision for other reasons: Secondary | ICD-10-CM | POA: Diagnosis not present

## 2021-03-24 DIAGNOSIS — Z7901 Long term (current) use of anticoagulants: Secondary | ICD-10-CM

## 2021-03-24 DIAGNOSIS — R319 Hematuria, unspecified: Secondary | ICD-10-CM

## 2021-03-24 DIAGNOSIS — Z833 Family history of diabetes mellitus: Secondary | ICD-10-CM

## 2021-03-24 LAB — BASIC METABOLIC PANEL
Anion gap: 12 (ref 5–15)
BUN: 27 mg/dL — ABNORMAL HIGH (ref 6–20)
CO2: 31 mmol/L (ref 22–32)
Calcium: 9 mg/dL (ref 8.9–10.3)
Chloride: 91 mmol/L — ABNORMAL LOW (ref 98–111)
Creatinine, Ser: 1.8 mg/dL — ABNORMAL HIGH (ref 0.61–1.24)
GFR, Estimated: 46 mL/min — ABNORMAL LOW (ref >60)
Glucose, Bld: 132 mg/dL — ABNORMAL HIGH (ref 70–99)
Potassium: 4.2 mmol/L (ref 3.5–5.1)
Sodium: 134 mmol/L — ABNORMAL LOW (ref 135–145)

## 2021-03-24 LAB — CBG MONITORING, ED: Glucose-Capillary: 148 mg/dL — ABNORMAL HIGH (ref 70–99)

## 2021-03-24 LAB — CBC WITH DIFFERENTIAL/PLATELET
Abs Immature Granulocytes: 0.13 10*3/uL — ABNORMAL HIGH (ref 0.00–0.07)
Basophils Absolute: 0.1 10*3/uL (ref 0.0–0.1)
Basophils Relative: 0 %
Eosinophils Absolute: 0.2 10*3/uL (ref 0.0–0.5)
Eosinophils Relative: 1 %
HCT: 41 % (ref 39.0–52.0)
Hemoglobin: 12.6 g/dL — ABNORMAL LOW (ref 13.0–17.0)
Immature Granulocytes: 1 %
Lymphocytes Relative: 10 %
Lymphs Abs: 2.1 10*3/uL (ref 0.7–4.0)
MCH: 25.6 pg — ABNORMAL LOW (ref 26.0–34.0)
MCHC: 30.7 g/dL (ref 30.0–36.0)
MCV: 83.3 fL (ref 80.0–100.0)
Monocytes Absolute: 1.2 10*3/uL — ABNORMAL HIGH (ref 0.1–1.0)
Monocytes Relative: 6 %
Neutro Abs: 17 10*3/uL — ABNORMAL HIGH (ref 1.7–7.7)
Neutrophils Relative %: 82 %
Platelets: 310 10*3/uL (ref 150–400)
RBC: 4.92 MIL/uL (ref 4.22–5.81)
RDW: 18.9 % — ABNORMAL HIGH (ref 11.5–15.5)
WBC: 20.7 10*3/uL — ABNORMAL HIGH (ref 4.0–10.5)
nRBC: 0 % (ref 0.0–0.2)

## 2021-03-24 LAB — URINALYSIS, ROUTINE W REFLEX MICROSCOPIC
Bilirubin Urine: NEGATIVE
Glucose, UA: 50 mg/dL — AB
Ketones, ur: NEGATIVE mg/dL
Nitrite: NEGATIVE
Protein, ur: >=300 mg/dL — AB
RBC / HPF: >50 RBC/hpf — ABNORMAL HIGH (ref 0–5)
Specific Gravity, Urine: 1.018 (ref 1.005–1.030)
pH: 7 (ref 5.0–8.0)

## 2021-03-24 LAB — RESP PANEL BY RT-PCR (FLU A&B, COVID) ARPGX2
Influenza A by PCR: NEGATIVE
Influenza B by PCR: NEGATIVE
SARS Coronavirus 2 by RT PCR: NEGATIVE

## 2021-03-24 LAB — LACTIC ACID, PLASMA
Lactic Acid, Venous: 3.3 mmol/L (ref 0.5–1.9)
Lactic Acid, Venous: 1.9 mmol/L (ref 0.5–1.9)

## 2021-03-24 LAB — GLUCOSE, CAPILLARY: Glucose-Capillary: 195 mg/dL — ABNORMAL HIGH (ref 70–99)

## 2021-03-24 MED ORDER — ALBUTEROL SULFATE HFA 108 (90 BASE) MCG/ACT IN AERS: 2.0000 | INHALATION_SPRAY | Freq: Four times a day (QID) | RESPIRATORY_TRACT | Status: DC | PRN

## 2021-03-24 MED ORDER — FERROUS SULFATE 325 (65 FE) MG PO TABS
325.0000 mg | ORAL_TABLET | Freq: Every day | ORAL | Status: DC
  Administered 2021-03-25: 325 mg via ORAL
  Filled 2021-03-24: qty 1

## 2021-03-24 MED ORDER — INSULIN ASPART 100 UNIT/ML IJ SOLN: 0.0000 [IU] | Freq: Every day | INTRAMUSCULAR | Status: DC

## 2021-03-24 MED ORDER — SODIUM CHLORIDE 0.9 % IV BOLUS
1000.0000 mL | Freq: Once | INTRAVENOUS | Status: AC
  Administered 2021-03-24: 1000 mL via INTRAVENOUS

## 2021-03-24 MED ORDER — ATORVASTATIN CALCIUM 40 MG PO TABS
40.0000 mg | ORAL_TABLET | Freq: Every day | ORAL | Status: DC
  Administered 2021-03-25: 40 mg via ORAL
  Filled 2021-03-24: qty 1

## 2021-03-24 MED ORDER — ACETAMINOPHEN 325 MG PO TABS: 650.0000 mg | ORAL_TABLET | Freq: Four times a day (QID) | ORAL | Status: DC | PRN

## 2021-03-24 MED ORDER — ALBUTEROL SULFATE (2.5 MG/3ML) 0.083% IN NEBU: 2.5000 mg | INHALATION_SOLUTION | Freq: Four times a day (QID) | RESPIRATORY_TRACT | Status: DC | PRN

## 2021-03-24 MED ORDER — INSULIN ASPART 100 UNIT/ML IJ SOLN
0.0000 [IU] | Freq: Three times a day (TID) | INTRAMUSCULAR | Status: DC
  Administered 2021-03-25 (×2): 3 [IU] via SUBCUTANEOUS

## 2021-03-24 MED ORDER — TIOTROPIUM BROMIDE MONOHYDRATE 2.5 MCG/ACT IN AERS: 2.0000 | INHALATION_SPRAY | Freq: Every day | RESPIRATORY_TRACT | Status: DC

## 2021-03-24 MED ORDER — SODIUM CHLORIDE 0.9 % IV SOLN
1.0000 g | INTRAVENOUS | Status: DC
  Administered 2021-03-25: 1 g via INTRAVENOUS
  Filled 2021-03-24: qty 10

## 2021-03-24 MED ORDER — PANTOPRAZOLE SODIUM 40 MG PO TBEC
40.0000 mg | DELAYED_RELEASE_TABLET | Freq: Every day | ORAL | Status: DC
  Administered 2021-03-25: 40 mg via ORAL
  Filled 2021-03-24: qty 1

## 2021-03-24 MED ORDER — SODIUM CHLORIDE 0.9 % IV SOLN: Freq: Once | INTRAVENOUS | Status: AC

## 2021-03-24 MED ORDER — UMECLIDINIUM BROMIDE 62.5 MCG/ACT IN AEPB
1.0000 | INHALATION_SPRAY | Freq: Every day | RESPIRATORY_TRACT | Status: DC
  Administered 2021-03-25: 1 via RESPIRATORY_TRACT
  Filled 2021-03-24: qty 7

## 2021-03-24 MED ORDER — SODIUM CHLORIDE 0.9 % IV SOLN
1.0000 g | Freq: Once | INTRAVENOUS | Status: AC
  Administered 2021-03-24: 1 g via INTRAVENOUS
  Filled 2021-03-24: qty 10

## 2021-03-24 NOTE — ED Notes (Signed)
Bladder scan showed 499+ mls. Nurse and DR notified.

## 2021-03-24 NOTE — ED Provider Notes (Signed)
[facility] Provider Note   CSN: [id number] Arrival date & time: 03/24/21  1328     History Chief Complaint  Patient presents with   Hematuria    Reginald [name] is a 50 y.o. male.  HPI  Patient with significant medical history including diabetes, A. fib currently on Xarelto, heart failure, on torsemide, obesity, hypertension, sleep apnea PE presents to the emergency department with chief complaint of penile pain.  Patient states this morning he woke up and noted there was blood in his urine and started having pain in his penis.  He states the pain is only when he urinates, pain does not radiate, states that it is sharp-like in nature, states he noted his urine was was red in nature,  never had this in the past.  He denies stomach pain, suprapubic pain, flank pain, denies foreign bodies had episodes of nausea, he has no complaints at this time.  He has no significant abdominal history, no history of kidney stones, he denies  alleviating or aggravating factors.  He has had no fevers, chills, chest pain, shortness breath, worsening peripheral edema.  Past Medical History:  Diagnosis Date   Anxiety    Aortic stenosis    Arthritis    Asthma    Back pain    Cellulitis of skin with lymphangitis    CHF (congestive heart failure) (HCC)    Chronic diastolic congestive heart failure (HCC)    Chronic venous insufficiency    Constipation    COPD (chronic obstructive pulmonary disease) (HCC)    Depression    Diabetes ([place])    Dyspnea    Essential hypertension    Gout    Heart murmur    Hypertension    Morbid obesity ([place])    Obesity    Pneumonia    walking pneumonia   Prostatitis    Pulmonary embolism ([place])    Pulmonary hypertension (HCC)    Pyelonephritis    S/P aortic valve replacement with bioprosthetic valve 08/23/2017   25 mm Edwards Inspiris Resilia stented bovine pericardial tissue valve   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   24 mm Hemashield  supracoronary straight graft    Sleep apnea    cpap    Thoracic ascending aortic aneurysm    Thoracic ascending aortic aneurysm    Tobacco abuse     Patient Active Problem List   Diagnosis Date Noted   UTI (urinary tract infection) 03/24/2021   Lactic acidosis 03/24/2021   Leukocytosis 03/24/2021   Hyperglycemia due to diabetes mellitus ([place]) 03/24/2021   Mixed hyperlipidemia 03/24/2021   Hematuria 03/24/2021   Atrial fibrillation, chronic ([place]) 03/24/2021   Iron deficiency anemia 02/21/2021   Constipation 02/21/2021   Rectal bleeding    Chronic blood loss anemia 11/12/2020   Gastrointestinal hemorrhage 10/08/2020   S/P placement of cardiac pacemaker 02/10/2020   Encounter for examination following treatment at hospital 02/10/2020   Anxiety 02/10/2020   DM2 (diabetes mellitus, type 2) ([place]) 02/10/2020   OSA on CPAP    Abnormal liver function 03/16/2019   History of pulmonary embolism 03/16/2019   Paroxysmal atrial fibrillation ([place]) 09/05/2018   COPD (chronic obstructive pulmonary disease) ([place]) 05/07/2018   Gastroesophageal reflux disease    Pulmonary embolism ([place]) 09/18/2017   Encounter for therapeutic drug monitoring 09/04/2017   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 08/23/2017   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   Pulmonary hypertension ([place])  Chronic periodontitis 07/18/2017   Morbid obesity ([place])    Essential hypertension    Tobacco abuse    Chronic heart failure with preserved ejection fraction (HFpEF) ([place])    Chronic venous insufficiency     Past Surgical History:  Procedure Laterality Date   AORTIC VALVE REPLACEMENT N/A 08/23/2017   Procedure: AORTIC VALVE REPLACEMENT (AVR) USING INSPIRIS RESILIA AORTIC VALVE SIZE 25 MM;  Surgeon: Rexene Alberts, MD;  Location: [place];  Service: Open Heart Surgery;  Laterality: N/A;   CARDIAC VALVE REPLACEMENT N/A    Phreesia 02/07/2020   COLONOSCOPY  WITH PROPOFOL N/A 11/14/2020   Procedure: COLONOSCOPY WITH PROPOFOL;  Surgeon: Gatha Mayer, MD;  Location: [facility] ENDOSCOPY;  Service: Endoscopy;  Laterality: N/A;   ESOPHAGOGASTRODUODENOSCOPY (EGD) WITH PROPOFOL N/A 11/14/2020   Procedure: ESOPHAGOGASTRODUODENOSCOPY (EGD) WITH PROPOFOL;  Surgeon: Gatha Mayer, MD;  Location: [place];  Service: Endoscopy;  Laterality: N/A;   MULTIPLE EXTRACTIONS WITH ALVEOLOPLASTY N/A 07/23/2017   Procedure: Extraction of tooth #10 with alveoloplasty and gross debridement of remaining teeth;  Surgeon: Lenn Cal, DDS;  Location: [place];  Service: Oral Surgery;  Laterality: N/A;   PACEMAKER IMPLANT N/A 08/28/2017    St Jude Medical Assurity MRI conditional  dual-chamber pacemaker for symptomatic complete heart blockby Dr Rayann Heman   TEE WITHOUT CARDIOVERSION N/A 07/16/2017   Procedure: TRANSESOPHAGEAL ECHOCARDIOGRAM (TEE);  Surgeon: Lelon Perla, MD;  Location: [facility] ENDOSCOPY;  Service: Cardiovascular;  Laterality: N/A;   TEE WITHOUT CARDIOVERSION N/A 08/23/2017   Procedure: TRANSESOPHAGEAL ECHOCARDIOGRAM (TEE);  Surgeon: Rexene Alberts, MD;  Location: [place];  Service: Open Heart Surgery;  Laterality: N/A;   THORACIC AORTIC ANEURYSM REPAIR N/A 08/23/2017   Procedure: THORACIC ASCENDING ANEURYSM REPAIR (AAA) USING HEMASHIELD GOLD KNITTED MICROVEL DOUBLE VELOUR VASCULAR GRAFT D: 24 MM  L: 30 CM;  Surgeon: Rexene Alberts, MD;  Location: [place];  Service: Open Heart Surgery;  Laterality: N/A;       Family History  Problem Relation Age of Onset   Hypertension Mother    Alzheimer's disease Mother    Heart attack Mother    Heart attack Brother    Diabetes Brother     Social History   Tobacco Use   Smoking status: Former    Packs/day: 2.00    Years: 16.00    Pack years: 32.00    Types: Cigarettes    Quit date: 08/23/2017    Years since quitting: 3.5   Smokeless tobacco: Never  Vaping Use   Vaping Use: Never used  Substance Use Topics    Alcohol use: No    Comment: have had alcohol in the past, not heavy   Drug use: No    Home Medications Prior to Admission medications   Medication Sig Start Date End Date Taking? Authorizing Provider  acetaminophen (TYLENOL) 325 MG tablet Take 650 mg by mouth every 6 (six) hours as needed for mild pain.   Yes [provider]  albuterol (PROVENTIL) (2.5 MG/3ML) 0.083% nebulizer solution INHALE 1 VIAL VIA NEBULIZER EVERY 6 HOURS AS NEEDED FOR WHEEZING OR SHORTNESS OF BREATH 02/01/21  Yes [name], MD  allopurinol (ZYLOPRIM) 300 MG tablet TAKE 1 TABLET EVERY DAY 11/11/20  Yes [name], MD  ammonium lactate (AMLACTIN) 12 % lotion Apply 1 application topically as needed for dry skin. 10/13/20  Yes Felipa Furnace, DPM  buPROPion (WELLBUTRIN XL) 300 MG 24 hr tablet Take 1 tablet (300 mg total) by mouth  daily. 02/17/21  Yes [name], MD  docusate sodium (COLACE) 100 MG capsule Take 2 capsules (200 mg total) by mouth 2 (two) times daily. 11/15/20  Yes Thurnell Lose, MD  empagliflozin (JARDIANCE) 10 MG TABS tablet Take 1 tablet (10 mg total) by mouth daily before breakfast. 01/12/21  Yes Strader, Fransisco Hertz, PA-C  FEROSUL 325 (65 Fe) MG tablet TAKE 1 TABLET EVERY DAY 12/23/20  Yes [name], MD  fexofenadine (ALLEGRA) 180 MG tablet Take 180 mg by mouth daily.   Yes [provider]  gabapentin (NEURONTIN) 100 MG capsule TAKE 1 CAPSULE IN THE MORNING AND TAKE 2 CAPSULES IN THE EVENING 03/09/21  Yes [name], MD  glipiZIDE (GLUCOTROL XL) 5 MG 24 hr tablet Take 1 tablet (5 mg total) by mouth daily with breakfast. 03/16/21  Yes [name], MD  hydrOXYzine (ATARAX/VISTARIL) 25 MG tablet Take 1 tablet (25 mg total) by mouth every 8 (eight) hours as needed. 02/03/21  Yes [name], MD  Krill Oil 350 MG CAPS Take 1 capsule by mouth in the morning and at bedtime.    Yes [provider]  lisinopril (ZESTRIL) 20 MG tablet Take 1 tablet (20 mg total)  by mouth daily. 02/09/21 05/10/21 Yes Strader, Fransisco Hertz, PA-C  metFORMIN (GLUCOPHAGE) 850 MG tablet TAKE 1 TABLET TWICE DAILY WITH MEALS 03/11/21  Yes [name], MD  metolazone (ZAROXOLYN) 5 MG tablet TAKE 1 TABLET (5 MG TOTAL) BY MOUTH THREE TIMES A WEEK. 01/26/21  Yes Allred, Jeneen Rinks, MD  metoprolol tartrate (LOPRESSOR) 100 MG tablet TAKE 1 TABLET TWICE DAILY 12/27/20  Yes Josue Hector, MD  pantoprazole (PROTONIX) 40 MG tablet Take 1 tablet (40 mg total) by mouth daily. 07/14/20  Yes [name], MD  polyethylene glycol (MIRALAX) 17 g packet Take 17 g by mouth daily as needed. 11/15/20  Yes Thurnell Lose, MD  potassium chloride SA (KLOR-CON) 20 MEQ tablet TAKE 3 TABS DAILY AND TAKE AN ADDITIONAL 1 TAB ON THE DAY YOU TAKE YOUR WEEKLY METOLAZONE DOSE 12/22/20  Yes Strader, [place] M, PA-C  rivaroxaban (XARELTO) 20 MG TABS tablet TAKE 1 Tablet BY MOUTH ONCE EVERY DAY WITH SUPPER 10/20/20  Yes Josue Hector, MD  Semaglutide (RYBELSUS) 14 MG TABS Take 14 mg by mouth in the morning. 02/08/21  Yes [name], MD  Tiotropium Bromide Monohydrate (SPIRIVA RESPIMAT) 2.5 MCG/ACT AERS Inhale 2 puffs into the lungs daily. 02/03/21  Yes [name], MD  torsemide (DEMADEX) 20 MG tablet Take 2 tablets (40 mg total) by mouth 2 (two) times daily. 02/04/21  Yes Strader, Fransisco Hertz, PA-C  albuterol (VENTOLIN HFA) 108 (90 Base) MCG/ACT inhaler INHALE 2 PUFFS INTO THE LUNGS EVERY 6 (SIX) HOURS AS NEEDED FOR WHEEZING OR SHORTNESS OF BREATH. 12/10/20   [name], MD  atorvastatin (LIPITOR) 40 MG tablet TAKE 1 TABLET EVERY DAY 02/14/21   [name], MD  Blood Glucose Monitoring Suppl (TRUE METRIX METER) w/Device KIT USE AS DIRECTED 08/19/20   [name], MD  diclofenac Sodium (VOLTAREN) 1 % GEL Apply 2 g topically 4 (four) times daily. 10/26/20   Mordecai Rasmussen, MD  lidocaine-prilocaine (EMLA) cream Apply 1 application topically as needed. 02/18/21   Chase Picket, MD  terbinafine (LAMISIL) 250  MG tablet Take 1 tablet by mouth once daily 01/25/21   [name], MD  TRUE METRIX BLOOD GLUCOSE TEST test strip TEST ONE TIME  DAILY FOR DIABETES 10/25/20   [name], MD  TRUEplus Lancets 33G MISC USE TO TEST ONE TIME DAILY FOR DIABETES 10/25/20   [name], MD    Allergies    Patient has no known allergies.  Review of Systems   Review of Systems  Constitutional:  Negative for chills and fever.  HENT:  Negative for congestion.   Respiratory:  Negative for shortness of breath.   Cardiovascular:  Negative for chest pain.  Gastrointestinal:  Negative for abdominal pain, diarrhea, nausea and vomiting.  Genitourinary:  Positive for decreased urine volume, hematuria and penile pain. Negative for difficulty urinating, enuresis, penile swelling, scrotal swelling and testicular pain.  Musculoskeletal:  Negative for back pain.  Skin:  Negative for rash.  Neurological:  Negative for dizziness.  Hematological:  Does not bruise/bleed easily.   Physical Exam Updated Vital Signs BP 101/65   Pulse 88   Temp 99.7 F (37.6 C) (Oral)   Resp (!) 22   SpO2 99%   Physical Exam Vitals and nursing note reviewed. Exam conducted with a chaperone present.  Constitutional:      General: He is not in acute distress.    Appearance: He is obese. He is not ill-appearing.  HENT:     Head: Normocephalic and atraumatic.     Nose: No congestion.     Mouth/Throat:     Mouth: Mucous membranes are moist.     Pharynx: Oropharynx is clear.  Eyes:     Conjunctiva/sclera: Conjunctivae normal.  Cardiovascular:     Rate and Rhythm: Normal rate and regular rhythm.     Pulses: Normal pulses.     Heart sounds: No murmur heard.   No friction rub. No gallop.  Pulmonary:     Effort: No respiratory distress.     Breath sounds: No wheezing, rhonchi or rales.  Abdominal:     Palpations: Abdomen is soft.     Tenderness: There is no abdominal tenderness. There is no right CVA tenderness or left CVA  tenderness.     Comments: Abdomen nondistended, normal bowel sounds, dull to percussion, nontender to palpation, no guarding, rebound tenderness, peritoneal sign, he had noted left-sided flank tenderness, no CVA tenderness noted.  Genitourinary:    Comments: With chaperone present penile exam was performed, patient has a uncircumcised penis, there is no penile discharge or drainage present, testicles were symmetrical bilaterally, they are nontender to palpation, no other gross deformities present. Musculoskeletal:     Right lower leg: No edema.     Left lower leg: No edema.  Skin:    General: Skin is warm and dry.  Neurological:     Mental Status: He is alert.  Psychiatric:        Mood and Affect: Mood normal.    ED Results / Procedures / Treatments   Labs (all labs ordered are listed, but only abnormal results are displayed) Labs Reviewed  BASIC METABOLIC PANEL - Abnormal; Notable for the following components:      Result Value   Sodium 134 (*)    Chloride 91 (*)    Glucose, Bld 132 (*)    BUN 27 (*)    Creatinine, Ser 1.80 (*)    GFR, Estimated 46 (*)    All other components within normal limits  CBC WITH DIFFERENTIAL/PLATELET - Abnormal; Notable for the following components:   WBC 20.7 (*)    Hemoglobin 12.6 (*)    MCH 25.6 (*)    RDW 18.9 (*)  Neutro Abs 17.0 (*)    Monocytes Absolute 1.2 (*)    Abs Immature Granulocytes 0.13 (*)    All other components within normal limits  URINALYSIS, ROUTINE W REFLEX MICROSCOPIC - Abnormal; Notable for the following components:   Color, Urine AMBER (*)    APPearance CLOUDY (*)    Glucose, UA 50 (*)    Hgb urine dipstick LARGE (*)    Protein, ur >=300 (*)    Leukocytes,Ua MODERATE (*)    RBC / HPF >50 (*)    Bacteria, UA RARE (*)    All other components within normal limits  LACTIC ACID, PLASMA - Abnormal; Notable for the following components:   Lactic Acid, Venous 3.3 (*)    All other components within normal limits  CBG  MONITORING, ED - Abnormal; Notable for the following components:   Glucose-Capillary 148 (*)    All other components within normal limits  CULTURE, BLOOD (ROUTINE X 2)  CULTURE, BLOOD (ROUTINE X 2)  RESP PANEL BY RT-PCR (FLU A&B, COVID) ARPGX2  URINE CULTURE  LACTIC ACID, PLASMA  HEMOGLOBIN A1C  LIPID PANEL    EKG EKG Interpretation  Date/Time:  Thursday March 24 2021 14:23:54 EDT Ventricular Rate:  87 PR Interval:  182 QRS Duration: 163 QT Interval:  434 QTC Calculation: 523 R Axis:   136 Text Interpretation: Sinus rhythm Right bundle branch block since last tracing no significant change Confirmed by Noemi Chapel [phone]) on 03/24/2021 4:57:16 PM  Radiology DG Chest Portable 1 View  Result Date: 03/24/2021 CLINICAL DATA:  Hematuria with history of asthma. EXAM: PORTABLE CHEST 1 VIEW COMPARISON:  January 30, 2021 FINDINGS: Multiple sternal wires are seen. A dual lead AICD is noted. There is no evidence of acute infiltrate, pleural effusion or pneumothorax. Moderate to marked severity enlargement of the cardiac silhouette is noted. The visualized skeletal structures are unremarkable. IMPRESSION: 1. Moderate to marked severity cardiomegaly without acute or active cardiopulmonary disease. Electronically Signed   By: Virgina Norfolk M.D.   On: 03/24/2021 18:01   CT Renal Stone Study  Result Date: 03/24/2021 CLINICAL DATA:  Hematuria, flank pain EXAM: CT ABDOMEN AND PELVIS WITHOUT CONTRAST TECHNIQUE: Multidetector CT imaging of the abdomen and pelvis was performed following the standard protocol without IV contrast. COMPARISON:  11/12/2020 FINDINGS: Lower chest: Lung bases are clear. No effusions. Heart is normal size. Pacer wires noted in the right heart. Hepatobiliary: Diffuse fatty infiltration of the liver. Liver is enlarged with a craniocaudal length of 28 cm. Small stone layering in the gallbladder. No biliary ductal dilatation. No focal hepatic abnormality. Pancreas: No focal  abnormality or ductal dilatation. Spleen: Enlarged with a craniocaudal length of 16.5 cm. Adrenals/Urinary Tract: No adrenal abnormality. No focal renal abnormality. No stones or hydronephrosis. Urinary bladder is unremarkable. Stomach/Bowel: Stomach, large and small bowel grossly unremarkable. Vascular/Lymphatic: Aortic atherosclerosis. No evidence of aneurysm or adenopathy. Reproductive: No visible focal abnormality. Other: No free fluid or free air. Musculoskeletal: No acute bony abnormality. IMPRESSION: Hepatosplenomegaly.  Hepatic steatosis. Aortic atherosclerosis. Electronically Signed   By: Rolm Baptise M.D.   On: 03/24/2021 18:25    Procedures .Critical Care E&M Performed by: Marcello Fennel, PA-C  Critical care provider statement:    Critical care time (minutes):  45   Critical care time was exclusive of:  Separately billable procedures and treating other patients   Critical care was necessary to treat or prevent imminent or life-threatening deterioration of the following conditions:  Sepsis and shock  Critical care was time spent personally by me on the following activities:  Ordering and performing treatments and interventions, ordering and review of laboratory studies, ordering and review of radiographic studies, pulse oximetry, re-evaluation of patient's condition, examination of patient and evaluation of patient's response to treatment   I assumed direction of critical care for this patient from another provider in my specialty: no     Care discussed with: admitting provider   After initial E/M assessment, critical care services were subsequently performed that were exclusive of separately billable procedures or treatment.     Medications Ordered in ED Medications  0.9 %  sodium chloride infusion (has no administration in time range)  cefTRIAXone (ROCEPHIN) 1 g in sodium chloride 0.9 % 100 mL IVPB (has no administration in time range)  insulin aspart (novoLOG) injection 0-15  Units (has no administration in time range)  insulin aspart (novoLOG) injection 0-5 Units (has no administration in time range)  sodium chloride 0.9 % bolus 1,000 mL (0 mLs Intravenous Stopped 03/24/21 1834)  cefTRIAXone (ROCEPHIN) 1 g in sodium chloride 0.9 % 100 mL IVPB (0 g Intravenous Stopped 03/24/21 1834)    ED Course  I have reviewed the triage vital signs and the nursing notes.  Pertinent labs & imaging results that were available during my care of the patient were reviewed by me and considered in my medical decision making (see chart for details).    MDM Rules/Calculators/A&P                          Initial impression-presents with dysuria and hematuria.  He is alert, does not appear acute stress, vital signs reassuring.  Likely patient is having from a probable kidney stone, will obtain basic lab work-up and reassess.  Work-up-CBC shows leukocytosis of 20.7, normocytic anemia hemoglobin 12.6, BMP shows sodium of 134, chloride 91, glucose 132, BUN of 27, creatinine 1.80 baseline appears to be around 1.4, GFR 46, UA shows moderate leukocytes, many red blood cells, white blood cells, rare bacteria.  Reassessment-presentation and UA are consistent with probable kidney stone, will obtain CT renal for further evaluation.  Noted the patient's blood pressure has dropped, he is now 91/31, I personally rechecked the patient's he had a BP of 87/47.  We will add on a liter of fluids, add on lactic as well as a blood cultures we will add antibiotics as I am concern for possible septic stone.   Patient was reassessed after fluid resuscitation, BP is now stabilized, he has no complaints this time, will continue to monitor.  Reassessed the patient, he has no complaints this time, vital signs remained stable, will recommend admission  sepsis secondary due to UTI.  He is agreement this plan.  Will consult hospitalist for admission.  Consult- spoke with Dr. Josephine Cables who will admit the  patient.  Rule out-I have low suspicion for [place], kidney stone as CT imaging is negative for these findings.  I have low suspicion for appendicitis as he has no right lower quadrant tenderness, presentation is atypical of etiology.  I have low suspicion for epididymitis, prostatitis as he has no testicular pain, has no saddle pain, denies any rectal discharge.  I have low suspicion for dissection of the aorta as presentation atypical of etiology.  Low suspicion for pneumonia as lung sounds are clear bilaterally, chest x-ray unremarkable.   Patient did met sepsis criteria but will defer on 30 mL/kg of fluid resuscitation's as he has  known CHF and did not want to cause volume overload.   Plan-  Admission for sepsis as he was tachycardic at 96, became hypotensive, has a white count of 20.7 with likely source of UTI as urine is clearly abnormal.  will need further evaluation and management. Final Clinical Impression(s) / ED Diagnoses Final diagnoses:  Sepsis, due to unspecified organism, unspecified whether acute organ dysfunction present [facility])  Acute cystitis with hematuria    Rx / DC Orders ED Discharge Orders     None        Aron Baba 03/24/21 2129    Margette Fast, MD 03/31/21 1826

## 2021-03-24 NOTE — ED Triage Notes (Signed)
Pt reports hematuria that started this morning.

## 2021-03-24 NOTE — ED Notes (Signed)
Date and time results received: 03/24/21 1845   Test: Lactic acid  Critical Value: 3.3  Name of Provider Notified: PA Darrick Penna.   Orders Received? Or Actions Taken?: Notified

## 2021-03-24 NOTE — H&P (Addendum)
History and Physical  JAMESEN STAHNKE YKD:[id number] DOB: 1970/10/03 DOA: 03/24/2021  Referring physician: Marcello Fennel, PA-C PCP: [name], MD  Patient coming from: Home  Chief Complaint: Blood in urine  HPI: Reginald [name] is an obese 50 y.o. male with medical history significant for type 2 diabetes mellitus, gout, hypertension, hyperlipidemia, COPD, GERD, A. fib on Xarelto and CHF who presents to the emergency department due to 1 day of painful urination.  Patient complains of noticing blood in his urine when he woke up this morning and this was associated with pain on urination.  He complains of decreased urinary volume.  He states that he has tendency to urinate, but was only able to produce little urine on straining.  He denies fever, chills, flank pain, suprapubic pain, discharge from penis and denies history of kidney stones.  There was no known alleviating/aggravating factors.  He ambulates with a cane at baseline and states that he quit smoking in April 2019.  Patient decided to go to the ED for further evaluation and management.   ED Course:  In the emergency department, he was intermittently tachypneic, he was hypotensive (BP 86/47), but other vital signs are within normal range.  Work-up in the ED showed leukocytosis and normocytic anemia.  BMP showed hyperglycemia (CBG 132), BUN/creatinine 27/1.80 (baseline creatinine at 1.2-1.4), lactic acid 3.3 > 1.9, urinalysis was positive for hematuria, proteinuria and glucosuria.  Influenza A, B, SARS coronavirus 2 was negative. CT abdomen and pelvis without contrast showed hepatosplenomegaly.  Hepatic steatosis Chest x-ray showed moderate to marked severity cardiomegaly without acute or active cardiopulmonary disease. IV hydration was provided and patient was started on IV ceftriaxone.  Hospitalist was asked to admit patient for further evaluation and management.  Review of Systems: Constitutional: Negative for chills and fever.   HENT: Negative for ear pain and sore throat.   Eyes: Negative for pain and visual disturbance.  Respiratory: Negative for cough, chest tightness and shortness of breath.   Cardiovascular: Negative for chest pain and palpitations.  Gastrointestinal: Negative for abdominal pain and vomiting.  Endocrine: Negative for polyphagia and polyuria.  Genitourinary: Positive for decreased urine volume, pain on urination and blood in urine.   Musculoskeletal: Negative for arthralgias and back pain.  Skin: Negative for color change and rash.  Allergic/Immunologic: Negative for immunocompromised state.  Neurological: Negative for tremors, syncope, speech difficulty, weakness, light-headedness and headaches.  Hematological: Does not bruise/bleed easily.  All other systems reviewed and are negative   Past Medical History:  Diagnosis Date   Anxiety    Aortic stenosis    Arthritis    Asthma    Back pain    Cellulitis of skin with lymphangitis    CHF (congestive heart failure) (HCC)    Chronic diastolic congestive heart failure (HCC)    Chronic venous insufficiency    Constipation    COPD (chronic obstructive pulmonary disease) (HCC)    Depression    Diabetes ([place])    Dyspnea    Essential hypertension    Gout    Heart murmur    Hypertension    Morbid obesity ([place])    Obesity    Pneumonia    walking pneumonia   Prostatitis    Pulmonary embolism ([place])    Pulmonary hypertension (HCC)    Pyelonephritis    S/P aortic valve replacement with bioprosthetic valve 08/23/2017   25 mm Edwards Inspiris Resilia stented bovine pericardial tissue valve   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  24 mm Hemashield supracoronary straight graft    Sleep apnea    cpap    Thoracic ascending aortic aneurysm    Thoracic ascending aortic aneurysm    Tobacco abuse    Past Surgical History:  Procedure Laterality Date   AORTIC VALVE REPLACEMENT N/A 08/23/2017   Procedure: AORTIC VALVE REPLACEMENT (AVR)  USING INSPIRIS RESILIA AORTIC VALVE SIZE 25 MM;  Surgeon: Rexene Alberts, MD;  Location: [facility];  Service: Open Heart Surgery;  Laterality: N/A;   CARDIAC VALVE REPLACEMENT N/A    Phreesia 02/07/2020   COLONOSCOPY WITH PROPOFOL N/A 11/14/2020   Procedure: COLONOSCOPY WITH PROPOFOL;  Surgeon: Gatha Mayer, MD;  Location: [facility] ENDOSCOPY;  Service: Endoscopy;  Laterality: N/A;   ESOPHAGOGASTRODUODENOSCOPY (EGD) WITH PROPOFOL N/A 11/14/2020   Procedure: ESOPHAGOGASTRODUODENOSCOPY (EGD) WITH PROPOFOL;  Surgeon: Gatha Mayer, MD;  Location: [place];  Service: Endoscopy;  Laterality: N/A;   MULTIPLE EXTRACTIONS WITH ALVEOLOPLASTY N/A 07/23/2017   Procedure: Extraction of tooth #10 with alveoloplasty and gross debridement of remaining teeth;  Surgeon: Lenn Cal, DDS;  Location: [place];  Service: Oral Surgery;  Laterality: N/A;   PACEMAKER IMPLANT N/A 08/28/2017    St Jude Medical Assurity MRI conditional  dual-chamber pacemaker for symptomatic complete heart blockby Dr Rayann Heman   TEE WITHOUT CARDIOVERSION N/A 07/16/2017   Procedure: TRANSESOPHAGEAL ECHOCARDIOGRAM (TEE);  Surgeon: Lelon Perla, MD;  Location: [facility] ENDOSCOPY;  Service: Cardiovascular;  Laterality: N/A;   TEE WITHOUT CARDIOVERSION N/A 08/23/2017   Procedure: TRANSESOPHAGEAL ECHOCARDIOGRAM (TEE);  Surgeon: Rexene Alberts, MD;  Location: [place];  Service: Open Heart Surgery;  Laterality: N/A;   THORACIC AORTIC ANEURYSM REPAIR N/A 08/23/2017   Procedure: THORACIC ASCENDING ANEURYSM REPAIR (AAA) USING HEMASHIELD GOLD KNITTED MICROVEL DOUBLE VELOUR VASCULAR GRAFT D: 24 MM  L: 30 CM;  Surgeon: Rexene Alberts, MD;  Location: [place];  Service: Open Heart Surgery;  Laterality: N/A;    Social History:  reports that he quit smoking about 3 years ago. His smoking use included cigarettes. He has a 32.00 pack-year smoking history. He has never used smokeless tobacco. He reports that he does not drink alcohol and does not use  drugs.   No Known Allergies  Family History  Problem Relation Age of Onset   Hypertension Mother    Alzheimer's disease Mother    Heart attack Mother    Heart attack Brother    Diabetes Brother      Prior to Admission medications   Medication Sig Start Date End Date Taking? Authorizing Provider  acetaminophen (TYLENOL) 325 MG tablet Take 650 mg by mouth every 6 (six) hours as needed for mild pain.   Yes [provider]  albuterol (PROVENTIL) (2.5 MG/3ML) 0.083% nebulizer solution INHALE 1 VIAL VIA NEBULIZER EVERY 6 HOURS AS NEEDED FOR WHEEZING OR SHORTNESS OF BREATH 02/01/21  Yes [name], MD  allopurinol (ZYLOPRIM) 300 MG tablet TAKE 1 TABLET EVERY DAY 11/11/20  Yes [name], MD  ammonium lactate (AMLACTIN) 12 % lotion Apply 1 application topically as needed for dry skin. 10/13/20  Yes Felipa Furnace, DPM  buPROPion (WELLBUTRIN XL) 300 MG 24 hr tablet Take 1 tablet (300 mg total) by mouth daily. 02/17/21  Yes [name], MD  docusate sodium (COLACE) 100 MG capsule Take 2 capsules (200 mg total) by mouth 2 (two) times daily. 11/15/20  Yes Thurnell Lose, MD  empagliflozin (JARDIANCE) 10 MG TABS tablet Take 1 tablet (10 mg  total) by mouth daily before breakfast. 01/12/21  Yes Strader, Fransisco Hertz, PA-C  FEROSUL 325 (65 Fe) MG tablet TAKE 1 TABLET EVERY DAY 12/23/20  Yes [name], MD  fexofenadine (ALLEGRA) 180 MG tablet Take 180 mg by mouth daily.   Yes [provider]  gabapentin (NEURONTIN) 100 MG capsule TAKE 1 CAPSULE IN THE MORNING AND TAKE 2 CAPSULES IN THE EVENING 03/09/21  Yes [name], MD  glipiZIDE (GLUCOTROL XL) 5 MG 24 hr tablet Take 1 tablet (5 mg total) by mouth daily with breakfast. 03/16/21  Yes [name], MD  hydrOXYzine (ATARAX/VISTARIL) 25 MG tablet Take 1 tablet (25 mg total) by mouth every 8 (eight) hours as needed. 02/03/21  Yes [name], MD  Krill Oil 350 MG CAPS Take 1 capsule by mouth in the morning and at  bedtime.    Yes [provider]  lisinopril (ZESTRIL) 20 MG tablet Take 1 tablet (20 mg total) by mouth daily. 02/09/21 05/10/21 Yes Strader, Fransisco Hertz, PA-C  metFORMIN (GLUCOPHAGE) 850 MG tablet TAKE 1 TABLET TWICE DAILY WITH MEALS 03/11/21  Yes [name], MD  metolazone (ZAROXOLYN) 5 MG tablet TAKE 1 TABLET (5 MG TOTAL) BY MOUTH THREE TIMES A WEEK. 01/26/21  Yes Allred, Jeneen Rinks, MD  metoprolol tartrate (LOPRESSOR) 100 MG tablet TAKE 1 TABLET TWICE DAILY 12/27/20  Yes Josue Hector, MD  pantoprazole (PROTONIX) 40 MG tablet Take 1 tablet (40 mg total) by mouth daily. 07/14/20  Yes [name], MD  polyethylene glycol (MIRALAX) 17 g packet Take 17 g by mouth daily as needed. 11/15/20  Yes Thurnell Lose, MD  potassium chloride SA (KLOR-CON) 20 MEQ tablet TAKE 3 TABS DAILY AND TAKE AN ADDITIONAL 1 TAB ON THE DAY YOU TAKE YOUR WEEKLY METOLAZONE DOSE 12/22/20  Yes Strader, [place] M, PA-C  rivaroxaban (XARELTO) 20 MG TABS tablet TAKE 1 Tablet BY MOUTH ONCE EVERY DAY WITH SUPPER 10/20/20  Yes Josue Hector, MD  Semaglutide (RYBELSUS) 14 MG TABS Take 14 mg by mouth in the morning. 02/08/21  Yes [name], MD  Tiotropium Bromide Monohydrate (SPIRIVA RESPIMAT) 2.5 MCG/ACT AERS Inhale 2 puffs into the lungs daily. 02/03/21  Yes [name], MD  torsemide (DEMADEX) 20 MG tablet Take 2 tablets (40 mg total) by mouth 2 (two) times daily. 02/04/21  Yes Strader, Fransisco Hertz, PA-C  albuterol (VENTOLIN HFA) 108 (90 Base) MCG/ACT inhaler INHALE 2 PUFFS INTO THE LUNGS EVERY 6 (SIX) HOURS AS NEEDED FOR WHEEZING OR SHORTNESS OF BREATH. 12/10/20   [name], MD  atorvastatin (LIPITOR) 40 MG tablet TAKE 1 TABLET EVERY DAY 02/14/21   [name], MD  Blood Glucose Monitoring Suppl (TRUE METRIX METER) w/Device KIT USE AS DIRECTED 08/19/20   [name], MD  diclofenac Sodium (VOLTAREN) 1 % GEL Apply 2 g topically 4 (four) times daily. 10/26/20   Mordecai Rasmussen, MD  lidocaine-prilocaine (EMLA)  cream Apply 1 application topically as needed. 02/18/21   Chase Picket, MD  terbinafine (LAMISIL) 250 MG tablet Take 1 tablet by mouth once daily 01/25/21   [name], MD  TRUE METRIX BLOOD GLUCOSE TEST test strip TEST ONE TIME DAILY FOR DIABETES 10/25/20   [name], MD  TRUEplus Lancets 33G MISC USE TO TEST ONE TIME DAILY FOR DIABETES 10/25/20   [name], MD    Physical Exam: BP 101/65   Pulse 88   Temp 99.7 F (  37.6 C) (Oral)   Resp (!) 22   SpO2 99%   General: 50 y.o. year-old morbidly obese male well developed well nourished in no acute distress.  Alert and oriented x3. HEENT: NCAT, EOMI Neck: Supple, trachea medial Cardiovascular: Pacemaker insertion site on left chest wall area noted.  Regular rate and rhythm with no rubs or gallops.  No thyromegaly or JVD noted.  No lower extremity edema. 2/4 pulses in all 4 extremities. Respiratory: Clear to auscultation with no wheezes or rales. Good inspiratory effort. Abdomen: Bilateral flank tenderness ( L > R ). Soft, Obese, non-tender nondistended with normal bowel sounds x4 quadrants. Muskuloskeletal: No cyanosis, clubbing or edema noted bilaterally Neuro: CN II-XII intact, strength 5/5 x 4, sensation, reflexes intact Skin: No ulcerative lesions noted or rashes Psychiatry: Judgement and insight appear normal. Mood is appropriate for condition and setting          Labs on Admission:  Basic Metabolic Panel: Recent Labs  Lab 03/24/21 1431  NA 134*  K 4.2  CL 91*  CO2 31  GLUCOSE 132*  BUN 27*  CREATININE 1.80*  CALCIUM 9.0   Liver Function Tests: No results for input(s): AST, ALT, ALKPHOS, BILITOT, PROT, ALBUMIN in the last 168 hours. No results for input(s): LIPASE, AMYLASE in the last 168 hours. No results for input(s): AMMONIA in the last 168 hours. CBC: Recent Labs  Lab 03/24/21 1431  WBC 20.7*  NEUTROABS 17.0*  HGB 12.6*  HCT 41.0  MCV 83.3  PLT 310   Cardiac Enzymes: No results for input(s):  CKTOTAL, CKMB, CKMBINDEX, TROPONINI in the last 168 hours.  BNP (last 3 results) Recent Labs    07/09/20 2131 12/20/20 1238 01/30/21 1858  BNP 70.0 62.0 67.0    ProBNP (last 3 results) No results for input(s): PROBNP in the last 8760 hours.  CBG: Recent Labs  Lab 03/24/21 1632 03/24/21 2144  GLUCAP 148* 195*    Radiological Exams on Admission: DG Chest Portable 1 View  Result Date: 03/24/2021 CLINICAL DATA:  Hematuria with history of asthma. EXAM: PORTABLE CHEST 1 VIEW COMPARISON:  January 30, 2021 FINDINGS: Multiple sternal wires are seen. A dual lead AICD is noted. There is no evidence of acute infiltrate, pleural effusion or pneumothorax. Moderate to marked severity enlargement of the cardiac silhouette is noted. The visualized skeletal structures are unremarkable. IMPRESSION: 1. Moderate to marked severity cardiomegaly without acute or active cardiopulmonary disease. Electronically Signed   By: Virgina Norfolk M.D.   On: 03/24/2021 18:01   CT Renal Stone Study  Result Date: 03/24/2021 CLINICAL DATA:  Hematuria, flank pain EXAM: CT ABDOMEN AND PELVIS WITHOUT CONTRAST TECHNIQUE: Multidetector CT imaging of the abdomen and pelvis was performed following the standard protocol without IV contrast. COMPARISON:  11/12/2020 FINDINGS: Lower chest: Lung bases are clear. No effusions. Heart is normal size. Pacer wires noted in the right heart. Hepatobiliary: Diffuse fatty infiltration of the liver. Liver is enlarged with a craniocaudal length of 28 cm. Small stone layering in the gallbladder. No biliary ductal dilatation. No focal hepatic abnormality. Pancreas: No focal abnormality or ductal dilatation. Spleen: Enlarged with a craniocaudal length of 16.5 cm. Adrenals/Urinary Tract: No adrenal abnormality. No focal renal abnormality. No stones or hydronephrosis. Urinary bladder is unremarkable. Stomach/Bowel: Stomach, large and small bowel grossly unremarkable. Vascular/Lymphatic: Aortic  atherosclerosis. No evidence of aneurysm or adenopathy. Reproductive: No visible focal abnormality. Other: No free fluid or free air. Musculoskeletal: No acute bony abnormality. IMPRESSION: Hepatosplenomegaly.  Hepatic steatosis. Aortic  atherosclerosis. Electronically Signed   By: Rolm Baptise M.D.   On: 03/24/2021 18:25    EKG: I independently viewed the EKG done and my findings are as followed: Normal sinus rhythm at a rate of 87 bpm with RBBB and QTc (523 ms)  Assessment/Plan Present on Admission:  UTI (urinary tract infection)  Essential hypertension  Morbid obesity ([place])  Chronic heart failure with preserved ejection fraction (HFpEF) (HCC)  Gastroesophageal reflux disease  COPD (chronic obstructive pulmonary disease) (HCC)  Iron deficiency anemia  Principal Problem:   UTI (urinary tract infection) Active Problems:   Morbid obesity ([place])   Essential hypertension   Chronic heart failure with preserved ejection fraction (HFpEF) (HCC)   Gastroesophageal reflux disease   COPD (chronic obstructive pulmonary disease) (HCC)   OSA on CPAP   Iron deficiency anemia   Lactic acidosis   Leukocytosis   Hyperglycemia due to diabetes mellitus (HCC)   Mixed hyperlipidemia   Hematuria   Atrial fibrillation, chronic (HCC)   Prolonged QT interval  Presumed sepsis secondary to UTI POA Patient has intermittent tachypnea and leukocytosis.  He complained of dysuria, hematuria, oliguria.  Lactic acid was elevated (3.3) Patient was empirically started on IV ceftriaxone, we shall continue same at this time Continue gentle IV hydration Blood culture and urine culture pending  Lactic acidosis in the setting of above- resolved Lactic acid 3.3 > 1.9  Hematuria possibly secondary to multifactorial This may be due to UTI, BPH, bladder cancer or bleeding due to anticoagulant (patient is on Xarelto) CT abdomen and pelvis showed hepatosplenomegaly and hepatic steatosis Lipid profile will be  checked Considering patient with LUTS, urology will be consulted  Leukocytosis possibly reactive to UTI/reactive process Continue IV ceftriaxone as described above for UTI  Hyperglycemia secondary to type 2 diabetes mellitus Continue ISS and hypoglycemic protocol Glipizide, Jardiance and metformin will be held at this time  Prolonged QTc (523 ms) This may not be reliable considering that patient has a pacemaker  OSA on CPAP Continue CPAP  History of complete heart block s/p pacemaker placement Last review of pacemaker on 02/23/2021 showed normal device function  Essential hypertension BP meds will be held at this time due to soft BP  Hyperlipidemia Continue Lipitor  GERD Continue Protonix  COPD Continue Proventil, Ventolin and Spiriva as needed  Chronic A. fib Lopressor will be temporarily held at this time due to soft BP Xarelto will be temporarily held due to hematuria  Diabetic neuropathy Continue gabapentin per home regimen  History of diastolic CHF Echocardiogram done on 07/28/2020 showed LVEF of 60 to 65% Diuretics, beta-blocker and ACE inhibitor will be temporarily held due to soft BP  Iron deficiency anemia Continue ferrous sulfate  Morbid obesity (60.8 kg/meter squared) Patient was counseled on diet and lifestyle modification Patient will need to follow-up with outpatient PCP for weight loss program   DVT prophylaxis: SCDs  Code Status: Full code  Family Communication: Sister at bedside (all questions answered to satisfaction)  Disposition Plan:  Patient is from:                        home Anticipated DC to:                   SNF or family members home Anticipated DC date:               2-3 days Anticipated DC barriers:          Patient  requires inpatient management due to UTI requiring IV antibiotics    Consults called: None  Admission status: Inpatient    Bernadette Hoit MD [facility]  03/24/2021, 9:47 PM

## 2021-03-24 NOTE — ED Notes (Signed)
In and Out placed and pt returned 261ml of urine. Culture sent to lab.

## 2021-03-24 NOTE — Plan of Care (Signed)
  Problem: Education: Goal: Knowledge of General Education information will improve Description Including pain rating scale, medication(s)/side effects and non-pharmacologic comfort measures Outcome: Progressing   Problem: Health Behavior/Discharge Planning: Goal: Ability to manage health-related needs will improve Outcome: Progressing   

## 2021-03-25 ENCOUNTER — Encounter (HOSPITAL_COMMUNITY): Payer: Self-pay | Admitting: Internal Medicine

## 2021-03-25 DIAGNOSIS — A419 Sepsis, unspecified organism: Secondary | ICD-10-CM | POA: Diagnosis not present

## 2021-03-25 DIAGNOSIS — I5033 Acute on chronic diastolic (congestive) heart failure: Secondary | ICD-10-CM

## 2021-03-25 DIAGNOSIS — I482 Chronic atrial fibrillation, unspecified: Secondary | ICD-10-CM | POA: Diagnosis not present

## 2021-03-25 LAB — LIPID PANEL
Cholesterol: 97 mg/dL (ref 0–200)
HDL: 27 mg/dL — ABNORMAL LOW (ref >40)
LDL Cholesterol: 42 mg/dL (ref 0–99)
Total CHOL/HDL Ratio: 3.6 RATIO
Triglycerides: 138 mg/dL (ref <150)
VLDL: 28 mg/dL (ref 0–40)

## 2021-03-25 LAB — COMPREHENSIVE METABOLIC PANEL
ALT: 20 U/L (ref 0–44)
AST: 32 U/L (ref 15–41)
Albumin: 3.2 g/dL — ABNORMAL LOW (ref 3.5–5.0)
Alkaline Phosphatase: 75 U/L (ref 38–126)
Anion gap: 12 (ref 5–15)
BUN: 37 mg/dL — ABNORMAL HIGH (ref 6–20)
CO2: 30 mmol/L (ref 22–32)
Calcium: 8.3 mg/dL — ABNORMAL LOW (ref 8.9–10.3)
Chloride: 93 mmol/L — ABNORMAL LOW (ref 98–111)
Creatinine, Ser: 2.17 mg/dL — ABNORMAL HIGH (ref 0.61–1.24)
GFR, Estimated: 36 mL/min — ABNORMAL LOW (ref >60)
Glucose, Bld: 127 mg/dL — ABNORMAL HIGH (ref 70–99)
Potassium: 3.4 mmol/L — ABNORMAL LOW (ref 3.5–5.1)
Sodium: 135 mmol/L (ref 135–145)
Total Bilirubin: 1.9 mg/dL — ABNORMAL HIGH (ref 0.3–1.2)
Total Protein: 7.5 g/dL (ref 6.5–8.1)

## 2021-03-25 LAB — CBC
HCT: 36.7 % — ABNORMAL LOW (ref 39.0–52.0)
Hemoglobin: 11.1 g/dL — ABNORMAL LOW (ref 13.0–17.0)
MCH: 25.2 pg — ABNORMAL LOW (ref 26.0–34.0)
MCHC: 30.2 g/dL (ref 30.0–36.0)
MCV: 83.4 fL (ref 80.0–100.0)
Platelets: 239 10*3/uL (ref 150–400)
RBC: 4.4 MIL/uL (ref 4.22–5.81)
RDW: 18.6 % — ABNORMAL HIGH (ref 11.5–15.5)
WBC: 20.6 10*3/uL — ABNORMAL HIGH (ref 4.0–10.5)
nRBC: 0.1 % (ref 0.0–0.2)

## 2021-03-25 LAB — HEMOGLOBIN A1C
Hgb A1c MFr Bld: 6.4 % — ABNORMAL HIGH (ref 4.8–5.6)
Mean Plasma Glucose: 136.98 mg/dL

## 2021-03-25 LAB — MAGNESIUM: Magnesium: 2 mg/dL (ref 1.7–2.4)

## 2021-03-25 LAB — GLUCOSE, CAPILLARY
Glucose-Capillary: 178 mg/dL — ABNORMAL HIGH (ref 70–99)
Glucose-Capillary: 163 mg/dL — ABNORMAL HIGH (ref 70–99)

## 2021-03-25 LAB — PHOSPHORUS: Phosphorus: 5 mg/dL — ABNORMAL HIGH (ref 2.5–4.6)

## 2021-03-25 LAB — PROTIME-INR
INR: 1.3 — ABNORMAL HIGH (ref 0.8–1.2)
Prothrombin Time: 16.5 seconds — ABNORMAL HIGH (ref 11.4–15.2)

## 2021-03-25 LAB — APTT: aPTT: 30 seconds (ref 24–36)

## 2021-03-25 MED ORDER — CEFDINIR 300 MG PO CAPS: 300.0000 mg | ORAL_CAPSULE | Freq: Two times a day (BID) | ORAL | 0 refills | Status: DC

## 2021-03-25 MED ORDER — SODIUM CHLORIDE 0.9 % IV BOLUS: 500.0000 mL | Freq: Once | INTRAVENOUS | Status: DC

## 2021-03-25 MED ORDER — FUROSEMIDE 10 MG/ML IJ SOLN
40.0000 mg | Freq: Two times a day (BID) | INTRAMUSCULAR | Status: DC
  Administered 2021-03-25: 40 mg via INTRAVENOUS
  Filled 2021-03-25: qty 4

## 2021-03-25 MED ORDER — ACETAMINOPHEN 325 MG PO TABS
650.0000 mg | ORAL_TABLET | Freq: Four times a day (QID) | ORAL | Status: DC | PRN
  Administered 2021-03-25: 650 mg via ORAL
  Filled 2021-03-25: qty 2

## 2021-03-25 NOTE — Discharge Summary (Signed)
Physician Discharge Summary  [name] WFU:[id number] DOB: [birth year] DOA: 03/24/2021  PCP: [name], MD  Admit date: 03/24/2021 Discharge date: 03/25/2021  Admitted From: Home Disposition:  AGAINST MEDICAL ADVICE   Discharge Condition:  AGAINST MEDICAL ADVICE    Brief/Interim Summary: 50 year old male with a history of diabetes mellitus type 2, hypertension, hyperlipidemia, paroxysmal atrial fibrillation, OSA on CPAP, diastolic CHF, bioprosthetic AVR, and morbid obesity presenting with dysuria and hematuria that began on 03/24/2021.  He stated that he has some subjective fevers and chills.  He had some mild lower abdominal pain.  He denied any nausea, vomiting, diarrhea.  Notably, the patient states that he has been more short of breath in the past couple days.  He has had more dyspnea on exertion.  He denies any chest pain, coughing, hemoptysis, hematochezia, melena, headache, neck pain. In the emergency department, the patient had low-grade temperature 100.1 F.  He initially had low blood pressure of 86/47.  Oxygen saturation was 97% on room air.  BMP showed sodium 134, potassium 4.2, CO2 31, serum creatinine 1.0.  WBC 20.7, hemoglobin 12.6, platelets 210,000.  Lactic acid peaked at 3.3.  The patient was fluid resuscitated.  He was started on ceftriaxone.  On the medical floor, he was started on IV lasix for his fluid overload and he was continued on IV ceftriaxone pending culture data.  In speaking with Reginald [name], Reginald [name] has demonstrated the ability to understand his medical condition(s) which include decompensated CHF and sepsis.  Reginald [name] has demonstrated the ability to appreciate how treatment for decompensated CHF and sepsis will be beneficial.   Reginald [name] has also demonstrated the ability to understand and appreciate how refusal of treatement for decompensated CHF and sepsis could result in harm, repeat hospitalization, and possibly death.  Reginald [name]  demonstrates the ability to reason through the risks and benefits of the proposed treatment.  Finally, Reginald [name] is able to clearly communicate his/her choice.   Discharge Diagnoses:   Severe Sepsis -Present on admission with dyspnea, leukocytosis, and elevated lactate -pt was fluid resuscitated initially due to sepsis -Lactic acid peaked 3.3 -Urinary source -UA 11-20 WBC -Continue ceftriaxone pending urine culture data   Hematuria/UTI -CT renal stone protocol--no hydronephrosis, no ureteral stones, urinary bladder unremarkable -Continue ceftriaxone pending urine culture data -Holding rivaroxaban temporarily -hematuria is clearing   Acute on chronic diastolic CHF -Start IV furosemide -07/28/2020 echo--EF 60 to 65%, not optimal to evaluate WMA, mildly reduced RV, trivial MR/TR, status post AVR bioprosthesis -appears clinically fluid overloaded   Paroxysmal atrial fibrillation -Currently in sinus rhythm -Holding rivaroxaban temporarily due to hematuria   Diabetes mellitus type 2 -NovoLog sliding scale -11/12/2020 hemoglobin A1c 5.4 -hold metformin, glipizide and semaglutide   Morbid obesity -BMI 66.9 -Lifestyle modification   OSA -Continue nightly CPAP   Hyperlipidemia -Continue statin   Severe aortic Stenosis S/p AVR with bioprosthetic valve & repair ascending thoracic aortic aneurysm   CHB -s/p [employer] PPM placement in 08/2017 and recent device interrogation earlier this month showed his pacemaker was functioning normally.      Discharge Instructions   Allergies as of 03/25/2021   No Known Allergies      Medication List     STOP taking these medications    lisinopril 20 MG tablet Commonly known as: ZESTRIL       TAKE these medications    acetaminophen 325 MG tablet Commonly known as: TYLENOL Take 650  mg by mouth every 6 (six) hours as needed for mild pain.   albuterol 108 (90 Base) MCG/ACT inhaler Commonly known as: VENTOLIN HFA INHALE 2  PUFFS INTO THE LUNGS EVERY 6 (SIX) HOURS AS NEEDED FOR WHEEZING OR SHORTNESS OF BREATH.   albuterol (2.5 MG/3ML) 0.083% nebulizer solution Commonly known as: PROVENTIL INHALE 1 VIAL VIA NEBULIZER EVERY 6 HOURS AS NEEDED FOR WHEEZING OR SHORTNESS OF BREATH   allopurinol 300 MG tablet Commonly known as: ZYLOPRIM TAKE 1 TABLET EVERY DAY   ammonium lactate 12 % lotion Commonly known as: AmLactin Apply 1 application topically as needed for dry skin.   atorvastatin 40 MG tablet Commonly known as: LIPITOR TAKE 1 TABLET EVERY DAY   buPROPion 300 MG 24 hr tablet Commonly known as: WELLBUTRIN XL Take 1 tablet (300 mg total) by mouth daily.   cefdinir 300 MG capsule Commonly known as: OMNICEF Take 1 capsule (300 mg total) by mouth 2 (two) times daily.   diclofenac Sodium 1 % Gel Commonly known as: Voltaren Apply 2 g topically 4 (four) times daily.   docusate sodium 100 MG capsule Commonly known as: COLACE Take 2 capsules (200 mg total) by mouth 2 (two) times daily.   empagliflozin 10 MG Tabs tablet Commonly known as: Jardiance Take 1 tablet (10 mg total) by mouth daily before breakfast.   FeroSul 325 (65 FE) MG tablet Generic drug: ferrous sulfate TAKE 1 TABLET EVERY DAY   fexofenadine 180 MG tablet Commonly known as: ALLEGRA Take 180 mg by mouth daily.   gabapentin 100 MG capsule Commonly known as: NEURONTIN TAKE 1 CAPSULE IN THE MORNING AND TAKE 2 CAPSULES IN THE EVENING   glipiZIDE 5 MG 24 hr tablet Commonly known as: GLUCOTROL XL Take 1 tablet (5 mg total) by mouth daily with breakfast.   hydrOXYzine 25 MG tablet Commonly known as: ATARAX/VISTARIL Take 1 tablet (25 mg total) by mouth every 8 (eight) hours as needed.   Krill Oil 350 MG Caps Take 1 capsule by mouth in the morning and at bedtime.   lidocaine-prilocaine cream Commonly known as: EMLA Apply 1 application topically as needed.   metFORMIN 850 MG tablet Commonly known as: GLUCOPHAGE TAKE 1 TABLET  TWICE DAILY WITH MEALS   metolazone 5 MG tablet Commonly known as: ZAROXOLYN TAKE 1 TABLET (5 MG TOTAL) BY MOUTH THREE TIMES A WEEK.   metoprolol tartrate 100 MG tablet Commonly known as: LOPRESSOR TAKE 1 TABLET TWICE DAILY   pantoprazole 40 MG tablet Commonly known as: PROTONIX Take 1 tablet (40 mg total) by mouth daily.   polyethylene glycol 17 g packet Commonly known as: MiraLax Take 17 g by mouth daily as needed.   potassium chloride SA 20 MEQ tablet Commonly known as: KLOR-CON TAKE 3 TABS DAILY AND TAKE AN ADDITIONAL 1 TAB ON THE DAY YOU TAKE YOUR WEEKLY METOLAZONE DOSE   rivaroxaban 20 MG Tabs tablet Commonly known as: Xarelto TAKE 1 Tablet BY MOUTH ONCE EVERY DAY WITH SUPPER   Rybelsus 14 MG Tabs Generic drug: Semaglutide Take 14 mg by mouth in the morning.   Spiriva Respimat 2.5 MCG/ACT Aers Generic drug: Tiotropium Bromide Monohydrate Inhale 2 puffs into the lungs daily.   terbinafine 250 MG tablet Commonly known as: LAMISIL Take 1 tablet by mouth once daily   torsemide 20 MG tablet Commonly known as: DEMADEX Take 2 tablets (40 mg total) by mouth 2 (two) times daily.   True Metrix Blood Glucose Test test strip Generic drug: glucose blood  TEST ONE TIME DAILY FOR DIABETES   True Metrix Meter w/Device Kit USE AS DIRECTED   TRUEplus Lancets 33G Misc USE TO TEST ONE TIME DAILY FOR DIABETES        No Known Allergies  Consultations: none   Procedures/Studies: DG Chest Portable 1 View  Result Date: 03/24/2021 CLINICAL DATA:  Hematuria with history of asthma. EXAM: PORTABLE CHEST 1 VIEW COMPARISON:  January 30, 2021 FINDINGS: Multiple sternal wires are seen. A dual lead AICD is noted. There is no evidence of acute infiltrate, pleural effusion or pneumothorax. Moderate to marked severity enlargement of the cardiac silhouette is noted. The visualized skeletal structures are unremarkable. IMPRESSION: 1. Moderate to marked severity cardiomegaly without  acute or active cardiopulmonary disease. Electronically Signed   By: Virgina Norfolk M.D.   On: 03/24/2021 18:01   CT Renal Stone Study  Result Date: 03/24/2021 CLINICAL DATA:  Hematuria, flank pain EXAM: CT ABDOMEN AND PELVIS WITHOUT CONTRAST TECHNIQUE: Multidetector CT imaging of the abdomen and pelvis was performed following the standard protocol without IV contrast. COMPARISON:  11/12/2020 FINDINGS: Lower chest: Lung bases are clear. No effusions. Heart is normal size. Pacer wires noted in the right heart. Hepatobiliary: Diffuse fatty infiltration of the liver. Liver is enlarged with a craniocaudal length of 28 cm. Small stone layering in the gallbladder. No biliary ductal dilatation. No focal hepatic abnormality. Pancreas: No focal abnormality or ductal dilatation. Spleen: Enlarged with a craniocaudal length of 16.5 cm. Adrenals/Urinary Tract: No adrenal abnormality. No focal renal abnormality. No stones or hydronephrosis. Urinary bladder is unremarkable. Stomach/Bowel: Stomach, large and small bowel grossly unremarkable. Vascular/Lymphatic: Aortic atherosclerosis. No evidence of aneurysm or adenopathy. Reproductive: No visible focal abnormality. Other: No free fluid or free air. Musculoskeletal: No acute bony abnormality. IMPRESSION: Hepatosplenomegaly.  Hepatic steatosis. Aortic atherosclerosis. Electronically Signed   By: Rolm Baptise M.D.   On: 03/24/2021 18:25        Discharge Exam: Vitals:   03/25/21 0825 03/25/21 0834  BP:    Pulse:  88  Resp:    Temp:    SpO2: 94% 94%   Vitals:   03/25/21 0500 03/25/21 0733 03/25/21 0825 03/25/21 0834  BP: 97/70 94/60    Pulse: 88 91  88  Resp:  18    Temp:  98.2 F (36.8 C)    TempSrc:      SpO2:  95% 94% 94%  Height:        General: Pt is alert, awake, not in acute distress Cardiovascular: RRR, S1/S2 +, no rubs, no gallops Respiratory: bibasilar crackles. Abdominal: Soft, NT, ND, bowel sounds + Extremities: 1+LE edema, no  cyanosis   The results of significant diagnostics from this hospitalization (including imaging, microbiology, ancillary and laboratory) are listed below for reference.    Significant Diagnostic Studies: DG Chest Portable 1 View  Result Date: 03/24/2021 CLINICAL DATA:  Hematuria with history of asthma. EXAM: PORTABLE CHEST 1 VIEW COMPARISON:  January 30, 2021 FINDINGS: Multiple sternal wires are seen. A dual lead AICD is noted. There is no evidence of acute infiltrate, pleural effusion or pneumothorax. Moderate to marked severity enlargement of the cardiac silhouette is noted. The visualized skeletal structures are unremarkable. IMPRESSION: 1. Moderate to marked severity cardiomegaly without acute or active cardiopulmonary disease. Electronically Signed   By: Virgina Norfolk M.D.   On: 03/24/2021 18:01   CT Renal Stone Study  Result Date: 03/24/2021 CLINICAL DATA:  Hematuria, flank pain EXAM: CT ABDOMEN AND PELVIS WITHOUT CONTRAST TECHNIQUE: Multidetector  CT imaging of the abdomen and pelvis was performed following the standard protocol without IV contrast. COMPARISON:  11/12/2020 FINDINGS: Lower chest: Lung bases are clear. No effusions. Heart is normal size. Pacer wires noted in the right heart. Hepatobiliary: Diffuse fatty infiltration of the liver. Liver is enlarged with a craniocaudal length of 28 cm. Small stone layering in the gallbladder. No biliary ductal dilatation. No focal hepatic abnormality. Pancreas: No focal abnormality or ductal dilatation. Spleen: Enlarged with a craniocaudal length of 16.5 cm. Adrenals/Urinary Tract: No adrenal abnormality. No focal renal abnormality. No stones or hydronephrosis. Urinary bladder is unremarkable. Stomach/Bowel: Stomach, large and small bowel grossly unremarkable. Vascular/Lymphatic: Aortic atherosclerosis. No evidence of aneurysm or adenopathy. Reproductive: No visible focal abnormality. Other: No free fluid or free air. Musculoskeletal: No acute  bony abnormality. IMPRESSION: Hepatosplenomegaly.  Hepatic steatosis. Aortic atherosclerosis. Electronically Signed   By: Rolm Baptise M.D.   On: 03/24/2021 18:25    Microbiology: Recent Results (from the past 240 hour(s))  Blood culture (routine x 2)     Status: None (Preliminary result)   Collection Time: 03/24/21  5:35 PM   Specimen: BLOOD  Result Value Ref Range Status   Specimen Description BLOOD BLOOD LEFT FOREARM  Final   Special Requests   Final    BOTTLES DRAWN AEROBIC AND ANAEROBIC Blood Culture results may not be optimal due to an inadequate volume of blood received in culture bottles   Culture   Final    NO GROWTH < 24 HOURS Performed at [facility], [address] [place], [place] [address]    Report Status PENDING  Incomplete  Blood culture (routine x 2)     Status: None (Preliminary result)   Collection Time: 03/24/21  5:35 PM   Specimen: BLOOD  Result Value Ref Range Status   Specimen Description BLOOD LEFT ANTECUBITAL  Final   Special Requests   Final    BOTTLES DRAWN AEROBIC AND ANAEROBIC Blood Culture results may not be optimal due to an excessive volume of blood received in culture bottles   Culture   Final    NO GROWTH < 24 HOURS Performed at [facility], [address] [place], [place] [address]    Report Status PENDING  Incomplete  Resp Panel by RT-PCR (Flu A&B, Covid) Nasopharyngeal Swab     Status: None   Collection Time: 03/24/21  6:50 PM   Specimen: Nasopharyngeal Swab; Nasopharyngeal(NP) swabs in vial transport medium  Result Value Ref Range Status   SARS Coronavirus 2 by RT PCR NEGATIVE NEGATIVE Final    Comment: (NOTE) SARS-CoV-2 target nucleic acids are NOT DETECTED.  The SARS-CoV-2 RNA is generally detectable in upper respiratory specimens during the acute phase of infection. The lowest concentration of SARS-CoV-2 viral copies this assay can detect is 138 copies/mL. A negative result does not preclude SARS-Cov-2 infection and should not be  used as the sole basis for treatment or other patient management decisions. A negative result may occur with  improper specimen collection/handling, submission of specimen other than nasopharyngeal swab, presence of viral mutation(s) within the areas targeted by this assay, and inadequate number of viral copies(<138 copies/mL). A negative result must be combined with clinical observations, patient history, and epidemiological information. The expected result is Negative.  Fact Sheet for Patients:  EntrepreneurPulse.com.au  Fact Sheet for Healthcare Providers:  IncredibleEmployment.be  This test is no t yet approved or cleared by the [place] FDA and  has been authorized for detection and/or diagnosis of SARS-CoV-2  by FDA under an Emergency Use Authorization (EUA). This EUA will remain  in effect (meaning this test can be used) for the duration of the COVID-19 declaration under Section 564(b)(1) of the Act, 21 U.S.C.section 360bbb-3(b)(1), unless the authorization is terminated  or revoked sooner.       Influenza A by PCR NEGATIVE NEGATIVE Final   Influenza B by PCR NEGATIVE NEGATIVE Final    Comment: (NOTE) The Xpert Xpress SARS-CoV-2/FLU/RSV plus assay is intended as an aid in the diagnosis of influenza from Nasopharyngeal swab specimens and should not be used as a sole basis for treatment. Nasal washings and aspirates are unacceptable for Xpert Xpress SARS-CoV-2/FLU/RSV testing.  Fact Sheet for Patients: EntrepreneurPulse.com.au  Fact Sheet for Healthcare Providers: IncredibleEmployment.be  This test is not yet approved or cleared by the [place] FDA and has been authorized for detection and/or diagnosis of SARS-CoV-2 by FDA under an Emergency Use Authorization (EUA). This EUA will remain in effect (meaning this test can be used) for the duration of the COVID-19 declaration under Section  564(b)(1) of the Act, 21 U.S.C. section 360bbb-3(b)(1), unless the authorization is terminated or revoked.  Performed at [facility], [address], [place], [place] [address]      Labs: Basic Metabolic Panel: Recent Labs  Lab 03/24/21 1431 03/25/21 0539  NA 134* 135  K 4.2 3.4*  CL 91* 93*  CO2 31 30  GLUCOSE 132* 127*  BUN 27* 37*  CREATININE 1.80* 2.17*  CALCIUM 9.0 8.3*  MG  --  2.0  PHOS  --  5.0*   Liver Function Tests: Recent Labs  Lab 03/25/21 0539  AST 32  ALT 20  ALKPHOS 75  BILITOT 1.9*  PROT 7.5  ALBUMIN 3.2*   No results for input(s): LIPASE, AMYLASE in the last 168 hours. No results for input(s): AMMONIA in the last 168 hours. CBC: Recent Labs  Lab 03/24/21 1431 03/25/21 0539  WBC 20.7* 20.6*  NEUTROABS 17.0*  --   HGB 12.6* 11.1*  HCT 41.0 36.7*  MCV 83.3 83.4  PLT 310 239   Cardiac Enzymes: No results for input(s): CKTOTAL, CKMB, CKMBINDEX, TROPONINI in the last 168 hours. BNP: Invalid input(s): POCBNP CBG: Recent Labs  Lab 03/24/21 1632 03/24/21 2144 03/25/21 0733 03/25/21 1106  GLUCAP 148* 195* 178* 163*    Time coordinating discharge:  36 minutes  Signed:  Orson Eva, DO [facility] Pager: [phone] 03/25/2021, 2:25 PM

## 2021-03-25 NOTE — Progress Notes (Signed)
Dr. Carles Collet saw patient.  Patient wants to go AMA.  Patient signed AMA form.  PIV dc'd, needs addressed.

## 2021-03-25 NOTE — Progress Notes (Signed)
Patient stated "I can be more comfortable at home.  I have a bed that I can adjust to my comfort, it is just very uncomfortable here.  I have lasix I can take at home as well.  I want to go home".  Dr Tat made aware.

## 2021-03-25 NOTE — Chronic Care Management (AMB) (Signed)
Chronic Care Management    Clinical Social Work Note  03/15/21 Name: Reginald [name] MRN: [id number] DOB: 12-Dec-1970  Reginald [name] is a 50 y.o. year old male who is a primary care patient of [name], MD. The CCM team was consulted to assist the patient with chronic disease management and/or care coordination needs related to: Mental Health Counseling and Resources.   Engaged with patient by telephone for follow up visit in response to provider referral for social work chronic care management and care coordination services.   Consent to Services:  The patient was given information about Chronic Care Management services, agreed to services, and gave verbal consent prior to initiation of services.  Please see initial visit note for detailed documentation.   Patient agreed to services and consent obtained.   Assessment: Review of patient past medical history, allergies, medications, and health status, including review of relevant consultants reports was performed today as part of a comprehensive evaluation and provision of chronic care management and care coordination services.     SDOH (Social Determinants of Health) assessments and interventions performed:    Advanced Directives Status: Not addressed in this encounter.  CCM Care Plan  No Known Allergies  No facility-administered encounter medications on file as of 03/15/2021.   Outpatient Encounter Medications as of 03/15/2021  Medication Sig Note   acetaminophen (TYLENOL) 325 MG tablet Take 650 mg by mouth every 6 (six) hours as needed for mild pain.    albuterol (PROVENTIL) (2.5 MG/3ML) 0.083% nebulizer solution INHALE 1 VIAL VIA NEBULIZER EVERY 6 HOURS AS NEEDED FOR WHEEZING OR SHORTNESS OF BREATH    albuterol (VENTOLIN HFA) 108 (90 Base) MCG/ACT inhaler INHALE 2 PUFFS INTO THE LUNGS EVERY 6 (SIX) HOURS AS NEEDED FOR WHEEZING OR SHORTNESS OF BREATH.    allopurinol (ZYLOPRIM) 300 MG tablet TAKE 1 TABLET EVERY DAY    ammonium  lactate (AMLACTIN) 12 % lotion Apply 1 application topically as needed for dry skin.    atorvastatin (LIPITOR) 40 MG tablet TAKE 1 TABLET EVERY DAY    Blood Glucose Monitoring Suppl (TRUE METRIX METER) w/Device KIT USE AS DIRECTED    buPROPion (WELLBUTRIN XL) 300 MG 24 hr tablet Take 1 tablet (300 mg total) by mouth daily.    diclofenac Sodium (VOLTAREN) 1 % GEL Apply 2 g topically 4 (four) times daily.    docusate sodium (COLACE) 100 MG capsule Take 2 capsules (200 mg total) by mouth 2 (two) times daily. 02/21/2021: 2 once a day   empagliflozin (JARDIANCE) 10 MG TABS tablet Take 1 tablet (10 mg total) by mouth daily before breakfast. 03/03/2021: Gets from patient assistance ([employer])   FEROSUL 325 (65 Fe) MG tablet TAKE 1 TABLET EVERY DAY    fexofenadine (ALLEGRA) 180 MG tablet Take 180 mg by mouth daily.    gabapentin (NEURONTIN) 100 MG capsule TAKE 1 CAPSULE IN THE MORNING AND TAKE 2 CAPSULES IN THE EVENING    hydrOXYzine (ATARAX/VISTARIL) 25 MG tablet Take 1 tablet (25 mg total) by mouth every 8 (eight) hours as needed.    Krill Oil 350 MG CAPS Take 1 capsule by mouth in the morning and at bedtime.     lidocaine-prilocaine (EMLA) cream Apply 1 application topically as needed.    lisinopril (ZESTRIL) 20 MG tablet Take 1 tablet (20 mg total) by mouth daily.    metFORMIN (GLUCOPHAGE) 850 MG tablet TAKE 1 TABLET TWICE DAILY WITH MEALS    metolazone (ZAROXOLYN) 5 MG tablet TAKE 1  TABLET (5 MG TOTAL) BY MOUTH THREE TIMES A WEEK. 03/03/2021: Has only been using ~1 time per week recently to maintain fluid   metoprolol tartrate (LOPRESSOR) 100 MG tablet TAKE 1 TABLET TWICE DAILY    pantoprazole (PROTONIX) 40 MG tablet Take 1 tablet (40 mg total) by mouth daily.    polyethylene glycol (MIRALAX) 17 g packet Take 17 g by mouth daily as needed.    potassium chloride SA (KLOR-CON) 20 MEQ tablet TAKE 3 TABS DAILY AND TAKE AN ADDITIONAL 1 TAB ON THE DAY YOU TAKE YOUR WEEKLY METOLAZONE DOSE    rivaroxaban  (XARELTO) 20 MG TABS tablet TAKE 1 Tablet BY MOUTH ONCE EVERY DAY WITH SUPPER    Semaglutide (RYBELSUS) 14 MG TABS Take 14 mg by mouth in the morning. 03/03/2021: Gets from patient assistance ([employer])   terbinafine (LAMISIL) 250 MG tablet Take 1 tablet by mouth once daily    Tiotropium Bromide Monohydrate (SPIRIVA RESPIMAT) 2.5 MCG/ACT AERS Inhale 2 puffs into the lungs daily. 03/03/2021: Gets from patient assistance ([employer])   torsemide (DEMADEX) 20 MG tablet Take 2 tablets (40 mg total) by mouth 2 (two) times daily.    TRUE METRIX BLOOD GLUCOSE TEST test strip TEST ONE TIME DAILY FOR DIABETES    TRUEplus Lancets 33G MISC USE TO TEST ONE TIME DAILY FOR DIABETES    [DISCONTINUED] esomeprazole (NEXIUM) 20 MG capsule Take 20 mg by mouth daily at 12 noon. 03/16/2021: pt instructed to d/c since he is taking protonix   [DISCONTINUED] Misc. Devices MISC Rolling walker - ICD10 - Z91.81, R53.81     Patient Active Problem List   Diagnosis Date Noted   Acute on chronic diastolic CHF (congestive heart failure) ([place]) 03/25/2021   Morbid obesity with BMI of 60.0-69.9, adult ([place]) 03/25/2021   UTI (urinary tract infection) 03/24/2021   Lactic acidosis 03/24/2021   Leukocytosis 03/24/2021   Hyperglycemia due to diabetes mellitus ([place]) 03/24/2021   Mixed hyperlipidemia 03/24/2021   Hematuria 03/24/2021   Atrial fibrillation, chronic (HCC) 03/24/2021   Prolonged QT interval 03/24/2021   Iron deficiency anemia 02/21/2021   Constipation 02/21/2021   Rectal bleeding    Chronic blood loss anemia 11/12/2020   Gastrointestinal hemorrhage 10/08/2020   S/P placement of cardiac pacemaker 02/10/2020   Encounter for examination following treatment at hospital 02/10/2020   Anxiety 02/10/2020   DM2 (diabetes mellitus, type 2) ([place]) 02/10/2020   OSA on CPAP    Abnormal liver function 03/16/2019   History of pulmonary embolism 03/16/2019   Paroxysmal atrial fibrillation ([place]) 09/05/2018   COPD (chronic  obstructive pulmonary disease) ([place]) 05/07/2018   Gastroesophageal reflux disease    Pulmonary embolism ([place]) 09/18/2017   Encounter for therapeutic drug monitoring 09/04/2017   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 08/23/2017   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   Pulmonary hypertension ([place])    Chronic periodontitis 07/18/2017   Morbid obesity ([place])    Essential hypertension    Tobacco abuse    Chronic heart failure with preserved ejection fraction (HFpEF) ([place])    Chronic venous insufficiency     Conditions to be addressed/monitored: Depression; Mental Health Concerns   Care Plan : LCSW PLAN OF CARE  Updates made by Deirdre Peer, LCSW since 03/25/2021 12:00 AM     Problem: Unmanaged depression   Priority: High     Long-Range Goal: Reduce symptoms/feelings related to depression   Start Date: 12/21/2020  Expected End Date: 05/20/2021  This Visit's Progress: On track  Recent Progress: On track  Priority: High  Note:   Current Barriers:  Chronic Mental Health needs related to depression Mental Health Concerns  Suicidal Ideation/Homicidal Ideation: No  Clinical Social Work Goal(s):  patient will work with SW  PRN  by telephone or in person to reduce or manage symptoms related to depression  patient will work with SW to address concerns related to depression and treatment options patient will work with PCP and Pharmacist to address needs related to medication management/adjustment of current RX depression regimen to better treat symptoms patient will demonstrate improved health management independence as evidenced by reduction  of PHQ9 score  Interventions: 10/25- still seeking options for pt that are in-network with his insurance  03/07/21- Pt reports [facility] was only able to link him virtual counseling and he wants in personl. CSW validated the in-person preference and will seek options for him.     02/04/21: Pt reports feeling better today. He is tired and feels his CPAP may be causing him to not sleep well. He plans to discuss further with provider.  CSW discussed his mental health with him- he is now willing to consider counseling and agrees to referral being made to [facility]. CSW advised pt to expect a call from [facility] to schedule him.   02/01/21: CSW received call from pt today who reports having a lot of side effects from medications (recently changed/adjusted). Per pt, "I am crying more and over nothing". He also shared that he called the 48 hotline for mental health crisis and was told the line was for people suicidal. Pt states, "I'm not suicidal and would not do that because I love myself too much... and my pets...." he also states that his "faith prevents him from doing that".  Pt has a lot of anxiety today around his medications and reports being told to do different things by the different providers. CSW advised pt he would need to get guidance from clinical staff (PCP, [place], RN) and CSW will alert the team of above.  CSW did remind pt to call CSW anytime and to alwaysnot hesitate to call 911 if emergency arises.    01/14/21: CSW spoke with pt who admits to history of depression. Pt denies any current or past SI/HI, hospitalizations. Pt is currently on Buspar and Celexa for his depression; his PHQ9 score today was 46- CSW discussed with pt the significance/severity of his score and discussed options. Pt declines counseling support. He also declines referral to Psychiatry indicating he use to see "Dr Truman Hayward" who prescribed his meds but now his PCP does. Pt does not wish to be referred back to "Dr Truman Hayward" nor any other Psychiatrist. Pt also shares that the Buspar is 3 x daily- would like to consider other options that may be better coverage of his symptoms and needs; and less pills- "have to remember to take it".  CSW discussed HCPOA with pt; he does not have anyone appointed and states he does  not have a relationship with his family. Encouraged pt to consider completion of Advance Directives- will mail pt packet to review.  Patient interviewed and appropriate assessments performed: PHQ 9 1:1 collaboration with [name], MD regarding development and update of comprehensive plan of care as evidenced by provider attestation and co-signature Patient interviewed and appropriate assessments performed Provided mental health counseling with regard to depression; medications, counseling options  Discussed plans with patient for ongoing care management follow up and provided patient with  direct contact information for care management team Provided education and assistance to client regarding Advanced Directives. Depression screen reviewed , PHQ2/ PHQ9 completed, Solution-Focused Strategies, Active listening / Reflection utilized , Psychoeducation for mental health needs , Reviewed mental health medications with patient and discussed compliance: discussed options for counseling and/or RX adjustments being made, Participation in counseling encouraged , Verbalization of feelings encouraged , Suicidal Ideation/Homicidal Ideation assessed:, Discussed [place] , Discussed referral to [facility] to assist with connecting to mental health provider, and pt declines counseling at this time  Patient Self Care Activities:  Self administers medications as prescribed Attends all scheduled provider appointments Performs ADL's independently Performs IADL's independently Independent living  Patient Coping Strengths:  Hopefulness Self Advocate Able to Communicate Effectively  Patient Self Care Deficits:  Lacks social connections Family not involved/connected  Patient Goals:  -attend counseling appointment on Wednesday, 03/30/21 at 2pm with Maye Hides  ([address])  -call 911 for emergency or [phone] for mental health support/crisis  -discuss medication  adjustment/options with PCP and Pharmacist at PCP office - avoid negative self-talk - develop a personal safety plan - develop a plan to deal with triggers like holidays, anniversaries - have a plan for how to handle bad days - spend time or talk with others at least 2 to 3 times per week - spend time or talk with others every day - watch for early signs of feeling worse   Follow Up Plan: Appointment scheduled for SW follow up with client by phone on: 04/04/21      Follow Up Plan: Appointment scheduled for SW follow up with client by phone on: 04/04/21      Eduard Clos MSW, LCSW Licensed Clinical Social Education officer, environmental Primary Care [phone]

## 2021-03-25 NOTE — Patient Instructions (Signed)
Visit Information  Patient verbalizes understanding of instructions provided today and agrees to view in [place].   Telephone follow up appointment with care management team member scheduled for:04/04/21  Eduard Clos MSW, LCSW Licensed Clinical Social Education officer, environmental Primary Care [phone]

## 2021-03-25 NOTE — Progress Notes (Signed)
Temp to 100.1 and BP 93/48. MD notified. Tylenol and fluid bolus ordered. Patient was notified. Patient refused bolus. States he is already getting fluid and we are going to overload him. MD notified of patient refusal.

## 2021-03-25 NOTE — Progress Notes (Signed)
PROGRESS NOTE  Reginald [name] MLY:[id number] DOB: 11/25/1970 DOA: 03/24/2021 PCP: [name], MD  Brief History:  50 year old male with a history of diabetes mellitus type 2, hypertension, hyperlipidemia, paroxysmal atrial fibrillation, OSA on CPAP, diastolic CHF, bioprosthetic AVR, and morbid obesity presenting with dysuria and hematuria that began on 03/24/2021.  He stated that he has some subjective fevers and chills.  He had some mild lower abdominal pain.  He denied any nausea, vomiting, diarrhea.  Notably, the patient states that he has been more short of breath in the past couple days.  He has had more dyspnea on exertion.  He denies any chest pain, coughing, hemoptysis, hematochezia, melena, headache, neck pain. In the emergency department, the patient had low-grade temperature 100.1 F.  He initially had low blood pressure of 86/47.  Oxygen saturation was 97% on room air.  BMP showed sodium 134, potassium 4.2, CO2 31, serum creatinine 1.0.  WBC 20.7, hemoglobin 12.6, platelets 210,000.  Lactic acid peaked at 3.3.  The patient was fluid resuscitated.  He was started on ceftriaxone.   Assessment/Plan: Severe Sepsis -Present on admission with dyspnea, leukocytosis, and elevated lactate -Lactic acid peaked 3.3 -Urinary source -UA 11-20 WBC -Continue ceftriaxone pending urine culture data  Hematuria/UTI -CT renal stone protocol--no hydronephrosis, no ureteral stones, urinary bladder unremarkable -Continue ceftriaxone pending urine culture data -Holding rivaroxaban temporarily  Acute on chronic diastolic CHF -Start IV furosemide -07/28/2020 echo--EF 60 to 65%, not optimal to evaluate WMA, mildly reduced RV, trivial MR/TR, status post AVR bioprosthesis  Paroxysmal atrial fibrillation -Currently in sinus rhythm -Holding rivaroxaban temporarily due to hematuria  Diabetes mellitus type 2 -NovoLog sliding scale -11/12/2020 hemoglobin A1c 5.4 -hold metformin, glipizide and  semaglutide  Morbid obesity -BMI 66.9 -Lifestyle modification  OSA -Continue nightly CPAP  Hyperlipidemia -Continue statin  Severe aortic Stenosis S/p AVR with bioprosthetic valve & repair ascending thoracic aortic aneurysm  CHB -s/p [employer] PPM placement in 08/2017 and recent device interrogation earlier this month showed his pacemaker was functioning normally.         Status is: Inpatient  Remains inpatient appropriate because: severity of illness requiring IV lasix and IV abx        Family Communication:   no Family at bedside  Consultants:  none  Code Status:  FULL   DVT Prophylaxis:  SCDs   Procedures: As Listed in Progress Note Above  Antibiotics: Ceftriaxone 11/3>>      Subjective: Patient denies fevers, chills, headache, chest pain,nausea, vomiting, diarrhea, abdominal pain, dysuria, hematuria, hematochezia, and melena.   Objective: Vitals:   03/25/21 0500 03/25/21 0733 03/25/21 0825 03/25/21 0834  BP: 97/70 94/60    Pulse: 88 91  88  Resp:  18    Temp:  98.2 F (36.8 C)    TempSrc:      SpO2:  95% 94% 94%  Height:        Intake/Output Summary (Last 24 hours) at 03/25/2021 0837 Last data filed at 03/25/2021 0324 Gross per 24 hour  Intake 1581.8 ml  Output 300 ml  Net 1281.8 ml   Weight change:  Exam:  General:  Pt is alert, follows commands appropriately, not in acute distress HEENT: No icterus, No thrush, No neck mass, [place]/AT Cardiovascular: RRR, S1/S2, no rubs, no gallops Respiratory: bibasilar crackles. Abdomen: Soft/+BS, non tender, non distended, no guarding Extremities: 1 + LE edema, No lymphangitis, No petechiae, No rashes, no synovitis  Data Reviewed: I have personally reviewed following labs and imaging studies Basic Metabolic Panel: Recent Labs  Lab 03/24/21 1431 03/25/21 0539  NA 134* 135  K 4.2 3.4*  CL 91* 93*  CO2 31 30  GLUCOSE 132* 127*  BUN 27* 37*  CREATININE 1.80* 2.17*  CALCIUM 9.0 8.3*   MG  --  2.0  PHOS  --  5.0*   Liver Function Tests: Recent Labs  Lab 03/25/21 0539  AST 32  ALT 20  ALKPHOS 75  BILITOT 1.9*  PROT 7.5  ALBUMIN 3.2*   No results for input(s): LIPASE, AMYLASE in the last 168 hours. No results for input(s): AMMONIA in the last 168 hours. Coagulation Profile: Recent Labs  Lab 03/25/21 0539  INR 1.3*   CBC: Recent Labs  Lab 03/24/21 1431 03/25/21 0539  WBC 20.7* 20.6*  NEUTROABS 17.0*  --   HGB 12.6* 11.1*  HCT 41.0 36.7*  MCV 83.3 83.4  PLT 310 239   Cardiac Enzymes: No results for input(s): CKTOTAL, CKMB, CKMBINDEX, TROPONINI in the last 168 hours. BNP: Invalid input(s): POCBNP CBG: Recent Labs  Lab 03/24/21 1632 03/24/21 2144 03/25/21 0733  GLUCAP 148* 195* 178*   HbA1C: Recent Labs    03/24/21 1431  HGBA1C 6.4*   Urine analysis:    Component Value Date/Time   COLORURINE AMBER (A) 03/24/2021 1505   APPEARANCEUR CLOUDY (A) 03/24/2021 1505   LABSPEC 1.018 03/24/2021 1505   PHURINE 7.0 03/24/2021 1505   GLUCOSEU 50 (A) 03/24/2021 1505   HGBUR LARGE (A) 03/24/2021 1505   BILIRUBINUR NEGATIVE 03/24/2021 1505   BILIRUBINUR negative 03/10/2020 0926   BILIRUBINUR neg 07/18/2018 0855   KETONESUR NEGATIVE 03/24/2021 1505   PROTEINUR >=300 (A) 03/24/2021 1505   UROBILINOGEN 0.2 03/10/2020 0926   UROBILINOGEN 2.0 (H) 03/08/2019 1123   NITRITE NEGATIVE 03/24/2021 1505   LEUKOCYTESUR MODERATE (A) 03/24/2021 1505   Sepsis Labs: @LABRCNTIP (procalcitonin:4,lacticidven:4) ) Recent Results (from the past 240 hour(s))  Blood culture (routine x 2)     Status: None (Preliminary result)   Collection Time: 03/24/21  5:35 PM   Specimen: BLOOD  Result Value Ref Range Status   Specimen Description BLOOD BLOOD LEFT FOREARM  Final   Special Requests   Final    BOTTLES DRAWN AEROBIC AND ANAEROBIC Blood Culture results may not be optimal due to an inadequate volume of blood received in culture bottles Performed at [facility], [address] [place], [place] [address]    Culture PENDING  Incomplete   Report Status PENDING  Incomplete  Blood culture (routine x 2)     Status: None (Preliminary result)   Collection Time: 03/24/21  5:35 PM   Specimen: BLOOD  Result Value Ref Range Status   Specimen Description BLOOD LEFT ANTECUBITAL  Final   Special Requests   Final    BOTTLES DRAWN AEROBIC AND ANAEROBIC Blood Culture results may not be optimal due to an excessive volume of blood received in culture bottles Performed at [facility], [address], [place], [place] [address]    Culture PENDING  Incomplete   Report Status PENDING  Incomplete  Resp Panel by RT-PCR (Flu A&B, Covid) Nasopharyngeal Swab     Status: None   Collection Time: 03/24/21  6:50 PM   Specimen: Nasopharyngeal Swab; Nasopharyngeal(NP) swabs in vial transport medium  Result Value Ref Range Status   SARS Coronavirus 2 by RT PCR NEGATIVE NEGATIVE Final    Comment: (NOTE) SARS-CoV-2 target nucleic acids are NOT  DETECTED.  The SARS-CoV-2 RNA is generally detectable in upper respiratory specimens during the acute phase of infection. The lowest concentration of SARS-CoV-2 viral copies this assay can detect is 138 copies/mL. A negative result does not preclude SARS-Cov-2 infection and should not be used as the sole basis for treatment or other patient management decisions. A negative result may occur with  improper specimen collection/handling, submission of specimen other than nasopharyngeal swab, presence of viral mutation(s) within the areas targeted by this assay, and inadequate number of viral copies(<138 copies/mL). A negative result must be combined with clinical observations, patient history, and epidemiological information. The expected result is Negative.  Fact Sheet for Patients:  EntrepreneurPulse.com.au  Fact Sheet for Healthcare Providers:  IncredibleEmployment.be  This test is no t yet  approved or cleared by the [place] FDA and  has been authorized for detection and/or diagnosis of SARS-CoV-2 by FDA under an Emergency Use Authorization (EUA). This EUA will remain  in effect (meaning this test can be used) for the duration of the COVID-19 declaration under Section 564(b)(1) of the Act, 21 U.S.C.section 360bbb-3(b)(1), unless the authorization is terminated  or revoked sooner.       Influenza A by PCR NEGATIVE NEGATIVE Final   Influenza B by PCR NEGATIVE NEGATIVE Final    Comment: (NOTE) The Xpert Xpress SARS-CoV-2/FLU/RSV plus assay is intended as an aid in the diagnosis of influenza from Nasopharyngeal swab specimens and should not be used as a sole basis for treatment. Nasal washings and aspirates are unacceptable for Xpert Xpress SARS-CoV-2/FLU/RSV testing.  Fact Sheet for Patients: EntrepreneurPulse.com.au  Fact Sheet for Healthcare Providers: IncredibleEmployment.be  This test is not yet approved or cleared by the [place] FDA and has been authorized for detection and/or diagnosis of SARS-CoV-2 by FDA under an Emergency Use Authorization (EUA). This EUA will remain in effect (meaning this test can be used) for the duration of the COVID-19 declaration under Section 564(b)(1) of the Act, 21 U.S.C. section 360bbb-3(b)(1), unless the authorization is terminated or revoked.  Performed at [facility], [address] [place], [place] [address]      Scheduled Meds:  atorvastatin  40 mg Oral Daily   ferrous sulfate  325 mg Oral Daily   insulin aspart  0-15 Units Subcutaneous TID WC   insulin aspart  0-5 Units Subcutaneous QHS   pantoprazole  40 mg Oral Daily   umeclidinium bromide  1 puff Inhalation Daily   Continuous Infusions:  cefTRIAXone (ROCEPHIN)  IV      Procedures/Studies: DG Chest Portable 1 View  Result Date: 03/24/2021 CLINICAL DATA:  Hematuria with history of asthma. EXAM: PORTABLE CHEST 1  VIEW COMPARISON:  January 30, 2021 FINDINGS: Multiple sternal wires are seen. A dual lead AICD is noted. There is no evidence of acute infiltrate, pleural effusion or pneumothorax. Moderate to marked severity enlargement of the cardiac silhouette is noted. The visualized skeletal structures are unremarkable. IMPRESSION: 1. Moderate to marked severity cardiomegaly without acute or active cardiopulmonary disease. Electronically Signed   By: Virgina Norfolk M.D.   On: 03/24/2021 18:01   CT Renal Stone Study  Result Date: 03/24/2021 CLINICAL DATA:  Hematuria, flank pain EXAM: CT ABDOMEN AND PELVIS WITHOUT CONTRAST TECHNIQUE: Multidetector CT imaging of the abdomen and pelvis was performed following the standard protocol without IV contrast. COMPARISON:  11/12/2020 FINDINGS: Lower chest: Lung bases are clear. No effusions. Heart is normal size. Pacer wires noted in the right heart. Hepatobiliary: Diffuse fatty infiltration of the liver.  Liver is enlarged with a craniocaudal length of 28 cm. Small stone layering in the gallbladder. No biliary ductal dilatation. No focal hepatic abnormality. Pancreas: No focal abnormality or ductal dilatation. Spleen: Enlarged with a craniocaudal length of 16.5 cm. Adrenals/Urinary Tract: No adrenal abnormality. No focal renal abnormality. No stones or hydronephrosis. Urinary bladder is unremarkable. Stomach/Bowel: Stomach, large and small bowel grossly unremarkable. Vascular/Lymphatic: Aortic atherosclerosis. No evidence of aneurysm or adenopathy. Reproductive: No visible focal abnormality. Other: No free fluid or free air. Musculoskeletal: No acute bony abnormality. IMPRESSION: Hepatosplenomegaly.  Hepatic steatosis. Aortic atherosclerosis. Electronically Signed   By: Rolm Baptise M.D.   On: 03/24/2021 18:25    Orson Eva, DO  [facility]  If 7PM-7AM, please contact night-coverage [email or web address] Password [email or web address] 03/25/2021, 8:37 AM   LOS: 1 day

## 2021-03-25 NOTE — Care Management Important Message (Signed)
Important Message  Patient Details  Name: Reginald [name] MRN: [id number] Date of Birth: 04-Dec-1970   Medicare Important Message Given:  Yes     Tommy Medal 03/25/2021, 10:46 AM

## 2021-03-26 LAB — URINE CULTURE: Culture: NO GROWTH

## 2021-03-29 ENCOUNTER — Ambulatory Visit: Payer: Medicare HMO | Admitting: Pharmacist

## 2021-03-29 ENCOUNTER — Other Ambulatory Visit: Payer: Self-pay

## 2021-03-29 DIAGNOSIS — I5032 Chronic diastolic (congestive) heart failure: Secondary | ICD-10-CM

## 2021-03-29 DIAGNOSIS — I480 Paroxysmal atrial fibrillation: Secondary | ICD-10-CM

## 2021-03-29 DIAGNOSIS — J449 Chronic obstructive pulmonary disease, unspecified: Secondary | ICD-10-CM

## 2021-03-29 DIAGNOSIS — I10 Essential (primary) hypertension: Secondary | ICD-10-CM

## 2021-03-29 DIAGNOSIS — E1169 Type 2 diabetes mellitus with other specified complication: Secondary | ICD-10-CM

## 2021-03-29 DIAGNOSIS — F339 Major depressive disorder, recurrent, unspecified: Secondary | ICD-10-CM

## 2021-03-29 DIAGNOSIS — F419 Anxiety disorder, unspecified: Secondary | ICD-10-CM

## 2021-03-29 LAB — CULTURE, BLOOD (ROUTINE X 2)
Culture: NO GROWTH
Culture: NO GROWTH

## 2021-03-29 NOTE — Chronic Care Management (AMB) (Signed)
Chronic Care Management Pharmacy Note  03/29/2021 Name:  Reginald [name] MRN:  [id number] DOB:  07/18/1970  Summary:  Patient assistance applications completed today in office for 2023 renewal for Jardiance, Spiriva, and Rybelsus. All completed applications were faxed. Patient reports no more blood in urine. Urine culture had no growth. Patient almost done with course of antibiotics. Fever resolved yesterday. Patient would like outpatient urology consult to check prostate and make sure there is no urinary obstruction.  Patient reports drinking 3-4 zero sugar Gatorades per day. Encouraged him to limit/stop due to high sodium intake. Lisinopril recently stopped at discharge from hospital. Patient reports not taking as instructed.  Patient will see behavioral health tomorrow Blood glucose has improved to mostly 150-160s with restarting glipizide.  Patient reports "shocks" a few times per day as well as continued issues with peripheral neuropathy. May be reasonable to consider increasing dose of gabapentin cautiously with renal function.   Subjective: Reginald [name] is an 50 y.o. year old male who is a primary patient of [name], MD.  The CCM team was consulted for assistance with disease management and care coordination needs.    Engaged with patient face to face for follow up visit in response to provider referral for pharmacy case management and/or care coordination services.   Consent to Services:  The patient was given information about Chronic Care Management services, agreed to services, and gave verbal consent prior to initiation of services.  Please see initial visit note for detailed documentation.   Patient Care Team: [name], MD as PCP - General (Internal Medicine) Thompson Grayer, MD as PCP - Electrophysiology (Cardiology) Josue Hector, MD as PCP - Cardiology (Cardiology) Kassie Mends, RN as [place] Management [place], Ranae Pila, LCSW as  Social Worker (Licensed Clinical Social Worker) Beryle Lathe, [facility] (Pharmacist)  Objective:  Lab Results  Component Value Date   CREATININE 2.17 (H) 03/25/2021   CREATININE 1.80 (H) 03/24/2021   CREATININE 1.55 (H) 02/08/2021    Lab Results  Component Value Date   HGBA1C 6.4 (H) 03/24/2021   Last diabetic Eye exam: No results found for: HMDIABEYEEXA  Last diabetic Foot exam: No results found for: HMDIABFOOTEX      Component Value Date/Time   CHOL 97 03/25/2021 0539   CHOL 123 06/10/2020 0837   TRIG 138 03/25/2021 0539   HDL 27 (L) 03/25/2021 0539   HDL 25 (L) 06/10/2020 0837   CHOLHDL 3.6 03/25/2021 0539   VLDL 28 03/25/2021 0539   LDLCALC 42 03/25/2021 0539   LDLCALC 54 06/10/2020 0837    Hepatic Function Latest Ref Rng & Units 03/25/2021 11/24/2020 11/15/2020  Total Protein 6.5 - 8.1 g/dL 7.5 7.9 8.0  Albumin 3.5 - 5.0 g/dL 3.2(L) 4.3 3.6  AST 15 - 41 U/L 32 41(H) 32  ALT 0 - 44 U/L '20 27 23  ' Alk Phosphatase 38 - 126 U/L 75 98 76  Total Bilirubin 0.3 - 1.2 mg/dL 1.9(H) 0.7 1.4(H)  Bilirubin, Direct 0.0 - 0.2 mg/dL - - -    Lab Results  Component Value Date/Time   TSH 4.060 10/04/2020 09:43 AM   TSH 5.240 (H) 06/10/2020 08:37 AM   FREET4 1.30 10/04/2020 09:43 AM   FREET4 0.79 (L) 05/03/2018 02:04 PM    CBC Latest Ref Rng & Units 03/25/2021 03/24/2021 02/08/2021  WBC 4.0 - 10.5 K/uL 20.6(H) 20.7(H) 10.1  Hemoglobin 13.0 - 17.0 g/dL 11.1(L) 12.6(L) 11.1(L)  Hematocrit 39.0 -  52.0 % 36.7(L) 41.0 36.3(L)  Platelets 150 - 400 K/uL 239 310 256    No results found for: VD25OH  Clinical ASCVD: No  The ASCVD Risk score (Arnett DK, et al., 2019) failed to calculate for the following reasons:   The valid total cholesterol range is 130 to 320 mg/dL    Social History   Tobacco Use  Smoking Status Former   Packs/day: 2.00   Years: 16.00   Pack years: 32.00   Types: Cigarettes   Quit date: 08/23/2017   Years since quitting: 3.6  Smokeless Tobacco Never    BP Readings from Last 3 Encounters:  03/25/21 94/60  02/21/21 139/81  02/18/21 117/72   Pulse Readings from Last 3 Encounters:  03/25/21 88  02/21/21 79  02/18/21 78   Wt Readings from Last 3 Encounters:  03/14/21 (!) 493 lb (223.6 kg)  02/21/21 (!) 486 lb (220.4 kg)  02/08/21 (!) 493 lb (223.6 kg)    Assessment: Review of patient past medical history, allergies, medications, health status, including review of consultants reports, laboratory and other test data, was performed as part of comprehensive evaluation and provision of chronic care management services.   SDOH:  (Social Determinants of Health) assessments and interventions performed:    CCM Care Plan  No Known Allergies  Medications Reviewed Today     Reviewed by Beryle Lathe, [facility] (Pharmacist) on 03/29/21 at [place] List Status: <None>   Medication Order Taking? Sig Documenting Provider Last Dose Status Informant  acetaminophen (TYLENOL) 325 MG tablet [id number] Yes Take 650 mg by mouth every 6 (six) hours as needed for mild pain. [provider] Taking Active Self  albuterol (PROVENTIL) (2.5 MG/3ML) 0.083% nebulizer solution [id number] Yes INHALE 1 VIAL VIA NEBULIZER EVERY 6 HOURS AS NEEDED FOR WHEEZING OR SHORTNESS OF BREATH [name], MD Taking Active Self  albuterol (VENTOLIN HFA) 108 (90 Base) MCG/ACT inhaler [id number] Yes INHALE 2 PUFFS INTO THE LUNGS EVERY 6 (SIX) HOURS AS NEEDED FOR WHEEZING OR SHORTNESS OF BREATH. [name], MD Taking Active Self           Med Note Lia Hopping, Toniann Fail   Fri Jan 21, 2021  8:16 AM)    allopurinol (ZYLOPRIM) 300 MG tablet [id number] Yes TAKE 1 TABLET EVERY DAY [name], MD Taking Active Self  ammonium lactate (AMLACTIN) 12 % lotion [id number] Yes Apply 1 application topically as needed for dry skin. Felipa Furnace, DPM Taking Active Self  atorvastatin (LIPITOR) 40 MG tablet [id number] Yes TAKE 1 TABLET EVERY DAY [name], MD Taking Active  Self  Blood Glucose Monitoring Suppl (TRUE METRIX METER) w/Device KIT [id number]  USE AS DIRECTED [name], MD  Active Self  buPROPion (WELLBUTRIN XL) 300 MG 24 hr tablet [id number] Yes Take 1 tablet (300 mg total) by mouth daily. [name], MD Taking Active Self  cefdinir (OMNICEF) 300 MG capsule [id number] Yes Take 1 capsule (300 mg total) by mouth 2 (two) times daily. Orson Eva, MD Taking Active   diclofenac Sodium (VOLTAREN) 1 % GEL [id number] Yes Apply 2 g topically 4 (four) times daily. Mordecai Rasmussen, MD Taking Active Self  docusate sodium (COLACE) 100 MG capsule [id number] Yes Take 2 capsules (200 mg total) by mouth 2 (two) times daily. Thurnell Lose, MD Taking Active Self           Med Note Celesta Aver, [facility] Ronald Lobo Feb 21, 2021  3:06 PM)  2 once a day  empagliflozin (JARDIANCE) 10 MG TABS tablet [id number] Yes Take 1 tablet (10 mg total) by mouth daily before breakfast. Ahmed Prima Fransisco Hertz, PA-C Taking Active Self           Med Note Jim Like Mar 03, 2021  9:34 AM) Gets from patient assistance ([employer])  FEROSUL 325 (65 Fe) MG tablet [id number] Yes TAKE 1 TABLET EVERY DAY [name], MD Taking Active Self  fexofenadine (ALLEGRA) 180 MG tablet [id number] Yes Take 180 mg by mouth daily. [provider] Taking Active Self  gabapentin (NEURONTIN) 100 MG capsule [id number] Yes TAKE 1 CAPSULE IN THE MORNING AND TAKE 2 CAPSULES IN THE EVENING [name], MD Taking Active Self  glipiZIDE (GLUCOTROL XL) 5 MG 24 hr tablet [id number] Yes Take 1 tablet (5 mg total) by mouth daily with breakfast. [name], MD Taking Active Self  hydrOXYzine (ATARAX/VISTARIL) 25 MG tablet [id number] Yes Take 1 tablet (25 mg total) by mouth every 8 (eight) hours as needed. [name], MD Taking Active Self  Krill Oil 350 MG CAPS [id number] Yes Take 1 capsule by mouth in the morning and at bedtime.  [provider] Taking Active Self           Med Note  Lia Hopping, MINDY L   Fri Jan 21, 2021  8:16 AM)    lidocaine-prilocaine (EMLA) cream [id number] Yes Apply 1 application topically as needed. Chase Picket, MD Taking Active Self  metFORMIN (GLUCOPHAGE) 850 MG tablet [id number] Yes TAKE 1 TABLET TWICE DAILY WITH MEALS [name], MD Taking Active Self  metolazone (ZAROXOLYN) 5 MG tablet [id number] Yes TAKE 1 TABLET (5 MG TOTAL) BY MOUTH THREE TIMES A WEEK. Thompson Grayer, MD Taking Active Self           Med Note Jim Like Mar 03, 2021  9:35 AM) Has only been using ~1 time per week recently to maintain fluid  metoprolol tartrate (LOPRESSOR) 100 MG tablet [id number] Yes TAKE 1 TABLET TWICE DAILY Josue Hector, MD Taking Active Self  pantoprazole (PROTONIX) 40 MG tablet [id number] Yes Take 1 tablet (40 mg total) by mouth daily. [name], MD Taking Active Self  polyethylene glycol (MIRALAX) 17 g packet [id number] Yes Take 17 g by mouth daily as needed. Thurnell Lose, MD Taking Active Self  potassium chloride SA (KLOR-CON) 20 MEQ tablet [id number] Yes TAKE 3 TABS DAILY AND TAKE AN ADDITIONAL 1 TAB ON THE DAY YOU TAKE YOUR WEEKLY METOLAZONE DOSE Ahmed Prima, Fransisco Hertz, PA-C Taking Active Self  rivaroxaban (XARELTO) 20 MG TABS tablet [id number] Yes TAKE 1 Tablet BY MOUTH ONCE EVERY DAY WITH SUPPER Josue Hector, MD Taking Active Self  Semaglutide (RYBELSUS) 14 MG TABS [id number] Yes Take 14 mg by mouth in the morning. [name], MD Taking Active Self           Med Note Jim Like Mar 03, 2021  9:35 AM) Gets from patient assistance ([employer]ovo[employer])  terbinafine (LAMISIL) 250 MG tablet [id number] Yes Take 1 tablet by mouth once daily [name], MD Taking Active Self  Tiotropium Bromide Monohydrate (SPIRIVA RESPIMAT) 2.5 MCG/ACT AERS [id number] Yes Inhale 2 puffs into the lungs daily. [name], MD Taking Active Self           Med Note Jim Like Mar 03, 2021  9:34 AM) Marguerite Olea from  patient assistance ([employer])  torsemide (DEMADEX) 20 MG tablet [id number] Yes Take 2 tablets (40 mg total) by mouth 2 (two) times daily. Erma Heritage, [place] Taking Active Self  TRUE METRIX BLOOD GLUCOSE TEST test strip [id number]  TEST ONE TIME DAILY FOR DIABETES [name], MD  Active Self  TRUEplus Lancets 33G MISC [id number]  USE TO TEST ONE TIME DAILY FOR DIABETES [name], MD  Active Self            Patient Active Problem List   Diagnosis Date Noted   Acute on chronic diastolic CHF (congestive heart failure) ([place]) 03/25/2021   Morbid obesity with BMI of 60.0-69.9, adult ([place]) 03/25/2021   UTI (urinary tract infection) 03/24/2021   Lactic acidosis 03/24/2021   Leukocytosis 03/24/2021   Hyperglycemia due to diabetes mellitus ([place]) 03/24/2021   Mixed hyperlipidemia 03/24/2021   Hematuria 03/24/2021   Atrial fibrillation, chronic (HCC) 03/24/2021   Prolonged QT interval 03/24/2021   Iron deficiency anemia 02/21/2021   Constipation 02/21/2021   Rectal bleeding    Chronic blood loss anemia 11/12/2020   Gastrointestinal hemorrhage 10/08/2020   S/P placement of cardiac pacemaker 02/10/2020   Encounter for examination following treatment at hospital 02/10/2020   Anxiety 02/10/2020   DM2 (diabetes mellitus, type 2) ([place]) 02/10/2020   OSA on CPAP    Abnormal liver function 03/16/2019   History of pulmonary embolism 03/16/2019   Paroxysmal atrial fibrillation (HCC) 09/05/2018   COPD (chronic obstructive pulmonary disease) ([place]) 05/07/2018   Gastroesophageal reflux disease    Pulmonary embolism ([place]) 09/18/2017   Encounter for therapeutic drug monitoring 09/04/2017   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 08/23/2017   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   Pulmonary hypertension (HCC)    Chronic periodontitis 07/18/2017   Morbid obesity ([place])    Essential hypertension    Tobacco abuse    Chronic  heart failure with preserved ejection fraction (HFpEF) (HCC)    Chronic venous insufficiency     Immunization History  Administered Date(s) Administered   Influenza Inj Mdck Quad Pf 02/27/2019   Influenza,inj,Quad PF,6+ Mos 02/10/2020   Janssen (J&J) SARS-COV-2 Vaccination 09/16/2019   PFIZER(Purple Top)SARS-COV-2 Vaccination 03/15/2020   Pneumococcal Polysaccharide-23 03/17/2019    Conditions to be addressed/monitored: Atrial Fibrillation, CHF, HTN, COPD, DMII, Anxiety, and Depression  Care Plan : Medication Management  Updates made by Beryle Lathe, [place] since 03/29/2021 12:00 AM     Problem: COPD, HFpEF, HTN, T2DM, Afib, anxiety/depression   Priority: High  Onset Date: 12/28/2020     Long-Range Goal: Disease Progression Prevention   Start Date: 12/28/2020  Expected End Date: 03/28/2021  Recent Progress: On track  Priority: High  Note:   Current Barriers:  Unable to independently monitor therapeutic efficacy  Pharmacist Clinical Goal(s):  Over the next 90 days, patient will achieve adherence to monitoring guidelines and medication adherence to achieve therapeutic efficacy through collaboration with PharmD and provider.   Interventions: 1:1 collaboration with [name], MD regarding development and update of comprehensive plan of care as evidenced by provider attestation and co-signature Inter-disciplinary care team collaboration (see longitudinal plan of care) Comprehensive medication review performed; medication list updated in electronic medical record  Diabetes: Current medications: metformin 850 mg by mouth twice daily, glipizide XL 5 mg by mouth with breakfast, Jardiance 10 mg by mouth daily (receives from [employer]), and semaglutide (Rybelsus) (receives from [employer]) 14 mg by mouth daily  Intolerances: none Taking medications as directed: yes Side effects thought to be attributed to current medication regimen: GI upset with metformin but ok with  continuing for now given benefits Patient denies recent hypoglycemia Current meal patterns: breakfast: eggs, oatmeal, chicken sandwhich, and baked beans ; lunch:  ribeye and [employer] Chicken Breast in air fryer ; dinner:  leftovers from lunch ; snacks: none; drinks: water, regular soda, milk, and sports drinks Has noticed decreased appetite since starting Rybelsus. Is not having any nausea or vomiting Current exercise: none Controlled; Most recent A1c at goal of <7% per ADA guidelines Current glucose readings over last 2 weeks: mostly 150-160 fasting and at bedtime Medication: Identify diabetes medication and when best taken Monitoring: target blood sugar range, when to test Hypoglycemia: I have discussed with the patient how to treat hypoglycemia by the rule of 15; eat/drink 15g of sugar in the form of glucose tabs, 4 ounces of juice or soda and recheck fingerstick glucose in 15 minutes. Chocolate bars and ice cream should be avoided because fat delays carbohydrate digestion and absorption. Retreat if glucose remains low. Driving should cease until glucose is normal. Continue metformin 850 mg by mouth twice daily, glipizide XL 5 mg by mouth with breakfast, Jardiance 10 mg by mouth daily and semaglutide (Rybelsus) 14 mg by mouth at least 30 minutes before the first food, beverage, or other oral medications of the day with no more than 4 ounces of plain water only Instructed to monitor blood sugars once a day at the following times: fasting (at least 8 hours since last food consumption), bedtime, and whenever patient experiences symptoms of hypo/hyperglycemia Encouraged regular aerobic exercise with a goal of 30 minutes five times per week (150 minutes per week)  Hypertension: Current medications: metoprolol tartrate 100 mg by mouth twice daily Lisinopril recently discontinued upon hospital discharge  Patient also takes diuretics (torsemide + metolazone as needed) He has CPAP machine Intolerances:  none Taking medications as directed: yes Side effects thought to be attributed to current medication regimen: no Reports some orthostatic hypotension but knows to get up slowly Current exercise: none Recent home blood pressure readings: 130s/80s Blood pressure under fair control. Factors affecting control of BP include high salt intake, elevated BMI, lack of exercise, sleep apnea, and volume overload. Blood pressure is at goal of <130/80 mmHg per 2017 AHA/ACC guidelines. Continue diuretics per cardiology instructions Consider restarting lisinopril 20 mg by mouth once daily once renal function stabilizes  Encourage dietary sodium restriction/DASH diet. Recommended that he stop drinking zero sugar Gatorade due to high sodium Recommend regular aerobic exercise Recommend home blood pressure monitoring, to bring results in next visit Discussed need for and importance of continued work on weight loss Discussed need for medication compliance Reviewed risks of hypertension, principles of treatment and consequences of untreated hypertension  Heart failure with preserved ejection fraction (LVEF >50% with evidence of spontaneous or provokable increased LV filling pressures): Appropriately managed Has pacemaker Current treatment: metoprolol tartrate 100 mg by mouth twice daily, and empagliflozin (Jardiance) 10 mg by mouth once daily Lisinopril recently discontinued upon hospital discharge  Diuretic therapy: Torsemide twice daily and metolazone as needed for fluid  Stage C (Symptomatic heart failure)/NYHA Class III (Marked limitation of physical activity. Comfortable at rest. Less than ordinary activity causes fatigue, palpitation, or dyspnea) Most recent echocardiogram was in March 2022. LVEF was stable at 60-65%. BNP: 67 (01/30/21) Current home vitals: BP 130s/80s Current home weights: mostly stable 479-483 over last few days Reports shortness  of breath with activity or when lying down, fatigue and  weakness, swelling in the legs, ankles and feet, and reduced ability to exercise. Denies persistent cough or wheezing, nausea and lack of appetite, and chest pain Continue Jardiance 10 mg by mouth daily and metoprolol tartrate 100 mg by mouth twice daily Consider restarting lisinopril 20 mg by mouth once daily once renal function stabilizes  Follow cardiology recommendations regarding torsemide and metolazone Continue to watch serum creatinine and potassium levels closely Encourage dietary sodium restriction (<3 g/day) Educated on the importance of weighing daily. Patient aware to contact cardiology/primary care team if weight gain >3 lbs in 1 day or >5 lbs in 1 week Recommend home blood pressure monitoring, to bring results in next visit Discussed need for and importance of continued work on weight loss Discussed need for medication compliance Recommend restricted fluid intake (eg, 2 L/day)  Chronic Obstructive Pulmonary Disease: Uncontrolled - complicated by possible nasal obstruction. ENT referral in process to investigate. Plans to see Dr. Benjamine Mola in [place] soon.  Patient now has CPAP machine but needs to have it read. Appointment scheduled for 04/01/21.  Stopped smoking 08/23/2017; smoked for 30 years (1 PPD) Current treatment: albuterol metered dose (ProAir, Ventolin, Proventil) 1 puff by mouth as needed for shortness of breath and tiotropium (Spiriva Respimat) 2 inhalations once daily (Receives from [employer]) Patient reports only using albuterol a couple times per week but often sounds out of breath which is likely complicated by his heart failure GOLD Classification: unknown CAT score (12/28/20): 23 Most recent Pulmonary Function Testing: N/A 0 exacerbations requiring treatment in the last 6 months  Current oxygen requirements: none Continue Spiriva 2 puffs once daily and albuterol as needed for shortness of breath   Patient was instructed on the appropriate inhaler technique for Spiriva  Respimat on 02/08/21  Atrial Fibrillation: Controlled Current rate control: metoprolol tartrate 100 mg by mouth twice daily Most recent ECG: normal sinus Anticoagulation: rivaroxaban (Xarelto) 20 mg by mouth once daily Denies signs and symptoms of bleeding CHADS2VASc score: 3 (CHF, hypertension, and diabetes) Prior ablation: '[]'  Yes  '[x]'  No Home blood pressure: 130s/80s Home heart rate: unknown Continue metoprolol  Continue rivaroxaban (Xarelto) 20 mg by mouth once daily Encouraged regular physical activity for general health benefits Recommend home blood pressure and heart rate monitoring, to bring results in next visit Discussed need for medication compliance Patient may qualify for patient assistance for Xarelto after he has spent at least 4% of his gross annual income at the pharmacy.  Anxiety/Depression: Improving per patient. Talking to his providers helps him. Has not cried recently which is an improvement but still has outbursts of anger. Has behavioral health appointment tomorrow Current medication: bupropion XL 300 mg by mouth daily and hydroxyzine 25 mg by mouth every 8 hours  Patient reports that hydroxyzine helps him a lot but makes him drowsy at times.  Patient Goals/Self-Care Activities Over the next 90 days, patient will:  take medications as prescribed check glucose twice daily (fasting and bedtime), document, and provide at future appointments check blood pressure at least once daily, document, and provide at future appointments weigh daily, and contact provider if weight gain of 3 lbs in a day or more than 5 lbs in a week  Follow Up Plan: Face to Face appointment with care management team member scheduled for:  04/19/21      Medication Assistance:  [place] for Jardiance and Spiriva. NovoNordisk for Rybelsus  Patient's preferred pharmacy is:  [facility] [address], [place] [address] Phone: [phone] Fax:  [facility] [place], [place] [address] Phone: [phone] Fax: [place], [place]. [place] [address] Phone: [phone] Fax: [phone]  Follow Up:  Patient agrees to Care Plan and Follow-up.  Plan: Face to Face appointment with care management team member scheduled for: 04/19/21  Kennon Holter, PharmD Clinical Pharmacist [facility] [phone]

## 2021-03-29 NOTE — Patient Instructions (Addendum)
[name],  It was great to talk to you today!  Please call me with any questions or concerns.   Visit Information  Patient Goals/Self-Care Activities Over the next 90 days, patient will:  Take medications as prescribed Check glucose twice daily (fasting and bedtime), document, and provide at future appointments Check blood pressure at least once daily, document, and provide at future appointments Weigh daily, and contact provider if weight gain of 3 lbs in a day or more than 5 lbs in a week  Patient verbalizes understanding of instructions provided today and agrees to view in [place].   Face to Face appointment with care management team member scheduled for: 04/19/21  Kennon Holter, PharmD Clinical Pharmacist [facility] [phone]

## 2021-03-30 ENCOUNTER — Other Ambulatory Visit: Payer: Self-pay | Admitting: Internal Medicine

## 2021-03-30 ENCOUNTER — Encounter (HOSPITAL_COMMUNITY): Payer: Self-pay

## 2021-03-30 ENCOUNTER — Ambulatory Visit (INDEPENDENT_AMBULATORY_CARE_PROVIDER_SITE_OTHER): Payer: Medicare HMO | Admitting: Clinical

## 2021-03-30 DIAGNOSIS — R310 Gross hematuria: Secondary | ICD-10-CM

## 2021-03-30 DIAGNOSIS — F419 Anxiety disorder, unspecified: Secondary | ICD-10-CM

## 2021-03-30 DIAGNOSIS — F331 Major depressive disorder, recurrent, moderate: Secondary | ICD-10-CM | POA: Diagnosis not present

## 2021-03-30 NOTE — Plan of Care (Signed)
Verbal Consent 
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
## 2021-04-01 ENCOUNTER — Other Ambulatory Visit: Payer: Self-pay

## 2021-04-01 ENCOUNTER — Ambulatory Visit: Payer: Medicare HMO | Admitting: Pulmonary Disease

## 2021-04-01 ENCOUNTER — Encounter: Payer: Self-pay | Admitting: Pulmonary Disease

## 2021-04-01 VITALS — BP 138/80 | HR 77 | Temp 98.4°F | Ht 71.0 in | Wt >= 6400 oz

## 2021-04-01 DIAGNOSIS — G4733 Obstructive sleep apnea (adult) (pediatric): Secondary | ICD-10-CM | POA: Diagnosis not present

## 2021-04-01 DIAGNOSIS — Z9989 Dependence on other enabling machines and devices: Secondary | ICD-10-CM

## 2021-04-01 DIAGNOSIS — I5032 Chronic diastolic (congestive) heart failure: Secondary | ICD-10-CM | POA: Diagnosis not present

## 2021-04-01 NOTE — Progress Notes (Signed)
   Subjective:    Patient ID: Reginald [name], male    DOB: 13-May-1971, 50 y.o.   MRN: [id number]  HPI  50 year old obese man for follow-up of OSA and mild pulmonary hypertension   PMH - severe AS (s/p bovine tissue AVR in 08/2017 with repair of ascending thoracic aortic aneurysm), normal coronary arteries by cardiac catheterization in 2019, paroxysmal atrial fibrillation, CHB (s/p PPM placement in 08/2017, history of bilateral PE on Xarelto, HTN, morbid obesity  Chief Complaint  Patient presents with   Follow-up    Feels cpap is not working well. Wakes up choking from sleep when wearing it.     OSA has been diagnosed 13 years ago He was using a CPAP machine for the last few years that was a loaner from somebody and this was set at 10.5 cm We obtain insurance earlier this year and we set him up with a new CPAP which he obtained around September. This was set on auto settings 5 to 15 cm since then he reports not getting enough air and wakes up choking he feels like the flap is closing in his throat sometimes even when he is awake and this gives him a sensation of dyspnea. He is compliant with Lasix 40 twice daily and takes Zaroxolyn as needed if he feels like fluid is increased Review of his labs from 11/4 shows BUN/creatinine 37/2.2 which is increased Weight has increased by 12 pounds  Significant tests/ events reviewed  NPSG 05/2020 >> AHI 29/hour   PFTs 07/2017 mild restriction, no obstruction, ratio 77, FEV1 75%, FVC 76%, no bronchodilator response, TLC 88%, DLCO 74% but corrects for alveolar volume   Echo 02/2019 normal LVEF, decreased RV function, RVSP 39    Review of Systems neg for any significant sore throat, dysphagia, itching, sneezing, nasal congestion or excess/ purulent secretions, fever, chills, sweats, unintended wt loss, pleuritic or exertional cp, hempoptysis, orthopnea pnd or change in chronic leg swelling. Also denies presyncope, palpitations, heartburn, abdominal pain,  nausea, vomiting, diarrhea or change in bowel or urinary habits, dysuria,hematuria, rash, arthralgias, visual complaints, headache, numbness weakness or ataxia.     Objective:   Physical Exam  Gen. Pleasant, morbidly obese, in no distress ENT - no lesions, no post nasal drip, swollen uvula, class II airway Neck: No JVD, no thyromegaly, no carotid bruits Lungs: no use of accessory muscles, no dullness to percussion, decreased without rales or rhonchi  Cardiovascular: Rhythm regular, heart sounds  normal, no murmurs or gallops, no peripheral edema Musculoskeletal: No deformities, no cyanosis or clubbing , no tremors       Assessment & Plan:

## 2021-04-01 NOTE — Assessment & Plan Note (Signed)
CPAP download was reviewed which shows good control of events on auto settings 5 to 15 cm with average pressure of 10 cm and maximum pressure of 12 cm. In the past his machine was at 10.5 cm and suspect that he needs higher pressure to start weight which is why he is complaining of feeling short of breath on the machine.  We will set his auto settings to start at 10 cm and go as high as 15 cm.  In the past he has not tolerated more than 12 cm in the hospital . If he continues to complain of shortness of breath, then we may have to consider a formal titration study  Weight loss encouraged, compliance with goal of at least 4-6 hrs every night is the expectation. Advised against medications with sedative side effects Cautioned against driving when sleepy - understanding that sleepiness will vary on a day to day basis

## 2021-04-01 NOTE — Patient Instructions (Signed)
  Increase CPAP to 10-15 cm Call me if you still have the feeling of choking or no air  Your creatinine is high Skip pm dose of lasix M/W/F  until 11/18, recheck BMET on 11/29 appt

## 2021-04-01 NOTE — Assessment & Plan Note (Signed)
His last creatinine has increased to 2.2, weight is unchanged He may be a little over diuresed, I have asked him to hold off on p.m. dose of Lasix alternate day for 3-4 doses and then resume usual dosing. He has a follow-up with cardiology in 2 weeks and they can recheck his BMET

## 2021-04-04 ENCOUNTER — Ambulatory Visit: Payer: Medicare HMO | Admitting: *Deleted

## 2021-04-04 DIAGNOSIS — F339 Major depressive disorder, recurrent, unspecified: Secondary | ICD-10-CM

## 2021-04-04 DIAGNOSIS — E1169 Type 2 diabetes mellitus with other specified complication: Secondary | ICD-10-CM

## 2021-04-04 DIAGNOSIS — F419 Anxiety disorder, unspecified: Secondary | ICD-10-CM

## 2021-04-04 DIAGNOSIS — I5032 Chronic diastolic (congestive) heart failure: Secondary | ICD-10-CM

## 2021-04-04 DIAGNOSIS — J449 Chronic obstructive pulmonary disease, unspecified: Secondary | ICD-10-CM

## 2021-04-04 NOTE — Chronic Care Management (AMB) (Signed)
Chronic Care Management    Clinical Social Work Note  04/04/2021 Name: Reginald [name] MRN: [id number] DOB: 1970/10/19  Reginald [name] is a 50 y.o. year old male who is a primary care patient of [name], MD. The CCM team was consulted to assist the patient with chronic disease management and/or care coordination needs related to: [employer]  and [place] [employer].   Engaged with patient by telephone for follow up visit in response to provider referral for social work chronic care management and care coordination services.   Consent to Services:  The patient was given information about Chronic Care Management services, agreed to services, and gave verbal consent prior to initiation of services.  Please see initial visit note for detailed documentation.   Patient agreed to services and consent obtained.   Assessment: Review of patient past medical history, allergies, medications, and health status, including review of relevant consultants reports was performed today as part of a comprehensive evaluation and provision of chronic care management and care coordination services.     SDOH (Social Determinants of Health) assessments and interventions performed:    Advanced Directives Status: Not addressed in this encounter.  CCM Care Plan  No Known Allergies  Outpatient Encounter Medications as of 04/04/2021  Medication Sig Note   acetaminophen (TYLENOL) 325 MG tablet Take 650 mg by mouth every 6 (six) hours as needed for mild pain.    albuterol (PROVENTIL) (2.5 MG/3ML) 0.083% nebulizer solution INHALE 1 VIAL VIA NEBULIZER EVERY 6 HOURS AS NEEDED FOR WHEEZING OR SHORTNESS OF BREATH    albuterol (VENTOLIN HFA) 108 (90 Base) MCG/ACT inhaler INHALE 2 PUFFS INTO THE LUNGS EVERY 6 (SIX) HOURS AS NEEDED FOR WHEEZING OR SHORTNESS OF BREATH.    allopurinol (ZYLOPRIM) 300 MG tablet TAKE 1 TABLET EVERY DAY    ammonium lactate (AMLACTIN) 12 % lotion Apply 1 application  topically as needed for dry skin.    atorvastatin (LIPITOR) 40 MG tablet TAKE 1 TABLET EVERY DAY    Blood Glucose Monitoring Suppl (TRUE METRIX METER) w/Device KIT USE AS DIRECTED    buPROPion (WELLBUTRIN XL) 300 MG 24 hr tablet Take 1 tablet (300 mg total) by mouth daily.    cefdinir (OMNICEF) 300 MG capsule Take 1 capsule (300 mg total) by mouth 2 (two) times daily.    diclofenac Sodium (VOLTAREN) 1 % GEL Apply 2 g topically 4 (four) times daily.    docusate sodium (COLACE) 100 MG capsule Take 2 capsules (200 mg total) by mouth 2 (two) times daily. 02/21/2021: 2 once a day   empagliflozin (JARDIANCE) 10 MG TABS tablet Take 1 tablet (10 mg total) by mouth daily before breakfast. 03/03/2021: Gets from patient assistance ([employer])   FEROSUL 325 (65 Fe) MG tablet TAKE 1 TABLET EVERY DAY    fexofenadine (ALLEGRA) 180 MG tablet Take 180 mg by mouth daily.    gabapentin (NEURONTIN) 100 MG capsule TAKE 1 CAPSULE IN THE MORNING AND TAKE 2 CAPSULES IN THE EVENING    glipiZIDE (GLUCOTROL XL) 5 MG 24 hr tablet Take 1 tablet (5 mg total) by mouth daily with breakfast.    hydrOXYzine (ATARAX/VISTARIL) 25 MG tablet Take 1 tablet (25 mg total) by mouth every 8 (eight) hours as needed.    Krill Oil 350 MG CAPS Take 1 capsule by mouth in the morning and at bedtime.     lidocaine-prilocaine (EMLA) cream Apply 1 application topically as needed.    metFORMIN (GLUCOPHAGE) 850 MG  tablet TAKE 1 TABLET TWICE DAILY WITH MEALS    metolazone (ZAROXOLYN) 5 MG tablet TAKE 1 TABLET (5 MG TOTAL) BY MOUTH THREE TIMES A WEEK. 03/03/2021: Has only been using ~1 time per week recently to maintain fluid   metoprolol tartrate (LOPRESSOR) 100 MG tablet TAKE 1 TABLET TWICE DAILY    pantoprazole (PROTONIX) 40 MG tablet Take 1 tablet (40 mg total) by mouth daily.    polyethylene glycol (MIRALAX) 17 g packet Take 17 g by mouth daily as needed.    potassium chloride SA (KLOR-CON) 20 MEQ tablet TAKE 3 TABS DAILY AND TAKE AN ADDITIONAL 1  TAB ON THE DAY YOU TAKE YOUR WEEKLY METOLAZONE DOSE    rivaroxaban (XARELTO) 20 MG TABS tablet TAKE 1 Tablet BY MOUTH ONCE EVERY DAY WITH SUPPER    Semaglutide (RYBELSUS) 14 MG TABS Take 14 mg by mouth in the morning. 03/03/2021: Gets from patient assistance ([employer])   terbinafine (LAMISIL) 250 MG tablet Take 1 tablet by mouth once daily    Tiotropium Bromide Monohydrate (SPIRIVA RESPIMAT) 2.5 MCG/ACT AERS Inhale 2 puffs into the lungs daily. 03/03/2021: Gets from patient assistance ([employer])   torsemide (DEMADEX) 20 MG tablet Take 2 tablets (40 mg total) by mouth 2 (two) times daily.    TRUE METRIX BLOOD GLUCOSE TEST test strip TEST ONE TIME DAILY FOR DIABETES    TRUEplus Lancets 33G MISC USE TO TEST ONE TIME DAILY FOR DIABETES    No facility-administered encounter medications on file as of 04/04/2021.    Patient Active Problem List   Diagnosis Date Noted   Acute on chronic diastolic CHF (congestive heart failure) ([place]) 03/25/2021   Morbid obesity with BMI of 60.0-69.9, adult ([place]) 03/25/2021   UTI (urinary tract infection) 03/24/2021   Lactic acidosis 03/24/2021   Leukocytosis 03/24/2021   Hyperglycemia due to diabetes mellitus ([place]) 03/24/2021   Mixed hyperlipidemia 03/24/2021   Hematuria 03/24/2021   Atrial fibrillation, chronic (HCC) 03/24/2021   Prolonged QT interval 03/24/2021   Iron deficiency anemia 02/21/2021   Constipation 02/21/2021   Rectal bleeding    Chronic blood loss anemia 11/12/2020   Gastrointestinal hemorrhage 10/08/2020   S/P placement of cardiac pacemaker 02/10/2020   Encounter for examination following treatment at hospital 02/10/2020   Anxiety 02/10/2020   DM2 (diabetes mellitus, type 2) ([place]) 02/10/2020   OSA on CPAP    Abnormal liver function 03/16/2019   History of pulmonary embolism 03/16/2019   Paroxysmal atrial fibrillation ([place]) 09/05/2018   COPD (chronic obstructive pulmonary disease) ([place]) 05/07/2018   Gastroesophageal reflux disease     Pulmonary embolism ([place]) 09/18/2017   Encounter for therapeutic drug monitoring 09/04/2017   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 08/23/2017   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   Pulmonary hypertension ([place])    Chronic periodontitis 07/18/2017   Morbid obesity ([place])    Essential hypertension    Tobacco abuse    Chronic heart failure with preserved ejection fraction (HFpEF) ([place])    Chronic venous insufficiency     Conditions to be addressed/monitored: Depression; Mental Health Concerns   Care Plan : LCSW PLAN OF CARE  Updates made by Deirdre Peer, LCSW since 04/04/2021 12:00 AM     Problem: Unmanaged depression   Priority: High     Long-Range Goal: Reduce symptoms/feelings related to depression   Start Date: 12/21/2020  Expected End Date: 05/20/2021  This Visit's Progress: On track  Recent Progress: On track  Priority: High  Note:   Current Barriers:  Chronic Mental Health needs related to depression Mental Health Concerns  Suicidal Ideation/Homicidal Ideation: No  Clinical Social Work Goal(s):  patient will work with SW  PRN  by telephone or in person to reduce or manage symptoms related to depression  patient will work with SW to address concerns related to depression and treatment options patient will work with PCP and Pharmacist to address needs related to medication management/adjustment of current RX depression regimen to better treat symptoms patient will demonstrate improved health management independence as evidenced by reduction  of PHQ9 score  Interventions: 11/14- Pt met with Maye Hides, LCSW, at [facility] for his initial visit- he shared with me he was billed over $150 for his portion and he cannot afford this- CSW assisted pt with calling [employer] who indicated his copay for outpatient mental health counseling is $92- CSW then assisted pt with calling Billing Dept and alerted  them to this- per [place] with [employer], his insurance has not been filed yet. Pt was advised not to pay the amount billed and to wait for filing and expect a $20 copay.  Pt is agreeable to seeing the counselor again- CSW will alert Maye Hides, LCSW, to this for follow up scheduling.   10/25- still seeking options for pt that are in-network with his insurance  03/07/21- Pt reports [facility] was only able to link him virtual counseling and he wants in personl. CSW validated the in-person preference and will seek options for him.    02/04/21: Pt reports feeling better today. He is tired and feels his CPAP may be causing him to not sleep well. He plans to discuss further with provider.  CSW discussed his mental health with him- he is now willing to consider counseling and agrees to referral being made to [place]. CSW advised pt to expect a call from [place] to schedule him.   02/01/21: CSW received call from pt today who reports having a lot of side effects from medications (recently changed/adjusted). Per pt, "I am crying more and over nothing". He also shared that he called the 65 hotline for mental health crisis and was told the line was for people suicidal. Pt states, "I'm not suicidal and would not do that because I love myself too much... and my pets...." he also states that his "faith prevents him from doing that".  Pt has a lot of anxiety today around his medications and reports being told to do different things by the different providers. CSW advised pt he would need to get guidance from clinical staff (PCP, [place], RN) and CSW will alert the team of above.  CSW did remind pt to call CSW anytime and to alwaysnot hesitate to call 911 if emergency arises.    01/14/21: CSW spoke with pt who admits to history of depression. Pt denies any current or past SI/HI, hospitalizations. Pt is currently on Buspar and Celexa for his depression; his PHQ9 score today was 15- CSW discussed with pt the  significance/severity of his score and discussed options. Pt declines counseling support. He also declines referral to Psychiatry indicating he use to see "Dr Truman Hayward" who prescribed his meds but now his PCP does. Pt does not wish to be referred back to "Dr Truman Hayward" nor any other Psychiatrist. Pt also shares that the Buspar is 3 x daily- would like to consider other options that may be better coverage of his symptoms and needs; and less pills- "have to  remember to take it".  CSW discussed HCPOA with pt; he does not have anyone appointed and states he does not have a relationship with his family. Encouraged pt to consider completion of Advance Directives- will mail pt packet to review.  Patient interviewed and appropriate assessments performed: PHQ 9 1:1 collaboration with [name], MD regarding development and update of comprehensive plan of care as evidenced by provider attestation and co-signature Patient interviewed and appropriate assessments performed Provided mental health counseling with regard to depression; medications, counseling options  Discussed plans with patient for ongoing care management follow up and provided patient with direct contact information for care management team Provided education and assistance to client regarding Advanced Directives. Depression screen reviewed , PHQ2/ PHQ9 completed, Solution-Focused Strategies, Active listening / Reflection utilized , Psychoeducation for mental health needs , Reviewed mental health medications with patient and discussed compliance: discussed options for counseling and/or RX adjustments being made, Participation in counseling encouraged , Verbalization of feelings encouraged , Suicidal Ideation/Homicidal Ideation assessed:, Discussed [place] , Discussed referral to [place] to assist with connecting to mental health provider, and pt declines counseling at this time  Patient Self Care Activities:  Self administers  medications as prescribed Attends all scheduled provider appointments Performs ADL's independently Performs IADL's independently Independent living  Patient Coping Strengths:  Hopefulness Self Advocate Able to Communicate Effectively  Patient Self Care Deficits:  Lacks social connections Family not involved/connected  Patient Goals:  -attend counseling appointments with Maye Hides  ([address])  -call 911 for emergency or [phone] for mental health support/crisis  -discuss medication adjustment/options with PCP and Pharmacist at PCP office - avoid negative self-talk - develop a personal safety plan - develop a plan to deal with triggers like holidays, anniversaries - have a plan for how to handle bad days - spend time or talk with others at least 2 to 3 times per week - spend time or talk with others every day - watch for early signs of feeling worse   Follow Up Plan: Appointment scheduled for SW follow up with client by phone on: 05/04/21      Follow Up Plan: Appointment scheduled for SW follow up with client by phone on: 05/04/21      Eduard Clos MSW, LCSW Licensed Clinical Social Education officer, environmental Primary Care [phone]

## 2021-04-04 NOTE — Patient Instructions (Signed)
 (  Visit Information  (Copy and paste patient goals from clinical care plan here)  Patient verbalizes understanding of instructions provided today and agrees to view in [place].   Telephone follow up appointment with care management team member scheduled for:05/04/21  Eduard Clos MSW, LCSW Licensed Clinical Social Education officer, environmental Primary Care [phone]

## 2021-04-05 ENCOUNTER — Ambulatory Visit: Payer: Medicare HMO | Admitting: Urology

## 2021-04-05 ENCOUNTER — Other Ambulatory Visit: Payer: Self-pay

## 2021-04-05 ENCOUNTER — Encounter: Payer: Self-pay | Admitting: Urology

## 2021-04-05 ENCOUNTER — Ambulatory Visit: Payer: Medicare HMO | Admitting: *Deleted

## 2021-04-05 VITALS — BP 129/80 | HR 72 | Temp 99.3°F

## 2021-04-05 DIAGNOSIS — R310 Gross hematuria: Secondary | ICD-10-CM | POA: Diagnosis not present

## 2021-04-05 DIAGNOSIS — I5032 Chronic diastolic (congestive) heart failure: Secondary | ICD-10-CM

## 2021-04-05 DIAGNOSIS — N138 Other obstructive and reflux uropathy: Secondary | ICD-10-CM | POA: Insufficient documentation

## 2021-04-05 DIAGNOSIS — N401 Enlarged prostate with lower urinary tract symptoms: Secondary | ICD-10-CM | POA: Diagnosis not present

## 2021-04-05 DIAGNOSIS — J449 Chronic obstructive pulmonary disease, unspecified: Secondary | ICD-10-CM

## 2021-04-05 MED ORDER — TAMSULOSIN HCL 0.4 MG PO CAPS: 0.4000 mg | ORAL_CAPSULE | Freq: Every day | ORAL | 11 refills | Status: DC

## 2021-04-05 NOTE — Progress Notes (Signed)
Urological Symptom Review  Patient is experiencing the following symptoms: Frequent urination Hard to postpone urination Burning/pain with urination Get up at night to urinate Leakage of urine Trouble starting stream Have to strain to urinate Blood in urine   Review of Systems  Gastrointestinal (upper)  : Nausea Indigestion/heartburn  Gastrointestinal (lower) : Diarrhea Constipation  Constitutional : Fatigue  Skin: Negative for skin symptoms  Eyes: Blurred vision  Ear/Nose/Throat : Sinus problems  Hematologic/Lymphatic: Easy bruising  Cardiovascular : Leg swelling Chest pain  Respiratory : Shortness of breath  Endocrine: Excessive thirst  Musculoskeletal: Back pain Joint pain  Neurological: Negative for neurological symptoms  Psychologic: Depression Anxiety

## 2021-04-05 NOTE — Progress Notes (Signed)
Assessment: 1. Gross hematuria   2. BPH with obstruction/lower urinary tract symptoms     Plan: I reviewed the patient's records from his recent hospitalization including physician notes, laboratory results, and imaging studies.  I personally reviewed the CT scan from 03/24/2021 with results as noted below. Today I had a discussion with the patient regarding the findings of gross hematuria including the implications and differential diagnoses associated with it.  I also discussed recommendations for further evaluation including the rationale for upper tract imaging and cystoscopy.  I discussed the nature of these procedures including potential risk and complications.  The patient expressed an understanding of these issues. PSA today Trial of tamsulosin 0.4 mg daily.  Rx sent. Return to office in 3-4 weeks for cystoscopy  Chief Complaint:  Chief Complaint  Patient presents with   Hematuria    History of Present Illness:  Reginald [name] is a 50 y.o. year old male who is seen in consultation from [name], MD for evaluation of gross hematuria and LUTS.  He reports a recent history of gross hematuria, dysuria, and urinary hesitancy.  His symptoms began in early November 2022.  He presented to the emergency room on 03/24/2021 and was found to be hypotensive.  Urinalysis showed >50 RBCs.  Urine culture showed no growth.  CT abdomen and pelvis without contrast from 03/24/2021 showed no renal abnormalities, no stones or hydronephrosis, normal-appearing bladder.  He was treated with antibiotics for possible UTI.  Since discharge, the gross hematuria has resolved.  He continues to report some slight dysuria and hesitancy.  He does have urinary frequency and nocturia baseline.  No prior history of stones or UTIs. AUA score = 18 today.  CT abdomen pelvis with contrast from 11/12/2020 showed no renal mass or obstruction.   Past Medical History:  Past Medical History:  Diagnosis Date   Anxiety     Aortic stenosis    Arthritis    Asthma    Back pain    Cellulitis of skin with lymphangitis    CHF (congestive heart failure) (HCC)    Chronic diastolic congestive heart failure (HCC)    Chronic venous insufficiency    Constipation    COPD (chronic obstructive pulmonary disease) (HCC)    Depression    Diabetes ([place])    Dyspnea    Essential hypertension    Gout    Heart murmur    Hypertension    Morbid obesity ([place])    Obesity    Pneumonia    walking pneumonia   Prostatitis    Pulmonary embolism ([place])    Pulmonary hypertension (HCC)    Pyelonephritis    S/P aortic valve replacement with bioprosthetic valve 08/23/2017   25 mm Edwards Inspiris Resilia stented bovine pericardial tissue valve   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   24 mm Hemashield supracoronary straight graft    Sleep apnea    cpap    Thoracic ascending aortic aneurysm    Thoracic ascending aortic aneurysm    Tobacco abuse     Past Surgical History:  Past Surgical History:  Procedure Laterality Date   AORTIC VALVE REPLACEMENT N/A 08/23/2017   Procedure: AORTIC VALVE REPLACEMENT (AVR) USING INSPIRIS RESILIA AORTIC VALVE SIZE 25 MM;  Surgeon: Rexene Alberts, MD;  Location: [facility];  Service: Open Heart Surgery;  Laterality: N/A;   CARDIAC VALVE REPLACEMENT N/A    [employer] 02/07/2020   COLONOSCOPY WITH PROPOFOL N/A 11/14/2020   Procedure: COLONOSCOPY WITH PROPOFOL;  Surgeon: Gatha Mayer, MD;  [facility] END[facility]COPY;  Service: Endoscopy;  Laterality: N/A;   ESOPHAGOGASTRODUODENOSCOPY (EGD) WITH PROPOFOL N/A 11/14/2020   Procedure: ESOPHAGOGASTRODUODENOSCOPY (EGD) WITH PROPOFOL;  Surgeon: Gatha Mayer, MD;  Location: [place];  Service: Endoscopy;  Laterality: N/A;   MULTIPLE EXTRACTIONS WITH ALVEOLOPLASTY N/A 07/23/2017   Procedure: Extraction of tooth #10 with alveoloplasty and gross debridement of remaining teeth;  Surgeon: Lenn Cal, DDS;  Location: [facility];  Service: Oral  Surgery;  Laterality: N/A;   PACEMAKER IMPLANT N/A 08/28/2017    [employer] Jude [employer] Assurity MRI conditional  dual-chamber pacemaker for symptomatic complete heart blockby Dr Rayann Heman   TEE WITHOUT CARDIOVERSION N/A 07/16/2017   Procedure: TRANSESOPHAGEAL ECHOCARDIOGRAM (TEE);  Surgeon: Lelon Perla, MD;  Location[facility] ENDOSCOPY;  Service: Cardiovascular;  Laterality: N/A;   TEE WITHOUT CARDIOVERSION N/A 08/23/2017   Procedure: TRANSESOPHAGEAL ECHOCARDIOGRAM (TEE);  Surgeon: Rexene Alberts, MD;  Location: [facility];  Service: Open Heart Surgery;  Laterality: N/A;   THORACIC AORTIC ANEURYSM REPAIR N/A 08/23/2017   Procedure: THORACIC ASCENDING ANEURYSM REPAIR (AAA) USING HEMASHIELD GOLD KNITTED MICROVEL DOUBLE VELOUR VASCULAR GRAFT D: 24 MM  L: 30 CM;  Surgeon: Rexene Alberts, MD;  Location: [facility];  Service: Open Heart Surgery;  Laterality: N/A;    Allergies:  No Known Allergies  Family History:  Family History  Problem Relation Age of Onset   Hypertension Mother    Alzheimer's disease Mother    Heart attack Mother    Heart attack Brother    Diabetes Brother     Social History:  Social History   Tobacco Use   Smoking status: Former    Packs/day: 2.00    Years: 16.00    Pack years: 32.00    Types: Cigarettes    Quit date: 08/23/2017    Years since quitting: 3.6   Smokeless tobacco: Never  Vaping Use   Vaping Use: Never used  Substance Use Topics   Alcohol use: No    Comment: have had alcohol in the past, not heavy   Drug use: No    Review of symptoms:  Constitutional:  Negative for unexplained weight loss, night sweats, fever, chills ENT:  Negative for nose bleeds, sinus pain, painful swallowing CV:  Negative for chest pain, shortness of breath, exercise intolerance, palpitations, loss of consciousness Resp:  Negative for cough, wheezing, shortness of breath GI:  Negative for nausea, vomiting, diarrhea, bloody stools GU:  Positives noted in HPI; otherwise negative for  urinary incontinence Neuro:  Negative for seizures, poor balance, limb weakness, slurred speech Psych:  Negative for lack of energy, depression, anxiety Endocrine:  Negative for polydipsia, polyuria, symptoms of hypoglycemia (dizziness, hunger, sweating) Hematologic:  Negative for anemia, purpura, petechia, prolonged or excessive bleeding, use of anticoagulants  Allergic:  Negative for difficulty breathing or choking as a result of exposure to anything; no shellfish allergy; no allergic response (rash/itch) to materials, foods  Physical exam: BP 129/80   Pulse 72   Temp 99.3 F (37.4 C)  GENERAL APPEARANCE:  Well appearing, well developed, well nourished, NAD HEENT: Atraumatic, Normocephalic, oropharynx clear. NECK: Supple without lymphadenopathy or thyromegaly. LUNGS: Clear to auscultation bilaterally. HEART: Regular Rate and Rhythm without murmurs, gallops, or rubs. ABDOMEN: Soft, non-tender, No Masses. EXTREMITIES: Moves all extremities well.  Without clubbing, cyanosis, or edema. NEUROLOGIC:  Alert and oriented x 3, normal gait, CN II-XII grossly intact.  MENTAL STATUS:  Appropriate. BACK:  Non-tender to palpation.  No  CVAT SKIN:  Warm, dry and intact.   GU: Penis:  uncircumcised Meatus: Normal Scrotum: normal, no masses Testis: normal without masses bilateral Epididymis: normal Prostate: difficult to feel base, NT, no nodules Rectum: Normal tone,  no masses or tenderness   Results: U/A dipstick negative

## 2021-04-05 NOTE — Patient Instructions (Signed)
Visit Information  Heart Failure- - Important- call cardiologist office if I gain more than 3 pounds in one day or 5 pounds in one week- follow heart failure action plan, know how you are feeling each day with emphasis on yellow zone - elevate legs when you can - continue to weigh daily and write weight in diary- weigh same time each day on flat, hard surface - follow low salt diet- read labels, avoid salty snacks and gatorade - watch for swelling in feet, ankles and legs every day - take all medications as prescribed - keep in touch with your doctor about weight gain, swelling COPD- - Continue to follow COPD rescue plan, be mindful of how you feel each day with emphasis on yellow zone - continue follow rescue plan if symptoms flare-up, call doctor early on if not feeling well - keep follow-up appointments - see cardiologist 11/29 primary care doctor 12/23 - continue to use CPAP as prescribed- call DME company if having any problems with machine - take all medications and use inhalers as prescribed, talk to pharmacist assigned to your case if any questions or concerns - please continue to work with Education officer, museum for management of depression/ anxiety, continue to see counselor through [employer] - practice energy conservation, alternate activity with rest   The patient verbalized understanding of instructions, educational materials, and care plan provided today and declined offer to receive copy of patient instructions, educational materials, and care plan.   Telephone follow up appointment with care management team member scheduled for:   05/10/2021  Jacqlyn Larsen [facility], BSN RN Case Manager [facility] [phone]

## 2021-04-05 NOTE — Chronic Care Management (AMB) (Signed)
Chronic Care Management   CCM RN Visit Note  04/05/2021 Name: Reginald [name] MRN: [id number] DOB: 1970/10/11  Subjective: Reginald [name] is a 49 y.o. year old male who is a primary care patient of [name], MD. The care management team was consulted for assistance with disease management and care coordination needs.    Engaged with patient by telephone for follow up visit in response to provider referral for case management and/or care coordination services.   Consent to Services:  The patient was given information about Chronic Care Management services, agreed to services, and gave verbal consent prior to initiation of services.  Please see initial visit note for detailed documentation.   Patient agreed to services and verbal consent obtained.   Assessment: Review of patient past medical history, allergies, medications, health status, including review of consultants reports, laboratory and other test data, was performed as part of comprehensive evaluation and provision of chronic care management services.   SDOH (Social Determinants of Health) assessments and interventions performed:    CCM Care Plan  No Known Allergies  Outpatient Encounter Medications as of 04/05/2021  Medication Sig Note   acetaminophen (TYLENOL) 325 MG tablet Take 650 mg by mouth every 6 (six) hours as needed for mild pain.    albuterol (PROVENTIL) (2.5 MG/3ML) 0.083% nebulizer solution INHALE 1 VIAL VIA NEBULIZER EVERY 6 HOURS AS NEEDED FOR WHEEZING OR SHORTNESS OF BREATH    albuterol (VENTOLIN HFA) 108 (90 Base) MCG/ACT inhaler INHALE 2 PUFFS INTO THE LUNGS EVERY 6 (SIX) HOURS AS NEEDED FOR WHEEZING OR SHORTNESS OF BREATH.    allopurinol (ZYLOPRIM) 300 MG tablet TAKE 1 TABLET EVERY DAY    ammonium lactate (AMLACTIN) 12 % lotion Apply 1 application topically as needed for dry skin.    atorvastatin (LIPITOR) 40 MG tablet TAKE 1 TABLET EVERY DAY    Blood Glucose Monitoring Suppl (TRUE METRIX METER) w/Device  KIT USE AS DIRECTED    buPROPion (WELLBUTRIN XL) 300 MG 24 hr tablet Take 1 tablet (300 mg total) by mouth daily.    cefdinir (OMNICEF) 300 MG capsule Take 1 capsule (300 mg total) by mouth 2 (two) times daily.    diclofenac Sodium (VOLTAREN) 1 % GEL Apply 2 g topically 4 (four) times daily.    docusate sodium (COLACE) 100 MG capsule Take 2 capsules (200 mg total) by mouth 2 (two) times daily. 02/21/2021: 2 once a day   empagliflozin (JARDIANCE) 10 MG TABS tablet Take 1 tablet (10 mg total) by mouth daily before breakfast. 03/03/2021: Gets from patient assistance ([employer])   FEROSUL 325 (65 Fe) MG tablet TAKE 1 TABLET EVERY DAY    fexofenadine (ALLEGRA) 180 MG tablet Take 180 mg by mouth daily.    gabapentin (NEURONTIN) 100 MG capsule TAKE 1 CAPSULE IN THE MORNING AND TAKE 2 CAPSULES IN THE EVENING    glipiZIDE (GLUCOTROL XL) 5 MG 24 hr tablet Take 1 tablet (5 mg total) by mouth daily with breakfast.    hydrOXYzine (ATARAX/VISTARIL) 25 MG tablet Take 1 tablet (25 mg total) by mouth every 8 (eight) hours as needed.    Krill Oil 350 MG CAPS Take 1 capsule by mouth in the morning and at bedtime.     lidocaine-prilocaine (EMLA) cream Apply 1 application topically as needed.    metFORMIN (GLUCOPHAGE) 850 MG tablet TAKE 1 TABLET TWICE DAILY WITH MEALS    metolazone (ZAROXOLYN) 5 MG tablet TAKE 1 TABLET (5 MG TOTAL) BY MOUTH THREE TIMES A  WEEK. 03/03/2021: Has only been using ~1 time per week recently to maintain fluid   metoprolol tartrate (LOPRESSOR) 100 MG tablet TAKE 1 TABLET TWICE DAILY    pantoprazole (PROTONIX) 40 MG tablet Take 1 tablet (40 mg total) by mouth daily.    polyethylene glycol (MIRALAX) 17 g packet Take 17 g by mouth daily as needed.    potassium chloride SA (KLOR-CON) 20 MEQ tablet TAKE 3 TABS DAILY AND TAKE AN ADDITIONAL 1 TAB ON THE DAY YOU TAKE YOUR WEEKLY METOLAZONE DOSE    rivaroxaban (XARELTO) 20 MG TABS tablet TAKE 1 Tablet BY MOUTH ONCE EVERY DAY WITH SUPPER    Semaglutide  (RYBELSUS) 14 MG TABS Take 14 mg by mouth in the morning. 03/03/2021: Gets from patient assistance ([employer])   terbinafine (LAMISIL) 250 MG tablet Take 1 tablet by mouth once daily    Tiotropium Bromide Monohydrate (SPIRIVA RESPIMAT) 2.5 MCG/ACT AERS Inhale 2 puffs into the lungs daily. 03/03/2021: Gets from patient assistance ([employer])   torsemide (DEMADEX) 20 MG tablet Take 2 tablets (40 mg total) by mouth 2 (two) times daily.    TRUE METRIX BLOOD GLUCOSE TEST test strip TEST ONE TIME DAILY FOR DIABETES    TRUEplus Lancets 33G MISC USE TO TEST ONE TIME DAILY FOR DIABETES    No facility-administered encounter medications on file as of 04/05/2021.    Patient Active Problem List   Diagnosis Date Noted   Acute on chronic diastolic CHF (congestive heart failure) ([place]) 03/25/2021   Morbid obesity with BMI of 60.0-69.9, adult ([place]) 03/25/2021   UTI (urinary tract infection) 03/24/2021   Lactic acidosis 03/24/2021   Leukocytosis 03/24/2021   Hyperglycemia due to diabetes mellitus ([place]) 03/24/2021   Mixed hyperlipidemia 03/24/2021   Hematuria 03/24/2021   Atrial fibrillation, chronic ([place]) 03/24/2021   Prolonged QT interval 03/24/2021   Iron deficiency anemia 02/21/2021   Constipation 02/21/2021   Rectal bleeding    Chronic blood loss anemia 11/12/2020   Gastrointestinal hemorrhage 10/08/2020   S/P placement of cardiac pacemaker 02/10/2020   Encounter for examination following treatment at hospital 02/10/2020   Anxiety 02/10/2020   DM2 (diabetes mellitus, type 2) ([place]) 02/10/2020   OSA on CPAP    Abnormal liver function 03/16/2019   History of pulmonary embolism 03/16/2019   Paroxysmal atrial fibrillation ([place]) 09/05/2018   COPD (chronic obstructive pulmonary disease) ([place]) 05/07/2018   Gastroesophageal reflux disease    Pulmonary embolism ([place]) 09/18/2017   Encounter for therapeutic drug monitoring 09/04/2017   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  ascending thoracic aortic aneurysm 08/23/2017   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   Pulmonary hypertension ([place])    Chronic periodontitis 07/18/2017   Morbid obesity ([place])    Essential hypertension    Tobacco abuse    Chronic heart failure with preserved ejection fraction (HFpEF) ([place])    Chronic venous insufficiency     Conditions to be addressed/monitored:CHF and COPD  Care Plan : RN Care Manager plan of care  Updates made by Kassie Mends, RN since 04/05/2021 12:00 AM     Problem: No plan of care established for management of chronic disease states (CHF, COPD)   Priority: High     Long-Range Goal: Development of plan of care for chronic disease management (CHF, COPD)   Priority: High  Note:   Current Barriers:  Knowledge Deficits related to plan of care for management of CHF and COPD  Knowledge deficit related to basic heart failure pathophysiology  and self care management- needs reinforcement for HF action plan, symptom management Does not adhere to provider recommendations re: low sodium diet, sometimes eats fast food, drinks gatorade although trying to cut down, does not exercise, feels he is not able, stays tired, pt reports he checks CBG once daily with most recent AIC 5.8, CBG ranges 120-130 per pt. Checks blood pressure on occasion, has had no further GI bleeding (pt states it was due to hemorrhoids) Patient does continue to weigh daily with weight today 479 pounds.  Patient reports he still has anxiety and is working with [employer] and Social worker at [employer] , continues working with pharmacist for any medication related needs.  Pt reports he has all medications and taking as prescribed, reports had hematuria which has now resolved and is following up with urologist Dr. Felipa Eth today. Lacks social connections- pt lives alone, has a friend he can call if needed, does not communicate much with his family, gets out and drives to the grocery store and flea  market to talk with friends Knowledge deficits related to basic understanding of COPD disease process- needs reinforcement of COPD action plan, symptom management, pt uses CPAP, checks 02 sat as needed and is taking CPAP machine today to [facility] in [place] have serviced/ recalibrated. 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   Interventions: 1:1 collaboration with primary care provider regarding development and update of comprehensive plan of care as evidenced by provider attestation and co-signature Inter-disciplinary care team collaboration (see longitudinal plan of care) Evaluation of current treatment plan related to  self management and patient's adherence to plan as established by provider  COPD Interventions:   Advised patient to track and manage COPD triggers;  Advised patient to self assesses COPD action plan zone and make appointment with provider if in the yellow  zone for 48 hours without improvement; Advised patient to engage in light exercise as tolerated 3-5 days a week to aid in the the management of COPD; Provided education about and advised patient to utilize infection prevention strategies to reduce risk of respiratory infection; Discussed the importance of adequate rest and management of fatigue with COPD; Reviewed importance of good handwashing and wearing a mask as needed Reviewed importance of wearing CPAP as prescribed and calling DME company for any issues  Heart Failure Interventions: Discussed importance of daily weight and advised patient to weigh and record daily; Reviewed role of diuretics in prevention of fluid overload and management of heart failure;  Reinforced low sodium diet Ask patient not  to drink gatorade due to sodium content Reinforced HF action plan and importance of calling doctor early on for change in health status, symptoms Encouraged patient to continue getting outdoors daily and trying to stay active as possible Reviewed all upcoming scheduled appointments Pain assessment completed Reviewed CBG readings  Patient Goals/Self-Care Activities: Patient will self administer medications as prescribed as evidenced by self report/primary caregiver report  Patient will attend all scheduled provider appointments as evidenced by clinician review of documented attendance to scheduled appointments and patient/caregiver report Patient will attend church or other social activities as evidenced by patient report Patient will continue to perform ADL's independently as evidenced by patient/caregiver report Patient will continue to perform IADL's independently as evidenced by patient/caregiver report Patient will call provider office for new concerns or questions as evidenced by review of documented incoming telephone call notes and patient report Heart Failure- - Important- call cardiologist office if I gain more than 3 pounds in  one day or 5 pounds in one week- follow heart failure action plan, know how you are feeling each day with emphasis on yellow zone - elevate legs when you can - continue to weigh daily and write weight in diary- weigh same time each day on flat, hard surface - follow low salt diet- read labels, avoid salty snacks and gatorade - watch for swelling in feet, ankles and legs every day - take all medications as prescribed - keep in touch with your doctor about weight gain, swelling COPD- - Continue to follow COPD rescue plan, be mindful of how you feel each day with emphasis on yellow zone - continue follow rescue plan if symptoms flare-up, call doctor early on if not feeling well - keep follow-up appointments - see cardiologist 11/29 primary care doctor 12/23 - continue to use CPAP as prescribed- call DME company if having any problems with machine - take all medications and use inhalers as prescribed, talk to pharmacist assigned to your case if any questions or concerns - please continue to work with Education officer, museum for management of depression/ anxiety, continue to see counselor through [facility] - practice energy conservation, alternate activity with rest   Follow Up Plan:  Telephone follow up appointment with care management team member scheduled for:  05/10/2021       Plan:Telephone follow up appointment with care management team member scheduled for:  05/10/2021  Jacqlyn Larsen [facility], BSN RN Case Manager [facility] [phone]

## 2021-04-06 ENCOUNTER — Other Ambulatory Visit: Payer: Self-pay | Admitting: Internal Medicine

## 2021-04-06 DIAGNOSIS — M1A9XX0 Chronic gout, unspecified, without tophus (tophi): Secondary | ICD-10-CM

## 2021-04-06 LAB — PSA: Prostate Specific Ag, Serum: 1.9 ng/mL (ref 0.0–4.0)

## 2021-04-12 DIAGNOSIS — J343 Hypertrophy of nasal turbinates: Secondary | ICD-10-CM | POA: Diagnosis not present

## 2021-04-12 DIAGNOSIS — J342 Deviated nasal septum: Secondary | ICD-10-CM | POA: Diagnosis not present

## 2021-04-12 DIAGNOSIS — J310 Chronic rhinitis: Secondary | ICD-10-CM | POA: Diagnosis not present

## 2021-04-13 ENCOUNTER — Encounter (INDEPENDENT_AMBULATORY_CARE_PROVIDER_SITE_OTHER): Payer: Self-pay

## 2021-04-13 ENCOUNTER — Other Ambulatory Visit (INDEPENDENT_AMBULATORY_CARE_PROVIDER_SITE_OTHER): Payer: Self-pay

## 2021-04-13 DIAGNOSIS — D509 Iron deficiency anemia, unspecified: Secondary | ICD-10-CM

## 2021-04-19 ENCOUNTER — Other Ambulatory Visit (HOSPITAL_COMMUNITY)
Admission: RE | Admit: 2021-04-19 | Discharge: 2021-04-19 | Disposition: A | Discharge status: Home / Self Care | Payer: Medicare HMO | Source: Ambulatory Visit | Attending: Student | Admitting: Student

## 2021-04-19 ENCOUNTER — Ambulatory Visit: Payer: Medicare HMO | Admitting: Pharmacist

## 2021-04-19 ENCOUNTER — Other Ambulatory Visit: Payer: Self-pay

## 2021-04-19 ENCOUNTER — Ambulatory Visit: Payer: Medicare HMO | Admitting: Student

## 2021-04-19 ENCOUNTER — Encounter: Payer: Self-pay | Admitting: Student

## 2021-04-19 VITALS — BP 120/84 | HR 70 | Ht 71.0 in | Wt >= 6400 oz

## 2021-04-19 DIAGNOSIS — E1169 Type 2 diabetes mellitus with other specified complication: Secondary | ICD-10-CM

## 2021-04-19 DIAGNOSIS — I5032 Chronic diastolic (congestive) heart failure: Secondary | ICD-10-CM

## 2021-04-19 DIAGNOSIS — Z79899 Other long term (current) drug therapy: Secondary | ICD-10-CM

## 2021-04-19 DIAGNOSIS — I480 Paroxysmal atrial fibrillation: Secondary | ICD-10-CM | POA: Diagnosis not present

## 2021-04-19 DIAGNOSIS — I442 Atrioventricular block, complete: Secondary | ICD-10-CM

## 2021-04-19 DIAGNOSIS — F339 Major depressive disorder, recurrent, unspecified: Secondary | ICD-10-CM

## 2021-04-19 DIAGNOSIS — I10 Essential (primary) hypertension: Secondary | ICD-10-CM

## 2021-04-19 DIAGNOSIS — R319 Hematuria, unspecified: Secondary | ICD-10-CM | POA: Diagnosis not present

## 2021-04-19 DIAGNOSIS — J449 Chronic obstructive pulmonary disease, unspecified: Secondary | ICD-10-CM

## 2021-04-19 DIAGNOSIS — F419 Anxiety disorder, unspecified: Secondary | ICD-10-CM

## 2021-04-19 DIAGNOSIS — Z953 Presence of xenogenic heart valve: Secondary | ICD-10-CM | POA: Diagnosis not present

## 2021-04-19 LAB — BASIC METABOLIC PANEL
Anion gap: 9 (ref 5–15)
BUN: 28 mg/dL — ABNORMAL HIGH (ref 6–20)
CO2: 31 mmol/L (ref 22–32)
Calcium: 9.1 mg/dL (ref 8.9–10.3)
Chloride: 101 mmol/L (ref 98–111)
Creatinine, Ser: 1.58 mg/dL — ABNORMAL HIGH (ref 0.61–1.24)
GFR, Estimated: 53 mL/min — ABNORMAL LOW (ref >60)
Glucose, Bld: 101 mg/dL — ABNORMAL HIGH (ref 70–99)
Potassium: 4.2 mmol/L (ref 3.5–5.1)
Sodium: 141 mmol/L (ref 135–145)

## 2021-04-19 LAB — CBC
HCT: 41.9 % (ref 39.0–52.0)
Hemoglobin: 12.5 g/dL — ABNORMAL LOW (ref 13.0–17.0)
MCH: 25.4 pg — ABNORMAL LOW (ref 26.0–34.0)
MCHC: 29.8 g/dL — ABNORMAL LOW (ref 30.0–36.0)
MCV: 85 fL (ref 80.0–100.0)
Platelets: 230 10*3/uL (ref 150–400)
RBC: 4.93 MIL/uL (ref 4.22–5.81)
RDW: 20 % — ABNORMAL HIGH (ref 11.5–15.5)
WBC: 14 10*3/uL — ABNORMAL HIGH (ref 4.0–10.5)
nRBC: 0 % (ref 0.0–0.2)

## 2021-04-19 NOTE — Patient Instructions (Signed)
Reginald [name],  It was great to talk to you today!  Please call me with any questions or concerns.   Visit Information  Following are the goals we discussed today:  Patient Goals/Self-Care Activities Over the next 90 days, patient will:  take medications as prescribed check glucose twice daily (fasting and bedtime), document, and provide at future appointments check blood pressure at least once daily, document, and provide at future appointments weigh daily, and contact provider if weight gain of 3 lbs in a day or more than 5 lbs in a week  Plan: Face to Face appointment with care management team member scheduled for: 05/31/21  Kennon Holter, PharmD, [place] Clinical Pharmacist [facility] [phone]   Please call the care guide team at [phone] if you need to cancel or reschedule your appointment.   Patient verbalizes understanding of instructions provided today and agrees to view in [place].

## 2021-04-19 NOTE — Chronic Care Management (AMB) (Signed)
Chronic Care Management Pharmacy Note  04/19/2021 Name:  Reginald [name] MRN:  [id number] DOB:  07-24-1970  Summary: Diabetes: Current glucose readings over last week: mostly 120s-170s fasting; elevated some due to increased sugar consumption for thanksgiving last week Continue metformin 850 mg by mouth twice daily, glipizide XL 5 mg by mouth with breakfast, Jardiance 10 mg by mouth daily and semaglutide (Rybelsus) 14 mg by mouth at least 30 minutes before the first food, beverage, or other oral medications of the day with no more than 4 ounces of plain water only  Hypertension: He has CPAP machine which was recently adjusted by Dr. Elsworth Soho. Patient reports he is sleeping better now Reports some hypotension/dizziness lately. Lisinopril on hold. Will continue to monitor. Consider restarting lisinopril once daily once renal function stabilizes   Heart failure with preserved ejection fraction (LVEF >50% with evidence of spontaneous or provokable increased LV filling pressures): Lisinopril recently discontinued upon hospital discharge and is still being held Consider restarting lisinopril 20 mg by mouth once daily once renal function stabilizes  Follow cardiology recommendations regarding torsemide and metolazone  Subjective: Reginald [name] is an 50 y.o. year old male who is a primary patient of [name], MD.  The CCM team was consulted for assistance with disease management and care coordination needs.    Engaged with patient by telephone for follow up visit in response to provider referral for pharmacy case management and/or care coordination services.   Consent to Services:  The patient was given information about Chronic Care Management services, agreed to services, and gave verbal consent prior to initiation of services.  Please see initial visit note for detailed documentation.   Patient Care Team: [name], MD as PCP - General (Internal Medicine) Thompson Grayer, MD as PCP  - Electrophysiology (Cardiology) Josue Hector, MD as PCP - Cardiology (Cardiology) Kassie Mends, RN as [place] Management [place], Ranae Pila, LCSW as Social Worker (Licensed Clinical Social Worker) Beryle Lathe, [facility] (Pharmacist)  Objective:  Lab Results  Component Value Date   CREATININE 1.58 (H) 04/19/2021   CREATININE 2.17 (H) 03/25/2021   CREATININE 1.80 (H) 03/24/2021    Lab Results  Component Value Date   HGBA1C 6.4 (H) 03/24/2021   Last diabetic Eye exam: No results found for: HMDIABEYEEXA  Last diabetic Foot exam: No results found for: HMDIABFOOTEX      Component Value Date/Time   CHOL 97 03/25/2021 0539   CHOL 123 06/10/2020 0837   TRIG 138 03/25/2021 0539   HDL 27 (L) 03/25/2021 0539   HDL 25 (L) 06/10/2020 0837   CHOLHDL 3.6 03/25/2021 0539   VLDL 28 03/25/2021 0539   LDLCALC 42 03/25/2021 0539   LDLCALC 54 06/10/2020 0837    Hepatic Function Latest Ref Rng & Units 03/25/2021 11/24/2020 11/15/2020  Total Protein 6.5 - 8.1 g/dL 7.5 7.9 8.0  Albumin 3.5 - 5.0 g/dL 3.2(L) 4.3 3.6  AST 15 - 41 U/L 32 41(H) 32  ALT 0 - 44 U/L '20 27 23  ' Alk Phosphatase 38 - 126 U/L 75 98 76  Total Bilirubin 0.3 - 1.2 mg/dL 1.9(H) 0.7 1.4(H)  Bilirubin, Direct 0.0 - 0.2 mg/dL - - -    Lab Results  Component Value Date/Time   TSH 4.060 10/04/2020 09:43 AM   TSH 5.240 (H) 06/10/2020 08:37 AM   FREET4 1.30 10/04/2020 09:43 AM   FREET4 0.79 (L) 05/03/2018 02:04 PM    CBC Latest Ref  Rng & Units 04/19/2021 03/25/2021 03/24/2021  WBC 4.0 - 10.5 K/uL 14.0(H) 20.6(H) 20.7(H)  Hemoglobin 13.0 - 17.0 g/dL 12.5(L) 11.1(L) 12.6(L)  Hematocrit 39.0 - 52.0 % 41.9 36.7(L) 41.0  Platelets 150 - 400 K/uL 230 239 310    No results found for: VD25OH  Clinical ASCVD: No  The ASCVD Risk score (Arnett DK, et al., 2019) failed to calculate for the following reasons:   The valid total cholesterol range is 130 to 320 mg/dL     Social History   Tobacco Use   Smoking Status Former   Packs/day: 2.00   Years: 16.00   Pack years: 32.00   Types: Cigarettes   Quit date: 08/23/2017   Years since quitting: 3.6  Smokeless Tobacco Never   BP Readings from Last 3 Encounters:  04/19/21 120/84  04/05/21 129/80  04/01/21 138/80   Pulse Readings from Last 3 Encounters:  04/19/21 70  04/05/21 72  04/01/21 77   Wt Readings from Last 3 Encounters:  04/19/21 (!) 486 lb 12.8 oz (220.8 kg)  04/01/21 (!) 492 lb 0.6 oz (223.2 kg)  03/14/21 (!) 493 lb (223.6 kg)    Assessment: Review of patient past medical history, allergies, medications, health status, including review of consultants reports, laboratory and other test data, was performed as part of comprehensive evaluation and provision of chronic care management services.   SDOH:  (Social Determinants of Health) assessments and interventions performed:    CCM Care Plan  No Known Allergies  Medications Reviewed Today     Reviewed by Beryle Lathe, [facility] (Pharmacist) on 04/19/21 at 1446  Med List Status: <None>   Medication Order Taking? Sig Documenting Provider Last Dose Status Informant  acetaminophen (TYLENOL) 325 MG tablet [id number] Yes Take 650 mg by mouth every 6 (six) hours as needed for mild pain. [provider] Taking Active Self  albuterol (PROVENTIL) (2.5 MG/3ML) 0.083% nebulizer solution [id number] Yes INHALE 1 VIAL VIA NEBULIZER EVERY 6 HOURS AS NEEDED FOR WHEEZING OR SHORTNESS OF BREATH [name], MD Taking Active Self  albuterol (VENTOLIN HFA) 108 (90 Base) MCG/ACT inhaler [id number] Yes INHALE 2 PUFFS INTO THE LUNGS EVERY 6 (SIX) HOURS AS NEEDED FOR WHEEZING OR SHORTNESS OF BREATH. [name], MD Taking Active Self           Med Note Lia Hopping, Toniann Fail   Fri Jan 21, 2021  8:16 AM)    allopurinol (ZYLOPRIM) 300 MG tablet [id number] Yes TAKE 1 TABLET EVERY DAY [name], MD Taking Active   ammonium lactate (AMLACTIN) 12 % lotion [id number] Yes Apply 1  application topically as needed for dry skin. Felipa Furnace, DPM Taking Active Self  atorvastatin (LIPITOR) 40 MG tablet [id number] Yes TAKE 1 TABLET EVERY DAY [name], MD Taking Active Self  Blood Glucose Monitoring Suppl (TRUE METRIX METER) w/Device KIT [id number]  USE AS DIRECTED [name], MD  Active Self  buPROPion (WELLBUTRIN XL) 300 MG 24 hr tablet [id number] Yes Take 1 tablet (300 mg total) by mouth daily. [name], MD Taking Active Self  diclofenac Sodium (VOLTAREN) 1 % GEL [id number] No Apply 2 g topically 4 (four) times daily.  Patient not taking: Reported on 04/19/2021   Mordecai Rasmussen, MD Not Taking Active Self  docusate sodium (COLACE) 100 MG capsule [id number] Yes Take 2 capsules (200 mg total) by mouth 2 (two) times daily. Thurnell Lose, MD Taking Active Self  Med Note Celesta [facility] M   Mon Feb 21, 2021  3:06 PM) 2 once a day  empagliflozin (JARDIANCE) 10 MG TABS tablet [id number] Yes Take 1 tablet (10 mg total) by mouth daily before breakfast. Erma Heritage, PA-C Taking Active Self           Med Note Jim Like Mar 03, 2021  9:34 AM) Gets from patient assistance ([employer])  FEROSUL 325 (65 Fe) MG tablet [id number] Yes TAKE 1 TABLET EVERY DAY [name], MD Taking Active Self  fexofenadine (ALLEGRA) 180 MG tablet [id number] Yes Take 180 mg by mouth daily. [provider] Taking Active Self  gabapentin (NEURONTIN) 100 MG capsule [id number] Yes TAKE 1 CAPSULE IN THE MORNING AND TAKE 2 CAPSULES IN THE EVENING [name], MD Taking Active Self  glipiZIDE (GLUCOTROL XL) 5 MG 24 hr tablet [id number] Yes Take 1 tablet (5 mg total) by mouth daily with breakfast. [name], MD Taking Active Self  hydrOXYzine (ATARAX/VISTARIL) 25 MG tablet [id number] Yes Take 1 tablet (25 mg total) by mouth every 8 (eight) hours as needed. [name], MD Taking Active Self  Krill Oil 350 MG CAPS [id number] Yes Take 1 capsule by  mouth in the morning and at bedtime.  [provider] Taking Active Self           Med Note Lia Hopping, MINDY L   Fri Jan 21, 2021  8:16 AM)    lidocaine-prilocaine (EMLA) cream [id number] No Apply 1 application topically as needed.  Patient not taking: Reported on 04/19/2021   Chase Picket, MD Not Taking Active Self  metFORMIN (GLUCOPHAGE) 850 MG tablet [id number] Yes TAKE 1 TABLET TWICE DAILY WITH MEALS [name], MD Taking Active Self  metolazone (ZAROXOLYN) 5 MG tablet [id number] Yes TAKE 1 TABLET (5 MG TOTAL) BY MOUTH THREE TIMES A WEEK. Thompson Grayer, MD Taking Active Self           Med Note Currie Paris Apr 19, 2021  2:43 PM) Using as needed of cardiology   metoprolol tartrate (LOPRESSOR) 100 MG tablet [id number] Yes TAKE 1 TABLET TWICE DAILY Josue Hector, MD Taking Active Self  pantoprazole (PROTONIX) 40 MG tablet [id number] Yes Take 1 tablet (40 mg total) by mouth daily. [name], MD Taking Active Self  polyethylene glycol (MIRALAX) 17 g packet [id number] Yes Take 17 g by mouth daily as needed. Thurnell Lose, MD Taking Active Self  potassium chloride SA (KLOR-CON) 20 MEQ tablet [id number] Yes TAKE 3 TABS DAILY AND TAKE AN ADDITIONAL 1 TAB ON THE DAY YOU TAKE YOUR WEEKLY METOLAZONE DOSE Ahmed Prima, Fransisco Hertz, PA-C Taking Active Self  rivaroxaban (XARELTO) 20 MG TABS tablet [id number] Yes TAKE 1 Tablet BY MOUTH ONCE EVERY DAY WITH SUPPER Josue Hector, MD Taking Active Self  Semaglutide (RYBELSUS) 14 MG TABS [id number] Yes Take 14 mg by mouth in the morning. [name], MD Taking Active Self           Med Note Jim Like Mar 03, 2021  9:35 AM) Gets from patient assistance ([employer])  tamsulosin [facility]) 0.4 MG CAPS capsule [id number] Yes Take 1 capsule (0.4 mg total) by mouth daily. Stoneking, Reece Leader., MD Taking Active   terbinafine (LAMISIL) 250 MG tablet [id number] No Take 1 tablet by mouth once daily  Patient not taking:  Reported on 04/19/2021   [name], MD Not  Taking Active Self  Tiotropium Bromide Monohydrate (SPIRIVA RESPIMAT) 2.5 MCG/ACT AERS [id number] Yes Inhale 2 puffs into the lungs daily. [name], MD Taking Active Self           Med Note Jim Like Mar 03, 2021  9:34 AM) Gets from patient assistance [facility] Cares)  torsemide (DEMADEX) 20 MG tablet [id number] Yes Take 2 tablets (40 mg total) by mouth 2 (two) times daily. Erma Heritage, [place] Taking Active Self  TRUE METRIX BLOOD GLUCOSE TEST test strip [id number]  TEST ONE TIME DAILY FOR DIABETES [name], MD  Active Self  TRUEplus Lancets 33G MISC [id number]  USE TO TEST ONE TIME DAILY FOR DIABETES [name], MD  Active Self            Patient Active Problem List   Diagnosis Date Noted   BPH with obstruction/lower urinary tract symptoms 04/05/2021   Acute on chronic diastolic CHF (congestive heart failure) ([place]) 03/25/2021   Morbid obesity with BMI of 60.0-69.9, adult ([place]) 03/25/2021   UTI (urinary tract infection) 03/24/2021   Lactic acidosis 03/24/2021   Leukocytosis 03/24/2021   Hyperglycemia due to diabetes mellitus ([place]) 03/24/2021   Mixed hyperlipidemia 03/24/2021   Hematuria 03/24/2021   Atrial fibrillation, chronic (HCC) 03/24/2021   Prolonged QT interval 03/24/2021   Iron deficiency anemia 02/21/2021   Constipation 02/21/2021   Rectal bleeding    Chronic blood loss anemia 11/12/2020   Gastrointestinal hemorrhage 10/08/2020   S/P placement of cardiac pacemaker 02/10/2020   Encounter for examination following treatment at hospital 02/10/2020   Anxiety 02/10/2020   DM2 (diabetes mellitus, type 2) ([place]) 02/10/2020   OSA on CPAP    Abnormal liver function 03/16/2019   History of pulmonary embolism 03/16/2019   Paroxysmal atrial fibrillation ([place]) 09/05/2018   COPD (chronic obstructive pulmonary disease) ([place]) 05/07/2018   Gastroesophageal reflux disease    Pulmonary embolism ([place])  09/18/2017   Encounter for therapeutic drug monitoring 09/04/2017   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 08/23/2017   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   Pulmonary hypertension ([place])    Chronic periodontitis 07/18/2017   Morbid obesity ([place])    Essential hypertension    Tobacco abuse    Chronic heart failure with preserved ejection fraction (HFpEF) ([place])    Chronic venous insufficiency     Immunization History  Administered Date(s) Administered   Influenza Inj Mdck Quad Pf 02/27/2019   Influenza,inj,Quad PF,6+ Mos 02/10/2020   Janssen (J&J) SARS-COV-2 Vaccination 09/16/2019   PFIZER(Purple Top)SARS-COV-2 Vaccination 03/15/2020   Pneumococcal Polysaccharide-23 03/17/2019    Conditions to be addressed/monitored: Atrial Fibrillation, CHF, HTN, COPD, DMII, Anxiety, and Depression  Care Plan : Medication Management  Updates made by Beryle Lathe, [place] since 04/19/2021 12:00 AM     Problem: COPD, HFpEF, HTN, T2DM, Afib, anxiety/depression   Priority: High  Onset Date: 12/28/2020     Long-Range Goal: Disease Progression Prevention   Start Date: 12/28/2020  Expected End Date: 03/28/2021  Recent Progress: On track  Priority: High  Note:   Current Barriers:  Unable to independently monitor therapeutic efficacy  Pharmacist Clinical Goal(s):  Over the next 90 days, patient will achieve adherence to monitoring guidelines and medication adherence to achieve therapeutic efficacy through collaboration with PharmD and provider.   Interventions: 1:1 collaboration with [name], MD regarding development and update of comprehensive plan of care as evidenced by provider attestation and  co-signature Inter-disciplinary care team collaboration (see longitudinal plan of care) Comprehensive medication review performed; medication list updated in electronic medical record  Diabetes: Current medications:  metformin 850 mg by mouth twice daily, glipizide XL 5 mg by mouth with breakfast, Jardiance 10 mg by mouth daily (receives from [employer]), and semaglutide (Rybelsus) (receives from [employer]) 14 mg by mouth daily   Intolerances: none Taking medications as directed: yes Side effects thought to be attributed to current medication regimen: GI upset with metformin but ok with continuing for now given benefits Patient denies recent hypoglycemia (lowest home blood glucose 122) Current meal patterns: breakfast: eggs, oatmeal, chicken sandwhich, and baked beans ; lunch:  ribeye and [employer] Breast in air fryer ; dinner:  leftovers from lunch ; snacks: none; drinks: water, regular soda, milk, and sports drinks Has noticed decreased appetite since starting Rybelsus. Is not having any nausea or vomiting Current exercise: none Controlled; Most recent A1c at goal of <7% per ADA guidelines Current glucose readings over last week: mostly 120s-170s fasting; elevated some due to increased sugar consumption for thanksgiving last week Hypoglycemia: I have discussed with the patient how to treat hypoglycemia by the rule of 15; eat/drink 15g of sugar in the form of glucose tabs, 4 ounces of juice or soda and recheck fingerstick glucose in 15 minutes. Chocolate bars and ice cream should be avoided because fat delays carbohydrate digestion and absorption. Retreat if glucose remains low. Driving should cease until glucose is normal. Continue metformin 850 mg by mouth twice daily, glipizide XL 5 mg by mouth with breakfast, Jardiance 10 mg by mouth daily and semaglutide (Rybelsus) 14 mg by mouth at least 30 minutes before the first food, beverage, or other oral medications of the day with no more than 4 ounces of plain water only Instructed to monitor blood sugars once a day at the following times: fasting (at least 8 hours since last food consumption), bedtime, and whenever patient experiences symptoms of  hypo/hyperglycemia Encouraged regular aerobic exercise with a goal of 30 minutes five times per week (150 minutes per week)  Hypertension: Current medications: metoprolol tartrate 100 mg by mouth twice daily Lisinopril recently discontinued upon hospital discharge and is still being held Patient also takes diuretics (torsemide twice daily + metolazone as needed) He has CPAP machine which was recently adjusted by Dr. Elsworth Soho. Patient reports he is sleeping better now Intolerances: none Taking medications as directed: yes Side effects thought to be attributed to current medication regimen: no Reports some hypotension/dizziness lately. Lisinopril on hold. Will continue to monitor. Current exercise: none Recent home blood pressure readings: 130s/80s Factors affecting control of BP include high salt intake, elevated BMI, lack of exercise, sleep apnea, and volume overload. Blood pressure under improved control.  Blood pressure is at goal of <130/80 mmHg per 2017 AHA/ACC guidelines. Continue diuretics per cardiology instructions Consider restarting lisinopril once daily once renal function stabilizes  Encourage dietary sodium restriction/DASH diet. Recommend regular aerobic exercise Recommend home blood pressure monitoring, to bring results in next visit Discussed need for and importance of continued work on weight loss Discussed need for medication compliance Reviewed risks of hypertension, principles of treatment and consequences of untreated hypertension  Heart failure with preserved ejection fraction (LVEF >50% with evidence of spontaneous or provokable increased LV filling pressures): Appropriately managed Has pacemaker Current treatment: metoprolol tartrate 100 mg by mouth twice daily, and empagliflozin (Jardiance) 10 mg by mouth once daily Lisinopril recently discontinued upon hospital discharge and is still being  held Diuretic therapy: Torsemide twice daily and metolazone as needed for  fluid  Stage C (Symptomatic heart failure)/NYHA Class III (Marked limitation of physical activity. Comfortable at rest. Less than ordinary activity causes fatigue, palpitation, or dyspnea) Most recent echocardiogram was in March 2022. LVEF was stable at 60-65%. BNP: 67 (01/30/21) Current home vitals: BP 130s/80s Current home weights: mostly stable 480s Reports shortness of breath with activity or when lying down, fatigue and weakness, swelling in the legs, ankles and feet, and reduced ability to exercise. Denies persistent cough or wheezing, nausea and lack of appetite, and chest pain Continue Jardiance 10 mg by mouth daily and metoprolol tartrate 100 mg by mouth twice daily Consider restarting lisinopril 20 mg by mouth once daily once renal function stabilizes  Follow cardiology recommendations regarding torsemide and metolazone Continue to watch serum creatinine and potassium levels closely Encourage dietary sodium restriction (<3 g/day) Educated on the importance of weighing daily. Patient aware to contact cardiology/primary care team if weight gain >3 lbs in 1 day or >5 lbs in 1 week Recommend home blood pressure monitoring, to bring results in next visit Discussed need for and importance of continued work on weight loss Discussed need for medication compliance Recommend restricted fluid intake (eg, 2 L/day)  Chronic Obstructive Pulmonary Disease: Uncontrolled - complicated by possible nasal obstruction. ENT referral in process to investigate. Plans to see Dr. Benjamine Mola in [place] soon.  Patient sleeping better with CPAP adjustments per Dr. Elsworth Soho Stopped smoking 08/23/2017; smoked for 30 years (1 PPD) Current treatment: albuterol metered dose (ProAir, Ventolin, Proventil) 1 puff by mouth as needed for shortness of breath and tiotropium (Spiriva Respimat) 2 inhalations once daily (Receives from [employer]) Patient reports only using albuterol a couple times per week but often sounds out of breath  which is likely complicated by his heart failure GOLD Classification: unknown CAT score (12/28/20): 23 Most recent Pulmonary Function Testing: N/A 0 exacerbations requiring treatment in the last 6 months  Current oxygen requirements: none Continue Spiriva 2 puffs once daily and albuterol as needed for shortness of breath   Patient was instructed on the appropriate inhaler technique for Spiriva Respimat on 02/08/21  Atrial Fibrillation: Controlled Current rate control: metoprolol tartrate 100 mg by mouth twice daily Most recent ECG: normal sinus Anticoagulation: rivaroxaban (Xarelto) 20 mg by mouth once daily Denies signs and symptoms of bleeding CHADS2VASc score: 3 (CHF, hypertension, and diabetes) Prior ablation: '[]'  Yes  '[x]'  No Home blood pressure: 130s/80s Home heart rate: unknown Continue metoprolol  Continue rivaroxaban (Xarelto) 20 mg by mouth once daily Encouraged regular physical activity for general health benefits Recommend home blood pressure and heart rate monitoring, to bring results in next visit Discussed need for medication compliance Patient may qualify for patient assistance for Xarelto after he has spent at least 4% of his gross annual income at the pharmacy.  Anxiety/Depression: Stable per patient. Follows with behavioral health Current medication: bupropion XL 300 mg by mouth daily and hydroxyzine 25 mg by mouth every 8 hours  Patient reports that hydroxyzine helps him a lot but makes him drowsy at times.  Patient Goals/Self-Care Activities Over the next 90 days, patient will:  take medications as prescribed check glucose twice daily (fasting and bedtime), document, and provide at future appointments check blood pressure at least once daily, document, and provide at future appointments weigh daily, and contact provider if weight gain of 3 lbs in a day or more than 5 lbs in a week  Follow Up  Plan: Face to Face appointment with care management team member  scheduled for:  05/31/21      Medication Assistance:  see care plan  Patient's preferred pharmacy is:  [facility] [address] [place] [address] Phone: [phone] Fax: [phone]  [facility] [place], [address] [place] [address] Phone: [phone] Fax: [place], [place] [address] Phone: [phone] Fax: [phone]  Follow Up:  Patient agrees to Care Plan and Follow-up.  Plan: Face to Face appointment with care management team member scheduled for: 05/31/21  Kennon Holter, PharmD, [place] Pharmacist [facility] [phone]

## 2021-04-19 NOTE — Progress Notes (Signed)
Cardiology Office Note    Date:  04/19/2021   ID:  Reginald [name], DOB [birth year], MRN [id number]  PCP:  [name], MD  Cardiologist: Jenkins Rouge, MD    Chief Complaint  Patient presents with   Follow-up    3 month visit    History of Present Illness:    Reginald [name] is a 50 y.o. male with past medical history of severe AS (s/p bovine tissue AVR in 08/2017 with repair of ascending thoracic aortic aneurysm), normal coronary arteries by cardiac catheterization in 2019, HFpEF, paroxysmal atrial fibrillation, CHB (s/p St. Jude PPM placement in 08/2017), history of bilateral PE, HTN, morbid obesity and OSA (on CPAP) who presents to the office today for 32-monthfollow-up.   He was examined by myself in 01/2021 following a recent ED evaluation and reported worsening dyspnea on exertion and lower extremity edema over the past week with an associated 10+ pound weight gain. He was encouraged to resume Torsemide 40 mg twice daily and to take Metolazone that day along with close follow-up labs. His creatinine had overall been stable at 1.55 and it was recommended that he only take Metolazone once weekly and continue Torsemide at current dosing.  [facility] along with hematuria and a UTI. He left AMA on 03/25/2021.  In talking with the patient today, he reports his breathing has overall been stable since his recent hospitalization earlier this month. Says he felt like being more active yesterday and enjoyed working on his truck and ran errands around town.  He does report occasional episodes of chest pain which occur at rest. No recent palpitations, orthopnea or PND. His edema has overall been well-controlled. He has been taking Torsemide 40 mg twice daily and has not had to utilize Metolazone in the past few weeks.   Past Medical History:  Diagnosis Date   Anxiety    Aortic stenosis    Arthritis    Asthma    Back pain    Cellulitis  of skin with lymphangitis    CHF (congestive heart failure) (HCC)    Chronic diastolic congestive heart failure (HCC)    Chronic venous insufficiency    Constipation    COPD (chronic obstructive pulmonary disease) (HCC)    Depression    Diabetes ([place]    Dyspnea    Essential hypertension    Gout    Heart murmur    Hypertension    Morbid obesity ([place]    Obesity    Pneumonia    walking pneumonia   Prostatitis    Pulmonary embolism ([place]    Pulmonary hypertension (HCC)    Pyelonephritis    S/P aortic valve replacement with bioprosthetic valve 08/23/2017   25 mm Edwards Inspiris Resilia stented bovine pericardial tissue valve   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   24 mm Hemashield supracoronary straight graft    Sleep apnea    cpap    Thoracic ascending aortic aneurysm    Thoracic ascending aortic aneurysm    Tobacco abuse     Past Surgical History:  Procedure Laterality Date   AORTIC VALVE REPLACEMENT N/A 08/23/2017   Procedure: AORTIC VALVE REPLACEMENT (AVR) USING INSPIRIS RESILIA AORTIC VALVE SIZE 25 MM;  Surgeon: ORexene Alberts MD;  [place]  Service: Open Heart Surgery;  Laterality: N/A;   CARDIAC VALVE REPLACEMENT N/A    Phreesia 02/07/2020   COLONOSCOPY WITH PROPOFOL N/A 11/14/2020  Procedure: COLONOSCOPY WITH PROPOFOL;  Surgeon: Gatha Mayer, MD;  [facility] END[facility]COPY;  Service: Endoscopy;  Laterality: N/A;   ESOPHAGOGASTRODUODENOSCOPY (EGD) WITH PROPOFOL N/A 11/14/2020   Procedure: ESOPHAGOGASTRODUODENOSCOPY (EGD) WITH PROPOFOL;  Surgeon: Gatha Mayer, MD;  Location: [place];  Service: Endoscopy;  Laterality: N/A;   MULTIPLE EXTRACTIONS WITH ALVEOLOPLASTY N/A 07/23/2017   Procedure: Extraction of tooth #10 with alveoloplasty and gross debridement of remaining teeth;  Surgeon: Lenn Cal, DDS;  Location: [place];  Service: Oral Surgery;  Laterality: N/A;   PACEMAKER IMPLANT N/A 08/28/2017    St Jude Medical Assurity MRI  conditional  dual-chamber pacemaker for symptomatic complete heart blockby Dr Rayann Heman   TEE WITHOUT CARDIOVERSION N/A 07/16/2017   Procedure: TRANSESOPHAGEAL ECHOCARDIOGRAM (TEE);  Surgeon: Lelon Perla, MD;  Location[facility] ENDOSCOPY;  Service: Cardiovascular;  Laterality: N/A;   TEE WITHOUT CARDIOVERSION N/A 08/23/2017   Procedure: TRANSESOPHAGEAL ECHOCARDIOGRAM (TEE);  Surgeon: Rexene Alberts, MD;  Location: [place];  Service: Open Heart Surgery;  Laterality: N/A;   THORACIC AORTIC ANEURYSM REPAIR N/A 08/23/2017   Procedure: THORACIC ASCENDING ANEURYSM REPAIR (AAA) USING HEMASHIELD GOLD KNITTED MICROVEL DOUBLE VELOUR VASCULAR GRAFT D: 24 MM  L: 30 CM;  Surgeon: Rexene Alberts, MD;  Location: [place];  Service: Open Heart Surgery;  Laterality: N/A;    Current Medications: Outpatient Medications Prior to Visit  Medication Sig Dispense Refill   acetaminophen (TYLENOL) 325 MG tablet Take 650 mg by mouth every 6 (six) hours as needed for mild pain.     albuterol (PROVENTIL) (2.5 MG/3ML) 0.083% nebulizer solution INHALE 1 VIAL VIA NEBULIZER EVERY 6 HOURS AS NEEDED FOR WHEEZING OR SHORTNESS OF BREATH 270 mL 1   albuterol (VENTOLIN HFA) 108 (90 Base) MCG/ACT inhaler INHALE 2 PUFFS INTO THE LUNGS EVERY 6 (SIX) HOURS AS NEEDED FOR WHEEZING OR SHORTNESS OF BREATH. 1 each 1   allopurinol (ZYLOPRIM) 300 MG tablet TAKE 1 TABLET EVERY DAY 90 tablet 0   ammonium lactate (AMLACTIN) 12 % lotion Apply 1 application topically as needed for dry skin. 400 g 0   atorvastatin (LIPITOR) 40 MG tablet TAKE 1 TABLET EVERY DAY 90 tablet 3   Blood Glucose Monitoring Suppl (TRUE METRIX METER) w/Device KIT USE AS DIRECTED 1 kit 0   buPROPion (WELLBUTRIN XL) 300 MG 24 hr tablet Take 1 tablet (300 mg total) by mouth daily. 90 tablet 3   docusate sodium (COLACE) 100 MG capsule Take 2 capsules (200 mg total) by mouth 2 (two) times daily. 30 capsule 0   empagliflozin (JARDIANCE) 10 MG TABS tablet Take 1 tablet (10 mg total) by  mouth daily before breakfast. 30 tablet 11   FEROSUL 325 (65 Fe) MG tablet TAKE 1 TABLET EVERY DAY 90 tablet 1   fexofenadine (ALLEGRA) 180 MG tablet Take 180 mg by mouth daily.     gabapentin (NEURONTIN) 100 MG capsule TAKE 1 CAPSULE IN THE MORNING AND TAKE 2 CAPSULES IN THE EVENING 270 capsule 0   glipiZIDE (GLUCOTROL XL) 5 MG 24 hr tablet Take 1 tablet (5 mg total) by mouth daily with breakfast.     hydrOXYzine (ATARAX/VISTARIL) 25 MG tablet Take 1 tablet (25 mg total) by mouth every 8 (eight) hours as needed. 90 tablet 2   Krill Oil 350 MG CAPS Take 1 capsule by mouth in the morning and at bedtime.      metFORMIN (GLUCOPHAGE) 850 MG tablet TAKE 1 TABLET TWICE DAILY WITH MEALS 180 tablet 1  metolazone (ZAROXOLYN) 5 MG tablet TAKE 1 TABLET (5 MG TOTAL) BY MOUTH THREE TIMES A WEEK. 36 tablet 3   metoprolol tartrate (LOPRESSOR) 100 MG tablet TAKE 1 TABLET TWICE DAILY 180 tablet 3   pantoprazole (PROTONIX) 40 MG tablet Take 1 tablet (40 mg total) by mouth daily. 90 tablet 3   polyethylene glycol (MIRALAX) 17 g packet Take 17 g by mouth daily as needed. 14 each 0   potassium chloride SA (KLOR-CON) 20 MEQ tablet TAKE 3 TABS DAILY AND TAKE AN ADDITIONAL 1 TAB ON THE DAY YOU TAKE YOUR WEEKLY METOLAZONE DOSE 283 tablet 3   rivaroxaban (XARELTO) 20 MG TABS tablet TAKE 1 Tablet BY MOUTH ONCE EVERY DAY WITH SUPPER 90 tablet 3   Semaglutide (RYBELSUS) 14 MG TABS Take 14 mg by mouth in the morning. 90 tablet 0   tamsulosin (FLOMAX) 0.4 MG CAPS capsule Take 1 capsule (0.4 mg total) by mouth daily. 30 capsule 11   Tiotropium Bromide Monohydrate (SPIRIVA RESPIMAT) 2.5 MCG/ACT AERS Inhale 2 puffs into the lungs daily. 4 g 5   torsemide (DEMADEX) 20 MG tablet Take 2 tablets (40 mg total) by mouth 2 (two) times daily. 360 tablet 3   TRUE METRIX BLOOD GLUCOSE TEST test strip TEST ONE TIME DAILY FOR DIABETES 100 strip 5   TRUEplus Lancets 33G MISC USE TO TEST ONE TIME DAILY FOR DIABETES 100 each 5   diclofenac  Sodium (VOLTAREN) 1 % GEL Apply 2 g topically 4 (four) times daily. (Patient not taking: Reported on 04/19/2021) 150 g 1   lidocaine-prilocaine (EMLA) cream Apply 1 application topically as needed. (Patient not taking: Reported on 04/19/2021) 30 g 0   terbinafine (LAMISIL) 250 MG tablet Take 1 tablet by mouth once daily (Patient not taking: Reported on 04/19/2021) 28 tablet 0   cefdinir (OMNICEF) 300 MG capsule Take 1 capsule (300 mg total) by mouth 2 (two) times daily. (Patient not taking: Reported on 04/19/2021) 10 capsule 0   No facility-administered medications prior to visit.     Allergies:   Patient has no known allergies.   Social History   Socioeconomic History   Marital status: Divorced    Spouse name: Not on file   Number of children: Not on file   Years of education: Not on file   Highest education level: Not on file  Occupational History   Not on file  Tobacco Use   Smoking status: Former    Packs/day: 2.00    Years: 16.00    Pack years: 32.00    Types: Cigarettes    Quit date: 08/23/2017    Years since quitting: 3.6   Smokeless tobacco: Never  Vaping Use   Vaping Use: Never used  Substance and Sexual Activity   Alcohol use: No    Comment: have had alcohol in the past, not heavy   Drug use: No   Sexual activity: Not Currently    Partners: Female  Other Topics Concern   Not on file  Social History Narrative   Not on file   Social Determinants of Health   Financial Resource Strain: Low Risk    Difficulty of Paying Living Expenses: Not hard at all  Food Insecurity: No Food Insecurity   Worried About Charity fundraiser in the Last Year: Never true   [place] in the Last Year: Never true  Transportation Needs: No Transportation Needs   Lack of Transportation (Medical): No   Lack of Transportation (Non-Medical): No  Physical Activity: Inactive   Days of Exercise per Week: 0 days   Minutes of Exercise per Session: 0 min  Stress: No Stress Concern  Present   Feeling of Stress : Only a little  Social Connections: Socially Isolated   Frequency of Communication with Friends and Family: Once a week   Frequency of Social Gatherings with Friends and Family: Never   Attends Religious Services: Never   Printmaker: No   Attends Music therapist: More than 4 times per year   Marital Status: Never married     Family History:  [name] family history includes Alzheimer's disease in his mother; Diabetes in his brother; Heart attack in his brother and mother; Hypertension in his mother.   Review of Systems:    Please see the history of present illness.     All other systems reviewed and are otherwise negative except as noted above.   Physical Exam:    VS:  BP 120/84   Pulse 70   Ht '5\' 11"'  (1.803 m)   Wt (!) 486 lb 12.8 oz (220.8 kg)   SpO2 97%   BMI 67.89 kg/m    General: Obese male appearing in no acute distress. Head: Normocephalic, atraumatic. Neck: No carotid bruits. JVD not elevated.  Lungs: Respirations regular and unlabored, without wheezes or rales.  Heart: Regular rate and rhythm. No S3 or S4. 2/6 SEM along RUSB.  Abdomen: Appears non-distended. No obvious abdominal masses. Msk:  Strength and tone appear normal for age. No obvious joint deformities or effusions. Extremities: No clubbing or cyanosis. No pitting edema.  Distal pedal pulses are 2+ bilaterally. Neuro: Alert and oriented X 3. Moves all extremities spontaneously. No focal deficits noted. Psych:  Responds to questions appropriately with a normal affect. Skin: No rashes or lesions noted  Wt Readings from Last 3 Encounters:  04/19/21 (!) 486 lb 12.8 oz (220.8 kg)  04/01/21 (!) 492 lb 0.6 oz (223.2 kg)  03/14/21 (!) 493 lb (223.6 kg)     Studies/Labs Reviewed:   EKG:  EKG is not ordered today. EKG from 03/24/2021 is reviewed and shows normal sinus rhythm, heart rate 87 with RBBB.  Recent Labs: 10/04/2020: TSH  4.060 01/30/2021: B Natriuretic Peptide 67.0 03/25/2021: ALT 20; Magnesium 2.0 04/19/2021: BUN 28; Creatinine, Ser 1.58; Hemoglobin 12.5; Platelets 230; Potassium 4.2; Sodium 141   Lipid Panel    Component Value Date/Time   CHOL 97 03/25/2021 0539   CHOL 123 06/10/2020 0837   TRIG 138 03/25/2021 0539   HDL 27 (L) 03/25/2021 0539   HDL 25 (L) 06/10/2020 0837   CHOLHDL 3.6 03/25/2021 0539   VLDL 28 03/25/2021 0539   LDLCALC 42 03/25/2021 0539   LDLCALC 54 06/10/2020 0837    Additional studies/ records that were reviewed today include:   Echocardiogram: 07/2020 IMPRESSIONS     1. Images are limited despite use of Definity.   2. Left ventricular ejection fraction, by estimation, is 60 to 65%. The  left ventricle has normal function. Left ventricular endocardial border  not optimally defined to evaluate regional wall motion. There is mild left  ventricular hypertrophy. Left  ventricular diastolic parameters are indeterminate. Elevated left atrial  pressure.   3. Right ventricular systolic function is mildly reduced. The right  ventricular size is normal. There is normal pulmonary artery systolic  pressure. The estimated right ventricular systolic pressure is 16.1 mmHg.  Device wire present.   4. The mitral valve is  grossly normal. Trivial mitral valve  regurgitation.   5. The aortic valve has been repaired/replaced. Aortic valve  regurgitation is not visualized. There is a 25 mm Edwards valve present in  the aortic position. Aortic valve mean gradient measures 26.0 mmHg. Mean  gradient was 23 mmHg in 2019, relatively  stable - could be suggestive of patient/prosthesis mismatch. Poorly  visualized.   6. The inferior vena cava is dilated in size with >50% respiratory  variability, suggesting right atrial pressure of 8 mmHg.   Assessment:    1. Chronic diastolic (congestive) heart failure ([place])   2. Medication management   3. S/P aortic valve replacement with bioprosthetic  valve + repair ascending thoracic aortic aneurysm   4. Paroxysmal atrial fibrillation (HCC)   5. CHB (complete heart block) (HCC)   6. Hematuria, unspecified type      Plan:   In order of problems listed above:  1. HFpEF - His weight has overall been stable since recent hospitalization and breathing has been at baseline. Will continue current medication regimen with Torsemide 40 mg twice daily and he does have an Rx for PRN Metolazone but has not needed to utilize this recently. Will recheck a BMET today as his creatinine was elevated to 2.17 earlier this month during admission for sepsis. We discussed trying to limit his fluid intake to less than 60 ounces daily given his significant fluctuations in fluid status.   2. Aortic Stenosis - He is s/p bovine tissue AVR in 08/2017 with repair of ascending thoracic aortic aneurysm. Recent echocardiogram in 07/2020 showed a mean gradient of 26 mmHg which was similar to prior imaging.   3. Paroxysmal Atrial Fibrillation  - He denies any recent palpitations but does report occasional chest pain at rest which seems atypical for a cardiac etiology and prior catheterization in 2019 showed normal coronary arteries.  - Will continue Lopressor 100 mg twice daily for rate control along with Xarelto 20 mg daily for anticoagulation. Will recheck CBC and BMET today.  4. CHB - He underwent St. Jude PPM placement in 08/2017 and his device has been funciotning normally by recent interrogations. Followed by Dr. Rayann Heman.   5. Hematuria - Symptoms occurred earlier this month in the setting of a possible UTI but his urine culture was negative during admission. CT imaging also showed no acute abnormalities. He has been evaluated by Urology in the interim and started on Tamsulosin with plans for a cystoscopy. He does remain on anticoagulation at this time given no recurrent symptoms.   Medication Adjustments/Labs and Tests Ordered: Current medicines are reviewed at  length with the patient today.  Concerns regarding medicines are outlined above.  Medication changes, Labs and Tests ordered today are listed in the Patient Instructions below. Patient Instructions  Medication Instructions:  Your physician recommends that you continue on your current medications as directed. Please refer to the Current Medication list given to you today.  *If you need a refill on your cardiac medications before your next appointment, please call your pharmacy*   Lab Work: Your physician recommends that you return for lab work in: Today (BMET, CBC)   If you have labs (blood work) drawn today and your tests are completely normal, you will receive your results only by: MyChart Message (if you have MyChart) OR A paper copy in the mail If you have any lab test that is abnormal or we need to change your treatment, we will call you to review the results.   Testing/Procedures:  NONE    Follow-Up: At [facility], you and your health needs are our priority.  As part of our continuing mission to provide you with exceptional heart care, we have created designated Provider Care Teams.  These Care Teams include your primary Cardiologist (physician) and Advanced Practice Providers (APPs -  Physician Assistants and Nurse Practitioners) who all work together to provide you with the care you need, when you need it.  We recommend signing up for the patient portal called "MyChart".  Sign up information is provided on this After Visit Summary.  MyChart is used to connect with patients for Virtual Visits (Telemedicine).  Patients are able to view lab/test results, encounter notes, upcoming appointments, etc.  Non-urgent messages can be sent to your provider as well.   To learn more about what you can do with MyChart, go to [email or web address].    Your next appointment:   3 month(s)  The format for your next appointment:   In Person  Provider:   Bernerd Pho, PA-C    Other  Instructions Thank you for choosing [facility]!     Signed, Erma Heritage, PA-C  04/19/2021 4:44 PM    [facility] [address] [place], [place] [address] Phone: [phone] Fax: [phone]

## 2021-04-19 NOTE — Patient Instructions (Signed)
Medication Instructions:  Your physician recommends that you continue on your current medications as directed. Please refer to the Current Medication list given to you today.  *If you need a refill on your cardiac medications before your next appointment, please call your pharmacy*   Lab Work: Your physician recommends that you return for lab work in: Today (BMET, CBC)   If you have labs (blood work) drawn today and your tests are completely normal, you will receive your results only by: MyChart Message (if you have MyChart) OR A paper copy in the mail If you have any lab test that is abnormal or we need to change your treatment, we will call you to review the results.   Testing/Procedures: NONE    Follow-Up: At [facility], you and your health needs are our priority.  As part of our continuing mission to provide you with exceptional heart care, we have created designated Provider Care Teams.  These Care Teams include your primary Cardiologist (physician) and Advanced Practice Providers (APPs -  Physician Assistants and Nurse Practitioners) who all work together to provide you with the care you need, when you need it.  We recommend signing up for the patient portal called "MyChart".  Sign up information is provided on this After Visit Summary.  MyChart is used to connect with patients for Virtual Visits (Telemedicine).  Patients are able to view lab/test results, encounter notes, upcoming appointments, etc.  Non-urgent messages can be sent to your provider as well.   To learn more about what you can do with MyChart, go to [email or web address].    Your next appointment:   3 month(s)  The format for your next appointment:   In Person  Provider:   Bernerd Pho, PA-C    Other Instructions Thank you for choosing [facility]!

## 2021-04-20 ENCOUNTER — Ambulatory Visit: Payer: Medicare HMO | Admitting: Podiatry

## 2021-04-20 DIAGNOSIS — J449 Chronic obstructive pulmonary disease, unspecified: Secondary | ICD-10-CM | POA: Diagnosis not present

## 2021-04-20 DIAGNOSIS — I5032 Chronic diastolic (congestive) heart failure: Secondary | ICD-10-CM | POA: Diagnosis not present

## 2021-04-20 DIAGNOSIS — I10 Essential (primary) hypertension: Secondary | ICD-10-CM

## 2021-04-20 DIAGNOSIS — I480 Paroxysmal atrial fibrillation: Secondary | ICD-10-CM

## 2021-04-20 DIAGNOSIS — F339 Major depressive disorder, recurrent, unspecified: Secondary | ICD-10-CM

## 2021-04-20 DIAGNOSIS — E1169 Type 2 diabetes mellitus with other specified complication: Secondary | ICD-10-CM

## 2021-04-22 ENCOUNTER — Telehealth: Payer: Self-pay | Admitting: *Deleted

## 2021-04-22 NOTE — Telephone Encounter (Signed)
Called patient with test results. No answer. Left message to call back.  

## 2021-04-22 NOTE — Telephone Encounter (Signed)
-----   Message from Erma Heritage, [place] sent at 04/19/2021  4:22 PM EST ----- Please let the patient know his electrolytes are within a normal range. Kidney function slightly above baseline but significantly improved from his hospitalization as his creatinine was previously at 2.17 and now down to 1.58. Would continue with Torsemide at current dosing but try not to use Metolazone unless necessary. Hemoglobin and platelets stable. WBC count has improved since his recent admission as well following treatment with antibiotics.

## 2021-04-26 ENCOUNTER — Encounter: Payer: Self-pay | Admitting: Nurse Practitioner

## 2021-04-26 ENCOUNTER — Ambulatory Visit (INDEPENDENT_AMBULATORY_CARE_PROVIDER_SITE_OTHER): Payer: Medicare HMO | Admitting: Nurse Practitioner

## 2021-04-26 ENCOUNTER — Other Ambulatory Visit: Payer: Medicare HMO | Admitting: Urology

## 2021-04-26 ENCOUNTER — Ambulatory Visit: Payer: Medicare HMO | Admitting: Nurse Practitioner

## 2021-04-26 ENCOUNTER — Other Ambulatory Visit: Payer: Self-pay

## 2021-04-26 VITALS — Wt >= 6400 oz

## 2021-04-26 DIAGNOSIS — J029 Acute pharyngitis, unspecified: Secondary | ICD-10-CM | POA: Insufficient documentation

## 2021-04-26 DIAGNOSIS — R051 Acute cough: Secondary | ICD-10-CM

## 2021-04-26 DIAGNOSIS — J069 Acute upper respiratory infection, unspecified: Secondary | ICD-10-CM | POA: Diagnosis not present

## 2021-04-26 DIAGNOSIS — R310 Gross hematuria: Secondary | ICD-10-CM

## 2021-04-26 LAB — POCT RAPID STREP A (OFFICE): Rapid Strep A Screen: NEGATIVE

## 2021-04-26 LAB — POCT INFLUENZA A/B
Influenza A, POC: NEGATIVE
Influenza B, POC: NEGATIVE

## 2021-04-26 MED ORDER — BENZONATATE 100 MG PO CAPS: 100.0000 mg | ORAL_CAPSULE | Freq: Two times a day (BID) | ORAL | 0 refills | Status: DC | PRN

## 2021-04-26 NOTE — Progress Notes (Deleted)
Assessment: 1. Gross hematuria     Plan: I reviewed the patient's records from his recent hospitalization including physician notes, laboratory results, and imaging studies.  I personally reviewed the CT scan from 03/24/2021 with results as noted below. Today I had a discussion with the patient regarding the findings of gross hematuria including the implications and differential diagnoses associated with it.  I also discussed recommendations for further evaluation including the rationale for upper tract imaging and cystoscopy.  I discussed the nature of these procedures including potential risk and complications.  The patient expressed an understanding of these issues. PSA today Trial of tamsulosin 0.4 mg daily.  Rx sent. Return to office in 3-4 weeks for cystoscopy  Chief Complaint:  No chief complaint on file.   History of Present Illness:  Reginald [name] is a 50 y.o. year old male who is seen for further evaluation of gross hematuria and LUTS.  He reports a recent history of gross hematuria, dysuria, and urinary hesitancy.  His symptoms began in early November 2022.  He presented to the emergency room on 03/24/2021 and was found to be hypotensive.  Urinalysis showed >50 RBCs.  Urine culture showed no growth.  CT abdomen and pelvis without contrast from 03/24/2021 showed no renal abnormalities, no stones or hydronephrosis, normal-appearing bladder.  He was treated with antibiotics for possible UTI.  Since discharge, the gross hematuria has resolved.  He continues to report some slight dysuria and hesitancy.  He does have urinary frequency and nocturia baseline.  No prior history of stones or UTIs. AUA score = 18 today.  CT abdomen pelvis with contrast from 11/12/2020 showed no renal mass or obstruction.  Portions of the above documentation were copied from a prior visit for review purposes only.   Past Medical History:  Past Medical History:  Diagnosis Date   Anxiety    Aortic stenosis     Arthritis    Asthma    Back pain    Cellulitis of skin with lymphangitis    CHF (congestive heart failure) (HCC)    Chronic diastolic congestive heart failure (HCC)    Chronic venous insufficiency    Constipation    COPD (chronic obstructive pulmonary disease) (HCC)    Depression    Diabetes ([place])    Dyspnea    Essential hypertension    Gout    Heart murmur    Hypertension    Morbid obesity ([place])    Obesity    Pneumonia    walking pneumonia   Prostatitis    Pulmonary embolism ([place])    Pulmonary hypertension (HCC)    Pyelonephritis    S/P aortic valve replacement with bioprosthetic valve 08/23/2017   25 mm Edwards Inspiris Resilia stented bovine pericardial tissue valve   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   24 mm Hemashield supracoronary straight graft    Sleep apnea    cpap    Thoracic ascending aortic aneurysm    Thoracic ascending aortic aneurysm    Tobacco abuse     Past Surgical History:  Past Surgical History:  Procedure Laterality Date   AORTIC VALVE REPLACEMENT N/A 08/23/2017   Procedure: AORTIC VALVE REPLACEMENT (AVR) USING INSPIRIS RESILIA AORTIC VALVE SIZE 25 MM;  Surgeon: Rexene Alberts, MD;  Location: [facility];  Service: Open Heart Surgery;  Laterality: N/A;   CARDIAC VALVE REPLACEMENT N/A    [employer]reesia 02/07/2020   COLONOSCOPY WITH PROPOFOL N/A 11/14/2020   Procedure: COLONOSCOPY WITH PROPOFOL;  Surgeon: Silvano Rusk  E, MD;  Location: [facility];  Service: Endoscopy;  Laterality: N/A;   ESOPHAGOGASTRODUODENOSCOPY (EGD) WITH PROPOFOL N/A 11/14/2020   Procedure: ESOPHAGOGASTRODUODENOSCOPY (EGD) WITH PROPOFOL;  Surgeon: Gatha Mayer, MD;  Location: [facility];  Service: Endoscopy;  Laterality: N/A;   MULTIPLE EXTRACTIONS WITH ALVEOLOPLASTY N/A 07/23/2017   Procedure: Extraction of tooth #10 with alveoloplasty and gross debridement of remaining teeth;  Surgeon: Lenn Cal, DDS;  Location: [facility];  Service: Oral Surgery;  Laterality: N/A;    PACEMAKER IMPLANT N/A 08/28/2017    St Jude Medical Assurity MRI conditional  dual-chamber pacemaker for symptomatic complete heart blockby Dr Rayann Heman   TEE WITHOUT CARDIOVERSION N/A 07/16/2017   Procedure: TRANSESOPHAGEAL ECHOCARDIOGRAM (TEE);  Surgeon: Lelon Perla, MD;  Location: [facility] ENDOSCOPY;  Service: Cardiovascular;  Laterality: N/A;   TEE WITHOUT CARDIOVERSION N/A 08/23/2017   Procedure: TRANSESOPHAGEAL ECHOCARDIOGRAM (TEE);  Surgeon: Rexene Alberts, MD;  Location: [facility];  Service: Open Heart Surgery;  Laterality: N/A;   THORACIC AORTIC ANEURYSM REPAIR N/A 08/23/2017   Procedure: THORACIC ASCENDING ANEURYSM REPAIR (AAA) USING HEMASHIELD GOLD KNITTED MICROVEL DOUBLE VELOUR VASCULAR GRAFT D: 24 MM  L: 30 CM;  Surgeon: Rexene Alberts, MD;  Location: [place];  Service: Open Heart Surgery;  Laterality: N/A;    Allergies:  No Known Allergies  Family History:  Family History  Problem Relation Age of Onset   Hypertension Mother    Alzheimer's disease Mother    Heart attack Mother    Heart attack Brother    Diabetes Brother     Social History:  Social History   Tobacco Use   Smoking status: Former    Packs/day: 2.00    Years: 16.00    Pack years: 32.00    Types: Cigarettes    Quit date: 08/23/2017    Years since quitting: 3.6   Smokeless tobacco: Never  Vaping Use   Vaping Use: Never used  Substance Use Topics   Alcohol use: No    Comment: have had alcohol in the past, not heavy   Drug use: No   ROS: Constitutional:  Negative for fever, chills, weight loss CV: Negative for chest pain, previous MI, hypertension Respiratory:  Negative for shortness of breath, wheezing, sleep apnea, frequent cough GI:  Negative for nausea, vomiting, bloody stool, GERD  Physical exam: There were no vitals taken for this visit. ***   Results: ***  Procedure:  Flexible Cystourethroscopy  Pre-operative Diagnosis: {cysto diagnosis:26394}  Post-operative Diagnosis: {cysto  diagnosis:26394}  Anesthesia:  local with lidocaine jelly  Surgical Narrative:  After appropriate informed consent was obtained, the patient was prepped and draped in the usual sterile fashion in the supine position.  He was correctly identified and the proper procedure delineated prior to proceeding.  Sterile lidocaine gel was instilled in the urethra. The flexible cystoscope was introduced without difficulty.  Findings:  Anterior urethra: {anterior urethral findings:26395}  Posterior urethra: {post urethral findings:26396}  Bladder: {bladder findings:26397}  Ureteral orifices: {Normal/Abnormal Appearance:21344::"normal"}  Additional findings:  Saline bladder wash for cytology {WAS/WAS [phone]::"was not"} performed.    The cystoscope was then removed.  The patient tolerated the procedure well.

## 2021-04-26 NOTE — Assessment & Plan Note (Signed)
Strep negative. Sore throat probably from coughing

## 2021-04-26 NOTE — Assessment & Plan Note (Addendum)
Strep test negative, flu test negative.  Tessalon 100mg  BID for cough.  OTC Tylenol as needed for fever COVID test pending.

## 2021-04-26 NOTE — Progress Notes (Addendum)
Virtual Visit via Telephone Note  I connected with Reginald [name] @ on 04/26/21 at 1:40pm by telephone and verified that I am speaking with the correct person using two identifiers. I spent a total of 9 minutes talking to the patient and reviewing his chart.   Location: Patient: home Provider: office   I discussed the limitations, risks, security and privacy concerns of performing an evaluation and management service by telephone and the availability of in person appointments. I also discussed with the patient that there may be a patient responsible charge related to this service. The patient expressed understanding and agreed to proceed.   History of Present Illness: PT c/o cough sore,throat , chills, body aches, wheezing, SOB, chest tightness, some bloody sputum, bloody nasal drainage when he blows his nose , symptoms started  one week ago. He has been using nyquil and codeine blue, they helped some. He denies fever, chest pain. Bloody drainage from the nose has now stopped. He has no known exposure to covid or flu.He has not had his flu vaccine this year , no covid vaccine this year.   Observations/Objective:   Assessment and Plan: Viral upper respiratory infection  Rapid Strep negative FLU Test negative COVID test pending Tylenol as needed for fever. Tessalon 100mg  BID as needed for cough Pt told to go to the ER if he continues to have blood sputum or bloody nasal drainage    Follow Up Instructions:   I discussed the assessment and treatment plan with the patient. The patient was provided an opportunity to ask questions and all were answered. The patient agreed with the plan and demonstrated an understanding of the instructions.   The patient was advised to call back or seek an in-person evaluation if the symptoms worsen or if the condition fails to improve as anticipated.

## 2021-04-27 ENCOUNTER — Telehealth: Payer: Medicare HMO | Admitting: Nurse Practitioner

## 2021-04-28 LAB — NOVEL CORONAVIRUS, NAA: SARS-CoV-2, NAA: NOT DETECTED

## 2021-05-02 ENCOUNTER — Encounter: Payer: Self-pay | Admitting: Podiatry

## 2021-05-02 ENCOUNTER — Other Ambulatory Visit: Payer: Self-pay

## 2021-05-02 ENCOUNTER — Ambulatory Visit: Payer: Medicare HMO | Admitting: Podiatry

## 2021-05-02 DIAGNOSIS — E1142 Type 2 diabetes mellitus with diabetic polyneuropathy: Secondary | ICD-10-CM

## 2021-05-02 DIAGNOSIS — E1169 Type 2 diabetes mellitus with other specified complication: Secondary | ICD-10-CM

## 2021-05-02 DIAGNOSIS — I872 Venous insufficiency (chronic) (peripheral): Secondary | ICD-10-CM

## 2021-05-02 DIAGNOSIS — L84 Corns and callosities: Secondary | ICD-10-CM | POA: Insufficient documentation

## 2021-05-02 DIAGNOSIS — M79675 Pain in left toe(s): Secondary | ICD-10-CM

## 2021-05-02 DIAGNOSIS — M79674 Pain in right toe(s): Secondary | ICD-10-CM

## 2021-05-02 DIAGNOSIS — E1140 Type 2 diabetes mellitus with diabetic neuropathy, unspecified: Secondary | ICD-10-CM | POA: Insufficient documentation

## 2021-05-02 DIAGNOSIS — B351 Tinea unguium: Secondary | ICD-10-CM

## 2021-05-02 NOTE — Progress Notes (Signed)
This patient returns to my office for at risk foot care.  This patient requires this care by a professional since this patient will be at risk due to having venous insufficiency and diabetic neuropathy .He  has callus on the outside bottom of his feet  B/L. This patient is unable to cut nails himself since the patient cannot reach his nails.These nails are painful walking and wearing shoes. He does admit to ripping his nails off his toes when they thicken. This patient presents for at risk foot care today.  General Appearance  Alert, conversant and in no acute stress.  Vascular  Dorsalis pedis and posterior tibial  pulses are not palpable  due to swelling. bilaterally.  Capillary return is within normal limits  bilaterally. Temperature is within normal limits  bilaterally.  Neurologic  Senn-Weinstein monofilament wire test diminished/absent  bilaterally. Muscle power within normal limits bilaterally.  Nails Thick disfigured discolored nails with subungual debris  from hallux to fifth toes bilaterally. No evidence of bacterial infection or drainage bilaterally.  Orthopedic  No limitations of motion  feet .  No crepitus or effusions noted.  No bony pathology or digital deformities noted.  Skin  normotropic skin with no porokeratosis noted bilaterally.  No signs of infections or ulcers noted.   Callus  B/L.  Onychomycosis  Pain in right toes  Pain in left toes  Consent was obtained for treatment procedures.   Mechanical debridement of nails 1-5  bilaterally performed with a nail nipper.  Filed with dremel without incident.    Return office visit    3 months                  Told patient to return for periodic foot care and evaluation due to potential at risk complications.   Gardiner Barefoot DPM

## 2021-05-04 ENCOUNTER — Ambulatory Visit (INDEPENDENT_AMBULATORY_CARE_PROVIDER_SITE_OTHER): Payer: Medicare HMO | Admitting: *Deleted

## 2021-05-04 DIAGNOSIS — F419 Anxiety disorder, unspecified: Secondary | ICD-10-CM

## 2021-05-04 DIAGNOSIS — E1169 Type 2 diabetes mellitus with other specified complication: Secondary | ICD-10-CM

## 2021-05-04 DIAGNOSIS — J449 Chronic obstructive pulmonary disease, unspecified: Secondary | ICD-10-CM

## 2021-05-04 NOTE — Patient Instructions (Signed)
Visit Information  Thank you for taking time to visit with me today. Please don't hesitate to contact me if I can be of assistance to you before our next scheduled telephone appointment.  Following are the goals we discussed today:  Practice relaxation- deep breathing exercises to help calm restlessness     If you are experiencing a Mental Health or [place] [facility] or need someone to talk to, please call the Suicide and Crisis Lifeline: 988 call the [place] National Suicide Prevention Lifeline: (586) 548-3804 or TTY: 704 082 4841 TTY 845-250-1988) to talk to a trained counselor call 1-800-273-TALK (toll free, 24 hour hotline) call 911   The patient verbalized understanding of instructions, educational materials, and care plan provided today and declined offer to receive copy of patient instructions, educational materials, and care plan.   Eduard Clos MSW, LCSW Licensed Music therapist Primary Care [phone]

## 2021-05-04 NOTE — Chronic Care Management (AMB) (Signed)
Chronic Care Management    Clinical Social Work Note  05/04/2021 Name: Reginald [name] MRN: [id number] DOB: May 12, 1971  Reginald [name] is a 50 y.o. year old male who is a primary care patient of Reginald Spar, MD. The CCM team was consulted to assist the patient with chronic disease management and/or care coordination needs related to: [employer]  and [employer] [employer].   Engaged with patient by telephone for follow up visit in response to provider referral for social work chronic care management and care coordination services.   Consent to Services:  The patient was given information about Chronic Care Management services, agreed to services, and gave verbal consent prior to initiation of services.  Please see initial visit note for detailed documentation.   Patient agreed to services and consent obtained.   Assessment: Review of patient past medical history, allergies, medications, and health status, including review of relevant consultants reports was performed today as part of a comprehensive evaluation and provision of chronic care management and care coordination services.     SDOH (Social Determinants of Health) assessments and interventions performed:  SDOH Interventions    Flowsheet Row Most Recent Value  SDOH Interventions   Depression Interventions/Treatment  Medication        Advanced Directives Status: Not addressed in this encounter.  CCM Care Plan  No Known Allergies  Outpatient Encounter Medications as of 05/04/2021  Medication Sig Note   acetaminophen (TYLENOL) 325 MG tablet Take 650 mg by mouth every 6 (six) hours as needed for mild pain.    albuterol (PROVENTIL) (2.5 MG/3ML) 0.083% nebulizer solution INHALE 1 VIAL VIA NEBULIZER EVERY 6 HOURS AS NEEDED FOR WHEEZING OR SHORTNESS OF BREATH    albuterol (VENTOLIN HFA) 108 (90 Base) MCG/ACT inhaler INHALE 2 PUFFS INTO THE LUNGS EVERY 6 (SIX) HOURS AS NEEDED FOR WHEEZING OR SHORTNESS OF  BREATH.    allopurinol (ZYLOPRIM) 300 MG tablet TAKE 1 TABLET EVERY DAY    ammonium lactate (AMLACTIN) 12 % lotion Apply 1 application topically as needed for dry skin. (Patient not taking: Reported on 04/26/2021)    atorvastatin (LIPITOR) 40 MG tablet TAKE 1 TABLET EVERY DAY    benzonatate (TESSALON) 100 MG capsule Take 1 capsule (100 mg total) by mouth 2 (two) times daily as needed for cough.    Blood Glucose Monitoring Suppl (TRUE METRIX METER) w/Device KIT USE AS DIRECTED    buPROPion (WELLBUTRIN XL) 300 MG 24 hr tablet Take 1 tablet (300 mg total) by mouth daily.    diclofenac Sodium (VOLTAREN) 1 % GEL Apply 2 g topically 4 (four) times daily.    docusate sodium (COLACE) 100 MG capsule Take 2 capsules (200 mg total) by mouth 2 (two) times daily. 02/21/2021: 2 once a day   empagliflozin (JARDIANCE) 10 MG TABS tablet Take 1 tablet (10 mg total) by mouth daily before breakfast. 03/03/2021: Gets from patient assistance ([employer])   FEROSUL 325 (65 Fe) MG tablet TAKE 1 TABLET EVERY DAY    fexofenadine (ALLEGRA) 180 MG tablet Take 180 mg by mouth daily.    gabapentin (NEURONTIN) 100 MG capsule TAKE 1 CAPSULE IN THE MORNING AND TAKE 2 CAPSULES IN THE EVENING    glipiZIDE (GLUCOTROL XL) 5 MG 24 hr tablet Take 1 tablet (5 mg total) by mouth daily with breakfast.    hydrOXYzine (ATARAX/VISTARIL) 25 MG tablet Take 1 tablet (25 mg total) by mouth every 8 (eight) hours as needed.    Krill Oil  350 MG CAPS Take 1 capsule by mouth in the morning and at bedtime.     lidocaine-prilocaine (EMLA) cream Apply 1 application topically as needed. (Patient not taking: Reported on 04/19/2021)    metFORMIN (GLUCOPHAGE) 850 MG tablet TAKE 1 TABLET TWICE DAILY WITH MEALS    metolazone (ZAROXOLYN) 5 MG tablet TAKE 1 TABLET (5 MG TOTAL) BY MOUTH THREE TIMES A WEEK. 04/19/2021: Using as needed of cardiology    metoprolol tartrate (LOPRESSOR) 100 MG tablet TAKE 1 TABLET TWICE DAILY    pantoprazole (PROTONIX) 40 MG tablet Take  1 tablet (40 mg total) by mouth daily.    polyethylene glycol (MIRALAX) 17 g packet Take 17 g by mouth daily as needed.    potassium chloride SA (KLOR-CON) 20 MEQ tablet TAKE 3 TABS DAILY AND TAKE AN ADDITIONAL 1 TAB ON THE DAY YOU TAKE YOUR WEEKLY METOLAZONE DOSE    rivaroxaban (XARELTO) 20 MG TABS tablet TAKE 1 Tablet BY MOUTH ONCE EVERY DAY WITH SUPPER    Semaglutide (RYBELSUS) 14 MG TABS Take 14 mg by mouth in the morning. 03/03/2021: Gets from patient assistance ([employer])   tamsulosin (FLOMAX) 0.4 MG CAPS capsule Take 1 capsule (0.4 mg total) by mouth daily.    terbinafine (LAMISIL) 250 MG tablet Take 1 tablet by mouth once daily (Patient not taking: Reported on 04/19/2021)    Tiotropium Bromide Monohydrate (SPIRIVA RESPIMAT) 2.5 MCG/ACT AERS Inhale 2 puffs into the lungs daily. 03/03/2021: Gets from patient assistance ([employer])   torsemide (DEMADEX) 20 MG tablet Take 2 tablets (40 mg total) by mouth 2 (two) times daily.    TRUE METRIX BLOOD GLUCOSE TEST test strip TEST ONE TIME DAILY FOR DIABETES    TRUEplus Lancets 33G MISC USE TO TEST ONE TIME DAILY FOR DIABETES    No facility-administered encounter medications on file as of 05/04/2021.    Patient Active Problem List   Diagnosis Date Noted   Pain due to onychomycosis of toenails of both feet 05/02/2021   Diabetic neuropathy ([place]) 05/02/2021   Callus of foot 05/02/2021   Upper respiratory infection 04/26/2021   Sore throat 04/26/2021   BPH with obstruction/lower urinary tract symptoms 04/05/2021   Acute on chronic diastolic CHF (congestive heart failure) ([place]) 03/25/2021   Morbid obesity with BMI of 60.0-69.9, adult ([place]) 03/25/2021   UTI (urinary tract infection) 03/24/2021   Lactic acidosis 03/24/2021   Leukocytosis 03/24/2021   Hyperglycemia due to diabetes mellitus ([place]) 03/24/2021   Mixed hyperlipidemia 03/24/2021   Hematuria 03/24/2021   Atrial fibrillation, chronic (HCC) 03/24/2021   Prolonged QT interval 03/24/2021    Iron deficiency anemia 02/21/2021   Constipation 02/21/2021   Rectal bleeding    Chronic blood loss anemia 11/12/2020   Gastrointestinal hemorrhage 10/08/2020   S/P placement of cardiac pacemaker 02/10/2020   Encounter for examination following treatment at hospital 02/10/2020   Anxiety 02/10/2020   DM2 (diabetes mellitus, type 2) ([place]) 02/10/2020   OSA on CPAP    Abnormal liver function 03/16/2019   History of pulmonary embolism 03/16/2019   Paroxysmal atrial fibrillation ([place]) 09/05/2018   COPD (chronic obstructive pulmonary disease) ([place]) 05/07/2018   Gastroesophageal reflux disease    Pulmonary embolism ([place]) 09/18/2017   Encounter for therapeutic drug monitoring 09/04/2017   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 08/23/2017   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   Pulmonary hypertension ([place])    Chronic periodontitis 07/18/2017   Morbid obesity ([place])  Essential hypertension    Tobacco abuse    Chronic heart failure with preserved ejection fraction (HFpEF) ([place])    Chronic venous insufficiency     Conditions to be addressed/monitored: Depression; Mental Health Concerns   Care Plan : LCSW PLAN OF CARE  Updates made by Deirdre Peer, LCSW since 05/04/2021 12:00 AM     Problem: Unmanaged depression   Priority: High     Long-Range Goal: Reduce symptoms/feelings related to depression Completed 05/04/2021  Start Date: 12/21/2020  Expected End Date: 05/20/2021  This Visit's Progress: On track  Recent Progress: On track  Priority: High  Note:   Current Barriers:  Chronic Mental Health needs related to depression Mental Health Concerns  Suicidal Ideation/Homicidal Ideation: No  Clinical Social Work Goal(s):  patient will work with SW  PRN  by telephone or in person to reduce or manage symptoms related to depression  patient will work with SW to address concerns related to depression and treatment  options patient will work with PCP and Pharmacist to address needs related to medication management/adjustment of current RX depression regimen to better treat symptoms patient will demonstrate improved health management independence as evidenced by reduction  of PHQ9 score  Interventions: 05/04/21- Pt is feeling better- "getting out more", made a new friend and enjoying activities (flea market,etc).  Pt does not wish to pursue counseling at this time as he feels his depression is better. CSW completed the PHQ9 with pt today- scoring "11"- down from "16". Denies SI/HI.  Depression screen [facility] 2/9 05/04/2021 03/14/2021 02/04/2021 01/31/2021 01/27/2021  Decreased Interest 2 2 0 0 0  Down, Depressed, Hopeless _0 0 0  PHQ - 2 Score _1 0 0  Altered sleeping _2 - -  Tired, decreased energy _3 - -  Change in appetite _4 - -  Feeling bad or failure about yourself  0 3 3 - -  Trouble concentrating _5 - -  Moving slowly or fidgety/restless _6 - -  Suicidal thoughts 0 0 0 - -  PHQ-9 Score _7 - -  Difficult doing work/chores Somewhat difficult Very difficult Very difficult - Not difficult at all  Some encounter information is confidential and restricted. Go to Review Flowsheets activity to see all data.  Some recent data might be hidden    Pt does admit to some restlessness and "leg will get to shaking". Instructed him on some deep breathing/relaxation techniques to try.  CSW will sign off- pt advised to reach out if further needs arise or ask PCP to re consult  11/14- Pt met with Maye Hides, LCSW, at [facility] for his initial visit- he shared with me he was billed over $150 for his portion and he cannot afford this- CSW assisted pt with calling [employer] who indicated his copay for outpatient mental health counseling is $57- CSW then assisted pt with calling Billing Dept and alerted them to this- per [place] with [facility], his insurance has not been filed yet. Pt  was advised not to pay the amount billed and to wait for filing and expect a $20 copay.  Pt is agreeable to seeing the counselor again- CSW will alert Maye Hides, LCSW, to this for follow up scheduling.   10/25- still seeking options for pt that are in-network with his insurance  03/07/21- Pt reports [facility] was only able to link him virtual counseling and he wants in personl.  CSW validated the in-person preference and will seek options for him.    02/04/21: Pt reports feeling better today. He is tired and feels his CPAP may be causing him to not sleep well. He plans to discuss further with provider.  CSW discussed his mental health with him- he is now willing to consider counseling and agrees to referral being made to [facility]. CSW advised pt to expect a call from [facility] to schedule him.   02/01/21: CSW received call from pt today who reports having a lot of side effects from medications (recently changed/adjusted). Per pt, "I am crying more and over nothing". He also shared that he called the 80 hotline for mental health crisis and was told the line was for people suicidal. Pt states, "I'm not suicidal and would not do that because I love myself too much... and my pets...." he also states that his "faith prevents him from doing that".  Pt has a lot of anxiety today around his medications and reports being told to do different things by the different providers. CSW advised pt he would need to get guidance from clinical staff (PCP, [place], RN) and CSW will alert the team of above.  CSW did remind pt to call CSW anytime and to alwaysnot hesitate to call 911 if emergency arises.    01/14/21: CSW spoke with pt who admits to history of depression. Pt denies any current or past SI/HI, hospitalizations. Pt is currently on Buspar and Celexa for his depression; his PHQ9 score today was 56- CSW discussed with pt the significance/severity of his score and discussed options. Pt declines counseling support. He also  declines referral to Psychiatry indicating he use to see "Dr Truman Hayward" who prescribed his meds but now his PCP does. Pt does not wish to be referred back to "Dr Truman Hayward" nor any other Psychiatrist. Pt also shares that the Buspar is 3 x daily- would like to consider other options that may be better coverage of his symptoms and needs; and less pills- "have to remember to take it".  CSW discussed HCPOA with pt; he does not have anyone appointed and states he does not have a relationship with his family. Encouraged pt to consider completion of Advance Directives- will mail pt packet to review.  Patient interviewed and appropriate assessments performed: PHQ 9 1:1 collaboration with Reginald Spar, MD regarding development and update of comprehensive plan of care as evidenced by provider attestation and co-signature Patient interviewed and appropriate assessments performed Provided mental health counseling with regard to depression; medications, counseling options  Discussed plans with patient for ongoing care management follow up and provided patient with direct contact information for care management team Provided education and assistance to client regarding Advanced Directives. Depression screen reviewed , PHQ2/ PHQ9 completed, Solution-Focused Strategies, Active listening / Reflection utilized , Psychoeducation for mental health needs , Reviewed mental health medications with patient and discussed compliance: discussed options for counseling and/or RX adjustments being made, Participation in counseling encouraged , Verbalization of feelings encouraged , Suicidal Ideation/Homicidal Ideation assessed:, Discussed [place] , Discussed referral to [facility] to assist with connecting to mental health provider, and pt declines counseling at this time  Patient Self Care Activities:  Self administers medications as prescribed Attends all scheduled provider appointments Performs ADL's  independently Performs IADL's independently Independent living  Patient Coping Strengths:  Hopefulness Self Advocate Able to Communicate Effectively  Patient Self Care Deficits:  Lacks social connections Family not involved/connected  Patient Goals:   -  call 911 for emergency or 988 for mental health support/crisis  -discuss medication adjustment/options with PCP and Pharmacist at PCP office - avoid negative self-talk - develop a personal safety plan - develop a plan to deal with triggers like holidays, anniversaries - have a plan for how to handle bad days - spend time or talk with others at least 2 to 3 times per week - spend time or talk with others every day - watch for early signs of feeling worse   Follow Up Plan: No further follow up planned.       Follow Up Plan: Client will reach out if further needs arise Eduard Clos MSW, LCSW Licensed Clinical Psychiatrist Primary Care [phone]

## 2021-05-05 ENCOUNTER — Other Ambulatory Visit: Payer: Self-pay | Admitting: Internal Medicine

## 2021-05-05 DIAGNOSIS — K219 Gastro-esophageal reflux disease without esophagitis: Secondary | ICD-10-CM

## 2021-05-09 ENCOUNTER — Encounter (INDEPENDENT_AMBULATORY_CARE_PROVIDER_SITE_OTHER): Payer: Self-pay | Admitting: *Deleted

## 2021-05-10 ENCOUNTER — Ambulatory Visit: Payer: Medicare HMO | Admitting: *Deleted

## 2021-05-10 DIAGNOSIS — J449 Chronic obstructive pulmonary disease, unspecified: Secondary | ICD-10-CM

## 2021-05-10 DIAGNOSIS — I5032 Chronic diastolic (congestive) heart failure: Secondary | ICD-10-CM

## 2021-05-10 NOTE — Patient Instructions (Signed)
Visit Information  Thank you for taking time to visit with me today. Please don't hesitate to contact me if I can be of assistance to you before our next scheduled telephone appointment.  Following are the goals we discussed today:  Heart Failure- - Important- call cardiologist office if I gain more than 3 pounds in one day or 5 pounds in one week- follow heart failure action plan, know how you are feeling each day with emphasis on yellow zone - elevate legs when you can - continue to weigh daily and write weight in diary- weigh same time each day on flat, hard surface - follow low salt diet- read labels, avoid salty snacks and gatorade - watch for swelling in feet, ankles and legs every day - take all medications as prescribed - keep in touch with your doctor about weight gain, swelling COPD- - Continue to follow COPD rescue plan, be mindful of how you feel each day with emphasis on yellow zone - continue follow rescue plan if symptoms flare-up, call doctor early on if not feeling well - keep follow-up appointments - see primary care doctor 12/23 - continue to use CPAP as prescribed- call DME company if having any problems with machine - take all medications and use inhalers as prescribed, talk to pharmacist assigned to your case if any questions or concerns - please continue to work with Education officer, museum for management of depression/ anxiety, continue to see counselor through [employer] - practice energy conservation, alternate activity with rest  - call RN care manager for any questions at [phone] - continue checking blood sugar daily - practice good handwashing and wear a mask as needed  Our next appointment is by telephone on 07/01/2021 at 215 pm  Please call the care guide team at [phone] if you need to cancel or reschedule your appointment.   If you are experiencing a Mental Health or [place] or need someone to talk to, please call the [place]  National Suicide Prevention Lifeline: 407-557-4082 or TTY: 4057509658 TTY 438-808-5349) to talk to a trained counselor call 1-800-273-TALK (toll free, 24 hour hotline) go to [facility] [address] [place] [phone]) call the [place]: [phone] call 911   Patient verbalizes understanding of instructions provided today and agrees to view in [place].   Jacqlyn Larsen [facility], BSN RN Case Manager [facility] [phone]

## 2021-05-10 NOTE — Chronic Care Management (AMB) (Signed)
Chronic Care Management   CCM RN Visit Note  05/10/2021 Name: Reginald [name] MRN: [id number] DOB: 1970/09/27  Subjective: Reginald [name] is a 50 y.o. year old male who is a primary care patient of [name], MD. The care management team was consulted for assistance with disease management and care coordination needs.    Engaged with patient by telephone for follow up visit in response to provider referral for case management and/or care coordination services.   Consent to Services:  The patient was given information about Chronic Care Management services, agreed to services, and gave verbal consent prior to initiation of services.  Please see initial visit note for detailed documentation.   Patient agreed to services and verbal consent obtained.   Assessment: Review of patient past medical history, allergies, medications, health status, including review of consultants reports, laboratory and other test data, was performed as part of comprehensive evaluation and provision of chronic care management services.   SDOH (Social Determinants of Health) assessments and interventions performed:    CCM Care Plan  No Known Allergies  Outpatient Encounter Medications as of 05/10/2021  Medication Sig Note   acetaminophen (TYLENOL) 325 MG tablet Take 650 mg by mouth every 6 (six) hours as needed for mild pain.    albuterol (PROVENTIL) (2.5 MG/3ML) 0.083% nebulizer solution INHALE 1 VIAL VIA NEBULIZER EVERY 6 HOURS AS NEEDED FOR WHEEZING OR SHORTNESS OF BREATH    albuterol (VENTOLIN HFA) 108 (90 Base) MCG/ACT inhaler INHALE 2 PUFFS INTO THE LUNGS EVERY 6 (SIX) HOURS AS NEEDED FOR WHEEZING OR SHORTNESS OF BREATH.    allopurinol (ZYLOPRIM) 300 MG tablet TAKE 1 TABLET EVERY DAY    atorvastatin (LIPITOR) 40 MG tablet TAKE 1 TABLET EVERY DAY    benzonatate (TESSALON) 100 MG capsule Take 1 capsule (100 mg total) by mouth 2 (two) times daily as needed for cough.    Blood Glucose Monitoring Suppl (TRUE  METRIX METER) w/Device KIT USE AS DIRECTED    buPROPion (WELLBUTRIN XL) 300 MG 24 hr tablet Take 1 tablet (300 mg total) by mouth daily.    diclofenac Sodium (VOLTAREN) 1 % GEL Apply 2 g topically 4 (four) times daily.    docusate sodium (COLACE) 100 MG capsule Take 2 capsules (200 mg total) by mouth 2 (two) times daily. 02/21/2021: 2 once a day   empagliflozin (JARDIANCE) 10 MG TABS tablet Take 1 tablet (10 mg total) by mouth daily before breakfast. 03/03/2021: Gets from patient assistance ([employer])   FEROSUL 325 (65 Fe) MG tablet TAKE 1 TABLET EVERY DAY    fexofenadine (ALLEGRA) 180 MG tablet Take 180 mg by mouth daily.    gabapentin (NEURONTIN) 100 MG capsule TAKE 1 CAPSULE IN THE MORNING AND TAKE 2 CAPSULES IN THE EVENING    glipiZIDE (GLUCOTROL XL) 5 MG 24 hr tablet Take 1 tablet (5 mg total) by mouth daily with breakfast.    hydrOXYzine (ATARAX/VISTARIL) 25 MG tablet Take 1 tablet (25 mg total) by mouth every 8 (eight) hours as needed.    Krill Oil 350 MG CAPS Take 1 capsule by mouth in the morning and at bedtime.     metFORMIN (GLUCOPHAGE) 850 MG tablet TAKE 1 TABLET TWICE DAILY WITH MEALS    metolazone (ZAROXOLYN) 5 MG tablet TAKE 1 TABLET (5 MG TOTAL) BY MOUTH THREE TIMES A WEEK. 04/19/2021: Using as needed of cardiology    metoprolol tartrate (LOPRESSOR) 100 MG tablet TAKE 1 TABLET TWICE DAILY    pantoprazole (PROTONIX)  40 MG tablet TAKE 1 TABLET EVERY DAY    polyethylene glycol (MIRALAX) 17 g packet Take 17 g by mouth daily as needed.    potassium chloride SA (KLOR-CON) 20 MEQ tablet TAKE 3 TABS DAILY AND TAKE AN ADDITIONAL 1 TAB ON THE DAY YOU TAKE YOUR WEEKLY METOLAZONE DOSE    rivaroxaban (XARELTO) 20 MG TABS tablet TAKE 1 Tablet BY MOUTH ONCE EVERY DAY WITH SUPPER    Semaglutide (RYBELSUS) 14 MG TABS Take 14 mg by mouth in the morning. 03/03/2021: Gets from patient assistance ([employer])   tamsulosin (FLOMAX) 0.4 MG CAPS capsule Take 1 capsule (0.4 mg total) by mouth daily.     Tiotropium Bromide Monohydrate (SPIRIVA RESPIMAT) 2.5 MCG/ACT AERS Inhale 2 puffs into the lungs daily. 03/03/2021: Gets from patient assistance ([employer])   torsemide (DEMADEX) 20 MG tablet Take 2 tablets (40 mg total) by mouth 2 (two) times daily.    TRUE METRIX BLOOD GLUCOSE TEST test strip TEST ONE TIME DAILY FOR DIABETES    TRUEplus Lancets 33G MISC USE TO TEST ONE TIME DAILY FOR DIABETES    ammonium lactate (AMLACTIN) 12 % lotion Apply 1 application topically as needed for dry skin. (Patient not taking: Reported on 04/26/2021)    lidocaine-prilocaine (EMLA) cream Apply 1 application topically as needed. (Patient not taking: Reported on 04/19/2021)    terbinafine (LAMISIL) 250 MG tablet Take 1 tablet by mouth once daily (Patient not taking: Reported on 04/19/2021)    No facility-administered encounter medications on file as of 05/10/2021.    Patient Active Problem List   Diagnosis Date Noted   Pain due to onychomycosis of toenails of both feet 05/02/2021   Diabetic neuropathy ([place]) 05/02/2021   Callus of foot 05/02/2021   Upper respiratory infection 04/26/2021   Sore throat 04/26/2021   BPH with obstruction/lower urinary tract symptoms 04/05/2021   Acute on chronic diastolic CHF (congestive heart failure) ([place]) 03/25/2021   Morbid obesity with BMI of 60.0-69.9, adult ([place]) 03/25/2021   UTI (urinary tract infection) 03/24/2021   Lactic acidosis 03/24/2021   Leukocytosis 03/24/2021   Hyperglycemia due to diabetes mellitus ([place]) 03/24/2021   Mixed hyperlipidemia 03/24/2021   Hematuria 03/24/2021   Atrial fibrillation, chronic (HCC) 03/24/2021   Prolonged QT interval 03/24/2021   Iron deficiency anemia 02/21/2021   Constipation 02/21/2021   Rectal bleeding    Chronic blood loss anemia 11/12/2020   Gastrointestinal hemorrhage 10/08/2020   S/P placement of cardiac pacemaker 02/10/2020   Encounter for examination following treatment at hospital 02/10/2020   Anxiety 02/10/2020   DM2  (diabetes mellitus, type 2) ([place]) 02/10/2020   OSA on CPAP    Abnormal liver function 03/16/2019   History of pulmonary embolism 03/16/2019   Paroxysmal atrial fibrillation ([place]) 09/05/2018   COPD (chronic obstructive pulmonary disease) ([place]) 05/07/2018   Gastroesophageal reflux disease    Pulmonary embolism ([place]) 09/18/2017   Encounter for therapeutic drug monitoring 09/04/2017   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 08/23/2017   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   Pulmonary hypertension ([place])    Chronic periodontitis 07/18/2017   Morbid obesity ([place])    Essential hypertension    Tobacco abuse    Chronic heart failure with preserved ejection fraction (HFpEF) ([place])    Chronic venous insufficiency     Conditions to be addressed/monitored:CHF and COPD  Care Plan : RN Care Manager plan of care  Updates made by Kassie Mends, RN since 05/10/2021  12:00 AM     Problem: No plan of care established for management of chronic disease states (CHF, COPD)   Priority: High     Long-Range Goal: Development of plan of care for chronic disease management (CHF, COPD)   Start Date: 04/05/2021  Expected End Date: 11/06/2021  Priority: High  Note:   Current Barriers:  Knowledge Deficits related to plan of care for management of CHF and COPD  Knowledge deficit related to basic heart failure pathophysiology and self care management- needs reinforcement for HF action plan, symptom management Does not adhere to provider recommendations re: low sodium diet, sometimes eats fast food, drinks gatorade although trying to cut down, does not exercise, feels he is not able, stays tired, pt reports he checks CBG once daily with most recent AIC 5.8, CBG ranges 120-150 per pt. Checks blood pressure on occasion, has had no further GI bleeding (pt states it was due to hemorrhoids) Patient does continue to weigh daily with weight today 481 pounds.   Patient reports he still has anxiety and is working with LCSW and has decided not to go back to counselor at [facility] , continues working with pharmacist for any medication related needs.  Pt reports he has all medications and taking as prescribed, reports had hematuria which has now resolved and continues following up with urologist Dr. Felipa Eth. Lacks social connections- pt lives alone, has a friend he can call if needed, does not communicate much with his family, gets out and drives to the grocery store and flea market to talk with friends Knowledge deficits related to basic understanding of COPD disease process- needs reinforcement of COPD action plan, symptom management, pt uses CPAP, checks 02 sat as needed and had CPAP machine serviced/recalibrated at [facility] in [place], no issues with CPAP at present. 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  RNCM Clinical Goal(s):  Patient will verbalize understanding of plan for management of CHF and COPD as evidenced by pt report, review EHR take all medications exactly as prescribed and will call provider for medication related questions as evidenced by pt report, review EHR    attend all scheduled medical appointments: 05/19/21 cystoscopy with urologist, 05/31/21 CCM pharmacist, 07/01/21 Dr. Elsworth Soho, 12/23 primary care provider  as evidenced by pt report, review EHR         and through collaboration with RN Care manager, provider, and care team.   Interventions: 1:1  collaboration with primary care provider regarding development and update of comprehensive plan of care as evidenced by provider attestation and co-signature Inter-disciplinary care team collaboration (see longitudinal plan of care) Evaluation of current treatment plan related to  self management and patient's adherence to plan as established by provider  COPD Interventions:   Advised patient to track and manage COPD triggers Advised patient to self assesses COPD action plan zone and make appointment with provider if in the yellow zone for 48 hours without improvement Provided education about and advised patient to utilize infection prevention strategies to reduce risk of respiratory infection Reinforced importance of good handwashing and wearing a mask  as needed Reinforced importance of wearing CPAP as prescribed and calling DME company for any issues Reviewed energy conservation- alternate activity with rest  Heart Failure Interventions: Discussed importance of daily weight and advised patient to weigh and record daily Reviewed role of diuretics in prevention of fluid overload and management of heart failure; Discussed the importance of keeping all appointments with provider  Encouraged low sodium diet Reviewed HF action plan and importance of calling doctor early on for change in health status, symptoms, emphasis on yellow zone Encouraged patient to continue getting outdoors daily and trying to stay active as possible Reviewed all upcoming scheduled appointments Pain assessment completed Reviewed CBG readings with patient Reviewed importance of continuing mental health counseling  Patient Goals/Self-Care Activities: Patient will self administer medications as prescribed as evidenced by self report/primary caregiver report  Patient will attend all scheduled provider appointments as evidenced by clinician review of documented attendance to scheduled appointments and patient/caregiver  report Patient will attend church or other social activities as evidenced by patient report Patient will continue to perform ADL's independently as evidenced by patient/caregiver report Patient will continue to perform IADL's independently as evidenced by patient/caregiver report Patient will call provider office for new concerns or questions as evidenced by review of documented incoming telephone call notes and patient report Heart Failure- - Important- call cardiologist office if I gain more than 3 pounds in one day or 5 pounds in one week- follow heart failure action plan, know how you are feeling each day with emphasis on yellow zone - elevate legs when you can - continue to weigh daily and write weight in diary- weigh same time each day on flat, hard surface - follow low salt diet- read labels, avoid salty snacks and gatorade - watch for swelling in feet, ankles and legs every day - take all medications as prescribed - keep in touch with your doctor about weight gain, swelling COPD- - Continue to follow COPD rescue plan, be mindful of how you feel each day with emphasis on yellow zone - continue follow rescue plan if symptoms flare-up, call doctor early on if not feeling well - keep follow-up appointments - see primary care doctor 12/23 - continue to use CPAP as prescribed- call DME company if having any problems with machine - take all medications and use inhalers as prescribed, talk to pharmacist assigned to your case if any questions or concerns - please continue to work with Education officer, museum for management of depression/ anxiety, continue to see counselor through [facility] - practice energy conservation, alternate activity with rest  - call RN care manager for any questions at [phone] - continue checking blood sugar daily - practice good handwashing and wear a mask as needed  Follow Up Plan:  Telephone follow up appointment with care management team member scheduled  for:  07/01/2021       Plan:Telephone follow up appointment with care management team member scheduled for:  07/01/2021  Jacqlyn Larsen [facility], BSN RN Case Manager [facility] [phone]

## 2021-05-13 ENCOUNTER — Other Ambulatory Visit: Payer: Self-pay

## 2021-05-13 ENCOUNTER — Ambulatory Visit (INDEPENDENT_AMBULATORY_CARE_PROVIDER_SITE_OTHER): Payer: Medicare HMO | Admitting: Internal Medicine

## 2021-05-13 ENCOUNTER — Encounter: Payer: Self-pay | Admitting: Internal Medicine

## 2021-05-13 VITALS — BP 162/88 | HR 94 | Resp 18 | Ht 71.0 in | Wt >= 6400 oz

## 2021-05-13 DIAGNOSIS — K922 Gastrointestinal hemorrhage, unspecified: Secondary | ICD-10-CM | POA: Diagnosis not present

## 2021-05-13 DIAGNOSIS — I10 Essential (primary) hypertension: Secondary | ICD-10-CM

## 2021-05-13 DIAGNOSIS — E559 Vitamin D deficiency, unspecified: Secondary | ICD-10-CM | POA: Diagnosis not present

## 2021-05-13 DIAGNOSIS — G4733 Obstructive sleep apnea (adult) (pediatric): Secondary | ICD-10-CM

## 2021-05-13 DIAGNOSIS — K625 Hemorrhage of anus and rectum: Secondary | ICD-10-CM | POA: Diagnosis not present

## 2021-05-13 DIAGNOSIS — D509 Iron deficiency anemia, unspecified: Secondary | ICD-10-CM | POA: Diagnosis not present

## 2021-05-13 DIAGNOSIS — Z9989 Dependence on other enabling machines and devices: Secondary | ICD-10-CM | POA: Diagnosis not present

## 2021-05-13 DIAGNOSIS — Z0001 Encounter for general adult medical examination with abnormal findings: Secondary | ICD-10-CM

## 2021-05-13 MED ORDER — LISINOPRIL 20 MG PO TABS: 20.0000 mg | ORAL_TABLET | Freq: Every day | ORAL | 3 refills | Status: DC

## 2021-05-13 NOTE — Patient Instructions (Signed)
Please start taking Lisinopril 20 mg once daily.  Continue taking other medications as prescribed.  Please continue to follow low carb and low salt diet and ambulate as tolerated.  Please use nasal saline spray and humidifier/vaporizer at home.

## 2021-05-13 NOTE — Progress Notes (Signed)
Established Patient Office Visit  Subjective:  Patient ID: Reginald [name], male    DOB: April 06, 1971  Age: 50 y.o. MRN: [id number]  CC:  Chief Complaint  Patient presents with   Annual Exam    Annual exam pt nose has been bleeding on and off for a few days    HPI Reginald [name] is a 50 y.o. male with past medical history of hypertension, aortic stenosis s/p aortic valve replacement and repair of ascending thoracic aortic aneurysm, paroxysmal atrial fibrillation, chronic CHF, h/o pacemaker (complete heart block), OSA on CPAP, COPD and depression who presents for annual physical.  HTN: His BP was elevated in the office today.  He states that his BP remains elevated at home as well.  Of note, his lisinopril has been held due to AKI about 2 months ago.  He is last BMP had shown improvement in kidney function.  He has been using CPAP regularly for OSA and his sleep has improved now.  He has mild, intermittent, generalized headache for last few weeks, which could be due to HTN.  He agrees to start taking lisinopril again.  Denies any chest pain or palpitations.  He denies any dysuria currently.  He is to get cystoscopy in the next week.  He has been taking Flomax for BPH.  He has seen Dr. Benjamine Mola for chronic nosebleeds.  He was given Flonase, but states that it has not been helping much.  He agrees to use nasal saline spray and humidifier at home to avoid dry air.    Past Medical History:  Diagnosis Date   Anxiety    Aortic stenosis    Arthritis    Asthma    Back pain    Cellulitis of skin with lymphangitis    CHF (congestive heart failure) (HCC)    Chronic diastolic congestive heart failure (HCC)    Chronic venous insufficiency    Constipation    COPD (chronic obstructive pulmonary disease) (HCC)    Depression    Diabetes ([place])    Dyspnea    Essential hypertension    Gout    Heart murmur    Hypertension    Morbid obesity ([place])    Obesity    Pneumonia    walking pneumonia    Prostatitis    Pulmonary embolism ([place])    Pulmonary hypertension (HCC)    Pyelonephritis    S/P aortic valve replacement with bioprosthetic valve 08/23/2017   25 mm Edwards Inspiris Resilia stented bovine pericardial tissue valve   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   24 mm Hemashield supracoronary straight graft    Sleep apnea    cpap    Thoracic ascending aortic aneurysm    Thoracic ascending aortic aneurysm    Tobacco abuse     Past Surgical History:  Procedure Laterality Date   AORTIC VALVE REPLACEMENT N/A 08/23/2017   Procedure: AORTIC VALVE REPLACEMENT (AVR) USING INSPIRIS RESILIA AORTIC VALVE SIZE 25 MM;  Surgeon: Rexene Alberts, MD;  Location: [place];  Service: Open Heart Surgery;  Laterality: N/A;   CARDIAC VALVE REPLACEMENT N/A    Phreesia 02/07/2020   COLONOSCOPY WITH PROPOFOL N/A 11/14/2020   Procedure: COLONOSCOPY WITH PROPOFOL;  Surgeon: Gatha Mayer, MD;  Location: [facility] ENDOSCOPY;  Service: Endoscopy;  Laterality: N/A;   ESOPHAGOGASTRODUODENOSCOPY (EGD) WITH PROPOFOL N/A 11/14/2020   Procedure: ESOPHAGOGASTRODUODENOSCOPY (EGD) WITH PROPOFOL;  Surgeon: Gatha Mayer, MD;  Location: [place];  Service: Endoscopy;  Laterality: N/A;  MULTIPLE EXTRACTIONS WITH ALVEOLOPLASTY N/A 07/23/2017   Procedure: Extraction of tooth #10 with alveoloplasty and gross debridement of remaining teeth;  Surgeon: Lenn Cal, DDS;  Location: [place];  Service: Oral Surgery;  Laterality: N/A;   PACEMAKER IMPLANT N/A 08/28/2017    St Jude Medical Assurity MRI conditional  dual-chamber pacemaker for symptomatic complete heart blockby Dr Rayann Heman   TEE WITHOUT CARDIOVERSION N/A 07/16/2017   Procedure: TRANSESOPHAGEAL ECHOCARDIOGRAM (TEE);  Surgeon: Lelon Perla, MD;  Location[facility] ENDOSCOPY;  Service: Cardiovascular;  Laterality: N/A;   TEE WITHOUT CARDIOVERSION N/A 08/23/2017   Procedure: TRANSESOPHAGEAL ECHOCARDIOGRAM (TEE);  Surgeon: Rexene Alberts, MD;  Location: [place];  Service: Open Heart Surgery;  Laterality: N/A;   THORACIC AORTIC ANEURYSM REPAIR N/A 08/23/2017   Procedure: THORACIC ASCENDING ANEURYSM REPAIR (AAA) USING HEMASHIELD GOLD KNITTED MICROVEL DOUBLE VELOUR VASCULAR GRAFT D: 24 MM  L: 30 CM;  Surgeon: Rexene Alberts, MD;  Location: [place];  Service: Open Heart Surgery;  Laterality: N/A;    Family History  Problem Relation Age of Onset   Hypertension Mother    Alzheimer's disease Mother    Heart attack Mother    Heart attack Brother    Diabetes Brother     Social History   Socioeconomic History   Marital status: Divorced    Spouse name: Not on file   Number of children: Not on file   Years of education: Not on file   Highest education level: Not on file  Occupational History   Not on file  Tobacco Use   Smoking status: Former    Packs/day: 2.00    Years: 16.00    Pack years: 32.00    Types: Cigarettes    Quit date: 08/23/2017    Years since quitting: 3.7   Smokeless tobacco: Never  Vaping Use   Vaping Use: Never used  Substance and Sexual Activity   Alcohol use: No    Comment: have had alcohol in the past, not heavy   Drug use: No   Sexual activity: Not Currently    Partners: Female  Other Topics Concern   Not on file  Social History Narrative   Not on file   Social Determinants of Health   Financial Resource Strain: Low Risk    Difficulty of Paying Living Expenses: Not hard at all  Food Insecurity: No Food Insecurity   Worried About Charity fundraiser in the Last Year: Never true   [place] in the Last Year: Never true  Transportation Needs: No Transportation Needs   Lack of Transportation (Medical): No   Lack of Transportation (Non-Medical): No  Physical Activity: Inactive   Days of Exercise per Week: 0 days   Minutes of Exercise per Session: 0 min  Stress: No Stress Concern Present   Feeling of Stress : Only a little  Social Connections: Socially Isolated   Frequency of Communication with  Friends and Family: Once a week   Frequency of Social Gatherings with Friends and Family: Never   Attends Religious Services: Never   Printmaker: No   Attends Music therapist: More than 4 times per year   Marital Status: Never married  Human resources officer Violence: Not At Risk   Fear of Current or Ex-Partner: No   Emotionally Abused: No   Physically Abused: No   Sexually Abused: No    Outpatient Medications Prior to Visit  Medication Sig Dispense Refill  acetaminophen (TYLENOL) 325 MG tablet Take 650 mg by mouth every 6 (six) hours as needed for mild pain.     albuterol (PROVENTIL) (2.5 MG/3ML) 0.083% nebulizer solution INHALE 1 VIAL VIA NEBULIZER EVERY 6 HOURS AS NEEDED FOR WHEEZING OR SHORTNESS OF BREATH 270 mL 1   albuterol (VENTOLIN HFA) 108 (90 Base) MCG/ACT inhaler INHALE 2 PUFFS INTO THE LUNGS EVERY 6 (SIX) HOURS AS NEEDED FOR WHEEZING OR SHORTNESS OF BREATH. 1 each 1   allopurinol (ZYLOPRIM) 300 MG tablet TAKE 1 TABLET EVERY DAY 90 tablet 0   ammonium lactate (AMLACTIN) 12 % lotion Apply 1 application topically as needed for dry skin. 400 g 0   atorvastatin (LIPITOR) 40 MG tablet TAKE 1 TABLET EVERY DAY 90 tablet 3   benzonatate (TESSALON) 100 MG capsule Take 1 capsule (100 mg total) by mouth 2 (two) times daily as needed for cough. 20 capsule 0   Blood Glucose Monitoring Suppl (TRUE METRIX METER) w/Device KIT USE AS DIRECTED 1 kit 0   buPROPion (WELLBUTRIN XL) 300 MG 24 hr tablet Take 1 tablet (300 mg total) by mouth daily. 90 tablet 3   diclofenac Sodium (VOLTAREN) 1 % GEL Apply 2 g topically 4 (four) times daily. 150 g 1   docusate sodium (COLACE) 100 MG capsule Take 2 capsules (200 mg total) by mouth 2 (two) times daily. 30 capsule 0   empagliflozin (JARDIANCE) 10 MG TABS tablet Take 1 tablet (10 mg total) by mouth daily before breakfast. 30 tablet 11   FEROSUL 325 (65 Fe) MG tablet TAKE 1 TABLET EVERY DAY 90 tablet 1   fexofenadine  (ALLEGRA) 180 MG tablet Take 180 mg by mouth daily.     gabapentin (NEURONTIN) 100 MG capsule TAKE 1 CAPSULE IN THE MORNING AND TAKE 2 CAPSULES IN THE EVENING 270 capsule 0   glipiZIDE (GLUCOTROL XL) 5 MG 24 hr tablet Take 1 tablet (5 mg total) by mouth daily with breakfast.     hydrOXYzine (ATARAX/VISTARIL) 25 MG tablet Take 1 tablet (25 mg total) by mouth every 8 (eight) hours as needed. 90 tablet 2   Krill Oil 350 MG CAPS Take 1 capsule by mouth in the morning and at bedtime.      lidocaine-prilocaine (EMLA) cream Apply 1 application topically as needed. 30 g 0   metFORMIN (GLUCOPHAGE) 850 MG tablet TAKE 1 TABLET TWICE DAILY WITH MEALS 180 tablet 1   metolazone (ZAROXOLYN) 5 MG tablet TAKE 1 TABLET (5 MG TOTAL) BY MOUTH THREE TIMES A WEEK. 36 tablet 3   metoprolol tartrate (LOPRESSOR) 100 MG tablet TAKE 1 TABLET TWICE DAILY 180 tablet 3   pantoprazole (PROTONIX) 40 MG tablet TAKE 1 TABLET EVERY DAY 90 tablet 3   polyethylene glycol (MIRALAX) 17 g packet Take 17 g by mouth daily as needed. 14 each 0   potassium chloride SA (KLOR-CON) 20 MEQ tablet TAKE 3 TABS DAILY AND TAKE AN ADDITIONAL 1 TAB ON THE DAY YOU TAKE YOUR WEEKLY METOLAZONE DOSE 283 tablet 3   rivaroxaban (XARELTO) 20 MG TABS tablet TAKE 1 Tablet BY MOUTH ONCE EVERY DAY WITH SUPPER 90 tablet 3   Semaglutide (RYBELSUS) 14 MG TABS Take 14 mg by mouth in the morning. 90 tablet 0   tamsulosin (FLOMAX) 0.4 MG CAPS capsule Take 1 capsule (0.4 mg total) by mouth daily. 30 capsule 11   terbinafine (LAMISIL) 250 MG tablet Take 1 tablet by mouth once daily 28 tablet 0   Tiotropium Bromide Monohydrate (SPIRIVA RESPIMAT) 2.5  MCG/ACT AERS Inhale 2 puffs into the lungs daily. 4 g 5   torsemide (DEMADEX) 20 MG tablet Take 2 tablets (40 mg total) by mouth 2 (two) times daily. 360 tablet 3   TRUE METRIX BLOOD GLUCOSE TEST test strip TEST ONE TIME DAILY FOR DIABETES 100 strip 5   TRUEplus Lancets 33G MISC USE TO TEST ONE TIME DAILY FOR DIABETES 100  each 5   No facility-administered medications prior to visit.    No Known Allergies  ROS Review of Systems  Constitutional:  Positive for fatigue. Negative for chills and fever.  HENT:  Positive for nosebleeds and sore throat. Negative for congestion, sinus pressure and sinus pain.   Eyes:  Negative for pain and redness.  Respiratory:  Positive for cough and shortness of breath.   Cardiovascular:  Positive for leg swelling. Negative for chest pain and palpitations.  Gastrointestinal:  Positive for constipation. Negative for diarrhea and vomiting.  Endocrine: Negative for polydipsia and polyuria.  Genitourinary:  Negative for dysuria and hematuria.  Musculoskeletal:  Positive for arthralgias and back pain. Negative for neck pain and neck stiffness.  Skin:  Positive for rash.  Neurological:  Positive for weakness and headaches. Negative for syncope.  Psychiatric/Behavioral:  Positive for dysphoric mood and sleep disturbance. Negative for agitation and behavioral problems. The patient is nervous/anxious.      Objective:    Physical Exam Vitals reviewed.  Constitutional:      General: He is not in acute distress.    Appearance: He is obese. He is not diaphoretic.     Comments: In wheelchair, uses cane at home  HENT:     Head: Normocephalic and atraumatic.     Nose: Nose normal.     Mouth/Throat:     Mouth: Mucous membranes are moist.  Eyes:     General: No scleral icterus.    Extraocular Movements: Extraocular movements intact.  Cardiovascular:     Rate and Rhythm: Normal rate and regular rhythm.     Pulses: Normal pulses.     Heart sounds: Murmur (Systolic over b/l upper sternal borders) heard.  Pulmonary:     Breath sounds: No wheezing or rales.  Abdominal:     Palpations: Abdomen is soft.     Tenderness: There is no abdominal tenderness.  Musculoskeletal:     Cervical back: Neck supple. No tenderness.     Right lower leg: Edema (2+) present.     Left lower leg: Edema  (2+) present.  Skin:    General: Skin is warm.     Findings: Rash (stasis dermatitis over b/l LE) present.  Neurological:     General: No focal deficit present.     Mental Status: He is alert and oriented to person, place, and time.     Sensory: No sensory deficit.     Motor: Weakness (4/5 in b/l LE) present.  Psychiatric:        Mood and Affect: Mood normal.        Behavior: Behavior normal.    BP (!) 162/88 (BP Location: Left Arm, Patient Position: Sitting, Cuff Size: Normal)    Pulse 94    Resp 18    Ht 5' 11" (1.803 m)    Wt (!) 487 lb 1.1 oz (220.9 kg)    SpO2 96%    BMI 67.93 kg/m  Wt Readings from Last 3 Encounters:  05/13/21 (!) 487 lb 1.1 oz (220.9 kg)  04/26/21 (!) 474 lb (215 kg)  04/19/21 Marland Kitchen)  486 lb 12.8 oz (220.8 kg)    Lab Results  Component Value Date   TSH 4.060 10/04/2020   Lab Results  Component Value Date   WBC 14.0 (H) 04/19/2021   HGB 12.5 (L) 04/19/2021   HCT 41.9 04/19/2021   MCV 85.0 04/19/2021   PLT 230 04/19/2021   Lab Results  Component Value Date   NA 141 04/19/2021   K 4.2 04/19/2021   CO2 31 04/19/2021   GLUCOSE 101 (H) 04/19/2021   BUN 28 (H) 04/19/2021   CREATININE 1.58 (H) 04/19/2021   BILITOT 1.9 (H) 03/25/2021   ALKPHOS 75 03/25/2021   AST 32 03/25/2021   ALT 20 03/25/2021   PROT 7.5 03/25/2021   ALBUMIN 3.2 (L) 03/25/2021   CALCIUM 9.1 04/19/2021   ANIONGAP 9 04/19/2021   EGFR 54 (L) 11/24/2020   Lab Results  Component Value Date   CHOL 97 03/25/2021   Lab Results  Component Value Date   HDL 27 (L) 03/25/2021   Lab Results  Component Value Date   LDLCALC 42 03/25/2021   Lab Results  Component Value Date   TRIG 138 03/25/2021   Lab Results  Component Value Date   CHOLHDL 3.6 03/25/2021   Lab Results  Component Value Date   HGBA1C 6.4 (H) 03/24/2021      Assessment & Plan:   Problem List Items Addressed This Visit       Cardiovascular and Mediastinum   Essential hypertension (Chronic)   Relevant  Medications   lisinopril (ZESTRIL) 20 MG tablet     Respiratory   OSA on CPAP     Other   Morbid obesity ([place])   Other Visit Diagnoses     Encounter for general adult medical examination with abnormal findings    -  Primary   Relevant Orders   TSH   CMP14+EGFR   CBC with Differential/Platelet   Vitamin D deficiency       Relevant Orders   VITAMIN D 25 Hydroxy (Vit-D Deficiency, Fractures)       Meds ordered this encounter  Medications   lisinopril (ZESTRIL) 20 MG tablet    Sig: Take 1 tablet (20 mg total) by mouth daily.    Dispense:  90 tablet    Refill:  3    Follow-up: Return in about 3 months (around 08/11/2021) for HTN and COPD.    [name], MD

## 2021-05-14 LAB — CBC WITH DIFFERENTIAL/PLATELET
Basophils Absolute: 0.1 10*3/uL (ref 0.0–0.2)
Basos: 0 %
EOS (ABSOLUTE): 0.4 10*3/uL (ref 0.0–0.4)
Eos: 4 %
Hematocrit: 41.3 % (ref 37.5–51.0)
Hemoglobin: 12.3 g/dL — ABNORMAL LOW (ref 13.0–17.7)
Immature Grans (Abs): 0.1 10*3/uL (ref 0.0–0.1)
Immature Granulocytes: 1 %
Lymphocytes Absolute: 2.1 10*3/uL (ref 0.7–3.1)
Lymphs: 19 %
MCH: 23.8 pg — ABNORMAL LOW (ref 26.6–33.0)
MCHC: 29.8 g/dL — ABNORMAL LOW (ref 31.5–35.7)
MCV: 80 fL (ref 79–97)
Monocytes Absolute: 0.6 10*3/uL (ref 0.1–0.9)
Monocytes: 6 %
Neutrophils Absolute: 7.9 10*3/uL — ABNORMAL HIGH (ref 1.4–7.0)
Neutrophils: 70 %
Platelets: 302 10*3/uL (ref 150–450)
RBC: 5.16 x10E6/uL (ref 4.14–5.80)
RDW: 18.2 % — ABNORMAL HIGH (ref 11.6–15.4)
WBC: 11.1 10*3/uL — ABNORMAL HIGH (ref 3.4–10.8)

## 2021-05-14 LAB — CMP14+EGFR
ALT: 20 IU/L (ref 0–44)
AST: 51 IU/L — ABNORMAL HIGH (ref 0–40)
Albumin/Globulin Ratio: 1 — ABNORMAL LOW (ref 1.2–2.2)
Albumin: 4 g/dL (ref 4.0–5.0)
Alkaline Phosphatase: 103 IU/L (ref 44–121)
BUN/Creatinine Ratio: 15 (ref 9–20)
BUN: 22 mg/dL (ref 6–24)
Bilirubin Total: 1.3 mg/dL — ABNORMAL HIGH (ref 0.0–1.2)
CO2: 34 mmol/L — ABNORMAL HIGH (ref 20–29)
Calcium: 9.5 mg/dL (ref 8.7–10.2)
Chloride: 86 mmol/L — ABNORMAL LOW (ref 96–106)
Creatinine, Ser: 1.43 mg/dL — ABNORMAL HIGH (ref 0.76–1.27)
Globulin, Total: 4.2 g/dL (ref 1.5–4.5)
Glucose: 234 mg/dL — ABNORMAL HIGH (ref 70–99)
Potassium: 3.6 mmol/L (ref 3.5–5.2)
Sodium: 137 mmol/L (ref 134–144)
Total Protein: 8.2 g/dL (ref 6.0–8.5)
eGFR: 60 mL/min/{1.73_m2} (ref >59)

## 2021-05-14 LAB — VITAMIN D 25 HYDROXY (VIT D DEFICIENCY, FRACTURES): Vit D, 25-Hydroxy: 24.8 ng/mL — ABNORMAL LOW (ref 30.0–100.0)

## 2021-05-14 LAB — TSH: TSH: 3.92 u[IU]/mL (ref 0.450–4.500)

## 2021-05-17 ENCOUNTER — Telehealth (INDEPENDENT_AMBULATORY_CARE_PROVIDER_SITE_OTHER): Payer: Self-pay

## 2021-05-17 NOTE — Telephone Encounter (Signed)
Patient aware he will not need to have his labs drawn for Dr. Jenetta Downer as Dr. Ihor Dow had drawn labs on him on 05/13/2021, and we had him add Fe,TIBC and Ferritin to those.

## 2021-05-17 NOTE — Telephone Encounter (Signed)
Reginald [name] given add on request

## 2021-05-17 NOTE — Telephone Encounter (Signed)
We have sent add on request to [facility]!  Thank you!

## 2021-05-17 NOTE — Telephone Encounter (Signed)
Thank you :)

## 2021-05-17 NOTE — Telephone Encounter (Signed)
Hello, Dr.Patel patient was supposed to have labs drawn for Dr. [name] this week (05/16/2021). We had ordered the labs and sent a letter to the patient to have these preformed. I noticed that you had labs drawn on the patient on 05/13/2021. We were wondering if you could possibly have your office staff add on a Fe,Tibc and Ferritin level to what has already been drawn? I realized that the lab holds on to the blood for seven days, and we can not add to your account with [facility] as we are not the ordering office. A Cbc was ordered by Dr. Jenetta Downer and I see that you already had this drawn. We were wondering if you could possibly add these tests on to prevent the patient from having to have his labs drawn again. Please advise. Thanks, Primary school teacher for [name], MD.

## 2021-05-18 DIAGNOSIS — J310 Chronic rhinitis: Secondary | ICD-10-CM | POA: Diagnosis not present

## 2021-05-18 DIAGNOSIS — J342 Deviated nasal septum: Secondary | ICD-10-CM | POA: Diagnosis not present

## 2021-05-18 DIAGNOSIS — J343 Hypertrophy of nasal turbinates: Secondary | ICD-10-CM | POA: Diagnosis not present

## 2021-05-18 LAB — SPECIMEN STATUS REPORT

## 2021-05-18 LAB — IRON: Iron: 70 ug/dL (ref 38–169)

## 2021-05-18 NOTE — Telephone Encounter (Signed)
FYI: You had ordered that the patient needed Fe,Tibc,Ferritin, cbc this week. I had Dr. Posey Pronto add on to his labs he had drawn on the patient from 05/13/2021 (to prevent patient from having to have more labs drawn). Patient aware that he will no longer have to have lab drawn separately for [place]. The Fe and cbc are back. Still pending TIBC, Ferritin.

## 2021-05-18 NOTE — Telephone Encounter (Signed)
Thanks, iron is normal. Will follow rest of results in next appointment with [facility]

## 2021-05-19 ENCOUNTER — Encounter: Payer: Self-pay | Admitting: Urology

## 2021-05-19 ENCOUNTER — Other Ambulatory Visit: Payer: Self-pay

## 2021-05-19 ENCOUNTER — Ambulatory Visit: Payer: Medicare HMO | Admitting: Urology

## 2021-05-19 VITALS — BP 150/82 | HR 77

## 2021-05-19 DIAGNOSIS — R310 Gross hematuria: Secondary | ICD-10-CM

## 2021-05-19 DIAGNOSIS — N401 Enlarged prostate with lower urinary tract symptoms: Secondary | ICD-10-CM

## 2021-05-19 DIAGNOSIS — N138 Other obstructive and reflux uropathy: Secondary | ICD-10-CM | POA: Diagnosis not present

## 2021-05-19 DIAGNOSIS — R8290 Unspecified abnormal findings in urine: Secondary | ICD-10-CM

## 2021-05-19 LAB — MICROSCOPIC EXAMINATION
RBC, Urine: >30 /hpf — AB (ref 0–2)
Renal Epithel, UA: NONE SEEN /hpf
WBC, UA: >30 /hpf — AB (ref 0–5)

## 2021-05-19 LAB — URINALYSIS, ROUTINE W REFLEX MICROSCOPIC
Bilirubin, UA: NEGATIVE
Ketones, UA: NEGATIVE
Nitrite, UA: NEGATIVE
Specific Gravity, UA: 1.015 (ref 1.005–1.030)
Urobilinogen, Ur: 0.2 mg/dL (ref 0.2–1.0)
pH, UA: 7.5 (ref 5.0–7.5)

## 2021-05-19 MED ORDER — CEFDINIR 300 MG PO CAPS: 300.0000 mg | ORAL_CAPSULE | Freq: Two times a day (BID) | ORAL | 0 refills | Status: AC

## 2021-05-19 NOTE — Progress Notes (Signed)
Assessment: 1. Gross hematuria   2. BPH with obstruction/lower urinary tract symptoms   3. Abnormal urine findings     Plan: Will postpone cystoscopy today due to concern for possible UTI given abnormalities on urinalysis and symptoms. Urine culture sent. Begin Omnicef 300 mg p.o. twice daily x7 days. Continue tamsulosin 0.4 mg daily.  Return to office in 1-2 weeks for cystoscopy I recommended that he contact his cardiologist to advise them of his increased shortness of breath.  Chief Complaint:  Chief Complaint  Patient presents with   Hematuria   History of Present Illness:  Reginald [name] is a 50 y.o. year old male who is seen for further evaluation of gross hematuria and LUTS.  He reports a recent history of gross hematuria, dysuria, and urinary hesitancy.  His symptoms began in early November 2022.  He presented to the emergency room on 03/24/2021 and was found to be hypotensive.  Urinalysis showed >50 RBCs.  Urine culture showed no growth.  CT abdomen and pelvis without contrast from 03/24/2021 showed no renal abnormalities, no stones or hydronephrosis, normal-appearing bladder.  He was treated with antibiotics for possible UTI.  Following discharge, the gross hematuria resolved.  He continued to report some slight dysuria and hesitancy.  He also has urinary frequency and nocturia baseline.  No prior history of stones or UTIs. AUA score = 18.  CT abdomen pelvis with contrast from 11/12/2020 showed no renal mass or obstruction. PSA 11/22:  1.9  He presents today for cystoscopy. He has had some recent increased shortness of breath.  He has increased the dose of his diuretic without significant improvement.  He noted onset of increased frequency, urgency, and dysuria this morning.  No gross hematuria.  No fevers, chills, or flank pain.  He continues on tamsulosin.  Portions of the above documentation were copied from a prior visit for review purposes only.   Past Medical History:   Past Medical History:  Diagnosis Date   Anxiety    Aortic stenosis    Arthritis    Asthma    Back pain    Cellulitis of skin with lymphangitis    CHF (congestive heart failure) (HCC)    Chronic diastolic congestive heart failure (HCC)    Chronic venous insufficiency    Constipation    COPD (chronic obstructive pulmonary disease) (HCC)    Depression    Diabetes ([place])    Dyspnea    Essential hypertension    Gout    Heart murmur    Hypertension    Morbid obesity ([place])    Obesity    Pneumonia    walking pneumonia   Prostatitis    Pulmonary embolism ([place])    Pulmonary hypertension (HCC)    Pyelonephritis    S/P aortic valve replacement with bioprosthetic valve 08/23/2017   25 mm Edwards Inspiris Resilia stented bovine pericardial tissue valve   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   24 mm Hemashield supracoronary straight graft    Sleep apnea    cpap    Thoracic ascending aortic aneurysm    Thoracic ascending aortic aneurysm    Tobacco abuse     Past Surgical History:  Past Surgical History:  Procedure Laterality Date   AORTIC VALVE REPLACEMENT N/A 08/23/2017   Procedure: AORTIC VALVE REPLACEMENT (AVR) USING INSPIRIS RESILIA AORTIC VALVE SIZE 25 MM;  Surgeon: Rexene Alberts, MD;  Location: [place];  Service: Open Heart Surgery;  Laterality: N/A;   CARDIAC VALVE REPLACEMENT N/A  Phreesia 02/07/2020   COLONOSCOPY WITH PROPOFOL N/A 11/14/2020   Procedure: COLONOSCOPY WITH PROPOFOL;  Surgeon: Gatha Mayer, MD;  Location[facility] ENDOSCOPY;  Service: Endoscopy;  Laterality: N/A;   ESOPHAGOGASTRODUODENOSCOPY (EGD) WITH PROPOFOL N/A 11/14/2020   Procedure: ESOPHAGOGASTRODUODENOSCOPY (EGD) WITH PROPOFOL;  Surgeon: Gatha Mayer, MD;  Location: [place];  Service: Endoscopy;  Laterality: N/A;   MULTIPLE EXTRACTIONS WITH ALVEOLOPLASTY N/A 07/23/2017   Procedure: Extraction of tooth #10 with alveoloplasty and gross debridement of remaining teeth;  Surgeon: Lenn Cal, DDS;  Location: [place];  Service: Oral Surgery;  Laterality: N/A;   PACEMAKER IMPLANT N/A 08/28/2017    St Jude Medical Assurity MRI conditional  dual-chamber pacemaker for symptomatic complete heart blockby Dr Rayann Heman   TEE WITHOUT CARDIOVERSION N/A 07/16/2017   Procedure: TRANSESOPHAGEAL ECHOCARDIOGRAM (TEE);  Surgeon: Lelon Perla, MD;  Location: [facility] ENDOSCOPY;  Service: Cardiovascular;  Laterality: N/A;   TEE WITHOUT CARDIOVERSION N/A 08/23/2017   Procedure: TRANSESOPHAGEAL ECHOCARDIOGRAM (TEE);  Surgeon: Rexene Alberts, MD;  Location: [place];  Service: Open Heart Surgery;  Laterality: N/A;   THORACIC AORTIC ANEURYSM REPAIR N/A 08/23/2017   Procedure: THORACIC ASCENDING ANEURYSM REPAIR (AAA) USING HEMASHIELD GOLD KNITTED MICROVEL DOUBLE VELOUR VASCULAR GRAFT D: 24 MM  L: 30 CM;  Surgeon: Rexene Alberts, MD;  Location: [place];  Service: Open Heart Surgery;  Laterality: N/A;    Allergies:  No Known Allergies  Family History:  Family History  Problem Relation Age of Onset   Hypertension Mother    Alzheimer's disease Mother    Heart attack Mother    Heart attack Brother    Diabetes Brother     Social History:  Social History   Tobacco Use   Smoking status: Former    Packs/day: 2.00    Years: 16.00    Pack years: 32.00    Types: Cigarettes    Quit date: 08/23/2017    Years since quitting: 3.7   Smokeless tobacco: Never  Vaping Use   Vaping Use: Never used  Substance Use Topics   Alcohol use: No    Comment: have had alcohol in the past, not heavy   Drug use: No   ROS: Constitutional:  Negative for fever, chills, weight loss CV: Negative for chest pain, previous MI, hypertension Respiratory:  Negative for shortness of breath, wheezing, sleep apnea, frequent cough GI:  Negative for nausea, vomiting, bloody stool, GERD  Physical exam: BP (!) 150/82    Pulse 77  GENERAL APPEARANCE:  Well appearing, well developed, well nourished, NAD HEENT:  Atraumatic,  normocephalic, oropharynx clear NECK:  Supple without lymphadenopathy or thyromegaly ABDOMEN:  Soft, non-tender, no masses EXTREMITIES:  Moves all extremities well, without clubbing, cyanosis, or edema NEUROLOGIC:  Alert and oriented x 3, normal gait, CN II-XII grossly intact MENTAL STATUS:  appropriate BACK:  Non-tender to palpation, No CVAT SKIN:  Warm, dry, and intact   Results: U/A:  >30 WBC, >30 RBC, few bacteria

## 2021-05-21 DIAGNOSIS — I5032 Chronic diastolic (congestive) heart failure: Secondary | ICD-10-CM | POA: Diagnosis not present

## 2021-05-21 DIAGNOSIS — E1169 Type 2 diabetes mellitus with other specified complication: Secondary | ICD-10-CM

## 2021-05-21 DIAGNOSIS — J449 Chronic obstructive pulmonary disease, unspecified: Secondary | ICD-10-CM | POA: Diagnosis not present

## 2021-05-24 ENCOUNTER — Ambulatory Visit (INDEPENDENT_AMBULATORY_CARE_PROVIDER_SITE_OTHER): Payer: Medicare HMO | Admitting: Internal Medicine

## 2021-05-24 ENCOUNTER — Telehealth (HOSPITAL_COMMUNITY): Payer: Self-pay | Admitting: Clinical

## 2021-05-24 ENCOUNTER — Other Ambulatory Visit: Payer: Self-pay

## 2021-05-24 ENCOUNTER — Encounter: Payer: Self-pay | Admitting: Internal Medicine

## 2021-05-24 DIAGNOSIS — U071 COVID-19: Secondary | ICD-10-CM

## 2021-05-24 MED ORDER — DEXAMETHASONE 6 MG PO TABS: 6.0000 mg | ORAL_TABLET | Freq: Every day | ORAL | 0 refills | Status: DC

## 2021-05-24 MED ORDER — MOLNUPIRAVIR EUA 200MG CAPSULE: 4.0000 | ORAL_CAPSULE | Freq: Two times a day (BID) | ORAL | 0 refills | Status: AC

## 2021-05-24 NOTE — Telephone Encounter (Signed)
Called to schedule f/u appt per note from pcp to therapist and then routed to this admin advised he did not want to schedule any future appts. confirmed with patient and advised provider.

## 2021-05-24 NOTE — Telephone Encounter (Signed)
Dr patel called pt

## 2021-05-24 NOTE — Progress Notes (Signed)
Virtual Visit via Telephone Note   This visit type was conducted due to national recommendations for restrictions regarding the COVID-19 Pandemic (e.g. social distancing) in an effort to limit this patient's exposure and mitigate transmission in our community.  Due to his co-morbid illnesses, this patient is at least at moderate risk for complications without adequate follow up.  This format is felt to be most appropriate for this patient at this time.  The patient did not have access to video technology/had technical difficulties with video requiring transitioning to audio format only (telephone).  All issues noted in this document were discussed and addressed.  No physical exam could be performed with this format.  Evaluation Performed:  Follow-up visit  Date:  05/24/2021   ID:  COBE VINEY, DOB 02/13/1971, MRN [id number]  Patient Location: Home Provider Location: Office/Clinic  Participants: Patient Location of Patient: Home Location of Provider: Telehealth Consent was obtain for visit to be over via telehealth. I verified that I am speaking with the correct person using two identifiers.  PCP:  [name], MD   Chief Complaint: Cough, fever and wheezing  History of Present Illness:    Reginald [name] is a 51 y.o. male who has a televisit for complaint of fever, cough, dyspnea with wheezing and nasal congestion for last 4 days.  He tested positive for COVID at home yesterday.  He has had COVID vaccines.  He has not tried any symptomatic treatment yet except his regular albuterol inhaler for COPD.  The patient does have symptoms concerning for COVID-19 infection (fever, chills, cough, or new shortness of breath).   Past Medical, Surgical, Social History, Allergies, and Medications have been Reviewed.  Past Medical History:  Diagnosis Date   Anxiety    Aortic stenosis    Arthritis    Asthma    Back pain    Cellulitis of skin with lymphangitis    CHF (congestive heart  failure) (HCC)    Chronic diastolic congestive heart failure (HCC)    Chronic venous insufficiency    Constipation    COPD (chronic obstructive pulmonary disease) (HCC)    Depression    Diabetes ([place])    Dyspnea    Essential hypertension    Gout    Heart murmur    Hypertension    Morbid obesity ([place])    Obesity    Pneumonia    walking pneumonia   Prostatitis    Pulmonary embolism ([place])    Pulmonary hypertension (HCC)    Pyelonephritis    S/P aortic valve replacement with bioprosthetic valve 08/23/2017   25 mm Edwards Inspiris Resilia stented bovine pericardial tissue valve   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   24 mm Hemashield supracoronary straight graft    Sleep apnea    cpap    Thoracic ascending aortic aneurysm    Thoracic ascending aortic aneurysm    Tobacco abuse    Past Surgical History:  Procedure Laterality Date   AORTIC VALVE REPLACEMENT N/A 08/23/2017   Procedure: AORTIC VALVE REPLACEMENT (AVR) USING INSPIRIS RESILIA AORTIC VALVE SIZE 25 MM;  Surgeon: Rexene Alberts, MD;  Location: [place];  Service: Open Heart Surgery;  Laterality: N/A;   CARDIAC VALVE REPLACEMENT N/A    [employer]reesia 02/07/2020   COLONOSCOPY WITH PROPOFOL N/A 11/14/2020   Procedure: COLONOSCOPY WITH PROPOFOL;  Surgeon: Gatha Mayer, MD;  Location: [facility] ENDOSCOPY;  Service: Endoscopy;  Laterality: N/A;   ESOPHAGOGASTRODUODENOSCOPY (EGD) WITH PROPOFOL N/A 11/14/2020  Procedure: ESOPHAGOGASTRODUODENOSCOPY (EGD) WITH PROPOFOL;  Surgeon: Gatha Mayer, MD;  Location[facility] ENDOSCOPY;  Service: Endoscopy;  Laterality: N/A;   MULTIPLE EXTRACTIONS WITH ALVEOLOPLASTY N/A 07/23/2017   Procedure: Extraction of tooth #10 with alveoloplasty and gross debridement of remaining teeth;  Surgeon: Lenn Cal, DDS;  Location: [place];  Service: Oral Surgery;  Laterality: N/A;   PACEMAKER IMPLANT N/A 08/28/2017    [employer] Jude [employer]urity MRI conditional  dual-chamber pacemaker for symptomatic complete  heart blockby Dr Rayann Heman   TEE WITHOUT CARDIOVERSION N/A 07/16/2017   Procedure: TRANSESOPHAGEAL ECHOCARDIOGRAM (TEE);  Surgeon: Lelon Perla, MD;  Location[facility] ENDOSCOPY;  Service: Cardiovascular;  Laterality: N/A;   TEE WITHOUT CARDIOVERSION N/A 08/23/2017   Procedure: TRANSESOPHAGEAL ECHOCARDIOGRAM (TEE);  Surgeon: Rexene Alberts, MD;  Location: [place];  Service: Open Heart Surgery;  Laterality: N/A;   THORACIC AORTIC ANEURYSM REPAIR N/A 08/23/2017   Procedure: THORACIC ASCENDING ANEURYSM REPAIR (AAA) USING HEMASHIELD GOLD KNITTED MICROVEL DOUBLE VELOUR VASCULAR GRAFT D: 24 MM  L: 30 CM;  Surgeon: Rexene Alberts, MD;  Location: [place];  Service: Open Heart Surgery;  Laterality: N/A;     Current Meds  Medication Sig   acetaminophen (TYLENOL) 325 MG tablet Take 650 mg by mouth every 6 (six) hours as needed for mild pain.   albuterol (PROVENTIL) (2.5 MG/3ML) 0.083% nebulizer solution INHALE 1 VIAL VIA NEBULIZER EVERY 6 HOURS AS NEEDED FOR WHEEZING OR SHORTNESS OF BREATH   albuterol (VENTOLIN HFA) 108 (90 Base) MCG/ACT inhaler INHALE 2 PUFFS INTO THE LUNGS EVERY 6 (SIX) HOURS AS NEEDED FOR WHEEZING OR SHORTNESS OF BREATH.   allopurinol (ZYLOPRIM) 300 MG tablet TAKE 1 TABLET EVERY DAY   ammonium lactate (AMLACTIN) 12 % lotion Apply 1 application topically as needed for dry skin.   atorvastatin (LIPITOR) 40 MG tablet TAKE 1 TABLET EVERY DAY   benzonatate (TESSALON) 100 MG capsule Take 1 capsule (100 mg total) by mouth 2 (two) times daily as needed for cough.   Blood Glucose Monitoring Suppl (TRUE METRIX METER) w/Device KIT USE AS DIRECTED   buPROPion (WELLBUTRIN XL) 300 MG 24 hr tablet Take 1 tablet (300 mg total) by mouth daily.   cefdinir (OMNICEF) 300 MG capsule Take 1 capsule (300 mg total) by mouth 2 (two) times daily for 7 days.   diclofenac Sodium (VOLTAREN) 1 % GEL Apply 2 g topically 4 (four) times daily.   docusate sodium (COLACE) 100 MG capsule Take 2 capsules (200 mg total) by  mouth 2 (two) times daily.   empagliflozin (JARDIANCE) 10 MG TABS tablet Take 1 tablet (10 mg total) by mouth daily before breakfast.   FEROSUL 325 (65 Fe) MG tablet TAKE 1 TABLET EVERY DAY   fexofenadine (ALLEGRA) 180 MG tablet Take 180 mg by mouth daily.   gabapentin (NEURONTIN) 100 MG capsule TAKE 1 CAPSULE IN THE MORNING AND TAKE 2 CAPSULES IN THE EVENING   glipiZIDE (GLUCOTROL XL) 5 MG 24 hr tablet Take 1 tablet (5 mg total) by mouth daily with breakfast.   hydrOXYzine (ATARAX/VISTARIL) 25 MG tablet Take 1 tablet (25 mg total) by mouth every 8 (eight) hours as needed.   Krill Oil 350 MG CAPS Take 1 capsule by mouth in the morning and at bedtime.    lidocaine-prilocaine (EMLA) cream Apply 1 application topically as needed.   lisinopril (ZESTRIL) 20 MG tablet Take 1 tablet (20 mg total) by mouth daily.   metFORMIN (GLUCOPHAGE) 850 MG tablet TAKE 1 TABLET TWICE  DAILY WITH MEALS   metolazone (ZAROXOLYN) 5 MG tablet TAKE 1 TABLET (5 MG TOTAL) BY MOUTH THREE TIMES A WEEK.   metoprolol tartrate (LOPRESSOR) 100 MG tablet TAKE 1 TABLET TWICE DAILY   pantoprazole (PROTONIX) 40 MG tablet TAKE 1 TABLET EVERY DAY   polyethylene glycol (MIRALAX) 17 g packet Take 17 g by mouth daily as needed.   potassium chloride SA (KLOR-CON) 20 MEQ tablet TAKE 3 TABS DAILY AND TAKE AN ADDITIONAL 1 TAB ON THE DAY YOU TAKE YOUR WEEKLY METOLAZONE DOSE   rivaroxaban (XARELTO) 20 MG TABS tablet TAKE 1 Tablet BY MOUTH ONCE EVERY DAY WITH SUPPER   Semaglutide (RYBELSUS) 14 MG TABS Take 14 mg by mouth in the morning.   tamsulosin (FLOMAX) 0.4 MG CAPS capsule Take 1 capsule (0.4 mg total) by mouth daily.   terbinafine (LAMISIL) 250 MG tablet Take 1 tablet by mouth once daily   Tiotropium Bromide Monohydrate (SPIRIVA RESPIMAT) 2.5 MCG/ACT AERS Inhale 2 puffs into the lungs daily.   torsemide (DEMADEX) 20 MG tablet Take 2 tablets (40 mg total) by mouth 2 (two) times daily.   TRUE METRIX BLOOD GLUCOSE TEST test strip TEST ONE  TIME DAILY FOR DIABETES   TRUEplus Lancets 33G MISC USE TO TEST ONE TIME DAILY FOR DIABETES     Allergies:   Patient has no known allergies.   ROS:   Please see the history of present illness.     All other systems reviewed and are negative.   Labs/Other Tests and Data Reviewed:    Recent Labs: 01/30/2021: B Natriuretic Peptide 67.0 03/25/2021: Magnesium 2.0 05/13/2021: ALT 20; BUN 22; Creatinine, Ser 1.43; Hemoglobin 12.3; Platelets 302; Potassium 3.6; Sodium 137; TSH 3.920   Recent Lipid Panel Lab Results  Component Value Date/Time   CHOL 97 03/25/2021 05:39 AM   CHOL 123 06/10/2020 08:37 AM   TRIG 138 03/25/2021 05:39 AM   HDL 27 (L) 03/25/2021 05:39 AM   HDL 25 (L) 06/10/2020 08:37 AM   CHOLHDL 3.6 03/25/2021 05:39 AM   LDLCALC 42 03/25/2021 05:39 AM   LDLCALC 54 06/10/2020 08:37 AM    Wt Readings from Last 3 Encounters:  05/13/21 (!) 487 lb 1.1 oz (220.9 kg)  04/26/21 (!) 474 lb (215 kg)  04/19/21 (!) 486 lb 12.8 oz (220.8 kg)     ASSESSMENT & PLAN:    COVID-19 infection Unable to prescribe Paxlovid due to concurrent use of Xarelto for A. fib Started Molnupiravir Dexamethasone 6 mg QD X 10 days due to dyspnea Continue albuterol as needed for dyspnea or wheezing Mucinex as needed for cough  Time:   Today, I have spent 15 minutes reviewing the chart, including problem list, medications, and with the patient with telehealth technology discussing the above problems.   Medication Adjustments/Labs and Tests Ordered: Current medicines are reviewed at length with the patient today.  Concerns regarding medicines are outlined above.   Tests Ordered: No orders of the defined types were placed in this encounter.   Medication Changes: No orders of the defined types were placed in this encounter.    Note: This dictation was prepared with Dragon dictation along with smaller phrase technology. Similar sounding words can be transcribed inadequately or may not be  corrected upon review. Any transcriptional errors that result from this process are unintentional.      Disposition:  Follow up  Signed, [name], MD  05/24/2021 10:44 AM     [place]

## 2021-05-25 ENCOUNTER — Ambulatory Visit (INDEPENDENT_AMBULATORY_CARE_PROVIDER_SITE_OTHER): Payer: Medicare HMO

## 2021-05-25 DIAGNOSIS — I442 Atrioventricular block, complete: Secondary | ICD-10-CM | POA: Diagnosis not present

## 2021-05-25 LAB — CUP PACEART REMOTE DEVICE CHECK
Battery Remaining Longevity: 89 mo
Battery Remaining Percentage: 72 %
Battery Voltage: 3.01 V
Brady Statistic AP VP Percent: 1 %
Brady Statistic AP VS Percent: 1 %
Brady Statistic AS VP Percent: 1 %
Brady Statistic AS VS Percent: 99 %
Brady Statistic RA Percent Paced: 1 %
Brady Statistic RV Percent Paced: 1 %
Date Time Interrogation Session: 20230104020023
Implantable Lead Implant Date: 20190409
Implantable Lead Implant Date: 20190409
Implantable Lead Location: 753859
Implantable Lead Location: 753860
Implantable Pulse Generator Implant Date: 20190409
Lead Channel Impedance Value: 540 Ohm
Lead Channel Impedance Value: 610 Ohm
Lead Channel Pacing Threshold Amplitude: 0.5 V
Lead Channel Pacing Threshold Amplitude: 0.75 V
Lead Channel Pacing Threshold Pulse Width: 0.5 ms
Lead Channel Pacing Threshold Pulse Width: 0.5 ms
Lead Channel Sensing Intrinsic Amplitude: 5 mV
Lead Channel Sensing Intrinsic Amplitude: 5.6 mV
Lead Channel Setting Pacing Amplitude: 2 V
Lead Channel Setting Pacing Amplitude: 2.5 V
Lead Channel Setting Pacing Pulse Width: 0.5 ms
Lead Channel Setting Sensing Sensitivity: 1.5 mV
Pulse Gen Model: 2272
Pulse Gen Serial Number: 9010865

## 2021-05-31 ENCOUNTER — Other Ambulatory Visit: Payer: Self-pay | Admitting: Otolaryngology

## 2021-05-31 ENCOUNTER — Ambulatory Visit: Payer: Medicare HMO

## 2021-05-31 ENCOUNTER — Other Ambulatory Visit: Payer: Self-pay | Admitting: Student

## 2021-05-31 ENCOUNTER — Ambulatory Visit (INDEPENDENT_AMBULATORY_CARE_PROVIDER_SITE_OTHER): Payer: Medicare HMO | Admitting: Pharmacist

## 2021-05-31 DIAGNOSIS — J449 Chronic obstructive pulmonary disease, unspecified: Secondary | ICD-10-CM

## 2021-05-31 DIAGNOSIS — I10 Essential (primary) hypertension: Secondary | ICD-10-CM

## 2021-05-31 DIAGNOSIS — F419 Anxiety disorder, unspecified: Secondary | ICD-10-CM

## 2021-05-31 DIAGNOSIS — I480 Paroxysmal atrial fibrillation: Secondary | ICD-10-CM

## 2021-05-31 DIAGNOSIS — F339 Major depressive disorder, recurrent, unspecified: Secondary | ICD-10-CM

## 2021-05-31 DIAGNOSIS — I5032 Chronic diastolic (congestive) heart failure: Secondary | ICD-10-CM

## 2021-05-31 DIAGNOSIS — E1169 Type 2 diabetes mellitus with other specified complication: Secondary | ICD-10-CM

## 2021-05-31 NOTE — Chronic Care Management (AMB) (Signed)
Chronic Care Management Pharmacy Note  05/31/2021 Name:  Reginald [name] MRN:  [id number] DOB:  18-Aug-1970  Summary: General: Patient reports feeling mostly better from Covid infection. Still having some shortness of breath. Instructed patient to make sure he is utilizing his inhalers and nebulizer as needed and if severe shortness of breath to go to ER. Patient verbalized understanding.   Type-2 diabetes Jardiance currently on hold per cardiology due to urinary tract infection  Patient is approved for Rybelsus through [employer] patient assistance program through 04/20/22 and Jardiance through [place] patient assistance program through 05/21/22 Current glucose readings over last week: fasting blood glucose mostly 139-174 (outlier 236); elevated blood glucose may be related to current dexamethasone use for covid infection Continue metformin 850 mg by mouth twice daily, glipizide XL 5 mg by mouth with breakfast, and semaglutide (Rybelsus) 14 mg by mouth at least 30 minutes before the first food, beverage, or other oral medications of the day with no more than 4 ounces of plain water only Remain off Jardiance per cardiology. May consider restarting at some point  Hypertension Lisinopril recently restarted per PCP since serum creatinine improved back to baseline  Heart failure with preserved ejection fraction (LVEF >50% with evidence of spontaneous or provokable increased LV filling pressures) Lisinopril recently restarted per PCP since serum creatinine improved back to baseline. Jardiance currently on hold per cardiology due to urinary tract infection.  Current home weights: mostly stable 470-480s Remain off Jardiance per cardiology Continue beta blocker, ACE inhibitor, and diuretics  Subjective: Reginald [name] is an 51 y.o. year old male who is a primary patient of [name], MD.  The CCM team was consulted for assistance with disease management and care coordination needs.     Engaged with patient by telephone for follow up visit in response to provider referral for pharmacy case management and/or care coordination services.   Consent to Services:  The patient was given information about Chronic Care Management services, agreed to services, and gave verbal consent prior to initiation of services.  Please see initial visit note for detailed documentation.   Patient Care Team: [name], MD as PCP - General (Internal Medicine) Thompson Grayer, MD as PCP - Electrophysiology (Cardiology) Josue Hector, MD as PCP - Cardiology (Cardiology) Kassie Mends, RN as [place] Management [place], Ranae Pila, LCSW as Social Worker (Licensed Clinical Social Worker) Beryle Lathe, [facility] (Pharmacist)  Objective:  Lab Results  Component Value Date   CREATININE 1.43 (H) 05/13/2021   CREATININE 1.58 (H) 04/19/2021   CREATININE 2.17 (H) 03/25/2021    Lab Results  Component Value Date   HGBA1C 6.4 (H) 03/24/2021   Last diabetic Eye exam: No results found for: HMDIABEYEEXA  Last diabetic Foot exam: No results found for: HMDIABFOOTEX      Component Value Date/Time   CHOL 97 03/25/2021 0539   CHOL 123 06/10/2020 0837   TRIG 138 03/25/2021 0539   HDL 27 (L) 03/25/2021 0539   HDL 25 (L) 06/10/2020 0837   CHOLHDL 3.6 03/25/2021 0539   VLDL 28 03/25/2021 0539   LDLCALC 42 03/25/2021 0539   LDLCALC 54 06/10/2020 0837    Hepatic Function Latest Ref Rng & Units 05/13/2021 03/25/2021 11/24/2020  Total Protein 6.0 - 8.5 g/dL 8.2 7.5 7.9  Albumin 4.0 - 5.0 g/dL 4.0 3.2(L) 4.3  AST 0 - 40 IU/L 51(H) 32 41(H)  ALT 0 - 44 IU/L _0 Alk  Phosphatase 44 - 121 IU/L 103 75 98  Total Bilirubin 0.0 - 1.2 mg/dL 1.3(H) 1.9(H) 0.7  Bilirubin, Direct 0.0 - 0.2 mg/dL - - -    Lab Results  Component Value Date/Time   TSH 3.920 05/13/2021 10:01 AM   TSH 4.060 10/04/2020 09:43 AM   FREET4 1.30 10/04/2020 09:43 AM   FREET4 0.79 (L) 05/03/2018  02:04 PM    CBC Latest Ref Rng & Units 05/13/2021 04/19/2021 03/25/2021  WBC 3.4 - 10.8 x10E3/uL 11.1(H) 14.0(H) 20.6(H)  Hemoglobin 13.0 - 17.7 g/dL 12.3(L) 12.5(L) 11.1(L)  Hematocrit 37.5 - 51.0 % 41.3 41.9 36.7(L)  Platelets 150 - 450 x10E3/uL 302 230 239    Lab Results  Component Value Date/Time   VD25OH 24.8 (L) 05/13/2021 10:01 AM    Clinical ASCVD: No  The ASCVD Risk score (Arnett DK, et al., 2019) failed to calculate for the following reasons:   The valid total cholesterol range is 130 to 320 mg/dL   Social History   Tobacco Use  Smoking Status Former   Packs/day: 2.00   Years: 16.00   Pack years: 32.00   Types: Cigarettes   Quit date: 08/23/2017   Years since quitting: 3.7  Smokeless Tobacco Never   BP Readings from Last 3 Encounters:  05/19/21 (!) 150/82  05/13/21 (!) 162/88  04/19/21 120/84   Pulse Readings from Last 3 Encounters:  05/19/21 77  05/13/21 94  04/19/21 70   Wt Readings from Last 3 Encounters:  05/13/21 (!) 487 lb 1.1 oz (220.9 kg)  04/26/21 (!) 474 lb (215 kg)  04/19/21 (!) 486 lb 12.8 oz (220.8 kg)    Assessment: Review of patient past medical history, allergies, medications, health status, including review of consultants reports, laboratory and other test data, was performed as part of comprehensive evaluation and provision of chronic care management services.   SDOH:  (Social Determinants of Health) assessments and interventions performed:  SDOH Interventions    Flowsheet Row Most Recent Value  SDOH Interventions   SDOH Interventions for the Following Domains Financial Strain  Financial Strain Interventions Other (Comment)  [Medication Assistance Programs currently active]       CCM Care Plan  No Known Allergies  Medications Reviewed Today     Reviewed by Beryle Lathe, [place] (Pharmacist) on 05/31/21 at 0904  Med List Status: <None>   Medication Order Taking? Sig Documenting Provider Last Dose Status Informant   acetaminophen (TYLENOL) 325 MG tablet [id number] Yes Take 650 mg by mouth every 6 (six) hours as needed for mild pain. [provider] Taking Active Self  albuterol (PROVENTIL) (2.5 MG/3ML) 0.083% nebulizer solution [id number] No INHALE 1 VIAL VIA NEBULIZER EVERY 6 HOURS AS NEEDED FOR WHEEZING OR SHORTNESS OF BREATH  Patient not taking: Reported on 05/31/2021   [name], MD Not Taking Active Self  albuterol (VENTOLIN HFA) 108 (90 Base) MCG/ACT inhaler [id number] Yes INHALE 2 PUFFS INTO THE LUNGS EVERY 6 (SIX) HOURS AS NEEDED FOR WHEEZING OR SHORTNESS OF BREATH. [name], MD Taking Active Self           Med Note Lia Hopping, Toniann Fail   Fri Jan 21, 2021  8:16 AM)    allopurinol (ZYLOPRIM) 300 MG tablet [id number] Yes TAKE 1 TABLET EVERY DAY [name], MD Taking Active   ammonium lactate (AMLACTIN) 12 % lotion [id number] Yes Apply 1 application topically as needed for dry skin. Felipa Furnace, DPM Taking Active Self  atorvastatin (LIPITOR) 40 MG tablet  [id number] Yes TAKE 1 TABLET EVERY DAY [name], MD Taking Active Self  Blood Glucose Monitoring Suppl (TRUE METRIX METER) w/Device KIT [id number]  USE AS DIRECTED [name], MD  Active Self  buPROPion (WELLBUTRIN XL) 300 MG 24 hr tablet [id number] Yes Take 1 tablet (300 mg total) by mouth daily. [name], MD Taking Active Self  Cholecalciferol 125 MCG (5000 UT) TABS [id number] Yes Take 1 tablet by mouth daily. [provider] Taking Active Self  dexamethasone (DECADRON) 6 MG tablet [id number] Yes Take 1 tablet (6 mg total) by mouth daily. [name], MD Taking Active   diclofenac Sodium (VOLTAREN) 1 % GEL [id number] Yes Apply 2 g topically 4 (four) times daily. Mordecai Rasmussen, MD Taking Active Self  docusate sodium (COLACE) 100 MG capsule [id number] Yes Take 2 capsules (200 mg total) by mouth 2 (two) times daily. Thurnell Lose, MD Taking Active Self           Med Note Celesta Aver, [facility] M   Mon Feb 21, 2021   3:06 PM) 2 once a day  empagliflozin (JARDIANCE) 10 MG TABS tablet [id number] No Take 1 tablet (10 mg total) by mouth daily before breakfast.  Patient not taking: Reported on 05/31/2021   Erma Heritage, PA-C Not Taking Active Self           Med Note Currie Paris May 31, 2021  8:52 AM) On hold per cardiology  FEROSUL 325 (65 Fe) MG tablet [id number] Yes TAKE 1 TABLET EVERY DAY [name], MD Taking Active Self  fexofenadine (ALLEGRA) 180 MG tablet [id number] Yes Take 180 mg by mouth daily. [provider] Taking Active Self  gabapentin (NEURONTIN) 100 MG capsule [id number] Yes TAKE 1 CAPSULE IN THE MORNING AND TAKE 2 CAPSULES IN THE EVENING [name], MD Taking Active Self  glipiZIDE (GLUCOTROL XL) 5 MG 24 hr tablet [id number] Yes Take 1 tablet (5 mg total) by mouth daily with breakfast. [name], MD Taking Active Self  hydrOXYzine (ATARAX/VISTARIL) 25 MG tablet [id number] Yes Take 1 tablet (25 mg total) by mouth every 8 (eight) hours as needed. [name], MD Taking Active Self  Krill Oil 350 MG CAPS [id number] Yes Take 1 capsule by mouth in the morning and at bedtime.  [provider] Taking Active Self           Med Note Lia Hopping, MINDY L   Fri Jan 21, 2021  8:16 AM)    lidocaine-prilocaine (EMLA) cream [id number] Yes Apply 1 application topically as needed. Chase Picket, MD Taking Active Self  lisinopril (ZESTRIL) 20 MG tablet [id number] Yes Take 1 tablet (20 mg total) by mouth daily. [name], MD Taking Active   metFORMIN (GLUCOPHAGE) 850 MG tablet [id number] Yes TAKE 1 TABLET TWICE DAILY WITH MEALS [name], MD Taking Active Self  metolazone (ZAROXOLYN) 5 MG tablet [id number] Yes TAKE 1 TABLET (5 MG TOTAL) BY MOUTH THREE TIMES A WEEK. Thompson Grayer, MD Taking Active Self           Med Note Currie Paris Apr 19, 2021  2:43 PM) Using as needed of cardiology   metoprolol tartrate (LOPRESSOR) 100 MG tablet  [id number] Yes TAKE 1 TABLET TWICE DAILY Josue Hector, MD Taking Active Self  pantoprazole (PROTONIX) 40 MG tablet [id number] Yes TAKE 1 TABLET EVERY DAY [name], MD Taking Active   polyethylene glycol (Coke)  17 g packet [id number] Yes Take 17 g by mouth daily as needed. Thurnell Lose, MD Taking Active Self  potassium chloride SA (KLOR-CON) 20 MEQ tablet [id number] Yes TAKE 3 TABS DAILY AND TAKE AN ADDITIONAL 1 TAB ON THE DAY YOU TAKE YOUR WEEKLY METOLAZONE DOSE Ahmed Prima, Fransisco Hertz, PA-C Taking Active Self  rivaroxaban (XARELTO) 20 MG TABS tablet [id number] Yes TAKE 1 Tablet BY MOUTH ONCE EVERY DAY WITH SUPPER Josue Hector, MD Taking Active Self  Semaglutide (RYBELSUS) 14 MG TABS [id number] Yes Take 14 mg by mouth in the morning. [name], MD Taking Active Self           Med Note Jim Like Mar 03, 2021  9:35 AM) Gets from patient assistance ([employer])  tamsulosin [facility]) 0.4 MG CAPS capsule [id number] Yes Take 1 capsule (0.4 mg total) by mouth daily. [place], Reece Leader., MD Taking Active   terbinafine (LAMISIL) 250 MG tablet [id number] Yes Take 1 tablet by mouth once daily [name], MD Taking Active Self  Tiotropium Bromide Monohydrate (SPIRIVA RESPIMAT) 2.5 MCG/ACT AERS [id number] Yes Inhale 2 puffs into the lungs daily. [name], MD Taking Active Self           Med Note Jim Like Mar 03, 2021  9:34 AM) Gets from patient assistance [facility] Cares)  torsemide (DEMADEX) 20 MG tablet [id number] Yes Take 2 tablets (40 mg total) by mouth 2 (two) times daily. Erma Heritage, [place] Taking Active Self  TRUE METRIX BLOOD GLUCOSE TEST test strip [id number]  TEST ONE TIME DAILY FOR DIABETES [name], MD  Active Self  TRUEplus Lancets 33G MISC [id number]  USE TO TEST ONE TIME DAILY FOR DIABETES [name], MD  Active Self            Patient Active Problem List   Diagnosis Date Noted   Pain due to onychomycosis of  toenails of both feet 05/02/2021   Diabetic neuropathy ([place]) 05/02/2021   Callus of foot 05/02/2021   Sore throat 04/26/2021   BPH with obstruction/lower urinary tract symptoms 04/05/2021   Acute on chronic diastolic CHF (congestive heart failure) ([place]) 03/25/2021   Morbid obesity with BMI of 60.0-69.9, adult ([place]) 03/25/2021   Mixed hyperlipidemia 03/24/2021   Hematuria 03/24/2021   Atrial fibrillation, chronic (HCC) 03/24/2021   Prolonged QT interval 03/24/2021   Iron deficiency anemia 02/21/2021   Constipation 02/21/2021   Rectal bleeding    Chronic blood loss anemia 11/12/2020   Gastrointestinal hemorrhage 10/08/2020   S/P placement of cardiac pacemaker 02/10/2020   Encounter for examination following treatment at hospital 02/10/2020   Anxiety 02/10/2020   DM2 (diabetes mellitus, type 2) ([place]) 02/10/2020   OSA on CPAP    History of pulmonary embolism 03/16/2019   Paroxysmal atrial fibrillation ([place]) 09/05/2018   COPD (chronic obstructive pulmonary disease) ([place]) 05/07/2018   Gastroesophageal reflux disease    Pulmonary embolism ([place]) 09/18/2017   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 08/23/2017   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   Pulmonary hypertension (HCC)    Chronic periodontitis 07/18/2017   Morbid obesity ([place])    Essential hypertension    Tobacco abuse    Chronic heart failure with preserved ejection fraction (HFpEF) (HCC)    Chronic venous insufficiency     Immunization History  Administered Date(s) Administered   Influenza Inj Mdck Quad Pf 02/27/2019   Influenza,inj,Quad PF,6+  Mos 02/10/2020, 03/23/2021   Janssen (J&J) SARS-COV-2 Vaccination 09/16/2019   Moderna SARS-COV2 Booster Vaccination 03/23/2021   PFIZER(Purple Top)SARS-COV-2 Vaccination 03/15/2020   Pneumococcal Polysaccharide-23 03/17/2019   Tdap 03/23/2021    Conditions to be addressed/monitored: Atrial Fibrillation, CHF,  HTN, HLD, COPD, DMII, Anxiety, and Depression  Care Plan : Medication Management  Updates made by Beryle Lathe, [place] since 05/31/2021 12:00 AM     Problem: COPD, HFpEF, HTN, T2DM, Afib, anxiety/depression   Priority: High  Onset Date: 12/28/2020     Long-Range Goal: Disease Progression Prevention   Start Date: 12/28/2020  Expected End Date: 03/28/2021  Recent Progress: On track  Priority: High  Note:   Current Barriers:  Unable to independently monitor therapeutic efficacy  Pharmacist Clinical Goal(s):  Over the next 90 days, patient will achieve adherence to monitoring guidelines and medication adherence to achieve therapeutic efficacy through collaboration with PharmD and provider.   Interventions: 1:1 collaboration with [name], MD regarding development and update of comprehensive plan of care as evidenced by provider attestation and co-signature Inter-disciplinary care team collaboration (see longitudinal plan of care) Comprehensive medication review performed; medication list updated in electronic medical record  Type-2 diabetes - Goal on Track (progressing): YES.: Controlled; Most recent A1c at goal of <7% per ADA guidelines Current medications: metformin 850 mg by mouth twice daily, glipizide XL 5 mg by mouth with breakfast, and semaglutide (Rybelsus)  14 mg by mouth daily   Jardiance currently on hold per cardiology due to urinary tract infection  Patient is approved for Rybelsus through [employer] patient assistance program through 04/20/22 and Jardiance through [facility] patient assistance program through 05/21/22 Intolerances: none Taking medications as directed: yes Side effects thought to be attributed to current medication regimen: GI upset with metformin and urinary tract infection possible related to Jardiance Patient denies recent hypoglycemia (lowest home blood glucose 139) Current meal patterns: breakfast: eggs, oatmeal, chicken sandwhich, and baked  beans ; lunch:  ribeye and [employer] Breast in air fryer ; dinner:  leftovers from lunch ; snacks: none; drinks: water, regular soda, milk, and sports drinks Has noticed decreased appetite since starting Rybelsus. No nausea or vomiting Current exercise: none Current glucose readings over last week: fasting blood glucose mostly 139-174 (outlier 236); elevated blood glucose may be related to current dexamethasone use for covid infection Hypoglycemia: I have discussed with the patient how to treat hypoglycemia by the rule of 15; eat/drink 15g of sugar in the form of glucose tabs, 4 ounces of juice or soda and recheck fingerstick glucose in 15 minutes. Chocolate bars and ice cream should be avoided because fat delays carbohydrate digestion and absorption. Retreat if glucose remains low. Driving should cease until glucose is normal. Instructed to monitor blood sugars once a day at the following times: fasting (at least 8 hours since last food consumption), bedtime, and whenever patient experiences symptoms of hypo/hyperglycemia Continue metformin 850 mg by mouth twice daily, glipizide XL 5 mg by mouth with breakfast, and semaglutide (Rybelsus) 14 mg by mouth at least 30 minutes before the first food, beverage, or other oral medications of the day with no more than 4 ounces of plain water only Remain off Jardiance per cardiology. May consider restarting at some point  Hypertension  - Condition stable. Not addressed this visit.:: Factors affecting control of BP include high salt intake, elevated BMI, lack of exercise, sleep apnea, and volume overload. Blood pressure under fair control. Blood pressure goal is <130/80 mmHg per 2017  AHA/ACC guidelines. Current medications: lisinopril 20 mg by mouth once daily, metoprolol tartrate 100 mg by mouth twice daily, torsemide 40 mg by mouth twice daily + potassium repletion 60 mEq once daily , and metolazone 5 mg by mouth as needed + additional potassium repletion 20  mEq daily  Lisinopril recently restarted per PCP since serum creatinine improved back to baseline Normally only needs metolazone around once per week Has obstructive sleep apnea on CPAP Intolerances: none Taking medications as directed: yes Side effects thought to be attributed to current medication regimen: no Reports some hypotension/dizziness when he coughs. Will continue to monitor. Recent home blood pressure readings: not discussed today Encourage dietary sodium restriction/DASH diet. Recommend regular aerobic exercise Recommend home blood pressure monitoring, to bring results in next visit Discussed need for and importance of continued work on weight loss Reviewed risks of hypertension, principles of treatment and consequences of untreated hypertension Continue current medications as above. Continue diuretics per cardiology.   Heart failure with preserved ejection fraction (LVEF >50% with evidence of spontaneous or provokable increased LV filling pressures) - Condition stable. Not addressed this visit.: Appropriately managed Has pacemaker Current treatment: lisinopril 20 mg by mouth once daily, metoprolol tartrate 100 mg by mouth twice daily, torsemide 40 mg by mouth twice daily + potassium repletion 60 mEq once daily , and metolazone 5 mg by mouth as needed + additional repletion 20 mEq daily Lisinopril recently restarted per PCP since serum creatinine improved back to baseline. Jardiance currently on hold per cardiology due to urinary tract infection.  Stage C (Symptomatic heart failure)/NYHA Class III (Marked limitation of physical activity. Comfortable at rest. Less than ordinary activity causes fatigue, palpitation, or dyspnea) Most recent echocardiogram was in March 2022. LVEF was stable at 60-65%. BNP: 67 (01/30/21) Current home vitals: BP 130s/80s Current home weights: mostly stable 470-480s Reports shortness of breath with activity or when lying down, fatigue and weakness,  swelling in the legs, ankles and feet, and reduced ability to exercise. Denies persistent cough or wheezing, nausea and lack of appetite, and chest pain Encourage dietary sodium restriction (<3 g/day) Educated on the importance of weighing daily. Patient aware to contact cardiology/primary care team if weight gain >3 lbs in 1 day or >5 lbs in 1 week Recommend home blood pressure monitoring, to bring results in next visit Discussed need for and importance of continued work on weight loss Recommend restricted fluid intake (eg, 2 L/day) Remain off Jardiance per cardiology Continue beta blocker, ACE inhibitor, and diuretics  Chronic Obstructive Pulmonary Disease - Goal on Track (progressing): YES.: Uncontrolled per patient - complicated by possible nasal obstruction. Patient reports he has nose surgery in 1 month. Patient sleeping better with CPAP adjustments per Dr. Elsworth Soho Stopped smoking 08/23/2017; smoked for 30 years (1 PPD) Current treatment: albuterol metered dose (ProAir, Ventolin, Proventil) 1 puff by mouth as needed for shortness of breath and tiotropium (Spiriva Respimat) 2 inhalations once daily (Receives from [employer]) Patient reports only using albuterol a couple times per week but often sounds out of breath which is likely complicated by his heart failure GOLD Classification: unknown CAT score (12/28/20): 23 Most recent Pulmonary Function Testing: N/A 0 exacerbations requiring treatment in the last 6 months  Current oxygen requirements: none Continue Spiriva 2 puffs once daily and albuterol as needed for shortness of breath   Patient was instructed on the appropriate inhaler technique for Spiriva Respimat on 02/08/21  Atrial Fibrillation - Condition stable. Not addressed this visit.: Controlled Current rate  control: metoprolol tartrate 100 mg by mouth twice daily Most recent ECG: normal sinus Anticoagulation: rivaroxaban (Xarelto) 20 mg by mouth once daily Denies signs and symptoms of  bleeding CHADS2VASc score: 3 (CHF, hypertension, and diabetes) Prior ablation: _0  Yes  _1  No Home blood pressure: 130s/80s Home heart rate: unknown Encouraged regular physical activity for general health benefits Recommend home blood pressure and heart rate monitoring, to bring results in next visit Patient may qualify for patient assistance for Xarelto after he has spent at least 4% of his gross annual income at the pharmacy. Continue metoprolol tartrate 100 mg by mouth twice daily and rivaroxaban (Xarelto) 20 mg by mouth once daily  Depression and Anxiety - Condition stable. Not addressed this visit.: Controlled per patient Current medications: bupropion (Wellbutrin) 300 mg by mouth once daily and hydroxyzine 25 mg by mouth every 8 hours Current non-pharmacologic treatment: None, patient declines talk therapy at this time despite encouragement to consider Current symptoms include: anhedonia, depressed mood, and fatigue Continue current medications as above  Patient Goals/Self-Care Activities Over the next 90 days, patient will:  take medications as prescribed as evidenced by patient report and record review check glucose twice daily (fasting and bedtime), document, and provide at future appointments check blood pressure at least once daily, document, and provide at future appointments weigh daily, and contact provider if weight gain of 3 lbs in a day or more than 5 lbs in a week  Follow Up Plan: Telephone follow up appointment with care management team member scheduled for: 07/06/21      Medication Assistance:  see care plan  Patient's preferred pharmacy is:  [facility] [address], [place] [address] Phone: [phone] Fax: [phone]  [facility] [place], [place] [address] Phone: [phone] Fax: [place], [place] [address] [place]. [place] [address] Phone: [phone] Fax: [phone] [facility], [place] [address] [place] [address] Phone: [phone] Fax: [phone]  Follow Up:  Patient agrees to Care Plan and Follow-up.  Plan: Telephone follow up appointment with care management team member scheduled for:  07/06/21  Kennon Holter, PharmD, [place], [place] Clinical Pharmacist Practitioner [facility] [phone]

## 2021-05-31 NOTE — Patient Instructions (Signed)
Reginald [name],  It was great to talk to you today!  Please call me with any questions or concerns.   Visit Information  Following are the goals we discussed today:  Patient Goals/Self-Care Activities Over the next 90 days, patient will:  take medications as prescribed as evidenced by patient report and record review check glucose twice daily (fasting and bedtime), document, and provide at future appointments check blood pressure at least once daily, document, and provide at future appointments weigh daily, and contact provider if weight gain of 3 lbs in a day or more than 5 lbs in a week  Plan: Telephone follow up appointment with care management team member scheduled for:  07/06/21  Kennon Holter, PharmD, BCACP, CPP Clinical Pharmacist Practitioner [facility] [phone]  Please call the care guide team at [phone] if you need to cancel or reschedule your appointment.   Patient verbalizes understanding of instructions provided today and agrees to view in [place].

## 2021-05-31 NOTE — Telephone Encounter (Signed)
Prescription refill request for Xarelto received.  Indication: PAF Last office visit: 04/19/21  B Strader PA-C Weight: 220.8kg Age: 51 Scr: 1.43 on 05/13/21 CrCl: 193.01  Based on above findings Xarelto 20mg  daily is the appropriate dose.  Refill approved.

## 2021-05-31 NOTE — Chronic Care Management (AMB) (Signed)
error 

## 2021-06-01 ENCOUNTER — Other Ambulatory Visit: Payer: Self-pay

## 2021-06-01 ENCOUNTER — Ambulatory Visit (INDEPENDENT_AMBULATORY_CARE_PROVIDER_SITE_OTHER): Payer: Medicare HMO | Admitting: Urology

## 2021-06-01 ENCOUNTER — Encounter: Payer: Self-pay | Admitting: Urology

## 2021-06-01 ENCOUNTER — Other Ambulatory Visit: Payer: Self-pay | Admitting: Internal Medicine

## 2021-06-01 VITALS — BP 135/71 | HR 65 | Ht 71.0 in | Wt >= 6400 oz

## 2021-06-01 DIAGNOSIS — N401 Enlarged prostate with lower urinary tract symptoms: Secondary | ICD-10-CM

## 2021-06-01 DIAGNOSIS — R310 Gross hematuria: Secondary | ICD-10-CM

## 2021-06-01 DIAGNOSIS — R8290 Unspecified abnormal findings in urine: Secondary | ICD-10-CM

## 2021-06-01 DIAGNOSIS — J449 Chronic obstructive pulmonary disease, unspecified: Secondary | ICD-10-CM

## 2021-06-01 DIAGNOSIS — N138 Other obstructive and reflux uropathy: Secondary | ICD-10-CM | POA: Diagnosis not present

## 2021-06-01 MED ORDER — CIPROFLOXACIN HCL 500 MG PO TABS
500.0000 mg | ORAL_TABLET | Freq: Once | ORAL | Status: AC
  Administered 2021-06-01: 500 mg via ORAL

## 2021-06-01 NOTE — Progress Notes (Signed)
Assessment: 1. Gross hematuria   2. BPH with obstruction/lower urinary tract symptoms     Plan: Findings on cystoscopy discussed with the patient. No evidence of serious or life-threatening cause for his episode of gross hematuria. Cipro x1 following cystoscopy. Continue tamsulosin 0.4 mg daily.  Return to office in 3 months   Chief Complaint:  Chief Complaint  Patient presents with   Hematuria   History of Present Illness:  Reginald [name] is a 51 y.o. year old male who is seen for further evaluation of gross hematuria and LUTS.  He reports a recent history of gross hematuria, dysuria, and urinary hesitancy.  His symptoms began in early November 2022.  He presented to the emergency room on 03/24/2021 and was found to be hypotensive.  Urinalysis showed >50 RBCs.  Urine culture showed no growth.  CT abdomen and pelvis without contrast from 03/24/2021 showed no renal abnormalities, no stones or hydronephrosis, normal-appearing bladder.  He was treated with antibiotics for possible UTI.  Following discharge, the gross hematuria resolved.  He continued to report some slight dysuria and hesitancy.  He also has urinary frequency and nocturia baseline.  No prior history of stones or UTIs. AUA score = 18.  CT abdomen pelvis with contrast from 11/12/2020 showed no renal mass or obstruction. PSA 11/22:  1.9  He was scheduled for cystoscopy on 05/19/21.  This was postponed due to possible UTI. He noted onset of increased frequency, urgency, and dysuria.  No gross hematuria.  No fevers, chills, or flank pain.  He continued on tamsulosin. Urine culture results not available.  He was treated with Omnicef x7 days. He presents today for cystoscopy. No further gross hematuria.  He continues with some frequency, urgency, and nocturia. He continues on tamsulosin. IPSS = 15 today.  Portions of the above documentation were copied from a prior visit for review purposes only.   Past Medical History:   Past Medical History:  Diagnosis Date   Anxiety    Aortic stenosis    Arthritis    Asthma    Back pain    Cellulitis of skin with lymphangitis    CHF (congestive heart failure) (HCC)    Chronic diastolic congestive heart failure (HCC)    Chronic venous insufficiency    Constipation    COPD (chronic obstructive pulmonary disease) (HCC)    Depression    Diabetes ([place])    Dyspnea    Essential hypertension    Gout    Heart murmur    Hypertension    Morbid obesity ([place])    Obesity    Pneumonia    walking pneumonia   Prostatitis    Pulmonary embolism ([place])    Pulmonary hypertension (HCC)    Pyelonephritis    S/P aortic valve replacement with bioprosthetic valve 08/23/2017   25 mm Edwards Inspiris Resilia stented bovine pericardial tissue valve   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   24 mm Hemashield supracoronary straight graft    Sleep apnea    cpap    Thoracic ascending aortic aneurysm    Thoracic ascending aortic aneurysm    Tobacco abuse     Past Surgical History:  Past Surgical History:  Procedure Laterality Date   AORTIC VALVE REPLACEMENT N/A 08/23/2017   Procedure: AORTIC VALVE REPLACEMENT (AVR) USING INSPIRIS RESILIA AORTIC VALVE SIZE 25 MM;  Surgeon: Rexene Alberts, MD;  Location: [place];  Service: Open Heart Surgery;  Laterality: N/A;   CARDIAC VALVE REPLACEMENT N/A  Phreesia 02/07/2020   COLONOSCOPY WITH PROPOFOL N/A 11/14/2020   Procedure: COLONOSCOPY WITH PROPOFOL;  Surgeon: Gatha Mayer, MD;  Location: [facility] ENDOSCOPY;  Service: Endoscopy;  Laterality: N/A;   ESOPHAGOGASTRODUODENOSCOPY (EGD) WITH PROPOFOL N/A 11/14/2020   Procedure: ESOPHAGOGASTRODUODENOSCOPY (EGD) WITH PROPOFOL;  Surgeon: Gatha Mayer, MD;  Location: [place];  Service: Endoscopy;  Laterality: N/A;   MULTIPLE EXTRACTIONS WITH ALVEOLOPLASTY N/A 07/23/2017   Procedure: Extraction of tooth #10 with alveoloplasty and gross debridement of remaining teeth;  Surgeon: Lenn Cal, DDS;  Location: [place];  Service: Oral Surgery;  Laterality: N/A;   PACEMAKER IMPLANT N/A 08/28/2017    St Jude Medical Assurity MRI conditional  dual-chamber pacemaker for symptomatic complete heart blockby Dr Rayann Heman   TEE WITHOUT CARDIOVERSION N/A 07/16/2017   Procedure: TRANSESOPHAGEAL ECHOCARDIOGRAM (TEE);  Surgeon: Lelon Perla, MD;  Location[facility] ENDOSCOPY;  Service: Cardiovascular;  Laterality: N/A;   TEE WITHOUT CARDIOVERSION N/A 08/23/2017   Procedure: TRANSESOPHAGEAL ECHOCARDIOGRAM (TEE);  Surgeon: Rexene Alberts, MD;  Location: [place];  Service: Open Heart Surgery;  Laterality: N/A;   THORACIC AORTIC ANEURYSM REPAIR N/A 08/23/2017   Procedure: THORACIC ASCENDING ANEURYSM REPAIR (AAA) USING HEMASHIELD GOLD KNITTED MICROVEL DOUBLE VELOUR VASCULAR GRAFT D: 24 MM  L: 30 CM;  Surgeon: Rexene Alberts, MD;  Location: [place];  Service: Open Heart Surgery;  Laterality: N/A;    Allergies:  No Known Allergies  Family History:  Family History  Problem Relation Age of Onset   Hypertension Mother    Alzheimer's disease Mother    Heart attack Mother    Heart attack Brother    Diabetes Brother     Social History:  Social History   Tobacco Use   Smoking status: Former    Packs/day: 2.00    Years: 16.00    Pack years: 32.00    Types: Cigarettes    Quit date: 08/23/2017    Years since quitting: 3.7   Smokeless tobacco: Never  Vaping Use   Vaping Use: Never used  Substance Use Topics   Alcohol use: No    Comment: have had alcohol in the past, not heavy   Drug use: No   ROS: Constitutional:  Negative for fever, chills, weight loss CV: Negative for chest pain, previous MI, hypertension Respiratory:  Negative for shortness of breath, wheezing, sleep apnea, frequent cough GI:  Negative for nausea, vomiting, bloody stool, GERD  Physical exam: BP 135/71 (BP Location: Right Arm, Cuff Size: Large)    Pulse 65    Ht 5\' 11"  (1.803 m)    Wt (!) 482 lb (218.6 kg)    BMI  67.23 kg/m  GENERAL APPEARANCE:  Well appearing, well developed, well nourished, NAD HEENT:  Atraumatic, normocephalic, oropharynx clear NECK:  Supple without lymphadenopathy or thyromegaly ABDOMEN:  Soft, non-tender, no masses EXTREMITIES:  Moves all extremities well, without clubbing, cyanosis, or edema NEUROLOGIC:  Alert and oriented x 3, normal gait, CN II-XII grossly intact MENTAL STATUS:  appropriate BACK:  Non-tender to palpation, No CVAT SKIN:  Warm, dry, and intact   Results: U/A dipstick negative  Procedure:  Flexible Cystourethroscopy  Pre-operative Diagnosis: Gross hematuria  Post-operative Diagnosis: Gross hematuria  Anesthesia:  local with lidocaine jelly  Surgical Narrative:  After appropriate informed consent was obtained, the patient was prepped and draped in the usual sterile fashion in the supine position.  He was correctly identified and the proper procedure delineated prior to proceeding.  Sterile lidocaine gel was  instilled in the urethra. The flexible cystoscope was introduced without difficulty.  Findings:  Anterior urethra: Normal  Posterior urethra: Lateral lobe hypertrophy  Bladder: Normal  Ureteral orifices: normal  Additional findings: None  Saline bladder wash for cytology was not performed.    The cystoscope was then removed.  The patient tolerated the procedure well.

## 2021-06-02 LAB — MICROSCOPIC EXAMINATION
Bacteria, UA: NONE SEEN
Epithelial Cells (non renal): NONE SEEN /hpf (ref 0–10)
RBC, Urine: NONE SEEN /hpf (ref 0–2)
Renal Epithel, UA: NONE SEEN /hpf
WBC, UA: NONE SEEN /hpf (ref 0–5)

## 2021-06-02 LAB — URINALYSIS, ROUTINE W REFLEX MICROSCOPIC
Bilirubin, UA: NEGATIVE
Glucose, UA: NEGATIVE
Ketones, UA: NEGATIVE
Leukocytes,UA: NEGATIVE
Nitrite, UA: NEGATIVE
Protein,UA: NEGATIVE
Specific Gravity, UA: 1.01 (ref 1.005–1.030)
Urobilinogen, Ur: 0.2 mg/dL (ref 0.2–1.0)
pH, UA: 5.5 (ref 5.0–7.5)

## 2021-06-06 NOTE — Progress Notes (Signed)
Remote pacemaker transmission.   

## 2021-06-07 ENCOUNTER — Telehealth: Payer: Self-pay | Admitting: Cardiovascular Disease

## 2021-06-07 NOTE — Telephone Encounter (Signed)
° °  Pre-operative Risk Assessment    Patient Name: Reginald [name]  DOB: 04/08/1971 MRN: [id number]      Request for Surgical Clearance    Procedure:   Septoplasty Bilateral Turbinate Reduction  Date of Surgery:  Clearance TBD                                 Surgeon:  Tamsen Roers Teoh  Surgeon's Group or Practice Name:  [facility] MD, Ears, Nose,Throat, Head and Neck Surgery  Phone number:  [phone] Fax number:  [phone]   Type of Clearance Requested:   Pharmacy: asked to hold Xarelto for at least 5 days    Type of Anesthesia:  Not Indicated   Additional requests/questions:    Sandrea Hammond   06/07/2021, 8:26 AM

## 2021-06-07 NOTE — Telephone Encounter (Signed)
° °  Primary Cardiologist: Jenkins Rouge, MD  Chart reviewed as part of pre-operative protocol coverage. Given past medical history and time since last visit, based on ACC/AHA guidelines, Reginald [name] would be at acceptable risk for the planned procedure without further cardiovascular testing.   Patient with diagnosis of afib on Xarelto for anticoagulation.     Patient also has a hx of a single PE post AVR- was on warfarin but was never therapeutic.   Procedure: Septoplasty Bilateral Turbinate Reduction Date of procedure: TBD     CHA2DS2-VASc Score = 4   This indicates a 4.8% annual risk of stroke. [name] score is based upon: CHF History: 1 HTN History: 1 Diabetes History: 1 Stroke History: 0 Vascular Disease History: 1 Age Score: 0 Gender Score: 0       CrCl >100 ml/min   Per office protocol, patient can hold Xarelto for 3 days prior to procedure.     Based on pharmacokinetics Xarelto will be fully cleared in about 2 days, therefore a 5 day hold is not needed.  I will route this recommendation to the requesting party via Epic fax function and remove from pre-op pool.  Please call with questions.  Jossie Ng. Averill Pons NP-C    06/07/2021, 2:08 PM [facility] [address] Office [phone] Fax [phone]

## 2021-06-07 NOTE — Telephone Encounter (Signed)
Patient with diagnosis of afib on Xarelto for anticoagulation.    Patient also has a hx of a single PE post AVR- was on warfarin but was never therapeutic.  Procedure: Septoplasty Bilateral Turbinate Reduction Date of procedure: TBD   CHA2DS2-VASc Score = 4   This indicates a 4.8% annual risk of stroke. [name] score is based upon: CHF History: 1 HTN History: 1 Diabetes History: 1 Stroke History: 0 Vascular Disease History: 1 Age Score: 0 Gender Score: 0      CrCl >100 ml/min  Per office protocol, patient can hold Xarelto for 3 days prior to procedure.    Based on pharmacokinetics Xarelto will be fully cleared in about 2 days, therefore a 5 day hold is not needed.

## 2021-06-09 ENCOUNTER — Other Ambulatory Visit: Payer: Self-pay

## 2021-06-09 ENCOUNTER — Encounter: Payer: Self-pay | Admitting: Nurse Practitioner

## 2021-06-09 ENCOUNTER — Ambulatory Visit (INDEPENDENT_AMBULATORY_CARE_PROVIDER_SITE_OTHER): Payer: Medicare HMO | Admitting: Nurse Practitioner

## 2021-06-09 VITALS — BP 133/78 | HR 87 | Ht 71.0 in | Wt >= 6400 oz

## 2021-06-09 DIAGNOSIS — L0390 Cellulitis, unspecified: Secondary | ICD-10-CM

## 2021-06-09 DIAGNOSIS — R0602 Shortness of breath: Secondary | ICD-10-CM

## 2021-06-09 MED ORDER — SULFAMETHOXAZOLE-TRIMETHOPRIM 800-160 MG PO TABS: 1.0000 | ORAL_TABLET | Freq: Two times a day (BID) | ORAL | 0 refills | Status: DC

## 2021-06-09 NOTE — Progress Notes (Signed)
° °  Reginald [name]     MRN: [id number]      DOB: [birth year]    Mr. Cotto is here for c/o of sore on left leg for three week,, he robbed his foot against something and that lead to the sore, the wound is slow to heal, it has been Swelling up and draining. He denies fever , chills, he has chronic numbness and tingling. He can not see the wound at home , so he can not tell color of drainage. He has had wound on tha leg before, he went to wound care center at that time for wound care.    ROS Denies recent fever or chills. Denies sinus pressure, nasal congestion, ear pain or sore throat. Denies chest congestion, productive cough or wheezing, has had SOB since he had COVID Denies chest pains, palpitations and leg swelling Denies abdominal pain, nausea, vomiting,diarrhea or constipation.   Denies headaches, seizures, has had chronic  numbness, or tingling of hands and feet Denies depression, anxiety or insomnia. Has  skin break down or rash.   PE  BP 133/78    Pulse 87    Ht 5\' 11"  (1.803 m)    Wt (!) 499 lb 1.3 oz (226.4 kg)    SpO2 96%    BMI 69.61 kg/m   Patient alert and oriented and in no cardiopulmonary distress.   Chest: Clear to auscultation bilaterally.  CVS: S1, S2 no murmurs, no S3.Regular rate.  ABD: Soft non tender.   Ext: No edema  MS: Adequate ROM spine, shoulders, hips and knees using a cane   Skin: wound on left lower anterior leg, wound look clean without abnormal drainage, skin feels warm, no redness  Psych: Good eye contact, normal affect. Memory intact not anxious or depressed appearing.     Assessment & Plan

## 2021-06-09 NOTE — Assessment & Plan Note (Addendum)
wound on left lower anterior leg, wound looks clean without abnormal drainage, skin feels warm, no redness does not appear infected Ordered Septra double strength to be used 2 times daily for 10 days due to the patient having diabetes type 2.  Patient told to call the office if wound does not heal after taking antibiotics. Patient is up-to-date with his TDAP vaccine

## 2021-06-09 NOTE — Assessment & Plan Note (Signed)
Use albuterol inhaler as needed.

## 2021-06-09 NOTE — Patient Instructions (Signed)
Please take septra ds two times daily for 7 days for your wound.     It is important that you exercise regularly at least 30 minutes 5 times a week.  Think about what you will eat, plan ahead. Choose " clean, green, fresh or frozen" over canned, processed or packaged foods which are more sugary, salty and fatty. 70 to 75% of food eaten should be vegetables and fruit. Three meals at set times with snacks allowed between meals, but they must be fruit or vegetables. Aim to eat over a 12 hour period , example 7 am to 7 pm, and STOP after  your last meal of the day. Drink water,generally about 64 ounces per day, no other drink is as healthy. Fruit juice is best enjoyed in a healthy way, by EATING the fruit.  Thanks for choosing [facility], we consider it a privelige to serve you.

## 2021-06-13 ENCOUNTER — Encounter (INDEPENDENT_AMBULATORY_CARE_PROVIDER_SITE_OTHER): Payer: Self-pay | Admitting: Gastroenterology

## 2021-06-13 ENCOUNTER — Other Ambulatory Visit: Payer: Self-pay

## 2021-06-13 ENCOUNTER — Ambulatory Visit (INDEPENDENT_AMBULATORY_CARE_PROVIDER_SITE_OTHER): Payer: Medicare HMO | Admitting: Gastroenterology

## 2021-06-13 VITALS — BP 119/69 | HR 71 | Temp 99.3°F | Ht 71.0 in | Wt >= 6400 oz

## 2021-06-13 DIAGNOSIS — K921 Melena: Secondary | ICD-10-CM | POA: Diagnosis not present

## 2021-06-13 DIAGNOSIS — K5900 Constipation, unspecified: Secondary | ICD-10-CM

## 2021-06-13 DIAGNOSIS — D649 Anemia, unspecified: Secondary | ICD-10-CM

## 2021-06-13 NOTE — Patient Instructions (Addendum)
Lets recheck blood counts and iron studies today, if still abnormal, we will proceed with capsule study as we discussed. Please let me know if you develop any new or worsening GI symptoms.  As for your constipation, you can continue with the stool softeners if these are working, or you can try the miralax again, you may need to do 1 capful every 12 hours if 1 capful per day did not provide good result, previously, make sure you are staying well hydrated (please do not exceed the amount of water recommended by your heart doctor) make sure you are eating plenty of fruits, veggies and whole grains, kiwi and prunes are also good for constipation.   Follow up 6 months

## 2021-06-13 NOTE — Progress Notes (Signed)
Referring Provider: [name], MD Primary Care Physician:  [name], MD Primary GI Physician: Jenetta Downer  Chief Complaint  Patient presents with   Constipation    3 month follow up on constipation and IDA. Has noticed some blood in stools and dark stools but not every day. Has at least one stool a day.    HPI:   Reginald [name] is a 51 y.o. male with past medical history of severe AS (s/p bovine tissue AVR in 08/2017 with repair of ascending thoracic aortic aneurysm), HFpEF, paroxysmal A fib, CHF (s/p st. Jude PPM placement 08/2017) hx of bilateral PE, HTN, morbid obesity and OSA.   Patient presenting today for follow up of melena/anemia and constipation.   Last seen in clinic 02/21/21 with intermittent episodes of melena without BRBPR. He had not presented with any recent episodes of melena at last OV, colonoscopy and EGD in June 2022 were unremarkable for etiology of symptoms. He was advised to continue with iron supplementation and have CBC and iron studies rechecked in 3 months, if abnormal, would need to proceed with capsule endoscopy. Last hgb was 12.3 on 05/13/21 with MCV of 80 and Iron of 70.   He was also experiencing constipation with recommendations to take bowel prep and start miralax thereafter.   Today, he States that he has experienced a few black stools over the past few months, he states that last episode of melena was a few days ago with a solid stool. He has also noticed some BRBPR on occasion, usually in the toilet and small in volume, last episode was a few weeks ago.  Denies any abdominal pain, nausea or vomiting. He reports ongoing fatigue, sob and dizziness at times. Reports that blood sugar and BP have been good as he checks those when he feels dizzy.   Has 1-2 BMs per day. He states that he took miralax for about 1 week but states that he felt like it was making his stools too large which were painful when coming out, he is now just taking a stool softener BID,  having BM 1-2x/day and typically stools are relatively soft, however, they sometimes appear flat/thinner with stool softener.   Last Colonoscopy:11/14/20 Dr. Carlean Purl, external and internal hemorrhoids, exam otherwise normal Last Endoscopy: 11/14/20 Dr. Carlean Purl, normal esophagus, normal stomach, normal examined duodenum  Past Medical History:  Diagnosis Date   Anxiety    Aortic stenosis    Arthritis    Asthma    Back pain    Cellulitis of skin with lymphangitis    CHF (congestive heart failure) (HCC)    Chronic diastolic congestive heart failure (HCC)    Chronic venous insufficiency    Constipation    COPD (chronic obstructive pulmonary disease) (HCC)    Depression    Diabetes ([place])    Dyspnea    Essential hypertension    Gout    Heart murmur    Hypertension    Morbid obesity ([place])    Obesity    Pneumonia    walking pneumonia   Prostatitis    Pulmonary embolism ([place])    Pulmonary hypertension (HCC)    Pyelonephritis    S/P aortic valve replacement with bioprosthetic valve 08/23/2017   25 mm Edwards Inspiris Resilia stented bovine pericardial tissue valve   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   24 mm Hemashield supracoronary straight graft    Sleep apnea    cpap    Thoracic ascending aortic aneurysm  Thoracic ascending aortic aneurysm    Tobacco abuse     Past Surgical History:  Procedure Laterality Date   AORTIC VALVE REPLACEMENT N/A 08/23/2017   Procedure: AORTIC VALVE REPLACEMENT (AVR) USING INSPIRIS RESILIA AORTIC VALVE SIZE 25 MM;  Surgeon: Rexene Alberts, MD;  Location: [place];  Service: Open Heart Surgery;  Laterality: N/A;   CARDIAC VALVE REPLACEMENT N/A    Phreesia 02/07/2020   COLONOSCOPY WITH PROPOFOL N/A 11/14/2020   Procedure: COLONOSCOPY WITH PROPOFOL;  Surgeon: Gatha Mayer, MD;  Location: [facility] ENDOSCOPY;  Service: Endoscopy;  Laterality: N/A;   ESOPHAGOGASTRODUODENOSCOPY (EGD) WITH PROPOFOL N/A 11/14/2020   Procedure:  ESOPHAGOGASTRODUODENOSCOPY (EGD) WITH PROPOFOL;  Surgeon: Gatha Mayer, MD;  Location: [place];  Service: Endoscopy;  Laterality: N/A;   MULTIPLE EXTRACTIONS WITH ALVEOLOPLASTY N/A 07/23/2017   Procedure: Extraction of tooth #10 with alveoloplasty and gross debridement of remaining teeth;  Surgeon: Lenn Cal, DDS;  Location: [place];  Service: Oral Surgery;  Laterality: N/A;   PACEMAKER IMPLANT N/A 08/28/2017    St Jude Medical Assurity MRI conditional  dual-chamber pacemaker for symptomatic complete heart blockby Dr Rayann Heman   TEE WITHOUT CARDIOVERSION N/A 07/16/2017   Procedure: TRANSESOPHAGEAL ECHOCARDIOGRAM (TEE);  Surgeon: Lelon Perla, MD;  Location: [facility] ENDOSCOPY;  Service: Cardiovascular;  Laterality: N/A;   TEE WITHOUT CARDIOVERSION N/A 08/23/2017   Procedure: TRANSESOPHAGEAL ECHOCARDIOGRAM (TEE);  Surgeon: Rexene Alberts, MD;  Location: [place];  Service: Open Heart Surgery;  Laterality: N/A;   THORACIC AORTIC ANEURYSM REPAIR N/A 08/23/2017   Procedure: THORACIC ASCENDING ANEURYSM REPAIR (AAA) USING HEMASHIELD GOLD KNITTED MICROVEL DOUBLE VELOUR VASCULAR GRAFT D: 24 MM  L: 30 CM;  Surgeon: Rexene Alberts, MD;  Location: [place];  Service: Open Heart Surgery;  Laterality: N/A;    Current Outpatient Medications  Medication Sig Dispense Refill   acetaminophen (TYLENOL) 325 MG tablet Take 650 mg by mouth every 6 (six) hours as needed for mild pain.     albuterol (PROVENTIL) (2.5 MG/3ML) 0.083% nebulizer solution INHALE 1 VIAL VIA NEBULIZER EVERY 6 HOURS AS NEEDED FOR WHEEZING OR SHORTNESS OF BREATH 270 mL 1   albuterol (VENTOLIN HFA) 108 (90 Base) MCG/ACT inhaler INHALE 2 PUFFS INTO THE LUNGS EVERY 6 (SIX) HOURS AS NEEDED FOR WHEEZING OR SHORTNESS OF BREATH. 1 each 1   allopurinol (ZYLOPRIM) 300 MG tablet TAKE 1 TABLET EVERY DAY 90 tablet 0   atorvastatin (LIPITOR) 40 MG tablet TAKE 1 TABLET EVERY DAY 90 tablet 3   Blood Glucose Monitoring Suppl (TRUE METRIX METER) w/Device  KIT USE AS DIRECTED 1 kit 0   buPROPion (WELLBUTRIN XL) 300 MG 24 hr tablet Take 1 tablet (300 mg total) by mouth daily. 90 tablet 3   Cholecalciferol 125 MCG (5000 UT) TABS Take 1 tablet by mouth daily.     docusate sodium (COLACE) 100 MG capsule Take 2 capsules (200 mg total) by mouth 2 (two) times daily. 30 capsule 0   FEROSUL 325 (65 Fe) MG tablet TAKE 1 TABLET EVERY DAY 90 tablet 1   fexofenadine (ALLEGRA) 180 MG tablet Take 180 mg by mouth daily.     gabapentin (NEURONTIN) 100 MG capsule TAKE 1 CAPSULE IN THE MORNING AND TAKE 2 CAPSULES IN THE EVENING 270 capsule 0   glipiZIDE (GLUCOTROL XL) 5 MG 24 hr tablet Take 1 tablet (5 mg total) by mouth daily with breakfast.     hydrOXYzine (ATARAX/VISTARIL) 25 MG tablet Take 1 tablet (25 mg total) by  mouth every 8 (eight) hours as needed. 90 tablet 2   Krill Oil 350 MG CAPS Take 1 capsule by mouth in the morning and at bedtime.      lisinopril (ZESTRIL) 20 MG tablet Take 1 tablet (20 mg total) by mouth daily. 90 tablet 3   metFORMIN (GLUCOPHAGE) 850 MG tablet TAKE 1 TABLET TWICE DAILY WITH MEALS 180 tablet 1   metolazone (ZAROXOLYN) 5 MG tablet TAKE 1 TABLET (5 MG TOTAL) BY MOUTH THREE TIMES A WEEK. 36 tablet 3   metoprolol tartrate (LOPRESSOR) 100 MG tablet TAKE 1 TABLET TWICE DAILY 180 tablet 3   pantoprazole (PROTONIX) 40 MG tablet TAKE 1 TABLET EVERY DAY 90 tablet 3   potassium chloride SA (KLOR-CON) 20 MEQ tablet TAKE 3 TABS DAILY AND TAKE AN ADDITIONAL 1 TAB ON THE DAY YOU TAKE YOUR WEEKLY METOLAZONE DOSE 283 tablet 3   Semaglutide (RYBELSUS) 14 MG TABS Take 14 mg by mouth in the morning. 90 tablet 0   sulfamethoxazole-trimethoprim (BACTRIM DS) 800-160 MG tablet Take 1 tablet by mouth 2 (two) times daily. 14 tablet 0   tamsulosin (FLOMAX) 0.4 MG CAPS capsule Take 1 capsule (0.4 mg total) by mouth daily. 30 capsule 11   Tiotropium Bromide Monohydrate (SPIRIVA RESPIMAT) 2.5 MCG/ACT AERS Inhale 2 puffs into the lungs daily. 4 g 5   torsemide  (DEMADEX) 20 MG tablet Take 2 tablets (40 mg total) by mouth 2 (two) times daily. 360 tablet 3   TRUE METRIX BLOOD GLUCOSE TEST test strip TEST ONE TIME DAILY FOR DIABETES 100 strip 5   TRUEplus Lancets 33G MISC USE TO TEST ONE TIME DAILY FOR DIABETES 100 each 5   XARELTO 20 MG TABS tablet TAKE 1 TABLET BY MOUTH ONCE EVERY DAY WITH SUPPER 90 tablet 3   empagliflozin (JARDIANCE) 10 MG TABS tablet Take 1 tablet (10 mg total) by mouth daily before breakfast. (Patient not taking: Reported on 06/09/2021) 30 tablet 11   No current facility-administered medications for this visit.    Allergies as of 06/13/2021   (No Known Allergies)    Family History  Problem Relation Age of Onset   Hypertension Mother    Alzheimer's disease Mother    Heart attack Mother    Heart attack Brother    Diabetes Brother     Social History   Socioeconomic History   Marital status: Divorced    Spouse name: Not on file   Number of children: Not on file   Years of education: Not on file   Highest education level: Not on file  Occupational History   Not on file  Tobacco Use   Smoking status: Former    Packs/day: 2.00    Years: 16.00    Pack years: 32.00    Types: Cigarettes    Quit date: 08/23/2017    Years since quitting: 3.8   Smokeless tobacco: Never  Vaping Use   Vaping Use: Never used  Substance and Sexual Activity   Alcohol use: No    Comment: have had alcohol in the past, not heavy   Drug use: No   Sexual activity: Not Currently    Partners: Female  Other Topics Concern   Not on file  Social History Narrative   Not on file   Social Determinants of Health   Financial Resource Strain: Low Risk    Difficulty of Paying Living Expenses: Not hard at all  Food Insecurity: No Food Insecurity   Worried About Charity fundraiser  in the Last Year: Never true   [place] in the Last Year: Never true  Transportation Needs: No Transportation Needs   Lack of Transportation (Medical): No   Lack  of Transportation (Non-Medical): No  Physical Activity: Inactive   Days of Exercise per Week: 0 days   Minutes of Exercise per Session: 0 min  Stress: No Stress Concern Present   Feeling of Stress : Only a little  Social Connections: Socially Isolated   Frequency of Communication with Friends and Family: Once a week   Frequency of Social Gatherings with Friends and Family: Never   Attends Religious Services: Never   Printmaker: No   Attends Music therapist: More than 4 times per year   Marital Status: Never married   Review of systems General: negative for night sweats, fever, chills, weight loss +fatigue Neck: Negative for lumps, goiter, pain and significant neck swelling Resp: Negative for cough, wheezing, +dyspnea CV: Negative for chest pain, leg swelling, palpitations, orthopnea GI: denies  nausea, vomiting, diarrhea,  dysphagia, odyonophagia, early satiety or unintentional weight loss. +constipation +melena +hematochezia MSK: Negative for joint pain or swelling, back pain, and muscle pain. Derm: Negative for itching or rash Psych: Denies depression, anxiety, memory loss, confusion. No homicidal or suicidal ideation.  Heme: Negative for prolonged bleeding, bruising easily, and swollen nodes. Endocrine: Negative for cold or heat intolerance, polyuria, polydipsia and goiter. Neuro: negative for tremor, gait imbalance, syncope and seizures. The remainder of the review of systems is noncontributory.  Physical Exam: BP 119/69 (BP Location: Left Arm, Patient Position: Sitting, Cuff Size: Large)    Pulse 71    Temp 99.3 F (37.4 C) (Oral)    Ht $R'5\' 11"'vt$  (1.803 m)    Wt (!) 497 lb 8 oz (225.7 kg)    BMI 69.39 kg/m  General:   Alert and oriented. No distress noted. Pleasant and cooperative.  Head:  Normocephalic and atraumatic. Eyes:  Conjuctiva clear without scleral icterus. Mouth:  Oral mucosa pink and moist. Good dentition. No lesions. Heart:  Normal rate and rhythm, s1 and s2 heart sounds present.  Lungs: Clear lung sounds in all lobes. Respirations equal and unlabored. Abdomen:  +BS, soft, full but not taut or tender. No rebound or guarding. No HSM or masses noted. Derm: No palmar erythema or jaundice Msk:  Symmetrical without gross deformities. Normal posture. Extremities:  Without edema. Neurologic:  Alert and  oriented x4 Psych:  Alert and cooperative. Normal mood and affect.  Invalid input(s): 6 MONTHS   ASSESSMENT: Reginald [name] is a 51 y.o. male presenting today for follow up of anemia/melena and constipation.  Continues to have intermittent melena with occasional BRBPR, though he does have known internal and external hemorrhoids, he has no rectal pain, burning or itching. We will repeat CBC and iron studies today, if these are evident of IDA, we will proceed with givens capsule study, however, patient will need to be referred to tertiary care center if any lesions are found that would require further intervention due to morbid obesity. Indications, risks and benefits of procedure discussed in detail with patient. Patient verbalized understanding and is in agreement to proceed with capsule study if labs are abnormal.  As far as constipation, he did not feel that miralax worked well for him, taking two stool softeners per day with 1-2 soft BMs per day. Feels this is working well for him. We discussed making sure he is staying well  hydrated and eating a diet high in fruits, veggies and whole grains, kiwi and prunes are especially good to help with constipation. If he would like to try miralax again, he may need to increase miralax to one capful twice a day.    PLAN:  Continue with plenty of water ( do not exceed what was recommended by cards) 2. Increase fruits and veggies/whole grains 3. CBC and iron studies   Follow Up: 6 months  Bacilio Abascal L. Alver Sorrow, MSN, APRN, AGNP-C Adult-Gerontology Nurse Practitioner [facility]

## 2021-06-14 LAB — CBC
Hematocrit: 37.3 % — ABNORMAL LOW (ref 37.5–51.0)
Hemoglobin: 11.9 g/dL — ABNORMAL LOW (ref 13.0–17.7)
MCH: 24.9 pg — ABNORMAL LOW (ref 26.6–33.0)
MCHC: 31.9 g/dL (ref 31.5–35.7)
MCV: 78 fL — ABNORMAL LOW (ref 79–97)
Platelets: 254 10*3/uL (ref 150–450)
RBC: 4.77 x10E6/uL (ref 4.14–5.80)
RDW: 17.5 % — ABNORMAL HIGH (ref 11.6–15.4)
WBC: 9.4 10*3/uL (ref 3.4–10.8)

## 2021-06-14 LAB — IRON,TIBC AND FERRITIN PANEL
Ferritin: 279 ng/mL (ref 30–400)
Iron Saturation: 22 % (ref 15–55)
Iron: 85 ug/dL (ref 38–169)
Total Iron Binding Capacity: 387 ug/dL (ref 250–450)
UIBC: 302 ug/dL (ref 111–343)

## 2021-06-16 ENCOUNTER — Telehealth: Payer: Self-pay | Admitting: Internal Medicine

## 2021-06-16 ENCOUNTER — Encounter: Payer: Self-pay | Admitting: Internal Medicine

## 2021-06-16 ENCOUNTER — Ambulatory Visit: Payer: Medicare HMO | Admitting: *Deleted

## 2021-06-16 DIAGNOSIS — J449 Chronic obstructive pulmonary disease, unspecified: Secondary | ICD-10-CM

## 2021-06-16 DIAGNOSIS — I5032 Chronic diastolic (congestive) heart failure: Secondary | ICD-10-CM

## 2021-06-16 NOTE — Telephone Encounter (Signed)
Pt called in regard to sores on leg  * 1/19  Pt states that antibiotic has been finished and still has sores on leg   Pt states that sores are red, painful, and feel warm to the touch   Pt would like a call back in regard ,    First available in office appt is 2/2

## 2021-06-16 NOTE — Patient Instructions (Signed)
Visit Information  Thank you for taking time to visit with me today. Please don't hesitate to contact me if I can be of assistance to you before our next scheduled telephone appointment.  Following are the goals we discussed today:  Take medications as prescribed   Attend all scheduled provider appointments Attend church or other social activities Perform all self care activities independently  Perform IADL's (shopping, preparing meals, housekeeping, managing finances) independently Call provider office for new concerns or questions  do ankle pumps when sitting keep legs up while sitting follow rescue plan if symptoms flare-up check blood sugar at prescribed times: once daily check feet daily for cuts, sores or redness take the blood sugar log to all doctor visits take the blood sugar meter to all doctor visits limit fast food meals to no more than 1 per week manage portion size Heart Failure- - Important- call cardiologist office if I gain more than 3 pounds in one day or 5 pounds in one week- follow heart failure action plan, know how you are feeling each day with emphasis on yellow zone - elevate legs when you can - continue to weigh daily and write weight in diary- weigh same time each day on flat, hard surface - follow low salt diet- read labels, avoid salty snacks and gatorade - watch for swelling in feet, ankles and legs every day - take all medications as prescribed - keep in touch with your doctor about weight gain, swelling COPD- - Continue to follow COPD rescue plan, be mindful of how you feel each day with emphasis on yellow zone - continue follow rescue plan if symptoms flare-up, call doctor early on if not feeling well - keep follow-up appointments  - continue to use CPAP as prescribed- call DME company if having any problems with machine - take all medications and use inhalers as prescribed, talk to pharmacist assigned to your case if any questions or concerns -  practice energy conservation, alternate activity with rest  - call RN care manager for any questions at [phone] - continue checking blood sugar daily and recording in log - practice good handwashing and wear a mask as needed- avoid sick people - call primary doctor and make a follow up appointment to have your left leg checked - signs/ symptoms of infection include fever, redness, warmth, pain, swelling, drainage that has an odor  Our next appointment is by telephone on 07/21/21 at 1045 am  Please call the care guide team at [phone] if you need to cancel or reschedule your appointment.   If you are experiencing a Mental Health or [place] or need someone to talk to, please call the Suicide and Crisis Lifeline: 988 call the [place] National Suicide Prevention Lifeline: 442 657 8908 or TTY: 936-412-8908 TTY 2283613139) to talk to a trained counselor call 1-800-273-TALK (toll free, 24 hour hotline) go to [facility] [address], [place] [phone]) call the [place]: [phone] call 911   Patient verbalizes understanding of instructions and care plan provided today and agrees to view in [place]. Active MyChart status confirmed with patient.    Jacqlyn Larsen [facility], BSN RN Case Manager [facility] [phone] Edema Edema is when you have too much fluid in your body or under your skin. Edema may make your legs, feet, and ankles swell. Swelling often happens in looser tissues, such as around your eyes. This is a common condition. It gets more common as you get older. There  are many possible causes of edema. These include: Eating too much salt (sodium). Being on your feet or sitting for a long time. Certain medical conditions, such as: Pregnancy. Heart failure. Liver disease. Kidney disease. Cancer. Hot weather may make edema worse. Edema is usually painless. Your skin may look swollen  or shiny. Follow these instructions at home: Medicines Take over-the-counter and prescription medicines only as told by your doctor. Your doctor may prescribe a medicine to help your body get rid of extra water (diuretic). Take this medicine if you are told to take it. Eating and drinking Eat a low-salt (low-sodium) diet as told by your doctor. Sometimes, eating less salt may reduce swelling. Depending on the cause of your swelling, you may need to limit how much fluid you drink (fluid restriction). General instructions Raise the injured area above the level of your heart while you are sitting or lying down. Do not sit still or stand for a long time. Do not wear tight clothes. Do not wear garters on your upper legs. Exercise your legs. This can help the swelling go down. Wear compression stockings as told by your doctor. It is important that these are the right size. These should be prescribed by your doctor to prevent possible injuries. If elastic bandages or wraps are recommended, use them as told by your doctor. Contact a doctor if: Treatment is not working. You have heart, liver, or kidney disease and have symptoms of edema. You have sudden and unexplained weight gain. Get help right away if: You have shortness of breath or chest pain. You cannot breathe when you lie down. You have pain, redness, or warmth in the swollen areas. You have heart, liver, or kidney disease and get edema all of a sudden. You have a fever and your symptoms get worse all of a sudden. These symptoms may be an emergency. Get help right away. Call 911. Do not wait to see if the symptoms will go away. Do not drive yourself to the hospital. Summary Edema is when you have too much fluid in your body or under your skin. Edema may make your legs, feet, and ankles swell. Swelling often happens in looser tissues, such as around your eyes. Raise the injured area above the level of your heart while you are sitting or  lying down. Follow your doctor's instructions about diet and how much fluid you can drink. This information is not intended to replace advice given to you by your health care provider. Make sure you discuss any questions you have with your health care provider. Document Revised: 01/10/2021 Document Reviewed: 01/10/2021 Elsevier Patient Education  [place].

## 2021-06-16 NOTE — Telephone Encounter (Signed)
Please schedule pt in office appointment for provider to see legs doesn't have to be patel can be any provider that can see the patient also can be put in as work in for early next week

## 2021-06-16 NOTE — Chronic Care Management (AMB) (Signed)
Chronic Care Management   CCM RN Visit Note  06/16/2021 Name: Reginald [name] MRN: [id number] DOB: [birth year]  Subjective: Reginald [name] is a 51 y.o. year old male who is a primary care patient of [name], MD. The care management team was consulted for assistance with disease management and care coordination needs.    Engaged with patient by telephone for follow up visit in response to provider referral for case management and/or care coordination services.   Consent to Services:  The patient was given information about Chronic Care Management services, agreed to services, and gave verbal consent prior to initiation of services.  Please see initial visit note for detailed documentation.   Patient agreed to services and verbal consent obtained.   Assessment: Review of patient past medical history, allergies, medications, health status, including review of consultants reports, laboratory and other test data, was performed as part of comprehensive evaluation and provision of chronic care management services.   SDOH (Social Determinants of Health) assessments and interventions performed:    CCM Care Plan  No Known Allergies  Outpatient Encounter Medications as of 06/16/2021  Medication Sig Note   acetaminophen (TYLENOL) 325 MG tablet Take 650 mg by mouth every 6 (six) hours as needed for mild pain.    albuterol (PROVENTIL) (2.5 MG/3ML) 0.083% nebulizer solution INHALE 1 VIAL VIA NEBULIZER EVERY 6 HOURS AS NEEDED FOR WHEEZING OR SHORTNESS OF BREATH    albuterol (VENTOLIN HFA) 108 (90 Base) MCG/ACT inhaler INHALE 2 PUFFS INTO THE LUNGS EVERY 6 (SIX) HOURS AS NEEDED FOR WHEEZING OR SHORTNESS OF BREATH.    allopurinol (ZYLOPRIM) 300 MG tablet TAKE 1 TABLET EVERY DAY    atorvastatin (LIPITOR) 40 MG tablet TAKE 1 TABLET EVERY DAY    Blood Glucose Monitoring Suppl (TRUE METRIX METER) w/Device KIT USE AS DIRECTED    buPROPion (WELLBUTRIN XL) 300 MG 24 hr tablet Take 1 tablet (300 mg total) by  mouth daily.    Cholecalciferol 125 MCG (5000 UT) TABS Take 1 tablet by mouth daily.    docusate sodium (COLACE) 100 MG capsule Take 2 capsules (200 mg total) by mouth 2 (two) times daily. 02/21/2021: 2 once a day   FEROSUL 325 (65 Fe) MG tablet TAKE 1 TABLET EVERY DAY    fexofenadine (ALLEGRA) 180 MG tablet Take 180 mg by mouth daily.    gabapentin (NEURONTIN) 100 MG capsule TAKE 1 CAPSULE IN THE MORNING AND TAKE 2 CAPSULES IN THE EVENING    glipiZIDE (GLUCOTROL XL) 5 MG 24 hr tablet Take 1 tablet (5 mg total) by mouth daily with breakfast.    hydrOXYzine (ATARAX/VISTARIL) 25 MG tablet Take 1 tablet (25 mg total) by mouth every 8 (eight) hours as needed.    Krill Oil 350 MG CAPS Take 1 capsule by mouth in the morning and at bedtime.     lisinopril (ZESTRIL) 20 MG tablet Take 1 tablet (20 mg total) by mouth daily.    metFORMIN (GLUCOPHAGE) 850 MG tablet TAKE 1 TABLET TWICE DAILY WITH MEALS    metolazone (ZAROXOLYN) 5 MG tablet TAKE 1 TABLET (5 MG TOTAL) BY MOUTH THREE TIMES A WEEK. 04/19/2021: Using as needed of cardiology    metoprolol tartrate (LOPRESSOR) 100 MG tablet TAKE 1 TABLET TWICE DAILY    pantoprazole (PROTONIX) 40 MG tablet TAKE 1 TABLET EVERY DAY    potassium chloride SA (KLOR-CON) 20 MEQ tablet TAKE 3 TABS DAILY AND TAKE AN ADDITIONAL 1 TAB ON THE DAY YOU TAKE YOUR WEEKLY  METOLAZONE DOSE    Semaglutide (RYBELSUS) 14 MG TABS Take 14 mg by mouth in the morning. 03/03/2021: Gets from patient assistance ([employer])   tamsulosin (FLOMAX) 0.4 MG CAPS capsule Take 1 capsule (0.4 mg total) by mouth daily.    Tiotropium Bromide Monohydrate (SPIRIVA RESPIMAT) 2.5 MCG/ACT AERS Inhale 2 puffs into the lungs daily. 03/03/2021: Gets from patient assistance ([employer])   torsemide (DEMADEX) 20 MG tablet Take 2 tablets (40 mg total) by mouth 2 (two) times daily.    TRUE METRIX BLOOD GLUCOSE TEST test strip TEST ONE TIME DAILY FOR DIABETES    TRUEplus Lancets 33G MISC USE TO TEST ONE TIME DAILY FOR  DIABETES    XARELTO 20 MG TABS tablet TAKE 1 TABLET BY MOUTH ONCE EVERY DAY WITH SUPPER    empagliflozin (JARDIANCE) 10 MG TABS tablet Take 1 tablet (10 mg total) by mouth daily before breakfast. (Patient not taking: Reported on 06/09/2021) 05/31/2021: On hold per cardiology   sulfamethoxazole-trimethoprim (BACTRIM DS) 800-160 MG tablet Take 1 tablet by mouth 2 (two) times daily. (Patient not taking: Reported on 06/16/2021)    No facility-administered encounter medications on file as of 06/16/2021.    Patient Active Problem List   Diagnosis Date Noted   Melena 06/13/2021   Wound cellulitis 06/09/2021   SOB (shortness of breath) 06/09/2021   Pain due to onychomycosis of toenails of both feet 05/02/2021   Diabetic neuropathy ([place]) 05/02/2021   Callus of foot 05/02/2021   Sore throat 04/26/2021   BPH with obstruction/lower urinary tract symptoms 04/05/2021   Acute on chronic diastolic CHF (congestive heart failure) ([place]) 03/25/2021   Morbid obesity with BMI of 60.0-69.9, adult ([place]) 03/25/2021   Mixed hyperlipidemia 03/24/2021   Hematuria 03/24/2021   Atrial fibrillation, chronic (HCC) 03/24/2021   Prolonged QT interval 03/24/2021   Iron deficiency anemia 02/21/2021   Constipation 02/21/2021   Rectal bleeding    Chronic blood loss anemia 11/12/2020   Gastrointestinal hemorrhage 10/08/2020   S/P placement of cardiac pacemaker 02/10/2020   Encounter for examination following treatment at hospital 02/10/2020   Anxiety 02/10/2020   DM2 (diabetes mellitus, type 2) ([place]) 02/10/2020   OSA on CPAP    History of pulmonary embolism 03/16/2019   Paroxysmal atrial fibrillation ([place]) 09/05/2018   COPD (chronic obstructive pulmonary disease) ([place]) 05/07/2018   Gastroesophageal reflux disease    Pulmonary embolism ([place]) 09/18/2017   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 08/23/2017   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   Pulmonary  hypertension ([place])    Chronic periodontitis 07/18/2017   Morbid obesity ([place])    Essential hypertension    Tobacco abuse    Chronic heart failure with preserved ejection fraction (HFpEF) ([place])    Chronic venous insufficiency     Conditions to be addressed/monitored:CHF and COPD  Care Plan : RN Care Manager plan of care  Updates made by Kassie Mends, RN since 06/16/2021 12:00 AM     Problem: No plan of care established for management of chronic disease states (CHF, COPD)   Priority: High     Long-Range Goal: Development of plan of care for chronic disease management (CHF, COPD)   Start Date: 04/05/2021  Expected End Date: 11/06/2021  Priority: High  Note:   Current Barriers:  Knowledge Deficits related to plan of care for management of CHF and COPD  Knowledge deficit related to basic heart failure pathophysiology and self care management- needs reinforcement for HF  action plan, symptom management Does not adhere to provider recommendations re: low sodium diet, sometimes eats fast food, drinks gatorade although trying to cut down, does not exercise, feels he is not able, stays tired, pt reports he checks CBG once daily with most recent AIC 5.8, CBG ranges 150-160's per pt. Checks blood pressure on occasion, denies any GI bleeding, melena or constipation today. Patient does continue to weigh daily with weight today 487 pounds. Patient continues working with pharmacist for any medication related needs.  Pt reports he has all medications and taking as prescribed, reports had hematuria which has now resolved and continues following up with urologist Dr. Felipa Eth, had cystoscopy and states " it was normal"  Pt reports he will be having surgery on his nose in the near future (nasal septoplasty with turbinate reduction), saw primary care provider on 06/09/21 due to "sore on my leg that's now scabbed over"  pt reports he if finished with antibiotic but states " it's still red, warm and causes me a  lot of pain"  pt reports he will call primary care provider and make a follow up appointment to have his leg checked.   Lacks social connections- pt lives alone, has a friend he can call if needed, does not communicate much with his family, gets out and drives to the grocery store and flea market to talk with friends Knowledge deficits related to basic understanding of COPD disease process- needs reinforcement of COPD action plan, symptom management, pt uses CPAP, checks 02 sat as needed and had CPAP machine serviced/recalibrated at [facility] in [place], no issues with CPAP at present. 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  RNCM Clinical Goal(s):  Patient will verbalize understanding of plan for management of CHF and COPD as evidenced by pt report, review EHR take all medications exactly as prescribed and will call provider for medication related questions as evidenced by pt report, review EHR    attend all scheduled medical appointments: , 07/06/21 CCM pharmacist, 07/01/21 Dr. Elsworth Soho, 08/12/21 primary care provider, 07/27/21 Bernerd Pho  as evidenced by pt report, review EHR         and through collaboration with RN Care manager, provider, and care team.   Interventions: 1:1 collaboration with primary care provider regarding development and update of comprehensive plan of care as evidenced by provider attestation and co-signature Inter-disciplinary care team collaboration (see longitudinal plan of care) Evaluation of current  treatment plan related to  self management and patient's adherence to plan as established by provider  COPD Interventions:   Advised patient to self assesses COPD action plan zone and make appointment with provider if in the yellow zone for 48 hours without improvement Provided education about and advised patient to utilize infection prevention strategies to reduce risk of respiratory infection Reinforced importance of good handwashing and wearing a mask as needed Reinforced importance of wearing CPAP as prescribed and calling DME company for any issues Reviewed energy conservation- alternate activity with rest Pain assessment completed Ask patient to call primary care provider and make follow up appointment  to have his leg checked In basket message sent to primary care provider reporting pt has finished antibiotics, still has warmth and pain to left leg with wound scabbed over  Heart Failure Interventions: Discussed importance of daily weight and advised patient to weigh and record daily Reviewed role of diuretics in prevention of fluid overload and management of heart failure; Discussed the importance of keeping all appointments with provider  Encouraged low sodium diet Reviewed HF action plan and importance of calling doctor early on for change in health status, symptoms, emphasis on yellow zone Encouraged patient to continue getting outdoors daily and trying to stay active as possible Reviewed all upcoming scheduled appointments Pain assessment completed Reviewed CBG readings with patient Reviewed importance of continuing mental health counseling  Patient Goals/Self-Care Activities: Take medications as prescribed   Attend all scheduled provider appointments Attend church or other social activities Perform all self care activities independently  Perform IADL's (shopping, preparing meals, housekeeping, managing finances) independently Call provider office for new concerns or questions   do ankle pumps when sitting keep legs up while sitting follow rescue plan if symptoms flare-up check blood sugar at prescribed times: once daily check feet daily for cuts, sores or redness take the blood sugar log to all doctor visits take the blood sugar meter to all doctor visits limit fast food meals to no more than 1 per week manage portion size Heart Failure- - Important- call cardiologist office if I gain more than 3 pounds in one day or 5 pounds in one week- follow heart failure action plan, know how you are feeling each day with emphasis on yellow zone - elevate legs when you can - continue to weigh daily and write weight in diary- weigh same time each day on flat, hard surface - follow low salt diet- read labels, avoid salty snacks and gatorade - watch for swelling in feet, ankles and legs every day - take all medications as prescribed - keep in touch with your doctor about weight gain, swelling COPD- - Continue to follow COPD rescue plan, be mindful of how you feel each day with emphasis on yellow zone - continue follow rescue plan if symptoms flare-up, call doctor early on if not feeling well - keep follow-up appointments  - continue to use CPAP as prescribed- call DME company if having any problems with machine - take all medications and use inhalers as prescribed, talk to pharmacist assigned to your case if any questions or concerns - practice energy conservation, alternate activity with rest  - call RN care manager for any questions at [phone] - continue checking blood sugar daily and recording in log - practice good handwashing and wear a mask as needed- avoid sick people - call primary doctor and make a follow up appointment to have your left leg checked - signs/ symptoms of infection include fever, redness, warmth, pain, swelling, drainage that has an odor   Follow Up Plan:  Telephone follow up appointment with care management team member scheduled for:   07/21/2021       Plan:Telephone follow up appointment with care management team member scheduled for:  07/21/21  Jacqlyn Larsen [facility], BSN RN Case Manager [facility] [phone]

## 2021-06-17 ENCOUNTER — Ambulatory Visit (INDEPENDENT_AMBULATORY_CARE_PROVIDER_SITE_OTHER): Payer: Medicare HMO | Admitting: Family Medicine

## 2021-06-17 ENCOUNTER — Other Ambulatory Visit: Payer: Self-pay

## 2021-06-17 ENCOUNTER — Ambulatory Visit (HOSPITAL_COMMUNITY): Payer: Medicare HMO | Attending: Family Medicine | Admitting: Physical Therapy

## 2021-06-17 ENCOUNTER — Encounter: Payer: Self-pay | Admitting: Family Medicine

## 2021-06-17 VITALS — BP 110/78 | HR 92 | Ht 71.0 in | Wt >= 6400 oz

## 2021-06-17 DIAGNOSIS — L0390 Cellulitis, unspecified: Secondary | ICD-10-CM | POA: Diagnosis not present

## 2021-06-17 DIAGNOSIS — I878 Other specified disorders of veins: Secondary | ICD-10-CM | POA: Insufficient documentation

## 2021-06-17 DIAGNOSIS — Z6841 Body Mass Index (BMI) 40.0 and over, adult: Secondary | ICD-10-CM

## 2021-06-17 DIAGNOSIS — R600 Localized edema: Secondary | ICD-10-CM | POA: Diagnosis not present

## 2021-06-17 DIAGNOSIS — S81802S Unspecified open wound, left lower leg, sequela: Secondary | ICD-10-CM | POA: Diagnosis not present

## 2021-06-17 MED ORDER — SULFAMETHOXAZOLE-TRIMETHOPRIM 800-160 MG PO TABS: 1.0000 | ORAL_TABLET | Freq: Two times a day (BID) | ORAL | 0 refills | Status: DC

## 2021-06-17 NOTE — Telephone Encounter (Signed)
Appt made

## 2021-06-17 NOTE — Assessment & Plan Note (Signed)
Erythema of distal 1/3 of leg and scab on lLE max diameter approx 6 cm, additional 10 day antibiotic course and refer to wound care (PT)

## 2021-06-17 NOTE — Patient Instructions (Signed)
F/U with Dr Gerilyn Nestle, as before, call if you need to be seen sooner  Additional course of septra is prescribed.  You are referred to for wound care pleaSE KEEP ALL APPOINTMENTS  Thanks for choosing [facility], we consider it a privelige to serve you.

## 2021-06-17 NOTE — Therapy (Signed)
[place], [place], [address] Phone: [phone]   Fax:  [phone]  Wound Care Evaluation  Patient Details  Name: Reginald [name] MRN: [id number] Date of Birth: 16-Nov-1970 Referring Provider (PT): Tula Nakayama   Encounter Date: 06/17/2021   PT End of Session - 06/17/21 1533     Visit Number 1    Number of Visits 8    Date for PT Re-Evaluation 07/17/21    Authorization Type [employer]    Authorization - Visit Number 1    Authorization - Number of Visits 8    Progress Note Due on Visit 8    PT Start Time 1405    PT Stop Time 6962    PT Time Calculation (min) 40 min    Activity Tolerance Patient tolerated treatment well    Behavior During Therapy [facility] for tasks assessed/performed             Past Medical History:  Diagnosis Date   Anxiety    Aortic stenosis    Arthritis    Asthma    Back pain    Cellulitis of skin with lymphangitis    CHF (congestive heart failure) (HCC)    Chronic diastolic congestive heart failure (HCC)    Chronic venous insufficiency    Constipation    COPD (chronic obstructive pulmonary disease) ([place])    Depression    Diabetes ([place])    Dyspnea    Essential hypertension    Gout    Heart murmur    Hypertension    Morbid obesity ([place])    Obesity    Pneumonia    walking pneumonia   Prostatitis    Pulmonary embolism ([place])    Pulmonary hypertension ([place])    Pyelonephritis    S/P aortic valve replacement with bioprosthetic valve 08/23/2017   25 mm Edwards Inspiris Resilia stented bovine pericardial tissue valve   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   24 mm Hemashield supracoronary straight graft    Sleep apnea    cpap    Thoracic ascending aortic aneurysm    Thoracic ascending aortic aneurysm    Tobacco abuse     Past Surgical History:  Procedure Laterality Date   AORTIC VALVE REPLACEMENT N/A 08/23/2017   Procedure: AORTIC VALVE REPLACEMENT (AVR) USING  INSPIRIS RESILIA AORTIC VALVE SIZE 25 MM;  Surgeon: Rexene Alberts, MD;  Location: [place];  Service: Open Heart Surgery;  Laterality: N/A;   CARDIAC VALVE REPLACEMENT N/A    Phreesia 02/07/2020   COLONOSCOPY WITH PROPOFOL N/A 11/14/2020   Procedure: COLONOSCOPY WITH PROPOFOL;  Surgeon: Gatha Mayer, MD;  Location: [facility] ENDOSCOPY;  Service: Endoscopy;  Laterality: N/A;   ESOPHAGOGASTRODUODENOSCOPY (EGD) WITH PROPOFOL N/A 11/14/2020   Procedure: ESOPHAGOGASTRODUODENOSCOPY (EGD) WITH PROPOFOL;  Surgeon: Gatha Mayer, MD;  Location: [place];  Service: Endoscopy;  Laterality: N/A;   MULTIPLE EXTRACTIONS WITH ALVEOLOPLASTY N/A 07/23/2017   Procedure: Extraction of tooth #10 with alveoloplasty and gross debridement of remaining teeth;  Surgeon: Lenn Cal, DDS;  Location: [place];  Service: Oral Surgery;  Laterality: N/A;   PACEMAKER IMPLANT N/A 08/28/2017    St Jude Medical Assurity MRI conditional  dual-chamber pacemaker for symptomatic complete heart blockby Dr Rayann Heman   TEE WITHOUT CARDIOVERSION N/A 07/16/2017   Procedure: TRANSESOPHAGEAL ECHOCARDIOGRAM (TEE);  Surgeon: Lelon Perla, MD;  Location: [facility] ENDOSCOPY;  Service: Cardiovascular;  Laterality: N/A;   TEE WITHOUT CARDIOVERSION N/A 08/23/2017   Procedure: TRANSESOPHAGEAL  ECHOCARDIOGRAM (TEE);  Surgeon: Rexene Alberts, MD;  Location: [place];  Service: Open Heart Surgery;  Laterality: N/A;   THORACIC AORTIC ANEURYSM REPAIR N/A 08/23/2017   Procedure: THORACIC ASCENDING ANEURYSM REPAIR (AAA) USING HEMASHIELD GOLD KNITTED MICROVEL DOUBLE VELOUR VASCULAR GRAFT D: 24 MM  L: 30 CM;  Surgeon: Rexene Alberts, MD;  Location: [place];  Service: Open Heart Surgery;  Laterality: N/A;    There were no vitals filed for this visit.     [facility] PT Assessment - 06/17/21 0001       Assessment   Medical Diagnosis Left LE Open wound with cellulitis    Referring Provider (PT) Tula Nakayama    Onset Date/Surgical Date --   chronic   Next  MD Visit [email or web address]    Prior Therapy none      Precautions   Precautions Other (comment)    Precaution Comments cellulitis      Balance Screen   Has the patient fallen in the past 6 months No    Has the patient had a decrease in activity level because of a fear of falling?  Yes    Is the patient reluctant to leave their home because of a fear of falling?  No      Prior Function   Level of Independence Independent with community mobility with device      Cognition   Overall Cognitive Status Within Functional Limits for tasks assessed             Wound Therapy - 06/17/21 1354     Subjective Pt states that he use to have compression garments but now his legs are so big he can not get them on.  He has also gained wt and is unable to bend down to reach his legs to get them on.   The pt states his leg began to swell more than usual about four weeks ago.  He went to itch his leg and the skin came off and he has been trying to heal his leg ever since.  He is on his second round of antibiotics.  The pt states the pain varies depending on his activity level.    Patient and Family Stated Goals Wound healed and leg swelling to be down    Date of Onset 05/20/21    Prior Treatments self care, antibiotics.    Pain Score 5     Pain Location Leg    Pain Orientation Left    Pain Descriptors / Indicators Aching    Pain Onset On-going    Patients Stated Pain Goal 0    Pain Intervention(s) Medication (See eMAR)    Multiple Pain Sites No    Evaluation and Treatment Procedures Explained to Patient/Family Yes    Evaluation and Treatment Procedures agreed to    Wound Properties Date First Assessed: 06/17/21 Time First Assessed: 1410 Location: Leg Location Orientation: Left Present on Admission: Yes   Wound Image Images linked: 1    Dressing Type None    Dressing Change Frequency PRN    Site / Wound Assessment Black;Dry    % Wound base Red or Granulating 0%    % Wound base Black/Eschar 100%     Peri-wound Assessment Edema;Erythema (blanchable)    Wound Length (cm) 1.7 cm    Wound Width (cm) 1.2 cm    Wound Surface Area (cm^2) 2.04 cm^2    Drainage Amount None    Treatment Cleansed;Debridement (Selective);Other (Comment)   manual to decrease  swelling   Selective Debridement - Location wound bed    Selective Debridement - Tools Used Forceps;Scissors    Selective Debridement - Tissue Removed devitalized tissue.    Wound Therapy - Clinical Statement Reginald [name] is a 51 yo male who has B edema in his LE he has been dx with cellulitis with lymphangitis in the past but has never formally been diagnosed with lymphedema.  He comes to the department as a stat wound evaluation.  Evaluation demonstrates increased edema, increased erythema, increased pain, and a non-healing wound on the Lt LE.  Reginald [name] hx is complicated with an aortic vaolve replacment with pacemaker, DM, CHF, CVI, COPD and morbid obesity.  Reginald [name] will benefit from skilled PT to create a healing environment for his wound, decrease the edema and educate pt on keeping leg swelling to a minimal.  Therapist will send an order our for both juxtafit and a compression pump.    Wound Therapy - Functional Problem List difficult to bathe, dress, walk    Factors Delaying/Impairing Wound Healing Diabetes Mellitus;Altered sensation;Infection - systemic/local;Immobility;Multiple medical problems;Polypharmacy;Vascular compromise    Hydrotherapy Plan Debridement;Dressing change;Patient/family education;Other (comment)   manual to decrease edema   Wound Therapy - Frequency 2X / week    Wound Therapy - Current Recommendations PT   4 weeks.  Discuss if pt is interested in lymphedema treatments   Wound Plan cleanse, moisturize, debridement, manual and compression bandaging    Dressing  xeroform f/b 2x2 to wound ; profore compression bandaging to LE               LYMPHEDEMA/ONCOLOGY QUESTIONNAIRE - 06/17/21 0001       What other symptoms do  you have   Are you Having Heaviness or Tightness Yes    Are you having Pain Yes    Are you having pitting edema Yes    Body Site legs    Is it Hard or Difficult finding clothes that fit Yes    Do you have infections Yes    Comments cellulitis of Lt LE pt is on antibiotics      Lymphedema Stage   Stage STAGE 2 SPONTANEOUSLY IRREVERSIBLE      Lymphedema Assessments   Lymphedema Assessments Lower extremities      Right Lower Extremity Lymphedema   30 cm Proximal to Floor at Lateral Plantar Foot 58.4 cm    20 cm Proximal to Floor at Lateral Plantar Foot 51 1    10 cm Proximal to Floor at Lateral Malleoli 33.5 cm      Left Lower Extremity Lymphedema   30 cm Proximal to Floor at Lateral Plantar Foot 63 cm    20 cm Proximal to Floor at Lateral Plantar Foot 56.5 cm    10 cm Proximal to Floor at Lateral Malleoli 35 cm                Objective measurements completed on examination: See above findings.             PT Education - 06/17/21 1532     Education Details The importance of keeping LE's cleansed, moisturized and keeping the swelling down as much as possible.    Person(s) Educated Patient    Methods Explanation    Comprehension Verbalized understanding              PT Short Term Goals - 06/17/21 1541       PT SHORT TERM GOAL #1   Title Pt wound to  be 100% granulated and no larger than .7x .7    Time 2    Period Weeks    Status New    Target Date 07/01/21      PT SHORT TERM GOAL #2   Title PT to have lost 2 cm from 30and 20 cm measurements to create a wound healing environment.    Time 2    Period Weeks    Status New               PT Long Term Goals - 06/17/21 1542       PT LONG TERM GOAL #1   Title PT wound on his LT LE to be healed    Time 4    Period Weeks    Status New    Target Date 07/15/21      PT LONG TERM GOAL #2   Title PT to have and be using a compression pump    Time 4    Period Weeks    Status New      PT LONG  TERM GOAL #4   Title PT to have and be wearing juxtfit to control edema in LE to prevent future wounds.    Time 4    Period Weeks    Status New                  Plan - 06/17/21 1534     Clinical Impression Statement see above    Personal Factors and Comorbidities Comorbidity 3+;Finances;Fitness;Time since onset of injury/illness/exacerbation    Comorbidities CHF, CVI, COPD, DM, morbid obesity, aortic valve replacement with pacemaker    Examination-Activity Limitations Bathing;Dressing;Hygiene/Grooming;Locomotion Level;Stand    Examination-Participation Restrictions Community Activity;Shop;Meal Prep;Other    Stability/Clinical Decision Making Evolving/Moderate complexity    Clinical Decision Making Moderate    Rehab Potential Good    PT Frequency 2x / week    PT Duration 4 weeks    PT Treatment/Interventions Patient/family education;Other (comment);Manual techniques   debridement and dressing change   PT Next Visit Plan assess comfort of profore dressing, begin manual, see if pt would like to be seen for lymphedema treatment.             Patient will benefit from skilled therapeutic intervention in order to improve the following deficits and impairments:  Obesity, Pain, Decreased skin integrity, Increased edema  Visit Diagnosis: Localized edema  Leg wound, left, sequela  Chronic venous stasis    Problem List Patient Active Problem List   Diagnosis Date Noted   Melena 06/13/2021   Wound cellulitis 06/09/2021   SOB (shortness of breath) 06/09/2021   Pain due to onychomycosis of toenails of both feet 05/02/2021   Diabetic neuropathy ([place]) 05/02/2021   Callus of foot 05/02/2021   Sore throat 04/26/2021   BPH with obstruction/lower urinary tract symptoms 04/05/2021   Acute on chronic diastolic CHF (congestive heart failure) ([place]) 03/25/2021   Morbid obesity with BMI of 60.0-69.9, adult ([place]) 03/25/2021   Mixed hyperlipidemia 03/24/2021   Hematuria 03/24/2021    Atrial fibrillation, chronic (HCC) 03/24/2021   Prolonged QT interval 03/24/2021   Iron deficiency anemia 02/21/2021   Constipation 02/21/2021   Rectal bleeding    Chronic blood loss anemia 11/12/2020   Gastrointestinal hemorrhage 10/08/2020   S/P placement of cardiac pacemaker 02/10/2020   Encounter for examination following treatment at hospital 02/10/2020   Anxiety 02/10/2020   DM2 (diabetes mellitus, type 2) ([place]) 02/10/2020   OSA on CPAP  History of pulmonary embolism 03/16/2019   Paroxysmal atrial fibrillation ([place]) 09/05/2018   COPD (chronic obstructive pulmonary disease) ([place]) 05/07/2018   Gastroesophageal reflux disease    Pulmonary embolism ([place]) 09/18/2017   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 08/23/2017   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   Pulmonary hypertension (HCC)    Chronic periodontitis 07/18/2017   Morbid obesity ([place])    Essential hypertension    Tobacco abuse    Chronic heart failure with preserved ejection fraction (HFpEF) ([place])    Chronic venous insufficiency    Rayetta Humphrey, PT CLT [phone]  06/17/2021, 3:47 PM  [facility] [address] [place], [place], [address] Phone: [phone]   Fax:  [phone]  Name: Reginald [name] MRN: [id number] Date of Birth: 31-Mar-1971

## 2021-06-20 ENCOUNTER — Ambulatory Visit (INDEPENDENT_AMBULATORY_CARE_PROVIDER_SITE_OTHER): Payer: Medicare HMO | Admitting: Gastroenterology

## 2021-06-20 ENCOUNTER — Ambulatory Visit (HOSPITAL_COMMUNITY): Payer: Medicare HMO | Admitting: Physical Therapy

## 2021-06-20 ENCOUNTER — Other Ambulatory Visit: Payer: Self-pay

## 2021-06-20 ENCOUNTER — Encounter: Payer: Self-pay | Admitting: Family Medicine

## 2021-06-20 DIAGNOSIS — R600 Localized edema: Secondary | ICD-10-CM

## 2021-06-20 DIAGNOSIS — S81802S Unspecified open wound, left lower leg, sequela: Secondary | ICD-10-CM | POA: Diagnosis not present

## 2021-06-20 DIAGNOSIS — I878 Other specified disorders of veins: Secondary | ICD-10-CM | POA: Diagnosis not present

## 2021-06-20 DIAGNOSIS — L0390 Cellulitis, unspecified: Secondary | ICD-10-CM | POA: Diagnosis not present

## 2021-06-20 NOTE — Therapy (Signed)
[place], [place], [address] Phone: [phone]   Fax:  [phone]  Wound Care Therapy  Patient Details  Name: Reginald [name] MRN: [id number] Date of Birth: 1971-03-19 Referring Provider (PT): Tula Nakayama   Encounter Date: 06/20/2021   PT End of Session - 06/20/21 1518     Visit Number 2    Number of Visits 8    Date for PT Re-Evaluation 07/17/21    Authorization Type [employer]    Authorization - Visit Number 2    Authorization - Number of Visits 8    Progress Note Due on Visit 8    PT Start Time 7322    PT Stop Time 0254    PT Time Calculation (min) 34 min    Activity Tolerance Patient tolerated treatment well    Behavior During Therapy [facility] for tasks assessed/performed             Past Medical History:  Diagnosis Date   Anxiety    Aortic stenosis    Arthritis    Asthma    Back pain    Cellulitis of skin with lymphangitis    CHF (congestive heart failure) (HCC)    Chronic diastolic congestive heart failure (HCC)    Chronic venous insufficiency    Constipation    COPD (chronic obstructive pulmonary disease) ([place])    Depression    Diabetes ([place])    Dyspnea    Essential hypertension    Gout    Heart murmur    Hypertension    Morbid obesity ([place])    Obesity    Pneumonia    walking pneumonia   Prostatitis    Pulmonary embolism ([place])    Pulmonary hypertension ([place])    Pyelonephritis    S/P aortic valve replacement with bioprosthetic valve 08/23/2017   25 mm Edwards Inspiris Resilia stented bovine pericardial tissue valve   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   24 mm Hemashield supracoronary straight graft    Sleep apnea    cpap    Thoracic ascending aortic aneurysm    Thoracic ascending aortic aneurysm    Tobacco abuse     Past Surgical History:  Procedure Laterality Date   AORTIC VALVE REPLACEMENT N/A 08/23/2017   Procedure: AORTIC VALVE REPLACEMENT (AVR) USING INSPIRIS  RESILIA AORTIC VALVE SIZE 25 MM;  Surgeon: Rexene Alberts, MD;  Location: [place];  Service: Open Heart Surgery;  Laterality: N/A;   CARDIAC VALVE REPLACEMENT N/A    Phreesia 02/07/2020   COLONOSCOPY WITH PROPOFOL N/A 11/14/2020   Procedure: COLONOSCOPY WITH PROPOFOL;  Surgeon: Gatha Mayer, MD;  Location: [facility] ENDOSCOPY;  Service: Endoscopy;  Laterality: N/A;   ESOPHAGOGASTRODUODENOSCOPY (EGD) WITH PROPOFOL N/A 11/14/2020   Procedure: ESOPHAGOGASTRODUODENOSCOPY (EGD) WITH PROPOFOL;  Surgeon: Gatha Mayer, MD;  Location: [place];  Service: Endoscopy;  Laterality: N/A;   MULTIPLE EXTRACTIONS WITH ALVEOLOPLASTY N/A 07/23/2017   Procedure: Extraction of tooth #10 with alveoloplasty and gross debridement of remaining teeth;  Surgeon: Lenn Cal, DDS;  Location: [place];  Service: Oral Surgery;  Laterality: N/A;   PACEMAKER IMPLANT N/A 08/28/2017    St Jude Medical Assurity MRI conditional  dual-chamber pacemaker for symptomatic complete heart blockby Dr Rayann Heman   TEE WITHOUT CARDIOVERSION N/A 07/16/2017   Procedure: TRANSESOPHAGEAL ECHOCARDIOGRAM (TEE);  Surgeon: Lelon Perla, MD;  Location: [facility] ENDOSCOPY;  Service: Cardiovascular;  Laterality: N/A;   TEE WITHOUT CARDIOVERSION N/A 08/23/2017   Procedure: TRANSESOPHAGEAL  ECHOCARDIOGRAM (TEE);  Surgeon: Rexene Alberts, MD;  Location: [place];  Service: Open Heart Surgery;  Laterality: N/A;   THORACIC AORTIC ANEURYSM REPAIR N/A 08/23/2017   Procedure: THORACIC ASCENDING ANEURYSM REPAIR (AAA) USING HEMASHIELD GOLD KNITTED MICROVEL DOUBLE VELOUR VASCULAR GRAFT D: 24 MM  L: 30 CM;  Surgeon: Rexene Alberts, MD;  Location: [place];  Service: Open Heart Surgery;  Laterality: N/A;    There were no vitals filed for this visit.    Subjective Assessment - 06/20/21 1506     Subjective pt states its really tender because it's slid down.                       Wound Therapy - 06/20/21 1507     Subjective pt states it is really  tender where the dressing has slid down and is bunched up around his ankle.    Patient and Family Stated Goals Wound healed and leg swelling to be down    Date of Onset 05/20/21    Prior Treatments self care, antibiotics.    Evaluation and Treatment Procedures Explained to Patient/Family Yes    Evaluation and Treatment Procedures agreed to    Wound Properties Date First Assessed: 06/17/21 Time First Assessed: 1410 Location: Leg Location Orientation: Left Present on Admission: Yes   Dressing Type Compression wrap;Impregnated gauze (bismuth)    Dressing Changed Changed    Dressing Status Old drainage    Dressing Change Frequency PRN    Site / Wound Assessment Pink;Yellow    % Wound base Red or Granulating 15%    % Wound base Yellow/Fibrinous Exudate 85%    Peri-wound Assessment Erythema (blanchable);Induration;Maceration    Margins Unattached edges (unapproximated)    Drainage Amount Minimal    Drainage Description Serosanguineous    Treatment Cleansed;Debridement (Selective)    Selective Debridement - Location wound bed    Selective Debridement - Tools Used Forceps;Scissors    Selective Debridement - Tissue Removed devitalized tissue.    Wound Therapy - Clinical Statement Dressing bunched around ankle with resultant ringlets and redness from bottle neck caused by bandaging.  Pt tender when cleansing around wound.  Pt with 100% slough upon dressing removal but able to debride borders to pain tolerance removing 15% of slough.  LE moisturized well and continued with xeroform.   inch foam cut for distal LE to help improve shape of LE and reduce tendency of dressing to slide down with decompression.  Discussed Lymphedema with patient and instructed him to look it up and read about it.  Pt verbalized he would do so.    Wound Therapy - Functional Problem List difficult to bathe, dress, walk    Factors Delaying/Impairing Wound Healing Diabetes Mellitus;Altered sensation;Infection -  systemic/local;Immobility;Multiple medical problems;Polypharmacy;Vascular compromise    Hydrotherapy Plan Debridement;Dressing change;Patient/family education;Other (comment)   manual to decrease edema   Wound Therapy - Frequency 2X / week    Wound Therapy - Current Recommendations PT   4 weeks.  Discuss if pt is interested in lymphedema treatments   Wound Plan cleanse, moisturize, debridement, manual and compression bandaging    Dressing  xeroform, 4X4, 1/2 netting over #9 lymph netting, profore compression bandaging to LE                       PT Short Term Goals - 06/17/21 1541       PT SHORT TERM GOAL #1   Title Pt wound to be  100% granulated and no larger than .7x .7    Time 2    Period Weeks    Status New    Target Date 07/01/21      PT SHORT TERM GOAL #2   Title PT to have lost 2 cm from 30and 20 cm measurements to create a wound healing environment.    Time 2    Period Weeks    Status New               PT Long Term Goals - 06/17/21 1542       PT LONG TERM GOAL #1   Title PT wound on his LT LE to be healed    Time 4    Period Weeks    Status New    Target Date 07/15/21      PT LONG TERM GOAL #2   Title PT to have and be using a compression pump    Time 4    Period Weeks    Status New      PT LONG TERM GOAL #4   Title PT to have and be wearing juxtfit to control edema in LE to prevent future wounds.    Time 4    Period Weeks    Status New                    Patient will benefit from skilled therapeutic intervention in order to improve the following deficits and impairments:     Visit Diagnosis: Localized edema  Leg wound, left, sequela  Chronic venous stasis     Problem List Patient Active Problem List   Diagnosis Date Noted   Melena 06/13/2021   Wound cellulitis 06/09/2021   SOB (shortness of breath) 06/09/2021   Pain due to onychomycosis of toenails of both feet 05/02/2021   Diabetic neuropathy ([place]) 05/02/2021    Callus of foot 05/02/2021   BPH with obstruction/lower urinary tract symptoms 04/05/2021   Acute on chronic diastolic CHF (congestive heart failure) ([place]) 03/25/2021   Morbid obesity with BMI of 60.0-69.9, adult ([place]) 03/25/2021   Mixed hyperlipidemia 03/24/2021   Hematuria 03/24/2021   Atrial fibrillation, chronic (HCC) 03/24/2021   Prolonged QT interval 03/24/2021   Iron deficiency anemia 02/21/2021   Constipation 02/21/2021   Rectal bleeding    Chronic blood loss anemia 11/12/2020   Gastrointestinal hemorrhage 10/08/2020   S/P placement of cardiac pacemaker 02/10/2020   Encounter for examination following treatment at hospital 02/10/2020   Anxiety 02/10/2020   DM2 (diabetes mellitus, type 2) ([place]) 02/10/2020   OSA on CPAP    History of pulmonary embolism 03/16/2019   Paroxysmal atrial fibrillation ([place]) 09/05/2018   COPD (chronic obstructive pulmonary disease) ([place]) 05/07/2018   Gastroesophageal reflux disease    Pulmonary embolism ([place]) 09/18/2017   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 08/23/2017   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   Pulmonary hypertension ([place])    Chronic periodontitis 07/18/2017   Morbid obesity ([place])    Essential hypertension    Tobacco abuse    Chronic heart failure with preserved ejection fraction (HFpEF) ([place])    Chronic venous insufficiency    Teena Irani, PTA/CLT, WTA [phone]  Teena Irani, PTA 06/20/2021, 3:20 PM  [facility] [address] [place], [place], [address] Phone: [phone]   Fax:  [phone]  Name: COLE EASTRIDGE MRN: [id number] Date of Birth: 18-Oct-1970

## 2021-06-20 NOTE — Progress Notes (Signed)
° °  Reginald [name]     MRN: [id number]      DOB: 05-11-1971   HPI Reginald [name] is here with c/o wound on left leg for which he has been on antibiotic course since 06/09/2021 getting worse.  He is diabetic and has limited mobility He denies fevr, chills or drainage form the wound. C/o dark scab not falling off, and redness of surrounding skin which is actually clearing ROS Denies recent fever or chills.  Denies sinus pressure, nasal congestion, ear pain or sore throat. Denies chest congestion, productive cough or wheezing. Denies chest pains, palpitations and leg swelling Denies abdominal pain, nausea, vomiting,diarrhea or constipation.   Denies dysuria, frequency, hesitancy or incontinence. Denies uncontrolled depression, anxiety or insomnia.  PE  BP 110/78    Pulse 92    Ht 5\' 11"  (1.803 m)    Wt (!) 481 lb 1.9 oz (218.2 kg)    SpO2 94%    BMI 67.10 kg/m   Patient alert and oriented and in no cardiopulmonary distress.  HEENT: No facial asymmetry, EOMI,     Neck adequate ROM .  Chest: Clear to auscultation bilaterally.Decreased air entry  CVS: S1, S2 murmurs, no S3.Regular rate.  ABD: Soft non tender.   Ext: trace  edema  MS: decreased  ROM spine, shoulders, hips and knees.  Skin: healing left leg ulcer, eschar present, max diameter approx 6 cm, erythema of surrounding skin.  Psych: Good eye contact, normal affect. Memory intact not anxious or depressed appearing.  CNS: CN 2-12 intact, power,  normal throughout.no focal deficits noted.   Assessment & Plan  Wound cellulitis Erythema of distal 1/3 of leg and scab on lLE max diameter approx 6 cm, additional 10 day antibiotic course and refer to wound care (PT)  Morbid obesity with BMI of 60.0-69.9, adult [facility])  Patient re-educated about  the importance of commitment to a  minimum of 150 minutes of exercise per week as able.  The importance of healthy food choices with portion control discussed, as well as eating regularly  and within a 12 hour window most days. The need to choose "clean , green" food 50 to 75% of the time is discussed, as well as to make water the primary drink and set a goal of 64 ounces water daily.    Weight /BMI 06/17/2021 06/13/2021 06/09/2021  WEIGHT 481 lb 1.9 oz 497 lb 8 oz 499 lb 1.3 oz  HEIGHT 5\' 11"  5\' 11"  5\' 11"   BMI 67.1 kg/m2 69.39 kg/m2 69.61 kg/m2

## 2021-06-20 NOTE — Assessment & Plan Note (Signed)
°  Patient re-educated about  the importance of commitment to a  minimum of 150 minutes of exercise per week as able.  The importance of healthy food choices with portion control discussed, as well as eating regularly and within a 12 hour window most days. The need to choose "clean , green" food 50 to 75% of the time is discussed, as well as to make water the primary drink and set a goal of 64 ounces water daily.    Weight /BMI 06/17/2021 06/13/2021 06/09/2021  WEIGHT 481 lb 1.9 oz 497 lb 8 oz 499 lb 1.3 oz  HEIGHT 5\' 11"  5\' 11"  5\' 11"   BMI 67.1 kg/m2 69.39 kg/m2 69.61 kg/m2

## 2021-06-21 ENCOUNTER — Encounter (INDEPENDENT_AMBULATORY_CARE_PROVIDER_SITE_OTHER): Payer: Self-pay

## 2021-06-21 DIAGNOSIS — I10 Essential (primary) hypertension: Secondary | ICD-10-CM

## 2021-06-21 DIAGNOSIS — J449 Chronic obstructive pulmonary disease, unspecified: Secondary | ICD-10-CM

## 2021-06-21 DIAGNOSIS — E1169 Type 2 diabetes mellitus with other specified complication: Secondary | ICD-10-CM

## 2021-06-21 DIAGNOSIS — F339 Major depressive disorder, recurrent, unspecified: Secondary | ICD-10-CM | POA: Diagnosis not present

## 2021-06-21 DIAGNOSIS — I5032 Chronic diastolic (congestive) heart failure: Secondary | ICD-10-CM

## 2021-06-21 DIAGNOSIS — I480 Paroxysmal atrial fibrillation: Secondary | ICD-10-CM

## 2021-06-22 ENCOUNTER — Ambulatory Visit (HOSPITAL_COMMUNITY): Payer: Medicare HMO

## 2021-06-23 ENCOUNTER — Ambulatory Visit (HOSPITAL_COMMUNITY): Payer: Medicare HMO | Attending: Family Medicine | Admitting: Physical Therapy

## 2021-06-23 ENCOUNTER — Other Ambulatory Visit: Payer: Self-pay

## 2021-06-23 DIAGNOSIS — I878 Other specified disorders of veins: Secondary | ICD-10-CM | POA: Insufficient documentation

## 2021-06-23 DIAGNOSIS — R600 Localized edema: Secondary | ICD-10-CM | POA: Diagnosis not present

## 2021-06-23 DIAGNOSIS — S81802S Unspecified open wound, left lower leg, sequela: Secondary | ICD-10-CM | POA: Insufficient documentation

## 2021-06-23 NOTE — Therapy (Signed)
[place], [place], [address] Phone: [phone]   Fax:  [phone]  Wound Care Therapy  Patient Details  Name: Reginald [name] MRN: [id number] Date of Birth: [birth year] Referring Provider (PT): Tula Nakayama   Encounter Date: 06/23/2021   PT End of Session - 06/23/21 1743     Visit Number 3    Number of Visits 8    Date for PT Re-Evaluation 07/17/21    Authorization Type [employer]    Authorization - Visit Number 3    Authorization - Number of Visits 8    Progress Note Due on Visit 8    PT Start Time 1320    PT Stop Time 1400    PT Time Calculation (min) 40 min    Activity Tolerance Patient tolerated treatment well    Behavior During Therapy [facility] for tasks assessed/performed             Past Medical History:  Diagnosis Date   Anxiety    Aortic stenosis    Arthritis    Asthma    Back pain    Cellulitis of skin with lymphangitis    CHF (congestive heart failure) (HCC)    Chronic diastolic congestive heart failure (HCC)    Chronic venous insufficiency    Constipation    COPD (chronic obstructive pulmonary disease) ([place])    Depression    Diabetes ([place])    Dyspnea    Essential hypertension    Gout    Heart murmur    Hypertension    Morbid obesity ([place])    Obesity    Pneumonia    walking pneumonia   Prostatitis    Pulmonary embolism ([place])    Pulmonary hypertension ([place])    Pyelonephritis    S/P aortic valve replacement with bioprosthetic valve 08/23/2017   25 mm Edwards Inspiris Resilia stented bovine pericardial tissue valve   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   24 mm Hemashield supracoronary straight graft    Sleep apnea    cpap    Thoracic ascending aortic aneurysm    Thoracic ascending aortic aneurysm    Tobacco abuse     Past Surgical History:  Procedure Laterality Date   AORTIC VALVE REPLACEMENT N/A 08/23/2017   Procedure: AORTIC VALVE REPLACEMENT (AVR) USING INSPIRIS  RESILIA AORTIC VALVE SIZE 25 MM;  Surgeon: Rexene Alberts, MD;  Location: [place];  Service: Open Heart Surgery;  Laterality: N/A;   CARDIAC VALVE REPLACEMENT N/A    Phreesia 02/07/2020   COLONOSCOPY WITH PROPOFOL N/A 11/14/2020   Procedure: COLONOSCOPY WITH PROPOFOL;  Surgeon: Gatha Mayer, MD;  Location[facility] ENDOSCOPY;  Service: Endoscopy;  Laterality: N/A;   ESOPHAGOGASTRODUODENOSCOPY (EGD) WITH PROPOFOL N/A 11/14/2020   Procedure: ESOPHAGOGASTRODUODENOSCOPY (EGD) WITH PROPOFOL;  Surgeon: Gatha Mayer, MD;  Location: [place];  Service: Endoscopy;  Laterality: N/A;   MULTIPLE EXTRACTIONS WITH ALVEOLOPLASTY N/A 07/23/2017   Procedure: Extraction of tooth #10 with alveoloplasty and gross debridement of remaining teeth;  Surgeon: Lenn Cal, DDS;  Location: [place];  Service: Oral Surgery;  Laterality: N/A;   PACEMAKER IMPLANT N/A 08/28/2017    St Jude Medical Assurity MRI conditional  dual-chamber pacemaker for symptomatic complete heart blockby Dr Rayann Heman   TEE WITHOUT CARDIOVERSION N/A 07/16/2017   Procedure: TRANSESOPHAGEAL ECHOCARDIOGRAM (TEE);  Surgeon: Lelon Perla, MD;  Location: [facility] ENDOSCOPY;  Service: Cardiovascular;  Laterality: N/A;   TEE WITHOUT CARDIOVERSION N/A 08/23/2017   Procedure: TRANSESOPHAGEAL  ECHOCARDIOGRAM (TEE);  Surgeon: Rexene Alberts, MD;  Location: [place];  Service: Open Heart Surgery;  Laterality: N/A;   THORACIC AORTIC ANEURYSM REPAIR N/A 08/23/2017   Procedure: THORACIC ASCENDING ANEURYSM REPAIR (AAA) USING HEMASHIELD GOLD KNITTED MICROVEL DOUBLE VELOUR VASCULAR GRAFT D: 24 MM  L: 30 CM;  Surgeon: Rexene Alberts, MD;  Location: [place];  Service: Open Heart Surgery;  Laterality: N/A;    There were no vitals filed for this visit.               Wound Therapy - 06/23/21 1739     Subjective pt states he is doing well without any issues today.  STates the dressing stayed up and was more comfortable with the foam.    Patient and Family  Stated Goals Wound healed and leg swelling to be down    Date of Onset 05/20/21    Prior Treatments self care, antibiotics.    Evaluation and Treatment Procedures Explained to Patient/Family Yes    Evaluation and Treatment Procedures agreed to    Wound Properties Date First Assessed: 06/17/21 Time First Assessed: 1410 Location: Leg Location Orientation: Left Present on Admission: Yes   Wound Image Images linked: 1    Dressing Type Compression wrap;Impregnated gauze (bismuth)    Dressing Changed Changed    Dressing Status Old drainage    Dressing Change Frequency PRN    Site / Wound Assessment Pink;Yellow    % Wound base Red or Granulating 15%    % Wound base Yellow/Fibrinous Exudate 85%    Wound Length (cm) 1.4 cm    Wound Width (cm) 0.5 cm    Wound Depth (cm) 0.1 cm    Wound Volume (cm^3) 0.07 cm^3    Wound Surface Area (cm^2) 0.7 cm^2    Margins Attached edges (approximated)    Drainage Amount Minimal    Drainage Description Serosanguineous    Treatment Cleansed;Debridement (Selective)    Selective Debridement - Location wound and superior scabbed area    Selective Debridement - Tools Used Forceps;Scissors    Selective Debridement - Tissue Removed devitalized tissue.    Wound Therapy - Clinical Statement PT returns with dressing remaining up and intact.  Pt reports no pain or issues.   Able to remove scab from most superior area and discovered a little depth and bleeding.  Cleansed well and dressed with xerform.  Pt now with only one area remaining at anterior ankle measuring 1.4X 0.5 X 0.1.  Able to debride approx. 10% of slough but still heavily covered with sensitivity.    Discussed need for compression garments he will need to wear daily when discharged and need for a donning device as he is unable to reach his feet.  Measurements taken of LEs and will research options for patient.    Continued with xeroform to wounds and profore using  foam.    Wound Therapy - Functional Problem  List difficult to bathe, dress, walk    Factors Delaying/Impairing Wound Healing Diabetes Mellitus;Altered sensation;Infection - systemic/local;Immobility;Multiple medical problems;Polypharmacy;Vascular compromise    Hydrotherapy Plan Debridement;Dressing change;Patient/family education;Other (comment)   manual to decrease edema   Wound Therapy - Frequency 2X / week    Wound Therapy - Current Recommendations PT   4 weeks.  Discuss if pt is interested in lymphedema treatments   Wound Plan cleanse, moisturize, debridement, manual and compression bandaging.  Give information on compression stockings to order from [place] as well as butler.    Dressing  xeroform,  4X4, 1/2" foam over #9 lymph netting, profore compression bandaging to LE                       PT Short Term Goals - 06/17/21 1541       PT SHORT TERM GOAL #1   Title Pt wound to be 100% granulated and no larger than .7x .7    Time 2    Period Weeks    Status New    Target Date 07/01/21      PT SHORT TERM GOAL #2   Title PT to have lost 2 cm from 30and 20 cm measurements to create a wound healing environment.    Time 2    Period Weeks    Status New               PT Long Term Goals - 06/17/21 1542       PT LONG TERM GOAL #1   Title PT wound on his LT LE to be healed    Time 4    Period Weeks    Status New    Target Date 07/15/21      PT LONG TERM GOAL #2   Title PT to have and be using a compression pump    Time 4    Period Weeks    Status New      PT LONG TERM GOAL #4   Title PT to have and be wearing juxtfit to control edema in LE to prevent future wounds.    Time 4    Period Weeks    Status New                    Patient will benefit from skilled therapeutic intervention in order to improve the following deficits and impairments:     Visit Diagnosis: Localized edema  Leg wound, left, sequela  Chronic venous stasis     Problem List Patient Active Problem List    Diagnosis Date Noted   Melena 06/13/2021   Wound cellulitis 06/09/2021   SOB (shortness of breath) 06/09/2021   Pain due to onychomycosis of toenails of both feet 05/02/2021   Diabetic neuropathy ([place]) 05/02/2021   Callus of foot 05/02/2021   BPH with obstruction/lower urinary tract symptoms 04/05/2021   Acute on chronic diastolic CHF (congestive heart failure) ([place]) 03/25/2021   Morbid obesity with BMI of 60.0-69.9, adult ([place]) 03/25/2021   Mixed hyperlipidemia 03/24/2021   Hematuria 03/24/2021   Atrial fibrillation, chronic (HCC) 03/24/2021   Prolonged QT interval 03/24/2021   Iron deficiency anemia 02/21/2021   Constipation 02/21/2021   Rectal bleeding    Chronic blood loss anemia 11/12/2020   Gastrointestinal hemorrhage 10/08/2020   S/P placement of cardiac pacemaker 02/10/2020   Encounter for examination following treatment at hospital 02/10/2020   Anxiety 02/10/2020   DM2 (diabetes mellitus, type 2) ([place]) 02/10/2020   OSA on CPAP    History of pulmonary embolism 03/16/2019   Paroxysmal atrial fibrillation ([place]) 09/05/2018   COPD (chronic obstructive pulmonary disease) ([place]) 05/07/2018   Gastroesophageal reflux disease    Pulmonary embolism ([place]) 09/18/2017   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 08/23/2017   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   Pulmonary hypertension ([place])    Chronic periodontitis 07/18/2017   Morbid obesity ([place])    Essential hypertension    Tobacco abuse    Chronic heart failure with preserved ejection fraction (HFpEF) (  [place])    Chronic venous insufficiency    Bartholome Bill, WTA [phone]  Teena Irani, PTA 06/23/2021, 5:43 PM  [facility] [address] [place], [place], [address] Phone: [phone]   Fax:  [phone]  Name: Reginald [name] MRN: [id number] Date of Birth: 1971-02-16

## 2021-06-27 ENCOUNTER — Other Ambulatory Visit: Payer: Self-pay

## 2021-06-27 ENCOUNTER — Encounter (HOSPITAL_COMMUNITY): Payer: Self-pay | Admitting: Physical Therapy

## 2021-06-27 ENCOUNTER — Ambulatory Visit (HOSPITAL_COMMUNITY): Payer: Medicare HMO | Admitting: Physical Therapy

## 2021-06-27 ENCOUNTER — Encounter: Payer: Self-pay | Admitting: Internal Medicine

## 2021-06-27 DIAGNOSIS — R600 Localized edema: Secondary | ICD-10-CM | POA: Diagnosis not present

## 2021-06-27 DIAGNOSIS — S81802S Unspecified open wound, left lower leg, sequela: Secondary | ICD-10-CM | POA: Diagnosis not present

## 2021-06-27 DIAGNOSIS — I878 Other specified disorders of veins: Secondary | ICD-10-CM

## 2021-06-27 NOTE — Therapy (Signed)
[place], [place], [address] Phone: [phone]   Fax:  [phone]  Wound Care Therapy  Patient Details  Name: Reginald [name] MRN: [id number] Date of Birth: [birth year] Referring Provider (PT): Tula Nakayama   Encounter Date: 06/27/2021   PT End of Session - 06/27/21 0913     Visit Number 4    Number of Visits 8    Date for PT Re-Evaluation 07/17/21    Authorization Type [employer]    Authorization - Visit Number 4    Authorization - Number of Visits 8    Progress Note Due on Visit 8    PT Start Time 0820    PT Stop Time 0905    PT Time Calculation (min) 45 min    Activity Tolerance Patient tolerated treatment well    Behavior During Therapy [facility] for tasks assessed/performed             Past Medical History:  Diagnosis Date   Anxiety    Aortic stenosis    Arthritis    Asthma    Back pain    Cellulitis of skin with lymphangitis    CHF (congestive heart failure) (HCC)    Chronic diastolic congestive heart failure (HCC)    Chronic venous insufficiency    Constipation    COPD (chronic obstructive pulmonary disease) ([place])    Depression    Diabetes ([place])    Dyspnea    Essential hypertension    Gout    Heart murmur    Hypertension    Morbid obesity ([place])    Obesity    Pneumonia    walking pneumonia   Prostatitis    Pulmonary embolism ([place])    Pulmonary hypertension ([place])    Pyelonephritis    S/P aortic valve replacement with bioprosthetic valve 08/23/2017   25 mm Edwards Inspiris Resilia stented bovine pericardial tissue valve   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   24 mm Hemashield supracoronary straight graft    Sleep apnea    cpap    Thoracic ascending aortic aneurysm    Thoracic ascending aortic aneurysm    Tobacco abuse     Past Surgical History:  Procedure Laterality Date   AORTIC VALVE REPLACEMENT N/A 08/23/2017   Procedure: AORTIC VALVE REPLACEMENT (AVR) USING INSPIRIS  RESILIA AORTIC VALVE SIZE 25 MM;  Surgeon: Rexene Alberts, MD;  Location: [place];  Service: Open Heart Surgery;  Laterality: N/A;   CARDIAC VALVE REPLACEMENT N/A    Phreesia 02/07/2020   COLONOSCOPY WITH PROPOFOL N/A 11/14/2020   Procedure: COLONOSCOPY WITH PROPOFOL;  Surgeon: Gatha Mayer, MD;  Location: [facility] ENDOSCOPY;  Service: Endoscopy;  Laterality: N/A;   ESOPHAGOGASTRODUODENOSCOPY (EGD) WITH PROPOFOL N/A 11/14/2020   Procedure: ESOPHAGOGASTRODUODENOSCOPY (EGD) WITH PROPOFOL;  Surgeon: Gatha Mayer, MD;  Location: [place];  Service: Endoscopy;  Laterality: N/A;   MULTIPLE EXTRACTIONS WITH ALVEOLOPLASTY N/A 07/23/2017   Procedure: Extraction of tooth #10 with alveoloplasty and gross debridement of remaining teeth;  Surgeon: Lenn Cal, DDS;  Location: [place];  Service: Oral Surgery;  Laterality: N/A;   PACEMAKER IMPLANT N/A 08/28/2017    St Jude Medical Assurity MRI conditional  dual-chamber pacemaker for symptomatic complete heart blockby Dr Rayann Heman   TEE WITHOUT CARDIOVERSION N/A 07/16/2017   Procedure: TRANSESOPHAGEAL ECHOCARDIOGRAM (TEE);  Surgeon: Lelon Perla, MD;  Location: [facility] ENDOSCOPY;  Service: Cardiovascular;  Laterality: N/A;   TEE WITHOUT CARDIOVERSION N/A 08/23/2017   Procedure: TRANSESOPHAGEAL  ECHOCARDIOGRAM (TEE);  Surgeon: Rexene Alberts, MD;  Location: [place];  Service: Open Heart Surgery;  Laterality: N/A;   THORACIC AORTIC ANEURYSM REPAIR N/A 08/23/2017   Procedure: THORACIC ASCENDING ANEURYSM REPAIR (AAA) USING HEMASHIELD GOLD KNITTED MICROVEL DOUBLE VELOUR VASCULAR GRAFT D: 24 MM  L: 30 CM;  Surgeon: Rexene Alberts, MD;  Location: [place];  Service: Open Heart Surgery;  Laterality: N/A;    There were no vitals filed for this visit.         LYMPHEDEMA/ONCOLOGY QUESTIONNAIRE - 06/27/21 0001       Right Lower Extremity Lymphedema   30 cm Proximal to Floor at Lateral Plantar Foot 59.8 cm   was 58.4   20 cm Proximal to Floor at Lateral Plantar  Foot 46.3 1   was 51   10 cm Proximal to Floor at Lateral Malleoli 4.43 cm   was 33.5                 Wound Therapy - 06/27/21 0001     Subjective Pt states he is having surgery on Friday for a deviated septum.    Patient and Family Stated Goals Wound healed and leg swelling to be down    Date of Onset 05/20/21    Prior Treatments self care, antibiotics.    Pain Score 0-No pain    Evaluation and Treatment Procedures Explained to Patient/Family Yes    Evaluation and Treatment Procedures agreed to    Wound Properties Date First Assessed: 06/17/21 Time First Assessed: 7858 Location: Leg Location Orientation: Left Present on Admission: Yes   Dressing Type Compression wrap    Dressing Changed Changed    Dressing Status Old drainage;Other (Comment)    Dressing Change Frequency PRN    Site / Wound Assessment Red;Yellow    % Wound base Red or Granulating 15%    % Wound base Yellow/Fibrinous Exudate 85%    Wound Length (cm) 1.2 cm   initally 1.7   Wound Width (cm) 0.8 cm   initially 1.2   Wound Depth (cm) 0.1 cm    Wound Volume (cm^3) 0.1 cm^3    Wound Surface Area (cm^2) 0.96 cm^2    Margins Attached edges (approximated)    Drainage Amount Minimal    Drainage Description Serosanguineous    Treatment Cleansed;Debridement (Selective)   debridement lifted slough exposing greater length of wound.   Selective Debridement - Location woundbed    Selective Debridement - Tools Used Forceps    Selective Debridement - Tissue Removed slough, and devitalized tissue.    Wound Therapy - Clinical Statement Pt circumference at 30 cm increased which is due the fact that this is where the compression bandage is falling to; mid leg has decreased 5 cm.  Wound has decreased in size and demonstrates increased granulation.  Pt will benefit from a compression pump at discharge to prevent future wounds, unlikely that pt will be able to don any type of compression garment.    Wound Therapy - Functional  Problem List difficult to bathe, dress, walk    Factors Delaying/Impairing Wound Healing Diabetes Mellitus;Altered sensation;Infection - systemic/local;Immobility;Multiple medical problems;Polypharmacy;Vascular compromise    Hydrotherapy Plan Debridement;Dressing change;Patient/family education;Other (comment)   manual to decrease edema   Wound Therapy - Frequency 2X / week    Wound Therapy - Current Recommendations PT   4 weeks.  Discuss if pt is interested in lymphedema treatments   Wound Plan cleanse, moisturize, debridement, manual and compression bandaging.  Give information on  compression stockings to order from [place] as well as butler.    Dressing  xeroform and compression bandage system.                       PT Short Term Goals - 06/27/21 0914       PT SHORT TERM GOAL #1   Title Pt wound to be 100% granulated and no larger than .7x .7    Time 2    Period Weeks    Status On-going    Target Date 07/01/21      PT SHORT TERM GOAL #2   Title PT to have lost 2 cm from 30and 20 cm measurements to create a wound healing environment.    Time 2    Period Weeks    Status Partially Met               PT Long Term Goals - 06/27/21 0915       PT LONG TERM GOAL #1   Title PT wound on his LT LE to be healed    Time 4    Period Weeks    Status On-going    Target Date 07/15/21      PT LONG TERM GOAL #2   Title PT to have and be using a compression pump    Time 4    Period Weeks    Status On-going      PT LONG TERM GOAL #4   Title PT to have and be wearing juxtfit to control edema in LE to prevent future wounds.    Time 4    Period Weeks    Status On-going                   Plan - 06/27/21 0914     Personal Factors and Comorbidities Comorbidity 3+;Finances;Fitness;Time since onset of injury/illness/exacerbation    Comorbidities CHF, CVI, COPD, DM, morbid obesity, aortic valve replacement with pacemaker    Examination-Activity Limitations  Bathing;Dressing;Hygiene/Grooming;Locomotion Level;Stand    Examination-Participation Restrictions Community Activity;Shop;Meal Prep;Other    Stability/Clinical Decision Making Evolving/Moderate complexity    Rehab Potential Good    PT Frequency 2x / week    PT Duration 4 weeks    PT Treatment/Interventions Patient/family education;Other (comment);Manual techniques   debridement and dressing change   PT Next Visit Plan assess comfort of profore dressing, begin manual,             Patient will benefit from skilled therapeutic intervention in order to improve the following deficits and impairments:  Obesity, Pain, Decreased skin integrity, Increased edema  Visit Diagnosis: Localized edema  Leg wound, left, sequela  Chronic venous stasis     Problem List Patient Active Problem List   Diagnosis Date Noted   Melena 06/13/2021   Wound cellulitis 06/09/2021   SOB (shortness of breath) 06/09/2021   Pain due to onychomycosis of toenails of both feet 05/02/2021   Diabetic neuropathy ([place]) 05/02/2021   Callus of foot 05/02/2021   BPH with obstruction/lower urinary tract symptoms 04/05/2021   Acute on chronic diastolic CHF (congestive heart failure) ([place]) 03/25/2021   Morbid obesity with BMI of 60.0-69.9, adult ([place]) 03/25/2021   Mixed hyperlipidemia 03/24/2021   Hematuria 03/24/2021   Atrial fibrillation, chronic (HCC) 03/24/2021   Prolonged QT interval 03/24/2021   Iron deficiency anemia 02/21/2021   Constipation 02/21/2021   Rectal bleeding    Chronic blood loss anemia 11/12/2020   Gastrointestinal hemorrhage 10/08/2020  S/P placement of cardiac pacemaker 02/10/2020   Encounter for examination following treatment at hospital 02/10/2020   Anxiety 02/10/2020   DM2 (diabetes mellitus, type 2) ([place]) 02/10/2020   OSA on CPAP    History of pulmonary embolism 03/16/2019   Paroxysmal atrial fibrillation ([place]) 09/05/2018   COPD (chronic obstructive pulmonary disease) ([place])  05/07/2018   Gastroesophageal reflux disease    Pulmonary embolism ([place]) 09/18/2017   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 08/23/2017   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   Pulmonary hypertension (HCC)    Chronic periodontitis 07/18/2017   Morbid obesity ([place])    Essential hypertension    Tobacco abuse    Chronic heart failure with preserved ejection fraction (HFpEF) ([place])    Chronic venous insufficiency   Rayetta Humphrey, PT CLT [phone]  06/27/2021, 9:17 AM  [facility] [address] [place], [place], [address] Phone: [phone]   Fax:  [phone]  Name: Reginald [name] MRN: [id number] Date of Birth: Jan 05, 1971

## 2021-06-27 NOTE — Pre-Procedure Instructions (Signed)
Surgical Instructions    Your procedure is scheduled on Friday, February 10th.  Report to [facility] Main Entrance "A" at 11:30 A.M., then check in with the Admitting office.  Call this number if you have problems the morning of surgery:  [phone]   If you have any questions prior to your surgery date call [phone]: Open Monday-Friday 8am-4pm    Remember:  Do not eat or drink after midnight the night before your surgery    Take these medicines the morning of surgery with A SIP OF WATER  allopurinol (ZYLOPRIM)  atorvastatin (LIPITOR) buPROPion (WELLBUTRIN XL) fexofenadine (ALLEGRA)  metoprolol tartrate (LOPRESSOR) pantoprazole (PROTONIX) tamsulosin (FLOMAX)  Tiotropium Bromide Monohydrate (SPIRIVA RESPIMAT)   Take these medications as needed: acetaminophen (TYLENOL)  Albuterol nebulizer Albuterol inhaler-please bring with you to the hospital hydrOXYzine (ATARAX/VISTARIL)   Follow your surgeon's instructions on when to stop XARELTO.  If no instructions were given by your surgeon then you will need to call the office to get those instructions.    As of today, STOP taking any Aspirin (unless otherwise instructed by your surgeon) Aleve, Naproxen, Ibuprofen, Motrin, Advil, Goody's, BC's, all herbal medications, fish oil, and all vitamins.  WHAT DO I DO ABOUT MY DIABETES MEDICATION?  Do not take empagliflozin (JARDIANCE) the day before surgery; Thursday, 06/29/21.    Do not take empagliflozin (JARDIANCE), metFORMIN (GLUCOPHAGE), Semaglutide (RYBELSUS) or glipiZIDE (GLUCOTROL XL) the morning of surgery.      HOW TO MANAGE YOUR DIABETES BEFORE AND AFTER SURGERY  Why is it important to control my blood sugar before and after surgery? Improving blood sugar levels before and after surgery helps healing and can limit problems. A way of improving blood sugar control is eating a healthy diet by:  Eating less sugar and carbohydrates  Increasing activity/exercise  Talking with  your doctor about reaching your blood sugar goals High blood sugars (greater than 180 mg/dL) can raise your risk of infections and slow your recovery, so you will need to focus on controlling your diabetes during the weeks before surgery. Make sure that the doctor who takes care of your diabetes knows about your planned surgery including the date and location.  How do I manage my blood sugar before surgery? Check your blood sugar at least 4 times a day, starting 2 days before surgery, to make sure that the level is not too high or low.  Check your blood sugar the morning of your surgery when you wake up and every 2 hours until you get to the Short Stay unit.  If your blood sugar is less than 70 mg/dL, you will need to treat for low blood sugar: Do not take insulin. Treat a low blood sugar (less than 70 mg/dL) with  cup of clear juice (cranberry or apple), 4 glucose tablets, OR glucose gel. Recheck blood sugar in 15 minutes after treatment (to make sure it is greater than 70 mg/dL). If your blood sugar is not greater than 70 mg/dL on recheck, call [phone] for further instructions. Report your blood sugar to the short stay nurse when you get to Short Stay.  If you are admitted to the hospital after surgery: Your blood sugar will be checked by the staff and you will probably be given insulin after surgery (instead of oral diabetes medicines) to make sure you have good blood sugar levels. The goal for blood sugar control after surgery is 80-180 mg/dL.  Do NOT Smoke (Tobacco/Vaping) for 24 hours prior to your procedure.  If you use a CPAP at night, you may bring your mask/headgear for your overnight stay.   Contacts, glasses, piercing's, hearing aid's, dentures or partials may not be worn into surgery, please bring cases for these belongings.    For patients admitted to the hospital, discharge time will be determined by your treatment team.   Patients discharged the  day of surgery will not be allowed to drive home, and someone needs to stay with them for 24 hours.  NO VISITORS WILL BE ALLOWED IN PRE-OP WHERE PATIENTS ARE PREPPED FOR SURGERY.  ONLY 1 SUPPORT PERSON MAY BE PRESENT IN THE WAITING ROOM WHILE YOU ARE IN SURGERY.  IF YOU ARE TO BE ADMITTED, ONCE YOU ARE IN YOUR ROOM YOU WILL BE ALLOWED TWO (2) VISITORS. (1) VISITOR MAY STAY OVERNIGHT BUT MUST ARRIVE TO THE ROOM BY 8pm.  Minor children may have two parents present. Special consideration for safety and communication needs will be reviewed on a case by case basis.   Special instructions:   [facility]- Preparing For Surgery  Before surgery, you can play an important role. Because skin is not sterile, your skin needs to be as free of germs as possible. You can reduce the number of germs on your skin by washing with CHG (chlorahexidine gluconate) Soap before surgery.  CHG is an antiseptic cleaner which kills germs and bonds with the skin to continue killing germs even after washing.    Oral Hygiene is also important to reduce your risk of infection.  Remember - BRUSH YOUR TEETH THE MORNING OF SURGERY WITH YOUR REGULAR TOOTHPASTE  Please do not use if you have an allergy to CHG or antibacterial soaps. If your skin becomes reddened/irritated stop using the CHG.  Do not shave (including legs and underarms) for at least 48 hours prior to first CHG shower. It is OK to shave your face.  Please follow these instructions carefully.   Shower the NIGHT BEFORE SURGERY and the MORNING OF SURGERY  If you chose to wash your hair, wash your hair first as usual with your normal shampoo.  After you shampoo, rinse your hair and body thoroughly to remove the shampoo.  Use CHG Soap as you would any other liquid soap. You can apply CHG directly to the skin and wash gently with a scrungie or a clean washcloth.   Apply the CHG Soap to your body ONLY FROM THE NECK DOWN.  Do not use on open wounds or open sores. Avoid  contact with your eyes, ears, mouth and genitals (private parts). Wash Face and genitals (private parts)  with your normal soap.   Wash thoroughly, paying special attention to the area where your surgery will be performed.  Thoroughly rinse your body with warm water from the neck down.  DO NOT shower/wash with your normal soap after using and rinsing off the CHG Soap.  Pat yourself dry with a CLEAN TOWEL.  Wear CLEAN PAJAMAS to bed the night before surgery  Place CLEAN SHEETS on your bed the night before your surgery  DO NOT SLEEP WITH PETS.   Day of Surgery: Shower with CHG soap. Do not wear jewelry. Do not wear lotions, powders, colognes, or deodorant. Men may shave face and neck. Do not bring valuables to the hospital. [facility] is not responsible for any belongings or valuables. Wear Clean/Comfortable clothing the morning of surgery Remember to brush your teeth WITH YOUR REGULAR  TOOTHPASTE.   Please read over the following fact sheets that you were given.   3 days prior to your procedure or After your COVID test   You are not required to quarantine however you are required to wear a well-fitting mask when you are out and around people not in your household. If your mask becomes wet or soiled, replace with a new one.   Wash your hands often with soap and water for 20 seconds or clean your hands with an alcohol-based hand sanitizer that contains at least 60% alcohol.   Do not share personal items.   Notify your provider:  o if you are in close contact with someone who has COVID  o or if you develop a fever of 100.4 or greater, sneezing, cough, sore throat, shortness of breath or body aches.

## 2021-06-27 NOTE — Progress Notes (Signed)
[facility] DEVICE PROGRAMMING  Patient Information: Name:  Reginald [name]  DOB:  [birth year]  MRN:  [id number]    Jacqlyn Larsen, RN  P Cv Div Heartcare Device Planned Procedure:  Septoplasty, bilateral turbinate reduction  Surgeon:  Dr. Benjamine Mola  Date of Procedure:  07/01/21  Cautery will be used.  Position during surgery:  Supine   Please send documentation back to:  [facility] (Fax # [phone])   Jacqlyn Larsen, RN  06/27/2021 3:38 PM  Device Information:  Clinic EP Physician:  Thompson Grayer, MD   Device Type:  Pacemaker Manufacturer and Phone #:  St. Jude/Abbott: [phone] Pacemaker Dependent?:  No. Date of Last Device Check:  05/25/21 Remote/ 01/21/21 in-clinic Normal Device Function?:  Yes.    Electrophysiologist's Recommendations:  Have magnet available. Provide continuous ECG monitoring when magnet is used or reprogramming is to be performed.  Procedure may interfere with device function.  Magnet should be placed over device during procedure.  Per Device Clinic [address], [place] Ram, RN  4:24 PM 06/27/2021

## 2021-06-28 ENCOUNTER — Encounter (HOSPITAL_COMMUNITY): Payer: Self-pay

## 2021-06-28 ENCOUNTER — Encounter: Payer: Self-pay | Admitting: Pharmacist

## 2021-06-28 ENCOUNTER — Other Ambulatory Visit: Payer: Self-pay

## 2021-06-28 ENCOUNTER — Ambulatory Visit (HOSPITAL_COMMUNITY): Payer: Medicare HMO | Admitting: Physical Therapy

## 2021-06-28 ENCOUNTER — Encounter (HOSPITAL_COMMUNITY)
Admission: RE | Admit: 2021-06-28 | Discharge: 2021-06-28 | Disposition: A | Discharge status: Home / Self Care | Payer: Medicare HMO | Source: Ambulatory Visit | Attending: Otolaryngology | Admitting: Otolaryngology

## 2021-06-28 ENCOUNTER — Other Ambulatory Visit: Payer: Self-pay | Admitting: Internal Medicine

## 2021-06-28 VITALS — BP 110/60 | HR 72 | Temp 98.2°F | Resp 20 | Ht 71.0 in | Wt >= 6400 oz

## 2021-06-28 DIAGNOSIS — Z01812 Encounter for preprocedural laboratory examination: Secondary | ICD-10-CM | POA: Insufficient documentation

## 2021-06-28 DIAGNOSIS — E119 Type 2 diabetes mellitus without complications: Secondary | ICD-10-CM | POA: Diagnosis not present

## 2021-06-28 DIAGNOSIS — E1169 Type 2 diabetes mellitus with other specified complication: Secondary | ICD-10-CM

## 2021-06-28 DIAGNOSIS — Z01818 Encounter for other preprocedural examination: Secondary | ICD-10-CM

## 2021-06-28 HISTORY — DX: Anemia, unspecified: D64.9

## 2021-06-28 LAB — CBC
HCT: 36 % — ABNORMAL LOW (ref 39.0–52.0)
Hemoglobin: 10.8 g/dL — ABNORMAL LOW (ref 13.0–17.0)
MCH: 24.7 pg — ABNORMAL LOW (ref 26.0–34.0)
MCHC: 30 g/dL (ref 30.0–36.0)
MCV: 82.4 fL (ref 80.0–100.0)
Platelets: 351 10*3/uL (ref 150–400)
RBC: 4.37 MIL/uL (ref 4.22–5.81)
RDW: 17.5 % — ABNORMAL HIGH (ref 11.5–15.5)
WBC: 10.8 10*3/uL — ABNORMAL HIGH (ref 4.0–10.5)
nRBC: 0 % (ref 0.0–0.2)

## 2021-06-28 LAB — BASIC METABOLIC PANEL
Anion gap: 10 (ref 5–15)
BUN: 28 mg/dL — ABNORMAL HIGH (ref 6–20)
CO2: 25 mmol/L (ref 22–32)
Calcium: 9 mg/dL (ref 8.9–10.3)
Chloride: 101 mmol/L (ref 98–111)
Creatinine, Ser: 1.88 mg/dL — ABNORMAL HIGH (ref 0.61–1.24)
GFR, Estimated: 43 mL/min — ABNORMAL LOW (ref >60)
Glucose, Bld: 109 mg/dL — ABNORMAL HIGH (ref 70–99)
Potassium: 4.5 mmol/L (ref 3.5–5.1)
Sodium: 136 mmol/L (ref 135–145)

## 2021-06-28 LAB — HEMOGLOBIN A1C
Hgb A1c MFr Bld: 5.7 % — ABNORMAL HIGH (ref 4.8–5.6)
Mean Plasma Glucose: 116.89 mg/dL

## 2021-06-28 LAB — GLUCOSE, CAPILLARY: Glucose-Capillary: 110 mg/dL — ABNORMAL HIGH (ref 70–99)

## 2021-06-28 NOTE — Progress Notes (Signed)
PCP - Dr. Ihor Dow Cardiologist - Dr. Johnsie Cancel  PPM/ICD - PPM Device Orders - Device Information:   Clinic EP Physician:  Thompson Grayer, MD    Device Type:  Pacemaker Manufacturer and Phone #:  St. Jude/Abbott: [phone] Pacemaker Dependent?:  No. Date of Last Device Check:  05/25/21 Remote/ 01/21/21 in-clinic          Normal Device Function?:  Yes.     Electrophysiologist's Recommendations:   ? Have magnet available. ? Provide continuous ECG monitoring when magnet is used or reprogramming is to be performed.  ? Procedure may interfere with device function.  Magnet should be placed over device during procedure. Rep Notified - Rep notified on 06/27/21 at [place].   Chest x-ray - n/a EKG - 03/24/21 Stress Test - denies ECHO - 07/28/20 Cardiac Cath -07/16/17   Sleep Study - OSA+ CPAP - uses nightly   Fasting Blood Sugar - 130-170 Checks Blood Sugar __1__ time a day  Blood Thinner Instructions: Xarelto, LD 06/26/21 Aspirin Instructions: n/a  NPO at midnight  COVID TEST- Not indicated. Pt states he tested positive on a home test on 05/22/21.   Anesthesia review: Yes, cardiac clearance received 06/07/21.  Patient denies shortness of breath, fever, cough and chest pain at PAT appointment   All instructions explained to the patient, with a verbal understanding of the material. Patient agrees to go over the instructions while at home for a better understanding. Patient also instructed to self quarantine after being tested for COVID-19. The opportunity to ask questions was provided.

## 2021-06-29 ENCOUNTER — Encounter (HOSPITAL_COMMUNITY): Payer: Self-pay | Admitting: Physician Assistant

## 2021-06-29 NOTE — Progress Notes (Signed)
Anesthesia Chart Review:  Follows with cardiology for history of severe AS (s/p bovine tissue AVR in 08/2017 with repair of ascending thoracic aortic aneurysm), normal coronary arteries by cardiac catheterization in 2019, HFpEF, paroxysmal atrial fibrillation, CHB (s/p St. Jude PPM placement in 08/2017), history of bilateral PE, HTN, morbid obesity and OSA on CPAP. Cardiac clearance per telephone encounter 06/07/2021, "Chart reviewed as part of pre-operative protocol coverage. Given past medical history and time since last visit, based on ACC/AHA guidelines, Reginald [name] would be at acceptable risk for the planned procedure without further cardiovascular testing. Patient with diagnosis of afib on Xarelto for anticoagulation. Patient also has a hx of a single PE post AVR- was on warfarin but was never therapeutic.Marland KitchenMarland KitchenPer office protocol, patient can hold Xarelto for 3 days prior to procedure. Based on pharmacokinetics Xarelto will be fully cleared in about 2 days, therefore a 5 day hold is not needed."  COPD maintained on Spiriva and as needed albuterol.  Non-insulin-dependent DM2, preop A1c 5.7.  History of CKD, baseline creatinine appears to be ~1.5 over the past 6 months.  1.88 on preop labs.  Preop labs also notable for mild anemia with hemoglobin 10.8.  Labs otherwise unremarkable.  Patient reported last dose Xarelto 06/26/2021.  EKG 03/24/2021: Sinus rhythm.  Rate 87.  Right bundle branch block.  Perioperative prescription for implanted cardiac device programming per progress note 06/27/2018: Device Information:   Clinic EP Physician:  Thompson Grayer, MD    Device Type:  Pacemaker Manufacturer and Phone #:  St. Jude/Abbott: [phone] Pacemaker Dependent?:  No. Date of Last Device Check:  05/25/21 Remote/ 01/21/21 in-clinic          Normal Device Function?:  Yes.     Electrophysiologist's Recommendations:   Have magnet available. Provide continuous ECG monitoring when magnet is used or  reprogramming is to be performed.  Procedure may interfere with device function.  Magnet should be placed over device during procedure.  TTE 07/28/2020:  1. Images are limited despite use of Definity.   2. Left ventricular ejection fraction, by estimation, is 60 to 65%. The  left ventricle has normal function. Left ventricular endocardial border  not optimally defined to evaluate regional wall motion. There is mild left  ventricular hypertrophy. Left  ventricular diastolic parameters are indeterminate. Elevated left atrial  pressure.   3. Right ventricular systolic function is mildly reduced. The right  ventricular size is normal. There is normal pulmonary artery systolic  pressure. The estimated right ventricular systolic pressure is 27.0 mmHg.  Device wire present.   4. The mitral valve is grossly normal. Trivial mitral valve  regurgitation.   5. The aortic valve has been repaired/replaced. Aortic valve  regurgitation is not visualized. There is a 25 mm Edwards valve present in  the aortic position. Aortic valve mean gradient measures 26.0 mmHg. Mean  gradient was 23 mmHg in 2019, relatively  stable - could be suggestive of patient/prosthesis mismatch. Poorly  visualized.   6. The inferior vena cava is dilated in size with >50% respiratory  variability, suggesting right atrial pressure of 8 mmHg.   Cath 07/16/2017 (pre-AVR): 1. No angiographic evidence of CAD 2. Severe aortic stenosis by TEE/TTE.  Due to turbulent flow in the ascending aorta and movement of the catheters, I could not cross the valve. The patient had a TEE this am that demonstrated severe AS. I did not feel that further attempts at crossing the valve would provide any clinical data that would change his  treatment plan, though more attempts increased the risk of procedure complication. 3. Normal right heart pressures   Recommendations: Will continue planning for AVR vs TAVR. Pt to f/u with Dr. Roxy Manns after CT scans.     Wynonia Musty [facility] Short Stay Center/Anesthesiology Phone [phone] 06/29/2021 2:34 PM

## 2021-06-29 NOTE — Anesthesia Preprocedure Evaluation (Addendum)
Anesthesia Evaluation  Patient identified by MRN, date of birth, ID band Patient awake  General Assessment Comment:DEVIATED SEPTUM,TURBINATE HYPERTROPHY  Reviewed: Allergy & Precautions, NPO status , Patient's Chart, lab work & pertinent test results, reviewed documented beta blocker date and time   Airway Mallampati: II  TM Distance: >3 FB Neck ROM: Full    Dental  (+) Teeth Intact, Dental Advisory Given,    Pulmonary asthma , sleep apnea and Continuous Positive Airway Pressure Ventilation , COPD, former smoker,    Pulmonary exam normal breath sounds clear to auscultation       Cardiovascular hypertension, Pt. on home beta blockers and Pt. on medications +CHF  Normal cardiovascular exam+ pacemaker + Valvular Problems/Murmurs (s/p AVR)  Rhythm:Regular Rate:Normal  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 24 mm Hemashield supracoronary straight graft     Neuro/Psych PSYCHIATRIC DISORDERS Anxiety Depression negative neurological ROS     GI/Hepatic Neg liver ROS, GERD  Medicated and Controlled,  Endo/Other  diabetes, Type 2, Oral Hypoglycemic AgentsMorbid obesity  Renal/GU Renal InsufficiencyRenal disease     Musculoskeletal  (+) Arthritis ,   Abdominal   Peds  Hematology  (+) Blood dyscrasia (Xarelto), anemia ,   Anesthesia Other Findings Day of surgery medications reviewed with the patient.  Reproductive/Obstetrics                           Anesthesia Physical Anesthesia Plan  ASA: 4  Anesthesia Plan: General   Post-op Pain Management: Tylenol PO (pre-op)   Induction: Intravenous  PONV Risk Score and Plan: 2 and Midazolam, Dexamethasone and Ondansetron  Airway Management Planned: Oral ETT and Video Laryngoscope Planned  Additional Equipment:   Intra-op Plan:   Post-operative Plan: Extubation in OR  Informed Consent: I have reviewed the patients History and Physical, chart, labs  and discussed the procedure including the risks, benefits and alternatives for the proposed anesthesia with the patient or authorized representative who has indicated his/her understanding and acceptance.     Dental advisory given  Plan Discussed with: CRNA  Anesthesia Plan Comments: (PAT note by Karoline Caldwell, PA-C: Follows with cardiology for history of severeAS (s/p bovine tissue AVR in 08/2017 with repair of ascending thoracic aortic aneurysm),normalcoronary arteriesby cardiac catheterization in 2019,HFpEF,paroxysmal atrial fibrillation, CHB(s/pSt. JudePPM placement in 08/2017), history of bilateral PE, HTN, morbid obesity and OSAon CPAP. Cardiac clearance per telephone encounter 06/07/2021, "Chart reviewed as part of pre-operative protocol coverage. Given past medical history and time since last visit, based on ACC/AHA guidelines,Decklyn T Mundywould be at acceptable risk for the planned procedure without further cardiovascular testing.Patient with diagnosis ofafibon Xareltofor anticoagulation. Patient also has a hx of a single PE post AVR- was on warfarin but was never therapeutic.Marland KitchenMarland KitchenPer office protocol, patient can holdXareltofor 3days prior to procedure. Based on pharmacokinetics Xarelto will be fully cleared in about 2 days, therefore a 5 day hold is not needed."  COPD maintained on Spiriva and as needed albuterol.  Non-insulin-dependent DM2, preop A1c 5.7.  History of CKD, baseline creatinine appears to be ~1.5 over the past 6 months.  1.88 on preop labs.  Preop labs also notable for mild anemia with hemoglobin 10.8.  Labs otherwise unremarkable.  Patient reported last dose Xarelto 06/26/2021.  EKG 03/24/2021: Sinus rhythm.  Rate 87.  Right bundle branch block.  Perioperative prescription for implanted cardiac device programming per progress note 06/27/2018: Device Information:  Clinic EP Physician:James Allred, MD  Device Type:Pacemaker Manufacturer and Phone  #:St. Jude/Abbott:  [phone] Pacemaker Dependent?:No. Date of Last Device Check:05/25/21 Remote/ 01/21/21 in-clinicNormal Device Function?:Yes.  Electrophysiologist's Recommendations:   Have magnet available.  Provide continuous ECG monitoring when magnet is used or reprogramming is to be performed.  Procedure may interfere with device function. Magnet should be placed over device during procedure.  TTE 07/28/2020: 1. Images are limited despite use of Definity.  2. Left ventricular ejection fraction, by estimation, is 60 to 65%. The  left ventricle has normal function. Left ventricular endocardial border  not optimally defined to evaluate regional wall motion. There is mild left  ventricular hypertrophy. Left  ventricular diastolic parameters are indeterminate. Elevated left atrial  pressure.  3. Right ventricular systolic function is mildly reduced. The right  ventricular size is normal. There is normal pulmonary artery systolic  pressure. The estimated right ventricular systolic pressure is 45.0 mmHg.  Device wire present.  4. The mitral valve is grossly normal. Trivial mitral valve  regurgitation.  5. The aortic valve has been repaired/replaced. Aortic valve  regurgitation is not visualized. There is a 25 mm Edwards valve present in  the aortic position. Aortic valve mean gradient measures 26.0 mmHg. Mean  gradient was 23 mmHg in 2019, relatively  stable - could be suggestive of patient/prosthesis mismatch. Poorly  visualized.  6. The inferior vena cava is dilated in size with >50% respiratory  variability, suggesting right atrial pressure of 8 mmHg.   Cath 07/16/2017 (pre-AVR): 1. No angiographic evidence of CAD 2. Severe aortic stenosis by TEE/TTE. Due to turbulent flow in the ascending aorta and movement of the catheters, I could not cross the valve. The patient had a TEE this am that demonstrated severe AS. I did not feel that further attempts  at crossing the valve would provide any clinical data that would change his treatment plan, though more attempts increased the risk of procedure complication. 3. Normal right heart pressures  Recommendations: Will continue planning for AVR vs TAVR. Pt to f/u with Dr. Roxy Manns after CT scans.   )       Anesthesia Quick Evaluation

## 2021-06-30 ENCOUNTER — Telehealth: Payer: Self-pay

## 2021-06-30 ENCOUNTER — Other Ambulatory Visit: Payer: Self-pay

## 2021-06-30 ENCOUNTER — Ambulatory Visit (HOSPITAL_COMMUNITY): Payer: Medicare HMO | Admitting: Physical Therapy

## 2021-06-30 ENCOUNTER — Ambulatory Visit (HOSPITAL_COMMUNITY)
Admission: RE | Admit: 2021-06-30 | Discharge: 2021-06-30 | Disposition: A | Discharge status: Home / Self Care | Payer: Medicare HMO | Attending: Gastroenterology | Admitting: Gastroenterology

## 2021-06-30 ENCOUNTER — Encounter (HOSPITAL_COMMUNITY)
Admission: RE | Disposition: A | Discharge status: Home / Self Care | Payer: Self-pay | Source: Home / Self Care | Attending: Gastroenterology

## 2021-06-30 ENCOUNTER — Encounter (HOSPITAL_COMMUNITY): Payer: Self-pay | Admitting: Gastroenterology

## 2021-06-30 ENCOUNTER — Encounter (INDEPENDENT_AMBULATORY_CARE_PROVIDER_SITE_OTHER): Payer: Self-pay | Admitting: Gastroenterology

## 2021-06-30 DIAGNOSIS — D509 Iron deficiency anemia, unspecified: Secondary | ICD-10-CM

## 2021-06-30 DIAGNOSIS — I878 Other specified disorders of veins: Secondary | ICD-10-CM | POA: Diagnosis not present

## 2021-06-30 DIAGNOSIS — S81802S Unspecified open wound, left lower leg, sequela: Secondary | ICD-10-CM

## 2021-06-30 DIAGNOSIS — R600 Localized edema: Secondary | ICD-10-CM | POA: Diagnosis not present

## 2021-06-30 HISTORY — PX: GIVENS CAPSULE STUDY: SHX5432

## 2021-06-30 SURGERY — IMAGING PROCEDURE, GI TRACT, INTRALUMINAL, VIA CAPSULE

## 2021-06-30 NOTE — Telephone Encounter (Signed)
Called and spoke to [facility] form [facility] to obtain a cpap complaince report for patients OV tomorrow 07/01/2021, She states she is unable to obtain a newer DL than the one we received for patients last appt and that she will call patient to have him bring in his SD card to their office for a DL and will fax it over. She states that we cant pull DL report by patient bringing card to [place] since it isnt supported by [facility] [employer]. Will wait for DL from [facility] and give to Dr. Elsworth Soho when it is received.

## 2021-06-30 NOTE — Telephone Encounter (Signed)
Patient called and stated he was frustrated with having to bring SD card to [facility]. He states he is aggravated that we need a download before his visit and because oft that he is having to go out of his way to give his card to his DME. I voiced understanding and explained that the reason we cant get a DL here is because of our software and that it is a compatibility issue. Patient stated he was already aware of that. He states [facility] is going to see if they can get a wireless download after he reset his machine and if they can, he will still come in tomorrow. If they cannot and he needs to bring his SD card in, then he wants to cancel his appt. Offered for patient to come in for OV even if we cant obtain a DL and we could call him later on with results but patient refused. Patient states he would like a new CPAP machine. Larene Beach from [employer] states that will check with her supervisor to see if that's possible for patient.   Called [facility] at 4:09 pm and they were unable to get DL on patient wirelessly. Patient appt cancelled and rescheduled by [facility]. Nothing further needed.

## 2021-06-30 NOTE — Progress Notes (Signed)
Patient arrived to Endoscopy Department with [employer] camera without any blinking or appearance that it was even on. No screen, no blinking lights. Patient indicated he "thought may have been green, yellow but am color blind so I don't know , then the mail thing showed up".  No power noted to givens cameral or box.  Removed box and attempting to create video on holster in Endoscopy Department. Dr. Jenetta Downer notified verbally.

## 2021-06-30 NOTE — Therapy (Signed)
[place], [place], [address] Phone: [phone]   Fax:  [phone]  Wound Care Therapy  Patient Details  Name: Reginald [name] MRN: [id number] Date of Birth: 01-22-1971 Referring Provider (PT): Tula Nakayama   Encounter Date: 06/30/2021   PT End of Session - 06/30/21 1505     Visit Number 5    Number of Visits 8    Date for PT Re-Evaluation 07/17/21    Authorization Type [employer]    Authorization - Visit Number 5    Authorization - Number of Visits 8    Progress Note Due on Visit 8    PT Start Time 1136    PT Stop Time 1200    PT Time Calculation (min) 24 min    Activity Tolerance Patient tolerated treatment well    Behavior During Therapy [facility] for tasks assessed/performed             Past Medical History:  Diagnosis Date   Anemia    Anxiety    Aortic stenosis    Arthritis    Asthma    Back pain    Cellulitis of skin with lymphangitis    CHF (congestive heart failure) (HCC)    Chronic diastolic congestive heart failure (HCC)    Chronic venous insufficiency    Constipation    COPD (chronic obstructive pulmonary disease) ([place])    Depression    Diabetes ([place])    Dyspnea    Essential hypertension    Gout    Heart murmur    Hypertension    Morbid obesity ([place])    Obesity    Pneumonia    walking pneumonia   Prostatitis    Pulmonary embolism ([place])    Pulmonary hypertension ([place])    Pyelonephritis    S/P aortic valve replacement with bioprosthetic valve 08/23/2017   25 mm Edwards Inspiris Resilia stented bovine pericardial tissue valve   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   24 mm Hemashield supracoronary straight graft    Sleep apnea    cpap    Thoracic ascending aortic aneurysm    Thoracic ascending aortic aneurysm    Tobacco abuse     Past Surgical History:  Procedure Laterality Date   AORTIC VALVE REPLACEMENT N/A 08/23/2017   Procedure: AORTIC VALVE REPLACEMENT (AVR)  USING INSPIRIS RESILIA AORTIC VALVE SIZE 25 MM;  Surgeon: Rexene Alberts, MD;  Location: [place];  Service: Open Heart Surgery;  Laterality: N/A;   CARDIAC VALVE REPLACEMENT N/A    Phreesia 02/07/2020   COLONOSCOPY WITH PROPOFOL N/A 11/14/2020   Procedure: COLONOSCOPY WITH PROPOFOL;  Surgeon: Gatha Mayer, MD;  Location: [facility] ENDOSCOPY;  Service: Endoscopy;  Laterality: N/A;   ESOPHAGOGASTRODUODENOSCOPY (EGD) WITH PROPOFOL N/A 11/14/2020   Procedure: ESOPHAGOGASTRODUODENOSCOPY (EGD) WITH PROPOFOL;  Surgeon: Gatha Mayer, MD;  Location: [place];  Service: Endoscopy;  Laterality: N/A;   MULTIPLE EXTRACTIONS WITH ALVEOLOPLASTY N/A 07/23/2017   Procedure: Extraction of tooth #10 with alveoloplasty and gross debridement of remaining teeth;  Surgeon: Lenn Cal, DDS;  Location: [place];  Service: Oral Surgery;  Laterality: N/A;   PACEMAKER IMPLANT N/A 08/28/2017    St Jude Medical Assurity MRI conditional  dual-chamber pacemaker for symptomatic complete heart blockby Dr Rayann Heman   TEE WITHOUT CARDIOVERSION N/A 07/16/2017   Procedure: TRANSESOPHAGEAL ECHOCARDIOGRAM (TEE);  Surgeon: Lelon Perla, MD;  Location: [facility] ENDOSCOPY;  Service: Cardiovascular;  Laterality: N/A;   TEE WITHOUT CARDIOVERSION N/A 08/23/2017  Procedure: TRANSESOPHAGEAL ECHOCARDIOGRAM (TEE);  Surgeon: Rexene Alberts, MD;  Location: [place];  Service: Open Heart Surgery;  Laterality: N/A;   THORACIC AORTIC ANEURYSM REPAIR N/A 08/23/2017   Procedure: THORACIC ASCENDING ANEURYSM REPAIR (AAA) USING HEMASHIELD GOLD KNITTED MICROVEL DOUBLE VELOUR VASCULAR GRAFT D: 24 MM  L: 30 CM;  Surgeon: Rexene Alberts, MD;  Location: [place];  Service: Open Heart Surgery;  Laterality: N/A;    There were no vitals filed for this visit.               Wound Therapy - 06/30/21 1320     Subjective pt states he has to scrub down today for his surgery and request a bandage only on his Lt LE as he will also be admitted overnight.     Patient and Family Stated Goals Wound healed and leg swelling to be down    Date of Onset 05/20/21    Prior Treatments self care, antibiotics.    Pain Scale 0-10    Pain Score 0-No pain    Evaluation and Treatment Procedures Explained to Patient/Family Yes    Evaluation and Treatment Procedures agreed to    Wound Properties Date First Assessed: 06/17/21 Time First Assessed: 1410 Location: Leg Location Orientation: Left Present on Admission: Yes   Dressing Type Impregnated gauze (bismuth)    Dressing Changed Changed    Dressing Status Old drainage    Dressing Change Frequency PRN    Site / Wound Assessment Red;Pink    % Wound base Red or Granulating 60%    % Wound base Yellow/Fibrinous Exudate 40%    Wound Length (cm) 1 cm    Wound Width (cm) 0.5 cm    Wound Depth (cm) 0.1 cm    Wound Volume (cm^3) 0.05 cm^3    Wound Surface Area (cm^2) 0.5 cm^2    Margins Attached edges (approximated)    Drainage Amount Minimal    Drainage Description Serosanguineous    Treatment Cleansed;Debridement (Selective)    Selective Debridement - Location woundbed    Selective Debridement - Tools Used Forceps    Selective Debridement - Tissue Removed slough, and devitalized tissue.    Wound Therapy - Clinical Statement continued approximation and much improved granulation of wound bed this session.  Cleansed well and debrided remaining devitalized tissue from wound bed.  Used xerforom, 2X2 and gauze over wound this session and secured with medipore tape.  Assisted patient in ordering compression stockings and donning butler on [place].  Instructed to bring these with him next session to ensure proper fit and ability to don and doff.  To resume compression next session.    Wound Therapy - Functional Problem List difficult to bathe, dress, walk    Factors Delaying/Impairing Wound Healing Diabetes Mellitus;Altered sensation;Infection - systemic/local;Immobility;Multiple medical problems;Polypharmacy;Vascular  compromise    Hydrotherapy Plan Debridement;Dressing change;Patient/family education;Other (comment)   manual to decrease edema   Wound Therapy - Frequency 2X / week    Wound Therapy - Current Recommendations PT   4 weeks.  Discuss if pt is interested in lymphedema treatments   Wound Plan cleanse, moisturize, debridement, manual and compression bandaging.  Give information on compression stockings to order from [employer] as well as butler.    Dressing  xerofor, Dagoberto Ligas                       PT Short Term Goals - 06/27/21 0914       PT SHORT TERM GOAL #  1   Title Pt wound to be 100% granulated and no larger than .7x .7    Time 2    Period Weeks    Status On-going    Target Date 07/01/21      PT SHORT TERM GOAL #2   Title PT to have lost 2 cm from 30and 20 cm measurements to create a wound healing environment.    Time 2    Period Weeks    Status Partially Met               PT Long Term Goals - 06/27/21 0915       PT LONG TERM GOAL #1   Title PT wound on his LT LE to be healed    Time 4    Period Weeks    Status On-going    Target Date 07/15/21      PT LONG TERM GOAL #2   Title PT to have and be using a compression pump    Time 4    Period Weeks    Status On-going      PT LONG TERM GOAL #4   Title PT to have and be wearing juxtfit to control edema in LE to prevent future wounds.    Time 4    Period Weeks    Status On-going                    Patient will benefit from skilled therapeutic intervention in order to improve the following deficits and impairments:     Visit Diagnosis: No diagnosis found.     Problem List Patient Active Problem List   Diagnosis Date Noted   Melena 06/13/2021   Wound cellulitis 06/09/2021   SOB (shortness of breath) 06/09/2021   Pain due to onychomycosis of toenails of both feet 05/02/2021   Diabetic neuropathy ([place]) 05/02/2021   Callus of foot 05/02/2021   BPH with obstruction/lower urinary  tract symptoms 04/05/2021   Acute on chronic diastolic CHF (congestive heart failure) ([place]) 03/25/2021   Morbid obesity with BMI of 60.0-69.9, adult ([place]) 03/25/2021   Mixed hyperlipidemia 03/24/2021   Hematuria 03/24/2021   Atrial fibrillation, chronic (HCC) 03/24/2021   Prolonged QT interval 03/24/2021   Iron deficiency anemia 02/21/2021   Constipation 02/21/2021   Rectal bleeding    Chronic blood loss anemia 11/12/2020   Gastrointestinal hemorrhage 10/08/2020   S/P placement of cardiac pacemaker 02/10/2020   Encounter for examination following treatment at hospital 02/10/2020   Anxiety 02/10/2020   DM2 (diabetes mellitus, type 2) ([place]) 02/10/2020   OSA on CPAP    History of pulmonary embolism 03/16/2019   Paroxysmal atrial fibrillation ([place]) 09/05/2018   COPD (chronic obstructive pulmonary disease) ([place]) 05/07/2018   Gastroesophageal reflux disease    Pulmonary embolism ([place]) 09/18/2017   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 08/23/2017   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   Pulmonary hypertension ([place])    Chronic periodontitis 07/18/2017   Morbid obesity ([place])    Essential hypertension    Tobacco abuse    Chronic heart failure with preserved ejection fraction (HFpEF) ([place])    Chronic venous insufficiency    Teena Irani, PTA/CLT, WTA [phone]  Teena Irani, PTA 06/30/2021, 3:06 PM  [facility] [address] [place], [place], [address] Phone: [phone]   Fax:  [phone]  Name: Reginald [name] MRN: [id number] Date of Birth: 02/18/1971

## 2021-06-30 NOTE — Progress Notes (Signed)
Jeralyn Bennett RN returned call from patient regarding Givens camera belt. Patient indicated the box has stopped blinking, that he is color blind and not sure what has happened. Patient instructed to return [facility] and camera to Endoscopy department to be checked.

## 2021-07-01 ENCOUNTER — Encounter (HOSPITAL_COMMUNITY): Payer: Self-pay | Admitting: Gastroenterology

## 2021-07-01 ENCOUNTER — Ambulatory Visit (HOSPITAL_COMMUNITY)
Admission: RE | Admit: 2021-07-01 | Discharge: 2021-07-02 | Disposition: A | Discharge status: Home / Self Care | Payer: Medicare HMO | Attending: Otolaryngology | Admitting: Otolaryngology

## 2021-07-01 ENCOUNTER — Other Ambulatory Visit: Payer: Self-pay

## 2021-07-01 ENCOUNTER — Ambulatory Visit: Payer: Medicare HMO | Admitting: Pulmonary Disease

## 2021-07-01 ENCOUNTER — Ambulatory Visit (HOSPITAL_BASED_OUTPATIENT_CLINIC_OR_DEPARTMENT_OTHER): Payer: Medicare HMO | Admitting: Physician Assistant

## 2021-07-01 ENCOUNTER — Ambulatory Visit (HOSPITAL_COMMUNITY): Payer: Medicare HMO | Admitting: Physician Assistant

## 2021-07-01 ENCOUNTER — Encounter (HOSPITAL_COMMUNITY)
Admission: RE | Disposition: A | Discharge status: Home / Self Care | Payer: Self-pay | Source: Home / Self Care | Attending: Otolaryngology

## 2021-07-01 DIAGNOSIS — J45909 Unspecified asthma, uncomplicated: Secondary | ICD-10-CM | POA: Diagnosis not present

## 2021-07-01 DIAGNOSIS — K219 Gastro-esophageal reflux disease without esophagitis: Secondary | ICD-10-CM | POA: Diagnosis not present

## 2021-07-01 DIAGNOSIS — G4733 Obstructive sleep apnea (adult) (pediatric): Secondary | ICD-10-CM | POA: Insufficient documentation

## 2021-07-01 DIAGNOSIS — Z952 Presence of prosthetic heart valve: Secondary | ICD-10-CM | POA: Insufficient documentation

## 2021-07-01 DIAGNOSIS — Z7901 Long term (current) use of anticoagulants: Secondary | ICD-10-CM | POA: Insufficient documentation

## 2021-07-01 DIAGNOSIS — J3489 Other specified disorders of nose and nasal sinuses: Secondary | ICD-10-CM | POA: Diagnosis not present

## 2021-07-01 DIAGNOSIS — Z79899 Other long term (current) drug therapy: Secondary | ICD-10-CM | POA: Insufficient documentation

## 2021-07-01 DIAGNOSIS — J343 Hypertrophy of nasal turbinates: Secondary | ICD-10-CM

## 2021-07-01 DIAGNOSIS — F419 Anxiety disorder, unspecified: Secondary | ICD-10-CM | POA: Diagnosis not present

## 2021-07-01 DIAGNOSIS — Z9889 Other specified postprocedural states: Secondary | ICD-10-CM | POA: Diagnosis present

## 2021-07-01 DIAGNOSIS — Z7984 Long term (current) use of oral hypoglycemic drugs: Secondary | ICD-10-CM | POA: Diagnosis not present

## 2021-07-01 DIAGNOSIS — J449 Chronic obstructive pulmonary disease, unspecified: Secondary | ICD-10-CM | POA: Insufficient documentation

## 2021-07-01 DIAGNOSIS — I509 Heart failure, unspecified: Secondary | ICD-10-CM | POA: Diagnosis not present

## 2021-07-01 DIAGNOSIS — I110 Hypertensive heart disease with heart failure: Secondary | ICD-10-CM | POA: Insufficient documentation

## 2021-07-01 DIAGNOSIS — M1990 Unspecified osteoarthritis, unspecified site: Secondary | ICD-10-CM | POA: Diagnosis not present

## 2021-07-01 DIAGNOSIS — F32A Depression, unspecified: Secondary | ICD-10-CM | POA: Insufficient documentation

## 2021-07-01 DIAGNOSIS — Z87891 Personal history of nicotine dependence: Secondary | ICD-10-CM | POA: Diagnosis not present

## 2021-07-01 DIAGNOSIS — J342 Deviated nasal septum: Secondary | ICD-10-CM | POA: Insufficient documentation

## 2021-07-01 DIAGNOSIS — Z950 Presence of cardiac pacemaker: Secondary | ICD-10-CM | POA: Insufficient documentation

## 2021-07-01 DIAGNOSIS — Z6841 Body Mass Index (BMI) 40.0 and over, adult: Secondary | ICD-10-CM | POA: Insufficient documentation

## 2021-07-01 DIAGNOSIS — E119 Type 2 diabetes mellitus without complications: Secondary | ICD-10-CM | POA: Insufficient documentation

## 2021-07-01 HISTORY — PX: NASAL SEPTOPLASTY W/ TURBINOPLASTY: SHX2070

## 2021-07-01 LAB — GLUCOSE, CAPILLARY
Glucose-Capillary: 113 mg/dL — ABNORMAL HIGH (ref 70–99)
Glucose-Capillary: 97 mg/dL (ref 70–99)
Glucose-Capillary: 140 mg/dL — ABNORMAL HIGH (ref 70–99)
Glucose-Capillary: 161 mg/dL — ABNORMAL HIGH (ref 70–99)

## 2021-07-01 SURGERY — SEPTOPLASTY, NOSE, WITH NASAL TURBINATE REDUCTION
Anesthesia: General | Site: Nose | Laterality: Bilateral

## 2021-07-01 MED ORDER — INSULIN ASPART 100 UNIT/ML IJ SOLN: 0.0000 [IU] | INTRAMUSCULAR | Status: DC | PRN

## 2021-07-01 MED ORDER — HYDROMORPHONE HCL 1 MG/ML IJ SOLN
INTRAMUSCULAR | Status: AC
  Filled 2021-07-01: qty 1

## 2021-07-01 MED ORDER — METFORMIN HCL 850 MG PO TABS
850.0000 mg | ORAL_TABLET | Freq: Two times a day (BID) | ORAL | Status: DC
  Administered 2021-07-02: 850 mg via ORAL
  Filled 2021-07-01 (×3): qty 1

## 2021-07-01 MED ORDER — PROPOFOL 10 MG/ML IV BOLUS
INTRAVENOUS | Status: DC | PRN
  Administered 2021-07-01: 200 mg via INTRAVENOUS

## 2021-07-01 MED ORDER — SUGAMMADEX SODIUM 200 MG/2ML IV SOLN
INTRAVENOUS | Status: DC | PRN
  Administered 2021-07-01: 500 mg via INTRAVENOUS

## 2021-07-01 MED ORDER — BACITRACIN ZINC 500 UNIT/GM EX OINT
TOPICAL_OINTMENT | CUTANEOUS | Status: DC | PRN
Start: 2021-07-01 — End: 2021-07-01
  Administered 2021-07-01: 1 via TOPICAL

## 2021-07-01 MED ORDER — ATORVASTATIN CALCIUM 40 MG PO TABS
40.0000 mg | ORAL_TABLET | Freq: Every day | ORAL | Status: DC
  Administered 2021-07-02: 40 mg via ORAL
  Filled 2021-07-01: qty 1

## 2021-07-01 MED ORDER — PHENYLEPHRINE HCL-NACL 20-0.9 MG/250ML-% IV SOLN
INTRAVENOUS | Status: DC | PRN
Start: 2021-07-01 — End: 2021-07-01
  Administered 2021-07-01 (×2): 20 ug/min via INTRAVENOUS

## 2021-07-01 MED ORDER — UMECLIDINIUM BROMIDE 62.5 MCG/ACT IN AEPB
1.0000 | INHALATION_SPRAY | Freq: Every day | RESPIRATORY_TRACT | Status: DC
  Filled 2021-07-01: qty 7

## 2021-07-01 MED ORDER — MIDAZOLAM HCL 2 MG/2ML IJ SOLN
INTRAMUSCULAR | Status: AC
  Filled 2021-07-01: qty 2

## 2021-07-01 MED ORDER — MIDAZOLAM HCL 2 MG/2ML IJ SOLN
INTRAMUSCULAR | Status: DC | PRN
Start: 2021-07-01 — End: 2021-07-01
  Administered 2021-07-01: 2 mg via INTRAVENOUS

## 2021-07-01 MED ORDER — MORPHINE SULFATE (PF) 2 MG/ML IV SOLN: 2.0000 mg | INTRAVENOUS | Status: DC | PRN

## 2021-07-01 MED ORDER — ONDANSETRON HCL 4 MG/2ML IJ SOLN
INTRAMUSCULAR | Status: AC
  Filled 2021-07-01: qty 2

## 2021-07-01 MED ORDER — FENTANYL CITRATE (PF) 250 MCG/5ML IJ SOLN
INTRAMUSCULAR | Status: DC | PRN
  Administered 2021-07-01: 50 ug via INTRAVENOUS

## 2021-07-01 MED ORDER — OXYMETAZOLINE HCL 0.05 % NA SOLN
NASAL | Status: DC | PRN
  Administered 2021-07-01: 1

## 2021-07-01 MED ORDER — TAMSULOSIN HCL 0.4 MG PO CAPS
0.4000 mg | ORAL_CAPSULE | Freq: Every day | ORAL | Status: DC
  Administered 2021-07-02: 0.4 mg via ORAL
  Filled 2021-07-01: qty 1

## 2021-07-01 MED ORDER — LACTATED RINGERS IV SOLN: INTRAVENOUS | Status: DC

## 2021-07-01 MED ORDER — DOCUSATE SODIUM 100 MG PO CAPS
200.0000 mg | ORAL_CAPSULE | Freq: Every day | ORAL | Status: DC
  Administered 2021-07-01 – 2021-07-02 (×2): 200 mg via ORAL
  Filled 2021-07-01 (×2): qty 2

## 2021-07-01 MED ORDER — 0.9 % SODIUM CHLORIDE (POUR BTL) OPTIME
TOPICAL | Status: DC | PRN
  Administered 2021-07-01: 1000 mL

## 2021-07-01 MED ORDER — LIDOCAINE 2% (20 MG/ML) 5 ML SYRINGE
INTRAMUSCULAR | Status: AC
  Filled 2021-07-01: qty 5

## 2021-07-01 MED ORDER — TORSEMIDE 20 MG PO TABS
40.0000 mg | ORAL_TABLET | Freq: Two times a day (BID) | ORAL | Status: DC
  Administered 2021-07-01 – 2021-07-02 (×2): 40 mg via ORAL
  Filled 2021-07-01 (×5): qty 2

## 2021-07-01 MED ORDER — ACETAMINOPHEN 500 MG PO TABS
1000.0000 mg | ORAL_TABLET | Freq: Once | ORAL | Status: AC
  Administered 2021-07-01: 1000 mg via ORAL
  Filled 2021-07-01: qty 2

## 2021-07-01 MED ORDER — PROPOFOL 10 MG/ML IV BOLUS
INTRAVENOUS | Status: AC
  Filled 2021-07-01: qty 20

## 2021-07-01 MED ORDER — OXYMETAZOLINE HCL 0.05 % NA SOLN
NASAL | Status: AC
  Filled 2021-07-01: qty 30

## 2021-07-01 MED ORDER — METOPROLOL TARTRATE 100 MG PO TABS
100.0000 mg | ORAL_TABLET | Freq: Two times a day (BID) | ORAL | Status: DC
Start: 2021-07-01 — End: 2021-07-02
  Administered 2021-07-01 – 2021-07-02 (×2): 100 mg via ORAL
  Filled 2021-07-01 (×3): qty 1

## 2021-07-01 MED ORDER — TIOTROPIUM BROMIDE MONOHYDRATE 2.5 MCG/ACT IN AERS: 2.0000 | INHALATION_SPRAY | Freq: Every day | RESPIRATORY_TRACT | Status: DC

## 2021-07-01 MED ORDER — FERROUS SULFATE 325 (65 FE) MG PO TABS
325.0000 mg | ORAL_TABLET | Freq: Every day | ORAL | Status: DC
  Administered 2021-07-02: 325 mg via ORAL
  Filled 2021-07-01 (×2): qty 1

## 2021-07-01 MED ORDER — ALLOPURINOL 300 MG PO TABS
300.0000 mg | ORAL_TABLET | Freq: Every day | ORAL | Status: DC
  Administered 2021-07-02: 300 mg via ORAL
  Filled 2021-07-01: qty 1

## 2021-07-01 MED ORDER — EMPAGLIFLOZIN 10 MG PO TABS
10.0000 mg | ORAL_TABLET | Freq: Every day | ORAL | Status: DC
  Administered 2021-07-02: 10 mg via ORAL
  Filled 2021-07-01 (×2): qty 1

## 2021-07-01 MED ORDER — LISINOPRIL 20 MG PO TABS
20.0000 mg | ORAL_TABLET | Freq: Every day | ORAL | Status: DC
  Administered 2021-07-02: 20 mg via ORAL
  Filled 2021-07-01 (×2): qty 1

## 2021-07-01 MED ORDER — EPHEDRINE SULFATE-NACL 50-0.9 MG/10ML-% IV SOSY
PREFILLED_SYRINGE | INTRAVENOUS | Status: DC | PRN
  Administered 2021-07-01: 10 mg via INTRAVENOUS

## 2021-07-01 MED ORDER — DEXTROSE 5 % IV SOLN
INTRAVENOUS | Status: DC | PRN
  Administered 2021-07-01: 3 g via INTRAVENOUS

## 2021-07-01 MED ORDER — SEMAGLUTIDE 14 MG PO TABS: 14.0000 mg | ORAL_TABLET | Freq: Every morning | ORAL | Status: DC

## 2021-07-01 MED ORDER — LIDOCAINE-EPINEPHRINE 1 %-1:100000 IJ SOLN
INTRAMUSCULAR | Status: AC
  Filled 2021-07-01: qty 1

## 2021-07-01 MED ORDER — PANTOPRAZOLE SODIUM 40 MG PO TBEC
40.0000 mg | DELAYED_RELEASE_TABLET | Freq: Every day | ORAL | Status: DC
  Administered 2021-07-02: 40 mg via ORAL
  Filled 2021-07-01 (×2): qty 1

## 2021-07-01 MED ORDER — CHLORHEXIDINE GLUCONATE 0.12 % MT SOLN
15.0000 mL | Freq: Once | OROMUCOSAL | Status: AC
  Administered 2021-07-01: 15 mL via OROMUCOSAL
  Filled 2021-07-01: qty 15

## 2021-07-01 MED ORDER — LIDOCAINE 2% (20 MG/ML) 5 ML SYRINGE
INTRAMUSCULAR | Status: DC | PRN
  Administered 2021-07-01: 100 mg via INTRAVENOUS
  Administered 2021-07-01: 60 mg via INTRAVENOUS

## 2021-07-01 MED ORDER — LIDOCAINE-EPINEPHRINE 1 %-1:100000 IJ SOLN
INTRAMUSCULAR | Status: DC | PRN
  Administered 2021-07-01: 5.5 mL

## 2021-07-01 MED ORDER — MUPIROCIN 2 % EX OINT
TOPICAL_OINTMENT | CUTANEOUS | Status: AC
  Filled 2021-07-01: qty 22

## 2021-07-01 MED ORDER — GABAPENTIN 100 MG PO CAPS
100.0000 mg | ORAL_CAPSULE | Freq: Every day | ORAL | Status: DC
  Administered 2021-07-02: 100 mg via ORAL
  Filled 2021-07-01: qty 1

## 2021-07-01 MED ORDER — PHENYLEPHRINE 40 MCG/ML (10ML) SYRINGE FOR IV PUSH (FOR BLOOD PRESSURE SUPPORT)
PREFILLED_SYRINGE | INTRAVENOUS | Status: DC | PRN
  Administered 2021-07-01: 120 ug via INTRAVENOUS
  Administered 2021-07-01: 160 ug via INTRAVENOUS
  Administered 2021-07-01 (×3): 120 ug via INTRAVENOUS

## 2021-07-01 MED ORDER — OXYCODONE-ACETAMINOPHEN 5-325 MG PO TABS
1.0000 | ORAL_TABLET | ORAL | Status: DC | PRN
  Administered 2021-07-01 – 2021-07-02 (×2): 2 via ORAL
  Filled 2021-07-01 (×2): qty 2

## 2021-07-01 MED ORDER — PHENYLEPHRINE 40 MCG/ML (10ML) SYRINGE FOR IV PUSH (FOR BLOOD PRESSURE SUPPORT)
PREFILLED_SYRINGE | INTRAVENOUS | Status: AC
  Filled 2021-07-01: qty 10

## 2021-07-01 MED ORDER — CHOLECALCIFEROL 125 MCG (5000 UT) PO TABS: 5000.0000 [IU] | ORAL_TABLET | Freq: Every day | ORAL | Status: DC

## 2021-07-01 MED ORDER — ACETAMINOPHEN 325 MG PO TABS: 650.0000 mg | ORAL_TABLET | Freq: Four times a day (QID) | ORAL | Status: DC | PRN

## 2021-07-01 MED ORDER — SUCCINYLCHOLINE CHLORIDE 200 MG/10ML IV SOSY
PREFILLED_SYRINGE | INTRAVENOUS | Status: DC | PRN
  Administered 2021-07-01: 160 mg via INTRAVENOUS

## 2021-07-01 MED ORDER — KCL IN DEXTROSE-NACL 20-5-0.45 MEQ/L-%-% IV SOLN
INTRAVENOUS | Status: DC
  Filled 2021-07-01: qty 1000

## 2021-07-01 MED ORDER — FENTANYL CITRATE (PF) 100 MCG/2ML IJ SOLN
INTRAMUSCULAR | Status: AC
  Filled 2021-07-01: qty 2

## 2021-07-01 MED ORDER — GLIPIZIDE ER 5 MG PO TB24
5.0000 mg | ORAL_TABLET | Freq: Every day | ORAL | Status: DC
  Administered 2021-07-02: 5 mg via ORAL
  Filled 2021-07-01: qty 1

## 2021-07-01 MED ORDER — GABAPENTIN 100 MG PO CAPS
200.0000 mg | ORAL_CAPSULE | Freq: Every day | ORAL | Status: DC
  Administered 2021-07-01: 200 mg via ORAL
  Filled 2021-07-01 (×2): qty 2

## 2021-07-01 MED ORDER — FENTANYL CITRATE (PF) 100 MCG/2ML IJ SOLN
25.0000 ug | INTRAMUSCULAR | Status: DC | PRN
  Administered 2021-07-01 (×2): 50 ug via INTRAVENOUS

## 2021-07-01 MED ORDER — ROCURONIUM BROMIDE 10 MG/ML (PF) SYRINGE
PREFILLED_SYRINGE | INTRAVENOUS | Status: DC | PRN
Start: 2021-07-01 — End: 2021-07-01
  Administered 2021-07-01 (×2): 50 mg via INTRAVENOUS

## 2021-07-01 MED ORDER — VITAMIN D 25 MCG (1000 UNIT) PO TABS
5000.0000 [IU] | ORAL_TABLET | Freq: Every day | ORAL | Status: DC
  Administered 2021-07-02: 5000 [IU] via ORAL
  Filled 2021-07-01: qty 5

## 2021-07-01 MED ORDER — BUPROPION HCL ER (XL) 150 MG PO TB24
300.0000 mg | ORAL_TABLET | Freq: Every day | ORAL | Status: DC
  Administered 2021-07-02: 300 mg via ORAL
  Filled 2021-07-01: qty 2

## 2021-07-01 MED ORDER — ORAL CARE MOUTH RINSE: 15.0000 mL | Freq: Once | OROMUCOSAL | Status: AC

## 2021-07-01 MED ORDER — HYDROMORPHONE HCL 1 MG/ML IJ SOLN
0.2500 mg | INTRAMUSCULAR | Status: DC | PRN
  Administered 2021-07-01: 0.25 mg via INTRAVENOUS

## 2021-07-01 MED ORDER — FENTANYL CITRATE (PF) 250 MCG/5ML IJ SOLN
INTRAMUSCULAR | Status: AC
  Filled 2021-07-01: qty 5

## 2021-07-01 MED ORDER — CEFAZOLIN IN SODIUM CHLORIDE 3-0.9 GM/100ML-% IV SOLN
INTRAVENOUS | Status: AC
  Filled 2021-07-01: qty 100

## 2021-07-01 MED ORDER — ONDANSETRON HCL 4 MG/2ML IJ SOLN
INTRAMUSCULAR | Status: DC | PRN
  Administered 2021-07-01: 4 mg via INTRAVENOUS

## 2021-07-01 MED ORDER — ROCURONIUM BROMIDE 10 MG/ML (PF) SYRINGE
PREFILLED_SYRINGE | INTRAVENOUS | Status: AC
  Filled 2021-07-01: qty 10

## 2021-07-01 SURGICAL SUPPLY — 28 items
BAG COUNTER SPONGE SURGICOUNT (BAG) ×2 IMPLANT
BAG SPNG CNTER NS LX DISP (BAG) ×1
CANISTER SUCT 3000ML PPV (MISCELLANEOUS) ×2 IMPLANT
COAGULATOR SUCT SWTCH 10FR 6 (ELECTROSURGICAL) ×2 IMPLANT
COVER SURGICAL LIGHT HANDLE (MISCELLANEOUS) ×1 IMPLANT
DRAPE HALF SHEET 40X57 (DRAPES) IMPLANT
ELECT REM PT RETURN 9FT ADLT (ELECTROSURGICAL) ×2
ELECTRODE REM PT RTRN 9FT ADLT (ELECTROSURGICAL) ×1 IMPLANT
GAUZE SPONGE 2X2 8PLY STRL LF (GAUZE/BANDAGES/DRESSINGS) ×1 IMPLANT
GLOVE SURG LTX SZ7.5 (GLOVE) ×2 IMPLANT
GOWN STRL REUS W/ TWL LRG LVL3 (GOWN DISPOSABLE) ×2 IMPLANT
GOWN STRL REUS W/TWL LRG LVL3 (GOWN DISPOSABLE) ×4
KIT BASIN OR (CUSTOM PROCEDURE TRAY) ×2 IMPLANT
KIT TURNOVER KIT B (KITS) ×2 IMPLANT
NDL HYPO 25GX1X1/2 BEV (NEEDLE) ×1 IMPLANT
NEEDLE HYPO 25GX1X1/2 BEV (NEEDLE) ×2 IMPLANT
NS IRRIG 1000ML POUR BTL (IV SOLUTION) ×2 IMPLANT
PAD ARMBOARD 7.5X6 YLW CONV (MISCELLANEOUS) ×4 IMPLANT
SPLINT NASAL AIRWAY SILICONE (MISCELLANEOUS) IMPLANT
SPLINT NASAL DOYLE BI-VL (GAUZE/BANDAGES/DRESSINGS) ×2 IMPLANT
SPONGE GAUZE 2X2 STER 10/PKG (GAUZE/BANDAGES/DRESSINGS)
SPONGE NEURO XRAY DETECT 1X3 (DISPOSABLE) ×2 IMPLANT
SUT CHROMIC 4 0 SH 27 (SUTURE) ×2 IMPLANT
SUT PLAIN 4 0 ~~LOC~~ 1 (SUTURE) ×2 IMPLANT
SUT PROLENE 3 0 PS 2 (SUTURE) ×2 IMPLANT
TRAY ENT MC OR (CUSTOM PROCEDURE TRAY) ×2 IMPLANT
TUBE SALEM SUMP 16 FR W/ARV (TUBING) IMPLANT
TUBING EXTENTION W/L.L. (IV SETS) IMPLANT

## 2021-07-01 NOTE — Progress Notes (Signed)
Pt nose bleed after sneezing small amounr MD made aware Will continue to monitor

## 2021-07-01 NOTE — H&P (Signed)
Cc: Chronic nasal obstruction  HPI: The patient is a 51 year old male who returns today for his follow-up evaluation.  The patient was last seen 1 month ago.  At that time, he was noted to have severe nasal congestion, nasal septal deviation, and bilateral inferior turbinate hypertrophy.  He was treated with prednisone, Flonase, and nasal saline irrigation.  The patient returns today complaining of persistent nasal obstruction.  His symptoms are worse at night.  He has obstructive sleep apnea, and is currently having difficulty using his CPAP machine due to his nasal obstruction.   Exam: General: Communicates without difficulty, morbidly obese, no acute distress. Head: Normocephalic, no evidence injury, no tenderness, facial buttresses intact without stepoff. Eyes: PERRL, EOMI. No scleral icterus, conjunctivae clear. Neuro: CN II exam reveals vision grossly intact.  No nystagmus at any point of gaze. Ears: Auricles well formed without lesions.  Ear canals are intact without mass or lesion.  No erythema or edema is appreciated.  The TMs are intact without fluid. Nose: External evaluation reveals normal support and skin without lesions.  Dorsum is intact.  Anterior rhinoscopy reveals congested and edematous mucosa over anterior aspect of the inferior turbinates and nasal septum.  No purulence is noted. Middle meatus is not well visualized. Oral:  Oral cavity and oropharynx are intact, symmetric, without erythema or edema.  Mucosa is moist without lesions. Neck: Full range of motion without pain.  There is no significant lymphadenopathy.  No masses palpable.  Thyroid bed within normal limits to palpation.  Parotid glands and submandibular glands equal bilaterally without mass.  Trachea is midline. Neuro:  CN 2-12 grossly intact. Gait normal. Vestibular: No nystagmus at any point of gaze. A flexible scope was inserted into the right nasal cavity.  Endoscopy of the interior nasal cavity, superior, inferior, and  middle meatus was performed. The sphenoid-ethmoid recess was examined. Edematous mucosa was noted.  No polyp, mass, or lesion was appreciated. Nasal septal deviation noted.  Olfactory cleft was clear.  Nasopharynx was clear.  Turbinates were hypertrophied but without mass. The procedure was repeated on the contralateral side with similar findings.  The patient tolerated the procedure well.  Assessment: 1.  Persistent severe nasal obstruction, secondary to nasal mucosal congestion, nasal septal deviation, and bilateral inferior turbinate hypertrophy. 2.  The patient has not responded to medical treatment.  Plan: 1.  The nasal endoscopy findings are reviewed with the patient. 2.  Continue with Flonase nasal spray daily and nasal saline irrigation. 3.  In light of his persistent symptoms, he may benefit from undergoing surgical intervention with septoplasty and turbinate reduction.  The risk, benefits, alternatives, and details of the procedures are discussed. 4.  The patient would like to proceed with the procedures.

## 2021-07-01 NOTE — Procedures (Addendum)
Small Bowel Givens Capsule Study Procedure date:  06/30/2021  Referring Provider:  PCP PCP:  Dr. Posey Pronto, Colin Broach, MD  Indication for procedure:  Iron deficiency anemia  Findings:  Adequate study as there was presence of adequate prep and the endoscopic capsule reached the cecum.  There was presence of mild nodularity of the mid small bowel between 1:28 and 1:34  there was no presence of hematin in the GI tract or presence of vascular lesions, no ulcers.  Stool in the colon was green.  First Gastric image:  00:02:16 First Duodenal image: 00:27:53 First Cecal image: 03:24:56 Gastric Passage time: 0h 41m Small Bowel Passage time:  2h 62m     Summary & Recommendations: There was presence of mild nodularity of the mid small bowel between 1:28 and 1:34  there was no presence of hematin in the GI tract or presence of vascular lesions, no ulcers.  Stool in the colon was green.  No alterations were found that could explain his ongoing fluctuating anemia.  However, I explained that the area of nodularity could be related to artifact given his BMI versus an area that will need to be further evaluated with an anterograde double-balloon enteroscopy (rule out conditions such as lymphoma).  Patient agreed understood.  -Referral to [facility] for consideration of anterograde double-balloon enteroscopy. - Referral to hematology  I personally communicated these recommendations to the patient  [name], MD Gastroenterology and Hepatology [facility]

## 2021-07-01 NOTE — Op Note (Signed)
DATE OF PROCEDURE: 07/01/2021  OPERATIVE REPORT   SURGEON: Leta Baptist, MD   PREOPERATIVE DIAGNOSES:  1. Severe nasal septal deviation.  2. Bilateral inferior turbinate hypertrophy.  3. Chronic nasal obstruction.  POSTOPERATIVE DIAGNOSES:  1. Severe nasal septal deviation.  2. Bilateral inferior turbinate hypertrophy.  3. Chronic nasal obstruction.  PROCEDURE PERFORMED:  1. Septoplasty.  2. Bilateral partial inferior turbinate resection.   ANESTHESIA: General endotracheal tube anesthesia.   COMPLICATIONS: None.   ESTIMATED BLOOD LOSS: 150 mL.   INDICATION FOR PROCEDURE: Reginald [name] is a 51 y.o. male with a history of chronic nasal obstruction. The patient was treated with antihistamine, decongestant, and steroid nasal sprays. However, the patient continued to be symptomatic. On examination, the patient was noted to have bilateral severe inferior turbinate hypertrophy and significant nasal septal deviation, causing significant nasal obstruction. Based on the above findings, the decision was made for the patient to undergo the above-stated procedures. The risks, benefits, alternatives, and details of the procedures were discussed with the patient. Questions were invited and answered. Informed consent was obtained.   DESCRIPTION OF PROCEDURE: The patient was taken to the operating room and placed supine on the operating table. General endotracheal tube anesthesia was administered by the anesthesiologist. The patient was positioned, and prepped and draped in the standard fashion for nasal surgery. Pledgets soaked with Afrin were placed in both nasal cavities for decongestion. The pledgets were subsequently removed.   Examination of the nasal cavity revealed a severe nasal septal deviation. 1% lidocaine with 1:100,000 epinephrine was injected onto the nasal septum bilaterally. A hemitransfixion incision was made on the left side. The mucosal flap was carefully elevated on the left side. A  cartilaginous incision was made 1 cm superior to the caudal margin of the nasal septum. Mucosal flap was also elevated on the right side in the similar fashion. It should be noted that due to the severe septal deviation, the deviated portion of the cartilaginous and bony septum had to be removed in piecemeal fashion. Once the deviated portions were removed, a straight midline septum was achieved. The septum was then quilted with 4-0 plain gut sutures. The hemitransfixion incision was closed with interrupted 4-0 chromic sutures.   The inferior one half of both hypertrophied inferior turbinate was crossclamped with a Kelly clamp. The inferior one half of each inferior turbinate was then resected with a pair of cross cutting scissors. Hemostasis was achieved with a suction cautery device. Doyle splints were applied to the nasal septum.  The care of the patient was turned over to the anesthesiologist. The patient was awakened from anesthesia without difficulty. The patient was extubated and transferred to the recovery room in good condition.   OPERATIVE FINDINGS: Severe nasal septal deviation and bilateral inferior turbinate hypertrophy.   SPECIMEN: None.   FOLLOWUP CARE: The patient will be observed overnight in the hospital.  Reginald Rankin Raynelle Bring, MD

## 2021-07-01 NOTE — Anesthesia Procedure Notes (Addendum)
Procedure Name: Intubation Date/Time: 07/01/2021 1:49 PM Performed by: Vonna Drafts, CRNA Pre-anesthesia Checklist: Patient identified, Emergency Drugs available, Suction available and Patient being monitored Patient Re-evaluated:Patient Re-evaluated prior to induction Oxygen Delivery Method: Circle system utilized Preoxygenation: Pre-oxygenation with 100% oxygen Induction Type: IV induction Laryngoscope Size: Glidescope and 4 Grade View: Grade I Tube type: Oral Tube size: 7.5 mm Number of attempts: 1 Airway Equipment and Method: Stylet and Oral airway Placement Confirmation: ETT inserted through vocal cords under direct vision, positive ETCO2 and breath sounds checked- equal and bilateral Secured at: 23 cm Tube secured with: Tape Dental Injury: Teeth and Oropharynx as per pre-operative assessment

## 2021-07-01 NOTE — Discharge Instructions (Signed)

## 2021-07-01 NOTE — Transfer of Care (Signed)
Immediate Anesthesia Transfer of Care Note  Patient: Reginald [name]  Procedure(s) Performed: NASAL SEPTOPLASTY WITH TURBINATE REDUCTION (Bilateral: Nose)  Patient Location: PACU  Anesthesia Type:General  Level of Consciousness: drowsy  Airway & Oxygen Therapy: Patient Spontanous Breathing and Patient connected to face mask oxygen  Post-op Assessment: Report given to RN and Post -op Vital signs reviewed and stable  Post vital signs: Reviewed and stable  Last Vitals:  Vitals Value Taken Time  BP 111/73 07/01/21 1518  Temp 36.1 C 07/01/21 1518  Pulse 76 07/01/21 1526  Resp 16 07/01/21 1526  SpO2 96 % 07/01/21 1526  Vitals shown include unvalidated device data.  Last Pain:  Vitals:   07/01/21 1116  TempSrc:   PainSc: 6       Patients Stated Pain Goal: 0 (37/36/68 1594)  Complications: No notable events documented.

## 2021-07-01 NOTE — Progress Notes (Signed)
Rt arrived with CPAP and pt had nose bleed. RT explained CPAP machine to pt and pt was confident in ability to place and turn on machine himself. RT will continue to monitor as needed.

## 2021-07-02 DIAGNOSIS — Z950 Presence of cardiac pacemaker: Secondary | ICD-10-CM | POA: Diagnosis not present

## 2021-07-02 DIAGNOSIS — J342 Deviated nasal septum: Secondary | ICD-10-CM | POA: Diagnosis not present

## 2021-07-02 DIAGNOSIS — Z79899 Other long term (current) drug therapy: Secondary | ICD-10-CM | POA: Diagnosis not present

## 2021-07-02 DIAGNOSIS — I509 Heart failure, unspecified: Secondary | ICD-10-CM | POA: Diagnosis not present

## 2021-07-02 DIAGNOSIS — J45909 Unspecified asthma, uncomplicated: Secondary | ICD-10-CM | POA: Diagnosis not present

## 2021-07-02 DIAGNOSIS — J449 Chronic obstructive pulmonary disease, unspecified: Secondary | ICD-10-CM | POA: Diagnosis not present

## 2021-07-02 DIAGNOSIS — J3489 Other specified disorders of nose and nasal sinuses: Secondary | ICD-10-CM | POA: Diagnosis not present

## 2021-07-02 DIAGNOSIS — I110 Hypertensive heart disease with heart failure: Secondary | ICD-10-CM | POA: Diagnosis not present

## 2021-07-02 DIAGNOSIS — J343 Hypertrophy of nasal turbinates: Secondary | ICD-10-CM | POA: Diagnosis not present

## 2021-07-02 LAB — GLUCOSE, CAPILLARY
Glucose-Capillary: 111 mg/dL — ABNORMAL HIGH (ref 70–99)
Glucose-Capillary: 117 mg/dL — ABNORMAL HIGH (ref 70–99)

## 2021-07-02 MED ORDER — AMOXICILLIN 875 MG PO TABS: 875.0000 mg | ORAL_TABLET | Freq: Two times a day (BID) | ORAL | 0 refills | Status: AC

## 2021-07-02 MED ORDER — OXYCODONE-ACETAMINOPHEN 5-325 MG PO TABS: 2.0000 | ORAL_TABLET | ORAL | 0 refills | Status: AC | PRN

## 2021-07-02 NOTE — Progress Notes (Signed)
Pt refuses bed alarm despite explained safety protocal

## 2021-07-02 NOTE — Anesthesia Postprocedure Evaluation (Signed)
Anesthesia Post Note  Patient: Reginald [name]  Procedure(s) Performed: NASAL SEPTOPLASTY WITH TURBINATE REDUCTION (Bilateral: Nose)     Patient location during evaluation: PACU Anesthesia Type: General Level of consciousness: awake and alert Pain management: pain level controlled Vital Signs Assessment: post-procedure vital signs reviewed and stable Respiratory status: spontaneous breathing, nonlabored ventilation, respiratory function stable and patient connected to nasal cannula oxygen Cardiovascular status: blood pressure returned to baseline and stable Postop Assessment: no apparent nausea or vomiting Anesthetic complications: no   No notable events documented.  Last Vitals:  Vitals:   07/02/21 0436 07/02/21 0744  BP: 124/86 133/86  Pulse: 77 74  Resp: 18 18  Temp: 36.9 C 36.6 C  SpO2: 100% 100%    Last Pain:  Vitals:   07/02/21 0744  TempSrc: Oral  PainSc:                  Santa Lighter

## 2021-07-02 NOTE — Progress Notes (Signed)
Patient discharge instructions including medications reviewed. All questions answered. PIV x1 removed with no issues. Patient discharged home via wheelchair with belongings.

## 2021-07-02 NOTE — Discharge Summary (Signed)
Physician Discharge Summary  Patient ID: SHADE KALEY MRN: [id number] DOB/AGE: Nov 22, 1970 51 y.o.  Admit date: 07/01/2021 Discharge date: 07/02/2021  Admission Diagnoses:  Discharge Diagnoses:  Principal Problem:   S/P nasal septoplasty   Discharged Condition: good  Hospital Course: Pt had an uneventful overnight stay. Had some postop bleeding. Used CPAP overnight without difficulty.  Consults: None  Significant Diagnostic Studies: None  Treatments: surgery: Septoplasty and turbinate reduction  Discharge Exam: Blood pressure 133/86, pulse 74, temperature 97.8 F (36.6 C), temperature source Oral, resp. rate 18, height '5\' 11"'  (1.803 m), weight (!) 219.5 kg, SpO2 100 %.    Disposition: Discharge disposition: 01-Home or Self Care       Discharge Instructions     Activity as tolerated - No restrictions   Complete by: As directed    Diet general   Complete by: As directed    No wound care   Complete by: As directed       Allergies as of 07/02/2021   No Known Allergies      Medication List     STOP taking these medications    Xarelto 20 MG Tabs tablet Generic drug: rivaroxaban       TAKE these medications    acetaminophen 325 MG tablet Commonly known as: TYLENOL Take 650 mg by mouth every 6 (six) hours as needed for mild pain.   albuterol (2.5 MG/3ML) 0.083% nebulizer solution Commonly known as: PROVENTIL INHALE 1 VIAL VIA NEBULIZER EVERY 6 HOURS AS NEEDED FOR WHEEZING OR SHORTNESS OF BREATH   albuterol 108 (90 Base) MCG/ACT inhaler Commonly known as: VENTOLIN HFA INHALE 2 PUFFS INTO THE LUNGS EVERY 6 (SIX) HOURS AS NEEDED FOR WHEEZING OR SHORTNESS OF BREATH.   allopurinol 300 MG tablet Commonly known as: ZYLOPRIM TAKE 1 TABLET EVERY DAY   amoxicillin 875 MG tablet Commonly known as: AMOXIL Take 1 tablet (875 mg total) by mouth 2 (two) times daily for 3 days.   atorvastatin 40 MG tablet Commonly known as: LIPITOR TAKE 1 TABLET EVERY DAY    buPROPion 300 MG 24 hr tablet Commonly known as: WELLBUTRIN XL Take 1 tablet (300 mg total) by mouth daily.   Cholecalciferol 125 MCG (5000 UT) Tabs Take 5,000 Units by mouth daily.   docusate sodium 100 MG capsule Commonly known as: COLACE Take 2 capsules (200 mg total) by mouth 2 (two) times daily. What changed: when to take this   empagliflozin 10 MG Tabs tablet Commonly known as: Jardiance Take 1 tablet (10 mg total) by mouth daily before breakfast.   FeroSul 325 (65 FE) MG tablet Generic drug: ferrous sulfate TAKE 1 TABLET EVERY DAY   fexofenadine 180 MG tablet Commonly known as: ALLEGRA Take 180 mg by mouth daily.   gabapentin 100 MG capsule Commonly known as: NEURONTIN TAKE 1 CAPSULE IN THE MORNING AND TAKE 2 CAPSULES IN THE EVENING   glipiZIDE 5 MG 24 hr tablet Commonly known as: GLUCOTROL XL Take 1 tablet by mouth once daily with breakfast   hydrOXYzine 25 MG tablet Commonly known as: ATARAX Take 1 tablet (25 mg total) by mouth every 8 (eight) hours as needed.   Krill Oil 350 MG Caps Take 350 mg by mouth daily.   lisinopril 20 MG tablet Commonly known as: ZESTRIL Take 1 tablet (20 mg total) by mouth daily.   metFORMIN 850 MG tablet Commonly known as: GLUCOPHAGE TAKE 1 TABLET TWICE DAILY WITH MEALS   metolazone 5 MG tablet Commonly known as: ZAROXOLYN  TAKE 1 TABLET (5 MG TOTAL) BY MOUTH THREE TIMES A WEEK. What changed:  how much to take how to take this when to take this additional instructions   metoprolol tartrate 100 MG tablet Commonly known as: LOPRESSOR TAKE 1 TABLET TWICE DAILY   oxyCODONE-acetaminophen 5-325 MG tablet Commonly known as: Percocet Take 2 tablets by mouth every 4 (four) hours as needed for up to 2 days for severe pain.   pantoprazole 40 MG tablet Commonly known as: PROTONIX TAKE 1 TABLET EVERY DAY   potassium chloride SA 20 MEQ tablet Commonly known as: KLOR-CON M TAKE 3 TABS DAILY AND TAKE AN ADDITIONAL 1 TAB ON  THE DAY YOU TAKE YOUR WEEKLY METOLAZONE DOSE   Rybelsus 14 MG Tabs Generic drug: Semaglutide Take 14 mg by mouth in the morning.   Spiriva Respimat 2.5 MCG/ACT Aers Generic drug: Tiotropium Bromide Monohydrate Inhale 2 puffs into the lungs daily.   sulfamethoxazole-trimethoprim 800-160 MG tablet Commonly known as: BACTRIM DS Take 1 tablet by mouth 2 (two) times daily.   tamsulosin 0.4 MG Caps capsule Commonly known as: FLOMAX Take 1 capsule (0.4 mg total) by mouth daily.   torsemide 20 MG tablet Commonly known as: DEMADEX Take 2 tablets (40 mg total) by mouth 2 (two) times daily.   True Metrix Blood Glucose Test test strip Generic drug: glucose blood TEST ONE TIME DAILY FOR DIABETES   True Metrix Meter w/Device Kit USE AS DIRECTED   TRUEplus Lancets 33G Misc USE TO TEST ONE TIME DAILY FOR DIABETES   TURMERIC-GINGER PO Take 2 each by mouth daily.        Follow-up Information     Leta Baptist, MD Follow up on 07/04/2021.   Specialty: Otolaryngology Why: at [employer] information: [place] [address] [phone]                 Signed: Burley Saver 07/02/2021, 11:37 AM

## 2021-07-03 ENCOUNTER — Encounter (HOSPITAL_COMMUNITY): Payer: Self-pay | Admitting: Otolaryngology

## 2021-07-05 ENCOUNTER — Telehealth: Payer: Self-pay | Admitting: Pulmonary Disease

## 2021-07-05 ENCOUNTER — Other Ambulatory Visit: Payer: Self-pay | Admitting: Internal Medicine

## 2021-07-05 ENCOUNTER — Other Ambulatory Visit: Payer: Self-pay

## 2021-07-05 ENCOUNTER — Ambulatory Visit (HOSPITAL_COMMUNITY): Payer: Medicare HMO | Admitting: Physical Therapy

## 2021-07-05 DIAGNOSIS — R600 Localized edema: Secondary | ICD-10-CM

## 2021-07-05 DIAGNOSIS — E1169 Type 2 diabetes mellitus with other specified complication: Secondary | ICD-10-CM | POA: Diagnosis not present

## 2021-07-05 DIAGNOSIS — D500 Iron deficiency anemia secondary to blood loss (chronic): Secondary | ICD-10-CM | POA: Diagnosis not present

## 2021-07-05 DIAGNOSIS — I878 Other specified disorders of veins: Secondary | ICD-10-CM

## 2021-07-05 DIAGNOSIS — S81802S Unspecified open wound, left lower leg, sequela: Secondary | ICD-10-CM

## 2021-07-05 NOTE — Therapy (Signed)
[place], [place], [address] Phone: [phone]   Fax:  [phone]  Wound Care Therapy  Patient Details  Name: Reginald [name] MRN: [id number] Date of Birth: [birth year] Referring Provider (PT): Tula Nakayama   Encounter Date: 07/05/2021   PT End of Session - 07/05/21 1252     Visit Number 6    Number of Visits 8    Date for PT Re-Evaluation 07/17/21    Authorization Type [employer]    Authorization - Visit Number 6    Authorization - Number of Visits 8    Progress Note Due on Visit 6    PT Start Time 1130    PT Stop Time 1208    PT Time Calculation (min) 38 min    Activity Tolerance Patient tolerated treatment well    Behavior During Therapy [facility] for tasks assessed/performed             Past Medical History:  Diagnosis Date   Anemia    Anxiety    Aortic stenosis    Arthritis    Asthma    Back pain    Cellulitis of skin with lymphangitis    CHF (congestive heart failure) (HCC)    Chronic diastolic congestive heart failure (HCC)    Chronic venous insufficiency    Constipation    COPD (chronic obstructive pulmonary disease) ([place])    Depression    Diabetes ([place])    Dyspnea    Essential hypertension    Gout    Heart murmur    Hypertension    Morbid obesity ([place])    Obesity    Pneumonia    walking pneumonia   Prostatitis    Pulmonary embolism ([place])    Pulmonary hypertension ([place])    Pyelonephritis    S/P aortic valve replacement with bioprosthetic valve 08/23/2017   25 mm Edwards Inspiris Resilia stented bovine pericardial tissue valve   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   24 mm Hemashield supracoronary straight graft    Sleep apnea    cpap    Thoracic ascending aortic aneurysm    Thoracic ascending aortic aneurysm    Tobacco abuse     Past Surgical History:  Procedure Laterality Date   AORTIC VALVE REPLACEMENT N/A 08/23/2017   Procedure: AORTIC VALVE REPLACEMENT (AVR)  USING INSPIRIS RESILIA AORTIC VALVE SIZE 25 MM;  Surgeon: Rexene Alberts, MD;  Location: [place];  Service: Open Heart Surgery;  Laterality: N/A;   CARDIAC VALVE REPLACEMENT N/A    [employer] 02/07/2020   COLONOSCOPY WITH PROPOFOL N/A 11/14/2020   Procedure: COLONOSCOPY WITH PROPOFOL;  Surgeon: Gatha Mayer, MD;  Location: [facility] ENDOSCOPY;  Service: Endoscopy;  Laterality: N/A;   ESOPHAGOGASTRODUODENOSCOPY (EGD) WITH PROPOFOL N/A 11/14/2020   Procedure: ESOPHAGOGASTRODUODENOSCOPY (EGD) WITH PROPOFOL;  Surgeon: Gatha Mayer, MD;  Location: [place];  Service: Endoscopy;  Laterality: N/A;   GIVENS CAPSULE STUDY N/A 06/30/2021   Procedure: GIVENS CAPSULE STUDY;  Surgeon: Harvel Quale, MD;  [facility];  Service: Gastroenterology;  Laterality: N/A;  7:30   MULTIPLE EXTRACTIONS WITH ALVEOLOPLASTY N/A 07/23/2017   Procedure: Extraction of tooth #10 with alveoloplasty and gross debridement of remaining teeth;  Surgeon: Lenn Cal, DDS;  Location: [place];  Service: Oral Surgery;  Laterality: N/A;   NASAL SEPTOPLASTY W/ TURBINOPLASTY Bilateral 07/01/2021   Procedure: NASAL SEPTOPLASTY WITH TURBINATE REDUCTION;  Surgeon: Leta Baptist, MD;  Location: [place];  Service: ENT;  Laterality: Bilateral;   PACEMAKER IMPLANT N/A 08/28/2017    St Jude Medical Assurity MRI conditional  dual-chamber pacemaker for symptomatic complete heart blockby Dr Rayann Heman   TEE WITHOUT CARDIOVERSION N/A 07/16/2017   Procedure: TRANSESOPHAGEAL ECHOCARDIOGRAM (TEE);  Surgeon: Lelon Perla, MD;  Location[facility] END[facility]COPY;  Service: Cardiovascular;  Laterality: N/A;   TEE WITHOUT CARDIOVERSION N/A 08/23/2017   Procedure: TRANSESOPHAGEAL ECHOCARDIOGRAM (TEE);  Surgeon: Rexene Alberts, MD;  Location: [place];  Service: Open Heart Surgery;  Laterality: N/A;   THORACIC AORTIC ANEURYSM REPAIR N/A 08/23/2017   Procedure: THORACIC ASCENDING ANEURYSM REPAIR (AAA) USING HEMASHIELD GOLD KNITTED MICROVEL DOUBLE VELOUR  VASCULAR GRAFT D: 24 MM  L: 30 CM;  Surgeon: Rexene Alberts, MD;  Location: [place];  Service: Open Heart Surgery;  Laterality: N/A;    There were no vitals filed for this visit.               Wound Therapy - 07/05/21 0001     Pain Scale 0-10    Pain Score 6     Pain Type Chronic pain    Pain Location Leg    Pain Orientation Left    Pain Descriptors / Indicators Throbbing    Pain Onset With Activity    Patients Stated Pain Goal 0    Pain Intervention(s) Emotional support    Wound Properties Date First Assessed: 06/17/21 Time First Assessed: 1410 Location: Leg Location Orientation: Left Present on Admission: Yes   Dressing Type None    Dressing Status None    Dressing Change Frequency Other (Comment)    Site / Wound Assessment Dusky    % Wound base Red or Granulating 50%    % Wound base Other/Granulation Tissue (Comment) 50%    Peri-wound Assessment Edema;Erythema (blanchable)    Wound Length (cm) 1 cm    Wound Width (cm) --   L shape with upper .2 lower .7 cm   Wound Depth (cm) 0.1 cm    Drainage Amount None    Treatment Cleansed;Debridement (Selective)   compression   Selective Debridement - Location wound bed    Selective Debridement - Tools Used Forceps    Selective Debridement - Tissue Removed devitalized tissue    Wound Therapy - Clinical Statement Pt has not been to treatment since last week at which time he requested not to have compression dressing placed on his leg as he felt when he went in for nasal surgery they would remove it.  Pt Lt LE induration has returned with all loss of fluid regained.  Pt states that he has obtained compression garment but is waiting for the butler as he is unable to don on his own.  Therapist gave pt LE lymphedema exercises and requested pt to try to complete them at least three times a day.    Factors Delaying/Impairing Wound Healing Immobility;Multiple medical problems;Vascular compromise    Hydrotherapy Plan Debridement;Dressing  change;Patient/family education    Wound Therapy - Frequency 2X / week    Wound Therapy - Current Recommendations PT    Wound Plan continue with debridement and dressing change work on Pt being able to don compression garment on I    Dressing  profore with extra cotton and foam to allow improved fit.                       PT Short Term Goals - 07/05/21 1256       PT SHORT TERM GOAL #1  Title Pt wound to be 100% granulated and no larger than .7x .7    Time 2    Period Weeks    Status On-going    Target Date 07/01/21      PT SHORT TERM GOAL #2   Title PT to have lost 2 cm from 30and 20 cm measurements to create a wound healing environment.    Time 2    Period Weeks    Status Partially Met               PT Long Term Goals - 06/27/21 0915       PT LONG TERM GOAL #1   Title PT wound on his LT LE to be healed    Time 4    Period Weeks    Status On-going    Target Date 07/15/21      PT LONG TERM GOAL #2   Title PT to have and be using a compression pump    Time 4    Period Weeks    Status On-going      PT LONG TERM GOAL #4   Title PT to have and be wearing juxtfit to control edema in LE to prevent future wounds.    Time 4    Period Weeks    Status On-going                   Plan - 07/05/21 1255     Clinical Impression Statement as above    Personal Factors and Comorbidities Comorbidity 3+;Finances;Fitness;Time since onset of injury/illness/exacerbation    Comorbidities CHF, CVI, COPD, DM, morbid obesity, aortic valve replacement with pacemaker    Examination-Activity Limitations Bathing;Dressing;Hygiene/Grooming;Locomotion Level;Stand    Examination-Participation Restrictions Community Activity;Shop;Meal Prep;Other    Stability/Clinical Decision Making Evolving/Moderate complexity    Rehab Potential Good    PT Frequency 2x / week    PT Duration 4 weeks    PT Treatment/Interventions Patient/family education;Other (comment);Manual  techniques   debridement and dressing change   PT Next Visit Plan assess edema; work on I of donning compression garment.             Patient will benefit from skilled therapeutic intervention in order to improve the following deficits and impairments:  Obesity, Pain, Decreased skin integrity, Increased edema  Visit Diagnosis: Localized edema  Leg wound, left, sequela  Chronic venous stasis     Problem List Patient Active Problem List   Diagnosis Date Noted   S/P nasal septoplasty 07/01/2021   Melena 06/13/2021   Wound cellulitis 06/09/2021   SOB (shortness of breath) 06/09/2021   Pain due to onychomycosis of toenails of both feet 05/02/2021   Diabetic neuropathy ([place]) 05/02/2021   Callus of foot 05/02/2021   BPH with obstruction/lower urinary tract symptoms 04/05/2021   Acute on chronic diastolic CHF (congestive heart failure) ([place]) 03/25/2021   Morbid obesity with BMI of 60.0-69.9, adult ([place]) 03/25/2021   Mixed hyperlipidemia 03/24/2021   Hematuria 03/24/2021   Atrial fibrillation, chronic (HCC) 03/24/2021   Prolonged QT interval 03/24/2021   Iron deficiency anemia 02/21/2021   Constipation 02/21/2021   Rectal bleeding    Chronic blood loss anemia 11/12/2020   Gastrointestinal hemorrhage 10/08/2020   S/P placement of cardiac pacemaker 02/10/2020   Encounter for examination following treatment at hospital 02/10/2020   Anxiety 02/10/2020   DM2 (diabetes mellitus, type 2) ([place]) 02/10/2020   OSA on CPAP    History of pulmonary embolism 03/16/2019   Paroxysmal atrial  fibrillation ([place]) 09/05/2018   COPD (chronic obstructive pulmonary disease) ([place]) 05/07/2018   Gastroesophageal reflux disease    Pulmonary embolism ([place]) 09/18/2017   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 08/23/2017   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   Pulmonary hypertension (HCC)    Chronic periodontitis 07/18/2017   Morbid  obesity ([place])    Essential hypertension    Tobacco abuse    Chronic heart failure with preserved ejection fraction (HFpEF) ([place])    Chronic venous insufficiency   Rayetta Humphrey, PT CLT [phone]  07/05/2021, 12:57 PM  [place] [address] [place], [place], [address] Phone: [phone]   Fax:  [phone]  Name: Reginald [name] MRN: [id number] Date of Birth: 1971-01-19

## 2021-07-05 NOTE — Telephone Encounter (Signed)
Advised patient we use [employer] airview software so machine would be compatible with our system to get a DL. Patient would like to know if we can place an order for him to get a new CPAP from the resmed company itself instead of having to use [facility]. Patient would like order today since he is getting a valentines special on machine and it is a one day deal.   Dr. Elsworth Soho please advise. Will  mark [employer] since patient would like order today.

## 2021-07-06 ENCOUNTER — Other Ambulatory Visit: Payer: Self-pay

## 2021-07-06 ENCOUNTER — Ambulatory Visit (INDEPENDENT_AMBULATORY_CARE_PROVIDER_SITE_OTHER): Payer: Medicare HMO | Admitting: Pharmacist

## 2021-07-06 ENCOUNTER — Encounter: Payer: Self-pay | Admitting: Pulmonary Disease

## 2021-07-06 DIAGNOSIS — I5032 Chronic diastolic (congestive) heart failure: Secondary | ICD-10-CM

## 2021-07-06 DIAGNOSIS — F339 Major depressive disorder, recurrent, unspecified: Secondary | ICD-10-CM

## 2021-07-06 DIAGNOSIS — F419 Anxiety disorder, unspecified: Secondary | ICD-10-CM

## 2021-07-06 DIAGNOSIS — G4733 Obstructive sleep apnea (adult) (pediatric): Secondary | ICD-10-CM

## 2021-07-06 DIAGNOSIS — I10 Essential (primary) hypertension: Secondary | ICD-10-CM

## 2021-07-06 DIAGNOSIS — E1169 Type 2 diabetes mellitus with other specified complication: Secondary | ICD-10-CM

## 2021-07-06 DIAGNOSIS — J449 Chronic obstructive pulmonary disease, unspecified: Secondary | ICD-10-CM

## 2021-07-06 DIAGNOSIS — Z9989 Dependence on other enabling machines and devices: Secondary | ICD-10-CM

## 2021-07-06 DIAGNOSIS — I480 Paroxysmal atrial fibrillation: Secondary | ICD-10-CM

## 2021-07-06 LAB — CMP14+EGFR
ALT: 20 IU/L (ref 0–44)
AST: 43 IU/L — ABNORMAL HIGH (ref 0–40)
Albumin/Globulin Ratio: 1.3 (ref 1.2–2.2)
Albumin: 4 g/dL (ref 4.0–5.0)
Alkaline Phosphatase: 90 IU/L (ref 44–121)
BUN/Creatinine Ratio: 16 (ref 9–20)
BUN: 19 mg/dL (ref 6–24)
Bilirubin Total: 1 mg/dL (ref 0.0–1.2)
CO2: 26 mmol/L (ref 20–29)
Calcium: 9.2 mg/dL (ref 8.7–10.2)
Chloride: 100 mmol/L (ref 96–106)
Creatinine, Ser: 1.2 mg/dL (ref 0.76–1.27)
Globulin, Total: 3.2 g/dL (ref 1.5–4.5)
Glucose: 82 mg/dL (ref 70–99)
Potassium: 4.4 mmol/L (ref 3.5–5.2)
Sodium: 143 mmol/L (ref 134–144)
Total Protein: 7.2 g/dL (ref 6.0–8.5)
eGFR: 74 mL/min/{1.73_m2} (ref >59)

## 2021-07-06 LAB — CBC WITH DIFFERENTIAL/PLATELET
Basophils Absolute: 0 10*3/uL (ref 0.0–0.2)
Basos: 0 %
EOS (ABSOLUTE): 0.3 10*3/uL (ref 0.0–0.4)
Eos: 3 %
Hematocrit: 32.2 % — ABNORMAL LOW (ref 37.5–51.0)
Hemoglobin: 10.2 g/dL — ABNORMAL LOW (ref 13.0–17.7)
Immature Grans (Abs): 0.1 10*3/uL (ref 0.0–0.1)
Immature Granulocytes: 1 %
Lymphocytes Absolute: 2.2 10*3/uL (ref 0.7–3.1)
Lymphs: 22 %
MCH: 25.1 pg — ABNORMAL LOW (ref 26.6–33.0)
MCHC: 31.7 g/dL (ref 31.5–35.7)
MCV: 79 fL (ref 79–97)
Monocytes Absolute: 0.6 10*3/uL (ref 0.1–0.9)
Monocytes: 6 %
Neutrophils Absolute: 6.8 10*3/uL (ref 1.4–7.0)
Neutrophils: 68 %
Platelets: 262 10*3/uL (ref 150–450)
RBC: 4.06 x10E6/uL — ABNORMAL LOW (ref 4.14–5.80)
RDW: 17.7 % — ABNORMAL HIGH (ref 11.6–15.4)
WBC: 10 10*3/uL (ref 3.4–10.8)

## 2021-07-06 NOTE — Telephone Encounter (Signed)
Patient called and states he sent a mychart message with all of the information regarding placing a new order for CPAP and where to send it to. Notified P[facility] and they are aware of where to send order. Nothing further needed.

## 2021-07-06 NOTE — Chronic Care Management (AMB) (Signed)
Chronic Care Management Pharmacy Note  07/06/2021 Name:  Reginald [name] MRN:  [id number] DOB:  March 12, 1971  Summary: Type 2 Diabetes Most recent A1c was 5.7% which is at goal of <7% per ADA guidelines Jardiance remains on hold per cardiology due to urinary tract infection  Patient is approved for Rybelsus through [employer] patient assistance program through 04/20/22 and Jardiance through [place] patient assistance program through 05/21/22 Patient denies recent hypoglycemia (lowest blood glucose 82) Current glucose readings over last week: fasting blood glucose mostly 130-140s Continue metformin 850 mg by mouth twice daily, glipizide XL 5 mg by mouth with breakfast, and semaglutide (Rybelsus) 14 mg daily Remain off Jardiance per cardiology. May consider restarting at some point. Patient expressed interest in weight loss today. Discussed bariatric surgery vs. pharmacologic. Patient is already on Rybelsus; however, may benefit from switch to Ozempic. Patient would like to discuss with primary care provider at next visit.   Heart failure with preserved ejection fraction Appropriately managed; Has pacemaker Other important details: serum creatinine improved, Jardiance currently on hold per cardiology due to urinary tract infection.  Current home weights: mostly stable 470-480s Remain off Jardiance per cardiology Continue beta blocker, ACE inhibitor, and diuretics + potassium repletion  Chronic Obstructive Pulmonary Disease Stable now status post nose surgery; Patient follows with Dr. Elsworth Soho for CPAP  Patient is approved for Spiriva through [place] patient assistance program through 05/21/22 Continue Spiriva 2 puffs once daily and albuterol as needed for shortness of breath    Subjective: Reginald [name] is an 51 y.o. year old male who is a primary patient of [name], MD.  The CCM team was consulted for assistance with disease management and care coordination needs.    Engaged with  patient by telephone for follow up visit in response to provider referral for pharmacy case management and/or care coordination services.   Consent to Services:  The patient was given information about Chronic Care Management services, agreed to services, and gave verbal consent prior to initiation of services.  Please see initial visit note for detailed documentation.   Patient Care Team: [name], MD as PCP - General (Internal Medicine) Thompson Grayer, MD as PCP - Electrophysiology (Cardiology) Josue Hector, MD as PCP - Cardiology (Cardiology) Kassie Mends, RN as [place] Management Beryle Lathe, [facility] (Pharmacist)  Objective:  Lab Results  Component Value Date   CREATININE 1.20 07/05/2021   CREATININE 1.88 (H) 06/28/2021   CREATININE 1.43 (H) 05/13/2021    Lab Results  Component Value Date   HGBA1C 5.7 (H) 06/28/2021   Last diabetic Eye exam: No results found for: HMDIABEYEEXA  Last diabetic Foot exam: No results found for: HMDIABFOOTEX      Component Value Date/Time   CHOL 97 03/25/2021 0539   CHOL 123 06/10/2020 0837   TRIG 138 03/25/2021 0539   HDL 27 (L) 03/25/2021 0539   HDL 25 (L) 06/10/2020 0837   CHOLHDL 3.6 03/25/2021 0539   VLDL 28 03/25/2021 0539   LDLCALC 42 03/25/2021 0539   LDLCALC 54 06/10/2020 0837    Hepatic Function Latest Ref Rng & Units 07/05/2021 05/13/2021 03/25/2021  Total Protein 6.0 - 8.5 g/dL 7.2 8.2 7.5  Albumin 4.0 - 5.0 g/dL 4.0 4.0 3.2(L)  AST 0 - 40 IU/L 43(H) 51(H) 32  ALT 0 - 44 IU/L '20 20 20  ' Alk Phosphatase 44 - 121 IU/L 90 103 75  Total Bilirubin 0.0 - 1.2 mg/dL  1.0 1.3(H) 1.9(H)  Bilirubin, Direct 0.0 - 0.2 mg/dL - - -    Lab Results  Component Value Date/Time   TSH 3.920 05/13/2021 10:01 AM   TSH 4.060 10/04/2020 09:43 AM   FREET4 1.30 10/04/2020 09:43 AM   FREET4 0.79 (L) 05/03/2018 02:04 PM    CBC Latest Ref Rng & Units 07/05/2021 06/28/2021 06/13/2021  WBC 3.4 - 10.8 x10E3/uL  10.0 10.8(H) 9.4  Hemoglobin 13.0 - 17.7 g/dL 10.2(L) 10.8(L) 11.9(L)  Hematocrit 37.5 - 51.0 % 32.2(L) 36.0(L) 37.3(L)  Platelets 150 - 450 x10E3/uL 262 351 254    Lab Results  Component Value Date/Time   VD25OH 24.8 (L) 05/13/2021 10:01 AM    Clinical ASCVD: No  The ASCVD Risk score (Arnett DK, et al., 2019) failed to calculate for the following reasons:   The valid total cholesterol range is 130 to 320 mg/dL    Social History   Tobacco Use  Smoking Status Former   Packs/day: 2.00   Years: 16.00   Pack years: 32.00   Types: Cigarettes   Quit date: 08/23/2017   Years since quitting: 3.8  Smokeless Tobacco Never   BP Readings from Last 3 Encounters:  07/02/21 133/86  06/28/21 110/60  06/17/21 110/78   Pulse Readings from Last 3 Encounters:  07/02/21 74  06/28/21 72  06/17/21 92   Wt Readings from Last 3 Encounters:  07/01/21 (!) 484 lb (219.5 kg)  06/30/21 (!) 485 lb (220 kg)  06/28/21 (!) 494 lb 1.6 oz (224.1 kg)    Assessment: Review of patient past medical history, allergies, medications, health status, including review of consultants reports, laboratory and other test data, was performed as part of comprehensive evaluation and provision of chronic care management services.   SDOH:  (Social Determinants of Health) assessments and interventions performed:    CCM Care Plan  No Known Allergies  Medications Reviewed Today     Reviewed by Beryle Lathe, [facility] (Pharmacist) on 07/06/21 at [phone]  Med List Status: <None>   Medication Order Taking? Sig Documenting Provider Last Dose Status Informant  acetaminophen (TYLENOL) 325 MG tablet [id number] Yes Take 650 mg by mouth every 6 (six) hours as needed for mild pain. [provider] Taking Active Self  albuterol (PROVENTIL) (2.5 MG/3ML) 0.083% nebulizer solution [id number] Yes INHALE 1 VIAL VIA NEBULIZER EVERY 6 HOURS AS NEEDED FOR WHEEZING OR SHORTNESS OF BREATH [name], MD Taking Active Self   albuterol (VENTOLIN HFA) 108 (90 Base) MCG/ACT inhaler [id number] Yes INHALE 2 PUFFS INTO THE LUNGS EVERY 6 (SIX) HOURS AS NEEDED FOR WHEEZING OR SHORTNESS OF BREATH. [name], MD Taking Active Self  allopurinol (ZYLOPRIM) 300 MG tablet [id number] Yes TAKE 1 TABLET EVERY DAY [name], MD Taking Active Self  atorvastatin (LIPITOR) 40 MG tablet [id number] Yes TAKE 1 TABLET EVERY DAY [name], MD Taking Active Self  Blood Glucose Monitoring Suppl (TRUE METRIX METER) w/Device KIT [id number] Yes USE AS DIRECTED [name], MD Taking Active Self  buPROPion (WELLBUTRIN XL) 300 MG 24 hr tablet [id number] Yes Take 1 tablet (300 mg total) by mouth daily. [name], MD Taking Active Self  Cholecalciferol 125 MCG (5000 UT) TABS [id number] Yes Take 5,000 Units by mouth daily. [provider] Taking Active Self  docusate sodium (COLACE) 100 MG capsule [id number] Yes Take 2 capsules (200 mg total) by mouth 2 (two) times daily.  Patient taking differently: Take 200 mg by mouth daily.   Candiss Norse,  Margaree Mackintosh, MD Taking Active Self           Med Note Wilmon Pali, MELISSA R   Wed Jun 22, 2021 12:38 PM)    empagliflozin (JARDIANCE) 10 MG TABS tablet [id number] No Take 1 tablet (10 mg total) by mouth daily before breakfast.  Patient not taking: Reported on 07/06/2021   Erma Heritage, PA-C Not Taking Active Self           Med Note Currie Paris May 31, 2021  8:52 AM) On hold per cardiology  FEROSUL 325 (65 Fe) MG tablet [id number] Yes TAKE 1 TABLET EVERY DAY [name], MD Taking Active Self  fexofenadine (ALLEGRA) 180 MG tablet [id number] Yes Take 180 mg by mouth daily. [provider] Taking Active Self  gabapentin (NEURONTIN) 100 MG capsule [id number] Yes TAKE 1 CAPSULE IN THE MORNING AND TAKE 2 CAPSULES IN THE EVENING [name], MD Taking Active Self  glipiZIDE (GLUCOTROL XL) 5 MG 24 hr tablet [id number] Yes Take 1 tablet by mouth once daily with  breakfast [name], MD Taking Active   hydrOXYzine (ATARAX/VISTARIL) 25 MG tablet [id number] Yes Take 1 tablet (25 mg total) by mouth every 8 (eight) hours as needed. [name], MD Taking Active Self  Krill Oil 350 MG CAPS [id number] Yes Take 350 mg by mouth daily. [provider] Taking Active Self           Med Note Lia Hopping, MINDY L   Fri Jan 21, 2021  8:16 AM)    lisinopril (ZESTRIL) 20 MG tablet [id number] Yes Take 1 tablet (20 mg total) by mouth daily. [name], MD Taking Active Self  metFORMIN (GLUCOPHAGE) 850 MG tablet [id number] Yes TAKE 1 TABLET TWICE DAILY WITH MEALS [name], MD Taking Active Self  metolazone (ZAROXOLYN) 5 MG tablet [id number] Yes TAKE 1 TABLET (5 MG TOTAL) BY MOUTH THREE TIMES A WEEK.  Patient taking differently: Take 5 mg by mouth See admin instructions. TAKE 1 TABLET (5 MG TOTAL) BY MOUTH THREE TIMES A WEEK AS NEEDED   Thompson Grayer, MD Taking Active Self           Med Note Currie Paris Apr 19, 2021  2:43 PM) Using as needed of cardiology   metoprolol tartrate (LOPRESSOR) 100 MG tablet [id number] Yes TAKE 1 TABLET TWICE DAILY Josue Hector, MD Taking Active Self  pantoprazole (PROTONIX) 40 MG tablet [id number] Yes TAKE 1 TABLET EVERY DAY [name], MD Taking Active Self  potassium chloride SA (KLOR-CON) 20 MEQ tablet [id number] Yes TAKE 3 TABS DAILY AND TAKE AN ADDITIONAL 1 TAB ON THE DAY YOU TAKE YOUR WEEKLY METOLAZONE DOSE Ahmed Prima, Fransisco Hertz, PA-C Taking Active Self  Semaglutide (RYBELSUS) 14 MG TABS [id number] Yes Take 14 mg by mouth in the morning. [name], MD Taking Active Self           Med Note Jim Like Mar 03, 2021  9:35 AM) Gets from patient assistance ([employer])  tamsulosin [facility]) 0.4 MG CAPS capsule [id number] Yes Take 1 capsule (0.4 mg total) by mouth daily. [place], Reece Leader., MD Taking Active Self  Tiotropium Bromide Monohydrate (SPIRIVA RESPIMAT) 2.5 MCG/ACT AERS  [id number] Yes Inhale 2 puffs into the lungs daily. [name], MD Taking Active Self           Med Note Jim Like Mar 03, 2021  9:34 AM) Gets from patient assistance ([employer])  torsemide (DEMADEX) 20 MG tablet [id number] Yes Take 2 tablets (40 mg total) by mouth 2 (two) times daily. Erma Heritage, [place] Taking Active Self  TRUE METRIX BLOOD GLUCOSE TEST test strip [id number] Yes TEST ONE TIME DAILY FOR DIABETES [name], MD Taking Active Self  TRUEplus Lancets 33G MISC [id number] Yes USE TO TEST ONE TIME DAILY FOR DIABETES [name], MD Taking Active Self  TURMERIC-GINGER PO [id number] Yes Take 2 each by mouth daily. [provider] Taking Active Self            Patient Active Problem List   Diagnosis Date Noted   S/P nasal septoplasty 07/01/2021   Melena 06/13/2021   Wound cellulitis 06/09/2021   SOB (shortness of breath) 06/09/2021   Pain due to onychomycosis of toenails of both feet 05/02/2021   Diabetic neuropathy ([place]) 05/02/2021   Callus of foot 05/02/2021   BPH with obstruction/lower urinary tract symptoms 04/05/2021   Acute on chronic diastolic CHF (congestive heart failure) ([place]) 03/25/2021   Morbid obesity with BMI of 60.0-69.9, adult ([place]) 03/25/2021   Mixed hyperlipidemia 03/24/2021   Hematuria 03/24/2021   Atrial fibrillation, chronic (HCC) 03/24/2021   Prolonged QT interval 03/24/2021   Iron deficiency anemia 02/21/2021   Constipation 02/21/2021   Rectal bleeding    Chronic blood loss anemia 11/12/2020   Gastrointestinal hemorrhage 10/08/2020   S/P placement of cardiac pacemaker 02/10/2020   Encounter for examination following treatment at hospital 02/10/2020   Anxiety 02/10/2020   DM2 (diabetes mellitus, type 2) ([place]) 02/10/2020   OSA on CPAP    History of pulmonary embolism 03/16/2019   Paroxysmal atrial fibrillation ([place]) 09/05/2018   COPD (chronic obstructive pulmonary disease) ([place]) 05/07/2018    Gastroesophageal reflux disease    Pulmonary embolism ([place]) 09/18/2017   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 08/23/2017   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   Pulmonary hypertension ([place])    Chronic periodontitis 07/18/2017   Morbid obesity ([place])    Essential hypertension    Tobacco abuse    Chronic heart failure with preserved ejection fraction (HFpEF) ([place])    Chronic venous insufficiency     Immunization History  Administered Date(s) Administered   Influenza Inj Mdck Quad Pf 02/27/2019   Influenza,inj,Quad PF,6+ Mos 02/10/2020, 03/23/2021   Janssen (J&J) SARS-COV-2 Vaccination 09/16/2019   Moderna SARS-COV2 Booster Vaccination 03/23/2021   PFIZER(Purple Top)SARS-COV-2 Vaccination 03/15/2020   Pneumococcal Polysaccharide-23 03/17/2019   Tdap 03/23/2021    Conditions to be addressed/monitored: Atrial Fibrillation, CHF, HTN, COPD, DMII, Anxiety, and Depression  Care Plan : Medication Management  Updates made by Beryle Lathe, [place] since 07/06/2021 12:00 AM     Problem: COPD, HFpEF, HTN, T2DM, Afib, anxiety/depression   Priority: High  Onset Date: 12/28/2020     Long-Range Goal: Disease Progression Prevention   Start Date: 12/28/2020  Expected End Date: 03/28/2021  Recent Progress: On track  Priority: High  Note:   Current Barriers:  Unable to independently monitor therapeutic efficacy  Pharmacist Clinical Goal(s):  Through collaboration with PharmD and provider, patient will achieve adherence to monitoring guidelines and medication adherence to achieve therapeutic efficacy   Interventions: 1:1 collaboration with [name], MD regarding development and update of comprehensive plan of care as evidenced by provider attestation and co-signature Inter-disciplinary care team collaboration (see longitudinal plan of care) Comprehensive medication review performed; medication list updated in  electronic medical record  Type 2 Diabetes - Goal Met.: Controlled; Most recent A1c was 5.7% which is at goal of <7% per ADA guidelines Current medications: metformin 850 mg by mouth twice daily, glipizide XL 5 mg by mouth with breakfast, and semaglutide (Rybelsus)  14 mg by mouth daily   Jardiance remains on hold per cardiology due to urinary tract infection  Patient is approved for Rybelsus through [employer] patient assistance program through 04/20/22 and Jardiance through [place] patient assistance program through 05/21/22 Intolerances: none Taking medications as directed: yes Side effects thought to be attributed to current medication regimen: GI upset with metformin Patient denies recent hypoglycemia (lowest blood glucose 82) Current meal patterns: breakfast: eggs, oatmeal, chicken sandwhich, and baked beans ; lunch:  ribeye and [employer] Breast in air fryer ; dinner:  leftovers from lunch ; snacks: none; drinks: water, regular soda, milk, and sports drinks Has noticed decreased appetite since starting Rybelsus. No nausea or vomiting Current exercise: none Current glucose readings over last week: fasting blood glucose mostly 130-140s Instructed to monitor blood sugars once a day at the following times: fasting (at least 8 hours since last food consumption), bedtime, and whenever patient experiences symptoms of hypo/hyperglycemia Continue metformin 850 mg by mouth twice daily, glipizide XL 5 mg by mouth with breakfast, and semaglutide (Rybelsus) 14 mg by mouth at least 30 minutes before the first food, beverage, or other oral medications of the day with no more than 4 ounces of plain water only Remain off Jardiance per cardiology. May consider restarting at some point. Patient expressed interest in weight loss today. Discussed bariatric surgery vs. pharmacologic. Patient is already on Rybelsus; however, may benefit from switch to Ozempic. Patient would like to discuss with primary care  provider at next visit.   Hypertension - Goal Met.: Blood pressure under good control. Factors affecting control of BP include high salt intake, elevated BMI, lack of exercise, sleep apnea, and volume overload. Blood pressure is at goal of <130/80 mmHg per 2017 AHA/ACC guidelines. Current medications: lisinopril 20 mg by mouth once daily, metoprolol tartrate 100 mg by mouth twice daily, torsemide 40 mg by mouth twice daily + potassium repletion 60 mEq once daily , and metolazone 5 mg by mouth as needed + additional potassium repletion 20 mEq daily  Other important details: serum creatinine improved; has obstructive sleep apnea on CPAP Intolerances: none Taking medications as directed: yes Side effects thought to be attributed to current medication regimen: no Recent home blood pressure readings: not discussed today Encourage dietary sodium restriction/DASH diet. Recommend regular aerobic exercise Recommend home blood pressure monitoring, to bring results in next visit Discussed need for and importance of continued work on weight loss Continue current medications as above. Continue diuretics per cardiology.   Heart failure with preserved ejection fraction (LVEF >50% with evidence of spontaneous or provokable increased LV filling pressures) - Condition stable. Not addressed this visit.: Appropriately managed; Has pacemaker Current treatment: lisinopril 20 mg by mouth once daily, metoprolol tartrate 100 mg by mouth twice daily, torsemide 40 mg by mouth twice daily + potassium repletion 60 mEq once daily , and metolazone 5 mg by mouth as needed + additional repletion 20 mEq daily Other important details: serum creatinine improved, Jardiance currently on hold per cardiology due to urinary tract infection.  Stage C (Symptomatic heart failure)/NYHA Class III (Marked limitation of physical activity. Comfortable at rest. Less than ordinary activity causes fatigue, palpitation, or dyspnea) Most recent  echocardiogram was in March  2022. LVEF was stable at 60-65%. BNP: 67 (01/30/21) Current home weights: mostly stable 470-480s Reports shortness of breath with activity or when lying down, fatigue and weakness, swelling in the legs, ankles and feet, and reduced ability to exercise. Denies persistent cough or wheezing, nausea and lack of appetite, and chest pain Encourage dietary sodium restriction (<3 g/day) Educated on the importance of weighing daily. Patient aware to contact cardiology/primary care team if weight gain >3 lbs in 1 day or >5 lbs in 1 week Recommend home blood pressure monitoring, to bring results in next visit Discussed need for and importance of continued work on weight loss Recommend restricted fluid intake (eg, 2 L/day) Remain off Jardiance per cardiology Continue beta blocker, ACE inhibitor, and diuretics + potassium repletion  Chronic Obstructive Pulmonary Disease - Goal on Track (progressing): YES.: Stable now status post nose surgery; Patient follows with Dr. Elsworth Soho for CPAP  Stopped smoking 08/23/2017; smoked for 30 years (1 PPD) Current treatment: albuterol metered dose (ProAir, Ventolin, Proventil) 1 puff by mouth as needed for shortness of breath and tiotropium (Spiriva Respimat) 2 inhalations once daily  Patient is approved for Spiriva through [place] patient assistance program through 05/21/22 Patient reports only using albuterol a couple times per week but often sounds out of breath which is likely complicated by his heart failure GOLD Classification: unknown CAT score (12/28/20): 23 Most recent Pulmonary Function Testing: N/A 0 exacerbations requiring treatment in the last 6 months  Current oxygen requirements: none Continue Spiriva 2 puffs once daily and albuterol as needed for shortness of breath   Patient was previously instructed on the appropriate inhaler technique for Spiriva Respimat  Atrial Fibrillation - Condition stable. Not addressed this  visit.: Controlled Current rate control: metoprolol tartrate 100 mg by mouth twice daily Most recent ECG: normal sinus Anticoagulation: rivaroxaban (Xarelto) 20 mg by mouth once daily Denies signs and symptoms of bleeding CHADS2VASc score: 3 (CHF, hypertension, and diabetes) Prior ablation: '[]'  Yes  '[x]'  No Home blood pressure: 130s/80s Home heart rate: unknown Encouraged regular physical activity for general health benefits Recommend home blood pressure and heart rate monitoring, to bring results in next visit Patient may qualify for patient assistance for Xarelto after he has spent at least 4% of his gross annual income at the pharmacy. Continue metoprolol tartrate 100 mg by mouth twice daily and rivaroxaban (Xarelto) 20 mg by mouth once daily  Depression and Anxiety - Condition stable. Not addressed this visit.: Controlled per patient Current medications: bupropion (Wellbutrin) 300 mg by mouth once daily and hydroxyzine 25 mg by mouth every 8 hours Current non-pharmacologic treatment: None, patient declines talk therapy at this time despite encouragement to consider Current symptoms include: anhedonia, depressed mood, and fatigue Continue current medications as above  Patient Goals/Self-Care Activities Patient will:  Take medications as prescribed Check glucose twice daily (fasting and bedtime), document, and provide at future appointments Check blood pressure at least once daily, document, and provide at future appointments Weigh daily, and contact provider if weight gain of 3 lbs in a day or more than 5 lbs in a week  Follow Up Plan: Telephone follow up appointment with care management team member scheduled for: 09/06/21      Medication Assistance:  see care plan  Patient's preferred pharmacy is:  [facility] [address] [place] [address] Phone: [phone] Fax: [phone]  [facility] [place], [place]  [address] [place] [address] Phone: [phone] Fax: [phone]  [place], [place] [address] [place] [address] Phone: [phone] Fax: [phone] [facility], [place] [address] [place] [address] Phone: [phone] Fax: [phone]  Follow Up:  Patient agrees to Care Plan and Follow-up.  Plan: Telephone follow up appointment with care management team member scheduled for:  09/06/21  Kennon Holter, PharmD, [place], [place] Clinical Pharmacist Practitioner [facility] [phone]

## 2021-07-06 NOTE — Telephone Encounter (Signed)
Called and spoke to patient regarding order for cpap. He states he placed order last night and it is pending so he will call today and let us know if he needs the order still.

## 2021-07-06 NOTE — Patient Instructions (Addendum)
[name],  It was great to talk to you today!  Please call me with any questions or concerns.  Visit Information  Following are the goals we discussed today:   Goals Addressed             This Visit's Progress    Medication Management       Patient Goals/Self-Care Activities Patient will:  Take medications as prescribed Check glucose twice daily (fasting and bedtime), document, and provide at future appointments Check blood pressure at least once daily, document, and provide at future appointments Weigh daily, and contact provider if weight gain of 3 lbs in a day or more than 5 lbs in a week              Follow-up plan: Telephone follow up appointment with care management team member scheduled for:  09/06/21  Patient verbalizes understanding of instructions and care plan provided today and agrees to view in [place]. Active MyChart status confirmed with patient.    Please call the care guide team at [phone] if you need to cancel or reschedule your appointment.   Kennon Holter, PharmD, [place], CPP Clinical Pharmacist Practitioner [facility] [phone]

## 2021-07-07 ENCOUNTER — Other Ambulatory Visit: Payer: Self-pay

## 2021-07-07 ENCOUNTER — Ambulatory Visit (HOSPITAL_COMMUNITY): Payer: Medicare HMO | Admitting: Physical Therapy

## 2021-07-07 DIAGNOSIS — I878 Other specified disorders of veins: Secondary | ICD-10-CM | POA: Diagnosis not present

## 2021-07-07 DIAGNOSIS — R600 Localized edema: Secondary | ICD-10-CM | POA: Diagnosis not present

## 2021-07-07 DIAGNOSIS — S81802S Unspecified open wound, left lower leg, sequela: Secondary | ICD-10-CM

## 2021-07-07 NOTE — Therapy (Signed)
[place], [place], [address] Phone: [phone]   Fax:  [phone]  Wound Care Therapy  Patient Details  Name: Reginald [name] MRN: [id number] Date of Birth: 11-Aug-1970 Referring Provider (PT): Tula Nakayama  PHYSICAL THERAPY DISCHARGE SUMMARY  Visits from Start of Care: 7  Current functional level related to goals / functional outcomes: See below   Remaining deficits: edema   Education / Equipment: Given    Patient agrees to discharge. Patient goals were partially met, met for referral of wound care.  Pt still has increasing edema.   Patient is being discharged due to  wound is healed. .  Encounter Date: 07/07/2021   PT End of Session - 07/07/21 1456     Visit Number 7    Number of Visits 7    Authorization Type [employer]    PT Start Time 3094    PT Stop Time 0768    PT Time Calculation (min) 38 min    Activity Tolerance Patient tolerated treatment well    Behavior During Therapy WFL for tasks assessed/performed             Past Medical History:  Diagnosis Date   Anemia    Anxiety    Aortic stenosis    Arthritis    Asthma    Back pain    Cellulitis of skin with lymphangitis    CHF (congestive heart failure) (HCC)    Chronic diastolic congestive heart failure (HCC)    Chronic venous insufficiency    Constipation    COPD (chronic obstructive pulmonary disease) (HCC)    Depression    Diabetes ([place])    Dyspnea    Essential hypertension    Gout    Heart murmur    Hypertension    Morbid obesity ([place])    Obesity    Pneumonia    walking pneumonia   Prostatitis    Pulmonary embolism ([place])    Pulmonary hypertension (HCC)    Pyelonephritis    S/P aortic valve replacement with bioprosthetic valve 08/23/2017   25 mm Edwards Inspiris Resilia stented bovine pericardial tissue valve   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   24 mm Hemashield supracoronary straight graft    Sleep apnea     cpap    Thoracic ascending aortic aneurysm    Thoracic ascending aortic aneurysm    Tobacco abuse     Past Surgical History:  Procedure Laterality Date   AORTIC VALVE REPLACEMENT N/A 08/23/2017   Procedure: AORTIC VALVE REPLACEMENT (AVR) USING INSPIRIS RESILIA AORTIC VALVE SIZE 25 MM;  Surgeon: Rexene Alberts, MD;  Location: [facility];  Service: Open Heart Surgery;  Laterality: N/A;   CARDIAC VALVE REPLACEMENT N/A    [employer] 02/07/2020   COLONOSCOPY WITH PROPOFOL N/A 11/14/2020   Procedure: COLONOSCOPY WITH PROPOFOL;  Surgeon: Gatha Mayer, MD;  Location: [facility] ENDOSCOPY;  Service: Endoscopy;  Laterality: N/A;   ESOPHAGOGASTRODUODENOSCOPY (EGD) WITH PROPOFOL N/A 11/14/2020   Procedure: ESOPHAGOGASTRODUODENOSCOPY (EGD) WITH PROPOFOL;  Surgeon: Gatha Mayer, MD;  Location: [facility];  Service: Endoscopy;  Laterality: N/A;   GIVENS CAPSULE STUDY N/A 06/30/2021   Procedure: GIVENS CAPSULE STUDY;  Surgeon: Harvel Quale, MD;  [facility];  Service: Gastroenterology;  Laterality: N/A;  7:30   MULTIPLE EXTRACTIONS WITH ALVEOLOPLASTY N/A 07/23/2017   Procedure: Extraction of tooth #10 with alveoloplasty and gross debridement of remaining teeth;  Surgeon: Lenn Cal, DDS;  Location: [facility]  OR;  Service: Oral Surgery;  Laterality: N/A;   NASAL SEPTOPLASTY W/ TURBINOPLASTY Bilateral 07/01/2021   Procedure: NASAL SEPTOPLASTY WITH TURBINATE REDUCTION;  Surgeon: Leta Baptist, MD;  Location: [facility];  Service: ENT;  Laterality: Bilateral;   PACEMAKER IMPLANT N/A 08/28/2017    St Jude Medical Assurity MRI conditional  dual-chamber pacemaker for symptomatic complete heart blockby Dr Rayann Heman   TEE WITHOUT CARDIOVERSION N/A 07/16/2017   Procedure: TRANSESOPHAGEAL ECHOCARDIOGRAM (TEE);  Surgeon: Lelon Perla, MD;  Location: [facility] ENDOSCOPY;  Service: Cardiovascular;  Laterality: N/A;   TEE WITHOUT CARDIOVERSION N/A 08/23/2017   Procedure: TRANSESOPHAGEAL ECHOCARDIOGRAM (TEE);   Surgeon: Rexene Alberts, MD;  Location: [facility];  Service: Open Heart Surgery;  Laterality: N/A;   THORACIC AORTIC ANEURYSM REPAIR N/A 08/23/2017   Procedure: THORACIC ASCENDING ANEURYSM REPAIR (AAA) USING HEMASHIELD GOLD KNITTED MICROVEL DOUBLE VELOUR VASCULAR GRAFT D: 24 MM  L: 30 CM;  Surgeon: Rexene Alberts, MD;  Location: [facility];  Service: Open Heart Surgery;  Laterality: N/A;    There were no vitals filed for this visit.         LYMPHEDEMA/ONCOLOGY QUESTIONNAIRE - 07/07/21 0001       Right Lower Extremity Lymphedema   30 cm Proximal to Floor at Lateral Plantar Foot 61 cm    20 cm Proximal to Floor at Lateral Plantar Foot 47 1    10 cm Proximal to Floor at Lateral Malleoli 33 cm                  Wound Therapy - 07/07/21 0001     Wound Properties Date First Assessed: 06/17/21 Time First Assessed: 1410 Location: Leg Location Orientation: Left Present on Admission: Yes Final Assessment Date: 07/07/21 Final Assessment Time: 1408   % Wound base Red or Granulating 100%    Wound Depth (cm) 0 cm    Treatment --   self care see education section                    PT Education - 07/07/21 1450     Education Details Wound is healed but newly healed therapist urged pt to use a bandaid on this area for a week. Therapist showed pt a juxtafit garment as pt was having extreme difficulty donning the compression garment even with a butler.  Therapist explained to pt that he would benefit from lymphedema services if he would adhere to the program which includes exercising but pt stated that he could not commit to this at this time.  Pt will be discharged from skilled therapy.  Therapist measured pt for compression garment on both sides to ensure that pt ordered the right size and gave pt the measurements.    Person(s) Educated Patient    Methods Explanation;Handout    Comprehension Verbalized understanding              PT Short Term Goals - 07/07/21 1459       PT  SHORT TERM GOAL #1   Title Pt wound to be 100% granulated and no larger than .7x .7    Time 2    Period Weeks    Status Achieved    Target Date 07/01/21      PT SHORT TERM GOAL #2   Title PT to have lost 2 cm from 30and 20 cm measurements to create a wound healing environment.    Time 2    Period Weeks    Status Not met  PT Long Term Goals - 07/07/21 1459       PT LONG TERM GOAL #1   Title PT wound on his LT LE to be healed    Period Weeks    Status Achieved      PT LONG TERM GOAL #2   Title PT to have and be using a compression pump    Status On-going will be delivered  next week                   Plan - 07/07/21 1456     Clinical Impression Statement PT wound is now healed.  Treatment consisted of self care for donning compression garment which pt was having a difficult time.  Therapist demonstrated a juxtafit garment which pt feels he will be able to don easier.  Pt remeasured for proper fitting of compression.  Discussed lymphedema treatments but pt does not feel that he could be compliant at this time.    Personal Factors and Comorbidities Comorbidity 3+;Finances;Fitness;Time since onset of injury/illness/exacerbation    Comorbidities CHF, CVI, COPD, DM, morbid obesity, aortic valve replacement with pacemaker    Examination-Activity Limitations Bathing;Dressing;Hygiene/Grooming;Locomotion Level;Stand    Stability/Clinical Decision Making Stable/Uncomplicated    Clinical Decision Making Moderate    Rehab Potential Good    PT Frequency 2x / week    PT Duration 4 weeks    PT Treatment/Interventions Patient/family education;Other (comment);Manual techniques    PT Next Visit Plan Discharge to home program as wound is healed.    Consulted and Agree with Plan of Care Patient             Patient will benefit from skilled therapeutic intervention in order to improve the following deficits and impairments:  Obesity, Pain, Decreased skin  integrity, Increased edema  Visit Diagnosis: Localized edema  Leg wound, left, sequela     Problem List Patient Active Problem List   Diagnosis Date Noted   S/P nasal septoplasty 07/01/2021   Melena 06/13/2021   Wound cellulitis 06/09/2021   SOB (shortness of breath) 06/09/2021   Pain due to onychomycosis of toenails of both feet 05/02/2021   Diabetic neuropathy ([place]) 05/02/2021   Callus of foot 05/02/2021   BPH with obstruction/lower urinary tract symptoms 04/05/2021   Acute on chronic diastolic CHF (congestive heart failure) ([place]) 03/25/2021   Morbid obesity with BMI of 60.0-69.9, adult ([place]) 03/25/2021   Mixed hyperlipidemia 03/24/2021   Hematuria 03/24/2021   Atrial fibrillation, chronic (HCC) 03/24/2021   Prolonged QT interval 03/24/2021   Iron deficiency anemia 02/21/2021   Constipation 02/21/2021   Rectal bleeding    Chronic blood loss anemia 11/12/2020   Gastrointestinal hemorrhage 10/08/2020   S/P placement of cardiac pacemaker 02/10/2020   Encounter for examination following treatment at hospital 02/10/2020   Anxiety 02/10/2020   DM2 (diabetes mellitus, type 2) ([place]) 02/10/2020   OSA on CPAP    History of pulmonary embolism 03/16/2019   Paroxysmal atrial fibrillation ([place]) 09/05/2018   COPD (chronic obstructive pulmonary disease) ([place]) 05/07/2018   Gastroesophageal reflux disease    Pulmonary embolism ([place]) 09/18/2017   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 08/23/2017   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   Pulmonary hypertension ([place])    Chronic periodontitis 07/18/2017   Morbid obesity ([place])    Essential hypertension    Tobacco abuse    Chronic heart failure with preserved ejection fraction (HFpEF) (HCC)    Chronic venous insufficiency  Rayetta Humphrey, PT CLT [phone]  07/07/2021, 3:03 PM  [facility] [address] [place],  [place], [address] Phone: [phone]   Fax:  [phone]  Name: Reginald [name] MRN: [id number] Date of Birth: April 14, 1971

## 2021-07-08 ENCOUNTER — Encounter: Payer: Self-pay | Admitting: Pharmacist

## 2021-07-08 ENCOUNTER — Ambulatory Visit: Payer: Medicare HMO | Admitting: Pharmacist

## 2021-07-08 DIAGNOSIS — I10 Essential (primary) hypertension: Secondary | ICD-10-CM

## 2021-07-08 DIAGNOSIS — I480 Paroxysmal atrial fibrillation: Secondary | ICD-10-CM

## 2021-07-08 DIAGNOSIS — F339 Major depressive disorder, recurrent, unspecified: Secondary | ICD-10-CM

## 2021-07-08 DIAGNOSIS — I5032 Chronic diastolic (congestive) heart failure: Secondary | ICD-10-CM

## 2021-07-08 DIAGNOSIS — E1169 Type 2 diabetes mellitus with other specified complication: Secondary | ICD-10-CM

## 2021-07-08 DIAGNOSIS — F419 Anxiety disorder, unspecified: Secondary | ICD-10-CM

## 2021-07-08 DIAGNOSIS — J449 Chronic obstructive pulmonary disease, unspecified: Secondary | ICD-10-CM

## 2021-07-08 NOTE — Patient Instructions (Addendum)
Reginald [name],  It was great to talk to you today!  As we discussed, Dr. Posey Pronto is agreeable to switching you from Rybelsus to Ozempic to see if that will help you lose some more weight. We also recommend trying to improve your diet as well as exercise in addition to the medication change. I sent in the prescriptions to [facility] patient assistance program today and hopefully our office will receive your Ozempic in the next few weeks. Please continue taking Rybelsus as instructed for now. Once your Ozempic arrives at our office, we will have you discontinue Rybelsus and start taking Ozempic 1 mg subcutaneously once weekly for 4 weeks then we will have you increase to 2 mg subcutaneously once weekly as long as you are tolerating the medication well. See below administration instructions for Ozempic.   Please call me with any questions or concerns.  Visit Information  Following are the goals we discussed today:   Goals Addressed             This Visit's Progress    Medication Management       Patient Goals/Self-Care Activities Patient will:  Take medications as prescribed Check glucose twice daily (fasting and bedtime), document, and provide at future appointments Check blood pressure at least once daily, document, and provide at future appointments Weigh daily, and contact provider if weight gain of 3 lbs in a day or more than 5 lbs in a week               Follow-up plan: Telephone follow up appointment with care management team member scheduled for:  09/06/21  Patient verbalizes understanding of instructions and care plan provided today and agrees to view in [facility]. Active MyChart status confirmed with patient.    Please call the care guide team at [phone] if you need to cancel or reschedule your appointment.   Kennon Holter, PharmD, BCACP, CPP Clinical Pharmacist Practitioner [facility] [phone]   Ozempic (semaglutide)   What is this  medicine used for: Used to lower your blood sugar and treat your diabetes.  How to take the medicine:  With each use of the pen: Take your pen out of the refrigerator and let it come to room temperature (about 15 minutes). Check the pen window to make sure that Ozempic in your pen is clear and colorless (if the medication looks cloudy, do not use the pen) Pull off pen cap and wipe rubber stopper tip with alcohol swab Twist on a new needle and pull off outer cap, do not throw this away Pull off inner needle cap and throw it away Before the first time you use a new pen (priming the pen): Turn the dose selector until the dose counter shows the "flow check symbol" Hold pen upright and press the dose button until the dose counter shows 0 Repeat until a drop of the medicine appears at the tip of the needle Using the pen and injecting your dose: Dial your dose using the dose selector Choose your injection site (stomach, thigh, or upper arm) and wipe the skin with an alcohol swab. Let dry before injecting your dose Insert the needle into the skin at your injection site. Make sure you can see the dose counter, do not cover with your hand Press down on the dose button to inject until the dose counter shows 0 and lines up with the dose pointer. You may hear or feel a click Keep the needle in your skin and count  slowly for 6 seconds Withdraw the needle from your skin and carefully cap the needle using the outer needle cap. Unscrew the needle from the pen and place the needle in a puncture-resistant container Rotate injection sites weekly  Most common side effects: Nausea/Vomiting Diarrhea Abdominal pain  Storage: How Supplied: Each carton contains 1-2 pens  Each pen contains 2-4 doses Store new, unopened pens in the refrigerator. Do not freeze. Store your used pen at room temperature or in the refrigerator for 56 days. Pens should be thrown away after 56 days, even if they still have medication  remaining.

## 2021-07-08 NOTE — Chronic Care Management (AMB) (Signed)
Chronic Care Management Pharmacy Note  07/08/2021 Name:  Reginald [name] MRN:  [id number] DOB:  01-17-1971  Summary: Type 2 Diabetes/Obesity Patient expressed further interest in weight loss today. Previously discussed bariatric surgery vs. pharmacologic. Patient is already on Rybelsus; however, may benefit from switch to Ozempic. Per patient request, discussed this with primary care provider and agreeable to therapy change.  Per discussion with primary care provider, will begin transition from Rybelsus to [place]. Since patient is taking Rybelsus 14 mg daily, will transition patient to Ozempic 1 mg subcutaneously once weekly x4 weeks then increase to 57m subcutaneously once weekly thereafter if patient able to tolerate. [facility]. Anticipate therapy change in ~4 weeks.   Subjective: Reginald CHAUVINis an 51y.o. year old male who is a primary patient of PLindell Spar MD.  The CCM team was consulted for assistance with disease management and care coordination needs.    Engaged with patient by telephone for follow up visit in response to provider referral for pharmacy case management and/or care coordination services.   Consent to Services:  The patient was given information about Chronic Care Management services, agreed to services, and gave verbal consent prior to initiation of services.  Please see initial visit note for detailed documentation.   Patient Care Team: PLindell Spar MD as PCP - General (Internal Medicine) AThompson Grayer MD as PCP - Electrophysiology (Cardiology) NJosue Hector MD as PCP - Cardiology (Cardiology) FKassie Mends RN as TPleasant GroveManagement WBeryle Lathe [facility](Pharmacist)  Objective:  Lab Results  Component Value Date   CREATININE 1.20 07/05/2021   CREATININE 1.88 (H) 06/28/2021   CREATININE 1.43 (H) 05/13/2021    Lab Results  Component  Value Date   HGBA1C 5.7 (H) 06/28/2021   Last diabetic Eye exam: No results found for: HMDIABEYEEXA  Last diabetic Foot exam: No results found for: HMDIABFOOTEX      Component Value Date/Time   CHOL 97 03/25/2021 0539   CHOL 123 06/10/2020 0837   TRIG 138 03/25/2021 0539   HDL 27 (L) 03/25/2021 0539   HDL 25 (L) 06/10/2020 0837   CHOLHDL 3.6 03/25/2021 0539   VLDL 28 03/25/2021 0539   LDLCALC 42 03/25/2021 0539   LDLCALC 54 06/10/2020 0837    Hepatic Function Latest Ref Rng & Units 07/05/2021 05/13/2021 03/25/2021  Total Protein 6.0 - 8.5 g/dL 7.2 8.2 7.5  Albumin 4.0 - 5.0 g/dL 4.0 4.0 3.2(L)  AST 0 - 40 IU/L 43(H) 51(H) 32  ALT 0 - 44 IU/L '20 20 20  ' Alk Phosphatase 44 - 121 IU/L 90 103 75  Total Bilirubin 0.0 - 1.2 mg/dL 1.0 1.3(H) 1.9(H)  Bilirubin, Direct 0.0 - 0.2 mg/dL - - -    Lab Results  Component Value Date/Time   TSH 3.920 05/13/2021 10:01 AM   TSH 4.060 10/04/2020 09:43 AM   FREET4 1.30 10/04/2020 09:43 AM   FREET4 0.79 (L) 05/03/2018 02:04 PM    CBC Latest Ref Rng & Units 07/05/2021 06/28/2021 06/13/2021  WBC 3.4 - 10.8 x10E3/uL 10.0 10.8(H) 9.4  Hemoglobin 13.0 - 17.7 g/dL 10.2(L) 10.8(L) 11.9(L)  Hematocrit 37.5 - 51.0 % 32.2(L) 36.0(L) 37.3(L)  Platelets 150 - 450 x10E3/uL 262 351 254    Lab Results  Component Value Date/Time   VD25OH 24.8 (L) 05/13/2021 10:01 AM    Clinical ASCVD: No  The ASCVD Risk score (Arnett  DK, et al., 2019) failed to calculate for the following reasons:   The valid total cholesterol range is 130 to 320 mg/dL    Social History   Tobacco Use  Smoking Status Former   Packs/day: 2.00   Years: 16.00   Pack years: 32.00   Types: Cigarettes   Quit date: 08/23/2017   Years since quitting: 3.8  Smokeless Tobacco Never   BP Readings from Last 3 Encounters:  07/02/21 133/86  06/28/21 110/60  06/17/21 110/78   Pulse Readings from Last 3 Encounters:  07/02/21 74  06/28/21 72  06/17/21 92   Wt Readings from Last 3  Encounters:  07/01/21 (!) 484 lb (219.5 kg)  06/30/21 (!) 485 lb (220 kg)  06/28/21 (!) 494 lb 1.6 oz (224.1 kg)    Assessment: Review of patient past medical history, allergies, medications, health status, including review of consultants reports, laboratory and other test data, was performed as part of comprehensive evaluation and provision of chronic care management services.   SDOH:  (Social Determinants of Health) assessments and interventions performed:    CCM Care Plan  No Known Allergies  Medications Reviewed Today     Reviewed by Beryle Lathe, [facility] (Pharmacist) on 07/08/21 at 0944  Med List Status: <None>   Medication Order Taking? Sig Documenting Provider Last Dose Status Informant  acetaminophen (TYLENOL) 325 MG tablet [id number] Yes Take 650 mg by mouth every 6 (six) hours as needed for mild pain. [provider] Taking Active Self  albuterol (PROVENTIL) (2.5 MG/3ML) 0.083% nebulizer solution [id number] Yes INHALE 1 VIAL VIA NEBULIZER EVERY 6 HOURS AS NEEDED FOR WHEEZING OR SHORTNESS OF BREATH [name], MD Taking Active Self  albuterol (VENTOLIN HFA) 108 (90 Base) MCG/ACT inhaler [id number] Yes INHALE 2 PUFFS INTO THE LUNGS EVERY 6 (SIX) HOURS AS NEEDED FOR WHEEZING OR SHORTNESS OF BREATH. [name], MD Taking Active Self  allopurinol (ZYLOPRIM) 300 MG tablet [id number] Yes TAKE 1 TABLET EVERY DAY [name], MD Taking Active Self  atorvastatin (LIPITOR) 40 MG tablet [id number] Yes TAKE 1 TABLET EVERY DAY [name], MD Taking Active Self  Blood Glucose Monitoring Suppl (TRUE METRIX METER) w/Device KIT [id number] Yes USE AS DIRECTED [name], MD Taking Active Self  buPROPion (WELLBUTRIN XL) 300 MG 24 hr tablet [id number] Yes Take 1 tablet (300 mg total) by mouth daily. [name], MD Taking Active Self  Cholecalciferol 125 MCG (5000 UT) TABS [id number] Yes Take 5,000 Units by mouth daily. [provider] Taking Active Self   docusate sodium (COLACE) 100 MG capsule [id number] Yes Take 2 capsules (200 mg total) by mouth 2 (two) times daily.  Patient taking differently: Take 200 mg by mouth daily.   Thurnell Lose, MD Taking Active Self           Med Note Wilmon Pali, MELISSA R   Wed Jun 22, 2021 12:38 PM)    empagliflozin (JARDIANCE) 10 MG TABS tablet [id number] No Take 1 tablet (10 mg total) by mouth daily before breakfast.  Patient not taking: Reported on 07/06/2021   Erma Heritage, PA-C Not Taking Active Self           Med Note Currie Paris May 31, 2021  8:52 AM) On hold per cardiology  FEROSUL 325 (65 Fe) MG tablet [id number] Yes TAKE 1 TABLET EVERY DAY [name], MD Taking Active Self  fexofenadine (ALLEGRA) 180 MG tablet [id number] Yes Take 180 mg  by mouth daily. [provider] Taking Active Self  gabapentin (NEURONTIN) 100 MG capsule [id number] Yes TAKE 1 CAPSULE IN THE MORNING AND TAKE 2 CAPSULES IN THE EVENING [name], MD Taking Active Self  glipiZIDE (GLUCOTROL XL) 5 MG 24 hr tablet [id number] Yes Take 1 tablet by mouth once daily with breakfast [name], MD Taking Active   hydrOXYzine (ATARAX/VISTARIL) 25 MG tablet [id number] Yes Take 1 tablet (25 mg total) by mouth every 8 (eight) hours as needed. [name], MD Taking Active Self  Krill Oil 350 MG CAPS [id number] Yes Take 350 mg by mouth daily. [provider] Taking Active Self           Med Note Lia Hopping, MINDY L   Fri Jan 21, 2021  8:16 AM)    lisinopril (ZESTRIL) 20 MG tablet [id number] Yes Take 1 tablet (20 mg total) by mouth daily. [name], MD Taking Active Self  metFORMIN (GLUCOPHAGE) 850 MG tablet [id number] Yes TAKE 1 TABLET TWICE DAILY WITH MEALS [name], MD Taking Active Self  metolazone (ZAROXOLYN) 5 MG tablet [id number] Yes TAKE 1 TABLET (5 MG TOTAL) BY MOUTH THREE TIMES A WEEK.  Patient taking differently: Take 5 mg by mouth See admin instructions. TAKE 1 TABLET (5 MG  TOTAL) BY MOUTH THREE TIMES A WEEK AS NEEDED   Thompson Grayer, MD Taking Active Self           Med Note Currie Paris Apr 19, 2021  2:43 PM) Using as needed of cardiology   metoprolol tartrate (LOPRESSOR) 100 MG tablet [id number] Yes TAKE 1 TABLET TWICE DAILY Josue Hector, MD Taking Active Self  pantoprazole (PROTONIX) 40 MG tablet [id number] Yes TAKE 1 TABLET EVERY DAY [name], MD Taking Active Self  potassium chloride SA (KLOR-CON) 20 MEQ tablet [id number] Yes TAKE 3 TABS DAILY AND TAKE AN ADDITIONAL 1 TAB ON THE DAY YOU TAKE YOUR WEEKLY METOLAZONE DOSE Ahmed Prima, Fransisco Hertz, PA-C Taking Active Self  Semaglutide (RYBELSUS) 14 MG TABS [id number] Yes Take 14 mg by mouth in the morning. [name], MD Taking Active Self           Med Note Jim Like Mar 03, 2021  9:35 AM) Gets from patient assistance ([employer])  tamsulosin [facility]) 0.4 MG CAPS capsule [id number] Yes Take 1 capsule (0.4 mg total) by mouth daily. [place], Reece Leader., MD Taking Active Self  Tiotropium Bromide Monohydrate (SPIRIVA RESPIMAT) 2.5 MCG/ACT AERS [id number] Yes Inhale 2 puffs into the lungs daily. [name], MD Taking Active Self           Med Note Jim Like Mar 03, 2021  9:34 AM) Gets from patient assistance [facility])  torsemide (DEMADEX) 20 MG tablet [id number] Yes Take 2 tablets (40 mg total) by mouth 2 (two) times daily. Erma Heritage, [place] Taking Active Self  TRUE METRIX BLOOD GLUCOSE TEST test strip [id number] Yes TEST ONE TIME DAILY FOR DIABETES [name], MD Taking Active Self  TRUEplus Lancets 33G MISC [id number] Yes USE TO TEST ONE TIME DAILY FOR DIABETES [name], MD Taking Active Self  TURMERIC-GINGER PO [id number] Yes Take 2 each by mouth daily. [provider] Taking Active Self            Patient Active Problem List   Diagnosis Date Noted   S/P nasal septoplasty 07/01/2021   Melena 06/13/2021  Wound  cellulitis 06/09/2021   SOB (shortness of breath) 06/09/2021   Pain due to onychomycosis of toenails of both feet 05/02/2021   Diabetic neuropathy ([place]) 05/02/2021   Callus of foot 05/02/2021   BPH with obstruction/lower urinary tract symptoms 04/05/2021   Acute on chronic diastolic CHF (congestive heart failure) ([place]) 03/25/2021   Morbid obesity with BMI of 60.0-69.9, adult ([place]) 03/25/2021   Mixed hyperlipidemia 03/24/2021   Hematuria 03/24/2021   Atrial fibrillation, chronic ([place]) 03/24/2021   Prolonged QT interval 03/24/2021   Iron deficiency anemia 02/21/2021   Constipation 02/21/2021   Rectal bleeding    Chronic blood loss anemia 11/12/2020   Gastrointestinal hemorrhage 10/08/2020   S/P placement of cardiac pacemaker 02/10/2020   Encounter for examination following treatment at hospital 02/10/2020   Anxiety 02/10/2020   DM2 (diabetes mellitus, type 2) ([employer]) 02/10/2020   OSA on CPAP    History of pulmonary embolism 03/16/2019   Paroxysmal atrial fibrillation ([place]) 09/05/2018   COPD (chronic obstructive pulmonary disease) ([place]) 05/07/2018   Gastroesophageal reflux disease    Pulmonary embolism ([place]) 09/18/2017   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 08/23/2017   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   Pulmonary hypertension ([place])    Chronic periodontitis 07/18/2017   Morbid obesity ([place])    Essential hypertension    Tobacco abuse    Chronic heart failure with preserved ejection fraction (HFpEF) ([place])    Chronic venous insufficiency     Immunization History  Administered Date(s) Administered   Influenza Inj Mdck Quad Pf 02/27/2019   Influenza,inj,Quad PF,6+ Mos 02/10/2020, 03/23/2021   Janssen (J&J) SARS-COV-2 Vaccination 09/16/2019   Moderna SARS-COV2 Booster Vaccination 03/23/2021   PFIZER(Purple Top)SARS-COV-2 Vaccination 03/15/2020   Pneumococcal Polysaccharide-23 03/17/2019   Tdap 03/23/2021     Conditions to be addressed/monitored: Atrial Fibrillation, CHF, HTN, COPD, DMII, Anxiety, Depression, and obesity  Care Plan : Medication Management  Updates made by Beryle Lathe, [place] since 07/08/2021 12:00 AM     Problem: COPD, HFpEF, HTN, T2DM, Afib, Obesity, Anxiety/Depression   Priority: High  Onset Date: 12/28/2020     Long-Range Goal: Disease Progression Prevention   Start Date: 12/28/2020  Expected End Date: 03/28/2021  Recent Progress: On track  Priority: High  Note:   Current Barriers:  Unable to independently monitor therapeutic efficacy  Pharmacist Clinical Goal(s):  Through collaboration with PharmD and provider, patient will achieve adherence to monitoring guidelines and medication adherence to achieve therapeutic efficacy   Interventions: 1:1 collaboration with [name], MD regarding development and update of comprehensive plan of care as evidenced by provider attestation and co-signature Inter-disciplinary care team collaboration (see longitudinal plan of care) Comprehensive medication review performed; medication list updated in electronic medical record  Type 2 Diabetes - Goal Met.: Controlled; Most recent A1c was 5.7% which is at goal of <7% per ADA guidelines Current medications: metformin 850 mg by mouth twice daily, glipizide XL 5 mg by mouth with breakfast, and semaglutide (Rybelsus)  14 mg by mouth daily   Jardiance remains on hold per cardiology due to urinary tract infection  Patient is approved for Rybelsus through [employer] patient assistance program through 04/20/22 and Jardiance through [place] patient assistance program through 05/21/22 Patient expressed further interest in weight loss today. Previously discussed bariatric surgery vs. pharmacologic. Patient is already on Rybelsus; however, may benefit from switch to Ozempic. Per patient request, discussed this with primary care provider and agreeable to therapy change.  Intolerances:  none Taking medications as directed: yes Side effects thought to be attributed to current medication regimen: mild GI upset with metformin Patient denies recent hypoglycemia (lowest blood glucose 82) Current meal patterns: breakfast: eggs, oatmeal, chicken sandwhich, and baked beans ; lunch:  ribeye and [employer] Breast in air fryer ; dinner:  leftovers from lunch ; snacks: none; drinks: water, regular soda, milk, and sports drinks Has noticed decreased appetite since starting Rybelsus. No nausea or vomiting Current exercise: none Current glucose readings over last week: fasting blood glucose mostly 130-140s Instructed to monitor blood sugars once a day at the following times: fasting (at least 8 hours since last food consumption), bedtime, and whenever patient experiences symptoms of hypo/hyperglycemia Continue metformin 850 mg by mouth twice daily, glipizide XL 5 mg by mouth with breakfast, and semaglutide (Rybelsus) 14 mg by mouth daily Remain off Jardiance per cardiology. May consider restarting at some point. Per discussion with primary care provider, will begin transition from Rybelsus to [place]. Since patient is taking Rybelsus 14 mg daily, will transition patient to Ozempic 1 mg subcutaneously once weekly x4 weeks then increase to 42m subcutaneously once weekly thereafter if patient able to tolerate. [facility] for both doses [facility]. Anticipate therapy change in ~4 weeks.   Hypertension - Goal Met.: Blood pressure under good control. Factors affecting control of BP include high salt intake, elevated BMI, lack of exercise, sleep apnea, and volume overload. Blood pressure is at goal of <130/80 mmHg per 2017 AHA/ACC guidelines. Current medications: lisinopril 20 mg by mouth once daily, metoprolol tartrate 100 mg by mouth twice daily, torsemide 40 mg by mouth twice daily + potassium repletion 60 mEq once daily , and metolazone 5 mg by  mouth as needed + additional potassium repletion 20 mEq daily  Other important details: serum creatinine improved; has obstructive sleep apnea on CPAP Intolerances: none Taking medications as directed: yes Side effects thought to be attributed to current medication regimen: no Recent home blood pressure readings: not discussed today Encourage dietary sodium restriction/DASH diet. Recommend regular aerobic exercise Recommend home blood pressure monitoring, to bring results in next visit Discussed need for and importance of continued work on weight loss Continue current medications as above. Continue diuretics per cardiology.   Heart failure with preserved ejection fraction (LVEF >50% with evidence of spontaneous or provokable increased LV filling pressures) - Condition stable. Not addressed this visit.: Appropriately managed; Has pacemaker Current treatment: lisinopril 20 mg by mouth once daily, metoprolol tartrate 100 mg by mouth twice daily, torsemide 40 mg by mouth twice daily + potassium repletion 60 mEq once daily , and metolazone 5 mg by mouth as needed + additional repletion 20 mEq daily Other important details: serum creatinine improved, Jardiance currently on hold per cardiology due to urinary tract infection.  Stage C (Symptomatic heart failure)/NYHA Class III (Marked limitation of physical activity. Comfortable at rest. Less than ordinary activity causes fatigue, palpitation, or dyspnea) Most recent echocardiogram was in March 2022. LVEF was stable at 60-65%. BNP: 67 (01/30/21) Current home weights: mostly stable 470-480s Reports shortness of breath with activity or when lying down, fatigue and weakness, swelling in the legs, ankles and feet, and reduced ability to exercise. Denies persistent cough or wheezing, nausea and lack of appetite, and chest pain Encourage dietary sodium restriction (<3 g/day) Educated on the importance of weighing daily. Patient aware to contact  cardiology/primary care team if weight gain >3 lbs in 1 day  or >5 lbs in 1 week Recommend home blood pressure monitoring, to bring results in next visit Discussed need for and importance of continued work on weight loss Recommend restricted fluid intake (eg, 2 L/day) Remain off Jardiance per cardiology Continue beta blocker, ACE inhibitor, and diuretics + potassium repletion  Chronic Obstructive Pulmonary Disease - Condition stable. Not addressed this visit.: Stable now status post nose surgery; Patient follows with Dr. Elsworth Soho for CPAP  Stopped smoking 08/23/2017; smoked for 30 years (1 PPD) Current treatment: albuterol metered dose (ProAir, Ventolin, Proventil) 1 puff by mouth as needed for shortness of breath and tiotropium (Spiriva Respimat) 2 inhalations once daily  Patient is approved for Spiriva through [place] patient assistance program through 05/21/22 Patient reports only using albuterol a couple times per week but often sounds out of breath which is likely complicated by his heart failure GOLD Classification: unknown CAT score (12/28/20): 23 Most recent Pulmonary Function Testing: N/A 0 exacerbations requiring treatment in the last 6 months  Current oxygen requirements: none Continue Spiriva 2 puffs once daily and albuterol as needed for shortness of breath   Patient was previously instructed on the appropriate inhaler technique for Spiriva Respimat  Atrial Fibrillation - Condition stable. Not addressed this visit.: Controlled Current rate control: metoprolol tartrate 100 mg by mouth twice daily Most recent ECG: normal sinus Anticoagulation: rivaroxaban (Xarelto) 20 mg by mouth once daily Denies signs and symptoms of bleeding CHADS2VASc score: 3 (CHF, hypertension, and diabetes) Prior ablation: '[]'  Yes  '[x]'  No Home blood pressure: 130s/80s Home heart rate: unknown Encouraged regular physical activity for general health benefits Recommend home blood pressure and heart rate  monitoring, to bring results in next visit Patient may qualify for patient assistance for Xarelto after he has spent at least 4% of his gross annual income at the pharmacy. Continue metoprolol tartrate 100 mg by mouth twice daily and rivaroxaban (Xarelto) 20 mg by mouth once daily  Overweight/Obesity - New goal.: Unable to achieve goal weight loss through lifestyle modification alone. Weight complicated by several other factors including heart failure Current treatment: semaglutide (Rybelsus) 14 mg by mouth daily Baseline weight: 485 lbs; most recent weight: 484 lbs Recommend that the patient emphasize lean proteins, fruits and vegetables, whole grains and increased fiber consumption, adequate hydration Per discussion with primary care provider, will begin transition from Rybelsus to [place]. Since patient is taking Rybelsus 14 mg daily, will transition patient to Ozempic 1 mg subcutaneously once weekly x4 weeks then increase to 60m subcutaneously once weekly thereafter if patient able to tolerate. Pres[facility] for both doses [facility]. Anticipate therapy change in ~4 weeks.   Depression and Anxiety - Condition stable. Not addressed this visit.: Controlled per patient Current medications: bupropion (Wellbutrin) 300 mg by mouth once daily and hydroxyzine 25 mg by mouth every 8 hours Current non-pharmacologic treatment: None, patient declines talk therapy at this time despite encouragement to consider Current symptoms include: anhedonia, depressed mood, and fatigue Continue current medications as above  Patient Goals/Self-Care Activities Patient will:  Take medications as prescribed Check glucose twice daily (fasting and bedtime), document, and provide at future appointments Check blood pressure at least once daily, document, and provide at future appointments Weigh daily, and contact provider if weight gain of 3 lbs in a day or more than 5 lbs  in a week  Follow Up Plan: Telephone follow up appointment with care management team member scheduled for: 09/06/21  Medication Assistance:  see care plan  Patient's preferred pharmacy is:  [facility] [address], [place] [address] Phone: [phone] Fax: [phone]  [facility] [place], [place] [address] Phone: [phone] Fax: [place], [place]. [place] [address] Phone: [phone] Fax: [phone] [facility], [place] [address] [place] [address] Phone: [phone] Fax: [phone]  Follow Up:  Patient agrees to Care Plan and Follow-up.  Plan: Telephone follow up appointment with care management team member scheduled for:  09/06/21  Kennon Holter, PharmD, [place], [place] Clinical Pharmacist Practitioner [facility] [phone]

## 2021-07-12 ENCOUNTER — Ambulatory Visit (HOSPITAL_COMMUNITY): Payer: Medicare HMO | Admitting: Physical Therapy

## 2021-07-13 ENCOUNTER — Other Ambulatory Visit: Payer: Self-pay | Admitting: Internal Medicine

## 2021-07-14 ENCOUNTER — Ambulatory Visit (HOSPITAL_COMMUNITY): Payer: Medicare HMO | Admitting: Physical Therapy

## 2021-07-18 ENCOUNTER — Ambulatory Visit (HOSPITAL_COMMUNITY): Payer: Medicare HMO | Admitting: Physical Therapy

## 2021-07-18 ENCOUNTER — Other Ambulatory Visit: Payer: Self-pay | Admitting: Internal Medicine

## 2021-07-18 DIAGNOSIS — I5032 Chronic diastolic (congestive) heart failure: Secondary | ICD-10-CM

## 2021-07-18 DIAGNOSIS — I872 Venous insufficiency (chronic) (peripheral): Secondary | ICD-10-CM

## 2021-07-18 DIAGNOSIS — D509 Iron deficiency anemia, unspecified: Secondary | ICD-10-CM

## 2021-07-18 MED ORDER — MISC. DEVICES MISC: 0 refills | Status: AC

## 2021-07-19 ENCOUNTER — Emergency Department (HOSPITAL_COMMUNITY): Payer: Medicare HMO

## 2021-07-19 ENCOUNTER — Inpatient Hospital Stay (HOSPITAL_COMMUNITY)
Admission: EM | Admit: 2021-07-19 | Discharge: 2021-07-21 | DRG: 683 | Disposition: A | Discharge status: Home Health | Payer: Medicare HMO | Attending: Family Medicine | Admitting: Family Medicine

## 2021-07-19 ENCOUNTER — Encounter (HOSPITAL_COMMUNITY): Payer: Self-pay | Admitting: Emergency Medicine

## 2021-07-19 ENCOUNTER — Other Ambulatory Visit: Payer: Self-pay

## 2021-07-19 DIAGNOSIS — E860 Dehydration: Secondary | ICD-10-CM | POA: Diagnosis present

## 2021-07-19 DIAGNOSIS — F32A Depression, unspecified: Secondary | ICD-10-CM | POA: Diagnosis present

## 2021-07-19 DIAGNOSIS — I10 Essential (primary) hypertension: Secondary | ICD-10-CM | POA: Diagnosis not present

## 2021-07-19 DIAGNOSIS — Z794 Long term (current) use of insulin: Secondary | ICD-10-CM

## 2021-07-19 DIAGNOSIS — Z8249 Family history of ischemic heart disease and other diseases of the circulatory system: Secondary | ICD-10-CM

## 2021-07-19 DIAGNOSIS — J449 Chronic obstructive pulmonary disease, unspecified: Secondary | ICD-10-CM

## 2021-07-19 DIAGNOSIS — Z953 Presence of xenogenic heart valve: Secondary | ICD-10-CM

## 2021-07-19 DIAGNOSIS — Z8679 Personal history of other diseases of the circulatory system: Secondary | ICD-10-CM

## 2021-07-19 DIAGNOSIS — Z20822 Contact with and (suspected) exposure to covid-19: Secondary | ICD-10-CM | POA: Diagnosis present

## 2021-07-19 DIAGNOSIS — N178 Other acute kidney failure: Secondary | ICD-10-CM | POA: Diagnosis not present

## 2021-07-19 DIAGNOSIS — R9431 Abnormal electrocardiogram [ECG] [EKG]: Secondary | ICD-10-CM

## 2021-07-19 DIAGNOSIS — I252 Old myocardial infarction: Secondary | ICD-10-CM

## 2021-07-19 DIAGNOSIS — Z7901 Long term (current) use of anticoagulants: Secondary | ICD-10-CM

## 2021-07-19 DIAGNOSIS — E861 Hypovolemia: Secondary | ICD-10-CM | POA: Diagnosis present

## 2021-07-19 DIAGNOSIS — N179 Acute kidney failure, unspecified: Secondary | ICD-10-CM

## 2021-07-19 DIAGNOSIS — E1169 Type 2 diabetes mellitus with other specified complication: Secondary | ICD-10-CM

## 2021-07-19 DIAGNOSIS — D72829 Elevated white blood cell count, unspecified: Secondary | ICD-10-CM | POA: Diagnosis present

## 2021-07-19 DIAGNOSIS — R079 Chest pain, unspecified: Secondary | ICD-10-CM

## 2021-07-19 DIAGNOSIS — I130 Hypertensive heart and chronic kidney disease with heart failure and stage 1 through stage 4 chronic kidney disease, or unspecified chronic kidney disease: Secondary | ICD-10-CM | POA: Diagnosis present

## 2021-07-19 DIAGNOSIS — Z87440 Personal history of urinary (tract) infections: Secondary | ICD-10-CM

## 2021-07-19 DIAGNOSIS — Z79899 Other long term (current) drug therapy: Secondary | ICD-10-CM

## 2021-07-19 DIAGNOSIS — G4733 Obstructive sleep apnea (adult) (pediatric): Secondary | ICD-10-CM | POA: Diagnosis not present

## 2021-07-19 DIAGNOSIS — K820 Obstruction of gallbladder: Secondary | ICD-10-CM | POA: Diagnosis not present

## 2021-07-19 DIAGNOSIS — I4510 Unspecified right bundle-branch block: Secondary | ICD-10-CM | POA: Diagnosis present

## 2021-07-19 DIAGNOSIS — Z87891 Personal history of nicotine dependence: Secondary | ICD-10-CM

## 2021-07-19 DIAGNOSIS — I872 Venous insufficiency (chronic) (peripheral): Secondary | ICD-10-CM | POA: Diagnosis present

## 2021-07-19 DIAGNOSIS — Z833 Family history of diabetes mellitus: Secondary | ICD-10-CM

## 2021-07-19 DIAGNOSIS — Z8701 Personal history of pneumonia (recurrent): Secondary | ICD-10-CM

## 2021-07-19 DIAGNOSIS — I272 Pulmonary hypertension, unspecified: Secondary | ICD-10-CM | POA: Diagnosis not present

## 2021-07-19 DIAGNOSIS — I480 Paroxysmal atrial fibrillation: Secondary | ICD-10-CM

## 2021-07-19 DIAGNOSIS — R110 Nausea: Secondary | ICD-10-CM | POA: Diagnosis not present

## 2021-07-19 DIAGNOSIS — E1129 Type 2 diabetes mellitus with other diabetic kidney complication: Secondary | ICD-10-CM | POA: Diagnosis not present

## 2021-07-19 DIAGNOSIS — F339 Major depressive disorder, recurrent, unspecified: Secondary | ICD-10-CM | POA: Diagnosis not present

## 2021-07-19 DIAGNOSIS — I440 Atrioventricular block, first degree: Secondary | ICD-10-CM | POA: Diagnosis present

## 2021-07-19 DIAGNOSIS — I2510 Atherosclerotic heart disease of native coronary artery without angina pectoris: Secondary | ICD-10-CM | POA: Diagnosis present

## 2021-07-19 DIAGNOSIS — Z7984 Long term (current) use of oral hypoglycemic drugs: Secondary | ICD-10-CM

## 2021-07-19 DIAGNOSIS — N62 Hypertrophy of breast: Secondary | ICD-10-CM | POA: Diagnosis not present

## 2021-07-19 DIAGNOSIS — N170 Acute kidney failure with tubular necrosis: Secondary | ICD-10-CM | POA: Diagnosis not present

## 2021-07-19 DIAGNOSIS — D631 Anemia in chronic kidney disease: Secondary | ICD-10-CM | POA: Diagnosis not present

## 2021-07-19 DIAGNOSIS — E876 Hypokalemia: Secondary | ICD-10-CM | POA: Diagnosis present

## 2021-07-19 DIAGNOSIS — R519 Headache, unspecified: Secondary | ICD-10-CM | POA: Diagnosis not present

## 2021-07-19 DIAGNOSIS — X500XXA Overexertion from strenuous movement or load, initial encounter: Secondary | ICD-10-CM

## 2021-07-19 DIAGNOSIS — Z6841 Body Mass Index (BMI) 40.0 and over, adult: Secondary | ICD-10-CM

## 2021-07-19 DIAGNOSIS — D649 Anemia, unspecified: Secondary | ICD-10-CM | POA: Diagnosis not present

## 2021-07-19 DIAGNOSIS — R0789 Other chest pain: Secondary | ICD-10-CM | POA: Diagnosis not present

## 2021-07-19 DIAGNOSIS — N182 Chronic kidney disease, stage 2 (mild): Secondary | ICD-10-CM | POA: Diagnosis not present

## 2021-07-19 DIAGNOSIS — F419 Anxiety disorder, unspecified: Secondary | ICD-10-CM | POA: Diagnosis present

## 2021-07-19 DIAGNOSIS — R0602 Shortness of breath: Secondary | ICD-10-CM | POA: Diagnosis not present

## 2021-07-19 DIAGNOSIS — I9589 Other hypotension: Secondary | ICD-10-CM

## 2021-07-19 DIAGNOSIS — I959 Hypotension, unspecified: Secondary | ICD-10-CM | POA: Diagnosis not present

## 2021-07-19 DIAGNOSIS — Z9989 Dependence on other enabling machines and devices: Secondary | ICD-10-CM

## 2021-07-19 DIAGNOSIS — K921 Melena: Secondary | ICD-10-CM | POA: Diagnosis not present

## 2021-07-19 DIAGNOSIS — E119 Type 2 diabetes mellitus without complications: Secondary | ICD-10-CM

## 2021-07-19 DIAGNOSIS — E1122 Type 2 diabetes mellitus with diabetic chronic kidney disease: Secondary | ICD-10-CM | POA: Diagnosis present

## 2021-07-19 DIAGNOSIS — Z86711 Personal history of pulmonary embolism: Secondary | ICD-10-CM | POA: Diagnosis not present

## 2021-07-19 DIAGNOSIS — I442 Atrioventricular block, complete: Secondary | ICD-10-CM | POA: Diagnosis present

## 2021-07-19 DIAGNOSIS — D500 Iron deficiency anemia secondary to blood loss (chronic): Secondary | ICD-10-CM | POA: Diagnosis present

## 2021-07-19 DIAGNOSIS — M1990 Unspecified osteoarthritis, unspecified site: Secondary | ICD-10-CM | POA: Diagnosis present

## 2021-07-19 DIAGNOSIS — E872 Acidosis, unspecified: Secondary | ICD-10-CM | POA: Diagnosis present

## 2021-07-19 DIAGNOSIS — I5032 Chronic diastolic (congestive) heart failure: Secondary | ICD-10-CM

## 2021-07-19 DIAGNOSIS — M109 Gout, unspecified: Secondary | ICD-10-CM | POA: Diagnosis present

## 2021-07-19 DIAGNOSIS — R197 Diarrhea, unspecified: Secondary | ICD-10-CM | POA: Diagnosis not present

## 2021-07-19 DIAGNOSIS — I455 Other specified heart block: Secondary | ICD-10-CM | POA: Diagnosis present

## 2021-07-19 DIAGNOSIS — N1831 Chronic kidney disease, stage 3a: Secondary | ICD-10-CM | POA: Diagnosis present

## 2021-07-19 DIAGNOSIS — I4891 Unspecified atrial fibrillation: Secondary | ICD-10-CM | POA: Diagnosis not present

## 2021-07-19 DIAGNOSIS — N1832 Chronic kidney disease, stage 3b: Secondary | ICD-10-CM | POA: Diagnosis not present

## 2021-07-19 DIAGNOSIS — Z950 Presence of cardiac pacemaker: Secondary | ICD-10-CM

## 2021-07-19 DIAGNOSIS — E785 Hyperlipidemia, unspecified: Secondary | ICD-10-CM | POA: Diagnosis present

## 2021-07-19 HISTORY — DX: Atherosclerotic heart disease of native coronary artery without angina pectoris: I25.10

## 2021-07-19 LAB — CBC
HCT: 29.7 % — ABNORMAL LOW (ref 39.0–52.0)
Hemoglobin: 9 g/dL — ABNORMAL LOW (ref 13.0–17.0)
MCH: 24.7 pg — ABNORMAL LOW (ref 26.0–34.0)
MCHC: 30.3 g/dL (ref 30.0–36.0)
MCV: 81.4 fL (ref 80.0–100.0)
Platelets: 314 10*3/uL (ref 150–400)
RBC: 3.65 MIL/uL — ABNORMAL LOW (ref 4.22–5.81)
RDW: 17.9 % — ABNORMAL HIGH (ref 11.5–15.5)
WBC: 11.7 10*3/uL — ABNORMAL HIGH (ref 4.0–10.5)
nRBC: 0 % (ref 0.0–0.2)

## 2021-07-19 LAB — TROPONIN I (HIGH SENSITIVITY)
Troponin I (High Sensitivity): 19 ng/L — ABNORMAL HIGH (ref <18)
Troponin I (High Sensitivity): 18 ng/L — ABNORMAL HIGH (ref <18)

## 2021-07-19 LAB — LACTIC ACID, PLASMA
Lactic Acid, Venous: 3 mmol/L (ref 0.5–1.9)
Lactic Acid, Venous: 2.1 mmol/L (ref 0.5–1.9)

## 2021-07-19 LAB — BASIC METABOLIC PANEL
Anion gap: 15 (ref 5–15)
BUN: 81 mg/dL — ABNORMAL HIGH (ref 6–20)
CO2: 22 mmol/L (ref 22–32)
Calcium: 8.6 mg/dL — ABNORMAL LOW (ref 8.9–10.3)
Chloride: 98 mmol/L (ref 98–111)
Creatinine, Ser: 4.07 mg/dL — ABNORMAL HIGH (ref 0.61–1.24)
GFR, Estimated: 17 mL/min — ABNORMAL LOW (ref >60)
Glucose, Bld: 106 mg/dL — ABNORMAL HIGH (ref 70–99)
Potassium: 4.2 mmol/L (ref 3.5–5.1)
Sodium: 135 mmol/L (ref 135–145)

## 2021-07-19 LAB — URINALYSIS, ROUTINE W REFLEX MICROSCOPIC
Bilirubin Urine: NEGATIVE
Glucose, UA: NEGATIVE mg/dL
Hgb urine dipstick: NEGATIVE
Ketones, ur: NEGATIVE mg/dL
Leukocytes,Ua: NEGATIVE
Nitrite: NEGATIVE
Protein, ur: NEGATIVE mg/dL
Specific Gravity, Urine: 1.008 (ref 1.005–1.030)
pH: 5 (ref 5.0–8.0)

## 2021-07-19 LAB — RESP PANEL BY RT-PCR (FLU A&B, COVID) ARPGX2
Influenza A by PCR: NEGATIVE
Influenza B by PCR: NEGATIVE
SARS Coronavirus 2 by RT PCR: NEGATIVE

## 2021-07-19 LAB — HEPATIC FUNCTION PANEL
ALT: 19 U/L (ref 0–44)
AST: 36 U/L (ref 15–41)
Albumin: 3.7 g/dL (ref 3.5–5.0)
Alkaline Phosphatase: 71 U/L (ref 38–126)
Bilirubin, Direct: 0.2 mg/dL (ref 0.0–0.2)
Indirect Bilirubin: 0.5 mg/dL (ref 0.3–0.9)
Total Bilirubin: 0.7 mg/dL (ref 0.3–1.2)
Total Protein: 8.2 g/dL — ABNORMAL HIGH (ref 6.5–8.1)

## 2021-07-19 LAB — CREATININE, URINE, RANDOM: Creatinine, Urine: 73.02 mg/dL

## 2021-07-19 LAB — CBG MONITORING, ED: Glucose-Capillary: 108 mg/dL — ABNORMAL HIGH (ref 70–99)

## 2021-07-19 LAB — SODIUM, URINE, RANDOM: Sodium, Ur: 64 mmol/L

## 2021-07-19 LAB — D-DIMER, QUANTITATIVE: D-Dimer, Quant: 0.31 ug/mL-FEU (ref 0.00–0.50)

## 2021-07-19 MED ORDER — TIOTROPIUM BROMIDE MONOHYDRATE 2.5 MCG/ACT IN AERS: 2.0000 | INHALATION_SPRAY | Freq: Every day | RESPIRATORY_TRACT | Status: DC

## 2021-07-19 MED ORDER — TAMSULOSIN HCL 0.4 MG PO CAPS
0.4000 mg | ORAL_CAPSULE | Freq: Every day | ORAL | Status: DC
  Administered 2021-07-20 – 2021-07-21 (×2): 0.4 mg via ORAL
  Filled 2021-07-19 (×2): qty 1

## 2021-07-19 MED ORDER — PANTOPRAZOLE SODIUM 40 MG PO TBEC: 40.0000 mg | DELAYED_RELEASE_TABLET | Freq: Every day | ORAL | Status: DC

## 2021-07-19 MED ORDER — ACETAMINOPHEN 325 MG PO TABS
650.0000 mg | ORAL_TABLET | Freq: Four times a day (QID) | ORAL | Status: DC | PRN
Start: 2021-07-19 — End: 2021-07-21
  Administered 2021-07-19: 650 mg via ORAL
  Filled 2021-07-19: qty 2

## 2021-07-19 MED ORDER — GABAPENTIN 100 MG PO CAPS
200.0000 mg | ORAL_CAPSULE | Freq: Every day | ORAL | Status: DC
  Administered 2021-07-19 – 2021-07-20 (×2): 200 mg via ORAL
  Filled 2021-07-19 (×2): qty 2

## 2021-07-19 MED ORDER — ALBUTEROL SULFATE (2.5 MG/3ML) 0.083% IN NEBU: 2.5000 mg | INHALATION_SOLUTION | Freq: Four times a day (QID) | RESPIRATORY_TRACT | Status: DC | PRN

## 2021-07-19 MED ORDER — HYDROXYZINE HCL 25 MG PO TABS: 25.0000 mg | ORAL_TABLET | Freq: Three times a day (TID) | ORAL | Status: DC | PRN

## 2021-07-19 MED ORDER — UMECLIDINIUM BROMIDE 62.5 MCG/ACT IN AEPB
1.0000 | INHALATION_SPRAY | Freq: Every day | RESPIRATORY_TRACT | Status: DC
  Administered 2021-07-20 – 2021-07-21 (×2): 1 via RESPIRATORY_TRACT
  Filled 2021-07-19: qty 7

## 2021-07-19 MED ORDER — ALLOPURINOL 300 MG PO TABS
300.0000 mg | ORAL_TABLET | Freq: Every day | ORAL | Status: DC
  Administered 2021-07-20 – 2021-07-21 (×2): 300 mg via ORAL
  Filled 2021-07-19 (×2): qty 1

## 2021-07-19 MED ORDER — ACETAMINOPHEN 650 MG RE SUPP: 650.0000 mg | Freq: Four times a day (QID) | RECTAL | Status: DC | PRN

## 2021-07-19 MED ORDER — INSULIN ASPART 100 UNIT/ML IJ SOLN: 0.0000 [IU] | Freq: Three times a day (TID) | INTRAMUSCULAR | Status: DC

## 2021-07-19 MED ORDER — PANTOPRAZOLE SODIUM 40 MG IV SOLR
40.0000 mg | Freq: Once | INTRAVENOUS | Status: AC
  Administered 2021-07-19: 40 mg via INTRAVENOUS
  Filled 2021-07-19: qty 10

## 2021-07-19 MED ORDER — SODIUM CHLORIDE 0.9 % IV BOLUS
500.0000 mL | Freq: Once | INTRAVENOUS | Status: AC
  Administered 2021-07-19: 500 mL via INTRAVENOUS

## 2021-07-19 MED ORDER — SODIUM CHLORIDE 0.9 % IV SOLN: INTRAVENOUS | Status: DC

## 2021-07-19 MED ORDER — ATORVASTATIN CALCIUM 40 MG PO TABS
40.0000 mg | ORAL_TABLET | Freq: Every day | ORAL | Status: DC
Start: 2021-07-20 — End: 2021-07-21
  Administered 2021-07-20 – 2021-07-21 (×2): 40 mg via ORAL
  Filled 2021-07-19 (×2): qty 1

## 2021-07-19 MED ORDER — SODIUM CHLORIDE 0.9 % IV BOLUS
1000.0000 mL | Freq: Once | INTRAVENOUS | Status: AC
  Administered 2021-07-19: 1000 mL via INTRAVENOUS

## 2021-07-19 MED ORDER — ASPIRIN 325 MG PO TABS
325.0000 mg | ORAL_TABLET | Freq: Once | ORAL | Status: AC
Start: 2021-07-19 — End: 2021-07-19
  Administered 2021-07-19: 325 mg via ORAL
  Filled 2021-07-19: qty 1

## 2021-07-19 NOTE — Telephone Encounter (Signed)
Spoke with patient who describes chest pain and pressure as stabbing in the chest and back and shoulders. States that he can not walk to the restroom without becoming SOB. Pt reports having nausea and diarrhea. Pt encouraged to be seen in the ER for eval.

## 2021-07-19 NOTE — ED Notes (Signed)
Patient transported to CT 

## 2021-07-19 NOTE — ED Provider Notes (Signed)
[facility] EMERGENCY DEPARTMENT Provider Note   CSN: [id number] Arrival date & time: 07/19/21  1508     History  Chief Complaint  Patient presents with   Chest Pain    Reginald [name] is a 51 y.o. male.  HPI  Patient with medical history including diabetes, hypertension, hyperlipidemia, A-fib currently on Xarelto, CHF presents to the emergency department chief complaint of chest pain.  Patient's chest pain started  2 days ago, started after he was lifting up a shed, states he feels pain in the middle of his chest and his left arm, pain is worsened with exertion improved with rest, he states he also feels short of breath, patient is chronically short of breath but this feels slightly worse, denies any pleuritic chest pain orthopnea or worsening peripheral edema, states that he has been compliant with his Xarelto.  States the pain is not radiating to his back, not a stabbing or tearing like sensation, he currently has no chest pain at this time while he is laying down, it is only worsen when he stands up and moves his his arms.  Pain does not radiate down his arm does not going up into his chin, does not become diaphoretic, nauseous or lightheaded or dizziness.  He denies any stomach pains constipation diarrhea no melena or hematochezia, has no other complaints.  Reviewed patient's chart followed by cardiology and had a negative cardiac cath in 2019, currently on Xarelto for his A-fib.  Also followed by gastroenterology, has intermittent melena, had colonoscopy performed back in 2022 shows internal/external hemorrhoids, but no other acute abnormalities, he also had a capsule swelled which was also negative for acute findings.  recommended that if he needed a colonoscopy would have to go to a tertiary center due to his weight.  Home Medications Prior to Admission medications   Medication Sig Start Date End Date Taking? Authorizing Provider  acetaminophen (TYLENOL) 325 MG tablet Take 650 mg by  mouth every 6 (six) hours as needed for mild pain.    [provider]  albuterol (PROVENTIL) (2.5 MG/3ML) 0.083% nebulizer solution INHALE THE CONTENTS OF 1 VIAL VIA NEBULIZER EVERY 6 HOURS AS NEEDED FOR WHEEZING OR SHORTNESS OF BREATH 07/13/21   [name], MD  albuterol (VENTOLIN HFA) 108 (90 Base) MCG/ACT inhaler INHALE 2 PUFFS INTO THE LUNGS EVERY 6 (SIX) HOURS AS NEEDED FOR WHEEZING OR SHORTNESS OF BREATH. 06/01/21   [name], MD  allopurinol (ZYLOPRIM) 300 MG tablet TAKE 1 TABLET EVERY DAY 04/06/21   [name], MD  atorvastatin (LIPITOR) 40 MG tablet TAKE 1 TABLET EVERY DAY 02/14/21   [name], MD  Blood Glucose Monitoring Suppl (TRUE METRIX METER) w/Device KIT USE AS DIRECTED 08/19/20   [name], MD  buPROPion (WELLBUTRIN XL) 300 MG 24 hr tablet Take 1 tablet (300 mg total) by mouth daily. 02/17/21   [name], MD  Cholecalciferol 125 MCG (5000 UT) TABS Take 5,000 Units by mouth daily.    [provider]  docusate sodium (COLACE) 100 MG capsule Take 2 capsules (200 mg total) by mouth 2 (two) times daily. Patient taking differently: Take 200 mg by mouth daily. 11/15/20   Thurnell Lose, MD  empagliflozin (JARDIANCE) 10 MG TABS tablet Take 1 tablet (10 mg total) by mouth daily before breakfast. Patient not taking: Reported on 07/06/2021 01/12/21   Erma Heritage, PA-C  FEROSUL 325 (65 Fe) MG tablet TAKE 1 TABLET EVERY DAY 07/18/21  [name], MD  fexofenadine (ALLEGRA) 180 MG tablet Take 180 mg by mouth daily.    [provider]  gabapentin (NEURONTIN) 100 MG capsule TAKE 1 CAPSULE IN THE MORNING AND TAKE 2 CAPSULES IN THE EVENING 03/09/21   [name], MD  glipiZIDE (GLUCOTROL XL) 5 MG 24 hr tablet Take 1 tablet by mouth once daily with breakfast 06/28/21   [name], MD  hydrOXYzine (ATARAX/VISTARIL) 25 MG tablet Take 1 tablet (25 mg total) by mouth every 8 (eight) hours as needed. 02/03/21   [name], MD   Krill Oil 350 MG CAPS Take 350 mg by mouth daily.    [provider]  lisinopril (ZESTRIL) 20 MG tablet Take 1 tablet (20 mg total) by mouth daily. 05/13/21   [name], MD  metFORMIN (GLUCOPHAGE) 850 MG tablet TAKE 1 TABLET TWICE DAILY WITH MEALS 03/11/21   [name], MD  metolazone (ZAROXOLYN) 5 MG tablet TAKE 1 TABLET (5 MG TOTAL) BY MOUTH THREE TIMES A WEEK. Patient taking differently: Take 5 mg by mouth See admin instructions. TAKE 1 TABLET (5 MG TOTAL) BY MOUTH THREE TIMES A WEEK AS NEEDED 01/26/21   Allred, Jeneen Rinks, MD  metoprolol tartrate (LOPRESSOR) 100 MG tablet TAKE 1 TABLET TWICE DAILY 12/27/20   Josue Hector, MD  Misc. Devices MISC Sequential compression device - 1 pair. Apply them for 1 hour in the morning and 1 hour in the evening. 07/18/21   [name], MD  pantoprazole (PROTONIX) 40 MG tablet TAKE 1 TABLET EVERY DAY 05/05/21   [name], MD  potassium chloride SA (KLOR-CON) 20 MEQ tablet TAKE 3 TABS DAILY AND TAKE AN ADDITIONAL 1 TAB ON THE DAY YOU TAKE YOUR WEEKLY METOLAZONE DOSE 12/22/20   Ahmed Prima, Fransisco Hertz, PA-C  Semaglutide (RYBELSUS) 14 MG TABS Take 14 mg by mouth in the morning. 02/08/21   [name], MD  tamsulosin (FLOMAX) 0.4 MG CAPS capsule Take 1 capsule (0.4 mg total) by mouth daily. 04/05/21   Stoneking, Reece Leader., MD  Tiotropium Bromide Monohydrate (SPIRIVA RESPIMAT) 2.5 MCG/ACT AERS Inhale 2 puffs into the lungs daily. 02/03/21   [name], MD  torsemide (DEMADEX) 20 MG tablet Take 2 tablets (40 mg total) by mouth 2 (two) times daily. 02/04/21   Strader, Fransisco Hertz, PA-C  TRUE METRIX BLOOD GLUCOSE TEST test strip TEST ONE TIME DAILY FOR DIABETES 10/25/20   [name], MD  TRUEplus Lancets 33G MISC USE TO TEST ONE TIME DAILY FOR DIABETES 10/25/20   [name], MD  TURMERIC-GINGER PO Take 2 each by mouth daily.    [provider]      Allergies    Patient has no known allergies.    Review of Systems   Review of  Systems  Constitutional:  Negative for chills and fever.  Respiratory:  Positive for shortness of breath.   Cardiovascular:  Positive for chest pain.  Gastrointestinal:  Negative for abdominal pain.  Neurological:  Negative for headaches.   Physical Exam Updated Vital Signs BP 104/76    Pulse 77    Temp 98.1 F (36.7 C) (Oral)    Resp 17    SpO2 100%  Physical Exam Vitals and nursing note reviewed. Exam conducted with a chaperone present.  Constitutional:      General: He is not in acute distress.    Appearance: He is not ill-appearing.  HENT:     Head: Normocephalic  and atraumatic.     Nose: No congestion.  Eyes:     Conjunctiva/sclera: Conjunctivae normal.  Cardiovascular:     Rate and Rhythm: Normal rate and regular rhythm.     Pulses: Normal pulses.     Heart sounds: No murmur heard.   No friction rub. No gallop.  Pulmonary:     Effort: No respiratory distress.     Breath sounds: No wheezing, rhonchi or rales.     Comments: Patient's chest pain is focalized and reproducible beneath the left nipple.  Abdominal:     Palpations: Abdomen is soft.     Tenderness: There is no abdominal tenderness. There is no right CVA tenderness or left CVA tenderness.  Genitourinary:    Comments: With chaperone present rectal exam was performed noted external hemorrhoids nonthrombosed at the 2 o'clock position, patient Hemoccult was positive but no melena or hematuria stool was a normal brown color. Musculoskeletal:     Right lower leg: No edema.     Left lower leg: No edema.  Skin:    General: Skin is warm and dry.  Neurological:     Mental Status: He is alert.  Psychiatric:        Mood and Affect: Mood normal.    ED Results / Procedures / Treatments   Labs (all labs ordered are listed, but only abnormal results are displayed) Labs Reviewed  BASIC METABOLIC PANEL - Abnormal; Notable for the following components:      Result Value   Glucose, Bld 106 (*)    BUN 81 (*)     Creatinine, Ser 4.07 (*)    Calcium 8.6 (*)    GFR, Estimated 17 (*)    All other components within normal limits  CBC - Abnormal; Notable for the following components:   WBC 11.7 (*)    RBC 3.65 (*)    Hemoglobin 9.0 (*)    HCT 29.7 (*)    MCH 24.7 (*)    RDW 17.9 (*)    All other components within normal limits  LACTIC ACID, PLASMA - Abnormal; Notable for the following components:   Lactic Acid, Venous 3.0 (*)    All other components within normal limits  LACTIC ACID, PLASMA - Abnormal; Notable for the following components:   Lactic Acid, Venous 2.1 (*)    All other components within normal limits  HEPATIC FUNCTION PANEL - Abnormal; Notable for the following components:   Total Protein 8.2 (*)    All other components within normal limits  POC OCCULT BLOOD, ED - Abnormal  CBG MONITORING, ED - Abnormal; Notable for the following components:   Glucose-Capillary 108 (*)    All other components within normal limits  TROPONIN I (HIGH SENSITIVITY) - Abnormal; Notable for the following components:   Troponin I (High Sensitivity) 19 (*)    All other components within normal limits  TROPONIN I (HIGH SENSITIVITY) - Abnormal; Notable for the following components:   Troponin I (High Sensitivity) 18 (*)    All other components within normal limits  RESP PANEL BY RT-PCR (FLU A&B, COVID) ARPGX2  CULTURE, BLOOD (ROUTINE X 2)  CULTURE, BLOOD (ROUTINE X 2)  URINALYSIS, ROUTINE W REFLEX MICROSCOPIC  D-DIMER, QUANTITATIVE  SODIUM, URINE, RANDOM  CREATININE, URINE, RANDOM  BASIC METABOLIC PANEL  CBC  UREA NITROGEN, URINE  LACTIC ACID, PLASMA  TYPE AND SCREEN    EKG EKG Interpretation  Date/Time:  Tuesday July 19 2021 16:00:43 EST Ventricular Rate:  79 PR Interval:  214 QRS Duration: 171 QT Interval:  431 QTC Calculation: 495 R Axis:   123 Text Interpretation: Right bundle branch block similar to prior tracing Confirmed by Wynona Dove ([id number]) on 07/19/2021 4:55:07  PM  Radiology DG Chest 2 View  Result Date: 07/19/2021 CLINICAL DATA:  Chest pain with ambulation for 2 days, shortness of breath, headache, back pain EXAM: CHEST - 2 VIEW COMPARISON:  03/24/2021 FINDINGS: Frontal and lateral views of the chest demonstrate stable postsurgical changes from median sternotomy. 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 Mild enlargement the cardiac silhouette. Chronic central vascular prominence without superimposed airspace disease, effusion, or pneumothorax. No acute bony abnormalities. IMPRESSION: 1. Stable chest, no acute process. Electronically Signed   By: Randa Ngo M.D.   On: 07/19/2021 15:45   CT CHEST ABDOMEN PELVIS WO CONTRAST  Result Date: 07/19/2021 CLINICAL DATA:  Aortic aneurysm, known or suspected. Pt c/o chest pain, SOB and dyspnea upon exertion. Also c/o diarrhea and nausea EXAM: CT CHEST, ABDOMEN AND PELVIS WITHOUT CONTRAST TECHNIQUE: Multidetector CT imaging of the chest, abdomen and pelvis was performed following the standard protocol without IV contrast. RADIATION DOSE REDUCTION: This exam was performed according to the departmental dose-optimization program which includes automated exposure control, adjustment of the mA and/or kV according to patient size and/or use of iterative reconstruction technique. COMPARISON:  CT renal 03/24/2021 FINDINGS: CT CHEST FINDINGS Cardiovascular: Normal heart size. No significant pericardial effusion. The thoracic aorta is normal in caliber. At least mild atherosclerotic plaque of the thoracic aorta. At least 2 vessel coronary artery calcifications. Status post aortic valve replacement. Mediastinum/Nodes: No gross hilar adenopathy, noting limited sensitivity for the detection of hilar adenopathy on this noncontrast study. A borderline enlarged 1 cm right paratracheal lymph node (2:18). No enlarged or axillary lymph nodes. Thyroid gland, trachea, and esophagus demonstrate no significant findings. Lungs/Pleura: No focal  consolidation. No pulmonary nodule. No pulmonary mass. No pleural effusion. No pneumothorax. Musculoskeletal: Left chest wall dual lead cardiac pacemaker in grossly appropriate position. Marked bilateral gynecomastia. No chest wall mass or suspicious bone lesions identified. CT ABDOMEN PELVIS FINDINGS Hepatobiliary: The liver is enlarged measuring up to 20 8 cm. No focal liver abnormality. Gallbladder is contracted. No gallstones, gallbladder wall thickening, or pericholecystic fluid. No biliary dilatation. Pancreas: No focal lesion. Normal pancreatic contour. No surrounding inflammatory changes. No main pancreatic ductal dilatation. Spleen: Enlarged measuring up to 15 cm. Evaluation for focal density. Adrenals/Urinary Tract: No adrenal nodule bilaterally. No nephrolithiasis and no hydronephrosis. No definite contour-deforming renal mass. No ureterolithiasis or hydroureter. The urinary bladder is unremarkable. Stomach/Bowel: Stomach is within normal limits. No evidence of bowel wall thickening or dilatation. Appendix appears normal. Vascular/Lymphatic: No abdominal aorta or iliac aneurysm. Mild atherosclerotic plaque of the aorta and its branches. No abdominal, pelvic, or inguinal lymphadenopathy. Reproductive: Prostate is unremarkable. Other: No intraperitoneal free fluid. No intraperitoneal free gas. No organized fluid collection. Musculoskeletal: No abdominal wall hernia or abnormality. No suspicious lytic or blastic osseous lesions. No acute displaced fracture. Multilevel degenerative changes of the spine with intervertebral disc space vacuum phenomenon the L2-L3 and L3-L4 levels. IMPRESSION: 1. Hepatosplenomegaly. Markedly limited evaluation on this noncontrast study. 2. Nonspecific borderline enlarged right paratracheal lymph node (1 cm). 3. Aortic Atherosclerosis (ICD10-I70.0). Status post aortic valve repair. 4. Marked bilateral gynecomastia. Electronically Signed   By: Iven Finn M.D.   On: 07/19/2021  19:07    Procedures .Critical Care Performed by: Marcello Fennel, PA-C Authorized by: Marcello Fennel, PA-C   Critical  care provider statement:    Critical care time (minutes):  60   Critical care time was exclusive of:  Separately billable procedures and treating other patients   Critical care was necessary to treat or prevent imminent or life-threatening deterioration of the following conditions:  Circulatory failure   Critical care was time spent personally by me on the following activities:  Development of treatment plan with patient or surrogate, discussions with consultants, evaluation of patient's response to treatment, examination of patient, ordering and review of laboratory studies, ordering and review of radiographic studies, pulse oximetry, re-evaluation of patient's condition and review of old charts   I assumed direction of critical care for this patient from another provider in my specialty: no     Care discussed with: admitting provider      Medications Ordered in ED Medications  allopurinol (ZYLOPRIM) tablet 300 mg (has no administration in time range)  atorvastatin (LIPITOR) tablet 40 mg (has no administration in time range)  hydrOXYzine (ATARAX) tablet 25 mg (has no administration in time range)  pantoprazole (PROTONIX) EC tablet 40 mg (has no administration in time range)  tamsulosin (FLOMAX) capsule 0.4 mg (has no administration in time range)  albuterol (PROVENTIL) (2.5 MG/3ML) 0.083% nebulizer solution 2.5 mg (has no administration in time range)  gabapentin (NEURONTIN) capsule 200 mg (200 mg Oral Given 07/19/21 2321)  insulin aspart (novoLOG) injection 0-6 Units (has no administration in time range)  acetaminophen (TYLENOL) tablet 650 mg (650 mg Oral Given 07/19/21 2333)    Or  acetaminophen (TYLENOL) suppository 650 mg ( Rectal See Alternative 07/19/21 2333)  0.9 %  sodium chloride infusion ( Intravenous New Bag/Given 07/19/21 2320)  umeclidinium bromide  (INCRUSE ELLIPTA) 62.5 MCG/ACT 1 puff (has no administration in time range)  sodium chloride 0.9 % bolus 1,000 mL (0 mLs Intravenous Stopped 07/19/21 1758)  aspirin tablet 325 mg (325 mg Oral Given 07/19/21 1714)  pantoprazole (PROTONIX) injection 40 mg (40 mg Intravenous Given 07/19/21 1850)  sodium chloride 0.9 % bolus 500 mL (0 mLs Intravenous Stopped 07/19/21 2319)    ED Course/ Medical Decision Making/ A&P                           Medical Decision Making Amount and/or Complexity of Data Reviewed Labs: ordered. Radiology: ordered.  Risk OTC drugs. Prescription drug management. Decision regarding hospitalization.   This patient presents to the ED for concern of chest pain, this involves an extensive number of treatment options, and is a complaint that carries with it a high risk of complications and morbidity.  The differential diagnosis includes dissection, ACS, PE, sepsis    Additional history obtained:  Additional history obtained from electronic medical record External records from outside source obtained and reviewed including please see HPI for further detail   Co morbidities that complicate the patient evaluation  Obesity, A-fib, anticoagulated  Social Determinants of Health:  Admitting    Lab Tests:  I Ordered, and personally interpreted labs.  The pertinent results include: CBC shows slight leukocytosis 11.7, no significant hemoglobin 9 and appears to be around his baseline, BMP shows BUN of 81 creatinine 4.07 baseline about 1.5 GFR 17, hepatic panel unremarkable, for troponin is 19-second opponent is 18, lactic 3, second lactic 2.1 UA unremarkable   Imaging Studies ordered:  I ordered imaging studies including CT abdomen pelvis without contrast I independently visualized and interpreted imaging which showed chest x-ray unremarkable, CT abdomen pelvis negative for  acute findings. I agree with the radiologist interpretation   Cardiac Monitoring:  The  patient was maintained on a cardiac monitor.  I personally viewed and interpreted the cardiac monitored which showed an underlying rhythm of: EKG sinus rhythm without signs of ischemia   Medicines ordered and prescription drug management:  I ordered medication including fluids for hypotension I have reviewed the patients home medicines and have made adjustments as needed  Critical Interventions:  Patient was hypotensive on arrival given fluid resuscitation and was monitored.   Reevaluation:  Presented with hypotension and chest pain prior with a liter of fluids and is reassessed BP is starting to improve we will continue to monitor.  Patient's hemoglobin was slightly lower, Hemoccult was positive but I suspect this is likely from his external hemorrhoids will consult with GI for further recommendations  Patient had an elevated troponin but it is downtrending, he has no chest pain at this time, will consult cardiology for further recommendations.  Patient D-dimer is negative, he is found resting company, BP has improved he is agreeable for admission  Consultations Obtained:  I requested consultation with the gastroenterologist Dr. Jenetta Downer,  and discussed lab and imaging findings as well as pertinent plan - they recommend: Does not feel that hypotension related to GI bleed as he would have expected black tarry stools or gross amount hematochezia as well as a bigger decrease in his hemoglobin likely a secondary cause no further recommendation at this time. Spoke with cardiology Dr. Harl Bowie recommends admission for cardiac rule out concern for possible dissection but atypical symptoms  recommend getting a D-dimer if negative unlikely he has a dissection at this time.  His team will round him out on the morning Spoke with Dr. Myna Hidalgo will admit the patient.  Rule out I have low suspicious for sepsis at this time as he is afebrile, nontachycardic, he is nontoxic-appearing.  He does have an  elevated white count as well as lactic but I suspect this is more secondary due to dehydration as his BP has improved as well as his lactic after resuscitation, there is also no infectious source seen on his CT abdomen pelvis.  I have low suspicion for MI at this time as he has no ST elevation, he has a downtrending troponin chest pain is positional and reproducible.  It is noted that his elevated this is possible secondary due to poor kidney function versus possible angina we will need further cardiac evaluation.  Low suspicion for PE as his D-dimer is negative.  Low suspicion for dissection as presentation atypical positional reproducible, he has a negative D-dimer.  I have low suspicion for GI bleed as he has no frank hematochezia, no melena, only small drop in his hemoglobin.  He does have a positive Hemoccult but I suspect this positive from his external hemorrhoids.    Dispostion and problem list  After consideration of the diagnostic results and the patients response to treatment, I feel that the patent would benefit from admission.  AKI-unclear etiology but I suspect prerenal likely dehydration will need further fluid resuscitation and continued monitoring. Chest pain since resolved-unclear urology possible muscular versus angina will need further follow-up with cardiology. Positive Hemoccult-likely from external hemorrhoids but will need continued monitoring.            Final Clinical Impression(s) / ED Diagnoses Final diagnoses:  AKI (acute kidney injury) ([place])  Nonspecific chest pain    Rx / DC Orders ED Discharge Orders     None  Marcello Fennel, PA-C 07/19/21 2341    Jeanell Sparrow, DO 07/20/21 1806

## 2021-07-19 NOTE — H&P (Signed)
History and Physical    NYSHAUN [name] MEQ:[id number] DOB: 26-May-1970 DOA: 07/19/2021  PCP: [name], MD   Patient coming from: Home   Chief Complaint: Chest pain, fatigue   HPI: Reginald [name] is a pleasant 51 y.o. male with medical history significant for BMI 67, PAF on Xarelto, history of PE, history of severe aortic stenosis and ascending aortic aneurysm status post aneurysm repair and bioprosthetic AVR, renal insufficiency, HFpEF, COPD, OSA on CPAP, type 2 diabetes mellitus, and chronic blood loss anemia, presenting to the emergency department for evaluation of chest pain and fatigue.  Patient reports that he had been lifting something heavy a couple days ago when he developed pain in the central chest.  Initially, pain resolved after 30 minutes but has returned when he is active and when he moves his arms.  He reports increased exertional dyspnea and fatigue as well but denies any cough, wheezing, fever, or chills.  He has had several watery stools today but not yesterday.  He denies abdominal pain, nausea, or vomiting.  He reports occasional dark stool but none in the past couple days.  States that he is scheduled to see hematology on 07/29/2021 for his anemia and scheduled to see gastroenterology at [facility] on April 14 for the same reason.  ED Course: Upon arrival to the ED, patient is found to be afebrile and saturating well on room air with normal heart rate and hypotension with systolic blood pressure as low as 73.  EKG features sinus rhythm with first-degree AV nodal block, RBBB, and prolonged QT interval.  Chest x-ray negative for acute cardiopulmonary disease.  CT of the chest/abdomen/pelvis without contrast is negative for hydronephrosis or acute intrathoracic finding but notable for hepatomegaly.  Chemistry panel features a BUN of 81 and creatinine 4.07.  CBC notable for mild leukocytosis and hemoglobin 9.0.  Lactic acid went from 3.0-2.1.  Troponin was 19 and then 18.  D-dimer was  normal.  ED PA reports that stool was grossly normal but FOBT positive.  The PA discussed the case with cardiology and GI, treated the patient with 325 mg of aspirin, IV PPI, and 1 L of saline, and hospitalist asked to admit.  Review of Systems:  All other systems reviewed and apart from HPI, are negative.  Past Medical History:  Diagnosis Date   Anemia    Anxiety    Aortic stenosis    Arthritis    Asthma    Back pain    Cellulitis of skin with lymphangitis    CHF (congestive heart failure) (HCC)    Chronic diastolic congestive heart failure (HCC)    Chronic venous insufficiency    Constipation    COPD (chronic obstructive pulmonary disease) (HCC)    Coronary artery disease with history of myocardial infarction without history of CABG    Depression    Diabetes (HCC)    Dyspnea    Essential hypertension    Gout    Heart murmur    Hypertension    Morbid obesity ([place])    Obesity    Pneumonia    walking pneumonia   Prostatitis    Pulmonary embolism ([place])    Pulmonary hypertension (HCC)    Pyelonephritis    S/P aortic valve replacement with bioprosthetic valve 08/23/2017   25 mm Edwards Inspiris Resilia stented bovine pericardial tissue valve   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   24 mm Hemashield supracoronary straight graft    Sleep apnea    cpap  Thoracic ascending aortic aneurysm    Thoracic ascending aortic aneurysm    Tobacco abuse     Past Surgical History:  Procedure Laterality Date   AORTIC VALVE REPLACEMENT N/A 08/23/2017   Procedure: AORTIC VALVE REPLACEMENT (AVR) USING INSPIRIS RESILIA AORTIC VALVE SIZE 25 MM;  Surgeon: Rexene Alberts, MD;  Location: [place];  Service: Open Heart Surgery;  Laterality: N/A;   CARDIAC VALVE REPLACEMENT N/A    Phreesia 02/07/2020   COLONOSCOPY WITH PROPOFOL N/A 11/14/2020   Procedure: COLONOSCOPY WITH PROPOFOL;  Surgeon: Gatha Mayer, MD;  Location: [facility] ENDOSCOPY;  Service: Endoscopy;  Laterality: N/A;    ESOPHAGOGASTRODUODENOSCOPY (EGD) WITH PROPOFOL N/A 11/14/2020   Procedure: ESOPHAGOGASTRODUODENOSCOPY (EGD) WITH PROPOFOL;  Surgeon: Gatha Mayer, MD;  Location: [place];  Service: Endoscopy;  Laterality: N/A;   GIVENS CAPSULE STUDY N/A 06/30/2021   Procedure: GIVENS CAPSULE STUDY;  Surgeon: Harvel Quale, MD;  [facility];  Service: Gastroenterology;  Laterality: N/A;  7:30   MULTIPLE EXTRACTIONS WITH ALVEOLOPLASTY N/A 07/23/2017   Procedure: Extraction of tooth #10 with alveoloplasty and gross debridement of remaining teeth;  Surgeon: Lenn Cal, DDS;  Location: [place];  Service: Oral Surgery;  Laterality: N/A;   NASAL SEPTOPLASTY W/ TURBINOPLASTY Bilateral 07/01/2021   Procedure: NASAL SEPTOPLASTY WITH TURBINATE REDUCTION;  Surgeon: Leta Baptist, MD;  Location: [place];  Service: ENT;  Laterality: Bilateral;   PACEMAKER IMPLANT N/A 08/28/2017    St Jude Medical Assurity MRI conditional  dual-chamber pacemaker for symptomatic complete heart blockby Dr Rayann Heman   TEE WITHOUT CARDIOVERSION N/A 07/16/2017   Procedure: TRANSESOPHAGEAL ECHOCARDIOGRAM (TEE);  Surgeon: Lelon Perla, MD;  Location: [facility] ENDOSCOPY;  Service: Cardiovascular;  Laterality: N/A;   TEE WITHOUT CARDIOVERSION N/A 08/23/2017   Procedure: TRANSESOPHAGEAL ECHOCARDIOGRAM (TEE);  Surgeon: Rexene Alberts, MD;  Location: [place];  Service: Open Heart Surgery;  Laterality: N/A;   THORACIC AORTIC ANEURYSM REPAIR N/A 08/23/2017   Procedure: THORACIC ASCENDING ANEURYSM REPAIR (AAA) USING HEMASHIELD GOLD KNITTED MICROVEL DOUBLE VELOUR VASCULAR GRAFT D: 24 MM  L: 30 CM;  Surgeon: Rexene Alberts, MD;  Location: [place];  Service: Open Heart Surgery;  Laterality: N/A;    Social History:   reports that he quit smoking about 3 years ago. His smoking use included cigarettes. He has a 32.00 pack-year smoking history. He has never used smokeless tobacco. He reports that he does not drink alcohol and does not use  drugs.  No Known Allergies  Family History  Problem Relation Age of Onset   Hypertension Mother    Alzheimer's disease Mother    Heart attack Mother    Heart attack Brother    Diabetes Brother      Prior to Admission medications   Medication Sig Start Date End Date Taking? Authorizing Provider  acetaminophen (TYLENOL) 325 MG tablet Take 650 mg by mouth every 6 (six) hours as needed for mild pain.    [provider]  albuterol (PROVENTIL) (2.5 MG/3ML) 0.083% nebulizer solution INHALE THE CONTENTS OF 1 VIAL VIA NEBULIZER EVERY 6 HOURS AS NEEDED FOR WHEEZING OR SHORTNESS OF BREATH 07/13/21   [name], MD  albuterol (VENTOLIN HFA) 108 (90 Base) MCG/ACT inhaler INHALE 2 PUFFS INTO THE LUNGS EVERY 6 (SIX) HOURS AS NEEDED FOR WHEEZING OR SHORTNESS OF BREATH. 06/01/21   [name], MD  allopurinol (ZYLOPRIM) 300 MG tablet TAKE 1 TABLET EVERY DAY 04/06/21   [name], MD  atorvastatin (LIPITOR)  40 MG tablet TAKE 1 TABLET EVERY DAY 02/14/21   [name], MD  Blood Glucose Monitoring Suppl (TRUE METRIX METER) w/Device KIT USE AS DIRECTED 08/19/20   [name], MD  buPROPion (WELLBUTRIN XL) 300 MG 24 hr tablet Take 1 tablet (300 mg total) by mouth daily. 02/17/21   [name], MD  Cholecalciferol 125 MCG (5000 UT) TABS Take 5,000 Units by mouth daily.    [provider]  docusate sodium (COLACE) 100 MG capsule Take 2 capsules (200 mg total) by mouth 2 (two) times daily. Patient taking differently: Take 200 mg by mouth daily. 11/15/20   Thurnell Lose, MD  empagliflozin (JARDIANCE) 10 MG TABS tablet Take 1 tablet (10 mg total) by mouth daily before breakfast. Patient not taking: Reported on 07/06/2021 01/12/21   Erma Heritage, PA-C  FEROSUL 325 (65 Fe) MG tablet TAKE 1 TABLET EVERY DAY 07/18/21   [name], MD  fexofenadine (ALLEGRA) 180 MG tablet Take 180 mg by mouth daily.    [provider]  gabapentin (NEURONTIN) 100 MG capsule  TAKE 1 CAPSULE IN THE MORNING AND TAKE 2 CAPSULES IN THE EVENING 03/09/21   [name], MD  glipiZIDE (GLUCOTROL XL) 5 MG 24 hr tablet Take 1 tablet by mouth once daily with breakfast 06/28/21   [name], MD  hydrOXYzine (ATARAX/VISTARIL) 25 MG tablet Take 1 tablet (25 mg total) by mouth every 8 (eight) hours as needed. 02/03/21   [name], MD  Krill Oil 350 MG CAPS Take 350 mg by mouth daily.    [provider]  lisinopril (ZESTRIL) 20 MG tablet Take 1 tablet (20 mg total) by mouth daily. 05/13/21   [name], MD  metFORMIN (GLUCOPHAGE) 850 MG tablet TAKE 1 TABLET TWICE DAILY WITH MEALS 03/11/21   [name], MD  metolazone (ZAROXOLYN) 5 MG tablet TAKE 1 TABLET (5 MG TOTAL) BY MOUTH THREE TIMES A WEEK. Patient taking differently: Take 5 mg by mouth See admin instructions. TAKE 1 TABLET (5 MG TOTAL) BY MOUTH THREE TIMES A WEEK AS NEEDED 01/26/21   Allred, Jeneen Rinks, MD  metoprolol tartrate (LOPRESSOR) 100 MG tablet TAKE 1 TABLET TWICE DAILY 12/27/20   Josue Hector, MD  Misc. Devices MISC Sequential compression device - 1 pair. Apply them for 1 hour in the morning and 1 hour in the evening. 07/18/21   [name], MD  pantoprazole (PROTONIX) 40 MG tablet TAKE 1 TABLET EVERY DAY 05/05/21   [name], MD  potassium chloride SA (KLOR-CON) 20 MEQ tablet TAKE 3 TABS DAILY AND TAKE AN ADDITIONAL 1 TAB ON THE DAY YOU TAKE YOUR WEEKLY METOLAZONE DOSE 12/22/20   Ahmed Prima, Fransisco Hertz, PA-C  Semaglutide (RYBELSUS) 14 MG TABS Take 14 mg by mouth in the morning. 02/08/21   [name], MD  tamsulosin (FLOMAX) 0.4 MG CAPS capsule Take 1 capsule (0.4 mg total) by mouth daily. 04/05/21   Stoneking, Reece Leader., MD  Tiotropium Bromide Monohydrate (SPIRIVA RESPIMAT) 2.5 MCG/ACT AERS Inhale 2 puffs into the lungs daily. 02/03/21   [name], MD  torsemide (DEMADEX) 20 MG tablet Take 2 tablets (40 mg total) by mouth 2 (two) times daily. 02/04/21   Strader, Fransisco Hertz, PA-C  TRUE  METRIX BLOOD GLUCOSE TEST test strip TEST ONE TIME DAILY FOR DIABETES 10/25/20   [name], MD  TRUEplus Lancets 33G MISC USE TO TEST ONE TIME DAILY FOR DIABETES 10/25/20  [name], MD  TURMERIC-GINGER PO Take 2 each by mouth daily.    [provider]    Physical Exam: Vitals:   07/19/21 1805 07/19/21 1845 07/19/21 1930 07/19/21 2015  BP: (!) 85/61 (!) 86/42 (!) 92/56 114/60  Pulse: 79 79 82 83  Resp: 18 18 (!) 23 20  Temp:      TempSrc:      SpO2: 100% 97% 100% 100%    Constitutional: NAD, calm  Eyes: PERTLA, lids and conjunctivae normal ENMT: Mucous membranes are moist. Posterior pharynx clear of any exudate or lesions.   Neck: supple, no masses  Respiratory: no wheezing, no crackles. No accessory muscle use.  Cardiovascular: S1 & S2 heard, regular rate and rhythm. Bilateral lower leg edema.    Abdomen: No distension, no tenderness, soft. Bowel sounds active.  Musculoskeletal: no clubbing / cyanosis. Central chest tenderness.   Skin: no significant rashes, lesions, ulcers. Warm, dry, well-perfused. Neurologic: CN 2-12 grossly intact. Moving all extremities. Alert and oriented.  Psychiatric: Calm. Cooperative.    Labs and Imaging on Admission: I have personally reviewed following labs and imaging studies  CBC: Recent Labs  Lab 07/19/21 1602  WBC 11.7*  HGB 9.0*  HCT 29.7*  MCV 81.4  PLT 295   Basic Metabolic Panel: Recent Labs  Lab 07/19/21 1602  NA 135  K 4.2  CL 98  CO2 22  GLUCOSE 106*  BUN 81*  CREATININE 4.07*  CALCIUM 8.6*   GFR: CrCl cannot be calculated (Unknown ideal weight.). Liver Function Tests: Recent Labs  Lab 07/19/21 1815  AST 36  ALT 19  ALKPHOS 71  BILITOT 0.7  PROT 8.2*  ALBUMIN 3.7   No results for input(s): LIPASE, AMYLASE in the last 168 hours. No results for input(s): AMMONIA in the last 168 hours. Coagulation Profile: No results for input(s): INR, PROTIME in the last 168 hours. Cardiac Enzymes: No  results for input(s): CKTOTAL, CKMB, CKMBINDEX, TROPONINI in the last 168 hours. BNP (last 3 results) No results for input(s): PROBNP in the last 8760 hours. HbA1C: No results for input(s): HGBA1C in the last 72 hours. CBG: No results for input(s): GLUCAP in the last 168 hours. Lipid Profile: No results for input(s): CHOL, HDL, LDLCALC, TRIG, CHOLHDL, LDLDIRECT in the last 72 hours. Thyroid Function Tests: No results for input(s): TSH, T4TOTAL, FREET4, T3FREE, THYROIDAB in the last 72 hours. Anemia Panel: No results for input(s): VITAMINB12, FOLATE, FERRITIN, TIBC, IRON, RETICCTPCT in the last 72 hours. Urine analysis:    Component Value Date/Time   COLORURINE YELLOW 07/19/2021 2016   APPEARANCEUR CLEAR 07/19/2021 2016   APPEARANCEUR Clear 06/01/2021 1558   LABSPEC 1.008 07/19/2021 2016   PHURINE 5.0 07/19/2021 2016   GLUCOSEU NEGATIVE 07/19/2021 2016   HGBUR NEGATIVE 07/19/2021 2016   BILIRUBINUR NEGATIVE 07/19/2021 2016   BILIRUBINUR Negative 06/01/2021 [place] 07/19/2021 2016   PROTEINUR NEGATIVE 07/19/2021 2016   UROBILINOGEN 0.2 03/10/2020 0926   UROBILINOGEN 2.0 (H) 03/08/2019 1123   NITRITE NEGATIVE 07/19/2021 2016   LEUKOCYTESUR NEGATIVE 07/19/2021 2016   Sepsis Labs: '@LABRCNTIP' (procalcitonin:4,lacticidven:4) ) Recent Results (from the past 240 hour(s))  Resp Panel by RT-PCR (Flu A&B, Covid) Nasopharyngeal Swab     Status: None   Collection Time: 07/19/21  5:27 PM   Specimen: Nasopharyngeal Swab; Nasopharyngeal(NP) swabs in vial transport medium  Result Value Ref Range Status   SARS Coronavirus 2 by RT PCR NEGATIVE NEGATIVE Final    Comment: (NOTE) SARS-CoV-2 target  nucleic acids are NOT DETECTED.  The SARS-CoV-2 RNA is generally detectable in upper respiratory specimens during the acute phase of infection. The lowest concentration of SARS-CoV-2 viral copies this assay can detect is 138 copies/mL. A negative result does not preclude  SARS-Cov-2 infection and should not be used as the sole basis for treatment or other patient management decisions. A negative result may occur with  improper specimen collection/handling, submission of specimen other than nasopharyngeal swab, presence of viral mutation(s) within the areas targeted by this assay, and inadequate number of viral copies(<138 copies/mL). A negative result must be combined with clinical observations, patient history, and epidemiological information. The expected result is Negative.  Fact Sheet for Patients:  EntrepreneurPulse.com.au  Fact Sheet for Healthcare Providers:  IncredibleEmployment.be  This test is no t yet approved or cleared by the [place] FDA and  has been authorized for detection and/or diagnosis of SARS-CoV-2 by FDA under an Emergency Use Authorization (EUA). This EUA will remain  in effect (meaning this test can be used) for the duration of the COVID-19 declaration under Section 564(b)(1) of the Act, 21 U.S.C.section 360bbb-3(b)(1), unless the authorization is terminated  or revoked sooner.       Influenza A by PCR NEGATIVE NEGATIVE Final   Influenza B by PCR NEGATIVE NEGATIVE Final    Comment: (NOTE) The Xpert Xpress SARS-CoV-2/FLU/RSV plus assay is intended as an aid in the diagnosis of influenza from Nasopharyngeal swab specimens and should not be used as a sole basis for treatment. Nasal washings and aspirates are unacceptable for Xpert Xpress SARS-CoV-2/FLU/RSV testing.  Fact Sheet for Patients: EntrepreneurPulse.com.au  Fact Sheet for Healthcare Providers: IncredibleEmployment.be  This test is not yet approved or cleared by the [place] FDA and has been authorized for detection and/or diagnosis of SARS-CoV-2 by FDA under an Emergency Use Authorization (EUA). This EUA will remain in effect (meaning this test can be used) for the duration of  the COVID-19 declaration under Section 564(b)(1) of the Act, 21 U.S.C. section 360bbb-3(b)(1), unless the authorization is terminated or revoked.  Performed at [facility], [address], [place], [place] [address]   Blood culture (routine x 2)     Status: None (Preliminary result)   Collection Time: 07/19/21  6:15 PM   Specimen: BLOOD LEFT HAND  Result Value Ref Range Status   Specimen Description BLOOD LEFT HAND  Final   Special Requests   Final    BOTTLES DRAWN AEROBIC AND ANAEROBIC Blood Culture adequate volume Performed at [facility], [address] [place], [place] [address]    Culture PENDING  Incomplete   Report Status PENDING  Incomplete  Blood culture (routine x 2)     Status: None (Preliminary result)   Collection Time: 07/19/21  6:15 PM   Specimen: Right Antecubital; Blood  Result Value Ref Range Status   Specimen Description RIGHT ANTECUBITAL  Final   Special Requests   Final    BOTTLES DRAWN AEROBIC AND ANAEROBIC Blood Culture results may not be optimal due to an excessive volume of blood received in culture bottles Performed at [facility], [address] [place], [place] [address]    Culture PENDING  Incomplete   Report Status PENDING  Incomplete     Radiological Exams on Admission: DG Chest 2 View  Result Date: 07/19/2021 CLINICAL DATA:  Chest pain with ambulation for 2 days, shortness of breath, headache, back pain EXAM: CHEST - 2 VIEW COMPARISON:  03/24/2021 FINDINGS: Frontal and lateral views of the  chest demonstrate stable postsurgical changes from median sternotomy. 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 Mild enlargement the cardiac silhouette. Chronic central vascular prominence without superimposed airspace disease, effusion, or pneumothorax. No acute bony abnormalities. IMPRESSION: 1. Stable chest, no acute process. Electronically Signed   By: Randa Ngo M.D.   On: 07/19/2021 15:45   CT CHEST ABDOMEN PELVIS WO CONTRAST  Result Date: 07/19/2021 CLINICAL  DATA:  Aortic aneurysm, known or suspected. Pt c/o chest pain, SOB and dyspnea upon exertion. Also c/o diarrhea and nausea EXAM: CT CHEST, ABDOMEN AND PELVIS WITHOUT CONTRAST TECHNIQUE: Multidetector CT imaging of the chest, abdomen and pelvis was performed following the standard protocol without IV contrast. RADIATION DOSE REDUCTION: This exam was performed according to the departmental dose-optimization program which includes automated exposure control, adjustment of the mA and/or kV according to patient size and/or use of iterative reconstruction technique. COMPARISON:  CT renal 03/24/2021 FINDINGS: CT CHEST FINDINGS Cardiovascular: Normal heart size. No significant pericardial effusion. The thoracic aorta is normal in caliber. At least mild atherosclerotic plaque of the thoracic aorta. At least 2 vessel coronary artery calcifications. Status post aortic valve replacement. Mediastinum/Nodes: No gross hilar adenopathy, noting limited sensitivity for the detection of hilar adenopathy on this noncontrast study. A borderline enlarged 1 cm right paratracheal lymph node (2:18). No enlarged or axillary lymph nodes. Thyroid gland, trachea, and esophagus demonstrate no significant findings. Lungs/Pleura: No focal consolidation. No pulmonary nodule. No pulmonary mass. No pleural effusion. No pneumothorax. Musculoskeletal: Left chest wall dual lead cardiac pacemaker in grossly appropriate position. Marked bilateral gynecomastia. No chest wall mass or suspicious bone lesions identified. CT ABDOMEN PELVIS FINDINGS Hepatobiliary: The liver is enlarged measuring up to 20 8 cm. No focal liver abnormality. Gallbladder is contracted. No gallstones, gallbladder wall thickening, or pericholecystic fluid. No biliary dilatation. Pancreas: No focal lesion. Normal pancreatic contour. No surrounding inflammatory changes. No main pancreatic ductal dilatation. Spleen: Enlarged measuring up to 15 cm. Evaluation for focal density.  Adrenals/Urinary Tract: No adrenal nodule bilaterally. No nephrolithiasis and no hydronephrosis. No definite contour-deforming renal mass. No ureterolithiasis or hydroureter. The urinary bladder is unremarkable. Stomach/Bowel: Stomach is within normal limits. No evidence of bowel wall thickening or dilatation. Appendix appears normal. Vascular/Lymphatic: No abdominal aorta or iliac aneurysm. Mild atherosclerotic plaque of the aorta and its branches. No abdominal, pelvic, or inguinal lymphadenopathy. Reproductive: Prostate is unremarkable. Other: No intraperitoneal free fluid. No intraperitoneal free gas. No organized fluid collection. Musculoskeletal: No abdominal wall hernia or abnormality. No suspicious lytic or blastic osseous lesions. No acute displaced fracture. Multilevel degenerative changes of the spine with intervertebral disc space vacuum phenomenon the L2-L3 and L3-L4 levels. IMPRESSION: 1. Hepatosplenomegaly. Markedly limited evaluation on this noncontrast study. 2. Nonspecific borderline enlarged right paratracheal lymph node (1 cm). 3. Aortic Atherosclerosis (ICD10-I70.0). Status post aortic valve repair. 4. Marked bilateral gynecomastia. Electronically Signed   By: Iven Finn M.D.   On: 07/19/2021 19:07    EKG: Independently reviewed. Sinus rhythm, 1st degree AV block, RBBB, QTc 502 ms.   Assessment/Plan   1. 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 Hypotension - SBP as low as 70s in ED, improved to 90s after a liter of  IVF - No fever or apparent infection, no overt bleeding, d-dimer low - Responding to IVF, hypovolemia seems most likely  - Hold diuretics and antihypertensives, continue IVF, culture if febrile, monitor  blood counts    3. Chest pain  - Presents with 2 days of CP that started after heavy lifting, is reproducible, and most likely MSK  - Troponin was 19 then 18 in ED, d-dimer low, no acute EKG changes noted, and no acute findings on chest CT    4. PAF  - In SR in ED  - Hold Xarelto initially in light of low GFR and acute on chronic anemia with occult GIB   5. HFpEF    - He is hypotensive in ED, difficult to determine volume status d/t body habitus but suspected to be hypovolemic  - Hold diuretics, monitor wt and I/Os, hold ACE and beta-blocker in light of hypotension and AKI    6. Anemia - Hgb is 9.0, continuing slow downtrend  - ED PA reports grossly normal stool that was FOBT+  - He had EGD and colonoscopy in June 2022 with hemorrhoids but no other potential etiology identified, underwent more recent capsule study and unsure of results but reports consultation with [facility] scheduled for April  - Hold Xarelto initially while trending    7. COPD; OSA  - Stable, no cough or wheezing on admission  - Continue Spiriva, CPAP qHS    8. Type II DM  - A1c was 5.7% in Feb 2023  - Check CBGs and use low-intensity SSI for now    9. Prolonged QT  - Continue cardiac monitoring, check magnesium, avoid QT-prolonging medications   10. Hx of PE  - D-dimer normal in ED  - Xarelto held initially given low GFR and decreased Hgb and positive FOBT    DVT prophylaxis: SCDs   Code Status: Full  Level of Care: Level of care: Telemetry Family Communication: None present  Disposition Plan:  Patient is from: home  Anticipated d/c is to: TBD Anticipated d/c date is: 07/22/21  Patient currently: Pending improved renal function, stable BP, stable H&H  Consults called: ED discussed with cardiology and GI  Admission status: Inpatient     Vianne Bulls, MD [facility]  07/19/2021, 9:33 PM

## 2021-07-19 NOTE — ED Triage Notes (Signed)
Pt c/o pain to chest with walking and movement x 2 days. Sob as well. Color wnl. C/o headche and back pain.

## 2021-07-20 ENCOUNTER — Other Ambulatory Visit (HOSPITAL_COMMUNITY): Payer: Self-pay | Admitting: *Deleted

## 2021-07-20 ENCOUNTER — Ambulatory Visit (HOSPITAL_COMMUNITY): Payer: Medicare HMO | Admitting: Physical Therapy

## 2021-07-20 ENCOUNTER — Inpatient Hospital Stay (HOSPITAL_COMMUNITY): Payer: Medicare HMO

## 2021-07-20 DIAGNOSIS — N178 Other acute kidney failure: Secondary | ICD-10-CM

## 2021-07-20 DIAGNOSIS — K921 Melena: Secondary | ICD-10-CM

## 2021-07-20 DIAGNOSIS — D649 Anemia, unspecified: Secondary | ICD-10-CM

## 2021-07-20 DIAGNOSIS — Z6841 Body Mass Index (BMI) 40.0 and over, adult: Secondary | ICD-10-CM

## 2021-07-20 DIAGNOSIS — R079 Chest pain, unspecified: Secondary | ICD-10-CM | POA: Diagnosis not present

## 2021-07-20 DIAGNOSIS — I4891 Unspecified atrial fibrillation: Secondary | ICD-10-CM

## 2021-07-20 DIAGNOSIS — I959 Hypotension, unspecified: Secondary | ICD-10-CM

## 2021-07-20 LAB — ECHOCARDIOGRAM COMPLETE
AR max vel: 1.09 cm2
AV Area VTI: 0.99 cm2
AV Area mean vel: 1.19 cm2
AV Mean grad: 18 mmHg
AV Peak grad: 35.9 mmHg
Ao pk vel: 3 m/s
Area-P 1/2: 3.76 cm2
Height: 71 in
MV VTI: 1.74 cm2
S' Lateral: 3.5 cm
Weight: 7894.23 oz

## 2021-07-20 LAB — BASIC METABOLIC PANEL
Anion gap: 13 (ref 5–15)
BUN: 74 mg/dL — ABNORMAL HIGH (ref 6–20)
CO2: 23 mmol/L (ref 22–32)
Calcium: 8.6 mg/dL — ABNORMAL LOW (ref 8.9–10.3)
Chloride: 99 mmol/L (ref 98–111)
Creatinine, Ser: 2.75 mg/dL — ABNORMAL HIGH (ref 0.61–1.24)
GFR, Estimated: 27 mL/min — ABNORMAL LOW (ref >60)
Glucose, Bld: 139 mg/dL — ABNORMAL HIGH (ref 70–99)
Potassium: 3.3 mmol/L — ABNORMAL LOW (ref 3.5–5.1)
Sodium: 135 mmol/L (ref 135–145)

## 2021-07-20 LAB — CBC
HCT: 28.1 % — ABNORMAL LOW (ref 39.0–52.0)
Hemoglobin: 8.3 g/dL — ABNORMAL LOW (ref 13.0–17.0)
MCH: 24.3 pg — ABNORMAL LOW (ref 26.0–34.0)
MCHC: 29.5 g/dL — ABNORMAL LOW (ref 30.0–36.0)
MCV: 82.4 fL (ref 80.0–100.0)
Platelets: 275 10*3/uL (ref 150–400)
RBC: 3.41 MIL/uL — ABNORMAL LOW (ref 4.22–5.81)
RDW: 17.9 % — ABNORMAL HIGH (ref 11.5–15.5)
WBC: 8.3 10*3/uL (ref 4.0–10.5)
nRBC: 0 % (ref 0.0–0.2)

## 2021-07-20 LAB — GLUCOSE, CAPILLARY
Glucose-Capillary: 119 mg/dL — ABNORMAL HIGH (ref 70–99)
Glucose-Capillary: 86 mg/dL (ref 70–99)
Glucose-Capillary: 136 mg/dL — ABNORMAL HIGH (ref 70–99)
Glucose-Capillary: 115 mg/dL — ABNORMAL HIGH (ref 70–99)

## 2021-07-20 LAB — LACTIC ACID, PLASMA
Lactic Acid, Venous: 5 mmol/L (ref 0.5–1.9)
Lactic Acid, Venous: 2.9 mmol/L (ref 0.5–1.9)

## 2021-07-20 LAB — PROCALCITONIN: Procalcitonin: <0.1 ng/mL

## 2021-07-20 MED ORDER — POTASSIUM CHLORIDE CRYS ER 20 MEQ PO TBCR: 20.0000 meq | EXTENDED_RELEASE_TABLET | Freq: Once | ORAL | Status: DC

## 2021-07-20 MED ORDER — PANTOPRAZOLE SODIUM 40 MG PO TBEC
40.0000 mg | DELAYED_RELEASE_TABLET | Freq: Two times a day (BID) | ORAL | Status: DC
  Administered 2021-07-20 – 2021-07-21 (×3): 40 mg via ORAL
  Filled 2021-07-20 (×3): qty 1

## 2021-07-20 MED ORDER — LACTATED RINGERS IV SOLN: INTRAVENOUS | Status: DC

## 2021-07-20 MED ORDER — PERFLUTREN LIPID MICROSPHERE
1.0000 mL | INTRAVENOUS | Status: AC | PRN
  Administered 2021-07-20: 5 mL via INTRAVENOUS
  Filled 2021-07-20: qty 10

## 2021-07-20 MED ORDER — LACTATED RINGERS IV BOLUS
1000.0000 mL | Freq: Once | INTRAVENOUS | Status: AC
  Administered 2021-07-20: 1000 mL via INTRAVENOUS

## 2021-07-20 MED ORDER — DOCUSATE SODIUM 100 MG PO CAPS
100.0000 mg | ORAL_CAPSULE | Freq: Every day | ORAL | Status: DC
  Administered 2021-07-20 – 2021-07-21 (×2): 100 mg via ORAL
  Filled 2021-07-20 (×2): qty 1

## 2021-07-20 MED ORDER — POTASSIUM CHLORIDE CRYS ER 20 MEQ PO TBCR
20.0000 meq | EXTENDED_RELEASE_TABLET | Freq: Two times a day (BID) | ORAL | Status: AC
  Administered 2021-07-20 (×2): 20 meq via ORAL
  Filled 2021-07-20 (×2): qty 1

## 2021-07-20 NOTE — Progress Notes (Signed)
Patient admitted to [facility] and ambulated from wc to bed. A & O X 4,  no c/o pain, VS stable. Placed on telemetry, continuous fluids hanging without IV pump due to no pump being available in the hospital "until morning", per portable equipment. Also, the patient coughed out a 4 X 3 mm clot of blood that he stated drained from his sinuses. He had recent sinus surgery two weeks ago and stated that he has a follow up appointment scheduled for Monday. ?

## 2021-07-20 NOTE — Consult Note (Signed)
Reason for Consult:AKI/CKD stage IIIa Referring Physician: Roger Shelter, MD  Reginald [name] is an 51 y.o. male has an extensive PMH most significant for morbid obesity (BMI 39), severe AS s/p bovine AVR and repair of ascending aortic aneurysm, heart block s/p permanent pacemaker,  atrial fibrillation (on Xarelto), chronic diastolic CHF, DM type 2, HTN, chronic blood loss anemia, and CKD stage IIIa who presented to [facility] ED with 2 day history of chest pain.  Pain radiated to his left arm and worse with exertion.  He also reports occasional dark stools.  In the ED he was hypotensive at 78/52, pulse 79, SpO2 98% on room air.  ECG with prolonged QT interval, first degree AV block, and RBBB.  CT of chest/abd/pelvis was negative except for hepatomegaly.  Labs were notable for BUN 81, Cr 4.07, Hgb 9, lactate 3.  Stool was FOBT +.  We were consulted to further evaluate and manage his AKI/CKD Stage IIIa.  The trend in Scr is seen below.  He denies any N/V/D, NSAID use, dysuria, pyuria, hematuria, urgency, frequency, or retention.  No recent IV contrast.  No history of kidney stones.  He was taking lisinopril, Jardiance, and metformin prior to admission.  UA was without blood, protein, or leukocytes.   Trend in Creatinine: Creatinine, Ser  Date/Time Value Ref Range Status  07/20/2021 05:40 AM 2.75 (H) 0.61 - 1.24 mg/dL Final  07/19/2021 04:02 PM 4.07 (H) 0.61 - 1.24 mg/dL Final  07/05/2021 01:08 PM 1.20 0.76 - 1.27 mg/dL Final  06/28/2021 10:24 AM 1.88 (H) 0.61 - 1.24 mg/dL Final  05/13/2021 10:01 AM 1.43 (H) 0.76 - 1.27 mg/dL Final  04/19/2021 02:05 PM 1.58 (H) 0.61 - 1.24 mg/dL Final  03/25/2021 05:39 AM 2.17 (H) 0.61 - 1.24 mg/dL Final  03/24/2021 02:31 PM 1.80 (H) 0.61 - 1.24 mg/dL Final  02/08/2021 09:03 AM 1.55 (H) 0.61 - 1.24 mg/dL Final  02/02/2021 02:20 PM 1.39 (H) 0.61 - 1.24 mg/dL Final  01/30/2021 04:43 PM 2.13 (H) 0.61 - 1.24 mg/dL Final  01/05/2021 12:46 PM 1.18 0.61 - 1.24 mg/dL Final  12/20/2020  12:38 PM 2.28 (H) 0.61 - 1.24 mg/dL Final  11/24/2020 08:31 AM 1.57 (H) 0.76 - 1.27 mg/dL Final  11/15/2020 01:17 AM 1.25 (H) 0.61 - 1.24 mg/dL Final  11/14/2020 12:52 AM 1.37 (H) 0.61 - 1.24 mg/dL Final  11/13/2020 01:28 AM 1.33 (H) 0.61 - 1.24 mg/dL Final  11/12/2020 11:32 AM 1.26 (H) 0.61 - 1.24 mg/dL Final  10/04/2020 09:43 AM 1.30 (H) 0.76 - 1.27 mg/dL Final  07/09/2020 08:09 PM 1.83 (H) 0.61 - 1.24 mg/dL Final  06/18/2020 03:14 PM 1.27 (H) 0.61 - 1.24 mg/dL Final  06/10/2020 08:37 AM 1.28 (H) 0.76 - 1.27 mg/dL Final  06/04/2020 12:35 PM 1.27 (H) 0.61 - 1.24 mg/dL Final  03/26/2020 11:18 AM 1.14 0.61 - 1.24 mg/dL Final  01/01/2020 10:14 AM 1.03 0.61 - 1.24 mg/dL Final  12/09/2019 10:32 AM 1.09 0.61 - 1.24 mg/dL Final  11/25/2019 08:44 AM 1.04 0.61 - 1.24 mg/dL Final  11/20/2019 10:08 AM 1.06 0.61 - 1.24 mg/dL Final  11/18/2019 09:49 AM 1.20 0.61 - 1.24 mg/dL Final  09/17/2019 02:42 PM 1.03 0.61 - 1.24 mg/dL Final  06/18/2019 09:15 AM 0.99 0.61 - 1.24 mg/dL Final  05/19/2019 11:08 AM 0.99 0.61 - 1.24 mg/dL Final  05/01/2019 03:04 PM 1.42 (H) 0.61 - 1.24 mg/dL Final  03/24/2019 03:11 PM 1.27 (H) 0.61 - 1.24 mg/dL Final  03/17/2019 06:48 AM 1.15 0.61 - 1.24  mg/dL Final  03/16/2019 05:27 AM 2.49 (H) 0.61 - 1.24 mg/dL Final  03/15/2019 11:15 PM 3.14 (H) 0.61 - 1.24 mg/dL Final  03/10/2019 05:10 AM 1.06 0.61 - 1.24 mg/dL Final  03/09/2019 06:17 PM 1.02 0.61 - 1.24 mg/dL Final  03/09/2019 11:31 AM 1.19 0.61 - 1.24 mg/dL Final  03/09/2019 03:39 AM 1.27 (H) 0.61 - 1.24 mg/dL Final  03/08/2019 08:03 PM 1.41 (H) 0.61 - 1.24 mg/dL Final  03/08/2019 01:10 PM 1.51 (H) 0.61 - 1.24 mg/dL Final  03/05/2019 11:28 AM 1.19 0.61 - 1.24 mg/dL Final  02/13/2019 03:03 PM 1.09 0.61 - 1.24 mg/dL Final  01/23/2019 12:52 PM 1.22 0.61 - 1.24 mg/dL Final  01/08/2019 08:55 AM 0.79 0.61 - 1.24 mg/dL Final  11/14/2018 09:49 AM 0.79 0.61 - 1.24 mg/dL Final  07/05/2018 12:31 PM 0.87 0.61 - 1.24 mg/dL Final   05/06/2018 04:57 AM 0.93 0.61 - 1.24 mg/dL Final  04/23/2018 05:08 AM 0.95 0.61 - 1.24 mg/dL Final  04/22/2018 02:20 PM 1.00 0.61 - 1.24 mg/dL Final    PMH:   Past Medical History:  Diagnosis Date   Anemia    Anxiety    Aortic stenosis    Arthritis    Asthma    Back pain    Cellulitis of skin with lymphangitis    CHF (congestive heart failure) (HCC)    Chronic diastolic congestive heart failure (HCC)    Chronic venous insufficiency    Constipation    COPD (chronic obstructive pulmonary disease) (HCC)    Coronary artery disease with history of myocardial infarction without history of CABG    Depression    Diabetes ([place])    Dyspnea    Essential hypertension    Gout    Heart murmur    Hypertension    Morbid obesity ([place])    Obesity    Pneumonia    walking pneumonia   Prostatitis    Pulmonary embolism ([place])    Pulmonary hypertension ([place])    Pyelonephritis    S/P aortic valve replacement with bioprosthetic valve 08/23/2017   25 mm Edwards Inspiris Resilia stented bovine pericardial tissue valve   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   24 mm Hemashield supracoronary straight graft    Sleep apnea    cpap    Thoracic ascending aortic aneurysm    Thoracic ascending aortic aneurysm    Tobacco abuse     PSH:   Past Surgical History:  Procedure Laterality Date   AORTIC VALVE REPLACEMENT N/A 08/23/2017   Procedure: AORTIC VALVE REPLACEMENT (AVR) USING INSPIRIS RESILIA AORTIC VALVE SIZE 25 MM;  Surgeon: Rexene Alberts, MD;  Location: [facility] OR;  Service: Open Heart Surgery;  Laterality: N/A;   CARDIAC VALVE REPLACEMENT N/A    Phreesia 02/07/2020   COLONOSCOPY WITH PROPOFOL N/A 11/14/2020   Procedure: COLONOSCOPY WITH PROPOFOL;  Surgeon: Gatha Mayer, MD;  Location: [facility] ENDOSCOPY;  Service: Endoscopy;  Laterality: N/A;   ESOPHAGOGASTRODUODENOSCOPY (EGD) WITH PROPOFOL N/A 11/14/2020   Procedure: ESOPHAGOGASTRODUODENOSCOPY (EGD) WITH PROPOFOL;  Surgeon: Gatha Mayer, MD;  Location: [place];  Service: Endoscopy;  Laterality: N/A;   GIVENS CAPSULE STUDY N/A 06/30/2021   Procedure: GIVENS CAPSULE STUDY;  Surgeon: Harvel Quale, MD;  Location: [facility] ENDO [facility];  Service: Gastroenterology;  Laterality: N/A;  7:30   MULTIPLE EXTRACTIONS WITH ALVEOLOPLASTY N/A 07/23/2017   Procedure: Extraction of tooth #10 with alveoloplasty and gross debridement of remaining teeth;  Surgeon: Lenn Cal, DDS;  Location: [place];  Service: Oral Surgery;  Laterality: N/A;   NASAL SEPTOPLASTY W/ TURBINOPLASTY Bilateral 07/01/2021   Procedure: NASAL SEPTOPLASTY WITH TURBINATE REDUCTION;  Surgeon: Leta Baptist, MD;  Location: [place];  Service: ENT;  Laterality: Bilateral;   PACEMAKER IMPLANT N/A 08/28/2017    St Jude Medical Assurity MRI conditional  dual-chamber pacemaker for symptomatic complete heart blockby Dr Rayann Heman   TEE WITHOUT CARDIOVERSION N/A 07/16/2017   Procedure: TRANSESOPHAGEAL ECHOCARDIOGRAM (TEE);  Surgeon: Lelon Perla, MD;  Location: [facility] ENDOSCOPY;  Service: Cardiovascular;  Laterality: N/A;   TEE WITHOUT CARDIOVERSION N/A 08/23/2017   Procedure: TRANSESOPHAGEAL ECHOCARDIOGRAM (TEE);  Surgeon: Rexene Alberts, MD;  Location: [place];  Service: Open Heart Surgery;  Laterality: N/A;   THORACIC AORTIC ANEURYSM REPAIR N/A 08/23/2017   Procedure: THORACIC ASCENDING ANEURYSM REPAIR (AAA) USING HEMASHIELD GOLD KNITTED MICROVEL DOUBLE VELOUR VASCULAR GRAFT D: 24 MM  L: 30 CM;  Surgeon: Rexene Alberts, MD;  Location: [place];  Service: Open Heart Surgery;  Laterality: N/A;    Allergies: No Known Allergies  Medications:   Prior to Admission medications   Medication Sig Start Date End Date Taking? Authorizing Provider  acetaminophen (TYLENOL) 325 MG tablet Take 650 mg by mouth every 6 (six) hours as needed for mild pain.    [provider]  albuterol (PROVENTIL) (2.5 MG/3ML) 0.083% nebulizer solution INHALE THE CONTENTS OF 1 VIAL VIA NEBULIZER EVERY  6 HOURS AS NEEDED FOR WHEEZING OR SHORTNESS OF BREATH 07/13/21   [name], MD  albuterol (VENTOLIN HFA) 108 (90 Base) MCG/ACT inhaler INHALE 2 PUFFS INTO THE LUNGS EVERY 6 (SIX) HOURS AS NEEDED FOR WHEEZING OR SHORTNESS OF BREATH. 06/01/21   [name], MD  allopurinol (ZYLOPRIM) 300 MG tablet TAKE 1 TABLET EVERY DAY 04/06/21   [name], MD  atorvastatin (LIPITOR) 40 MG tablet TAKE 1 TABLET EVERY DAY 02/14/21   [name], MD  Blood Glucose Monitoring Suppl (TRUE METRIX METER) w/Device KIT USE AS DIRECTED 08/19/20   [name], MD  buPROPion (WELLBUTRIN XL) 300 MG 24 hr tablet Take 1 tablet (300 mg total) by mouth daily. 02/17/21   [name], MD  Cholecalciferol 125 MCG (5000 UT) TABS Take 5,000 Units by mouth daily.    [provider]  docusate sodium (COLACE) 100 MG capsule Take 2 capsules (200 mg total) by mouth 2 (two) times daily. Patient taking differently: Take 200 mg by mouth daily. 11/15/20   Thurnell Lose, MD  empagliflozin (JARDIANCE) 10 MG TABS tablet Take 1 tablet (10 mg total) by mouth daily before breakfast. Patient not taking: Reported on 07/06/2021 01/12/21   Erma Heritage, PA-C  FEROSUL 325 (65 Fe) MG tablet TAKE 1 TABLET EVERY DAY 07/18/21   [name], MD  fexofenadine (ALLEGRA) 180 MG tablet Take 180 mg by mouth daily.    [provider]  gabapentin (NEURONTIN) 100 MG capsule TAKE 1 CAPSULE IN THE MORNING AND TAKE 2 CAPSULES IN THE EVENING 03/09/21   [name], MD  glipiZIDE (GLUCOTROL XL) 5 MG 24 hr tablet Take 1 tablet by mouth once daily with breakfast 06/28/21   [name], MD  hydrOXYzine (ATARAX/VISTARIL) 25 MG tablet Take 1 tablet (25 mg total) by mouth every 8 (eight) hours as needed. 02/03/21   [name], MD  Krill Oil 350 MG CAPS Take 350 mg by mouth daily.    [provider]  lisinopril (ZESTRIL) 20 MG tablet Take 1  tablet (20 mg total) by mouth daily. 05/13/21   [name], MD   metFORMIN (GLUCOPHAGE) 850 MG tablet TAKE 1 TABLET TWICE DAILY WITH MEALS 03/11/21   [name], MD  metolazone (ZAROXOLYN) 5 MG tablet TAKE 1 TABLET (5 MG TOTAL) BY MOUTH THREE TIMES A WEEK. Patient taking differently: Take 5 mg by mouth See admin instructions. TAKE 1 TABLET (5 MG TOTAL) BY MOUTH THREE TIMES A WEEK AS NEEDED 01/26/21   Allred, Jeneen Rinks, MD  metoprolol tartrate (LOPRESSOR) 100 MG tablet TAKE 1 TABLET TWICE DAILY 12/27/20   Josue Hector, MD  Misc. Devices MISC Sequential compression device - 1 pair. Apply them for 1 hour in the morning and 1 hour in the evening. 07/18/21   [name], MD  pantoprazole (PROTONIX) 40 MG tablet TAKE 1 TABLET EVERY DAY 05/05/21   [name], MD  potassium chloride SA (KLOR-CON) 20 MEQ tablet TAKE 3 TABS DAILY AND TAKE AN ADDITIONAL 1 TAB ON THE DAY YOU TAKE YOUR WEEKLY METOLAZONE DOSE 12/22/20   Ahmed Prima, Fransisco Hertz, PA-C  Semaglutide (RYBELSUS) 14 MG TABS Take 14 mg by mouth in the morning. 02/08/21   [name], MD  tamsulosin (FLOMAX) 0.4 MG CAPS capsule Take 1 capsule (0.4 mg total) by mouth daily. 04/05/21   Stoneking, Reece Leader., MD  Tiotropium Bromide Monohydrate (SPIRIVA RESPIMAT) 2.5 MCG/ACT AERS Inhale 2 puffs into the lungs daily. 02/03/21   [name], MD  torsemide (DEMADEX) 20 MG tablet Take 2 tablets (40 mg total) by mouth 2 (two) times daily. 02/04/21   Strader, Fransisco Hertz, PA-C  TRUE METRIX BLOOD GLUCOSE TEST test strip TEST ONE TIME DAILY FOR DIABETES 10/25/20   [name], MD  TRUEplus Lancets 33G MISC USE TO TEST ONE TIME DAILY FOR DIABETES 10/25/20   [name], MD  TURMERIC-GINGER PO Take 2 each by mouth daily.    [provider]    Inpatient medications:  allopurinol  300 mg Oral Daily   atorvastatin  40 mg Oral Daily   gabapentin  200 mg Oral QHS   insulin aspart  0-6 Units Subcutaneous TID WC   pantoprazole  40 mg Oral BID   potassium chloride  20 mEq Oral BID   tamsulosin  0.4 mg Oral Daily    umeclidinium bromide  1 puff Inhalation Daily    Discontinued Meds:   Medications Discontinued During This Encounter  Medication Reason   Tiotropium Bromide Monohydrate AERS 2 puff P&T Policy: Therapeutic Substitute   0.9 %  sodium chloride infusion    pantoprazole (PROTONIX) EC tablet 40 mg    potassium chloride SA (KLOR-CON M) CR tablet 20 mEq     Social History:  reports that he quit smoking about 3 years ago. His smoking use included cigarettes. He has a 32.00 pack-year smoking history. He has never used smokeless tobacco. He reports that he does not drink alcohol and does not use drugs.  Family History:   Family History  Problem Relation Age of Onset   Hypertension Mother    Alzheimer's disease Mother    Heart attack Mother    Heart attack Brother    Diabetes Brother     Pertinent items are noted in HPI. Weight change:   Intake/Output Summary (Last 24 hours) at 07/20/2021 1205 Last data filed at 07/20/2021 1147 Gross per 24 hour  Intake 1480 ml  Output 2300 ml  Net -820 ml   BP 98/66 (BP Location:  Right Wrist)    Pulse 71    Temp 97.9 F (36.6 C)    Resp 15    Ht '5\' 11"'  (1.803 m)    Wt (!) 223.8 kg    SpO2 100%    BMI 68.81 kg/m  Vitals:   07/20/21 0228 07/20/21 0613 07/20/21 0728 07/20/21 0909  BP: 112/80 98/66    Pulse: 72 73  71  Resp: 20 15    Temp: 98 F (36.7 C) 97.9 F (36.6 C)    TempSrc:      SpO2: 100% 99% 98% 100%  Weight:      Height:         General appearance: Morbidly obese, alert, cooperative, and no distress Head: Normocephalic, without obvious abnormality, atraumatic Resp: clear to auscultation bilaterally Cardio: faint HS, no rub GI: obese, +BS, soft, NT/ND Extremities: edema trace pretibial edema  Labs: Basic Metabolic Panel: Recent Labs  Lab 07/19/21 1602 07/19/21 1815 07/20/21 0540  NA 135  --  135  K 4.2  --  3.3*  CL 98  --  99  CO2 22  --  23  GLUCOSE 106*  --  139*  BUN 81*  --  74*  CREATININE 4.07*  --  2.75*   ALBUMIN  --  3.7  --   CALCIUM 8.6*  --  8.6*   Liver Function Tests: Recent Labs  Lab 07/19/21 1815  AST 36  ALT 19  ALKPHOS 71  BILITOT 0.7  PROT 8.2*  ALBUMIN 3.7   No results for input(s): LIPASE, AMYLASE in the last 168 hours. No results for input(s): AMMONIA in the last 168 hours. CBC: Recent Labs  Lab 07/19/21 1602 07/20/21 0540  WBC 11.7* 8.3  HGB 9.0* 8.3*  HCT 29.7* 28.1*  MCV 81.4 82.4  PLT 314 275   PT/INR: '@LABRCNTIP' (inr:5) Cardiac Enzymes: )No results for input(s): CKTOTAL, CKMB, CKMBINDEX, TROPONINI in the last 168 hours. CBG: Recent Labs  Lab 07/19/21 2329 07/20/21 0748 07/20/21 1147  GLUCAP 108* 119* 86    Iron Studies: No results for input(s): IRON, TIBC, TRANSFERRIN, FERRITIN in the last 168 hours.  Xrays/Other Studies: DG Chest 2 View  Result Date: 07/19/2021 CLINICAL DATA:  Chest pain with ambulation for 2 days, shortness of breath, headache, back pain EXAM: CHEST - 2 VIEW COMPARISON:  03/24/2021 FINDINGS: Frontal and lateral views of the chest demonstrate stable postsurgical changes from median sternotomy. 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 Mild enlargement the cardiac silhouette. Chronic central vascular prominence without superimposed airspace disease, effusion, or pneumothorax. No acute bony abnormalities. IMPRESSION: 1. Stable chest, no acute process. Electronically Signed   By: Randa Ngo M.D.   On: 07/19/2021 15:45   CT CHEST ABDOMEN PELVIS WO CONTRAST  Result Date: 07/19/2021 CLINICAL DATA:  Aortic aneurysm, known or suspected. Pt c/o chest pain, SOB and dyspnea upon exertion. Also c/o diarrhea and nausea EXAM: CT CHEST, ABDOMEN AND PELVIS WITHOUT CONTRAST TECHNIQUE: Multidetector CT imaging of the chest, abdomen and pelvis was performed following the standard protocol without IV contrast. RADIATION DOSE REDUCTION: This exam was performed according to the departmental dose-optimization program which includes automated exposure control,  adjustment of the mA and/or kV according to patient size and/or use of iterative reconstruction technique. COMPARISON:  CT renal 03/24/2021 FINDINGS: CT CHEST FINDINGS Cardiovascular: Normal heart size. No significant pericardial effusion. The thoracic aorta is normal in caliber. At least mild atherosclerotic plaque of the thoracic aorta. At least 2 vessel coronary artery calcifications. Status  post aortic valve replacement. Mediastinum/Nodes: No gross hilar adenopathy, noting limited sensitivity for the detection of hilar adenopathy on this noncontrast study. A borderline enlarged 1 cm right paratracheal lymph node (2:18). No enlarged or axillary lymph nodes. Thyroid gland, trachea, and esophagus demonstrate no significant findings. Lungs/Pleura: No focal consolidation. No pulmonary nodule. No pulmonary mass. No pleural effusion. No pneumothorax. Musculoskeletal: Left chest wall dual lead cardiac pacemaker in grossly appropriate position. Marked bilateral gynecomastia. No chest wall mass or suspicious bone lesions identified. CT ABDOMEN PELVIS FINDINGS Hepatobiliary: The liver is enlarged measuring up to 20 8 cm. No focal liver abnormality. Gallbladder is contracted. No gallstones, gallbladder wall thickening, or pericholecystic fluid. No biliary dilatation. Pancreas: No focal lesion. Normal pancreatic contour. No surrounding inflammatory changes. No main pancreatic ductal dilatation. Spleen: Enlarged measuring up to 15 cm. Evaluation for focal density. Adrenals/Urinary Tract: No adrenal nodule bilaterally. No nephrolithiasis and no hydronephrosis. No definite contour-deforming renal mass. No ureterolithiasis or hydroureter. The urinary bladder is unremarkable. Stomach/Bowel: Stomach is within normal limits. No evidence of bowel wall thickening or dilatation. Appendix appears normal. Vascular/Lymphatic: No abdominal aorta or iliac aneurysm. Mild atherosclerotic plaque of the aorta and its branches. No abdominal,  pelvic, or inguinal lymphadenopathy. Reproductive: Prostate is unremarkable. Other: No intraperitoneal free fluid. No intraperitoneal free gas. No organized fluid collection. Musculoskeletal: No abdominal wall hernia or abnormality. No suspicious lytic or blastic osseous lesions. No acute displaced fracture. Multilevel degenerative changes of the spine with intervertebral disc space vacuum phenomenon the L2-L3 and L3-L4 levels. IMPRESSION: 1. Hepatosplenomegaly. Markedly limited evaluation on this noncontrast study. 2. Nonspecific borderline enlarged right paratracheal lymph node (1 cm). 3. Aortic Atherosclerosis (ICD10-I70.0). Status post aortic valve repair. 4. Marked bilateral gynecomastia. Electronically Signed   By: Iven Finn M.D.   On: 07/19/2021 19:07     Assessment/Plan:  AKI/CKD stage IIIa - presumably ischemic ATN in setting of hypotension, likely hypovolemia, and concomitant ARB and SGLT2-inhibitor.  Scr has markedly improved overnight with IVF's.  CT scan without evidence of hydronephrosis and urine has bland sediment.   Continue to hold lisinopril, jardiance, and metformin Renal dose meds Avoid nephrotoxic agents (ie. IV contrast, NSAIDs, phosphate containing bowel preps) No indication for dialysis as his renal function has markedly improved overnight. Decrease IVF's and follow.  Hypotension - improved with IVF"s. ECHO pending Chest pain - cardiology following.  P. Atrial fib - per Cardiology.  Xarelto on hold due to GIB and AKI Heme + stools - GI consulted. Anemia - h/o chronic blood loss anemia as above.  Hgb stable and transfuse prn.  Chronic diastolic chf - decrease IVF's as he now has some edema COPD/OSA - per primary DM type 2 - stable Prolonged QT interval - on tele Lactic acidosis - rising despite improved BP and IVF's.  Procalcitonin normal.  Possibly due to metformin and AKI.  Continue to follow.    Governor Rooks Nahdia Doucet 07/20/2021, 12:05 PM

## 2021-07-20 NOTE — Progress Notes (Signed)
Came in to place patient on CPAP machine.  Patient was already on.  Explained that I had to document it and I would leave him be; patient made the comment, "famous last words in hospital".  I checked patient's sat and HR, sat was at 80%.  Someone had hooked up O2 through the bubble humidifier and ran it into machine.  I was going to take it off humidification and place it on with a straight connection to flow meter and patient protested.  He stated that someone had hooked it up that way to give him humidification.  I tried to explain that it couldn't be hooked up that way, and the patient needed some O2 with a sat of 80%.  Patient became agitated and pulled off CPAP machine and stated to just forget about it.  I asked if there was someone at home that could bring his machine, and he stated that he lived alone and made statement, "ask doctor to release me and then I can go home and sleep on my machine."  I left the room after that due to patient being so irrate and refusing services. ?

## 2021-07-20 NOTE — Progress Notes (Signed)
PROGRESS NOTE    Patient: Reginald [name]                            PCP: [name], MD                    DOB: 1970/10/31            DOA: 07/19/2021 PIR:[id number]             DOS: 07/20/2021, 3:27 PM   LOS: 1 day   Date of Service: The patient was seen and examined on 07/20/2021  Subjective:   The patient was seen and examined this morning. Awake alert oriented blood pressure still soft 90/54, Not complaining of any chest pain, reporting of lower extremity edema Otherwise no issues overnight .  Brief Narrative:   Per HPI: SYNCERE EBLE is a pleasant 51 y.o. male with medical history significant for BMI 47, PAF on Xarelto, history of PE, history of severe aortic stenosis and ascending aortic aneurysm status post aneurysm repair and bioprosthetic AVR, renal insufficiency, HFpEF, COPD, OSA on CPAP, type 2 diabetes mellitus, and chronic blood loss anemia, presenting to the emergency department for evaluation of chest pain and fatigue.  Patient reports that he had been lifting something heavy a couple days ago when he developed pain in the central chest.  Initially, pain resolved after 30 minutes but has returned when he is active and when he moves his arms.  He reports increased exertional dyspnea and fatigue as well but denies any cough, wheezing, fever, or chills.  He has had several watery stools today but not yesterday.  He denies abdominal pain, nausea, or vomiting.  He reports occasional dark stool but none in the past couple days.  States that he is scheduled to see hematology on 07/29/2021 for his anemia and scheduled to see gastroenterology at [facility] on April 14 for the same reason.   ED Course:  Upon arrival to the ED, afebrile, hypotension with systolic blood pressure as low as 73.  EKG features sinus rhythm with first-degree AV nodal block, RBBB, and prolonged QT interval.  Chest x-ray negative for acute cardiopulmonary disease.  CT of the chest/abdomen/pelvis without contrast is  negative for hydronephrosis or acute intrathoracic finding but notable for hepatomegaly.  Labs:  BUN of 81 and creatinine 4.07.  CBC mild leukocytosis and hemoglobin 9.0.  Lactic acid went from 3.0-2.1.   Troponin was 19 and then 18.   D-dimer was normal.  ED PA reports that stool was grossly normal but FOBT positive.  The PA discussed the case with cardiology and GI, treated the patient with 325 mg of aspirin, IV PPI, and 1 L of saline  Assessment & Plan:   Principal Problem:   Acute renal failure superimposed on stage 2 chronic kidney disease ([place]) Active Problems:   Paroxysmal atrial fibrillation (HCC)   Hypotension due to hypovolemia   Chronic heart failure with preserved ejection fraction (HFpEF) (HCC)   COPD (chronic obstructive pulmonary disease) ([place])   History of pulmonary embolism   OSA on CPAP   DM2 (diabetes mellitus, type 2) (HCC)   Chronic blood loss anemia   Prolonged QT interval   Morbid obesity with BMI of 60.0-69.9, adult (HCC)   Chest pain   AKI superimposed on CKD IIIa - SCr 4.07 on admission,>>> 2.75 this morning - (Cr 1.20 two weeks ago)  - No hydro on CT, hypotensive  in ED and likely prerenal  -Likely due to dehydration, hypotension -nephrotoxic lisinopril, Jardiance, metformin ---given a liter of NS in ED  - Hold diuretics and antihypertensives, check FEUrea, continue gentle IVF hydration, renally-dose medications, repeat chem panel     Hypotension - SBP as low as 70s in ED, improved to 90s after a liter of IVF -Blood pressure still soft 90/54 - No fever or apparent infection, no overt bleeding, d-dimer low - Responding to IVF, hypovolemia seems most likely  - Hold diuretics and antihypertensives, continue IVF, culture if febrile, monitor blood counts     Chest pain  - Presents with 2 days of CP that started after heavy lifting, is reproducible, and most likely MSK  - Troponin was 19 then 18 in ED, d-dimer low, no acute EKG changes noted, and no acute  findings on chest CT   -Acute ACS was ruled out   PAF  - In SR in ED  - Hold Xarelto initially in light of low GFR and acute on chronic anemia with occult GIB    HFpEF    - He is hypotensive in ED, difficult to determine volume status d/t body habitus but suspected to be hypovolemic  - Hold diuretics, monitor wt and I/Os, hold ACE and beta-blocker in light of hypotension and AKI   -TTE normal aortic root, normal radial   Anemia of chronic disease -?  Chronic GI bleed - Hgb is 9.0, continuing slow downtrend >>> 8.3 - ED PA reports grossly normal stool that was FOBT+  - He had EGD and colonoscopy in June 2022 with hemorrhoids but no other potential etiology identified, underwent more recent capsule study and unsure of results but reports consultation with [facility] scheduled for April  - Hold Xarelto initially while trending   -We have asked for GI input     COPD; OSA  - No cough or wheezing on admission  - Continue Spiriva, CPAP qHS   -Stable on room air   Type II DM  - A1c was 5.7% in Feb 2023  -Checking CBG q. ACH S, SSI coverage   Prolonged QT  - Continue cardiac monitoring, check magnesium, avoid QT-prolonging medications    Hx of PE  - D-dimer normal in ED  - Xarelto held initially given low GFR and  -Stable    Morbid obesity Body mass index is 68.81 kg/m. -Discussed with patient regarding healthy lifestyle, exercise, weight loss program, and close follow-up with PCP   ------------------------------------------------------------------------------------------------------------------------------------------------  DVT prophylaxis:  SCDs Start: 07/19/21 2132   Code Status:   Code Status: Full Code  Family Communication: No family member present at bedside- attempt will be made to update daily The above findings and plan of care has been discussed with patient (and family)  in detail,  they expressed understanding and agreement of above. -Advance care planning  has been discussed.   Admission status:   Status is: Inpatient Remains inpatient appropriate because: Progressive CKD, hypertension, chest pain    Procedures:   No admission procedures for hospital encounter.   Antimicrobials:  Anti-infectives (From admission, onward)    None        Medication:   allopurinol  300 mg Oral Daily   atorvastatin  40 mg Oral Daily   gabapentin  200 mg Oral QHS   insulin aspart  0-6 Units Subcutaneous TID WC   pantoprazole  40 mg Oral BID   potassium chloride  20 mEq Oral BID   tamsulosin  0.4 mg  Oral Daily   umeclidinium bromide  1 puff Inhalation Daily    acetaminophen **OR** acetaminophen, albuterol, hydrOXYzine   Objective:   Vitals:   07/20/21 0613 07/20/21 0728 07/20/21 0909 07/20/21 1440  BP: 98/66   (!) 90/54  Pulse: 73  71 75  Resp: 15   (!) 22  Temp: 97.9 F (36.6 C)   98 F (36.7 C)  TempSrc:    Oral  SpO2: 99% 98% 100% 100%  Weight:      Height:        Intake/Output Summary (Last 24 hours) at 07/20/2021 1527 Last data filed at 07/20/2021 1147 Gross per 24 hour  Intake 1480 ml  Output 2300 ml  Net -820 ml   Filed Weights   07/20/21 0228  Weight: (!) 223.8 kg     Examination:   Physical Exam  Constitution: Morbidly obese male Alert, cooperative, no distress,  Appears calm and comfortable  Psychiatric:   Normal and stable mood and affect, cognition intact,   HEENT:        Normocephalic, PERRL, otherwise with in Normal limits  Chest:         Chest symmetric Cardio vascular:  S1/S2, RRR, No murmure, No Rubs or Gallops  pulmonary: Clear to auscultation bilaterally, respirations unlabored, negative wheezes / crackles Abdomen: Soft, non-tender, non-distended, bowel sounds,no masses, no organomegaly Muscular skeletal: Limited exam - in bed, able to move all 4 extremities,   Neuro: CNII-XII intact. , normal motor and sensation, reflexes intact  Extremities: +2  pitting edema lower extremities, +2 pulses  Skin:  Dry, warm to touch, negative for any Rashes, No open wounds Wounds: per nursing documentation   ------------------------------------------------------------------------------------------------------------------------------------------    LABs:  CBC Latest Ref Rng & Units 07/20/2021 07/19/2021 07/05/2021  WBC 4.0 - 10.5 K/uL 8.3 11.7(H) 10.0  Hemoglobin 13.0 - 17.0 g/dL 8.3(L) 9.0(L) 10.2(L)  Hematocrit 39.0 - 52.0 % 28.1(L) 29.7(L) 32.2(L)  Platelets 150 - 400 K/uL 275 314 262   CMP Latest Ref Rng & Units 07/20/2021 07/19/2021 07/05/2021  Glucose 70 - 99 mg/dL 139(H) 106(H) 82  BUN 6 - 20 mg/dL 74(H) 81(H) 19  Creatinine 0.61 - 1.24 mg/dL 2.75(H) 4.07(H) 1.20  Sodium 135 - 145 mmol/L 135 135 143  Potassium 3.5 - 5.1 mmol/L 3.3(L) 4.2 4.4  Chloride 98 - 111 mmol/L 99 98 100  CO2 22 - 32 mmol/L 23 22 26   Calcium 8.9 - 10.3 mg/dL 8.6(L) 8.6(L) 9.2  Total Protein 6.5 - 8.1 g/dL - 8.2(H) 7.2  Total Bilirubin 0.3 - 1.2 mg/dL - 0.7 1.0  Alkaline Phos 38 - 126 U/L - 71 90  AST 15 - 41 U/L - 36 43(H)  ALT 0 - 44 U/L - 19 20       Micro Results Recent Results (from the past 240 hour(s))  Resp Panel by RT-PCR (Flu A&B, Covid) Nasopharyngeal Swab     Status: None   Collection Time: 07/19/21  5:27 PM   Specimen: Nasopharyngeal Swab; Nasopharyngeal(NP) swabs in vial transport medium  Result Value Ref Range Status   SARS Coronavirus 2 by RT PCR NEGATIVE NEGATIVE Final    Comment: (NOTE) SARS-CoV-2 target nucleic acids are NOT DETECTED.  The SARS-CoV-2 RNA is generally detectable in upper respiratory specimens during the acute phase of infection. The lowest concentration of SARS-CoV-2 viral copies this assay can detect is 138 copies/mL. A negative result does not preclude SARS-Cov-2 infection and should not be used as the sole basis for treatment or  other patient management decisions. A negative result may occur with  improper specimen collection/handling, submission of specimen other than  nasopharyngeal swab, presence of viral mutation(s) within the areas targeted by this assay, and inadequate number of viral copies(<138 copies/mL). A negative result must be combined with clinical observations, patient history, and epidemiological information. The expected result is Negative.  Fact Sheet for Patients:  EntrepreneurPulse.com.au  Fact Sheet for Healthcare Providers:  IncredibleEmployment.be  This test is no t yet approved or cleared by the [place] FDA and  has been authorized for detection and/or diagnosis of SARS-CoV-2 by FDA under an Emergency Use Authorization (EUA). This EUA will remain  in effect (meaning this test can be used) for the duration of the COVID-19 declaration under Section 564(b)(1) of the Act, 21 U.S.C.section 360bbb-3(b)(1), unless the authorization is terminated  or revoked sooner.       Influenza A by PCR NEGATIVE NEGATIVE Final   Influenza B by PCR NEGATIVE NEGATIVE Final    Comment: (NOTE) The Xpert Xpress SARS-CoV-2/FLU/RSV plus assay is intended as an aid in the diagnosis of influenza from Nasopharyngeal swab specimens and should not be used as a sole basis for treatment. Nasal washings and aspirates are unacceptable for Xpert Xpress SARS-CoV-2/FLU/RSV testing.  Fact Sheet for Patients: EntrepreneurPulse.com.au  Fact Sheet for Healthcare Providers: IncredibleEmployment.be  This test is not yet approved or cleared by the [place] FDA and has been authorized for detection and/or diagnosis of SARS-CoV-2 by FDA under an Emergency Use Authorization (EUA). This EUA will remain in effect (meaning this test can be used) for the duration of the COVID-19 declaration under Section 564(b)(1) of the Act, 21 U.S.C. section 360bbb-3(b)(1), unless the authorization is terminated or revoked.  Performed at [facility], [address], [place], [place] [address]   Blood  culture (routine x 2)     Status: None (Preliminary result)   Collection Time: 07/19/21  6:15 PM   Specimen: BLOOD LEFT HAND  Result Value Ref Range Status   Specimen Description BLOOD LEFT HAND  Final   Special Requests   Final    BOTTLES DRAWN AEROBIC AND ANAEROBIC Blood Culture adequate volume   Culture   Final    NO GROWTH < 24 HOURS Performed at [facility], [address], [place], [place] [address]    Report Status PENDING  Incomplete  Blood culture (routine x 2)     Status: None (Preliminary result)   Collection Time: 07/19/21  6:15 PM   Specimen: Right Antecubital; Blood  Result Value Ref Range Status   Specimen Description RIGHT ANTECUBITAL  Final   Special Requests   Final    BOTTLES DRAWN AEROBIC AND ANAEROBIC Blood Culture results may not be optimal due to an excessive volume of blood received in culture bottles   Culture   Final    NO GROWTH < 24 HOURS Performed at [facility], [address], [place], [place] [address]    Report Status PENDING  Incomplete    Radiology Reports DG Chest 2 View  Result Date: 07/19/2021 CLINICAL DATA:  Chest pain with ambulation for 2 days, shortness of breath, headache, back pain EXAM: CHEST - 2 VIEW COMPARISON:  03/24/2021 FINDINGS: Frontal and lateral views of the chest demonstrate stable postsurgical changes from median sternotomy. 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 Mild enlargement the cardiac silhouette. Chronic central vascular prominence without superimposed airspace disease, effusion, or pneumothorax. No acute bony abnormalities. IMPRESSION: 1. Stable chest, no acute process. Electronically Signed  By: Randa Ngo M.D.   On: 07/19/2021 15:45   ECHOCARDIOGRAM COMPLETE  Result Date: 07/20/2021    ECHOCARDIOGRAM REPORT   Patient Name:   Reginald [name] Date of Exam: 07/20/2021 Medical Rec #:  [id number]    Height:       71.0 in Accession #:    [id number]   Weight:       493.4 lb Date of Birth:  [birth year]   BSA:          3.095 m Patient  Age:    50 years     BP:           98/66 mmHg Patient Gender: M            HR:           74 bpm. Exam Location:  [facility] Procedure: 2D Echo, Cardiac Doppler, Color Doppler and Intracardiac            Opacification Agent Indications:    Chest Pain  History:        Patient has prior history of Echocardiogram examinations, most                 recent 07/28/2020. CHF, Pacemaker, COPD, Arrythmias:Atrial                 Fibrillation, Signs/Symptoms:Shortness of Breath and Chest Pain;                 Risk Factors:Hypertension, Diabetes, Dyslipidemia and Former                 Smoker. Aortic Valve replacement with 22mm Edwards Inspiris                 stented Bovine. S/P Ascending Aortic replacement.  Sonographer:    Wenda Low Referring Phys: [id number] Alphonse Guild BRANCH  Sonographer Comments: Technically difficult study due to poor echo windows and patient is morbidly obese. IMPRESSIONS  1. Left ventricular ejection fraction, by estimation, is 60 to 65%. The left ventricle has normal function. Left ventricular endocardial border not optimally defined to evaluate regional wall motion. There is moderate concentric left ventricular hypertrophy. Left ventricular diastolic parameters are consistent with Grade II diastolic dysfunction (pseudonormalization). Elevated left atrial pressure.  2. Right ventricular systolic function is normal. The right ventricular size is mildly enlarged. Tricuspid regurgitation signal is inadequate for assessing PA pressure.  3. Right atrial size was mildly dilated.  4. The mitral valve was not well visualized. No evidence of mitral valve regurgitation.  5. The aortic valve has been replaced by a 25 mm Edwards valve Aortic valve regurgitation is not visualized. Mean gradient 18 mm Hg. Only one Doppler tracing of AS showed increased acceleration time. 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  6. Aortic root/ascending aorta has been repaired/replaced. Comparison(s): RV function has improved. Stable prosthetic aortic  parameters. FINDINGS  Left Ventricle: Left ventricular ejection fraction, by estimation, is 60 to 65%. The left ventricle has normal function. Left ventricular endocardial border not optimally defined to evaluate regional wall motion. Definity contrast agent was given IV to delineate the left ventricular endocardial borders. The left ventricular internal cavity size was normal in size. There is moderate concentric left ventricular hypertrophy. Left ventricular diastolic parameters are consistent with Grade II diastolic dysfunction (pseudonormalization). Elevated left atrial pressure. Right Ventricle: The right ventricular size is mildly enlarged. No increase in right ventricular wall thickness. Right ventricular systolic function is normal. Tricuspid regurgitation signal is inadequate for assessing PA pressure. Left Atrium:  Left atrial size was normal in size. Right Atrium: Right atrial size was mildly dilated. Pericardium: Trivial pericardial effusion is present. Mitral Valve: MVA 1.73 cms by VTI. The mitral valve was not well visualized. No evidence of mitral valve regurgitation. MV peak gradient, 10.9 mmHg. The mean mitral valve gradient is 3.0 mmHg with average heart rate of 74 bpm. Tricuspid Valve: The tricuspid valve is not well visualized. Tricuspid valve regurgitation is not demonstrated. No evidence of tricuspid stenosis. Aortic Valve: The aortic valve has been repaired/replaced. Aortic valve regurgitation is not visualized. Aortic valve mean gradient measures 18.0 mmHg. Aortic valve peak gradient measures 35.9 mmHg. Aortic valve area, by VTI measures 0.99 cm. Pulmonic Valve: The pulmonic valve was not well visualized. Pulmonic valve regurgitation is not visualized. No evidence of pulmonic stenosis. Aorta: The aortic root and ascending aorta are structurally normal, with no evidence of dilitation and the aortic root/ascending aorta has been repaired/replaced. IAS/Shunts: No atrial level shunt detected by  color flow Doppler. Additional Comments: A device lead is visualized.  LEFT VENTRICLE PLAX 2D LVIDd:         5.90 cm   Diastology LVIDs:         3.50 cm   LV e' medial:    7.62 cm/s LV PW:         1.30 cm   LV E/e' medial:  15.0 LV IVS:        1.40 cm   LV e' lateral:   8.05 cm/s LVOT diam:     2.00 cm   LV E/e' lateral: 14.2 LV SV:         70 LV SV Index:   23 LVOT Area:     3.14 cm  RIGHT VENTRICLE RV Basal diam:  4.40 cm RV Mid diam:    3.90 cm LEFT ATRIUM           Index        RIGHT ATRIUM           Index LA diam:      4.80 cm 1.55 cm/m   RA Area:     28.10 cm LA Vol (A4C): 73.3 ml 23.68 ml/m  RA Volume:   106.00 ml 34.25 ml/m  AORTIC VALVE AV Area (Vmax):    1.09 cm AV Area (Vmean):   1.19 cm AV Area (VTI):     0.99 cm AV Vmax:           299.50 cm/s AV Vmean:          182.000 cm/s AV VTI:            0.708 m AV Peak Grad:      35.9 mmHg AV Mean Grad:      18.0 mmHg LVOT Vmax:         104.00 cm/s LVOT Vmean:        69.100 cm/s LVOT VTI:          0.223 m LVOT/AV VTI ratio: 0.31  AORTA Ao Root diam: 3.20 cm Ao Asc diam:  3.80 cm MITRAL VALVE MV Area (PHT): 3.76 cm     SHUNTS MV Area VTI:   1.74 cm     Systemic VTI:  0.22 m MV Peak grad:  10.9 mmHg    Systemic Diam: 2.00 cm MV Mean grad:  3.0 mmHg MV Vmax:       1.65 m/s MV Vmean:      79.6 cm/s MV Decel Time: 202 msec MV E velocity: 114.00  cm/s MV A velocity: 73.30 cm/s MV E/A ratio:  1.56 Rudean Haskell MD Electronically signed by Rudean Haskell MD Signature Date/Time: 07/20/2021/2:59:57 PM    Final    CT CHEST ABDOMEN PELVIS WO CONTRAST  Result Date: 07/19/2021 CLINICAL DATA:  Aortic aneurysm, known or suspected. Pt c/o chest pain, SOB and dyspnea upon exertion. Also c/o diarrhea and nausea EXAM: CT CHEST, ABDOMEN AND PELVIS WITHOUT CONTRAST TECHNIQUE: Multidetector CT imaging of the chest, abdomen and pelvis was performed following the standard protocol without IV contrast. RADIATION DOSE REDUCTION: This exam was performed according to  the departmental dose-optimization program which includes automated exposure control, adjustment of the mA and/or kV according to patient size and/or use of iterative reconstruction technique. COMPARISON:  CT renal 03/24/2021 FINDINGS: CT CHEST FINDINGS Cardiovascular: Normal heart size. No significant pericardial effusion. The thoracic aorta is normal in caliber. At least mild atherosclerotic plaque of the thoracic aorta. At least 2 vessel coronary artery calcifications. Status post aortic valve replacement. Mediastinum/Nodes: No gross hilar adenopathy, noting limited sensitivity for the detection of hilar adenopathy on this noncontrast study. A borderline enlarged 1 cm right paratracheal lymph node (2:18). No enlarged or axillary lymph nodes. Thyroid gland, trachea, and esophagus demonstrate no significant findings. Lungs/Pleura: No focal consolidation. No pulmonary nodule. No pulmonary mass. No pleural effusion. No pneumothorax. Musculoskeletal: Left chest wall dual lead cardiac pacemaker in grossly appropriate position. Marked bilateral gynecomastia. No chest wall mass or suspicious bone lesions identified. CT ABDOMEN PELVIS FINDINGS Hepatobiliary: The liver is enlarged measuring up to 20 8 cm. No focal liver abnormality. Gallbladder is contracted. No gallstones, gallbladder wall thickening, or pericholecystic fluid. No biliary dilatation. Pancreas: No focal lesion. Normal pancreatic contour. No surrounding inflammatory changes. No main pancreatic ductal dilatation. Spleen: Enlarged measuring up to 15 cm. Evaluation for focal density. Adrenals/Urinary Tract: No adrenal nodule bilaterally. No nephrolithiasis and no hydronephrosis. No definite contour-deforming renal mass. No ureterolithiasis or hydroureter. The urinary bladder is unremarkable. Stomach/Bowel: Stomach is within normal limits. No evidence of bowel wall thickening or dilatation. Appendix appears normal. Vascular/Lymphatic: No abdominal aorta or iliac  aneurysm. Mild atherosclerotic plaque of the aorta and its branches. No abdominal, pelvic, or inguinal lymphadenopathy. Reproductive: Prostate is unremarkable. Other: No intraperitoneal free fluid. No intraperitoneal free gas. No organized fluid collection. Musculoskeletal: No abdominal wall hernia or abnormality. No suspicious lytic or blastic osseous lesions. No acute displaced fracture. Multilevel degenerative changes of the spine with intervertebral disc space vacuum phenomenon the L2-L3 and L3-L4 levels. IMPRESSION: 1. Hepatosplenomegaly. Markedly limited evaluation on this noncontrast study. 2. Nonspecific borderline enlarged right paratracheal lymph node (1 cm). 3. Aortic Atherosclerosis (ICD10-I70.0). Status post aortic valve repair. 4. Marked bilateral gynecomastia. Electronically Signed   By: Iven Finn M.D.   On: 07/19/2021 19:07    SIGNED: Deatra James, MD, FHM. [facility],  Pager (please use [email or web address] to page/text) Please use Epic Secure Chat for non-urgent communication (7AM-7PM)  If 7PM-7AM, please contact night-coverage [email or web address], 07/20/2021, 3:27 PM

## 2021-07-20 NOTE — Progress Notes (Signed)
?   07/20/21 1440  ?Assess: MEWS Score  ?Temp 98 ?F (36.7 ?C)  ?BP (!) 90/54  ?Pulse Rate 75  ?Resp (!) 22  ?SpO2 100 %  ?O2 Device Room Air  ?Assess: MEWS Score  ?MEWS Temp 0  ?MEWS Systolic 1  ?MEWS Pulse 0  ?MEWS RR 1  ?MEWS LOC 0  ?MEWS Score 2  ?MEWS Score Color Yellow  ?Assess: if the MEWS score is Yellow or Red  ?Were vital signs taken at a resting state? Yes  ?Focused Assessment No change from prior assessment  ?Reginald [name] Detection of Sepsis Score *See Row Information* Low  ?MEWS guidelines implemented *See Row Information* No, vital signs rechecked  ?Treat  ?Pain Scale 0-10  ?Pain Score 2  ?Take Vital Signs  ?Increase Vital Sign Frequency  Yellow: Q 2hr X 2 then Q 4hr X 2, if remains yellow, continue Q 4hrs  ?Notify: Provider  ?Provider Name/Title MD Shahmehdi  ?Date Provider Notified 07/20/21  ?Time Provider Notified [phone]  ?Provider response Other (Comment) ?(awaiting provider response)  ? ? ?

## 2021-07-20 NOTE — Consult Note (Signed)
Cardiology Consultation:   Patient ID: Reginald [name] MRN: [id number]; DOB: [birth year]  Admit date: 07/19/2021 Date of Consult: 07/20/2021  PCP:  [name], MD   [facility] Providers Cardiologist:  Jenkins Rouge, MD  Electrophysiologist:  Thompson Grayer, MD       Patient Profile:   Reginald [name] is a 51 y.o. male with a hx of severe AS and aortic aneurysm s/p tissue AVR and aneurysm repair, severe obesity, PAF, complete heart block with pacemaker  who is being seen 07/20/2021 for the evaluation of chest pain and fatigue at the request of Dr Roger Shelter.  History of Present Illness:   Reginald [name] 51 yo male history of severe AS with bovine AVR 08/2017 and repair of ascending thoracic aortic aneurysm, normal coronaries by cath in 9417, chronic diastolic HF, PAF, complete heart block with St Jude pacemaker, history of PE, HTN, morbid obesity, OSA presents with chest pain and fatigue  Reports sudden onset of left sided chest pain while doing heavy lifting. SOme associated SOB. Was not positional but was reproducible to palpation on presentation. Pain lasted about 20 minutes then resolved. Recurrent episode the following day while doing more lifiting. +fatigue, SOB/doe  K 4.2 BUN 81 Cr 4.07 WBC 11.7 Hgb 9 Plt 314 Lactic acid 3-->2-->5  Ddimer 0.31 Trop 19-->18 COVID neg flu neg CXR no acute process CT C/A/P no contrast: no specific findings EKG SR, RBBB LPFB   Past Medical History:  Diagnosis Date   Anemia    Anxiety    Aortic stenosis    Arthritis    Asthma    Back pain    Cellulitis of skin with lymphangitis    CHF (congestive heart failure) (HCC)    Chronic diastolic congestive heart failure (HCC)    Chronic venous insufficiency    Constipation    COPD (chronic obstructive pulmonary disease) (HCC)    Coronary artery disease with history of myocardial infarction without history of CABG    Depression    Diabetes (HCC)    Dyspnea    Essential hypertension    Gout    Heart  murmur    Hypertension    Morbid obesity ([place])    Obesity    Pneumonia    walking pneumonia   Prostatitis    Pulmonary embolism ([place])    Pulmonary hypertension (HCC)    Pyelonephritis    S/P aortic valve replacement with bioprosthetic valve 08/23/2017   25 mm Edwards Inspiris Resilia stented bovine pericardial tissue valve   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   24 mm Hemashield supracoronary straight graft    Sleep apnea    cpap    Thoracic ascending aortic aneurysm    Thoracic ascending aortic aneurysm    Tobacco abuse     Past Surgical History:  Procedure Laterality Date   AORTIC VALVE REPLACEMENT N/A 08/23/2017   Procedure: AORTIC VALVE REPLACEMENT (AVR) USING INSPIRIS RESILIA AORTIC VALVE SIZE 25 MM;  Surgeon: Rexene Alberts, MD;  Location: [place];  Service: Open Heart Surgery;  Laterality: N/A;   CARDIAC VALVE REPLACEMENT N/A    Phreesia 02/07/2020   COLONOSCOPY WITH PROPOFOL N/A 11/14/2020   Procedure: COLONOSCOPY WITH PROPOFOL;  Surgeon: Gatha Mayer, MD;  Location[facility] ENDOSCOPY;  Service: Endoscopy;  Laterality: N/A;   ESOPHAGOGASTRODUODENOSCOPY (EGD) WITH PROPOFOL N/A 11/14/2020   Procedure: ESOPHAGOGASTRODUODENOSCOPY (EGD) WITH PROPOFOL;  Surgeon: Gatha Mayer, MD;  Location: [place];  Service: Endoscopy;  Laterality: N/A;   GIVENS CAPSULE  STUDY N/A 06/30/2021   Procedure: GIVENS CAPSULE STUDY;  Surgeon: Harvel Quale, MD;  [facility];  Service: Gastroenterology;  Laterality: N/A;  7:30   MULTIPLE EXTRACTIONS WITH ALVEOLOPLASTY N/A 07/23/2017   Procedure: Extraction of tooth #10 with alveoloplasty and gross debridement of remaining teeth;  Surgeon: Lenn Cal, DDS;  Location: [place];  Service: Oral Surgery;  Laterality: N/A;   NASAL SEPTOPLASTY W/ TURBINOPLASTY Bilateral 07/01/2021   Procedure: NASAL SEPTOPLASTY WITH TURBINATE REDUCTION;  Surgeon: Leta Baptist, MD;  Location: [place];  Service: ENT;  Laterality: Bilateral;    PACEMAKER IMPLANT N/A 08/28/2017    St Jude Medical Assurity MRI conditional  dual-chamber pacemaker for symptomatic complete heart blockby Dr Rayann Heman   TEE WITHOUT CARDIOVERSION N/A 07/16/2017   Procedure: TRANSESOPHAGEAL ECHOCARDIOGRAM (TEE);  Surgeon: Lelon Perla, MD;  Location: [facility] ENDOSCOPY;  Service: Cardiovascular;  Laterality: N/A;   TEE WITHOUT CARDIOVERSION N/A 08/23/2017   Procedure: TRANSESOPHAGEAL ECHOCARDIOGRAM (TEE);  Surgeon: Rexene Alberts, MD;  Location: [place];  Service: Open Heart Surgery;  Laterality: N/A;   THORACIC AORTIC ANEURYSM REPAIR N/A 08/23/2017   Procedure: THORACIC ASCENDING ANEURYSM REPAIR (AAA) USING HEMASHIELD GOLD KNITTED MICROVEL DOUBLE VELOUR VASCULAR GRAFT D: 24 MM  L: 30 CM;  Surgeon: Rexene Alberts, MD;  Location: [place];  Service: Open Heart Surgery;  Laterality: N/A;      Inpatient Medications: Scheduled Meds:  allopurinol  300 mg Oral Daily   atorvastatin  40 mg Oral Daily   gabapentin  200 mg Oral QHS   insulin aspart  0-6 Units Subcutaneous TID WC   pantoprazole  40 mg Oral BID   potassium chloride  20 mEq Oral BID   tamsulosin  0.4 mg Oral Daily   umeclidinium bromide  1 puff Inhalation Daily   Continuous Infusions:  lactated ringers     lactated ringers     PRN Meds: acetaminophen **OR** acetaminophen, albuterol, hydrOXYzine  Allergies:   No Known Allergies  Social History:   Social History   Socioeconomic History   Marital status: Divorced    Spouse name: Not on file   Number of children: Not on file   Years of education: Not on file   Highest education level: Not on file  Occupational History   Not on file  Tobacco Use   Smoking status: Former    Packs/day: 2.00    Years: 16.00    Pack years: 32.00    Types: Cigarettes    Quit date: 08/23/2017    Years since quitting: 3.9   Smokeless tobacco: Never  Vaping Use   Vaping Use: Never used  Substance and Sexual Activity   Alcohol use: No    Comment: have had  alcohol in the past, not heavy   Drug use: No   Sexual activity: Not Currently    Partners: Female  Other Topics Concern   Not on file  Social History Narrative   Not on file   Social Determinants of Health   Financial Resource Strain: Low Risk    Difficulty of Paying Living Expenses: Not hard at all  Food Insecurity: No Food Insecurity   Worried About Charity fundraiser in the Last Year: Never true   Arboriculturist in the Last Year: Never true  Transportation Needs: No Transportation Needs   Lack of Transportation (Medical): No   Lack of Transportation (Non-Medical): No  Physical Activity: Inactive   Days of Exercise per Week: 0  days   Minutes of Exercise per Session: 0 min  Stress: No Stress Concern Present   Feeling of Stress : Only a little  Social Connections: Socially Isolated   Frequency of Communication with Friends and Family: Once a week   Frequency of Social Gatherings with Friends and Family: Never   Attends Religious Services: Never   Printmaker: No   Attends Music therapist: More than 4 times per year   Marital Status: Never married  Human resources officer Violence: Not At Risk   Fear of Current or Ex-Partner: No   Emotionally Abused: No   Physically Abused: No   Sexually Abused: No    Family History:    Family History  Problem Relation Age of Onset   Hypertension Mother    Alzheimer's disease Mother    Heart attack Mother    Heart attack Brother    Diabetes Brother      ROS:  Please see the history of present illness.   All other ROS reviewed and negative.     Physical Exam/Data:   Vitals:   07/20/21 0228 07/20/21 0228 07/20/21 0613 07/20/21 0728  BP: 112/80 112/80 98/66   Pulse: 72 72 73   Resp: 20 20 15    Temp: 98 F (36.7 C) 98 F (36.7 C) 97.9 F (36.6 C)   TempSrc: Oral     SpO2:  100% 99% 98%  Weight: (!) 223.8 kg     Height: 5\' 11"  (1.803 m)       Intake/Output Summary (Last 24 hours) at  07/20/2021 0909 Last data filed at 07/20/2021 0900 Gross per 24 hour  Intake 1480 ml  Output 900 ml  Net 580 ml   Last 3 Weights 07/20/2021 07/01/2021 06/30/2021  Weight (lbs) 493 lb 6.2 oz 484 lb 485 lb  Weight (kg) 223.8 kg 219.541 kg 219.995 kg     Body mass index is 68.81 kg/m.  General:  Well nourished, well developed, in no acute distress HEENT: normal Neck: no JVD Vascular: No carotid bruits; Distal pulses 2+ bilaterally Cardiac:  RRR, 2/6 systolic murmur rusb Lungs:  clear to auscultation bilaterally, no wheezing, rhonchi or rales  Abd: soft, nontender, no hepatomegaly  Ext: bilateral nonpitting edema Musculoskeletal:  No deformities, BUE and BLE strength normal and equal Skin: warm and dry  Neuro:  CNs 2-12 intact, no focal abnormalities noted Psych:  Normal affect    Laboratory Data:  High Sensitivity Troponin:   Recent Labs  Lab 07/19/21 1602 07/19/21 1815  TROPONINIHS 19* 18*     Chemistry Recent Labs  Lab 07/19/21 1602 07/20/21 0540  NA 135 135  K 4.2 3.3*  CL 98 99  CO2 22 23  GLUCOSE 106* 139*  BUN 81* 74*  CREATININE 4.07* 2.75*  CALCIUM 8.6* 8.6*  GFRNONAA 17* 27*  ANIONGAP 15 13    Recent Labs  Lab 07/19/21 1815  PROT 8.2*  ALBUMIN 3.7  AST 36  ALT 19  ALKPHOS 71  BILITOT 0.7   Lipids No results for input(s): CHOL, TRIG, HDL, LABVLDL, LDLCALC, CHOLHDL in the last 168 hours.  Hematology Recent Labs  Lab 07/19/21 1602 07/20/21 0540  WBC 11.7* 8.3  RBC 3.65* 3.41*  HGB 9.0* 8.3*  HCT 29.7* 28.1*  MCV 81.4 82.4  MCH 24.7* 24.3*  MCHC 30.3 29.5*  RDW 17.9* 17.9*  PLT 314 275   Thyroid No results for input(s): TSH, FREET4 in the last 168 hours.  BNPNo  results for input(s): BNP, PROBNP in the last 168 hours.  DDimer  Recent Labs  Lab 07/19/21 1602  DDIMER 0.31     Radiology/Studies:  DG Chest 2 View  Result Date: 07/19/2021 CLINICAL DATA:  Chest pain with ambulation for 2 days, shortness of breath, headache, back pain EXAM:  CHEST - 2 VIEW COMPARISON:  03/24/2021 FINDINGS: Frontal and lateral views of the chest demonstrate stable postsurgical changes from median sternotomy. 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 Mild enlargement the cardiac silhouette. Chronic central vascular prominence without superimposed airspace disease, effusion, or pneumothorax. No acute bony abnormalities. IMPRESSION: 1. Stable chest, no acute process. Electronically Signed   By: Randa Ngo M.D.   On: 07/19/2021 15:45   CT CHEST ABDOMEN PELVIS WO CONTRAST  Result Date: 07/19/2021 CLINICAL DATA:  Aortic aneurysm, known or suspected. Pt c/o chest pain, SOB and dyspnea upon exertion. Also c/o diarrhea and nausea EXAM: CT CHEST, ABDOMEN AND PELVIS WITHOUT CONTRAST TECHNIQUE: Multidetector CT imaging of the chest, abdomen and pelvis was performed following the standard protocol without IV contrast. RADIATION DOSE REDUCTION: This exam was performed according to the departmental dose-optimization program which includes automated exposure control, adjustment of the mA and/or kV according to patient size and/or use of iterative reconstruction technique. COMPARISON:  CT renal 03/24/2021 FINDINGS: CT CHEST FINDINGS Cardiovascular: Normal heart size. No significant pericardial effusion. The thoracic aorta is normal in caliber. At least mild atherosclerotic plaque of the thoracic aorta. At least 2 vessel coronary artery calcifications. Status post aortic valve replacement. Mediastinum/Nodes: No gross hilar adenopathy, noting limited sensitivity for the detection of hilar adenopathy on this noncontrast study. A borderline enlarged 1 cm right paratracheal lymph node (2:18). No enlarged or axillary lymph nodes. Thyroid gland, trachea, and esophagus demonstrate no significant findings. Lungs/Pleura: No focal consolidation. No pulmonary nodule. No pulmonary mass. No pleural effusion. No pneumothorax. Musculoskeletal: Left chest wall dual lead cardiac pacemaker in grossly  appropriate position. Marked bilateral gynecomastia. No chest wall mass or suspicious bone lesions identified. CT ABDOMEN PELVIS FINDINGS Hepatobiliary: The liver is enlarged measuring up to 20 8 cm. No focal liver abnormality. Gallbladder is contracted. No gallstones, gallbladder wall thickening, or pericholecystic fluid. No biliary dilatation. Pancreas: No focal lesion. Normal pancreatic contour. No surrounding inflammatory changes. No main pancreatic ductal dilatation. Spleen: Enlarged measuring up to 15 cm. Evaluation for focal density. Adrenals/Urinary Tract: No adrenal nodule bilaterally. No nephrolithiasis and no hydronephrosis. No definite contour-deforming renal mass. No ureterolithiasis or hydroureter. The urinary bladder is unremarkable. Stomach/Bowel: Stomach is within normal limits. No evidence of bowel wall thickening or dilatation. Appendix appears normal. Vascular/Lymphatic: No abdominal aorta or iliac aneurysm. Mild atherosclerotic plaque of the aorta and its branches. No abdominal, pelvic, or inguinal lymphadenopathy. Reproductive: Prostate is unremarkable. Other: No intraperitoneal free fluid. No intraperitoneal free gas. No organized fluid collection. Musculoskeletal: No abdominal wall hernia or abnormality. No suspicious lytic or blastic osseous lesions. No acute displaced fracture. Multilevel degenerative changes of the spine with intervertebral disc space vacuum phenomenon the L2-L3 and L3-L4 levels. IMPRESSION: 1. Hepatosplenomegaly. Markedly limited evaluation on this noncontrast study. 2. Nonspecific borderline enlarged right paratracheal lymph node (1 cm). 3. Aortic Atherosclerosis (ICD10-I70.0). Status post aortic valve repair. 4. Marked bilateral gynecomastia. Electronically Signed   By: Iven Finn M.D.   On: 07/19/2021 19:07     Assessment and Plan:   Chest pain -very mild flat trop in setting of hypotension, AKI. EKG without acute ischemic changes. Atypical chest pain  reproducible to  palpation.  Not consistent with ACS - history of aneurysm repair, symptoms not overally consistent with aortic pathology but given history would consider CTA with renal function allow. I think with low likelihood of acute pathology and increased airway risk given severe obesity weight 500 lbs would not plan for TEE to evaluate the aorta at this time.  -TTE shows normal aortic root, normal functioning AVR.     2.Anemia -Hgb down to 8.3 this admit -  He had EGD and colonoscopy in June 2022 with hemorrhoids but no other potential etiology identified, underwent more recent capsule study and unsure of results but reports consultation with [facility] scheduled for April  - being evaluated by GI this admission  3.AKI - admit BUN 81, Cr 4.07. Cr was 1.2 2 weeks ago - Cr down to 2.75 with IVFs, diuretics on hold. Was hypotensive in ER - continue IVFs  4.Hypotension - SBPs 70s in ER - has improved with IVFs but still low at times.  - elevated lactic acid though somewhat labile - pro calcitonin was negative. No acute findings on echo to support hypotension. Ongoing anemia workup with Hgb down to 8.3. BUN/Cr on admit and improving with fluid would suggest was hypovolemic on presentation - home bp meds on hold  5.PAF - anticoag held due to anemia, in ER FOBT+   Risk Assessment/Risk Scores:    For questions or updates, please contact [place] Please consult [email or web address] for contact info under    Signed, Carlyle Dolly, MD  07/20/2021 9:09 AM

## 2021-07-20 NOTE — TOC Initial Note (Signed)
Transition of Care (TOC) - Initial/Assessment Note  ? ? ?Patient Details  ?Name: Reginald [name] ?MRN: [id number] ?Date of Birth: 05/30/1970 ? ?Transition of Care (TOC) CM/SW Contact:    ?Salome Arnt, LCSW ?Phone Number: ?07/20/2021, 8:41 AM ? ?Clinical Narrative:  Pt admitted for AKI and hypotension. Assessment completed due to high risk readmission score. Pt reports he lives alone and manages fairly well, but has difficulty with bathing independently. Pt ambulates with cane. He drives himself to appointments. Pt plans to return home when medically stable, but would like home health PT and aide. He requests [facility]. Referred and accepted by [facility] with Alvis Lemmings. TOC will continue to follow.                 ? ? ?Expected Discharge Plan: [place] ?Barriers to Discharge: Continued Medical Work up ? ? ?Patient Goals and CMS Choice ?Patient states their goals for this hospitalization and ongoing recovery are:: return home ?  ?Choice offered to / list presented to : Patient ? ?Expected Discharge Plan and Services ?Expected Discharge Plan: [place] ?In-house Referral: Clinical Social Work ?  ?Post Acute Care Choice: Home Health ?Living arrangements for the past 2 months: [place] ?                ?  ?  ?  ?  ?  ?HH Arranged: PT, Nurse's Aide ?[place] Agency: [place] ?Date HH Agency Contacted: 07/20/21 ?Time [place]: 0840 ?Representative spoke with at [place]: Tommi Rumps ? ?Prior Living Arrangements/Services ?Living arrangements for the past 2 months: [place] ?Lives with:: Self ?Patient language and need for interpreter reviewed:: Yes ?Do you feel safe going back to the place where you live?: Yes      ?Need for Family Participation in Patient Care: No (Comment) ?  ?Current home services: DME (cane, shower chair) ?Criminal Activity/Legal Involvement Pertinent to Current Situation/Hospitalization: No - Comment as needed ? ?Activities of Daily  Living ?Home Assistive Devices/Equipment: CPAP ?ADL Screening (condition at time of admission) ?Patient's cognitive ability adequate to safely complete daily activities?: Yes ?Is the patient deaf or have difficulty hearing?: No ?Does the patient have difficulty seeing, even when wearing glasses/contacts?: No ?Does the patient have difficulty concentrating, remembering, or making decisions?: No ?Patient able to express need for assistance with ADLs?: Yes ?Does the patient have difficulty dressing or bathing?: No ?Independently performs ADLs?: Yes (appropriate for developmental age) ?Does the patient have difficulty walking or climbing stairs?: Yes ?Weakness of Legs: Both ?Weakness of Arms/Hands: None ? ?Permission Sought/Granted ?  ?  ?   ?   ?   ?   ? ?Emotional Assessment ?  ?  ?Affect (typically observed): Appropriate ?Orientation: : Oriented to Self, Oriented to Place, Oriented to  Time, Oriented to Situation ?Alcohol / Substance Use: Not Applicable ?Psych Involvement: No (comment) ? ?Admission diagnosis:  AKI (acute kidney injury) ([place]) [N17.9] ?Nonspecific chest pain [R07.9] ?Acute renal failure superimposed on stage 2 chronic kidney disease ([place]) [N17.9, N18.2] ?Patient Active Problem List  ? Diagnosis Date Noted  ? Acute renal failure superimposed on stage 2 chronic kidney disease ([place]) 07/19/2021  ? Chest pain 07/19/2021  ? Hypotension due to hypovolemia 07/19/2021  ? S/P nasal septoplasty 07/01/2021  ? Melena 06/13/2021  ? Wound cellulitis 06/09/2021  ? SOB (shortness of breath) 06/09/2021  ? Pain due to onychomycosis of toenails of both feet 05/02/2021  ? Diabetic  neuropathy ([place]) 05/02/2021  ? Callus of foot 05/02/2021  ? BPH with obstruction/lower urinary tract symptoms 04/05/2021  ? Acute on chronic diastolic CHF (congestive heart failure) ([place]) 03/25/2021  ? Morbid obesity with BMI of 60.0-69.9, adult ([place]) 03/25/2021  ? Mixed hyperlipidemia 03/24/2021  ? Hematuria 03/24/2021  ? Atrial fibrillation,  chronic ([place]) 03/24/2021  ? Prolonged QT interval 03/24/2021  ? Iron deficiency anemia 02/21/2021  ? Constipation 02/21/2021  ? Rectal bleeding   ? Chronic blood loss anemia 11/12/2020  ? Gastrointestinal hemorrhage 10/08/2020  ? S/P placement of cardiac pacemaker 02/10/2020  ? Encounter for examination following treatment at hospital 02/10/2020  ? Anxiety 02/10/2020  ? DM2 (diabetes mellitus, type 2) ([place]) 02/10/2020  ? OSA on CPAP   ? History of pulmonary embolism 03/16/2019  ? Paroxysmal atrial fibrillation ([place]) 09/05/2018  ? COPD (chronic obstructive pulmonary disease) ([place]) 05/07/2018  ? Gastroesophageal reflux disease   ? Pulmonary embolism ([place]) 09/18/2017  ? 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 08/23/2017  ? 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 Pulmonary hypertension ([place])   ? Chronic periodontitis 07/18/2017  ? Morbid obesity ([place])   ? Essential hypertension   ? Tobacco abuse   ? Chronic heart failure with preserved ejection fraction (HFpEF) ([place])   ? Chronic venous insufficiency   ? ?PCP:  [name], MD ?Pharmacy:   ?[facility], [address] ?[place] [address] ?Phone: [phone] Fax: [phone] ? ?[facility], [address] ?[place] ?[place] [address] ?Phone: [phone] Fax: [phone] ? ?[facility], [address] ?[place] ?[place] [address] ?Phone: [phone] Fax: [phone] ? ?[facility], [place] ?[place] ?[place] [address] ?Phone: [phone] Fax: [phone] ? ? ? ? ?Social Determinants of Health (SDOH) Interventions ?  ? ?Readmission Risk Interventions ?Readmission Risk Prevention Plan 07/20/2021  ?Transportation Screening Complete  ?Medication Review Press photographer) Complete  ?[place] or Home Care Consult Complete  ?SW Recovery  Care/Counseling Consult Complete  ?Palliative Care Screening Not Applicable  ?[place] Not Applicable  ?Some recent data might be hidden  ? ? ? ?

## 2021-07-20 NOTE — Consult Note (Signed)
Referring Provider: No ref. provider found Primary Care Physician:  [name], MD Primary Gastroenterologist:  Susanne Greenhouse  Date of Admission: 07/19/21 Date of Consultation: 07/20/21  Reason for Consultation:  anemia, FOBT positive  HPI:  Reginald [name] is a 51 y.o. year old male  with past medical history of severe AS (s/p bovine tissue AVR in 08/2017 with repair of ascending thoracic aortic aneurysm), HFpEF, paroxysmal A fib, CHF (s/p st. Jude PPM placement 08/2017) hx of bilateral PE, HTN, morbid obesity and OSA, well known to Reginald [name], who presented to the ED yesterday with c/o chest pain x2 days with L arm pain, sob, worse than baseline, no radiation of pain. No reported nausea, vomiting, diaphoresis, dizziness or lightheadedness, no abdominal pain, constipatoin, diarrhea, or hematochezia.   Consult: Patient reports he has been having exertional chest pain for the past 2-3 days with activity. Pain radiates to back and R shoulder, unsure if pain is worse with movement of his arms. Does endorse sob associated with CP. Does endorse nausea on Monday when he was picking up a trailer and started to have CP. chest pain today earlier when he was up out of bed but none currently.   Patient states that he had nasal surgery 3 weeks ago and is still having some blood loss from that. He does endorse some lower abdominal pain currently. States that he has been feeling more weak and SOB for the past week. saw PCP and had labs done in regards to his symptoms,  hgb was 10 at that time. he is maintained on xarelto and PO iron at home. has intermittent dark stools, this has been ongoing since mid last year with EGD and colonoscopy as well as givens capsule study, all without etiologic findings to suggest source of GI blood loss. last episode of melena was last week, occurring x1. He is unable to pinpoint how often he has it. Reports that he notices dark stools usually when he has diarrhea, though last  episode of diarrhea was yesterday with stools loose and stringy in nature but not black and without BRB. He rotates between constipation and diarrhea. Has associated abdominal pain at times, though unable to pinpoint exact location of this when it occurs. Denies nausea or vomiting, appetite is good, denies weight loss. He reports that he has not experienced issues with acid reflux until today when he had his echo and noted some heartburn during the evaluation.   BUN 81 on admission  Last EGD and Colonoscopy done June 2022 with Dr. Carlean Purl at [facility] as procedures was not able to be performed at [facility] due to body habitus/weight.  Last EGD: 11/14/20 normal  Last Colonoscopy:11/14/20 External and internal hemorrhoids. - The examination was otherwise normal on direct and retroflexion views. - No specimens collected.  Given's capsule study 06/30/21 Adequate study as there was presence of adequate prep and the endoscopic capsule reached the cecum.  There was presence of mild nodularity of the mid small bowel between 1:28 and 1:34  there was no presence of hematin in the GI tract or presence of vascular lesions, no ulcers.  Stool in the colon was green. [facility] by primary GI Dr. Jenetta Downer and referral to hematology also done for ongoing anemia without obvious GI source  Past Medical History:  Diagnosis Date   Anemia    Anxiety    Aortic stenosis    Arthritis    Asthma  Back pain    Cellulitis of skin with lymphangitis    CHF (congestive heart failure) (HCC)    Chronic diastolic congestive heart failure (HCC)    Chronic venous insufficiency    Constipation    COPD (chronic obstructive pulmonary disease) (HCC)    Coronary artery disease with history of myocardial infarction without history of CABG    Depression    Diabetes (HCC)    Dyspnea    Essential hypertension    Gout    Heart murmur    Hypertension    Morbid obesity ([place])    Obesity     Pneumonia    walking pneumonia   Prostatitis    Pulmonary embolism ([place])    Pulmonary hypertension (HCC)    Pyelonephritis    S/P aortic valve replacement with bioprosthetic valve 08/23/2017   25 mm Edwards Inspiris Resilia stented bovine pericardial tissue valve   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   24 mm Hemashield supracoronary straight graft    Sleep apnea    cpap    Thoracic ascending aortic aneurysm    Thoracic ascending aortic aneurysm    Tobacco abuse     Past Surgical History:  Procedure Laterality Date   AORTIC VALVE REPLACEMENT N/A 08/23/2017   Procedure: AORTIC VALVE REPLACEMENT (AVR) USING INSPIRIS RESILIA AORTIC VALVE SIZE 25 MM;  Surgeon: Rexene Alberts, MD;  Location: [place];  Service: Open Heart Surgery;  Laterality: N/A;   CARDIAC VALVE REPLACEMENT N/A    Phreesia 02/07/2020   COLONOSCOPY WITH PROPOFOL N/A 11/14/2020   Procedure: COLONOSCOPY WITH PROPOFOL;  Surgeon: Gatha Mayer, MD;  Location: [facility] ENDOSCOPY;  Service: Endoscopy;  Laterality: N/A;   ESOPHAGOGASTRODUODENOSCOPY (EGD) WITH PROPOFOL N/A 11/14/2020   Procedure: ESOPHAGOGASTRODUODENOSCOPY (EGD) WITH PROPOFOL;  Surgeon: Gatha Mayer, MD;  Location: [place];  Service: Endoscopy;  Laterality: N/A;   GIVENS CAPSULE STUDY N/A 06/30/2021   Procedure: GIVENS CAPSULE STUDY;  Surgeon: Harvel Quale, MD;  [facility];  Service: Gastroenterology;  Laterality: N/A;  7:30   MULTIPLE EXTRACTIONS WITH ALVEOLOPLASTY N/A 07/23/2017   Procedure: Extraction of tooth #10 with alveoloplasty and gross debridement of remaining teeth;  Surgeon: Lenn Cal, DDS;  Location: [place];  Service: Oral Surgery;  Laterality: N/A;   NASAL SEPTOPLASTY W/ TURBINOPLASTY Bilateral 07/01/2021   Procedure: NASAL SEPTOPLASTY WITH TURBINATE REDUCTION;  Surgeon: Leta Baptist, MD;  Location: [place];  Service: ENT;  Laterality: Bilateral;   PACEMAKER IMPLANT N/A 08/28/2017    St Jude Medical Assurity MRI  conditional  dual-chamber pacemaker for symptomatic complete heart blockby Dr Rayann Heman   TEE WITHOUT CARDIOVERSION N/A 07/16/2017   Procedure: TRANSESOPHAGEAL ECHOCARDIOGRAM (TEE);  Surgeon: Lelon Perla, MD;  Location: [facility] ENDOSCOPY;  Service: Cardiovascular;  Laterality: N/A;   TEE WITHOUT CARDIOVERSION N/A 08/23/2017   Procedure: TRANSESOPHAGEAL ECHOCARDIOGRAM (TEE);  Surgeon: Rexene Alberts, MD;  Location: [place];  Service: Open Heart Surgery;  Laterality: N/A;   THORACIC AORTIC ANEURYSM REPAIR N/A 08/23/2017   Procedure: THORACIC ASCENDING ANEURYSM REPAIR (AAA) USING HEMASHIELD GOLD KNITTED MICROVEL DOUBLE VELOUR VASCULAR GRAFT D: 24 MM  L: 30 CM;  Surgeon: Rexene Alberts, MD;  Location: [place];  Service: Open Heart Surgery;  Laterality: N/A;    Prior to Admission medications   Medication Sig Start Date End Date Taking? Authorizing Provider  acetaminophen (TYLENOL) 325 MG tablet Take 650 mg by mouth every 6 (six) hours as needed for mild pain.    [provider]  albuterol (PROVENTIL) (2.5 MG/3ML) 0.083% nebulizer solution INHALE THE CONTENTS OF 1 VIAL VIA NEBULIZER EVERY 6 HOURS AS NEEDED FOR WHEEZING OR SHORTNESS OF BREATH 07/13/21   [name], MD  albuterol (VENTOLIN HFA) 108 (90 Base) MCG/ACT inhaler INHALE 2 PUFFS INTO THE LUNGS EVERY 6 (SIX) HOURS AS NEEDED FOR WHEEZING OR SHORTNESS OF BREATH. 06/01/21   [name], MD  allopurinol (ZYLOPRIM) 300 MG tablet TAKE 1 TABLET EVERY DAY 04/06/21   [name], MD  atorvastatin (LIPITOR) 40 MG tablet TAKE 1 TABLET EVERY DAY 02/14/21   [name], MD  Blood Glucose Monitoring Suppl (TRUE METRIX METER) w/Device KIT USE AS DIRECTED 08/19/20   [name], MD  buPROPion (WELLBUTRIN XL) 300 MG 24 hr tablet Take 1 tablet (300 mg total) by mouth daily. 02/17/21   [name], MD  Cholecalciferol 125 MCG (5000 UT) TABS Take 5,000 Units by mouth daily.    [provider]  docusate sodium (COLACE) 100 MG  capsule Take 2 capsules (200 mg total) by mouth 2 (two) times daily. Patient taking differently: Take 200 mg by mouth daily. 11/15/20   Thurnell Lose, MD  empagliflozin (JARDIANCE) 10 MG TABS tablet Take 1 tablet (10 mg total) by mouth daily before breakfast. Patient not taking: Reported on 07/06/2021 01/12/21   Erma Heritage, PA-C  FEROSUL 325 (65 Fe) MG tablet TAKE 1 TABLET EVERY DAY 07/18/21   [name], MD  fexofenadine (ALLEGRA) 180 MG tablet Take 180 mg by mouth daily.    [provider]  gabapentin (NEURONTIN) 100 MG capsule TAKE 1 CAPSULE IN THE MORNING AND TAKE 2 CAPSULES IN THE EVENING 03/09/21   [name], MD  glipiZIDE (GLUCOTROL XL) 5 MG 24 hr tablet Take 1 tablet by mouth once daily with breakfast 06/28/21   [name], MD  hydrOXYzine (ATARAX/VISTARIL) 25 MG tablet Take 1 tablet (25 mg total) by mouth every 8 (eight) hours as needed. 02/03/21   [name], MD  Krill Oil 350 MG CAPS Take 350 mg by mouth daily.    [provider]  lisinopril (ZESTRIL) 20 MG tablet Take 1 tablet (20 mg total) by mouth daily. 05/13/21   [name], MD  metFORMIN (GLUCOPHAGE) 850 MG tablet TAKE 1 TABLET TWICE DAILY WITH MEALS 03/11/21   [name], MD  metolazone (ZAROXOLYN) 5 MG tablet TAKE 1 TABLET (5 MG TOTAL) BY MOUTH THREE TIMES A WEEK. Patient taking differently: Take 5 mg by mouth See admin instructions. TAKE 1 TABLET (5 MG TOTAL) BY MOUTH THREE TIMES A WEEK AS NEEDED 01/26/21   Allred, Jeneen Rinks, MD  metoprolol tartrate (LOPRESSOR) 100 MG tablet TAKE 1 TABLET TWICE DAILY 12/27/20   Josue Hector, MD  Misc. Devices MISC Sequential compression device - 1 pair. Apply them for 1 hour in the morning and 1 hour in the evening. 07/18/21   [name], MD  pantoprazole (PROTONIX) 40 MG tablet TAKE 1 TABLET EVERY DAY 05/05/21   [name], MD  potassium chloride SA (KLOR-CON) 20 MEQ tablet TAKE 3 TABS DAILY AND TAKE AN ADDITIONAL 1 TAB ON THE DAY YOU  TAKE YOUR WEEKLY METOLAZONE DOSE 12/22/20   Ahmed Prima, Fransisco Hertz, PA-C  Semaglutide (RYBELSUS) 14 MG TABS Take 14 mg by mouth in the morning. 02/08/21   [name], MD  tamsulosin (FLOMAX) 0.4 MG CAPS capsule Take 1 capsule (0.4 mg total) by mouth  daily. 04/05/21   Stoneking, Reece Leader., MD  Tiotropium Bromide Monohydrate (SPIRIVA RESPIMAT) 2.5 MCG/ACT AERS Inhale 2 puffs into the lungs daily. 02/03/21   [name], MD  torsemide (DEMADEX) 20 MG tablet Take 2 tablets (40 mg total) by mouth 2 (two) times daily. 02/04/21   Strader, Fransisco Hertz, PA-C  TRUE METRIX BLOOD GLUCOSE TEST test strip TEST ONE TIME DAILY FOR DIABETES 10/25/20   [name], MD  TRUEplus Lancets 33G MISC USE TO TEST ONE TIME DAILY FOR DIABETES 10/25/20   [name], MD  TURMERIC-GINGER PO Take 2 each by mouth daily.    [provider]    Current Facility-Administered Medications  Medication Dose Route Frequency Provider Last Rate Last Admin   acetaminophen (TYLENOL) tablet 650 mg  650 mg Oral Q6H PRN Opyd, Ilene Qua, MD   650 mg at 07/19/21 2333   Or   acetaminophen (TYLENOL) suppository 650 mg  650 mg Rectal Q6H PRN Opyd, Ilene Qua, MD       albuterol (PROVENTIL) (2.5 MG/3ML) 0.083% nebulizer solution 2.5 mg  2.5 mg Inhalation Q6H PRN Opyd, Ilene Qua, MD       allopurinol (ZYLOPRIM) tablet 300 mg  300 mg Oral Daily Opyd, Ilene Qua, MD   300 mg at 07/20/21 0950   atorvastatin (LIPITOR) tablet 40 mg  40 mg Oral Daily Opyd, Ilene Qua, MD   40 mg at 07/20/21 0915   gabapentin (NEURONTIN) capsule 200 mg  200 mg Oral QHS Opyd, Ilene Qua, MD   200 mg at 07/19/21 2321   hydrOXYzine (ATARAX) tablet 25 mg  25 mg Oral Q8H PRN Opyd, Ilene Qua, MD       insulin aspart (novoLOG) injection 0-6 Units  0-6 Units Subcutaneous TID WC Opyd, Ilene Qua, MD       lactated ringers infusion   Intravenous Continuous Shahmehdi, Seyed A, MD       pantoprazole (PROTONIX) EC tablet 40 mg  40 mg Oral BID Shahmehdi, Seyed A, MD   40 mg  at 07/20/21 0951   potassium chloride SA (KLOR-CON M) CR tablet 20 mEq  20 mEq Oral BID Skipper Cliche A, MD   20 mEq at 07/20/21 0950   tamsulosin (FLOMAX) capsule 0.4 mg  0.4 mg Oral Daily Opyd, Ilene Qua, MD   0.4 mg at 07/20/21 0915   umeclidinium bromide (INCRUSE ELLIPTA) 62.5 MCG/ACT 1 puff  1 puff Inhalation Daily Opyd, Ilene Qua, MD   1 puff at 07/20/21 0728    Allergies as of 07/19/2021   (No Known Allergies)    Family History  Problem Relation Age of Onset   Hypertension Mother    Alzheimer's disease Mother    Heart attack Mother    Heart attack Brother    Diabetes Brother     Social History   Socioeconomic History   Marital status: Divorced    Spouse name: Not on file   Number of children: Not on file   Years of education: Not on file   Highest education level: Not on file  Occupational History   Not on file  Tobacco Use   Smoking status: Former    Packs/day: 2.00    Years: 16.00    Pack years: 32.00    Types: Cigarettes    Quit date: 08/23/2017    Years since quitting: 3.9   Smokeless tobacco: Never  Vaping Use   Vaping Use: Never used  Substance and Sexual Activity   Alcohol  use: No    Comment: have had alcohol in the past, not heavy   Drug use: No   Sexual activity: Not Currently    Partners: Female  Other Topics Concern   Not on file  Social History Narrative   Not on file   Social Determinants of Health   Financial Resource Strain: Low Risk    Difficulty of Paying Living Expenses: Not hard at all  Food Insecurity: No Food Insecurity   Worried About Charity fundraiser in the Last Year: Never true   [place] in the Last Year: Never true  Transportation Needs: No Transportation Needs   Lack of Transportation (Medical): No   Lack of Transportation (Non-Medical): No  Physical Activity: Inactive   Days of Exercise per Week: 0 days   Minutes of Exercise per Session: 0 min  Stress: No Stress Concern Present   Feeling of Stress : Only a  little  Social Connections: Socially Isolated   Frequency of Communication with Friends and Family: Once a week   Frequency of Social Gatherings with Friends and Family: Never   Attends Religious Services: Never   Printmaker: No   Attends Music therapist: More than 4 times per year   Marital Status: Never married  Human resources officer Violence: Not At Risk   Fear of Current or Ex-Partner: No   Emotionally Abused: No   Physically Abused: No   Sexually Abused: No   Review of Systems: Gen: Denies fever, chills, loss of appetite, change in weight or weight loss CV: Denies heart palpitations, syncope, +chest pain Resp: Denies  cough, wheezing +sob with exertion GI: Denies dysphagia or odynophagia. Denies vomiting blood, jaundice, and fecal incontinence. +melena +diarrhea +constipation GU : Denies urinary burning, urinary frequency, urinary incontinence.  MS: Denies joint pain,swelling, cramping Derm: Denies rash, itching, dry skin Psych: Denies depression, anxiety,confusion, or memory loss Heme: Denies bruising, bleeding, and enlarged lymph nodes.  Physical Exam: Vital signs in last 24 hours: Temp:  [97.9 F (36.6 C)-98.1 F (36.7 C)] 97.9 F (36.6 C) (03/01 1610) Pulse Rate:  [71-83] 71 (03/01 0909) Resp:  [13-24] 15 (03/01 0613) BP: (73-123)/(40-84) 98/66 (03/01 0613) SpO2:  [96 %-100 %] 100 % (03/01 0909) FiO2 (%):  [21 %] 21 % (02/28 2130) Weight:  [223.8 kg] 223.8 kg (03/01 0228)   General:   Alert,  Well-developed, well-nourished, pleasant and cooperative in NAD Head:  Normocephalic and atraumatic. Eyes:  Sclera clear, no icterus.   Conjunctiva pink. Nose:  No deformity, discharge,  or lesions. Mouth:  No deformity or lesions, dentition normal. Lungs:  Clear throughout to auscultation.   No wheezes, crackles, or rhonchi. No acute distress. Heart:  Regular rate and rhythm; no murmurs, clicks, rubs,  or gallops. Abdomen:  Soft, full,  non tender. Rectal:  Deferred until time of colonoscopy.   Msk:  Symmetrical without gross deformities. Normal posture. Pulses:  Normal pulses noted. Extremities:  pitting edema to  lower extremities Neurologic:  Alert and  oriented x4;  grossly normal neurologically. Skin:  Intact without significant lesions or rashes. Psych:  Alert and cooperative. Normal mood and affect.  Intake/Output from previous day: 02/28 0701 - 03/01 0700 In: 1480 [P.O.:480; IV Piggyback:1000] Out: 500 [Urine:500] Intake/Output this shift: Total I/O In: -  Out: 400 [Urine:400]  Lab Results: Recent Labs    07/19/21 1602 07/20/21 0540  WBC 11.7* 8.3  HGB 9.0* 8.3*  HCT 29.7* 28.1*  PLT 314 275   BMET Recent Labs    07/19/21 1602 07/20/21 0540  NA 135 135  K 4.2 3.3*  CL 98 99  CO2 22 23  GLUCOSE 106* 139*  BUN 81* 74*  CREATININE 4.07* 2.75*  CALCIUM 8.6* 8.6*   LFT Recent Labs    07/19/21 1815  PROT 8.2*  ALBUMIN 3.7  AST 36  ALT 19  ALKPHOS 71  BILITOT 0.7  BILIDIR 0.2  IBILI 0.5    Studies/Results: DG Chest 2 View  Result Date: 07/19/2021 CLINICAL DATA:  Chest pain with ambulation for 2 days, shortness of breath, headache, back pain EXAM: CHEST - 2 VIEW COMPARISON:  03/24/2021 FINDINGS: Frontal and lateral views of the chest demonstrate stable postsurgical changes from median sternotomy. 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 Mild enlargement the cardiac silhouette. Chronic central vascular prominence without superimposed airspace disease, effusion, or pneumothorax. No acute bony abnormalities. IMPRESSION: 1. Stable chest, no acute process. Electronically Signed   By: Randa Ngo M.D.   On: 07/19/2021 15:45   CT CHEST ABDOMEN PELVIS WO CONTRAST  Result Date: 07/19/2021 CLINICAL DATA:  Aortic aneurysm, known or suspected. Pt c/o chest pain, SOB and dyspnea upon exertion. Also c/o diarrhea and nausea EXAM: CT CHEST, ABDOMEN AND PELVIS WITHOUT CONTRAST TECHNIQUE: Multidetector CT  imaging of the chest, abdomen and pelvis was performed following the standard protocol without IV contrast. RADIATION DOSE REDUCTION: This exam was performed according to the departmental dose-optimization program which includes automated exposure control, adjustment of the mA and/or kV according to patient size and/or use of iterative reconstruction technique. COMPARISON:  CT renal 03/24/2021 FINDINGS: CT CHEST FINDINGS Cardiovascular: Normal heart size. No significant pericardial effusion. The thoracic aorta is normal in caliber. At least mild atherosclerotic plaque of the thoracic aorta. At least 2 vessel coronary artery calcifications. Status post aortic valve replacement. Mediastinum/Nodes: No gross hilar adenopathy, noting limited sensitivity for the detection of hilar adenopathy on this noncontrast study. A borderline enlarged 1 cm right paratracheal lymph node (2:18). No enlarged or axillary lymph nodes. Thyroid gland, trachea, and esophagus demonstrate no significant findings. Lungs/Pleura: No focal consolidation. No pulmonary nodule. No pulmonary mass. No pleural effusion. No pneumothorax. Musculoskeletal: Left chest wall dual lead cardiac pacemaker in grossly appropriate position. Marked bilateral gynecomastia. No chest wall mass or suspicious bone lesions identified. CT ABDOMEN PELVIS FINDINGS Hepatobiliary: The liver is enlarged measuring up to 20 8 cm. No focal liver abnormality. Gallbladder is contracted. No gallstones, gallbladder wall thickening, or pericholecystic fluid. No biliary dilatation. Pancreas: No focal lesion. Normal pancreatic contour. No surrounding inflammatory changes. No main pancreatic ductal dilatation. Spleen: Enlarged measuring up to 15 cm. Evaluation for focal density. Adrenals/Urinary Tract: No adrenal nodule bilaterally. No nephrolithiasis and no hydronephrosis. No definite contour-deforming renal mass. No ureterolithiasis or hydroureter. The urinary bladder is unremarkable.  Stomach/Bowel: Stomach is within normal limits. No evidence of bowel wall thickening or dilatation. Appendix appears normal. Vascular/Lymphatic: No abdominal aorta or iliac aneurysm. Mild atherosclerotic plaque of the aorta and its branches. No abdominal, pelvic, or inguinal lymphadenopathy. Reproductive: Prostate is unremarkable. Other: No intraperitoneal free fluid. No intraperitoneal free gas. No organized fluid collection. Musculoskeletal: No abdominal wall hernia or abnormality. No suspicious lytic or blastic osseous lesions. No acute displaced fracture. Multilevel degenerative changes of the spine with intervertebral disc space vacuum phenomenon the L2-L3 and L3-L4 levels. IMPRESSION: 1. Hepatosplenomegaly. Markedly limited evaluation on this noncontrast study. 2. Nonspecific borderline enlarged right paratracheal lymph node (1 cm). 3.  Aortic Atherosclerosis (ICD10-I70.0). Status post aortic valve repair. 4. Marked bilateral gynecomastia. Electronically Signed   By: Iven Finn M.D.   On: 07/19/2021 19:07    Impression: Reginald [name] is a 51 y.o. year old male  with past medical history of severe AS (s/p bovine tissue AVR in 08/2017 with repair of ascending thoracic aortic aneurysm), HFpEF, paroxysmal A fib, CHF (s/p st. Jude PPM placement 08/2017) hx of bilateral PE, HTN, morbid obesity and OSA, well known to [place], who presented to the ED yesterday with c/o chest pain x2 days with L arm pain, sob, worse than baseline, no radiation of pain.  Found to be anemic at 9 with positive FOBT. GI consulted for further evaluation.  Chronic anemia with melena: on xarelto outpatient, currently being held. intermittent melena, heme positive stool and fluctuating hemoglobin since June 2022, EGD and Colonoscopy at that time with only findings of internal and external hemorrhoids, Given's capsule study completed 06/30/21 after patient continued with intermittent  melena, anemia and iron studies with hgb of 11,  MCV 78, though iron studies mostly on lower end of normal with Iron 85, saturation 22% with ferritin and TIBC in mid range at 279 and 387 respectively. Capsule study also unremarkable other than possible mass seen, pt referred to [facility] for further evaluation of this, though no sources of bleeding noted at that time. Pt maintained on PO iron on outpatient basis. Reports that he began feeling more fatigued about a week ago, had labs done at PCP where hgb was 10, down from 11 at the end of January. Continues to have intermittent melena, at times occurring with more diarrhea like stools, though he is unable to pinpoint how often this occurs. Denies hematochezia, he does tell me he continues to have some bleeding from a recent nose surgery, though hgb dropped from 10.2 on 2/14 to 9 on admission and 8.3 today. MCV 82.4. Notably BUN is elevated this admission 74 today, 81 initially, however, creatnine also elevated at 4.07, down to 2.75. patient would likely benefit from repeat Upper GI evaluation, though due to weight/body habitus this had to be performed at [facility] previously as it was felt patient was high risk and would likely be safer undergoing anesthesia at a larger facility with more readily available resources. Should continue to monitor hemoglobin and transfuse as needed, if overt GI bleeding or further drops in hgb occur, pt may require transfer to [facility] for further endoscopic evaluation.    Plan: Trend H&H, transfuse <7 Consider EGD if overt GI bleeding or hgb continues to downtrend, requiring transfusion (would likely need to be done at [facility]) Monitor for overt GI bleeding Continue supportive measures Keep appt with [facility] in April Hold xarelto Continue PPI BID   LOS: 1 day    07/20/2021, 10:27 AM   Chelsea L. Alver Sorrow, MSN, APRN, AGNP-C Adult-Gerontology Nurse Practitioner [facility]

## 2021-07-21 ENCOUNTER — Telehealth: Payer: Self-pay | Admitting: Gastroenterology

## 2021-07-21 ENCOUNTER — Telehealth: Payer: Self-pay | Admitting: *Deleted

## 2021-07-21 ENCOUNTER — Telehealth: Payer: Medicare HMO

## 2021-07-21 DIAGNOSIS — D500 Iron deficiency anemia secondary to blood loss (chronic): Secondary | ICD-10-CM

## 2021-07-21 LAB — GLUCOSE, CAPILLARY
Glucose-Capillary: 122 mg/dL — ABNORMAL HIGH (ref 70–99)
Glucose-Capillary: 127 mg/dL — ABNORMAL HIGH (ref 70–99)

## 2021-07-21 LAB — IRON AND TIBC
Iron: 59 ug/dL (ref 45–182)
Saturation Ratios: 16 % — ABNORMAL LOW (ref 17.9–39.5)
TIBC: 373 ug/dL (ref 250–450)
UIBC: 314 ug/dL

## 2021-07-21 LAB — CBC
HCT: 27.3 % — ABNORMAL LOW (ref 39.0–52.0)
Hemoglobin: 8.4 g/dL — ABNORMAL LOW (ref 13.0–17.0)
MCH: 25.4 pg — ABNORMAL LOW (ref 26.0–34.0)
MCHC: 30.8 g/dL (ref 30.0–36.0)
MCV: 82.5 fL (ref 80.0–100.0)
Platelets: 247 10*3/uL (ref 150–400)
RBC: 3.31 MIL/uL — ABNORMAL LOW (ref 4.22–5.81)
RDW: 17.4 % — ABNORMAL HIGH (ref 11.5–15.5)
WBC: 6 10*3/uL (ref 4.0–10.5)
nRBC: 0 % (ref 0.0–0.2)

## 2021-07-21 LAB — BASIC METABOLIC PANEL
Anion gap: 10 (ref 5–15)
BUN: 47 mg/dL — ABNORMAL HIGH (ref 6–20)
CO2: 25 mmol/L (ref 22–32)
Calcium: 8.8 mg/dL — ABNORMAL LOW (ref 8.9–10.3)
Chloride: 101 mmol/L (ref 98–111)
Creatinine, Ser: 1.51 mg/dL — ABNORMAL HIGH (ref 0.61–1.24)
GFR, Estimated: 56 mL/min — ABNORMAL LOW (ref >60)
Glucose, Bld: 170 mg/dL — ABNORMAL HIGH (ref 70–99)
Potassium: 3.8 mmol/L (ref 3.5–5.1)
Sodium: 136 mmol/L (ref 135–145)

## 2021-07-21 LAB — UREA NITROGEN, URINE: Urea Nitrogen, Ur: 241 mg/dL

## 2021-07-21 LAB — PREPARE RBC (CROSSMATCH)

## 2021-07-21 MED ORDER — FUROSEMIDE 10 MG/ML IJ SOLN
INTRAMUSCULAR | Status: AC
  Filled 2021-07-21: qty 2

## 2021-07-21 MED ORDER — FUROSEMIDE 10 MG/ML IJ SOLN
20.0000 mg | Freq: Once | INTRAMUSCULAR | Status: AC
  Administered 2021-07-21: 20 mg via INTRAVENOUS

## 2021-07-21 MED ORDER — TORSEMIDE 20 MG PO TABS: 40.0000 mg | ORAL_TABLET | Freq: Every day | ORAL | 0 refills | Status: DC

## 2021-07-21 MED ORDER — FUROSEMIDE 10 MG/ML IJ SOLN
20.0000 mg | Freq: Once | INTRAMUSCULAR | Status: AC
  Administered 2021-07-21: 20 mg via INTRAVENOUS
  Filled 2021-07-21: qty 2

## 2021-07-21 MED ORDER — TORSEMIDE 20 MG PO TABS: 40.0000 mg | ORAL_TABLET | Freq: Once | ORAL | 3 refills | Status: DC

## 2021-07-21 MED ORDER — SODIUM CHLORIDE 0.9% IV SOLUTION: Freq: Once | INTRAVENOUS | Status: AC

## 2021-07-21 NOTE — Telephone Encounter (Signed)
Patient going home from hospital today. Please make arrangements for follow up for anemia. He has appt with [facility] for small bowel lesion in 08/2021 but needs to be seen in 1-2 weeks or at least have updated labs.  ?

## 2021-07-21 NOTE — Discharge Summary (Addendum)
Physician Discharge Summary   Patient: Reginald [name] MRN: [id number] DOB: [birth year]  Admit date:     07/19/2021  Discharge date: 07/21/21  Discharge Physician: Deatra James   PCP: [name], MD   Recommendations at discharge:   Follow-up with gastroenterologist within 1 week Follow with PCP in 1-2 weeks Follow-up with cardiologist in 1-2 weeks Lab work including CBC CMP in 3-5 days results to PCP, nephrology and cardiologist -Note to PCP/patient: Resume Xarelto if hemoglobin remained stable, and creatinine improved -  within next few days... Today's hemoglobin 8.3-transfusing 1 unit PRBC on 07/21/2021  Discharge Diagnoses: Principal Problem:   Acute renal failure superimposed on stage 2 chronic kidney disease (HCC) Active Problems:   Chronic heart failure with preserved ejection fraction (HFpEF) (HCC)   COPD (chronic obstructive pulmonary disease) (HCC)   Paroxysmal atrial fibrillation (HCC)   History of pulmonary embolism   OSA on CPAP   DM2 (diabetes mellitus, type 2) (HCC)   Chronic blood loss anemia   Prolonged QT interval   Morbid obesity with BMI of 60.0-69.9, adult (HCC)   Chest pain   Hypotension due to hypovolemia   Symptomatic anemia  Resolved Problems:   * No resolved hospital problems. *  Per HPI: Reginald [name] is a pleasant 51 y.o. male with medical history significant for BMI 43, PAF on Xarelto, history of PE, history of severe aortic stenosis and ascending aortic aneurysm status post aneurysm repair and bioprosthetic AVR, renal insufficiency, HFpEF, COPD, OSA on CPAP, type 2 diabetes mellitus, and chronic blood loss anemia, presenting to the emergency department for evaluation of chest pain and fatigue.  Patient reports that he had been lifting something heavy a couple days ago when he developed pain in the central chest.  Initially, pain resolved after 30 minutes but has returned when he is active and when he moves his arms.  He reports increased  exertional dyspnea and fatigue as well but denies any cough, wheezing, fever, or chills.  He has had several watery stools today but not yesterday.  He denies abdominal pain, nausea, or vomiting.  He reports occasional dark stool but none in the past couple days.  States that he is scheduled to see hematology on 07/29/2021 for his anemia and scheduled to see gastroenterology at [facility] on April 14 for the same reason.   ED Course:  Upon arrival to the ED, afebrile, hypotension with systolic blood pressure as low as 73.  EKG features sinus rhythm with first-degree AV nodal block, RBBB, and prolonged QT interval.  Chest x-ray negative for acute cardiopulmonary disease.  CT of the chest/abdomen/pelvis without contrast is negative for hydronephrosis or acute intrathoracic finding but notable for hepatomegaly.  Labs:  BUN of 81 and creatinine 4.07.  CBC mild leukocytosis and hemoglobin 9.0.  Lactic acid went from 3.0-2.1.   Troponin was 19 and then 18.   D-dimer was normal.  ED PA reports that stool was grossly normal but FOBT positive.  The PA discussed the case with cardiology and GI, treated the patient with 325 mg of aspirin, IV PPI, and 1 L of saline    AKI superimposed on CKD IIIa - SCr 4.07 on admission,>>> 2.75  >> 1.51 this morning - (Cr 1.20 two weeks ago)  - No hydro on CT, hypotensive in ED and likely prerenal  -Likely due to dehydration, hypotension -nephrotoxic lisinopril, Jardiance, metformin ---given a liter of NS in ED  -Resuming diuretics at half dose, discontinue lisinopril (due  to hypotension_   Hypotension - SBP as low as 70s in ED, improved to 90s after a liter of IVF -Blood pressure still soft 90/54 - No fever or apparent infection, no overt bleeding, d-dimer low - Responding to IVF, hypovolemia seems most likely  --Resuming diuretics at half dose, discontinue lisinopril -Blood pressure stable 119/79   Chest pain  - Presents with 2 days of CP that started after heavy lifting,  is reproducible, and most likely MSK  - Troponin was 19 then 18 in ED, d-dimer low, no acute EKG changes noted, and no acute findings on chest CT   -Acute ACS was ruled out -History of coronary artery disease, anemia hemoglobin dropped to 8.4 With progressive weakness, requesting for blood transfusion  Pros and cons of blood transfusion was discussed with patient detail We will proceed with 1 U PRBC blood transfusion today 07/21/2021    PAF  - In SR in ED  - was Holding  Xarelto initially in light of low GFR and acute on chronic anemia with occult GIB --Resume okay with GI   HFpEF    - He is hypotensive in ED, difficult to determine volume status d/t body habitus but suspected to be hypovolemic  - Hold diuretics, monitor wt and I/Os, hold ACE and beta-blocker in light of hypotension and AKI   -TTE normal aortic root, normal radial   Anemia of chronic disease - Chronic GI bleed - Hgb is 9.0, continuing slow downtrend >>> 8.3 - ED PA reports grossly normal stool that was FOBT+  - He had EGD and colonoscopy in June 2022 with hemorrhoids but no other potential etiology identified, underwent more recent capsule study and unsure of results but reports consultation with [facility] scheduled for April  - was Hold Xarelto initially while trending  -to be resumed by PCP once stable from GI standpoint -GI was consulted, given recent history of work-up did not recommend any further work-up at this facility, patient is to follow-up with double-balloon endoscopy and [facility]  -History of coronary artery disease, anemia hemoglobin dropped to 8.4 With progressive weakness, requesting for blood transfusion  Pros and cons of blood transfusion was discussed with patient detail We will proceed with 1 U PRBC blood transfusion today 07/21/2021      COPD; OSA  - No cough or wheezing on admission  - Continue Spiriva, CPAP qHS   -Stable on room air   Type II DM  - A1c was 5.7% in Feb 2023  -Checking CBG q. ACH  S, SSI coverage -Home insulin regimen medication was resumed with exception of metformin   Prolonged QT  - Continue cardiac monitoring, check magnesium, avoid QT-prolonging medications    Hx of PE  - D-dimer normal in ED  - Xarelto held initially given low GFR       Morbid obesity Body mass index is 68.81 kg/m. -Discussed with patient regarding healthy lifestyle, exercise, weight loss program, and close follow-up with PCP      Consultants: Gastroenterologist/cardiologist Procedures performed: None Disposition: Home Diet recommendation:  Discharge Diet Orders (From admission, onward)     Start     Ordered   07/21/21 0000  Diet - low sodium heart healthy        07/21/21 1156           Carb modified diet  DISCHARGE MEDICATION: Allergies as of 07/21/2021   No Known Allergies      Medication List     STOP taking these medications  hydrOXYzine 25 MG tablet Commonly known as: ATARAX   lisinopril 20 MG tablet Commonly known as: ZESTRIL   metFORMIN 850 MG tablet Commonly known as: GLUCOPHAGE       TAKE these medications    acetaminophen 325 MG tablet Commonly known as: TYLENOL Take 650 mg by mouth every 6 (six) hours as needed for mild pain.   albuterol 108 (90 Base) MCG/ACT inhaler Commonly known as: VENTOLIN HFA INHALE 2 PUFFS INTO THE LUNGS EVERY 6 (SIX) HOURS AS NEEDED FOR WHEEZING OR SHORTNESS OF BREATH.   albuterol (2.5 MG/3ML) 0.083% nebulizer solution Commonly known as: PROVENTIL INHALE THE CONTENTS OF 1 VIAL VIA NEBULIZER EVERY 6 HOURS AS NEEDED FOR WHEEZING OR SHORTNESS OF BREATH   allopurinol 300 MG tablet Commonly known as: ZYLOPRIM TAKE 1 TABLET EVERY DAY   atorvastatin 40 MG tablet Commonly known as: LIPITOR TAKE 1 TABLET EVERY DAY   buPROPion 300 MG 24 hr tablet Commonly known as: WELLBUTRIN XL Take 1 tablet (300 mg total) by mouth daily.   Cholecalciferol 125 MCG (5000 UT) Tabs Take 5,000 Units by mouth daily.   docusate  sodium 100 MG capsule Commonly known as: COLACE Take 2 capsules (200 mg total) by mouth 2 (two) times daily. What changed: when to take this   empagliflozin 10 MG Tabs tablet Commonly known as: Jardiance Take 1 tablet (10 mg total) by mouth daily before breakfast.   FeroSul 325 (65 FE) MG tablet Generic drug: ferrous sulfate TAKE 1 TABLET EVERY DAY   fexofenadine 180 MG tablet Commonly known as: ALLEGRA Take 180 mg by mouth daily.   gabapentin 100 MG capsule Commonly known as: NEURONTIN TAKE 1 CAPSULE IN THE MORNING AND TAKE 2 CAPSULES IN THE EVENING   glipiZIDE 5 MG 24 hr tablet Commonly known as: GLUCOTROL XL Take 1 tablet by mouth once daily with breakfast   Krill Oil 350 MG Caps Take 350 mg by mouth daily.   metolazone 5 MG tablet Commonly known as: ZAROXOLYN TAKE 1 TABLET (5 MG TOTAL) BY MOUTH THREE TIMES A WEEK. What changed:  how much to take how to take this when to take this additional instructions   metoprolol tartrate 100 MG tablet Commonly known as: LOPRESSOR TAKE 1 TABLET TWICE DAILY   Misc. Devices Misc Sequential compression device - 1 pair. Apply them for 1 hour in the morning and 1 hour in the evening.   pantoprazole 40 MG tablet Commonly known as: PROTONIX TAKE 1 TABLET EVERY DAY   potassium chloride SA 20 MEQ tablet Commonly known as: KLOR-CON M TAKE 3 TABS DAILY AND TAKE AN ADDITIONAL 1 TAB ON THE DAY YOU TAKE YOUR WEEKLY METOLAZONE DOSE   Rybelsus 14 MG Tabs Generic drug: Semaglutide Take 14 mg by mouth in the morning.   Spiriva Respimat 2.5 MCG/ACT Aers Generic drug: Tiotropium Bromide Monohydrate Inhale 2 puffs into the lungs daily.   tamsulosin 0.4 MG Caps capsule Commonly known as: FLOMAX Take 1 capsule (0.4 mg total) by mouth daily.   torsemide 20 MG tablet Commonly known as: DEMADEX Take 2 tablets (40 mg total) by mouth once for 1 dose. What changed: when to take this   True Metrix Blood Glucose Test test strip Generic  drug: glucose blood TEST ONE TIME DAILY FOR DIABETES   True Metrix Meter w/Device Kit USE AS DIRECTED   TRUEplus Lancets 33G Misc USE TO TEST ONE TIME DAILY FOR DIABETES   TURMERIC-GINGER PO Take 2 each by mouth daily.  Discharge Care Instructions  (From admission, onward)           Start     Ordered   07/21/21 0000  Discharge wound care:       Comments: Per wound care nurse instructions   07/21/21 1156            Follow-up Information     [facility].   Specialty: Home Health Services Why: Will contact you to schedule home health visits. Contact information: [address] [place] [address] [phone]         Erma Heritage, PA-C. Call.   Specialties: Physician Assistant, Cardiology Why: call tomorrow for appt in next week Contact information: [place]  [address] [phone]                 Discharge Exam: Danley Danker Weights   07/20/21 0228 07/21/21 0444  Weight: (!) 223.8 kg (!) 224 kg      Physical Exam:   General:  Alert, oriented, cooperative, no distress; morbidly obese male  HEENT:  Normocephalic, PERRL, otherwise with in Normal limits   Neuro:  CNII-XII intact. , normal motor and sensation, reflexes intact   Lungs:   Clear to auscultation BL, Respirations unlabored, no wheezes / crackles  Cardio:    S1/S2, RRR, No murmure, No Rubs or Gallops   Abdomen:   Soft, non-tender, bowel sounds active all four quadrants,  no guarding or peritoneal signs.  Muscular skeletal:  Limited exam - in bed, able to move all 4 extremities, Normal strength,  2+ pulses,  symmetric, +2  pitting edema  Skin:  Dry, warm to touch, negative for any Rashes,  Wounds: Please see nursing documentation          Condition at discharge: stable  The results of significant diagnostics from this hospitalization (including imaging, microbiology, ancillary and laboratory) are listed  below for reference.   Imaging Studies: DG Chest 2 View  Result Date: 07/19/2021 CLINICAL DATA:  Chest pain with ambulation for 2 days, shortness of breath, headache, back pain EXAM: CHEST - 2 VIEW COMPARISON:  03/24/2021 FINDINGS: Frontal and lateral views of the chest demonstrate stable postsurgical changes from median sternotomy. 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 Mild enlargement the cardiac silhouette. Chronic central vascular prominence without superimposed airspace disease, effusion, or pneumothorax. No acute bony abnormalities. IMPRESSION: 1. Stable chest, no acute process. Electronically Signed   By: Randa Ngo M.D.   On: 07/19/2021 15:45   ECHOCARDIOGRAM COMPLETE  Result Date: 07/20/2021    ECHOCARDIOGRAM REPORT   Patient Name:   KIONDRE GRENZ Date of Exam: 07/20/2021 Medical Rec #:  [id number]    Height:       71.0 in Accession #:    [id number]   Weight:       493.4 lb Date of Birth:  1971-04-15   BSA:          3.095 m Patient Age:    41 years     BP:           98/66 mmHg Patient Gender: M            HR:           74 bpm. Exam Location:  [facility] Procedure: 2D Echo, Cardiac Doppler, Color Doppler and Intracardiac            Opacification Agent Indications:    Chest Pain  History:  Patient has prior history of Echocardiogram examinations, most                 recent 07/28/2020. CHF, Pacemaker, COPD, Arrythmias:Atrial                 Fibrillation, Signs/Symptoms:Shortness of Breath and Chest Pain;                 Risk Factors:Hypertension, Diabetes, Dyslipidemia and Former                 Smoker. Aortic Valve replacement with 57m Edwards Inspiris                 stented Bovine. S/P Ascending Aortic replacement.  Sonographer:    DWenda LowReferring Phys: [id number] GuildBRANCH  Sonographer Comments: Technically difficult study due to poor echo windows and patient is morbidly obese. IMPRESSIONS  1. Left ventricular ejection fraction, by estimation, is 60 to 65%. The left ventricle has  normal function. Left ventricular endocardial border not optimally defined to evaluate regional wall motion. There is moderate concentric left ventricular hypertrophy. Left ventricular diastolic parameters are consistent with Grade II diastolic dysfunction (pseudonormalization). Elevated left atrial pressure.  2. Right ventricular systolic function is normal. The right ventricular size is mildly enlarged. Tricuspid regurgitation signal is inadequate for assessing PA pressure.  3. Right atrial size was mildly dilated.  4. The mitral valve was not well visualized. No evidence of mitral valve regurgitation.  5. The aortic valve has been replaced by a 25 mm Edwards valve Aortic valve regurgitation is not visualized. Mean gradient 18 mm Hg. Only one Doppler tracing of AS showed increased acceleration time. 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  6. Aortic root/ascending aorta has been repaired/replaced. Comparison(s): RV function has improved. Stable prosthetic aortic parameters. FINDINGS  Left Ventricle: Left ventricular ejection fraction, by estimation, is 60 to 65%. The left ventricle has normal function. Left ventricular endocardial border not optimally defined to evaluate regional wall motion. Definity contrast agent was given IV to delineate the left ventricular endocardial borders. The left ventricular internal cavity size was normal in size. There is moderate concentric left ventricular hypertrophy. Left ventricular diastolic parameters are consistent with Grade II diastolic dysfunction (pseudonormalization). Elevated left atrial pressure. Right Ventricle: The right ventricular size is mildly enlarged. No increase in right ventricular wall thickness. Right ventricular systolic function is normal. Tricuspid regurgitation signal is inadequate for assessing PA pressure. Left Atrium: Left atrial size was normal in size. Right Atrium: Right atrial size was mildly dilated. Pericardium: Trivial pericardial effusion is present. Mitral Valve: MVA  1.73 cms by VTI. The mitral valve was not well visualized. No evidence of mitral valve regurgitation. MV peak gradient, 10.9 mmHg. The mean mitral valve gradient is 3.0 mmHg with average heart rate of 74 bpm. Tricuspid Valve: The tricuspid valve is not well visualized. Tricuspid valve regurgitation is not demonstrated. No evidence of tricuspid stenosis. Aortic Valve: The aortic valve has been repaired/replaced. Aortic valve regurgitation is not visualized. Aortic valve mean gradient measures 18.0 mmHg. Aortic valve peak gradient measures 35.9 mmHg. Aortic valve area, by VTI measures 0.99 cm. Pulmonic Valve: The pulmonic valve was not well visualized. Pulmonic valve regurgitation is not visualized. No evidence of pulmonic stenosis. Aorta: The aortic root and ascending aorta are structurally normal, with no evidence of dilitation and the aortic root/ascending aorta has been repaired/replaced. IAS/Shunts: No atrial level shunt detected by color flow Doppler. Additional Comments: A device lead is visualized.  LEFT VENTRICLE PLAX 2D  LVIDd:         5.90 cm   Diastology LVIDs:         3.50 cm   LV e' medial:    7.62 cm/s LV PW:         1.30 cm   LV E/e' medial:  15.0 LV IVS:        1.40 cm   LV e' lateral:   8.05 cm/s LVOT diam:     2.00 cm   LV E/e' lateral: 14.2 LV SV:         70 LV SV Index:   23 LVOT Area:     3.14 cm  RIGHT VENTRICLE RV Basal diam:  4.40 cm RV Mid diam:    3.90 cm LEFT ATRIUM           Index        RIGHT ATRIUM           Index LA diam:      4.80 cm 1.55 cm/m   RA Area:     28.10 cm LA Vol (A4C): 73.3 ml 23.68 ml/m  RA Volume:   106.00 ml 34.25 ml/m  AORTIC VALVE AV Area (Vmax):    1.09 cm AV Area (Vmean):   1.19 cm AV Area (VTI):     0.99 cm AV Vmax:           299.50 cm/s AV Vmean:          182.000 cm/s AV VTI:            0.708 m AV Peak Grad:      35.9 mmHg AV Mean Grad:      18.0 mmHg LVOT Vmax:         104.00 cm/s LVOT Vmean:        69.100 cm/s LVOT VTI:          0.223 m LVOT/AV VTI ratio:  0.31  AORTA Ao Root diam: 3.20 cm Ao Asc diam:  3.80 cm MITRAL VALVE MV Area (PHT): 3.76 cm     SHUNTS MV Area VTI:   1.74 cm     Systemic VTI:  0.22 m MV Peak grad:  10.9 mmHg    Systemic Diam: 2.00 cm MV Mean grad:  3.0 mmHg MV Vmax:       1.65 m/s MV Vmean:      79.6 cm/s MV Decel Time: 202 msec MV E velocity: 114.00 cm/s MV A velocity: 73.30 cm/s MV E/A ratio:  1.56 Rudean Haskell MD Electronically signed by Rudean Haskell MD Signature Date/Time: 07/20/2021/2:59:57 PM    Final    CT CHEST ABDOMEN PELVIS WO CONTRAST  Result Date: 07/19/2021 CLINICAL DATA:  Aortic aneurysm, known or suspected. Pt c/o chest pain, SOB and dyspnea upon exertion. Also c/o diarrhea and nausea EXAM: CT CHEST, ABDOMEN AND PELVIS WITHOUT CONTRAST TECHNIQUE: Multidetector CT imaging of the chest, abdomen and pelvis was performed following the standard protocol without IV contrast. RADIATION DOSE REDUCTION: This exam was performed according to the departmental dose-optimization program which includes automated exposure control, adjustment of the mA and/or kV according to patient size and/or use of iterative reconstruction technique. COMPARISON:  CT renal 03/24/2021 FINDINGS: CT CHEST FINDINGS Cardiovascular: Normal heart size. No significant pericardial effusion. The thoracic aorta is normal in caliber. At least mild atherosclerotic plaque of the thoracic aorta. At least 2 vessel coronary artery calcifications. Status post aortic valve replacement. Mediastinum/Nodes: No gross hilar adenopathy, noting limited sensitivity for the detection of hilar  adenopathy on this noncontrast study. A borderline enlarged 1 cm right paratracheal lymph node (2:18). No enlarged or axillary lymph nodes. Thyroid gland, trachea, and esophagus demonstrate no significant findings. Lungs/Pleura: No focal consolidation. No pulmonary nodule. No pulmonary mass. No pleural effusion. No pneumothorax. Musculoskeletal: Left chest wall dual lead cardiac  pacemaker in grossly appropriate position. Marked bilateral gynecomastia. No chest wall mass or suspicious bone lesions identified. CT ABDOMEN PELVIS FINDINGS Hepatobiliary: The liver is enlarged measuring up to 20 8 cm. No focal liver abnormality. Gallbladder is contracted. No gallstones, gallbladder wall thickening, or pericholecystic fluid. No biliary dilatation. Pancreas: No focal lesion. Normal pancreatic contour. No surrounding inflammatory changes. No main pancreatic ductal dilatation. Spleen: Enlarged measuring up to 15 cm. Evaluation for focal density. Adrenals/Urinary Tract: No adrenal nodule bilaterally. No nephrolithiasis and no hydronephrosis. No definite contour-deforming renal mass. No ureterolithiasis or hydroureter. The urinary bladder is unremarkable. Stomach/Bowel: Stomach is within normal limits. No evidence of bowel wall thickening or dilatation. Appendix appears normal. Vascular/Lymphatic: No abdominal aorta or iliac aneurysm. Mild atherosclerotic plaque of the aorta and its branches. No abdominal, pelvic, or inguinal lymphadenopathy. Reproductive: Prostate is unremarkable. Other: No intraperitoneal free fluid. No intraperitoneal free gas. No organized fluid collection. Musculoskeletal: No abdominal wall hernia or abnormality. No suspicious lytic or blastic osseous lesions. No acute displaced fracture. Multilevel degenerative changes of the spine with intervertebral disc space vacuum phenomenon the L2-L3 and L3-L4 levels. IMPRESSION: 1. Hepatosplenomegaly. Markedly limited evaluation on this noncontrast study. 2. Nonspecific borderline enlarged right paratracheal lymph node (1 cm). 3. Aortic Atherosclerosis (ICD10-I70.0). Status post aortic valve repair. 4. Marked bilateral gynecomastia. Electronically Signed   By: Iven Finn M.D.   On: 07/19/2021 19:07    Microbiology: Results for orders placed or performed during the hospital encounter of 07/19/21  Resp Panel by RT-PCR (Flu A&B,  Covid) Nasopharyngeal Swab     Status: None   Collection Time: 07/19/21  5:27 PM   Specimen: Nasopharyngeal Swab; Nasopharyngeal(NP) swabs in vial transport medium  Result Value Ref Range Status   SARS Coronavirus 2 by RT PCR NEGATIVE NEGATIVE Final    Comment: (NOTE) SARS-CoV-2 target nucleic acids are NOT DETECTED.  The SARS-CoV-2 RNA is generally detectable in upper respiratory specimens during the acute phase of infection. The lowest concentration of SARS-CoV-2 viral copies this assay can detect is 138 copies/mL. A negative result does not preclude SARS-Cov-2 infection and should not be used as the sole basis for treatment or other patient management decisions. A negative result may occur with  improper specimen collection/handling, submission of specimen other than nasopharyngeal swab, presence of viral mutation(s) within the areas targeted by this assay, and inadequate number of viral copies(<138 copies/mL). A negative result must be combined with clinical observations, patient history, and epidemiological information. The expected result is Negative.  Fact Sheet for Patients:  EntrepreneurPulse.com.au  Fact Sheet for Healthcare Providers:  IncredibleEmployment.be  This test is no t yet approved or cleared by the [place] FDA and  has been authorized for detection and/or diagnosis of SARS-CoV-2 by FDA under an Emergency Use Authorization (EUA). This EUA will remain  in effect (meaning this test can be used) for the duration of the COVID-19 declaration under Section 564(b)(1) of the Act, 21 U.S.C.section 360bbb-3(b)(1), unless the authorization is terminated  or revoked sooner.       Influenza A by PCR NEGATIVE NEGATIVE Final   Influenza B by PCR NEGATIVE NEGATIVE Final    Comment: (NOTE) The Xpert Xpress  SARS-CoV-2/FLU/RSV plus assay is intended as an aid in the diagnosis of influenza from Nasopharyngeal swab specimens and should  not be used as a sole basis for treatment. Nasal washings and aspirates are unacceptable for Xpert Xpress SARS-CoV-2/FLU/RSV testing.  Fact Sheet for Patients: EntrepreneurPulse.com.au  Fact Sheet for Healthcare Providers: IncredibleEmployment.be  This test is not yet approved or cleared by the [place] FDA and has been authorized for detection and/or diagnosis of SARS-CoV-2 by FDA under an Emergency Use Authorization (EUA). This EUA will remain in effect (meaning this test can be used) for the duration of the COVID-19 declaration under Section 564(b)(1) of the Act, 21 U.S.C. section 360bbb-3(b)(1), unless the authorization is terminated or revoked.  Performed at [facility], [address] [place], [place] [address]   Blood culture (routine x 2)     Status: None (Preliminary result)   Collection Time: 07/19/21  6:15 PM   Specimen: BLOOD LEFT HAND  Result Value Ref Range Status   Specimen Description BLOOD LEFT HAND  Final   Special Requests   Final    BOTTLES DRAWN AEROBIC AND ANAEROBIC Blood Culture adequate volume   Culture   Final    NO GROWTH 2 DAYS Performed at [facility], [address], [place], [place] [address]    Report Status PENDING  Incomplete  Blood culture (routine x 2)     Status: None (Preliminary result)   Collection Time: 07/19/21  6:15 PM   Specimen: Right Antecubital; Blood  Result Value Ref Range Status   Specimen Description RIGHT ANTECUBITAL  Final   Special Requests   Final    BOTTLES DRAWN AEROBIC AND ANAEROBIC Blood Culture results may not be optimal due to an excessive volume of blood received in culture bottles   Culture   Final    NO GROWTH 2 DAYS Performed at [facility], [address] [place], [place] [address]    Report Status PENDING  Incomplete    Labs: CBC: Recent Labs  Lab 07/19/21 1602 07/20/21 0540 07/21/21 0551  WBC 11.7* 8.3 6.0  HGB 9.0* 8.3* 8.4*  HCT 29.7* 28.1* 27.3*  MCV  81.4 82.4 82.5  PLT 314 275 790   Basic Metabolic Panel: Recent Labs  Lab 07/19/21 1602 07/20/21 0540 07/21/21 0551  NA 135 135 136  K 4.2 3.3* 3.8  CL 98 99 101  CO2 '22 23 25  ' GLUCOSE 106* 139* 170*  BUN 81* 74* 47*  CREATININE 4.07* 2.75* 1.51*  CALCIUM 8.6* 8.6* 8.8*   Liver Function Tests: Recent Labs  Lab 07/19/21 1815  AST 36  ALT 19  ALKPHOS 71  BILITOT 0.7  PROT 8.2*  ALBUMIN 3.7   CBG: Recent Labs  Lab 07/20/21 1147 07/20/21 1615 07/20/21 2025 07/21/21 0723 07/21/21 1111  GLUCAP 86 136* 115* 122* 127*    Discharge time spent: greater than 30 minutes.  Signed: Deatra James, MD [facility] 07/21/2021

## 2021-07-21 NOTE — Progress Notes (Signed)
Progress Note  Patient Name: Reginald [name] Date of Encounter: 07/21/2021  Primary Cardiologist: Jenkins Rouge, MD   Subjective   Patient has had a rough night.  He notes that he has had no chest pain but feels week and feels he needs blood.  He notes concern that we are stopping his diuretic and notes he may develop worsening swelling.  Inpatient Medications    Scheduled Meds:  allopurinol  300 mg Oral Daily   atorvastatin  40 mg Oral Daily   docusate sodium  100 mg Oral Daily   gabapentin  200 mg Oral QHS   insulin aspart  0-6 Units Subcutaneous TID WC   pantoprazole  40 mg Oral BID   tamsulosin  0.4 mg Oral Daily   umeclidinium bromide  1 puff Inhalation Daily   Continuous Infusions:  PRN Meds: acetaminophen **OR** acetaminophen, albuterol, hydrOXYzine   Vital Signs    Vitals:   07/21/21 0411 07/21/21 0444 07/21/21 0738 07/21/21 0813  BP: (!) 147/75   119/70  Pulse: 86   78  Resp: 18     Temp: (!) 97.4 F (36.3 C)     TempSrc: Oral     SpO2: 100%  97% 97%  Weight:  (!) 224 kg    Height:        Intake/Output Summary (Last 24 hours) at 07/21/2021 1014 Last data filed at 07/21/2021 0818 Gross per 24 hour  Intake 1643.31 ml  Output 5800 ml  Net -4156.69 ml   Filed Weights   07/20/21 0228 07/21/21 0444  Weight: (!) 223.8 kg (!) 224 kg    Telemetry    SR- SR 1st HB - Personally Reviewed  Physical Exam   Gen: no distress, Super morbid Obesity   Neck: No JVD but difficult exam Cardiac: No Rubs or Gallops, no Murmur, RRR +2 radial pulses Respiratory: Clear to auscultation bilaterally, normal effort, normal  respiratory rate GI: Soft, nontender, non-distended  MS: No  edema;  moves all extremities Integument: Skin feels warm Neuro:  At time of evaluation, alert and oriented to person/place/time/situation  Psych: agitated affect, patient feels poorly   Labs    Chemistry Recent Labs  Lab 07/19/21 1602 07/19/21 1815 07/20/21 0540 07/21/21 0551  NA 135   --  135 136  K 4.2  --  3.3* 3.8  CL 98  --  99 101  CO2 22  --  23 25  GLUCOSE 106*  --  139* 170*  BUN 81*  --  74* 47*  CREATININE 4.07*  --  2.75* 1.51*  CALCIUM 8.6*  --  8.6* 8.8*  PROT  --  8.2*  --   --   ALBUMIN  --  3.7  --   --   AST  --  36  --   --   ALT  --  19  --   --   ALKPHOS  --  71  --   --   BILITOT  --  0.7  --   --   GFRNONAA 17*  --  27* 56*  ANIONGAP 15  --  13 10     Hematology Recent Labs  Lab 07/19/21 1602 07/20/21 0540 07/21/21 0551  WBC 11.7* 8.3 6.0  RBC 3.65* 3.41* 3.31*  HGB 9.0* 8.3* 8.4*  HCT 29.7* 28.1* 27.3*  MCV 81.4 82.4 82.5  MCH 24.7* 24.3* 25.4*  MCHC 30.3 29.5* 30.8  RDW 17.9* 17.9* 17.4*  PLT 314 275 247  Cardiac EnzymesNo results for input(s): TROPONINI in the last 168 hours. No results for input(s): TROPIPOC in the last 168 hours.   BNPNo results for input(s): BNP, PROBNP in the last 168 hours.   DDimer  Recent Labs  Lab 07/19/21 1602  DDIMER 0.31     Radiology    DG Chest 2 View  Result Date: 07/19/2021 CLINICAL DATA:  Chest pain with ambulation for 2 days, shortness of breath, headache, back pain EXAM: CHEST - 2 VIEW COMPARISON:  03/24/2021 FINDINGS: Frontal and lateral views of the chest demonstrate stable postsurgical changes from median sternotomy. 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 Mild enlargement the cardiac silhouette. Chronic central vascular prominence without superimposed airspace disease, effusion, or pneumothorax. No acute bony abnormalities. IMPRESSION: 1. Stable chest, no acute process. Electronically Signed   By: Randa Ngo M.D.   On: 07/19/2021 15:45   ECHOCARDIOGRAM COMPLETE  Result Date: 07/20/2021    ECHOCARDIOGRAM REPORT   Patient Name:   Reginald [name] Date of Exam: 07/20/2021 Medical Rec #:  [id number]    Height:       71.0 in Accession #:    [id number]   Weight:       493.4 lb Date of Birth:  02/11/1971   BSA:          3.095 m Patient Age:    54 years     BP:           98/66 mmHg Patient  Gender: M            HR:           74 bpm. Exam Location:  [facility] Procedure: 2D Echo, Cardiac Doppler, Color Doppler and Intracardiac            Opacification Agent Indications:    Chest Pain  History:        Patient has prior history of Echocardiogram examinations, most                 recent 07/28/2020. CHF, Pacemaker, COPD, Arrythmias:Atrial                 Fibrillation, Signs/Symptoms:Shortness of Breath and Chest Pain;                 Risk Factors:Hypertension, Diabetes, Dyslipidemia and Former                 Smoker. Aortic Valve replacement with 64mm Edwards Inspiris                 stented Bovine. S/P Ascending Aortic replacement.  Sonographer:    Wenda Low Referring Phys: [id number] Alphonse Guild BRANCH  Sonographer Comments: Technically difficult study due to poor echo windows and patient is morbidly obese. IMPRESSIONS  1. Left ventricular ejection fraction, by estimation, is 60 to 65%. The left ventricle has normal function. Left ventricular endocardial border not optimally defined to evaluate regional wall motion. There is moderate concentric left ventricular hypertrophy. Left ventricular diastolic parameters are consistent with Grade II diastolic dysfunction (pseudonormalization). Elevated left atrial pressure.  2. Right ventricular systolic function is normal. The right ventricular size is mildly enlarged. Tricuspid regurgitation signal is inadequate for assessing PA pressure.  3. Right atrial size was mildly dilated.  4. The mitral valve was not well visualized. No evidence of mitral valve regurgitation.  5. The aortic valve has been replaced by a 25 mm Edwards valve Aortic valve regurgitation is not visualized. Mean gradient 18 mm Hg. Only one  Doppler tracing of AS showed increased acceleration time. 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  6. Aortic root/ascending aorta has been repaired/replaced. Comparison(s): RV function has improved. Stable prosthetic aortic parameters. FINDINGS  Left Ventricle: Left ventricular  ejection fraction, by estimation, is 60 to 65%. The left ventricle has normal function. Left ventricular endocardial border not optimally defined to evaluate regional wall motion. Definity contrast agent was given IV to delineate the left ventricular endocardial borders. The left ventricular internal cavity size was normal in size. There is moderate concentric left ventricular hypertrophy. Left ventricular diastolic parameters are consistent with Grade II diastolic dysfunction (pseudonormalization). Elevated left atrial pressure. Right Ventricle: The right ventricular size is mildly enlarged. No increase in right ventricular wall thickness. Right ventricular systolic function is normal. Tricuspid regurgitation signal is inadequate for assessing PA pressure. Left Atrium: Left atrial size was normal in size. Right Atrium: Right atrial size was mildly dilated. Pericardium: Trivial pericardial effusion is present. Mitral Valve: MVA 1.73 cms by VTI. The mitral valve was not well visualized. No evidence of mitral valve regurgitation. MV peak gradient, 10.9 mmHg. The mean mitral valve gradient is 3.0 mmHg with average heart rate of 74 bpm. Tricuspid Valve: The tricuspid valve is not well visualized. Tricuspid valve regurgitation is not demonstrated. No evidence of tricuspid stenosis. Aortic Valve: The aortic valve has been repaired/replaced. Aortic valve regurgitation is not visualized. Aortic valve mean gradient measures 18.0 mmHg. Aortic valve peak gradient measures 35.9 mmHg. Aortic valve area, by VTI measures 0.99 cm. Pulmonic Valve: The pulmonic valve was not well visualized. Pulmonic valve regurgitation is not visualized. No evidence of pulmonic stenosis. Aorta: The aortic root and ascending aorta are structurally normal, with no evidence of dilitation and the aortic root/ascending aorta has been repaired/replaced. IAS/Shunts: No atrial level shunt detected by color flow Doppler. Additional Comments: A device lead  is visualized.  LEFT VENTRICLE PLAX 2D LVIDd:         5.90 cm   Diastology LVIDs:         3.50 cm   LV e' medial:    7.62 cm/s LV PW:         1.30 cm   LV E/e' medial:  15.0 LV IVS:        1.40 cm   LV e' lateral:   8.05 cm/s LVOT diam:     2.00 cm   LV E/e' lateral: 14.2 LV SV:         70 LV SV Index:   23 LVOT Area:     3.14 cm  RIGHT VENTRICLE RV Basal diam:  4.40 cm RV Mid diam:    3.90 cm LEFT ATRIUM           Index        RIGHT ATRIUM           Index LA diam:      4.80 cm 1.55 cm/m   RA Area:     28.10 cm LA Vol (A4C): 73.3 ml 23.68 ml/m  RA Volume:   106.00 ml 34.25 ml/m  AORTIC VALVE AV Area (Vmax):    1.09 cm AV Area (Vmean):   1.19 cm AV Area (VTI):     0.99 cm AV Vmax:           299.50 cm/s AV Vmean:          182.000 cm/s AV VTI:            0.708 m AV Peak Grad:      35.9  mmHg AV Mean Grad:      18.0 mmHg LVOT Vmax:         104.00 cm/s LVOT Vmean:        69.100 cm/s LVOT VTI:          0.223 m LVOT/AV VTI ratio: 0.31  AORTA Ao Root diam: 3.20 cm Ao Asc diam:  3.80 cm MITRAL VALVE MV Area (PHT): 3.76 cm     SHUNTS MV Area VTI:   1.74 cm     Systemic VTI:  0.22 m MV Peak grad:  10.9 mmHg    Systemic Diam: 2.00 cm MV Mean grad:  3.0 mmHg MV Vmax:       1.65 m/s MV Vmean:      79.6 cm/s MV Decel Time: 202 msec MV E velocity: 114.00 cm/s MV A velocity: 73.30 cm/s MV E/A ratio:  1.56 Rudean Haskell MD Electronically signed by Rudean Haskell MD Signature Date/Time: 07/20/2021/2:59:57 PM    Final    CT CHEST ABDOMEN PELVIS WO CONTRAST  Result Date: 07/19/2021 CLINICAL DATA:  Aortic aneurysm, known or suspected. Pt c/o chest pain, SOB and dyspnea upon exertion. Also c/o diarrhea and nausea EXAM: CT CHEST, ABDOMEN AND PELVIS WITHOUT CONTRAST TECHNIQUE: Multidetector CT imaging of the chest, abdomen and pelvis was performed following the standard protocol without IV contrast. RADIATION DOSE REDUCTION: This exam was performed according to the departmental dose-optimization program which  includes automated exposure control, adjustment of the mA and/or kV according to patient size and/or use of iterative reconstruction technique. COMPARISON:  CT renal 03/24/2021 FINDINGS: CT CHEST FINDINGS Cardiovascular: Normal heart size. No significant pericardial effusion. The thoracic aorta is normal in caliber. At least mild atherosclerotic plaque of the thoracic aorta. At least 2 vessel coronary artery calcifications. Status post aortic valve replacement. Mediastinum/Nodes: No gross hilar adenopathy, noting limited sensitivity for the detection of hilar adenopathy on this noncontrast study. A borderline enlarged 1 cm right paratracheal lymph node (2:18). No enlarged or axillary lymph nodes. Thyroid gland, trachea, and esophagus demonstrate no significant findings. Lungs/Pleura: No focal consolidation. No pulmonary nodule. No pulmonary mass. No pleural effusion. No pneumothorax. Musculoskeletal: Left chest wall dual lead cardiac pacemaker in grossly appropriate position. Marked bilateral gynecomastia. No chest wall mass or suspicious bone lesions identified. CT ABDOMEN PELVIS FINDINGS Hepatobiliary: The liver is enlarged measuring up to 20 8 cm. No focal liver abnormality. Gallbladder is contracted. No gallstones, gallbladder wall thickening, or pericholecystic fluid. No biliary dilatation. Pancreas: No focal lesion. Normal pancreatic contour. No surrounding inflammatory changes. No main pancreatic ductal dilatation. Spleen: Enlarged measuring up to 15 cm. Evaluation for focal density. Adrenals/Urinary Tract: No adrenal nodule bilaterally. No nephrolithiasis and no hydronephrosis. No definite contour-deforming renal mass. No ureterolithiasis or hydroureter. The urinary bladder is unremarkable. Stomach/Bowel: Stomach is within normal limits. No evidence of bowel wall thickening or dilatation. Appendix appears normal. Vascular/Lymphatic: No abdominal aorta or iliac aneurysm. Mild atherosclerotic plaque of the  aorta and its branches. No abdominal, pelvic, or inguinal lymphadenopathy. Reproductive: Prostate is unremarkable. Other: No intraperitoneal free fluid. No intraperitoneal free gas. No organized fluid collection. Musculoskeletal: No abdominal wall hernia or abnormality. No suspicious lytic or blastic osseous lesions. No acute displaced fracture. Multilevel degenerative changes of the spine with intervertebral disc space vacuum phenomenon the L2-L3 and L3-L4 levels. IMPRESSION: 1. Hepatosplenomegaly. Markedly limited evaluation on this noncontrast study. 2. Nonspecific borderline enlarged right paratracheal lymph node (1 cm). 3. Aortic Atherosclerosis (ICD10-I70.0). Status post aortic valve repair. 4. Marked bilateral  gynecomastia. Electronically Signed   By: Iven Finn M.D.   On: 07/19/2021 19:07     Patient Profile     51 y.o. male prior AVR PAF prior PE and PPM  for complete HB who presents with CP and hypotension.  Assessment & Plan    Severe AS s/p AVR and TAA repair Widely patent CORS Chronic HFpEF not talking his SGLT2i  St. PP 2019 suspect MRI conditional Morbid Obesity Hx of PE Htn with DM OSA on CPAP PAF CHADSVASC 3 AOCD AKI on CKD - AKI has improved with hydration; needs outpatient BMP in 1-2 weeks and hold of torsemide DC.  If he notes worsening swelling he knows to restart his torsemide and let [facility] know; needs outpatient nephrology; he is not taking is prescribed SGLT2i - currently in SR; GI 3/1 suspect hydration related hgb drop; when ok be GI can restart DOAC - non cardiac CP - continue atorvastatin - lisinopril can be restarted as outpatient; came in hypotension with AKI; recommened outpatient ambulatory BP monitoring - has had one elevated BP; if persistently elevated would add back his metoprolol 100 mg PO BID next - we are working on arranging outpatient f/u   For questions or updates, please contact Edgar Please consult [email or web address] for contact info  under Cardiology/STEMI.      Signed, Werner Lean, MD  07/21/2021, 10:14 AM

## 2021-07-21 NOTE — Progress Notes (Signed)
Subjective:    States he did not rest well. Feels tired. Wonders why he cannot have more blood. No BM since in the ED. States he passed a small blood clot from his nose. Denies abdominal pain.  Objective:   Vital signs in last 24 hours: Temp:  [97.4 F (36.3 C)-98.2 F (36.8 C)] 97.4 F (36.3 C) (03/02 0411) Pulse Rate:  [75-86] 78 (03/02 0813) Resp:  [18-22] 18 (03/02 0411) BP: (90-147)/(32-75) 119/70 (03/02 0813) SpO2:  [97 %-100 %] 97 % (03/02 0813) Weight:  [224 kg] 224 kg (03/02 0444) Last BM Date : 07/18/21 General:   Alert,  obese male in NAD. Pleasant and cooperative. Head:  Normocephalic and atraumatic. Eyes:  Sclera clear, no icterus.   Abdomen:  obese Neurologic:  Alert and  oriented x4;  grossly normal neurologically. Psych:  Alert and cooperative. Normal mood and affect.  Intake/Output from previous day: 03/01 0701 - 03/02 0700 In: 1643.3 [P.O.:480; I.V.:1163.3] Out: 6000 [Urine:6000] Intake/Output this shift: Total I/O In: -  Out: 400 [Urine:400]  Lab Results: CBC Recent Labs    07/19/21 1602 07/20/21 0540 07/21/21 0551  WBC 11.7* 8.3 6.0  HGB 9.0* 8.3* 8.4*  HCT 29.7* 28.1* 27.3*  MCV 81.4 82.4 82.5  PLT 314 275 247   BMET Recent Labs    07/19/21 1602 07/20/21 0540 07/21/21 0551  NA 135 135 136  K 4.2 3.3* 3.8  CL 98 99 101  CO2 22 23 25   GLUCOSE 106* 139* 170*  BUN 81* 74* 47*  CREATININE 4.07* 2.75* 1.51*  CALCIUM 8.6* 8.6* 8.8*   LFTs Recent Labs    07/19/21 1815  BILITOT 0.7  BILIDIR 0.2  IBILI 0.5  ALKPHOS 71  AST 36  ALT 19  PROT 8.2*  ALBUMIN 3.7   No results for input(s): LIPASE in the last 72 hours. PT/INR No results for input(s): LABPROT, INR in the last 72 hours.      Imaging Studies: DG Chest 2 View  Result Date: 07/19/2021 CLINICAL DATA:  Chest pain with ambulation for 2 days, shortness of breath, headache, back pain EXAM: CHEST - 2 VIEW COMPARISON:  03/24/2021 FINDINGS: Frontal and lateral views of the  chest demonstrate stable postsurgical changes from median sternotomy. 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 Mild enlargement the cardiac silhouette. Chronic central vascular prominence without superimposed airspace disease, effusion, or pneumothorax. No acute bony abnormalities. IMPRESSION: 1. Stable chest, no acute process. Electronically Signed   By: Randa Ngo M.D.   On: 07/19/2021 15:45   ECHOCARDIOGRAM COMPLETE  Result Date: 07/20/2021    ECHOCARDIOGRAM REPORT   Patient Name:   Reginald [name] Date of Exam: 07/20/2021 Medical Rec #:  [id number]    Height:       71.0 in Accession #:    [id number]   Weight:       493.4 lb Date of Birth:  1970-12-08   BSA:          3.095 m Patient Age:    51 years     BP:           98/66 mmHg Patient Gender: M            HR:           74 bpm. Exam Location:  [facility] Procedure: 2D Echo, Cardiac Doppler, Color Doppler and Intracardiac            Opacification Agent Indications:    Chest Pain  History:  Patient has prior history of Echocardiogram examinations, most                 recent 07/28/2020. CHF, Pacemaker, COPD, Arrythmias:Atrial                 Fibrillation, Signs/Symptoms:Shortness of Breath and Chest Pain;                 Risk Factors:Hypertension, Diabetes, Dyslipidemia and Former                 Smoker. Aortic Valve replacement with 103mm Edwards Inspiris                 stented Bovine. S/P Ascending Aortic replacement.  Sonographer:    Wenda Low Referring Phys: [id number] Alphonse Guild BRANCH  Sonographer Comments: Technically difficult study due to poor echo windows and patient is morbidly obese. IMPRESSIONS  1. Left ventricular ejection fraction, by estimation, is 60 to 65%. The left ventricle has normal function. Left ventricular endocardial border not optimally defined to evaluate regional wall motion. There is moderate concentric left ventricular hypertrophy. Left ventricular diastolic parameters are consistent with Grade II diastolic dysfunction  (pseudonormalization). Elevated left atrial pressure.  2. Right ventricular systolic function is normal. The right ventricular size is mildly enlarged. Tricuspid regurgitation signal is inadequate for assessing PA pressure.  3. Right atrial size was mildly dilated.  4. The mitral valve was not well visualized. No evidence of mitral valve regurgitation.  5. The aortic valve has been replaced by a 25 mm Edwards valve Aortic valve regurgitation is not visualized. Mean gradient 18 mm Hg. Only one Doppler tracing of AS showed increased acceleration time. 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  6. Aortic root/ascending aorta has been repaired/replaced. Comparison(s): RV function has improved. Stable prosthetic aortic parameters. FINDINGS  Left Ventricle: Left ventricular ejection fraction, by estimation, is 60 to 65%. The left ventricle has normal function. Left ventricular endocardial border not optimally defined to evaluate regional wall motion. Definity contrast agent was given IV to delineate the left ventricular endocardial borders. The left ventricular internal cavity size was normal in size. There is moderate concentric left ventricular hypertrophy. Left ventricular diastolic parameters are consistent with Grade II diastolic dysfunction (pseudonormalization). Elevated left atrial pressure. Right Ventricle: The right ventricular size is mildly enlarged. No increase in right ventricular wall thickness. Right ventricular systolic function is normal. Tricuspid regurgitation signal is inadequate for assessing PA pressure. Left Atrium: Left atrial size was normal in size. Right Atrium: Right atrial size was mildly dilated. Pericardium: Trivial pericardial effusion is present. Mitral Valve: MVA 1.73 cms by VTI. The mitral valve was not well visualized. No evidence of mitral valve regurgitation. MV peak gradient, 10.9 mmHg. The mean mitral valve gradient is 3.0 mmHg with average heart rate of 74 bpm. Tricuspid Valve: The tricuspid valve is not well  visualized. Tricuspid valve regurgitation is not demonstrated. No evidence of tricuspid stenosis. Aortic Valve: The aortic valve has been repaired/replaced. Aortic valve regurgitation is not visualized. Aortic valve mean gradient measures 18.0 mmHg. Aortic valve peak gradient measures 35.9 mmHg. Aortic valve area, by VTI measures 0.99 cm. Pulmonic Valve: The pulmonic valve was not well visualized. Pulmonic valve regurgitation is not visualized. No evidence of pulmonic stenosis. Aorta: The aortic root and ascending aorta are structurally normal, with no evidence of dilitation and the aortic root/ascending aorta has been repaired/replaced. IAS/Shunts: No atrial level shunt detected by color flow Doppler. Additional Comments: A device lead is visualized.  LEFT VENTRICLE PLAX 2D  LVIDd:         5.90 cm   Diastology LVIDs:         3.50 cm   LV e' medial:    7.62 cm/s LV PW:         1.30 cm   LV E/e' medial:  15.0 LV IVS:        1.40 cm   LV e' lateral:   8.05 cm/s LVOT diam:     2.00 cm   LV E/e' lateral: 14.2 LV SV:         70 LV SV Index:   23 LVOT Area:     3.14 cm  RIGHT VENTRICLE RV Basal diam:  4.40 cm RV Mid diam:    3.90 cm LEFT ATRIUM           Index        RIGHT ATRIUM           Index LA diam:      4.80 cm 1.55 cm/m   RA Area:     28.10 cm LA Vol (A4C): 73.3 ml 23.68 ml/m  RA Volume:   106.00 ml 34.25 ml/m  AORTIC VALVE AV Area (Vmax):    1.09 cm AV Area (Vmean):   1.19 cm AV Area (VTI):     0.99 cm AV Vmax:           299.50 cm/s AV Vmean:          182.000 cm/s AV VTI:            0.708 m AV Peak Grad:      35.9 mmHg AV Mean Grad:      18.0 mmHg LVOT Vmax:         104.00 cm/s LVOT Vmean:        69.100 cm/s LVOT VTI:          0.223 m LVOT/AV VTI ratio: 0.31  AORTA Ao Root diam: 3.20 cm Ao Asc diam:  3.80 cm MITRAL VALVE MV Area (PHT): 3.76 cm     SHUNTS MV Area VTI:   1.74 cm     Systemic VTI:  0.22 m MV Peak grad:  10.9 mmHg    Systemic Diam: 2.00 cm MV Mean grad:  3.0 mmHg MV Vmax:       1.65 m/s MV  Vmean:      79.6 cm/s MV Decel Time: 202 msec MV E velocity: 114.00 cm/s MV A velocity: 73.30 cm/s MV E/A ratio:  1.56 Rudean Haskell MD Electronically signed by Rudean Haskell MD Signature Date/Time: 07/20/2021/2:59:57 PM    Final    CT CHEST ABDOMEN PELVIS WO CONTRAST  Result Date: 07/19/2021 CLINICAL DATA:  Aortic aneurysm, known or suspected. Pt c/o chest pain, SOB and dyspnea upon exertion. Also c/o diarrhea and nausea EXAM: CT CHEST, ABDOMEN AND PELVIS WITHOUT CONTRAST TECHNIQUE: Multidetector CT imaging of the chest, abdomen and pelvis was performed following the standard protocol without IV contrast. RADIATION DOSE REDUCTION: This exam was performed according to the departmental dose-optimization program which includes automated exposure control, adjustment of the mA and/or kV according to patient size and/or use of iterative reconstruction technique. COMPARISON:  CT renal 03/24/2021 FINDINGS: CT CHEST FINDINGS Cardiovascular: Normal heart size. No significant pericardial effusion. The thoracic aorta is normal in caliber. At least mild atherosclerotic plaque of the thoracic aorta. At least 2 vessel coronary artery calcifications. Status post aortic valve replacement. Mediastinum/Nodes: No gross hilar adenopathy, noting limited sensitivity for the detection of hilar  adenopathy on this noncontrast study. A borderline enlarged 1 cm right paratracheal lymph node (2:18). No enlarged or axillary lymph nodes. Thyroid gland, trachea, and esophagus demonstrate no significant findings. Lungs/Pleura: No focal consolidation. No pulmonary nodule. No pulmonary mass. No pleural effusion. No pneumothorax. Musculoskeletal: Left chest wall dual lead cardiac pacemaker in grossly appropriate position. Marked bilateral gynecomastia. No chest wall mass or suspicious bone lesions identified. CT ABDOMEN PELVIS FINDINGS Hepatobiliary: The liver is enlarged measuring up to 20 8 cm. No focal liver abnormality.  Gallbladder is contracted. No gallstones, gallbladder wall thickening, or pericholecystic fluid. No biliary dilatation. Pancreas: No focal lesion. Normal pancreatic contour. No surrounding inflammatory changes. No main pancreatic ductal dilatation. Spleen: Enlarged measuring up to 15 cm. Evaluation for focal density. Adrenals/Urinary Tract: No adrenal nodule bilaterally. No nephrolithiasis and no hydronephrosis. No definite contour-deforming renal mass. No ureterolithiasis or hydroureter. The urinary bladder is unremarkable. Stomach/Bowel: Stomach is within normal limits. No evidence of bowel wall thickening or dilatation. Appendix appears normal. Vascular/Lymphatic: No abdominal aorta or iliac aneurysm. Mild atherosclerotic plaque of the aorta and its branches. No abdominal, pelvic, or inguinal lymphadenopathy. Reproductive: Prostate is unremarkable. Other: No intraperitoneal free fluid. No intraperitoneal free gas. No organized fluid collection. Musculoskeletal: No abdominal wall hernia or abnormality. No suspicious lytic or blastic osseous lesions. No acute displaced fracture. Multilevel degenerative changes of the spine with intervertebral disc space vacuum phenomenon the L2-L3 and L3-L4 levels. IMPRESSION: 1. Hepatosplenomegaly. Markedly limited evaluation on this noncontrast study. 2. Nonspecific borderline enlarged right paratracheal lymph node (1 cm). 3. Aortic Atherosclerosis (ICD10-I70.0). Status post aortic valve repair. 4. Marked bilateral gynecomastia. Electronically Signed   By: Iven Finn M.D.   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  Hemoglobin noted to be 9, heme positive stool.  GI consulted for further evaluation.  Chronic anemia with melena: On Xarelto as an outpatient, currently held.  Patient describes intermittent melena.no current overt GI bleeding. Fluctuating hemoglobin since June 2022.  He had a EGD and colonoscopy at that time with internal and external hemorrhoids only.  Procedures had to be done elsewhere due to severe obesity and multiple comorbidities.  Givens capsule completed February 2023 for intermittent melena.  No active bleeding seen, there was nodularity noted in the mid small bowel of unclear etiology.  Presented with worsening fatigue.  Recent nose surgery few weeks back, continues to have some bleeding from this.  Hemoglobin was 10.2 on February 14, on presentation was 9, 8.3 yesterday, 8.4 today (suspected drop due to IV hydration).  Initially BUN 81, creatinine 4.07.  Today BUN is 47 and creatinine 1.51.    Plan:   Await referral to [facility] for double-balloon enteroscopy to further evaluate focal nodularity in the mid small bowel. Consider EGD if overt GI bleeding or hemoglobin continues to trend downward, requiring transfusion, although would need to be done center with higher level of care due to his severe obesity and multiple comorbidities. Hold Xarelto. Continue PPI twice daily. Monitor for overt GI bleeding.  Transfuse if hemoglobin drops below 7 g.   LOS: 2  days   Laureen Ochs. Bernarda Caffey [facility] [phone] 3/2/20239:39 AM

## 2021-07-21 NOTE — Telephone Encounter (Signed)
Spoke with pt regarding medications from discharge was instructed to stop taking three on discharge paperwork and take torsemide two times daily for 30 days with verbal understanding ?

## 2021-07-21 NOTE — Plan of Care (Signed)

## 2021-07-21 NOTE — TOC Transition Note (Signed)
Transition of Care (TOC) - CM/SW Discharge Note ? ? ?Patient Details  ?Name: Reginald [name] ?MRN: [id number] ?Date of Birth: 1970/10/21 ? ?Transition of Care (TOC) CM/SW Contact:  ?Iona Beard, LCSWA ?Phone Number: ?07/21/2021, 12:18 PM ? ? ?Clinical Narrative:    ?CSW updated Tommi Rumps with [facility] that pt will be discharging home today. MD has placed [place] orders for [facility] PT and aide services. TOC signing off.  ? ?Final next level of care: [place] ?Barriers to Discharge: Continued Medical Work up ? ? ?Patient Goals and CMS Choice ?Patient states their goals for this hospitalization and ongoing recovery are:: Home with HH ?CMS Medicare.gov Compare Post Acute Care list provided to:: Patient ?Choice offered to / list presented to : Patient ? ?Discharge Placement ?  ?           ?  ?  ?  ?  ? ?Discharge Plan and Services ?In-house Referral: Clinical Social Work ?  ?Post Acute Care Choice: Home Health          ?  ?  ?  ?  ?  ?HH Arranged: PT, Nurse's Aide ?[place] Agency: [facility] ?Date HH Agency Contacted: 07/20/21 ?Time [place]: 0840 ?Representative spoke with at [place]: Tommi Rumps ? ?Social Determinants of Health (SDOH) Interventions ?  ? ? ?Readmission Risk Interventions ?Readmission Risk Prevention Plan 07/20/2021  ?Transportation Screening Complete  ?Medication Review Press photographer) Complete  ?[place] or Home Care Consult Complete  ?SW Recovery Care/Counseling Consult Complete  ?Palliative Care Screening Not Applicable  ?[place] Not Applicable  ?Some recent data might be hidden  ? ? ? ? ? ?

## 2021-07-21 NOTE — Care Management Important Message (Signed)
Important Message ? ?Patient Details  ?Name: Reginald [name] ?MRN: [id number] ?Date of Birth: 1970/06/08 ? ? ?Medicare Important Message Given:  N/A - LOS <3 / Initial given by admissions ? ? ? ? ?Tommy Medal ?07/21/2021, 12:52 PM ?

## 2021-07-21 NOTE — Progress Notes (Signed)
Patient ID: ELAD MACPHAIL, male   DOB: 01/03/1971, 51 y.o.   MRN: [id number] S: Reports DOE but otherwise doing well. O:BP 119/70    Pulse 78    Temp (!) 97.4 F (36.3 C) (Oral)    Resp 18    Ht 5\' 11"  (1.803 m)    Wt (!) 224 kg Comment: standing scale   SpO2 97%    BMI 68.88 kg/m   Intake/Output Summary (Last 24 hours) at 07/21/2021 1114 Last data filed at 07/21/2021 0818 Gross per 24 hour  Intake 1643.31 ml  Output 5800 ml  Net -4156.69 ml   Intake/Output: I/O last 3 completed shifts: In: 2123.3 [P.O.:960; I.V.:1163.3] Out: 6500 [Urine:6500]  Intake/Output this shift:  Total I/O In: -  Out: 800 [Urine:800] Weight change: 0.2 kg Gen:NAD CVS: RRR Resp:CTA Abd: +BS, soft, NT/ND Ext: no edema  Recent Labs  Lab 07/19/21 1602 07/19/21 1815 07/20/21 0540 07/21/21 0551  NA 135  --  135 136  K 4.2  --  3.3* 3.8  CL 98  --  99 101  CO2 22  --  23 25  GLUCOSE 106*  --  139* 170*  BUN 81*  --  74* 47*  CREATININE 4.07*  --  2.75* 1.51*  ALBUMIN  --  3.7  --   --   CALCIUM 8.6*  --  8.6* 8.8*  AST  --  36  --   --   ALT  --  19  --   --    Liver Function Tests: Recent Labs  Lab 07/19/21 1815  AST 36  ALT 19  ALKPHOS 71  BILITOT 0.7  PROT 8.2*  ALBUMIN 3.7   No results for input(s): LIPASE, AMYLASE in the last 168 hours. No results for input(s): AMMONIA in the last 168 hours. CBC: Recent Labs  Lab 07/19/21 1602 07/20/21 0540 07/21/21 0551  WBC 11.7* 8.3 6.0  HGB 9.0* 8.3* 8.4*  HCT 29.7* 28.1* 27.3*  MCV 81.4 82.4 82.5  PLT 314 275 247   Cardiac Enzymes: No results for input(s): CKTOTAL, CKMB, CKMBINDEX, TROPONINI in the last 168 hours. CBG: Recent Labs  Lab 07/20/21 1147 07/20/21 1615 07/20/21 2025 07/21/21 0723 07/21/21 1111  GLUCAP 86 136* 115* 122* 127*    Iron Studies: No results for input(s): IRON, TIBC, TRANSFERRIN, FERRITIN in the last 72 hours. Studies/Results: DG Chest 2 View  Result Date: 07/19/2021 CLINICAL DATA:  Chest pain with  ambulation for 2 days, shortness of breath, headache, back pain EXAM: CHEST - 2 VIEW COMPARISON:  03/24/2021 FINDINGS: Frontal and lateral views of the chest demonstrate stable postsurgical changes from median sternotomy. 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 Mild enlargement the cardiac silhouette. Chronic central vascular prominence without superimposed airspace disease, effusion, or pneumothorax. No acute bony abnormalities. IMPRESSION: 1. Stable chest, no acute process. Electronically Signed   By: Randa Ngo M.D.   On: 07/19/2021 15:45   ECHOCARDIOGRAM COMPLETE  Result Date: 07/20/2021    ECHOCARDIOGRAM REPORT   Patient Name:   RONAN DUECKER Date of Exam: 07/20/2021 Medical Rec #:  [id number]    Height:       71.0 in Accession #:    [id number]   Weight:       493.4 lb Date of Birth:  1970-10-20   BSA:          3.095 m Patient Age:    21 years     BP:  98/66 mmHg Patient Gender: M            HR:           74 bpm. Exam Location:  [facility] Procedure: 2D Echo, Cardiac Doppler, Color Doppler and Intracardiac            Opacification Agent Indications:    Chest Pain  History:        Patient has prior history of Echocardiogram examinations, most                 recent 07/28/2020. CHF, Pacemaker, COPD, Arrythmias:Atrial                 Fibrillation, Signs/Symptoms:Shortness of Breath and Chest Pain;                 Risk Factors:Hypertension, Diabetes, Dyslipidemia and Former                 Smoker. Aortic Valve replacement with 12mm Edwards Inspiris                 stented Bovine. S/P Ascending Aortic replacement.  Sonographer:    Wenda Low Referring Phys: [id number] Alphonse Guild BRANCH  Sonographer Comments: Technically difficult study due to poor echo windows and patient is morbidly obese. IMPRESSIONS  1. Left ventricular ejection fraction, by estimation, is 60 to 65%. The left ventricle has normal function. Left ventricular endocardial border not optimally defined to evaluate regional wall motion. There is  moderate concentric left ventricular hypertrophy. Left ventricular diastolic parameters are consistent with Grade II diastolic dysfunction (pseudonormalization). Elevated left atrial pressure.  2. Right ventricular systolic function is normal. The right ventricular size is mildly enlarged. Tricuspid regurgitation signal is inadequate for assessing PA pressure.  3. Right atrial size was mildly dilated.  4. The mitral valve was not well visualized. No evidence of mitral valve regurgitation.  5. The aortic valve has been replaced by a 25 mm Edwards valve Aortic valve regurgitation is not visualized. Mean gradient 18 mm Hg. Only one Doppler tracing of AS showed increased acceleration time. 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  6. Aortic root/ascending aorta has been repaired/replaced. Comparison(s): RV function has improved. Stable prosthetic aortic parameters. FINDINGS  Left Ventricle: Left ventricular ejection fraction, by estimation, is 60 to 65%. The left ventricle has normal function. Left ventricular endocardial border not optimally defined to evaluate regional wall motion. Definity contrast agent was given IV to delineate the left ventricular endocardial borders. The left ventricular internal cavity size was normal in size. There is moderate concentric left ventricular hypertrophy. Left ventricular diastolic parameters are consistent with Grade II diastolic dysfunction (pseudonormalization). Elevated left atrial pressure. Right Ventricle: The right ventricular size is mildly enlarged. No increase in right ventricular wall thickness. Right ventricular systolic function is normal. Tricuspid regurgitation signal is inadequate for assessing PA pressure. Left Atrium: Left atrial size was normal in size. Right Atrium: Right atrial size was mildly dilated. Pericardium: Trivial pericardial effusion is present. Mitral Valve: MVA 1.73 cms by VTI. The mitral valve was not well visualized. No evidence of mitral valve regurgitation. MV peak  gradient, 10.9 mmHg. The mean mitral valve gradient is 3.0 mmHg with average heart rate of 74 bpm. Tricuspid Valve: The tricuspid valve is not well visualized. Tricuspid valve regurgitation is not demonstrated. No evidence of tricuspid stenosis. Aortic Valve: The aortic valve has been repaired/replaced. Aortic valve regurgitation is not visualized. Aortic valve mean gradient measures 18.0 mmHg. Aortic valve peak gradient measures 35.9 mmHg. Aortic valve  area, by VTI measures 0.99 cm. Pulmonic Valve: The pulmonic valve was not well visualized. Pulmonic valve regurgitation is not visualized. No evidence of pulmonic stenosis. Aorta: The aortic root and ascending aorta are structurally normal, with no evidence of dilitation and the aortic root/ascending aorta has been repaired/replaced. IAS/Shunts: No atrial level shunt detected by color flow Doppler. Additional Comments: A device lead is visualized.  LEFT VENTRICLE PLAX 2D LVIDd:         5.90 cm   Diastology LVIDs:         3.50 cm   LV e' medial:    7.62 cm/s LV PW:         1.30 cm   LV E/e' medial:  15.0 LV IVS:        1.40 cm   LV e' lateral:   8.05 cm/s LVOT diam:     2.00 cm   LV E/e' lateral: 14.2 LV SV:         70 LV SV Index:   23 LVOT Area:     3.14 cm  RIGHT VENTRICLE RV Basal diam:  4.40 cm RV Mid diam:    3.90 cm LEFT ATRIUM           Index        RIGHT ATRIUM           Index LA diam:      4.80 cm 1.55 cm/m   RA Area:     28.10 cm LA Vol (A4C): 73.3 ml 23.68 ml/m  RA Volume:   106.00 ml 34.25 ml/m  AORTIC VALVE AV Area (Vmax):    1.09 cm AV Area (Vmean):   1.19 cm AV Area (VTI):     0.99 cm AV Vmax:           299.50 cm/s AV Vmean:          182.000 cm/s AV VTI:            0.708 m AV Peak Grad:      35.9 mmHg AV Mean Grad:      18.0 mmHg LVOT Vmax:         104.00 cm/s LVOT Vmean:        69.100 cm/s LVOT VTI:          0.223 m LVOT/AV VTI ratio: 0.31  AORTA Ao Root diam: 3.20 cm Ao Asc diam:  3.80 cm MITRAL VALVE MV Area (PHT): 3.76 cm     SHUNTS MV  Area VTI:   1.74 cm     Systemic VTI:  0.22 m MV Peak grad:  10.9 mmHg    Systemic Diam: 2.00 cm MV Mean grad:  3.0 mmHg MV Vmax:       1.65 m/s MV Vmean:      79.6 cm/s MV Decel Time: 202 msec MV E velocity: 114.00 cm/s MV A velocity: 73.30 cm/s MV E/A ratio:  1.56 Rudean Haskell MD Electronically signed by Rudean Haskell MD Signature Date/Time: 07/20/2021/2:59:57 PM    Final    CT CHEST ABDOMEN PELVIS WO CONTRAST  Result Date: 07/19/2021 CLINICAL DATA:  Aortic aneurysm, known or suspected. Pt c/o chest pain, SOB and dyspnea upon exertion. Also c/o diarrhea and nausea EXAM: CT CHEST, ABDOMEN AND PELVIS WITHOUT CONTRAST TECHNIQUE: Multidetector CT imaging of the chest, abdomen and pelvis was performed following the standard protocol without IV contrast. RADIATION DOSE REDUCTION: This exam was performed according to the departmental dose-optimization program which includes automated exposure control, adjustment of the  mA and/or kV according to patient size and/or use of iterative reconstruction technique. COMPARISON:  CT renal 03/24/2021 FINDINGS: CT CHEST FINDINGS Cardiovascular: Normal heart size. No significant pericardial effusion. The thoracic aorta is normal in caliber. At least mild atherosclerotic plaque of the thoracic aorta. At least 2 vessel coronary artery calcifications. Status post aortic valve replacement. Mediastinum/Nodes: No gross hilar adenopathy, noting limited sensitivity for the detection of hilar adenopathy on this noncontrast study. A borderline enlarged 1 cm right paratracheal lymph node (2:18). No enlarged or axillary lymph nodes. Thyroid gland, trachea, and esophagus demonstrate no significant findings. Lungs/Pleura: No focal consolidation. No pulmonary nodule. No pulmonary mass. No pleural effusion. No pneumothorax. Musculoskeletal: Left chest wall dual lead cardiac pacemaker in grossly appropriate position. Marked bilateral gynecomastia. No chest wall mass or suspicious  bone lesions identified. CT ABDOMEN PELVIS FINDINGS Hepatobiliary: The liver is enlarged measuring up to 20 8 cm. No focal liver abnormality. Gallbladder is contracted. No gallstones, gallbladder wall thickening, or pericholecystic fluid. No biliary dilatation. Pancreas: No focal lesion. Normal pancreatic contour. No surrounding inflammatory changes. No main pancreatic ductal dilatation. Spleen: Enlarged measuring up to 15 cm. Evaluation for focal density. Adrenals/Urinary Tract: No adrenal nodule bilaterally. No nephrolithiasis and no hydronephrosis. No definite contour-deforming renal mass. No ureterolithiasis or hydroureter. The urinary bladder is unremarkable. Stomach/Bowel: Stomach is within normal limits. No evidence of bowel wall thickening or dilatation. Appendix appears normal. Vascular/Lymphatic: No abdominal aorta or iliac aneurysm. Mild atherosclerotic plaque of the aorta and its branches. No abdominal, pelvic, or inguinal lymphadenopathy. Reproductive: Prostate is unremarkable. Other: No intraperitoneal free fluid. No intraperitoneal free gas. No organized fluid collection. Musculoskeletal: No abdominal wall hernia or abnormality. No suspicious lytic or blastic osseous lesions. No acute displaced fracture. Multilevel degenerative changes of the spine with intervertebral disc space vacuum phenomenon the L2-L3 and L3-L4 levels. IMPRESSION: 1. Hepatosplenomegaly. Markedly limited evaluation on this noncontrast study. 2. Nonspecific borderline enlarged right paratracheal lymph node (1 cm). 3. Aortic Atherosclerosis (ICD10-I70.0). Status post aortic valve repair. 4. Marked bilateral gynecomastia. Electronically Signed   By: Iven Finn M.D.   On: 07/19/2021 19:07    allopurinol  300 mg Oral Daily   atorvastatin  40 mg Oral Daily   docusate sodium  100 mg Oral Daily   gabapentin  200 mg Oral QHS   insulin aspart  0-6 Units Subcutaneous TID WC   pantoprazole  40 mg Oral BID   tamsulosin  0.4 mg Oral  Daily   umeclidinium bromide  1 puff Inhalation Daily    BMET    Component Value Date/Time   NA 136 07/21/2021 0551   NA 143 07/05/2021 1308   K 3.8 07/21/2021 0551   CL 101 07/21/2021 0551   CO2 25 07/21/2021 0551   GLUCOSE 170 (H) 07/21/2021 0551   BUN 47 (H) 07/21/2021 0551   BUN 19 07/05/2021 1308   CREATININE 1.51 (H) 07/21/2021 0551   CREATININE 0.95 07/05/2017 1656   CALCIUM 8.8 (L) 07/21/2021 0551   GFRNONAA 56 (L) 07/21/2021 0551   GFRAA 75 06/10/2020 0837   CBC    Component Value Date/Time   WBC 6.0 07/21/2021 0551   RBC 3.31 (L) 07/21/2021 0551   HGB 8.4 (L) 07/21/2021 0551   HGB 10.2 (L) 07/05/2021 1308   HCT 27.3 (L) 07/21/2021 0551   HCT 32.2 (L) 07/05/2021 1308   PLT 247 07/21/2021 0551   PLT 262 07/05/2021 1308   MCV 82.5 07/21/2021 0551   MCV 79 07/05/2021  1308   MCH 25.4 (L) 07/21/2021 0551   MCHC 30.8 07/21/2021 0551   RDW 17.4 (H) 07/21/2021 0551   RDW 17.7 (H) 07/05/2021 1308   LYMPHSABS 2.2 07/05/2021 1308   MONOABS 1.2 (H) 03/24/2021 1431   EOSABS 0.3 07/05/2021 1308   BASOSABS 0.0 07/05/2021 1308    Assessment/Plan:  AKI/CKD stage IIIa - presumably ischemic ATN in setting of hypotension, likely hypovolemia, anemia, and concomitant ARB and SGLT2-inhibitor.  Scr has markedly improved overnight with IVF's.  CT scan without evidence of hydronephrosis and urine has bland sediment.   UOP and SCr have markedly improved with IVF's and improved BP. Continue to hold lisinopril, jardiance, and metformin until he can be reassessed as an outpatient.  Renal dose meds Avoid nephrotoxic agents (ie. IV contrast, NSAIDs, phosphate containing bowel preps) IVF's stopped yesterday. Nothing further to add. Will sign off.  Please call with questions or concerns.  Will arrange for f/u in our office in [place] in 3-4 weeks. Hypotension - improved with IVF"s. ECHO with preserved EF, Grade II DD. Chest pain - cardiology following.  P. Atrial fib - per Cardiology.   Xarelto on hold due to GIB and AKI Heme + stools - GI consulted. Anemia - h/o chronic blood loss anemia as above.  Hgb stable and transfuse prn.  Chronic diastolic chf - decrease IVF's as he now has some edema COPD/OSA - per primary DM type 2 - stable Prolonged QT interval - on tele Lactic acidosis - improved with improved BP and IVF's.  Procalcitonin normal.  Possibly due to metformin and AKI.  Continue to hold metformin.  Donetta Potts, MD [employer] [phone]

## 2021-07-22 ENCOUNTER — Telehealth: Payer: Self-pay

## 2021-07-22 LAB — BPAM RBC
Blood Product Expiration Date: 202303302359
ISSUE DATE / TIME: 202303021219
Unit Type and Rh: 600

## 2021-07-22 LAB — TYPE AND SCREEN
ABO/RH(D): A NEG
Antibody Screen: NEGATIVE
Unit division: 0

## 2021-07-22 NOTE — Telephone Encounter (Signed)
Transition Care Management Follow-up Telephone Call ?Date of discharge and from where[name]  ?How have you been since you were released from the hospital? Tired  ?Any questions or concerns? No ? ?Items Reviewed: ?Did the pt receive and understand the discharge instructions provided? Yes  ?Medications obtained and verified? Yes  ?Other?    ?Any new allergies since your discharge? No  ?Dietary orders reviewed? Yes ?Do you have support at home? No  ? ?Home Care and Equipment/Supplies: ?Were home health services ordered? yes ?If so, what is the name of the agency? [facility]   ?Has the agency set up a time to come to the patient's home? yes ?Were any new equipment or medical supplies ordered?  No ?What is the name of the medical supply agency? N/a ?Were you able to get the supplies/equipment? N/a ?Do you have any questions related to the use of the equipment or supplies? No ? ?Functional Questionnaire: (I = Independent and D = Dependent) ?ADLs: D ? ?Bathing/Dressing- D ? ?Meal Prep- D ? ?Eating- D ? ?Maintaining continence- D ? ?Transferring/Ambulation- D ? ?Managing Meds- D ? ?Follow up appointments reviewed: ? ?PCP Hospital f/u appt confirmed? Yes  Scheduled to see Posey Pronto on 07/25/21 @ 1:00. ?[facility] f/u appt confirmed? Yes  Scheduled to see Cardiology on 07/27/21 @ 1:00. ?Are transportation arrangements needed? No  ?If their condition worsens, is the pt aware to call PCP or go to the Emergency Dept.? Yes ?Was the patient provided with contact information for the PCP's office or ED? Yes ?Was to pt encouraged to call back with questions or concerns? Yes  ?

## 2021-07-25 ENCOUNTER — Telehealth: Payer: Self-pay

## 2021-07-25 ENCOUNTER — Inpatient Hospital Stay: Payer: Medicare HMO | Admitting: Internal Medicine

## 2021-07-25 LAB — CULTURE, BLOOD (ROUTINE X 2)
Culture: NO GROWTH
Culture: NO GROWTH
Special Requests: ADEQUATE

## 2021-07-25 NOTE — Telephone Encounter (Signed)
patient called asked to cancel appt 3.10.2023 would like to see Dr Posey Pronto. Does not want to see any other provider. Will wait and see Dr Posey Pronto on 08/12/21 his regular appt ?

## 2021-07-26 ENCOUNTER — Other Ambulatory Visit (HOSPITAL_COMMUNITY)
Admission: RE | Admit: 2021-07-26 | Discharge: 2021-07-26 | Disposition: A | Discharge status: Home / Self Care | Payer: Medicare HMO | Source: Ambulatory Visit | Attending: Student | Admitting: Student

## 2021-07-26 ENCOUNTER — Telehealth: Payer: Self-pay | Admitting: Internal Medicine

## 2021-07-26 ENCOUNTER — Other Ambulatory Visit: Payer: Self-pay

## 2021-07-26 DIAGNOSIS — N179 Acute kidney failure, unspecified: Secondary | ICD-10-CM | POA: Insufficient documentation

## 2021-07-26 DIAGNOSIS — Z79899 Other long term (current) drug therapy: Secondary | ICD-10-CM | POA: Insufficient documentation

## 2021-07-26 LAB — COMPREHENSIVE METABOLIC PANEL
ALT: 21 U/L (ref 0–44)
AST: 51 U/L — ABNORMAL HIGH (ref 15–41)
Albumin: 4 g/dL (ref 3.5–5.0)
Alkaline Phosphatase: 71 U/L (ref 38–126)
Anion gap: 11 (ref 5–15)
BUN: 34 mg/dL — ABNORMAL HIGH (ref 6–20)
CO2: 29 mmol/L (ref 22–32)
Calcium: 9.1 mg/dL (ref 8.9–10.3)
Chloride: 95 mmol/L — ABNORMAL LOW (ref 98–111)
Creatinine, Ser: 1.41 mg/dL — ABNORMAL HIGH (ref 0.61–1.24)
GFR, Estimated: >60 mL/min (ref >60)
Glucose, Bld: 171 mg/dL — ABNORMAL HIGH (ref 70–99)
Potassium: 3.1 mmol/L — ABNORMAL LOW (ref 3.5–5.1)
Sodium: 135 mmol/L (ref 135–145)
Total Bilirubin: 1.6 mg/dL — ABNORMAL HIGH (ref 0.3–1.2)
Total Protein: 8.6 g/dL — ABNORMAL HIGH (ref 6.5–8.1)

## 2021-07-26 LAB — CBC
HCT: 32.2 % — ABNORMAL LOW (ref 39.0–52.0)
Hemoglobin: 10 g/dL — ABNORMAL LOW (ref 13.0–17.0)
MCH: 25.7 pg — ABNORMAL LOW (ref 26.0–34.0)
MCHC: 31.1 g/dL (ref 30.0–36.0)
MCV: 82.8 fL (ref 80.0–100.0)
Platelets: 299 10*3/uL (ref 150–400)
RBC: 3.89 MIL/uL — ABNORMAL LOW (ref 4.22–5.81)
RDW: 18.6 % — ABNORMAL HIGH (ref 11.5–15.5)
WBC: 11.2 10*3/uL — ABNORMAL HIGH (ref 4.0–10.5)
nRBC: 0.4 % — ABNORMAL HIGH (ref 0.0–0.2)

## 2021-07-26 NOTE — Telephone Encounter (Signed)
Tressia Danas physical therapist with [facility], called in on patient behalf. ? ?Patient had PT evlav done and is at baseline level ? ?No further PT visits needed  ? ?Patient agrees  ? ?Patient was given an exercise program  ? ?Call back info  ? ?[place]  ?[phone] ?

## 2021-07-27 ENCOUNTER — Ambulatory Visit: Payer: Medicare HMO | Admitting: Student

## 2021-07-27 ENCOUNTER — Other Ambulatory Visit: Payer: Self-pay

## 2021-07-27 ENCOUNTER — Telehealth: Payer: Self-pay

## 2021-07-27 ENCOUNTER — Encounter: Payer: Self-pay | Admitting: Student

## 2021-07-27 VITALS — BP 124/70 | HR 80 | Ht 71.0 in | Wt >= 6400 oz

## 2021-07-27 DIAGNOSIS — I5032 Chronic diastolic (congestive) heart failure: Secondary | ICD-10-CM

## 2021-07-27 DIAGNOSIS — Z79899 Other long term (current) drug therapy: Secondary | ICD-10-CM

## 2021-07-27 DIAGNOSIS — R0789 Other chest pain: Secondary | ICD-10-CM | POA: Diagnosis not present

## 2021-07-27 DIAGNOSIS — Z953 Presence of xenogenic heart valve: Secondary | ICD-10-CM

## 2021-07-27 DIAGNOSIS — N179 Acute kidney failure, unspecified: Secondary | ICD-10-CM

## 2021-07-27 DIAGNOSIS — I442 Atrioventricular block, complete: Secondary | ICD-10-CM

## 2021-07-27 DIAGNOSIS — I480 Paroxysmal atrial fibrillation: Secondary | ICD-10-CM | POA: Diagnosis not present

## 2021-07-27 NOTE — Patient Instructions (Addendum)
Medication Instructions:  ? ?Continue current medication regimen. Will verify with GI when to restart Xarelto. Continue to hold for now.  ? ? ?*If you need a refill on your cardiac medications before your next appointment, please call your pharmacy* ? ? ?Lab Work: ? ?CBC and BMET in 7-10 days unless checked by Hematology in the interim.  ? ?If you have labs (blood work) drawn today and your tests are completely normal, you will receive your results only by: ?MyChart Message (if you have MyChart) OR ?A paper copy in the mail ?If you have any lab test that is abnormal or we need to change your treatment, we will call you to review the results. ? ? ?Testing/Procedures: ? ?None.  ? ?Follow-Up: ?At [facility], you and your health needs are our priority.  As part of our continuing mission to provide you with exceptional heart care, we have created designated Provider Care Teams.  These Care Teams include your primary Cardiologist (physician) and Advanced Practice Providers (APPs -  Physician Assistants and Nurse Practitioners) who all work together to provide you with the care you need, when you need it. ? ?We recommend signing up for the patient portal called "MyChart".  Sign up information is provided on this After Visit Summary.  MyChart is used to connect with patients for Virtual Visits (Telemedicine).  Patients are able to view lab/test results, encounter notes, upcoming appointments, etc.  Non-urgent messages can be sent to your provider as well.   ?To learn more about what you can do with MyChart, go to [email or web address].   ? ?Your next appointment:   ?3 month(s) ? ?The format for your next appointment:   ?In Person ? ?Provider:   ?You will see one of the following Advanced Practice Providers on your designated Care Team:   ?Bernerd Pho, PA-C  ?Ermalinda Barrios, PA-C  ? ? ? ?

## 2021-07-27 NOTE — Telephone Encounter (Signed)
This was sent to [facility] however pt gave [place] the wrong fax number per brandon refaxed to [phone] ?

## 2021-07-27 NOTE — Telephone Encounter (Signed)
LVM for [name] to call the office regarding this matter ?

## 2021-07-27 NOTE — Progress Notes (Signed)
° °Cardiology Office Note   ° °Date:  07/27/2021  ° °ID:  Reginald [name], DOB 05/20/1971, MRN [id number] ° °PCP:  Patel, Rutwik K, MD  °Cardiologist: Peter Nishan, MD --> Patient requests to switch to Dr. Branch °EP: Dr. Allred ° °Chief Complaint  °Patient presents with  ° Hospitalization Follow-up  ° ° °History of Present Illness:   ° °Reginald [name] is a 50 y.o. male  with past medical history of severe AS (s/p bovine tissue AVR in 08/2017 with repair of ascending thoracic aortic aneurysm), normal coronary arteries by cardiac catheterization in 2019, HFpEF, paroxysmal atrial fibrillation, CHB (s/p St. Jude PPM placement in 08/2017), history of bilateral PE, HTN, morbid obesity and OSA (on CPAP) who presents to the office today for hospital follow-up. ° °He was last examined by myself in 03/2021 and had recently been hospitalized earlier that month for severe sepsis and hematuria but had left AMA. At the time of his visit, he reported still having intermittent dyspnea on exertion and was taking Torsemide 40 mg twice daily and had not had to utilize Lasix in several weeks. No changes were made to his cardiac medications at that time. ° °In the interim, he was admitted to [facility] on 07/19/2021 for evaluation of left-sided chest pain which occurred while lifting heavy objects. Troponin values were negative but he was found to be anemic with hemoglobin down to 8.3 and had an AKI with creatinine elevated to 4.07. A repeat echocardiogram was obtained which showed a preserved EF of 60 to 65% with grade 2 diastolic dysfunction. His aortic valve was functioning normally with mean gradient of 18 mmHg. He received IV hydration with improvement in his renal function and his creatinine was at 1.51 on the day of discharge (07/21/2021). He was followed by Nephrology during admission as well and was recommended to hold Lisinopril, Jardiance and Metformin until outpatient follow-up. GI also consulted on him during admission and it was  recommended that he proceed with outpatient referral to [facility] for double-balloon enteroscopy. ° °In talking with the patient today, he reports still having dyspnea on exertion and generalized weakness. Reports having dark stools but no hematochezia. Has been holding Xarelto since hospital discharge as he was awaiting input in regards to restarting this. He is scheduled to see Hematology later this week and GI at [facility] in 08/2021. He still experiences chest pain since admission and is unaware of any specific triggers. Feels sore in his chest and this can occur at rest or with activity. He has been using his CPAP at night with no orthopnea or PND. He recently obtained a lymphedema compression machine and this has already helped with his edema.  ° ° °Past Medical History:  °Diagnosis Date  ° Anemia   ° Anxiety   ° Aortic stenosis   ° Arthritis   ° Asthma   ° Back pain   ° Cellulitis of skin with lymphangitis   ° CHF (congestive heart failure) (HCC)   ° Chronic diastolic congestive heart failure (HCC)   ° Chronic venous insufficiency   ° Constipation   ° COPD (chronic obstructive pulmonary disease) (HCC)   ° Coronary artery disease with history of myocardial infarction without history of CABG   ° Depression   ° Diabetes (HCC)   ° Dyspnea   ° Essential hypertension   ° Gout   ° Heart murmur   ° Hypertension   ° Morbid obesity (HCC)   ° Obesity   ° Pneumonia   °   walking pneumonia  ° Prostatitis   ° Pulmonary embolism (HCC)   ° Pulmonary hypertension (HCC)   ° Pyelonephritis   ° S/P aortic valve replacement with bioprosthetic valve 08/23/2017  ° 25 mm Edwards Inspiris Resilia stented bovine pericardial tissue valve  ° 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  ° 24 mm Hemashield supracoronary straight graft   ° Sleep apnea   ° cpap   ° Thoracic ascending aortic aneurysm   ° Thoracic ascending aortic aneurysm   ° Tobacco abuse   ° ° °Past Surgical History:  °Procedure Laterality Date  ° AORTIC VALVE REPLACEMENT N/A  08/23/2017  ° Procedure: AORTIC VALVE REPLACEMENT (AVR) USING INSPIRIS RESILIA AORTIC VALVE SIZE 25 MM;  Surgeon: Owen, Clarence H, MD;  [facility]: [facility];  Service: Open Heart Surgery;  Laterality: N/A;  ° CARDIAC VALVE REPLACEMENT N/A   ° Phreesia 02/07/2020  ° COLONOSCOPY WITH PROPOFOL N/A 11/14/2020  ° Procedure: COLONOSCOPY WITH PROPOFOL;  Surgeon: Gessner, Carl E, MD;  Location: [facility] END[facility];  Service: Endoscopy;  Laterality: N/A;  ° ESOPHAGOGASTRODUODENOSCOPY (EGD) WITH PROPOFOL N/A 11/14/2020  ° Procedure: ESOPHAGOGASTRODUODENOSCOPY (EGD) WITH PROPOFOL;  Surgeon: Gessner, Carl E, MD;  Location: [facility];  Service: Endoscopy;  Laterality: N/A;  ° GIVENS CAPSULE STUDY N/A 06/30/2021  ° Procedure: GIVENS CAPSULE STUDY;  Surgeon: Castaneda Mayorga, Daniel, MD;  [facility];  Service: Gastroenterology;  Laterality: N/A;  7:30  ° MULTIPLE EXTRACTIONS WITH ALVEOLOPLASTY N/A 07/23/2017  ° Procedure: Extraction of tooth #10 with alveoloplasty and gross debridement of remaining teeth;  Surgeon: Kulinski, Ronald F, DDS;  Location: [facility];  Service: Oral Surgery;  Laterality: N/A;  ° NASAL SEPTOPLASTY W/ TURBINOPLASTY Bilateral 07/01/2021  ° Procedure: NASAL SEPTOPLASTY WITH TURBINATE REDUCTION;  Surgeon: Teoh, Su, MD;  Location: [facility];  Service: ENT;  Laterality: Bilateral;  ° PACEMAKER IMPLANT N/A 08/28/2017  °  St Jude Medical Assurity MRI conditional  dual-chamber pacemaker for symptomatic complete heart blockby Dr Allred  ° TEE WITHOUT CARDIOVERSION N/A 07/16/2017  ° Procedure: TRANSESOPHAGEAL ECHOCARDIOGRAM (TEE);  Surgeon: Crenshaw, Brian S, MD;  [facility];  Service: Cardiovascular;  Laterality: N/A;  ° TEE WITHOUT CARDIOVERSION N/A 08/23/2017  ° Procedure: TRANSESOPHAGEAL ECHOCARDIOGRAM (TEE);  Surgeon: Owen, Clarence H, MD;  [facility];  Service: Open Heart Surgery;  Laterality: N/A;  ° THORACIC AORTIC ANEURYSM REPAIR N/A 08/23/2017  ° Procedure: THORACIC ASCENDING ANEURYSM REPAIR  (AAA) USING HEMASHIELD GOLD KNITTED MICROVEL DOUBLE VELOUR VASCULAR GRAFT D: 24 MM  L: 30 CM;  Surgeon: Owen, Clarence H, MD;  [facility];  Service: Open Heart Surgery;  Laterality: N/A;  ° ° °Current Medications: °Outpatient Medications Prior to Visit  °Medication Sig Dispense Refill  ° albuterol (PROVENTIL) (2.5 MG/3ML) 0.083% nebulizer solution INHALE THE CONTENTS OF 1 VIAL VIA NEBULIZER EVERY 6 HOURS AS NEEDED FOR WHEEZING OR SHORTNESS OF BREATH 270 mL 1  ° albuterol (VENTOLIN HFA) 108 (90 Base) MCG/ACT inhaler INHALE 2 PUFFS INTO THE LUNGS EVERY 6 (SIX) HOURS AS NEEDED FOR WHEEZING OR SHORTNESS OF BREATH. 1 each 1  ° allopurinol (ZYLOPRIM) 300 MG tablet TAKE 1 TABLET EVERY DAY 90 tablet 0  ° atorvastatin (LIPITOR) 40 MG tablet TAKE 1 TABLET EVERY DAY 90 tablet 3  ° Blood Glucose Monitoring Suppl (TRUE METRIX METER) w/Device KIT USE AS DIRECTED 1 kit 0  ° buPROPion (WELLBUTRIN XL) 300 MG 24 hr tablet Take 1 tablet (300 mg total) by mouth daily. 90 tablet 3  ° Cholecalciferol 125 MCG (  5000 UT) TABS Take 5,000 Units by mouth daily.    ° docusate sodium (COLACE) 100 MG capsule Take 2 capsules (200 mg total) by mouth 2 (two) times daily. (Patient taking differently: Take 200 mg by mouth daily.) 30 capsule 0  ° FEROSUL 325 (65 Fe) MG tablet TAKE 1 TABLET EVERY DAY 90 tablet 1  ° fexofenadine (ALLEGRA) 180 MG tablet Take 180 mg by mouth daily.    ° gabapentin (NEURONTIN) 100 MG capsule TAKE 1 CAPSULE IN THE MORNING AND TAKE 2 CAPSULES IN THE EVENING 270 capsule 0  ° glipiZIDE (GLUCOTROL XL) 5 MG 24 hr tablet Take 1 tablet by mouth once daily with breakfast 30 tablet 0  ° Krill Oil 350 MG CAPS Take 350 mg by mouth daily.    ° metolazone (ZAROXOLYN) 5 MG tablet TAKE 1 TABLET (5 MG TOTAL) BY MOUTH THREE TIMES A WEEK. (Patient taking differently: Take 5 mg by mouth See admin instructions. TAKE 1 TABLET (5 MG TOTAL) BY MOUTH THREE TIMES A WEEK AS NEEDED) 36 tablet 3  ° metoprolol tartrate (LOPRESSOR) 100 MG tablet  TAKE 1 TABLET TWICE DAILY 180 tablet 3  ° Misc. Devices MISC Sequential compression device - 1 pair. Apply them for 1 hour in the morning and 1 hour in the evening. 2 each 0  ° pantoprazole (PROTONIX) 40 MG tablet TAKE 1 TABLET EVERY DAY 90 tablet 3  ° potassium chloride SA (KLOR-CON) 20 MEQ tablet TAKE 3 TABS DAILY AND TAKE AN ADDITIONAL 1 TAB ON THE DAY YOU TAKE YOUR WEEKLY METOLAZONE DOSE 283 tablet 3  ° Semaglutide (RYBELSUS) 14 MG TABS Take 14 mg by mouth in the morning. 90 tablet 0  ° tamsulosin (FLOMAX) 0.4 MG CAPS capsule Take 1 capsule (0.4 mg total) by mouth daily. 30 capsule 11  ° Tiotropium Bromide Monohydrate (SPIRIVA RESPIMAT) 2.5 MCG/ACT AERS Inhale 2 puffs into the lungs daily. 4 g 5  ° torsemide (DEMADEX) 20 MG tablet Take 2 tablets (40 mg total) by mouth daily for 30 doses. 60 tablet 0  ° TRUE METRIX BLOOD GLUCOSE TEST test strip TEST ONE TIME DAILY FOR DIABETES 100 strip 5  ° TRUEplus Lancets 33G MISC USE TO TEST ONE TIME DAILY FOR DIABETES 100 each 5  ° TURMERIC-GINGER PO Take 2 each by mouth daily.    ° acetaminophen (TYLENOL) 325 MG tablet Take 650 mg by mouth every 6 (six) hours as needed for mild pain. (Patient not taking: Reported on 07/27/2021)    ° empagliflozin (JARDIANCE) 10 MG TABS tablet Take 1 tablet (10 mg total) by mouth daily before breakfast. (Patient not taking: Reported on 07/06/2021) 30 tablet 11  ° °No facility-administered medications prior to visit.  °  ° °Allergies:   Patient has no known allergies.  ° °Social History  ° °Socioeconomic History  ° Marital status: Divorced  °  Spouse name: Not on file  ° Number of children: Not on file  ° Years of education: Not on file  ° Highest education level: Not on file  °Occupational History  ° Not on file  °Tobacco Use  ° Smoking status: Former  °  Packs/day: 2.00  °  Years: 16.00  °  Pack years: 32.00  °  Types: Cigarettes  °  Quit date: 08/23/2017  °  Years since quitting: 3.9  ° Smokeless tobacco: Never  °Vaping Use  ° Vaping Use: Never  used  °Substance and Sexual Activity  ° Alcohol use: No  °  Comment:   Comment: have had alcohol in the past, not heavy   Drug use: No   Sexual activity: Not Currently    Partners: Female  Other Topics Concern   Not on file  Social History Narrative   Not on file   Social Determinants of Health   Financial Resource Strain: Low Risk    Difficulty of Paying Living Expenses: Not hard at all  Food Insecurity: No Food Insecurity   Worried About Charity fundraiser in the Last Year: Never true   Leigh in the Last Year: Never true  Transportation Needs: No Transportation Needs   Lack of Transportation (Medical): No   Lack of Transportation (Non-Medical): No  Physical Activity: Inactive   Days of Exercise per Week: 0 days   Minutes of Exercise per Session: 0 min  Stress: No Stress Concern Present   Feeling of Stress : Only a little  Social Connections: Socially Isolated   Frequency of Communication with Friends and Family: Once a week   Frequency of Social Gatherings with Friends and Family: Never   Attends Religious Services: Never   Printmaker: No   Attends Music therapist: More than 4 times per year   Marital Status: Never married     Family History:  [name] family history includes Alzheimer's disease in his mother; Diabetes in his brother; Heart attack in his brother and mother; Hypertension in his mother.   Review of Systems:    Please see the history of present illness.     All other systems reviewed and are otherwise negative except as noted above.   Physical Exam:    VS:  BP 124/70    Pulse 80    Ht 5' 11" (1.803 m)    Wt (!) 486 lb (220.4 kg)    SpO2 97%    BMI 67.78 kg/m    General: Pleasant obese male appearing in no acute distress. Head: Normocephalic, atraumatic. Neck: No carotid bruits. JVD not elevated.  Lungs: Respirations regular and unlabored, without wheezes or rales.  Heart: Regular rate and rhythm. No S3  or S4.  No murmur, no rubs, or gallops appreciated. Abdomen: Appears non-distended. No obvious abdominal masses. Msk:  Strength and tone appear normal for age. No obvious joint deformities or effusions. Extremities: No clubbing or cyanosis. Chronic appearing edema but improved from prior examinations.  Distal pedal pulses are 2+ bilaterally. Neuro: Alert and oriented X 3. Moves all extremities spontaneously. No focal deficits noted. Psych:  Responds to questions appropriately with a normal affect. Skin: No rashes or lesions noted  Wt Readings from Last 3 Encounters:  07/27/21 (!) 486 lb (220.4 kg)  07/21/21 (!) 493 lb 13.3 oz (224 kg)  07/01/21 (!) 484 lb (219.5 kg)     Studies/Labs Reviewed:   EKG:  EKG is not ordered today.    Recent Labs: 01/30/2021: B Natriuretic Peptide 67.0 03/25/2021: Magnesium 2.0 05/13/2021: TSH 3.920 07/26/2021: ALT 21; BUN 34; Creatinine, Ser 1.41; Hemoglobin 10.0; Platelets 299; Potassium 3.1; Sodium 135   Lipid Panel    Component Value Date/Time   CHOL 97 03/25/2021 0539   CHOL 123 06/10/2020 0837   TRIG 138 03/25/2021 0539   HDL 27 (L) 03/25/2021 0539   HDL 25 (L) 06/10/2020 0837   CHOLHDL 3.6 03/25/2021 0539   VLDL 28 03/25/2021 0539   LDLCALC 42 03/25/2021 0539   LDLCALC 54 06/10/2020 0837    Additional studies/ records that were  today include:  ° °Echocardiogram: 07/2021 °IMPRESSIONS  ° ° ° 1. Left ventricular ejection fraction, by estimation, is 60 to 65%. The  °left ventricle has normal function. Left ventricular endocardial border  °not optimally defined to evaluate regional wall motion. There is moderate  °concentric left ventricular  °hypertrophy. Left ventricular diastolic parameters are consistent with  °Grade II diastolic dysfunction (pseudonormalization). Elevated left atrial  °pressure.  ° 2. Right ventricular systolic function is normal. The right ventricular  °size is mildly enlarged. Tricuspid regurgitation signal is inadequate  for  °assessing PA pressure.  ° 3. Right atrial size was mildly dilated.  ° 4. The mitral valve was not well visualized. No evidence of mitral valve  °regurgitation.  ° 5. The aortic valve has been replaced by a 25 mm Edwards valve Aortic  °valve regurgitation is not visualized. Mean gradient 18 mm Hg. Only one  °Doppler tracing of AS showed increased acceleration time. 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  ° 6. Aortic root/ascending aorta has been repaired/replaced.  ° °Assessment:   ° °1. Chronic diastolic (congestive) heart failure (HCC)   °2. Medication management   °3. 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   °4. PAF (paroxysmal atrial fibrillation) (HCC)   °5. Atypical chest pain   °6. CHB (complete heart block) (HCC)   °7. AKI (acute kidney injury) (HCC)   ° ° ° °Plan:  ° °In order of problems listed above: ° °1. HFpEF °- Recent echocardiogram showed a preserved EF of 60-65% with Grade 2 DD. Suspect lymphedema plays a role in his symptoms and his edema has improved since using his compression machine. I encouraged him to continue using this, especially since recent fluctuation in his renal function limits titration of diuretic therapy. Will continue Torsemide 40mg daily and PRN Metolazone. He has been off of Jardiance for at least the past month due to recurrent UTI's and variable renal function.  ° °2. Aortic Stenosis °- He underwent bovine tissue AVR in 08/2017 with repair of ascending thoracic aortic aneurysm. Recent echo showed his valve was functioning normally as outlined above.  ° °3. Paroxysmal Atrial Fibrillation °- He did maintain NSR during recent admission and remains on Lopressor 100mg BID for rate-control. Was previously on Xarelto for anticoagulation but this was held following recent hospital discharge. He does have follow-up with Hematology later this week and is scheduled to follow-up with [facility] next month. I will reach out to Dr. Castaneda today to  gather his input in regards to restarting Xarelto.  ° °4. Atypical Chest Pain °- His recent pain was felt to be atypical for a cardiac etiology and enzymes were flat during admission. Given his normal coronary arteries by catheterization in 2019 and recent reassuring work-up, no plans for further ischemic testing at this this time. He remains on Atorvastatin and Lopressor. No ASA given plans for likely resumption of anticoagulation.  ° °5. CHB °- He is s/p St. Jude PPM placement in 08/2017 which is followed by Dr. Allred. Was functioning normally by interrogation in 05/2021.   ° °6. AKI °- His creatinine peaked at 4.07 during recent admission, improved to 1.41 by recent labs on 07/26/2021. He is no longer on Jardiance (has been off this for over a month) and Lisinopril was held following his recent admission. Would be hesitant to restart in the future as he has experienced recurrent AKI's with Lisinopril in the past.  ° ° ° °Medication Adjustments/Labs and Tests Ordered: °Current medicines   medicines are reviewed at length with the patient today.  Concerns regarding medicines are outlined above.  Medication changes, Labs and Tests ordered today are listed in the Patient Instructions below. Patient Instructions  Medication Instructions:   Continue current medication regimen. Will verify with GI when to restart Xarelto. Continue to hold for now.    *If you need a refill on your cardiac medications before your next appointment, please call your pharmacy*   Lab Work:  CBC and BMET in 7-10 days unless checked by Hematology in the interim.   If you have labs (blood work) drawn today and your tests are completely normal, you will receive your results only by: [facility] (if you have MyChart) OR A paper copy in the mail If you have any lab test that is abnormal or we need to change your treatment, we will call you to review the results.   Testing/Procedures:  None.   Follow-Up: At [facility], you and  your health needs are our priority.  As part of our continuing mission to provide you with exceptional heart care, we have created designated Provider Care Teams.  These Care Teams include your primary Cardiologist (physician) and Advanced Practice Providers (APPs -  Physician Assistants and Nurse Practitioners) who all work together to provide you with the care you need, when you need it.  We recommend signing up for the patient portal called "MyChart".  Sign up information is provided on this After Visit Summary.  MyChart is used to connect with patients for Virtual Visits (Telemedicine).  Patients are able to view lab/test results, encounter notes, upcoming appointments, etc.  Non-urgent messages can be sent to your provider as well.   To learn more about what you can do with MyChart, go to [email or web address].    Your next appointment:   3 month(s)  The format for your next appointment:   In Person  Provider:   You will see one of the following Advanced Practice Providers on your designated Care Team:   Bernerd Pho, PA-C  Ermalinda Barrios, PA-C       Signed, Erma [place], [place]  07/27/2021 8:14 PM    [facility]. [address] [place], [place] [address] Phone: [phone] Fax: [phone]

## 2021-07-27 NOTE — Telephone Encounter (Signed)
Please call regarding a compression Pump.  He said you had sent a note regarding this but they still have not received it.  Please call.  ?

## 2021-07-28 ENCOUNTER — Telehealth: Payer: Self-pay | Admitting: Student

## 2021-07-28 ENCOUNTER — Other Ambulatory Visit (INDEPENDENT_AMBULATORY_CARE_PROVIDER_SITE_OTHER): Payer: Self-pay

## 2021-07-28 DIAGNOSIS — D509 Iron deficiency anemia, unspecified: Secondary | ICD-10-CM

## 2021-07-28 NOTE — Telephone Encounter (Signed)
Pt notified via My Chart

## 2021-07-28 NOTE — Telephone Encounter (Signed)
Appt 08/15/2021 with Dr,Castaneda. Does he need anything prior to this appointment? Please advise. ?

## 2021-07-28 NOTE — Telephone Encounter (Signed)
Please repeat a CBC in one week ?Thanks ?

## 2021-07-28 NOTE — Telephone Encounter (Signed)
? ?  Please let the patient know I reviewed with GI and he can restart Xarelto with plans for close follow-up labs and if worsening anemia, would need to hold again. Therefore, can restart today and plan for a CBC in 1 week (orders entered yesterday for CBC and BMET). Keep scheduled follow-up with Hematology as well.  ? ?Thanks,  ?[place]  ? ?

## 2021-07-28 NOTE — Telephone Encounter (Signed)
Called pt no answer, left msg to call back  

## 2021-07-28 NOTE — Telephone Encounter (Signed)
I spoke with the patient advised he needed repeat blood work next week he is aware. The call was discontinued, I called the patient back left a detailed message he will need to have labs drawn next week at [facility] lab. I have placed the orders in epic, and placed at the front desk.Asked that he please return call. He called back and is aware of all.  ?

## 2021-07-29 ENCOUNTER — Other Ambulatory Visit: Payer: Self-pay

## 2021-07-29 ENCOUNTER — Encounter (HOSPITAL_COMMUNITY): Payer: Self-pay | Admitting: Hematology

## 2021-07-29 ENCOUNTER — Encounter: Payer: Self-pay | Admitting: Pulmonary Disease

## 2021-07-29 ENCOUNTER — Inpatient Hospital Stay (HOSPITAL_COMMUNITY): Payer: Medicare HMO

## 2021-07-29 ENCOUNTER — Ambulatory Visit: Payer: Medicare HMO | Admitting: Pulmonary Disease

## 2021-07-29 ENCOUNTER — Inpatient Hospital Stay (HOSPITAL_COMMUNITY): Payer: Medicare HMO | Attending: Hematology | Admitting: Hematology

## 2021-07-29 ENCOUNTER — Inpatient Hospital Stay: Payer: Medicare HMO | Admitting: Nurse Practitioner

## 2021-07-29 VITALS — BP 139/84 | HR 75 | Temp 98.0°F | Resp 22 | Ht 71.0 in | Wt >= 6400 oz

## 2021-07-29 DIAGNOSIS — D631 Anemia in chronic kidney disease: Secondary | ICD-10-CM | POA: Diagnosis not present

## 2021-07-29 DIAGNOSIS — Z79899 Other long term (current) drug therapy: Secondary | ICD-10-CM | POA: Diagnosis not present

## 2021-07-29 DIAGNOSIS — Z87891 Personal history of nicotine dependence: Secondary | ICD-10-CM | POA: Insufficient documentation

## 2021-07-29 DIAGNOSIS — D500 Iron deficiency anemia secondary to blood loss (chronic): Secondary | ICD-10-CM | POA: Diagnosis not present

## 2021-07-29 DIAGNOSIS — I480 Paroxysmal atrial fibrillation: Secondary | ICD-10-CM

## 2021-07-29 DIAGNOSIS — Z9989 Dependence on other enabling machines and devices: Secondary | ICD-10-CM

## 2021-07-29 DIAGNOSIS — G4733 Obstructive sleep apnea (adult) (pediatric): Secondary | ICD-10-CM

## 2021-07-29 DIAGNOSIS — Z7901 Long term (current) use of anticoagulants: Secondary | ICD-10-CM | POA: Diagnosis not present

## 2021-07-29 DIAGNOSIS — E559 Vitamin D deficiency, unspecified: Secondary | ICD-10-CM | POA: Diagnosis not present

## 2021-07-29 DIAGNOSIS — N189 Chronic kidney disease, unspecified: Secondary | ICD-10-CM | POA: Diagnosis not present

## 2021-07-29 DIAGNOSIS — Z86711 Personal history of pulmonary embolism: Secondary | ICD-10-CM | POA: Diagnosis not present

## 2021-07-29 DIAGNOSIS — D509 Iron deficiency anemia, unspecified: Secondary | ICD-10-CM | POA: Insufficient documentation

## 2021-07-29 LAB — RETICULOCYTES
Immature Retic Fract: 30.2 % — ABNORMAL HIGH (ref 2.3–15.9)
RBC.: 3.84 MIL/uL — ABNORMAL LOW (ref 4.22–5.81)
Retic Count, Absolute: 262.3 10*3/uL — ABNORMAL HIGH (ref 19.0–186.0)
Retic Ct Pct: 6.8 % — ABNORMAL HIGH (ref 0.4–3.1)

## 2021-07-29 LAB — CBC WITH DIFFERENTIAL/PLATELET
Abs Immature Granulocytes: 0.11 10*3/uL — ABNORMAL HIGH (ref 0.00–0.07)
Basophils Absolute: 0 10*3/uL (ref 0.0–0.1)
Basophils Relative: 0 %
Eosinophils Absolute: 0.4 10*3/uL (ref 0.0–0.5)
Eosinophils Relative: 4 %
HCT: 32.5 % — ABNORMAL LOW (ref 39.0–52.0)
Hemoglobin: 9.9 g/dL — ABNORMAL LOW (ref 13.0–17.0)
Immature Granulocytes: 1 %
Lymphocytes Relative: 17 %
Lymphs Abs: 1.9 10*3/uL (ref 0.7–4.0)
MCH: 25.2 pg — ABNORMAL LOW (ref 26.0–34.0)
MCHC: 30.5 g/dL (ref 30.0–36.0)
MCV: 82.7 fL (ref 80.0–100.0)
Monocytes Absolute: 0.6 10*3/uL (ref 0.1–1.0)
Monocytes Relative: 5 %
Neutro Abs: 7.9 10*3/uL — ABNORMAL HIGH (ref 1.7–7.7)
Neutrophils Relative %: 73 %
Platelets: 254 10*3/uL (ref 150–400)
RBC: 3.93 MIL/uL — ABNORMAL LOW (ref 4.22–5.81)
RDW: 19.9 % — ABNORMAL HIGH (ref 11.5–15.5)
WBC: 10.9 10*3/uL — ABNORMAL HIGH (ref 4.0–10.5)
nRBC: 0 % (ref 0.0–0.2)

## 2021-07-29 LAB — IRON AND TIBC
Iron: 62 ug/dL (ref 45–182)
Saturation Ratios: 14 % — ABNORMAL LOW (ref 17.9–39.5)
TIBC: 432 ug/dL (ref 250–450)
UIBC: 370 ug/dL

## 2021-07-29 LAB — LACTATE DEHYDROGENASE: LDH: 923 U/L — ABNORMAL HIGH (ref 98–192)

## 2021-07-29 LAB — VITAMIN B12: Vitamin B-12: 507 pg/mL (ref 180–914)

## 2021-07-29 LAB — FOLATE: Folate: 9.5 ng/mL (ref >5.9)

## 2021-07-29 LAB — FERRITIN
Ferritin: 178 ng/mL (ref 24–336)
Ferritin: 182 ng/mL (ref 24–336)

## 2021-07-29 NOTE — Patient Instructions (Addendum)
[facility] [facility] ?Discharge Instructions ? ?You were seen and examined today by Dr. Delton Coombes. Dr. Delton Coombes is a hematologist, meaning that he specializes in blood abnormalities. Dr. Delton Coombes discussed your past medical history, family history of cancers/blood conditions and the events that led to you being here today. ? ?You were referred to Dr. Delton Coombes due to anemia (low blood). Dr. Delton Coombes has recommended IV iron infusions. These will be given here in the [facility]. ? ?Follow-up as scheduled. ? ? ?Thank you for choosing [facility] at [facility] to provide your oncology and hematology care.  To afford each patient quality time with our provider, please arrive at least 15 minutes before your scheduled appointment time.  ? ?If you have a lab appointment with the [facility] please come in thru the Main Entrance and check in at the main information desk. ? ?You need to re-schedule your appointment should you arrive 10 or more minutes late.  We strive to give you quality time with our providers, and arriving late affects you and other patients whose appointments are after yours.  Also, if you no show three or more times for appointments you may be dismissed from the clinic at the providers discretion.     ?Again, thank you for choosing [facility].  Our hope is that these requests will decrease the amount of time that you wait before being seen by our physicians.       ?_____________________________________________________________ ? ?Should you have questions after your visit to [facility], please contact our office at [phone] and follow the prompts.  Our office hours are 8:00 a.m. and 4:30 p.m. Monday - Friday.  Please note that voicemails left after 4:00 p.m. may not be returned until the following business day.  We are closed weekends and major holidays.  You do have access to a nurse 24-7, just call the main  number to the clinic [phone] and do not press any options, hold on the line and a nurse will answer the phone.   ? ?For prescription refill requests, have your pharmacy contact our office and allow 72 hours.   ? ?Due to Covid, you will need to wear a mask upon entering the hospital. If you do not have a mask, a mask will be given to you at the Main Entrance upon arrival. For doctor visits, patients may have 1 support person age 34 or older with them. For treatment visits, patients can not have anyone with them due to social distancing guidelines and our immunocompromised population.  ? ? ? ?

## 2021-07-29 NOTE — Progress Notes (Signed)
[name], [name]   CLINIC:  Medical Oncology/Hematology  Patient Care Team: [name], MD as PCP - General (Internal Medicine) Thompson Grayer, MD as PCP - Electrophysiology (Cardiology) Josue Hector, MD as PCP - Cardiology (Cardiology) Kassie Mends, RN as [facility] Waldo Laine, Gwenyth Allegra, [facility] (Pharmacist)  CHIEF COMPLAINTS/PURPOSE OF CONSULTATION:  Evaluation of IDA  HISTORY OF PRESENTING ILLNESS:  Reginald [name] 51 y.o. male is here because of evaluation of IDA, at the request of Dr. Jenetta Downer.  Today he reports feeling fair. He currently has a nosebleed. He restarted taking Xarelto yesterday. He denies history of iron infusions. He reports previous PE. He reports fatigue. He reports occasional black stools, and he denies hematochezia. He reports frequent leg swellings; he recently purchased compression sleeves and will begin to wear them this week. He currently lives at home on his own. 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 He quit smoking in 2019 after smoking 2 ppd for 30 years. He denies family history of blood conditions or cancer.   MEDICAL HISTORY:  Past Medical History:  Diagnosis Date   Anemia    Anxiety    Aortic stenosis    Arthritis    Asthma    Back pain    Cellulitis of skin with lymphangitis    CHF (congestive heart failure) (HCC)    Chronic diastolic congestive heart failure (HCC)    Chronic venous insufficiency    Constipation    COPD (chronic obstructive pulmonary disease) (HCC)    Coronary artery disease with history of myocardial infarction without history of CABG    Depression    Diabetes ([place])    Dyspnea    Essential hypertension    Gout    Heart murmur    Hypertension    Morbid obesity ([place])    Obesity    Pneumonia    walking pneumonia   Prostatitis    Pulmonary embolism ([place])    Pulmonary hypertension (HCC)    Pyelonephritis    S/P aortic valve  replacement with bioprosthetic valve 08/23/2017   25 mm Edwards Inspiris Resilia stented bovine pericardial tissue valve   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   24 mm Hemashield supracoronary straight graft    Sleep apnea    cpap    Thoracic ascending aortic aneurysm    Thoracic ascending aortic aneurysm    Tobacco abuse     SURGICAL HISTORY: Past Surgical History:  Procedure Laterality Date   AORTIC VALVE REPLACEMENT N/A 08/23/2017   Procedure: AORTIC VALVE REPLACEMENT (AVR) USING INSPIRIS RESILIA AORTIC VALVE SIZE 25 MM;  Surgeon: Rexene Alberts, MD;  Location: [place];  Service: Open Heart Surgery;  Laterality: N/A;   CARDIAC VALVE REPLACEMENT N/A    Phreesia 02/07/2020   COLONOSCOPY WITH PROPOFOL N/A 11/14/2020   Procedure: COLONOSCOPY WITH PROPOFOL;  Surgeon: Gatha Mayer, MD;  Location: [facility] ENDOSCOPY;  Service: Endoscopy;  Laterality: N/A;   ESOPHAGOGASTRODUODENOSCOPY (EGD) WITH PROPOFOL N/A 11/14/2020   Procedure: ESOPHAGOGASTRODUODENOSCOPY (EGD) WITH PROPOFOL;  Surgeon: Gatha Mayer, MD;  Location: [place];  Service: Endoscopy;  Laterality: N/A;   GIVENS CAPSULE STUDY N/A 06/30/2021   Procedure: GIVENS CAPSULE STUDY;  Surgeon: Harvel Quale, MD;  [facility];  Service: Gastroenterology;  Laterality: N/A;  7:30   MULTIPLE EXTRACTIONS WITH ALVEOLOPLASTY N/A 07/23/2017   Procedure: Extraction of tooth #10 with alveoloplasty and gross debridement of remaining  teeth;  Surgeon: Lenn Cal, DDS;  Location: [place];  Service: Oral Surgery;  Laterality: N/A;   NASAL SEPTOPLASTY W/ TURBINOPLASTY Bilateral 07/01/2021   Procedure: NASAL SEPTOPLASTY WITH TURBINATE REDUCTION;  Surgeon: Leta Baptist, MD;  Location: [place];  Service: ENT;  Laterality: Bilateral;   PACEMAKER IMPLANT N/A 08/28/2017    St Jude Medical Assurity MRI conditional  dual-chamber pacemaker for symptomatic complete heart blockby Dr Rayann Heman   TEE WITHOUT CARDIOVERSION N/A 07/16/2017    Procedure: TRANSESOPHAGEAL ECHOCARDIOGRAM (TEE);  Surgeon: Lelon Perla, MD;  Location[facility] ENDOSCOPY;  Service: Cardiovascular;  Laterality: N/A;   TEE WITHOUT CARDIOVERSION N/A 08/23/2017   Procedure: TRANSESOPHAGEAL ECHOCARDIOGRAM (TEE);  Surgeon: Rexene Alberts, MD;  Location: [place];  Service: Open Heart Surgery;  Laterality: N/A;   THORACIC AORTIC ANEURYSM REPAIR N/A 08/23/2017   Procedure: THORACIC ASCENDING ANEURYSM REPAIR (AAA) USING HEMASHIELD GOLD KNITTED MICROVEL DOUBLE VELOUR VASCULAR GRAFT D: 24 MM  L: 30 CM;  Surgeon: Rexene Alberts, MD;  Location: [place];  Service: Open Heart Surgery;  Laterality: N/A;    SOCIAL HISTORY: Social History   Socioeconomic History   Marital status: Divorced    Spouse name: Not on file   Number of children: Not on file   Years of education: Not on file   Highest education level: Not on file  Occupational History   Not on file  Tobacco Use   Smoking status: Former    Packs/day: 2.00    Years: 16.00    Pack years: 32.00    Types: Cigarettes    Quit date: 08/23/2017    Years since quitting: 3.9   Smokeless tobacco: Never  Vaping Use   Vaping Use: Never used  Substance and Sexual Activity   Alcohol use: No    Comment: have had alcohol in the past, not heavy   Drug use: No   Sexual activity: Not Currently    Partners: Female  Other Topics Concern   Not on file  Social History Narrative   Not on file   Social Determinants of Health   Financial Resource Strain: Low Risk    Difficulty of Paying Living Expenses: Not hard at all  Food Insecurity: No Food Insecurity   Worried About Charity fundraiser in the Last Year: Never true   [place] in the Last Year: Never true  Transportation Needs: No Transportation Needs   Lack of Transportation (Medical): No   Lack of Transportation (Non-Medical): No  Physical Activity: Inactive   Days of Exercise per Week: 0 days   Minutes of Exercise per Session: 0 min  Stress: No Stress  Concern Present   Feeling of Stress : Only a little  Social Connections: Socially Isolated   Frequency of Communication with Friends and Family: Once a week   Frequency of Social Gatherings with Friends and Family: Never   Attends Religious Services: Never   Printmaker: No   Attends Music therapist: More than 4 times per year   Marital Status: Never married  Human resources officer Violence: Not At Risk   Fear of Current or Ex-Partner: No   Emotionally Abused: No   Physically Abused: No   Sexually Abused: No    FAMILY HISTORY: Family History  Problem Relation Age of Onset   Hypertension Mother    Alzheimer's disease Mother    Heart attack Mother    Heart attack Brother    Diabetes Brother  ALLERGIES:  has No Known Allergies.  MEDICATIONS:  Current Outpatient Medications  Medication Sig Dispense Refill   acetaminophen (TYLENOL) 325 MG tablet Take 650 mg by mouth every 6 (six) hours as needed for mild pain.     albuterol (PROVENTIL) (2.5 MG/3ML) 0.083% nebulizer solution INHALE THE CONTENTS OF 1 VIAL VIA NEBULIZER EVERY 6 HOURS AS NEEDED FOR WHEEZING OR SHORTNESS OF BREATH 270 mL 1   albuterol (VENTOLIN HFA) 108 (90 Base) MCG/ACT inhaler INHALE 2 PUFFS INTO THE LUNGS EVERY 6 (SIX) HOURS AS NEEDED FOR WHEEZING OR SHORTNESS OF BREATH. 1 each 1   allopurinol (ZYLOPRIM) 300 MG tablet TAKE 1 TABLET EVERY DAY 90 tablet 0   atorvastatin (LIPITOR) 40 MG tablet TAKE 1 TABLET EVERY DAY 90 tablet 3   Blood Glucose Monitoring Suppl (TRUE METRIX METER) w/Device KIT USE AS DIRECTED 1 kit 0   buPROPion (WELLBUTRIN XL) 300 MG 24 hr tablet Take 1 tablet (300 mg total) by mouth daily. 90 tablet 3   Cholecalciferol 125 MCG (5000 UT) TABS Take 5,000 Units by mouth daily.     docusate sodium (COLACE) 100 MG capsule Take 2 capsules (200 mg total) by mouth 2 (two) times daily. (Patient taking differently: Take 200 mg by mouth daily.) 30 capsule 0   FEROSUL 325  (65 Fe) MG tablet TAKE 1 TABLET EVERY DAY 90 tablet 1   fexofenadine (ALLEGRA) 180 MG tablet Take 180 mg by mouth daily.     gabapentin (NEURONTIN) 100 MG capsule TAKE 1 CAPSULE IN THE MORNING AND TAKE 2 CAPSULES IN THE EVENING 270 capsule 0   glipiZIDE (GLUCOTROL XL) 5 MG 24 hr tablet Take 1 tablet by mouth once daily with breakfast 30 tablet 0   Krill Oil 350 MG CAPS Take 350 mg by mouth daily.     metolazone (ZAROXOLYN) 5 MG tablet TAKE 1 TABLET (5 MG TOTAL) BY MOUTH THREE TIMES A WEEK. (Patient taking differently: Take 5 mg by mouth See admin instructions. TAKE 1 TABLET (5 MG TOTAL) BY MOUTH THREE TIMES A WEEK AS NEEDED) 36 tablet 3   metoprolol tartrate (LOPRESSOR) 100 MG tablet TAKE 1 TABLET TWICE DAILY 180 tablet 3   Misc. Devices MISC Sequential compression device - 1 pair. Apply them for 1 hour in the morning and 1 hour in the evening. 2 each 0   pantoprazole (PROTONIX) 40 MG tablet TAKE 1 TABLET EVERY DAY 90 tablet 3   potassium chloride SA (KLOR-CON) 20 MEQ tablet TAKE 3 TABS DAILY AND TAKE AN ADDITIONAL 1 TAB ON THE DAY YOU TAKE YOUR WEEKLY METOLAZONE DOSE 283 tablet 3   rivaroxaban (XARELTO) 20 MG TABS tablet Take 20 mg by mouth daily.     Semaglutide (RYBELSUS) 14 MG TABS Take 14 mg by mouth in the morning. 90 tablet 0   tamsulosin (FLOMAX) 0.4 MG CAPS capsule Take 1 capsule (0.4 mg total) by mouth daily. 30 capsule 11   Tiotropium Bromide Monohydrate (SPIRIVA RESPIMAT) 2.5 MCG/ACT AERS Inhale 2 puffs into the lungs daily. 4 g 5   torsemide (DEMADEX) 20 MG tablet Take 2 tablets (40 mg total) by mouth daily for 30 doses. 60 tablet 0   TRUE METRIX BLOOD GLUCOSE TEST test strip TEST ONE TIME DAILY FOR DIABETES 100 strip 5   TRUEplus Lancets 33G MISC USE TO TEST ONE TIME DAILY FOR DIABETES 100 each 5   TURMERIC-GINGER PO Take 2 each by mouth daily.     No current facility-administered medications for this visit.  REVIEW OF SYSTEMS:   Review of Systems  Constitutional:  Positive  for fatigue. Negative for appetite change.  HENT:   Positive for nosebleeds.   Respiratory:  Positive for cough and shortness of breath.   Cardiovascular:  Positive for leg swelling.  Gastrointestinal:  Positive for blood in stool (black stools).  Hematological:  Bruises/bleeds easily.  All other systems reviewed and are negative.   PHYSICAL EXAMINATION: ECOG PERFORMANCE STATUS: 1 - Symptomatic but completely ambulatory  Vitals:   07/29/21 1145  BP: 139/84  Pulse: 75  Resp: (!) 22  Temp: 98 F (36.7 C)  SpO2: 99%   Filed Weights   07/29/21 1145  Weight: (!) 490 lb 1.6 oz (222.3 kg)   Physical Exam Vitals reviewed.  Constitutional:      Appearance: Normal appearance. He is obese.     Comments: In wheelchair  Cardiovascular:     Rate and Rhythm: Normal rate and regular rhythm.     Pulses: Normal pulses.     Heart sounds: Normal heart sounds.  Pulmonary:     Effort: Pulmonary effort is normal.     Breath sounds: Normal breath sounds.  Neurological:     General: No focal deficit present.     Mental Status: He is alert and oriented to person, place, and time.  Psychiatric:        Mood and Affect: Mood normal.        Behavior: Behavior normal.     LABORATORY DATA:  I have reviewed the data as listed Recent Results (from the past 2160 hour(s))  VITAMIN D 25 Hydroxy (Vit-D Deficiency, Fractures)     Status: Abnormal   Collection Time: 05/13/21 10:01 AM  Result Value Ref Range   Vit D, 25-Hydroxy 24.8 (L) 30.0 - 100.0 ng/mL    Comment: Vitamin D deficiency has been defined by the [place] practice guideline as a level of serum 25-OH vitamin D less than 20 ng/mL (1,2). The Endocrine Society went on to further define vitamin D insufficiency as a level between 21 and 29 ng/mL (2). 1. IOM (Institute of Medicine). 2010. Dietary reference    intakes for calcium and D. [place]: The    [employer]. 2. Holick MF,  [place], Bischoff-Ferrari HA, et al.    Evaluation, treatment, and prevention of vitamin D    deficiency: an Endocrine Society clinical practice    guideline. JCEM. 2011 Jul; 96(7):1911-30.   TSH     Status: None   Collection Time: 05/13/21 10:01 AM  Result Value Ref Range   TSH 3.920 0.450 - 4.500 uIU/mL  CMP14+EGFR     Status: Abnormal   Collection Time: 05/13/21 10:01 AM  Result Value Ref Range   Glucose 234 (H) 70 - 99 mg/dL   BUN 22 6 - 24 mg/dL   Creatinine, Ser 1.43 (H) 0.76 - 1.27 mg/dL   eGFR 60 >59 mL/min/1.73   BUN/Creatinine Ratio 15 9 - 20   Sodium 137 134 - 144 mmol/L   Potassium 3.6 3.5 - 5.2 mmol/L   Chloride 86 (L) 96 - 106 mmol/L   CO2 34 (H) 20 - 29 mmol/L   Calcium 9.5 8.7 - 10.2 mg/dL   Total Protein 8.2 6.0 - 8.5 g/dL   Albumin 4.0 4.0 - 5.0 g/dL   Globulin, Total 4.2 1.5 - 4.5 g/dL   Albumin/Globulin Ratio 1.0 (L) 1.2 - 2.2   Bilirubin Total 1.3 (H) 0.0 - 1.2  mg/dL   Alkaline Phosphatase 103 44 - 121 IU/L   AST 51 (H) 0 - 40 IU/L   ALT 20 0 - 44 IU/L  CBC with Differential/Platelet     Status: Abnormal   Collection Time: 05/13/21 10:01 AM  Result Value Ref Range   WBC 11.1 (H) 3.4 - 10.8 x10E3/uL   RBC 5.16 4.14 - 5.80 x10E6/uL   Hemoglobin 12.3 (L) 13.0 - 17.7 g/dL   Hematocrit 41.3 37.5 - 51.0 %   MCV 80 79 - 97 fL   MCH 23.8 (L) 26.6 - 33.0 pg   MCHC 29.8 (L) 31.5 - 35.7 g/dL   RDW 18.2 (H) 11.6 - 15.4 %   Platelets 302 150 - 450 x10E3/uL   Neutrophils 70 Not Estab. %   Lymphs 19 Not Estab. %   Monocytes 6 Not Estab. %   Eos 4 Not Estab. %   Basos 0 Not Estab. %   Neutrophils Absolute 7.9 (H) 1.4 - 7.0 x10E3/uL   Lymphocytes Absolute 2.1 0.7 - 3.1 x10E3/uL   Monocytes Absolute 0.6 0.1 - 0.9 x10E3/uL   EOS (ABSOLUTE) 0.4 0.0 - 0.4 x10E3/uL   Basophils Absolute 0.1 0.0 - 0.2 x10E3/uL   Immature Granulocytes 1 Not Estab. %   Immature Grans (Abs) 0.1 0.0 - 0.1 x10E3/uL  Iron     Status: None   Collection Time: 05/13/21 10:01 AM  Result  Value Ref Range   Iron 70 38 - 169 ug/dL  Specimen status report     Status: None   Collection Time: 05/13/21 10:01 AM  Result Value Ref Range   specimen status report Comment     Comment: Written Authorization Written Authorization Written Authorization Received. Authorization received from DR PATEL MD 05-17-2021 Logged by Marcene Duos   Urinalysis, Routine w reflex microscopic     Status: Abnormal   Collection Time: 05/19/21  2:30 PM  Result Value Ref Range   Specific Gravity, UA 1.015 1.005 - 1.030   pH, UA 7.5 5.0 - 7.5   Color, UA Amber (A) Yellow   Appearance Ur Cloudy (A) Clear   Leukocytes,UA 3+ (A) Negative   Protein,UA Trace (A) Negative/Trace   Glucose, UA Trace (A) Negative   Ketones, UA Negative Negative   RBC, UA 3+ (A) Negative   Bilirubin, UA Negative Negative   Urobilinogen, Ur 0.2 0.2 - 1.0 mg/dL   Nitrite, UA Negative Negative   Microscopic Examination See below:   Microscopic Examination     Status: Abnormal   Collection Time: 05/19/21  2:30 PM   Urine  Result Value Ref Range   WBC, UA >30 (A) 0 - 5 /hpf   RBC >30 (A) 0 - 2 /hpf   Epithelial Cells (non renal) 0-10 0 - 10 /hpf   Renal Epithel, UA None seen None seen /hpf   Bacteria, UA Few None seen/Few  CUP PACEART REMOTE DEVICE CHECK     Status: None   Collection Time: 05/25/21  2:00 AM  Result Value Ref Range   Date Time Interrogation Session [id number]    Pulse Generator Manufacturer SJCR    Pulse Gen Model 2272 Assurity MRI    Pulse Gen Serial Number [id number]    Clinic Name [facility]    Implantable Pulse Generator Type Implantable Pulse Generator    Implantable Pulse Generator Implant Date 63893734    Implantable Lead Manufacturer [facility]    Implantable Lead Model LPA1200M Tendril MRI    Implantable Lead Serial Number [id number]  Implantable Lead Implant Date 32202542    Implantable Lead Location Detail 1 UNKNOWN    Implantable Lead Location [phone]    Implantable Lead Manufacturer  [facility]    Implantable Lead Model LPA1200M Tendril MRI    Implantable Lead Serial Number [id number]    Implantable Lead Implant Date 62831517    Implantable Lead Location Detail 1 UNKNOWN    Implantable Lead Location [phone]    Lead Channel Setting Sensing Sensitivity 1.5 mV   Lead Channel Setting Sensing Adaptation Mode Fixed Pacing    Lead Channel Setting Pacing Amplitude 2.0 V   Lead Channel Setting Pacing Pulse Width 0.5 ms   Lead Channel Setting Pacing Amplitude 2.5 V   Lead Channel Status     Lead Channel Impedance Value 610 ohm   Lead Channel Sensing Intrinsic Amplitude 5.0 mV   Lead Channel Pacing Threshold Amplitude 0.5 V   Lead Channel Pacing Threshold Pulse Width 0.5 ms   Lead Channel Status     Lead Channel Impedance Value 540 ohm   Lead Channel Sensing Intrinsic Amplitude 5.6 mV   Lead Channel Pacing Threshold Amplitude 0.75 V   Lead Channel Pacing Threshold Pulse Width 0.5 ms   Battery Status MOS    Battery Remaining Longevity 89 mo   Battery Remaining Percentage 72.0 %   Battery Voltage 3.01 V   Brady Statistic RA Percent Paced 1.0 %   Brady Statistic RV Percent Paced 1.0 %   Brady Statistic AP VP Percent 1.0 %   Brady Statistic AS VP Percent 1.0 %   Brady Statistic AP VS Percent 1.0 %   Brady Statistic AS VS Percent 99.0 %  Urinalysis, Routine w reflex microscopic     Status: Abnormal   Collection Time: 06/01/21  3:58 PM  Result Value Ref Range   Specific Gravity, UA 1.010 1.005 - 1.030   pH, UA 5.5 5.0 - 7.5   Color, UA Yellow Yellow   Appearance Ur Clear Clear   Leukocytes,UA Negative Negative   Protein,UA Negative Negative/Trace   Glucose, UA Negative Negative   Ketones, UA Negative Negative   RBC, UA Trace (A) Negative   Bilirubin, UA Negative Negative   Urobilinogen, Ur 0.2 0.2 - 1.0 mg/dL   Nitrite, UA Negative Negative   Microscopic Examination See below:   Microscopic Examination     Status: None   Collection Time: 06/01/21  3:58 PM   Urine   Result Value Ref Range   WBC, UA None seen 0 - 5 /hpf   RBC None seen 0 - 2 /hpf   Epithelial Cells (non renal) None seen 0 - 10 /hpf   Renal Epithel, UA None seen None seen /hpf   Bacteria, UA None seen None seen/Few  CBC     Status: Abnormal   Collection Time: 06/13/21  4:07 PM  Result Value Ref Range   WBC 9.4 3.4 - 10.8 x10E3/uL   RBC 4.77 4.14 - 5.80 x10E6/uL   Hemoglobin 11.9 (L) 13.0 - 17.7 g/dL   Hematocrit 37.3 (L) 37.5 - 51.0 %   MCV 78 (L) 79 - 97 fL   MCH 24.9 (L) 26.6 - 33.0 pg   MCHC 31.9 31.5 - 35.7 g/dL   RDW 17.5 (H) 11.6 - 15.4 %   Platelets 254 150 - 450 x10E3/uL  Iron, TIBC and Ferritin Panel     Status: None   Collection Time: 06/13/21  4:07 PM  Result Value Ref Range   Total Iron Binding Capacity  387 250 - 450 ug/dL   UIBC 302 111 - 343 ug/dL   Iron 85 38 - 169 ug/dL   Iron Saturation 22 15 - 55 %   Ferritin 279 30 - 400 ng/mL  Glucose, capillary     Status: Abnormal   Collection Time: 06/28/21  9:25 AM  Result Value Ref Range   Glucose-Capillary 110 (H) 70 - 99 mg/dL    Comment: Glucose reference range applies only to samples taken after fasting for at least 8 hours.  Hemoglobin A1c per protocol     Status: Abnormal   Collection Time: 06/28/21 10:24 AM  Result Value Ref Range   Hgb A1c MFr Bld 5.7 (H) 4.8 - 5.6 %    Comment: (NOTE) Pre diabetes:          5.7%-6.4%  Diabetes:              >6.4%  Glycemic control for   <7.0% adults with diabetes    Mean Plasma Glucose 116.89 mg/dL    Comment: Performed at [facility] [address], [place], [place] [address]  Basic metabolic panel per protocol     Status: Abnormal   Collection Time: 06/28/21 10:24 AM  Result Value Ref Range   Sodium 136 135 - 145 mmol/L   Potassium 4.5 3.5 - 5.1 mmol/L   Chloride 101 98 - 111 mmol/L   CO2 25 22 - 32 mmol/L   Glucose, Bld 109 (H) 70 - 99 mg/dL    Comment: Glucose reference range applies only to samples taken after fasting for at least 8 hours.   BUN  28 (H) 6 - 20 mg/dL   Creatinine, Ser 1.88 (H) 0.61 - 1.24 mg/dL   Calcium 9.0 8.9 - 10.3 mg/dL   GFR, Estimated 43 (L) >60 mL/min    Comment: (NOTE) Calculated using the CKD-EPI Creatinine Equation (2021)    Anion gap 10 5 - 15    Comment: Performed at [facility] [address], [place], [place] [address]  CBC per protocol     Status: Abnormal   Collection Time: 06/28/21 10:24 AM  Result Value Ref Range   WBC 10.8 (H) 4.0 - 10.5 K/uL   RBC 4.37 4.22 - 5.81 MIL/uL   Hemoglobin 10.8 (L) 13.0 - 17.0 g/dL   HCT 36.0 (L) 39.0 - 52.0 %   MCV 82.4 80.0 - 100.0 fL   MCH 24.7 (L) 26.0 - 34.0 pg   MCHC 30.0 30.0 - 36.0 g/dL   RDW 17.5 (H) 11.5 - 15.5 %   Platelets 351 150 - 400 K/uL   nRBC 0.0 0.0 - 0.2 %    Comment: Performed at [facility], [place] [address] [place], [place] [address]  Glucose, capillary     Status: Abnormal   Collection Time: 07/01/21 10:54 AM  Result Value Ref Range   Glucose-Capillary 113 (H) 70 - 99 mg/dL    Comment: Glucose reference range applies only to samples taken after fasting for at least 8 hours.  Glucose, capillary     Status: None   Collection Time: 07/01/21 12:47 PM  Result Value Ref Range   Glucose-Capillary 97 70 - 99 mg/dL    Comment: Glucose reference range applies only to samples taken after fasting for at least 8 hours.  Glucose, capillary     Status: Abnormal   Collection Time: 07/01/21  3:19 PM  Result Value Ref Range   Glucose-Capillary 140 (H) 70 -  99 mg/dL    Comment: Glucose reference range applies only to samples taken after fasting for at least 8 hours.  Glucose, capillary     Status: Abnormal   Collection Time: 07/01/21  8:45 PM  Result Value Ref Range   Glucose-Capillary 161 (H) 70 - 99 mg/dL    Comment: Glucose reference range applies only to samples taken after fasting for at least 8 hours.  Glucose, capillary     Status: Abnormal   Collection Time: 07/02/21  7:38 AM  Result Value Ref Range   Glucose-Capillary 111  (H) 70 - 99 mg/dL    Comment: Glucose reference range applies only to samples taken after fasting for at least 8 hours.  Glucose, capillary     Status: Abnormal   Collection Time: 07/02/21 11:43 AM  Result Value Ref Range   Glucose-Capillary 117 (H) 70 - 99 mg/dL    Comment: Glucose reference range applies only to samples taken after fasting for at least 8 hours.  CMP14+EGFR     Status: Abnormal   Collection Time: 07/05/21  1:08 PM  Result Value Ref Range   Glucose 82 70 - 99 mg/dL   BUN 19 6 - 24 mg/dL   Creatinine, Ser 1.20 0.76 - 1.27 mg/dL   eGFR 74 >59 mL/min/1.73   BUN/Creatinine Ratio 16 9 - 20   Sodium 143 134 - 144 mmol/L   Potassium 4.4 3.5 - 5.2 mmol/L   Chloride 100 96 - 106 mmol/L   CO2 26 20 - 29 mmol/L   Calcium 9.2 8.7 - 10.2 mg/dL   Total Protein 7.2 6.0 - 8.5 g/dL   Albumin 4.0 4.0 - 5.0 g/dL   Globulin, Total 3.2 1.5 - 4.5 g/dL   Albumin/Globulin Ratio 1.3 1.2 - 2.2   Bilirubin Total 1.0 0.0 - 1.2 mg/dL   Alkaline Phosphatase 90 44 - 121 IU/L   AST 43 (H) 0 - 40 IU/L   ALT 20 0 - 44 IU/L  CBC with Differential/Platelet     Status: Abnormal   Collection Time: 07/05/21  1:08 PM  Result Value Ref Range   WBC 10.0 3.4 - 10.8 x10E3/uL   RBC 4.06 (L) 4.14 - 5.80 x10E6/uL   Hemoglobin 10.2 (L) 13.0 - 17.7 g/dL   Hematocrit 32.2 (L) 37.5 - 51.0 %   MCV 79 79 - 97 fL   MCH 25.1 (L) 26.6 - 33.0 pg   MCHC 31.7 31.5 - 35.7 g/dL   RDW 17.7 (H) 11.6 - 15.4 %   Platelets 262 150 - 450 x10E3/uL   Neutrophils 68 Not Estab. %   Lymphs 22 Not Estab. %   Monocytes 6 Not Estab. %   Eos 3 Not Estab. %   Basos 0 Not Estab. %   Neutrophils Absolute 6.8 1.4 - 7.0 x10E3/uL   Lymphocytes Absolute 2.2 0.7 - 3.1 x10E3/uL   Monocytes Absolute 0.6 0.1 - 0.9 x10E3/uL   EOS (ABSOLUTE) 0.3 0.0 - 0.4 x10E3/uL   Basophils Absolute 0.0 0.0 - 0.2 x10E3/uL   Immature Granulocytes 1 Not Estab. %   Immature Grans (Abs) 0.1 0.0 - 0.1 [id number]  Basic metabolic panel     Status: Abnormal    Collection Time: 07/19/21  4:02 PM  Result Value Ref Range   Sodium 135 135 - 145 mmol/L   Potassium 4.2 3.5 - 5.1 mmol/L   Chloride 98 98 - 111 mmol/L   CO2 22 22 - 32 mmol/L   Glucose, Bld 106 (H)  70 - 99 mg/dL    Comment: Glucose reference range applies only to samples taken after fasting for at least 8 hours.   BUN 81 (H) 6 - 20 mg/dL   Creatinine, Ser 4.07 (H) 0.61 - 1.24 mg/dL   Calcium 8.6 (L) 8.9 - 10.3 mg/dL   GFR, Estimated 17 (L) >60 mL/min    Comment: (NOTE) Calculated using the CKD-EPI Creatinine Equation (2021)    Anion gap 15 5 - 15    Comment: Performed at [facility], [address] [place], [place] [address]  CBC     Status: Abnormal   Collection Time: 07/19/21  4:02 PM  Result Value Ref Range   WBC 11.7 (H) 4.0 - 10.5 K/uL   RBC 3.65 (L) 4.22 - 5.81 MIL/uL   Hemoglobin 9.0 (L) 13.0 - 17.0 g/dL   HCT 29.7 (L) 39.0 - 52.0 %   MCV 81.4 80.0 - 100.0 fL   MCH 24.7 (L) 26.0 - 34.0 pg   MCHC 30.3 30.0 - 36.0 g/dL   RDW 17.9 (H) 11.5 - 15.5 %   Platelets 314 150 - 400 K/uL   nRBC 0.0 0.0 - 0.2 %    Comment: Performed at [facility], [address], [place], [place] [address]  Troponin I (High Sensitivity)     Status: Abnormal   Collection Time: 07/19/21  4:02 PM  Result Value Ref Range   Troponin I (High Sensitivity) 19 (H) <18 ng/L    Comment: (NOTE) Elevated high sensitivity troponin I (hsTnI) values and significant  changes across serial measurements may suggest ACS but many other  chronic and acute conditions are known to elevate hsTnI results.  Refer to the "Links" section for chest pain algorithms and additional  guidance. Performed at [facility], [address], [place], [place] [address]   Lactic acid, plasma     Status: Abnormal   Collection Time: 07/19/21  4:02 PM  Result Value Ref Range   Lactic Acid, Venous 3.0 (HH) 0.5 - 1.9 mmol/L    Comment: CRITICAL RESULT CALLED TO, READ BACK BY AND VERIFIED WITH: GOFF,A ON 07/19/21 AT 1720 BY  LOY,C Performed at [facility], [address] [place], [place] [address]   D-dimer, quantitative     Status: None   Collection Time: 07/19/21  4:02 PM  Result Value Ref Range   D-Dimer, Quant 0.31 0.00 - 0.50 ug/mL-FEU    Comment: (NOTE) At the manufacturer cut-off value of 0.5 g/mL FEU, this assay has a negative predictive value of 95-100%.This assay is intended for use in conjunction with a clinical pretest probability (PTP) assessment model to exclude pulmonary embolism (PE) and deep venous thrombosis (DVT) in outpatients suspected of PE or DVT. Results should be correlated with clinical presentation. Performed at [facility], [address], [place], [place] [address]   Resp Panel by RT-PCR (Flu A&B, Covid) Nasopharyngeal Swab     Status: None   Collection Time: 07/19/21  5:27 PM   Specimen: Nasopharyngeal Swab; Nasopharyngeal(NP) swabs in vial transport medium  Result Value Ref Range   SARS Coronavirus 2 by RT PCR NEGATIVE NEGATIVE    Comment: (NOTE) SARS-CoV-2 target nucleic acids are NOT DETECTED.  The SARS-CoV-2 RNA is generally detectable in upper respiratory specimens during the acute phase of infection. The lowest concentration of SARS-CoV-2 viral copies this assay can detect is 138 copies/mL. A negative result does not preclude SARS-Cov-2 infection and should not be used as the sole basis for treatment or other  patient management decisions. A negative result may occur with  improper specimen collection/handling, submission of specimen other than nasopharyngeal swab, presence of viral mutation(s) within the areas targeted by this assay, and inadequate number of viral copies(<138 copies/mL). A negative result must be combined with clinical observations, patient history, and epidemiological information. The expected result is Negative.  Fact Sheet for Patients:  EntrepreneurPulse.com.au  Fact Sheet for Healthcare Providers:   IncredibleEmployment.be  This test is no t yet approved or cleared by the [place] FDA and  has been authorized for detection and/or diagnosis of SARS-CoV-2 by FDA under an Emergency Use Authorization (EUA). This EUA will remain  in effect (meaning this test can be used) for the duration of the COVID-19 declaration under Section 564(b)(1) of the Act, 21 U.S.C.section 360bbb-3(b)(1), unless the authorization is terminated  or revoked sooner.       Influenza A by PCR NEGATIVE NEGATIVE   Influenza B by PCR NEGATIVE NEGATIVE    Comment: (NOTE) The Xpert Xpress SARS-CoV-2/FLU/RSV plus assay is intended as an aid in the diagnosis of influenza from Nasopharyngeal swab specimens and should not be used as a sole basis for treatment. Nasal washings and aspirates are unacceptable for Xpert Xpress SARS-CoV-2/FLU/RSV testing.  Fact Sheet for Patients: EntrepreneurPulse.com.au  Fact Sheet for Healthcare Providers: IncredibleEmployment.be  This test is not yet approved or cleared by the [place] FDA and has been authorized for detection and/or diagnosis of SARS-CoV-2 by FDA under an Emergency Use Authorization (EUA). This EUA will remain in effect (meaning this test can be used) for the duration of the COVID-19 declaration under Section 564(b)(1) of the Act, 21 U.S.C. section 360bbb-3(b)(1), unless the authorization is terminated or revoked.  Performed at [facility], [address] [place], [place] [address]   Lactic acid, plasma     Status: Abnormal   Collection Time: 07/19/21  6:15 PM  Result Value Ref Range   Lactic Acid, Venous 2.1 (HH) 0.5 - 1.9 mmol/L    Comment: CRITICAL VALUE NOTED.  VALUE IS CONSISTENT WITH PREVIOUSLY REPORTED AND CALLED VALUE. Performed at [facility], [address] [place], [place] [address]   Troponin I (High Sensitivity)     Status: Abnormal   Collection Time: 07/19/21  6:15 PM  Result  Value Ref Range   Troponin I (High Sensitivity) 18 (H) <18 ng/L    Comment: (NOTE) Elevated high sensitivity troponin I (hsTnI) values and significant  changes across serial measurements may suggest ACS but many other  chronic and acute conditions are known to elevate hsTnI results.  Refer to the "Links" section for chest pain algorithms and additional  guidance. Performed at [facility], [address], [place], [place] [address]   Blood culture (routine x 2)     Status: None   Collection Time: 07/19/21  6:15 PM   Specimen: BLOOD LEFT HAND  Result Value Ref Range   Specimen Description BLOOD LEFT HAND    Special Requests      BOTTLES DRAWN AEROBIC AND ANAEROBIC Blood Culture adequate volume   Culture      NO GROWTH 6 DAYS Performed at [facility], [address], [place], [place] [address]    Report Status 07/25/2021 FINAL   Blood culture (routine x 2)     Status: None   Collection Time: 07/19/21  6:15 PM   Specimen: Right Antecubital; Blood  Result Value Ref Range   Specimen Description RIGHT ANTECUBITAL    Special Requests  BOTTLES DRAWN AEROBIC AND ANAEROBIC Blood Culture results may not be optimal due to an excessive volume of blood received in culture bottles   Culture      NO GROWTH 6 DAYS Performed at [facility], [address], [place], [place] [address]    Report Status 07/25/2021 FINAL   Hepatic function panel     Status: Abnormal   Collection Time: 07/19/21  6:15 PM  Result Value Ref Range   Total Protein 8.2 (H) 6.5 - 8.1 g/dL   Albumin 3.7 3.5 - 5.0 g/dL   AST 36 15 - 41 U/L   ALT 19 0 - 44 U/L   Alkaline Phosphatase 71 38 - 126 U/L   Total Bilirubin 0.7 0.3 - 1.2 mg/dL   Bilirubin, Direct 0.2 0.0 - 0.2 mg/dL   Indirect Bilirubin 0.5 0.3 - 0.9 mg/dL    Comment: Performed at [facility], [address], [place], [place] [address]  Type and screen [facility]     Status: None   Collection Time: 07/19/21  6:57 PM  Result Value Ref Range    ABO/RH(D) A NEG    Antibody Screen NEG    Sample Expiration 07/22/2021,2359    Unit Number [id number]    Blood Component Type RED CELLS,LR    Unit division 00    Status of Unit ISSUED,FINAL    Transfusion Status OK TO TRANSFUSE    Crossmatch Result      Compatible Performed at [facility], [address], [place], [place] [address]   Prepare RBC (crossmatch)     Status: None   Collection Time: 07/19/21  6:57 PM  Result Value Ref Range   Order Confirmation      ORDER PROCESSED BY BLOOD BANK Performed at [facility], [address] [place], [place] [address]   BPAM RBC     Status: None   Collection Time: 07/19/21  6:57 PM  Result Value Ref Range   ISSUE DATE / TIME [id number]    Blood Product Unit Number [id number]    PRODUCT CODE [id number]    Unit Type and Rh 0600    Blood Product Expiration Date 450388828003   Urinalysis, Routine w reflex microscopic     Status: None   Collection Time: 07/19/21  8:16 PM  Result Value Ref Range   Color, Urine YELLOW YELLOW   APPearance CLEAR CLEAR   Specific Gravity, Urine 1.008 1.005 - 1.030   pH 5.0 5.0 - 8.0   Glucose, UA NEGATIVE NEGATIVE mg/dL   Hgb urine dipstick NEGATIVE NEGATIVE   Bilirubin Urine NEGATIVE NEGATIVE   Ketones, ur NEGATIVE NEGATIVE mg/dL   Protein, ur NEGATIVE NEGATIVE mg/dL   Nitrite NEGATIVE NEGATIVE   Leukocytes,Ua NEGATIVE NEGATIVE    Comment: Performed at [facility], [address] [place], [place] [address]  Sodium, urine, random     Status: None   Collection Time: 07/19/21  9:34 PM  Result Value Ref Range   Sodium, Ur 64 mmol/L    Comment: Performed at [facility], [address], [place],  [address]  Urea nitrogen, urine     Status: None   Collection Time: 07/19/21  9:34 PM  Result Value Ref Range   Urea Nitrogen, Ur 241 Not Estab. mg/dL    Comment: (NOTE) Performed At: [facility] [address] [place], [place] [id number] Rush Farmer MD VV:[id number]   Creatinine,  urine, random     Status: None   Collection Time: 07/19/21  9:34 PM  Result Value Ref Range   Creatinine, Urine 73.02 mg/dL    Comment: Performed at [facility], [address], [place], [place] [address]  CBG monitoring, ED     Status: Abnormal   Collection Time: 07/19/21 11:29 PM  Result Value Ref Range   Glucose-Capillary 108 (H) 70 - 99 mg/dL    Comment: Glucose reference range applies only to samples taken after fasting for at least 8 hours.  Basic metabolic panel     Status: Abnormal   Collection Time: 07/20/21  5:40 AM  Result Value Ref Range   Sodium 135 135 - 145 mmol/L   Potassium 3.3 (L) 3.5 - 5.1 mmol/L    Comment: DELTA CHECK NOTED   Chloride 99 98 - 111 mmol/L   CO2 23 22 - 32 mmol/L   Glucose, Bld 139 (H) 70 - 99 mg/dL    Comment: Glucose reference range applies only to samples taken after fasting for at least 8 hours.   BUN 74 (H) 6 - 20 mg/dL   Creatinine, Ser 2.75 (H) 0.61 - 1.24 mg/dL    Comment: DELTA CHECK NOTED   Calcium 8.6 (L) 8.9 - 10.3 mg/dL   GFR, Estimated 27 (L) >60 mL/min    Comment: (NOTE) Calculated using the CKD-EPI Creatinine Equation (2021)    Anion gap 13 5 - 15    Comment: Performed at [facility], [address], [place], [place] [address]  CBC     Status: Abnormal   Collection Time: 07/20/21  5:40 AM  Result Value Ref Range   WBC 8.3 4.0 - 10.5 K/uL   RBC 3.41 (L) 4.22 - 5.81 MIL/uL   Hemoglobin 8.3 (L) 13.0 - 17.0 g/dL   HCT 28.1 (L) 39.0 - 52.0 %   MCV 82.4 80.0 - 100.0 fL   MCH 24.3 (L) 26.0 - 34.0 pg   MCHC 29.5 (L) 30.0 - 36.0 g/dL   RDW 17.9 (H) 11.5 - 15.5 %   Platelets 275 150 - 400 K/uL   nRBC 0.0 0.0 - 0.2 %    Comment: Performed at [facility], [address] [place], [place] [address]  Lactic acid, plasma     Status: Abnormal   Collection Time: 07/20/21  5:40 AM  Result Value Ref Range   Lactic Acid, Venous 5.0 (HH) 0.5 - 1.9 mmol/L    Comment: CRITICAL RESULT CALLED TO, READ BACK BY AND VERIFIED WITH: JACKSON,N AT  6:30AM ON 07/20/21 BY [facility] Performed at [facility], [address] [place], [place] [address]   Procalcitonin - Baseline     Status: None   Collection Time: 07/20/21  5:40 AM  Result Value Ref Range   Procalcitonin <0.10 ng/mL    Comment:        Interpretation: PCT (Procalcitonin) <= 0.5 ng/mL: Systemic infection (sepsis) is not likely. Local bacterial infection is possible. (NOTE)       Sepsis PCT Algorithm           Lower Respiratory Tract                                      Infection PCT Algorithm    ----------------------------     ----------------------------         PCT < 0.25 ng/mL                PCT < 0.10 ng/mL  Strongly encourage             Strongly discourage   discontinuation of antibiotics    initiation of antibiotics    ----------------------------     -----------------------------       PCT 0.25 - 0.50 ng/mL            PCT 0.10 - 0.25 ng/mL               OR       >80% decrease in PCT            Discourage initiation of                                            antibiotics      Encourage discontinuation           of antibiotics    ----------------------------     -----------------------------         PCT >= 0.50 ng/mL              PCT 0.26 - 0.50 ng/mL               AND        <80% decrease in PCT             Encourage initiation of                                             antibiotics       Encourage continuation           of antibiotics    ----------------------------     -----------------------------        PCT >= 0.50 ng/mL                  PCT > 0.50 ng/mL               AND         increase in PCT                  Strongly encourage                                      initiation of antibiotics    Strongly encourage escalation           of antibiotics                                     -----------------------------                                           PCT <= 0.25 ng/mL                                                 OR                                         >  80% decrease in PCT                                      Discontinue / Do not initiate                                             antibiotics  Performed at [facility], [address] [place], [place] [address]   Glucose, capillary     Status: Abnormal   Collection Time: 07/20/21  7:48 AM  Result Value Ref Range   Glucose-Capillary 119 (H) 70 - 99 mg/dL    Comment: Glucose reference range applies only to samples taken after fasting for at least 8 hours.   Comment 1 Notify RN    Comment 2 Document in Chart   Lactic acid, plasma     Status: Abnormal   Collection Time: 07/20/21  9:51 AM  Result Value Ref Range   Lactic Acid, Venous 2.9 (HH) 0.5 - 1.9 mmol/L    Comment: CRITICAL VALUE NOTED.  VALUE IS CONSISTENT WITH PREVIOUSLY REPORTED AND CALLED VALUE. Performed at [facility], [address], [place], [place] [address]   ECHOCARDIOGRAM COMPLETE     Status: None   Collection Time: 07/20/21 10:52 AM  Result Value Ref Range   Weight 7,894.23 oz   Height 71 in   BP 98/66 mmHg   AR max vel 1.09 cm2   AV Area VTI 0.99 cm2   AV Mean grad 18.0 mmHg   AV Peak grad 35.9 mmHg   Ao pk vel 3.00 m/s   AV Area mean vel 1.19 cm2   MV VTI 1.74 cm2   Area-P 1/2 3.76 cm2   S' Lateral 3.50 cm  Glucose, capillary     Status: None   Collection Time: 07/20/21 11:47 AM  Result Value Ref Range   Glucose-Capillary 86 70 - 99 mg/dL    Comment: Glucose reference range applies only to samples taken after fasting for at least 8 hours.   Comment 1 Notify RN    Comment 2 Document in Chart   Glucose, capillary     Status: Abnormal   Collection Time: 07/20/21  4:15 PM  Result Value Ref Range   Glucose-Capillary 136 (H) 70 - 99 mg/dL    Comment: Glucose reference range applies only to samples taken after fasting for at least 8 hours.  Glucose, capillary     Status: Abnormal   Collection Time: 07/20/21  8:25 PM  Result Value Ref Range   Glucose-Capillary 115 (H) 70 -  99 mg/dL    Comment: Glucose reference range applies only to samples taken after fasting for at least 8 hours.  Basic metabolic panel     Status: Abnormal   Collection Time: 07/21/21  5:51 AM  Result Value Ref Range   Sodium 136 135 - 145 mmol/L   Potassium 3.8 3.5 - 5.1 mmol/L   Chloride 101 98 - 111 mmol/L   CO2 25 22 - 32 mmol/L   Glucose, Bld 170 (H) 70 - 99 mg/dL    Comment: Glucose reference range applies only to samples taken after fasting for at least 8 hours.   BUN 47 (H) 6 - 20 mg/dL   Creatinine, Ser 1.51 (H) 0.61 - 1.24 mg/dL    Comment:  DELTA CHECK NOTED   Calcium 8.8 (L) 8.9 - 10.3 mg/dL   GFR, Estimated 56 (L) >60 mL/min    Comment: (NOTE) Calculated using the CKD-EPI Creatinine Equation (2021)    Anion gap 10 5 - 15    Comment: Performed at [facility], [address], [place], [place] [address]  CBC     Status: Abnormal   Collection Time: 07/21/21  5:51 AM  Result Value Ref Range   WBC 6.0 4.0 - 10.5 K/uL   RBC 3.31 (L) 4.22 - 5.81 MIL/uL   Hemoglobin 8.4 (L) 13.0 - 17.0 g/dL   HCT 27.3 (L) 39.0 - 52.0 %   MCV 82.5 80.0 - 100.0 fL   MCH 25.4 (L) 26.0 - 34.0 pg   MCHC 30.8 30.0 - 36.0 g/dL   RDW 17.4 (H) 11.5 - 15.5 %   Platelets 247 150 - 400 K/uL   nRBC 0.0 0.0 - 0.2 %    Comment: Performed at [facility], [address] [place], [place] [address]  Iron and TIBC     Status: Abnormal   Collection Time: 07/21/21  5:51 AM  Result Value Ref Range   Iron 59 45 - 182 ug/dL   TIBC 373 250 - 450 ug/dL   Saturation Ratios 16 (L) 17.9 - 39.5 %   UIBC 314 ug/dL    Comment: Performed at [facility], [address], [place], [place] [address]  Glucose, capillary     Status: Abnormal   Collection Time: 07/21/21  7:23 AM  Result Value Ref Range   Glucose-Capillary 122 (H) 70 - 99 mg/dL    Comment: Glucose reference range applies only to samples taken after fasting for at least 8 hours.  Glucose, capillary     Status: Abnormal   Collection Time: 07/21/21 11:11 AM   Result Value Ref Range   Glucose-Capillary 127 (H) 70 - 99 mg/dL    Comment: Glucose reference range applies only to samples taken after fasting for at least 8 hours.  Comprehensive Metabolic Panel (CMET)     Status: Abnormal   Collection Time: 07/26/21 12:29 PM  Result Value Ref Range   Sodium 135 135 - 145 mmol/L   Potassium 3.1 (L) 3.5 - 5.1 mmol/L   Chloride 95 (L) 98 - 111 mmol/L   CO2 29 22 - 32 mmol/L   Glucose, Bld 171 (H) 70 - 99 mg/dL    Comment: Glucose reference range applies only to samples taken after fasting for at least 8 hours.   BUN 34 (H) 6 - 20 mg/dL   Creatinine, Ser 1.41 (H) 0.61 - 1.24 mg/dL   Calcium 9.1 8.9 - 10.3 mg/dL   Total Protein 8.6 (H) 6.5 - 8.1 g/dL   Albumin 4.0 3.5 - 5.0 g/dL   AST 51 (H) 15 - 41 U/L   ALT 21 0 - 44 U/L   Alkaline Phosphatase 71 38 - 126 U/L   Total Bilirubin 1.6 (H) 0.3 - 1.2 mg/dL   GFR, Estimated >60 >60 mL/min    Comment: (NOTE) Calculated using the CKD-EPI Creatinine Equation (2021)    Anion gap 11 5 - 15    Comment: Performed at [facility], [address], [place], [place] [address]  CBC     Status: Abnormal   Collection Time: 07/26/21 12:29 PM  Result Value Ref Range   WBC 11.2 (H) 4.0 - 10.5 K/uL   RBC 3.89 (L) 4.22 - 5.81 MIL/uL   Hemoglobin 10.0 (L) 13.0 -  17.0 g/dL   HCT 32.2 (L) 39.0 - 52.0 %   MCV 82.8 80.0 - 100.0 fL   MCH 25.7 (L) 26.0 - 34.0 pg   MCHC 31.1 30.0 - 36.0 g/dL   RDW 18.6 (H) 11.5 - 15.5 %   Platelets 299 150 - 400 K/uL   nRBC 0.4 (H) 0.0 - 0.2 %    Comment: Performed at [facility], [address], [place], [place] [address]    RADIOGRAPHIC STUDIES: I have personally reviewed the radiological images as listed and agreed with the findings in the report. DG Chest 2 View  Result Date: 07/19/2021 CLINICAL DATA:  Chest pain with ambulation for 2 days, shortness of breath, headache, back pain EXAM: CHEST - 2 VIEW COMPARISON:  03/24/2021 FINDINGS: Frontal and lateral views of the chest  demonstrate stable postsurgical changes from median sternotomy. 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 Mild enlargement the cardiac silhouette. Chronic central vascular prominence without superimposed airspace disease, effusion, or pneumothorax. No acute bony abnormalities. IMPRESSION: 1. Stable chest, no acute process. Electronically Signed   By: Randa Ngo M.D.   On: 07/19/2021 15:45   ECHOCARDIOGRAM COMPLETE  Result Date: 07/20/2021    ECHOCARDIOGRAM REPORT   Patient Name:   CASY BRUNETTO Date of Exam: 07/20/2021 Medical Rec #:  [id number]    Height:       71.0 in Accession #:    [id number]   Weight:       493.4 lb Date of Birth:  06/22/1970   BSA:          3.095 m Patient Age:    25 years     BP:           98/66 mmHg Patient Gender: M            HR:           74 bpm. Exam Location:  [facility] Procedure: 2D Echo, Cardiac Doppler, Color Doppler and Intracardiac            Opacification Agent Indications:    Chest Pain  History:        Patient has prior history of Echocardiogram examinations, most                 recent 07/28/2020. CHF, Pacemaker, COPD, Arrythmias:Atrial                 Fibrillation, Signs/Symptoms:Shortness of Breath and Chest Pain;                 Risk Factors:Hypertension, Diabetes, Dyslipidemia and Former                 Smoker. 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 S/P Ascending Aortic replacement.  Sonographer:    DWenda LowReferring Phys: [id number] GuildBRANCH  Sonographer Comments: Technically difficult study due to poor echo windows and patient is morbidly obese. IMPRESSIONS  1. Left ventricular ejection fraction, by estimation, is 60 to 65%. The left ventricle has normal function. Left ventricular endocardial border not optimally defined to evaluate regional wall motion. There is moderate concentric left ventricular hypertrophy. Left ventricular diastolic parameters are consistent with Grade II diastolic dysfunction  (pseudonormalization). Elevated left atrial pressure.  2. Right ventricular systolic function is normal. The right ventricular size is mildly enlarged. Tricuspid regurgitation signal is inadequate for assessing PA pressure.  3. Right atrial size  was mildly dilated.  4. The mitral valve was not well visualized. No evidence of mitral valve regurgitation.  5. The aortic valve has been replaced by a 25 mm Edwards valve Aortic valve regurgitation is not visualized. Mean gradient 18 mm Hg. Only one Doppler tracing of AS showed increased acceleration time. 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  6. Aortic root/ascending aorta has been repaired/replaced. Comparison(s): RV function has improved. Stable prosthetic aortic parameters. FINDINGS  Left Ventricle: Left ventricular ejection fraction, by estimation, is 60 to 65%. The left ventricle has normal function. Left ventricular endocardial border not optimally defined to evaluate regional wall motion. Definity contrast agent was given IV to delineate the left ventricular endocardial borders. The left ventricular internal cavity size was normal in size. There is moderate concentric left ventricular hypertrophy. Left ventricular diastolic parameters are consistent with Grade II diastolic dysfunction (pseudonormalization). Elevated left atrial pressure. Right Ventricle: The right ventricular size is mildly enlarged. No increase in right ventricular wall thickness. Right ventricular systolic function is normal. Tricuspid regurgitation signal is inadequate for assessing PA pressure. Left Atrium: Left atrial size was normal in size. Right Atrium: Right atrial size was mildly dilated. Pericardium: Trivial pericardial effusion is present. Mitral Valve: MVA 1.73 cms by VTI. The mitral valve was not well visualized. No evidence of mitral valve regurgitation. MV peak gradient, 10.9 mmHg. The mean mitral valve gradient is 3.0 mmHg with average heart rate of 74 bpm. Tricuspid Valve: The tricuspid valve is not well  visualized. Tricuspid valve regurgitation is not demonstrated. No evidence of tricuspid stenosis. Aortic Valve: The aortic valve has been repaired/replaced. Aortic valve regurgitation is not visualized. Aortic valve mean gradient measures 18.0 mmHg. Aortic valve peak gradient measures 35.9 mmHg. Aortic valve area, by VTI measures 0.99 cm. Pulmonic Valve: The pulmonic valve was not well visualized. Pulmonic valve regurgitation is not visualized. No evidence of pulmonic stenosis. Aorta: The aortic root and ascending aorta are structurally normal, with no evidence of dilitation and the aortic root/ascending aorta has been repaired/replaced. IAS/Shunts: No atrial level shunt detected by color flow Doppler. Additional Comments: A device lead is visualized.  LEFT VENTRICLE PLAX 2D LVIDd:         5.90 cm   Diastology LVIDs:         3.50 cm   LV e' medial:    7.62 cm/s LV PW:         1.30 cm   LV E/e' medial:  15.0 LV IVS:        1.40 cm   LV e' lateral:   8.05 cm/s LVOT diam:     2.00 cm   LV E/e' lateral: 14.2 LV SV:         70 LV SV Index:   23 LVOT Area:     3.14 cm  RIGHT VENTRICLE RV Basal diam:  4.40 cm RV Mid diam:    3.90 cm LEFT ATRIUM           Index        RIGHT ATRIUM           Index LA diam:      4.80 cm 1.55 cm/m   RA Area:     28.10 cm LA Vol (A4C): 73.3 ml 23.68 ml/m  RA Volume:   106.00 ml 34.25 ml/m  AORTIC VALVE AV Area (Vmax):    1.09 cm AV Area (Vmean):   1.19 cm AV Area (VTI):     0.99 cm AV Vmax:  299.50 cm/s AV Vmean:          182.000 cm/s AV VTI:            0.708 m AV Peak Grad:      35.9 mmHg AV Mean Grad:      18.0 mmHg LVOT Vmax:         104.00 cm/s LVOT Vmean:        69.100 cm/s LVOT VTI:          0.223 m LVOT/AV VTI ratio: 0.31  AORTA Ao Root diam: 3.20 cm Ao Asc diam:  3.80 cm MITRAL VALVE MV Area (PHT): 3.76 cm     SHUNTS MV Area VTI:   1.74 cm     Systemic VTI:  0.22 m MV Peak grad:  10.9 mmHg    Systemic Diam: 2.00 cm MV Mean grad:  3.0 mmHg MV Vmax:       1.65 m/s MV  Vmean:      79.6 cm/s MV Decel Time: 202 msec MV E velocity: 114.00 cm/s MV A velocity: 73.30 cm/s MV E/A ratio:  1.56 Rudean Haskell MD Electronically signed by Rudean Haskell MD Signature Date/Time: 07/20/2021/2:59:57 PM    Final    CT CHEST ABDOMEN PELVIS WO CONTRAST  Result Date: 07/19/2021 CLINICAL DATA:  Aortic aneurysm, known or suspected. Pt c/o chest pain, SOB and dyspnea upon exertion. Also c/o diarrhea and nausea EXAM: CT CHEST, ABDOMEN AND PELVIS WITHOUT CONTRAST TECHNIQUE: Multidetector CT imaging of the chest, abdomen and pelvis was performed following the standard protocol without IV contrast. RADIATION DOSE REDUCTION: This exam was performed according to the departmental dose-optimization program which includes automated exposure control, adjustment of the mA and/or kV according to patient size and/or use of iterative reconstruction technique. COMPARISON:  CT renal 03/24/2021 FINDINGS: CT CHEST FINDINGS Cardiovascular: Normal heart size. No significant pericardial effusion. The thoracic aorta is normal in caliber. At least mild atherosclerotic plaque of the thoracic aorta. At least 2 vessel coronary artery calcifications. Status post aortic valve replacement. Mediastinum/Nodes: No gross hilar adenopathy, noting limited sensitivity for the detection of hilar adenopathy on this noncontrast study. A borderline enlarged 1 cm right paratracheal lymph node (2:18). No enlarged or axillary lymph nodes. Thyroid gland, trachea, and esophagus demonstrate no significant findings. Lungs/Pleura: No focal consolidation. No pulmonary nodule. No pulmonary mass. No pleural effusion. No pneumothorax. Musculoskeletal: Left chest wall dual lead cardiac pacemaker in grossly appropriate position. Marked bilateral gynecomastia. No chest wall mass or suspicious bone lesions identified. CT ABDOMEN PELVIS FINDINGS Hepatobiliary: The liver is enlarged measuring up to 20 8 cm. No focal liver abnormality.  Gallbladder is contracted. No gallstones, gallbladder wall thickening, or pericholecystic fluid. No biliary dilatation. Pancreas: No focal lesion. Normal pancreatic contour. No surrounding inflammatory changes. No main pancreatic ductal dilatation. Spleen: Enlarged measuring up to 15 cm. Evaluation for focal density. Adrenals/Urinary Tract: No adrenal nodule bilaterally. No nephrolithiasis and no hydronephrosis. No definite contour-deforming renal mass. No ureterolithiasis or hydroureter. The urinary bladder is unremarkable. Stomach/Bowel: Stomach is within normal limits. No evidence of bowel wall thickening or dilatation. Appendix appears normal. Vascular/Lymphatic: No abdominal aorta or iliac aneurysm. Mild atherosclerotic plaque of the aorta and its branches. No abdominal, pelvic, or inguinal lymphadenopathy. Reproductive: Prostate is unremarkable. Other: No intraperitoneal free fluid. No intraperitoneal free gas. No organized fluid collection. Musculoskeletal: No abdominal wall hernia or abnormality. No suspicious lytic or blastic osseous lesions. No acute displaced fracture. Multilevel degenerative changes of the spine with intervertebral disc space  vacuum phenomenon the L2-L3 and L3-L4 levels. IMPRESSION: 1. Hepatosplenomegaly. Markedly limited evaluation on this noncontrast study. 2. Nonspecific borderline enlarged right paratracheal lymph node (1 cm). 3. Aortic Atherosclerosis (ICD10-I70.0). Status post aortic valve repair. 4. Marked bilateral gynecomastia. Electronically Signed   By: Iven Finn M.D.   On: 07/19/2021 19:07    ASSESSMENT:  Normocytic anemia: - Patient seen at the request of Dr. Jenetta Downer - Recent hospitalization with acute renal failure, 1 unit PRBC on 07/21/2021 for hemoglobin of 8.3. - Labs on 07/21/2021 with percent saturation 16.  Ferritin was not done. - EGD and colonoscopy in June 2022 with hemorrhoids but no other potential etiology for bleeding. - Consultation with [facility]  scheduled in April 2023. - He was on Xarelto since 2019.  He had a history of PE and also has paroxysmal atrial fibrillation.  Xarelto was recently discontinued during his hospitalization on 07/21/2021.  He started back on Xarelto on 07/28/2021. - He had a couple of nosebleeds since Xarelto started back.  Denies any BRBPR.  He has occasional black stools. - He has been taking iron tablet daily for the past 1 year.   Social/family history: - He lives by himself.  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 - Quit smoking on 08/23/2017.  Smoked 2 packs/day for 30 years. - No family history of malignancies.   PLAN:  Normocytic anemia: - Combination anemia from CKD and relative iron deficiency. - Would rule out other nutritional deficiencies by checking G51, folic acid, MMA and copper.  We will also check LDH, reticulocyte count, ferritin.  We will check SPEP and free light chains. - We talked about scheduling him for parenteral iron therapy.  We discussed side effects in detail.  We will order Feraheme x2. - RTC 4 to 6 weeks with repeat CBC, ferritin and iron panel.   All questions were answered. The patient knows to call the clinic with any problems, questions or concerns.  Derek Jack, MD 07/29/21 12:22 PM  [place] [phone]   I, Thana Ates, am acting as a scribe for Dr. Derek Jack.  I, Derek Jack MD, have reviewed the above documentation for accuracy and completeness, and I agree with the above.

## 2021-07-29 NOTE — Progress Notes (Signed)
? ?  Subjective:  ? ? Patient ID: Reginald [name], male    DOB: Jan 25, 1971, 51 y.o.   MRN: [id number] ? ?HPI ? ?51 yo obese man for follow-up of OSA and mild pulmonary hypertension ?  ?PMH - severe AS (s/p bovine tissue AVR in 08/2017 with repair of ascending thoracic aortic aneurysm), normal coronary arteries by cardiac catheterization in 2019, paroxysmal atrial fibrillation, CHB (s/p PPM placement in 08/2017, history of bilateral PE on Xarelto, HTN, morbid obesity  ? ?-Upper GI bleed, EGD, colonoscopy and capsule study negative ? ?OSA was dying nose in 2010 and he was using a loaner CPAP machine from a friend.  We got him a new autoCPAP in September 2022.  We changed settings on his last visit to 10 to 15 cm.  He got frustrated with his DME, [place] and finally got a new ResMed machine on his own.  He is settled down with this, same settings auto 10 to 15 cm and is resting much better. ? ?Unfortunately he was hospitalized 2/20 to 3/2 for hypotension hemoglobin dropped to 8.3 requiring 1 unit PRBC transfusion.  CT chest/abdomen/pelvis was negative.  He also had AKI with BUN/creatinine of 21/4.1, improved to 1.4 on discharge. ?He has been referred to [facility].  Xarelto was held and he is just restarted yesterday. ? ?Labs were repeated on 3/7 which shows hypokalemia, hemoglobin of 10, BUN/creatinine of 34/1.4 ? ? ?Significant tests/ events reviewed ?NPSG 05/2020 >> AHI 29/hour ?  ?PFTs 07/2017 mild restriction, no obstruction, ratio 77, FEV1 75%, FVC 76%, no bronchodilator response, TLC 88%, DLCO 74% but corrects for alveolar volume ?  ?Echo 02/2019 normal LVEF, decreased RV function, RVSP 39 ? ? ?Review of Systems ? ?neg for any significant sore throat, dysphagia, itching, sneezing, nasal congestion or excess/ purulent secretions, fever, chills, sweats, unintended wt loss, pleuritic or exertional cp, hempoptysis, orthopnea pnd or change in chronic leg swelling. Also denies presyncope, palpitations, heartburn,  abdominal pain, nausea, vomiting, diarrhea or change in bowel or urinary habits, dysuria,hematuria, rash, arthralgias, visual complaints, headache, numbness weakness or ataxia. ? ?   ?Objective:  ? Physical Exam ? ?Gen. Pleasant, obese, in no distress, normal affect ?ENT - no pallor,icterus, no post nasal drip, class 2-3 airway ?Neck: No JVD, no thyromegaly, no carotid bruits ?Lungs: no use of accessory muscles, no dullness to percussion, decreased without rales or rhonchi  ?Cardiovascular: Rhythm regular, heart sounds  normal, no murmurs or gallops, no peripheral edema ?Abdomen: soft and non-tender, no hepatosplenomegaly, BS normal. ?Musculoskeletal: No deformities, no cyanosis or clubbing ?Neuro:  alert, non focal, no tremors ? ? ? ?   ?Assessment & Plan:  ? ? ?

## 2021-07-29 NOTE — Assessment & Plan Note (Signed)
Reviewed CPAP download data on his phone which shows excellent control of events less than 3/hour on auto CPAP settings 10 to 15 cm with moderate leak. ?He states that the bleed does not bother him, he wants to hold onto his beard ?He would rather get his own supplies and we will renew his prescriptions as needed ? ?Weight loss encouraged, compliance with goal of at least 4-6 hrs every night is the expectation. ?Advised against medications with sedative side effects ?Cautioned against driving when sleepy - understanding that sleepiness will vary on a day to day basis ? ?

## 2021-07-29 NOTE — Assessment & Plan Note (Signed)
Hemoglobin has improved to 10 on repeat labs 3/7, renal function is stable. ?He has just restarted Xarelto and repeat labs are planned next week ?

## 2021-07-29 NOTE — Patient Instructions (Signed)
?  CPAP is working well ?CPAP supplies will  be renewed x 1 year ? ? ?

## 2021-07-31 LAB — COPPER, SERUM: Copper: 210 ug/dL — ABNORMAL HIGH (ref 69–132)

## 2021-08-01 NOTE — Progress Notes (Signed)
Intravenous Iron Formulation Change ? ?Reginald [name] has insurance that requires a change in intravenous iron product from Feraheme to Venofer. Orders have been updated to reflect this change and scheduling message sent to adjust infusion appointments. Dr Delton Coombes notified and agrees with the plan. ? ?Allergies: No Known Allergies ? ?The plan for iron therapy is as follows: Venofer 300 mg weekly x 2 doses then 400 mg IVPB x 1 ? ?Wynona Neat, PharmD  ?08/01/2021 ?  ?

## 2021-08-02 ENCOUNTER — Telehealth: Payer: Self-pay | Admitting: Student

## 2021-08-02 ENCOUNTER — Ambulatory Visit (INDEPENDENT_AMBULATORY_CARE_PROVIDER_SITE_OTHER): Payer: Medicare HMO | Admitting: *Deleted

## 2021-08-02 DIAGNOSIS — E1169 Type 2 diabetes mellitus with other specified complication: Secondary | ICD-10-CM

## 2021-08-02 DIAGNOSIS — I5032 Chronic diastolic (congestive) heart failure: Secondary | ICD-10-CM

## 2021-08-02 DIAGNOSIS — J449 Chronic obstructive pulmonary disease, unspecified: Secondary | ICD-10-CM

## 2021-08-02 LAB — PROTEIN ELECTROPHORESIS, SERUM
A/G Ratio: 0.8 (ref 0.7–1.7)
Albumin ELP: 3.5 g/dL (ref 2.9–4.4)
Alpha-1-Globulin: 0.3 g/dL (ref 0.0–0.4)
Alpha-2-Globulin: 0.6 g/dL (ref 0.4–1.0)
Beta Globulin: 1.3 g/dL (ref 0.7–1.3)
Gamma Globulin: 2 g/dL — ABNORMAL HIGH (ref 0.4–1.8)
Globulin, Total: 4.2 g/dL — ABNORMAL HIGH (ref 2.2–3.9)
Total Protein ELP: 7.7 g/dL (ref 6.0–8.5)

## 2021-08-02 LAB — KAPPA/LAMBDA LIGHT CHAINS
Kappa free light chain: 81.9 mg/L — ABNORMAL HIGH (ref 3.3–19.4)
Kappa, lambda light chain ratio: 1.46 (ref 0.26–1.65)
Lambda free light chains: 56 mg/L — ABNORMAL HIGH (ref 5.7–26.3)

## 2021-08-02 LAB — METHYLMALONIC ACID, SERUM: Methylmalonic Acid, Quantitative: 182 nmol/L (ref 0–378)

## 2021-08-02 NOTE — Chronic Care Management (AMB) (Signed)
?Chronic Care Management  ? ?CCM RN Visit Note ? ?08/02/2021 ?Name: Reginald [name] MRN: [id number] DOB: November 08, 1970 ? ?Subjective: ?Reginald [name] is a 51 y.o. year old male who is a primary care patient of [name], MD. The care management team was consulted for assistance with disease management and care coordination needs.   ? ?Engaged with patient by telephone for follow up visit in response to provider referral for case management and/or care coordination services.  ? ?Consent to Services:  ?The patient was given information about Chronic Care Management services, agreed to services, and gave verbal consent prior to initiation of services.  Please see initial visit note for detailed documentation.  ? ?Patient agreed to services and verbal consent obtained.  ? ?Assessment: Review of patient past medical history, allergies, medications, health status, including review of consultants reports, laboratory and other test data, was performed as part of comprehensive evaluation and provision of chronic care management services.  ? ?SDOH (Social Determinants of Health) assessments and interventions performed:   ? ?CCM Care Plan ? ?No Known Allergies ? ?Outpatient Encounter Medications as of 08/02/2021  ?Medication Sig Note  ? acetaminophen (TYLENOL) 325 MG tablet Take 650 mg by mouth every 6 (six) hours as needed for mild pain.   ? albuterol (PROVENTIL) (2.5 MG/3ML) 0.083% nebulizer solution INHALE THE CONTENTS OF 1 VIAL VIA NEBULIZER EVERY 6 HOURS AS NEEDED FOR WHEEZING OR SHORTNESS OF BREATH   ? albuterol (VENTOLIN HFA) 108 (90 Base) MCG/ACT inhaler INHALE 2 PUFFS INTO THE LUNGS EVERY 6 (SIX) HOURS AS NEEDED FOR WHEEZING OR SHORTNESS OF BREATH.   ? allopurinol (ZYLOPRIM) 300 MG tablet TAKE 1 TABLET EVERY DAY   ? atorvastatin (LIPITOR) 40 MG tablet TAKE 1 TABLET EVERY DAY   ? Blood Glucose Monitoring Suppl (TRUE METRIX METER) w/Device KIT USE AS DIRECTED   ? buPROPion (WELLBUTRIN XL) 300 MG 24 hr tablet Take 1 tablet  (300 mg total) by mouth daily.   ? Cholecalciferol 125 MCG (5000 UT) TABS Take 5,000 Units by mouth daily.   ? docusate sodium (COLACE) 100 MG capsule Take 2 capsules (200 mg total) by mouth 2 (two) times daily. (Patient taking differently: Take 200 mg by mouth daily.)   ? FEROSUL 325 (65 Fe) MG tablet TAKE 1 TABLET EVERY DAY   ? fexofenadine (ALLEGRA) 180 MG tablet Take 180 mg by mouth daily.   ? gabapentin (NEURONTIN) 100 MG capsule TAKE 1 CAPSULE IN THE MORNING AND TAKE 2 CAPSULES IN THE EVENING   ? glipiZIDE (GLUCOTROL XL) 5 MG 24 hr tablet Take 1 tablet by mouth once daily with breakfast   ? Krill Oil 350 MG CAPS Take 350 mg by mouth daily.   ? metolazone (ZAROXOLYN) 5 MG tablet TAKE 1 TABLET (5 MG TOTAL) BY MOUTH THREE TIMES A WEEK. (Patient taking differently: Take 5 mg by mouth See admin instructions. TAKE 1 TABLET (5 MG TOTAL) BY MOUTH THREE TIMES A WEEK AS NEEDED) 04/19/2021: Using as needed of cardiology   ? metoprolol tartrate (LOPRESSOR) 100 MG tablet TAKE 1 TABLET TWICE DAILY   ? Misc. Devices MISC Sequential compression device - 1 pair. Apply them for 1 hour in the morning and 1 hour in the evening.   ? pantoprazole (PROTONIX) 40 MG tablet TAKE 1 TABLET EVERY DAY   ? potassium chloride SA (KLOR-CON) 20 MEQ tablet TAKE 3 TABS DAILY AND TAKE AN ADDITIONAL 1 TAB ON THE DAY YOU TAKE YOUR WEEKLY METOLAZONE DOSE   ?  rivaroxaban (XARELTO) 20 MG TABS tablet Take 20 mg by mouth daily.   ? Semaglutide (RYBELSUS) 14 MG TABS Take 14 mg by mouth in the morning. 03/03/2021: Gets from patient assistance ([employer])  ? tamsulosin (FLOMAX) 0.4 MG CAPS capsule Take 1 capsule (0.4 mg total) by mouth daily.   ? Tiotropium Bromide Monohydrate (SPIRIVA RESPIMAT) 2.5 MCG/ACT AERS Inhale 2 puffs into the lungs daily. 03/03/2021: Gets from patient assistance ([place])  ? torsemide (DEMADEX) 20 MG tablet Take 2 tablets (40 mg total) by mouth daily for 30 doses.   ? TRUE METRIX BLOOD GLUCOSE TEST test strip TEST ONE TIME  DAILY FOR DIABETES   ? TRUEplus Lancets 33G MISC USE TO TEST ONE TIME DAILY FOR DIABETES   ? TURMERIC-GINGER PO Take 2 each by mouth daily.   ? ?No facility-administered encounter medications on file as of 08/02/2021.  ? ? ?Patient Active Problem List  ? Diagnosis Date Noted  ? Symptomatic anemia   ? Acute renal failure superimposed on stage 2 chronic kidney disease ([place]) 07/19/2021  ? Chest pain 07/19/2021  ? Hypotension due to hypovolemia 07/19/2021  ? S/P nasal septoplasty 07/01/2021  ? Melena 06/13/2021  ? Wound cellulitis 06/09/2021  ? SOB (shortness of breath) 06/09/2021  ? Pain due to onychomycosis of toenails of both feet 05/02/2021  ? Diabetic neuropathy ([place]) 05/02/2021  ? Callus of foot 05/02/2021  ? BPH with obstruction/lower urinary tract symptoms 04/05/2021  ? Acute on chronic diastolic CHF (congestive heart failure) ([place]) 03/25/2021  ? Morbid obesity with BMI of 60.0-69.9, adult ([place]) 03/25/2021  ? Mixed hyperlipidemia 03/24/2021  ? Atrial fibrillation, chronic ([place]) 03/24/2021  ? Prolonged QT interval 03/24/2021  ? Iron deficiency anemia 02/21/2021  ? Constipation 02/21/2021  ? Chronic blood loss anemia 11/12/2020  ? Gastrointestinal hemorrhage 10/08/2020  ? S/P placement of cardiac pacemaker 02/10/2020  ? Encounter for examination following treatment at hospital 02/10/2020  ? Anxiety 02/10/2020  ? DM2 (diabetes mellitus, type 2) ([place]) 02/10/2020  ? OSA on CPAP   ? History of pulmonary embolism 03/16/2019  ? Paroxysmal atrial fibrillation ([place]) 09/05/2018  ? COPD (chronic obstructive pulmonary disease) ([place]) 05/07/2018  ? Gastroesophageal reflux disease   ? Pulmonary embolism ([place]) 09/18/2017  ? 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 08/23/2017  ? 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 Pulmonary hypertension ([place])   ? Chronic periodontitis 07/18/2017  ? Morbid obesity ([place])   ? Essential hypertension   ? Tobacco abuse   ? Chronic heart  failure with preserved ejection fraction (HFpEF) ([place])   ? Chronic venous insufficiency   ? ? ?Conditions to be addressed/monitored:CHF, COPD, and DMII ? ?Care Plan : RN Care Manager plan of care  ?Updates made by Kassie Mends, RN since 08/02/2021 12:00 AM  ?  ? ?Problem: No plan of care established for management of chronic disease states (CHF, COPD)   ?Priority: High  ?  ? ?Long-Range Goal: Development of plan of care for chronic disease management (CHF, COPD)   ?Start Date: 04/05/2021  ?Expected End Date: 11/06/2021  ?Priority: High  ?Note:   ?Current Barriers:  ?Knowledge Deficits related to plan of care for management of CHF and COPD  ?Knowledge deficit related to basic heart failure pathophysiology and self care management- needs reinforcement for HF action plan, symptom management ?Patient hospitalized 2/28 for AKI, pt states he had chest pain and low blood pressure and kidneys were affected. ?Patient reports recent Hgb  9.9 on 07/29/21 and scheduled to start venofer iron infusions on 08/05/21.  ?Does not adhere to provider recommendations re: low sodium diet, sometimes eats fast food, does not exercise, feels he is not able, stays tired, pt reports he checks CBG once daily with most recent AIC 5.8, CBG ranges 150-180 per pt and metformin has been discontinued. Checks blood pressure daily, denies any GI bleeding, melena or constipation today. Patient does continue to weigh daily with weight today 485 pounds. Patient continues working with pharmacist for any medication related needs.  Pt reports he has all medications and taking as prescribed, reports had hematuria which has now resolved and continues following up with urologist Dr. Felipa Eth, had cystoscopy and states " it was normal"  Pt reports had nasal septoplasty with turbinate reduction back in February, wound to left leg is healed.   ?Lacks social connections- pt lives alone, has a friend he can call if needed, does not communicate much with his family,  gets out and drives to the grocery store and flea market to talk with friends ?Knowledge deficits related to basic understanding of COPD disease process- needs reinforcement of COPD action plan, symptom

## 2021-08-02 NOTE — Telephone Encounter (Signed)
? ?  Received approval from Dr. Harl Bowie and Dr. Johnsie Cancel that the patient can switch to having Dr. Harl Bowie as his Primary Cardiologist. Care Team list updated in Epic.  ? ?Signed, ?Erma Heritage, PA-C ?08/02/2021, 12:34 PM ? ? ?

## 2021-08-02 NOTE — Patient Instructions (Signed)
Visit Information ? ?Thank you for taking time to visit with me today. Please don't hesitate to contact me if I can be of assistance to you before our next scheduled telephone appointment. ? ?Following are the goals we discussed today:  ?Take medications as prescribed   ?Attend all scheduled provider appointments ?Attend church or other social activities ?Perform all self care activities independently  ?Perform IADL's (shopping, preparing meals, housekeeping, managing finances) independently ?Call provider office for new concerns or questions  ?keep legs up while sitting ?track weight in diary ?watch for swelling in feet, ankles and legs every day ?bring diary to all appointments ?follow rescue plan if symptoms flare-up ?check blood sugar at prescribed times: once daily ?check feet daily for cuts, sores or redness ?take the blood sugar log to all doctor visits ?take the blood sugar meter to all doctor visits ?limit fast food meals to no more than 1 per week ?prepare main meal at home 3 to 5 days each week ?read food labels for fat, fiber, carbohydrates and portion size ?Heart Failure- ?- Important- call cardiologist office if I gain more than 3 pounds in one day or 5 pounds in one week- follow heart failure action plan, know how you are feeling each day with emphasis on yellow zone ?- elevate legs when you can ?- continue to weigh daily and write weight in diary- weigh same time each day on flat, hard surface ?- follow low salt diet- read labels, avoid salty snacks and gatorade ?- watch for swelling in feet, ankles and legs every day ?- take all medications as prescribed ?- check blood pressure daily and keep log ?- keep in touch with your doctor about weight gain, swelling ?COPD- ?- Continue to follow COPD rescue plan, be mindful of how you feel each day with emphasis on yellow zone ?- continue follow rescue plan if symptoms flare-up, call doctor early on if not feeling well ?- keep follow-up appointments  ?- take  all medications and use inhalers as prescribed, talk to pharmacist assigned to your case if any questions or concerns ?- practice energy conservation, alternate activity with rest  ?- call RN care manager for any questions at [phone] ?- continue checking blood sugar daily and recording in log ?Follow up with primary care doctor on 08/12/21 and venofer infusion on 08/05/21 ? ?Our next appointment is by telephone on 09/06/21 at 1045 am ? ?Please call the care guide team at [phone] if you need to cancel or reschedule your appointment.  ? ?If you are experiencing a Mental Health or [place] or need someone to talk to, please call the Suicide and Crisis Lifeline: 988 ?call the [place] National Suicide Prevention Lifeline: 952-183-5599 or TTY: 4455991059 TTY (504)481-0901) to talk to a trained counselor ?call 1-800-273-TALK (toll free, 24 hour hotline) ?go to [facility] [address] [place] [id number]) ?call the [facility]: [phone] ?call 911  ? ?Patient verbalizes understanding of instructions and care plan provided today and agrees to view in [place]. Active MyChart status confirmed with patient.   ? ?Jacqlyn Larsen RNC, BSN ?RN Case Manager ?[place] ?[phone] ? ?Hyperglycemia ?Hyperglycemia is when the sugar (glucose) level in your blood is too high. High blood sugar can happen to people who have or do not have diabetes. High blood sugar can happen quickly. It can be an emergency. ?What are the causes? ?If you have diabetes, high blood sugar may be caused by: ?Medicines that increase  blood sugar or affect your control of diabetes. ?Getting less physical activity. ?Overeating. ?Being sick or injured or having an infection. ?Having surgery. ?Stress. ?Not giving yourself enough insulin (if you are taking it). ?You may have high blood sugar because you have diabetes that has not been diagnosed yet. ?If you do not  have diabetes, high blood sugar may be caused by: ?Certain medicines. ?Stress. ?A bad illness. ?An infection. ?Having surgery. ?Diseases of the pancreas. ?What increases the risk? ?This condition is more likely to develop in people who have risk factors for diabetes, such as: ?Having a family member with diabetes. ?Certain conditions in which the body's defense system (immune system) attacks itself. These are called autoimmune disorders. ?Being overweight. ?Not being active. ?Having a condition called insulin resistance. ?Having a history of: ?Prediabetes. ?Diabetes when pregnant. ?Polycystic ovarian syndrome (PCOS). ?What are the signs or symptoms? ?This condition may not cause symptoms. If you do have symptoms, they may include: ?Feeling more thirsty than normal. ?Needing to pee (urinate) more often than normal. ?Hunger. ?Feeling very tired. ?Blurry eyesight (vision). ?You may get other symptoms as the condition gets worse, such as: ?Dry mouth. ?Pain in your belly (abdomen). ?Not being hungry (loss of appetite). ?Breath that smells fruity. ?Weakness. ?Weight loss that is not planned. ?A tingling or numb feeling in your hands or feet. ?A headache. ?Cuts or bruises that heal slowly. ?How is this treated? ?Treatment depends on the cause of your condition. Treatment may include: ?Taking medicine to control your blood sugar levels. ?Changing your medicine or dosage if you take insulin or other diabetes medicines. ?Lifestyle changes. These may include: ?Exercising more. ?Eating healthier foods. ?Losing weight. ?Treating an illness or infection. ?Checking your blood sugar more often. ?Stopping or reducing steroid medicines. ?If your condition gets very bad, you will need to be treated in the hospital. ?Follow these instructions at home: ?General instructions ?Take over-the-counter and prescription medicines only as told by your doctor. ?Do not smoke or use any products that contain nicotine or tobacco. If you need help  quitting, ask your doctor. ?If you drink alcohol: ?Limit how much you have to: ?0-1 drink a day for women who are not pregnant. ?0-2 drinks a day for men. ?Know how much alcohol is in a drink. In the [place] one drink equals one 12 oz bottle of beer (355 mL), one 5 oz glass of wine (148 mL), or one 1? oz glass of hard liquor (44 mL). ?Manage stress. If you need help with this, ask your doctor. ?Do exercises as told by your doctor. ?Keep all follow-up visits. ?Eating and drinking ? ?Stay at a healthy weight. ?Make sure you drink enough fluid when you: ?Exercise. ?Get sick. ?Are in hot temperatures. ?Drink enough fluid to keep your pee (urine) pale yellow. ?If you have diabetes: ? ?Know the symptoms of high blood sugar. ?Follow your diabetes management plan as told by your doctor. Make sure you: ?Take insulin and medicines as told. ?Follow your exercise plan. ?Follow your meal plan. Eat on time. Do not skip meals. ?Check your blood sugar as often as told. Make sure you check before and after exercise. If you exercise longer or in a different way, check your blood sugar more often. ?Follow your sick day plan whenever you cannot eat or drink normally. Make this plan ahead of time with your doctor. ?Share your diabetes management plan with people in your workplace, school, and household. ?Check your pee for ketones when you are ill  and as told by your doctor. ?Carry a card or wear jewelry that says that you have diabetes. ?Where to find more information ?American Diabetes Association: www.diabetes.org ?Contact a doctor if: ?Your blood sugar level is at or above 240 mg/dL (13.3 mmol/L) for 2 days in a row. ?You have problems keeping your blood sugar in your target range. ?You have high blood pressure often. ?You have signs of illness, such as: ?Feeling like you may vomit (feeling nauseous). ?Vomiting. ?A fever. ?Get help right away if: ?Your blood sugar monitor reads "high" even when you are taking insulin. ?You have  trouble breathing. ?You have a change in how you think, feel, or act (mental status). ?You feel like you may vomit, and the feeling does not go away. ?You cannot stop vomiting. ?These symptoms may be an eme

## 2021-08-03 ENCOUNTER — Other Ambulatory Visit: Payer: Self-pay | Admitting: Internal Medicine

## 2021-08-03 DIAGNOSIS — E1169 Type 2 diabetes mellitus with other specified complication: Secondary | ICD-10-CM

## 2021-08-04 DIAGNOSIS — D509 Iron deficiency anemia, unspecified: Secondary | ICD-10-CM | POA: Diagnosis not present

## 2021-08-04 LAB — CBC WITH DIFFERENTIAL/PLATELET
Absolute Monocytes: 496 cells/uL (ref 200–950)
Basophils Absolute: 44 cells/uL (ref 0–200)
Basophils Relative: 0.5 %
Eosinophils Absolute: 339 cells/uL (ref 15–500)
Eosinophils Relative: 3.9 %
HCT: 31.2 % — ABNORMAL LOW (ref 38.5–50.0)
Hemoglobin: 9.7 g/dL — ABNORMAL LOW (ref 13.2–17.1)
Lymphs Abs: 1766 cells/uL (ref 850–3900)
MCH: 25.6 pg — ABNORMAL LOW (ref 27.0–33.0)
MCHC: 31.1 g/dL — ABNORMAL LOW (ref 32.0–36.0)
MCV: 82.3 fL (ref 80.0–100.0)
MPV: 12.8 fL — ABNORMAL HIGH (ref 7.5–12.5)
Monocytes Relative: 5.7 %
Neutro Abs: 6055 cells/uL (ref 1500–7800)
Neutrophils Relative %: 69.6 %
Platelets: 201 10*3/uL (ref 140–400)
RBC: 3.79 10*6/uL — ABNORMAL LOW (ref 4.20–5.80)
RDW: 18.7 % — ABNORMAL HIGH (ref 11.0–15.0)
Total Lymphocyte: 20.3 %
WBC: 8.7 10*3/uL (ref 3.8–10.8)

## 2021-08-05 ENCOUNTER — Other Ambulatory Visit: Payer: Self-pay

## 2021-08-05 ENCOUNTER — Other Ambulatory Visit (HOSPITAL_COMMUNITY)
Admission: RE | Admit: 2021-08-05 | Discharge: 2021-08-05 | Disposition: A | Discharge status: Home / Self Care | Payer: Medicare HMO | Source: Ambulatory Visit | Attending: Student | Admitting: Student

## 2021-08-05 ENCOUNTER — Telehealth: Payer: Self-pay | Admitting: *Deleted

## 2021-08-05 ENCOUNTER — Other Ambulatory Visit: Payer: Self-pay | Admitting: *Deleted

## 2021-08-05 ENCOUNTER — Inpatient Hospital Stay (HOSPITAL_COMMUNITY): Payer: Medicare HMO

## 2021-08-05 VITALS — BP 97/55 | HR 77 | Temp 99.1°F | Resp 20

## 2021-08-05 DIAGNOSIS — Z79899 Other long term (current) drug therapy: Secondary | ICD-10-CM

## 2021-08-05 DIAGNOSIS — D509 Iron deficiency anemia, unspecified: Secondary | ICD-10-CM | POA: Diagnosis not present

## 2021-08-05 DIAGNOSIS — Z7901 Long term (current) use of anticoagulants: Secondary | ICD-10-CM | POA: Diagnosis not present

## 2021-08-05 DIAGNOSIS — Z87891 Personal history of nicotine dependence: Secondary | ICD-10-CM | POA: Diagnosis not present

## 2021-08-05 DIAGNOSIS — D631 Anemia in chronic kidney disease: Secondary | ICD-10-CM | POA: Diagnosis not present

## 2021-08-05 DIAGNOSIS — N189 Chronic kidney disease, unspecified: Secondary | ICD-10-CM | POA: Diagnosis not present

## 2021-08-05 DIAGNOSIS — I480 Paroxysmal atrial fibrillation: Secondary | ICD-10-CM | POA: Diagnosis not present

## 2021-08-05 DIAGNOSIS — E559 Vitamin D deficiency, unspecified: Secondary | ICD-10-CM | POA: Diagnosis not present

## 2021-08-05 DIAGNOSIS — Z86711 Personal history of pulmonary embolism: Secondary | ICD-10-CM | POA: Diagnosis not present

## 2021-08-05 LAB — BASIC METABOLIC PANEL
Anion gap: 7 (ref 5–15)
BUN: 19 mg/dL (ref 6–20)
CO2: 30 mmol/L (ref 22–32)
Calcium: 8.5 mg/dL — ABNORMAL LOW (ref 8.9–10.3)
Chloride: 101 mmol/L (ref 98–111)
Creatinine, Ser: 1.29 mg/dL — ABNORMAL HIGH (ref 0.61–1.24)
GFR, Estimated: >60 mL/min (ref >60)
Glucose, Bld: 122 mg/dL — ABNORMAL HIGH (ref 70–99)
Potassium: 3.7 mmol/L (ref 3.5–5.1)
Sodium: 138 mmol/L (ref 135–145)

## 2021-08-05 MED ORDER — LORATADINE 10 MG PO TABS
10.0000 mg | ORAL_TABLET | Freq: Once | ORAL | Status: AC
  Administered 2021-08-05: 10 mg via ORAL
  Filled 2021-08-05: qty 1

## 2021-08-05 MED ORDER — SODIUM CHLORIDE 0.9 % IV SOLN: Freq: Once | INTRAVENOUS | Status: AC

## 2021-08-05 MED ORDER — ACETAMINOPHEN 325 MG PO TABS
650.0000 mg | ORAL_TABLET | Freq: Once | ORAL | Status: AC
  Administered 2021-08-05: 650 mg via ORAL
  Filled 2021-08-05: qty 2

## 2021-08-05 MED ORDER — SODIUM CHLORIDE 0.9 % IV SOLN
300.0000 mg | Freq: Once | INTRAVENOUS | Status: AC
  Administered 2021-08-05: 300 mg via INTRAVENOUS
  Filled 2021-08-05: qty 300

## 2021-08-05 NOTE — Progress Notes (Signed)
Chaplain engaged in an initial visit with Reginald [name], introducing herself and explaining her role.  Reginald [name] initially and jokingly stated, "Am I dying?" Chaplain further explained why she was there.   ? ?Chaplain was able to join Reginald [name] in a communal conversation happening with another patient in the room.  ? ?Chaplain offered support and community throughout his time of getting infusions.  ? ? ? 08/05/21 1100  ?Clinical Encounter Type  ?Visited With Patient  ?Visit Type Initial;Spiritual support  ? ? ?

## 2021-08-05 NOTE — Telephone Encounter (Signed)
-----   Message from Erma Heritage, [place] sent at 08/05/2021  4:43 PM EDT ----- ?Please let the patient know his kidney function has continued to improve as creatinine is further down to 1.29. Electrolytes WNL. Given his weight gain, increase Torsemide from '40mg'$  daily to '60mg'$  daily until his weight returns to baseline. Repeat BMET again in 2-3 weeks.  ?

## 2021-08-05 NOTE — Patient Instructions (Signed)
[facility]  Discharge Instructions: Thank you for choosing [facility] to provide your oncology and hematology care.  If you have a lab appointment with the Cancer Center, please come in thru the Main Entrance and check in at the main information desk.  Wear comfortable clothing and clothing appropriate for easy access to any Portacath or PICC line.   We strive to give you quality time with your provider. You may need to reschedule your appointment if you arrive late (15 or more minutes).  Arriving late affects you and other patients whose appointments are after yours.  Also, if you miss three or more appointments without notifying the office, you may be dismissed from the clinic at the provider's discretion.      For prescription refill requests, have your pharmacy contact our office and allow 72 hours for refills to be completed.    Today you received the following chemotherapy and/or immunotherapy agents Venofer      To help prevent nausea and vomiting after your treatment, we encourage you to take your nausea medication as directed.  BELOW ARE SYMPTOMS THAT SHOULD BE REPORTED IMMEDIATELY: *FEVER GREATER THAN 100.4 F (38 C) OR HIGHER *CHILLS OR SWEATING *NAUSEA AND VOMITING THAT IS NOT CONTROLLED WITH YOUR NAUSEA MEDICATION *UNUSUAL SHORTNESS OF BREATH *UNUSUAL BRUISING OR BLEEDING *URINARY PROBLEMS (pain or burning when urinating, or frequent urination) *BOWEL PROBLEMS (unusual diarrhea, constipation, pain near the anus) TENDERNESS IN MOUTH AND THROAT WITH OR WITHOUT PRESENCE OF ULCERS (sore throat, sores in mouth, or a toothache) UNUSUAL RASH, SWELLING OR PAIN  UNUSUAL VAGINAL DISCHARGE OR ITCHING   Items with * indicate a potential emergency and should be followed up as soon as possible or go to the Emergency Department if any problems should occur.  Please show the CHEMOTHERAPY ALERT CARD or IMMUNOTHERAPY ALERT CARD at check-in to the Emergency  Department and triage nurse.  Should you have questions after your visit or need to cancel or reschedule your appointment, please contact [facility] [phone]  and follow the prompts.  Office hours are 8:00 a.m. to 4:30 p.m. Monday - Friday. Please note that voicemails left after 4:00 p.m. may not be returned until the following business day.  We are closed weekends and major holidays. You have access to a nurse at all times for urgent questions. Please call the main number to the clinic [phone] and follow the prompts.  For any non-urgent questions, you may also contact your provider using MyChart. We now offer e-Visits for anyone 18 and older to request care online for non-urgent symptoms. For details visit [email or web address].   Also download the MyChart app! Go to the app store, search "MyChart", open the app, select [place], and log in with your MyChart username and password.  Due to Covid, a mask is required upon entering the hospital/clinic. If you do not have a mask, one will be given to you upon arrival. For doctor visits, patients may have 1 support person aged 18 or older with them. For treatment visits, patients cannot have anyone with them due to current Covid guidelines and our immunocompromised population.  

## 2021-08-05 NOTE — Progress Notes (Signed)
Called to notify pt of the need to have lab work done today,.  ?

## 2021-08-05 NOTE — Telephone Encounter (Signed)
Pt notified and orders placed  

## 2021-08-05 NOTE — Progress Notes (Signed)
Patient presents today for Venofer infusion per providers order.  Vital signs WNL.  Patient has no new complaints at this time.  Peripheral IV started and blood return noted pre and post infusion.  Venofer infusion given today per MD orders.  Stable during infusion without adverse affects.  Vital signs stable.  No complaints at this time.  Discharge from clinic ambulatory in stable condition.  Alert and oriented X 3.  Follow up with [facility] as scheduled.  

## 2021-08-08 DIAGNOSIS — I5032 Chronic diastolic (congestive) heart failure: Secondary | ICD-10-CM | POA: Diagnosis not present

## 2021-08-08 DIAGNOSIS — N179 Acute kidney failure, unspecified: Secondary | ICD-10-CM | POA: Diagnosis not present

## 2021-08-08 DIAGNOSIS — N1831 Chronic kidney disease, stage 3a: Secondary | ICD-10-CM | POA: Diagnosis not present

## 2021-08-08 DIAGNOSIS — I440 Atrioventricular block, first degree: Secondary | ICD-10-CM

## 2021-08-08 DIAGNOSIS — I4510 Unspecified right bundle-branch block: Secondary | ICD-10-CM

## 2021-08-08 DIAGNOSIS — D500 Iron deficiency anemia secondary to blood loss (chronic): Secondary | ICD-10-CM | POA: Diagnosis not present

## 2021-08-08 DIAGNOSIS — K922 Gastrointestinal hemorrhage, unspecified: Secondary | ICD-10-CM | POA: Diagnosis not present

## 2021-08-08 DIAGNOSIS — E1122 Type 2 diabetes mellitus with diabetic chronic kidney disease: Secondary | ICD-10-CM | POA: Diagnosis not present

## 2021-08-08 DIAGNOSIS — I2510 Atherosclerotic heart disease of native coronary artery without angina pectoris: Secondary | ICD-10-CM

## 2021-08-08 DIAGNOSIS — I480 Paroxysmal atrial fibrillation: Secondary | ICD-10-CM | POA: Diagnosis not present

## 2021-08-08 DIAGNOSIS — R0789 Other chest pain: Secondary | ICD-10-CM | POA: Diagnosis not present

## 2021-08-08 DIAGNOSIS — I959 Hypotension, unspecified: Secondary | ICD-10-CM | POA: Diagnosis not present

## 2021-08-10 ENCOUNTER — Ambulatory Visit: Payer: Medicare HMO | Admitting: Podiatry

## 2021-08-12 ENCOUNTER — Encounter: Payer: Self-pay | Admitting: Internal Medicine

## 2021-08-12 ENCOUNTER — Telehealth: Payer: Self-pay | Admitting: *Deleted

## 2021-08-12 ENCOUNTER — Other Ambulatory Visit: Payer: Self-pay

## 2021-08-12 ENCOUNTER — Ambulatory Visit (INDEPENDENT_AMBULATORY_CARE_PROVIDER_SITE_OTHER): Payer: Medicare HMO | Admitting: Internal Medicine

## 2021-08-12 VITALS — BP 136/88 | HR 76 | Resp 18 | Ht 71.0 in | Wt >= 6400 oz

## 2021-08-12 DIAGNOSIS — I10 Essential (primary) hypertension: Secondary | ICD-10-CM | POA: Diagnosis not present

## 2021-08-12 DIAGNOSIS — J449 Chronic obstructive pulmonary disease, unspecified: Secondary | ICD-10-CM | POA: Diagnosis not present

## 2021-08-12 DIAGNOSIS — E861 Hypovolemia: Secondary | ICD-10-CM | POA: Diagnosis not present

## 2021-08-12 DIAGNOSIS — D649 Anemia, unspecified: Secondary | ICD-10-CM

## 2021-08-12 DIAGNOSIS — E1169 Type 2 diabetes mellitus with other specified complication: Secondary | ICD-10-CM | POA: Diagnosis not present

## 2021-08-12 DIAGNOSIS — I5032 Chronic diastolic (congestive) heart failure: Secondary | ICD-10-CM

## 2021-08-12 DIAGNOSIS — I9589 Other hypotension: Secondary | ICD-10-CM

## 2021-08-12 MED ORDER — METFORMIN HCL 850 MG PO TABS: 850.0000 mg | ORAL_TABLET | Freq: Every day | ORAL | 1 refills | Status: DC

## 2021-08-12 NOTE — Assessment & Plan Note (Signed)
BP Readings from Last 1 Encounters:  ?08/12/21 136/88  ? ?Well-controlled with Metoprolol and diuretics (for HFrEF) ?Reports episodes of hypotension at home, could be due to dehydration -although he has chronic LE swelling, his intravascular volume might be low ?Counseled for compliance with the medications ?Advised DASH diet and moderate exercise/walking as tolerated ?

## 2021-08-12 NOTE — Progress Notes (Signed)
? ?Established Patient Office Visit ? ?Subjective:  ?Patient ID: Reginald [name], male    DOB: 10/18/1970  Age: 51 y.o. MRN: [id number] ? ?CC:  ?Chief Complaint  ?Patient presents with  ? Follow-up  ?  3 month follow up HTN and COPD  ? ? ?HPI ?Reginald [name] is a 51 y.o. male with past medical history of hypertension, aortic stenosis s/p aortic valve replacement and repair of ascending thoracic aortic aneurysm, paroxysmal atrial fibrillation, chronic CHF, h/o pacemaker (complete heart block), OSA on CPAP, COPD and depression who presents for f/u of his chronic medical conditions. ? ?HTN, HFrEF and CKD: His BP is well controlled currently.  He reports that his BP has been running low at work, around 90s/60s at times.  He reports having dizziness at times as well.  Of note, he takes torsemide and metolazone for HFrEF.  He was recently admitted in the hospital for AKI on CKD, but his kidney function has improved since then with diuretics.  He still complains of exertional dyspnea, which is chronic.  Denies any chest pain currently. ? ?Type II DM: His blood glucose has been running around 150-190 at home since being discharged from the hospital.  Of note, his metformin was discontinued during recent hospitalization due to AKI on CKD.  He currently takes glipizide and Rybelsus, but is going to start Ozempic soon.  He is to get Ozempic samples through patient assistance program from the clinic today. ? ? ?Past Medical History:  ?Diagnosis Date  ? Anemia   ? Anxiety   ? Aortic stenosis   ? Arthritis   ? Asthma   ? Back pain   ? Cellulitis of skin with lymphangitis   ? CHF (congestive heart failure) ([place])   ? Chronic diastolic congestive heart failure ([place])   ? Chronic venous insufficiency   ? Constipation   ? COPD (chronic obstructive pulmonary disease) ([place])   ? Coronary artery disease with history of myocardial infarction without history of CABG   ? Depression   ? Diabetes ([place])   ? Dyspnea   ? Essential hypertension   ? Gout    ? Heart murmur   ? Hypertension   ? Morbid obesity ([place])   ? Obesity   ? Pneumonia   ? walking pneumonia  ? Prostatitis   ? Pulmonary embolism ([place])   ? Pulmonary hypertension ([place])   ? Pyelonephritis   ? S/P aortic valve replacement with bioprosthetic valve 08/23/2017  ? 25 mm Edwards Inspiris Resilia stented bovine pericardial tissue valve  ? 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 24 mm Hemashield supracoronary straight graft   ? Sleep apnea   ? cpap   ? Thoracic ascending aortic aneurysm   ? Thoracic ascending aortic aneurysm   ? Tobacco abuse   ? ? ?Past Surgical History:  ?Procedure Laterality Date  ? AORTIC VALVE REPLACEMENT N/A 08/23/2017  ? Procedure: AORTIC VALVE REPLACEMENT (AVR) USING INSPIRIS RESILIA AORTIC VALVE SIZE 25 MM;  Surgeon: Rexene Alberts, MD;  Location: [place];  Service: Open Heart Surgery;  Laterality: N/A;  ? CARDIAC VALVE REPLACEMENT N/A   ? Phreesia 02/07/2020  ? COLONOSCOPY WITH PROPOFOL N/A 11/14/2020  ? Procedure: COLONOSCOPY WITH PROPOFOL;  Surgeon: Gatha Mayer, MD;  Location: [facility] ENDOSCOPY;  Service: Endoscopy;  Laterality: N/A;  ? ESOPHAGOGASTRODUODENOSCOPY (EGD) WITH PROPOFOL N/A 11/14/2020  ? Procedure: ESOPHAGOGASTRODUODENOSCOPY (EGD) WITH PROPOFOL;  Surgeon: Gatha Mayer, MD;  Location: [facility] ENDOSCOPY;  Service: Endoscopy;  Laterality:  N/A;  ? GIVENS CAPSULE STUDY N/A 06/30/2021  ? Procedure: GIVENS CAPSULE STUDY;  Surgeon: Harvel Quale, MD;  [facility];  Service: Gastroenterology;  Laterality: N/A;  7:30  ? MULTIPLE EXTRACTIONS WITH ALVEOLOPLASTY N/A 07/23/2017  ? Procedure: Extraction of tooth #10 with alveoloplasty and gross debridement of remaining teeth;  Surgeon: Lenn Cal, DDS;  Location: [place];  Service: Oral Surgery;  Laterality: N/A;  ? NASAL SEPTOPLASTY W/ TURBINOPLASTY Bilateral 07/01/2021  ? Procedure: NASAL SEPTOPLASTY WITH TURBINATE REDUCTION;  Surgeon: Leta Baptist, MD;  Location: [facility];  Service: ENT;  Laterality:  Bilateral;  ? PACEMAKER IMPLANT N/A 08/28/2017  ?  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 TEE WITHOUT CARDIOVERSION N/A 07/16/2017  ? Procedure: TRANSESOPHAGEAL ECHOCARDIOGRAM (TEE);  Surgeon: Lelon Perla, MD;  Location: [facility] ENDOSCOPY;  Service: Cardiovascular;  Laterality: N/A;  ? TEE WITHOUT CARDIOVERSION N/A 08/23/2017  ? Procedure: TRANSESOPHAGEAL ECHOCARDIOGRAM (TEE);  Surgeon: Rexene Alberts, MD;  Location: [place];  Service: Open Heart Surgery;  Laterality: N/A;  ? THORACIC AORTIC ANEURYSM REPAIR N/A 08/23/2017  ? Procedure: THORACIC ASCENDING ANEURYSM REPAIR (AAA) USING HEMASHIELD GOLD KNITTED MICROVEL DOUBLE VELOUR VASCULAR GRAFT D: 24 MM  L: 30 CM;  Surgeon: Rexene Alberts, MD;  Location: [place];  Service: Open Heart Surgery;  Laterality: N/A;  ? ? ?Family History  ?Problem Relation Age of Onset  ? Hypertension Mother   ? Alzheimer's disease Mother   ? Heart attack Mother   ? Heart attack Brother   ? Diabetes Brother   ? ? ?Social History  ? ?Socioeconomic History  ? Marital status: Divorced  ?  Spouse name: Not on file  ? Number of children: Not on file  ? Years of education: Not on file  ? Highest education level: Not on file  ?Occupational History  ? Not on file  ?Tobacco Use  ? Smoking status: Former  ?  Packs/day: 2.00  ?  Years: 16.00  ?  Pack years: 32.00  ?  Types: Cigarettes  ?  Quit date: 08/23/2017  ?  Years since quitting: 3.9  ? Smokeless tobacco: Never  ?Vaping Use  ? Vaping Use: Never used  ?Substance and Sexual Activity  ? Alcohol use: No  ?  Comment: have had alcohol in the past, not heavy  ? Drug use: No  ? Sexual activity: Not Currently  ?  Partners: Female  ?Other Topics Concern  ? Not on file  ?Social History Narrative  ? Not on file  ? ?Social Determinants of Health  ? ?Financial Resource Strain: Low Risk   ? Difficulty of Paying Living Expenses: Not hard at all  ?Food Insecurity: No Food Insecurity  ?  Worried About Charity fundraiser in the Last Year: Never true  ? Ran Out of Food in the Last Year: Never true  ?Transportation Needs: No Transportation Needs  ? Lack of Transportation (Medical): No  ? Lack of Transportation (Non-Medical): No  ?Physical Activity: Inactive  ? Days of Exercise per Week: 0 days  ? Minutes of Exercise per Session: 0 min  ?Stress: No Stress Concern Present  ? Feeling of Stress : Only a little  ?Social Connections: Socially Isolated  ? Frequency of Communication with Friends and Family: Once a week  ? Frequency of Social Gatherings with Friends and Family: Never  ? Attends Religious Services: Never  ? Active Member of Clubs  or Organizations: No  ? Attends Archivist Meetings: More than 4 times per year  ? Marital Status: Never married  ?Intimate Partner Violence: Not At Risk  ? Fear of Current or Ex-Partner: No  ? Emotionally Abused: No  ? Physically Abused: No  ? Sexually Abused: No  ? ? ?Outpatient Medications Prior to Visit  ?Medication Sig Dispense Refill  ? acetaminophen (TYLENOL) 325 MG tablet Take 650 mg by mouth every 6 (six) hours as needed for mild pain.    ? albuterol (PROVENTIL) (2.5 MG/3ML) 0.083% nebulizer solution INHALE THE CONTENTS OF 1 VIAL VIA NEBULIZER EVERY 6 HOURS AS NEEDED FOR WHEEZING OR SHORTNESS OF BREATH 270 mL 1  ? albuterol (VENTOLIN HFA) 108 (90 Base) MCG/ACT inhaler INHALE 2 PUFFS INTO THE LUNGS EVERY 6 (SIX) HOURS AS NEEDED FOR WHEEZING OR SHORTNESS OF BREATH. 1 each 1  ? allopurinol (ZYLOPRIM) 300 MG tablet TAKE 1 TABLET EVERY DAY 90 tablet 0  ? atorvastatin (LIPITOR) 40 MG tablet TAKE 1 TABLET EVERY DAY 90 tablet 3  ? Blood Glucose Monitoring Suppl (TRUE METRIX METER) w/Device KIT USE AS DIRECTED 1 kit 0  ? buPROPion (WELLBUTRIN XL) 300 MG 24 hr tablet Take 1 tablet (300 mg total) by mouth daily. 90 tablet 3  ? Cholecalciferol 125 MCG (5000 UT) TABS Take 5,000 Units by mouth daily.    ? docusate sodium (COLACE) 100 MG capsule Take 2 capsules  (200 mg total) by mouth 2 (two) times daily. (Patient taking differently: Take 200 mg by mouth daily.) 30 capsule 0  ? FEROSUL 325 (65 Fe) MG tablet TAKE 1 TABLET EVERY DAY 90 tablet 1  ? fexofenadine (ALLEGRA)

## 2021-08-12 NOTE — Patient Instructions (Signed)
Please start taking Metformin once daily. ? ?Please start taking Ozempic instead of Rybelsus. ? ?Please follow low salt diet and ambulate as tolerated. ?

## 2021-08-12 NOTE — Assessment & Plan Note (Signed)
Dyspnea due to COPD, OHS and CHF ?On Torsemide and Metolazone ?Has an appointment with Cardiology ?

## 2021-08-12 NOTE — Assessment & Plan Note (Signed)
Needs to maintain adequate hydration ?His leg swelling most likely due to interstitial edema. ?

## 2021-08-12 NOTE — Telephone Encounter (Signed)
Pt received ozempic. Said he was unclear of how to use the medication and wanted to see chris as chris got this set up for him. Please advise  ?

## 2021-08-12 NOTE — Assessment & Plan Note (Signed)
Uses Albuterol PRN ?Stable for now, not on any maintenance inhaler ?

## 2021-08-12 NOTE — Assessment & Plan Note (Addendum)
Followed by heme-onc ?Gets iron transfusions now ?

## 2021-08-12 NOTE — Assessment & Plan Note (Signed)
Lab Results  ?Component Value Date  ? HGBA1C 5.7 (H) 06/28/2021  ? ?With peripheral neuropathy ?Was on Metformin and Rybelsus ?Restart metformin 850 mg only daily now ?Going to start Ozempic now instead of Rybelsus ?On Atorvastatin 40 mg QD ?

## 2021-08-15 ENCOUNTER — Ambulatory Visit (INDEPENDENT_AMBULATORY_CARE_PROVIDER_SITE_OTHER): Payer: Medicare HMO | Admitting: Gastroenterology

## 2021-08-16 ENCOUNTER — Encounter: Payer: Self-pay | Admitting: Internal Medicine

## 2021-08-16 ENCOUNTER — Inpatient Hospital Stay (HOSPITAL_COMMUNITY): Payer: Medicare HMO

## 2021-08-16 ENCOUNTER — Other Ambulatory Visit: Payer: Self-pay

## 2021-08-16 VITALS — BP 115/86 | HR 73 | Temp 98.5°F | Resp 20

## 2021-08-16 DIAGNOSIS — Z7901 Long term (current) use of anticoagulants: Secondary | ICD-10-CM | POA: Diagnosis not present

## 2021-08-16 DIAGNOSIS — Z79899 Other long term (current) drug therapy: Secondary | ICD-10-CM | POA: Diagnosis not present

## 2021-08-16 DIAGNOSIS — D509 Iron deficiency anemia, unspecified: Secondary | ICD-10-CM

## 2021-08-16 DIAGNOSIS — Z87891 Personal history of nicotine dependence: Secondary | ICD-10-CM | POA: Diagnosis not present

## 2021-08-16 DIAGNOSIS — N189 Chronic kidney disease, unspecified: Secondary | ICD-10-CM | POA: Diagnosis not present

## 2021-08-16 DIAGNOSIS — Z86711 Personal history of pulmonary embolism: Secondary | ICD-10-CM | POA: Diagnosis not present

## 2021-08-16 DIAGNOSIS — E559 Vitamin D deficiency, unspecified: Secondary | ICD-10-CM | POA: Diagnosis not present

## 2021-08-16 DIAGNOSIS — D631 Anemia in chronic kidney disease: Secondary | ICD-10-CM | POA: Diagnosis not present

## 2021-08-16 DIAGNOSIS — I480 Paroxysmal atrial fibrillation: Secondary | ICD-10-CM | POA: Diagnosis not present

## 2021-08-16 MED ORDER — ACETAMINOPHEN 325 MG PO TABS: 650.0000 mg | ORAL_TABLET | Freq: Once | ORAL | Status: DC

## 2021-08-16 MED ORDER — SODIUM CHLORIDE 0.9 % IV SOLN
300.0000 mg | Freq: Once | INTRAVENOUS | Status: AC
  Administered 2021-08-16: 300 mg via INTRAVENOUS
  Filled 2021-08-16: qty 300

## 2021-08-16 MED ORDER — LORATADINE 10 MG PO TABS: 10.0000 mg | ORAL_TABLET | Freq: Once | ORAL | Status: DC

## 2021-08-16 MED ORDER — SODIUM CHLORIDE 0.9 % IV SOLN: Freq: Once | INTRAVENOUS | Status: AC

## 2021-08-16 NOTE — Patient Instructions (Signed)
[facility]  Discharge Instructions: Thank you for choosing [facility] to provide your oncology and hematology care.  If you have a lab appointment with the Cancer Center, please come in thru the Main Entrance and check in at the main information desk.  Wear comfortable clothing and clothing appropriate for easy access to any Portacath or PICC line.   We strive to give you quality time with your provider. You may need to reschedule your appointment if you arrive late (15 or more minutes).  Arriving late affects you and other patients whose appointments are after yours.  Also, if you miss three or more appointments without notifying the office, you may be dismissed from the clinic at the provider's discretion.      For prescription refill requests, have your pharmacy contact our office and allow 72 hours for refills to be completed.    Today you received Venofer IV iron infusion.     BELOW ARE SYMPTOMS THAT SHOULD BE REPORTED IMMEDIATELY: *FEVER GREATER THAN 100.4 F (38 C) OR HIGHER *CHILLS OR SWEATING *NAUSEA AND VOMITING THAT IS NOT CONTROLLED WITH YOUR NAUSEA MEDICATION *UNUSUAL SHORTNESS OF BREATH *UNUSUAL BRUISING OR BLEEDING *URINARY PROBLEMS (pain or burning when urinating, or frequent urination) *BOWEL PROBLEMS (unusual diarrhea, constipation, pain near the anus) TENDERNESS IN MOUTH AND THROAT WITH OR WITHOUT PRESENCE OF ULCERS (sore throat, sores in mouth, or a toothache) UNUSUAL RASH, SWELLING OR PAIN  UNUSUAL VAGINAL DISCHARGE OR ITCHING   Items with * indicate a potential emergency and should be followed up as soon as possible or go to the Emergency Department if any problems should occur.  Please show the CHEMOTHERAPY ALERT CARD or IMMUNOTHERAPY ALERT CARD at check-in to the Emergency Department and triage nurse.  Should you have questions after your visit or need to cancel or reschedule your appointment, please contact [facility]  [phone]  and follow the prompts.  Office hours are 8:00 a.m. to 4:30 p.m. Monday - Friday. Please note that voicemails left after 4:00 p.m. may not be returned until the following business day.  We are closed weekends and major holidays. You have access to a nurse at all times for urgent questions. Please call the main number to the clinic [phone] and follow the prompts.  For any non-urgent questions, you may also contact your provider using MyChart. We now offer e-Visits for anyone 18 and older to request care online for non-urgent symptoms. For details visit [email or web address].   Also download the MyChart app! Go to the app store, search "MyChart", open the app, select [place], and log in with your MyChart username and password.  Due to Covid, a mask is required upon entering the hospital/clinic. If you do not have a mask, one will be given to you upon arrival. For doctor visits, patients may have 1 support person aged 18 or older with them. For treatment visits, patients cannot have anyone with them due to current Covid guidelines and our immunocompromised population.  

## 2021-08-16 NOTE — Progress Notes (Signed)
Pt presents today for Venofer IV iron per provider's order. Vital signs stable. Pt took pre-meds Tylenol and Allegra at home prior to arrival. ? ?Peripheral IV started with good blood return pre and post infusion. ? ?Venofer '300mg'$   given today per MD orders. Tolerated infusion without adverse affects. Vital signs stable. No complaints at this time. Discharged from clinic via wheelchair in stable condition. Alert and oriented x 3. F/U with [facility] as scheduled.   ?

## 2021-08-17 ENCOUNTER — Ambulatory Visit: Payer: Medicare HMO | Admitting: Pharmacist

## 2021-08-17 DIAGNOSIS — J449 Chronic obstructive pulmonary disease, unspecified: Secondary | ICD-10-CM

## 2021-08-17 DIAGNOSIS — I10 Essential (primary) hypertension: Secondary | ICD-10-CM

## 2021-08-17 DIAGNOSIS — I5032 Chronic diastolic (congestive) heart failure: Secondary | ICD-10-CM

## 2021-08-17 DIAGNOSIS — F339 Major depressive disorder, recurrent, unspecified: Secondary | ICD-10-CM

## 2021-08-17 DIAGNOSIS — E1169 Type 2 diabetes mellitus with other specified complication: Secondary | ICD-10-CM

## 2021-08-17 DIAGNOSIS — F419 Anxiety disorder, unspecified: Secondary | ICD-10-CM

## 2021-08-17 DIAGNOSIS — I480 Paroxysmal atrial fibrillation: Secondary | ICD-10-CM

## 2021-08-17 NOTE — Chronic Care Management (AMB) (Signed)
? ? ?Chronic Care Management ?Pharmacy Note ? ?08/17/2021 ?Name:  Reginald [name] MRN:  [id number] DOB:  02-24-1971 ? ?Summary: ?General: ?Patient reports still having significant "shocks" and nerve pain and would like primary care provider to consider increasing dose of gabapentin ? ?Type 2 Diabetes ?Recent changes: metformin restarted last week by primary care provider at lower dose of metformin 850 mg by mouth daily ?Jardiance remains on hold per cardiology due to recent acute kidney injury ?Patient presents today to review instructions and injection technique to make therapeutic switch from Rybelsus to [place] ?Patient is approved for Ozempic through [employer] patient assistance program through 04/20/22 and Jardiance through [facility] patient assistance program through 05/21/22. ?Patient recently received one box of Ozempic 1 mg and three boxes of Ozempic 2 mg  ?Continue metformin 850 mg by mouth once daily ?Switch from semaglutide (Rybelsus) 14 mg by mouth daily (last dose of Rybelsus was on 08/14/21) to semaglutide (Ozempic) 1 mg subcutaneously once weekly x4 weeks then increase to 2 mg subcutaneously once weekly thereafter  ?Patient was demonstrated appropriate injection technique for Ozempic in office today and patient was able to successfully take first dose without issue under my supervision.  ?Discontinue glipizide to reduce risk of hypoglycemia ?Remain off Jardiance per cardiology. May consider restarting at some point. ? ?Hypertension ?Blood pressure under good control. Continue current medications ?Recent changes: lisinopril discontinued due to hypotension/acute kidney injury ? ?Heart failure with preserved ejection fraction ?Serum creatinine improved back near baseline ?Recent changes: lisinopril discontinued due to hypotension/acute kidney injury ?Taking medications as directed: no, patient reports taking torsemide 40-80 mg per day to maintain fluid status ?Most recent echocardiogram was March 2023. LVEF  was stable at 60-65%. ?Current home weights: fluctuating 480-490s ?Remain off Jardiance and lisinopril per cardiology ?Continue beta blocker and diuretics + potassium repletion per cardiology. Recommended that he discuss taking higher doses of diuretics to maintain weight with cardiology.  ? ?Overweight/Obesity ?Switch from semaglutide (Rybelsus) 14 mg by mouth daily to semaglutide (Ozempic) 1 mg subcutaneously once weekly x4 weeks then increase to 2 mg subcutaneously once weekly thereafter ? ?Depression and Anxiety ?Recent changes: hydroxyzine discontinued due to acute kidney injury ?Taking medications as directed: no, patient has restarted taking hydroxyzine 25 mg by mouth every 8 hours due to an increase in anxiety ?Since renal function has improved to near baseline, ok to continue hydroxyzine to keep anxiety under control ?Continue bupropion (Wellbutrin) 300 mg by mouth once daily and hydroxyzine 25 mg by mouth every 8 hours as needed for anxiety ? ?Subjective: ?Reginald [name] is an 51 y.o. year old male who is a primary patient of [name], MD.  The CCM team was consulted for assistance with disease management and care coordination needs.   ? ?Engaged with patient face to face for follow up visit in response to provider referral for pharmacy case management and/or care coordination services.  ? ?Consent to Services:  ?The patient was given information about Chronic Care Management services, agreed to services, and gave verbal consent prior to initiation of services.  Please see initial visit note for detailed documentation.  ? ?Patient Care Team: ?[name], MD as PCP - General (Internal Medicine) ?Thompson Grayer, MD as PCP - Electrophysiology (Cardiology) ?Arnoldo Lenis, MD as PCP - Cardiology (Cardiology) ?Kassie Mends, RN as [place] Management ?Beryle Lathe, [facility] (Pharmacist) ?Derek Jack, MD as Medical Oncologist (Hematology) ? ?Objective: ? ?Lab  Results  ?Component Value Date  ?  CREATININE 1.29 (H) 08/05/2021  ? CREATININE 1.41 (H) 07/26/2021  ? CREATININE 1.51 (H) 07/21/2021  ? ? ?Lab Results  ?Component Value Date  ? HGBA1C 5.7 (H) 06/28/2021  ? ?Last diabetic Eye exam: No results found for: HMDIABEYEEXA  ?Last diabetic Foot exam: No results found for: HMDIABFOOTEX  ? ?   ?Component Value Date/Time  ? CHOL 97 03/25/2021 0539  ? CHOL 123 06/10/2020 0837  ? TRIG 138 03/25/2021 0539  ? HDL 27 (L) 03/25/2021 0539  ? HDL 25 (L) 06/10/2020 0837  ? CHOLHDL 3.6 03/25/2021 0539  ? VLDL 28 03/25/2021 0539  ? [place] 42 03/25/2021 0539  ? [place] 54 06/10/2020 0837  ? ? ? ?  Latest Ref Rng & Units 07/26/2021  ? 12:29 PM 07/19/2021  ?  6:15 PM 07/05/2021  ?  1:08 PM  ?Hepatic Function  ?Total Protein 6.5 - 8.1 g/dL 8.6   8.2   7.2    ?Albumin 3.5 - 5.0 g/dL 4.0   3.7   4.0    ?AST 15 - 41 U/L 51   36   43    ?ALT 0 - 44 U/L _0 ?Alk Phosphatase 38 - 126 U/L 71   71   90    ?Total Bilirubin 0.3 - 1.2 mg/dL 1.6   0.7   1.0    ?Bilirubin, Direct 0.0 - 0.2 mg/dL  0.2     ? ? ?Lab Results  ?Component Value Date/Time  ? TSH 3.920 05/13/2021 10:01 AM  ? TSH 4.060 10/04/2020 09:43 AM  ? FREET4 1.30 10/04/2020 09:43 AM  ? FREET4 0.79 (L) 05/03/2018 02:04 PM  ? ? ? ?  Latest Ref Rng & Units 08/04/2021  ? 10:50 AM 07/29/2021  ? 12:40 PM 07/26/2021  ? 12:29 PM  ?CBC  ?WBC 3.8 - 10.8 Thousand/uL 8.7   10.9   11.2    ?Hemoglobin 13.2 - 17.1 g/dL 9.7   9.9   10.0    ?Hematocrit 38.5 - 50.0 % 31.2   32.5   32.2    ?Platelets 140 - 400 Thousand/uL 201   254   299    ? ? ?Lab Results  ?Component Value Date/Time  ? VD25OH 24.8 (L) 05/13/2021 10:01 AM  ? ? ?Clinical ASCVD: No  ?The ASCVD Risk score (Arnett DK, et al., 2019) failed to calculate for the following reasons: ?  The valid total cholesterol range is 130 to 320 mg/dL   ? ?Social History  ? ?Tobacco Use  ?Smoking Status Former  ? Packs/day: 2.00  ? Years: 16.00  ? Pack years: 32.00  ? Types: Cigarettes  ? Quit date: 08/23/2017   ? Years since quitting: 3.9  ?Smokeless Tobacco Never  ? ?BP Readings from Last 3 Encounters:  ?08/16/21 115/86  ?08/12/21 136/88  ?08/05/21 (!) 97/55  ? ?Pulse Readings from Last 3 Encounters:  ?08/16/21 73  ?08/12/21 76  ?08/05/21 77  ? ?Wt Readings from Last 3 Encounters:  ?08/12/21 (!) 495 lb 3.2 oz (224.6 kg)  ?07/29/21 (!) 490 lb 1.6 oz (222.3 kg)  ?07/29/21 (!) 487 lb 9.6 oz (221.2 kg)  ? ? ?Assessment: Review of patient past medical history, allergies, medications, health status, including review of consultants reports, laboratory and other test data, was performed as part of comprehensive evaluation and provision of chronic care management services.  ? ?SDOH:  (Social Determinants of Health) assessments and interventions performed:  ? ? ?CCM  Care Plan ? ?No Known Allergies ? ?Medications Reviewed Today   ? ? Reviewed by Beryle Lathe, [facility] (Pharmacist) on 08/17/21 at [place] List Status: <None>  ? ?Medication Order Taking? Sig Documenting Provider Last Dose Status Informant  ?acetaminophen (TYLENOL) 325 MG tablet [id number] Yes Take 650 mg by mouth every 6 (six) hours as needed for mild pain. [provider] Taking Active Self  ?albuterol (PROVENTIL) (2.5 MG/3ML) 0.083% nebulizer solution [id number] Yes INHALE THE CONTENTS OF 1 VIAL VIA NEBULIZER EVERY 6 HOURS AS NEEDED FOR WHEEZING OR SHORTNESS OF BREATH [name], MD Taking Active   ?albuterol (VENTOLIN HFA) 108 (90 Base) MCG/ACT inhaler [id number] Yes INHALE 2 PUFFS INTO THE LUNGS EVERY 6 (SIX) HOURS AS NEEDED FOR WHEEZING OR SHORTNESS OF BREATH. [name], MD Taking Active Self  ?allopurinol (ZYLOPRIM) 300 MG tablet [id number] Yes TAKE 1 TABLET EVERY DAY [name], MD Taking Active Self  ?atorvastatin (LIPITOR) 40 MG tablet [id number] Yes TAKE 1 TABLET EVERY DAY [name], MD Taking Active Self  ?Blood Glucose Monitoring Suppl (TRUE METRIX METER) w/Device KIT [id number] Yes USE AS DIRECTED [name], MD Taking  Active Self  ?buPROPion (WELLBUTRIN XL) 300 MG 24 hr tablet [id number] Yes Take 1 tablet (300 mg total) by mouth daily. [name], MD Taking Active Self  ?Cholecalciferol 125 MCG (5000 UT) TABS 3

## 2021-08-17 NOTE — Patient Instructions (Signed)
Reginald [name], ? ?It was great to talk to you today! ? ?Please call me with any questions or concerns. ? ?Visit Information ? ?Following are the goals we discussed today:  ? Goals Addressed   ? ?  ?  ?  ?  ? This Visit's Progress  ?  Medication Management     ?  Patient Goals/Self-Care Activities ?Patient will:  ?Take medications as prescribed ?Check glucose once daily (fasting), document, and provide at future appointments ?Check blood pressure at least once daily, document, and provide at future appointments ?Weigh daily, and contact provider if weight gain of 3 lbs in a day or more than 5 lbs in a week ? ? ? ? ? ? ?  ? ?  ?  ? ?Follow-up plan: Telephone follow up appointment with care management team member scheduled for:  09/06/21 ? ?Patient verbalizes understanding of instructions and care plan provided today and agrees to view in [place]. Active MyChart status confirmed with patient.   ? ?Please call the care guide team at [phone] if you need to cancel or reschedule your appointment.  ? ?Kennon Holter, PharmD, BCACP, CPP ?Clinical Pharmacist Practitioner ?[place] ?[phone]  ?

## 2021-08-19 DIAGNOSIS — I480 Paroxysmal atrial fibrillation: Secondary | ICD-10-CM | POA: Diagnosis not present

## 2021-08-19 DIAGNOSIS — J449 Chronic obstructive pulmonary disease, unspecified: Secondary | ICD-10-CM

## 2021-08-19 DIAGNOSIS — E1169 Type 2 diabetes mellitus with other specified complication: Secondary | ICD-10-CM | POA: Diagnosis not present

## 2021-08-19 DIAGNOSIS — I10 Essential (primary) hypertension: Secondary | ICD-10-CM | POA: Diagnosis not present

## 2021-08-19 DIAGNOSIS — F339 Major depressive disorder, recurrent, unspecified: Secondary | ICD-10-CM

## 2021-08-19 DIAGNOSIS — I5032 Chronic diastolic (congestive) heart failure: Secondary | ICD-10-CM

## 2021-08-23 ENCOUNTER — Telehealth: Payer: Self-pay

## 2021-08-23 ENCOUNTER — Other Ambulatory Visit: Payer: Self-pay | Admitting: Internal Medicine

## 2021-08-23 DIAGNOSIS — G609 Hereditary and idiopathic neuropathy, unspecified: Secondary | ICD-10-CM

## 2021-08-23 NOTE — Telephone Encounter (Signed)
Reginald [name] from [facility] called [phone] regarding date of service 03.27.2023 unable to prove authorization durable medical equipment. Offer peer to peer and provider would like to go ahead with peer to peer offer call # [phone]  reference # [id number].  ?

## 2021-08-24 ENCOUNTER — Other Ambulatory Visit: Payer: Self-pay | Admitting: Internal Medicine

## 2021-08-24 ENCOUNTER — Ambulatory Visit (INDEPENDENT_AMBULATORY_CARE_PROVIDER_SITE_OTHER): Payer: Medicare HMO

## 2021-08-24 ENCOUNTER — Encounter: Payer: Self-pay | Admitting: Internal Medicine

## 2021-08-24 DIAGNOSIS — M1A9XX0 Chronic gout, unspecified, without tophus (tophi): Secondary | ICD-10-CM

## 2021-08-24 DIAGNOSIS — G609 Hereditary and idiopathic neuropathy, unspecified: Secondary | ICD-10-CM

## 2021-08-24 DIAGNOSIS — I442 Atrioventricular block, complete: Secondary | ICD-10-CM | POA: Diagnosis not present

## 2021-08-24 LAB — CUP PACEART REMOTE DEVICE CHECK
Battery Remaining Longevity: 87 mo
Battery Remaining Percentage: 70 %
Battery Voltage: 3.01 V
Brady Statistic AP VP Percent: 1 %
Brady Statistic AP VS Percent: 1 %
Brady Statistic AS VP Percent: 1 %
Brady Statistic AS VS Percent: 99 %
Brady Statistic RA Percent Paced: 1 %
Brady Statistic RV Percent Paced: 1 %
Date Time Interrogation Session: 20230405024543
Implantable Lead Implant Date: 20190409
Implantable Lead Implant Date: 20190409
Implantable Lead Location: 753859
Implantable Lead Location: 753860
Implantable Pulse Generator Implant Date: 20190409
Lead Channel Impedance Value: 540 Ohm
Lead Channel Impedance Value: 590 Ohm
Lead Channel Pacing Threshold Amplitude: 0.5 V
Lead Channel Pacing Threshold Amplitude: 0.75 V
Lead Channel Pacing Threshold Pulse Width: 0.5 ms
Lead Channel Pacing Threshold Pulse Width: 0.5 ms
Lead Channel Sensing Intrinsic Amplitude: 5 mV
Lead Channel Sensing Intrinsic Amplitude: 6.4 mV
Lead Channel Setting Pacing Amplitude: 2 V
Lead Channel Setting Pacing Amplitude: 2.5 V
Lead Channel Setting Pacing Pulse Width: 0.5 ms
Lead Channel Setting Sensing Sensitivity: 1.5 mV
Pulse Gen Model: 2272
Pulse Gen Serial Number: 9010865

## 2021-08-24 MED ORDER — GABAPENTIN 300 MG PO CAPS: 300.0000 mg | ORAL_CAPSULE | Freq: Two times a day (BID) | ORAL | 0 refills | Status: DC

## 2021-08-25 ENCOUNTER — Inpatient Hospital Stay (HOSPITAL_COMMUNITY): Payer: Medicare HMO | Attending: Hematology

## 2021-08-25 VITALS — BP 141/79 | HR 68 | Temp 98.4°F | Resp 18

## 2021-08-25 DIAGNOSIS — Z79899 Other long term (current) drug therapy: Secondary | ICD-10-CM | POA: Insufficient documentation

## 2021-08-25 DIAGNOSIS — D509 Iron deficiency anemia, unspecified: Secondary | ICD-10-CM | POA: Diagnosis not present

## 2021-08-25 DIAGNOSIS — Z87891 Personal history of nicotine dependence: Secondary | ICD-10-CM | POA: Insufficient documentation

## 2021-08-25 DIAGNOSIS — I480 Paroxysmal atrial fibrillation: Secondary | ICD-10-CM | POA: Diagnosis not present

## 2021-08-25 DIAGNOSIS — I5030 Unspecified diastolic (congestive) heart failure: Secondary | ICD-10-CM | POA: Diagnosis not present

## 2021-08-25 DIAGNOSIS — Z7901 Long term (current) use of anticoagulants: Secondary | ICD-10-CM | POA: Diagnosis not present

## 2021-08-25 DIAGNOSIS — G4730 Sleep apnea, unspecified: Secondary | ICD-10-CM | POA: Diagnosis not present

## 2021-08-25 DIAGNOSIS — Z86711 Personal history of pulmonary embolism: Secondary | ICD-10-CM | POA: Diagnosis not present

## 2021-08-25 DIAGNOSIS — I110 Hypertensive heart disease with heart failure: Secondary | ICD-10-CM | POA: Diagnosis not present

## 2021-08-25 MED ORDER — SODIUM CHLORIDE 0.9 % IV SOLN: Freq: Once | INTRAVENOUS | Status: AC

## 2021-08-25 MED ORDER — SODIUM CHLORIDE 0.9 % IV SOLN
400.0000 mg | Freq: Once | INTRAVENOUS | Status: AC
  Administered 2021-08-25: 400 mg via INTRAVENOUS
  Filled 2021-08-25: qty 20

## 2021-08-25 NOTE — Patient Instructions (Signed)
[facility]  Discharge Instructions: ?Thank you for choosing [facility] to provide your oncology and hematology care.  ?If you have a lab appointment with the [facility], please come in thru the Main Entrance and check in at the main information desk. ? ?Wear comfortable clothing and clothing appropriate for easy access to any Portacath or PICC line.  ? ?We strive to give you quality time with your provider. You may need to reschedule your appointment if you arrive late (15 or more minutes).  Arriving late affects you and other patients whose appointments are after yours.  Also, if you miss three or more appointments without notifying the office, you may be dismissed from the clinic at the provider?s discretion.    ?  ?For prescription refill requests, have your pharmacy contact our office and allow 72 hours for refills to be completed.   ? ?Today you received 400 mg Venofer IV iron infusion. ? ? ?BELOW ARE SYMPTOMS THAT SHOULD BE REPORTED IMMEDIATELY: ?*FEVER GREATER THAN 100.4 F (38 ?C) OR HIGHER ?*CHILLS OR SWEATING ?*NAUSEA AND VOMITING THAT IS NOT CONTROLLED WITH YOUR NAUSEA MEDICATION ?*UNUSUAL SHORTNESS OF BREATH ?*UNUSUAL BRUISING OR BLEEDING ?*URINARY PROBLEMS (pain or burning when urinating, or frequent urination) ?*BOWEL PROBLEMS (unusual diarrhea, constipation, pain near the anus) ?TENDERNESS IN MOUTH AND THROAT WITH OR WITHOUT PRESENCE OF ULCERS (sore throat, sores in mouth, or a toothache) ?UNUSUAL RASH, SWELLING OR PAIN  ?UNUSUAL VAGINAL DISCHARGE OR ITCHING  ? ?Items with * indicate a potential emergency and should be followed up as soon as possible or go to the Emergency Department if any problems should occur. ? ?Please show the CHEMOTHERAPY ALERT CARD or IMMUNOTHERAPY ALERT CARD at check-in to the Emergency Department and triage nurse. ? ?Should you have questions after your visit or need to cancel or reschedule your appointment, please contact [facility] [phone]  and follow the prompts.  Office hours are 8:00 a.m. to 4:30 p.m. Monday - Friday. Please note that voicemails left after 4:00 p.m. may not be returned until the following business day.  We are closed weekends and major holidays. You have access to a nurse at all times for urgent questions. Please call the main number to the clinic [phone] and follow the prompts. ? ?For any non-urgent questions, you may also contact your provider using MyChart. We now offer e-Visits for anyone 71 and older to request care online for non-urgent symptoms. For details visit [email or web address]. ?  ?Also download the MyChart app! Go to the app store, search "MyChart", open the app, select [facility], and log in with your MyChart username and password. ? ?Due to Covid, a mask is required upon entering the hospital/clinic. If you do not have a mask, one will be given to you upon arrival. For doctor visits, patients may have 1 support person aged 80 or older with them. For treatment visits, patients cannot have anyone with them due to current Covid guidelines and our immunocompromised population.  ?

## 2021-08-25 NOTE — Progress Notes (Signed)
Pt presents today for Venofer IV iron infusion per provider's order. Vital signs stable and pt voiced no new complaints at this time. Pt took pre-meds at home prior to arrival. ? ?Peripheral IV started with good blood return pre and post infusion. ? ?Venofer 400 mg given today per MD orders. Tolerated infusion without adverse affects. Vital signs stable. No complaints at this time. Discharged from clinic via wheelchair in stable condition. Alert and oriented x 3. F/U with [facility] as scheduled.   ?

## 2021-08-31 ENCOUNTER — Ambulatory Visit: Payer: Medicare HMO | Admitting: Urology

## 2021-08-31 DIAGNOSIS — D500 Iron deficiency anemia secondary to blood loss (chronic): Secondary | ICD-10-CM | POA: Diagnosis not present

## 2021-09-01 ENCOUNTER — Ambulatory Visit: Payer: Medicare HMO | Admitting: Pulmonary Disease

## 2021-09-05 ENCOUNTER — Ambulatory Visit: Payer: Medicare HMO | Admitting: Urology

## 2021-09-05 ENCOUNTER — Other Ambulatory Visit (HOSPITAL_COMMUNITY): Payer: Self-pay

## 2021-09-05 ENCOUNTER — Encounter: Payer: Self-pay | Admitting: Urology

## 2021-09-05 VITALS — BP 125/76 | HR 82

## 2021-09-05 DIAGNOSIS — R310 Gross hematuria: Secondary | ICD-10-CM | POA: Diagnosis not present

## 2021-09-05 DIAGNOSIS — N138 Other obstructive and reflux uropathy: Secondary | ICD-10-CM | POA: Diagnosis not present

## 2021-09-05 DIAGNOSIS — N401 Enlarged prostate with lower urinary tract symptoms: Secondary | ICD-10-CM

## 2021-09-05 DIAGNOSIS — D509 Iron deficiency anemia, unspecified: Secondary | ICD-10-CM

## 2021-09-05 MED ORDER — TAMSULOSIN HCL 0.4 MG PO CAPS: 0.4000 mg | ORAL_CAPSULE | Freq: Two times a day (BID) | ORAL | 11 refills | Status: AC

## 2021-09-05 NOTE — Progress Notes (Signed)
? ?Assessment: ?1. BPH with obstruction/lower urinary tract symptoms   ?2. Gross hematuria   ? ? ?Plan: ?Trial of tamsulosin 0.4 mg twice daily. ?Alfuzosin is contraindicated due to risk of possible QT prolongation. ?Return to office in 2 months ? ?Chief Complaint:  ?Chief Complaint  ?Patient presents with  ? Benign Prostatic Hypertrophy  ? ?History of Present Illness: ? ?Reginald [name] is a 51 y.o. year old male who is seen for further evaluation of gross hematuria and LUTS.  He reports a recent history of gross hematuria, dysuria, and urinary hesitancy.  His symptoms began in early November 2022.  He presented to the emergency room on 03/24/2021 and was found to be hypotensive.  Urinalysis showed >50 RBCs.  Urine culture showed no growth.  CT abdomen and pelvis without contrast from 03/24/2021 showed no renal abnormalities, no stones or hydronephrosis, normal-appearing bladder.  He was treated with antibiotics for possible UTI.  Following discharge, the gross hematuria resolved.  He continued to report some slight dysuria and hesitancy.  He also has urinary frequency and nocturia baseline.  No prior history of stones or UTIs. ?AUA score = 18. ? ?CT abdomen pelvis with contrast from 11/12/2020 showed no renal mass or obstruction. ?PSA 11/22:  1.9 ? ?He was scheduled for cystoscopy on 05/19/21.  This was postponed due to possible UTI. ?He noted onset of increased frequency, urgency, and dysuria.  No gross hematuria.  No fevers, chills, or flank pain.  He continued on tamsulosin. ?Urine culture results not available.  He was treated with Omnicef x7 days. ?He continued with some frequency, urgency, and nocturia. ?He continued on tamsulosin. ?IPSS = 15. ?Cystoscopy from 1/23 showed lateral lobe enlargement of the prostate but no urethral or bladder abnormalities. ? ?He returns today for follow-up.  No further gross hematuria.  No dysuria or flank pain.  He continues on tamsulosin.  He has urinary symptoms including  frequency, urgency, nocturia x5, and decreased force of stream. ?IPSS = 21 today. ? ?Portions of the above documentation were copied from a prior visit for review purposes only. ? ? ?Past Medical History:  ?Past Medical History:  ?Diagnosis Date  ? Anemia   ? Anxiety   ? Aortic stenosis   ? Arthritis   ? Asthma   ? Back pain   ? Cellulitis of skin with lymphangitis   ? CHF (congestive heart failure) ([place])   ? Chronic diastolic congestive heart failure ([place])   ? Chronic venous insufficiency   ? Constipation   ? COPD (chronic obstructive pulmonary disease) ([place])   ? Coronary artery disease with history of myocardial infarction without history of CABG   ? Depression   ? Diabetes ([place])   ? Dyspnea   ? Essential hypertension   ? Gout   ? Heart murmur   ? Hypertension   ? Morbid obesity ([place])   ? Obesity   ? Pneumonia   ? walking pneumonia  ? Prostatitis   ? Pulmonary embolism ([place])   ? Pulmonary hypertension ([place])   ? Pyelonephritis   ? S/P aortic valve replacement with bioprosthetic valve 08/23/2017  ? 25 mm Edwards Inspiris Resilia stented bovine pericardial tissue valve  ? 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 24 mm Hemashield supracoronary straight graft   ? Sleep apnea   ? cpap   ? Thoracic ascending aortic aneurysm   ? Thoracic ascending aortic aneurysm   ? Tobacco abuse   ? ? ?Past Surgical History:  ?Past Surgical  History:  ?Procedure Laterality Date  ? AORTIC VALVE REPLACEMENT N/A 08/23/2017  ? Procedure: AORTIC VALVE REPLACEMENT (AVR) USING INSPIRIS RESILIA AORTIC VALVE SIZE 25 MM;  Surgeon: Rexene Alberts, MD;  Location: [facility];  Service: Open Heart Surgery;  Laterality: N/A;  ? CARDIAC VALVE REPLACEMENT N/A   ? Phreesia 02/07/2020  ? COLONOSCOPY WITH PROPOFOL N/A 11/14/2020  ? Procedure: COLONOSCOPY WITH PROPOFOL;  Surgeon: Gatha Mayer, MD;  Location: [facility] ENDOSCOPY;  Service: Endoscopy;  Laterality: N/A;  ? ESOPHAGOGASTRODUODENOSCOPY (EGD) WITH PROPOFOL N/A 11/14/2020  ? Procedure:  ESOPHAGOGASTRODUODENOSCOPY (EGD) WITH PROPOFOL;  Surgeon: Gatha Mayer, MD;  Location: [facility] ENDOSCOPY;  Service: Endoscopy;  Laterality: N/A;  ? GIVENS CAPSULE STUDY N/A 06/30/2021  ? Procedure: GIVENS CAPSULE STUDY;  Surgeon: Harvel Quale, MD;  [facility];  Service: Gastroenterology;  Laterality: N/A;  7:30  ? MULTIPLE EXTRACTIONS WITH ALVEOLOPLASTY N/A 07/23/2017  ? Procedure: Extraction of tooth #10 with alveoloplasty and gross debridement of remaining teeth;  Surgeon: Lenn Cal, DDS;  Location: [facility];  Service: Oral Surgery;  Laterality: N/A;  ? NASAL SEPTOPLASTY W/ TURBINOPLASTY Bilateral 07/01/2021  ? Procedure: NASAL SEPTOPLASTY WITH TURBINATE REDUCTION;  Surgeon: Leta Baptist, MD;  Location: [facility];  Service: ENT;  Laterality: Bilateral;  ? PACEMAKER IMPLANT N/A 08/28/2017  ?  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 TEE WITHOUT CARDIOVERSION N/A 07/16/2017  ? Procedure: TRANSESOPHAGEAL ECHOCARDIOGRAM (TEE);  Surgeon: Lelon Perla, MD;  [facility] END[facility]COPY;  Service: Cardiovascular;  Laterality: N/A;  ? TEE WITHOUT CARDIOVERSION N/A 08/23/2017  ? Procedure: TRANSESOPHAGEAL ECHOCARDIOGRAM (TEE);  Surgeon: Rexene Alberts, MD;  Location: [facility];  Service: Open Heart Surgery;  Laterality: N/A;  ? THORACIC AORTIC ANEURYSM REPAIR N/A 08/23/2017  ? Procedure: THORACIC ASCENDING ANEURYSM REPAIR (AAA) USING HEMASHIELD GOLD KNITTED MICROVEL DOUBLE VELOUR VASCULAR GRAFT D: 24 MM  L: 30 CM;  Surgeon: Rexene Alberts, MD;  Location: [facility];  Service: Open Heart Surgery;  Laterality: N/A;  ? ? ?Allergies:  ?No Known Allergies ? ?Family History:  ?Family History  ?Problem Relation Age of Onset  ? Hypertension Mother   ? Alzheimer's disease Mother   ? Heart attack Mother   ? Heart attack Brother   ? Diabetes Brother   ? ? ?Social History:  ?Social History  ? ?Tobacco Use  ? Smoking status: Former  ?  Packs/day:  2.00  ?  Years: 16.00  ?  Pack years: 32.00  ?  Types: Cigarettes  ?  Quit date: 08/23/2017  ?  Years since quitting: 4.0  ? Smokeless tobacco: Never  ?Vaping Use  ? Vaping Use: Never used  ?Substance Use Topics  ? Alcohol use: No  ?  Comment: have had alcohol in the past, not heavy  ? Drug use: No  ? ?ROS: ?Constitutional:  Negative for fever, chills, weight loss ?CV: Negative for chest pain, previous MI, hypertension ?Respiratory:  Negative for shortness of breath, wheezing, sleep apnea, frequent cough ?GI:  Negative for nausea, vomiting, bloody stool, GERD ? ?Physical exam: ?BP 125/76   Pulse 82  ?GENERAL APPEARANCE:  Well appearing, well developed, well nourished, NAD ?HEENT:  Atraumatic, normocephalic, oropharynx clear ?NECK:  Supple without lymphadenopathy or thyromegaly ?ABDOMEN:  Soft, non-tender, no masses ?EXTREMITIES:  Moves all extremities well, without clubbing, cyanosis, or edema ?NEUROLOGIC:  Alert and oriented x 3, normal gait, CN II-XII grossly intact ?MENTAL STATUS:  appropriate ?BACK:  Non-tender to palpation, No CVAT ?SKIN:  Warm, dry, and intact ? ? ?Results: ?U/A: 0-2 RBCs ?

## 2021-09-06 ENCOUNTER — Telehealth: Payer: Medicare HMO

## 2021-09-06 ENCOUNTER — Inpatient Hospital Stay (HOSPITAL_COMMUNITY): Payer: Medicare HMO

## 2021-09-06 DIAGNOSIS — I110 Hypertensive heart disease with heart failure: Secondary | ICD-10-CM | POA: Diagnosis not present

## 2021-09-06 DIAGNOSIS — D509 Iron deficiency anemia, unspecified: Secondary | ICD-10-CM

## 2021-09-06 DIAGNOSIS — G4730 Sleep apnea, unspecified: Secondary | ICD-10-CM | POA: Diagnosis not present

## 2021-09-06 DIAGNOSIS — Z7901 Long term (current) use of anticoagulants: Secondary | ICD-10-CM | POA: Diagnosis not present

## 2021-09-06 DIAGNOSIS — Z86711 Personal history of pulmonary embolism: Secondary | ICD-10-CM | POA: Diagnosis not present

## 2021-09-06 DIAGNOSIS — I480 Paroxysmal atrial fibrillation: Secondary | ICD-10-CM | POA: Diagnosis not present

## 2021-09-06 DIAGNOSIS — I5030 Unspecified diastolic (congestive) heart failure: Secondary | ICD-10-CM | POA: Diagnosis not present

## 2021-09-06 DIAGNOSIS — Z87891 Personal history of nicotine dependence: Secondary | ICD-10-CM | POA: Diagnosis not present

## 2021-09-06 LAB — URINALYSIS, ROUTINE W REFLEX MICROSCOPIC
Bilirubin, UA: NEGATIVE
Glucose, UA: NEGATIVE
Ketones, UA: NEGATIVE
Leukocytes,UA: NEGATIVE
Nitrite, UA: NEGATIVE
Protein,UA: NEGATIVE
Specific Gravity, UA: 1.015 (ref 1.005–1.030)
Urobilinogen, Ur: 0.2 mg/dL (ref 0.2–1.0)
pH, UA: 6.5 (ref 5.0–7.5)

## 2021-09-06 LAB — CBC
HCT: 36.8 % — ABNORMAL LOW (ref 39.0–52.0)
Hemoglobin: 11.6 g/dL — ABNORMAL LOW (ref 13.0–17.0)
MCH: 26.3 pg (ref 26.0–34.0)
MCHC: 31.5 g/dL (ref 30.0–36.0)
MCV: 83.4 fL (ref 80.0–100.0)
Platelets: 250 10*3/uL (ref 150–400)
RBC: 4.41 MIL/uL (ref 4.22–5.81)
RDW: 18 % — ABNORMAL HIGH (ref 11.5–15.5)
WBC: 9.6 10*3/uL (ref 4.0–10.5)
nRBC: 0 % (ref 0.0–0.2)

## 2021-09-06 LAB — MICROSCOPIC EXAMINATION
Bacteria, UA: NONE SEEN
Renal Epithel, UA: NONE SEEN /hpf
WBC, UA: NONE SEEN /hpf (ref 0–5)

## 2021-09-06 LAB — IRON AND TIBC
Iron: 64 ug/dL (ref 45–182)
Saturation Ratios: 14 % — ABNORMAL LOW (ref 17.9–39.5)
TIBC: 469 ug/dL — ABNORMAL HIGH (ref 250–450)
UIBC: 405 ug/dL

## 2021-09-06 LAB — FERRITIN: Ferritin: 215 ng/mL (ref 24–336)

## 2021-09-07 ENCOUNTER — Other Ambulatory Visit: Payer: Self-pay | Admitting: Internal Medicine

## 2021-09-08 ENCOUNTER — Telehealth: Payer: Medicare HMO

## 2021-09-08 ENCOUNTER — Other Ambulatory Visit: Payer: Self-pay | Admitting: *Deleted

## 2021-09-08 DIAGNOSIS — F419 Anxiety disorder, unspecified: Secondary | ICD-10-CM

## 2021-09-08 MED ORDER — BUPROPION HCL ER (XL) 300 MG PO TB24: 300.0000 mg | ORAL_TABLET | Freq: Every day | ORAL | 3 refills | Status: AC

## 2021-09-08 NOTE — Progress Notes (Signed)
Remote pacemaker transmission.   

## 2021-09-12 NOTE — Progress Notes (Signed)
? ?[facility] ?[address] ?[place], [place] [address] ? ? ?CLINIC:  ?Medical Oncology/Hematology ? ?PCP:  ?[name], MD ?[address] ?[place] [address] ?[phone] ? ? ?REASON FOR VISIT:  ?Follow-up for normocytic anemia ? ?PRIOR THERAPY: Oral iron tablets ? ?CURRENT THERAPY: IV Venofer ? ?INTERVAL HISTORY:  ?Reginald [name] 51 y.o. male returns for routine follow-up of his normocytic anemia.  He was last seen by Dr. Delton Coombes on 07/29/2021. ? ?At today's visit, he reports feeling fair.  No recent hospitalizations, surgeries, or changes in baseline health status. ? ?He received IV iron with Venofer 1000 mg from 08/05/2021 through 08/25/2021.  He had some slight improvement in his energy after his IV iron, but reports that he still feels weak and has frequent episodes of lightheadedness with standing.  He continues to have possible melena, and describes "dark black diarrhea" about once a week.  He continues to take Xarelto.  He continues to have pica cravings for ice, which has been ongoing for the past 6 to 12 months.  He also reports restless legs, headaches, and palpitations.  He denies any chest pain, dyspnea on exertion, or syncopal episodes. ? ?He has 25% energy and 75% appetite. He endorses that he is maintaining a stable weight. ? ? ?REVIEW OF SYSTEMS:  ?Review of Systems  ?Constitutional:  Positive for fatigue. Negative for appetite change, chills, diaphoresis, fever and unexpected weight change.  ?HENT:   Negative for lump/mass and nosebleeds.   ?Eyes:  Negative for eye problems.  ?Respiratory:  Negative for cough, hemoptysis and shortness of breath.   ?Cardiovascular:  Positive for palpitations. Negative for chest pain and leg swelling.  ?Gastrointestinal:  Negative for abdominal pain, blood in stool, constipation, diarrhea, nausea and vomiting.  ?Genitourinary:  Positive for frequency. Negative for hematuria.   ?Skin: Negative.   ?Neurological:  Positive for dizziness, headaches and  numbness. Negative for light-headedness.  ?Hematological:  Does not bruise/bleed easily.  ?Psychiatric/Behavioral:  Positive for depression. The patient is nervous/anxious.    ? ? ?PAST MEDICAL/SURGICAL HISTORY:  ?Past Medical History:  ?Diagnosis Date  ? Anemia   ? Anxiety   ? Aortic stenosis   ? Arthritis   ? Asthma   ? Back pain   ? Cellulitis of skin with lymphangitis   ? CHF (congestive heart failure) ([place])   ? Chronic diastolic congestive heart failure ([place])   ? Chronic venous insufficiency   ? Constipation   ? COPD (chronic obstructive pulmonary disease) ([place])   ? Coronary artery disease with history of myocardial infarction without history of CABG   ? Depression   ? Diabetes ([place])   ? Dyspnea   ? Essential hypertension   ? Gout   ? Heart murmur   ? Hypertension   ? Morbid obesity ([place])   ? Obesity   ? Pneumonia   ? walking pneumonia  ? Prostatitis   ? Pulmonary embolism ([place])   ? Pulmonary hypertension ([place])   ? Pyelonephritis   ? S/P aortic valve replacement with bioprosthetic valve 08/23/2017  ? 25 mm Edwards Inspiris Resilia stented bovine pericardial tissue valve  ? 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 24 mm Hemashield supracoronary straight graft   ? Sleep apnea   ? cpap   ? Thoracic ascending aortic aneurysm ([place])   ? Thoracic ascending aortic aneurysm ([place])   ? Tobacco abuse   ? ?Past Surgical History:  ?Procedure Laterality Date  ? AORTIC VALVE REPLACEMENT N/A  08/23/2017  ? Procedure: AORTIC VALVE REPLACEMENT (AVR) USING INSPIRIS RESILIA AORTIC VALVE SIZE 25 MM;  Surgeon: Rexene Alberts, MD;  Location: [facility];  Service: Open Heart Surgery;  Laterality: N/A;  ? CARDIAC VALVE REPLACEMENT N/A   ? Phreesia 02/07/2020  ? COLONOSCOPY WITH PROPOFOL N/A 11/14/2020  ? Procedure: COLONOSCOPY WITH PROPOFOL;  Surgeon: Gatha Mayer, MD;  Location: [facility] ENDOSCOPY;  Service: Endoscopy;  Laterality: N/A;  ? ESOPHAGOGASTRODUODENOSCOPY (EGD) WITH PROPOFOL N/A 11/14/2020  ? Procedure:  ESOPHAGOGASTRODUODENOSCOPY (EGD) WITH PROPOFOL;  Surgeon: Gatha Mayer, MD;  Location: [facility]COPY;  Service: Endoscopy;  Laterality: N/A;  ? GIVENS CAPSULE STUDY N/A 06/30/2021  ? Procedure: GIVENS CAPSULE STUDY;  Surgeon: Harvel Quale, MD;  [facility];  Service: Gastroenterology;  Laterality: N/A;  7:30  ? MULTIPLE EXTRACTIONS WITH ALVEOLOPLASTY N/A 07/23/2017  ? Procedure: Extraction of tooth #10 with alveoloplasty and gross debridement of remaining teeth;  Surgeon: Lenn Cal, DDS;  Location: [facility];  Service: Oral Surgery;  Laterality: N/A;  ? NASAL SEPTOPLASTY W/ TURBINOPLASTY Bilateral 07/01/2021  ? Procedure: NASAL SEPTOPLASTY WITH TURBINATE REDUCTION;  Surgeon: Leta Baptist, MD;  Location: [facility];  Service: ENT;  Laterality: Bilateral;  ? PACEMAKER IMPLANT N/A 08/28/2017  ?  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 TEE WITHOUT CARDIOVERSION N/A 07/16/2017  ? Procedure: TRANSESOPHAGEAL ECHOCARDIOGRAM (TEE);  Surgeon: Lelon Perla, MD;  Location: [facility] ENDOSCOPY;  Service: Cardiovascular;  Laterality: N/A;  ? TEE WITHOUT CARDIOVERSION N/A 08/23/2017  ? Procedure: TRANSESOPHAGEAL ECHOCARDIOGRAM (TEE);  Surgeon: Rexene Alberts, MD;  Location: [facility];  Service: Open Heart Surgery;  Laterality: N/A;  ? THORACIC AORTIC ANEURYSM REPAIR N/A 08/23/2017  ? Procedure: THORACIC ASCENDING ANEURYSM REPAIR (AAA) USING HEMASHIELD GOLD KNITTED MICROVEL DOUBLE VELOUR VASCULAR GRAFT D: 24 MM  L: 30 CM;  Surgeon: Rexene Alberts, MD;  Location: [facility];  Service: Open Heart Surgery;  Laterality: N/A;  ? ? ? ?SOCIAL HISTORY:  ?Social History  ? ?Socioeconomic History  ? Marital status: Divorced  ?  Spouse name: Not on file  ? Number of children: Not on file  ? Years of education: Not on file  ? Highest education level: Not on file  ?Occupational History  ? Not on file  ?Tobacco Use  ? Smoking status: Former  ?  Packs/day:  2.00  ?  Years: 16.00  ?  Pack years: 32.00  ?  Types: Cigarettes  ?  Quit date: 08/23/2017  ?  Years since quitting: 4.0  ? Smokeless tobacco: Never  ?Vaping Use  ? Vaping Use: Never used  ?Substance and Sexual Activity  ? Alcohol use: No  ?  Comment: have had alcohol in the past, not heavy  ? Drug use: No  ? Sexual activity: Not Currently  ?  Partners: Female  ?Other Topics Concern  ? Not on file  ?Social History Narrative  ? Not on file  ? ?Social Determinants of Health  ? ?Financial Resource Strain: Low Risk   ? Difficulty of Paying Living Expenses: Not hard at all  ?Food Insecurity: No Food Insecurity  ? Worried About Charity fundraiser in the Last Year: Never true  ? Ran Out of Food in the Last Year: Never true  ?Transportation Needs: No Transportation Needs  ? Lack of Transportation (Medical): No  ? Lack of Transportation (Non-Medical): No  ?Physical Activity: Inactive  ? Days of  Exercise per Week: 0 days  ? Minutes of Exercise per Session: 0 min  ?Stress: No Stress Concern Present  ? Feeling of Stress : Only a little  ?Social Connections: Socially Isolated  ? Frequency of Communication with Friends and Family: Once a week  ? Frequency of Social Gatherings with Friends and Family: Never  ? Attends Religious Services: Never  ? Active Member of Clubs or Organizations: No  ? Attends Archivist Meetings: More than 4 times per year  ? Marital Status: Never married  ?Intimate Partner Violence: Not At Risk  ? Fear of Current or Ex-Partner: No  ? Emotionally Abused: No  ? Physically Abused: No  ? Sexually Abused: No  ? ? ?FAMILY HISTORY:  ?Family History  ?Problem Relation Age of Onset  ? Hypertension Mother   ? Alzheimer's disease Mother   ? Heart attack Mother   ? Heart attack Brother   ? Diabetes Brother   ? ? ?CURRENT MEDICATIONS:  ?Outpatient Encounter Medications as of 09/13/2021  ?Medication Sig Note  ? acetaminophen (TYLENOL) 325 MG tablet Take 650 mg by mouth every 6 (six) hours as needed for mild  pain.   ? albuterol (PROVENTIL) (2.5 MG/3ML) 0.083% nebulizer solution INHALE THE CONTENTS OF 1 VIAL VIA NEBULIZER EVERY 6 HOURS AS NEEDED FOR WHEEZING OR SHORTNESS OF BREATH   ? albuterol (VENTOLIN HFA) 108 (90 Base) MCG/ACT i

## 2021-09-13 ENCOUNTER — Inpatient Hospital Stay (HOSPITAL_COMMUNITY): Payer: Medicare HMO | Admitting: Physician Assistant

## 2021-09-13 VITALS — BP 130/84 | HR 84 | Temp 98.0°F | Resp 18 | Wt >= 6400 oz

## 2021-09-13 DIAGNOSIS — D509 Iron deficiency anemia, unspecified: Secondary | ICD-10-CM | POA: Diagnosis not present

## 2021-09-13 DIAGNOSIS — I480 Paroxysmal atrial fibrillation: Secondary | ICD-10-CM | POA: Diagnosis not present

## 2021-09-13 DIAGNOSIS — R790 Abnormal level of blood mineral: Secondary | ICD-10-CM | POA: Diagnosis not present

## 2021-09-13 DIAGNOSIS — R701 Abnormal plasma viscosity: Secondary | ICD-10-CM | POA: Diagnosis not present

## 2021-09-13 DIAGNOSIS — Z87891 Personal history of nicotine dependence: Secondary | ICD-10-CM | POA: Diagnosis not present

## 2021-09-13 DIAGNOSIS — Z7901 Long term (current) use of anticoagulants: Secondary | ICD-10-CM | POA: Diagnosis not present

## 2021-09-13 DIAGNOSIS — I5030 Unspecified diastolic (congestive) heart failure: Secondary | ICD-10-CM | POA: Diagnosis not present

## 2021-09-13 DIAGNOSIS — I110 Hypertensive heart disease with heart failure: Secondary | ICD-10-CM | POA: Diagnosis not present

## 2021-09-13 DIAGNOSIS — G4730 Sleep apnea, unspecified: Secondary | ICD-10-CM | POA: Diagnosis not present

## 2021-09-13 DIAGNOSIS — Z86711 Personal history of pulmonary embolism: Secondary | ICD-10-CM | POA: Diagnosis not present

## 2021-09-13 NOTE — Patient Instructions (Signed)
[facility] at [facility] ?Discharge Instructions ? ?You were seen today by Tarri Abernethy PA-C for your anemia.  We will check additional labs for follow-up of some abnormalities seen on your first labs.  I will see you for follow-up in 2 to 3 weeks to discuss these results.  We will decide at that time if you need to be scheduled for additional IV iron.   ? ? ?Thank you for choosing [place] at [facility] to provide your oncology and hematology care.  To afford each patient quality time with our provider, please arrive at least 15 minutes before your scheduled appointment time.  ? ?If you have a lab appointment with the [facility] please come in thru the Main Entrance and check in at the main information desk. ? ?You need to re-schedule your appointment should you arrive 10 or more minutes late.  We strive to give you quality time with our providers, and arriving late affects you and other patients whose appointments are after yours.  Also, if you no show three or more times for appointments you may be dismissed from the clinic at the providers discretion.     ?Again, thank you for choosing [facility].  Our hope is that these requests will decrease the amount of time that you wait before being seen by our physicians.       ?_____________________________________________________________ ? ?Should you have questions after your visit to [facility], please contact our office at [phone] and follow the prompts.  Our office hours are 8:00 a.m. and 4:30 p.m. Monday - Friday.  Please note that voicemails left after 4:00 p.m. may not be returned until the following business day.  We are closed weekends and major holidays.  You do have access to a nurse 24-7, just call the main number to the clinic [phone] and do not press any options, hold on the line and a nurse will answer the phone.   ? ?For prescription refill requests, have  your pharmacy contact our office and allow 72 hours.   ? ?Due to Covid, you will need to wear a mask upon entering the hospital. If you do not have a mask, a mask will be given to you at the Main Entrance upon arrival. For doctor visits, patients may have 1 support person age 75 or older with them. For treatment visits, patients can not have anyone with them due to social distancing guidelines and our immunocompromised population.  ? ? ? ?

## 2021-09-14 ENCOUNTER — Encounter (HOSPITAL_COMMUNITY): Payer: Self-pay

## 2021-09-14 ENCOUNTER — Inpatient Hospital Stay (HOSPITAL_COMMUNITY): Payer: Medicare HMO

## 2021-09-14 ENCOUNTER — Other Ambulatory Visit (HOSPITAL_COMMUNITY): Payer: Self-pay | Admitting: *Deleted

## 2021-09-14 DIAGNOSIS — Z87891 Personal history of nicotine dependence: Secondary | ICD-10-CM | POA: Diagnosis not present

## 2021-09-14 DIAGNOSIS — R701 Abnormal plasma viscosity: Secondary | ICD-10-CM

## 2021-09-14 DIAGNOSIS — I110 Hypertensive heart disease with heart failure: Secondary | ICD-10-CM | POA: Diagnosis not present

## 2021-09-14 DIAGNOSIS — I480 Paroxysmal atrial fibrillation: Secondary | ICD-10-CM | POA: Diagnosis not present

## 2021-09-14 DIAGNOSIS — I5030 Unspecified diastolic (congestive) heart failure: Secondary | ICD-10-CM | POA: Diagnosis not present

## 2021-09-14 DIAGNOSIS — G4730 Sleep apnea, unspecified: Secondary | ICD-10-CM | POA: Diagnosis not present

## 2021-09-14 DIAGNOSIS — R790 Abnormal level of blood mineral: Secondary | ICD-10-CM

## 2021-09-14 DIAGNOSIS — Z86711 Personal history of pulmonary embolism: Secondary | ICD-10-CM | POA: Diagnosis not present

## 2021-09-14 DIAGNOSIS — D509 Iron deficiency anemia, unspecified: Secondary | ICD-10-CM | POA: Diagnosis not present

## 2021-09-14 DIAGNOSIS — Z7901 Long term (current) use of anticoagulants: Secondary | ICD-10-CM | POA: Diagnosis not present

## 2021-09-14 LAB — CBC WITH DIFFERENTIAL/PLATELET
Abs Immature Granulocytes: 0.05 10*3/uL (ref 0.00–0.07)
Basophils Absolute: 0.1 10*3/uL (ref 0.0–0.1)
Basophils Relative: 1 %
Eosinophils Absolute: 0.3 10*3/uL (ref 0.0–0.5)
Eosinophils Relative: 3 %
HCT: 37.7 % — ABNORMAL LOW (ref 39.0–52.0)
Hemoglobin: 11.9 g/dL — ABNORMAL LOW (ref 13.0–17.0)
Immature Granulocytes: 1 %
Lymphocytes Relative: 22 %
Lymphs Abs: 2.1 10*3/uL (ref 0.7–4.0)
MCH: 26 pg (ref 26.0–34.0)
MCHC: 31.6 g/dL (ref 30.0–36.0)
MCV: 82.3 fL (ref 80.0–100.0)
Monocytes Absolute: 0.5 10*3/uL (ref 0.1–1.0)
Monocytes Relative: 5 %
Neutro Abs: 6.5 10*3/uL (ref 1.7–7.7)
Neutrophils Relative %: 68 %
Platelets: 276 10*3/uL (ref 150–400)
RBC: 4.58 MIL/uL (ref 4.22–5.81)
RDW: 17.6 % — ABNORMAL HIGH (ref 11.5–15.5)
WBC: 9.4 10*3/uL (ref 4.0–10.5)
nRBC: 0 % (ref 0.0–0.2)

## 2021-09-14 LAB — COMPREHENSIVE METABOLIC PANEL
ALT: 18 U/L (ref 0–44)
AST: 44 U/L — ABNORMAL HIGH (ref 15–41)
Albumin: 4 g/dL (ref 3.5–5.0)
Alkaline Phosphatase: 74 U/L (ref 38–126)
Anion gap: 12 (ref 5–15)
BUN: 30 mg/dL — ABNORMAL HIGH (ref 6–20)
CO2: 33 mmol/L — ABNORMAL HIGH (ref 22–32)
Calcium: 9.3 mg/dL (ref 8.9–10.3)
Chloride: 91 mmol/L — ABNORMAL LOW (ref 98–111)
Creatinine, Ser: 1.39 mg/dL — ABNORMAL HIGH (ref 0.61–1.24)
GFR, Estimated: >60 mL/min (ref >60)
Glucose, Bld: 152 mg/dL — ABNORMAL HIGH (ref 70–99)
Potassium: 2.4 mmol/L — CL (ref 3.5–5.1)
Sodium: 136 mmol/L (ref 135–145)
Total Bilirubin: 2 mg/dL — ABNORMAL HIGH (ref 0.3–1.2)
Total Protein: 9 g/dL — ABNORMAL HIGH (ref 6.5–8.1)

## 2021-09-14 LAB — RETICULOCYTES
Immature Retic Fract: 20.8 % — ABNORMAL HIGH (ref 2.3–15.9)
RBC.: 4.55 MIL/uL (ref 4.22–5.81)
Retic Count, Absolute: 225.2 10*3/uL — ABNORMAL HIGH (ref 19.0–186.0)
Retic Ct Pct: 5 % — ABNORMAL HIGH (ref 0.4–3.1)

## 2021-09-14 LAB — DIRECT ANTIGLOBULIN TEST (NOT AT ARMC)
DAT, IgG: NEGATIVE
DAT, complement: NEGATIVE

## 2021-09-14 LAB — LACTATE DEHYDROGENASE: LDH: 697 U/L — ABNORMAL HIGH (ref 98–192)

## 2021-09-14 LAB — BILIRUBIN, DIRECT: Bilirubin, Direct: 0.4 mg/dL — ABNORMAL HIGH (ref 0.0–0.2)

## 2021-09-14 NOTE — Progress Notes (Signed)
CRITICAL VALUE STICKER ? ?CRITICAL VALUE: Potassium 2.4 ? ?DATE & TIME NOTIFIED: 09/14/2021 @ 1353 ? ?MESSENGER (representative from lab): ? ?MD NOTIFIED: Tarri Abernethy, PA-C ? ?TIME OF NOTIFICATION: 5573 ? ?RESPONSE: He is on high-dose potassium per his PCP already (supposed to be taking 60 mEq daily).  Can you call him and make sure he is taking it?  If he is not taking it... Resume ASAP - should take 3 tablets (60 mEq) now and 2 more tablets (40 mEq) in 3 hours (total of 100 mEq today). ?If he is already taking his potassium as prescribed by his PCP, he should take 3 more tablets (60 mEq). ?Regardless, needs to call his PCP for further instructions and repeat CMP, since she is already managing his hypokalemia. ? ?Called patient and he is taking Potassium 60 mEq daily. Instructed him to take 3 more tablets (60 mEq) now and to call his PCP for further instructions. He states understanding and says that he will call his cardiologist since they are the ones who have been handling this. ? ?

## 2021-09-15 ENCOUNTER — Other Ambulatory Visit (HOSPITAL_COMMUNITY): Payer: Medicare HMO

## 2021-09-15 ENCOUNTER — Other Ambulatory Visit: Payer: Self-pay

## 2021-09-15 DIAGNOSIS — Z79899 Other long term (current) drug therapy: Secondary | ICD-10-CM

## 2021-09-15 LAB — PATHOLOGIST SMEAR REVIEW

## 2021-09-15 LAB — HAPTOGLOBIN: Haptoglobin: <10 mg/dL — ABNORMAL LOW (ref 23–355)

## 2021-09-15 LAB — CERULOPLASMIN: Ceruloplasmin: 39.1 mg/dL — ABNORMAL HIGH (ref 16.0–31.0)

## 2021-09-15 LAB — COPPER, SERUM: Copper: 193 ug/dL — ABNORMAL HIGH (ref 69–132)

## 2021-09-21 ENCOUNTER — Other Ambulatory Visit (HOSPITAL_COMMUNITY)
Admission: RE | Admit: 2021-09-21 | Discharge: 2021-09-21 | Disposition: A | Discharge status: Home / Self Care | Payer: Medicare HMO | Source: Ambulatory Visit | Attending: Student | Admitting: Student

## 2021-09-21 DIAGNOSIS — Z79899 Other long term (current) drug therapy: Secondary | ICD-10-CM | POA: Insufficient documentation

## 2021-09-21 LAB — BASIC METABOLIC PANEL
Anion gap: 11 (ref 5–15)
BUN: 23 mg/dL — ABNORMAL HIGH (ref 6–20)
CO2: 30 mmol/L (ref 22–32)
Calcium: 9.3 mg/dL (ref 8.9–10.3)
Chloride: 96 mmol/L — ABNORMAL LOW (ref 98–111)
Creatinine, Ser: 1.26 mg/dL — ABNORMAL HIGH (ref 0.61–1.24)
GFR, Estimated: >60 mL/min (ref >60)
Glucose, Bld: 95 mg/dL (ref 70–99)
Potassium: 3.5 mmol/L (ref 3.5–5.1)
Sodium: 137 mmol/L (ref 135–145)

## 2021-09-23 ENCOUNTER — Telehealth: Payer: Self-pay

## 2021-09-23 ENCOUNTER — Emergency Department (HOSPITAL_COMMUNITY)
Admission: EM | Admit: 2021-09-23 | Discharge: 2021-09-23 | Disposition: A | Discharge status: Home / Self Care | Payer: Medicare HMO | Attending: Student | Admitting: Student

## 2021-09-23 ENCOUNTER — Encounter (HOSPITAL_COMMUNITY): Payer: Self-pay

## 2021-09-23 ENCOUNTER — Other Ambulatory Visit: Payer: Self-pay

## 2021-09-23 DIAGNOSIS — I130 Hypertensive heart and chronic kidney disease with heart failure and stage 1 through stage 4 chronic kidney disease, or unspecified chronic kidney disease: Secondary | ICD-10-CM | POA: Diagnosis not present

## 2021-09-23 DIAGNOSIS — Z951 Presence of aortocoronary bypass graft: Secondary | ICD-10-CM | POA: Insufficient documentation

## 2021-09-23 DIAGNOSIS — I5033 Acute on chronic diastolic (congestive) heart failure: Secondary | ICD-10-CM | POA: Diagnosis not present

## 2021-09-23 DIAGNOSIS — Z955 Presence of coronary angioplasty implant and graft: Secondary | ICD-10-CM | POA: Diagnosis not present

## 2021-09-23 DIAGNOSIS — J45909 Unspecified asthma, uncomplicated: Secondary | ICD-10-CM | POA: Diagnosis not present

## 2021-09-23 DIAGNOSIS — R040 Epistaxis: Secondary | ICD-10-CM | POA: Insufficient documentation

## 2021-09-23 DIAGNOSIS — I2510 Atherosclerotic heart disease of native coronary artery without angina pectoris: Secondary | ICD-10-CM | POA: Diagnosis not present

## 2021-09-23 DIAGNOSIS — Z79899 Other long term (current) drug therapy: Secondary | ICD-10-CM | POA: Insufficient documentation

## 2021-09-23 DIAGNOSIS — E1140 Type 2 diabetes mellitus with diabetic neuropathy, unspecified: Secondary | ICD-10-CM | POA: Diagnosis not present

## 2021-09-23 DIAGNOSIS — Z7984 Long term (current) use of oral hypoglycemic drugs: Secondary | ICD-10-CM | POA: Diagnosis not present

## 2021-09-23 DIAGNOSIS — J449 Chronic obstructive pulmonary disease, unspecified: Secondary | ICD-10-CM | POA: Insufficient documentation

## 2021-09-23 DIAGNOSIS — Z7901 Long term (current) use of anticoagulants: Secondary | ICD-10-CM | POA: Diagnosis not present

## 2021-09-23 DIAGNOSIS — Z87891 Personal history of nicotine dependence: Secondary | ICD-10-CM | POA: Insufficient documentation

## 2021-09-23 DIAGNOSIS — N182 Chronic kidney disease, stage 2 (mild): Secondary | ICD-10-CM | POA: Insufficient documentation

## 2021-09-23 MED ORDER — OXYMETAZOLINE HCL 0.05 % NA SOLN
1.0000 | Freq: Once | NASAL | Status: AC
  Administered 2021-09-23: 1 via NASAL
  Filled 2021-09-23: qty 30

## 2021-09-23 NOTE — Telephone Encounter (Signed)
Will await order  ?

## 2021-09-23 NOTE — ED Provider Notes (Signed)
?[facility] ?Provider Note ? ?CSN: [id number] ?Arrival date & time: 09/23/21 0740 ? ?Chief Complaint(s) ?Epistaxis ? ?HPI ?Reginald [name] is a 51 y.o. male with PMH aortic stenosis, CHF, COPD, nasal septoplasty on 07/01/2021, previous PE on Eliquis who presents emergency department for evaluation of epistaxis.  Patient states that he awoke this morning with a abrupt onset left-sided nosebleed.  He states that he was temporarily able to stop it with direct pressure but it restarted bring him to the emergency department.  He denies any trauma to the nose, chest pain, shortness of breath, headache, fever or other systemic symptoms.  Patient states he did take his Eliquis this morning.  Patient arrives with active bleeding from the left nare. ? ? ?Past Medical History ?Past Medical History:  ?Diagnosis Date  ? Anemia   ? Anxiety   ? Aortic stenosis   ? Arthritis   ? Asthma   ? Back pain   ? Cellulitis of skin with lymphangitis   ? CHF (congestive heart failure) ([place])   ? Chronic diastolic congestive heart failure ([place])   ? Chronic venous insufficiency   ? Constipation   ? COPD (chronic obstructive pulmonary disease) ([place])   ? Coronary artery disease with history of myocardial infarction without history of CABG   ? Depression   ? Diabetes ([place])   ? Dyspnea   ? Essential hypertension   ? Gout   ? Heart murmur   ? Hypertension   ? Morbid obesity ([place])   ? Obesity   ? Pneumonia   ? walking pneumonia  ? Prostatitis   ? Pulmonary embolism ([place])   ? Pulmonary hypertension ([place])   ? Pyelonephritis   ? S/P aortic valve replacement with bioprosthetic valve 08/23/2017  ? 25 mm Edwards Inspiris Resilia stented bovine pericardial tissue valve  ? 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 24 mm Hemashield supracoronary straight graft   ? Sleep apnea   ? cpap   ? Thoracic ascending aortic aneurysm ([place])   ? Thoracic ascending aortic aneurysm ([place])   ? Tobacco abuse   ? ?Patient Active Problem List  ? Diagnosis Date  Noted  ? Symptomatic anemia   ? Acute renal failure superimposed on stage 2 chronic kidney disease ([place]) 07/19/2021  ? Chest pain 07/19/2021  ? Hypotension due to hypovolemia 07/19/2021  ? S/P nasal septoplasty 07/01/2021  ? Melena 06/13/2021  ? Wound cellulitis 06/09/2021  ? SOB (shortness of breath) 06/09/2021  ? Pain due to onychomycosis of toenails of both feet 05/02/2021  ? Diabetic neuropathy ([place]) 05/02/2021  ? Callus of foot 05/02/2021  ? BPH with obstruction/lower urinary tract symptoms 04/05/2021  ? Acute on chronic diastolic CHF (congestive heart failure) ([place]) 03/25/2021  ? Morbid obesity with BMI of 60.0-69.9, adult ([place]) 03/25/2021  ? Mixed hyperlipidemia 03/24/2021  ? Atrial fibrillation, chronic ([place]) 03/24/2021  ? Prolonged QT interval 03/24/2021  ? Iron deficiency anemia 02/21/2021  ? Constipation 02/21/2021  ? Chronic blood loss anemia 11/12/2020  ? Gastrointestinal hemorrhage 10/08/2020  ? S/P placement of cardiac pacemaker 02/10/2020  ? Encounter for examination following treatment at hospital 02/10/2020  ? Anxiety 02/10/2020  ? DM2 (diabetes mellitus, type 2) ([place]) 02/10/2020  ? OSA on CPAP   ? History of pulmonary embolism 03/16/2019  ? Paroxysmal atrial fibrillation ([place]) 09/05/2018  ? COPD (chronic obstructive pulmonary disease) ([place]) 05/07/2018  ? Gastroesophageal reflux disease   ? Pulmonary embolism ([place]) 09/18/2017  ? S/P aortic valve replacement with  bioprosthetic valve + repair ascending thoracic aortic aneurysm 08/23/2017  ? 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 Pulmonary hypertension ([place])   ? Chronic periodontitis 07/18/2017  ? Morbid obesity ([place])   ? Essential hypertension   ? Tobacco abuse   ? Chronic heart failure with preserved ejection fraction (HFpEF) ([place])   ? Chronic venous insufficiency   ? ?Home Medication(s) ?Prior to Admission medications   ?Medication Sig Start Date End Date Taking? Authorizing Provider  ?acetaminophen (TYLENOL) 325 MG tablet Take 650 mg by  mouth every 6 (six) hours as needed for mild pain.    [provider]  ?albuterol (PROVENTIL) (2.5 MG/3ML) 0.083% nebulizer solution INHALE THE CONTENTS OF 1 VIAL VIA NEBULIZER EVERY 6 HOURS AS NEEDED FOR WHEEZING OR SHORTNESS OF BREATH 07/13/21   [name], MD  ?albuterol (VENTOLIN HFA) 108 (90 Base) MCG/ACT inhaler INHALE 2 PUFFS INTO THE LUNGS EVERY 6 (SIX) HOURS AS NEEDED FOR WHEEZING OR SHORTNESS OF BREATH. 06/01/21   [name], MD  ?allopurinol (ZYLOPRIM) 300 MG tablet TAKE 1 TABLET EVERY DAY 08/24/21   [name], MD  ?atorvastatin (LIPITOR) 40 MG tablet TAKE 1 TABLET EVERY DAY 02/14/21   [name], MD  ?Blood Glucose Monitoring Suppl (TRUE METRIX METER) w/Device KIT USE AS DIRECTED 08/19/20   [name], MD  ?buPROPion (WELLBUTRIN XL) 300 MG 24 hr tablet Take 1 tablet (300 mg total) by mouth daily. 09/08/21   [name], MD  ?Cholecalciferol 125 MCG (5000 UT) TABS Take 5,000 Units by mouth daily.    [provider]  ?docusate sodium (COLACE) 100 MG capsule Take 2 capsules (200 mg total) by mouth 2 (two) times daily. ?Patient taking differently: Take 200 mg by mouth daily. 11/15/20   Thurnell Lose, MD  ?FEROSUL 325 (65 Fe) MG tablet TAKE 1 TABLET EVERY DAY 07/18/21   [name], MD  ?fexofenadine (ALLEGRA) 180 MG tablet Take 180 mg by mouth daily.    [provider]  ?gabapentin (NEURONTIN) 300 MG capsule Take 1 capsule (300 mg total) by mouth 2 (two) times daily. 08/24/21   [name], MD  ?hydrOXYzine (ATARAX) 25 MG tablet TAKE 1 TABLET BY MOUTH EVERY 8 HOURS AS NEEDED 09/07/21   [name], MD  ?Javier Docker Oil 350 MG CAPS Take 350 mg by mouth daily.    [provider]  ?metFORMIN (GLUCOPHAGE) 850 MG tablet Take 1 tablet (850 mg total) by mouth daily with breakfast. 08/12/21   [name], MD  ?metolazone (ZAROXOLYN) 5 MG tablet TAKE 1 TABLET (5 MG TOTAL) BY MOUTH THREE TIMES A WEEK. ?Patient taking differently: Take 5 mg by mouth See  admin instructions. TAKE 1 TABLET (5 MG TOTAL) BY MOUTH THREE TIMES A WEEK AS NEEDED 01/26/21   Allred, Jeneen Rinks, MD  ?metoprolol tartrate (LOPRESSOR) 100 MG tablet TAKE 1 TABLET TWICE DAILY 12/27/20   Josue Hector, MD  ?Misc. Devices MISC Sequential compression device - 1 pair. Apply them for 1 hour in the morning and 1 hour in the evening. 07/18/21   [name], MD  ?pantoprazole (PROTONIX) 40 MG tablet TAKE 1 TABLET EVERY DAY 05/05/21   [name], MD  ?potassium chloride SA (KLOR-CON) 20 MEQ tablet TAKE 3 TABS DAILY AND TAKE AN ADDITIONAL 1 TAB ON THE DAY YOU TAKE YOUR WEEKLY METOLAZONE DOSE 12/22/20   Ahmed Prima, Fransisco Hertz, PA-C  ?rivaroxaban (XARELTO) 20 MG TABS tablet Take 20 mg by  mouth daily. 07/28/21   [provider]  ?Semaglutide, 2 MG/DOSE, (OZEMPIC, 2 MG/DOSE,) 8 MG/3ML SOPN Inject 2 mg into the skin once a week. 09/14/21   [provider]  ?tamsulosin (FLOMAX) 0.4 MG CAPS capsule Take 1 capsule (0.4 mg total) by mouth 2 (two) times daily. 09/05/21   Stoneking, Reece Leader., MD  ?Tiotropium Bromide Monohydrate (SPIRIVA RESPIMAT) 2.5 MCG/ACT AERS Inhale 2 puffs into the lungs daily. 02/03/21   [name], MD  ?torsemide (DEMADEX) 20 MG tablet Take 2 tablets (40 mg total) by mouth daily for 30 doses. ?Patient taking differently: Take 40 mg by mouth 2 (two) times daily. 07/21/21 08/20/21  Deatra James, MD  ?TRUE METRIX BLOOD GLUCOSE TEST test strip TEST ONE TIME DAILY FOR DIABETES 10/25/20   [name], MD  ?TRUEplus Lancets 33G MISC USE TO TEST ONE TIME DAILY FOR DIABETES 10/25/20   [name], MD  ?TURMERIC-GINGER PO Take 2 each by mouth daily.    [provider]  ?                                                                                                                                  ?Past Surgical History ?Past Surgical History:  ?Procedure Laterality Date  ? AORTIC VALVE REPLACEMENT N/A 08/23/2017  ? Procedure: AORTIC VALVE REPLACEMENT (AVR) USING INSPIRIS  RESILIA AORTIC VALVE SIZE 25 MM;  Surgeon: Rexene Alberts, MD;  Location: [facility];  Service: Open Heart Surgery;  Laterality: N/A;  ? CARDIAC VALVE REPLACEMENT N/A   ? Phreesia 02/07/2020  ? COLONOSCOPY

## 2021-09-23 NOTE — Telephone Encounter (Signed)
Reginald [name] called from [facility] about outstanding order from 03.29.2023. will refax need sign and dated ? ?

## 2021-09-23 NOTE — ED Triage Notes (Signed)
Pt presents to ED with nosebleed started at 0530. Pt is on Xarelto ?

## 2021-09-23 NOTE — ED Notes (Signed)
Pt actively bleeding from left nostril. Pressure applied by pt.  ?

## 2021-10-03 ENCOUNTER — Ambulatory Visit (INDEPENDENT_AMBULATORY_CARE_PROVIDER_SITE_OTHER): Payer: Medicare HMO | Admitting: Internal Medicine

## 2021-10-03 ENCOUNTER — Encounter: Payer: Self-pay | Admitting: Orthopedic Surgery

## 2021-10-03 ENCOUNTER — Encounter: Payer: Self-pay | Admitting: Internal Medicine

## 2021-10-03 DIAGNOSIS — E119 Type 2 diabetes mellitus without complications: Secondary | ICD-10-CM | POA: Diagnosis not present

## 2021-10-03 DIAGNOSIS — E1142 Type 2 diabetes mellitus with diabetic polyneuropathy: Secondary | ICD-10-CM | POA: Diagnosis not present

## 2021-10-03 LAB — HM DIABETES EYE EXAM

## 2021-10-03 MED ORDER — GABAPENTIN 300 MG PO CAPS: 300.0000 mg | ORAL_CAPSULE | Freq: Three times a day (TID) | ORAL | 0 refills | Status: DC

## 2021-10-03 NOTE — Telephone Encounter (Signed)
Appointment scheduled 10-03-21 with Posey Pronto ?

## 2021-10-03 NOTE — Assessment & Plan Note (Signed)
Uncontrolled with gabapentin 300 mg twice daily ?Will increase dose to 300 mg every morning and 600 mg every evening ?Referred to neurology ? ?

## 2021-10-03 NOTE — Patient Instructions (Signed)
Please take Gabapentin 300 mg in the morning and 600 mg in the evening. ?

## 2021-10-03 NOTE — Progress Notes (Signed)
?  ? ?Virtual Visit via Telephone Note  ? ?This visit type was conducted due to national recommendations for restrictions regarding the COVID-19 Pandemic (e.g. social distancing) in an effort to limit this patient's exposure and mitigate transmission in our community.  Due to his co-morbid illnesses, this patient is at least at moderate risk for complications without adequate follow up.  This format is felt to be most appropriate for this patient at this time.  The patient did not have access to video technology/had technical difficulties with video requiring transitioning to audio format only (telephone).  All issues noted in this document were discussed and addressed.  No physical exam could be performed with this format. ? ?Evaluation Performed:  Follow-up visit ? ?Date:  10/03/2021  ? ?ID:  BUD KAESER, DOB [birth year], MRN [id number] ? ?Patient Location: Home ?Provider Location: Office/Clinic ? ?Participants: Patient ?Location of Patient: Home ?Location of Provider: Telehealth ?Consent was obtain for visit to be over via telehealth. ?I verified that I am speaking with the correct person using two identifiers. ? ?PCP:  [name], MD  ? ?Chief Complaint: Tingling and pain in feet ? ?History of Present Illness:   ? ?Reginald [name] is a 51 y.o. male who has a televisit for complaint of tingling and pain in both feet despite taking gabapentin 300 mg twice daily.  He has h/o type II DM.  Denies any recent injury.  He is dose of gabapentin was recently increased to 300 mg from 100 mg.  He denies any cold sensation or pallor of the feet. ? ?The patient does not have symptoms concerning for COVID-19 infection (fever, chills, cough, or new shortness of breath).  ? ?Past Medical, Surgical, Social History, Allergies, and Medications have been Reviewed. ? ?Past Medical History:  ?Diagnosis Date  ? Anemia   ? Anxiety   ? Aortic stenosis   ? Arthritis   ? Asthma   ? Back pain   ? Cellulitis of skin with lymphangitis   ?  CHF (congestive heart failure) ([place])   ? Chronic diastolic congestive heart failure ([place])   ? Chronic venous insufficiency   ? Constipation   ? COPD (chronic obstructive pulmonary disease) ([place])   ? Coronary artery disease with history of myocardial infarction without history of CABG   ? Depression   ? Diabetes ([place])   ? Dyspnea   ? Essential hypertension   ? Gout   ? Heart murmur   ? Hypertension   ? Morbid obesity ([place])   ? Obesity   ? Pneumonia   ? walking pneumonia  ? Prostatitis   ? Pulmonary embolism ([place])   ? Pulmonary hypertension ([place])   ? Pyelonephritis   ? S/P aortic valve replacement with bioprosthetic valve 08/23/2017  ? 25 mm Edwards Inspiris Resilia stented bovine pericardial tissue valve  ? 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 24 mm Hemashield supracoronary straight graft   ? Sleep apnea   ? cpap   ? Thoracic ascending aortic aneurysm ([place])   ? Thoracic ascending aortic aneurysm ([place])   ? Tobacco abuse   ? ?Past Surgical History:  ?Procedure Laterality Date  ? AORTIC VALVE REPLACEMENT N/A 08/23/2017  ? Procedure: AORTIC VALVE REPLACEMENT (AVR) USING INSPIRIS RESILIA AORTIC VALVE SIZE 25 MM;  Surgeon: Rexene Alberts, MD;  Location: [facility];  Service: Open Heart Surgery;  Laterality: N/A;  ? CARDIAC VALVE REPLACEMENT N/A   ? [employer] 02/07/2020  ? COLONOSCOPY WITH PROPOFOL N/A 11/14/2020  ?  Procedure: COLONOSCOPY WITH PROPOFOL;  Surgeon: Gatha Mayer, MD;  Location: [facility] ENDOSCOPY;  Service: Endoscopy;  Laterality: N/A;  ? ESOPHAGOGASTRODUODENOSCOPY (EGD) WITH PROPOFOL N/A 11/14/2020  ? Procedure: ESOPHAGOGASTRODUODENOSCOPY (EGD) WITH PROPOFOL;  Surgeon: Gatha Mayer, MD;  Location: [facility] ENDOSCOPY;  Service: Endoscopy;  Laterality: N/A;  ? GIVENS CAPSULE STUDY N/A 06/30/2021  ? Procedure: GIVENS CAPSULE STUDY;  Surgeon: Harvel Quale, MD;  Location: [facility]O [facility];  Service: Gastroenterology;  Laterality: N/A;  7:30  ? MULTIPLE EXTRACTIONS WITH ALVEOLOPLASTY N/A 07/23/2017  ?  Procedure: Extraction of tooth #10 with alveoloplasty and gross debridement of remaining teeth;  Surgeon: Lenn Cal, DDS;  Location: [place];  Service: Oral Surgery;  Laterality: N/A;  ? NASAL SEPTOPLASTY W/ TURBINOPLASTY Bilateral 07/01/2021  ? Procedure: NASAL SEPTOPLASTY WITH TURBINATE REDUCTION;  Surgeon: Leta Baptist, MD;  Location: [place];  Service: ENT;  Laterality: Bilateral;  ? PACEMAKER IMPLANT N/A 08/28/2017  ?  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 TEE WITHOUT CARDIOVERSION N/A 07/16/2017  ? Procedure: TRANSESOPHAGEAL ECHOCARDIOGRAM (TEE);  Surgeon: Lelon Perla, MD;  Location: [facility] ENDOSCOPY;  Service: Cardiovascular;  Laterality: N/A;  ? TEE WITHOUT CARDIOVERSION N/A 08/23/2017  ? Procedure: TRANSESOPHAGEAL ECHOCARDIOGRAM (TEE);  Surgeon: Rexene Alberts, MD;  Location: [place];  Service: Open Heart Surgery;  Laterality: N/A;  ? THORACIC AORTIC ANEURYSM REPAIR N/A 08/23/2017  ? Procedure: THORACIC ASCENDING ANEURYSM REPAIR (AAA) USING HEMASHIELD GOLD KNITTED MICROVEL DOUBLE VELOUR VASCULAR GRAFT D: 24 MM  L: 30 CM;  Surgeon: Rexene Alberts, MD;  Location: [place];  Service: Open Heart Surgery;  Laterality: N/A;  ?  ? ?Current Meds  ?Medication Sig  ? acetaminophen (TYLENOL) 325 MG tablet Take 650 mg by mouth every 6 (six) hours as needed for mild pain.  ? albuterol (PROVENTIL) (2.5 MG/3ML) 0.083% nebulizer solution INHALE THE CONTENTS OF 1 VIAL VIA NEBULIZER EVERY 6 HOURS AS NEEDED FOR WHEEZING OR SHORTNESS OF BREATH  ? albuterol (VENTOLIN HFA) 108 (90 Base) MCG/ACT inhaler INHALE 2 PUFFS INTO THE LUNGS EVERY 6 (SIX) HOURS AS NEEDED FOR WHEEZING OR SHORTNESS OF BREATH.  ? allopurinol (ZYLOPRIM) 300 MG tablet TAKE 1 TABLET EVERY DAY  ? atorvastatin (LIPITOR) 40 MG tablet TAKE 1 TABLET EVERY DAY  ? Blood Glucose Monitoring Suppl (TRUE METRIX METER) w/Device KIT USE AS DIRECTED  ? buPROPion (WELLBUTRIN XL) 300 MG 24 hr  tablet Take 1 tablet (300 mg total) by mouth daily.  ? Cholecalciferol 125 MCG (5000 UT) TABS Take 5,000 Units by mouth daily.  ? docusate sodium (COLACE) 100 MG capsule Take 2 capsules (200 mg total) by mouth 2 (two) times daily. (Patient taking differently: Take 200 mg by mouth daily.)  ? FEROSUL 325 (65 Fe) MG tablet TAKE 1 TABLET EVERY DAY  ? fexofenadine (ALLEGRA) 180 MG tablet Take 180 mg by mouth daily.  ? gabapentin (NEURONTIN) 300 MG capsule Take 1 capsule (300 mg total) by mouth 2 (two) times daily.  ? hydrOXYzine (ATARAX) 25 MG tablet TAKE 1 TABLET BY MOUTH EVERY 8 HOURS AS NEEDED  ? Krill Oil 350 MG CAPS Take 350 mg by mouth daily.  ? metFORMIN (GLUCOPHAGE) 850 MG tablet Take 1 tablet (850 mg total) by mouth daily with breakfast.  ? metolazone (ZAROXOLYN) 5 MG tablet TAKE 1 TABLET (5 MG TOTAL) BY MOUTH THREE TIMES A WEEK. (Patient taking differently: Take 5 mg by mouth See admin instructions.  TAKE 1 TABLET (5 MG TOTAL) BY MOUTH THREE TIMES A WEEK AS NEEDED)  ? metoprolol tartrate (LOPRESSOR) 100 MG tablet TAKE 1 TABLET TWICE DAILY  ? Misc. Devices MISC Sequential compression device - 1 pair. Apply them for 1 hour in the morning and 1 hour in the evening.  ? pantoprazole (PROTONIX) 40 MG tablet TAKE 1 TABLET EVERY DAY  ? potassium chloride SA (KLOR-CON) 20 MEQ tablet TAKE 3 TABS DAILY AND TAKE AN ADDITIONAL 1 TAB ON THE DAY YOU TAKE YOUR WEEKLY METOLAZONE DOSE  ? rivaroxaban (XARELTO) 20 MG TABS tablet Take 20 mg by mouth daily.  ? Semaglutide, 2 MG/DOSE, (OZEMPIC, 2 MG/DOSE,) 8 MG/3ML SOPN Inject 2 mg into the skin once a week.  ? tamsulosin (FLOMAX) 0.4 MG CAPS capsule Take 1 capsule (0.4 mg total) by mouth 2 (two) times daily.  ? Tiotropium Bromide Monohydrate (SPIRIVA RESPIMAT) 2.5 MCG/ACT AERS Inhale 2 puffs into the lungs daily.  ? TRUE METRIX BLOOD GLUCOSE TEST test strip TEST ONE TIME DAILY FOR DIABETES  ? TRUEplus Lancets 33G MISC USE TO TEST ONE TIME DAILY FOR DIABETES  ? TURMERIC-GINGER PO Take  2 each by mouth daily.  ?  ? ?Allergies:   Patient has no known allergies.  ? ?ROS:   ?Please see the history of present illness.    ? ?All other systems reviewed and are negative. ? ? ?Labs/Other Tests and Data R

## 2021-10-05 NOTE — Progress Notes (Addendum)
[place], Reginald [name] [id number]   CLINIC:  Medical Oncology/Hematology  PCP:  [name], MD [address] [place] [address] [phone]    REASON FOR VISIT:  Follow-up for normocytic anemia   PRIOR THERAPY: Oral iron tablets   CURRENT THERAPY: IV Venofer  INTERVAL HISTORY:  Reginald [name] 51 y.o. male returns for routine follow-up of his normocytic anemia.  He was last seen by Tarri Abernethy PA-C on 09/13/2021.  At today's visit, he reports feeling somewhat poorly.  No recent hospitalizations, surgeries, or changes in baseline health status.  He continues to have occasional black bowel movements, last episode was about 2 weeks ago.  He continues to have pica, restless legs, and significant dyspnea on exertion and lightheadedness.  He is extremely fatigued.  He denies any headaches, chest pain, or syncope.  No B symptoms such as fever, chills, or unintentional weight loss.  He continues to take his iron tablet at home.  He does report that he has very dark urine.  He is extremely fatigued, and feels frustrated and discouraged that the cause of his symptoms has not yet been identified.  He has little to no energy and 100% appetite. He endorses that he is maintaining a stable weight.   REVIEW OF SYSTEMS:  Review of Systems  Constitutional:  Positive for fatigue. Negative for appetite change, chills, diaphoresis, fever and unexpected weight change.  HENT:   Negative for lump/mass and nosebleeds.   Eyes:  Negative for eye problems.  Respiratory:  Positive for shortness of breath (with exertion). Negative for cough and hemoptysis.   Cardiovascular:  Positive for palpitations. Negative for chest pain and leg swelling.  Gastrointestinal:  Negative for abdominal pain, blood in stool, constipation, diarrhea, nausea and vomiting.  Genitourinary:  Positive for frequency. Negative for hematuria.        Dark urine  Skin: Negative.    Neurological:  Positive for dizziness and light-headedness. Negative for headaches.  Hematological:  Does not bruise/bleed easily.  Psychiatric/Behavioral:  Positive for depression and sleep disturbance. The patient is nervous/anxious.      PAST MEDICAL/SURGICAL HISTORY:  Past Medical History:  Diagnosis Date   Anemia    Anxiety    Aortic stenosis    Arthritis    Asthma    Back pain    Cellulitis of skin with lymphangitis    CHF (congestive heart failure) (HCC)    Chronic diastolic congestive heart failure (HCC)    Chronic venous insufficiency    Constipation    COPD (chronic obstructive pulmonary disease) (HCC)    Coronary artery disease with history of myocardial infarction without history of CABG    Depression    Diabetes (HCC)    Dyspnea    Essential hypertension    Gout    Heart murmur    Hypertension    Morbid obesity ([facility])    Obesity    Pneumonia    walking pneumonia   Prostatitis    Pulmonary embolism ([place])    Pulmonary hypertension (HCC)    Pyelonephritis    S/P aortic valve replacement with bioprosthetic valve 08/23/2017   25 mm Edwards Inspiris Resilia stented bovine pericardial tissue valve   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   24 mm Hemashield supracoronary straight graft    Sleep apnea    cpap    Thoracic ascending aortic aneurysm [facility])    Thoracic ascending aortic aneurysm (HCC)    Tobacco abuse  Past Surgical History:  Procedure Laterality Date   AORTIC VALVE REPLACEMENT N/A 08/23/2017   Procedure: AORTIC VALVE REPLACEMENT (AVR) USING INSPIRIS RESILIA AORTIC VALVE SIZE 25 MM;  Surgeon: Rexene Alberts, MD;  Location: [place];  Service: Open Heart Surgery;  Laterality: N/A;   CARDIAC VALVE REPLACEMENT N/A    Phreesia 02/07/2020   COLONOSCOPY WITH PROPOFOL N/A 11/14/2020   Procedure: COLONOSCOPY WITH PROPOFOL;  Surgeon: Gatha Mayer, MD;  [facility] ENDOSCOPY;  Service: Endoscopy;  Laterality: N/A;   ESOPHAGOGASTRODUODENOSCOPY (EGD)  WITH PROPOFOL N/A 11/14/2020   Procedure: ESOPHAGOGASTRODUODENOSCOPY (EGD) WITH PROPOFOL;  Surgeon: Gatha Mayer, MD;  Location: [place];  Service: Endoscopy;  Laterality: N/A;   GIVENS CAPSULE STUDY N/A 06/30/2021   Procedure: GIVENS CAPSULE STUDY;  Surgeon: Harvel Quale, MD;  [facility];  Service: Gastroenterology;  Laterality: N/A;  7:30   MULTIPLE EXTRACTIONS WITH ALVEOLOPLASTY N/A 07/23/2017   Procedure: Extraction of tooth #10 with alveoloplasty and gross debridement of remaining teeth;  Surgeon: Lenn Cal, DDS;  Location: [place];  Service: Oral Surgery;  Laterality: N/A;   NASAL SEPTOPLASTY W/ TURBINOPLASTY Bilateral 07/01/2021   Procedure: NASAL SEPTOPLASTY WITH TURBINATE REDUCTION;  Surgeon: Leta Baptist, MD;  Location: [facility];  Service: ENT;  Laterality: Bilateral;   PACEMAKER IMPLANT N/A 08/28/2017    St Jude Medical Assurity MRI conditional  dual-chamber pacemaker for symptomatic complete heart blockby Dr Rayann Heman   TEE WITHOUT CARDIOVERSION N/A 07/16/2017   Procedure: TRANSESOPHAGEAL ECHOCARDIOGRAM (TEE);  Surgeon: Lelon Perla, MD;  Location: [facility] ENDOSCOPY;  Service: Cardiovascular;  Laterality: N/A;   TEE WITHOUT CARDIOVERSION N/A 08/23/2017   Procedure: TRANSESOPHAGEAL ECHOCARDIOGRAM (TEE);  Surgeon: Rexene Alberts, MD;  Location: [place];  Service: Open Heart Surgery;  Laterality: N/A;   THORACIC AORTIC ANEURYSM REPAIR N/A 08/23/2017   Procedure: THORACIC ASCENDING ANEURYSM REPAIR (AAA) USING HEMASHIELD GOLD KNITTED MICROVEL DOUBLE VELOUR VASCULAR GRAFT D: 24 MM  L: 30 CM;  Surgeon: Rexene Alberts, MD;  Location: [place];  Service: Open Heart Surgery;  Laterality: N/A;     SOCIAL HISTORY:  Social History   Socioeconomic History   Marital status: Divorced    Spouse name: Not on file   Number of children: Not on file   Years of education: Not on file   Highest education level: Not on file  Occupational History   Not on file  Tobacco Use    Smoking status: Former    Packs/day: 2.00    Years: 16.00    Pack years: 32.00    Types: Cigarettes    Quit date: 08/23/2017    Years since quitting: 4.1   Smokeless tobacco: Never  Vaping Use   Vaping Use: Never used  Substance and Sexual Activity   Alcohol use: No    Comment: have had alcohol in the past, not heavy   Drug use: No   Sexual activity: Not Currently    Partners: Female  Other Topics Concern   Not on file  Social History Narrative   Not on file   Social Determinants of Health   Financial Resource Strain: Low Risk    Difficulty of Paying Living Expenses: Not hard at all  Food Insecurity: No Food Insecurity   Worried About Charity fundraiser in the Last Year: Never true   [place] in the Last Year: Never true  Transportation Needs: No Transportation Needs   Lack of Transportation (Medical): No  Lack of Transportation (Non-Medical): No  Physical Activity: Inactive   Days of Exercise per Week: 0 days   Minutes of Exercise per Session: 0 min  Stress: No Stress Concern Present   Feeling of Stress : Only a little  Social Connections: Socially Isolated   Frequency of Communication with Friends and Family: Once a week   Frequency of Social Gatherings with Friends and Family: Never   Attends Religious Services: Never   Printmaker: No   Attends Music therapist: More than 4 times per year   Marital Status: Never married  Human resources officer Violence: Not At Risk   Fear of Current or Ex-Partner: No   Emotionally Abused: No   Physically Abused: No   Sexually Abused: No    FAMILY HISTORY:  Family History  Problem Relation Age of Onset   Hypertension Mother    Alzheimer's disease Mother    Heart attack Mother    Heart attack Brother    Diabetes Brother     CURRENT MEDICATIONS:  Outpatient Encounter Medications as of 10/06/2021  Medication Sig Note   acetaminophen (TYLENOL) 325 MG tablet Take 650 mg by mouth  every 6 (six) hours as needed for mild pain.    albuterol (PROVENTIL) (2.5 MG/3ML) 0.083% nebulizer solution INHALE THE CONTENTS OF 1 VIAL VIA NEBULIZER EVERY 6 HOURS AS NEEDED FOR WHEEZING OR SHORTNESS OF BREATH    albuterol (VENTOLIN HFA) 108 (90 Base) MCG/ACT inhaler INHALE 2 PUFFS INTO THE LUNGS EVERY 6 (SIX) HOURS AS NEEDED FOR WHEEZING OR SHORTNESS OF BREATH.    allopurinol (ZYLOPRIM) 300 MG tablet TAKE 1 TABLET EVERY DAY    atorvastatin (LIPITOR) 40 MG tablet TAKE 1 TABLET EVERY DAY    Blood Glucose Monitoring Suppl (TRUE METRIX METER) w/Device KIT USE AS DIRECTED    buPROPion (WELLBUTRIN XL) 300 MG 24 hr tablet Take 1 tablet (300 mg total) by mouth daily.    Cholecalciferol 125 MCG (5000 UT) TABS Take 5,000 Units by mouth daily.    docusate sodium (COLACE) 100 MG capsule Take 2 capsules (200 mg total) by mouth 2 (two) times daily. (Patient taking differently: Take 200 mg by mouth daily.)    FEROSUL 325 (65 Fe) MG tablet TAKE 1 TABLET EVERY DAY    fexofenadine (ALLEGRA) 180 MG tablet Take 180 mg by mouth daily.    gabapentin (NEURONTIN) 300 MG capsule Take 1 capsule (300 mg total) by mouth 3 (three) times daily.    hydrOXYzine (ATARAX) 25 MG tablet TAKE 1 TABLET BY MOUTH EVERY 8 HOURS AS NEEDED    Krill Oil 350 MG CAPS Take 350 mg by mouth daily.    metFORMIN (GLUCOPHAGE) 850 MG tablet Take 1 tablet (850 mg total) by mouth daily with breakfast.    metolazone (ZAROXOLYN) 5 MG tablet TAKE 1 TABLET (5 MG TOTAL) BY MOUTH THREE TIMES A WEEK. (Patient taking differently: Take 5 mg by mouth See admin instructions. TAKE 1 TABLET (5 MG TOTAL) BY MOUTH THREE TIMES A WEEK AS NEEDED) 04/19/2021: Using as needed of cardiology    metoprolol tartrate (LOPRESSOR) 100 MG tablet TAKE 1 TABLET TWICE DAILY    Misc. Devices MISC Sequential compression device - 1 pair. Apply them for 1 hour in the morning and 1 hour in the evening.    pantoprazole (PROTONIX) 40 MG tablet TAKE 1 TABLET EVERY DAY    potassium  chloride SA (KLOR-CON) 20 MEQ tablet TAKE 3 TABS DAILY AND TAKE AN ADDITIONAL  1 TAB ON THE DAY YOU TAKE YOUR WEEKLY METOLAZONE DOSE    rivaroxaban (XARELTO) 20 MG TABS tablet Take 20 mg by mouth daily.    Semaglutide, 2 MG/DOSE, (OZEMPIC, 2 MG/DOSE,) 8 MG/3ML SOPN Inject 2 mg into the skin once a week. 08/17/2021: Patient receives from [facility] patient assistance program. To start late April 2023.   tamsulosin (FLOMAX) 0.4 MG CAPS capsule Take 1 capsule (0.4 mg total) by mouth 2 (two) times daily.    Tiotropium Bromide Monohydrate (SPIRIVA RESPIMAT) 2.5 MCG/ACT AERS Inhale 2 puffs into the lungs daily. 03/03/2021: Gets from patient assistance ([employer])   torsemide (DEMADEX) 20 MG tablet Take 2 tablets (40 mg total) by mouth daily for 30 doses. (Patient taking differently: Take 40 mg by mouth 2 (two) times daily.)    TRUE METRIX BLOOD GLUCOSE TEST test strip TEST ONE TIME DAILY FOR DIABETES    TRUEplus Lancets 33G MISC USE TO TEST ONE TIME DAILY FOR DIABETES    TURMERIC-GINGER PO Take 2 each by mouth daily.    No facility-administered encounter medications on file as of 10/06/2021.    ALLERGIES:  No Known Allergies   PHYSICAL EXAM:  ECOG PERFORMANCE STATUS: 3 - Symptomatic, >50% confined to bed  There were no vitals filed for this visit. There were no vitals filed for this visit. Physical Exam Constitutional:      Appearance: Normal appearance. He is morbidly obese.  HENT:     Head: Normocephalic and atraumatic.     Mouth/Throat:     Mouth: Mucous membranes are moist.  Eyes:     Extraocular Movements: Extraocular movements intact.     Pupils: Pupils are equal, round, and reactive to light.  Cardiovascular:     Rate and Rhythm: Normal rate and regular rhythm.     Pulses: Normal pulses.     Heart sounds: Murmur (murmur and click at right upper sternal border) heard.  Pulmonary:     Effort: Pulmonary effort is normal.     Breath sounds: Normal breath sounds.     Comments: Muffled  breath sounds secondary to body habitus Abdominal:     General: Bowel sounds are normal.     Palpations: Abdomen is soft.     Tenderness: There is no abdominal tenderness.  Musculoskeletal:        General: No swelling.     Right lower leg: Edema present.     Left lower leg: Edema present.  Lymphadenopathy:     Cervical: No cervical adenopathy.  Skin:    General: Skin is warm and dry.  Neurological:     General: No focal deficit present.     Mental Status: He is alert and oriented to person, place, and time.  Psychiatric:        Mood and Affect: Mood normal.        Behavior: Behavior normal.     LABORATORY DATA:  I have reviewed the labs as listed.  CBC    Component Value Date/Time   WBC 9.4 09/14/2021 1240   RBC 4.58 09/14/2021 1240   RBC 4.55 09/14/2021 1240   HGB 11.9 (L) 09/14/2021 1240   HGB 10.2 (L) 07/05/2021 1308   HCT 37.7 (L) 09/14/2021 1240   HCT 32.2 (L) 07/05/2021 1308   PLT 276 09/14/2021 1240   PLT 262 07/05/2021 1308   MCV 82.3 09/14/2021 1240   MCV 79 07/05/2021 1308   MCH 26.0 09/14/2021 1240   MCHC 31.6 09/14/2021 1240   RDW  17.6 (H) 09/14/2021 1240   RDW 17.7 (H) 07/05/2021 1308   LYMPHSABS 2.1 09/14/2021 1240   LYMPHSABS 2.2 07/05/2021 1308   MONOABS 0.5 09/14/2021 1240   EOSABS 0.3 09/14/2021 1240   EOSABS 0.3 07/05/2021 1308   BASOSABS 0.1 09/14/2021 1240   BASOSABS 0.0 07/05/2021 1308      Latest Ref Rng & Units 09/21/2021    1:57 PM 09/14/2021   12:40 PM 08/05/2021   11:46 AM  CMP  Glucose 70 - 99 mg/dL 95   152   122    BUN 6 - 20 mg/dL '23   30   19    ' Creatinine 0.61 - 1.24 mg/dL 1.26   1.39   1.29    Sodium 135 - 145 mmol/L 137   136   138    Potassium 3.5 - 5.1 mmol/L 3.5   2.4   3.7    Chloride 98 - 111 mmol/L 96   91   101    CO2 22 - 32 mmol/L 30   33   30    Calcium 8.9 - 10.3 mg/dL 9.3   9.3   8.5    Total Protein 6.5 - 8.1 g/dL  9.0     Total Bilirubin 0.3 - 1.2 mg/dL  2.0     Alkaline Phos 38 - 126 U/L  74     AST 15 -  41 U/L  44     ALT 0 - 44 U/L  18       DIAGNOSTIC IMAGING:  I have independently reviewed the relevant imaging and discussed with the patient.  ASSESSMENT & PLAN: 1.   Normocytic anemia: - Recent hospitalization with acute renal failure, 1 unit PRBC on 07/21/2021 for hemoglobin of 8.3. - EGD and colonoscopy in June 2022 with hemorrhoids but no other potential etiology for bleeding. - He was seen at [facility] in April 2023, with plans for upcoming push enteroscopy  - He was on Xarelto since 2019.  He had a history of PE and also has paroxysmal atrial fibrillation.  Xarelto was recently discontinued during his hospitalization on 07/21/2021.  He started back on Xarelto on 07/28/2021. - He had a couple of nosebleeds since Xarelto started back.  Denies any BRBPR.  He has black stools.  -He reports dark urine - He has been taking iron tablet daily for the past 1 year. - Received IV iron with Venofer 1000 mg from 08/05/2021 through 08/25/2021 - Symptoms after IV iron minimally improved but with persistent fatigue, DOE, lightheadedness, and pica  - Additional laboratory work-up (07/29/2021): SPEP negative.  Elevated light chains/normal ratio from CKD.  Normal B12 and folate.   - Labs (09/06/2021): Hgb improved 11.6/MCV 83.4, ferritin 215, iron saturation 14% with TIBC 469 -- Additional labs consistent with hemolysis (see below), likely related to his bovine AVR. - Hemoglobin improved after IV iron.  Labs consistent with normocytic anemia from CKD, functional iron deficiency, and mild hemolysis. - PLAN: Recommend additional IV iron with Venofer 400 mg x 1 dose  - Continue oral iron  - Repeat CBC and iron panel with RTC in 2 months for MD visit  2.  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  (No concern for Wilson's disease) - He may have mild hemolysis secondary to  bovine AVR, but likely not to the degree to explain his extremely elevated LDH - He reports dark urine, which could also signify underlying PNH - PLAN: We will check PNH panel and urinalysis.  Continue monitoring of CBCs as above.  Continue follow-up with cardiology for monitoring of AVR. - We will repeat LDH and hemolysis labs in 2 months - MD visit in 2 months  3.  Other history - Chronic anticoagulation with Xarelto due to atrial fibrillation, history of bilateral PE, and history of bovine AVR - Other PMH includes repair of ascending thoracic aortic aneurysm (2019), atrial fibrillation, diastolic congestive heart failure, complete heart block with pacemaker, history of bilateral PE, morbid obesity, and sleep apnea   4.  Social/family history: - He lives by himself.  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 - Quit smoking on 08/23/2017.  Smoked 2 packs/day for 30 years. - No family history of malignancies.   All questions were answered. The patient knows to call the clinic with any problems, questions or concerns.  Medical decision making: Moderate  Time spent on visit: I spent 20 minutes counseling the patient face to face. The total time spent in the appointment was 30 minutes and more than 50% was on counseling.   Harriett Rush, PA-C  10/06/2021 11:33 PM  ADDENDUM:  Peripheral blood smear from 10/10/2021 personally reviewed by myself and Dr. Delton Coombes - Normal RBC morphology with occasional tear drop cells. Harriett Rush 10/12/21 1:12 PM

## 2021-10-06 ENCOUNTER — Encounter (HOSPITAL_COMMUNITY): Payer: Self-pay | Admitting: Physician Assistant

## 2021-10-06 ENCOUNTER — Inpatient Hospital Stay (HOSPITAL_COMMUNITY): Payer: Medicare HMO | Attending: Hematology | Admitting: Physician Assistant

## 2021-10-06 VITALS — BP 109/69 | HR 73 | Temp 99.3°F | Resp 18 | Ht 71.0 in | Wt >= 6400 oz

## 2021-10-06 DIAGNOSIS — D509 Iron deficiency anemia, unspecified: Secondary | ICD-10-CM | POA: Diagnosis not present

## 2021-10-06 DIAGNOSIS — Z86711 Personal history of pulmonary embolism: Secondary | ICD-10-CM | POA: Diagnosis not present

## 2021-10-06 DIAGNOSIS — D594 Other nonautoimmune hemolytic anemias: Secondary | ICD-10-CM | POA: Diagnosis not present

## 2021-10-06 DIAGNOSIS — Z7901 Long term (current) use of anticoagulants: Secondary | ICD-10-CM | POA: Diagnosis not present

## 2021-10-06 DIAGNOSIS — I4891 Unspecified atrial fibrillation: Secondary | ICD-10-CM | POA: Diagnosis not present

## 2021-10-06 DIAGNOSIS — Z87891 Personal history of nicotine dependence: Secondary | ICD-10-CM | POA: Insufficient documentation

## 2021-10-06 DIAGNOSIS — Z79899 Other long term (current) drug therapy: Secondary | ICD-10-CM | POA: Insufficient documentation

## 2021-10-06 NOTE — Addendum Note (Signed)
Addended by: Tarri Abernethy on: 10/06/2021 11:52 PM   Modules accepted: Orders

## 2021-10-06 NOTE — Patient Instructions (Signed)
[place] at [facility] Discharge Instructions  You were seen today by Tarri Abernethy PA-C for your anemia.  Your labs show that you have some mild hemolytic anemia, but we do not yet know the cause of this.  It may be related to your heart valve replacement, but we will check a few other tests at your follow-up labs in 2 months.    We will schedule you for IV iron x 1 dose.  Labs in 2  months.  Office visit 2 weeks after labs.      Thank you for choosing [place] at [facility] to provide your oncology and hematology care.  To afford each patient quality time with our provider, please arrive at least 15 minutes before your scheduled appointment time.   If you have a lab appointment with the [facility] please come in thru the Main Entrance and check in at the main information desk.  You need to re-schedule your appointment should you arrive 10 or more minutes late.  We strive to give you quality time with our providers, and arriving late affects you and other patients whose appointments are after yours.  Also, if you no show three or more times for appointments you may be dismissed from the clinic at the providers discretion.     Again, thank you for choosing [facility].  Our hope is that these requests will decrease the amount of time that you wait before being seen by our physicians.       _____________________________________________________________  Should you have questions after your visit to [facility], please contact our office at [phone] and follow the prompts.  Our office hours are 8:00 a.m. and 4:30 p.m. Monday - Friday.  Please note that voicemails left after 4:00 p.m. may not be returned until the following business day.  We are closed weekends and major holidays.  You do have access to a nurse 24-7, just call the main number to the clinic [phone] and do not press any options, hold on  the line and a nurse will answer the phone.    For prescription refill requests, have your pharmacy contact our office and allow 72 hours.    Due to Covid, you will need to wear a mask upon entering the hospital. If you do not have a mask, a mask will be given to you at the Main Entrance upon arrival. For doctor visits, patients may have 1 support person age 31 or older with them. For treatment visits, patients can not have anyone with them due to social distancing guidelines and our immunocompromised population.

## 2021-10-07 ENCOUNTER — Ambulatory Visit: Payer: Medicare HMO | Admitting: Orthopedic Surgery

## 2021-10-07 ENCOUNTER — Encounter: Payer: Self-pay | Admitting: Orthopedic Surgery

## 2021-10-07 VITALS — Ht 71.0 in | Wt >= 6400 oz

## 2021-10-07 DIAGNOSIS — M25561 Pain in right knee: Secondary | ICD-10-CM

## 2021-10-07 DIAGNOSIS — G8929 Other chronic pain: Secondary | ICD-10-CM | POA: Diagnosis not present

## 2021-10-07 DIAGNOSIS — Z6841 Body Mass Index (BMI) 40.0 and over, adult: Secondary | ICD-10-CM | POA: Diagnosis not present

## 2021-10-07 DIAGNOSIS — M25562 Pain in left knee: Secondary | ICD-10-CM | POA: Diagnosis not present

## 2021-10-07 NOTE — Patient Instructions (Signed)

## 2021-10-07 NOTE — Progress Notes (Signed)
Orthopaedic Clinic Return  Assessment: Reginald [name] is a 51 y.o. male with the following: Bilateral knee pain; L > R  Plan: Bilateral knee pain.  Left knee MRI demonstrates mensicus tear.  He is not a good surgical candidate.  If interested in considering surgery he will have to return to see Dr. Marlou Sa.  He is interested in injections, which were completed in clinic today.   Procedure note injection Left knee joint   Verbal consent was obtained to inject the left knee joint  Timeout was completed to confirm the site of injection.  The skin was prepped with alcohol and ethyl chloride was sprayed at the injection site.  A 21-gauge needle was used to inject 40 mg of Depo-Medrol and 1% lidocaine (3 cc) into the left knee using an anterolateral approach.  There were no complications. A sterile bandage was applied.     Procedure note injection Right knee joint   Verbal consent was obtained to inject the right knee joint  Timeout was completed to confirm the site of injection.  The skin was prepped with alcohol and ethyl chloride was sprayed at the injection site.  A 21-gauge needle was used to inject 40 mg of Depo-Medrol and 1% lidocaine (3 cc) into the left knee using an anterolateral approach.  There were no complications. A sterile bandage was applied.   The patient meets the AMA guidelines for Morbid obesity with BMI > 40.  The patient has been counseled on weight loss.     Body mass index is 68.48 kg/m.  Follow-up: Return if symptoms worsen or fail to improve.   Subjective:  Chief Complaint  Patient presents with   Knee Pain    Bilat knee pain for years but getting worse. Having more catching and instability with the left knee. Knows he needs a knee replacement but was told he needs to lose weight before.     History of Present Illness: Reginald [name] is a 51 y.o. male who returns to clinic for evaluation of bilateral knee pain.  Pain is worse in the left, compared to the  right.  No specific injury.  He is aware that he needs to lose weight.  He has seen Dr. Marlou Sa in [place], who recommended against surgery.  He has multiple medical comorbidities, which would prevent him from having surgery in [place].  MRI from a couple years ago demonstrates a meniscus tear in his left knee.  He has noted more frequent catching and locking.  He is walking with a cane.  Review of Systems: No fevers or chills No numbness or tingling No chest pain No shortness of breath No bowel or bladder dysfunction No GI distress No headaches   Objective: Ht '5\' 11"'$  (1.803 m)   Wt (!) 491 lb (222.7 kg)   BMI 68.48 kg/m   Physical Exam:  Obese male.  Ambulates with a cane.  Difficult to discern whether or not his knees are swollen.  He has tenderness to palpation over the medial lateral joint lines of the left knee.  Tenderness palpation over the medial joint line in the right knee.  He has full range of motion.  Toes are warm and well-perfused.  IMAGING: I personally ordered and reviewed the following images:  No new imaging obtained today.   Mordecai Rasmussen, MD 10/07/2021 9:32 PM

## 2021-10-10 ENCOUNTER — Inpatient Hospital Stay (HOSPITAL_COMMUNITY): Payer: Medicare HMO

## 2021-10-10 ENCOUNTER — Encounter (HOSPITAL_COMMUNITY): Payer: Self-pay

## 2021-10-10 VITALS — BP 119/84 | HR 70 | Temp 98.9°F | Resp 20

## 2021-10-10 DIAGNOSIS — D509 Iron deficiency anemia, unspecified: Secondary | ICD-10-CM | POA: Diagnosis not present

## 2021-10-10 DIAGNOSIS — I4891 Unspecified atrial fibrillation: Secondary | ICD-10-CM | POA: Diagnosis not present

## 2021-10-10 DIAGNOSIS — Z87891 Personal history of nicotine dependence: Secondary | ICD-10-CM | POA: Diagnosis not present

## 2021-10-10 DIAGNOSIS — D594 Other nonautoimmune hemolytic anemias: Secondary | ICD-10-CM

## 2021-10-10 DIAGNOSIS — Z7901 Long term (current) use of anticoagulants: Secondary | ICD-10-CM | POA: Diagnosis not present

## 2021-10-10 DIAGNOSIS — Z79899 Other long term (current) drug therapy: Secondary | ICD-10-CM | POA: Diagnosis not present

## 2021-10-10 DIAGNOSIS — Z86711 Personal history of pulmonary embolism: Secondary | ICD-10-CM | POA: Diagnosis not present

## 2021-10-10 LAB — CBC WITH DIFFERENTIAL/PLATELET
Abs Immature Granulocytes: 0.1 10*3/uL — ABNORMAL HIGH (ref 0.00–0.07)
Basophils Absolute: 0.1 10*3/uL (ref 0.0–0.1)
Basophils Relative: 1 %
Eosinophils Absolute: 0.4 10*3/uL (ref 0.0–0.5)
Eosinophils Relative: 4 %
HCT: 37.8 % — ABNORMAL LOW (ref 39.0–52.0)
Hemoglobin: 11.1 g/dL — ABNORMAL LOW (ref 13.0–17.0)
Immature Granulocytes: 1 %
Lymphocytes Relative: 19 %
Lymphs Abs: 1.8 10*3/uL (ref 0.7–4.0)
MCH: 25 pg — ABNORMAL LOW (ref 26.0–34.0)
MCHC: 29.4 g/dL — ABNORMAL LOW (ref 30.0–36.0)
MCV: 85.1 fL (ref 80.0–100.0)
Monocytes Absolute: 0.5 10*3/uL (ref 0.1–1.0)
Monocytes Relative: 5 %
Neutro Abs: 6.8 10*3/uL (ref 1.7–7.7)
Neutrophils Relative %: 71 %
Platelets: 226 10*3/uL (ref 150–400)
RBC: 4.44 MIL/uL (ref 4.22–5.81)
RDW: 16.9 % — ABNORMAL HIGH (ref 11.5–15.5)
WBC: 9.6 10*3/uL (ref 4.0–10.5)
nRBC: 0 % (ref 0.0–0.2)
nRBC: 0 /100 WBC

## 2021-10-10 LAB — URINALYSIS, ROUTINE W REFLEX MICROSCOPIC
Bacteria, UA: NONE SEEN
Bilirubin Urine: NEGATIVE
Glucose, UA: NEGATIVE mg/dL
Ketones, ur: NEGATIVE mg/dL
Leukocytes,Ua: NEGATIVE
Nitrite: NEGATIVE
Protein, ur: NEGATIVE mg/dL
Specific Gravity, Urine: 1.004 — ABNORMAL LOW (ref 1.005–1.030)
pH: 6 (ref 5.0–8.0)

## 2021-10-10 LAB — SAVE SMEAR(SSMR), FOR PROVIDER SLIDE REVIEW

## 2021-10-10 MED ORDER — SODIUM CHLORIDE 0.9 % IV SOLN
400.0000 mg | Freq: Once | INTRAVENOUS | Status: AC
  Administered 2021-10-10: 400 mg via INTRAVENOUS
  Filled 2021-10-10: qty 20

## 2021-10-10 MED ORDER — SODIUM CHLORIDE 0.9 % IV SOLN: Freq: Once | INTRAVENOUS | Status: AC

## 2021-10-10 NOTE — Patient Instructions (Signed)
[facility]  Discharge Instructions: Thank you for choosing [facility] to provide your oncology and hematology care.  If you have a lab appointment with the Cancer Center, please come in thru the Main Entrance and check in at the main information desk.  Wear comfortable clothing and clothing appropriate for easy access to any Portacath or PICC line.   We strive to give you quality time with your provider. You may need to reschedule your appointment if you arrive late (15 or more minutes).  Arriving late affects you and other patients whose appointments are after yours.  Also, if you miss three or more appointments without notifying the office, you may be dismissed from the clinic at the provider's discretion.      For prescription refill requests, have your pharmacy contact our office and allow 72 hours for refills to be completed.    Today you received the following: Venofer, return as scheduled.   To help prevent nausea and vomiting after your treatment, we encourage you to take your nausea medication as directed.  BELOW ARE SYMPTOMS THAT SHOULD BE REPORTED IMMEDIATELY: *FEVER GREATER THAN 100.4 F (38 C) OR HIGHER *CHILLS OR SWEATING *NAUSEA AND VOMITING THAT IS NOT CONTROLLED WITH YOUR NAUSEA MEDICATION *UNUSUAL SHORTNESS OF BREATH *UNUSUAL BRUISING OR BLEEDING *URINARY PROBLEMS (pain or burning when urinating, or frequent urination) *BOWEL PROBLEMS (unusual diarrhea, constipation, pain near the anus) TENDERNESS IN MOUTH AND THROAT WITH OR WITHOUT PRESENCE OF ULCERS (sore throat, sores in mouth, or a toothache) UNUSUAL RASH, SWELLING OR PAIN  UNUSUAL VAGINAL DISCHARGE OR ITCHING   Items with * indicate a potential emergency and should be followed up as soon as possible or go to the Emergency Department if any problems should occur.  Please show the CHEMOTHERAPY ALERT CARD or IMMUNOTHERAPY ALERT CARD at check-in to the Emergency Department and triage  nurse.  Should you have questions after your visit or need to cancel or reschedule your appointment, please contact [facility] [phone]  and follow the prompts.  Office hours are 8:00 a.m. to 4:30 p.m. Monday - Friday. Please note that voicemails left after 4:00 p.m. may not be returned until the following business day.  We are closed weekends and major holidays. You have access to a nurse at all times for urgent questions. Please call the main number to the clinic [phone] and follow the prompts.  For any non-urgent questions, you may also contact your provider using MyChart. We now offer e-Visits for anyone 18 and older to request care online for non-urgent symptoms. For details visit [email or web address].   Also download the MyChart app! Go to the app store, search "MyChart", open the app, select [facility], and log in with your MyChart username and password.  Due to Covid, a mask is required upon entering the hospital/clinic. If you do not have a mask, one will be given to you upon arrival. For doctor visits, patients may have 1 support person aged 18 or older with them. For treatment visits, patients cannot have anyone with them due to current Covid guidelines and our immunocompromised population.  

## 2021-10-10 NOTE — [facility] Notes (Signed)
Patient presents today for iron infusion, patient took pre-medications this morning, pharmacy aware. Patient tolerated iron infusion with no complaints voiced. Peripheral IV site clean and dry with good blood return noted before and after infusion. Band aid applied. VSS with discharge and left in satisfactory condition with no s/s of distress noted.

## 2021-10-11 ENCOUNTER — Ambulatory Visit (INDEPENDENT_AMBULATORY_CARE_PROVIDER_SITE_OTHER): Payer: Medicare HMO | Admitting: Family Medicine

## 2021-10-11 ENCOUNTER — Encounter: Payer: Self-pay | Admitting: Family Medicine

## 2021-10-11 ENCOUNTER — Ambulatory Visit (HOSPITAL_COMMUNITY): Payer: Medicare HMO | Admitting: Physician Assistant

## 2021-10-11 VITALS — BP 131/86 | HR 83 | Ht 71.0 in | Wt >= 6400 oz

## 2021-10-11 DIAGNOSIS — F322 Major depressive disorder, single episode, severe without psychotic features: Secondary | ICD-10-CM

## 2021-10-11 DIAGNOSIS — I10 Essential (primary) hypertension: Secondary | ICD-10-CM

## 2021-10-11 DIAGNOSIS — E1169 Type 2 diabetes mellitus with other specified complication: Secondary | ICD-10-CM

## 2021-10-11 DIAGNOSIS — L97921 Non-pressure chronic ulcer of unspecified part of left lower leg limited to breakdown of skin: Secondary | ICD-10-CM

## 2021-10-11 DIAGNOSIS — L97929 Non-pressure chronic ulcer of unspecified part of left lower leg with unspecified severity: Secondary | ICD-10-CM | POA: Insufficient documentation

## 2021-10-11 MED ORDER — DOXYCYCLINE HYCLATE 100 MG PO TABS: 100.0000 mg | ORAL_TABLET | Freq: Two times a day (BID) | ORAL | 0 refills | Status: DC

## 2021-10-11 NOTE — Patient Instructions (Signed)
F/u with PCP, dr Posey Pronto in 4  to 5 weeks, re evaluate leg ulcer   10 day course of antibiotic is prescribed and you are being referred to wound care, twice weekly x 4 weeks [place] requested by pt as he lives in [place], nurse please refer, I will sign  Work on weight loss by eating mainly vegetables, and only drinking water  Yes , You were referred to specialist ( neurologist) by dr Posey Pronto re nerve pain  Think about what you will eat, plan ahead. Choose " clean, green, fresh or frozen" over canned, processed or packaged foods which are more sugary, salty and fatty. 70 to 75% of food eaten should be vegetables and fruit. Three meals at set times with snacks allowed between meals, but they must be fruit or vegetables. Aim to eat over a 12 hour period , example 7 am to 7 pm, and STOP after  your last meal of the day. Drink water,generally about 64 ounces per day, no other drink is as healthy. Fruit juice is best enjoyed in a healthy way, by EATING the fruit. It is important that you exercise regularly at least 30 minutes 5 times a week. If you develop chest pain, have severe difficulty breathing, or feel very tired, stop exercising immediately and seek medical attention    Thanks for choosing [facility], we consider it a privelige to serve you.

## 2021-10-11 NOTE — [facility] Notes (Signed)
    Reginald [name]     MRN: [id number]      DOB: 05-23-1970   HPI Reginald [name] is here c/o blister to both lkegs x 1 week left lower leg blister popped, no fever or chills no pus, similar episdoe approx 1 year ago Requesting in home wound care sgates Reginald [name] is far, will look for wound care in [place]. Diabetes is well controlled ROS Denies recent fever or chills. Denies sinus pressure, nasal congestion, ear pain or sore throat. Denies chest congestion, productive cough or wheezing. Denies chest pains, palpitations and leg swelling Denies abdominal pain, nausea, vomiting,diarrhea or constipation.   Denies dysuria, frequency, hesitancy or incontinence. Chronic  limitation in mobility. Denies headaches, seizures, numbness, or tingling. Denies depression, anxiety or insomnia.    PE  BP 131/86   Pulse 83   Ht '5\' 11"'$  (1.803 m)   Wt (!) 483 lb 0.6 oz (219.1 kg)   SpO2 95%   BMI 67.37 kg/m   Patient alert and oriented and in no cardiopulmonary distress.  HEENT: No facial asymmetry, EOMI,     Neck supple .  Chest: Clear to auscultation bilaterally.  CVS: S1, S2 no murmurs, no S3.Regular rate.  ABD: Soft non tender.   Ext: bilateral pitting edema, one plus  edema  JK:[id number]  ROM spine, shoulders, hips and knees.  Skin: bilateral leg ulcers largest on left anterior leg max dia approx 6 cm, erythema and swelling of surrounding skin  sych: Good eye contact, normal affect. Memory intact not anxious or depressed appearing.  CNS: CN 2-12 intact, power,  normal throughout.no focal deficits noted.   Assessment & Plan  Leg ulcer, left (HCC) Doxycycline x10 days.  Referred to wound clinic as high risk of complications from diabetes morbid obesity and limited mobility.  Follow-up with PCP.  Essential hypertension Controlled, no change in medication   Depression, major, single episode, severe (HCC) F/u with pCP  Morbid obesity ([place])  Patient re-educated about  the importance  of commitment to a  minimum of 150 minutes of exercise per week as able.  The importance of healthy food choices with portion control discussed, as well as eating regularly and within a 12 hour window most days. The need to choose "clean , green" food 50 to 75% of the time is discussed, as well as to make water the primary drink and set a goal of 64 ounces water daily.       10/11/2021    1:04 PM 10/07/2021    8:18 AM 10/06/2021    9:17 AM  Weight /BMI  Weight 483 lb 0.6 oz 491 lb 491 lb 6.5 oz  Height '5\' 11"'$  (1.803 m) '5\' 11"'$  (1.803 m) '5\' 11"'$  (1.803 m)  BMI 67.37 kg/m2 68.48 kg/m2 68.54 kg/m2      DM2 (diabetes mellitus, type 2) (HCC) Controlled, no change in medication

## 2021-10-11 NOTE — [facility] Notes (Signed)
   Reginald [name]     MRN: [id number]      DOB: November 05, 1970   HPI Reginald [name] is here  c/o redness, swelling and blisters on both legs, left worse, and burst blister 1 week ago. No fever,or chills. Had similar situation approx 1 year ago and needed wound care Diabetes is controlled   ROS Denies recent fever or chills. Denies sinus pressure, nasal congestion, ear pain or sore throat. Denies chest congestion, productive cough or wheezing. Denies chest pains, palpitations and leg swelling Denies abdominal pain, nausea, vomiting,diarrhea or constipation.   Denies dysuria, frequency, hesitancy or incontinence. Chronic  limitation in mobility. Denies headaches, seizures,  PE  BP 131/86   Pulse 83   Ht '5\' 11"'$  (1.803 m)   Wt (!) 483 lb 0.6 oz (219.1 kg)   SpO2 95%   BMI 67.37 kg/m   Patient alert and oriented and in no cardiopulmonary distress.  HEENT: No facial asymmetry, EOMI,     Neck supple .  Chest: Clear to auscultation bilaterally.  CVS: S1, S2 no murmurs, no S3.Regular rate.  ABD: Soft non tender.   Ext: No edema  MS: decreased  ROM spine, shoulders, hips and knees.  Skin: Ieft leg ulcer anterior , max diameter approx 6 cm Psych: Good eye contact, normal affect. Memory intact not anxious or depressed appearing.  CNS: CN 2-12 intact, power,  normal throughout.no focal deficits noted.   Assessment & Plan  Leg ulcer, left (HCC) Doxycycline x10 days.  Referred to wound clinic as high risk of complications from diabetes morbid obesity and limited mobility.  Follow-up with PCP.  Essential hypertension Controlled, no change in medication   Depression, major, single episode, severe (HCC) F/u with pCP  Morbid obesity ([place])  Patient re-educated about  the importance of commitment to a  minimum of 150 minutes of exercise per week as able.  The importance of healthy food choices with portion control discussed, as well as eating regularly and within a 12 hour window most  days. The need to choose "clean , green" food 50 to 75% of the time is discussed, as well as to make water the primary drink and set a goal of 64 ounces water daily.       10/11/2021    1:04 PM 10/07/2021    8:18 AM 10/06/2021    9:17 AM  Weight /BMI  Weight 483 lb 0.6 oz 491 lb 491 lb 6.5 oz  Height '5\' 11"'$  (1.803 m) '5\' 11"'$  (1.803 m) '5\' 11"'$  (1.803 m)  BMI 67.37 kg/m2 68.48 kg/m2 68.54 kg/m2      DM2 (diabetes mellitus, type 2) (HCC) Controlled, no change in medication

## 2021-10-11 NOTE — Assessment & Plan Note (Signed)
Doxycycline x10 days.  Referred to wound clinic as high risk of complications from diabetes morbid obesity and limited mobility.  Follow-up with PCP.

## 2021-10-12 ENCOUNTER — Other Ambulatory Visit (HOSPITAL_COMMUNITY): Payer: Self-pay | Admitting: Physician Assistant

## 2021-10-12 ENCOUNTER — Encounter (HOSPITAL_COMMUNITY): Payer: Self-pay | Admitting: Physician Assistant

## 2021-10-13 LAB — PNH PROFILE (-HIGH SENSITIVITY)

## 2021-10-14 ENCOUNTER — Other Ambulatory Visit: Payer: Self-pay | Admitting: Internal Medicine

## 2021-10-14 DIAGNOSIS — E1169 Type 2 diabetes mellitus with other specified complication: Secondary | ICD-10-CM

## 2021-10-16 ENCOUNTER — Encounter: Payer: Self-pay | Admitting: Family Medicine

## 2021-10-16 DIAGNOSIS — F322 Major depressive disorder, single episode, severe without psychotic features: Secondary | ICD-10-CM | POA: Insufficient documentation

## 2021-10-16 DIAGNOSIS — F339 Major depressive disorder, recurrent, unspecified: Secondary | ICD-10-CM | POA: Insufficient documentation

## 2021-10-16 NOTE — Assessment & Plan Note (Signed)
Controlled, no change in medication  

## 2021-10-16 NOTE — Assessment & Plan Note (Signed)
F/u with pCP

## 2021-10-16 NOTE — Assessment & Plan Note (Signed)
  Patient re-educated about  the importance of commitment to a  minimum of 150 minutes of exercise per week as able.  The importance of healthy food choices with portion control discussed, as well as eating regularly and within a 12 hour window most days. The need to choose "clean , green" food 50 to 75% of the time is discussed, as well as to make water the primary drink and set a goal of 64 ounces water daily.       10/11/2021    1:04 PM 10/07/2021    8:18 AM 10/06/2021    9:17 AM  Weight /BMI  Weight 483 lb 0.6 oz 491 lb 491 lb 6.5 oz  Height '5\' 11"'$  (1.803 m) '5\' 11"'$  (1.803 m) '5\' 11"'$  (1.803 m)  BMI 67.37 kg/m2 68.48 kg/m2 68.54 kg/m2

## 2021-10-18 ENCOUNTER — Encounter: Payer: Self-pay | Admitting: Internal Medicine

## 2021-10-20 ENCOUNTER — Other Ambulatory Visit: Payer: Self-pay | Admitting: Internal Medicine

## 2021-10-20 ENCOUNTER — Other Ambulatory Visit: Payer: Self-pay | Admitting: *Deleted

## 2021-10-20 ENCOUNTER — Encounter: Payer: Self-pay | Admitting: Family Medicine

## 2021-10-20 DIAGNOSIS — I110 Hypertensive heart disease with heart failure: Secondary | ICD-10-CM | POA: Diagnosis not present

## 2021-10-20 DIAGNOSIS — I5032 Chronic diastolic (congestive) heart failure: Secondary | ICD-10-CM | POA: Diagnosis not present

## 2021-10-20 DIAGNOSIS — K219 Gastro-esophageal reflux disease without esophagitis: Secondary | ICD-10-CM | POA: Diagnosis not present

## 2021-10-20 DIAGNOSIS — Z01818 Encounter for other preprocedural examination: Secondary | ICD-10-CM | POA: Diagnosis not present

## 2021-10-20 DIAGNOSIS — I482 Chronic atrial fibrillation, unspecified: Secondary | ICD-10-CM | POA: Diagnosis not present

## 2021-10-20 DIAGNOSIS — I272 Pulmonary hypertension, unspecified: Secondary | ICD-10-CM | POA: Diagnosis not present

## 2021-10-20 DIAGNOSIS — F419 Anxiety disorder, unspecified: Secondary | ICD-10-CM | POA: Diagnosis not present

## 2021-10-20 DIAGNOSIS — J449 Chronic obstructive pulmonary disease, unspecified: Secondary | ICD-10-CM | POA: Diagnosis not present

## 2021-10-20 DIAGNOSIS — L97921 Non-pressure chronic ulcer of unspecified part of left lower leg limited to breakdown of skin: Secondary | ICD-10-CM

## 2021-10-20 DIAGNOSIS — D500 Iron deficiency anemia secondary to blood loss (chronic): Secondary | ICD-10-CM | POA: Diagnosis not present

## 2021-10-20 MED ORDER — DOXYCYCLINE HYCLATE 100 MG PO TABS: 100.0000 mg | ORAL_TABLET | Freq: Two times a day (BID) | ORAL | 0 refills | Status: DC

## 2021-10-21 ENCOUNTER — Telehealth: Payer: Self-pay | Admitting: Cardiology

## 2021-10-21 DIAGNOSIS — R040 Epistaxis: Secondary | ICD-10-CM | POA: Diagnosis not present

## 2021-10-21 NOTE — Telephone Encounter (Signed)
Based on the previous message in March, patient has been restarted on Xarelto for history of atrial fibrillation.  Clinical pharmacist to review Xarelto

## 2021-10-21 NOTE — Telephone Encounter (Signed)
   Name: Reginald [name]  DOB: September 18, 1970  MRN: [id number]   Primary Cardiologist: Carlyle Dolly, MD  Chart reviewed as part of pre-operative protocol coverage. Patient was contacted 10/21/2021 in reference to pre-operative risk assessment for pending surgery as outlined below.  Reginald [name] was last seen on 07/27/2021 by Bernerd Pho, PA-C.  Since that day, Reginald [name] has done well, he still has occasional chest discomfort or shortness of breath however it is not getting worse and is likely related to severe anemia.  He has clean coronary arteries on previous cardiac catheterization in February 2019.Marland Kitchen  Therefore, based on ACC/AHA guidelines, the patient would be at acceptable risk for the planned procedure without further cardiovascular testing.   He may hold his Xarelto for 2 days prior to the procedure and restart as soon as possible afterward at his GI doctor's discretion.  The patient was advised that if he develops new symptoms prior to surgery to contact our office to arrange for a follow-up visit, and he verbalized understanding.  I will route this recommendation to the requesting party via Epic fax function and remove from pre-op pool. Please call with questions.  [place], [place] 10/21/2021, 2:46 PM

## 2021-10-21 NOTE — Telephone Encounter (Signed)
Patient with diagnosis of afib and PE on Xarelto for anticoagulation.    Procedure: small bowel enteroscopy Date of procedure: 10/27/21  CHA2DS2-VASc Score = 4  This indicates a 4.8% annual risk of stroke. [name] score is based upon: CHF History: 1 HTN History: 1 Diabetes History: 1 Stroke History: 0 Vascular Disease History: 1 Age Score: 0 Gender Score: 0  Bilateral PE 08/2017, changed from warfarin to Xarelto at that time.  CrCl >161m/min Platelet count 226K  Per office protocol, patient can hold Xarelto for 2 days prior to procedure.

## 2021-10-21 NOTE — Telephone Encounter (Signed)
   Pre-operative Risk Assessment    Patient Name: Reginald [name]  DOB: August 17, 1970 MRN: [id number]      Request for Surgical Clearance    Procedure: Small Bowel Enteroscopy  2. When is this surgery scheduled? Press F2 to enter date below and place date in Reason for Visit (see directions below). :1} Date of Surgery:  Clearance 10/27/21                                 Surgeon:  name of MD is not listed  Surgeon's Group or Practice Name:  [facility]  Phone number:  [phone] Fax number:  [phone]   Type of Clearance Requested:   - Pharmacy:  Hold Rivaroxaban (Xarelto) ?   Type of Anesthesia:  Not Indicated   Additional requests/questions:   patient has a appointment scheduled on 10/28/2021  with Bernerd Pho after the scheduled procedure   Signed, Tildon Husky   10/21/2021, 8:59 AM

## 2021-10-24 ENCOUNTER — Encounter: Payer: Self-pay | Admitting: Orthopedic Surgery

## 2021-10-24 ENCOUNTER — Ambulatory Visit: Payer: Medicare HMO | Admitting: Orthopedic Surgery

## 2021-10-24 DIAGNOSIS — I87333 Chronic venous hypertension (idiopathic) with ulcer and inflammation of bilateral lower extremity: Secondary | ICD-10-CM | POA: Diagnosis not present

## 2021-10-24 DIAGNOSIS — I890 Lymphedema, not elsewhere classified: Secondary | ICD-10-CM | POA: Diagnosis not present

## 2021-10-24 NOTE — [facility] Notes (Signed)
Office Visit Note   Patient: Reginald [name]           Date of Birth: 1971/01/26           MRN: [id number] Visit Date: 10/24/2021              Requested by: [name], MD [address] [place],  [place] [address] PCP: [name], MD  Chief Complaint  Patient presents with   Left Leg - Pain   Right Leg - Pain      HPI: Patient is a 51 year old gentleman who is seen for initial evaluation for swelling both lower extremities with ulcerations of both calfs with weeping edema.  Patient states the ulcer is worse on the left than the right.  Patient has been prescribed doxycycline but has not picked this up yet.  Patient states he has lymphedema pumps at home but has not used them since the blisters started.  Assessment & Plan: Visit Diagnoses:  1. 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 Lymphedema     Plan: Recommended using the lymphedema pumps twice a day an hour at a time.  He can hold on the doxycycline there is no infection.  Both legs are wrapped with Dynaflex.  Follow-Up Instructions: Return in about 1 week (around 10/31/2021).   Ortho Exam  Patient is alert, oriented, no adenopathy, well-dressed, normal affect, normal respiratory effort. Examination patient has strong dorsalis pedis pulse bilaterally.  He has massive venous and lymphatic insufficiency of both lower extremities the right calf is 65 cm in circumference left calf is 68 cm in circumference.  Patient has multiple small superficial ulcers on both legs but has a large superficial ulcer on the left leg that is 5 cm in diameter.  There is no cellulitis odor or drainage no tunneling.  Imaging: No results found. No images are attached to the encounter.  Labs: Lab Results  Component Value Date   HGBA1C 5.7 (H) 06/28/2021   HGBA1C 6.4 (H) 03/24/2021   HGBA1C 5.4 11/12/2020   REPTSTATUS 07/25/2021 FINAL 07/19/2021   REPTSTATUS  07/25/2021 FINAL 07/19/2021   GRAMSTAIN  09/19/2017    NO WBC SEEN NO ORGANISMS SEEN Performed at [facility], [place] [address] [place], [place] [address]    CULT  07/19/2021    NO GROWTH 6 DAYS Performed at [facility], [address], [place], [place] [address]    CULT  07/19/2021    NO GROWTH 6 DAYS Performed at [facility], [address] [place], [place] [address]    LABORGA ESCHERICHIA COLI (A) 03/07/2019     Lab Results  Component Value Date   ALBUMIN 4.0 09/14/2021   ALBUMIN 4.0 07/26/2021   ALBUMIN 3.7 07/19/2021    Lab Results  Component Value Date   MG 2.0 03/25/2021   MG 2.2 11/15/2020   MG 2.2 11/14/2020   Lab Results  Component Value Date   VD25OH 24.8 (L) 05/13/2021    No results found for: PREALBUMIN    Latest Ref Rng & Units 10/10/2021    7:58 AM 09/14/2021   12:40 PM 09/06/2021   11:29 AM  CBC EXTENDED  WBC 4.0 - 10.5 K/uL 9.6   9.4   9.6    RBC 4.22 - 5.81 MIL/uL 4.44   4.58     4.55   4.41    Hemoglobin 13.0 - 17.0 g/dL 11.1   11.9  11.6    HCT 39.0 - 52.0 % 37.8   37.7   36.8    Platelets 150 - 400 K/uL 226   276   250    NEUT# 1.7 - 7.7 K/uL 6.8   6.5     Lymph# 0.7 - 4.0 K/uL 1.8   2.1        There is no height or weight on file to calculate BMI.  Orders:  No orders of the defined types were placed in this encounter.  No orders of the defined types were placed in this encounter.    Procedures: No procedures performed  Clinical Data: No additional findings.  ROS:  All other systems negative, except as noted in the HPI. Review of Systems  Objective: Vital Signs: There were no vitals taken for this visit.  Specialty Comments:  No specialty comments available.  PMFS History: Patient Active Problem List   Diagnosis Date Noted   Depression, major, single episode, severe ([place]) 10/16/2021   Leg ulcer, left ([place]) 10/11/2021   Symptomatic anemia    Acute renal failure superimposed on stage 2 chronic kidney disease  ([place]) 07/19/2021   Chest pain 07/19/2021   Hypotension due to hypovolemia 07/19/2021   S/P nasal septoplasty 07/01/2021   Wound cellulitis 06/09/2021   SOB (shortness of breath) 06/09/2021   Pain due to onychomycosis of toenails of both feet 05/02/2021   Diabetic neuropathy ([place]) 05/02/2021   Callus of foot 05/02/2021   BPH with obstruction/lower urinary tract symptoms 04/05/2021   Acute on chronic diastolic CHF (congestive heart failure) ([place]) 03/25/2021   Morbid obesity with BMI of 60.0-69.9, adult ([place]) 03/25/2021   Mixed hyperlipidemia 03/24/2021   Atrial fibrillation, chronic ([place]) 03/24/2021   Prolonged QT interval 03/24/2021   Iron deficiency anemia 02/21/2021   Constipation 02/21/2021   Chronic blood loss anemia 11/12/2020   Gastrointestinal hemorrhage 10/08/2020   S/P placement of cardiac pacemaker 02/10/2020   Encounter for examination following treatment at hospital 02/10/2020   Anxiety 02/10/2020   DM2 (diabetes mellitus, type 2) ([place]) 02/10/2020   OSA on CPAP    History of pulmonary embolism 03/16/2019   Paroxysmal atrial fibrillation ([place]) 09/05/2018   COPD (chronic obstructive pulmonary disease) ([place]) 05/07/2018   Gastroesophageal reflux disease    Pulmonary embolism ([place]) 09/18/2017   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 08/23/2017   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   Pulmonary hypertension ([place])    Chronic periodontitis 07/18/2017   Morbid obesity ([place])    Essential hypertension    Tobacco abuse    Chronic heart failure with preserved ejection fraction (HFpEF) ([place])    Chronic venous insufficiency    Past Medical History:  Diagnosis Date   Anemia    Anxiety    Aortic stenosis    Arthritis    Asthma    Back pain    Cellulitis of skin with lymphangitis    CHF (congestive heart failure) (HCC)    Chronic diastolic congestive heart failure (HCC)    Chronic venous insufficiency     Constipation    COPD (chronic obstructive pulmonary disease) ([place])    Coronary artery disease with history of myocardial infarction without history of CABG    Depression    Diabetes ([place])    Dyspnea    Essential hypertension    Gout    Heart murmur    Hypertension    Morbid obesity ([place])    Obesity  Pneumonia    walking pneumonia   Prostatitis    Pulmonary embolism (HCC)    Pulmonary hypertension (HCC)    Pyelonephritis    S/P aortic valve replacement with bioprosthetic valve 08/23/2017   25 mm Edwards Inspiris Resilia stented bovine pericardial tissue valve   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   24 mm Hemashield supracoronary straight graft    Sleep apnea    cpap    Thoracic ascending aortic aneurysm [facility])    Thoracic ascending aortic aneurysm (HCC)    Tobacco abuse     Family History  Problem Relation Age of Onset   Hypertension Mother    Alzheimer's disease Mother    Heart attack Mother    Heart attack Brother    Diabetes Brother     Past Surgical History:  Procedure Laterality Date   AORTIC VALVE REPLACEMENT N/A 08/23/2017   Procedure: AORTIC VALVE REPLACEMENT (AVR) USING INSPIRIS RESILIA AORTIC VALVE SIZE 25 MM;  Surgeon: Rexene Alberts, MD;  Location: [facility];  Service: Open Heart Surgery;  Laterality: N/A;   CARDIAC VALVE REPLACEMENT N/A    [employer] 02/07/2020   COLONOSCOPY WITH PROPOFOL N/A 11/14/2020   Procedure: COLONOSCOPY WITH PROPOFOL;  Surgeon: Gatha Mayer, MD;  Location: [facility] ENDOSCOPY;  Service: Endoscopy;  Laterality: N/A;   ESOPHAGOGASTRODUODENOSCOPY (EGD) WITH PROPOFOL N/A 11/14/2020   Procedure: ESOPHAGOGASTRODUODENOSCOPY (EGD) WITH PROPOFOL;  Surgeon: Gatha Mayer, MD;  Location: [facility];  Service: Endoscopy;  Laterality: N/A;   GIVENS CAPSULE STUDY N/A 06/30/2021   Procedure: GIVENS CAPSULE STUDY;  Surgeon: Harvel Quale, MD;  [facility];  Service: Gastroenterology;  Laterality: N/A;  7:30   MULTIPLE  EXTRACTIONS WITH ALVEOLOPLASTY N/A 07/23/2017   Procedure: Extraction of tooth #10 with alveoloplasty and gross debridement of remaining teeth;  Surgeon: Lenn Cal, DDS;  Location: [facility];  Service: Oral Surgery;  Laterality: N/A;   NASAL SEPTOPLASTY W/ TURBINOPLASTY Bilateral 07/01/2021   Procedure: NASAL SEPTOPLASTY WITH TURBINATE REDUCTION;  Surgeon: Leta Baptist, MD;  Location: [facility];  Service: ENT;  Laterality: Bilateral;   PACEMAKER IMPLANT N/A 08/28/2017    St Jude Medical Assurity MRI conditional  dual-chamber pacemaker for symptomatic complete heart blockby Dr Rayann Heman   TEE WITHOUT CARDIOVERSION N/A 07/16/2017   Procedure: TRANSESOPHAGEAL ECHOCARDIOGRAM (TEE);  Surgeon: Lelon Perla, MD;  Location[facility] END[facility]COPY;  Service: Cardiovascular;  Laterality: N/A;   TEE WITHOUT CARDIOVERSION N/A 08/23/2017   Procedure: TRANSESOPHAGEAL ECHOCARDIOGRAM (TEE);  Surgeon: Rexene Alberts, MD;  Location: [facility];  Service: Open Heart Surgery;  Laterality: N/A;   THORACIC AORTIC ANEURYSM REPAIR N/A 08/23/2017   Procedure: THORACIC ASCENDING ANEURYSM REPAIR (AAA) USING HEMASHIELD GOLD KNITTED MICROVEL DOUBLE VELOUR VASCULAR GRAFT D: 24 MM  L: 30 CM;  Surgeon: Rexene Alberts, MD;  Location: [facility];  Service: Open Heart Surgery;  Laterality: N/A;   Social History   Occupational History   Not on file  Tobacco Use   Smoking status: Former    Packs/day: 2.00    Years: 16.00    Pack years: 32.00    Types: Cigarettes    Quit date: 08/23/2017    Years since quitting: 4.1   Smokeless tobacco: Never  Vaping Use   Vaping Use: Never used  Substance and Sexual Activity   Alcohol use: No    Comment: have had alcohol in the past, not heavy   Drug use: No   Sexual activity: Not Currently    Partners: Female

## 2021-10-27 DIAGNOSIS — K3189 Other diseases of stomach and duodenum: Secondary | ICD-10-CM | POA: Diagnosis not present

## 2021-10-27 DIAGNOSIS — I480 Paroxysmal atrial fibrillation: Secondary | ICD-10-CM | POA: Diagnosis not present

## 2021-10-27 DIAGNOSIS — R896 Abnormal cytological findings in specimens from other organs, systems and tissues: Secondary | ICD-10-CM | POA: Diagnosis not present

## 2021-10-27 DIAGNOSIS — D509 Iron deficiency anemia, unspecified: Secondary | ICD-10-CM | POA: Diagnosis not present

## 2021-10-27 DIAGNOSIS — L97929 Non-pressure chronic ulcer of unspecified part of left lower leg with unspecified severity: Secondary | ICD-10-CM | POA: Diagnosis not present

## 2021-10-27 DIAGNOSIS — G4733 Obstructive sleep apnea (adult) (pediatric): Secondary | ICD-10-CM | POA: Diagnosis not present

## 2021-10-27 DIAGNOSIS — K2950 Unspecified chronic gastritis without bleeding: Secondary | ICD-10-CM | POA: Diagnosis not present

## 2021-10-27 DIAGNOSIS — E11622 Type 2 diabetes mellitus with other skin ulcer: Secondary | ICD-10-CM | POA: Diagnosis not present

## 2021-10-27 DIAGNOSIS — K2960 Other gastritis without bleeding: Secondary | ICD-10-CM | POA: Diagnosis not present

## 2021-10-27 DIAGNOSIS — I7121 Aneurysm of the ascending aorta, without rupture: Secondary | ICD-10-CM | POA: Diagnosis not present

## 2021-10-27 DIAGNOSIS — I110 Hypertensive heart disease with heart failure: Secondary | ICD-10-CM | POA: Diagnosis not present

## 2021-10-27 NOTE — Progress Notes (Signed)
Cardiology Office Note    Date:  10/28/2021   ID:  Reginald [name], DOB 1970-08-10, MRN [id number]  PCP:  [name], MD  Cardiologist: Carlyle Dolly, MD    Chief Complaint  Patient presents with   Follow-up    3 month visit    History of Present Illness:    Reginald [name] is a 51 y.o. male with past medical history of severe AS (s/p bovine tissue AVR in 08/2017 with repair of ascending thoracic aortic aneurysm), normal coronary arteries by cardiac catheterization in 2019, HFpEF, paroxysmal atrial fibrillation, CHB (s/p St. Jude PPM placement in 08/2017), history of bilateral PE, HTN, morbid obesity and OSA (on CPAP) who presents to the office today for 32-monthfollow-up.  He was last examined by myself in 07/2021 and reported having baseline dyspnea on exertion and generalized weakness and had been experiencing recent melena. Follow-up labs were obtained and he was cleared from GI's perspective to restart Xarelto. [facility]   In talking with the patient today, he reports feeling fatigued and dizzy which has been occurring for several months and did not improve with reduction of diuretic dosing. Says that he is no longer having melena or hematochezia. [facility] issues which would cause his prior bleeding. He has baseline dyspnea on exertion but says he only ever walks for ~ 30 seconds at a time due to his dyspnea.  We reviewed the role that deconditioning could be playing on the cycle and how this is impacting his activity level. He does experience intermittent palpitations and was previously having occasional episodes of shooting chest pain.  Past Medical History:  Diagnosis Date   Anemia    Anxiety    Aortic stenosis    Arthritis    Asthma    Back pain    Cellulitis of skin with lymphangitis    CHF (congestive heart failure) (HCC)    Chronic  diastolic congestive heart failure (HCC)    Chronic venous insufficiency    Constipation    COPD (chronic obstructive pulmonary disease) (HCC)    Coronary artery disease with history of myocardial infarction without history of CABG    Depression    Diabetes (HCC)    Dyspnea    Essential hypertension    Gout    Heart murmur    Hypertension    Morbid obesity ([place]    Obesity    Pneumonia    walking pneumonia   Prostatitis    Pulmonary embolism ([place]    Pulmonary hypertension (HCC)    Pyelonephritis    S/P aortic valve replacement with bioprosthetic valve 08/23/2017   25 mm Edwards Inspiris Resilia stented bovine pericardial tissue valve   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   24 mm Hemashield supracoronary straight graft    Sleep apnea    cpap    Thoracic ascending aortic aneurysm ([place]    Thoracic ascending aortic aneurysm (HCC)    Tobacco abuse     Past Surgical History:  Procedure Laterality Date   AORTIC VALVE REPLACEMENT N/A 08/23/2017   Procedure: AORTIC VALVE REPLACEMENT (AVR) USING INSPIRIS RESILIA AORTIC VALVE SIZE 25 MM;  Surgeon: ORexene Alberts MD;  [place]  Service: Open Heart Surgery;  Laterality: N/A;   CARDIAC VALVE REPLACEMENT N/A    Phreesia 02/07/2020   COLONOSCOPY WITH PROPOFOL  N/A 11/14/2020   Procedure: COLONOSCOPY WITH PROPOFOL;  Surgeon: Gatha Mayer, MD;  [facility];  Service: Endoscopy;  Laterality: N/A;   ESOPHAGOGASTRODUODENOSCOPY (EGD) WITH PROPOFOL N/A 11/14/2020   Procedure: ESOPHAGOGASTRODUODENOSCOPY (EGD) WITH PROPOFOL;  Surgeon: Gatha Mayer, MD;  Location: [place];  Service: Endoscopy;  Laterality: N/A;   GIVENS CAPSULE STUDY N/A 06/30/2021   Procedure: GIVENS CAPSULE STUDY;  Surgeon: Harvel Quale, MD;  [facility];  Service: Gastroenterology;  Laterality: N/A;  7:30   MULTIPLE EXTRACTIONS WITH ALVEOLOPLASTY N/A 07/23/2017   Procedure: Extraction of tooth #10 with alveoloplasty  and gross debridement of remaining teeth;  Surgeon: Lenn Cal, DDS;  Location: [place];  Service: Oral Surgery;  Laterality: N/A;   NASAL SEPTOPLASTY W/ TURBINOPLASTY Bilateral 07/01/2021   Procedure: NASAL SEPTOPLASTY WITH TURBINATE REDUCTION;  Surgeon: Leta Baptist, MD;  Location: [place];  Service: ENT;  Laterality: Bilateral;   PACEMAKER IMPLANT N/A 08/28/2017    St Jude Medical Assurity MRI conditional  dual-chamber pacemaker for symptomatic complete heart blockby Dr Rayann Heman   TEE WITHOUT CARDIOVERSION N/A 07/16/2017   Procedure: TRANSESOPHAGEAL ECHOCARDIOGRAM (TEE);  Surgeon: Lelon Perla, MD;  Location: [facility] ENDOSCOPY;  Service: Cardiovascular;  Laterality: N/A;   TEE WITHOUT CARDIOVERSION N/A 08/23/2017   Procedure: TRANSESOPHAGEAL ECHOCARDIOGRAM (TEE);  Surgeon: Rexene Alberts, MD;  Location: [place];  Service: Open Heart Surgery;  Laterality: N/A;   THORACIC AORTIC ANEURYSM REPAIR N/A 08/23/2017   Procedure: THORACIC ASCENDING ANEURYSM REPAIR (AAA) USING HEMASHIELD GOLD KNITTED MICROVEL DOUBLE VELOUR VASCULAR GRAFT D: 24 MM  L: 30 CM;  Surgeon: Rexene Alberts, MD;  Location: [place];  Service: Open Heart Surgery;  Laterality: N/A;    Current Medications: Outpatient Medications Prior to Visit  Medication Sig Dispense Refill   acetaminophen (TYLENOL) 325 MG tablet Take 650 mg by mouth every 6 (six) hours as needed for mild pain.     albuterol (PROVENTIL) (2.5 MG/3ML) 0.083% nebulizer solution INHALE THE CONTENTS OF 1 VIAL VIA NEBULIZER EVERY 6 HOURS AS NEEDED FOR WHEEZING OR SHORTNESS OF BREATH 270 mL 1   albuterol (VENTOLIN HFA) 108 (90 Base) MCG/ACT inhaler INHALE 2 PUFFS INTO THE LUNGS EVERY 6 (SIX) HOURS AS NEEDED FOR WHEEZING OR SHORTNESS OF BREATH. 1 each 1   allopurinol (ZYLOPRIM) 300 MG tablet TAKE 1 TABLET EVERY DAY 90 tablet 3   atorvastatin (LIPITOR) 40 MG tablet TAKE 1 TABLET EVERY DAY 90 tablet 3   Blood Glucose Monitoring Suppl (TRUE METRIX METER) w/Device KIT USE AS  DIRECTED 1 kit 0   buPROPion (WELLBUTRIN XL) 300 MG 24 hr tablet Take 1 tablet (300 mg total) by mouth daily. 90 tablet 3   Cholecalciferol 125 MCG (5000 UT) TABS Take 5,000 Units by mouth daily.     docusate sodium (COLACE) 100 MG capsule Take 2 capsules (200 mg total) by mouth 2 (two) times daily. (Patient taking differently: Take 200 mg by mouth daily.) 30 capsule 0   doxycycline (VIBRA-TABS) 100 MG tablet Take 1 tablet (100 mg total) by mouth 2 (two) times daily. 20 tablet 0   FEROSUL 325 (65 Fe) MG tablet TAKE 1 TABLET EVERY DAY 90 tablet 1   fexofenadine (ALLEGRA) 180 MG tablet Take 180 mg by mouth daily.     gabapentin (NEURONTIN) 300 MG capsule Take 1 capsule (300 mg total) by mouth 3 (three) times daily. 270 capsule 0   hydrOXYzine (ATARAX) 25 MG tablet TAKE 1 TABLET BY MOUTH EVERY  8 HOURS AS NEEDED 90 tablet 0   Krill Oil 350 MG CAPS Take 350 mg by mouth daily.     metFORMIN (GLUCOPHAGE) 850 MG tablet TAKE 1 TABLET TWICE DAILY WITH MEALS 180 tablet 2   metolazone (ZAROXOLYN) 5 MG tablet TAKE 1 TABLET (5 MG TOTAL) BY MOUTH THREE TIMES A WEEK. (Patient taking differently: Take 5 mg by mouth See admin instructions. TAKE 1 TABLET (5 MG TOTAL) BY MOUTH THREE TIMES A WEEK AS NEEDED) 36 tablet 3   metoprolol tartrate (LOPRESSOR) 100 MG tablet TAKE 1 TABLET TWICE DAILY 180 tablet 3   Misc. Devices MISC Sequential compression device - 1 pair. Apply them for 1 hour in the morning and 1 hour in the evening. 2 each 0   pantoprazole (PROTONIX) 40 MG tablet TAKE 1 TABLET EVERY DAY 90 tablet 3   potassium chloride SA (KLOR-CON) 20 MEQ tablet TAKE 3 TABS DAILY AND TAKE AN ADDITIONAL 1 TAB ON THE DAY YOU TAKE YOUR WEEKLY METOLAZONE DOSE 283 tablet 3   rivaroxaban (XARELTO) 20 MG TABS tablet Take 20 mg by mouth daily.     Semaglutide, 2 MG/DOSE, (OZEMPIC, 2 MG/DOSE,) 8 MG/3ML SOPN Inject 2 mg into the skin once a week.     tamsulosin (FLOMAX) 0.4 MG CAPS capsule Take 1 capsule (0.4 mg total) by mouth 2  (two) times daily. 60 capsule 11   Tiotropium Bromide Monohydrate (SPIRIVA RESPIMAT) 2.5 MCG/ACT AERS Inhale 2 puffs into the lungs daily. 4 g 5   TRUE METRIX BLOOD GLUCOSE TEST test strip TEST ONE TIME DAILY FOR DIABETES 100 strip 5   TRUEplus Lancets 33G MISC USE TO TEST ONE TIME DAILY FOR DIABETES 100 each 5   TURMERIC-GINGER PO Take 2 each by mouth daily.     torsemide (DEMADEX) 20 MG tablet Take 2 tablets (40 mg total) by mouth daily for 30 doses. (Patient taking differently: Take 40 mg by mouth 2 (two) times daily.) 60 tablet 0   No facility-administered medications prior to visit.     Allergies:   Patient has no known allergies.   Social History   Socioeconomic History   Marital status: Divorced    Spouse name: Not on file   Number of children: Not on file   Years of education: Not on file   Highest education level: Not on file  Occupational History   Not on file  Tobacco Use   Smoking status: Former    Packs/day: 2.00    Years: 16.00    Total pack years: 32.00    Types: Cigarettes    Quit date: 08/23/2017    Years since quitting: 4.1   Smokeless tobacco: Never  Vaping Use   Vaping Use: Never used  Substance and Sexual Activity   Alcohol use: No    Comment: have had alcohol in the past, not heavy   Drug use: No   Sexual activity: Not Currently    Partners: Female  Other Topics Concern   Not on file  Social History Narrative   Not on file   Social Determinants of Health   Financial Resource Strain: Low Risk  (03/14/2021)   Overall Financial Resource Strain (CARDIA)    Difficulty of Paying Living Expenses: Not hard at all  Food Insecurity: No Food Insecurity (03/14/2021)   Hunger Vital Sign    Worried About Running Out of Food in the Last Year: Never true    [place] in the Last Year: Never true  Transportation Needs: No Transportation Needs (03/14/2021)   PRAPARE - Hydrologist (Medical): No    Lack of Transportation  (Non-Medical): No  Physical Activity: Inactive (03/14/2021)   Exercise Vital Sign    Days of Exercise per Week: 0 days    Minutes of Exercise per Session: 0 min  Stress: No Stress Concern Present (03/14/2021)   [place]    Feeling of Stress : Only a little  Social Connections: Socially Isolated (03/14/2021)   Social Connection and Isolation Panel [NHANES]    Frequency of Communication with Friends and Family: Once a week    Frequency of Social Gatherings with Friends and Family: Never    Attends Religious Services: Never    Printmaker: No    Attends Music therapist: More than 4 times per year    Marital Status: Never married     Family History:  [name] family history includes Alzheimer's disease in his mother; Diabetes in his brother; Heart attack in his brother and mother; Hypertension in his mother.   Review of Systems:    Please see the history of present illness.     All other systems reviewed and are otherwise negative except as noted above.   Physical Exam:    VS:  BP 128/72 (BP Location: Left Arm)   Pulse 73   Ht _0  (1.803 m)   Wt (!) 490 lb (222.3 kg)   SpO2 97%   BMI 68.34 kg/m    General: Pleasant obese male appearing in no acute distress. Head: Normocephalic, atraumatic. Neck: No carotid bruits. JVD difficult to assess.  Lungs: Respirations regular and unlabored, without wheezes or rales.  Heart: Regular rate and rhythm. No S3 or S4. 2/6 SEM along RUSB.  Abdomen: Appears non-distended. No obvious abdominal masses. Msk:  Strength and tone appear normal for age. No obvious joint deformities or effusions. Extremities: No clubbing or cyanosis. Trace ankle edema.  Distal pedal pulses are 2+ bilaterally. Neuro: Alert and oriented X 3. Moves all extremities spontaneously. No focal deficits noted. Psych:  Responds to questions appropriately with a  normal affect. Skin: No rashes or lesions noted  Wt Readings from Last 3 Encounters:  10/28/21 (!) 490 lb (222.3 kg)  10/11/21 (!) 483 lb 0.6 oz (219.1 kg)  10/07/21 (!) 491 lb (222.7 kg)    Studies/Labs Reviewed:   EKG:  EKG is not ordered today.  Recent Labs: 01/30/2021: B Natriuretic Peptide 67.0 03/25/2021: Magnesium 2.0 05/13/2021: TSH 3.920 09/14/2021: ALT 18 09/21/2021: BUN 23; Creatinine, Ser 1.26; Potassium 3.5; Sodium 137 10/10/2021: Hemoglobin 11.1; Platelets 226   Lipid Panel    Component Value Date/Time   CHOL 97 03/25/2021 0539   CHOL 123 06/10/2020 0837   TRIG 138 03/25/2021 0539   HDL 27 (L) 03/25/2021 0539   HDL 25 (L) 06/10/2020 0837   CHOLHDL 3.6 03/25/2021 0539   VLDL 28 03/25/2021 0539   LDLCALC 42 03/25/2021 0539   LDLCALC 54 06/10/2020 0837    Additional studies/ records that were reviewed today include:   Echocardiogram: 07/2021 IMPRESSIONS     1. Left ventricular ejection fraction, by estimation, is 60 to 65%. The  left ventricle has normal function. Left ventricular endocardial border  not optimally defined to evaluate regional wall motion. There is moderate  concentric left ventricular  hypertrophy. Left ventricular diastolic parameters are consistent with  Grade II diastolic dysfunction (pseudonormalization).  Elevated left atrial  pressure.   2. Right ventricular systolic function is normal. The right ventricular  size is mildly enlarged. Tricuspid regurgitation signal is inadequate for  assessing PA pressure.   3. Right atrial size was mildly dilated.   4. The mitral valve was not well visualized. No evidence of mitral valve  regurgitation.   5. The aortic valve has been replaced by a 25 mm Edwards valve Aortic  valve regurgitation is not visualized. Mean gradient 18 mm Hg. Only one  Doppler tracing of AS showed increased acceleration time. 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   6. Aortic root/ascending aorta has been repaired/replaced.   Comparison(s): RV  function has improved. Stable prosthetic aortic  parameters.   Assessment:    1. Chronic diastolic (congestive) heart failure ([place])   2. Dizziness   3. 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   4. PAF (paroxysmal atrial fibrillation) ([place])   5. Essential hypertension   6. OSA on CPAP      Plan:   In order of problems listed above:  1. HFpEF - His fluid status has overall been stable for the past several weeks and he has been taking Torsemide 40 mg daily along with intermittent Metolazone but does not take this regularly due to concerns in regard to his kidney function. His creatinine had improved to 1.26 in 09/2021 and he is scheduled for follow-up labs next month  2. Dizziness - He reports intermittent dizziness which is typically worse with positional changes. Checked orthostatics in the office today and they were negative. I do not think this is secondary to diuretic use as we previously decreased his diuretic and also stopped SGLT2 inhibitor therapy in the setting of frequent UTI's and he did not notice any change in symptoms with this. Echocardiogram in 07/2021 was reassuring and showed a preserved EF with normal valve function. He also has a pacemaker in place and recent device interrogation showed no significant arrhythmias. He was previously referred to Neurology but has not yet been scheduled for an appointment with them. Provided with the contact information today. I feel like deconditioning is playing a large role in his symptoms and he is not able to walk for more than 30+ seconds at a time due to his dyspnea and leg weakness. Recommended gradually trying to increase his activity level.   3. Severe Aortic Stenosis - He is s/p bovine tissue AVR in 08/2017 with repair of ascending thoracic aortic aneurysm. Echo in 07/2021 showed his valve was functioning normally.   4. Paroxysmal Atrial Fibrillation - He reports  occasional palpitations but recent device interrogation in 08/2021 showed only 1 episode of PAT which lasted for seconds in duration with no persistent arrhythmias. Will continue current medical therapy for now with Lopressor 100 mg twice daily along with Xarelto 20 mg daily for anticoagulation.  5. HTN - His BP is well controlled at 128/72 during today's visit. Continue current medical therapy with Lopressor 100 mg twice daily and Torsemide 40 mg daily.  6. OSA - He uses his CPAP on a nightly basis and continued compliance was encouraged.    Medication Adjustments/Labs and Tests Ordered: Current medicines are reviewed at length with the patient today.  Concerns regarding medicines are outlined above.  Medication changes, Labs and Tests ordered today are listed in the Patient Instructions below. Patient Instructions  Medication Instructions:  Your physician recommends that you continue on your current medications as directed. Please refer to the Current  Medication list given to you today.  *If you need a refill on your cardiac medications before your next appointment, please call your pharmacy*   Lab Work: NONE ordered at this time of appointment  If you have labs (blood work) drawn today and your tests are completely normal, you will receive your results only by: [place] (if you have MyChart) OR A paper copy in the mail If you have any lab test that is abnormal or we need to change your treatment, we will call you to review the results.   Testing/Procedures: NONE ordered at this time of appointment     Follow-Up: At [facility], you and your health needs are our priority.  As part of our continuing mission to provide you with exceptional heart care, we have created designated Provider Care Teams.  These Care Teams include your primary Cardiologist (physician) and Advanced Practice Providers (APPs -  Physician Assistants and Nurse Practitioners) who all work together to  provide you with the care you need, when you need it.  We recommend signing up for the patient portal called "MyChart".  Sign up information is provided on this After Visit Summary.  MyChart is used to connect with patients for Virtual Visits (Telemedicine).  Patients are able to view lab/test results, encounter notes, upcoming appointments, etc.  Non-urgent messages can be sent to your provider as well.   To learn more about what you can do with MyChart, go to [email or web address].    Your next appointment:   3-4 month(s)  The format for your next appointment:   In Person  Provider:   Carlyle Dolly, MD or Bernerd Pho, PA-C    Other Instructions   Important Information About Sugar         Signed, Erma Heritage, PA-C  10/28/2021 2:57 PM    [place]. [address] [place], [place] [address] Phone: [phone] Fax: [phone]

## 2021-10-28 ENCOUNTER — Encounter: Payer: Self-pay | Admitting: Student

## 2021-10-28 ENCOUNTER — Ambulatory Visit: Payer: Medicare HMO | Admitting: Student

## 2021-10-28 ENCOUNTER — Telehealth: Payer: Self-pay

## 2021-10-28 VITALS — BP 128/72 | HR 73 | Ht 71.0 in | Wt >= 6400 oz

## 2021-10-28 DIAGNOSIS — I5032 Chronic diastolic (congestive) heart failure: Secondary | ICD-10-CM | POA: Diagnosis not present

## 2021-10-28 DIAGNOSIS — G4733 Obstructive sleep apnea (adult) (pediatric): Secondary | ICD-10-CM

## 2021-10-28 DIAGNOSIS — R42 Dizziness and giddiness: Secondary | ICD-10-CM

## 2021-10-28 DIAGNOSIS — I480 Paroxysmal atrial fibrillation: Secondary | ICD-10-CM | POA: Diagnosis not present

## 2021-10-28 DIAGNOSIS — Z9989 Dependence on other enabling machines and devices: Secondary | ICD-10-CM

## 2021-10-28 DIAGNOSIS — I10 Essential (primary) hypertension: Secondary | ICD-10-CM | POA: Diagnosis not present

## 2021-10-28 DIAGNOSIS — Z953 Presence of xenogenic heart valve: Secondary | ICD-10-CM | POA: Diagnosis not present

## 2021-10-28 MED ORDER — TORSEMIDE 20 MG PO TABS: 40.0000 mg | ORAL_TABLET | Freq: Every day | ORAL | 2 refills | Status: DC

## 2021-10-28 NOTE — Patient Instructions (Signed)
Medication Instructions:  Your physician recommends that you continue on your current medications as directed. Please refer to the Current Medication list given to you today.  *If you need a refill on your cardiac medications before your next appointment, please call your pharmacy*   Lab Work: NONE ordered at this time of appointment  If you have labs (blood work) drawn today and your tests are completely normal, you will receive your results only by: [place] (if you have MyChart) OR A paper copy in the mail If you have any lab test that is abnormal or we need to change your treatment, we will call you to review the results.   Testing/Procedures: NONE ordered at this time of appointment     Follow-Up: At [facility], you and your health needs are our priority.  As part of our continuing mission to provide you with exceptional heart care, we have created designated Provider Care Teams.  These Care Teams include your primary Cardiologist (physician) and Advanced Practice Providers (APPs -  Physician Assistants and Nurse Practitioners) who all work together to provide you with the care you need, when you need it.  We recommend signing up for the patient portal called "MyChart".  Sign up information is provided on this After Visit Summary.  MyChart is used to connect with patients for Virtual Visits (Telemedicine).  Patients are able to view lab/test results, encounter notes, upcoming appointments, etc.  Non-urgent messages can be sent to your provider as well.   To learn more about what you can do with MyChart, go to [email or web address].    Your next appointment:   3-4 month(s)  The format for your next appointment:   In Person  Provider:   Carlyle Dolly, MD or Bernerd Pho, PA-C    Other Instructions   Important Information About Sugar

## 2021-10-28 NOTE — Telephone Encounter (Signed)
Raquel Sarna called from [facility] [phone] needs referral sent back, has not received anything yet, can you fax [phone]. Since not [facility] owned.

## 2021-10-31 ENCOUNTER — Encounter: Payer: Self-pay | Admitting: Internal Medicine

## 2021-10-31 ENCOUNTER — Other Ambulatory Visit: Payer: Self-pay | Admitting: Family Medicine

## 2021-10-31 ENCOUNTER — Ambulatory Visit (INDEPENDENT_AMBULATORY_CARE_PROVIDER_SITE_OTHER): Payer: Medicare HMO | Admitting: Orthopedic Surgery

## 2021-10-31 ENCOUNTER — Telehealth: Payer: Self-pay | Admitting: Internal Medicine

## 2021-10-31 DIAGNOSIS — I87333 Chronic venous hypertension (idiopathic) with ulcer and inflammation of bilateral lower extremity: Secondary | ICD-10-CM

## 2021-10-31 DIAGNOSIS — M1000 Idiopathic gout, unspecified site: Secondary | ICD-10-CM

## 2021-10-31 MED ORDER — COLCHICINE 0.6 MG PO CAPS: ORAL_CAPSULE | ORAL | 0 refills | Status: DC

## 2021-10-31 NOTE — Telephone Encounter (Signed)
I sent him a prescription for colchicine to be picked up at his pharmacy.

## 2021-10-31 NOTE — Telephone Encounter (Signed)
Spoke to the pharmacy to have the tablet form filled if that is what insurance covers, they will go ahead and get it ready for patient.

## 2021-10-31 NOTE — Telephone Encounter (Addendum)
Reginald [name] with [name] called in on  Colchicine 0.6 MG CAPS   Patient insurance will not pay for the Capsules but will pay for the tablets.  Requesting script for tablets instead   Patient is at [facility] now waiting on med Call back info Reginald [name] [phone]

## 2021-11-01 NOTE — Telephone Encounter (Signed)
Raquel Sarna from [place] office called asked to fax over the referral or send through [facility].  Fax # [phone]. Patient is getting upset DR Merlene Laughter will work him in sooner an appointment needs referral information. [place] call Raquel Sarna at [phone] ext [phone] when faxed or sent over.

## 2021-11-04 ENCOUNTER — Other Ambulatory Visit: Payer: Self-pay | Admitting: Internal Medicine

## 2021-11-07 ENCOUNTER — Ambulatory Visit: Payer: Medicare HMO | Admitting: Orthopedic Surgery

## 2021-11-07 ENCOUNTER — Encounter: Payer: Self-pay | Admitting: Orthopedic Surgery

## 2021-11-07 DIAGNOSIS — I87333 Chronic venous hypertension (idiopathic) with ulcer and inflammation of bilateral lower extremity: Secondary | ICD-10-CM

## 2021-11-07 DIAGNOSIS — I890 Lymphedema, not elsewhere classified: Secondary | ICD-10-CM

## 2021-11-07 NOTE — Progress Notes (Signed)
Office Visit Note   Patient: Reginald [name]           Date of Birth: 1970-11-19           MRN: [id number] Visit Date: 11/07/2021              Requested by: [name], MD [address] [place],  [place] [id number] PCP: [name], MD  Chief Complaint  Patient presents with   Left Leg - Follow-up   Right Leg - Follow-up      HPI: Patient is a 51 year old gentleman with massive venous and lymphatic insufficiency swelling of both lower extremities.  Patient most recently had an a paste and a 3 layer compression wrap on top.  Assessment & Plan: Visit Diagnoses:  1. 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 Lymphedema     Plan: We will reapply the same dressings patient will need custom stockings.  His last socks came from [place] and they do not fit.  Follow-Up Instructions: Return in about 1 week (around 11/14/2021).   Ortho Exam  Patient is alert, oriented, no adenopathy, well-dressed, normal affect, normal respiratory effort. Examination the ulcers are healing nicely on the right leg and healthy granulation tissue on the left leg.  There is no redness cellulitis or drainage in either leg.  There is healthy granulation tissue of the wound on the left leg.  Left calf is the largest at 63  cm in circumference.  The right leg ulcers have healed.  Imaging: No results found. No images are attached to the encounter.  Labs: Lab Results  Component Value Date   HGBA1C 5.7 (H) 06/28/2021   HGBA1C 6.4 (H) 03/24/2021   HGBA1C 5.4 11/12/2020   REPTSTATUS 07/25/2021 FINAL 07/19/2021   REPTSTATUS 07/25/2021 FINAL 07/19/2021   GRAMSTAIN  09/19/2017    NO WBC SEEN NO ORGANISMS SEEN Performed at [facility], [place] [address] [place], [place] [address]    CULT  07/19/2021    NO GROWTH 6 DAYS Performed at [facility], [address] [place] [address], [place], [place] [address]    CULT  07/19/2021    NO GROWTH 6  DAYS Performed at [facility], [address], [place], [place] [address]    LABORGA ESCHERICHIA COLI (A) 03/07/2019     Lab Results  Component Value Date   ALBUMIN 4.0 09/14/2021   ALBUMIN 4.0 07/26/2021   ALBUMIN 3.7 07/19/2021    Lab Results  Component Value Date   MG 2.0 03/25/2021   MG 2.2 11/15/2020   MG 2.2 11/14/2020   Lab Results  Component Value Date   VD25OH 24.8 (L) 05/13/2021    No results found for: "PREALBUMIN"    Latest Ref Rng & Units 10/10/2021    7:58 AM 09/14/2021   12:40 PM 09/06/2021   11:29 AM  CBC EXTENDED  WBC 4.0 - 10.5 K/uL 9.6  9.4  9.6   RBC 4.22 - 5.81 MIL/uL 4.44  4.58    4.55  4.41   Hemoglobin 13.0 - 17.0 g/dL 11.1  11.9  11.6   HCT 39.0 - 52.0 % 37.8  37.7  36.8   Platelets 150 - 400 K/uL 226  276  250   NEUT# 1.7 - 7.7 K/uL 6.8  6.5    Lymph# 0.7 - 4.0 K/uL 1.8  2.1       There is no height or weight on file to calculate  BMI.  Orders:  No orders of the defined types were placed in this encounter.  No orders of the defined types were placed in this encounter.    Procedures: No procedures performed  Clinical Data: No additional findings.  ROS:  All other systems negative, except as noted in the HPI. Review of Systems  Objective: Vital Signs: There were no vitals taken for this visit.  Specialty Comments:  No specialty comments available.  PMFS History: Patient Active Problem List   Diagnosis Date Noted   Depression, major, single episode, severe ([place]) 10/16/2021   Leg ulcer, left ([place]) 10/11/2021   Symptomatic anemia    Acute renal failure superimposed on stage 2 chronic kidney disease ([place]) 07/19/2021   Chest pain 07/19/2021   Hypotension due to hypovolemia 07/19/2021   S/P nasal septoplasty 07/01/2021   Wound cellulitis 06/09/2021   SOB (shortness of breath) 06/09/2021   Pain due to onychomycosis of toenails of both feet 05/02/2021   Diabetic neuropathy ([place]) 05/02/2021   Callus of foot 05/02/2021   BPH  with obstruction/lower urinary tract symptoms 04/05/2021   Acute on chronic diastolic CHF (congestive heart failure) ([place]) 03/25/2021   Morbid obesity with BMI of 60.0-69.9, adult ([place]) 03/25/2021   Mixed hyperlipidemia 03/24/2021   Atrial fibrillation, chronic ([place]) 03/24/2021   Prolonged QT interval 03/24/2021   Iron deficiency anemia 02/21/2021   Constipation 02/21/2021   Chronic blood loss anemia 11/12/2020   Gastrointestinal hemorrhage 10/08/2020   S/P placement of cardiac pacemaker 02/10/2020   Encounter for examination following treatment at hospital 02/10/2020   Anxiety 02/10/2020   DM2 (diabetes mellitus, type 2) ([place]) 02/10/2020   OSA on CPAP    History of pulmonary embolism 03/16/2019   Paroxysmal atrial fibrillation ([place]) 09/05/2018   COPD (chronic obstructive pulmonary disease) ([place]) 05/07/2018   Gastroesophageal reflux disease    Pulmonary embolism ([place]) 09/18/2017   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 08/23/2017   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   Pulmonary hypertension ([place])    Chronic periodontitis 07/18/2017   Morbid obesity ([place])    Essential hypertension    Tobacco abuse    Chronic heart failure with preserved ejection fraction (HFpEF) ([place])    Chronic venous insufficiency    Past Medical History:  Diagnosis Date   Anemia    Anxiety    Aortic stenosis    Arthritis    Asthma    Back pain    Cellulitis of skin with lymphangitis    CHF (congestive heart failure) (HCC)    Chronic diastolic congestive heart failure (HCC)    Chronic venous insufficiency    Constipation    COPD (chronic obstructive pulmonary disease) ([place])    Coronary artery disease with history of myocardial infarction without history of CABG    Depression    Diabetes ([place])    Dyspnea    Essential hypertension    Gout    Heart murmur    Hypertension    Morbid obesity ([place])    Obesity    Pneumonia    walking  pneumonia   Prostatitis    Pulmonary embolism ([place])    Pulmonary hypertension ([place])    Pyelonephritis    S/P aortic valve replacement with bioprosthetic valve 08/23/2017   25 mm Edwards Inspiris Resilia stented bovine pericardial tissue valve   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   24 mm Hemashield supracoronary straight graft    Sleep apnea    cpap  Thoracic ascending aortic aneurysm [facility])    Thoracic ascending aortic aneurysm (HCC)    Tobacco abuse     Family History  Problem Relation Age of Onset   Hypertension Mother    Alzheimer's disease Mother    Heart attack Mother    Heart attack Brother    Diabetes Brother     Past Surgical History:  Procedure Laterality Date   AORTIC VALVE REPLACEMENT N/A 08/23/2017   Procedure: AORTIC VALVE REPLACEMENT (AVR) USING INSPIRIS RESILIA AORTIC VALVE SIZE 25 MM;  Surgeon: Rexene Alberts, MD;  Location: [place];  Service: Open Heart Surgery;  Laterality: N/A;   CARDIAC VALVE REPLACEMENT N/A    Phreesia 02/07/2020   COLONOSCOPY WITH PROPOFOL N/A 11/14/2020   Procedure: COLONOSCOPY WITH PROPOFOL;  Surgeon: Gatha Mayer, MD;  Location: [facility] ENDOSCOPY;  Service: Endoscopy;  Laterality: N/A;   ESOPHAGOGASTRODUODENOSCOPY (EGD) WITH PROPOFOL N/A 11/14/2020   Procedure: ESOPHAGOGASTRODUODENOSCOPY (EGD) WITH PROPOFOL;  Surgeon: Gatha Mayer, MD;  Location: [place];  Service: Endoscopy;  Laterality: N/A;   GIVENS CAPSULE STUDY N/A 06/30/2021   Procedure: GIVENS CAPSULE STUDY;  Surgeon: Harvel Quale, MD;  [facility];  Service: Gastroenterology;  Laterality: N/A;  7:30   MULTIPLE EXTRACTIONS WITH ALVEOLOPLASTY N/A 07/23/2017   Procedure: Extraction of tooth #10 with alveoloplasty and gross debridement of remaining teeth;  Surgeon: Lenn Cal, DDS;  Location: [place];  Service: Oral Surgery;  Laterality: N/A;   NASAL SEPTOPLASTY W/ TURBINOPLASTY Bilateral 07/01/2021   Procedure: NASAL SEPTOPLASTY WITH  TURBINATE REDUCTION;  Surgeon: Leta Baptist, MD;  Location: [place];  Service: ENT;  Laterality: Bilateral;   PACEMAKER IMPLANT N/A 08/28/2017    St Jude Medical Assurity MRI conditional  dual-chamber pacemaker for symptomatic complete heart blockby Dr Rayann Heman   TEE WITHOUT CARDIOVERSION N/A 07/16/2017   Procedure: TRANSESOPHAGEAL ECHOCARDIOGRAM (TEE);  Surgeon: Lelon Perla, MD;  Location: [facility] ENDOSCOPY;  Service: Cardiovascular;  Laterality: N/A;   TEE WITHOUT CARDIOVERSION N/A 08/23/2017   Procedure: TRANSESOPHAGEAL ECHOCARDIOGRAM (TEE);  Surgeon: Rexene Alberts, MD;  Location: [place];  Service: Open Heart Surgery;  Laterality: N/A;   THORACIC AORTIC ANEURYSM REPAIR N/A 08/23/2017   Procedure: THORACIC ASCENDING ANEURYSM REPAIR (AAA) USING HEMASHIELD GOLD KNITTED MICROVEL DOUBLE VELOUR VASCULAR GRAFT D: 24 MM  L: 30 CM;  Surgeon: Rexene Alberts, MD;  Location: [place];  Service: Open Heart Surgery;  Laterality: N/A;   Social History   Occupational History   Not on file  Tobacco Use   Smoking status: Former    Packs/day: 2.00    Years: 16.00    Total pack years: 32.00    Types: Cigarettes    Quit date: 08/23/2017    Years since quitting: 4.2   Smokeless tobacco: Never  Vaping Use   Vaping Use: Never used  Substance and Sexual Activity   Alcohol use: No    Comment: have had alcohol in the past, not heavy   Drug use: No   Sexual activity: Not Currently    Partners: Female

## 2021-11-08 ENCOUNTER — Encounter: Payer: Self-pay | Admitting: Orthopedic Surgery

## 2021-11-08 NOTE — Progress Notes (Signed)
Office Visit Note   Patient: Reginald [name]           Date of Birth: [birth year]           MRN: [id number] Visit Date: 10/31/2021              Requested by: [name], MD [address] [place],  [place] [address] PCP: [name], MD  Chief Complaint  Patient presents with   Right Leg - Follow-up   Left Leg - Follow-up      HPI: Patient is a 51 year old gentleman with bilateral lower extremity venous ulcers.  Patient states he took the compression wraps off the day after his appointment stating that they rolled down.  Assessment & Plan: Visit Diagnoses:  1. Chronic venous hypertension (idiopathic) with ulcer and inflammation of bilateral lower extremity (HCC)     Plan: We will apply the paste and a Dynaflex wrap.  Follow-Up Instructions: Return in about 1 week (around 11/07/2021).   Ortho Exam  Patient is alert, oriented, no adenopathy, well-dressed, normal affect, normal respiratory effort. Examination patient has significant venous stasis swelling and ulceration left worse than right.  The left calf is 67 cm in circumference that was 68.  The right calf is 64 cm in circumference it was 65.  Imaging: No results found. No images are attached to the encounter.  Labs: Lab Results  Component Value Date   HGBA1C 5.7 (H) 06/28/2021   HGBA1C 6.4 (H) 03/24/2021   HGBA1C 5.4 11/12/2020   REPTSTATUS 07/25/2021 FINAL 07/19/2021   REPTSTATUS 07/25/2021 FINAL 07/19/2021   GRAMSTAIN  09/19/2017    NO WBC SEEN NO ORGANISMS SEEN Performed at [facility], [place] [address] [place], [place] [address]    CULT  07/19/2021    NO GROWTH 6 DAYS Performed at [facility], [address], [place], [place] [address]    CULT  07/19/2021    NO GROWTH 6 DAYS Performed at [facility], [address] [place], [place] [address]    LABORGA ESCHERICHIA COLI (A) 03/07/2019     Lab Results  Component Value Date   ALBUMIN 4.0 09/14/2021   ALBUMIN 4.0 07/26/2021   ALBUMIN  3.7 07/19/2021    Lab Results  Component Value Date   MG 2.0 03/25/2021   MG 2.2 11/15/2020   MG 2.2 11/14/2020   Lab Results  Component Value Date   VD25OH 24.8 (L) 05/13/2021    No results found for: "PREALBUMIN"    Latest Ref Rng & Units 10/10/2021    7:58 AM 09/14/2021   12:40 PM 09/06/2021   11:29 AM  CBC EXTENDED  WBC 4.0 - 10.5 K/uL 9.6  9.4  9.6   RBC 4.22 - 5.81 MIL/uL 4.44  4.58    4.55  4.41   Hemoglobin 13.0 - 17.0 g/dL 11.1  11.9  11.6   HCT 39.0 - 52.0 % 37.8  37.7  36.8   Platelets 150 - 400 K/uL 226  276  250   NEUT# 1.7 - 7.7 K/uL 6.8  6.5    Lymph# 0.7 - 4.0 K/uL 1.8  2.1       There is no height or weight on file to calculate BMI.  Orders:  No orders of the defined types were placed in this encounter.  No orders of the defined types were placed in this encounter.    Procedures: No procedures performed  Clinical Data: No additional findings.  ROS:  All  other systems negative, except as noted in the HPI. Review of Systems  Objective: Vital Signs: There were no vitals taken for this visit.  Specialty Comments:  No specialty comments available.  PMFS History: Patient Active Problem List   Diagnosis Date Noted   Depression, major, single episode, severe ([place]) 10/16/2021   Leg ulcer, left ([place]) 10/11/2021   Symptomatic anemia    Acute renal failure superimposed on stage 2 chronic kidney disease ([place]) 07/19/2021   Chest pain 07/19/2021   Hypotension due to hypovolemia 07/19/2021   S/P nasal septoplasty 07/01/2021   Wound cellulitis 06/09/2021   SOB (shortness of breath) 06/09/2021   Pain due to onychomycosis of toenails of both feet 05/02/2021   Diabetic neuropathy ([place]) 05/02/2021   Callus of foot 05/02/2021   BPH with obstruction/lower urinary tract symptoms 04/05/2021   Acute on chronic diastolic CHF (congestive heart failure) ([place]) 03/25/2021   Morbid obesity with BMI of 60.0-69.9, adult ([place]) 03/25/2021   Mixed hyperlipidemia  03/24/2021   Atrial fibrillation, chronic ([place]) 03/24/2021   Prolonged QT interval 03/24/2021   Iron deficiency anemia 02/21/2021   Constipation 02/21/2021   Chronic blood loss anemia 11/12/2020   Gastrointestinal hemorrhage 10/08/2020   S/P placement of cardiac pacemaker 02/10/2020   Encounter for examination following treatment at hospital 02/10/2020   Anxiety 02/10/2020   DM2 (diabetes mellitus, type 2) ([place]) 02/10/2020   OSA on CPAP    History of pulmonary embolism 03/16/2019   Paroxysmal atrial fibrillation ([place]) 09/05/2018   COPD (chronic obstructive pulmonary disease) ([place]) 05/07/2018   Gastroesophageal reflux disease    Pulmonary embolism ([place]) 09/18/2017   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 08/23/2017   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   Pulmonary hypertension ([place])    Chronic periodontitis 07/18/2017   Morbid obesity ([place])    Essential hypertension    Tobacco abuse    Chronic heart failure with preserved ejection fraction (HFpEF) ([place])    Chronic venous insufficiency    Past Medical History:  Diagnosis Date   Anemia    Anxiety    Aortic stenosis    Arthritis    Asthma    Back pain    Cellulitis of skin with lymphangitis    CHF (congestive heart failure) (HCC)    Chronic diastolic congestive heart failure (HCC)    Chronic venous insufficiency    Constipation    COPD (chronic obstructive pulmonary disease) ([place])    Coronary artery disease with history of myocardial infarction without history of CABG    Depression    Diabetes ([place])    Dyspnea    Essential hypertension    Gout    Heart murmur    Hypertension    Morbid obesity ([place])    Obesity    Pneumonia    walking pneumonia   Prostatitis    Pulmonary embolism ([place])    Pulmonary hypertension ([place])    Pyelonephritis    S/P aortic valve replacement with bioprosthetic valve 08/23/2017   25 mm Edwards Inspiris Resilia stented bovine  pericardial tissue valve   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   24 mm Hemashield supracoronary straight graft    Sleep apnea    cpap    Thoracic ascending aortic aneurysm [facility])    Thoracic ascending aortic aneurysm (HCC)    Tobacco abuse     Family History  Problem Relation Age of Onset   Hypertension Mother    Alzheimer's disease Mother  Heart attack Mother    Heart attack Brother    Diabetes Brother     Past Surgical History:  Procedure Laterality Date   AORTIC VALVE REPLACEMENT N/A 08/23/2017   Procedure: AORTIC VALVE REPLACEMENT (AVR) USING INSPIRIS RESILIA AORTIC VALVE SIZE 25 MM;  Surgeon: Rexene Alberts, MD;  Location: [place];  Service: Open Heart Surgery;  Laterality: N/A;   CARDIAC VALVE REPLACEMENT N/A    Phreesia 02/07/2020   COLONOSCOPY WITH PROPOFOL N/A 11/14/2020   Procedure: COLONOSCOPY WITH PROPOFOL;  Surgeon: Gatha Mayer, MD;  Location: [facility] ENDOSCOPY;  Service: Endoscopy;  Laterality: N/A;   ESOPHAGOGASTRODUODENOSCOPY (EGD) WITH PROPOFOL N/A 11/14/2020   Procedure: ESOPHAGOGASTRODUODENOSCOPY (EGD) WITH PROPOFOL;  Surgeon: Gatha Mayer, MD;  Location: [place];  Service: Endoscopy;  Laterality: N/A;   GIVENS CAPSULE STUDY N/A 06/30/2021   Procedure: GIVENS CAPSULE STUDY;  Surgeon: Harvel Quale, MD;  [facility];  Service: Gastroenterology;  Laterality: N/A;  7:30   MULTIPLE EXTRACTIONS WITH ALVEOLOPLASTY N/A 07/23/2017   Procedure: Extraction of tooth #10 with alveoloplasty and gross debridement of remaining teeth;  Surgeon: Lenn Cal, DDS;  Location: [place];  Service: Oral Surgery;  Laterality: N/A;   NASAL SEPTOPLASTY W/ TURBINOPLASTY Bilateral 07/01/2021   Procedure: NASAL SEPTOPLASTY WITH TURBINATE REDUCTION;  Surgeon: Leta Baptist, MD;  Location: [place];  Service: ENT;  Laterality: Bilateral;   PACEMAKER IMPLANT N/A 08/28/2017    St Jude Medical Assurity MRI conditional  dual-chamber pacemaker for symptomatic complete  heart blockby Dr Rayann Heman   TEE WITHOUT CARDIOVERSION N/A 07/16/2017   Procedure: TRANSESOPHAGEAL ECHOCARDIOGRAM (TEE);  Surgeon: Lelon Perla, MD;  [facility];  Service: Cardiovascular;  Laterality: N/A;   TEE WITHOUT CARDIOVERSION N/A 08/23/2017   Procedure: TRANSESOPHAGEAL ECHOCARDIOGRAM (TEE);  Surgeon: Rexene Alberts, MD;  Location: [place];  Service: Open Heart Surgery;  Laterality: N/A;   THORACIC AORTIC ANEURYSM REPAIR N/A 08/23/2017   Procedure: THORACIC ASCENDING ANEURYSM REPAIR (AAA) USING HEMASHIELD GOLD KNITTED MICROVEL DOUBLE VELOUR VASCULAR GRAFT D: 24 MM  L: 30 CM;  Surgeon: Rexene Alberts, MD;  Location: [place];  Service: Open Heart Surgery;  Laterality: N/A;   Social History   Occupational History   Not on file  Tobacco Use   Smoking status: Former    Packs/day: 2.00    Years: 16.00    Total pack years: 32.00    Types: Cigarettes    Quit date: 08/23/2017    Years since quitting: 4.2   Smokeless tobacco: Never  Vaping Use   Vaping Use: Never used  Substance and Sexual Activity   Alcohol use: No    Comment: have had alcohol in the past, not heavy   Drug use: No   Sexual activity: Not Currently    Partners: Female

## 2021-11-10 ENCOUNTER — Ambulatory Visit (INDEPENDENT_AMBULATORY_CARE_PROVIDER_SITE_OTHER): Payer: Medicare HMO | Admitting: Urology

## 2021-11-10 ENCOUNTER — Encounter: Payer: Self-pay | Admitting: Urology

## 2021-11-10 VITALS — BP 120/67 | HR 75

## 2021-11-10 DIAGNOSIS — N401 Enlarged prostate with lower urinary tract symptoms: Secondary | ICD-10-CM

## 2021-11-10 DIAGNOSIS — R8290 Unspecified abnormal findings in urine: Secondary | ICD-10-CM

## 2021-11-10 DIAGNOSIS — R310 Gross hematuria: Secondary | ICD-10-CM | POA: Diagnosis not present

## 2021-11-10 DIAGNOSIS — N138 Other obstructive and reflux uropathy: Secondary | ICD-10-CM

## 2021-11-10 LAB — URINALYSIS, ROUTINE W REFLEX MICROSCOPIC
Bilirubin, UA: NEGATIVE
Glucose, UA: NEGATIVE
Ketones, UA: NEGATIVE
Nitrite, UA: POSITIVE — AB
Specific Gravity, UA: 1.02 (ref 1.005–1.030)
Urobilinogen, Ur: 1 mg/dL (ref 0.2–1.0)
pH, UA: 7 (ref 5.0–7.5)

## 2021-11-10 LAB — MICROSCOPIC EXAMINATION
Renal Epithel, UA: NONE SEEN /hpf
WBC, UA: >30 /hpf — AB (ref 0–5)

## 2021-11-10 MED ORDER — CEFDINIR 300 MG PO CAPS: 300.0000 mg | ORAL_CAPSULE | Freq: Two times a day (BID) | ORAL | 0 refills | Status: AC

## 2021-11-10 NOTE — Progress Notes (Signed)
Assessment: 1. BPH with obstruction/lower urinary tract symptoms   2. Gross hematuria; negative evaluation 1/23   3. Abnormal urine findings     Plan: Urine culture sent today. Begin Omnicef 300 mg p.o. twice daily x7 days. Continue tamsulosin 0.4 mg twice daily. Return to office in 4 weeks  Chief Complaint:  Chief Complaint  Patient presents with   Benign Prostatic Hypertrophy   History of Present Illness:  Reginald [name] is a 51 y.o. year old male who is seen for further evaluation of gross hematuria and LUTS.  He reported a history of gross hematuria, dysuria, and urinary hesitancy.  His symptoms began in early November 2022.  He presented to the emergency room on 03/24/2021 and was found to be hypotensive.  Urinalysis showed >50 RBCs.  Urine culture showed no growth.  CT abdomen and pelvis without contrast from 03/24/2021 showed no renal abnormalities, no stones or hydronephrosis, normal-appearing bladder.  He was treated with antibiotics for possible UTI.  Following discharge, the gross hematuria resolved.  He continued to report some slight dysuria and hesitancy.  He also has urinary frequency and nocturia baseline.  No prior history of stones or UTIs. AUA score = 18.  CT abdomen pelvis with contrast from 11/12/2020 showed no renal mass or obstruction. PSA 11/22:  1.9  He was scheduled for cystoscopy on 05/19/21.  This was postponed due to possible UTI. He noted onset of increased frequency, urgency, and dysuria.  No gross hematuria.  No fevers, chills, or flank pain.  He continued on tamsulosin. Urine culture results not available.  He was treated with Omnicef x 7 days. He continued with some frequency, urgency, and nocturia. He continued on tamsulosin. IPSS = 15. Cystoscopy from 1/23 showed lateral lobe enlargement of the prostate but no urethral or bladder abnormalities. At his visit in 4/23, he reported no further gross hematuria.  No dysuria or flank pain.  He continued on  tamsulosin.  He reported LUTS including frequency, urgency, nocturia x5, and decreased force of stream. IPSS = 21. His tamsulosin was increased to 0.4 mg BID.  He returns today for follow-up.  He is on tamsulosin twice daily.  He has had increased urinary symptoms including frequency, urgency, nocturia, and sensation of incomplete emptying.  He is also having dysuria.  He has increased symptoms began about 2 weeks ago.  He also reports problems with urinary incontinence without awareness.  This typically occurs in between voids. IPSS = 27 today.  Portions of the above documentation were copied from a prior visit for review purposes only.   Past Medical History:  Past Medical History:  Diagnosis Date   Anemia    Anxiety    Aortic stenosis    Arthritis    Asthma    Back pain    Cellulitis of skin with lymphangitis    CHF (congestive heart failure) (HCC)    Chronic diastolic congestive heart failure (HCC)    Chronic venous insufficiency    Constipation    COPD (chronic obstructive pulmonary disease) (HCC)    Coronary artery disease with history of myocardial infarction without history of CABG    Depression    Diabetes ([place])    Dyspnea    Essential hypertension    Gout    Heart murmur    Hypertension    Morbid obesity ([place])    Obesity    Pneumonia    walking pneumonia   Prostatitis    Pulmonary embolism ([place])  Pulmonary hypertension (HCC)    Pyelonephritis    S/P aortic valve replacement with bioprosthetic valve 08/23/2017   25 mm Edwards Inspiris Resilia stented bovine pericardial tissue valve   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   24 mm Hemashield supracoronary straight graft    Sleep apnea    cpap    Thoracic ascending aortic aneurysm [facility])    Thoracic ascending aortic aneurysm (HCC)    Tobacco abuse     Past Surgical History:  Past Surgical History:  Procedure Laterality Date   AORTIC VALVE REPLACEMENT N/A 08/23/2017   Procedure: AORTIC VALVE  REPLACEMENT (AVR) USING INSPIRIS RESILIA AORTIC VALVE SIZE 25 MM;  Surgeon: Rexene Alberts, MD;  Location: [place];  Service: Open Heart Surgery;  Laterality: N/A;   CARDIAC VALVE REPLACEMENT N/A    Phreesia 02/07/2020   COLONOSCOPY WITH PROPOFOL N/A 11/14/2020   Procedure: COLONOSCOPY WITH PROPOFOL;  Surgeon: Gatha Mayer, MD;  [facility] ENDOSCOPY;  Service: Endoscopy;  Laterality: N/A;   ESOPHAGOGASTRODUODENOSCOPY (EGD) WITH PROPOFOL N/A 11/14/2020   Procedure: ESOPHAGOGASTRODUODENOSCOPY (EGD) WITH PROPOFOL;  Surgeon: Gatha Mayer, MD;  Location: [place];  Service: Endoscopy;  Laterality: N/A;   GIVENS CAPSULE STUDY N/A 06/30/2021   Procedure: GIVENS CAPSULE STUDY;  Surgeon: Harvel Quale, MD;  [facility];  Service: Gastroenterology;  Laterality: N/A;  7:30   MULTIPLE EXTRACTIONS WITH ALVEOLOPLASTY N/A 07/23/2017   Procedure: Extraction of tooth #10 with alveoloplasty and gross debridement of remaining teeth;  Surgeon: Lenn Cal, DDS;  Location: [place];  Service: Oral Surgery;  Laterality: N/A;   NASAL SEPTOPLASTY W/ TURBINOPLASTY Bilateral 07/01/2021   Procedure: NASAL SEPTOPLASTY WITH TURBINATE REDUCTION;  Surgeon: Leta Baptist, MD;  Location: [place];  Service: ENT;  Laterality: Bilateral;   PACEMAKER IMPLANT N/A 08/28/2017    St Jude Medical Assurity MRI conditional  dual-chamber pacemaker for symptomatic complete heart blockby Dr Rayann Heman   TEE WITHOUT CARDIOVERSION N/A 07/16/2017   Procedure: TRANSESOPHAGEAL ECHOCARDIOGRAM (TEE);  Surgeon: Lelon Perla, MD;  Location: [facility] ENDOSCOPY;  Service: Cardiovascular;  Laterality: N/A;   TEE WITHOUT CARDIOVERSION N/A 08/23/2017   Procedure: TRANSESOPHAGEAL ECHOCARDIOGRAM (TEE);  Surgeon: Rexene Alberts, MD;  Location: [place];  Service: Open Heart Surgery;  Laterality: N/A;   THORACIC AORTIC ANEURYSM REPAIR N/A 08/23/2017   Procedure: THORACIC ASCENDING ANEURYSM REPAIR (AAA) USING HEMASHIELD GOLD KNITTED  MICROVEL DOUBLE VELOUR VASCULAR GRAFT D: 24 MM  L: 30 CM;  Surgeon: Rexene Alberts, MD;  Location: [place];  Service: Open Heart Surgery;  Laterality: N/A;    Allergies:  No Known Allergies  Family History:  Family History  Problem Relation Age of Onset   Hypertension Mother    Alzheimer's disease Mother    Heart attack Mother    Heart attack Brother    Diabetes Brother     Social History:  Social History   Tobacco Use   Smoking status: Former    Packs/day: 2.00    Years: 16.00    Total pack years: 32.00    Types: Cigarettes    Quit date: 08/23/2017    Years since quitting: 4.2   Smokeless tobacco: Never  Vaping Use   Vaping Use: Never used  Substance Use Topics   Alcohol use: No    Comment: have had alcohol in the past, not heavy   Drug use: No   ROS: Constitutional:  Negative for fever, chills, weight loss CV: Negative for chest pain, previous MI, hypertension  Respiratory:  Negative for shortness of breath, wheezing, sleep apnea, frequent cough GI:  Negative for nausea, vomiting, bloody stool, GERD  Physical exam: BP 120/67   Pulse 75  GENERAL APPEARANCE:  Well appearing, well developed, well nourished, NAD HEENT:  Atraumatic, normocephalic, oropharynx clear NECK:  Supple without lymphadenopathy or thyromegaly ABDOMEN:  Soft, non-tender, no masses EXTREMITIES:  Moves all extremities well, without clubbing, cyanosis, or edema NEUROLOGIC:  Alert and oriented x 3, normal gait, CN II-XII grossly intact MENTAL STATUS:  appropriate BACK:  Non-tender to palpation, No CVAT SKIN:  Warm, dry, and intact   Results: U/A: >30 WBC, 11-30 RBC, many bacteria, nitrite +

## 2021-11-13 LAB — URINE CULTURE

## 2021-11-15 ENCOUNTER — Ambulatory Visit: Payer: Medicare HMO | Admitting: Orthopedic Surgery

## 2021-11-15 ENCOUNTER — Ambulatory Visit (INDEPENDENT_AMBULATORY_CARE_PROVIDER_SITE_OTHER): Payer: Medicare HMO | Admitting: Internal Medicine

## 2021-11-15 ENCOUNTER — Encounter: Payer: Self-pay | Admitting: Internal Medicine

## 2021-11-15 VITALS — BP 136/88 | HR 78 | Resp 18 | Ht 71.0 in

## 2021-11-15 DIAGNOSIS — H66003 Acute suppurative otitis media without spontaneous rupture of ear drum, bilateral: Secondary | ICD-10-CM | POA: Diagnosis not present

## 2021-11-15 DIAGNOSIS — I890 Lymphedema, not elsewhere classified: Secondary | ICD-10-CM | POA: Insufficient documentation

## 2021-11-15 DIAGNOSIS — I87333 Chronic venous hypertension (idiopathic) with ulcer and inflammation of bilateral lower extremity: Secondary | ICD-10-CM | POA: Diagnosis not present

## 2021-11-15 DIAGNOSIS — Z7901 Long term (current) use of anticoagulants: Secondary | ICD-10-CM | POA: Insufficient documentation

## 2021-11-15 MED ORDER — OFLOXACIN 0.3 % OT SOLN: 5.0000 [drp] | Freq: Every day | OTIC | 0 refills | Status: DC

## 2021-11-16 DIAGNOSIS — I87333 Chronic venous hypertension (idiopathic) with ulcer and inflammation of bilateral lower extremity: Secondary | ICD-10-CM | POA: Insufficient documentation

## 2021-11-16 DIAGNOSIS — E1142 Type 2 diabetes mellitus with diabetic polyneuropathy: Secondary | ICD-10-CM | POA: Diagnosis not present

## 2021-11-16 DIAGNOSIS — M79643 Pain in unspecified hand: Secondary | ICD-10-CM | POA: Diagnosis not present

## 2021-11-16 DIAGNOSIS — R208 Other disturbances of skin sensation: Secondary | ICD-10-CM | POA: Diagnosis not present

## 2021-11-16 DIAGNOSIS — G5603 Carpal tunnel syndrome, bilateral upper limbs: Secondary | ICD-10-CM | POA: Diagnosis not present

## 2021-11-16 MED ORDER — CIPROFLOXACIN-HYDROCORTISONE 0.2-1 % OT SUSP
3.0000 [drp] | Freq: Two times a day (BID) | OTIC | 0 refills | Status: DC
Start: 2021-11-16 — End: 2021-11-18

## 2021-11-16 NOTE — Progress Notes (Signed)
Acute Office Visit  Subjective:    Patient ID: Reginald [name], male    DOB: [birth year], 50 y.o.   MRN: [id number]  Chief Complaint  Patient presents with   Follow-up    Re eval leg he see dr Sharol Given for this wound care    HPI Patient is in today for follow-up of left leg ulcer.  He has been seeing Dr Sharol Given for wound care. His wound is healing well, has a bandage over his left leg. He has removed dressing from right leg due to skin irritation. He has compression bandage over right leg.  He c/o b/l ear fullness and bloody discharge. Denies any recent injury. He has chronic nasal congestion, but denies any fever or chills currently.  Past Medical History:  Diagnosis Date   Anemia    Anxiety    Aortic stenosis    Arthritis    Asthma    Back pain    Cellulitis of skin with lymphangitis    CHF (congestive heart failure) (HCC)    Chronic diastolic congestive heart failure (HCC)    Chronic venous insufficiency    Constipation    COPD (chronic obstructive pulmonary disease) (HCC)    Coronary artery disease with history of myocardial infarction without history of CABG    Depression    Diabetes (HCC)    Dyspnea    Essential hypertension    Gout    Heart murmur    Hypertension    Morbid obesity ([place])    Obesity    Pneumonia    walking pneumonia   Prostatitis    Pulmonary embolism ([place])    Pulmonary hypertension (HCC)    Pyelonephritis    S/P aortic valve replacement with bioprosthetic valve 08/23/2017   25 mm Edwards Inspiris Resilia stented bovine pericardial tissue valve   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   24 mm Hemashield supracoronary straight graft    Sleep apnea    cpap    Thoracic ascending aortic aneurysm ([place])    Thoracic ascending aortic aneurysm (HCC)    Tobacco abuse     Past Surgical History:  Procedure Laterality Date   AORTIC VALVE REPLACEMENT N/A 08/23/2017   Procedure: AORTIC VALVE REPLACEMENT (AVR) USING INSPIRIS RESILIA AORTIC VALVE SIZE 25  MM;  Surgeon: Rexene Alberts, MD;  Location: [place];  Service: Open Heart Surgery;  Laterality: N/A;   CARDIAC VALVE REPLACEMENT N/A    Phreesia 02/07/2020   COLONOSCOPY WITH PROPOFOL N/A 11/14/2020   Procedure: COLONOSCOPY WITH PROPOFOL;  Surgeon: Gatha Mayer, MD;  Location: [facility] ENDOSCOPY;  Service: Endoscopy;  Laterality: N/A;   ESOPHAGOGASTRODUODENOSCOPY (EGD) WITH PROPOFOL N/A 11/14/2020   Procedure: ESOPHAGOGASTRODUODENOSCOPY (EGD) WITH PROPOFOL;  Surgeon: Gatha Mayer, MD;  Location: [place];  Service: Endoscopy;  Laterality: N/A;   GIVENS CAPSULE STUDY N/A 06/30/2021   Procedure: GIVENS CAPSULE STUDY;  Surgeon: Harvel Quale, MD;  [facility];  Service: Gastroenterology;  Laterality: N/A;  7:30   MULTIPLE EXTRACTIONS WITH ALVEOLOPLASTY N/A 07/23/2017   Procedure: Extraction of tooth #10 with alveoloplasty and gross debridement of remaining teeth;  Surgeon: Lenn Cal, DDS;  Location: [place];  Service: Oral Surgery;  Laterality: N/A;   NASAL SEPTOPLASTY W/ TURBINOPLASTY Bilateral 07/01/2021   Procedure: NASAL SEPTOPLASTY WITH TURBINATE REDUCTION;  Surgeon: Leta Baptist, MD;  Location: [place];  Service: ENT;  Laterality: Bilateral;   PACEMAKER IMPLANT N/A 08/28/2017    St Jude Medical Assurity MRI conditional  dual-chamber  pacemaker for symptomatic complete heart blockby Dr Rayann Heman   TEE WITHOUT CARDIOVERSION N/A 07/16/2017   Procedure: TRANSESOPHAGEAL ECHOCARDIOGRAM (TEE);  Surgeon: Lelon Perla, MD;  Location: [facility] ENDOSCOPY;  Service: Cardiovascular;  Laterality: N/A;   TEE WITHOUT CARDIOVERSION N/A 08/23/2017   Procedure: TRANSESOPHAGEAL ECHOCARDIOGRAM (TEE);  Surgeon: Rexene Alberts, MD;  Location: [place];  Service: Open Heart Surgery;  Laterality: N/A;   THORACIC AORTIC ANEURYSM REPAIR N/A 08/23/2017   Procedure: THORACIC ASCENDING ANEURYSM REPAIR (AAA) USING HEMASHIELD GOLD KNITTED MICROVEL DOUBLE VELOUR VASCULAR GRAFT D: 24 MM  L: 30 CM;  Surgeon:  Rexene Alberts, MD;  Location: [place];  Service: Open Heart Surgery;  Laterality: N/A;    Family History  Problem Relation Age of Onset   Hypertension Mother    Alzheimer's disease Mother    Heart attack Mother    Heart attack Brother    Diabetes Brother     Social History   Socioeconomic History   Marital status: Divorced    Spouse name: Not on file   Number of children: Not on file   Years of education: Not on file   Highest education level: Not on file  Occupational History   Not on file  Tobacco Use   Smoking status: Former    Packs/day: 2.00    Years: 16.00    Total pack years: 32.00    Types: Cigarettes    Quit date: 08/23/2017    Years since quitting: 4.2   Smokeless tobacco: Never  Vaping Use   Vaping Use: Never used  Substance and Sexual Activity   Alcohol use: No    Comment: have had alcohol in the past, not heavy   Drug use: No   Sexual activity: Not Currently    Partners: Female  Other Topics Concern   Not on file  Social History Narrative   Not on file   Social Determinants of Health   Financial Resource Strain: Low Risk  (03/14/2021)   Overall Financial Resource Strain (CARDIA)    Difficulty of Paying Living Expenses: Not hard at all  Food Insecurity: No Food Insecurity (03/14/2021)   Hunger Vital Sign    Worried About Running Out of Food in the Last Year: Never true    [place] in the Last Year: Never true  Transportation Needs: No Transportation Needs (03/14/2021)   PRAPARE - Hydrologist (Medical): No    Lack of Transportation (Non-Medical): No  Physical Activity: Inactive (03/14/2021)   Exercise Vital Sign    Days of Exercise per Week: 0 days    Minutes of Exercise per Session: 0 min  Stress: No Stress Concern Present (03/14/2021)   [place]    Feeling of Stress : Only a little  Social Connections: Socially Isolated (03/14/2021)    Social Connection and Isolation Panel [NHANES]    Frequency of Communication with Friends and Family: Once a week    Frequency of Social Gatherings with Friends and Family: Never    Attends Religious Services: Never    Marine scientist or Organizations: No    Attends Music therapist: More than 4 times per year    Marital Status: Never married  Intimate Partner Violence: Not At Risk (03/14/2021)   Humiliation, Afraid, Rape, and Kick questionnaire    Fear of Current or Ex-Partner: No    Emotionally Abused: No    Physically Abused: No  Sexually Abused: No    Outpatient Medications Prior to Visit  Medication Sig Dispense Refill   acetaminophen (TYLENOL) 325 MG tablet Take 650 mg by mouth every 6 (six) hours as needed for mild pain.     albuterol (PROVENTIL) (2.5 MG/3ML) 0.083% nebulizer solution INHALE THE CONTENTS OF 1 VIAL VIA NEBULIZER EVERY 6 HOURS AS NEEDED FOR WHEEZING OR SHORTNESS OF BREATH 270 mL 1   albuterol (VENTOLIN HFA) 108 (90 Base) MCG/ACT inhaler INHALE 2 PUFFS INTO THE LUNGS EVERY 6 (SIX) HOURS AS NEEDED FOR WHEEZING OR SHORTNESS OF BREATH. 1 each 1   allopurinol (ZYLOPRIM) 300 MG tablet TAKE 1 TABLET EVERY DAY 90 tablet 3   atorvastatin (LIPITOR) 40 MG tablet TAKE 1 TABLET EVERY DAY 90 tablet 3   Blood Glucose Monitoring Suppl (TRUE METRIX METER) w/Device KIT USE AS DIRECTED 1 kit 0   buPROPion (WELLBUTRIN XL) 300 MG 24 hr tablet Take 1 tablet (300 mg total) by mouth daily. 90 tablet 3   cefdinir (OMNICEF) 300 MG capsule Take 1 capsule (300 mg total) by mouth 2 (two) times daily for 7 days. 14 capsule 0   Cholecalciferol 125 MCG (5000 UT) TABS Take 5,000 Units by mouth daily.     Colchicine 0.6 MG CAPS 1.2 mg (2 tablets) followed by 0.6 mg (1 tablet) 1 hour later. Dosing may be repeated (if needed) after 3 days. 3 capsule 0   docusate sodium (COLACE) 100 MG capsule Take 2 capsules (200 mg total) by mouth 2 (two) times daily. (Patient taking differently:  Take 200 mg by mouth daily.) 30 capsule 0   FEROSUL 325 (65 Fe) MG tablet TAKE 1 TABLET EVERY DAY 90 tablet 1   fexofenadine (ALLEGRA) 180 MG tablet Take 180 mg by mouth daily.     gabapentin (NEURONTIN) 300 MG capsule Take 1 capsule (300 mg total) by mouth 3 (three) times daily. 270 capsule 0   hydrOXYzine (ATARAX) 25 MG tablet TAKE 1 TABLET BY MOUTH EVERY 8 HOURS AS NEEDED 90 tablet 0   Krill Oil 350 MG CAPS Take 350 mg by mouth daily.     metFORMIN (GLUCOPHAGE) 850 MG tablet TAKE 1 TABLET TWICE DAILY WITH MEALS 180 tablet 2   metolazone (ZAROXOLYN) 5 MG tablet TAKE 1 TABLET (5 MG TOTAL) BY MOUTH THREE TIMES A WEEK. (Patient taking differently: Take 5 mg by mouth See admin instructions. TAKE 1 TABLET (5 MG TOTAL) BY MOUTH THREE TIMES A WEEK AS NEEDED) 36 tablet 3   metoprolol tartrate (LOPRESSOR) 100 MG tablet TAKE 1 TABLET TWICE DAILY 180 tablet 3   Misc. Devices MISC Sequential compression device - 1 pair. Apply them for 1 hour in the morning and 1 hour in the evening. 2 each 0   pantoprazole (PROTONIX) 40 MG tablet TAKE 1 TABLET EVERY DAY 90 tablet 3   potassium chloride SA (KLOR-CON) 20 MEQ tablet TAKE 3 TABS DAILY AND TAKE AN ADDITIONAL 1 TAB ON THE DAY YOU TAKE YOUR WEEKLY METOLAZONE DOSE 283 tablet 3   rivaroxaban (XARELTO) 20 MG TABS tablet Take 20 mg by mouth daily.     Semaglutide, 2 MG/DOSE, (OZEMPIC, 2 MG/DOSE,) 8 MG/3ML SOPN Inject 2 mg into the skin once a week.     tamsulosin (FLOMAX) 0.4 MG CAPS capsule Take 1 capsule (0.4 mg total) by mouth 2 (two) times daily. 60 capsule 11   Tiotropium Bromide Monohydrate (SPIRIVA RESPIMAT) 2.5 MCG/ACT AERS Inhale 2 puffs into the lungs daily. 4 g 5  torsemide (DEMADEX) 20 MG tablet Take 2 tablets (40 mg total) by mouth daily. Can take an extra tablet if needed for weight gain or edema. 180 tablet 2   TRUE METRIX BLOOD GLUCOSE TEST test strip TEST ONE TIME DAILY FOR DIABETES 100 strip 5   TRUEplus Lancets 33G MISC USE TO TEST ONE TIME DAILY  FOR DIABETES 100 each 5   TURMERIC-GINGER PO Take 2 each by mouth daily.     No facility-administered medications prior to visit.    No Known Allergies  Review of Systems  Constitutional:  Positive for fatigue. Negative for chills and fever.  HENT:  Positive for ear discharge and ear pain. Negative for congestion, sinus pressure and sinus pain.   Eyes:  Negative for pain and redness.  Respiratory:  Positive for cough and shortness of breath.   Cardiovascular:  Positive for leg swelling. Negative for chest pain and palpitations.  Gastrointestinal:  Positive for constipation. Negative for diarrhea and vomiting.  Endocrine: Negative for polydipsia and polyuria.  Genitourinary:  Negative for dysuria and hematuria.  Musculoskeletal:  Positive for arthralgias and back pain. Negative for neck pain and neck stiffness.  Skin:  Positive for wound.  Neurological:  Positive for weakness and headaches. Negative for syncope.  Psychiatric/Behavioral:  Positive for dysphoric mood and sleep disturbance. Negative for agitation and behavioral problems. The patient is nervous/anxious.        Objective:    Physical Exam Vitals reviewed.  Constitutional:      General: He is not in acute distress.    Appearance: He is obese. He is not diaphoretic.     Comments: uses cane for walking support  HENT:     Head: Normocephalic and atraumatic.     Right Ear: A middle ear effusion is present. Tympanic membrane is erythematous.     Left Ear: A middle ear effusion is present. Tympanic membrane is erythematous.     Ears:     Comments: Bloody discharge noted in external ear cannot    Nose: Nose normal.     Mouth/Throat:     Mouth: Mucous membranes are moist.  Eyes:     General: No scleral icterus.    Extraocular Movements: Extraocular movements intact.  Cardiovascular:     Rate and Rhythm: Normal rate and regular rhythm.     Pulses: Normal pulses.     Heart sounds: Murmur (Systolic over b/l upper sternal  borders) heard.  Pulmonary:     Breath sounds: No wheezing or rales.  Abdominal:     Palpations: Abdomen is soft.     Tenderness: There is no abdominal tenderness.  Musculoskeletal:     Cervical back: Neck supple. No tenderness.     Right lower leg: Edema (2+) present.     Left lower leg: Edema (2+) present.  Skin:    General: Skin is warm.     Findings: Rash (stasis dermatitis over b/l LE) present.     Comments: Dressing over right LE - C/D/I  Neurological:     General: No focal deficit present.     Mental Status: He is alert and oriented to person, place, and time.     Sensory: No sensory deficit.     Motor: Weakness (4/5 in b/l LE) present.  Psychiatric:        Mood and Affect: Mood normal.        Behavior: Behavior normal.     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    Dispense:  5 mL    Refill:  0   DISCONTD: ciprofloxacin-hydrocortisone (CIPRO HC OTIC) OTIC suspension     Sig: Place 3 drops into both ears 2 (two) times daily.    Dispense:  10 mL    Refill:  0    Alternative for Ofloxacin     [name], MD

## 2021-11-17 ENCOUNTER — Telehealth: Payer: Self-pay | Admitting: *Deleted

## 2021-11-17 NOTE — Telephone Encounter (Signed)
error 

## 2021-11-18 ENCOUNTER — Encounter: Payer: Self-pay | Admitting: *Deleted

## 2021-11-18 NOTE — Assessment & Plan Note (Signed)
Wound well-healing according to wound care visit note F/u with Dr Sharol Given

## 2021-11-21 ENCOUNTER — Encounter: Payer: Self-pay | Admitting: Orthopedic Surgery

## 2021-11-21 DIAGNOSIS — R040 Epistaxis: Secondary | ICD-10-CM | POA: Diagnosis not present

## 2021-11-21 NOTE — Progress Notes (Signed)
Office Visit Note   Patient: Reginald [name]           Date of Birth: 05-18-1971           MRN: [id number] Visit Date: 11/15/2021              Requested by: [name], MD [address] [place],  [place] [address] PCP: [name], MD  Chief Complaint  Patient presents with   Right Leg - Edema, Follow-up   Left Leg - Edema, Follow-up      HPI: Patient is a 51 year old gentleman who presents in follow-up for bilateral lower extremity venous ulcers.  Patient has been in compression wraps bilaterally.  Patient states he took off the right wrap stating it was too painful the left leg dressing was removed in the office today.  Assessment & Plan: Visit Diagnoses:  1. Chronic venous hypertension (idiopathic) with ulcer and inflammation of bilateral lower extremity (HCC)     Plan: Patient has size 7 XL compression socks recommend he resume wearing his compression socks.  Follow-Up Instructions: Return in about 4 weeks (around Jan 02, 2022).   Ortho Exam  Patient is alert, oriented, no adenopathy, well-dressed, normal affect, normal respiratory effort. Examination the venous stasis ulcers on both legs have healed his calf is 25 inches in circumference.  There is no cellulitis odor or drainage.  Imaging: No results found. No images are attached to the encounter.  Labs: Lab Results  Component Value Date   HGBA1C 5.7 (H) 06/28/2021   HGBA1C 6.4 (H) 03/24/2021   HGBA1C 5.4 11/12/2020   REPTSTATUS 07/25/2021 FINAL 07/19/2021   REPTSTATUS 07/25/2021 FINAL 07/19/2021   GRAMSTAIN  09/19/2017    NO WBC SEEN NO ORGANISMS SEEN Performed at [facility], [place] [address], [place], [place] [address]    CULT  07/19/2021    NO GROWTH 6 DAYS Performed at [facility], [address] [place], [place] [address]    CULT  07/19/2021    NO GROWTH 6 DAYS Performed at [facility], [address], [place],  [address]    LABORGA ESCHERICHIA COLI (A) 03/07/2019     Lab  Results  Component Value Date   ALBUMIN 4.0 09/14/2021   ALBUMIN 4.0 07/26/2021   ALBUMIN 3.7 07/19/2021    Lab Results  Component Value Date   MG 2.0 03/25/2021   MG 2.2 11/15/2020   MG 2.2 11/14/2020   Lab Results  Component Value Date   VD25OH 24.8 (L) 05/13/2021    No results found for: "PREALBUMIN"    Latest Ref Rng & Units 10/10/2021    7:58 AM 09/14/2021   12:40 PM 09/06/2021   11:29 AM  CBC EXTENDED  WBC 4.0 - 10.5 K/uL 9.6  9.4  9.6   RBC 4.22 - 5.81 MIL/uL 4.44  4.58    4.55  4.41   Hemoglobin 13.0 - 17.0 g/dL 11.1  11.9  11.6   HCT 39.0 - 52.0 % 37.8  37.7  36.8   Platelets 150 - 400 K/uL 226  276  250   NEUT# 1.7 - 7.7 K/uL 6.8  6.5    Lymph# 0.7 - 4.0 K/uL 1.8  2.1       There is no height or weight on file to calculate BMI.  Orders:  No orders of the defined types were placed in this encounter.  No orders of the defined types were placed in this encounter.    Procedures: No  procedures performed  Clinical Data: No additional findings.  ROS:  All other systems negative, except as noted in the HPI. Review of Systems  Objective: Vital Signs: There were no vitals taken for this visit.  Specialty Comments:  No specialty comments available.  PMFS History: Patient Active Problem List   Diagnosis Date Noted   Chronic venous hypertension (idiopathic) with ulcer and inflammation of bilateral lower extremity ([place]) 11/16/2021   Chronic acquired lymphedema 11/15/2021   Current use of long term anticoagulation 11/15/2021   Depression, major, single episode, severe ([place]) 10/16/2021   Symptomatic anemia    Acute renal failure superimposed on stage 2 chronic kidney disease ([place]) 07/19/2021   Hypotension due to hypovolemia 07/19/2021   Pain due to onychomycosis of toenails of both feet 05/02/2021   Diabetic neuropathy ([place]) 05/02/2021   Callus of foot 05/02/2021   BPH with obstruction/lower urinary tract symptoms 04/05/2021   Morbid obesity with BMI  of 60.0-69.9, adult ([place]) 03/25/2021   Mixed hyperlipidemia 03/24/2021   Atrial fibrillation, chronic (HCC) 03/24/2021   Prolonged QT interval 03/24/2021   Iron deficiency anemia 02/21/2021   Constipation 02/21/2021   Chronic blood loss anemia 11/12/2020   Gastrointestinal hemorrhage 10/08/2020   S/P placement of cardiac pacemaker 02/10/2020   Encounter for examination following treatment at hospital 02/10/2020   Anxiety 02/10/2020   DM2 (diabetes mellitus, type 2) ([place]) 02/10/2020   OSA on CPAP    History of pulmonary embolism 03/16/2019   Paroxysmal atrial fibrillation ([place]) 09/05/2018   COPD (chronic obstructive pulmonary disease) ([place]) 05/07/2018   Gastroesophageal reflux disease    Pulmonary embolism ([place]) 09/18/2017   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 08/23/2017   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   Pulmonary hypertension ([place])    Chronic periodontitis 07/18/2017   Morbid obesity ([place])    Essential hypertension    Tobacco abuse    Chronic heart failure with preserved ejection fraction (HFpEF) ([place])    Chronic venous insufficiency    Past Medical History:  Diagnosis Date   Anemia    Anxiety    Aortic stenosis    Arthritis    Asthma    Back pain    Cellulitis of skin with lymphangitis    CHF (congestive heart failure) (HCC)    Chronic diastolic congestive heart failure (HCC)    Chronic venous insufficiency    Constipation    COPD (chronic obstructive pulmonary disease) ([place])    Coronary artery disease with history of myocardial infarction without history of CABG    Depression    Diabetes ([place])    Dyspnea    Essential hypertension    Gout    Heart murmur    Hypertension    Morbid obesity ([place])    Obesity    Pneumonia    walking pneumonia   Prostatitis    Pulmonary embolism ([place])    Pulmonary hypertension ([place])    Pyelonephritis    S/P aortic valve replacement with bioprosthetic valve  08/23/2017   25 mm Edwards Inspiris Resilia stented bovine pericardial tissue valve   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   24 mm Hemashield supracoronary straight graft    Sleep apnea    cpap    Thoracic ascending aortic aneurysm [facility])    Thoracic ascending aortic aneurysm (HCC)    Tobacco abuse     Family History  Problem Relation Age of Onset   Hypertension Mother    Alzheimer's disease Mother  Heart attack Mother    Heart attack Brother    Diabetes Brother     Past Surgical History:  Procedure Laterality Date   AORTIC VALVE REPLACEMENT N/A 08/23/2017   Procedure: AORTIC VALVE REPLACEMENT (AVR) USING INSPIRIS RESILIA AORTIC VALVE SIZE 25 MM;  Surgeon: Rexene Alberts, MD;  Location: [facility];  Service: Open Heart Surgery;  Laterality: N/A;   CARDIAC VALVE REPLACEMENT N/A    Phreesia 02/07/2020   COLONOSCOPY WITH PROPOFOL N/A 11/14/2020   Procedure: COLONOSCOPY WITH PROPOFOL;  Surgeon: Gatha Mayer, MD;  Location: [facility] ENDOSCOPY;  Service: Endoscopy;  Laterality: N/A;   ESOPHAGOGASTRODUODENOSCOPY (EGD) WITH PROPOFOL N/A 11/14/2020   Procedure: ESOPHAGOGASTRODUODENOSCOPY (EGD) WITH PROPOFOL;  Surgeon: Gatha Mayer, MD;  Location: [facility];  Service: Endoscopy;  Laterality: N/A;   GIVENS CAPSULE STUDY N/A 06/30/2021   Procedure: GIVENS CAPSULE STUDY;  Surgeon: Harvel Quale, MD;  [facility];  Service: Gastroenterology;  Laterality: N/A;  7:30   MULTIPLE EXTRACTIONS WITH ALVEOLOPLASTY N/A 07/23/2017   Procedure: Extraction of tooth #10 with alveoloplasty and gross debridement of remaining teeth;  Surgeon: Lenn Cal, DDS;  Location: [facility];  Service: Oral Surgery;  Laterality: N/A;   NASAL SEPTOPLASTY W/ TURBINOPLASTY Bilateral 07/01/2021   Procedure: NASAL SEPTOPLASTY WITH TURBINATE REDUCTION;  Surgeon: Leta Baptist, MD;  Location: [facility];  Service: ENT;  Laterality: Bilateral;   PACEMAKER IMPLANT N/A 08/28/2017    St Jude Medical Assurity MRI  conditional  dual-chamber pacemaker for symptomatic complete heart blockby Dr Rayann Heman   TEE WITHOUT CARDIOVERSION N/A 07/16/2017   Procedure: TRANSESOPHAGEAL ECHOCARDIOGRAM (TEE);  Surgeon: Lelon Perla, MD;  Location[facility];  Service: Cardiovascular;  Laterality: N/A;   TEE WITHOUT CARDIOVERSION N/A 08/23/2017   Procedure: TRANSESOPHAGEAL ECHOCARDIOGRAM (TEE);  Surgeon: Rexene Alberts, MD;  Location: [facility];  Service: Open Heart Surgery;  Laterality: N/A;   THORACIC AORTIC ANEURYSM REPAIR N/A 08/23/2017   Procedure: THORACIC ASCENDING ANEURYSM REPAIR (AAA) USING HEMASHIELD GOLD KNITTED MICROVEL DOUBLE VELOUR VASCULAR GRAFT D: 24 MM  L: 30 CM;  Surgeon: Rexene Alberts, MD;  Location: [facility];  Service: Open Heart Surgery;  Laterality: N/A;   Social History   Occupational History   Not on file  Tobacco Use   Smoking status: Former    Packs/day: 2.00    Years: 16.00    Total pack years: 32.00    Types: Cigarettes    Quit date: 08/23/2017    Years since quitting: 4.2   Smokeless tobacco: Never  Vaping Use   Vaping Use: Never used  Substance and Sexual Activity   Alcohol use: No    Comment: have had alcohol in the past, not heavy   Drug use: No   Sexual activity: Not Currently    Partners: Female

## 2021-11-23 ENCOUNTER — Ambulatory Visit (INDEPENDENT_AMBULATORY_CARE_PROVIDER_SITE_OTHER): Payer: Medicare HMO

## 2021-11-23 DIAGNOSIS — I480 Paroxysmal atrial fibrillation: Secondary | ICD-10-CM

## 2021-11-26 ENCOUNTER — Other Ambulatory Visit: Payer: Self-pay | Admitting: Internal Medicine

## 2021-11-29 ENCOUNTER — Ambulatory Visit (INDEPENDENT_AMBULATORY_CARE_PROVIDER_SITE_OTHER): Payer: Medicare HMO | Admitting: *Deleted

## 2021-11-29 DIAGNOSIS — J449 Chronic obstructive pulmonary disease, unspecified: Secondary | ICD-10-CM | POA: Diagnosis not present

## 2021-11-29 DIAGNOSIS — I5032 Chronic diastolic (congestive) heart failure: Secondary | ICD-10-CM | POA: Diagnosis not present

## 2021-11-29 NOTE — Patient Instructions (Signed)
Visit Information  Thank you for taking time to visit with me today. Please don't hesitate to contact me if I can be of assistance to you before our next scheduled telephone appointment.  Following are the goals we discussed today:  Take medications as prescribed   Attend all scheduled provider appointments Attend church or other social activities Perform all self care activities independently  Perform IADL's (shopping, preparing meals, housekeeping, managing finances) independently Call provider office for new concerns or questions  keep legs up while sitting track weight in diary use salt in moderation watch for swelling in feet, ankles and legs every day weigh myself daily bring diary to all appointments follow rescue plan if symptoms flare-up eat more whole grains, fruits and vegetables, lean meats and healthy fats check blood sugar at prescribed times: once daily check feet daily for cuts, sores or redness take the blood sugar log to all doctor visits take the blood sugar meter to all doctor visits trim toenails straight across fill half of plate with vegetables limit fast food meals to no more than 1 per week prepare main meal at home 3 to 5 days each week read food labels for fat, fiber, carbohydrates and portion size Heart Failure- - Important- call cardiologist office if I gain more than 3 pounds in one day or 5 pounds in one week- follow heart failure action plan, know how you are feeling each day with emphasis on yellow zone - elevate legs when you can - continue to weigh daily and write weight in diary- weigh same time each day on flat, hard surface- this is very important - follow low salt diet- read labels, avoid salty snacks and gatorade - watch for swelling in feet, ankles and legs every day - take all medications as prescribed - check blood pressure daily and keep log - keep in touch with your doctor about weight gain, swelling COPD- - Continue to follow COPD  rescue plan, be mindful of how you feel each day with emphasis on yellow zone - continue follow rescue plan if symptoms flare-up, call doctor early on if not feeling well - keep follow-up appointments  - take all medications and use inhalers as prescribed, talk to pharmacist assigned to your case if any questions or concerns - practice energy conservation, alternate activity with rest  - continue checking blood sugar daily and recording in log - case closure today, talk with your doctor if you feel you have any needs for care management in the future   Please call the care guide team at [phone] if you need to cancel or reschedule your appointment.   If you are experiencing a Mental Health or [place] or need someone to talk to, please call the Suicide and Crisis Lifeline: 988 call the [place] National Suicide Prevention Lifeline: (815)177-2970 or TTY: 602 510 5925 TTY (779)207-2598) to talk to a trained counselor call 1-800-273-TALK (toll free, 24 hour hotline) go to [facility] [address] [place] [phone]) call the [place]: [phone] call 911   Patient verbalizes understanding of instructions and care plan provided today and agrees to view in [place]. Active MyChart status and patient understanding of how to access instructions and care plan via MyChart confirmed with patient.     Jacqlyn Larsen [facility], BSN RN Case Manager [facility] [phone]

## 2021-11-29 NOTE — Chronic Care Management (AMB) (Signed)
Chronic Care Management   CCM RN Visit Note  11/29/2021 Name: Reginald [name] MRN: [id number] DOB: 09-13-1970  Subjective: Reginald [name] is a 51 y.o. year old male who is a primary care patient of [name], MD. The care management team was consulted for assistance with disease management and care coordination needs.    Engaged with patient by telephone for follow up visit in response to provider referral for case management and/or care coordination services.   Consent to Services:  The patient was given information about Chronic Care Management services, agreed to services, and gave verbal consent prior to initiation of services.  Please see initial visit note for detailed documentation.   Patient agreed to services and verbal consent obtained.   Assessment: Review of patient past medical history, allergies, medications, health status, including review of consultants reports, laboratory and other test data, was performed as part of comprehensive evaluation and provision of chronic care management services.   SDOH (Social Determinants of Health) assessments and interventions performed:    CCM Care Plan  No Known Allergies  Outpatient Encounter Medications as of 11/29/2021  Medication Sig Note   acetaminophen (TYLENOL) 325 MG tablet Take 650 mg by mouth every 6 (six) hours as needed for mild pain.    albuterol (PROVENTIL) (2.5 MG/3ML) 0.083% nebulizer solution INHALE THE CONTENTS OF 1 VIAL VIA NEBULIZER EVERY 6 HOURS AS NEEDED FOR WHEEZING OR SHORTNESS OF BREATH    albuterol (VENTOLIN HFA) 108 (90 Base) MCG/ACT inhaler INHALE 2 PUFFS INTO THE LUNGS EVERY 6 (SIX) HOURS AS NEEDED FOR WHEEZING OR SHORTNESS OF BREATH.    allopurinol (ZYLOPRIM) 300 MG tablet TAKE 1 TABLET EVERY DAY    atorvastatin (LIPITOR) 40 MG tablet TAKE 1 TABLET EVERY DAY    Blood Glucose Monitoring Suppl (TRUE METRIX METER) w/Device KIT USE AS DIRECTED    buPROPion (WELLBUTRIN XL) 300 MG 24 hr tablet Take 1 tablet  (300 mg total) by mouth daily.    Cholecalciferol 125 MCG (5000 UT) TABS Take 5,000 Units by mouth daily.    Colchicine 0.6 MG CAPS 1.2 mg (2 tablets) followed by 0.6 mg (1 tablet) 1 hour later. Dosing may be repeated (if needed) after 3 days.    docusate sodium (COLACE) 100 MG capsule Take 2 capsules (200 mg total) by mouth 2 (two) times daily. (Patient taking differently: Take 200 mg by mouth daily.)    FEROSUL 325 (65 Fe) MG tablet TAKE 1 TABLET EVERY DAY    fexofenadine (ALLEGRA) 180 MG tablet Take 180 mg by mouth daily.    gabapentin (NEURONTIN) 300 MG capsule Take 1 capsule (300 mg total) by mouth 3 (three) times daily.    hydrOXYzine (ATARAX) 25 MG tablet TAKE 1 TABLET BY MOUTH EVERY 8 HOURS AS NEEDED    Krill Oil 350 MG CAPS Take 350 mg by mouth daily.    metFORMIN (GLUCOPHAGE) 850 MG tablet TAKE 1 TABLET TWICE DAILY WITH MEALS    metolazone (ZAROXOLYN) 5 MG tablet TAKE 1 TABLET (5 MG TOTAL) BY MOUTH THREE TIMES A WEEK. (Patient taking differently: Take 5 mg by mouth See admin instructions. TAKE 1 TABLET (5 MG TOTAL) BY MOUTH THREE TIMES A WEEK AS NEEDED) 04/19/2021: Using as needed of cardiology    metoprolol tartrate (LOPRESSOR) 100 MG tablet TAKE 1 TABLET TWICE DAILY    Misc. Devices MISC Sequential compression device - 1 pair. Apply them for 1 hour in the morning and 1 hour in the evening.  ofloxacin (FLOXIN OTIC) 0.3 % OTIC solution Place 5 drops into both ears daily.    pantoprazole (PROTONIX) 40 MG tablet TAKE 1 TABLET EVERY DAY    potassium chloride SA (KLOR-CON) 20 MEQ tablet TAKE 3 TABS DAILY AND TAKE AN ADDITIONAL 1 TAB ON THE DAY YOU TAKE YOUR WEEKLY METOLAZONE DOSE    rivaroxaban (XARELTO) 20 MG TABS tablet Take 20 mg by mouth daily.    Semaglutide, 2 MG/DOSE, (OZEMPIC, 2 MG/DOSE,) 8 MG/3ML SOPN Inject 2 mg into the skin once a week. 08/17/2021: Patient receives from [employer] patient assistance program. To start late April 2023.   tamsulosin (FLOMAX) 0.4 MG CAPS capsule  Take 1 capsule (0.4 mg total) by mouth 2 (two) times daily.    Tiotropium Bromide Monohydrate (SPIRIVA RESPIMAT) 2.5 MCG/ACT AERS Inhale 2 puffs into the lungs daily. 03/03/2021: Gets from patient assistance ([employer])   torsemide (DEMADEX) 20 MG tablet Take 2 tablets (40 mg total) by mouth daily. Can take an extra tablet if needed for weight gain or edema.    TRUE METRIX BLOOD GLUCOSE TEST test strip TEST ONE TIME DAILY FOR DIABETES    TRUEplus Lancets 33G MISC USE TO TEST ONE TIME DAILY FOR DIABETES    TURMERIC-GINGER PO Take 2 each by mouth daily.    No facility-administered encounter medications on file as of 11/29/2021.    Patient Active Problem List   Diagnosis Date Noted   Chronic venous hypertension (idiopathic) with ulcer and inflammation of bilateral lower extremity ([place]) 11/16/2021   Chronic acquired lymphedema 11/15/2021   Current use of long term anticoagulation 11/15/2021   Depression, major, single episode, severe ([place]) 10/16/2021   Symptomatic anemia    Acute renal failure superimposed on stage 2 chronic kidney disease ([place]) 07/19/2021   Hypotension due to hypovolemia 07/19/2021   Pain due to onychomycosis of toenails of both feet 05/02/2021   Diabetic neuropathy ([place]) 05/02/2021   Callus of foot 05/02/2021   BPH with obstruction/lower urinary tract symptoms 04/05/2021   Morbid obesity with BMI of 60.0-69.9, adult ([place]) 03/25/2021   Mixed hyperlipidemia 03/24/2021   Atrial fibrillation, chronic ([place]) 03/24/2021   Prolonged QT interval 03/24/2021   Iron deficiency anemia 02/21/2021   Constipation 02/21/2021   Chronic blood loss anemia 11/12/2020   Gastrointestinal hemorrhage 10/08/2020   S/P placement of cardiac pacemaker 02/10/2020   Encounter for examination following treatment at hospital 02/10/2020   Anxiety 02/10/2020   DM2 (diabetes mellitus, type 2) ([place]) 02/10/2020   OSA on CPAP    History of pulmonary embolism 03/16/2019   Paroxysmal atrial fibrillation  ([place]) 09/05/2018   COPD (chronic obstructive pulmonary disease) ([place]) 05/07/2018   Gastroesophageal reflux disease    Pulmonary embolism ([place]) 09/18/2017   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 08/23/2017   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   Pulmonary hypertension ([place])    Chronic periodontitis 07/18/2017   Morbid obesity ([place])    Essential hypertension    Tobacco abuse    Chronic heart failure with preserved ejection fraction (HFpEF) ([place])    Chronic venous insufficiency     Conditions to be addressed/monitored:CHF and COPD  Care Plan : RN Care Manager plan of care  Updates made by Kassie Mends, RN since 11/29/2021 12:00 AM     Problem: No plan of care established for management of chronic disease states (CHF, COPD)   Priority: High     Long-Range Goal: Development of plan of care  for chronic disease management (CHF, COPD)   Start Date: 04/05/2021  Expected End Date: 11/06/2021  Priority: High  Note:   Current Barriers:  Knowledge Deficits related to plan of care for management of CHF and COPD  Knowledge deficit related to basic heart failure pathophysiology and self care management- needs reinforcement for HF action plan, symptom management Patient hospitalized 2/28 for AKI, pt states he had chest pain and low blood pressure and kidneys were affected. Patient reports recent Hgb 9.9 on 07/29/21 and scheduled to start venofer iron infusions on 08/05/21 and currently finished with treatments until hemoglobin/ blood work completed. Does not adhere to provider recommendations re: low sodium diet, sometimes eats fast food, does not exercise, feels he is not able, stays tired, pt reports he checks CBG once daily with most recent AIC 5.8, CBG ranges 120-130 fasting and random ranges in low 100's. Checks blood pressure daily, denies any GI bleeding, melena or constipation today. Patient has not weighed recently due to  "a lot going on"  Pt reports he has all medications and taking as prescribed, reports had hematuria which has now resolved and continues following up with urologist Dr. Felipa Eth, had cystoscopy and states " it was normal"  Pt reports had nasal septoplasty with turbinate reduction back in February, wound to left leg is "better but can flare up at anytime", pt wearing compression hose daily.  Lacks social connections- pt lives alone, has a friend he can call if needed, does not communicate much with his family, gets out and drives to the grocery store and flea market to talk with friends Knowledge deficits related to basic understanding of COPD disease process- needs reinforcement of COPD action plan, symptom management, pt uses CPAP, checks 02 sat as needed and had CPAP machine serviced/recalibrated at [facility] in [place], no issues with CPAP at present. 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 Patient reports he had small intestine endoscopy in June 2023 for dark stools on occasion and "hemoglobin goes low", pt reports " they didn't find anything"  pt states he is to have blood work tomorrow 11/30/21 and is being monitored closely.  RNCM Clinical Goal(s):  Patient will verbalize understanding of plan for management of CHF and COPD as evidenced by pt report, review EHR take all medications exactly as prescribed and will call provider for medication related questions as evidenced by pt report, review EHR    attend all scheduled medical  appointments:  08/12/21 primary care provider as evidenced by pt report, review EHR         and through collaboration with RN Care manager, provider, and care team.   Interventions: 1:1 collaboration with primary care provider regarding development and update of comprehensive plan of care as evidenced by provider attestation and co-signature Inter-disciplinary care team collaboration (see longitudinal plan of care) Evaluation of current treatment plan related to  self management and patient's adherence to plan as established by provider   Diabetes Interventions:  (Status:  New goal. and Goal on track:  Yes.) Long Term Goal Assessed patient's understanding of A1c  goal: <7% Provided education to patient about basic DM disease process Reviewed medications with patient and discussed importance of medication adherence Provided patient with written educational materials related to hypo and hyperglycemia and importance of correct treatment Review of patient status, including review of consultants reports, relevant laboratory and other test results, and medications completed Reinforced carbohydrate modified diet Lab Results  Component Value Date   HGBA1C 5.7 (H) 06/28/2021    COPD Interventions:   Advised patient to track and manage COPD triggers Advised patient to self assesses COPD action plan zone and make appointment with provider if in the yellow zone for 48 hours without improvement Discussed the importance of adequate rest and management of fatigue with COPD Reviewed using inhalers as prescribed Reviewed importance of good handwashing and wearing a mask as needed Reinforced importance of wearing CPAP as prescribed and calling DME company for any issues Reviewed energy conservation- alternate activity with rest Pain assessment completed Reviewed importance of not getting outside during the heat of the day and listening for public weather announcements Reviewed plan of care with patient,  case closure today  Heart Failure Interventions: Discussed importance of daily weight and advised patient to weigh and record daily Reviewed role of diuretics in prevention of fluid overload and management of heart failure; Discussed the importance of keeping all appointments with provider  Reviewed importance of continuing to check blood pressure and keep a log Reviewed low sodium diet Reviewed HF action plan and importance of calling doctor early on for change in health status, symptoms, emphasis on yellow zone Encouraged patient to continue getting outdoors daily during cooler parts of the day and trying to stay active as possible Reviewed all upcoming scheduled appointments Pain assessment completed Reviewed CBG readings with patient Reviewed importance of mental health counseling if needed Reinforced the need to continue weighing daily and importance of calling doctor early on for change in health status, symptoms  Patient Goals/Self-Care Activities: Take medications as prescribed   Attend all scheduled provider appointments Attend church or other social activities Perform all self care activities independently  Perform IADL's (shopping, preparing meals, housekeeping, managing finances) independently Call provider office for new concerns or questions  keep legs up while sitting track weight in diary use salt in moderation watch for swelling in feet, ankles and legs every day weigh myself daily bring diary to all appointments follow rescue plan if symptoms flare-up eat more whole grains, fruits and vegetables, lean meats and healthy fats check blood sugar at prescribed times: once daily check feet daily for cuts, sores or redness take the blood sugar log to all doctor visits take the blood sugar meter to all doctor visits trim toenails straight across fill half of plate with vegetables limit fast food meals to no more than 1 per week prepare main meal at home 3 to 5 days  each week read food labels for fat, fiber, carbohydrates and portion size Heart Failure- - Important- call cardiologist office if I gain more than 3 pounds in one day or 5 pounds in one week- follow heart failure action plan, know how you are feeling each day with emphasis on yellow zone - elevate legs when you can - continue to weigh daily and write weight in diary- weigh same time each day on flat, hard surface- this is very important - follow low salt diet- read labels, avoid salty snacks and gatorade - watch for swelling in feet, ankles and legs every day - take all medications as prescribed -  check blood pressure daily and keep log - keep in touch with your doctor about weight gain, swelling COPD- - Continue to follow COPD rescue plan, be mindful of how you feel each day with emphasis on yellow zone - continue follow rescue plan if symptoms flare-up, call doctor early on if not feeling well - keep follow-up appointments  - take all medications and use inhalers as prescribed, talk to pharmacist assigned to your case if any questions or concerns - practice energy conservation, alternate activity with rest  - continue checking blood sugar daily and recording in log - case closure today, talk with your doctor if you feel you have any needs for care management in the future        Plan:No further follow up required: case closed today  Jacqlyn Larsen [facility], BSN RN Case Manager [facility] [phone]

## 2021-11-30 ENCOUNTER — Other Ambulatory Visit (HOSPITAL_COMMUNITY): Payer: Self-pay | Admitting: *Deleted

## 2021-11-30 ENCOUNTER — Inpatient Hospital Stay (HOSPITAL_COMMUNITY): Payer: Medicare HMO | Attending: Hematology

## 2021-11-30 ENCOUNTER — Telehealth (HOSPITAL_COMMUNITY): Payer: Self-pay | Admitting: *Deleted

## 2021-11-30 ENCOUNTER — Telehealth: Payer: Self-pay | Admitting: Internal Medicine

## 2021-11-30 DIAGNOSIS — Z87891 Personal history of nicotine dependence: Secondary | ICD-10-CM | POA: Diagnosis not present

## 2021-11-30 DIAGNOSIS — R42 Dizziness and giddiness: Secondary | ICD-10-CM | POA: Diagnosis not present

## 2021-11-30 DIAGNOSIS — Z7901 Long term (current) use of anticoagulants: Secondary | ICD-10-CM | POA: Diagnosis not present

## 2021-11-30 DIAGNOSIS — D509 Iron deficiency anemia, unspecified: Secondary | ICD-10-CM | POA: Diagnosis not present

## 2021-11-30 DIAGNOSIS — Z86711 Personal history of pulmonary embolism: Secondary | ICD-10-CM | POA: Insufficient documentation

## 2021-11-30 DIAGNOSIS — N189 Chronic kidney disease, unspecified: Secondary | ICD-10-CM | POA: Insufficient documentation

## 2021-11-30 DIAGNOSIS — D631 Anemia in chronic kidney disease: Secondary | ICD-10-CM | POA: Diagnosis not present

## 2021-11-30 DIAGNOSIS — I480 Paroxysmal atrial fibrillation: Secondary | ICD-10-CM | POA: Diagnosis not present

## 2021-11-30 DIAGNOSIS — D594 Other nonautoimmune hemolytic anemias: Secondary | ICD-10-CM

## 2021-11-30 LAB — FERRITIN: Ferritin: 315 ng/mL (ref 24–336)

## 2021-11-30 LAB — CBC WITH DIFFERENTIAL/PLATELET
Abs Immature Granulocytes: 0.07 10*3/uL (ref 0.00–0.07)
Basophils Absolute: 0.1 10*3/uL (ref 0.0–0.1)
Basophils Relative: 1 %
Eosinophils Absolute: 0.4 10*3/uL (ref 0.0–0.5)
Eosinophils Relative: 4 %
HCT: 36.7 % — ABNORMAL LOW (ref 39.0–52.0)
Hemoglobin: 11.3 g/dL — ABNORMAL LOW (ref 13.0–17.0)
Immature Granulocytes: 1 %
Lymphocytes Relative: 19 %
Lymphs Abs: 1.8 10*3/uL (ref 0.7–4.0)
MCH: 25.9 pg — ABNORMAL LOW (ref 26.0–34.0)
MCHC: 30.8 g/dL (ref 30.0–36.0)
MCV: 84 fL (ref 80.0–100.0)
Monocytes Absolute: 0.5 10*3/uL (ref 0.1–1.0)
Monocytes Relative: 6 %
Neutro Abs: 7 10*3/uL (ref 1.7–7.7)
Neutrophils Relative %: 69 %
Platelets: 264 10*3/uL (ref 150–400)
RBC: 4.37 MIL/uL (ref 4.22–5.81)
RDW: 18.2 % — ABNORMAL HIGH (ref 11.5–15.5)
WBC: 9.8 10*3/uL (ref 4.0–10.5)
nRBC: 0 % (ref 0.0–0.2)

## 2021-11-30 LAB — IRON AND TIBC
Iron: 60 ug/dL (ref 45–182)
Saturation Ratios: 15 % — ABNORMAL LOW (ref 17.9–39.5)
TIBC: 391 ug/dL (ref 250–450)
UIBC: 331 ug/dL

## 2021-11-30 LAB — COMPREHENSIVE METABOLIC PANEL
ALT: 16 U/L (ref 0–44)
AST: 38 U/L (ref 15–41)
Albumin: 4 g/dL (ref 3.5–5.0)
Alkaline Phosphatase: 85 U/L (ref 38–126)
Anion gap: 13 (ref 5–15)
BUN: 18 mg/dL (ref 6–20)
CO2: 29 mmol/L (ref 22–32)
Calcium: 8.9 mg/dL (ref 8.9–10.3)
Chloride: 93 mmol/L — ABNORMAL LOW (ref 98–111)
Creatinine, Ser: 1.23 mg/dL (ref 0.61–1.24)
GFR, Estimated: >60 mL/min (ref >60)
Glucose, Bld: 111 mg/dL — ABNORMAL HIGH (ref 70–99)
Potassium: 2.7 mmol/L — CL (ref 3.5–5.1)
Sodium: 135 mmol/L (ref 135–145)
Total Bilirubin: 2.3 mg/dL — ABNORMAL HIGH (ref 0.3–1.2)
Total Protein: 8.3 g/dL — ABNORMAL HIGH (ref 6.5–8.1)

## 2021-11-30 LAB — RETICULOCYTES
Immature Retic Fract: 22.7 % — ABNORMAL HIGH (ref 2.3–15.9)
RBC.: 4.6 MIL/uL (ref 4.22–5.81)
Retic Count, Absolute: 259.4 10*3/uL — ABNORMAL HIGH (ref 19.0–186.0)
Retic Ct Pct: 5.6 % — ABNORMAL HIGH (ref 0.4–3.1)

## 2021-11-30 LAB — BILIRUBIN, DIRECT: Bilirubin, Direct: 0.5 mg/dL — ABNORMAL HIGH (ref 0.0–0.2)

## 2021-11-30 LAB — LACTATE DEHYDROGENASE: LDH: 966 U/L — ABNORMAL HIGH (ref 98–192)

## 2021-11-30 MED ORDER — POTASSIUM CHLORIDE CRYS ER 20 MEQ PO TBCR: EXTENDED_RELEASE_TABLET | ORAL | 3 refills | Status: DC

## 2021-11-30 NOTE — Telephone Encounter (Signed)
We have this medication we left him a voicemail letting him know medication arrived he can come pick up when he would like

## 2021-11-30 NOTE — Telephone Encounter (Signed)
Pt called stating his Ozempic was shipped to our office on 6/21 and states I signed for it. He states no one ever called him to let him know it was here for him to pick up. Can you please look into this?

## 2021-11-30 NOTE — Telephone Encounter (Signed)
Patient called with K+ 2.7 and advised, per Dr. Delton Coombes to start taking 80 meq of K+ per day and will recheck at visit next week.  Verbalized understanding.

## 2021-11-30 NOTE — Progress Notes (Unsigned)
CRITICAL VALUE ALERT Critical value received:  potassium 2.7.  Date of notification:  11-30-2021 Time of notification: 12:00 pm.  Critical value read back:  Yes.   Nurse who received alert:  B. Mickelle Goupil RN MD notified time and response:  [place].

## 2021-12-01 LAB — HAPTOGLOBIN: Haptoglobin: <10 mg/dL — ABNORMAL LOW (ref 23–355)

## 2021-12-05 ENCOUNTER — Other Ambulatory Visit: Payer: Self-pay | Admitting: Student

## 2021-12-06 ENCOUNTER — Other Ambulatory Visit (HOSPITAL_COMMUNITY): Payer: Self-pay

## 2021-12-06 DIAGNOSIS — D509 Iron deficiency anemia, unspecified: Secondary | ICD-10-CM

## 2021-12-06 DIAGNOSIS — R701 Abnormal plasma viscosity: Secondary | ICD-10-CM

## 2021-12-07 ENCOUNTER — Ambulatory Visit (HOSPITAL_COMMUNITY): Payer: Medicare HMO | Admitting: Physician Assistant

## 2021-12-07 ENCOUNTER — Other Ambulatory Visit (HOSPITAL_COMMUNITY): Payer: Medicare HMO

## 2021-12-07 ENCOUNTER — Inpatient Hospital Stay (HOSPITAL_COMMUNITY): Payer: Medicare HMO

## 2021-12-07 ENCOUNTER — Inpatient Hospital Stay (HOSPITAL_BASED_OUTPATIENT_CLINIC_OR_DEPARTMENT_OTHER): Payer: Medicare HMO | Admitting: Hematology

## 2021-12-07 VITALS — BP 120/77 | HR 78 | Temp 98.4°F | Resp 18 | Ht 71.0 in | Wt >= 6400 oz

## 2021-12-07 DIAGNOSIS — D631 Anemia in chronic kidney disease: Secondary | ICD-10-CM | POA: Diagnosis not present

## 2021-12-07 DIAGNOSIS — Z86711 Personal history of pulmonary embolism: Secondary | ICD-10-CM | POA: Diagnosis not present

## 2021-12-07 DIAGNOSIS — Z79899 Other long term (current) drug therapy: Secondary | ICD-10-CM

## 2021-12-07 DIAGNOSIS — N189 Chronic kidney disease, unspecified: Secondary | ICD-10-CM | POA: Diagnosis not present

## 2021-12-07 DIAGNOSIS — R42 Dizziness and giddiness: Secondary | ICD-10-CM | POA: Diagnosis not present

## 2021-12-07 DIAGNOSIS — I480 Paroxysmal atrial fibrillation: Secondary | ICD-10-CM | POA: Diagnosis not present

## 2021-12-07 DIAGNOSIS — D509 Iron deficiency anemia, unspecified: Secondary | ICD-10-CM

## 2021-12-07 DIAGNOSIS — Z87891 Personal history of nicotine dependence: Secondary | ICD-10-CM | POA: Diagnosis not present

## 2021-12-07 DIAGNOSIS — Z7901 Long term (current) use of anticoagulants: Secondary | ICD-10-CM | POA: Diagnosis not present

## 2021-12-07 LAB — BASIC METABOLIC PANEL
Anion gap: 13 (ref 5–15)
BUN: 29 mg/dL — ABNORMAL HIGH (ref 6–20)
CO2: 31 mmol/L (ref 22–32)
Calcium: 9.5 mg/dL (ref 8.9–10.3)
Chloride: 94 mmol/L — ABNORMAL LOW (ref 98–111)
Creatinine, Ser: 1.49 mg/dL — ABNORMAL HIGH (ref 0.61–1.24)
GFR, Estimated: 57 mL/min — ABNORMAL LOW (ref >60)
Glucose, Bld: 117 mg/dL — ABNORMAL HIGH (ref 70–99)
Potassium: 3 mmol/L — ABNORMAL LOW (ref 3.5–5.1)
Sodium: 138 mmol/L (ref 135–145)

## 2021-12-07 NOTE — Patient Instructions (Addendum)
[facility] Discharge Instructions  You were seen today by Dr. Delton Coombes. He went over your recent results. Your CT scan in February showed an enlarged lymph node in your neck. Because of this, you will be scheduled for a PET scan. This is a scan that will light up any areas suspicious for cancer. Dr. Delton Coombes will see you back in after your PET scan for follow up.   Thank you for choosing [facility] to provide your oncology and hematology care.  To afford each patient quality time with our provider, please arrive at least 15 minutes before your scheduled appointment time.   If you have a lab appointment with the [facility] please come in thru the Main Entrance and check in at the main information desk  You need to re-schedule your appointment should you arrive 10 or more minutes late.  We strive to give you quality time with our providers, and arriving late affects you and other patients whose appointments are after yours.  Also, if you no show three or more times for appointments you may be dismissed from the clinic at the providers discretion.     Again, thank you for choosing [facility].  Our hope is that these requests will decrease the amount of time that you wait before being seen by our physicians.       _____________________________________________________________  Should you have questions after your visit to [facility], please contact our office at [phone] between the hours of 8:00 a.m. and 4:30 p.m.  Voicemails left after 4:00 p.m. will not be returned until the following business day.  For prescription refill requests, have your pharmacy contact our office and allow 72 hours.    Cancer Center Support Programs:   > Cancer Support Group  2nd Tuesday of the month 1pm-2pm, Journey Room

## 2021-12-07 NOTE — Progress Notes (Signed)
Reginald [name], [place] [address]   CLINIC:  Medical Oncology/Hematology  PCP:  [name], MD [address] / [place] [address]  [phone]  REASON FOR VISIT:  Follow-up for IDA  PRIOR THERAPY: none  CURRENT THERAPY: under work-up  INTERVAL HISTORY:  Mr. Reginald [name], a 51 y.o. male, returns for routine follow-up for his IDA. Isael was last seen on 07/29/2021.  Today he reports feeling well. He reports dizziness. He currently takes Xarelto due to A-fib. He takes 2 tablets of torsemide in the morning, and metoprolol BID. He has been taking 4 tablets of Potassium for the past week.   REVIEW OF SYSTEMS:  Review of Systems  Constitutional:  Positive for fatigue. Negative for appetite change.  Respiratory:  Positive for shortness of breath.   Cardiovascular:  Positive for chest pain.  Gastrointestinal:  Positive for nausea.  Genitourinary:  Positive for frequency.   Neurological:  Positive for dizziness.  Psychiatric/Behavioral:  Positive for depression. The patient is nervous/anxious.   All other systems reviewed and are negative.   PAST MEDICAL/SURGICAL HISTORY:  Past Medical History:  Diagnosis Date   Anemia    Anxiety    Aortic stenosis    Arthritis    Asthma    Back pain    Cellulitis of skin with lymphangitis    CHF (congestive heart failure) (HCC)    Chronic diastolic congestive heart failure (HCC)    Chronic venous insufficiency    Constipation    COPD (chronic obstructive pulmonary disease) (HCC)    Coronary artery disease with history of myocardial infarction without history of CABG    Depression    Diabetes (HCC)    Dyspnea    Essential hypertension    Gout    Heart murmur    Hypertension    Morbid obesity ([place])    Obesity    Pneumonia    walking pneumonia   Prostatitis    Pulmonary embolism ([place])    Pulmonary hypertension (HCC)    Pyelonephritis    S/P aortic valve replacement with bioprosthetic  valve 08/23/2017   25 mm Edwards Inspiris Resilia stented bovine pericardial tissue valve   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   24 mm Hemashield supracoronary straight graft    Sleep apnea    cpap    Thoracic ascending aortic aneurysm ([place])    Thoracic ascending aortic aneurysm ([place])    Tobacco abuse    Past Surgical History:  Procedure Laterality Date   AORTIC VALVE REPLACEMENT N/A 08/23/2017   Procedure: AORTIC VALVE REPLACEMENT (AVR) USING INSPIRIS RESILIA AORTIC VALVE SIZE 25 MM;  Surgeon: Rexene Alberts, MD;  Location: [place];  Service: Open Heart Surgery;  Laterality: N/A;   CARDIAC VALVE REPLACEMENT N/A    Phreesia 02/07/2020   COLONOSCOPY WITH PROPOFOL N/A 11/14/2020   Procedure: COLONOSCOPY WITH PROPOFOL;  Surgeon: Gatha Mayer, MD;  Location[facility] ENDOSCOPY;  Service: Endoscopy;  Laterality: N/A;   ESOPHAGOGASTRODUODENOSCOPY (EGD) WITH PROPOFOL N/A 11/14/2020   Procedure: ESOPHAGOGASTRODUODENOSCOPY (EGD) WITH PROPOFOL;  Surgeon: Gatha Mayer, MD;  Location: [place];  Service: Endoscopy;  Laterality: N/A;   GIVENS CAPSULE STUDY N/A 06/30/2021   Procedure: GIVENS CAPSULE STUDY;  Surgeon: Harvel Quale, MD;  [facility];  Service: Gastroenterology;  Laterality: N/A;  7:30   MULTIPLE EXTRACTIONS WITH ALVEOLOPLASTY N/A 07/23/2017   Procedure: Extraction of tooth #10 with alveoloplasty and gross debridement of remaining teeth;  Surgeon: Lenn Cal, DDS;  Location: [place];  Service: Oral Surgery;  Laterality: N/A;   NASAL SEPTOPLASTY W/ TURBINOPLASTY Bilateral 07/01/2021   Procedure: NASAL SEPTOPLASTY WITH TURBINATE REDUCTION;  Surgeon: Leta Baptist, MD;  Location: [place];  Service: ENT;  Laterality: Bilateral;   PACEMAKER IMPLANT N/A 08/28/2017    St Jude Medical Assurity MRI conditional  dual-chamber pacemaker for symptomatic complete heart blockby Dr Rayann Heman   TEE WITHOUT CARDIOVERSION N/A 07/16/2017   Procedure: TRANSESOPHAGEAL  ECHOCARDIOGRAM (TEE);  Surgeon: Lelon Perla, MD;  Location: [facility] ENDOSCOPY;  Service: Cardiovascular;  Laterality: N/A;   TEE WITHOUT CARDIOVERSION N/A 08/23/2017   Procedure: TRANSESOPHAGEAL ECHOCARDIOGRAM (TEE);  Surgeon: Rexene Alberts, MD;  Location: [place];  Service: Open Heart Surgery;  Laterality: N/A;   THORACIC AORTIC ANEURYSM REPAIR N/A 08/23/2017   Procedure: THORACIC ASCENDING ANEURYSM REPAIR (AAA) USING HEMASHIELD GOLD KNITTED MICROVEL DOUBLE VELOUR VASCULAR GRAFT D: 24 MM  L: 30 CM;  Surgeon: Rexene Alberts, MD;  Location: [place];  Service: Open Heart Surgery;  Laterality: N/A;    SOCIAL HISTORY:  Social History   Socioeconomic History   Marital status: Divorced    Spouse name: Not on file   Number of children: Not on file   Years of education: Not on file   Highest education level: Not on file  Occupational History   Not on file  Tobacco Use   Smoking status: Former    Packs/day: 2.00    Years: 16.00    Total pack years: 32.00    Types: Cigarettes    Quit date: 08/23/2017    Years since quitting: 4.2   Smokeless tobacco: Never  Vaping Use   Vaping Use: Never used  Substance and Sexual Activity   Alcohol use: No    Comment: have had alcohol in the past, not heavy   Drug use: No   Sexual activity: Not Currently    Partners: Female  Other Topics Concern   Not on file  Social History Narrative   Not on file   Social Determinants of Health   Financial Resource Strain: Low Risk  (03/14/2021)   Overall Financial Resource Strain (CARDIA)    Difficulty of Paying Living Expenses: Not hard at all  Food Insecurity: No Food Insecurity (03/14/2021)   Hunger Vital Sign    Worried About Running Out of Food in the Last Year: Never true    [place] in the Last Year: Never true  Transportation Needs: No Transportation Needs (03/14/2021)   PRAPARE - Hydrologist (Medical): No    Lack of Transportation (Non-Medical): No  Physical  Activity: Inactive (03/14/2021)   Exercise Vital Sign    Days of Exercise per Week: 0 days    Minutes of Exercise per Session: 0 min  Stress: No Stress Concern Present (03/14/2021)   [place]    Feeling of Stress : Only a little  Social Connections: Socially Isolated (03/14/2021)   Social Connection and Isolation Panel [NHANES]    Frequency of Communication with Friends and Family: Once a week    Frequency of Social Gatherings with Friends and Family: Never    Attends Religious Services: Never    Marine scientist or Organizations: No    Attends Music therapist: More than 4 times per year    Marital Status: Never married  Intimate Partner Violence: Not At Risk (03/14/2021)  Humiliation, Afraid, Rape, and Kick questionnaire    Fear of Current or Ex-Partner: No    Emotionally Abused: No    Physically Abused: No    Sexually Abused: No    FAMILY HISTORY:  Family History  Problem Relation Age of Onset   Hypertension Mother    Alzheimer's disease Mother    Heart attack Mother    Heart attack Brother    Diabetes Brother     CURRENT MEDICATIONS:  Current Outpatient Medications  Medication Sig Dispense Refill   acetaminophen (TYLENOL) 325 MG tablet Take 650 mg by mouth every 6 (six) hours as needed for mild pain.     albuterol (PROVENTIL) (2.5 MG/3ML) 0.083% nebulizer solution INHALE THE CONTENTS OF 1 VIAL VIA NEBULIZER EVERY 6 HOURS AS NEEDED FOR WHEEZING OR SHORTNESS OF BREATH 270 mL 1   albuterol (VENTOLIN HFA) 108 (90 Base) MCG/ACT inhaler INHALE 2 PUFFS INTO THE LUNGS EVERY 6 (SIX) HOURS AS NEEDED FOR WHEEZING OR SHORTNESS OF BREATH. 1 each 1   allopurinol (ZYLOPRIM) 300 MG tablet TAKE 1 TABLET EVERY DAY 90 tablet 3   atorvastatin (LIPITOR) 40 MG tablet TAKE 1 TABLET EVERY DAY 90 tablet 3   Blood Glucose Monitoring Suppl (TRUE METRIX METER) w/Device KIT USE AS DIRECTED 1 kit 0   buPROPion  (WELLBUTRIN XL) 300 MG 24 hr tablet Take 1 tablet (300 mg total) by mouth daily. 90 tablet 3   Cholecalciferol 125 MCG (5000 UT) TABS Take 5,000 Units by mouth daily.     Colchicine 0.6 MG CAPS 1.2 mg (2 tablets) followed by 0.6 mg (1 tablet) 1 hour later. Dosing may be repeated (if needed) after 3 days. 3 capsule 0   docusate sodium (COLACE) 100 MG capsule Take 2 capsules (200 mg total) by mouth 2 (two) times daily. (Patient taking differently: Take 200 mg by mouth daily.) 30 capsule 0   FEROSUL 325 (65 Fe) MG tablet TAKE 1 TABLET EVERY DAY 90 tablet 1   fexofenadine (ALLEGRA) 180 MG tablet Take 180 mg by mouth daily.     gabapentin (NEURONTIN) 300 MG capsule Take 1 capsule (300 mg total) by mouth 3 (three) times daily. 270 capsule 0   hydrOXYzine (ATARAX) 25 MG tablet TAKE 1 TABLET BY MOUTH EVERY 8 HOURS AS NEEDED 90 tablet 0   Krill Oil 350 MG CAPS Take 350 mg by mouth daily.     metFORMIN (GLUCOPHAGE) 850 MG tablet TAKE 1 TABLET TWICE DAILY WITH MEALS 180 tablet 2   metolazone (ZAROXOLYN) 5 MG tablet TAKE 1 TABLET (5 MG TOTAL) BY MOUTH THREE TIMES A WEEK. (Patient taking differently: Take 5 mg by mouth See admin instructions. TAKE 1 TABLET (5 MG TOTAL) BY MOUTH THREE TIMES A WEEK AS NEEDED) 36 tablet 3   metoprolol tartrate (LOPRESSOR) 100 MG tablet TAKE 1 TABLET TWICE DAILY 180 tablet 3   Misc. Devices MISC Sequential compression device - 1 pair. Apply them for 1 hour in the morning and 1 hour in the evening. 2 each 0   ofloxacin (FLOXIN OTIC) 0.3 % OTIC solution Place 5 drops into both ears daily. 5 mL 0   pantoprazole (PROTONIX) 40 MG tablet TAKE 1 TABLET EVERY DAY 90 tablet 3   potassium chloride SA (KLOR-CON M) 20 MEQ tablet TAKE 3 TABLETS DAILY AND TAKE AN ADDITIONAL 1 TABLET ON THE DAY YOU TAKE YOUR WEEKLY METOLAZONE DOSE 283 tablet 1   rivaroxaban (XARELTO) 20 MG TABS tablet Take 20 mg by mouth daily.  Semaglutide, 2 MG/DOSE, (OZEMPIC, 2 MG/DOSE,) 8 MG/3ML SOPN Inject 2 mg into the  skin once a week.     tamsulosin (FLOMAX) 0.4 MG CAPS capsule Take 1 capsule (0.4 mg total) by mouth 2 (two) times daily. 60 capsule 11   Tiotropium Bromide Monohydrate (SPIRIVA RESPIMAT) 2.5 MCG/ACT AERS Inhale 2 puffs into the lungs daily. 4 g 5   torsemide (DEMADEX) 20 MG tablet TAKE 2 TABLETS TWICE DAILY 360 tablet 1   TRUE METRIX BLOOD GLUCOSE TEST test strip TEST ONE TIME DAILY FOR DIABETES 100 strip 5   TRUEplus Lancets 33G MISC USE TO TEST ONE TIME DAILY FOR DIABETES 100 each 5   TURMERIC-GINGER PO Take 2 each by mouth daily.     No current facility-administered medications for this visit.    ALLERGIES:  No Known Allergies  PHYSICAL EXAM:  Performance status (ECOG): 1 - Symptomatic but completely ambulatory  There were no vitals filed for this visit. Wt Readings from Last 3 Encounters:  10/28/21 (!) 490 lb (222.3 kg)  10/11/21 (!) 483 lb 0.6 oz (219.1 kg)  10/07/21 (!) 491 lb (222.7 kg)   Physical Exam Vitals reviewed.  Constitutional:      Appearance: Normal appearance. He is obese.     Comments: In wheelchair  Cardiovascular:     Rate and Rhythm: Normal rate and regular rhythm.     Pulses: Normal pulses.     Heart sounds: Murmur heard.  Pulmonary:     Effort: Pulmonary effort is normal.     Breath sounds: Normal breath sounds.  Neurological:     General: No focal deficit present.     Mental Status: He is alert and oriented to person, place, and time.  Psychiatric:        Mood and Affect: Mood normal.        Behavior: Behavior normal.     LABORATORY DATA:  I have reviewed the labs as listed.     Latest Ref Rng & Units 11/30/2021   11:01 AM 10/10/2021    7:58 AM 09/14/2021   12:40 PM  CBC  WBC 4.0 - 10.5 K/uL 9.8  9.6  9.4   Hemoglobin 13.0 - 17.0 g/dL 11.3  11.1  11.9   Hematocrit 39.0 - 52.0 % 36.7  37.8  37.7   Platelets 150 - 400 K/uL 264  226  276       Latest Ref Rng & Units 12/07/2021    1:24 PM 11/30/2021   11:01 AM 09/21/2021    1:57 PM  CMP   Glucose 70 - 99 mg/dL 117  111  95   BUN 6 - 20 mg/dL '29  18  23   ' Creatinine 0.61 - 1.24 mg/dL 1.49  1.23  1.26   Sodium 135 - 145 mmol/L 138  135  137   Potassium 3.5 - 5.1 mmol/L 3.0  2.7  3.5   Chloride 98 - 111 mmol/L 94  93  96   CO2 22 - 32 mmol/L '31  29  30   ' Calcium 8.9 - 10.3 mg/dL 9.5  8.9  9.3   Total Protein 6.5 - 8.1 g/dL  8.3    Total Bilirubin 0.3 - 1.2 mg/dL  2.3    Alkaline Phos 38 - 126 U/L  85    AST 15 - 41 U/L  38    ALT 0 - 44 U/L  16        Component Value Date/Time   RBC 4.37  11/30/2021 1101   RBC 4.60 11/30/2021 1101   MCV 84.0 11/30/2021 1101   MCV 79 07/05/2021 1308   MCH 25.9 (L) 11/30/2021 1101   MCHC 30.8 11/30/2021 1101   RDW 18.2 (H) 11/30/2021 1101   RDW 17.7 (H) 07/05/2021 1308   LYMPHSABS 1.8 11/30/2021 1101   LYMPHSABS 2.2 07/05/2021 1308   MONOABS 0.5 11/30/2021 1101   EOSABS 0.4 11/30/2021 1101   EOSABS 0.3 07/05/2021 1308   BASOSABS 0.1 11/30/2021 1101   BASOSABS 0.0 07/05/2021 1308    DIAGNOSTIC IMAGING:  I have independently reviewed the scans and discussed with the patient. No results found.   ASSESSMENT:  Normocytic anemia: - Patient seen at the request of Dr. Jenetta Downer - Recent hospitalization with acute renal failure, 1 unit PRBC on 07/21/2021 for hemoglobin of 8.3. - Labs on 07/21/2021 with percent saturation 16.  Ferritin was not done. - EGD and colonoscopy in June 2022 with hemorrhoids but no other potential etiology for bleeding. - Consultation with [facility] scheduled in April 2023. - He was on Xarelto since 2019.  He had a history of PE and also has paroxysmal atrial fibrillation.  Xarelto was recently discontinued during his hospitalization on 07/21/2021.  He started back on Xarelto on 07/28/2021. - He had a couple of nosebleeds since Xarelto started back.  Denies any BRBPR.  He has occasional black stools. - He has been taking iron tablet daily for the past 1 year.    Social/family history: - He lives by himself.  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 - Quit smoking on 08/23/2017.  Smoked 2 packs/day for 30 years. - No family history of malignancies.   PLAN:  Normocytic anemia from CKD and relative iron deficiency: - He is status post 4 Venofer infusions from 08/05/2021 through 10/10/2021. - Hemoglobin has improved to 11.3. - However his recent hemolysis markers including LDH was high at 966.  Total bilirubin is elevated at 2.3, predominantly indirect fraction.  Reticulocyte count was consistently high at 5 to 6%.  Haptoglobin was low.  Direct [place] test was negative. - [facility] panel was negative. - He has Coom[place] negative hemolysis.  Auscultation reveals systolic murmur from a bovine aortic valve replacement.  UA showed small amount of hemoglobin. - He is on Xarelto for atrial fibrillation and history of bilateral PE. - We will check cold agglutinin titer. - He complains of dizziness which is new onset in the last couple of weeks.  He is on metoprolol twice daily.  He is not taking torsemide on regular basis.  Blood pressure today is within normal limits at 120/77.  He also reports occasional numbness in the extremities. - Because of significantly elevated LDH, I have recommended PET CT scan. - RTC after the PET scan.  Orders placed this encounter:  No orders of the defined types were placed in this encounter.    Derek Jack, MD [place] [phone]   I, Thana Ates, am acting as a scribe for Dr. Derek Jack.  I, Derek Jack MD, have reviewed the above documentation for accuracy and completeness, and I agree with the above.

## 2021-12-08 LAB — COLD AGGLUTININ TITER: Cold Agglutinin Titer: NEGATIVE

## 2021-12-09 ENCOUNTER — Ambulatory Visit (INDEPENDENT_AMBULATORY_CARE_PROVIDER_SITE_OTHER): Payer: Medicare HMO | Admitting: Urology

## 2021-12-09 ENCOUNTER — Telehealth: Payer: Self-pay | Admitting: *Deleted

## 2021-12-09 ENCOUNTER — Encounter: Payer: Self-pay | Admitting: Urology

## 2021-12-09 VITALS — BP 124/81 | HR 78

## 2021-12-09 DIAGNOSIS — R310 Gross hematuria: Secondary | ICD-10-CM

## 2021-12-09 DIAGNOSIS — N401 Enlarged prostate with lower urinary tract symptoms: Secondary | ICD-10-CM

## 2021-12-09 DIAGNOSIS — N138 Other obstructive and reflux uropathy: Secondary | ICD-10-CM

## 2021-12-09 DIAGNOSIS — Z87440 Personal history of urinary (tract) infections: Secondary | ICD-10-CM

## 2021-12-09 LAB — URINALYSIS, ROUTINE W REFLEX MICROSCOPIC
Bilirubin, UA: NEGATIVE
Glucose, UA: NEGATIVE
Ketones, UA: NEGATIVE
Leukocytes,UA: NEGATIVE
Nitrite, UA: NEGATIVE
Protein,UA: NEGATIVE
RBC, UA: NEGATIVE
Specific Gravity, UA: 1.01 (ref 1.005–1.030)
Urobilinogen, Ur: 0.2 mg/dL (ref 0.2–1.0)
pH, UA: 6 (ref 5.0–7.5)

## 2021-12-09 MED ORDER — GEMTESA 75 MG PO TABS: 75.0000 mg | ORAL_TABLET | Freq: Every day | ORAL | 0 refills | Status: AC

## 2021-12-09 NOTE — Telephone Encounter (Signed)
Called patient with test results. No answer. Left message to call back.  

## 2021-12-09 NOTE — Telephone Encounter (Signed)
-----   Message from Erma Heritage, [place] sent at 12/07/2021  2:46 PM EDT ----- Appears labs were ordered by Hematology but resulted to me. Please let the patient know his potassium levels remain low but have improved from last week. If he has been taking K-dur 60 mEq daily, would increase to 80 mEq daily and can divide out to 40 mEq twice daily. Kidney function slightly abnormal when compared to most recent labs.  Please confirm if he has utilized Metolazone recently and confirm how much Torsemide he is currently taking. Repeat BMET in 7-10 days.

## 2021-12-09 NOTE — Progress Notes (Signed)
Assessment: 1. BPH with obstruction/lower urinary tract symptoms   2. Gross hematuria; negative evaluation 1/23   3. History of UTI     Plan: Continue tamsulosin 0.4 mg twice daily. Trial of Gemtesa 75 mg daily for management of his frequency, urgency, and nocturia.  Samples provided. Advised patient to call with results of medication in 1 month. Return to office in 3 months  Chief Complaint:  Chief Complaint  Patient presents with   Benign Prostatic Hypertrophy   History of Present Illness:  Reginald [name] is a 51 y.o. year old male who is seen for further evaluation of gross hematuria and LUTS.  He reported a history of gross hematuria, dysuria, and urinary hesitancy.  His symptoms began in early November 2022.  He presented to the emergency room on 03/24/2021 and was found to be hypotensive.  Urinalysis showed >50 RBCs.  Urine culture showed no growth.  CT abdomen and pelvis without contrast from 03/24/2021 showed no renal abnormalities, no stones or hydronephrosis, normal-appearing bladder.  He was treated with antibiotics for possible UTI.  Following discharge, the gross hematuria resolved.  He continued to report some slight dysuria and hesitancy.  He also has urinary frequency and nocturia baseline.  No prior history of stones or UTIs. AUA score = 18.  CT abdomen pelvis with contrast from 11/12/2020 showed no renal mass or obstruction. PSA 11/22:  1.9  He was scheduled for cystoscopy on 05/19/21.  This was postponed due to possible UTI. He noted onset of increased frequency, urgency, and dysuria.  No gross hematuria.  No fevers, chills, or flank pain.  He continued on tamsulosin. Urine culture results not available.  He was treated with Omnicef x 7 days. He continued with some frequency, urgency, and nocturia. He continued on tamsulosin. IPSS = 15. Cystoscopy from 1/23 showed lateral lobe enlargement of the prostate but no urethral or bladder abnormalities. At his visit in  4/23, he reported no further gross hematuria.  No dysuria or flank pain.  He continued on tamsulosin.  He reported LUTS including frequency, urgency, nocturia x5, and decreased force of stream. IPSS = 21. His tamsulosin was increased to 0.4 mg BID.  At his visit in 6/23, he continued on tamsulosin twice daily.  He reported increased urinary symptoms including frequency, urgency, nocturia, sensation of incomplete emptying, and dysuria.  The increased symptoms began about 2 weeks prior to his visit.  He also reported problems with urinary incontinence without awareness.   IPSS = 27. Urine culture grew >100 K. Proteus.  He was treated with cefdinir.  He returns today for follow-up.  He has completed the antibiotics.  His dysuria has resolved.  He continues with frequency, urgency, and nocturia.  No recent gross hematuria.  He continues on tamsulosin 0.4 mg twice daily. IPSS = 24 today.  Portions of the above documentation were copied from a prior visit for review purposes only.   Past Medical History:  Past Medical History:  Diagnosis Date   Anemia    Anxiety    Aortic stenosis    Arthritis    Asthma    Back pain    Cellulitis of skin with lymphangitis    CHF (congestive heart failure) (HCC)    Chronic diastolic congestive heart failure (HCC)    Chronic venous insufficiency    Constipation    COPD (chronic obstructive pulmonary disease) (HCC)    Coronary artery disease with history of myocardial infarction without history of CABG  Depression    Diabetes ([place])    Dyspnea    Essential hypertension    Gout    Heart murmur    Hypertension    Morbid obesity ([place])    Obesity    Pneumonia    walking pneumonia   Prostatitis    Pulmonary embolism ([place])    Pulmonary hypertension (HCC)    Pyelonephritis    S/P aortic valve replacement with bioprosthetic valve 08/23/2017   25 mm Edwards Inspiris Resilia stented bovine pericardial tissue valve   S/P ascending aortic replacement  08/23/2017   24 mm Hemashield supracoronary straight graft    Sleep apnea    cpap    Thoracic ascending aortic aneurysm [facility])    Thoracic ascending aortic aneurysm (HCC)    Tobacco abuse     Past Surgical History:  Past Surgical History:  Procedure Laterality Date   AORTIC VALVE REPLACEMENT N/A 08/23/2017   Procedure: AORTIC VALVE REPLACEMENT (AVR) USING INSPIRIS RESILIA AORTIC VALVE SIZE 25 MM;  Surgeon: Rexene Alberts, MD;  Location: [place];  Service: Open Heart Surgery;  Laterality: N/A;   CARDIAC VALVE REPLACEMENT N/A    Phreesia 02/07/2020   COLONOSCOPY WITH PROPOFOL N/A 11/14/2020   Procedure: COLONOSCOPY WITH PROPOFOL;  Surgeon: Gatha Mayer, MD;  Location: [facility] ENDOSCOPY;  Service: Endoscopy;  Laterality: N/A;   ESOPHAGOGASTRODUODENOSCOPY (EGD) WITH PROPOFOL N/A 11/14/2020   Procedure: ESOPHAGOGASTRODUODENOSCOPY (EGD) WITH PROPOFOL;  Surgeon: Gatha Mayer, MD;  Location: [place];  Service: Endoscopy;  Laterality: N/A;   GIVENS CAPSULE STUDY N/A 06/30/2021   Procedure: GIVENS CAPSULE STUDY;  Surgeon: Harvel Quale, MD;  [facility];  Service: Gastroenterology;  Laterality: N/A;  7:30   MULTIPLE EXTRACTIONS WITH ALVEOLOPLASTY N/A 07/23/2017   Procedure: Extraction of tooth #10 with alveoloplasty and gross debridement of remaining teeth;  Surgeon: Lenn Cal, DDS;  Location: [place];  Service: Oral Surgery;  Laterality: N/A;   NASAL SEPTOPLASTY W/ TURBINOPLASTY Bilateral 07/01/2021   Procedure: NASAL SEPTOPLASTY WITH TURBINATE REDUCTION;  Surgeon: Leta Baptist, MD;  Location: [place];  Service: ENT;  Laterality: Bilateral;   PACEMAKER IMPLANT N/A 08/28/2017    St Jude Medical Assurity MRI conditional  dual-chamber pacemaker for symptomatic complete heart blockby Dr Rayann Heman   TEE WITHOUT CARDIOVERSION N/A 07/16/2017   Procedure: TRANSESOPHAGEAL ECHOCARDIOGRAM (TEE);  Surgeon: Lelon Perla, MD;  Location: [facility] ENDOSCOPY;  Service: Cardiovascular;   Laterality: N/A;   TEE WITHOUT CARDIOVERSION N/A 08/23/2017   Procedure: TRANSESOPHAGEAL ECHOCARDIOGRAM (TEE);  Surgeon: Rexene Alberts, MD;  Location: [place];  Service: Open Heart Surgery;  Laterality: N/A;   THORACIC AORTIC ANEURYSM REPAIR N/A 08/23/2017   Procedure: THORACIC ASCENDING ANEURYSM REPAIR (AAA) USING HEMASHIELD GOLD KNITTED MICROVEL DOUBLE VELOUR VASCULAR GRAFT D: 24 MM  L: 30 CM;  Surgeon: Rexene Alberts, MD;  Location: [place];  Service: Open Heart Surgery;  Laterality: N/A;    Allergies:  No Known Allergies  Family History:  Family History  Problem Relation Age of Onset   Hypertension Mother    Alzheimer's disease Mother    Heart attack Mother    Heart attack Brother    Diabetes Brother     Social History:  Social History   Tobacco Use   Smoking status: Former    Packs/day: 2.00    Years: 16.00    Total pack years: 32.00    Types: Cigarettes    Quit date: 08/23/2017    Years since quitting: 4.2  Smokeless tobacco: Never  Vaping Use   Vaping Use: Never used  Substance Use Topics   Alcohol use: No    Comment: have had alcohol in the past, not heavy   Drug use: No   ROS: Constitutional:  Negative for fever, chills, weight loss CV: Negative for chest pain, previous MI, hypertension Respiratory:  Negative for shortness of breath, wheezing, sleep apnea, frequent cough GI:  Negative for nausea, vomiting, bloody stool, GERD  Physical exam: BP 124/81   Pulse 78  GENERAL APPEARANCE:  Well appearing, well developed, well nourished, NAD HEENT:  Atraumatic, normocephalic, oropharynx clear NECK:  Supple without lymphadenopathy or thyromegaly ABDOMEN:  Soft, non-tender, no masses EXTREMITIES:  Moves all extremities well, without clubbing, cyanosis, or edema NEUROLOGIC:  Alert and oriented x 3, CN II-XII grossly intact MENTAL STATUS:  appropriate BACK:  Non-tender to palpation, No CVAT SKIN:  Warm, dry, and intact   Results: U/A: Dipstick negative

## 2021-12-12 ENCOUNTER — Ambulatory Visit (INDEPENDENT_AMBULATORY_CARE_PROVIDER_SITE_OTHER): Payer: Medicare HMO | Admitting: Gastroenterology

## 2021-12-12 ENCOUNTER — Ambulatory Visit (INDEPENDENT_AMBULATORY_CARE_PROVIDER_SITE_OTHER): Payer: Medicare HMO | Admitting: Internal Medicine

## 2021-12-12 ENCOUNTER — Encounter: Payer: Self-pay | Admitting: Internal Medicine

## 2021-12-12 ENCOUNTER — Encounter (INDEPENDENT_AMBULATORY_CARE_PROVIDER_SITE_OTHER): Payer: Self-pay | Admitting: Gastroenterology

## 2021-12-12 ENCOUNTER — Telehealth: Payer: Self-pay | Admitting: *Deleted

## 2021-12-12 VITALS — BP 132/86 | HR 80 | Resp 18 | Ht 71.0 in | Wt >= 6400 oz

## 2021-12-12 VITALS — BP 111/73 | HR 79 | Temp 98.0°F | Ht 71.0 in | Wt >= 6400 oz

## 2021-12-12 DIAGNOSIS — Z79899 Other long term (current) drug therapy: Secondary | ICD-10-CM

## 2021-12-12 DIAGNOSIS — G4733 Obstructive sleep apnea (adult) (pediatric): Secondary | ICD-10-CM

## 2021-12-12 DIAGNOSIS — K921 Melena: Secondary | ICD-10-CM | POA: Diagnosis not present

## 2021-12-12 DIAGNOSIS — E1169 Type 2 diabetes mellitus with other specified complication: Secondary | ICD-10-CM | POA: Diagnosis not present

## 2021-12-12 DIAGNOSIS — D509 Iron deficiency anemia, unspecified: Secondary | ICD-10-CM

## 2021-12-12 DIAGNOSIS — F331 Major depressive disorder, recurrent, moderate: Secondary | ICD-10-CM

## 2021-12-12 DIAGNOSIS — I10 Essential (primary) hypertension: Secondary | ICD-10-CM

## 2021-12-12 DIAGNOSIS — I480 Paroxysmal atrial fibrillation: Secondary | ICD-10-CM

## 2021-12-12 DIAGNOSIS — Z9989 Dependence on other enabling machines and devices: Secondary | ICD-10-CM

## 2021-12-12 LAB — POCT GLYCOSYLATED HEMOGLOBIN (HGB A1C): HbA1c POC (<> result, manual entry): 5.1 % (ref 4.0–5.6)

## 2021-12-12 NOTE — Patient Instructions (Signed)
Please continue taking medications as prescribed.  Please continue to follow low salt diet and ambulate as tolerated. 

## 2021-12-12 NOTE — Telephone Encounter (Signed)
-----   Message from Erma Heritage, [place] sent at 12/07/2021  2:46 PM EDT ----- Appears labs were ordered by Hematology but resulted to me. Please let the patient know his potassium levels remain low but have improved from last week. If he has been taking K-dur 60 mEq daily, would increase to 80 mEq daily and can divide out to 40 mEq twice daily. Kidney function slightly abnormal when compared to most recent labs.  Please confirm if he has utilized Metolazone recently and confirm how much Torsemide he is currently taking. Repeat BMET in 7-10 days.

## 2021-12-12 NOTE — Progress Notes (Signed)
Referring Provider: [name], MD Primary Care Physician:  [name], MD Primary GI Physician: Jenetta Downer  Chief Complaint  Patient presents with   Follow-up    Patient here today for a follow up appointment. Patient says Dr. Laural Golden told him we could not help him. Patient says he does not know what he is here for. Patient still seeing dark stools.   HPI:   Reginald [name] is a 51 y.o. male with past medical history of severe AS (s/p bovine tissue AVR in 08/2017 with repair of ascending thoracic aortic aneurysm), HFpEF, paroxysmal A fib, CHF (s/p st. Jude PPM placement 08/2017) hx of bilateral PE, HTN, morbid obesity and OSA.   Patient presenting today for follow up of IDA  Last seen in the office in January 2023 for f/u of anemia/melena. Continued with black stools at that time with some BRBPR on occasion, usually a small amount, having 1-2 BMs per day, using stool softeners BID. Repeat iron studies at that time hgb remained low but iron was low normal (Hgb 11.9, TIBC 387, Iron 85, iron sat 22, ferritin 279). Recommended givens capsule study as he had recent EGD and Colonoscopy. Givens capsule study as outlined below. Referred to [facility] for consideration of anterograde double balloon enteroscopy to rule out conditions such as lymphoma.   Hospitalization 07/20/21 for chest pain thought secondary to worsening anemia, heme positive stool and AKI. Hgb 8.3 on admission. Hgb remained stable and patient was discharged with no endoscopic evaluation due to lack of overt GI bleeding.   He underwent enteroscopy at [facility] on 10/27/21 with no specific lesions responsible for his IDA, found.  Last hgb 11.3, ferritin 315,iron 60, TIBC 391, sat 15 on 11/30/21  Currently seeing Oncology/hematology at [facility], initial visit with them was 07/29/21 with initiaion of parenteral iron therapy and further investigation of his IDA to include upcoming PET scan due to findings of enlarge lymph nodes, splenomegaly, enlarged  liver on multiple previous CT imaging.   Present:  He states he is frustrated given no answers have been found. He has had a myriad of tests and no answers. He continues to have intermittent melena. No BRBPR. He has some occasional mid abdominal pain that occurs a few times per week. Reports he has ongoing fatigue and shortness of breath as well. He has had several iron infusions over the past few months, unsure of last one and continued on iron pills. He has a mix of harder, large stools and looser stools. Will usually start off with a larger volume, harder stool, usually once per day, though sometimes he will have multiple episodes per day of looser stools if he has not gone for a day or so. Appetite is not great, he often does not feel hungry. He is feeling very down regarding the inability for anyone to find the cause of his symptoms. He has an upcoming PET scan with hematology/oncology for further evaluation of enlarged lymph nodes/ splenomegaly on multiple previous CTs that no one ever told him about. He tells me he is tired of doing test and does not plan to proceed with any further investigation after PET scan if no findings there.   enteroscopy at [facility] on 10/27/21- Congested mucosa in the stomach. Biopsied. - Congested duodenal mucosa. Biopsied. - The examined portion of the jejunum was normal. - No obvious lesions responsible for iron deficiency  (.duodenum, endoscopic biopsy Duodenal mucosa with lymphoid aggregate, Stomach, endoscopic biopsy:Gastric antral mucosa with mild chronic inactive  gastritis and lymphoid aggregate, see note. Gastric oxyntic mucosa with no significant pathologic diagnosis. No evidence of intestinal metaplasia or dysplasia. No Helicobacter pylori seen on H&E stain. Immunohistochemical stain for Helicobacter species is negative.)  Capsule study: 06/30/21 presence of mild nodularity of the mid small bowel between 1:28 and 1:34  there was no presence of hematin in the GI  tract or presence of vascular lesions, no ulcers.  Stool in the colon was green. No alterations were found that could explain his ongoing fluctuating anemia..  Last Colonoscopy:11/14/20 Dr. Carlean Purl, external and internal hemorrhoids, exam otherwise normal Last Endoscopy: 11/14/20 Dr. Carlean Purl, normal esophagus, normal stomach, normal examined duodenum   Past Medical History:  Diagnosis Date   Anemia    Anxiety    Aortic stenosis    Arthritis    Asthma    Back pain    Cellulitis of skin with lymphangitis    CHF (congestive heart failure) (HCC)    Chronic diastolic congestive heart failure (HCC)    Chronic venous insufficiency    Constipation    COPD (chronic obstructive pulmonary disease) ([place])    Coronary artery disease with history of myocardial infarction without history of CABG    Depression    Diabetes ([place])    Dyspnea    Essential hypertension    Gout    Heart murmur    Hypertension    Morbid obesity ([place])    Obesity    Pneumonia    walking pneumonia   Prostatitis    Pulmonary embolism ([place])    Pulmonary hypertension (HCC)    Pyelonephritis    S/P aortic valve replacement with bioprosthetic valve 08/23/2017   25 mm Edwards Inspiris Resilia stented bovine pericardial tissue valve   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   24 mm Hemashield supracoronary straight graft    Sleep apnea    cpap    Thoracic ascending aortic aneurysm ([place])    Thoracic ascending aortic aneurysm ([employer])    Tobacco abuse     Past Surgical History:  Procedure Laterality Date   AORTIC VALVE REPLACEMENT N/A 08/23/2017   Procedure: AORTIC VALVE REPLACEMENT (AVR) USING INSPIRIS RESILIA AORTIC VALVE SIZE 25 MM;  Surgeon: Rexene Alberts, MD;  Location: [place];  Service: Open Heart Surgery;  Laterality: N/A;   CARDIAC VALVE REPLACEMENT N/A    Phreesia 02/07/2020   COLONOSCOPY WITH PROPOFOL N/A 11/14/2020   Procedure: COLONOSCOPY WITH PROPOFOL;  Surgeon: Gatha Mayer, MD;  Location: [facility]  ENDOSCOPY;  Service: Endoscopy;  Laterality: N/A;   ESOPHAGOGASTRODUODENOSCOPY (EGD) WITH PROPOFOL N/A 11/14/2020   Procedure: ESOPHAGOGASTRODUODENOSCOPY (EGD) WITH PROPOFOL;  Surgeon: Gatha Mayer, MD;  Location: [place];  Service: Endoscopy;  Laterality: N/A;   GIVENS CAPSULE STUDY N/A 06/30/2021   Procedure: GIVENS CAPSULE STUDY;  Surgeon: Harvel Quale, MD;  [facility];  Service: Gastroenterology;  Laterality: N/A;  7:30   MULTIPLE EXTRACTIONS WITH ALVEOLOPLASTY N/A 07/23/2017   Procedure: Extraction of tooth #10 with alveoloplasty and gross debridement of remaining teeth;  Surgeon: Lenn Cal, DDS;  Location: [place];  Service: Oral Surgery;  Laterality: N/A;   NASAL SEPTOPLASTY W/ TURBINOPLASTY Bilateral 07/01/2021   Procedure: NASAL SEPTOPLASTY WITH TURBINATE REDUCTION;  Surgeon: Leta Baptist, MD;  Location: [place];  Service: ENT;  Laterality: Bilateral;   PACEMAKER IMPLANT N/A 08/28/2017    St Jude Medical Assurity MRI conditional  dual-chamber pacemaker for symptomatic complete heart blockby Dr Rayann Heman   TEE WITHOUT CARDIOVERSION N/A 07/16/2017  Procedure: TRANSESOPHAGEAL ECHOCARDIOGRAM (TEE);  Surgeon: Lelon Perla, MD;  Location[facility] ENDOSCOPY;  Service: Cardiovascular;  Laterality: N/A;   TEE WITHOUT CARDIOVERSION N/A 08/23/2017   Procedure: TRANSESOPHAGEAL ECHOCARDIOGRAM (TEE);  Surgeon: Rexene Alberts, MD;  Location: [place];  Service: Open Heart Surgery;  Laterality: N/A;   THORACIC AORTIC ANEURYSM REPAIR N/A 08/23/2017   Procedure: THORACIC ASCENDING ANEURYSM REPAIR (AAA) USING HEMASHIELD GOLD KNITTED MICROVEL DOUBLE VELOUR VASCULAR GRAFT D: 24 MM  L: 30 CM;  Surgeon: Rexene Alberts, MD;  Location: [place];  Service: Open Heart Surgery;  Laterality: N/A;    Current Outpatient Medications  Medication Sig Dispense Refill   acetaminophen (TYLENOL) 325 MG tablet Take 650 mg by mouth every 6 (six) hours as needed for mild pain.     albuterol  (PROVENTIL) (2.5 MG/3ML) 0.083% nebulizer solution INHALE THE CONTENTS OF 1 VIAL VIA NEBULIZER EVERY 6 HOURS AS NEEDED FOR WHEEZING OR SHORTNESS OF BREATH 270 mL 1   albuterol (VENTOLIN HFA) 108 (90 Base) MCG/ACT inhaler INHALE 2 PUFFS INTO THE LUNGS EVERY 6 (SIX) HOURS AS NEEDED FOR WHEEZING OR SHORTNESS OF BREATH. 1 each 1   allopurinol (ZYLOPRIM) 300 MG tablet TAKE 1 TABLET EVERY DAY 90 tablet 3   atorvastatin (LIPITOR) 40 MG tablet TAKE 1 TABLET EVERY DAY 90 tablet 3   Blood Glucose Monitoring Suppl (TRUE METRIX METER) w/Device KIT USE AS DIRECTED 1 kit 0   buPROPion (WELLBUTRIN XL) 300 MG 24 hr tablet Take 1 tablet (300 mg total) by mouth daily. 90 tablet 3   Cholecalciferol 125 MCG (5000 UT) TABS Take 5,000 Units by mouth daily.     docusate sodium (COLACE) 100 MG capsule Take 2 capsules (200 mg total) by mouth 2 (two) times daily. (Patient taking differently: Take 200 mg by mouth daily.) 30 capsule 0   FEROSUL 325 (65 Fe) MG tablet TAKE 1 TABLET EVERY DAY 90 tablet 1   gabapentin (NEURONTIN) 600 MG tablet      hydrOXYzine (ATARAX) 25 MG tablet TAKE 1 TABLET BY MOUTH EVERY 8 HOURS AS NEEDED 90 tablet 0   Krill Oil 350 MG CAPS Take 350 mg by mouth daily.     metFORMIN (GLUCOPHAGE) 850 MG tablet TAKE 1 TABLET TWICE DAILY WITH MEALS 180 tablet 2   metolazone (ZAROXOLYN) 5 MG tablet TAKE 1 TABLET (5 MG TOTAL) BY MOUTH THREE TIMES A WEEK. (Patient taking differently: Take 5 mg by mouth See admin instructions. TAKE 1 TABLET (5 MG TOTAL) BY MOUTH THREE TIMES A WEEK AS NEEDED) 36 tablet 3   metoprolol tartrate (LOPRESSOR) 100 MG tablet TAKE 1 TABLET TWICE DAILY 180 tablet 3   Misc. Devices MISC Sequential compression device - 1 pair. Apply them for 1 hour in the morning and 1 hour in the evening. 2 each 0   ofloxacin (FLOXIN OTIC) 0.3 % OTIC solution Place 5 drops into both ears daily. 5 mL 0   pantoprazole (PROTONIX) 40 MG tablet TAKE 1 TABLET EVERY DAY 90 tablet 3   potassium chloride SA (KLOR-CON  M) 20 MEQ tablet TAKE 3 TABLETS DAILY AND TAKE AN ADDITIONAL 1 TABLET ON THE DAY YOU TAKE YOUR WEEKLY METOLAZONE DOSE (Patient taking differently: daily. TAKE 3 TABLETS DAILY AND TAKE AN ADDITIONAL 1 TABLET ON THE DAY YOU TAKE YOUR WEEKLY METOLAZONE DOSE) 283 tablet 1   rivaroxaban (XARELTO) 20 MG TABS tablet Take 20 mg by mouth daily.     Semaglutide, 2 MG/DOSE, (OZEMPIC, 2 MG/DOSE,) 8 MG/3ML  SOPN Inject 2 mg into the skin once a week.     tamsulosin (FLOMAX) 0.4 MG CAPS capsule Take 1 capsule (0.4 mg total) by mouth 2 (two) times daily. 60 capsule 11   Tiotropium Bromide Monohydrate (SPIRIVA RESPIMAT) 2.5 MCG/ACT AERS Inhale 2 puffs into the lungs daily. 4 g 5   torsemide (DEMADEX) 20 MG tablet TAKE 2 TABLETS TWICE DAILY 360 tablet 1   TRUE METRIX BLOOD GLUCOSE TEST test strip TEST ONE TIME DAILY FOR DIABETES 100 strip 5   TRUEplus Lancets 33G MISC USE TO TEST ONE TIME DAILY FOR DIABETES 100 each 5   TURMERIC-GINGER PO Take 2 each by mouth daily.     Vibegron (GEMTESA) 75 MG TABS Take 75 mg by mouth daily. 28 tablet 0   No current facility-administered medications for this visit.    Allergies as of 12/12/2021   (No Known Allergies)    Family History  Problem Relation Age of Onset   Hypertension Mother    Alzheimer's disease Mother    Heart attack Mother    Heart attack Brother    Diabetes Brother     Social History   Socioeconomic History   Marital status: Divorced    Spouse name: Not on file   Number of children: Not on file   Years of education: Not on file   Highest education level: Not on file  Occupational History   Not on file  Tobacco Use   Smoking status: Former    Packs/day: 2.00    Years: 16.00    Total pack years: 32.00    Types: Cigarettes    Quit date: 08/23/2017    Years since quitting: 4.3   Smokeless tobacco: Never  Vaping Use   Vaping Use: Never used  Substance and Sexual Activity   Alcohol use: Yes    Comment: have had alcohol in the past, not heavy    Drug use: No   Sexual activity: Not Currently    Partners: Female  Other Topics Concern   Not on file  Social History Narrative   Not on file   Social Determinants of Health   Financial Resource Strain: Low Risk  (03/14/2021)   Overall Financial Resource Strain (CARDIA)    Difficulty of Paying Living Expenses: Not hard at all  Food Insecurity: No Food Insecurity (03/14/2021)   Hunger Vital Sign    Worried About Running Out of Food in the Last Year: Never true    [place] in the Last Year: Never true  Transportation Needs: No Transportation Needs (03/14/2021)   PRAPARE - Hydrologist (Medical): No    Lack of Transportation (Non-Medical): No  Physical Activity: Inactive (03/14/2021)   Exercise Vital Sign    Days of Exercise per Week: 0 days    Minutes of Exercise per Session: 0 min  Stress: No Stress Concern Present (03/14/2021)   [place]    Feeling of Stress : Only a little  Social Connections: Socially Isolated (03/14/2021)   Social Connection and Isolation Panel [NHANES]    Frequency of Communication with Friends and Family: Once a week    Frequency of Social Gatherings with Friends and Family: Never    Attends Religious Services: Never    Marine scientist or Organizations: No    Attends Music therapist: More than 4 times per year    Marital Status: Never married  Review of systems General: negative for malaise, night sweats, fever, chills, weight loss Neck: Negative for lumps, goiter, pain and significant neck swelling Resp: Negative for cough, wheezing, dyspnea at rest CV: Negative for chest pain, leg swelling, palpitations, orthopnea GI: denies hematochezia, nausea, vomiting, dysphagia, odyonophagia, early satiety or unintentional weight loss. +melena +intermittent harder stools/loose stools MSK: Negative for joint pain or swelling, back pain,  and muscle pain. Derm: Negative for itching or rash Psych: Denies depression, anxiety, memory loss, confusion. No homicidal or suicidal ideation.  Heme: Negative for prolonged bleeding, bruising easily, and swollen nodes. Endocrine: Negative for cold or heat intolerance, polyuria, polydipsia and goiter. Neuro: negative for tremor, gait imbalance, syncope and seizures. The remainder of the review of systems is noncontributory.  Physical Exam: BP 111/73 (BP Location: Left Arm, Patient Position: Sitting, Cuff Size: Large)   Pulse 79   Temp 98 F (36.7 C) (Oral)   Ht _0  (1.803 m)   Wt (!) 478 lb 6.4 oz (217 kg)   BMI 66.72 kg/m  General:   Alert and oriented. No distress noted. Pleasant and cooperative.  Head:  Normocephalic and atraumatic. Eyes:  Conjuctiva clear without scleral icterus. Mouth:  Oral mucosa pink and moist. Good dentition. No lesions. Heart: Normal rate and rhythm, s1 and s2 heart sounds present.  Lungs: Clear lung sounds in all lobes. Respirations equal and unlabored. Abdomen:  +BS, soft, non-tender and non-distended. No rebound or guarding. No HSM or masses noted. Derm: No palmar erythema or jaundice Msk:  Symmetrical without gross deformities. Normal posture. Extremities:  Without edema. Neurologic:  Alert and  oriented x4 Psych:  Alert and cooperative. Normal mood and affect.  Invalid input(s): "6 MONTHS"   ASSESSMENT: Reginald [name] is a 51 y.o. male presenting today for follow up of melena.  Patient has had extensive endoscopic workup of melena and IDA without findings to explain his symptoms. He is currently undergoing further workup with heme/oncology and has upcoming PET scan for further evaluation of previously noted enlarged lymph nodes and splenomegaly. He continues with intermittent darker stools (is maintained on PO iron) and fatigue. He is very frustrated he cannot seem to get any answers regarding the cause of his symptoms. I had a long discussion  with the patient and verbalized understanding of his frustrations and concerns. At this time, endoscopic evaluation has been exhausted without findings to explain patient's symptoms, agree with PET scan for further evaluation. He should continue to follow with hematology/oncology in regards to workup and management of his IDA.    PLAN:  Continue to follow with Dr. Delton Coombes  2. Pt to make me aware of new or worsening GI symptoms  3. Plenty of water, diet high in fruits, veggies and whole grains to keep stools soft  All questions were answered, patient verbalized understanding and is in agreement with plan as outlined above.   Follow Up: PRN  Tamyrah Burbage L. Alver Sorrow, MSN, APRN, AGNP-C Adult-Gerontology Nurse Practitioner [facility]

## 2021-12-12 NOTE — Assessment & Plan Note (Signed)
Lab Results  Component Value Date   HGBA1C 5.7 (H) 06/28/2021   With peripheral neuropathy Was on Metformin and Ozempic On Atorvastatin 40 mg QD

## 2021-12-12 NOTE — Patient Instructions (Signed)
It was nice to see you, Im sorry that you are having such a rough time, hopefully Dr. Delton Coombes will be able to provide some answers for you, we will plan to see you on as needed basis.

## 2021-12-13 ENCOUNTER — Ambulatory Visit: Payer: Medicare HMO | Admitting: Internal Medicine

## 2021-12-13 DIAGNOSIS — R001 Bradycardia, unspecified: Secondary | ICD-10-CM | POA: Diagnosis not present

## 2021-12-13 DIAGNOSIS — R55 Syncope and collapse: Secondary | ICD-10-CM | POA: Diagnosis not present

## 2021-12-13 DIAGNOSIS — I469 Cardiac arrest, cause unspecified: Secondary | ICD-10-CM | POA: Diagnosis not present

## 2021-12-13 DIAGNOSIS — R000 Tachycardia, unspecified: Secondary | ICD-10-CM | POA: Diagnosis not present

## 2021-12-14 ENCOUNTER — Other Ambulatory Visit (HOSPITAL_COMMUNITY): Payer: Self-pay | Admitting: Hematology

## 2021-12-14 DIAGNOSIS — C8597 Non-Hodgkin lymphoma, unspecified, spleen: Secondary | ICD-10-CM

## 2021-12-14 DIAGNOSIS — D509 Iron deficiency anemia, unspecified: Secondary | ICD-10-CM

## 2021-12-14 DIAGNOSIS — R161 Splenomegaly, not elsewhere classified: Secondary | ICD-10-CM

## 2021-12-14 NOTE — Assessment & Plan Note (Signed)
BP Readings from Last 1 Encounters:  12/12/21 132/86   Well-controlled with Metoprolol and diuretics (for HFrEF) Reports episodes of hypotension at home, could be due to dehydration -although he has chronic LE swelling, his intravascular volume might be low Counseled for compliance with the medications Advised DASH diet and moderate exercise/walking as tolerated

## 2021-12-14 NOTE — Assessment & Plan Note (Signed)
On Metoprolol and Xarelto Followed by Cardiology

## 2021-12-14 NOTE — Assessment & Plan Note (Addendum)
Uncontrolled Likely due to chronic medical conditions On Wellbutrin

## 2021-12-14 NOTE — Assessment & Plan Note (Signed)
Likely OHS and OSA Uses CPAP regularly

## 2021-12-14 NOTE — Progress Notes (Signed)
Established Patient Office Visit  Subjective:  Patient ID: Reginald [name], male    DOB: 12-Aug-1970  Age: 51 y.o. MRN: [id number]  CC:  Chief Complaint  Patient presents with   Follow-up    Follow up pt has been having dizzy spells last two weeks since 11-28-21 also hurts right shoulder today 12-12-21    HPI Reginald [name] is a 51 y.o. male with past medical history of hypertension, aortic stenosis s/p aortic valve replacement and repair of ascending thoracic aortic aneurysm, paroxysmal atrial fibrillation, chronic CHF, h/o pacemaker (complete heart block), OSA on CPAP, COPD and depression who presents for f/u of his chronic medical conditions.  Leg wounds: Has dressing over b/l LE. Has compression socks as well. Followed by wound clinic.  HTN, HFrEF and CKD: His BP is well controlled currently. He reports having dizziness at times as well.  Of note, he takes torsemide and metolazone for HFrEF.  He was recently admitted in the hospital for AKI on CKD, but his kidney function has improved since then with diuretics.  He still complains of exertional dyspnea, which is chronic.  Denies any chest pain currently.  Type II DM: He currently takes metformin and Ozempic. Her HbA1C was 5.1 today. He denies polyphagia or polyuria.   Past Medical History:  Diagnosis Date   Anemia    Anxiety    Aortic stenosis    Arthritis    Asthma    Back pain    Cellulitis of skin with lymphangitis    CHF (congestive heart failure) (HCC)    Chronic diastolic congestive heart failure (HCC)    Chronic venous insufficiency    Constipation    COPD (chronic obstructive pulmonary disease) (HCC)    Coronary artery disease with history of myocardial infarction without history of CABG    Depression    Diabetes (HCC)    Dyspnea    Essential hypertension    Gout    Heart murmur    Hypertension    Morbid obesity ([place])    Obesity    Pneumonia    walking pneumonia   Prostatitis    Pulmonary embolism ([place])     Pulmonary hypertension (HCC)    Pyelonephritis    S/P aortic valve replacement with bioprosthetic valve 08/23/2017   25 mm Edwards Inspiris Resilia stented bovine pericardial tissue valve   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   24 mm Hemashield supracoronary straight graft    Sleep apnea    cpap    Thoracic ascending aortic aneurysm ([place])    Thoracic ascending aortic aneurysm (HCC)    Tobacco abuse     Past Surgical History:  Procedure Laterality Date   AORTIC VALVE REPLACEMENT N/A 08/23/2017   Procedure: AORTIC VALVE REPLACEMENT (AVR) USING INSPIRIS RESILIA AORTIC VALVE SIZE 25 MM;  Surgeon: Rexene Alberts, MD;  Location: [place];  Service: Open Heart Surgery;  Laterality: N/A;   CARDIAC VALVE REPLACEMENT N/A    Phreesia 02/07/2020   COLONOSCOPY WITH PROPOFOL N/A 11/14/2020   Procedure: COLONOSCOPY WITH PROPOFOL;  Surgeon: Gatha Mayer, MD;  Location: [facility] ENDOSCOPY;  Service: Endoscopy;  Laterality: N/A;   ESOPHAGOGASTRODUODENOSCOPY (EGD) WITH PROPOFOL N/A 11/14/2020   Procedure: ESOPHAGOGASTRODUODENOSCOPY (EGD) WITH PROPOFOL;  Surgeon: Gatha Mayer, MD;  Location: [place];  Service: Endoscopy;  Laterality: N/A;   GIVENS CAPSULE STUDY N/A 06/30/2021   Procedure: GIVENS CAPSULE STUDY;  Surgeon: Harvel Quale, MD;  [facility];  Service: Gastroenterology;  Laterality:  N/A;  7:30   MULTIPLE EXTRACTIONS WITH ALVEOLOPLASTY N/A 07/23/2017   Procedure: Extraction of tooth #10 with alveoloplasty and gross debridement of remaining teeth;  Surgeon: Lenn Cal, DDS;  Location: [place];  Service: Oral Surgery;  Laterality: N/A;   NASAL SEPTOPLASTY W/ TURBINOPLASTY Bilateral 07/01/2021   Procedure: NASAL SEPTOPLASTY WITH TURBINATE REDUCTION;  Surgeon: Leta Baptist, MD;  Location: [place];  Service: ENT;  Laterality: Bilateral;   PACEMAKER IMPLANT N/A 08/28/2017    St Jude Medical Assurity MRI conditional  dual-chamber pacemaker for symptomatic complete heart  blockby Dr Rayann Heman   TEE WITHOUT CARDIOVERSION N/A 07/16/2017   Procedure: TRANSESOPHAGEAL ECHOCARDIOGRAM (TEE);  Surgeon: Lelon Perla, MD;  [facility] END[facility]COPY;  Service: Cardiovascular;  Laterality: N/A;   TEE WITHOUT CARDIOVERSION N/A 08/23/2017   Procedure: TRANSESOPHAGEAL ECHOCARDIOGRAM (TEE);  Surgeon: Rexene Alberts, MD;  Location: [place];  Service: Open Heart Surgery;  Laterality: N/A;   THORACIC AORTIC ANEURYSM REPAIR N/A 08/23/2017   Procedure: THORACIC ASCENDING ANEURYSM REPAIR (AAA) USING HEMASHIELD GOLD KNITTED MICROVEL DOUBLE VELOUR VASCULAR GRAFT D: 24 MM  L: 30 CM;  Surgeon: Rexene Alberts, MD;  Location: [place];  Service: Open Heart Surgery;  Laterality: N/A;    Family History  Problem Relation Age of Onset   Hypertension Mother    Alzheimer's disease Mother    Heart attack Mother    Heart attack Brother    Diabetes Brother     Social History   Socioeconomic History   Marital status: Divorced    Spouse name: Not on file   Number of children: Not on file   Years of education: Not on file   Highest education level: Not on file  Occupational History   Not on file  Tobacco Use   Smoking status: Former    Packs/day: 2.00    Years: 16.00    Total pack years: 32.00    Types: Cigarettes    Quit date: 08/23/2017    Years since quitting: 4.3   Smokeless tobacco: Never  Vaping Use   Vaping Use: Never used  Substance and Sexual Activity   Alcohol use: Yes    Comment: have had alcohol in the past, not heavy   Drug use: No   Sexual activity: Not Currently    Partners: Female  Other Topics Concern   Not on file  Social History Narrative   Not on file   Social Determinants of Health   Financial Resource Strain: Low Risk  (03/14/2021)   Overall Financial Resource Strain (CARDIA)    Difficulty of Paying Living Expenses: Not hard at all  Food Insecurity: No Food Insecurity (03/14/2021)   Hunger Vital Sign    Worried About Running Out of Food in the Last  Year: Never true    [place] in the Last Year: Never true  Transportation Needs: No Transportation Needs (03/14/2021)   PRAPARE - Hydrologist (Medical): No    Lack of Transportation (Non-Medical): No  Physical Activity: Inactive (03/14/2021)   Exercise Vital Sign    Days of Exercise per Week: 0 days    Minutes of Exercise per Session: 0 min  Stress: No Stress Concern Present (03/14/2021)   [place]    Feeling of Stress : Only a little  Social Connections: Socially Isolated (03/14/2021)   Social Connection and Isolation Panel [NHANES]    Frequency of Communication with Friends and  Family: Once a week    Frequency of Social Gatherings with Friends and Family: Never    Attends Religious Services: Never    Marine scientist or Organizations: No    Attends Music therapist: More than 4 times per year    Marital Status: Never married  Intimate Partner Violence: Not At Risk (03/14/2021)   Humiliation, Afraid, Rape, and Kick questionnaire    Fear of Current or Ex-Partner: No    Emotionally Abused: No    Physically Abused: No    Sexually Abused: No    Outpatient Medications Prior to Visit  Medication Sig Dispense Refill   acetaminophen (TYLENOL) 325 MG tablet Take 650 mg by mouth every 6 (six) hours as needed for mild pain.     albuterol (PROVENTIL) (2.5 MG/3ML) 0.083% nebulizer solution INHALE THE CONTENTS OF 1 VIAL VIA NEBULIZER EVERY 6 HOURS AS NEEDED FOR WHEEZING OR SHORTNESS OF BREATH 270 mL 1   albuterol (VENTOLIN HFA) 108 (90 Base) MCG/ACT inhaler INHALE 2 PUFFS INTO THE LUNGS EVERY 6 (SIX) HOURS AS NEEDED FOR WHEEZING OR SHORTNESS OF BREATH. 1 each 1   allopurinol (ZYLOPRIM) 300 MG tablet TAKE 1 TABLET EVERY DAY 90 tablet 3   atorvastatin (LIPITOR) 40 MG tablet TAKE 1 TABLET EVERY DAY 90 tablet 3   Blood Glucose Monitoring Suppl (TRUE METRIX METER) w/Device KIT  USE AS DIRECTED 1 kit 0   buPROPion (WELLBUTRIN XL) 300 MG 24 hr tablet Take 1 tablet (300 mg total) by mouth daily. 90 tablet 3   Cholecalciferol 125 MCG (5000 UT) TABS Take 5,000 Units by mouth daily.     docusate sodium (COLACE) 100 MG capsule Take 2 capsules (200 mg total) by mouth 2 (two) times daily. (Patient taking differently: Take 200 mg by mouth daily.) 30 capsule 0   FEROSUL 325 (65 Fe) MG tablet TAKE 1 TABLET EVERY DAY 90 tablet 1   gabapentin (NEURONTIN) 300 MG capsule Take 300 mg by mouth 3 (three) times daily.     hydrOXYzine (ATARAX) 25 MG tablet TAKE 1 TABLET BY MOUTH EVERY 8 HOURS AS NEEDED 90 tablet 0   Krill Oil 350 MG CAPS Take 350 mg by mouth daily.     metFORMIN (GLUCOPHAGE) 850 MG tablet TAKE 1 TABLET TWICE DAILY WITH MEALS (Patient taking differently: Take 850 mg by mouth daily with breakfast.) 180 tablet 2   metolazone (ZAROXOLYN) 5 MG tablet TAKE 1 TABLET (5 MG TOTAL) BY MOUTH THREE TIMES A WEEK. (Patient taking differently: Take 5 mg by mouth See admin instructions. TAKE 1 TABLET (5 MG TOTAL) BY MOUTH THREE TIMES A WEEK AS NEEDED) 36 tablet 3   metoprolol tartrate (LOPRESSOR) 100 MG tablet TAKE 1 TABLET TWICE DAILY 180 tablet 3   Misc. Devices MISC Sequential compression device - 1 pair. Apply them for 1 hour in the morning and 1 hour in the evening. 2 each 0   pantoprazole (PROTONIX) 40 MG tablet TAKE 1 TABLET EVERY DAY 90 tablet 3   potassium chloride SA (KLOR-CON M) 20 MEQ tablet TAKE 3 TABLETS DAILY AND TAKE AN ADDITIONAL 1 TABLET ON THE DAY YOU TAKE YOUR WEEKLY METOLAZONE DOSE (Patient taking differently: 100 mEq daily. TAKE 3 TABLETS DAILY AND TAKE AN ADDITIONAL 1 TABLET ON THE DAY YOU TAKE YOUR WEEKLY METOLAZONE DOSE) 283 tablet 1   rivaroxaban (XARELTO) 20 MG TABS tablet Take 20 mg by mouth daily.     Semaglutide, 2 MG/DOSE, (OZEMPIC, 2 MG/DOSE,) 8 MG/3ML SOPN  Inject 2 mg into the skin once a week.     tamsulosin (FLOMAX) 0.4 MG CAPS capsule Take 1 capsule (0.4 mg  total) by mouth 2 (two) times daily. (Patient taking differently: Take 0.4 mg by mouth in the morning and at bedtime.) 60 capsule 11   Tiotropium Bromide Monohydrate (SPIRIVA RESPIMAT) 2.5 MCG/ACT AERS Inhale 2 puffs into the lungs daily. 4 g 5   torsemide (DEMADEX) 20 MG tablet TAKE 2 TABLETS TWICE DAILY (Patient taking differently: Take 40 mg by mouth daily.) 360 tablet 1   TRUE METRIX BLOOD GLUCOSE TEST test strip TEST ONE TIME DAILY FOR DIABETES 100 strip 5   TRUEplus Lancets 33G MISC USE TO TEST ONE TIME DAILY FOR DIABETES 100 each 5   Vibegron (GEMTESA) 75 MG TABS Take 75 mg by mouth daily. 28 tablet 0   No facility-administered medications prior to visit.    No Known Allergies  ROS Review of Systems  Constitutional:  Positive for fatigue. Negative for chills and fever.  HENT:  Negative for congestion, sinus pressure and sinus pain.   Eyes:  Negative for pain and redness.  Respiratory:  Positive for cough and shortness of breath.   Cardiovascular:  Positive for leg swelling. Negative for chest pain and palpitations.  Gastrointestinal:  Positive for constipation. Negative for diarrhea and vomiting.  Endocrine: Negative for polydipsia and polyuria.  Genitourinary:  Negative for dysuria and hematuria.  Musculoskeletal:  Positive for arthralgias and back pain. Negative for neck pain and neck stiffness.  Skin:  Positive for wound.  Neurological:  Positive for dizziness, weakness and headaches. Negative for syncope.  Psychiatric/Behavioral:  Positive for dysphoric mood and sleep disturbance. Negative for agitation and behavioral problems. The patient is nervous/anxious.       Objective:    Physical Exam Vitals reviewed.  Constitutional:      General: He is not in acute distress.    Appearance: He is obese. He is not diaphoretic.     Comments: uses cane for walking support  HENT:     Head: Normocephalic and atraumatic.     Ears:     Comments: Bloody discharge noted in external  ear cannot    Nose: Nose normal.     Mouth/Throat:     Mouth: Mucous membranes are moist.  Eyes:     General: No scleral icterus.    Extraocular Movements: Extraocular movements intact.  Cardiovascular:     Rate and Rhythm: Normal rate and regular rhythm.     Pulses: Normal pulses.     Heart sounds: Murmur (Systolic over b/l upper sternal borders) heard.  Pulmonary:     Breath sounds: No wheezing or rales.  Abdominal:     Palpations: Abdomen is soft.     Tenderness: There is no abdominal tenderness.  Musculoskeletal:     Cervical back: Neck supple. No tenderness.     Right lower leg: Edema (2+) present.     Left lower leg: Edema (2+) present.  Skin:    General: Skin is warm.     Findings: Rash (stasis dermatitis over b/l LE) present.     Comments: Dressing over right LE - C/D/I  Neurological:     General: No focal deficit present.     Mental Status: He is alert and oriented to person, place, and time.     Sensory: No sensory deficit.     Motor: Weakness (4/5 in b/l LE) present.  Psychiatric:        Mood  and Affect: Mood is depressed. Affect is tearful.     BP 132/86 (BP Location: Right Arm, Patient Position: Sitting, Cuff Size: Normal)   Pulse 80   Resp 18   Ht _0  (1.803 m)   Wt (!) 476 lb 9.6 oz (216.2 kg)   SpO2 97%   BMI 66.47 kg/m  Wt Readings from Last 3 Encounters:  12/12/21 (!) 476 lb 9.6 oz (216.2 kg)  12/12/21 (!) 478 lb 6.4 oz (217 kg)  12/07/21 (!) 458 lb 6.4 oz (207.9 kg)    Lab Results  Component Value Date   TSH 3.920 05/13/2021   Lab Results  Component Value Date   WBC 9.8 11/30/2021   HGB 11.3 (L) 11/30/2021   HCT 36.7 (L) 11/30/2021   MCV 84.0 11/30/2021   PLT 264 11/30/2021   Lab Results  Component Value Date   NA 138 12/07/2021   K 3.0 (L) 12/07/2021   CO2 31 12/07/2021   GLUCOSE 117 (H) 12/07/2021   BUN 29 (H) 12/07/2021   CREATININE 1.49 (H) 12/07/2021   BILITOT 2.3 (H) 11/30/2021   ALKPHOS 85 11/30/2021   AST 38  11/30/2021   ALT 16 11/30/2021   PROT 8.3 (H) 11/30/2021   ALBUMIN 4.0 11/30/2021   CALCIUM 9.5 12/07/2021   ANIONGAP 13 12/07/2021   EGFR 74 07/05/2021   Lab Results  Component Value Date   CHOL 97 03/25/2021   Lab Results  Component Value Date   HDL 27 (L) 03/25/2021   Lab Results  Component Value Date   LDLCALC 42 03/25/2021   Lab Results  Component Value Date   TRIG 138 03/25/2021   Lab Results  Component Value Date   CHOLHDL 3.6 03/25/2021   Lab Results  Component Value Date   HGBA1C 5.1 12/12/2021      Assessment & Plan:   Problem List Items Addressed This Visit       Cardiovascular and Mediastinum   Essential hypertension - Primary (Chronic)    BP Readings from Last 1 Encounters:  12/12/21 132/86  Well-controlled with Metoprolol and diuretics (for HFrEF) Reports episodes of hypotension at home, could be due to dehydration -although he has chronic LE swelling, his intravascular volume might be low Counseled for compliance with the medications Advised DASH diet and moderate exercise/walking as tolerated      Paroxysmal atrial fibrillation (HCC)    On Metoprolol and Xarelto Followed by Cardiology        Respiratory   OSA on CPAP    Likely OHS and OSA Uses CPAP regularly        Endocrine   DM2 (diabetes mellitus, type 2) ([place])    Lab Results  Component Value Date   HGBA1C 5.1 12/12/2021  With peripheral neuropathy Was on Metformin and Ozempic On Atorvastatin 40 mg QD      Relevant Orders   POCT glycosylated hemoglobin (Hb A1C) (Completed)     Other   Depression, major, recurrent ([place])    Uncontrolled Likely due to chronic medical conditions On Wellbutrin       No orders of the defined types were placed in this encounter.   Follow-up: Return in about 5 months (around 05/14/2022) for Annual physical.    [name], MD

## 2021-12-15 ENCOUNTER — Ambulatory Visit: Payer: Medicare HMO | Admitting: Orthopedic Surgery

## 2021-12-15 ENCOUNTER — Encounter (HOSPITAL_COMMUNITY): Admission: RE | Admit: 2021-12-15 | Payer: Medicare HMO | Source: Ambulatory Visit

## 2021-12-16 NOTE — Progress Notes (Signed)
Remote pacemaker transmission.   

## 2021-12-19 DIAGNOSIS — I5032 Chronic diastolic (congestive) heart failure: Secondary | ICD-10-CM

## 2021-12-19 DIAGNOSIS — J449 Chronic obstructive pulmonary disease, unspecified: Secondary | ICD-10-CM

## 2021-12-20 DEATH — deceased

## 2021-12-22 ENCOUNTER — Ambulatory Visit (HOSPITAL_COMMUNITY): Payer: Medicare HMO | Admitting: Hematology

## 2021-12-22 ENCOUNTER — Ambulatory Visit: Payer: Medicare HMO | Admitting: Orthopedic Surgery

## 2021-12-25 ENCOUNTER — Other Ambulatory Visit: Payer: Self-pay | Admitting: Cardiovascular Disease

## 2021-12-26 ENCOUNTER — Other Ambulatory Visit: Payer: Self-pay | Admitting: Internal Medicine

## 2022-01-05 ENCOUNTER — Telehealth: Payer: Self-pay | Admitting: Internal Medicine

## 2022-01-05 NOTE — Telephone Encounter (Signed)
Sent email to [facility] nurse

## 2022-01-20 ENCOUNTER — Encounter: Payer: Medicare HMO | Admitting: Internal Medicine

## 2022-02-28 ENCOUNTER — Ambulatory Visit: Payer: Medicare HMO | Admitting: Student

## 2022-03-10 ENCOUNTER — Ambulatory Visit: Payer: Medicare HMO | Admitting: Urology
